# Patient Record
Sex: Female | Born: 1955 | Race: Black or African American | Hispanic: No | Smoking: Never smoker
Health system: Southern US, Community
[De-identification: ages and names within clinical notes are randomized; demographics above are authoritative.]

## PROBLEM LIST (undated history)

## (undated) ENCOUNTER — Ambulatory Visit

## (undated) ENCOUNTER — Telehealth

## (undated) ENCOUNTER — Encounter: Attending: Nephrology | Primary: Nephrology

## (undated) ENCOUNTER — Encounter

## (undated) ENCOUNTER — Ambulatory Visit: Payer: MEDICARE | Attending: Nephrology | Primary: Nephrology

## (undated) ENCOUNTER — Encounter: Attending: Physician Assistant | Primary: Physician Assistant

## (undated) ENCOUNTER — Encounter: Attending: Surgery | Primary: Surgery

## (undated) ENCOUNTER — Ambulatory Visit: Payer: MEDICARE

## (undated) ENCOUNTER — Telehealth
Attending: Student in an Organized Health Care Education/Training Program | Primary: Student in an Organized Health Care Education/Training Program

## (undated) ENCOUNTER — Telehealth: Attending: Certified Registered" | Primary: Certified Registered"

## (undated) ENCOUNTER — Encounter: Attending: Certified Registered" | Primary: Certified Registered"

## (undated) ENCOUNTER — Encounter
Attending: Pharmacist Clinician (PhC)/ Clinical Pharmacy Specialist | Primary: Pharmacist Clinician (PhC)/ Clinical Pharmacy Specialist

## (undated) ENCOUNTER — Encounter
Attending: Student in an Organized Health Care Education/Training Program | Primary: Student in an Organized Health Care Education/Training Program

## (undated) ENCOUNTER — Ambulatory Visit: Payer: Medicare (Managed Care) | Attending: Nephrology | Primary: Nephrology

## (undated) DIAGNOSIS — T82898A Other specified complication of vascular prosthetic devices, implants and grafts, initial encounter: Secondary | ICD-10-CM

## (undated) DIAGNOSIS — Z853 Personal history of malignant neoplasm of breast: Secondary | ICD-10-CM

## (undated) DIAGNOSIS — N649 Disorder of breast, unspecified: Secondary | ICD-10-CM

## (undated) DIAGNOSIS — N042 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: Secondary | ICD-10-CM

## (undated) DIAGNOSIS — K7290 Hepatic failure, unspecified without coma: Secondary | ICD-10-CM

## (undated) DIAGNOSIS — M5136 Other intervertebral disc degeneration, lumbar region: Secondary | ICD-10-CM

## (undated) DIAGNOSIS — M75102 Unspecified rotator cuff tear or rupture of left shoulder, not specified as traumatic: Secondary | ICD-10-CM

## (undated) DIAGNOSIS — K7460 Unspecified cirrhosis of liver: Secondary | ICD-10-CM

## (undated) DIAGNOSIS — I10 Essential (primary) hypertension: Secondary | ICD-10-CM

## (undated) DIAGNOSIS — C801 Malignant (primary) neoplasm, unspecified: Secondary | ICD-10-CM

## (undated) DIAGNOSIS — M549 Dorsalgia, unspecified: Secondary | ICD-10-CM

## (undated) DIAGNOSIS — R600 Localized edema: Secondary | ICD-10-CM

## (undated) DIAGNOSIS — F1010 Alcohol abuse, uncomplicated: Secondary | ICD-10-CM

## (undated) DIAGNOSIS — M51369 Other intervertebral disc degeneration, lumbar region without mention of lumbar back pain or lower extremity pain: Secondary | ICD-10-CM

## (undated) DIAGNOSIS — Z973 Presence of spectacles and contact lenses: Secondary | ICD-10-CM

## (undated) DIAGNOSIS — J189 Pneumonia, unspecified organism: Secondary | ICD-10-CM

## (undated) DIAGNOSIS — G8929 Other chronic pain: Secondary | ICD-10-CM

## (undated) DIAGNOSIS — K5790 Diverticulosis of intestine, part unspecified, without perforation or abscess without bleeding: Secondary | ICD-10-CM

## (undated) DIAGNOSIS — G4730 Sleep apnea, unspecified: Secondary | ICD-10-CM

## (undated) DIAGNOSIS — R9389 Abnormal findings on diagnostic imaging of other specified body structures: Principal | ICD-10-CM

## (undated) DIAGNOSIS — I671 Cerebral aneurysm, nonruptured: Secondary | ICD-10-CM

## (undated) DIAGNOSIS — N183 Chronic kidney disease, stage 3 (moderate): Secondary | ICD-10-CM

## (undated) DIAGNOSIS — R160 Hepatomegaly, not elsewhere classified: Secondary | ICD-10-CM

## (undated) DIAGNOSIS — N052 Unspecified nephritic syndrome with diffuse membranous glomerulonephritis: Secondary | ICD-10-CM

## (undated) HISTORY — DX: Sleep apnea, unspecified: G47.30

## (undated) HISTORY — PX: CATARACT EXTRACTION: SUR2

## (undated) HISTORY — PX: KNEE ARTHROSCOPY: SHX127

## (undated) HISTORY — PX: BRAIN SURGERY: SHX531

## (undated) HISTORY — PX: ROTATOR CUFF REPAIR: SHX139

## (undated) HISTORY — PX: KIDNEY SURGERY: SHX687

## (undated) HISTORY — DX: Abnormal findings on diagnostic imaging of other specified body structures: R93.89

## (undated) HISTORY — DX: Disorder of breast, unspecified: N64.9

## (undated) HISTORY — PX: NEPHRECTOMY TRANSPLANTED ORGAN: SUR880

## (undated) HISTORY — DX: Personal history of malignant neoplasm of breast: Z85.3

## (undated) HISTORY — PX: TOTAL KNEE ARTHROPLASTY: SHX125

## (undated) HISTORY — PX: BREAST SURGERY: SHX581

## (undated) HISTORY — PX: LIVER TRANSPLANT: SHX410

## (undated) HISTORY — DX: Unspecified cirrhosis of liver: K74.60

## (undated) HISTORY — PX: EYE SURGERY: SHX253

## (undated) HISTORY — PX: CHOLECYSTECTOMY: SHX55

---
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
## 1996-10-30 DIAGNOSIS — I671 Cerebral aneurysm, nonruptured: Secondary | ICD-10-CM

## 1996-10-30 HISTORY — DX: Cerebral aneurysm, nonruptured: I67.1

## 1998-01-28 ENCOUNTER — Encounter: Admission: RE | Admit: 1998-01-28 | Discharge: 1998-04-28 | Payer: Self-pay | Admitting: *Deleted

## 2000-10-30 DIAGNOSIS — C801 Malignant (primary) neoplasm, unspecified: Secondary | ICD-10-CM

## 2000-10-30 HISTORY — PX: BREAST LUMPECTOMY: SHX2

## 2000-10-30 HISTORY — DX: Malignant (primary) neoplasm, unspecified: C80.1

## 2001-06-18 ENCOUNTER — Encounter: Admission: RE | Admit: 2001-06-18 | Discharge: 2001-08-22 | Payer: Self-pay | Admitting: *Deleted

## 2001-12-25 ENCOUNTER — Encounter: Admission: RE | Admit: 2001-12-25 | Discharge: 2002-01-14 | Payer: Self-pay | Admitting: Orthopaedic Surgery

## 2004-06-01 ENCOUNTER — Inpatient Hospital Stay (HOSPITAL_COMMUNITY): Admission: EM | Admit: 2004-06-01 | Discharge: 2004-06-02 | Payer: Self-pay | Admitting: Internal Medicine

## 2005-01-02 ENCOUNTER — Ambulatory Visit: Payer: Self-pay | Admitting: Cardiology

## 2005-01-09 ENCOUNTER — Inpatient Hospital Stay (HOSPITAL_BASED_OUTPATIENT_CLINIC_OR_DEPARTMENT_OTHER): Admission: RE | Admit: 2005-01-09 | Discharge: 2005-01-09 | Payer: Self-pay | Admitting: Cardiology

## 2005-01-09 ENCOUNTER — Ambulatory Visit: Payer: Self-pay | Admitting: Cardiology

## 2005-01-26 ENCOUNTER — Ambulatory Visit: Payer: Self-pay | Admitting: Cardiology

## 2007-05-16 ENCOUNTER — Ambulatory Visit (HOSPITAL_COMMUNITY): Admission: RE | Admit: 2007-05-16 | Discharge: 2007-05-16 | Payer: Self-pay | Admitting: Neurosurgery

## 2008-09-17 ENCOUNTER — Ambulatory Visit (HOSPITAL_COMMUNITY): Admission: RE | Admit: 2008-09-17 | Discharge: 2008-09-17 | Payer: Self-pay | Admitting: Ophthalmology

## 2008-11-05 ENCOUNTER — Ambulatory Visit (HOSPITAL_COMMUNITY): Admission: RE | Admit: 2008-11-05 | Discharge: 2008-11-05 | Payer: Self-pay | Admitting: Ophthalmology

## 2008-12-03 ENCOUNTER — Ambulatory Visit (HOSPITAL_COMMUNITY): Admission: RE | Admit: 2008-12-03 | Discharge: 2008-12-04 | Payer: Self-pay | Admitting: Orthopedic Surgery

## 2008-12-09 ENCOUNTER — Encounter: Admission: RE | Admit: 2008-12-09 | Discharge: 2009-03-09 | Payer: Self-pay | Admitting: Orthopedic Surgery

## 2011-02-14 LAB — COMPREHENSIVE METABOLIC PANEL
ALT: 63 U/L — ABNORMAL HIGH (ref 0–35)
Albumin: 4.1 g/dL (ref 3.5–5.2)
CO2: 27 mEq/L (ref 19–32)
Calcium: 9.4 mg/dL (ref 8.4–10.5)
Chloride: 104 mEq/L (ref 96–112)
GFR calc Af Amer: >60 mL/min (ref >60)
GFR calc non Af Amer: >60 mL/min (ref >60)
Potassium: 3.5 mEq/L (ref 3.5–5.1)
Sodium: 140 mEq/L (ref 135–145)

## 2011-02-14 LAB — CBC
RBC: 4.78 MIL/uL (ref 3.87–5.11)
RDW: 14.3 % (ref 11.5–15.5)
WBC: 9 10*3/uL (ref 4.0–10.5)

## 2011-02-14 LAB — URINALYSIS, ROUTINE W REFLEX MICROSCOPIC
Protein, ur: NEGATIVE mg/dL
Urobilinogen, UA: 1 mg/dL (ref 0.0–1.0)
pH: 6.5 (ref 5.0–8.0)

## 2011-03-14 NOTE — Op Note (Signed)
NAMEJESMINE, DURRAH NO.:  [id number]   MEDICAL RECORD NO.:  [id number]          PATIENT TYPE:  OIB   LOCATION:  5041                         FACILITY:  [facility]   PHYSICIAN:  Metta Clines. Supple, M.D.  DATE OF BIRTH:  19-Jun-1956   DATE OF PROCEDURE:  12/03/2008  DATE OF DISCHARGE:                               OPERATIVE REPORT   PREOPERATIVE DIAGNOSES:  1. Chronic left shoulder impingement syndrome.  2. Left shoulder acromioclavicular joint arthrosis.   POSTOPERATIVE DIAGNOSES:  1. Chronic left shoulder impingement syndrome.  2. Left shoulder acromioclavicular joint arthrosis.  3. Partial articular rotator cuff tear.  4. Complex labral tear.   PROCEDURE:  1. Left shoulder examination under anesthesia.  2. Left shoulder diagnostic arthroscopy.  3. Debridement of labral tear.  4. Debridement of partial articular rotator cuff tear.  5. Arthroscopic subacromial depression and bursectomy.  6. Arthroscopic distal clavicle resection.   SURGEON:  Metta Clines. Supple, MD   ASSISTANT:  Jenetta Loges, PA-C   ANESTHESIA:  General endotracheal as well as preop interscalene blocks.   BLOOD LOSS:  Minimal.   DRAINS:  None.   HISTORY:  Ms. Coral is a 55 year old female who has had chronic left  shoulder pain with weakness and restrictions in mobility and symptoms  that have been refractory to attempts at conservative management.  Her  examination reveals painful arc of shoulder motion with positive  impingement sign and radiographs show evidence for [facility] joint arthrosis.  Due to ongoing pain and functional limitation, she is brought to the  operating room at this time for planned left shoulder arthroscopy as  described below.   Preoperatively counseled Ms. Christina on treatment options as well as  risks versus benefits thereof.  Possible surgical complications  including bleeding, infection, neurovascular injury, persistent pain,  loss of motion, anesthetic  complication, and possible need for  additional surgery are reviewed.  She understands and accepts and agrees  with our planned procedure.   PROCEDURE IN DETAIL:  After undergoing routine preop evaluation, the  patient received prophylactic antibiotics and an interscalene block was  established in the holding area by the Anesthesia Department.  Placed on  the operating table and underwent smooth induction of general  endotracheal anesthesia.  Turned to the right lateral decubitus position  on beanbag and appropriately padded and protected.  Left shoulder  examination under anesthesia revealed full passive motion.  There were  no obvious instability patterns.  The left arm suspended at 70 degrees  abduction with 10 pounds of traction.  Left shoulder region was  sterilely prepped and draped in standard fashion.  A posterior portal  was established in the glenohumeral joint and anterior portal was  established under direct visualization.  The glenohumeral articular  surfaces were in good condition.  There was extensive tearing of the  superior half of the labrum, and these areas were debrided with a  shaver.  The biceps tendon was stable with normal caliber and no  evidence for instability proximally and distally.  The rotator cuff  showed a partial articular tear involving the distal insertion of the  supraspinatus.  I would estimate that this constituted 30-40% thickness  of the tendon.  This area was debrided from the articular side to a rim  of healthy appearing tissue.  We did pass a tag suture of 0 PDS at this  point.  Remaining inspection of the glenohumeral joint showed no obvious  additional intra-articular pathology.  At this point, fluid and  instruments were then removed.  The arm was dropped down to 30 degrees  of abduction with the arthroscope introduced in the subacromial space.  Posterior portal and anterolateral portals were established in the  subacromial space.  Abundant  proliferative bursal tissue and multiple  adhesions were identified and these were then divided and excised with  combination the shaver and Arthrex wand.  The wand was then used to  remove the periosteum from the undersurface of the anterior half of the  acromion.  A subacromial depression was then performed with a bur  creating a type 1 morphology.  A portal was then established directly  anterior to the distal clavicle and the distal clavicle resection was  performed with a bur.  Care was taken to make sure that the entire  circumference of the distal clavicle could be visualized to ensure  adequate removal of bone.  Of note, the [facility] joint was markedly  degenerated.  We then completed the subacromial/subdeltoid bursectomy.  We carefully inspected rotator cuff from the bursal side and the region  of the tag suture was carefully probed and did not identify any  significant degeneration and certainly no full-thickness defects and the  probe could not be passed through the cuff.  There was noted to be some  increased vascularity and hemorrhage in the area surrounding the tag  suture but again the thickness of the remaining portion of tissues  suggested good healing potential.  The tag suture was then removed.  We  completed the subacromial/subdeltoid bursectomy.  Hemostasis was  obtained.  Fluid and instruments were removed.  The portals were closed  with Monocryl and Steri-Strips.  A bulky dry dressing taped to the left  foot.  Left arm was placed in sling immobilizer.  The patient was  supine, extubated, and taken to recovery room in stable condition.      Metta Clines. Supple, M.D.  Electronically Signed     KMS/MEDQ  D:  12/03/2008  T:  12/03/2008  Job:  [id number]

## 2011-03-17 NOTE — H&P (Signed)
NAME:  Savannah Jackson, Savannah Jackson NO.:  [id number]   MEDICAL RECORD NO.:  [id number]                   PATIENT TYPE:  INP   LOCATION:  6524                                 FACILITY:  [facility]   PHYSICIAN:  Deboraha Sprang, M.D.               DATE OF BIRTH:  October 24, 1956   DATE OF ADMISSION:  06/01/2004  DATE OF DISCHARGE:                                HISTORY & PHYSICAL   HISTORY OF PRESENT ILLNESS:  Savannah Jackson is referred from [facility] because of atypical chest pain and abnormal electrocardiogram.   Savannah Jackson is a 55 year old lady with cardiac risk factors notable for  hypertension, in the context of significant obesity, who presents with the  abrupt onset today of severe chest pain, noted to be epigastric and mid-  sternal, radiating around the left side to her back.  It is markedly  aggravated by movement and respirations, and in fact, if she lies perfectly  still, it is absent.  It is accompanied by shortness of breath.  She had  significant chest pain on exertion.  However, at baseline, accompanied only  by mild peripheral edema.   She was admitted to the hospital.  There her electrocardiogram has had some  changes which actually have improved as the day has gone one, perhaps  parallel in her improved blood pressure and potentially causative  associated.  She ruled out for myocardial infarction at least on her initial  enzymes.   PAST MEDICAL HISTORY:  Notable for a brain aneurysm for which she underwent  surgery in 1995.  She has had knee replacement surgery, and a couple of  other knee surgeries.  She has had a history of a cholecystectomy.  She also  has a history of breast cancer.  She is status post left lobectomy, followed  by chemotherapy and radiation therapy.   REVIEW OF SYSTEMS:  Notable for symptoms of sleep apnea and arthritis.  She  has some tingling in her right greater than left hand.   SOCIAL HISTORY:  She is married.  She  has one child and four grandchildren.  She does not use cigarettes or recreational drugs.  She drinks socially.   ALLERGIES:  She has no known allergies.   MEDICATIONS:  She does not take any medications on a regular basis.   PHYSICAL EXAMINATION:  VITAL SIGNS:  Her blood pressure is 120/72, pulses  69, respirations 14.  She is 180 pounds. She is 5 feet, 3 inches.  She is in  no acute distress.  GENERAL:  She is a middle-aged [place] American female.  HEENT:  Demonstrated no icterus or xanthomas.  NECK:  The neck veins were flat.  The carotids were brisk and full  bilaterally without bruits.  BACK:  Without kyphosis or scoliosis.  LUNGS:  Clear.  HEART:  Sounds were regular without murmurs or gallops.  ABDOMEN:  Soft with active bowel sounds  without midline pulsation or  hepatomegaly.  PULSES:  The femoral pulses were 2+.  Distal pulses were intact.  EXTREMITIES:  There was no clubbing, cyanosis or edema.  There as a scar  over her right knee.  NEUROLOGIC:  Exam was grossly normal.   STUDIES:  Electrocardiogram demonstrates sinus rhythm at 59 __________ 0.19/  0.09/ 0.43.  This was electrocardiogram obtained at 1848 hours.  There is  evidence of left ventricular hypertrophy and repolarization abnormalities.  As noted, the electrocardiographic abnormalities were significant worse upon  presentation at 9 o'clock this morning, with gradual improvement, but still  markedly abnormal with a strain pattern.   LABORATORY DATA:  Notable for a glucose of only 112.  Her enzymes were  normal as noted.  These were obtained at 15:10.  I presume that her first  set of enzymes at 9 o'clock were normal, and indeed, that turns out to be  the case.  Her hemogram was normal.   IMPRESSION:  1. Atypical chest pain evident only with movement and respiration.  2. Thromboembolic risk factors.     A. Recent car trip.     B. Remote breast cancer.     C. Relative inactivity.  3. Question metabolic  syndrome.     A. Obesity.     B. Hypertension.     C. Question HDL.  4. Probable sleep apnea.  5. Abnormal electrocardiogram with LVH with repolarization.  6. History of cerebral aneurysm, status post clipping.  7. Dyspnea on exertion.   PROGNOSIS:  Savannah Jackson has a variety of issues, the most important of  which tonight is what is the cause of her chest pain, and does she have  pulmonary embolism.   We will undertake a CT scan tonight as I am a little bit reluctant to use  anticoagulation with her cerebral aneurysm clipping history, although I have  spoken with neurosurgery, and they said that there should be no  contraindications.   We will also plan to measure an HDL to see if she fits criteria for the  metabolic syndrome.   Upon discharge, she will need evaluation for obstructive sleep apnea.  We  obtained an outpatient echocardiogram and a Cardiolite to look at function  and perfusion, and degree of hypertrophy.                                                Deboraha Sprang, M.D.    SCK/MEDQ  D:  06/01/2004  T:  06/02/2004  Job:  [id number]   cc:   [name] Bears  [place]  [place] [address]  Fax: [place]

## 2011-03-17 NOTE — Discharge Summary (Signed)
Savannah Jackson, Savannah Jackson NO.:  [id number]   MEDICAL RECORD NO.:  [id number]                   PATIENT TYPE:  INP   LOCATION:  [id number]                                 FACILITY:  [facility]   PHYSICIAN:  Deboraha Sprang, M.D.               DATE OF BIRTH:  02-26-1956   DATE OF ADMISSION:  06/01/2004  DATE OF DISCHARGE:  06/02/2004                           DISCHARGE SUMMARY - REFERRING   PROCEDURE:  Chest CT scan.   REASON FOR ADMISSION:  Please refer to dictated admission note.   LABORATORY DATA:  CPK 74/1.2, troponin I 0.02, sodium 132, potassium 3.3,  glucose 100, BUN 9, creatinine 1.0.  CBC normal. D-dimer 0.29.  Fasting  lipids pending. Elevated ALT 43 and AST 52, otherwise normal liver enzymes.   HOSPITAL COURSE:  Following transfer from [facility] where patient  presented with atypical chest pain exacerbated by movement and inspiration  in the context of negative serial cardiac markers, but abnormal  electrocardiograms, the patient was maintained on medication regimen  consisting of aspirin, beta blocker and Mavik.   Dr. Caryl Comes recommended further studies consisting of a CT scan of the chest.  This was negative for pulmonary embolism.  A D-dimer was also negative.  Followup with serial cardiac markers remained negative as well.   Mild hypokalemia was noted and repleted prior to discharge.   Further evaluation will proceed as recommended by Caryl Comes and consist of a 2-D  echocardiogram for exclusion of left ventricular hypertrophy, stress  Cardiolite, and arrangements for outpatient sleep study to assess for  possible sleep apnea.   Dr. Caryl Comes also wondered whether the patient has metabolic syndrome given her  hypertension and obesity.  A fasting lipid profile was pending.   The patient will also need followup of mildly elevated liver enzymes.   DISCHARGE MEDICATIONS:  1. Mavik 1 mg daily.  2. Lopressor 25 mg b.i.d.   INSTRUCTIONS:  1. Followup  with Gene Serpe, P.A.-C at [facility] in [place] on Friday     August 12 at 1 p.m.  2. Arrangements will be made for patient to have an outpatient 2-D     echocardiogram, stress Cardiolite, complete metabolic panel for followup     of hypokalemia and elevated liver enzymes.  3. The patient is instructed to arrange followup with Dr. Elita Boone in     approximately two weeks with whom she will establish as her primary care     physician.   DISCHARGE DIAGNOSES:  1. Atypical chest pain.  2. Hypertension.  3. Hypokalemia.  4. Mildly elevated liver enzymes.  5. Obesity.  6. Question of sleep apnea.      Gene Serpe, P.A. [facility]                      Deboraha Sprang, M.D.    GS/MEDQ  D:  06/02/2004  T:  06/02/2004  Job:  [id number]   cc:   [name] Bears  [address]  [place]  [place] [address]  Fax: [phone]

## 2011-03-17 NOTE — Discharge Summary (Signed)
NAMEMACII, ARD NO.:  [id number]   MEDICAL RECORD NO.:  [id number]                   PATIENT TYPE:  INP   LOCATION:  [id number]                                 FACILITY:  [facility]   PHYSICIAN:  Deboraha Sprang, M.D.               DATE OF BIRTH:  04-01-1956   DATE OF ADMISSION:  06/01/2004  DATE OF DISCHARGE:  06/02/2004                           DISCHARGE SUMMARY - REFERRING   ADDENDUM   Forward a copy of Job [id number] to [facility] in [place], [address] [place], [place], [place], [employer].      Gene Serpe, P.A. [facility]                      Deboraha Sprang, M.D.    GS/MEDQ  D:  06/02/2004  T:  06/02/2004  Job:  [id number]

## 2011-03-17 NOTE — Cardiovascular Report (Signed)
Savannah Jackson, CASTLES NO.:  [id number]   MEDICAL RECORD NO.:  [id number]          PATIENT TYPE:  OIB   LOCATION:  6501                         FACILITY:  [facility]   PHYSICIAN:  Ernestine Mcmurray, M.D. LHCDATE OF BIRTH:  [birth year]   DATE OF PROCEDURE:  01/09/2005  DATE OF DISCHARGE:  01/09/2005                              CARDIAC CATHETERIZATION   REFERRING PHYSICIAN:  Satira Sark, M.D.   OPERATOR:  Ernestine Mcmurray, M.D.   PROCEDURES PERFORMED:  1.  Left and right heart catheterization with selective coronary      angiography.  2.  Ventriculography.   DIAGNOSES:  1.  No evidence of significant flow-limiting epicardial coronary artery      disease.  2.  Mild pulmonary hypertension.  3.  Hypertensive heart disease.   INDICATIONS:  Patient is a 55 year old female with a history of progressive  dyspnea. The patient has been referred for a cardiac catheterization to rule  out underlying coronary artery disease and pulmonary hypertension.   DESCRIPTION OF PROCEDURE:  After informed consent was signed, the patient  was brought to the catheterization lab.  The right groin was sterilely  prepped and draped.  A 4 French arterial sheath was placed using modified  Seldinger technique.  A 7 French venous sheath was passed into place, using  the same technique.  Left heart catheterization was performed with a 4  [place] JL4 and JR4 catheters using manual injection of contrast.  Coronary  arteries were evaluated in multiple projections.  The right heart  catheterization was performed with a standard Swan-Ganz catheter.  Right-  sided hemodynamics were obtained as well as assessment of cardiac output  using thermodilution method.  At the termination of the procedure, all  catheter sheaths were removed.  The patient was brought back to the recovery  area.  No complications were encountered.   FINDINGS:  1.  HEMODYNAMICS:  Right atrial pressure 5 mmHg.  Right ventricular  pressure      35/4 mmHg.  Pulmonary artery pressure 75/20 mmHg.  Pulmonary capillary      wedge pressure 15 mmHg.  Left ventricular pressure 153/26 mmHg.  Aortic      pressure 156/96 mmHg.  Cardiac outflow 1.6 liters per minute.  Cardiac      index 2.95 liters per minute using thermodilution method.  2.  VENTRICULOGRAPHY:  Ejection fraction 60% with segmental wall motion      abnormalities.  No mitral regurgitation.  3.  SELECTIVE CORONARY ANGIOGRAPHY:  The coronary circulation was      codominant.   1.  Left main coronary artery was a large caliber vessel with no evidence of      filling disease.  2.  LAD had no focal stenosis.  The diagonal branches were also free of      focal stenotic lesions.  3.  The circumflex coronary artery demonstrated no evidence of focal or      diffuse stenosis.  4.  The right coronary artery was a large caliber vessel, codominant.  There      was no evidence of flow-limiting lesion  in the distribution of the right      coronary artery.   RECOMMENDATIONS:  The patient will need maximal treatment for hypertension.  She appears to have hypertensive heart disease.  Also, obstructive sleep  apnea should be ruled out.  Followup will be provided by Dr. Woody Seller.  There is  no evidence of significant flow-limiting epicardial coronary artery disease.  Nor is the patient's pulmonary hypertension an immediate cause for her  dyspnea.       GED/MEDQ  D:  05/04/2005  T:  05/04/2005  Job:  [id number]   cc:   Satira Sark, M.D. [facility]

## 2011-08-01 LAB — BASIC METABOLIC PANEL
CO2: 31
Calcium: 9.8
Chloride: 98
GFR calc non Af Amer: >60
Glucose, Bld: 112 — ABNORMAL HIGH

## 2011-08-01 LAB — HEMOGLOBIN AND HEMATOCRIT, BLOOD
HCT: 46.2 — ABNORMAL HIGH
Hemoglobin: 15.6 — ABNORMAL HIGH

## 2011-08-01 LAB — GLUCOSE, CAPILLARY: Glucose-Capillary: 125 — ABNORMAL HIGH

## 2011-08-04 LAB — BASIC METABOLIC PANEL
CO2: 30 mEq/L (ref 19–32)
Calcium: 9.2 mg/dL (ref 8.4–10.5)
Chloride: 99 mEq/L (ref 96–112)
GFR calc Af Amer: >60 mL/min (ref >60)
GFR calc non Af Amer: >60 mL/min (ref >60)
Sodium: 137 mEq/L (ref 135–145)

## 2012-04-02 ENCOUNTER — Encounter (HOSPITAL_COMMUNITY): Payer: Self-pay | Admitting: *Deleted

## 2012-04-02 ENCOUNTER — Emergency Department (HOSPITAL_COMMUNITY)
Admission: EM | Admit: 2012-04-02 | Discharge: 2012-04-02 | Disposition: A | Discharge status: Home / Self Care | Payer: Medicare PPO | Attending: Emergency Medicine | Admitting: Emergency Medicine

## 2012-04-02 ENCOUNTER — Emergency Department (HOSPITAL_COMMUNITY): Payer: Medicare PPO

## 2012-04-02 DIAGNOSIS — I10 Essential (primary) hypertension: Secondary | ICD-10-CM | POA: Insufficient documentation

## 2012-04-02 DIAGNOSIS — M545 Low back pain, unspecified: Secondary | ICD-10-CM

## 2012-04-02 DIAGNOSIS — R112 Nausea with vomiting, unspecified: Secondary | ICD-10-CM | POA: Insufficient documentation

## 2012-04-02 HISTORY — DX: Essential (primary) hypertension: I10

## 2012-04-02 HISTORY — DX: Unspecified rotator cuff tear or rupture of left shoulder, not specified as traumatic: M75.102

## 2012-04-02 HISTORY — DX: Other intervertebral disc degeneration, lumbar region without mention of lumbar back pain or lower extremity pain: M51.369

## 2012-04-02 HISTORY — DX: Dorsalgia, unspecified: M54.9

## 2012-04-02 HISTORY — DX: Cerebral aneurysm, nonruptured: I67.1

## 2012-04-02 HISTORY — DX: Other chronic pain: G89.29

## 2012-04-02 HISTORY — DX: Other intervertebral disc degeneration, lumbar region: M51.36

## 2012-04-02 HISTORY — DX: Malignant (primary) neoplasm, unspecified: C80.1

## 2012-04-02 MED ORDER — ONDANSETRON 8 MG PO TBDP
8.0000 mg | ORAL_TABLET | Freq: Once | ORAL | Status: AC
  Administered 2012-04-02: 8 mg via ORAL
  Filled 2012-04-02: qty 1

## 2012-04-02 MED ORDER — ONDANSETRON HCL 4 MG PO TABS: 4.0000 mg | ORAL_TABLET | Freq: Three times a day (TID) | ORAL | Status: AC | PRN

## 2012-04-02 MED ORDER — OXYCODONE-ACETAMINOPHEN 5-325 MG PO TABS: ORAL_TABLET | ORAL | Status: AC

## 2012-04-02 MED ORDER — DIPHENHYDRAMINE HCL 25 MG PO CAPS
50.0000 mg | ORAL_CAPSULE | Freq: Once | ORAL | Status: AC
  Administered 2012-04-02: 50 mg via ORAL
  Filled 2012-04-02: qty 2

## 2012-04-02 MED ORDER — METHOCARBAMOL 500 MG PO TABS: 1000.0000 mg | ORAL_TABLET | Freq: Four times a day (QID) | ORAL | Status: AC | PRN

## 2012-04-02 MED ORDER — DIAZEPAM 5 MG PO TABS
5.0000 mg | ORAL_TABLET | Freq: Once | ORAL | Status: AC
  Administered 2012-04-02: 5 mg via ORAL
  Filled 2012-04-02: qty 1

## 2012-04-02 MED ORDER — MORPHINE SULFATE 4 MG/ML IJ SOLN
4.0000 mg | Freq: Once | INTRAMUSCULAR | Status: AC
  Administered 2012-04-02: 4 mg via INTRAMUSCULAR
  Filled 2012-04-02 (×2): qty 1

## 2012-04-02 MED ORDER — OXYCODONE-ACETAMINOPHEN 5-325 MG PO TABS
2.0000 | ORAL_TABLET | Freq: Once | ORAL | Status: AC
  Administered 2012-04-02: 2 via ORAL
  Filled 2012-04-02: qty 2

## 2012-04-02 NOTE — ED Provider Notes (Signed)
History   This chart was scribed for Alfonzo Feller, DO by Kathreen Cornfield. The patient was seen in room APA18/APA18 and the patient's care was started at 11:00 AM     CSN: [id number]  Arrival date & time 04/02/12  1006   First MD Initiated Contact with Patient 04/02/12 1049      Chief Complaint  Patient presents with  . Back Pain    HPI  Savannah Jackson is a 56 y.o. female who presents to the Emergency Department complaining of gradual onset and persistence of constant acute flair of her chronic low back "pain" for the past several days.  Denies any change in her usual chronic pain pattern for the past several years.  States she has had several episodes of N/V that began after taking her pain medicine (naprosyn) "on an empty stomach."  Denies incont/retention of bowel or bladder, no saddle anesthesia, no focal motor weakness, no tingling/numbness in extremities, no fevers, no injury, no abd pain, no diarrhea, no dysuria/hematuria.   The symptoms have been associated with no other complaints. The patient has a significant history of similar symptoms previously.      PCP is Dr. Legrand Rams.    Past Medical History  Diagnosis Date  . Hypertension   . Brain aneurysm   . Cancer   . Rotator cuff syndrome of left shoulder   . DDD (degenerative disc disease), lumbar   . Chronic back pain     Past Surgical History  Procedure Date  . Cholecystectomy   . Brain surgery   . Breast surgery   . Total knee arthroplasty     right      History  Substance Use Topics  . Smoking status: Never Smoker   . Smokeless tobacco: Not on file  . Alcohol Use: Yes     occasioinally    Review of Systems ROS: Statement: All systems negative except as marked or noted in the HPI; Constitutional: Negative for fever and chills. ; ; Eyes: Negative for eye pain, redness and discharge. ; ; ENMT: Negative for ear pain, hoarseness, nasal congestion, sinus pressure and sore throat. ; ; Cardiovascular: Negative  for chest pain, palpitations, diaphoresis, dyspnea and peripheral edema. ; ; Respiratory: Negative for cough, wheezing and stridor. ; ; Gastrointestinal: +N/V. Negative for diarrhea, abdominal pain, blood in stool, hematemesis, jaundice and rectal bleeding. . ; ; Genitourinary: Negative for dysuria, flank pain and hematuria. ; ; Musculoskeletal: +LBP. Negative for neck pain. Negative for swelling and trauma.; ; Skin: Negative for pruritus, rash, abrasions, blisters, bruising and skin lesion.; ; Neuro: Negative for headache, lightheadedness and neck stiffness. Negative for weakness, altered level of consciousness , altered mental status, extremity weakness, paresthesias, involuntary movement, seizure and syncope.     Allergies  Review of patient's allergies indicates no known allergies.  Home Medications   Current Outpatient Rx  Name Route Sig Dispense Refill  . HYDROCHLOROTHIAZIDE 25 MG PO TABS Oral Take 25 mg by mouth 2 (two) times daily.    Marland Kitchen NAPROXEN SODIUM 220 MG PO TABS Oral Take 220 mg by mouth every 8 (eight) hours as needed. For pain    . POLYVINYL ALCOHOL 1.4 % OP SOLN Both Eyes Place 1 drop into both eyes as needed. For dry eyes    . METHOCARBAMOL 500 MG PO TABS Oral Take 2 tablets (1,000 mg total) by mouth 4 (four) times daily as needed (muscle spasm/pain). 25 tablet 0  . OXYCODONE-ACETAMINOPHEN 5-325 MG PO TABS  1 or 2 tabs PO q6h prn pain 20 tablet 0    BP 178/89  Pulse 63  Temp(Src) 98.2 F (36.8 C) (Oral)  Resp 18  Ht 5\' 3"  (1.6 m)  Wt 157 lb (71.215 kg)  BMI 27.81 kg/m2  SpO2 91%  Physical Exam 1105: Physical examination:  Nursing notes reviewed; Vital signs and O2 SAT reviewed;  Constitutional: Well developed, Well nourished, Well hydrated, In no acute distress; Head:  Normocephalic, atraumatic; Eyes: EOMI, PERRL, No scleral icterus; ENMT: Mouth and pharynx normal, Mucous membranes moist; Neck: Supple, Full range of motion, No lymphadenopathy; Cardiovascular: Regular  rate and rhythm, No murmur or gallop; Respiratory: Breath sounds clear & equal bilaterally, No rales, rhonchi, wheezes, speaking full sentences with ease. Normal respiratory effort/excursion; Chest: Nontender, Movement normal; Abdomen: Soft, Nontender, Nondistended, Normal bowel sounds; Genitourinary: No CVA tenderness;  Spine:  No midline CS, TS, LS tenderness.  +TTP right lower lumbar paraspinal muscles and SI joint areas which reproduces pt's pain.; Extremities: Pulses normal, No tenderness, No edema, No calf edema or asymmetry.; Neuro: AA&Ox3, Major CN grossly intact. Strength 5/5 equal bilat UE's and LE's, including great toe dorsiflexion.  DTR 2/4 equal bilat UE's and LE's.  No gross sensory deficits.  Neg straight leg raises bilat.; Skin: Color normal, Warm, Dry   ED Course  Procedures    MDM  MDM Reviewed: nursing note, vitals and previous chart Reviewed previous: CT scan Interpretation: x-ray   Dg Lumbar Spine Complete 04/02/2012  *RADIOLOGY REPORT*  Clinical Data: Low back pain radiating to right hip  LUMBAR SPINE - COMPLETE 4+ VIEW  Comparison: None Correlation:  Lumbar spine myelography 05/16/2007  Findings: Five non-rib bearing lumbar vertebrae. Question slightly decreased osseous mineralization. Mild facet degenerative changes lower lumbar spine. Straightening of lumbar lordosis. Vertebral body heights maintained. Minimal disc space narrowing and endplate spur formation L3-L4. No acute fracture, subluxation or bone destruction. SI joints symmetric. No spondylolysis identified.  IMPRESSION: Minimal degenerative changes lower lumbar spine. No acute osseous findings.  Original Report Authenticated By: Burnetta Sabin, M.D.       2:22 PM:  Pt feels "better now" and wants to go home.  Pt is now sitting up on the stretcher, taking PO without difficulty after initially vomiting pain meds and valium.  Pt has long hx of chronic LBP with EPIC chart review revealing that pt was seen by  Neurosurgeon Cabbell and had CT myelogram in 2008 for this same LBP.  Pt endorses this pain is per her usual today.  No concerning signs of cauda equina.  Dx testing d/w pt and family.  Questions answered.  Verb understanding, agreeable to d/c home with outpt f/u.       I personally performed the services described in this documentation, which was scribed in my presence. The recorded information has been reviewed and considered. [place], DO 04/05/12 1231

## 2012-04-02 NOTE — Discharge Instructions (Signed)
RESOURCE GUIDE  Chronic Pain Problems: Contact [facility]  [phone] Patients need to be referred by their primary care doctor.  Insufficient Money for Medicine: Contact United Way:  call "211" or [facility] [phone].  No Primary Care Doctor: - Call [facility]  [phone] - can help you locate a primary care doctor that  accepts your insurance, provides certain services, etc. - Physician Referral Service- 1-800-533-3463  Agencies that provide inexpensive medical care: - [facility]  [phone] - [facility]  [phone] - [facility]  [phone] - [facility]  [phone] - Planned Parenthood  [phone] - [facility]  [phone]  Medicaid-accepting [place] Providers: - [facility]- [address]  [phone], Mon-Fri 9am-7pm, Sat 9am-1pm - [facility]- [address], [address]  [phone] - [facility]- [address], [address]  [phone] - [facility]- [address] [place] [address]  [phone] - Veita Bland- [address], [phone]  Only accepts [place] Access Medicaid patients after they have their name  applied to their card  Self Pay (no insurance) in [place]: - Sickle Cell Patients: Dr Eric Dean, [facility]  [address], [phone] - [facility]- [address]  [phone]       -     [facility] [place]- [address]       -     [facility]- see information above (Speak to Pam H if you do not have insurance)       -  [facility]- [address], [phone]       -  [facility] [place]- [address],  [phone]       -  [facility]- [address] [place] [address], [phone]       -  Dr Osei-Bonsu-  [address], [place], [phone]       -  [facility]- [address], [phone]       -  [facility]- [address] [place] [address], [phone], also [address], [phone]       -    [facility]- [address], [phone], 1st & 3rd Saturday   every month, 10am-1pm  1) Find a Doctor and Pay Out of Pocket Although you won't have to find out who is covered by your insurance plan, it is a good idea to ask around and get recommendations. You will then need to call the office and see if the doctor you have chosen will accept you as a new patient and what types of options they offer for patients who are self-pay. Some doctors offer discounts or will set up payment plans for their patients who do not have insurance, but you will need to ask so you aren't surprised when you get to your appointment.  2) Contact Your Local Health Department Not all health departments have doctors that can see patients for sick visits, but many do, so it is worth a call to see if yours does. If you don't know where your local health department is, you can check in your phone book. The CDC also has a tool to help you locate your state's health department, and many state websites also have   listings of all of their local health departments.  3) Find a Walk-in Clinic If your illness is not likely to be very severe or complicated, you may want to try a walk in clinic. These are popping up all over the country in pharmacies, drugstores, and shopping centers. They're usually staffed by nurse practitioners or physician assistants that have been trained to treat common illnesses and complaints. They're usually fairly quick and inexpensive. However, if you have serious medical issues or chronic medical problems, these are probably not your best option  STD Testing - [place] [facility] [place], [facility] Clinic, [address], [place], phone [phone] or 1-877-539-9860.  Monday - Friday, call for an appointment. - [place]  [facility] [place], [facility]D Clinic, [address], [place], phone [phone] or 1-877-539-9860.  Monday - Friday, call for an appointment.  Abuse/Neglect: - [place] Child Abuse Hotline (336) 641-3795 - [place] Child Abuse Hotline 800-378-5315 (After Hours)  Emergency Shelter:  [facility] (336) 271-5985  Maternity Homes: - [facility] (336) 275-9566 - [facility] (704) 372-4663  MRSA Hotline #:   832-7006  [place] Resources  [facility] [place]  [employer] [place] [facility] [address]                 [address]         [address]  [place]                                               [place]                              [place] Phone:  [phone]                                  Phone:  [phone]                   Phone:  [phone]  [place] [facility], [phone] - [place] Services - [facility]- 1-888-581-9988       -     [facility] in [place], [address],                                  336-349-4454, Insurance  [place] Child Abuse Hotline (336) 342-1394 or (336) 342-3537 (After Hours)   Behavioral Health Services  Substance Abuse Resources: - [facility]  336-882-2125 - [facility] 336-784-9470 - [facility] 336-285-9073 - [facility] 336-845-3988 - Residential & Outpatient Substance Abuse Program  800-659-3381  Psychological Services: - [facility]  832-9600 - [facility]  [phone] - [place] [facility], [address], [place], ACCESS LINE: 1-800-853-5163 or 336-641-4981, Http://www.[facility].com/services/adult.htm  Dental Assistance  If unable to pay or uninsured, contact:  [facility] or [place] [facility] to become qualified for the adult dental  clinic.  Patients with Medicaid: [facility] [address], [phone], [phone]  If unable   to pay, or uninsured, contact [facility] 3161752786) or [facility] (979)042-0832 in [place], [place] in [facility]) to become qualified for the adult dental clinic  Other [place]- [place], [place], [place], [address], [place], [place], 2nd and 4th Thursday of the month at 6:30am.  10 clients each day by appointment, can sometimes see walk-in patients if someone does not show for an appointment. [facility]- [address] [place], [place], [address], [place], [place], [place], [address], [place] Department- [place] [phone]     Take the prescriptions as directed.  Apply moist heat or ice to the area(s) of discomfort, for 15 minutes at a time, several times per day for the next few days.  Do not fall asleep on a heating or ice pack.  Call your regular medical doctor and your Neurosurgeon today to schedule a follow up appointment this week.  Return to the Emergency Department immediately if worsening.

## 2012-04-02 NOTE — ED Notes (Signed)
Patient vomited while getting up to get in wheelchair. EDP made aware, prescription given for zofran.

## 2012-04-02 NOTE — ED Notes (Signed)
Patient c/o itching. Patient states "I think that morphine is causing it." EDP made aware, order given.

## 2012-04-02 NOTE — ED Notes (Signed)
Pt c/o lower back pain radiating down to her right hip since yesterday. Denies injury.

## 2012-04-02 NOTE — ED Notes (Signed)
Patient actively vomiting small amount of clear emesis. EDP made aware, verbal order given.

## 2012-05-08 ENCOUNTER — Other Ambulatory Visit (HOSPITAL_COMMUNITY): Payer: Self-pay | Admitting: Internal Medicine

## 2012-05-08 DIAGNOSIS — M549 Dorsalgia, unspecified: Secondary | ICD-10-CM

## 2012-05-13 ENCOUNTER — Ambulatory Visit (HOSPITAL_COMMUNITY): Payer: Medicare HMO

## 2012-07-15 ENCOUNTER — Emergency Department (HOSPITAL_COMMUNITY): Payer: Medicare HMO

## 2012-07-15 ENCOUNTER — Encounter (HOSPITAL_COMMUNITY): Payer: Self-pay

## 2012-07-15 ENCOUNTER — Inpatient Hospital Stay (HOSPITAL_COMMUNITY)
Admission: EM | Admit: 2012-07-15 | Discharge: 2012-07-18 | DRG: 433 | Disposition: A | Discharge status: Home / Self Care | Payer: Medicare HMO | Attending: Internal Medicine | Admitting: Internal Medicine

## 2012-07-15 DIAGNOSIS — F411 Generalized anxiety disorder: Secondary | ICD-10-CM | POA: Diagnosis present

## 2012-07-15 DIAGNOSIS — E876 Hypokalemia: Secondary | ICD-10-CM | POA: Diagnosis present

## 2012-07-15 DIAGNOSIS — K7030 Alcoholic cirrhosis of liver without ascites: Principal | ICD-10-CM | POA: Diagnosis present

## 2012-07-15 DIAGNOSIS — Z23 Encounter for immunization: Secondary | ICD-10-CM

## 2012-07-15 DIAGNOSIS — R112 Nausea with vomiting, unspecified: Secondary | ICD-10-CM

## 2012-07-15 DIAGNOSIS — K7010 Alcoholic hepatitis without ascites: Secondary | ICD-10-CM | POA: Diagnosis present

## 2012-07-15 DIAGNOSIS — F10939 Alcohol use, unspecified with withdrawal, unspecified: Secondary | ICD-10-CM

## 2012-07-15 DIAGNOSIS — F10239 Alcohol dependence with withdrawal, unspecified: Secondary | ICD-10-CM

## 2012-07-15 DIAGNOSIS — Z7982 Long term (current) use of aspirin: Secondary | ICD-10-CM

## 2012-07-15 DIAGNOSIS — M51379 Other intervertebral disc degeneration, lumbosacral region without mention of lumbar back pain or lower extremity pain: Secondary | ICD-10-CM | POA: Diagnosis present

## 2012-07-15 DIAGNOSIS — Z96659 Presence of unspecified artificial knee joint: Secondary | ICD-10-CM

## 2012-07-15 DIAGNOSIS — Z91040 Latex allergy status: Secondary | ICD-10-CM

## 2012-07-15 DIAGNOSIS — I10 Essential (primary) hypertension: Secondary | ICD-10-CM | POA: Diagnosis present

## 2012-07-15 DIAGNOSIS — F1020 Alcohol dependence, uncomplicated: Secondary | ICD-10-CM | POA: Diagnosis present

## 2012-07-15 DIAGNOSIS — M5137 Other intervertebral disc degeneration, lumbosacral region: Secondary | ICD-10-CM | POA: Diagnosis present

## 2012-07-15 LAB — CBC WITH DIFFERENTIAL/PLATELET
Eosinophils Relative: 0 % (ref 0–5)
HCT: 46.8 % — ABNORMAL HIGH (ref 36.0–46.0)
Lymphocytes Relative: 9 % — ABNORMAL LOW (ref 12–46)
Lymphs Abs: 0.6 10*3/uL — ABNORMAL LOW (ref 0.7–4.0)
MCH: 31.1 pg (ref 26.0–34.0)
MCV: 89 fL (ref 78.0–100.0)
Monocytes Relative: 7 % (ref 3–12)
Neutro Abs: 5.7 10*3/uL (ref 1.7–7.7)
Neutrophils Relative %: 82 % — ABNORMAL HIGH (ref 43–77)
Platelets: 106 10*3/uL — ABNORMAL LOW (ref 150–400)
Platelets: 102 10*3/uL — ABNORMAL LOW (ref 150–400)
RBC: 4.82 MIL/uL (ref 3.87–5.11)
RDW: 14.7 % (ref 11.5–15.5)
WBC: 7 10*3/uL (ref 4.0–10.5)
WBC: 6.6 10*3/uL (ref 4.0–10.5)

## 2012-07-15 LAB — BASIC METABOLIC PANEL
CO2: 24 mEq/L (ref 19–32)
Calcium: 9 mg/dL (ref 8.4–10.5)
Chloride: 99 mEq/L (ref 96–112)
Creatinine, Ser: 0.49 mg/dL — ABNORMAL LOW (ref 0.50–1.10)
GFR calc Af Amer: >90 mL/min (ref >90)
Glucose, Bld: 168 mg/dL — ABNORMAL HIGH (ref 70–99)

## 2012-07-15 LAB — URINALYSIS, ROUTINE W REFLEX MICROSCOPIC
Ketones, ur: NEGATIVE mg/dL
Leukocytes, UA: NEGATIVE
Nitrite: NEGATIVE
Protein, ur: 100 mg/dL — AB
Urobilinogen, UA: 2 mg/dL — ABNORMAL HIGH (ref 0.0–1.0)

## 2012-07-15 LAB — COMPREHENSIVE METABOLIC PANEL
AST: 118 U/L — ABNORMAL HIGH (ref 0–37)
Albumin: 3.1 g/dL — ABNORMAL LOW (ref 3.5–5.2)
Calcium: 8.9 mg/dL (ref 8.4–10.5)
Creatinine, Ser: 0.49 mg/dL — ABNORMAL LOW (ref 0.50–1.10)
GFR calc non Af Amer: >90 mL/min (ref >90)
Total Protein: 7.5 g/dL (ref 6.0–8.3)

## 2012-07-15 LAB — URINE MICROSCOPIC-ADD ON

## 2012-07-15 MED ORDER — HYDROMORPHONE HCL PF 1 MG/ML IJ SOLN
1.0000 mg | Freq: Once | INTRAMUSCULAR | Status: AC
  Administered 2012-07-15: 1 mg via INTRAVENOUS
  Filled 2012-07-15: qty 1

## 2012-07-15 MED ORDER — POTASSIUM CHLORIDE 10 MEQ/100ML IV SOLN
10.0000 meq | Freq: Once | INTRAVENOUS | Status: AC
  Administered 2012-07-15: 10 meq via INTRAVENOUS
  Filled 2012-07-15: qty 100

## 2012-07-15 MED ORDER — LORAZEPAM 1 MG PO TABS: 0.0000 mg | ORAL_TABLET | Freq: Two times a day (BID) | ORAL | Status: DC

## 2012-07-15 MED ORDER — METOPROLOL TARTRATE 50 MG PO TABS
50.0000 mg | ORAL_TABLET | Freq: Two times a day (BID) | ORAL | Status: DC
  Administered 2012-07-15 – 2012-07-18 (×6): 50 mg via ORAL
  Filled 2012-07-15 (×6): qty 1

## 2012-07-15 MED ORDER — LORAZEPAM 1 MG PO TABS
0.0000 mg | ORAL_TABLET | Freq: Four times a day (QID) | ORAL | Status: AC
  Administered 2012-07-15: 2 mg via ORAL
  Administered 2012-07-15 – 2012-07-17 (×6): 1 mg via ORAL
  Filled 2012-07-15 (×5): qty 1
  Filled 2012-07-15: qty 2
  Filled 2012-07-15: qty 1

## 2012-07-15 MED ORDER — HYDROMORPHONE HCL PF 1 MG/ML IJ SOLN
1.0000 mg | INTRAMUSCULAR | Status: AC | PRN
  Administered 2012-07-15: 1 mg via INTRAVENOUS
  Filled 2012-07-15: qty 1

## 2012-07-15 MED ORDER — LORAZEPAM 2 MG/ML IJ SOLN
1.0000 mg | Freq: Once | INTRAMUSCULAR | Status: AC
  Administered 2012-07-15: 1 mg via INTRAVENOUS
  Filled 2012-07-15: qty 1

## 2012-07-15 MED ORDER — ONDANSETRON HCL 4 MG/2ML IJ SOLN
4.0000 mg | Freq: Three times a day (TID) | INTRAMUSCULAR | Status: AC | PRN
  Administered 2012-07-15: 4 mg via INTRAVENOUS
  Filled 2012-07-15: qty 2

## 2012-07-15 MED ORDER — VITAMIN B-1 100 MG PO TABS
100.0000 mg | ORAL_TABLET | Freq: Every day | ORAL | Status: DC
  Administered 2012-07-16 – 2012-07-18 (×3): 100 mg via ORAL
  Filled 2012-07-15 (×3): qty 1

## 2012-07-15 MED ORDER — THIAMINE HCL 100 MG/ML IJ SOLN
100.0000 mg | Freq: Every day | INTRAMUSCULAR | Status: DC
  Administered 2012-07-15: 100 mg via INTRAVENOUS
  Filled 2012-07-15: qty 2

## 2012-07-15 MED ORDER — SODIUM CHLORIDE 0.9 % IV SOLN
INTRAVENOUS | Status: AC
  Administered 2012-07-15: 21:00:00 via INTRAVENOUS

## 2012-07-15 MED ORDER — ENOXAPARIN SODIUM 40 MG/0.4ML ~~LOC~~ SOLN
40.0000 mg | SUBCUTANEOUS | Status: DC
  Administered 2012-07-15 – 2012-07-17 (×3): 40 mg via SUBCUTANEOUS
  Filled 2012-07-15 (×3): qty 0.4

## 2012-07-15 MED ORDER — SODIUM CHLORIDE 0.9 % IV SOLN
Freq: Once | INTRAVENOUS | Status: AC
  Administered 2012-07-15: 12:00:00 via INTRAVENOUS

## 2012-07-15 MED ORDER — IOHEXOL 300 MG/ML  SOLN
100.0000 mL | Freq: Once | INTRAMUSCULAR | Status: AC | PRN
  Administered 2012-07-15: 100 mL via INTRAVENOUS

## 2012-07-15 MED ORDER — DEXTROSE-NACL 5-0.9 % IV SOLN
INTRAVENOUS | Status: DC
  Administered 2012-07-15: 23:00:00 via INTRAVENOUS

## 2012-07-15 MED ORDER — SODIUM CHLORIDE 0.9 % IV BOLUS (SEPSIS)
1000.0000 mL | Freq: Once | INTRAVENOUS | Status: AC
  Administered 2012-07-15: 1000 mL via INTRAVENOUS

## 2012-07-15 MED ORDER — ONDANSETRON HCL 4 MG/2ML IJ SOLN
4.0000 mg | Freq: Once | INTRAMUSCULAR | Status: AC
  Administered 2012-07-15: 4 mg via INTRAVENOUS
  Filled 2012-07-15: qty 2

## 2012-07-15 MED ORDER — FOLIC ACID 1 MG PO TABS
1.0000 mg | ORAL_TABLET | Freq: Every day | ORAL | Status: DC
  Administered 2012-07-15 – 2012-07-18 (×4): 1 mg via ORAL
  Filled 2012-07-15 (×4): qty 1

## 2012-07-15 MED ORDER — ADULT MULTIVITAMIN W/MINERALS CH
1.0000 | ORAL_TABLET | Freq: Every day | ORAL | Status: DC
  Administered 2012-07-15 – 2012-07-18 (×4): 1 via ORAL
  Filled 2012-07-15 (×4): qty 1

## 2012-07-15 MED ORDER — INFLUENZA VIRUS VACC SPLIT PF IM SUSP
0.5000 mL | INTRAMUSCULAR | Status: AC
  Administered 2012-07-16: 0.5 mL via INTRAMUSCULAR
  Filled 2012-07-15: qty 0.5

## 2012-07-15 MED ORDER — ONDANSETRON HCL 4 MG/2ML IJ SOLN: 4.0000 mg | Freq: Four times a day (QID) | INTRAMUSCULAR | Status: DC | PRN

## 2012-07-15 NOTE — ED Notes (Signed)
Pt arrived by ems from home, stated she quit drinking alcohol last Wednesday has been drinking everyday since last December, began vomiting this am, noted streaks of blood, after coughing. Denies any pain.  Has been having tremors for several days. Has taken no meds for tremors. Has not been to her pmd for any check-ups. Denies any fever w/ cough

## 2012-07-15 NOTE — ED Notes (Signed)
Pt returned from ct scan, sats 88-89% on room air, 02 applied at 2 l by nasal cannula, denies pain at this time.

## 2012-07-15 NOTE — ED Notes (Signed)
Pt resting in bed, normal rise and fall of chest noted. Pt voices no complaints.

## 2012-07-15 NOTE — ED Notes (Signed)
Dr.zackowski notified of pt's potassium level.  Nodded that he was aware.

## 2012-07-15 NOTE — ED Notes (Signed)
Pt arrived by ems from home, called out for vomiting blood, streaks noted in bag, pt has cough and vomits after coughing.  Also stopped drinking etoh Wednesday of last week.  Has been drinking daily since Christmas.

## 2012-07-15 NOTE — ED Notes (Signed)
Pt to ct scan.

## 2012-07-15 NOTE — ED Provider Notes (Signed)
History   This chart was scribed for Mervin Kung, MD by Hampton Abbot. This patient was seen in room APA01/APA01 and the patient's care was started at 12:33PM.   CSN: [id number]  Arrival date & time 07/15/12  1122   First MD Initiated Contact with Patient 07/15/12 1212      Chief Complaint  Patient presents with  . Emesis    (Consider location/radiation/quality/duration/timing/severity/associated sxs/prior treatment) The history is provided by the patient. No language interpreter was used.   Savannah Jackson is a 56 y.o. female with a recent h/o alcoholism who stopped drinking 2 days ago brought in by ambulance, who presents to the Emergency Department complaining of coughs causeing bloody emesis beginning today.  Pt has been drinking heavily for past 9 months.  Pt has h/o HTN, cancer, and chronic back pain.  Pt has no known allergies.  Pt denies tobacco use. Past Medical History  Diagnosis Date  . Hypertension   . Brain aneurysm   . Cancer   . Rotator cuff syndrome of left shoulder   . DDD (degenerative disc disease), lumbar   . Chronic back pain     Past Surgical History  Procedure Date  . Cholecystectomy   . Brain surgery   . Breast surgery   . Total knee arthroplasty     right    No family history on file.  History  Substance Use Topics  . Smoking status: Never Smoker   . Smokeless tobacco: Not on file  . Alcohol Use: No     stopped 07/10/12, was drinking daily     No OB history.   Review of Systems  Respiratory: Positive for chest tightness.   Cardiovascular: Negative for chest pain.  Gastrointestinal: Positive for nausea, vomiting, abdominal pain (Peri-umbilical) and diarrhea.  Musculoskeletal: Positive for myalgias.  All other systems reviewed and are negative.    Allergies  Latex  Home Medications   Current Outpatient Rx  Name Route Sig Dispense Refill  . ASPIRIN-CAFFEINE 845-65 MG PO PACK Oral Take 1 packet by mouth every 6 (six) hours as  needed. Pain    . METHOCARBAMOL 500 MG PO TABS Oral Take 500 mg by mouth daily as needed. Muscle Spasms    . METOPROLOL TARTRATE 50 MG PO TABS Oral Take 50 mg by mouth 2 (two) times daily.    Bethann Humble SULFATE 0.05-0.25 % OP SOLN Both Eyes Place 2 drops into both eyes 3 (three) times daily as needed. Dry Eyes      BP 160/92  Pulse 86  Temp 97.9 F (36.6 C) (Oral)  Resp 22  Ht 5\' 3"  (1.6 m)  Wt 170 lb (77.111 kg)  BMI 30.11 kg/m2  SpO2 98%  Physical Exam  Nursing note and vitals reviewed. Constitutional: She is oriented to person, place, and time. She appears well-developed and well-nourished.  HENT:  Head: Normocephalic and atraumatic.  Mouth/Throat: Oropharynx is clear and moist.  Eyes: Conjunctivae normal and EOM are normal.  Neck: Normal range of motion. Neck supple. No tracheal deviation present.  Cardiovascular: Normal rate, regular rhythm and normal heart sounds.   No murmur heard. Pulmonary/Chest: Effort normal and breath sounds normal. She has no wheezes.  Abdominal: Soft. There is tenderness (epigastric).       Slightly decreased bowel sounds  Musculoskeletal: Normal range of motion. She exhibits no edema.  Neurological: She is alert and oriented to person, place, and time. Coordination normal.  Skin: Skin is warm and dry.  Psychiatric:  She has a normal mood and affect.    ED Course  Procedures (including critical care time) DIAGNOSTIC STUDIES: Oxygen Saturation is 98% on room air, normal by my interpretation.    COORDINATION OF CARE: 12:42PM- Ordered IV fluids.   Labs Reviewed  CBC WITH DIFFERENTIAL - Abnormal; Notable for the following:    Hemoglobin 15.6 (*)     HCT 46.8 (*)     Platelets 106 (*)     Neutrophils Relative 82 (*)     Lymphocytes Relative 10 (*)     All other components within normal limits  BASIC METABOLIC PANEL - Abnormal; Notable for the following:    Potassium 2.8 (*)     Glucose, Bld 168 (*)     Creatinine, Ser 0.49 (*)      All other components within normal limits  ETHANOL   Dg Chest 2 View  07/15/2012  *RADIOLOGY REPORT*  Clinical Data: Vomiting, past history breast cancer  CHEST - 2 VIEW  Comparison: 11/30/2008  Findings: Upper-normal size of cardiac silhouette. Tortuous aorta. Pulmonary vascularity normal. Minimal right base atelectasis. Lungs otherwise clear. No pleural effusion or pneumothorax. Surgical clips right axilla.  IMPRESSION: Minimal right basilar atelectasis.   Original Report Authenticated By: Burnetta Sabin, M.D.    Results for orders placed during the hospital encounter of 07/15/12  CBC WITH DIFFERENTIAL      Component Value Range   WBC 7.0  4.0 - 10.5 K/uL   RBC 5.01  3.87 - 5.11 MIL/uL   Hemoglobin 15.6 (*) 12.0 - 15.0 g/dL   HCT 46.8 (*) 36.0 - 46.0 %   MCV 93.4  78.0 - 100.0 fL   MCH 31.1  26.0 - 34.0 pg   MCHC 33.3  30.0 - 36.0 g/dL   RDW 14.7  11.5 - 15.5 %   Platelets 106 (*) 150 - 400 K/uL   Neutrophils Relative 82 (*) 43 - 77 %   Neutro Abs 5.7  1.7 - 7.7 K/uL   Lymphocytes Relative 10 (*) 12 - 46 %   Lymphs Abs 0.7  0.7 - 4.0 K/uL   Monocytes Relative 7  3 - 12 %   Monocytes Absolute 0.5  0.1 - 1.0 K/uL   Eosinophils Relative 0  0 - 5 %   Eosinophils Absolute 0.0  0.0 - 0.7 K/uL   Basophils Relative 1  0 - 1 %   Basophils Absolute 0.1  0.0 - 0.1 K/uL  BASIC METABOLIC PANEL      Component Value Range   Sodium 140  135 - 145 mEq/L   Potassium 2.8 (*) 3.5 - 5.1 mEq/L   Chloride 99  96 - 112 mEq/L   CO2 24  19 - 32 mEq/L   Glucose, Bld 168 (*) 70 - 99 mg/dL   BUN 7  6 - 23 mg/dL   Creatinine, Ser 0.49 (*) 0.50 - 1.10 mg/dL   Calcium 9.0  8.4 - 10.5 mg/dL   GFR calc non Af Amer >90  >90 mL/min   GFR calc Af Amer >90  >90 mL/min  ETHANOL      Component Value Range   Alcohol, Ethyl (B) <11  0 - 11 mg/dL  COMPREHENSIVE METABOLIC PANEL      Component Value Range   Sodium 138  135 - 145 mEq/L   Potassium 3.0 (*) 3.5 - 5.1 mEq/L   Chloride 100  96 - 112 mEq/L   CO2  24  19 - 32 mEq/L  Glucose, Bld 123 (*) 70 - 99 mg/dL   BUN 7  6 - 23 mg/dL   Creatinine, Ser 0.49 (*) 0.50 - 1.10 mg/dL   Calcium 8.9  8.4 - 10.5 mg/dL   Total Protein 7.5  6.0 - 8.3 g/dL   Albumin 3.1 (*) 3.5 - 5.2 g/dL   AST 118 (*) 0 - 37 U/L   ALT 29  0 - 35 U/L   Alkaline Phosphatase 240 (*) 39 - 117 U/L   Total Bilirubin 3.3 (*) 0.3 - 1.2 mg/dL   GFR calc non Af Amer >90  >90 mL/min   GFR calc Af Amer >90  >90 mL/min  LIPASE, BLOOD      Component Value Range   Lipase 30  11 - 59 U/L  CBC WITH DIFFERENTIAL      Component Value Range   WBC 6.6  4.0 - 10.5 K/uL   RBC 4.82  3.87 - 5.11 MIL/uL   Hemoglobin 14.9  12.0 - 15.0 g/dL   HCT 42.9  36.0 - 46.0 %   MCV 89.0  78.0 - 100.0 fL   MCH 30.9  26.0 - 34.0 pg   MCHC 34.7  30.0 - 36.0 g/dL   RDW 14.4  11.5 - 15.5 %   Platelets 102 (*) 150 - 400 K/uL   Neutrophils Relative 80 (*) 43 - 77 %   Neutro Abs 5.3  1.7 - 7.7 K/uL   Lymphocytes Relative 9 (*) 12 - 46 %   Lymphs Abs 0.6 (*) 0.7 - 4.0 K/uL   Monocytes Relative 10  3 - 12 %   Monocytes Absolute 0.7  0.1 - 1.0 K/uL   Eosinophils Relative 0  0 - 5 %   Eosinophils Absolute 0.0  0.0 - 0.7 K/uL   Basophils Relative 1  0 - 1 %   Basophils Absolute 0.1  0.0 - 0.1 K/uL     Date: 07/15/2012  Rate: 81  Rhythm: normal sinus rhythm and premature ventricular contractions (PVC)  QRS Axis: normal  Intervals: QT prolonged  ST/T Wave abnormalities: nonspecific ST/T changes  Conduction Disutrbances:none  Narrative Interpretation:   Old EKG Reviewed: unchanged No significant change in EKG compared to the reversed 2010.   1. Nausea vomiting and diarrhea   2. Alcohol withdrawal       MDM  Patient here for a combined problem today part of his nausea vomiting and diarrhea and some gastritis patient had a heavy history of drinking alcohol stopped on Saturday and is having some mild withdrawal symptoms with tremors no tachycardia no hypertension no hallucinations. 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 Patient still with some vomiting and diarrhea here in the emergency part will require admission. Improve significantly with Ativan for alcohol withdrawal standpoint. Patient would benefit from continued IV fluid hydration also potassium was low at 3.2 runs of 10 mEq of potassium IV piggyback. That'll need to be followed followed up as an inpatient. Discussed with Dr. Legrand Rams he will admit temporary middle orders done.    I personally performed the services described in this documentation, which was scribed in my presence. The recorded information has been reviewed and considered.          Mervin Kung, MD 07/15/12 [phone]

## 2012-07-15 NOTE — ED Notes (Signed)
Pt reports doesn't think can tolerated both bottles of contrast.  Notified CT that pt drank 1 1/2 bottles and they said they could go ahead and scan her.

## 2012-07-15 NOTE — ED Notes (Signed)
Pt resting in bed, normal rise and fall of chest, stated meds have eased her pain, multiple family members at bedside.

## 2012-07-16 LAB — CBC
Hemoglobin: 13.9 g/dL (ref 12.0–15.0)
MCH: 30.9 pg (ref 26.0–34.0)
MCHC: 32.5 g/dL (ref 30.0–36.0)
RDW: 14.8 % (ref 11.5–15.5)

## 2012-07-16 LAB — HEPATIC FUNCTION PANEL
ALT: 25 U/L (ref 0–35)
Albumin: 2.8 g/dL — ABNORMAL LOW (ref 3.5–5.2)
Alkaline Phosphatase: 204 U/L — ABNORMAL HIGH (ref 39–117)
Indirect Bilirubin: 1.5 mg/dL — ABNORMAL HIGH (ref 0.3–0.9)
Total Protein: 6.8 g/dL (ref 6.0–8.3)

## 2012-07-16 LAB — BASIC METABOLIC PANEL
Calcium: 7.9 mg/dL — ABNORMAL LOW (ref 8.4–10.5)
GFR calc Af Amer: >90 mL/min (ref >90)
GFR calc non Af Amer: >90 mL/min (ref >90)
Glucose, Bld: 118 mg/dL — ABNORMAL HIGH (ref 70–99)
Sodium: 141 mEq/L (ref 135–145)

## 2012-07-16 MED ORDER — POTASSIUM CHLORIDE 10 MEQ/100ML IV SOLN
10.0000 meq | INTRAVENOUS | Status: AC
  Administered 2012-07-16 (×3): 10 meq via INTRAVENOUS
  Filled 2012-07-16: qty 100

## 2012-07-16 MED ORDER — POTASSIUM CHLORIDE 10 MEQ/100ML IV SOLN
INTRAVENOUS | Status: AC
  Administered 2012-07-16: 10 meq
  Filled 2012-07-16: qty 100

## 2012-07-16 MED ORDER — FUROSEMIDE 10 MG/ML IJ SOLN
20.0000 mg | Freq: Once | INTRAMUSCULAR | Status: AC
  Administered 2012-07-16: 20 mg via INTRAVENOUS
  Filled 2012-07-16: qty 2

## 2012-07-16 MED ORDER — BOOST / RESOURCE BREEZE PO LIQD
1.0000 | Freq: Two times a day (BID) | ORAL | Status: DC
  Administered 2012-07-16 – 2012-07-18 (×3): 1 via ORAL
  Filled 2012-07-16 (×6): qty 1

## 2012-07-16 MED ORDER — POTASSIUM CHLORIDE IN NACL 40-0.9 MEQ/L-% IV SOLN
INTRAVENOUS | Status: DC
  Administered 2012-07-16 – 2012-07-17 (×3): via INTRAVENOUS
  Filled 2012-07-16 (×5): qty 1000

## 2012-07-16 NOTE — Progress Notes (Signed)
UR Chart Review Completed  
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
## 2012-07-16 NOTE — Care Management Note (Signed)
    Page 1 of 1   07/18/2012     11:47:13 AM   CARE MANAGEMENT NOTE 07/18/2012  Patient:  SHARAE, Savannah Jackson   Account Number:  [id number]  Date Initiated:  07/16/2012  Documentation initiated by:  Theophilus Kinds  Subjective/Objective Assessment:   Pt admitted from home with etoh withdrawal. Pt lives with her husband who she has to take care of. Pt is independent with ADL's and will return home at discharge.     Action/Plan:   CSW spoke with pt regarding etoh use. Pt has no HH or CM needs.   Anticipated DC Date:  07/18/2012   Anticipated DC Plan:  HOME/SELF CARE  In-house referral  Clinical Social Worker      DC Planning Services  CM consult      Choice offered to / List presented to:             Status of service:  Completed, signed off Medicare Important Message given?  YES (If response is "NO", the following Medicare IM given date fields will be blank) Date Medicare IM given:  07/18/2012 Date Additional Medicare IM given:    Discharge Disposition:  HOME/SELF CARE  Per UR Regulation:    If discussed at Long Length of Stay Meetings, dates discussed:    Comments:  07/18/12 [place], RN BSN CM Pt discharged home today. No CM/HH needs noted.  07/16/12 [place], RN BSN CM

## 2012-07-16 NOTE — H&P (Signed)
Savannah Jackson MRN: [id number] DOB/AGE: [birth year] 56 y.o. Primary Care Physician:Brunetta Newingham, MD Admit date: 07/15/2012 Chief Complaint: Nausea and vomiting HPI:  This a 56 years old female patient came to ER with the above complaint. She admits that she has been drinking alcohol until last Saturday. She was stressed due to the health of her husband. Recently he was ambulated his leg. She was trying to take care of him. She started feeling abdominal pain, nausea and vomiting in the last 2-3 days. She has also back pain. No fever or chills. She feels anxious, nervous and shaky. No cough, chest pain, shortness of breath,  Dysuria, urgency or frequency of urination.  Past Medical History  Diagnosis Date  . Hypertension   . Brain aneurysm   . Cancer   . Rotator cuff syndrome of left shoulder   . DDD (degenerative disc disease), lumbar   . Chronic back pain    Past Surgical History  Procedure Date  . Cholecystectomy   . Brain surgery   . Breast surgery   . Total knee arthroplasty     right        History reviewed. No pertinent family history.  Social History:  reports that she has never smoked. She does not have any smokeless tobacco history on file. She reports that she does not drink alcohol or use illicit drugs.   Allergies:  Allergies  Allergen Reactions  . Latex Swelling    Discoloration of skin.     Medications Prior to Admission  Medication Sig Dispense Refill  . Aspirin-Caffeine (BC FAST PAIN RELIEF) 845-65 MG PACK Take 1 packet by mouth every 6 (six) hours as needed. Pain      . methocarbamol (ROBAXIN) 500 MG tablet Take 500 mg by mouth daily as needed. Muscle Spasms      . metoprolol (LOPRESSOR) 50 MG tablet Take 50 mg by mouth 2 (two) times daily.      Marland Kitchen tetrahydrozoline-zinc (VISINE-AC) 0.05-0.25 % ophthalmic solution Place 2 drops into both eyes 3 (three) times daily as needed. Dry Eyes           [id number] from the symptoms mentioned above,there are no  other symptoms referable to all systems reviewed.  Physical Exam: Blood pressure 180/94, pulse 75, temperature 98.5 F (36.9 C), temperature source Oral, resp. rate 18, height 5\' 3"  (1.6 m), weight 71 kg (156 lb 8.4 oz), SpO2 94.00%. General Condition- alert and awake, sick looking and anxious Respiratory- decreased air entry, rhonchi bilaterally CVS- S1 & S2 heard, regular Abdomen- soft and lax, RUQ tenderness, ?? Hepatomegaly EXT- no leg edema      Basename 07/16/12 0457 07/15/12 1251 07/15/12 1134  WBC 7.3 6.6 --  NEUTROABS -- 5.3 5.7  HGB 13.9 14.9 --  HCT 42.8 42.9 --  MCV 95.1 89.0 --  PLT 85* 102* --    Basename 07/16/12 0457 07/15/12 1251  NA 141 138  K 2.8* 3.0*  CL 103 100  CO2 30 24  GLUCOSE 118* 123*  BUN 5* 7  CREATININE 0.59 0.49*  CALCIUM 7.9* 8.9  MG -- --  lablast2(ast:2,ALT:2,alkphos:2,bilitot:2,prot:2,albumin:2)@    No results found for this or any previous visit (from the past 240 hour(s)).   Dg Chest 2 View  07/15/2012  *RADIOLOGY REPORT*  Clinical Data: Vomiting, past history breast cancer  CHEST - 2 VIEW  Comparison: 11/30/2008  Findings: Upper-normal size of cardiac silhouette. Tortuous aorta. Pulmonary vascularity normal. Minimal right base atelectasis. Lungs otherwise clear. No pleural effusion  or pneumothorax. Surgical clips right axilla.  IMPRESSION: Minimal right basilar atelectasis.   Original Report Authenticated By: Burnetta Sabin, M.D.    Ct Abdomen Pelvis W Contrast  07/15/2012  *RADIOLOGY REPORT*  Clinical Data: Abdominal pain.  Hematemesis.  Out colon views. Breast cancer.  CT ABDOMEN AND PELVIS WITH CONTRAST  Technique:  Multidetector CT imaging of the abdomen and pelvis was performed following the standard protocol during bolus administration of intravenous contrast.  Contrast: 125mL OMNIPAQUE IOHEXOL 300 MG/ML  SOLN  Comparison: None.  Findings: Hepatic cirrhosis is demonstrated with numerous diffuse less than 1 cm low attenuation  nodules throughout the hepatic parenchyma.  No dominant or hypovascular liver masses are identified.  No evidence of splenomegaly or ascites.  No evidence of portal vein thrombosis.  Prior cholecystectomy noted.  The pancreas, adrenal glands, and kidneys are normal in appearance. No evidence of hydronephrosis.  No abdominal lymphadenopathy identified.  Several small uterine fibroids are seen, largest measuring approximately 0.5 cm.  Adnexa are unremarkable.  No other soft tissue masses or lymphadenopathy identified.  Diverticulosis is again seen involving the colon diffusely, however there is no evidence of diverticulitis.  No other inflammatory process or abnormal fluid collections identified.  IMPRESSION:  1.  No acute findings. 2.  Diverticulosis.  No radiographic evidence of diverticulitis. 3.  Small uterine fibroids, largest measuring approximately 3.5 cm. 4.  Micronodular hepatic cirrhosis.  No definite liver mass identified.  No evidence of splenomegaly or ascites.   Original Report Authenticated By: Marlaine Hind, M.D.    Impression: 1. Abnormal LFT probably secondary to alcohol Cirrhosis 2. Hypokalemia- we will supplement K+ 3. Alcohol withdrawal syndrome 4. Anxiety disorder 5. H/O hypertension Active Problems:  * No active hospital problems. *      Plan: GI consult We will supplement KCL DT proro tocol We monitor BMP and LFT       Cezar Misiaszek Pager [phone]  07/16/2012, 7:44 AM

## 2012-07-16 NOTE — Progress Notes (Signed)
ANTICOAGULATION CONSULT NOTE - Initial Consult  Pharmacy Consult for Lovenox Indication: VTE prophylaxis  Allergies  Allergen Reactions  . Latex Swelling    Discoloration of skin.     Patient Measurements: Height: 5\' 3"  (160 cm) Weight: 156 lb 8.4 oz (71 kg) IBW/kg (Calculated) : 52.4   Vital Signs: Temp: 98.5 F (36.9 C) (09/17 0349) Temp src: Oral (09/17 0349) BP: 180/94 mmHg (09/17 0349) Pulse Rate: 75  (09/17 0349)  Labs:  Basename 07/16/12 0457 07/15/12 1251 07/15/12 1134  HGB 13.9 14.9 --  HCT 42.8 42.9 46.8*  PLT 85* 102* 106*  APTT -- -- --  LABPROT -- -- --  INR -- -- --  HEPARINUNFRC -- -- --  CREATININE 0.59 0.49* 0.49*  CKTOTAL -- -- --  CKMB -- -- --  TROPONINI -- -- --    Estimated Creatinine Clearance: 74.1 ml/min (by C-G formula based on Cr of 0.59).   Medical History: Past Medical History  Diagnosis Date  . Hypertension   . Brain aneurysm   . Cancer   . Rotator cuff syndrome of left shoulder   . DDD (degenerative disc disease), lumbar   . Chronic back pain     Medications:  Scheduled:    . sodium chloride   Intravenous Once  . sodium chloride   Intravenous STAT  . enoxaparin (LOVENOX) injection  40 mg Subcutaneous Q24H  . folic acid  1 mg Oral Daily  . HYDROmorphone  1 mg Intravenous Once  . influenza  inactive virus vaccine  0.5 mL Intramuscular Tomorrow-1000  . LORazepam  1 mg Intravenous Once  . LORazepam  0-4 mg Oral Q6H   Followed by  . LORazepam  0-4 mg Oral Q12H  . metoprolol  50 mg Oral BID  . multivitamin with minerals  1 tablet Oral Daily  . ondansetron  4 mg Intravenous Once  . potassium chloride  10 mEq Intravenous Once  . potassium chloride  10 mEq Intravenous Once  . sodium chloride  1,000 mL Intravenous Once  . thiamine  100 mg Oral Daily   Or  . thiamine  100 mg Intravenous Daily    Assessment: 57 yo F admitted with nausea, vomiting and alcochol withdrawal started on Lovenox for VTE px while hospitalized.    Renal function is normal.  Low baseline platelet count noted.   Goal of Therapy:  Monitor platelets by anticoagulation protocol: Yes   Plan:  Lovenox 40 mg sq daily CBC q 72hrs If Platelet count continues to drop may consider mechanical VTE px only.   Biagio Borg 07/16/2012,7:39 AM

## 2012-07-16 NOTE — Progress Notes (Signed)
INITIAL ADULT NUTRITION ASSESSMENT Date: 07/16/2012   Time: 1:33 PM Reason for Assessment: nutrition risk   ASSESSMENT: Female 56 y.o.  Dx: Nausea and vomiting  INTERVENTION: 1. Will order patient Resource Breeze BID, provides 500 kcal and 18 grams of protein.  2. RD to follow for nutrition plan of care.   Hx:  Past Medical History  Diagnosis Date  . Hypertension   . Brain aneurysm   . Cancer   . Rotator cuff syndrome of left shoulder   . DDD (degenerative disc disease), lumbar   . Chronic back pain     Related Meds:  Scheduled Meds:   . sodium chloride   Intravenous STAT  . enoxaparin (LOVENOX) injection  40 mg Subcutaneous Q24H  . folic acid  1 mg Oral Daily  . influenza  inactive virus vaccine  0.5 mL Intramuscular Tomorrow-1000  . LORazepam  0-4 mg Oral Q6H   Followed by  . LORazepam  0-4 mg Oral Q12H  . metoprolol  50 mg Oral BID  . multivitamin with minerals  1 tablet Oral Daily  . potassium chloride  10 mEq Intravenous Once  . potassium chloride  10 mEq Intravenous Once  . potassium chloride  10 mEq Intravenous Q1 Hr x 3  . potassium chloride      . thiamine  100 mg Oral Daily   Or  . thiamine  100 mg Intravenous Daily   Continuous Infusions:   . 0.9 % NaCl with KCl 40 mEq / L 75 mL/hr at 07/16/12 0900  . DISCONTD: dextrose 5 % and 0.9% NaCl 75 mL/hr at 07/15/12 2301   PRN Meds:.HYDROmorphone (DILAUDID) injection, iohexol, ondansetron (ZOFRAN) IV, DISCONTD: ondansetron (ZOFRAN) IV   Ht: 5\' 3"  (160 cm)  Wt: 156 lb 8.4 oz (71 kg)  Ideal Wt: 52.7 kg % Ideal Wt: 136% Wt Readings from Last 10 Encounters:  07/15/12 156 lb 8.4 oz (71 kg)  04/02/12 157 lb (71.215 kg)    Usual Wt: 170 lb per patient 9 months ago % Usual Wt: 91.7%  Body mass index is 27.73 kg/(m^2). (Overweight)  Food/Nutrition Related Hx: Patient reported her appetite has not been good. She is eating about 50% of her meals on clear liquid diet. She reported her appetite and intake  were poor PTA. Noted patient with abdominal pain and nausea. Patient agreed to try peach resource breeze nutrition supplement.   Labs:  CMP     Component Value Date/Time   NA 141 07/16/2012 0457   K 2.8* 07/16/2012 0457   CL 103 07/16/2012 0457   CO2 30 07/16/2012 0457   GLUCOSE 118* 07/16/2012 0457   BUN 5* 07/16/2012 0457   CREATININE 0.59 07/16/2012 0457   CALCIUM 7.9* 07/16/2012 0457   PROT 6.8 07/16/2012 0530   ALBUMIN 2.8* 07/16/2012 0530   AST 99* 07/16/2012 0530   ALT 25 07/16/2012 0530   ALKPHOS 204* 07/16/2012 0530   BILITOT 2.6* 07/16/2012 0530   GFRNONAA >90 07/16/2012 0457   GFRAA >90 07/16/2012 0457     Intake/Output Summary (Last 24 hours) at 07/16/12 1338 Last data filed at 07/16/12 0615  Gross per 24 hour  Intake 1109.28 ml  Output    150 ml  Net 959.28 ml   *Fluid retention noted  Diet Order: Clear Liquid  Supplements/Tube Feeding: none at this time   IVF:    0.9 % NaCl with KCl 40 mEq / L Last Rate: 75 mL/hr at 07/16/12 0900  DISCONTD: dextrose 5 %  and 0.9% NaCl Last Rate: 75 mL/hr at 07/15/12 2301    Estimated Nutritional Needs:   Kcal: [id number] Protein: 92-106 grams Fluid: fluid per MD request  NUTRITION DIAGNOSIS: -Inadequate oral intake (NI-2.1).  Status: Ongoing  RELATED TO: limited ability to eat and poor appetite  AS EVIDENCE BY: pt on clear liquid diet restriction with PO intake 50% at meals  MONITORING/EVALUATION(Goals): Diet advancements/ tolerance, weights, labs, PO intake 1. Diet advancements as medically able.  2. Positive tolerance of diet advancements. 3.PO intake > 50% at meals.   EDUCATION NEEDS: -No education needs identified at this time  INTERVENTION: 1. Will order patient Resource Breeze BID, provides 500 kcal and 18 grams of protein.  2. RD to follow for nutrition plan of care.   Dietitian [phone]  DOCUMENTATION CODES Per approved criteria  -Not Applicable    Loyce Dys [facility] 07/16/2012, 1:33 PM

## 2012-07-16 NOTE — Progress Notes (Signed)
Subjective: Complains of abdominal  And back pain. She is still nauseated.  Objective: Vital signs in last 24 hours: Temp:  [97.2 F (36.2 C)-98.5 F (36.9 C)] 98.5 F (36.9 C) (09/17 0349) Pulse Rate:  [75-101] 75  (09/17 0349) Resp:  [16-22] 18  (09/17 0349) BP: (149-185)/(83-96) 180/94 mmHg (09/17 0349) SpO2:  [94 %-99 %] 94 % (09/17 0349) Weight:  [71 kg (156 lb 8.4 oz)-77.111 kg (170 lb)] 71 kg (156 lb 8.4 oz) (09/16 1639) Weight change:  Last BM Date: 07/15/12  Intake/Output from previous day: 09/16 0701 - 09/17 0700 In: 1109.3 [I.V.:1109.3] Out: 150 [Urine:150]  PHYSICAL EXAM General appearance: alert and no distress Resp: rhonchi bibasilar Cardio: S1, S2 normal GI: abnormal findings:  hepatomegaly, liver edge palpable 4 cm below costal margin Extremities: extremities normal, atraumatic, no cyanosis or edema  Lab Results:    @labtest @ ABGS No results found for this basename: PHART,PCO2,PO2ART,TCO2,HCO3 in the last 72 hours CULTURES No results found for this or any previous visit (from the past 240 hour(s)). Studies/Results: Dg Chest 2 View  07/15/2012  *RADIOLOGY REPORT*  Clinical Data: Vomiting, past history breast cancer  CHEST - 2 VIEW  Comparison: 11/30/2008  Findings: Upper-normal size of cardiac silhouette. Tortuous aorta. Pulmonary vascularity normal. Minimal right base atelectasis. Lungs otherwise clear. No pleural effusion or pneumothorax. Surgical clips right axilla.  IMPRESSION: Minimal right basilar atelectasis.   Original Report Authenticated By: Burnetta Sabin, M.D.    Ct Abdomen Pelvis W Contrast  07/15/2012  *RADIOLOGY REPORT*  Clinical Data: Abdominal pain.  Hematemesis.  Out colon views. Breast cancer.  CT ABDOMEN AND PELVIS WITH CONTRAST  Technique:  Multidetector CT imaging of the abdomen and pelvis was performed following the standard protocol during bolus administration of intravenous contrast.  Contrast: 167mL OMNIPAQUE IOHEXOL 300 MG/ML  SOLN   Comparison: None.  Findings: Hepatic cirrhosis is demonstrated with numerous diffuse less than 1 cm low attenuation nodules throughout the hepatic parenchyma.  No dominant or hypovascular liver masses are identified.  No evidence of splenomegaly or ascites.  No evidence of portal vein thrombosis.  Prior cholecystectomy noted.  The pancreas, adrenal glands, and kidneys are normal in appearance. No evidence of hydronephrosis.  No abdominal lymphadenopathy identified.  Several small uterine fibroids are seen, largest measuring approximately 0.5 cm.  Adnexa are unremarkable.  No other soft tissue masses or lymphadenopathy identified.  Diverticulosis is again seen involving the colon diffusely, however there is no evidence of diverticulitis.  No other inflammatory process or abnormal fluid collections identified.  IMPRESSION:  1.  No acute findings. 2.  Diverticulosis.  No radiographic evidence of diverticulitis. 3.  Small uterine fibroids, largest measuring approximately 3.5 cm. 4.  Micronodular hepatic cirrhosis.  No definite liver mass identified.  No evidence of splenomegaly or ascites.   Original Report Authenticated By: Marlaine Hind, M.D.     Medications: I have reviewed the patient's current medications.  Assesment: 1.Abnormal LFT 2 . Liver cirrhosis  3. Hypokalemia- 4. Alcohol withdrawal syndrome  5.Anxiety disorder  6. H/O hypertension  Active Problems:  * No active hospital problems. *     Plan: Continue iv fluid Continue ativan according protocol Continue to replace K+ GI consult    LOS: 1 day   Finnlee Guarnieri 07/16/2012, 8:00 AM

## 2012-07-17 DIAGNOSIS — R740 Nonspecific elevation of levels of transaminase and lactic acid dehydrogenase [LDH]: Secondary | ICD-10-CM

## 2012-07-17 DIAGNOSIS — R7401 Elevation of levels of liver transaminase levels: Secondary | ICD-10-CM

## 2012-07-17 DIAGNOSIS — F1020 Alcohol dependence, uncomplicated: Secondary | ICD-10-CM

## 2012-07-17 LAB — HEPATIC FUNCTION PANEL
Alkaline Phosphatase: 198 U/L — ABNORMAL HIGH (ref 39–117)
Indirect Bilirubin: 1.6 mg/dL — ABNORMAL HIGH (ref 0.3–0.9)
Total Bilirubin: 3.1 mg/dL — ABNORMAL HIGH (ref 0.3–1.2)

## 2012-07-17 LAB — BASIC METABOLIC PANEL
CO2: 26 mEq/L (ref 19–32)
Calcium: 7.7 mg/dL — ABNORMAL LOW (ref 8.4–10.5)
Creatinine, Ser: 0.61 mg/dL (ref 0.50–1.10)
Glucose, Bld: 84 mg/dL (ref 70–99)

## 2012-07-17 LAB — PROTIME-INR: Prothrombin Time: 16.8 seconds — ABNORMAL HIGH (ref 11.6–15.2)

## 2012-07-17 MED ORDER — LISINOPRIL 10 MG PO TABS
20.0000 mg | ORAL_TABLET | Freq: Every day | ORAL | Status: DC
  Administered 2012-07-17 – 2012-07-18 (×2): 20 mg via ORAL
  Filled 2012-07-17 (×2): qty 2

## 2012-07-17 NOTE — Consult Note (Signed)
Reason for Consult:Elevated LFTs Referring Physician: [name] is an 56 y.o. female.  HPI: Admitted Monday thru the ED with c/o that she quit drinking ETOH Saturday. She tells me she had shakes and sweats.  She thought she was going into DTs.  She was having nausea and vomiting.  She also had lower abdominal pain. She also tells me she had diarrhea. She had three loose stools. She usually drinks about 2 large drinks of day of whiskey. She started drinking about 9 months ago after her husband became sick and his leg was amputated as a result of uncontrolled diabetes. She had been depressed. She had to do all ADLs for him.  Today she tells me she feels at ease knowing she made the first step to give up etoh.  She did drink socially with her girlfriends every other weekend. She would have one cocktail. Her appetite is better. There is no nausea or vomiting now. No diarrhea. She does not have any tattoos. No IV drug use. Husband did not do IV drugs or tattoos. No bright red rectal bleeding or melena.  Past Medical History  Diagnosis Date  . Hypertension   . Brain aneurysm   . Cancer   . Rotator cuff syndrome of left shoulder   . DDD (degenerative disc disease), lumbar   . Chronic back pain     Past Surgical History  Procedure Date  . Cholecystectomy   . Brain surgery   . Breast surgery   . Total knee arthroplasty     right  .med History reviewed. No pertinent family history.  Social History:  reports that she has never smoked. She does not have any smokeless tobacco history on file. She reports that she does not drink alcohol or use illicit drugs.  Allergies:  Allergies  Allergen Reactions  . Latex Swelling    Discoloration of skin.     Medications: I have reviewed the patient's current medications.  Results for orders placed during the hospital encounter of 07/15/12 (from the past 48 hour(s))  CBC WITH DIFFERENTIAL     Status: Abnormal   Collection Time   07/15/12 11:34 AM      Component Value Range Comment   WBC 7.0  4.0 - 10.5 K/uL    RBC 5.01  3.87 - 5.11 MIL/uL    Hemoglobin 15.6 (*) 12.0 - 15.0 g/dL    HCT 46.8 (*) 36.0 - 46.0 %    MCV 93.4  78.0 - 100.0 fL    MCH 31.1  26.0 - 34.0 pg    MCHC 33.3  30.0 - 36.0 g/dL    RDW 14.7  11.5 - 15.5 %    Platelets 106 (*) 150 - 400 K/uL    Neutrophils Relative 82 (*) 43 - 77 %    Neutro Abs 5.7  1.7 - 7.7 K/uL    Lymphocytes Relative 10 (*) 12 - 46 %    Lymphs Abs 0.7  0.7 - 4.0 K/uL    Monocytes Relative 7  3 - 12 %    Monocytes Absolute 0.5  0.1 - 1.0 K/uL    Eosinophils Relative 0  0 - 5 %    Eosinophils Absolute 0.0  0.0 - 0.7 K/uL    Basophils Relative 1  0 - 1 %    Basophils Absolute 0.1  0.0 - 0.1 K/uL   BASIC METABOLIC PANEL     Status: Abnormal   Collection Time   07/15/12 11:34 AM  Component Value Range Comment   Sodium 140  135 - 145 mEq/L    Potassium 2.8 (*) 3.5 - 5.1 mEq/L    Chloride 99  96 - 112 mEq/L    CO2 24  19 - 32 mEq/L    Glucose, Bld 168 (*) 70 - 99 mg/dL    BUN 7  6 - 23 mg/dL    Creatinine, Ser 0.49 (*) 0.50 - 1.10 mg/dL    Calcium 9.0  8.4 - 10.5 mg/dL    GFR calc non Af Amer >90  >90 mL/min    GFR calc Af Amer >90  >90 mL/min   ETHANOL     Status: Normal   Collection Time   07/15/12 11:34 AM      Component Value Range Comment   Alcohol, Ethyl (B) <11  0 - 11 mg/dL   COMPREHENSIVE METABOLIC PANEL     Status: Abnormal   Collection Time   07/15/12 12:51 PM      Component Value Range Comment   Sodium 138  135 - 145 mEq/L    Potassium 3.0 (*) 3.5 - 5.1 mEq/L    Chloride 100  96 - 112 mEq/L    CO2 24  19 - 32 mEq/L    Glucose, Bld 123 (*) 70 - 99 mg/dL    BUN 7  6 - 23 mg/dL    Creatinine, Ser 0.49 (*) 0.50 - 1.10 mg/dL    Calcium 8.9  8.4 - 10.5 mg/dL    Total Protein 7.5  6.0 - 8.3 g/dL    Albumin 3.1 (*) 3.5 - 5.2 g/dL    AST 118 (*) 0 - 37 U/L    ALT 29  0 - 35 U/L    Alkaline Phosphatase 240 (*) 39 - 117 U/L    Total Bilirubin 3.3 (*) 0.3  - 1.2 mg/dL    GFR calc non Af Amer >90  >90 mL/min    GFR calc Af Amer >90  >90 mL/min   LIPASE, BLOOD     Status: Normal   Collection Time   07/15/12 12:51 PM      Component Value Range Comment   Lipase 30  11 - 59 U/L   CBC WITH DIFFERENTIAL     Status: Abnormal   Collection Time   07/15/12 12:51 PM      Component Value Range Comment   WBC 6.6  4.0 - 10.5 K/uL    RBC 4.82  3.87 - 5.11 MIL/uL    Hemoglobin 14.9  12.0 - 15.0 g/dL    HCT 42.9  36.0 - 46.0 %    MCV 89.0  78.0 - 100.0 fL    MCH 30.9  26.0 - 34.0 pg    MCHC 34.7  30.0 - 36.0 g/dL    RDW 14.4  11.5 - 15.5 %    Platelets 102 (*) 150 - 400 K/uL    Neutrophils Relative 80 (*) 43 - 77 %    Neutro Abs 5.3  1.7 - 7.7 K/uL    Lymphocytes Relative 9 (*) 12 - 46 %    Lymphs Abs 0.6 (*) 0.7 - 4.0 K/uL    Monocytes Relative 10  3 - 12 %    Monocytes Absolute 0.7  0.1 - 1.0 K/uL    Eosinophils Relative 0  0 - 5 %    Eosinophils Absolute 0.0  0.0 - 0.7 K/uL    Basophils Relative 1  0 - 1 %  Basophils Absolute 0.1  0.0 - 0.1 K/uL   URINALYSIS, ROUTINE W REFLEX MICROSCOPIC     Status: Abnormal   Collection Time   07/15/12 11:21 PM      Component Value Range Comment   Color, Urine ORANGE (*) YELLOW BIOCHEMICALS MAY BE AFFECTED BY COLOR   APPearance CLEAR  CLEAR    Specific Gravity, Urine 1.010  1.005 - 1.030    pH 7.0  5.0 - 8.0    Glucose, UA NEGATIVE  NEGATIVE mg/dL    Hgb urine dipstick SMALL (*) NEGATIVE    Bilirubin Urine SMALL (*) NEGATIVE    Ketones, ur NEGATIVE  NEGATIVE mg/dL    Protein, ur 100 (*) NEGATIVE mg/dL    Urobilinogen, UA 2.0 (*) 0.0 - 1.0 mg/dL    Nitrite NEGATIVE  NEGATIVE    Leukocytes, UA NEGATIVE  NEGATIVE   URINE MICROSCOPIC-ADD ON     Status: Abnormal   Collection Time   07/15/12 11:21 PM      Component Value Range Comment   Squamous Epithelial / LPF MANY (*) RARE    WBC, UA 0-2  <3 WBC/hpf    RBC / HPF 0-2  <3 RBC/hpf    Bacteria, UA RARE  RARE    Casts HYALINE CASTS (*) NEGATIVE   CBC      Status: Abnormal   Collection Time   07/16/12  4:57 AM      Component Value Range Comment   WBC 7.3  4.0 - 10.5 K/uL    RBC 4.50  3.87 - 5.11 MIL/uL    Hemoglobin 13.9  12.0 - 15.0 g/dL    HCT 42.8  36.0 - 46.0 %    MCV 95.1  78.0 - 100.0 fL    MCH 30.9  26.0 - 34.0 pg    MCHC 32.5  30.0 - 36.0 g/dL    RDW 14.8  11.5 - 15.5 %    Platelets 85 (*) 150 - 400 K/uL   BASIC METABOLIC PANEL     Status: Abnormal   Collection Time   07/16/12  4:57 AM      Component Value Range Comment   Sodium 141  135 - 145 mEq/L    Potassium 2.8 (*) 3.5 - 5.1 mEq/L    Chloride 103  96 - 112 mEq/L    CO2 30  19 - 32 mEq/L    Glucose, Bld 118 (*) 70 - 99 mg/dL    BUN 5 (*) 6 - 23 mg/dL    Creatinine, Ser 0.59  0.50 - 1.10 mg/dL    Calcium 7.9 (*) 8.4 - 10.5 mg/dL    GFR calc non Af Amer >90  >90 mL/min    GFR calc Af Amer >90  >90 mL/min   HEPATIC FUNCTION PANEL     Status: Abnormal   Collection Time   07/16/12  5:30 AM      Component Value Range Comment   Total Protein 6.8  6.0 - 8.3 g/dL    Albumin 2.8 (*) 3.5 - 5.2 g/dL    AST 99 (*) 0 - 37 U/L    ALT 25  0 - 35 U/L    Alkaline Phosphatase 204 (*) 39 - 117 U/L    Total Bilirubin 2.6 (*) 0.3 - 1.2 mg/dL    Bilirubin, Direct 1.1 (*) 0.0 - 0.3 mg/dL    Indirect Bilirubin 1.5 (*) 0.3 - 0.9 mg/dL   BASIC METABOLIC PANEL     Status: Abnormal  Collection Time   07/17/12  5:28 AM      Component Value Range Comment   Sodium 138  135 - 145 mEq/L    Potassium 3.3 (*) 3.5 - 5.1 mEq/L    Chloride 102  96 - 112 mEq/L    CO2 26  19 - 32 mEq/L    Glucose, Bld 84  70 - 99 mg/dL    BUN 4 (*) 6 - 23 mg/dL    Creatinine, Ser 0.61  0.50 - 1.10 mg/dL    Calcium 7.7 (*) 8.4 - 10.5 mg/dL    GFR calc non Af Amer >90  >90 mL/min    GFR calc Af Amer >90  >90 mL/min   HEPATIC FUNCTION PANEL     Status: Abnormal   Collection Time   07/17/12  5:28 AM      Component Value Range Comment   Total Protein 6.3  6.0 - 8.3 g/dL    Albumin 2.5 (*) 3.5 - 5.2 g/dL    AST  117 (*) 0 - 37 U/L    ALT 28  0 - 35 U/L    Alkaline Phosphatase 198 (*) 39 - 117 U/L    Total Bilirubin 3.1 (*) 0.3 - 1.2 mg/dL    Bilirubin, Direct 1.5 (*) 0.0 - 0.3 mg/dL    Indirect Bilirubin 1.6 (*) 0.3 - 0.9 mg/dL     Dg Chest 2 View  07/15/2012  *RADIOLOGY REPORT*  Clinical Data: Vomiting, past history breast cancer  CHEST - 2 VIEW  Comparison: 11/30/2008  Findings: Upper-normal size of cardiac silhouette. Tortuous aorta. Pulmonary vascularity normal. Minimal right base atelectasis. Lungs otherwise clear. No pleural effusion or pneumothorax. Surgical clips right axilla.  IMPRESSION: Minimal right basilar atelectasis.   Original Report Authenticated By: Burnetta Sabin, M.D.    Ct Abdomen Pelvis W Contrast  07/15/2012  *RADIOLOGY REPORT*  Clinical Data: Abdominal pain.  Hematemesis.  Out colon views. Breast cancer.  CT ABDOMEN AND PELVIS WITH CONTRAST  Technique:  Multidetector CT imaging of the abdomen and pelvis was performed following the standard protocol during bolus administration of intravenous contrast.  Contrast: 142mL OMNIPAQUE IOHEXOL 300 MG/ML  SOLN  Comparison: None.  Findings: Hepatic cirrhosis is demonstrated with numerous diffuse less than 1 cm low attenuation nodules throughout the hepatic parenchyma.  No dominant or hypovascular liver masses are identified.  No evidence of splenomegaly or ascites.  No evidence of portal vein thrombosis.  Prior cholecystectomy noted.  The pancreas, adrenal glands, and kidneys are normal in appearance. No evidence of hydronephrosis.  No abdominal lymphadenopathy identified.  Several small uterine fibroids are seen, largest measuring approximately 0.5 cm.  Adnexa are unremarkable.  No other soft tissue masses or lymphadenopathy identified.  Diverticulosis is again seen involving the colon diffusely, however there is no evidence of diverticulitis.  No other inflammatory process or abnormal fluid collections identified.  IMPRESSION:  1.  No acute  findings. 2.  Diverticulosis.  No radiographic evidence of diverticulitis. 3.  Small uterine fibroids, largest measuring approximately 3.5 cm. 4.  Micronodular hepatic cirrhosis.  No definite liver mass identified.  No evidence of splenomegaly or ascites.   Original Report Authenticated By: Marlaine Hind, M.D.     ROS Blood pressure 151/84, pulse 75, temperature 98.6 F (37 C), temperature source Oral, resp. rate 18, height 5\' 3"  (1.6 m), weight 156 lb 8.4 oz (71 kg), SpO2 91.00%. Physical ExamAlert and oriented. Skin warm and dry. Oral mucosa is moist.   .  Sclera anicteric, conjunctivae is pink. Thyroid not enlarged. No cervical lymphadenopathy. Lungs clear. Heart regular rate and rhythm.  Abdomen is soft. Bowel sounds are positive. Liver  8-9 fingerbreadths below mid clavicular line.    No abdominal tenderness.  No edema to lower extremities.    Assessment/Plan:Elevateed liver enzymes probably from alcoholic hepatitis.  I discussed this case with Dr. Laural Golden. Will need Hepatitis Panel, PT/INR. Agree with supplemental potassium for her hypokalemia.   Jackson,Savannah W 07/17/2012, 8:33 AM     GI attending note. Patient interviewed and examined. Lab studies on abdominopelvic CT reviewed. She appears to have acute alcoholic hepatitis. Remains to be seen whether or not she has underlying cirrhosis. I'm not convinced that she has. Her withdrawal symptoms appears to have been well controlled with lorazepam. We have requested viral markers. Discriminant factor is 25.3. She therefore does not need prednisone. Will advance diet to 4 g sodium diet. Patient advised not to take Tylenol when she goes home. Condition reviewed with her daughter.

## 2012-07-17 NOTE — Clinical Social Work Note (Signed)
CSW met w patient to review alcohol abuse and caregiver stress.  Handouts given and discussed.  Patient endorsed symptoms of depression, including depressed mood, sleeplessness, tearfulness.  Patient will be given referrals for outpatient therapists in [facility].  Encouraged patient to work w her social support system of family and friends as she works to achieve abstinence and deal w depression.  Patient mentioned that husband is now in ICU at [facility] and is scheduled for additional amputation of leg to deal w infection.  Edwyna Shell, LCSW Clinical Social Worker [phone])

## 2012-07-17 NOTE — Progress Notes (Signed)
Subjective: Patient feels slightly better. She has nausea and abdominal pain, but no vomiting Objective: Vital signs in last 24 hours: Temp:  [98 F (36.7 C)-98.9 F (37.2 C)] 98.6 F (37 C) (09/18 [id number]) Pulse Rate:  [65-78] 75  (09/18 0633) Resp:  [17-18] 18  (09/18 0633) BP: (151-166)/(84-104) 151/84 mmHg (09/18 0633) SpO2:  [91 %-92 %] 91 % (09/18 [id number]) Weight change:  Last BM Date: 07/15/12  Intake/Output from previous day: 09/17 0701 - 09/18 0700 In: 1785 [P.O.:210; I.V.:1575] Out: 1250 [Urine:1250]  PHYSICAL EXAM General appearance: alert and no distress Resp: rhonchi bibasilar Cardio: S1, S2 normal GI: abnormal findings:  hepatomegaly, liver edge palpable 4 cm below costal margin Extremities: extremities normal, atraumatic, no cyanosis or edema  Lab Results:    @labtest @ ABGS No results found for this basename: PHART,PCO2,PO2ART,TCO2,HCO3 in the last 72 hours CULTURES No results found for this or any previous visit (from the past 240 hour(s)). Studies/Results: Dg Chest 2 View  07/15/2012  *RADIOLOGY REPORT*  Clinical Data: Vomiting, past history breast cancer  CHEST - 2 VIEW  Comparison: 11/30/2008  Findings: Upper-normal size of cardiac silhouette. Tortuous aorta. Pulmonary vascularity normal. Minimal right base atelectasis. Lungs otherwise clear. No pleural effusion or pneumothorax. Surgical clips right axilla.  IMPRESSION: Minimal right basilar atelectasis.   Original Report Authenticated By: Burnetta Sabin, M.D.    Ct Abdomen Pelvis W Contrast  07/15/2012  *RADIOLOGY REPORT*  Clinical Data: Abdominal pain.  Hematemesis.  Out colon views. Breast cancer.  CT ABDOMEN AND PELVIS WITH CONTRAST  Technique:  Multidetector CT imaging of the abdomen and pelvis was performed following the standard protocol during bolus administration of intravenous contrast.  Contrast: 159mL OMNIPAQUE IOHEXOL 300 MG/ML  SOLN  Comparison: None.  Findings: Hepatic cirrhosis is demonstrated with  numerous diffuse less than 1 cm low attenuation nodules throughout the hepatic parenchyma.  No dominant or hypovascular liver masses are identified.  No evidence of splenomegaly or ascites.  No evidence of portal vein thrombosis.  Prior cholecystectomy noted.  The pancreas, adrenal glands, and kidneys are normal in appearance. No evidence of hydronephrosis.  No abdominal lymphadenopathy identified.  Several small uterine fibroids are seen, largest measuring approximately 0.5 cm.  Adnexa are unremarkable.  No other soft tissue masses or lymphadenopathy identified.  Diverticulosis is again seen involving the colon diffusely, however there is no evidence of diverticulitis.  No other inflammatory process or abnormal fluid collections identified.  IMPRESSION:  1.  No acute findings. 2.  Diverticulosis.  No radiographic evidence of diverticulitis. 3.  Small uterine fibroids, largest measuring approximately 3.5 cm. 4.  Micronodular hepatic cirrhosis.  No definite liver mass identified.  No evidence of splenomegaly or ascites.   Original Report Authenticated By: Marlaine Hind, M.D.     Medications: I have reviewed the patient's current medications.  Assesment: 1.Abnormal LFT 2 . Liver cirrhosis  3. Hypokalemia- improving 4. Alcohol withdrawal syndrome  5.Anxiety disorder  6. H/O hypertension  Active Problems:  * No active hospital problems. *     Plan: Continue iv fluid Continue to replace K+ GI consult pending Clear liquid diet    LOS: 2 days   [name] 07/17/2012, 7:50 AM

## 2012-07-17 NOTE — Progress Notes (Signed)
Resident's B/P 166/104 at 2200. Paged Dr. Karie Kirks, on call for Dr. Legrand Rams. Gave new order for Furosemide 20 mg IV Once. Given, resident tolerated well. Will continue to monitor.

## 2012-07-18 LAB — BASIC METABOLIC PANEL
CO2: 23 mEq/L (ref 19–32)
Chloride: 107 mEq/L (ref 96–112)
GFR calc Af Amer: >90 mL/min (ref >90)
Potassium: 3.4 mEq/L — ABNORMAL LOW (ref 3.5–5.1)
Sodium: 139 mEq/L (ref 135–145)

## 2012-07-18 LAB — CBC
HCT: 44.6 % (ref 36.0–46.0)
Hemoglobin: 14.6 g/dL (ref 12.0–15.0)
MCV: 94.9 fL (ref 78.0–100.0)
RBC: 4.7 MIL/uL (ref 3.87–5.11)
RDW: 14.7 % (ref 11.5–15.5)
WBC: 7.8 10*3/uL (ref 4.0–10.5)

## 2012-07-18 LAB — HEPATIC FUNCTION PANEL
ALT: 28 U/L (ref 0–35)
Bilirubin, Direct: 1.4 mg/dL — ABNORMAL HIGH (ref 0.0–0.3)
Indirect Bilirubin: 1.4 mg/dL — ABNORMAL HIGH (ref 0.3–0.9)
Total Protein: 6.4 g/dL (ref 6.0–8.3)

## 2012-07-18 MED ORDER — LISINOPRIL 20 MG PO TABS: 20.0000 mg | ORAL_TABLET | Freq: Every day | ORAL | Status: DC

## 2012-07-18 MED ORDER — THIAMINE HCL 100 MG PO TABS: 100.0000 mg | ORAL_TABLET | Freq: Every day | ORAL | Status: DC

## 2012-07-18 MED ORDER — FOLIC ACID 1 MG PO TABS: 1.0000 mg | ORAL_TABLET | Freq: Every day | ORAL | Status: DC

## 2012-07-18 NOTE — Discharge Summary (Signed)
Physician Discharge Summary  Patient ID: Savannah Jackson MRN: [id number] DOB/AGE: 30-Nov-1955 56 y.o. Primary Care Physician:Tani Virgo, MD Admit date: 07/15/2012 Discharge date: 07/18/2012    Discharge Diagnoses:   1. Alcoholic hepatitis 2 . Liver cirrhosis  3. Hypokalemia- improving  4. Alcohol withdrawal syndrome  5.Anxiety disorder  6. H/O hypertension  Active Problems:  * No active hospital problems. *      Medication List     As of 07/18/2012  8:12 AM    TAKE these medications         BC FAST PAIN RELIEF 845-65 MG Pack   Generic drug: Aspirin-Caffeine   Take 1 packet by mouth every 6 (six) hours as needed. Pain      folic acid 1 MG tablet   Commonly known as: FOLVITE   Take 1 tablet (1 mg total) by mouth daily.      lisinopril 20 MG tablet   Commonly known as: PRINIVIL,ZESTRIL   Take 1 tablet (20 mg total) by mouth daily.      methocarbamol 500 MG tablet   Commonly known as: ROBAXIN   Take 500 mg by mouth daily as needed. Muscle Spasms      metoprolol 50 MG tablet   Commonly known as: LOPRESSOR   Take 50 mg by mouth 2 (two) times daily.      tetrahydrozoline-zinc 0.05-0.25 % ophthalmic solution   Commonly known as: VISINE-AC   Place 2 drops into both eyes 3 (three) times daily as needed. Dry Eyes      thiamine 100 MG tablet   Take 1 tablet (100 mg total) by mouth daily.        Discharged Condition:* stable   Consults: GI Significant Diagnostic Studies: Dg Chest 2 View  07/15/2012  *RADIOLOGY REPORT*  Clinical Data: Vomiting, past history breast cancer  CHEST - 2 VIEW  Comparison: 11/30/2008  Findings: Upper-normal size of cardiac silhouette. Tortuous aorta. Pulmonary vascularity normal. Minimal right base atelectasis. Lungs otherwise clear. No pleural effusion or pneumothorax. Surgical clips right axilla.  IMPRESSION: Minimal right basilar atelectasis.   Original Report Authenticated By: Burnetta Sabin, M.D.    Ct Abdomen Pelvis W  Contrast  07/15/2012  *RADIOLOGY REPORT*  Clinical Data: Abdominal pain.  Hematemesis.  Out colon views. Breast cancer.  CT ABDOMEN AND PELVIS WITH CONTRAST  Technique:  Multidetector CT imaging of the abdomen and pelvis was performed following the standard protocol during bolus administration of intravenous contrast.  Contrast: 116mL OMNIPAQUE IOHEXOL 300 MG/ML  SOLN  Comparison: None.  Findings: Hepatic cirrhosis is demonstrated with numerous diffuse less than 1 cm low attenuation nodules throughout the hepatic parenchyma.  No dominant or hypovascular liver masses are identified.  No evidence of splenomegaly or ascites.  No evidence of portal vein thrombosis.  Prior cholecystectomy noted.  The pancreas, adrenal glands, and kidneys are normal in appearance. No evidence of hydronephrosis.  No abdominal lymphadenopathy identified.  Several small uterine fibroids are seen, largest measuring approximately 0.5 cm.  Adnexa are unremarkable.  No other soft tissue masses or lymphadenopathy identified.  Diverticulosis is again seen involving the colon diffusely, however there is no evidence of diverticulitis.  No other inflammatory process or abnormal fluid collections identified.  IMPRESSION:  1.  No acute findings. 2.  Diverticulosis.  No radiographic evidence of diverticulitis. 3.  Small uterine fibroids, largest measuring approximately 3.5 cm. 4.  Micronodular hepatic cirrhosis.  No definite liver mass identified.  No evidence of splenomegaly or ascites.  Original Report Authenticated By: Marlaine Hind, M.D.     Lab Results: Basic Metabolic Panel:  Basename 07/18/12 0519 07/17/12 0528  NA 139 138  K 3.4* 3.3*  CL 107 102  CO2 23 26  GLUCOSE 87 84  BUN 4* 4*  CREATININE 0.56 0.61  CALCIUM 7.9* 7.7*  MG -- --  PHOS -- --   Liver Function Tests:  [facility] 07/18/12 0519 07/17/12 0528  AST 101* 117*  ALT 28 28  ALKPHOS 177* 198*  BILITOT 2.8* 3.1*  PROT 6.4 6.3  ALBUMIN 2.5* 2.5*      CBC:  Basename 07/18/12 0519 07/16/12 0457 07/15/12 1251 07/15/12 1134  WBC 7.8 7.3 -- --  NEUTROABS -- -- 5.3 5.7  HGB 14.6 13.9 -- --  HCT 44.6 42.8 -- --  MCV 94.9 95.1 -- --  PLT 81* 85* -- --    No results found for this or any previous visit (from the past 240 hour(s)).   Hospital Course:  This is a 56 years old female patient with with history of hypertension and alcohol abuse was admitted due to alcoholic hepatitis, alcohol withdraw symptom and hypokalemia. Patient was treated and her K+ was replaced . She was evaluated by gastroenterologist. She improved and discharge home in stable condition. Discharge Exam: Blood pressure 167/95, pulse 70, temperature 98.8 F (37.1 C), temperature source Oral, resp. rate 20, height 5\' 3"  (1.6 m), weight 71 kg (156 lb 8.4 oz), SpO2 93.00%. * Disposition:  Home       Follow-up Information    Follow up with [facility], [place]. In 1 week.   Contact information:   [place] [id number] [phone]          Signed: Amarylis Rovito   07/18/2012, 8:12 AM

## 2012-07-18 NOTE — Progress Notes (Signed)
Patient discharged home with family.  Follow up appointment in place.  Instructed on new medications - verbalizes understanding.  IV removed - WNL.  Patient stable for discharge

## 2012-07-18 NOTE — Progress Notes (Signed)
Patient ID: Savannah Jackson, female   DOB: 1956-03-24, 56 y.o.   MRN: [id number] She says she feels much better. She says she will drink etoh.  She needs an  OV in 4 weeks.

## 2012-07-18 NOTE — Clinical Social Work Note (Signed)
CSW met w patient at bedside and provided referral to OP txist, encouraged patient to seek support for caregiver stress and continued abstinence.    Edwyna Shell, LCSW Clinical Social Worker [phone])

## 2012-09-05 ENCOUNTER — Inpatient Hospital Stay (HOSPITAL_COMMUNITY)
Admission: EM | Admit: 2012-09-05 | Discharge: 2012-09-09 | DRG: 194 | Disposition: A | Discharge status: Home / Self Care | Payer: Medicare HMO | Attending: Internal Medicine | Admitting: Internal Medicine

## 2012-09-05 ENCOUNTER — Emergency Department (HOSPITAL_COMMUNITY): Payer: Medicare HMO

## 2012-09-05 ENCOUNTER — Encounter (HOSPITAL_COMMUNITY): Payer: Self-pay | Admitting: Emergency Medicine

## 2012-09-05 DIAGNOSIS — F430 Acute stress reaction: Secondary | ICD-10-CM | POA: Diagnosis present

## 2012-09-05 DIAGNOSIS — Z79899 Other long term (current) drug therapy: Secondary | ICD-10-CM

## 2012-09-05 DIAGNOSIS — Z853 Personal history of malignant neoplasm of breast: Secondary | ICD-10-CM

## 2012-09-05 DIAGNOSIS — Z9221 Personal history of antineoplastic chemotherapy: Secondary | ICD-10-CM

## 2012-09-05 DIAGNOSIS — K7030 Alcoholic cirrhosis of liver without ascites: Secondary | ICD-10-CM | POA: Diagnosis present

## 2012-09-05 DIAGNOSIS — I10 Essential (primary) hypertension: Secondary | ICD-10-CM | POA: Diagnosis present

## 2012-09-05 DIAGNOSIS — Z7982 Long term (current) use of aspirin: Secondary | ICD-10-CM

## 2012-09-05 DIAGNOSIS — J189 Pneumonia, unspecified organism: Principal | ICD-10-CM | POA: Diagnosis present

## 2012-09-05 DIAGNOSIS — R109 Unspecified abdominal pain: Secondary | ICD-10-CM

## 2012-09-05 DIAGNOSIS — Z9089 Acquired absence of other organs: Secondary | ICD-10-CM

## 2012-09-05 DIAGNOSIS — Z91040 Latex allergy status: Secondary | ICD-10-CM

## 2012-09-05 DIAGNOSIS — D259 Leiomyoma of uterus, unspecified: Secondary | ICD-10-CM | POA: Diagnosis present

## 2012-09-05 DIAGNOSIS — M51379 Other intervertebral disc degeneration, lumbosacral region without mention of lumbar back pain or lower extremity pain: Secondary | ICD-10-CM | POA: Diagnosis present

## 2012-09-05 DIAGNOSIS — C228 Malignant neoplasm of liver, primary, unspecified as to type: Secondary | ICD-10-CM | POA: Diagnosis present

## 2012-09-05 DIAGNOSIS — Z96659 Presence of unspecified artificial knee joint: Secondary | ICD-10-CM

## 2012-09-05 DIAGNOSIS — R160 Hepatomegaly, not elsewhere classified: Secondary | ICD-10-CM

## 2012-09-05 DIAGNOSIS — M5137 Other intervertebral disc degeneration, lumbosacral region: Secondary | ICD-10-CM | POA: Diagnosis present

## 2012-09-05 DIAGNOSIS — F1020 Alcohol dependence, uncomplicated: Secondary | ICD-10-CM | POA: Diagnosis present

## 2012-09-05 DIAGNOSIS — G8929 Other chronic pain: Secondary | ICD-10-CM | POA: Diagnosis present

## 2012-09-05 LAB — COMPREHENSIVE METABOLIC PANEL
Alkaline Phosphatase: 227 U/L — ABNORMAL HIGH (ref 39–117)
BUN: 5 mg/dL — ABNORMAL LOW (ref 6–23)
Creatinine, Ser: 0.55 mg/dL (ref 0.50–1.10)
GFR calc Af Amer: >90 mL/min (ref >90)
Glucose, Bld: 91 mg/dL (ref 70–99)
Potassium: 3.5 mEq/L (ref 3.5–5.1)
Total Bilirubin: 3.3 mg/dL — ABNORMAL HIGH (ref 0.3–1.2)
Total Protein: 7.5 g/dL (ref 6.0–8.3)

## 2012-09-05 LAB — LIPASE, BLOOD: Lipase: 25 U/L (ref 11–59)

## 2012-09-05 LAB — CBC WITH DIFFERENTIAL/PLATELET
Eosinophils Absolute: 0.1 10*3/uL (ref 0.0–0.7)
HCT: 47 % — ABNORMAL HIGH (ref 36.0–46.0)
Hemoglobin: 15.9 g/dL — ABNORMAL HIGH (ref 12.0–15.0)
Lymphs Abs: 1.1 10*3/uL (ref 0.7–4.0)
MCH: 31.4 pg (ref 26.0–34.0)
MCHC: 33.8 g/dL (ref 30.0–36.0)
MCV: 92.7 fL (ref 78.0–100.0)
Monocytes Absolute: 0.8 10*3/uL (ref 0.1–1.0)
Monocytes Relative: 13 % — ABNORMAL HIGH (ref 3–12)
Neutrophils Relative %: 66 % (ref 43–77)
RBC: 5.07 MIL/uL (ref 3.87–5.11)

## 2012-09-05 LAB — LACTIC ACID, PLASMA: Lactic Acid, Venous: 1.1 mmol/L (ref 0.5–2.2)

## 2012-09-05 LAB — URINALYSIS, ROUTINE W REFLEX MICROSCOPIC
Bilirubin Urine: NEGATIVE
Protein, ur: 30 mg/dL — AB
Urobilinogen, UA: 1 mg/dL (ref 0.0–1.0)

## 2012-09-05 MED ORDER — SODIUM CHLORIDE 0.9 % IV SOLN
INTRAVENOUS | Status: AC
  Administered 2012-09-05: 13:00:00 via INTRAVENOUS

## 2012-09-05 MED ORDER — ONDANSETRON HCL 4 MG/2ML IJ SOLN
4.0000 mg | Freq: Once | INTRAMUSCULAR | Status: AC
  Administered 2012-09-05: 4 mg via INTRAVENOUS
  Filled 2012-09-05: qty 2

## 2012-09-05 MED ORDER — SODIUM CHLORIDE 0.9 % IV BOLUS (SEPSIS)
1000.0000 mL | Freq: Once | INTRAVENOUS | Status: AC
  Administered 2012-09-05: 1000 mL via INTRAVENOUS

## 2012-09-05 MED ORDER — ALBUTEROL SULFATE (5 MG/ML) 0.5% IN NEBU: 2.5000 mg | INHALATION_SOLUTION | RESPIRATORY_TRACT | Status: AC | PRN

## 2012-09-05 MED ORDER — IOHEXOL 300 MG/ML  SOLN
100.0000 mL | Freq: Once | INTRAMUSCULAR | Status: AC | PRN
  Administered 2012-09-05: 100 mL via INTRAVENOUS

## 2012-09-05 MED ORDER — FOLIC ACID 1 MG PO TABS
1.0000 mg | ORAL_TABLET | Freq: Every day | ORAL | Status: DC
  Administered 2012-09-05 – 2012-09-09 (×5): 1 mg via ORAL
  Filled 2012-09-05 (×5): qty 1

## 2012-09-05 MED ORDER — DIPHENHYDRAMINE HCL 50 MG/ML IJ SOLN
25.0000 mg | Freq: Once | INTRAMUSCULAR | Status: AC
  Administered 2012-09-05: 25 mg via INTRAVENOUS
  Filled 2012-09-05: qty 1

## 2012-09-05 MED ORDER — HYDROMORPHONE HCL PF 1 MG/ML IJ SOLN
1.0000 mg | Freq: Once | INTRAMUSCULAR | Status: AC
  Administered 2012-09-05: 1 mg via INTRAVENOUS
  Filled 2012-09-05: qty 1

## 2012-09-05 MED ORDER — ONDANSETRON HCL 4 MG/2ML IJ SOLN: 4.0000 mg | Freq: Three times a day (TID) | INTRAMUSCULAR | Status: AC | PRN

## 2012-09-05 MED ORDER — DEXTROSE 5 % IV SOLN
500.0000 mg | INTRAVENOUS | Status: DC
  Administered 2012-09-05 – 2012-09-09 (×5): 500 mg via INTRAVENOUS
  Filled 2012-09-05 (×6): qty 500

## 2012-09-05 MED ORDER — LISINOPRIL 10 MG PO TABS
20.0000 mg | ORAL_TABLET | Freq: Every day | ORAL | Status: DC
  Administered 2012-09-05 – 2012-09-09 (×5): 20 mg via ORAL
  Filled 2012-09-05 (×5): qty 2

## 2012-09-05 MED ORDER — ENOXAPARIN SODIUM 40 MG/0.4ML ~~LOC~~ SOLN
40.0000 mg | SUBCUTANEOUS | Status: DC
  Administered 2012-09-05 – 2012-09-08 (×4): 40 mg via SUBCUTANEOUS
  Filled 2012-09-05 (×4): qty 0.4

## 2012-09-05 MED ORDER — DEXTROSE 5 % IV SOLN
1.0000 g | Freq: Once | INTRAVENOUS | Status: AC
  Administered 2012-09-05: 1 g via INTRAVENOUS
  Filled 2012-09-05: qty 10

## 2012-09-05 MED ORDER — METOPROLOL TARTRATE 50 MG PO TABS
50.0000 mg | ORAL_TABLET | Freq: Two times a day (BID) | ORAL | Status: DC
  Administered 2012-09-05 – 2012-09-09 (×9): 50 mg via ORAL
  Filled 2012-09-05 (×9): qty 1

## 2012-09-05 MED ORDER — VITAMIN B-1 100 MG PO TABS
100.0000 mg | ORAL_TABLET | Freq: Every day | ORAL | Status: DC
  Administered 2012-09-05 – 2012-09-09 (×5): 100 mg via ORAL
  Filled 2012-09-05 (×5): qty 1

## 2012-09-05 MED ORDER — DEXTROSE 5 % IV SOLN
1.0000 g | INTRAVENOUS | Status: DC
  Administered 2012-09-06 – 2012-09-09 (×4): 1 g via INTRAVENOUS
  Filled 2012-09-05 (×5): qty 10

## 2012-09-05 MED ORDER — DEXTROSE 5 % IV SOLN
500.0000 mg | Freq: Once | INTRAVENOUS | Status: DC
  Filled 2012-09-05: qty 500

## 2012-09-05 MED ORDER — HYDROMORPHONE HCL PF 1 MG/ML IJ SOLN: 1.0000 mg | INTRAMUSCULAR | Status: AC | PRN

## 2012-09-05 NOTE — Progress Notes (Signed)
ANTICOAGULATION CONSULT NOTE - Initial Consult  Pharmacy Consult for Lovenox Indication: VTE prophylaxis  Allergies  Allergen Reactions  . Latex Swelling    Discoloration of skin.     Patient Measurements: Height: 5\' 3"  (160 cm) IBW/kg (Calculated) : 52.4   Vital Signs: Temp: 98 F (36.7 C) (11/07 1254) Temp src: Oral (11/07 1254) BP: 159/96 mmHg (11/07 1254) Pulse Rate: 72  (11/07 1254)  Labs:  [facility] 09/05/12 0746  HGB 15.9*  HCT 47.0*  PLT 145*  APTT --  LABPROT --  INR --  HEPARINUNFRC --  CREATININE 0.55  CKTOTAL --  CKMB --  TROPONINI --    The CrCl is unknown because both a height and weight (above a minimum accepted value) are required for this calculation.   Medical History: Past Medical History  Diagnosis Date  . Hypertension   . Brain aneurysm   . Cancer   . Rotator cuff syndrome of left shoulder   . DDD (degenerative disc disease), lumbar   . Chronic back pain Diverticultis    Medications:  Scheduled:    . sodium chloride   Intravenous STAT  . azithromycin  500 mg Intravenous Q24H  . [COMPLETED] cefTRIAXone (ROCEPHIN)  IV  1 g Intravenous Once  . cefTRIAXone (ROCEPHIN)  IV  1 g Intravenous Q24H  . [COMPLETED] diphenhydrAMINE  25 mg Intravenous Once  . folic acid  1 mg Oral Daily  . [COMPLETED]  HYDROmorphone (DILAUDID) injection  1 mg Intravenous Once  . lisinopril  20 mg Oral Daily  . metoprolol  50 mg Oral BID  . [COMPLETED] ondansetron  4 mg Intravenous Once  . [COMPLETED] sodium chloride  1,000 mL Intravenous Once  . thiamine  100 mg Oral Daily  . [DISCONTINUED] azithromycin  500 mg Intravenous Once    Assessment: 56 yo F admitted with abdominal pain to start on Lovenox for VTE px while hospitalized.  Renal function at baseline.  She has hx liver cirrhosis with low baseline platelet counts.   Goal of Therapy:  Monitor platelets by anticoagulation protocol: Yes   Plan:  Lovenox 40mg  sq daily Monitor CBC & renal  function  Biagio Borg 09/05/2012,1:25 PM

## 2012-09-05 NOTE — ED Notes (Signed)
Pt presents with abdominal pain and n/v/d x1 day, notes the pain is in her lower right quadrant and is painful to palpation. No other complaints at this time.

## 2012-09-05 NOTE — ED Provider Notes (Signed)
History   This chart was scribed for Ezequiel Essex, MD by Marin Comment . The patient was seen in room APA05/APA05. Patient's care was started at 32.   CSN: [id number]  Arrival date & time 09/05/12  [id number]   First MD Initiated Contact with Patient 09/05/12 0710      Chief Complaint  Patient presents with  . Abdominal Pain  . Emesis    The history is provided by the patient. No language interpreter was used.   Savannah Jackson is a 56 y.o. female who presents to the Emergency Department complaining of lower abdominal pain that started yesterday. She reports associated subjective fever, nausea, vomiting, diarrhea, hematemesis. She states she noticed a clot of blood. She states she has had the abdominal pain previously with the last episode a month and a half ago. She reports a h/o diverticulitis and cholecystectomy. She also complains of SOB and a productive cough. She denies any chest pain, dysuria or hematuria. She reports her last drink of alcohol was 2 weeks ago.  Past Medical History  Diagnosis Date  . Hypertension   . Brain aneurysm   . Cancer   . Rotator cuff syndrome of left shoulder   . DDD (degenerative disc disease), lumbar   . Chronic back pain     Past Surgical History  Procedure Date  . Cholecystectomy   . Brain surgery   . Breast surgery   . Total knee arthroplasty     right    No family history on file.  History  Substance Use Topics  . Smoking status: Never Smoker   . Smokeless tobacco: Not on file  . Alcohol Use: No     Comment: stopped 07/10/12, was drinking daily     OB History    Grav Para Term Preterm Abortions TAB SAB Ect Mult Living                  Review of Systems  Constitutional: Positive for fever. Negative for chills.  Respiratory: Positive for cough and shortness of breath.   Gastrointestinal: Positive for nausea, vomiting, abdominal pain and diarrhea.  Neurological: Negative for weakness.  All other systems reviewed  and are negative.    Allergies  Latex  Home Medications   Current Outpatient Rx  Name  Route  Sig  Dispense  Refill  . ASPIRIN-CAFFEINE 845-65 MG PO PACK   Oral   Take 1 packet by mouth every 6 (six) hours as needed. Pain         . FOLIC ACID 1 MG PO TABS   Oral   Take 1 tablet (1 mg total) by mouth daily.   30 tablet   3   . LISINOPRIL 20 MG PO TABS   Oral   Take 1 tablet (20 mg total) by mouth daily.   30 tablet   3   . METHOCARBAMOL 500 MG PO TABS   Oral   Take 500 mg by mouth daily as needed. Muscle Spasms         . METOPROLOL TARTRATE 50 MG PO TABS   Oral   Take 50 mg by mouth 2 (two) times daily.         Bethann Humble SULFATE 0.05-0.25 % OP SOLN   Both Eyes   Place 2 drops into both eyes 3 (three) times daily as needed. Dry Eyes         . THIAMINE HCL 100 MG PO TABS   Oral   Take  1 tablet (100 mg total) by mouth daily.   30 tablet   3     BP 158/110  Pulse 83  Temp 98 F (36.7 C) (Oral)  Resp 22  Ht 5\' 3"  (1.6 m)  SpO2 96%  Physical Exam  Nursing note and vitals reviewed. Constitutional: She is oriented to person, place, and time. She appears well-developed and well-nourished. No distress.  HENT:  Head: Normocephalic and atraumatic.  Right Ear: External ear normal.  Left Ear: External ear normal.  Nose: Nose normal.  Mouth/Throat: Oropharynx is clear and moist. No oropharyngeal exudate.  Eyes: EOM are normal. Pupils are equal, round, and reactive to light.  Neck: Normal range of motion. Neck supple. No tracheal deviation present.  Cardiovascular: Normal rate, regular rhythm and normal heart sounds.   No murmur heard. Pulmonary/Chest: Effort normal. No respiratory distress. She has no wheezes. She has no rales.  Abdominal: Soft. Bowel sounds are normal. She exhibits no distension. There is hepatomegaly. There is tenderness. There is guarding. There is no CVA tenderness.       Epigastric and RLQ abdominal tenderness with  guarding. No CVA tenderness.   Musculoskeletal: Normal range of motion. She exhibits no edema.  Neurological: She is alert and oriented to person, place, and time.  Skin: Skin is warm and dry.  Psychiatric: She has a normal mood and affect. Her behavior is normal.    ED Course  Procedures (including critical care time)  DIAGNOSTIC STUDIES: Oxygen Saturation is 96% on room air, adequate by my interpretation.    COORDINATION OF CARE:  07:25-Discussed planned course of treatment with the patient including IV fluids, pain and nausea medication, who is agreeable at this time.   07:30-Medication Orders: Sodium chloride 0.9% bolus 1,000 mL-once; Ondansetron (Zofran) injection 4 mg-once; Hydromorphone (Dilaudid) injection 1 mg-once.   10:29-Recheck: Pt's liver feels enlarged. Daughter states Dr. Legrand Rams is aware of the enlarged liver.   Labs Reviewed  CBC WITH DIFFERENTIAL - Abnormal; Notable for the following:    Hemoglobin 15.9 (*)     HCT 47.0 (*)     Platelets 145 (*)     Monocytes Relative 13 (*)     Basophils Relative 2 (*)     All other components within normal limits  COMPREHENSIVE METABOLIC PANEL - Abnormal; Notable for the following:    BUN 5 (*)     Albumin 2.9 (*)     AST 88 (*)     Alkaline Phosphatase 227 (*)     Total Bilirubin 3.3 (*)     All other components within normal limits  URINALYSIS, ROUTINE W REFLEX MICROSCOPIC - Abnormal; Notable for the following:    Hgb urine dipstick TRACE (*)     Protein, ur 30 (*)     All other components within normal limits  LACTIC ACID, PLASMA  LIPASE, BLOOD  URINE MICROSCOPIC-ADD ON   Dg Chest 2 View  09/05/2012  *RADIOLOGY REPORT*  Clinical Data: Cough  CHEST - 2 VIEW  Comparison: 07/15/2012  Findings: Cardiomediastinal silhouette is stable. No pulmonary edema.  Mild left base retrocardiac atelectasis or infiltrate best seen on lateral view.  IMPRESSION: Left base retrocardiac mild atelectasis or infiltrate best is seen on  lateral view.  No pulmonary edema.   Original Report Authenticated By: Lahoma Crocker, M.D.    Ct Abdomen Pelvis W Contrast  09/05/2012  *RADIOLOGY REPORT*  Clinical Data: Abdominal pain, emesis.  CT ABDOMEN AND PELVIS WITH CONTRAST  Technique:  Multidetector CT  imaging of the abdomen and pelvis was performed following the standard protocol during bolus administration of intravenous contrast.  Contrast: 140mL OMNIPAQUE IOHEXOL 300 MG/ML  SOLN  Comparison: 07/15/2012  Findings: Cardiomegaly mild peripheral reticular opacities, in keeping with fibrosis.  Mild more confluent opacities within the left greater than right lung bases; atelectasis versus infiltrate. No pleural or pericardial effusion.  Hepatomegaly with nodular contour and heterogeneous attenuation, with numerous hypodense foci.  Unremarkable spleen, pancreas, adrenal glands.  Status post cholecystectomy.  No biliary ductal dilatation.  There are a couple hypodensities within the right kidney, too small further characterize.  Otherwise, symmetric renal enhancement.  No hydronephrosis or hydroureter.  No bowel obstruction.  Normal appendix.  Colonic diverticulosis. No overt CT evidence for diverticulitis.  No free intraperitoneal air or fluid.  No lymphadenopathy. Mild retro pancreatic fat stranding is similar to priors and may reflect sequelae of prior inflammation/pancreatitis.  Fibroid uterus.  No adnexal mass.  Multilevel degenerative changes of the imaged spine. No acute or aggressive appearing osseous lesion.  IMPRESSION: Mild left greater than right lung base opacities; atelectasis versus pneumonia.  Cirrhotic liver morphology.  Hepatocellular carcinoma cannot be excluded on this examination and liver MRI follow-up is recommended for surveillance imaging.  Colonic diverticulosis.  No CT evidence for diverticulitis.  Fibroid uterus.   Original Report Authenticated By: Carlos Levering, M.D.      No diagnosis found.    MDM  Abdominal pain,  nausea, vomiting diarrhea for the past one day. History of alcohol abuse but no drinking she lives by patient report. Also with shortness of breath and productive cough.  Large palpable, tender liver on exam. LFTs similar to previous. On ambulation, patient desaturated to 90% on room air became short of breath. She is given Rocephin and azithromycin for probable pneumonia. Patient informed to liver findings and she will need further testing. Admission discussed with Dr. Legrand Rams  I personally performed the services described in this documentation, which was scribed in my presence.  The recorded information has been reviewed and considered.       Ezequiel Essex, MD 09/05/12 1731

## 2012-09-06 MED ORDER — SODIUM CHLORIDE 0.9 % IJ SOLN
INTRAMUSCULAR | Status: AC
  Administered 2012-09-06: 10 mL
  Filled 2012-09-06: qty 3

## 2012-09-06 MED ORDER — ACETAMINOPHEN 325 MG PO TABS
650.0000 mg | ORAL_TABLET | Freq: Four times a day (QID) | ORAL | Status: DC | PRN
  Administered 2012-09-06: 650 mg via ORAL
  Filled 2012-09-06: qty 2

## 2012-09-06 MED ORDER — LOPERAMIDE HCL 2 MG PO CAPS
4.0000 mg | ORAL_CAPSULE | Freq: Three times a day (TID) | ORAL | Status: DC | PRN
  Administered 2012-09-06: 4 mg via ORAL
  Filled 2012-09-06: qty 2

## 2012-09-06 MED ORDER — SODIUM CHLORIDE 0.9 % IJ SOLN
INTRAMUSCULAR | Status: AC
  Administered 2012-09-06: 15:00:00
  Filled 2012-09-06: qty 3

## 2012-09-06 MED ORDER — HYDROCODONE-ACETAMINOPHEN 5-325 MG PO TABS
1.0000 | ORAL_TABLET | ORAL | Status: DC | PRN
  Administered 2012-09-06 – 2012-09-08 (×2): 1 via ORAL
  Filled 2012-09-06 (×3): qty 1

## 2012-09-06 MED ORDER — SODIUM CHLORIDE 0.9 % IJ SOLN
INTRAMUSCULAR | Status: AC
  Administered 2012-09-06: 16:00:00
  Filled 2012-09-06: qty 3

## 2012-09-06 NOTE — Progress Notes (Signed)
INITIAL ADULT NUTRITION ASSESSMENT Date: 09/06/2012   Time: 12:21 PM Reason for Assessment: Malnutrition Screen  ASSESSMENT: Female 56 y.o.  C/o abdominal pain, nausea and vomiting   Past Medical History  Diagnosis Date  . Hypertension   . Brain aneurysm   . Cancer   . Rotator cuff syndrome of left shoulder   . DDD (degenerative disc disease), lumbar   . Chronic back pain Diverticultis    Scheduled Meds:   . [COMPLETED] sodium chloride   Intravenous STAT  . azithromycin  500 mg Intravenous Q24H  . cefTRIAXone (ROCEPHIN)  IV  1 g Intravenous Q24H  . enoxaparin (LOVENOX) injection  40 mg Subcutaneous Q24H  . folic acid  1 mg Oral Daily  . lisinopril  20 mg Oral Daily  . metoprolol  50 mg Oral BID  . [COMPLETED] sodium chloride      . thiamine  100 mg Oral Daily  . [DISCONTINUED] azithromycin  500 mg Intravenous Once   Continuous Infusions:  PRN Meds:.[EXPIRED] albuterol, HYDROcodone-acetaminophen, [EXPIRED]  HYDROmorphone (DILAUDID) injection, loperamide, [EXPIRED] ondansetron (ZOFRAN) IV  Ht: 5\' 3"  (160 cm)  Wt:  156.8# (71kg)  Ideal Wt: 52.4 kg  % Ideal Wt: 136%  Usual Wt: Stable Wt Readings from Last 10 Encounters:  07/15/12 156 lb 8.4 oz (71 kg)  04/02/12 157 lb (71.215 kg)     There is no weight on file to calculate BMI.  Food/Nutrition Related Hx: Pt denies wt loss. Poor appetite currently po intake 25% and hx of alcohol abuse.    CMP     Component Value Date/Time   NA 138 09/05/2012 0746   K 3.5 09/05/2012 0746   CL 100 09/05/2012 0746   CO2 29 09/05/2012 0746   GLUCOSE 91 09/05/2012 0746   BUN 5* 09/05/2012 0746   CREATININE 0.55 09/05/2012 0746   CALCIUM 9.0 09/05/2012 0746   PROT 7.5 09/05/2012 0746   ALBUMIN 2.9* 09/05/2012 0746   AST 88* 09/05/2012 0746   ALT 22 09/05/2012 0746   ALKPHOS 227* 09/05/2012 0746   BILITOT 3.3* 09/05/2012 0746   GFRNONAA >90 09/05/2012 0746   GFRAA >90 09/05/2012 0746     Diet Order: General Regular  Supplements:  Folic Acid and Thiamine daily  IVF:    Estimated Nutritional Needs:   Kcal:1650-1900 kcal Protein:70-80 gr Fluid:81ml/kcal  NUTRITION DIAGNOSIS: -Inadequate oral intake (NI-2.1).  Status: Ongoing  RELATED TO: poor appetite  AS EVIDENCE BY: abdominal pain, nausea and vomiting  PTA and 25% meal intake  MONITORING/EVALUATION(Goals): Monitor meal and supplement intake labs and wt changes Goal: Pt to meet >/= 90% of their estimated nutrition needs; not* met  EDUCATION NEEDS: -No education needs identified at this time  INTERVENTION: Ensure Complete po BID, each supplement provides 350 kcal and 13 grams of protein.  Dietitian [phone]  DOCUMENTATION CODES Per approved criteria  -Not Applicable    Savannah Jackson 09/06/2012, 12:21 PM

## 2012-09-06 NOTE — Care Management Note (Unsigned)
    Page 1 of 1   09/09/2012     11:51:52 AM   CARE MANAGEMENT NOTE 09/09/2012  Patient:  Savannah Jackson, Savannah Jackson   Account Number:  [id number]  Date Initiated:  09/06/2012  Documentation initiated by:  Claretha Cooper  Subjective/Objective Assessment:   Pt admitted from home. Lives with her spouse. States she takes care of him but her daughter who is at the bedside takes care of her.     Action/Plan:   Plan to DC home. No HH needs anticipated or identified.   Anticipated DC Date:  09/08/2012   Anticipated DC Plan:  [place]  CM consult      Choice offered to / List presented to:             Status of service:  In process, will continue to follow Medicare Important Message given?  YES (If response is "NO", the following Medicare IM given date fields will be blank) Date Medicare IM given:  09/09/2012 Date Additional Medicare IM given:    Discharge Disposition:    Per UR Regulation:    If discussed at Long Length of Stay Meetings, dates discussed:    Comments:  09/06/12 Claretha Cooper RN [facility] CM

## 2012-09-06 NOTE — H&P (Signed)
Savannah Jackson MRN: [id number] DOB/AGE: December 08, 1955 56 y.o. Primary Care Physician:Macey Wurtz, MD Admit date: 09/05/2012 Chief Complaint:  Abdominal pain, nausea and vomiting HPI:  This is a 56 yrs old female patient with history of alcohol abuse and liver cirrhosis came to ER due to the above complaint. She was evaluated in Er and had CT of the abdomen which showed cirrhotic liver and opacity of the lung. Patient is started on Iv antibiotics and admitted.  Past Medical History  Diagnosis Date  . Hypertension   . Brain aneurysm   . Cancer   . Rotator cuff syndrome of left shoulder   . DDD (degenerative disc disease), lumbar   . Chronic back pain Diverticultis   Past Surgical History  Procedure Date  . Cholecystectomy   . Brain surgery   . Breast surgery   . Total knee arthroplasty     right        History reviewed. No pertinent family history.  Social History:  reports that she has never smoked. She does not have any smokeless tobacco history on file. She reports that she does not drink alcohol or use illicit drugs.   Allergies:  Allergies  Allergen Reactions  . Latex Swelling    Discoloration of skin.     Medications Prior to Admission  Medication Sig Dispense Refill  . Aspirin-Caffeine (BC FAST PAIN RELIEF) 845-65 MG PACK Take 1 packet by mouth every 6 (six) hours as needed. Pain      . folic acid (FOLVITE) 1 MG tablet Take 1 tablet (1 mg total) by mouth daily.  30 tablet  3  . lisinopril (PRINIVIL,ZESTRIL) 20 MG tablet Take 1 tablet (20 mg total) by mouth daily.  30 tablet  3  . methocarbamol (ROBAXIN) 500 MG tablet Take 500 mg by mouth daily as needed. Muscle Spasms      . metoprolol (LOPRESSOR) 50 MG tablet Take 50 mg by mouth 2 (two) times daily.      Marland Kitchen tetrahydrozoline-zinc (VISINE-AC) 0.05-0.25 % ophthalmic solution Place 2 drops into both eyes 3 (three) times daily as needed. Dry Eyes      . thiamine 100 MG tablet Take 1 tablet (100 mg total) by mouth  daily.  30 tablet  3       [id number] from the symptoms mentioned above,there are no other symptoms referable to all systems reviewed.  Physical Exam: Blood pressure 169/97, pulse 65, temperature 98 F (36.7 C), temperature source Oral, resp. rate 18, height 5\' 3"  (1.6 m), SpO2 91.00%. General Condition- Chronically sick looking HE ENT- neck supple, pupils equal and reactive Respiratory- bilateral rhonchi Decreased air entry CVS- S1 and S2 heard regular ABD- soft and lax, bowel sound +, large hepatomegaly EXT- no leg edema.     Basename 09/05/12 0746  WBC 6.1  NEUTROABS 4.0  HGB 15.9*  HCT 47.0*  MCV 92.7  PLT 145*    Basename 09/05/12 0746  NA 138  K 3.5  CL 100  CO2 29  GLUCOSE 91  BUN 5*  CREATININE 0.55  CALCIUM 9.0  MG --  lablast2(ast:2,ALT:2,alkphos:2,bilitot:2,prot:2,albumin:2)@    No results found for this or any previous visit (from the past 240 hour(s)).   Dg Chest 2 View  09/05/2012  *RADIOLOGY REPORT*  Clinical Data: Cough  CHEST - 2 VIEW  Comparison: 07/15/2012  Findings: Cardiomediastinal silhouette is stable. No pulmonary edema.  Mild left base retrocardiac atelectasis or infiltrate best seen on lateral view.  IMPRESSION: Left base retrocardiac mild atelectasis  or infiltrate best is seen on lateral view.  No pulmonary edema.   Original Report Authenticated By: Lahoma Crocker, M.D.    Ct Abdomen Pelvis W Contrast  09/05/2012  *RADIOLOGY REPORT*  Clinical Data: Abdominal pain, emesis.  CT ABDOMEN AND PELVIS WITH CONTRAST  Technique:  Multidetector CT imaging of the abdomen and pelvis was performed following the standard protocol during bolus administration of intravenous contrast.  Contrast: 149mL OMNIPAQUE IOHEXOL 300 MG/ML  SOLN  Comparison: 07/15/2012  Findings: Cardiomegaly mild peripheral reticular opacities, in keeping with fibrosis.  Mild more confluent opacities within the left greater than right lung bases; atelectasis versus infiltrate. No pleural  or pericardial effusion.  Hepatomegaly with nodular contour and heterogeneous attenuation, with numerous hypodense foci.  Unremarkable spleen, pancreas, adrenal glands.  Status post cholecystectomy.  No biliary ductal dilatation.  There are a couple hypodensities within the right kidney, too small further characterize.  Otherwise, symmetric renal enhancement.  No hydronephrosis or hydroureter.  No bowel obstruction.  Normal appendix.  Colonic diverticulosis. No overt CT evidence for diverticulitis.  No free intraperitoneal air or fluid.  No lymphadenopathy. Mild retro pancreatic fat stranding is similar to priors and may reflect sequelae of prior inflammation/pancreatitis.  Fibroid uterus.  No adnexal mass.  Multilevel degenerative changes of the imaged spine. No acute or aggressive appearing osseous lesion.  IMPRESSION: Mild left greater than right lung base opacities; atelectasis versus pneumonia.  Cirrhotic liver morphology.  Hepatocellular carcinoma cannot be excluded on this examination and liver MRI follow-up is recommended for surveillance imaging.  Colonic diverticulosis.  No CT evidence for diverticulitis.  Fibroid uterus.   Original Report Authenticated By: Carlos Levering, M.D.    Impression: 1.Pneumonia  2. Liver cirrhosis R/O hepatocellular carcinoma  4. H/O alcohol abuse  5. Hypertension  Active Problems:  * No active hospital problems. *      Plan:  continue IV antibiotics  -GI consult  -repeat Cbc,BMP, and LFT  -continue regular treatment   Sharronda Schweers Pager [phone] 09/06/2012, 7:43 AM

## 2012-09-06 NOTE — Progress Notes (Signed)
Subjective: Patient was admitted yesterday due to abdominal pain and vomiting. [name] has liver cirrhosis and also her Ct scan showed lung opacity which is suspicious of pneumonia. Patient was  started on antibiotics and admitted. GI consult requested  Objective: Vital signs in last 24 hours: Temp:  [97.4 F (36.3 C)-98 F (36.7 C)] 98 F (36.7 C) (11/08 0520) Pulse Rate:  [65-83] 65  (11/08 0520) Resp:  [18-20] 18  (11/08 0520) BP: (135-169)/(86-97) 169/97 mmHg (11/08 0520) SpO2:  [91 %-98 %] 91 % (11/08 0520) Weight change:  Last BM Date: 09/05/12  Intake/Output from previous day: 11/07 0701 - 11/08 0700 In: 896.9 [I.V.:896.9] Out: -   PHYSICAL EXAM General appearance: alert and no distress Resp: diminished breath sounds bibasilar Cardio: S1, S2 normal GI: RUQ tenderness, large hepatomegally Extremities: extremities normal, atraumatic, no cyanosis or edema  Lab Results:    @labtest @ [facility] No results found for this basename: PHART,PCO2,PO2ART,TCO2,HCO3 in the last 72 hours CULTURES No results found for this or any previous visit (from the past 240 hour(s)). Studies/Results: Dg Chest 2 View  09/05/2012  *RADIOLOGY REPORT*  Clinical Data: Cough  CHEST - 2 VIEW  Comparison: 07/15/2012  Findings: Cardiomediastinal silhouette is stable. No pulmonary edema.  Mild left base retrocardiac atelectasis or infiltrate best seen on lateral view.  IMPRESSION: Left base retrocardiac mild atelectasis or infiltrate best is seen on lateral view.  No pulmonary edema.   Original Report Authenticated By: Lahoma Crocker, M.D.    Ct Abdomen Pelvis W Contrast  09/05/2012  *RADIOLOGY REPORT*  Clinical Data: Abdominal pain, emesis.  CT ABDOMEN AND PELVIS WITH CONTRAST  Technique:  Multidetector CT imaging of the abdomen and pelvis was performed following the standard protocol during bolus administration of intravenous contrast.  Contrast: 145mL OMNIPAQUE IOHEXOL 300 MG/ML  SOLN  Comparison: 07/15/2012   Findings: Cardiomegaly mild peripheral reticular opacities, in keeping with fibrosis.  Mild more confluent opacities within the left greater than right lung bases; atelectasis versus infiltrate. No pleural or pericardial effusion.  Hepatomegaly with nodular contour and heterogeneous attenuation, with numerous hypodense foci.  Unremarkable spleen, pancreas, adrenal glands.  Status post cholecystectomy.  No biliary ductal dilatation.  There are a couple hypodensities within the right kidney, too small further characterize.  Otherwise, symmetric renal enhancement.  No hydronephrosis or hydroureter.  No bowel obstruction.  Normal appendix.  Colonic diverticulosis. No overt CT evidence for diverticulitis.  No free intraperitoneal air or fluid.  No lymphadenopathy. Mild retro pancreatic fat stranding is similar to priors and may reflect sequelae of prior inflammation/pancreatitis.  Fibroid uterus.  No adnexal mass.  Multilevel degenerative changes of the imaged spine. No acute or aggressive appearing osseous lesion.  IMPRESSION: Mild left greater than right lung base opacities; atelectasis versus pneumonia.  Cirrhotic liver morphology.  Hepatocellular carcinoma cannot be excluded on this examination and liver MRI follow-up is recommended for surveillance imaging.  Colonic diverticulosis.  No CT evidence for diverticulitis.  Fibroid uterus.   Original Report Authenticated By: Carlos Levering, M.D.     Medications: I have reviewed the patient's current medications.  Assesment: 1. Pneumonia  2. Liver cirrhosis R/O hepatocellular carcinoma 4. H/O alcohol abuse 5. Hypertension Active Problems:  * No active hospital problems. *     Plan: - continue IV antibiotics -GI consult -repeat Cbc,BMP, and LFT -continue regular treatment.    LOS: 1 day   Savannah Jackson 09/06/2012, 7:35 AM

## 2012-09-06 NOTE — Progress Notes (Signed)
UR Chart Review Completed  

## 2012-09-06 NOTE — Consult Note (Signed)
Reason for consultation; Abnormal appearing liver on CT in a patient with a history of excessive alcohol use until recently. History of present illness; Patient is 56 year old African female who was admitted to Dr. Legrand Rams service via emergency room yesterday where she presented with acute onset of nausea vomiting and nonbloody diarrhea. While in emergency room she had abdominopelvic CT which reveals hepatomegaly echogenic liver with multiple tiny defects. She was also felt to have pulmonary infiltrate. She was admitted and begun on IV antibiotics. This afternoon she feels better. She had no difficulty with regular breakfast and lunch. She has not experienced nausea abdominal pain or diarrhea today. Patient is well known to me from recent evaluation back in September when she was admitted with profound hypokalemia anxiety and alcohol withdrawal and elevated transaminases. She was felt to have alcoholic hepatitis. Viral markers for hep B. and C. were negative. Patient reported she started to drink couple of years ago when her husband had amputation to one of his lower extremities and has been very mean and verbally abusive. She seemed very determined not to go back to drinking when she was discharged but 7 weeks ago. She says she has not had any alcohol since then. She has been under a lot of stress on account of her husband's illness who is presently at [facility] and she is having trouble there at least 2-3 times a week. She states she has lost 12 pounds in the last 2 months. She denies melena or rectal bleeding. She also denies cough or shortness of breath. Current medications; Current Facility-Administered Medications  Medication Dose Route Frequency Provider Last Rate Last Dose  . [COMPLETED] 0.9 %  sodium chloride infusion   Intravenous STAT Ezequiel Essex, MD 75 mL/hr at 09/05/12 1316    . acetaminophen (TYLENOL) tablet 650 mg  650 mg Oral Q6H PRN Rosita Fire, MD      .  [EXPIRED] albuterol (PROVENTIL) (5 MG/ML) 0.5% nebulizer solution 2.5 mg  2.5 mg Nebulization Q4H PRN Ezequiel Essex, MD      . azithromycin (ZITHROMAX) 500 mg in dextrose 5 % 250 mL IVPB  500 mg Intravenous Q24H Rosita Fire, MD   500 mg at 09/06/12 1441  . cefTRIAXone (ROCEPHIN) 1 g in dextrose 5 % 50 mL IVPB  1 g Intravenous Q24H Rosita Fire, MD   1 g at 09/06/12 1158  . enoxaparin (LOVENOX) injection 40 mg  40 mg Subcutaneous Q24H Lavonia Drafts [place], PHARMD   40 mg at 09/06/12 1835  . folic acid (FOLVITE) tablet 1 mg  1 mg Oral Daily Rosita Fire, MD   1 mg at 09/06/12 0915  . HYDROcodone-acetaminophen (NORCO/VICODIN) 5-325 MG per tablet 1 tablet  1 tablet Oral Q4H PRN Lanette Hampshire, MD   1 tablet at 09/06/12 0142  . [EXPIRED] HYDROmorphone (DILAUDID) injection 1 mg  1 mg Intravenous Q4H PRN Ezequiel Essex, MD      . lisinopril (PRINIVIL,ZESTRIL) tablet 20 mg  20 mg Oral Daily Rosita Fire, MD   20 mg at 09/06/12 0915  . loperamide (IMODIUM) capsule 4 mg  4 mg Oral Q8H PRN Rosita Fire, MD   4 mg at 09/06/12 1117  . metoprolol (LOPRESSOR) tablet 50 mg  50 mg Oral BID Rosita Fire, MD   50 mg at 09/06/12 0914  . [EXPIRED] ondansetron (ZOFRAN) injection 4 mg  4 mg Intravenous Q8H PRN Ezequiel Essex, MD      . [COMPLETED] sodium chloride 0.9 % injection  10 mL at 09/06/12 1158  . [COMPLETED] sodium chloride 0.9 % injection           . [COMPLETED] sodium chloride 0.9 % injection           . thiamine (VITAMIN B-1) tablet 100 mg  100 mg Oral Daily Rosita Fire, MD   100 mg at 09/06/12 0914   Home medications; Folate acid 1 mg by mouth daily. Lisinopril 20 mg by mouth daily. BC fast pain relief every 6 when necessary. Metoprolol tartrate 50 mg by mouth twice a day. Thiamine 100 mg by mouth daily. Visine a.c. eyedrops 2 drops to each eye 3 times a day as needed. Past medical history; Hypertension. Stress disorder(husband is sick and " mean"). Screening colonoscopy 5  years ago by Dr. Anthony Sar and no polyps found. Uterine fibroids noted on CT of September 2013. Recent hospitalization for alcoholic hepatitis. Cirrhosis suspected based on imaging study. Brain surgery for ruptured intracranial aneurysm in 1995 at [facility] with full recovery. History of breast carcinoma in 2002; she underwent lumpectomy followed by chemoradiation and has remained in remission. Cholecystectomy in 1999. Chronic low back pain secondary to degenerative disc disease and arthritis. Right knee replacement in 2003. Left shoulder surgery for rotator cuff tear in 2005. Allergies; She is not allergic to any medications but she is allergic to latex. Family history; Noncontributory. Social history; She is married. She has one daughter. Her husband is not doing well. He is presently hospitalized at [facility]. Patient has never smoked cigarettes. She drank alcohol in sessile amount over the last 9 months but quit 7 weeks ago. She is now disabled. She worked at [employer] for 8 years and then at [employer] for  12 years. Objective; BP 149/86  Pulse 67  Temp 98.1 F (36.7 C) (Oral)  Resp 18  Ht 5\' 3"  (1.6 m)  SpO2 92% Patient is alert and appears to be in no acute distress. Conjunctiva is pink. Sclera is nonicteric. No neck masses or thyromegaly noted. Cardiac exam with regular rhythm normal S1 and S2. No murmur or gallop noted. Lungs are clear to auscultation. Abdomen is full. Bowel sounds are normal. On palpation soft abdomen with hepatomegaly. Liver is soft to firm not hard with smooth surface. Inferior margin 8 cm below RCM. Spleen is not palpable. No LE edema or clubbing noted. Lab data; Current and prior CT reviewed along with Dr. Lavonia Dana. Bilateral lung opacities either due to atelectasis or pneumonia. Hepatomegaly with echogenic liver and multiple small hypodense foci 2 mm each. Chronic diverticulosis. Uterine fibroids. No evidence of ascites. CBC from 09/05/2012. WBC 6.1, H&H  15.9 and 47.0 and platelet count 145K. Bilirubin 3.3. It was not fractionated. AP 227, AST 88, ALT 22 total protein 7.5 with albumin of 2.9 Serum calcium 9.0. Glucose 91 BUN 5 and creatinine 0.55 Assessment; Patient is 56 year old African female was admitted to this facility yesterday with nausea vomiting diarrhea and felt to have pneumonia. Acute GI symptoms have resolved and would appear to be due to self-limiting bouts of gastroenteritis and no further workup needed. Liver abnormality on CT most likely secondary to alcoholic hepatitis and fatty liver. Since these changes are somewhat atypical would be reasonable to consider [place] in differential diagnosis. Infection unlikely given her clinical course and the fact these changes are also seen on CT of 07/15/2012.  I believe alcoholic hepatitis is improving as evidenced by dropping AST and rice in serum albumin. Her bilirubin is higher than it was in September.  It has not been fractionated yet. 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 Will complete her workup by doing an MRI of liver with contrast and would also check AFP level. Recommendations; AFP and INR in a.m. MRI of liver with contrast. If patient is discharged over the weekend this study would be performed on an outpatient basis.

## 2012-09-07 LAB — CBC
Hemoglobin: 14.5 g/dL (ref 12.0–15.0)
MCV: 93.4 fL (ref 78.0–100.0)
Platelets: 109 10*3/uL — ABNORMAL LOW (ref 150–400)
RBC: 4.7 MIL/uL (ref 3.87–5.11)
WBC: 6.9 10*3/uL (ref 4.0–10.5)

## 2012-09-07 LAB — BASIC METABOLIC PANEL
CO2: 27 mEq/L (ref 19–32)
Chloride: 109 mEq/L (ref 96–112)
Glucose, Bld: 89 mg/dL (ref 70–99)
Sodium: 141 mEq/L (ref 135–145)

## 2012-09-07 LAB — HEPATIC FUNCTION PANEL
ALT: 23 U/L (ref 0–35)
Albumin: 2.6 g/dL — ABNORMAL LOW (ref 3.5–5.2)
Alkaline Phosphatase: 198 U/L — ABNORMAL HIGH (ref 39–117)
Total Bilirubin: 2.1 mg/dL — ABNORMAL HIGH (ref 0.3–1.2)
Total Protein: 6.6 g/dL (ref 6.0–8.3)

## 2012-09-07 NOTE — Progress Notes (Signed)
Subjective: Patient feels better. Her abdominal pain, nausea and vomiting is improving. No fever chills. Talked to Dr. Laural Golden who evaluated her and recommended MRI of the abdomen.  Objective: Vital signs in last 24 hours: Temp:  [97.4 F (36.3 C)-99.1 F (37.3 C)] 97.4 F (36.3 C) (11/09 0507) Pulse Rate:  [67-72] 72  (11/08 2148) Resp:  [18] 18  (11/09 0507) BP: (130-149)/(80-86) 144/86 mmHg (11/09 0507) SpO2:  [92 %-93 %] 92 % (11/09 0507) Weight:  [69.582 kg (153 lb 6.4 oz)] 69.582 kg (153 lb 6.4 oz) (11/09 0100) Weight change:  Last BM Date: 09/06/12  Intake/Output from previous day: 11/08 0701 - 11/09 0700 In: 1700 [P.O.:1140; I.V.:10; IV Piggyback:550] Out: -   PHYSICAL EXAM General appearance: alert and no distress Resp: diminished breath sounds bibasilar Cardio: S1, S2 normal GI: RUQ tenderness, large hepatomegally Extremities: extremities normal, atraumatic, no cyanosis or edema  Lab Results:    @labtest @ [facility] No results found for this basename: PHART,PCO2,PO2ART,TCO2,HCO3 in the last 72 hours CULTURES No results found for this or any previous visit (from the past 240 hour(s)). Studies/Results: Dg Chest 2 View  09/05/2012  *RADIOLOGY REPORT*  Clinical Data: Cough  CHEST - 2 VIEW  Comparison: 07/15/2012  Findings: Cardiomediastinal silhouette is stable. No pulmonary edema.  Mild left base retrocardiac atelectasis or infiltrate best seen on lateral view.  IMPRESSION: Left base retrocardiac mild atelectasis or infiltrate best is seen on lateral view.  No pulmonary edema.   Original Report Authenticated By: Lahoma Crocker, M.D.     Medications: I have reviewed the patient's current medications.  Assesment: 1. Pneumonia  2. Liver cirrhosis R/O hepatocellular carcinoma 4. H/O alcohol abuse 5. Hypertension Active Problems:  * No active hospital problems. *     Plan: - continue IV antibiotics -GI consult appreciated MRI of abdomen as recomended -repeat Cbc,BMP,  and LFT -continue regular treatment.    LOS: 2 days   Lachelle Rissler 09/07/2012, 9:55 AM

## 2012-09-07 NOTE — Progress Notes (Signed)
Cdiff stool sample collected and taken lab. Lab stated they could not test stool sample due to stool formed.

## 2012-09-08 MED ORDER — SODIUM CHLORIDE 0.9 % IJ SOLN
INTRAMUSCULAR | Status: AC
  Administered 2012-09-08: 10 mL
  Filled 2012-09-08: qty 3

## 2012-09-08 MED ORDER — CLONIDINE HCL 0.2 MG PO TABS
0.2000 mg | ORAL_TABLET | Freq: Four times a day (QID) | ORAL | Status: DC | PRN
  Administered 2012-09-09: 0.2 mg via ORAL
  Filled 2012-09-08: qty 1

## 2012-09-08 NOTE — Progress Notes (Signed)
Subjective: Patient had diarrhea yesterday. It is better today. No abdominal pain,nauseas or vomiting. No fever or chills.  Objective: Vital signs in last 24 hours: Temp:  [97.4 F (36.3 C)-99 F (37.2 C)] 99 F (37.2 C) (11/10 0526) Pulse Rate:  [65-114] 114  (11/10 0526) Resp:  [16-20] 20  (11/10 0526) BP: (160-169)/(90-97) 169/97 mmHg (11/10 0526) SpO2:  [90 %-94 %] 94 % (11/10 0526) Weight change:  Last BM Date: 09/07/12  Intake/Output from previous day: 11/09 0701 - 11/10 0700 In: 1620 [P.O.:1320; IV Piggyback:300] Out: -   PHYSICAL EXAM General appearance: alert and no distress Resp: diminished breath sounds bibasilar Cardio: S1, S2 normal GI: RUQ tenderness, large hepatomegally Extremities: extremities normal, atraumatic, no cyanosis or edema  Lab Results:    @labtest @ [facility] No results found for this basename: PHART,PCO2,PO2ART,TCO2,HCO3 in the last 72 hours CULTURES No results found for this or any previous visit (from the past 240 hour(s)). Studies/Results: No results found.  Medications: I have reviewed the patient's current medications.  Assesment: 1. Pneumonia  2. Liver cirrhosis R/O hepatocellular carcinoma 4. H/O alcohol abuse 5. Hypertension Active Problems:  * No active hospital problems. *     Plan: - continue IV antibiotics MRI of abdomen as recomended -repeat Cbc,BMP, and LFT -continue regular treatment.    LOS: 3 days   Savannah Jackson 09/08/2012, 9:03 AM

## 2012-09-09 ENCOUNTER — Ambulatory Visit (HOSPITAL_COMMUNITY): Payer: Medicare HMO

## 2012-09-09 MED ORDER — AMOXICILLIN-POT CLAVULANATE 500-125 MG PO TABS: 1.0000 | ORAL_TABLET | Freq: Three times a day (TID) | ORAL | Status: DC

## 2012-09-09 NOTE — Progress Notes (Signed)
Subjective: Patient feels better. She is taking iv antibiotics. She is scheduled for MRI of the liver today..   Objective: Vital signs in last 24 hours: Temp:  [97.7 F (36.5 C)-97.9 F (36.6 C)] 97.7 F (36.5 C) (11/11 0540) Pulse Rate:  [53-65] 53  (11/11 0540) Resp:  [20] 20  (11/11 0540) BP: (154-204)/(83-115) 154/83 mmHg (11/11 0540) SpO2:  [94 %-96 %] 96 % (11/10 2201) Weight change:  Last BM Date: 09/08/12  Intake/Output from previous day: 11/10 0701 - 11/11 0700 In: 1270 [P.O.:960; I.V.:10; IV Piggyback:300] Out: -   PHYSICAL EXAM General appearance: alert and no distress Resp: diminished breath sounds bibasilar Cardio: S1, S2 normal GI: RUQ tenderness, large hepatomegally Extremities: extremities normal, atraumatic, no cyanosis or edema  Lab Results:    @labtest @ [facility] No results found for this basename: PHART,PCO2,PO2ART,TCO2,HCO3 in the last 72 hours CULTURES No results found for this or any previous visit (from the past 240 hour(s)). Studies/Results: No results found.  Medications: I have reviewed the patient's current medications.  Assesment: 1. Pneumonia  2. Liver cirrhosis R/O hepatocellular carcinoma 4. H/O alcohol abuse 5. Hypertension Active Problems:  * No active hospital problems. *     Plan: - continue IV antibiotics MRI of the liver to day -continue regular treatment.    LOS: 4 days   Isiaih Hollenbach 09/09/2012, 8:01 AM

## 2012-09-09 NOTE — Discharge Summary (Signed)
Physician Discharge Summary  Patient ID: Savannah Jackson MRN: [id number] DOB/AGE: May 03, 1956 56 y.o. Primary Care Physician:Ezra Marquess, MD Admit date: 09/05/2012 Discharge date: 09/09/2012    Discharge Diagnoses:  Pneumonia  2. Liver cirrhosis R/O hepatocellular carcinoma  4. H/O alcohol abuse  5. Hypertension  Active Problems:  * No active hospital problems. *      Medication List     As of 09/09/2012  3:05 PM    TAKE these medications         amoxicillin-clavulanate 500-125 MG per tablet   Commonly known as: AUGMENTIN   Take 1 tablet (500 mg total) by mouth 3 (three) times daily.      BC FAST PAIN RELIEF 845-65 MG Pack   Generic drug: Aspirin-Caffeine   Take 1 packet by mouth every 6 (six) hours as needed. Pain      folic acid 1 MG tablet   Commonly known as: FOLVITE   Take 1 tablet (1 mg total) by mouth daily.      lisinopril 20 MG tablet   Commonly known as: PRINIVIL,ZESTRIL   Take 1 tablet (20 mg total) by mouth daily.      methocarbamol 500 MG tablet   Commonly known as: ROBAXIN   Take 500 mg by mouth daily as needed. Muscle Spasms      metoprolol 50 MG tablet   Commonly known as: LOPRESSOR   Take 50 mg by mouth 2 (two) times daily.      tetrahydrozoline-zinc 0.05-0.25 % ophthalmic solution   Commonly known as: VISINE-AC   Place 2 drops into both eyes 3 (three) times daily as needed. Dry Eyes      thiamine 100 MG tablet   Take 1 tablet (100 mg total) by mouth daily.        Discharged Condition:*stable   Consults:*GI Significant Diagnostic Studies: Dg Chest 2 View  09/05/2012  *RADIOLOGY REPORT*  Clinical Data: Cough  CHEST - 2 VIEW  Comparison: 07/15/2012  Findings: Cardiomediastinal silhouette is stable. No pulmonary edema.  Mild left base retrocardiac atelectasis or infiltrate best seen on lateral view.  IMPRESSION: Left base retrocardiac mild atelectasis or infiltrate best is seen on lateral view.  No pulmonary edema.   Original Report  Authenticated By: Lahoma Crocker, M.D.    Ct Abdomen Pelvis W Contrast  09/05/2012  *RADIOLOGY REPORT*  Clinical Data: Abdominal pain, emesis.  CT ABDOMEN AND PELVIS WITH CONTRAST  Technique:  Multidetector CT imaging of the abdomen and pelvis was performed following the standard protocol during bolus administration of intravenous contrast.  Contrast: 140mL OMNIPAQUE IOHEXOL 300 MG/ML  SOLN  Comparison: 07/15/2012  Findings: Cardiomegaly mild peripheral reticular opacities, in keeping with fibrosis.  Mild more confluent opacities within the left greater than right lung bases; atelectasis versus infiltrate. No pleural or pericardial effusion.  Hepatomegaly with nodular contour and heterogeneous attenuation, with numerous hypodense foci.  Unremarkable spleen, pancreas, adrenal glands.  Status post cholecystectomy.  No biliary ductal dilatation.  There are a couple hypodensities within the right kidney, too small further characterize.  Otherwise, symmetric renal enhancement.  No hydronephrosis or hydroureter.  No bowel obstruction.  Normal appendix.  Colonic diverticulosis. No overt CT evidence for diverticulitis.  No free intraperitoneal air or fluid.  No lymphadenopathy. Mild retro pancreatic fat stranding is similar to priors and may reflect sequelae of prior inflammation/pancreatitis.  Fibroid uterus.  No adnexal mass.  Multilevel degenerative changes of the imaged spine. No acute or aggressive appearing osseous lesion.  IMPRESSION: Mild  left greater than right lung base opacities; atelectasis versus pneumonia.  Cirrhotic liver morphology.  Hepatocellular carcinoma cannot be excluded on this examination and liver MRI follow-up is recommended for surveillance imaging.  Colonic diverticulosis.  No CT evidence for diverticulitis.  Fibroid uterus.   Original Report Authenticated By: Carlos Levering, M.D.     Lab Results: Basic Metabolic Panel:  Basename 09/07/12 0700  NA 141  K 3.9  CL 109  CO2 27  GLUCOSE 89   BUN 6  CREATININE 0.69  CALCIUM 8.4  MG --  PHOS --   Liver Function Tests:  Basename 09/07/12 0700  AST 84*  ALT 23  ALKPHOS 198*  BILITOT 2.1*  PROT 6.6  ALBUMIN 2.6*     CBC:  Basename 09/07/12 0700  WBC 6.9  NEUTROABS --  HGB 14.5  HCT 43.9  MCV 93.4  PLT 109*    No results found for this or any previous visit (from the past 240 hour(s)).   Hospital Course: * This is a 56 years old female patient with history of multiple medical illnesses including alcoholic liver diseases was admitted due to abdominal pain, nausea and vomiting. Ct scan of the abdomen showed liver cirrhosis with suspicion for hepatocellular carcinoma and opacity of the lung. She was admitted and was treated with IV antibiotics for pneumonia and  GI consult was done. MRI of the liver was recommended, however the test was cancelled because patient has clip on her brain. Overall she improved and discharged to be followed in out patient. Discharge Exam: Blood pressure 150/92, pulse 60, temperature 97.9 F (36.6 C), temperature source Oral, resp. rate 20, height 5\' 3"  (1.6 m), weight 69.582 kg (153 lb 6.4 oz), SpO2 95.00%. * Disposition: *home       Follow-up Information    Follow up with REHMAN,NAJEEB U, MD. In 2 weeks.   Contact information:   [address], [address] [place] [address] [phone]       Follow up with [facility], MD. In 3 weeks.   Contact information:   [place] [address] [phone]          Signed: Kamaria Lucia   09/09/2012, 3:05 PM

## 2012-09-09 NOTE — Progress Notes (Signed)
Discharge instructions given to pt. With teach back given to RN. Pt. Taken to car via W/C. 

## 2012-09-10 NOTE — Progress Notes (Signed)
UR chart review completed.  

## 2012-12-18 ENCOUNTER — Ambulatory Visit: Payer: Medicare HMO | Admitting: Gastroenterology

## 2013-02-17 ENCOUNTER — Encounter (HOSPITAL_COMMUNITY): Payer: Self-pay | Admitting: Radiology

## 2013-02-17 ENCOUNTER — Inpatient Hospital Stay (HOSPITAL_COMMUNITY)
Admission: EM | Admit: 2013-02-17 | Discharge: 2013-02-19 | DRG: 392 | Disposition: A | Discharge status: Home / Self Care | Payer: Medicare HMO | Attending: Internal Medicine | Admitting: Internal Medicine

## 2013-02-17 ENCOUNTER — Emergency Department (HOSPITAL_COMMUNITY): Payer: Medicare HMO

## 2013-02-17 DIAGNOSIS — K7030 Alcoholic cirrhosis of liver without ascites: Secondary | ICD-10-CM | POA: Diagnosis present

## 2013-02-17 DIAGNOSIS — I10 Essential (primary) hypertension: Secondary | ICD-10-CM | POA: Diagnosis present

## 2013-02-17 DIAGNOSIS — G8929 Other chronic pain: Secondary | ICD-10-CM | POA: Diagnosis present

## 2013-02-17 DIAGNOSIS — Z96659 Presence of unspecified artificial knee joint: Secondary | ICD-10-CM

## 2013-02-17 DIAGNOSIS — K5792 Diverticulitis of intestine, part unspecified, without perforation or abscess without bleeding: Secondary | ICD-10-CM

## 2013-02-17 DIAGNOSIS — F1020 Alcohol dependence, uncomplicated: Secondary | ICD-10-CM | POA: Diagnosis present

## 2013-02-17 DIAGNOSIS — R1032 Left lower quadrant pain: Secondary | ICD-10-CM | POA: Diagnosis present

## 2013-02-17 DIAGNOSIS — M5137 Other intervertebral disc degeneration, lumbosacral region: Secondary | ICD-10-CM | POA: Diagnosis present

## 2013-02-17 DIAGNOSIS — M549 Dorsalgia, unspecified: Secondary | ICD-10-CM | POA: Diagnosis present

## 2013-02-17 DIAGNOSIS — M51379 Other intervertebral disc degeneration, lumbosacral region without mention of lumbar back pain or lower extremity pain: Secondary | ICD-10-CM | POA: Diagnosis present

## 2013-02-17 DIAGNOSIS — Z79899 Other long term (current) drug therapy: Secondary | ICD-10-CM

## 2013-02-17 DIAGNOSIS — K5732 Diverticulitis of large intestine without perforation or abscess without bleeding: Principal | ICD-10-CM | POA: Diagnosis present

## 2013-02-17 LAB — URINALYSIS, ROUTINE W REFLEX MICROSCOPIC
Nitrite: NEGATIVE
Specific Gravity, Urine: 1.02 (ref 1.005–1.030)
Urobilinogen, UA: 1 mg/dL (ref 0.0–1.0)
pH: 6.5 (ref 5.0–8.0)

## 2013-02-17 LAB — COMPREHENSIVE METABOLIC PANEL
ALT: 22 U/L (ref 0–35)
AST: 77 U/L — ABNORMAL HIGH (ref 0–37)
Calcium: 8.3 mg/dL — ABNORMAL LOW (ref 8.4–10.5)
Creatinine, Ser: 0.5 mg/dL (ref 0.50–1.10)
GFR calc Af Amer: >90 mL/min (ref >90)
Sodium: 138 mEq/L (ref 135–145)
Total Protein: 6.5 g/dL (ref 6.0–8.3)

## 2013-02-17 LAB — URINE MICROSCOPIC-ADD ON

## 2013-02-17 LAB — CBC WITH DIFFERENTIAL/PLATELET
Basophils Absolute: 0.1 10*3/uL (ref 0.0–0.1)
Eosinophils Absolute: 0 10*3/uL (ref 0.0–0.7)
Eosinophils Relative: 1 % (ref 0–5)
HCT: 42.9 % (ref 36.0–46.0)
MCH: 31.1 pg (ref 26.0–34.0)
MCHC: 35.2 g/dL (ref 30.0–36.0)
MCV: 88.5 fL (ref 78.0–100.0)
Monocytes Absolute: 1 10*3/uL (ref 0.1–1.0)
Platelets: 107 10*3/uL — ABNORMAL LOW (ref 150–400)
RDW: 14.3 % (ref 11.5–15.5)

## 2013-02-17 LAB — LACTIC ACID, PLASMA: Lactic Acid, Venous: 1.5 mmol/L (ref 0.5–2.2)

## 2013-02-17 LAB — LIPASE, BLOOD: Lipase: 26 U/L (ref 11–59)

## 2013-02-17 MED ORDER — ONDANSETRON HCL 4 MG/2ML IJ SOLN: 4.0000 mg | Freq: Four times a day (QID) | INTRAMUSCULAR | Status: DC | PRN

## 2013-02-17 MED ORDER — LISINOPRIL 20 MG PO TABS
20.0000 mg | ORAL_TABLET | Freq: Every day | ORAL | Status: DC
  Administered 2013-02-17 – 2013-02-19 (×3): 20 mg via ORAL
  Filled 2013-02-17 (×3): qty 1

## 2013-02-17 MED ORDER — METRONIDAZOLE IN NACL 5-0.79 MG/ML-% IV SOLN
500.0000 mg | Freq: Once | INTRAVENOUS | Status: AC
  Administered 2013-02-17: 500 mg via INTRAVENOUS
  Filled 2013-02-17: qty 100

## 2013-02-17 MED ORDER — ONDANSETRON HCL 4 MG/2ML IJ SOLN
4.0000 mg | Freq: Once | INTRAMUSCULAR | Status: AC
  Administered 2013-02-17: 4 mg via INTRAVENOUS
  Filled 2013-02-17: qty 2

## 2013-02-17 MED ORDER — METOPROLOL TARTRATE 50 MG PO TABS
50.0000 mg | ORAL_TABLET | Freq: Two times a day (BID) | ORAL | Status: DC
  Administered 2013-02-17 – 2013-02-19 (×4): 50 mg via ORAL
  Filled 2013-02-17 (×5): qty 1

## 2013-02-17 MED ORDER — IOHEXOL 300 MG/ML  SOLN
100.0000 mL | Freq: Once | INTRAMUSCULAR | Status: AC | PRN
  Administered 2013-02-17: 100 mL via INTRAVENOUS

## 2013-02-17 MED ORDER — SODIUM CHLORIDE 0.9 % IV SOLN
INTRAVENOUS | Status: DC
  Administered 2013-02-17 (×2): 1000 mL via INTRAVENOUS

## 2013-02-17 MED ORDER — METRONIDAZOLE IN NACL 5-0.79 MG/ML-% IV SOLN
500.0000 mg | Freq: Three times a day (TID) | INTRAVENOUS | Status: DC
  Administered 2013-02-18 – 2013-02-19 (×4): 500 mg via INTRAVENOUS
  Filled 2013-02-17 (×7): qty 100

## 2013-02-17 MED ORDER — IOHEXOL 300 MG/ML  SOLN
50.0000 mL | Freq: Once | INTRAMUSCULAR | Status: AC | PRN
  Administered 2013-02-17: 50 mL via ORAL

## 2013-02-17 MED ORDER — ENOXAPARIN SODIUM 40 MG/0.4ML ~~LOC~~ SOLN
40.0000 mg | SUBCUTANEOUS | Status: DC
  Administered 2013-02-17: 40 mg via SUBCUTANEOUS
  Filled 2013-02-17 (×2): qty 0.4

## 2013-02-17 MED ORDER — HYDROMORPHONE HCL PF 1 MG/ML IJ SOLN
1.0000 mg | INTRAMUSCULAR | Status: DC | PRN
Start: 2013-02-17 — End: 2013-02-18

## 2013-02-17 MED ORDER — CIPROFLOXACIN IN D5W 400 MG/200ML IV SOLN
400.0000 mg | Freq: Two times a day (BID) | INTRAVENOUS | Status: DC
  Administered 2013-02-18 – 2013-02-19 (×3): 400 mg via INTRAVENOUS
  Filled 2013-02-17 (×4): qty 200

## 2013-02-17 MED ORDER — CIPROFLOXACIN IN D5W 400 MG/200ML IV SOLN
400.0000 mg | Freq: Once | INTRAVENOUS | Status: AC
  Administered 2013-02-17: 400 mg via INTRAVENOUS
  Filled 2013-02-17: qty 200

## 2013-02-17 MED ORDER — MORPHINE SULFATE 4 MG/ML IJ SOLN
4.0000 mg | Freq: Once | INTRAMUSCULAR | Status: AC
  Administered 2013-02-17: 4 mg via INTRAVENOUS
  Filled 2013-02-17: qty 1

## 2013-02-17 MED ORDER — ONDANSETRON HCL 4 MG PO TABS: 4.0000 mg | ORAL_TABLET | Freq: Four times a day (QID) | ORAL | Status: DC | PRN

## 2013-02-17 MED ORDER — SODIUM CHLORIDE 0.9 % IV SOLN
INTRAVENOUS | Status: AC
  Administered 2013-02-17: 21:00:00 via INTRAVENOUS

## 2013-02-17 NOTE — ED Notes (Signed)
Patient transported to CT 

## 2013-02-17 NOTE — H&P (Signed)
PCP:   Savannah Jackson,TESFAYE, MD   Chief Complaint:  abd pain  HPI: 57 yo female with h/o etoh cirrhosis, htn comes in with several days of lower abd pain with n/v.  Nonbloody, no diarrhea.  No fevers but having chills.  No cp or sob.  No le edema or swelling.  No etoh intake for about 2 months.  Ct shows acute diverticultiis.  Feels better with tx in ED already.  Review of Systems:  Positive and negative as per HPI otherwise all other systems are negative  Past Medical History: Past Medical History  Diagnosis Date  . Hypertension   . Brain aneurysm   . Cancer   . Rotator cuff syndrome of left shoulder   . DDD (degenerative disc disease), lumbar   . Chronic back pain Diverticultis   Past Surgical History  Procedure Laterality Date  . Cholecystectomy    . Brain surgery    . Breast surgery    . Total knee arthroplasty      right    Medications: Prior to Admission medications   Medication Sig Start Date End Date Taking? Authorizing Provider  lisinopril (PRINIVIL,ZESTRIL) 20 MG tablet Take 1 tablet (20 mg total) by mouth daily. 07/18/12  Yes Rosita Fire, MD  methocarbamol (ROBAXIN) 500 MG tablet Take 500 mg by mouth daily as needed. Muscle Spasms   Yes Historical Provider, MD  metoprolol (LOPRESSOR) 50 MG tablet Take 50 mg by mouth 2 (two) times daily.   Yes Historical Provider, MD  tetrahydrozoline-zinc (VISINE-AC) 0.05-0.25 % ophthalmic solution Place 2 drops into both eyes 3 (three) times daily as needed. Dry Eyes   Yes Historical Provider, MD  VITAMIN D, CHOLECALCIFEROL, PO Take 1 tablet by mouth daily.   Yes Historical Provider, MD    Allergies:   Allergies  Allergen Reactions  . Latex Swelling    Discoloration of skin.   . Vicodin (Hydrocodone-Acetaminophen) Itching    Social History:  reports that she has never smoked. She does not have any smokeless tobacco history on file. She reports that she does not drink alcohol or use illicit drugs.  Family  History: neg  Physical Exam: Filed Vitals:   02/17/13 1744 02/17/13 1749 02/17/13 1939 02/17/13 2000  BP: 161/95 161/95 163/102 155/95  Pulse: 73 74 81 74  Temp: 97.9 F (36.6 C)     TempSrc: Oral     Resp: 22     Height: 5\' 3"  (1.6 m)     Weight: 73.936 kg (163 lb)     SpO2: 97% 97% 93% 94%   General appearance: alert, cooperative and no distress Head: Normocephalic, without obvious abnormality, atraumatic Eyes: negative Nose: Nares normal. Septum midline. Mucosa normal. No drainage or sinus tenderness. Neck: no JVD and supple, symmetrical, trachea midline Lungs: clear to auscultation bilaterally Heart: regular rate and rhythm, S1, S2 normal, no murmur, click, rub or gallop Abdomen: soft, non-tender; bowel sounds normal; no masses,  no organomegaly Extremities: extremities normal, atraumatic, no cyanosis or edema Pulses: 2+ and symmetric Skin: Skin color, texture, turgor normal. No rashes or lesions Neurologic: Grossly normal    Labs on Admission:   Recent Labs  02/17/13 1756  NA 138  K 3.1*  CL 105  CO2 24  GLUCOSE 93  BUN 6  CREATININE 0.50  CALCIUM 8.3*    Recent Labs  02/17/13 1756  AST 77*  ALT 22  ALKPHOS 192*  BILITOT 1.7*  PROT 6.5  ALBUMIN 2.1*    Recent Labs  02/17/13 1756  LIPASE 26    Recent Labs  02/17/13 1756  WBC 6.2  NEUTROABS 3.7  HGB 15.1*  HCT 42.9  MCV 88.5  PLT 107*    Radiological Exams on Admission: Ct Abdomen Pelvis W Contrast  02/17/2013  *RADIOLOGY REPORT*  Clinical Data: Left lower quadrant abdominal pain and left flank pain.  Nausea.  Cholecystectomy.  CT ABDOMEN AND PELVIS WITH CONTRAST  Technique:  Multidetector CT imaging of the abdomen and pelvis was performed following the standard protocol during bolus administration of intravenous contrast.  Contrast: 151mL OMNIPAQUE IOHEXOL 300 MG/ML  SOLN  Comparison: 09/05/2012  Findings: Lung bases:  Clear lung bases.  Mild cardiomegaly.  Trace right pleural fluid.   Abdomen/pelvis:  Moderate cirrhosis, with innumerable tiny lesions within, likely a regenerative nodules.  Grossly similar.  No dominant liver mass.  Portal vein patent.  Normal spleen, stomach, pancreas. Cholecystectomy without biliary ductal dilatation.  Normal adrenal glands and kidneys. No retroperitoneal or retrocrural adenopathy.  Extensive colonic diverticulosis.  Edema surrounding the ascending colon on image 49/series 2. Normal terminal ileum and appendix. Normal caliber of small bowel loops.  Development of small volume abdominal ascites.  No pelvic adenopathy.  Normal urinary bladder.  Uterine fibroids which are grossly similar configuration. No adnexal mass or significant free fluid.  Bones/Musculoskeletal:  No acute osseous abnormality.  IMPRESSION:  1.  Edema surrounding the ascending colon.  Favor uncomplicated diverticulitis.  Infectious colitis could look similar. 2.  Moderate cirrhosis with development of small volume abdominal ascites. 3.  New trace right pleural effusion. 4.  Uterine fibroids.   Original Report Authenticated By: Abigail Miyamoto, M.D.     Assessment/Plan 57 yo female with acute diverticulitis uncomplicated  Principal Problem:   Diverticulitis of colon without hemorrhage Active Problems:   Hypertension   Cirrhosis, alcoholic   Abdominal pain, left lower quadrant  Place on iv cipro and flagyl.  Full diet.  Adv as tolerated.  Gentle ivf.  Iv pain meds and antiemetics prn.  abd exam is benign.  Cirrhosis currently compensated.  Admit to med bed.  Council Munguia A 02/17/2013, 9:21 PM

## 2013-02-17 NOTE — ED Notes (Signed)
Daughter Savannah Jackson [phone]

## 2013-02-17 NOTE — ED Provider Notes (Signed)
History     CSN: [id number]  Arrival date & time 02/17/13  1736   First MD Initiated Contact with Patient 02/17/13 1740      Chief Complaint  Patient presents with  . Abdominal Pain    (Consider location/radiation/quality/duration/timing/severity/associated sxs/prior treatment) HPI Comments: Patient comes to the ER for evaluation of abdominal pain. Symptoms began at around 7 AM this morning. She reports that she had an episode of nausea and vomiting at that time. She took a nausea pill and has not had any further vomiting. Patient reports that the pain is all over the abdomen, but is mostly in the left lower abdomen around into her back. She has not had any urinary symptoms. Patient says she was hot and cold earlier, but has not taken her temperature. Pain is moderate to severe. Nothing seems to make it better or worse.  Patient is a 57 y.o. female presenting with abdominal pain.  Abdominal Pain Associated symptoms: chills, nausea and vomiting     Past Medical History  Diagnosis Date  . Hypertension   . Brain aneurysm   . Cancer   . Rotator cuff syndrome of left shoulder   . DDD (degenerative disc disease), lumbar   . Chronic back pain Diverticultis    Past Surgical History  Procedure Laterality Date  . Cholecystectomy    . Brain surgery    . Breast surgery    . Total knee arthroplasty      right    No family history on file.  History  Substance Use Topics  . Smoking status: Never Smoker   . Smokeless tobacco: Not on file  . Alcohol Use: No     Comment: stopped 07/10/12, was drinking daily     OB History   Grav Para Term Preterm Abortions TAB SAB Ect Mult Living                  Review of Systems  Constitutional: Positive for chills.  Gastrointestinal: Positive for nausea, vomiting and abdominal pain.  All other systems reviewed and are negative.    Allergies  Latex and Vicodin  Home Medications   Current Outpatient Rx  Name  Route  Sig  Dispense   Refill  . amoxicillin-clavulanate (AUGMENTIN) 500-125 MG per tablet   Oral   Take 1 tablet (500 mg total) by mouth 3 (three) times daily.   15 tablet   0   . Aspirin-Caffeine (BC FAST PAIN RELIEF) 845-65 MG PACK   Oral   Take 1 packet by mouth every 6 (six) hours as needed. Pain         . folic acid (FOLVITE) 1 MG tablet   Oral   Take 1 tablet (1 mg total) by mouth daily.   30 tablet   3   . lisinopril (PRINIVIL,ZESTRIL) 20 MG tablet   Oral   Take 1 tablet (20 mg total) by mouth daily.   30 tablet   3   . methocarbamol (ROBAXIN) 500 MG tablet   Oral   Take 500 mg by mouth daily as needed. Muscle Spasms         . metoprolol (LOPRESSOR) 50 MG tablet   Oral   Take 50 mg by mouth 2 (two) times daily.         Marland Kitchen tetrahydrozoline-zinc (VISINE-AC) 0.05-0.25 % ophthalmic solution   Both Eyes   Place 2 drops into both eyes 3 (three) times daily as needed. Dry Eyes         .  thiamine 100 MG tablet   Oral   Take 1 tablet (100 mg total) by mouth daily.   30 tablet   3     SpO2 100%  Physical Exam  Constitutional: She is oriented to person, place, and time. She appears well-developed and well-nourished. No distress.  HENT:  Head: Normocephalic and atraumatic.  Right Ear: Hearing normal.  Nose: Nose normal.  Mouth/Throat: Oropharynx is clear and moist and mucous membranes are normal.  Eyes: Conjunctivae and EOM are normal. Pupils are equal, round, and reactive to light.  Neck: Normal range of motion. Neck supple.  Cardiovascular: Normal rate, regular rhythm, S1 normal and S2 normal.  Exam reveals no gallop and no friction rub.   No murmur heard. Pulmonary/Chest: Effort normal and breath sounds normal. No respiratory distress. She exhibits no tenderness.  Abdominal: Soft. Normal appearance and bowel sounds are normal. There is no hepatosplenomegaly. There is generalized tenderness. There is no rebound, no guarding, no tenderness at McBurney's point and negative  Murphy's sign. No hernia.  Musculoskeletal: Normal range of motion.  Neurological: She is alert and oriented to person, place, and time. She has normal strength. No cranial nerve deficit or sensory deficit. Coordination normal. GCS eye subscore is 4. GCS verbal subscore is 5. GCS motor subscore is 6.  Skin: Skin is warm, dry and intact. No rash noted. No cyanosis.  Psychiatric: She has a normal mood and affect. Her speech is normal and behavior is normal. Thought content normal.    ED Course  Procedures (including critical care time)  Labs Reviewed  CBC WITH DIFFERENTIAL - Abnormal; Notable for the following:    Hemoglobin 15.1 (*)    Platelets 107 (*)    Monocytes Relative 16 (*)    All other components within normal limits  COMPREHENSIVE METABOLIC PANEL - Abnormal; Notable for the following:    Potassium 3.1 (*)    Calcium 8.3 (*)    Albumin 2.1 (*)    AST 77 (*)    Alkaline Phosphatase 192 (*)    Total Bilirubin 1.7 (*)    All other components within normal limits  LIPASE, BLOOD  LACTIC ACID, PLASMA  URINALYSIS, ROUTINE W REFLEX MICROSCOPIC   Ct Abdomen Pelvis W Contrast  02/17/2013  *RADIOLOGY REPORT*  Clinical Data: Left lower quadrant abdominal pain and left flank pain.  Nausea.  Cholecystectomy.  CT ABDOMEN AND PELVIS WITH CONTRAST  Technique:  Multidetector CT imaging of the abdomen and pelvis was performed following the standard protocol during bolus administration of intravenous contrast.  Contrast: 162mL OMNIPAQUE IOHEXOL 300 MG/ML  SOLN  Comparison: 09/05/2012  Findings: Lung bases:  Clear lung bases.  Mild cardiomegaly.  Trace right pleural fluid.  Abdomen/pelvis:  Moderate cirrhosis, with innumerable tiny lesions within, likely a regenerative nodules.  Grossly similar.  No dominant liver mass.  Portal vein patent.  Normal spleen, stomach, pancreas. Cholecystectomy without biliary ductal dilatation.  Normal adrenal glands and kidneys. No retroperitoneal or retrocrural  adenopathy.  Extensive colonic diverticulosis.  Edema surrounding the ascending colon on image 49/series 2. Normal terminal ileum and appendix. Normal caliber of small bowel loops.  Development of small volume abdominal ascites.  No pelvic adenopathy.  Normal urinary bladder.  Uterine fibroids which are grossly similar configuration. No adnexal mass or significant free fluid.  Bones/Musculoskeletal:  No acute osseous abnormality.  IMPRESSION:  1.  Edema surrounding the ascending colon.  Favor uncomplicated diverticulitis.  Infectious colitis could look similar. 2.  Moderate cirrhosis with development  of small volume abdominal ascites. 3.  New trace right pleural effusion. 4.  Uterine fibroids.   Original Report Authenticated By: Abigail Miyamoto, M.D.      Diagnosis: Acute diverticulitis    MDM  Patient presents to ER with acute onset of abdominal pain this morning, worsening the day. She does report a previous history of diverticulitis. Pain was right-sided and left-sided. She has diffuse tenderness, but no signs of peritonitis. Workup does reveal right-sided diverticulitis. Because of the patient's comorbidities, I do not feel that she is a candidate for outpatient therapy of this uncomplicated diverticulitis. Patient initiated on Cipro and Flagyl, will be admitted.        Orpah Greek, MD 02/17/13 2030

## 2013-02-17 NOTE — Progress Notes (Signed)
ANTIBIOTIC CONSULT NOTE - INITIAL  Pharmacy Consult for Cipro Indication:  Uncomplicated Diverticulitis  Allergies  Allergen Reactions  . Latex Swelling    Discoloration of skin.   . Vicodin (Hydrocodone-Acetaminophen) Itching   Patient Measurements: Height: 5\' 3"  (160 cm) Weight: 163 lb (73.936 kg) IBW/kg (Calculated) : 52.4  Vital Signs: Temp: 97.9 F (36.6 C) (04/21 1744) Temp src: Oral (04/21 1744) BP: 155/95 mmHg (04/21 2000) Pulse Rate: 74 (04/21 2000)  Labs:  Recent Labs  02/17/13 1756  WBC 6.2  HGB 15.1*  PLT 107*  CREATININE 0.50   Estimated Creatinine Clearance: 74.7 ml/min (by C-G formula based on Cr of 0.5).  Microbiology: No results found for this or any previous visit (from the past 720 hour(s)).  Medical History: Past Medical History  Diagnosis Date  . Hypertension   . Brain aneurysm   . Cancer   . Rotator cuff syndrome of left shoulder   . DDD (degenerative disc disease), lumbar   . Chronic back pain Diverticultis   Medications:  Anti-infectives   Start     Dose/Rate Route Frequency Ordered Stop   02/17/13 2200  metroNIDAZOLE (FLAGYL) IVPB 500 mg     500 mg 100 mL/hr over 60 Minutes Intravenous Every 8 hours 02/17/13 2118     02/17/13 2015  ciprofloxacin (CIPRO) IVPB 400 mg     400 mg 200 mL/hr over 60 Minutes Intravenous  Once 02/17/13 2009 02/17/13 2123   02/17/13 2015  metroNIDAZOLE (FLAGYL) IVPB 500 mg     500 mg 100 mL/hr over 60 Minutes Intravenous  Once 02/17/13 2009       Assessment: This is a 57 yo female admitted with complaints of flank pain.  CT imaging done and patient with edema and what is thought to be uncomplicated diverticulitis.  She is being placed on IV Cipro and Metronidazole.  She received Cipro 400 mg IV around 8 PM.  Her creatinine is normal and she has an estimated clearance of 75 ml/min.  Goal of Therapy:  Therapeutic response to IV antibiotics  Plan:  1.  Cipro 400 mg IV every 12 hours. 2.  Monitor renal  function  3.  Consider changing to PO therapy in the next 24-48 hours.  Rober Minion, PharmD., MS Clinical Pharmacist Pager:  [phone] Thank you for allowing pharmacy to be part of this patients care team. 02/17/2013,9:43 PM

## 2013-02-17 NOTE — ED Notes (Signed)
Patient arrived via [facility] from home with N/V and abdominal pain. Abdominal pain is right upper that travels across to left and to back.

## 2013-02-18 ENCOUNTER — Encounter (HOSPITAL_COMMUNITY): Payer: Self-pay | Admitting: *Deleted

## 2013-02-18 LAB — COMPREHENSIVE METABOLIC PANEL
ALT: 19 U/L (ref 0–35)
Alkaline Phosphatase: 158 U/L — ABNORMAL HIGH (ref 39–117)
CO2: 26 mEq/L (ref 19–32)
Calcium: 7.7 mg/dL — ABNORMAL LOW (ref 8.4–10.5)
GFR calc Af Amer: >90 mL/min (ref >90)
GFR calc non Af Amer: >90 mL/min (ref >90)
Glucose, Bld: 87 mg/dL (ref 70–99)
Sodium: 142 mEq/L (ref 135–145)
Total Bilirubin: 1.8 mg/dL — ABNORMAL HIGH (ref 0.3–1.2)

## 2013-02-18 LAB — CBC
Hemoglobin: 13.9 g/dL (ref 12.0–15.0)
MCH: 31.9 pg (ref 26.0–34.0)
RBC: 4.36 MIL/uL (ref 3.87–5.11)

## 2013-02-18 MED ORDER — METHOCARBAMOL 500 MG PO TABS
500.0000 mg | ORAL_TABLET | Freq: Every day | ORAL | Status: DC | PRN
  Filled 2013-02-18: qty 1

## 2013-02-18 MED ORDER — POTASSIUM CHLORIDE CRYS ER 20 MEQ PO TBCR
40.0000 meq | EXTENDED_RELEASE_TABLET | Freq: Once | ORAL | Status: AC
  Administered 2013-02-18: 40 meq via ORAL
  Filled 2013-02-18: qty 2

## 2013-02-18 MED ORDER — NAPHAZOLINE HCL 0.1 % OP SOLN
1.0000 [drp] | Freq: Four times a day (QID) | OPHTHALMIC | Status: DC | PRN
  Filled 2013-02-18: qty 15

## 2013-02-18 MED ORDER — TRAMADOL HCL 50 MG PO TABS
50.0000 mg | ORAL_TABLET | Freq: Four times a day (QID) | ORAL | Status: DC | PRN
  Filled 2013-02-18: qty 1

## 2013-02-18 NOTE — Progress Notes (Signed)
Addendum  Patient seen and examined, chart and data base reviewed.  I agree with the above assessment and plan.  For full details please see Mrs. Imogene Burn PA note.  Diverticulitis, on clear liquids, Cipro and Flagyl.   Birdie Hopes, MD [facility] Pager: [phone] 02/18/2013, 1:00 PM

## 2013-02-18 NOTE — Progress Notes (Signed)
[facility] PROGRESS NOTE  Savannah Jackson [id number] DOB: 1956-10-27 DOA: 02/17/2013 PCP: Rosita Fire, MD  Assessment/Plan:  Diverticulitis - 3rd episode Still with abdominal pain that crosses her abdomen. Evidenced on CT Abd/Pelvis On Cipro and flagyl.   Tolerating clears. Previous colonoscopy done in [place].   Will need GI follow up on discharge.  Hypertension Moderate control  On metoprolol Will monitor.  Cirrhosis Stopped drinking ETOH 2 months ago. With coagulapathy and thrombocytopenia. LFTs elevated but stable compared to 2013.   DVT Prophylaxis: SCDs with coagulapathy and thrombocytopenia.  Code Status:  full Family Communication:  Disposition Plan: to home when able   Consultants:    Procedures:    Antibiotics:  cipro and flagyl  HPI/Subjective: Her pain is still present, but she's feeling better.  Normally her BMs are 3x a day and loose.  Objective: Filed Vitals:   02/17/13 1939 02/17/13 2000 02/17/13 2200 02/18/13 0507  BP: 163/102 155/95 169/99 152/77  Pulse: 81 74 80 73  Temp:   97.8 F (36.6 C) 98.3 F (36.8 C)  TempSrc:   Oral Oral  Resp:   20 22  Height:   5\' 3"  (1.6 m)   Weight:   76.7 kg (169 lb 1.5 oz)   SpO2: 93% 94% 94% 92%    Intake/Output Summary (Last 24 hours) at 02/18/13 1234 Last data filed at 02/18/13 0945  Gross per 24 hour  Intake 1178.75 ml  Output   1350 ml  Net -171.25 ml   Filed Weights   02/17/13 1744 02/17/13 2200  Weight: 73.936 kg (163 lb) 76.7 kg (169 lb 1.5 oz)    Exam:   General:  A&O, NAD, Lying comfortably in bed  Cardiovascular: RRR, no murmurs, rubs or gallops, no lower extremity edema  Respiratory: CTA, no wheeze, crackles, or rales.  No increased work of breathing.  Abdomen: Soft, mildly tender, non-distended, + bowel sounds, no masses  Musculoskeletal: Able to move all 4 extremities, 5/5 strength in each  Data Reviewed: Basic Metabolic Panel:  Recent Labs Lab  02/17/13 1756 02/18/13 0605  NA 138 142  K 3.1* 3.1*  CL 105 109  CO2 24 26  GLUCOSE 93 87  BUN 6 6  CREATININE 0.50 0.60  CALCIUM 8.3* 7.7*   Liver Function Tests:  Recent Labs Lab 02/17/13 1756 02/18/13 0605  AST 77* 74*  ALT 22 19  ALKPHOS 192* 158*  BILITOT 1.7* 1.8*  PROT 6.5 5.8*  ALBUMIN 2.1* 1.9*    Recent Labs Lab 02/17/13 1756  LIPASE 26   CBC:  Recent Labs Lab 02/17/13 1756 02/18/13 0605  WBC 6.2 6.3  NEUTROABS 3.7  --   HGB 15.1* 13.9  HCT 42.9 39.3  MCV 88.5 90.1  PLT 107* 103*     Studies: Ct Abdomen Pelvis W Contrast  02/17/2013  *RADIOLOGY REPORT*  Clinical Data: Left lower quadrant abdominal pain and left flank pain.  Nausea.  Cholecystectomy.  CT ABDOMEN AND PELVIS WITH CONTRAST  Technique:  Multidetector CT imaging of the abdomen and pelvis was performed following the standard protocol during bolus administration of intravenous contrast.  Contrast: 140mL OMNIPAQUE IOHEXOL 300 MG/ML  SOLN  Comparison: 09/05/2012  Findings: Lung bases:  Clear lung bases.  Mild cardiomegaly.  Trace right pleural fluid.  Abdomen/pelvis:  Moderate cirrhosis, with innumerable tiny lesions within, likely a regenerative nodules.  Grossly similar.  No dominant liver mass.  Portal vein patent.  Normal spleen, stomach, pancreas. Cholecystectomy without biliary ductal dilatation.  Normal  adrenal glands and kidneys. No retroperitoneal or retrocrural adenopathy.  Extensive colonic diverticulosis.  Edema surrounding the ascending colon on image 49/series 2. Normal terminal ileum and appendix. Normal caliber of small bowel loops.  Development of small volume abdominal ascites.  No pelvic adenopathy.  Normal urinary bladder.  Uterine fibroids which are grossly similar configuration. No adnexal mass or significant free fluid.  Bones/Musculoskeletal:  No acute osseous abnormality.  IMPRESSION:  1.  Edema surrounding the ascending colon.  Favor uncomplicated diverticulitis.  Infectious  colitis could look similar. 2.  Moderate cirrhosis with development of small volume abdominal ascites. 3.  New trace right pleural effusion. 4.  Uterine fibroids.   Original Report Authenticated By: Abigail Miyamoto, M.D.     Scheduled Meds: . ciprofloxacin  400 mg Intravenous Q12H  . enoxaparin (LOVENOX) injection  40 mg Subcutaneous Q24H  . lisinopril  20 mg Oral Daily  . metoprolol  50 mg Oral BID  . metronidazole  500 mg Intravenous Q8H  . potassium chloride  40 mEq Oral Once   Continuous Infusions:   Principal Problem:   Diverticulitis of colon without hemorrhage Active Problems:   Hypertension   Cirrhosis, alcoholic   Abdominal pain, left lower quadrant    Karen Kitchens  [facility] Pager [phone]. If 7PM-7AM, please contact night-coverage at [email or web address], password [facility] 02/18/2013, 12:34 PM  LOS: 1 day

## 2013-02-19 LAB — CBC
MCH: 31.4 pg (ref 26.0–34.0)
MCHC: 34.7 g/dL (ref 30.0–36.0)
Platelets: 95 10*3/uL — ABNORMAL LOW (ref 150–400)
RDW: 14.7 % (ref 11.5–15.5)

## 2013-02-19 LAB — BASIC METABOLIC PANEL
BUN: 5 mg/dL — ABNORMAL LOW (ref 6–23)
Calcium: 8 mg/dL — ABNORMAL LOW (ref 8.4–10.5)
Creatinine, Ser: 0.59 mg/dL (ref 0.50–1.10)
GFR calc non Af Amer: >90 mL/min (ref >90)
Glucose, Bld: 88 mg/dL (ref 70–99)
Sodium: 138 mEq/L (ref 135–145)

## 2013-02-19 MED ORDER — METRONIDAZOLE 500 MG PO TABS
500.0000 mg | ORAL_TABLET | Freq: Three times a day (TID) | ORAL | Status: DC
  Filled 2013-02-19 (×3): qty 1

## 2013-02-19 MED ORDER — CIPROFLOXACIN HCL 500 MG PO TABS: 500.0000 mg | ORAL_TABLET | Freq: Two times a day (BID) | ORAL | Status: DC

## 2013-02-19 MED ORDER — CIPROFLOXACIN HCL 500 MG PO TABS
500.0000 mg | ORAL_TABLET | Freq: Two times a day (BID) | ORAL | Status: DC
  Filled 2013-02-19 (×2): qty 1

## 2013-02-19 MED ORDER — METRONIDAZOLE 500 MG PO TABS: 500.0000 mg | ORAL_TABLET | Freq: Three times a day (TID) | ORAL | Status: DC

## 2013-02-19 NOTE — Progress Notes (Signed)
Utilization review completed.  

## 2013-02-19 NOTE — Care Management Note (Signed)
    Page 1 of 1   02/19/2013     3:27:28 PM   CARE MANAGEMENT NOTE 02/19/2013  Patient:  Savannah Jackson, Savannah Jackson   Account Number:  [id number]  Date Initiated:  02/19/2013  Documentation initiated by:  Tomi Bamberger  Subjective/Objective Assessment:   dx diverticulitis  admit lives with spouse, pta indep.     Action/Plan:   Anticipated DC Date:  02/19/2013   Anticipated DC Plan:  [place]  CM consult      Choice offered to / List presented to:             Status of service:  Completed, signed off Medicare Important Message given?   (If response is "NO", the following Medicare IM given date fields will be blank) Date Medicare IM given:   Date Additional Medicare IM given:    Discharge Disposition:  HOME/SELF CARE  Per UR Regulation:  Reviewed for med. necessity/level of care/duration of stay  If discussed at [place] of Stay Meetings, dates discussed:    Comments:  02/19/13 15:06 Tomi Bamberger RN, BSN [phone] patient lives with spouse, pta indep.  Patient has an appt with Deberah Castle NP on May 6, there was no appt availble for Dr. Ananias Pilgrim until june.  Called and left message on vm for patient date and time of appt.

## 2013-02-19 NOTE — Discharge Summary (Signed)
Physician Discharge Summary  Savannah Jackson [id number] DOB: August 29, 1956 DOA: 02/17/2013  PCP: Rosita Fire, MD  Admit date: 02/17/2013 Discharge date: 02/19/2013  Time spent: 45 minutes  Recommendations for Outpatient Follow-up:   1. See PCP in 1-2 weeks for hospital follow up after diverticulitis 2. See GI specialist in 3-6 weeks for possible colonoscopy (3rd episode of diverticulitis)    Discharge Diagnoses:  Principal Problem:   Diverticulitis of colon without hemorrhage Active Problems:   Hypertension   Cirrhosis, alcoholic   Abdominal pain, left lower quadrant   Discharge Condition: stable and able to tolerate solid diet  Diet recommendation: bland diet, comfort foods  Filed Weights   02/17/13 1744 02/17/13 2200  Weight: 73.936 kg (163 lb) 76.7 kg (169 lb 1.5 oz)    History of present illness:  Savannah Jackson is a 57 yo female with h/o alcoholism and cirrhosis and comes in with several days of lower abd pain and n/v. She denied blood in stools. No fevers but having chills. No etoh intake for about 2 months. Ct shows acute diverticulitis. Feels better with tx in ED already.   Hospital Course:   Diverticulitis - 3rd episode  Patient received Cipro and Flagyl. She received prescriptions for Cipro and Flagyl for total of 14 days of treatment She tolerated a regular diet, soft/solid upon discharge. Abdomen was soft and non tender on day of discharge. Will need GI follow up on discharge for colonoscopy-we have asked her to follow up with Dr Vernelle Emerald primary GI in the next 2-3 weeks  Hypertension  Moderate control on metoprolol  Have PCP monitor BP  Cirrhosis  Stopped drinking ETOH 2 months ago.  With coagulapathy and thrombocytopenia.  LFTs elevated but stable compared to 2013.   Procedures: CT abdomen Pelvis with contrast (02/17/13)  Consultations:  Pharmacology for Cipro  Discharge Exam: Filed Vitals:   02/18/13 1443 02/18/13 2128 02/19/13 0531  02/19/13 0947  BP: 151/99 165/96 147/81 154/90  Pulse: 58 62 67 65  Temp: 97.7 F (36.5 C) 99 F (37.2 C) 98.3 F (36.8 C)   TempSrc: Oral Oral Oral   Resp: 20 22 20    Height:      Weight:      SpO2: 98% 94% 96%     General: A&O, NAD, sitting up in bed Cardiovascular: RRR, no murmurs, rubs or gallops, no lower extremity edema  Respiratory: CTA, no wheeze, crackles, or rales. No increased work of breathing.  Abdomen: Soft, mildly tender, non-distended, + bowel sounds, no masses, palpable liver  Musculoskeletal: Able to move all 4 extremities, 5/5 strength in each  Discharge Instructions  Discharge Orders   Future Orders Complete By Expires     Diet general  As directed     Comments:      Bland diet    Increase activity slowly  As directed         Medication List    TAKE these medications       ciprofloxacin 500 MG tablet  Commonly known as:  CIPRO  Take 1 tablet (500 mg total) by mouth 2 (two) times daily.     lisinopril 20 MG tablet  Commonly known as:  PRINIVIL,ZESTRIL  Take 1 tablet (20 mg total) by mouth daily.     methocarbamol 500 MG tablet  Commonly known as:  ROBAXIN  Take 500 mg by mouth daily as needed. Muscle Spasms     metoprolol 50 MG tablet  Commonly known as:  LOPRESSOR  Take 50 mg  by mouth 2 (two) times daily.     metroNIDAZOLE 500 MG tablet  Commonly known as:  FLAGYL  Take 1 tablet (500 mg total) by mouth every 8 (eight) hours.     tetrahydrozoline-zinc 0.05-0.25 % ophthalmic solution  Commonly known as:  VISINE-AC  Place 2 drops into both eyes 3 (three) times daily as needed. Dry Eyes     VITAMIN D (CHOLECALCIFEROL) PO  Take 1 tablet by mouth daily.           Follow-up Information   Follow up with REHMAN,NAJEEB U, MD. Schedule an appointment as soon as possible for a visit in 3 weeks.   Contact information:   [place] [address] [phone]       Follow up with [facility], MD. Schedule an  appointment as soon as possible for a visit in 1 week.   Contact information:   [place] [address] [phone]        The results of significant diagnostics from this hospitalization (including imaging, microbiology, ancillary and laboratory) are listed below for reference.    Significant Diagnostic Studies: Ct Abdomen Pelvis W Contrast  02/17/2013  *RADIOLOGY REPORT*  Clinical Data: Left lower quadrant abdominal pain and left flank pain.  Nausea.  Cholecystectomy.  CT ABDOMEN AND PELVIS WITH CONTRAST  Technique:  Multidetector CT imaging of the abdomen and pelvis was performed following the standard protocol during bolus administration of intravenous contrast.  Contrast: 160mL OMNIPAQUE IOHEXOL 300 MG/ML  SOLN  Comparison: 09/05/2012  Findings: Lung bases:  Clear lung bases.  Mild cardiomegaly.  Trace right pleural fluid.  Abdomen/pelvis:  Moderate cirrhosis, with innumerable tiny lesions within, likely a regenerative nodules.  Grossly similar.  No dominant liver mass.  Portal vein patent.  Normal spleen, stomach, pancreas. Cholecystectomy without biliary ductal dilatation.  Normal adrenal glands and kidneys. No retroperitoneal or retrocrural adenopathy.  Extensive colonic diverticulosis.  Edema surrounding the ascending colon on image 49/series 2. Normal terminal ileum and appendix. Normal caliber of small bowel loops.  Development of small volume abdominal ascites.  No pelvic adenopathy.  Normal urinary bladder.  Uterine fibroids which are grossly similar configuration. No adnexal mass or significant free fluid.  Bones/Musculoskeletal:  No acute osseous abnormality.  IMPRESSION:  1.  Edema surrounding the ascending colon.  Favor uncomplicated diverticulitis.  Infectious colitis could look similar. 2.  Moderate cirrhosis with development of small volume abdominal ascites. 3.  New trace right pleural effusion. 4.  Uterine fibroids.   Original Report Authenticated By: Abigail Miyamoto, M.D.     Microbiology: No results found for this or any previous visit (from the past 240 hour(s)).   Labs: Basic Metabolic Panel:  Recent Labs Lab 02/17/13 1756 02/18/13 0605 02/19/13 0508  NA 138 142 138  K 3.1* 3.1* 3.5  CL 105 109 107  CO2 24 26 24   GLUCOSE 93 87 88  BUN 6 6 5*  CREATININE 0.50 0.60 0.59  CALCIUM 8.3* 7.7* 8.0*   Liver Function Tests:  Recent Labs Lab 02/17/13 1756 02/18/13 0605  AST 77* 74*  ALT 22 19  ALKPHOS 192* 158*  BILITOT 1.7* 1.8*  PROT 6.5 5.8*  ALBUMIN 2.1* 1.9*    Recent Labs Lab 02/17/13 1756  LIPASE 26   No results found for this basename: AMMONIA,  in the last 168 hours CBC:  Recent Labs Lab 02/17/13 1756 02/18/13 0605 02/19/13 0508  WBC 6.2 6.3 6.5  NEUTROABS 3.7  --   --  HGB 15.1* 13.9 14.3  HCT 42.9 39.3 41.2  MCV 88.5 90.1 90.5  PLT 107* 103* 95*      Signed: Orvan Seen, PA-S Karen Kitchens  [facility] 02/19/2013, 1:16 PM  Attending -Patient seen examined, agree with the above assessment and plan. Much better clinically this am, 1-2 loose stools, but abdomen is soft and non tender, she is tolerating a regular diet now. Also-she needs further workup and evaluation of underlying cirrhosis. I have asked to her to follow up with her primary GI -Dr Corbin Ade in 2-3 weeks time. She is stable for discharge  S Artemis Loyal

## 2013-02-19 NOTE — Progress Notes (Signed)
NURSING PROGRESS NOTE  Savannah Jackson [id number] Discharge Data: 02/19/2013 2:46 PM Attending Provider: Jonetta Osgood, MD [id number], MD     [name] to be D/C'd Home per MD order.  Discussed with the patient the After Visit Summary and all questions fully answered. All IV's discontinued with no bleeding noted. All belongings returned to patient for patient to take home.   Last Vital Signs:  Blood pressure 154/90, pulse 65, temperature 98.3 F (36.8 C), temperature source Oral, resp. rate 20, height 5\' 3"  (1.6 m), weight 76.7 kg (169 lb 1.5 oz), SpO2 96.00%.  Discharge Medication List   Medication List    TAKE these medications       ciprofloxacin 500 MG tablet  Commonly known as:  CIPRO  Take 1 tablet (500 mg total) by mouth 2 (two) times daily.     lisinopril 20 MG tablet  Commonly known as:  PRINIVIL,ZESTRIL  Take 1 tablet (20 mg total) by mouth daily.     methocarbamol 500 MG tablet  Commonly known as:  ROBAXIN  Take 500 mg by mouth daily as needed. Muscle Spasms     metoprolol 50 MG tablet  Commonly known as:  LOPRESSOR  Take 50 mg by mouth 2 (two) times daily.     metroNIDAZOLE 500 MG tablet  Commonly known as:  FLAGYL  Take 1 tablet (500 mg total) by mouth every 8 (eight) hours.     tetrahydrozoline-zinc 0.05-0.25 % ophthalmic solution  Commonly known as:  VISINE-AC  Place 2 drops into both eyes 3 (three) times daily as needed. Dry Eyes     VITAMIN D (CHOLECALCIFEROL) PO  Take 1 tablet by mouth daily.

## 2013-03-03 NOTE — Progress Notes (Signed)
Patient has OV w/ Terri 03/04/13, will sch'd TCS at that time

## 2013-03-04 ENCOUNTER — Telehealth (INDEPENDENT_AMBULATORY_CARE_PROVIDER_SITE_OTHER): Payer: Self-pay | Admitting: *Deleted

## 2013-03-04 ENCOUNTER — Ambulatory Visit (INDEPENDENT_AMBULATORY_CARE_PROVIDER_SITE_OTHER): Payer: Medicare HMO | Admitting: Internal Medicine

## 2013-03-04 ENCOUNTER — Other Ambulatory Visit (INDEPENDENT_AMBULATORY_CARE_PROVIDER_SITE_OTHER): Payer: Self-pay | Admitting: *Deleted

## 2013-03-04 ENCOUNTER — Encounter (INDEPENDENT_AMBULATORY_CARE_PROVIDER_SITE_OTHER): Payer: Self-pay | Admitting: Internal Medicine

## 2013-03-04 VITALS — BP 122/80 | HR 56 | Ht 63.0 in | Wt 165.7 lb

## 2013-03-04 DIAGNOSIS — K5732 Diverticulitis of large intestine without perforation or abscess without bleeding: Secondary | ICD-10-CM

## 2013-03-04 MED ORDER — PEG-KCL-NACL-NASULF-NA ASC-C 100 G PO SOLR: 1.0000 | Freq: Once | ORAL | Status: DC

## 2013-03-04 NOTE — Progress Notes (Signed)
Subjective:     Patient ID: Savannah Jackson, female   DOB: 1956/05/09, 57 y.o.   MRN: [id number]  HPI Referred to our office for diverticulitis. Recent admission to [facility] for uncomplicated diverticulitis. She was admitted x 3 days. This apparently is her 3rd episode of diverticulitis. She ordinally diagnosed with diverticulitis at [facility].   Received IV Cipro and Flagyl while in hospital. Presently taking Flagyl for her diverticulitis. Finished 11 day of po Cipro. There is no pain now. Appetite is good. No weight loss. No abdominal pain. Small amt of rectal bleeding. She has a BM daily. Her last colonoscopy at [facility] by Dr. Lindalou Hose about 10 yrs ago and was normal.      02/17/2013 CT abdomen/pelvis with CM; IMPRESSION:  1. Edema surrounding the ascending colon. Favor uncomplicated  diverticulitis. Infectious colitis could look similar.  2. Moderate cirrhosis with development of small volume abdominal  ascites.  3. New trace right pleural effusion.  4. Uterine fibroids.  CBC    Component Value Date/Time   WBC 6.5 02/19/2013 0508   RBC 4.55 02/19/2013 0508   HGB 14.3 02/19/2013 0508   HCT 41.2 02/19/2013 0508   PLT 95* 02/19/2013 0508   MCV 90.5 02/19/2013 0508   MCH 31.4 02/19/2013 0508   MCHC 34.7 02/19/2013 0508   RDW 14.7 02/19/2013 0508   LYMPHSABS 1.4 02/17/2013 1756   MONOABS 1.0 02/17/2013 1756   EOSABS 0.0 02/17/2013 1756   BASOSABS 0.1 02/17/2013 1756      Review of Systems see hpi has a current medication list which includes the following prescription(s): cyanocobalamin, lisinopril, metoprolol, metronidazole, tetrahydrozoline-zinc, and cholecalciferol. Current outpatient prescriptions:cyanocobalamin 500 MCG tablet, Take 500 mcg by mouth daily., Disp: , Rfl: ;  lisinopril (PRINIVIL,ZESTRIL) 20 MG tablet, Take 1 tablet (20 mg total) by mouth daily., Disp: 30 tablet, Rfl: 3;  metoprolol (LOPRESSOR) 50 MG tablet, Take 50 mg by mouth 2 (two) times daily., Disp: , Rfl: ;   metroNIDAZOLE (FLAGYL) 500 MG tablet, Take 1 tablet (500 mg total) by mouth every 8 (eight) hours., Disp: 33 tablet, Rfl: 0 tetrahydrozoline-zinc (VISINE-AC) 0.05-0.25 % ophthalmic solution, Place 2 drops into both eyes 3 (three) times daily as needed. Dry Eyes, Disp: , Rfl: ;  VITAMIN D, CHOLECALCIFEROL, PO, Take 1 tablet by mouth daily., Disp: , Rfl:  Past Medical History  Diagnosis Date  . Hypertension   . Brain aneurysm   . Cancer   . Rotator cuff syndrome of left shoulder   . DDD (degenerative disc disease), lumbar   . Chronic back pain Diverticultis  . Cirrhosis     alcoholic  . Sleep apnea    Past Surgical History  Procedure Laterality Date  . Cholecystectomy    . Brain surgery    . Breast surgery    . Total knee arthroplasty      right. 2002   Allergies  Allergen Reactions  . Latex Swelling    Discoloration of skin.   . Vicodin (Hydrocodone-Acetaminophen) Itching         Objective:   Physical Exam  Filed Vitals:   03/04/13 1110  BP: 122/80  Pulse: 56  Height: 5\' 3"  (1.6 m)  Weight: 165 lb 11.2 oz (75.161 kg)   Alert and oriented. Skin warm and dry. Oral mucosa is moist.   . Sclera anicteric, conjunctivae is pink. Thyroid not enlarged. No cervical lymphadenopathy. Lungs clear. Heart regular rate and rhythm.  Abdomen is soft. Bowel sounds are positive. Liver felt 3  fingerbreaths below sternum. No abdominal masses felt. No tenderness.  No edema to lower extremities.       Assessment:   diverticulitis resolved. No symptoms now.  Needs surveillance colonoscopy    Plan:     Colonoscopy with Dr. Laural Golden

## 2013-03-04 NOTE — Telephone Encounter (Signed)
Patient needs movi prep 

## 2013-03-04 NOTE — Patient Instructions (Addendum)
Colonoscopy with Dr. Rehman. The risks and benefits such as perforation, bleeding, and infection were reviewed with the patient and is agreeable. 

## 2013-03-31 ENCOUNTER — Encounter (HOSPITAL_COMMUNITY): Payer: Self-pay | Admitting: Pharmacy Technician

## 2013-04-10 ENCOUNTER — Encounter (HOSPITAL_COMMUNITY)
Admission: RE | Disposition: A | Discharge status: Home / Self Care | Payer: Self-pay | Source: Ambulatory Visit | Attending: Internal Medicine

## 2013-04-10 ENCOUNTER — Ambulatory Visit (HOSPITAL_COMMUNITY)
Admission: RE | Admit: 2013-04-10 | Discharge: 2013-04-10 | Disposition: A | Discharge status: Home / Self Care | Payer: Medicare HMO | Source: Ambulatory Visit | Attending: Internal Medicine | Admitting: Internal Medicine

## 2013-04-10 ENCOUNTER — Encounter (HOSPITAL_COMMUNITY): Payer: Self-pay | Admitting: *Deleted

## 2013-04-10 DIAGNOSIS — K5732 Diverticulitis of large intestine without perforation or abscess without bleeding: Secondary | ICD-10-CM | POA: Insufficient documentation

## 2013-04-10 DIAGNOSIS — K644 Residual hemorrhoidal skin tags: Secondary | ICD-10-CM | POA: Insufficient documentation

## 2013-04-10 DIAGNOSIS — K7460 Unspecified cirrhosis of liver: Secondary | ICD-10-CM | POA: Insufficient documentation

## 2013-04-10 DIAGNOSIS — D126 Benign neoplasm of colon, unspecified: Secondary | ICD-10-CM

## 2013-04-10 DIAGNOSIS — K5730 Diverticulosis of large intestine without perforation or abscess without bleeding: Secondary | ICD-10-CM

## 2013-04-10 DIAGNOSIS — Z8719 Personal history of other diseases of the digestive system: Secondary | ICD-10-CM

## 2013-04-10 DIAGNOSIS — I10 Essential (primary) hypertension: Secondary | ICD-10-CM | POA: Insufficient documentation

## 2013-04-10 HISTORY — PX: COLONOSCOPY: SHX5424

## 2013-04-10 LAB — HEPATIC FUNCTION PANEL
Alkaline Phosphatase: 148 U/L — ABNORMAL HIGH (ref 39–117)
Indirect Bilirubin: 0.8 mg/dL (ref 0.3–0.9)
Total Protein: 6 g/dL (ref 6.0–8.3)

## 2013-04-10 SURGERY — COLONOSCOPY
Anesthesia: Moderate Sedation

## 2013-04-10 MED ORDER — MIDAZOLAM HCL 5 MG/5ML IJ SOLN
INTRAMUSCULAR | Status: AC
  Filled 2013-04-10: qty 10

## 2013-04-10 MED ORDER — SODIUM CHLORIDE 0.9 % IV SOLN
INTRAVENOUS | Status: DC
  Administered 2013-04-10: 11:00:00 via INTRAVENOUS

## 2013-04-10 MED ORDER — MEPERIDINE HCL 50 MG/ML IJ SOLN
INTRAMUSCULAR | Status: AC
  Filled 2013-04-10: qty 1

## 2013-04-10 MED ORDER — STERILE WATER FOR IRRIGATION IR SOLN
Status: DC | PRN
  Administered 2013-04-10: 11:00:00

## 2013-04-10 MED ORDER — MEPERIDINE HCL 50 MG/ML IJ SOLN
INTRAMUSCULAR | Status: DC | PRN
  Administered 2013-04-10 (×2): 25 mg via INTRAVENOUS

## 2013-04-10 MED ORDER — MIDAZOLAM HCL 5 MG/5ML IJ SOLN
INTRAMUSCULAR | Status: DC | PRN
  Administered 2013-04-10: 1 mg via INTRAVENOUS
  Administered 2013-04-10: 2 mg via INTRAVENOUS
  Administered 2013-04-10: 1 mg via INTRAVENOUS
  Administered 2013-04-10: 2 mg via INTRAVENOUS

## 2013-04-10 NOTE — H&P (Signed)
Savannah Jackson is an 57 y.o. female.   Chief Complaint: Patient is here for colonoscopy. HPI: Patient is 57 year old African female who is here for diagnostic colonoscopy. She was admitted about 7 weeks ago for left-sided abdominal pain and CT suggesting diverticulitis. She responded to antibiotic therapy. She still having formed stools. She denies melena or rectal bleeding. She has history of alcoholic cirrhosis she hasn't had any alcohol in over 4 months. [name] was over 10 years ago . Family history is negative for CRC.  Past Medical History  Diagnosis Date  . Hypertension   . Brain aneurysm   . Cancer   . Rotator cuff syndrome of left shoulder   . DDD (degenerative disc disease), lumbar   . Chronic back pain Diverticultis  . Cirrhosis     alcoholic  . Sleep apnea     Past Surgical History  Procedure Laterality Date  . Cholecystectomy    . Brain surgery    . Breast surgery    . Total knee arthroplasty      right. 2002  . Cataract extraction Bilateral     [facility] 2 or 3 years ago    History reviewed. No pertinent family history. Social History:  reports that she has never smoked. She does not have any smokeless tobacco history on file. She reports that she does not drink alcohol or use illicit drugs.  Allergies:  Allergies  Allergen Reactions  . Latex Swelling    Discoloration of skin.   . Vicodin (Hydrocodone-Acetaminophen) Itching    Medications Prior to Admission  Medication Sig Dispense Refill  . cyanocobalamin 500 MCG tablet Take 500 mcg by mouth daily.      Marland Kitchen lisinopril (PRINIVIL,ZESTRIL) 20 MG tablet Take 1 tablet (20 mg total) by mouth daily.  30 tablet  3  . metoprolol (LOPRESSOR) 50 MG tablet Take 50 mg by mouth 2 (two) times daily.      Marland Kitchen VITAMIN D, CHOLECALCIFEROL, PO Take 1 tablet by mouth daily.        No results found for this or any previous visit (from the past 48 hour(s)). No results found.  ROS  Blood pressure 151/90,  pulse 55, temperature 97.8 F (36.6 C), temperature source Oral, resp. rate 20, height 5\' 3"  (1.6 m), weight 160 lb (72.576 kg), SpO2 96.00%. Physical Exam  Constitutional: She appears well-developed and well-nourished.  HENT:  Mouth/Throat: Oropharynx is clear and moist.  Eyes: Conjunctivae are normal. No scleral icterus.  Neck: No thyromegaly present.  Cardiovascular: Normal rate, regular rhythm and normal heart sounds.   No murmur heard. Respiratory: Effort normal and breath sounds normal.  GI: Soft.  Hepatomegaly. Liver is form. Spleen is nonpalpable. Rest of the abdomen is soft.  Musculoskeletal: She exhibits no edema.  Lymphadenopathy:    She has no cervical adenopathy.  Neurological: She is alert.  Skin: Skin is warm and dry.     Assessment/Plan History of diverticulitis. Diagnostic colonoscopy.  Nafeesah Lapaglia U 04/10/2013, 10:58 AM

## 2013-04-10 NOTE — Op Note (Signed)
COLONOSCOPY PROCEDURE REPORT  PATIENT:  Savannah Jackson  MR#:  [id number] Birthdate:  [birth year], 57 y.o., female Endoscopist:  Dr. Rogene Houston, MD Referred By:  Dr. Rosita Fire, MD Procedure Date: 04/10/2013  Procedure:   Colonoscopy  Indications:  Patient is 57 year old African female who was treated for diverticulitis about 7 weeks ago. She is fully recovered. She is here for diagnostic colonoscopy. [name] was over 10 years ago.  Informed Consent:  The procedure and risks were reviewed with the patient and informed consent was obtained.  Medications:  Demerol 50 mg IV Versed 6 mg IV  Description of procedure:  After a digital rectal exam was performed, that colonoscope was advanced from the anus through the rectum and colon to the area of the cecum, ileocecal valve and appendiceal orifice. The cecum was deeply intubated. These structures were well-seen and photographed for the record. From the level of the cecum and ileocecal valve, the scope was slowly and cautiously withdrawn. The mucosal surfaces were carefully surveyed utilizing scope tip to flexion to facilitate fold flattening as needed. The scope was pulled down into the rectum where a thorough exam including retroflexion was performed.  Findings:   Prep satisfactory. Scattered over tubular throughout the colon but most of these were located at sigmoid colon. 8-10 mm sessile polyp at cecum to the right of appendiceal orifice. Most of the polyp was ablated via cold biopsy and rest of the polyp was ablated with APC. Threel polyps cold snared and submitted together. To what it had ascending colon and the third one was at splenic flexure. Normal rectal mucosa. Small hemorrhoids below the dentate line.   Therapeutic/Diagnostic Maneuvers Performed:  See above.  Complications:  None  Cecal Withdrawal Time:  16 nutes  Impression:  Examination performed to cecum. Pancolonic diverticulosis. 8-10 mm  sessile polyp removed from cecum with combination of cold biopsy and APC. Threel polyps cold snared and submitted together(two at ascending colon and one at splenic flexure). Mauled external hemorrhoids.  Recommendations:  Standard instructions given. I will contact patient with biopsy results and further recommendations.  Savannah Jackson U  04/10/2013 11:56 AM  CC: Dr. Rosita Fire, MD & Dr. Rayne Du ref. provider found

## 2013-04-11 LAB — AFP TUMOR MARKER: AFP-Tumor Marker: 13.8 ng/mL — ABNORMAL HIGH (ref 0.0–8.0)

## 2013-04-14 ENCOUNTER — Telehealth (INDEPENDENT_AMBULATORY_CARE_PROVIDER_SITE_OTHER): Payer: Self-pay | Admitting: *Deleted

## 2013-04-14 ENCOUNTER — Encounter (HOSPITAL_COMMUNITY): Payer: Self-pay | Admitting: Internal Medicine

## 2013-04-14 DIAGNOSIS — K5732 Diverticulitis of large intestine without perforation or abscess without bleeding: Secondary | ICD-10-CM

## 2013-04-14 NOTE — Telephone Encounter (Signed)
Per Dr.Rehman the patient will need to have labs drawn in 2 months. 

## 2013-04-18 ENCOUNTER — Encounter (INDEPENDENT_AMBULATORY_CARE_PROVIDER_SITE_OTHER): Payer: Self-pay | Admitting: *Deleted

## 2013-05-29 ENCOUNTER — Encounter (INDEPENDENT_AMBULATORY_CARE_PROVIDER_SITE_OTHER): Payer: Self-pay | Admitting: *Deleted

## 2013-05-29 ENCOUNTER — Other Ambulatory Visit (INDEPENDENT_AMBULATORY_CARE_PROVIDER_SITE_OTHER): Payer: Self-pay | Admitting: *Deleted

## 2013-05-29 DIAGNOSIS — K5732 Diverticulitis of large intestine without perforation or abscess without bleeding: Secondary | ICD-10-CM

## 2013-09-02 ENCOUNTER — Other Ambulatory Visit (HOSPITAL_COMMUNITY): Payer: Self-pay | Admitting: Internal Medicine

## 2013-09-02 DIAGNOSIS — Z139 Encounter for screening, unspecified: Secondary | ICD-10-CM

## 2013-09-04 ENCOUNTER — Ambulatory Visit (HOSPITAL_COMMUNITY)
Admission: RE | Admit: 2013-09-04 | Discharge: 2013-09-04 | Disposition: A | Discharge status: Home / Self Care | Payer: Medicare HMO | Source: Ambulatory Visit | Attending: Internal Medicine | Admitting: Internal Medicine

## 2013-09-04 DIAGNOSIS — Z1231 Encounter for screening mammogram for malignant neoplasm of breast: Secondary | ICD-10-CM | POA: Insufficient documentation

## 2013-09-04 DIAGNOSIS — Z139 Encounter for screening, unspecified: Secondary | ICD-10-CM

## 2013-10-25 ENCOUNTER — Encounter (HOSPITAL_COMMUNITY): Payer: Self-pay | Admitting: Emergency Medicine

## 2013-10-25 ENCOUNTER — Emergency Department (HOSPITAL_COMMUNITY): Payer: Medicare HMO

## 2013-10-25 ENCOUNTER — Emergency Department (HOSPITAL_COMMUNITY)
Admission: EM | Admit: 2013-10-25 | Discharge: 2013-10-25 | Disposition: A | Discharge status: Home / Self Care | Payer: Medicare HMO | Attending: Emergency Medicine | Admitting: Emergency Medicine

## 2013-10-25 DIAGNOSIS — K5900 Constipation, unspecified: Secondary | ICD-10-CM | POA: Insufficient documentation

## 2013-10-25 DIAGNOSIS — R609 Edema, unspecified: Secondary | ICD-10-CM | POA: Insufficient documentation

## 2013-10-25 DIAGNOSIS — R601 Generalized edema: Secondary | ICD-10-CM

## 2013-10-25 DIAGNOSIS — K7460 Unspecified cirrhosis of liver: Secondary | ICD-10-CM | POA: Insufficient documentation

## 2013-10-25 DIAGNOSIS — R188 Other ascites: Secondary | ICD-10-CM

## 2013-10-25 DIAGNOSIS — Z9889 Other specified postprocedural states: Secondary | ICD-10-CM | POA: Insufficient documentation

## 2013-10-25 DIAGNOSIS — Z8739 Personal history of other diseases of the musculoskeletal system and connective tissue: Secondary | ICD-10-CM | POA: Insufficient documentation

## 2013-10-25 DIAGNOSIS — Z9089 Acquired absence of other organs: Secondary | ICD-10-CM | POA: Insufficient documentation

## 2013-10-25 DIAGNOSIS — Z859 Personal history of malignant neoplasm, unspecified: Secondary | ICD-10-CM | POA: Insufficient documentation

## 2013-10-25 DIAGNOSIS — I10 Essential (primary) hypertension: Secondary | ICD-10-CM | POA: Insufficient documentation

## 2013-10-25 DIAGNOSIS — Z91040 Latex allergy status: Secondary | ICD-10-CM | POA: Insufficient documentation

## 2013-10-25 DIAGNOSIS — Z8669 Personal history of other diseases of the nervous system and sense organs: Secondary | ICD-10-CM | POA: Insufficient documentation

## 2013-10-25 DIAGNOSIS — Z79899 Other long term (current) drug therapy: Secondary | ICD-10-CM | POA: Insufficient documentation

## 2013-10-25 LAB — LIPASE, BLOOD: Lipase: 32 U/L (ref 11–59)

## 2013-10-25 LAB — COMPREHENSIVE METABOLIC PANEL
ALT: 22 U/L (ref 0–35)
AST: 78 U/L — ABNORMAL HIGH (ref 0–37)
Albumin: 1.8 g/dL — ABNORMAL LOW (ref 3.5–5.2)
Alkaline Phosphatase: 255 U/L — ABNORMAL HIGH (ref 39–117)
BUN: 7 mg/dL (ref 6–23)
CO2: 23 mEq/L (ref 19–32)
Calcium: 8.1 mg/dL — ABNORMAL LOW (ref 8.4–10.5)
Chloride: 104 mEq/L (ref 96–112)
Creatinine, Ser: 0.53 mg/dL (ref 0.50–1.10)
GFR calc Af Amer: >90 mL/min (ref >90)
GFR calc non Af Amer: >90 mL/min (ref >90)
Glucose, Bld: 116 mg/dL — ABNORMAL HIGH (ref 70–99)
Potassium: 3.7 mEq/L (ref 3.5–5.1)
Sodium: 135 mEq/L (ref 135–145)
Total Bilirubin: 2.3 mg/dL — ABNORMAL HIGH (ref 0.3–1.2)
Total Protein: 6.6 g/dL (ref 6.0–8.3)

## 2013-10-25 LAB — CBC WITH DIFFERENTIAL/PLATELET
Basophils Relative: 3 % — ABNORMAL HIGH (ref 0–1)
Hemoglobin: 14.7 g/dL (ref 12.0–15.0)
MCHC: 34.7 g/dL (ref 30.0–36.0)
Monocytes Relative: 14 % — ABNORMAL HIGH (ref 3–12)
Neutro Abs: 4.5 10*3/uL (ref 1.7–7.7)
Neutrophils Relative %: 64 % (ref 43–77)
RBC: 4.59 MIL/uL (ref 3.87–5.11)

## 2013-10-25 LAB — URINALYSIS, ROUTINE W REFLEX MICROSCOPIC
Leukocytes, UA: NEGATIVE
Nitrite: NEGATIVE
Specific Gravity, Urine: >1.03 — ABNORMAL HIGH (ref 1.005–1.030)
Urobilinogen, UA: 1 mg/dL (ref 0.0–1.0)
pH: 6 (ref 5.0–8.0)

## 2013-10-25 LAB — URINE MICROSCOPIC-ADD ON

## 2013-10-25 MED ORDER — IOHEXOL 300 MG/ML  SOLN
50.0000 mL | Freq: Once | INTRAMUSCULAR | Status: AC | PRN
  Administered 2013-10-25: 50 mL via ORAL

## 2013-10-25 MED ORDER — IOHEXOL 300 MG/ML  SOLN
100.0000 mL | Freq: Once | INTRAMUSCULAR | Status: AC | PRN
  Administered 2013-10-25: 100 mL via INTRAVENOUS

## 2013-10-25 MED ORDER — FUROSEMIDE 10 MG/ML IJ SOLN
40.0000 mg | Freq: Once | INTRAMUSCULAR | Status: AC
  Administered 2013-10-25: 40 mg via INTRAVENOUS
  Filled 2013-10-25: qty 4

## 2013-10-25 MED ORDER — POTASSIUM CHLORIDE CRYS ER 20 MEQ PO TBCR
40.0000 meq | EXTENDED_RELEASE_TABLET | Freq: Once | ORAL | Status: AC
  Administered 2013-10-25: 40 meq via ORAL
  Filled 2013-10-25: qty 2

## 2013-10-25 MED ORDER — SODIUM CHLORIDE 0.9 % IJ SOLN
INTRAMUSCULAR | Status: AC
  Filled 2013-10-25: qty 250

## 2013-10-25 MED ORDER — FUROSEMIDE 20 MG PO TABS: 20.0000 mg | ORAL_TABLET | Freq: Two times a day (BID) | ORAL | Status: DC

## 2013-10-25 MED ORDER — POTASSIUM CHLORIDE ER 20 MEQ PO TBCR: 20.0000 meq | EXTENDED_RELEASE_TABLET | Freq: Two times a day (BID) | ORAL | Status: DC

## 2013-10-25 NOTE — ED Notes (Signed)
Pt c/o abd pain, nausea, abd swelling, and no bm x 1 week.    Bowel sounds present.    Pt says took laxative last Sunday and had small results.

## 2013-10-25 NOTE — ED Notes (Signed)
Pt has finished second cup of contrast, ct notified, pt comfortable at present, refuses any offer of nausea medication,

## 2013-10-25 NOTE — ED Notes (Signed)
Pt ambulatory to restroom, tolerated well,  

## 2013-10-25 NOTE — ED Notes (Signed)
Pt c/o generalized abd pain, abd distention, nausea,constipation that started a week ago, states that she has been having bowel movements but that they are small and yellow, does have to strain with the bowel movement, last bm was a few minutes ago,

## 2013-10-25 NOTE — ED Provider Notes (Signed)
CSN: [id number]     Arrival date & time 10/25/13  1131 History   This chart was scribed for Savannah Manifold, MD by Celesta Gentile, ED Scribe. The patient was seen in room APA19/APA19. Patient's care was started at 1:08 PM.    Chief Complaint  Patient presents with  . Abdominal Pain  . Constipation   The history is provided by the patient and the spouse. No language interpreter was used.   HPI Comments: Savannah Jackson is a 57 y.o. female with a h/o of diverticulitis and cirrhosis who presents to the Emergency Department complaining of worsening abdominal pain that began 7 days ago with associated abdominal distension, constipation and nausea.  Pt states she hasn't had a normal bowel movement since last week.  She took a laxative earlier this week which resulted in minimal yellow stool.  She states her belly isn't normally this swollen and she has had worsening distension over the past week.  Pt reports she is urinating infrequently and has also had some dark, reddish urine.  Pt also states she has had swelling in her legs and ankles for about a week and a half.  She denies vomiting or difficulty breathing.  Pt notes that she took some Imodium before her constipation began but her distension had already begun before that.  She is not on pain medication.  She denies changes in her medications.  She denies h/o abdominal surgery.  She denies h/o previous issues with fluid accumulation in her abdomen to her knowledge.    Past Medical History  Diagnosis Date  . Hypertension   . Brain aneurysm   . Cancer   . Rotator cuff syndrome of left shoulder   . DDD (degenerative disc disease), lumbar   . Chronic back pain Diverticultis  . Cirrhosis     alcoholic  . Sleep apnea    Past Surgical History  Procedure Laterality Date  . Cholecystectomy    . Brain surgery    . Breast surgery    . Total knee arthroplasty      right. 2002  . Cataract extraction Bilateral     [facility] 2 or 3 years ago  .  Colonoscopy N/A 04/10/2013    Procedure: COLONOSCOPY;  Surgeon: Rogene Houston, MD;  Location: [facility];  Service: Endoscopy;  Laterality: N/A;  1030-rescheduled to [place] notified pt   No family history on file. History  Substance Use Topics  . Smoking status: Never Smoker   . Smokeless tobacco: Not on file  . Alcohol Use: No     Comment: occ   OB History   Grav Para Term Preterm Abortions TAB SAB Ect Mult Living                 Review of Systems  Gastrointestinal: Positive for nausea, abdominal pain and constipation. Negative for vomiting and diarrhea.  All other systems reviewed and are negative.    Allergies  Latex and Vicodin  Home Medications   Current Outpatient Rx  Name  Route  Sig  Dispense  Refill  . cyanocobalamin 500 MCG tablet   Oral   Take 500 mcg by mouth daily.         Marland Kitchen lisinopril (PRINIVIL,ZESTRIL) 20 MG tablet   Oral   Take 1 tablet (20 mg total) by mouth daily.   30 tablet   3   . metoprolol (LOPRESSOR) 50 MG tablet   Oral   Take 50 mg by mouth 2 (two) times  daily.         Marland Kitchen VITAMIN D, CHOLECALCIFEROL, PO   Oral   Take 1 tablet by mouth daily.          BP 190/113  Pulse 60  Temp(Src) 97.8 F (36.6 C) (Oral)  Resp 16  Ht 5\' 3"  (1.6 m)  Wt 161 lb (73.029 kg)  BMI 28.53 kg/m2  SpO2 98% Physical Exam  Nursing note and vitals reviewed. Constitutional: She appears well-developed and well-nourished. No distress.  HENT:  Head: Normocephalic and atraumatic.  Eyes: Conjunctivae are normal. Right eye exhibits no discharge. Left eye exhibits no discharge.  Neck: Neck supple.  Cardiovascular: Normal rate, regular rhythm and normal heart sounds.  Exam reveals no gallop and no friction rub.   No murmur heard. Pulmonary/Chest: Effort normal and breath sounds normal. No respiratory distress.  Abdominal: Soft. She exhibits distension. There is tenderness.  Fluid wave  Mild suprapubic and LLQ tenderness  Neurological: She is alert.   Skin: Skin is warm and dry.  2+ pitting lower extremity edema, no calf tenderness   Psychiatric: She has a normal mood and affect. Her behavior is normal. Thought content normal.    ED Course  Procedures (including critical care time) DIAGNOSTIC STUDIES: Oxygen Saturation is 98% on RA, normal by my interpretation.    COORDINATION OF CARE: 2:24 PM-Patient informed of current plan of treatment and evaluation and agrees with plan.     Labs Review Labs Reviewed  CBC WITH DIFFERENTIAL - Abnormal; Notable for the following:    Monocytes Relative 14 (*)    Basophils Relative 3 (*)    Basophils Absolute 0.2 (*)    All other components within normal limits  COMPREHENSIVE METABOLIC PANEL - Abnormal; Notable for the following:    Glucose, Bld 116 (*)    Calcium 8.1 (*)    Albumin 1.8 (*)    AST 78 (*)    Alkaline Phosphatase 255 (*)    Total Bilirubin 2.3 (*)    All other components within normal limits  URINALYSIS, ROUTINE W REFLEX MICROSCOPIC - Abnormal; Notable for the following:    Color, Urine BROWN (*)    APPearance HAZY (*)    Specific Gravity, Urine >1.030 (*)    Hgb urine dipstick LARGE (*)    Bilirubin Urine LARGE (*)    Protein, ur >300 (*)    All other components within normal limits  URINE MICROSCOPIC-ADD ON - Abnormal; Notable for the following:    Squamous Epithelial / LPF FEW (*)    Bacteria, UA FEW (*)    Casts HYALINE CASTS (*)    All other components within normal limits  LIPASE, BLOOD   Imaging Review No results found.  Ct Abdomen Pelvis W Contrast  10/25/2013   CLINICAL DATA:  Abdominal pain  EXAM: CT ABDOMEN AND PELVIS WITH CONTRAST  TECHNIQUE: Multidetector CT imaging of the abdomen and pelvis was performed using the standard protocol following bolus administration of intravenous contrast.  CONTRAST:  59mL OMNIPAQUE IOHEXOL 300 MG/ML SOLN, 128mL OMNIPAQUE IOHEXOL 300 MG/ML SOLN  COMPARISON:  02/17/2013  FINDINGS: Tiny right pleural effusion and deep and  atelectasis.  Heterogeneous and cirrhotic liver.  Ascites is worse.  Pancreas, spleen, adrenal glands, and kidneys are within normal limits.  Postcholecystectomy.  Diffuse stranding of the subcutaneous and intra abdominal fat compatible with 3rd spacing.  Uterus remains heterogeneous likely due to fibroids. Bladder is unremarkable. Ovaries are within normal limits.  Mild wall thickening of the colon  at the hepatic flexure is nonspecific.  IMPRESSION: Cirrhotic liver.  Ascites is worse.  Anasarca is worse.  Liver remains heterogeneous.  Diffuse tumor is not excluded.  Mild wall thickening of the colon at the hepatic flexure is nonspecific. Inflammatory process is not excluded.  Tiny right pleural effusion.  Chronic changes.   Electronically Signed   By: Maryclare Bean M.D.   On: 10/25/2013 15:00   EKG Interpretation   None       MDM   1. Cirrhosis   2. Anasarca   3. Ascites    57 year old female with increasing abdominal distention and nausea. Examination and imaging consistent with cirrhosis and ascites. She has a past history significant for alcoholic cirrhosis. Her abdomen is soft and nontender. She has no respiratory complaints. Very low suspicion for spontaneous bacterial peritonitis. Diagnostic paracentesis likely of little utility at this time given her known past medical history. She does not have any significant respiratory complaints for me to attempt a therapeutic paracentesis. She has not been previously on diuretics. Will start these with potassium supplementation. Discussed with patient the importance of following up with her primary care provider or gastroenterology. Discussed that she may potentially need to be on these medications long-term, have the dose adjusted or require additional medications. Return precautions were discussed.  I personally preformed the services scribed in my presence. The recorded information has been reviewed is accurate. Savannah Manifold, MD.    Savannah Manifold,  MD 10/30/13 1430

## 2014-09-10 ENCOUNTER — Other Ambulatory Visit (HOSPITAL_COMMUNITY): Payer: Self-pay | Admitting: Internal Medicine

## 2014-09-10 DIAGNOSIS — Z1231 Encounter for screening mammogram for malignant neoplasm of breast: Secondary | ICD-10-CM

## 2014-09-21 ENCOUNTER — Ambulatory Visit (HOSPITAL_COMMUNITY): Payer: Medicare HMO

## 2014-09-30 ENCOUNTER — Ambulatory Visit (HOSPITAL_COMMUNITY)
Admission: RE | Admit: 2014-09-30 | Discharge: 2014-09-30 | Disposition: A | Discharge status: Home / Self Care | Payer: Medicare HMO | Source: Ambulatory Visit | Attending: Internal Medicine | Admitting: Internal Medicine

## 2014-09-30 DIAGNOSIS — Z1231 Encounter for screening mammogram for malignant neoplasm of breast: Secondary | ICD-10-CM | POA: Diagnosis present

## 2015-01-06 ENCOUNTER — Encounter (HOSPITAL_COMMUNITY): Payer: Self-pay

## 2015-01-06 ENCOUNTER — Emergency Department (HOSPITAL_COMMUNITY)
Admission: EM | Admit: 2015-01-06 | Discharge: 2015-01-06 | Disposition: A | Discharge status: Home / Self Care | Payer: Commercial Managed Care - HMO | Attending: Emergency Medicine | Admitting: Emergency Medicine

## 2015-01-06 ENCOUNTER — Emergency Department (HOSPITAL_COMMUNITY): Payer: Commercial Managed Care - HMO

## 2015-01-06 DIAGNOSIS — J209 Acute bronchitis, unspecified: Secondary | ICD-10-CM | POA: Insufficient documentation

## 2015-01-06 DIAGNOSIS — I10 Essential (primary) hypertension: Secondary | ICD-10-CM | POA: Diagnosis not present

## 2015-01-06 DIAGNOSIS — Z8739 Personal history of other diseases of the musculoskeletal system and connective tissue: Secondary | ICD-10-CM | POA: Insufficient documentation

## 2015-01-06 DIAGNOSIS — G8929 Other chronic pain: Secondary | ICD-10-CM | POA: Insufficient documentation

## 2015-01-06 DIAGNOSIS — B9689 Other specified bacterial agents as the cause of diseases classified elsewhere: Secondary | ICD-10-CM | POA: Diagnosis not present

## 2015-01-06 DIAGNOSIS — R509 Fever, unspecified: Secondary | ICD-10-CM | POA: Diagnosis not present

## 2015-01-06 DIAGNOSIS — Z859 Personal history of malignant neoplasm, unspecified: Secondary | ICD-10-CM | POA: Diagnosis not present

## 2015-01-06 DIAGNOSIS — N390 Urinary tract infection, site not specified: Secondary | ICD-10-CM | POA: Diagnosis not present

## 2015-01-06 DIAGNOSIS — Z91040 Latex allergy status: Secondary | ICD-10-CM | POA: Diagnosis not present

## 2015-01-06 DIAGNOSIS — Z8719 Personal history of other diseases of the digestive system: Secondary | ICD-10-CM | POA: Diagnosis not present

## 2015-01-06 DIAGNOSIS — J40 Bronchitis, not specified as acute or chronic: Secondary | ICD-10-CM | POA: Diagnosis not present

## 2015-01-06 DIAGNOSIS — Z79899 Other long term (current) drug therapy: Secondary | ICD-10-CM | POA: Diagnosis not present

## 2015-01-06 DIAGNOSIS — R05 Cough: Secondary | ICD-10-CM | POA: Diagnosis not present

## 2015-01-06 DIAGNOSIS — R1110 Vomiting, unspecified: Secondary | ICD-10-CM | POA: Diagnosis present

## 2015-01-06 LAB — URINALYSIS, ROUTINE W REFLEX MICROSCOPIC
Bilirubin Urine: NEGATIVE
GLUCOSE, UA: NEGATIVE mg/dL
KETONES UR: NEGATIVE mg/dL
Nitrite: POSITIVE — AB
Protein, ur: 100 mg/dL — AB
SPECIFIC GRAVITY, URINE: 1.02 (ref 1.005–1.030)
Urobilinogen, UA: 0.2 mg/dL (ref 0.0–1.0)
pH: 6 (ref 5.0–8.0)

## 2015-01-06 LAB — COMPREHENSIVE METABOLIC PANEL
ALBUMIN: 2 g/dL — AB (ref 3.5–5.2)
ALK PHOS: 182 U/L — AB (ref 39–117)
ALT: 28 U/L (ref 0–35)
ANION GAP: 6 (ref 5–15)
AST: 74 U/L — ABNORMAL HIGH (ref 0–37)
BUN: 28 mg/dL — ABNORMAL HIGH (ref 6–23)
CO2: 24 mmol/L (ref 19–32)
Calcium: 8.3 mg/dL — ABNORMAL LOW (ref 8.4–10.5)
Chloride: 107 mmol/L (ref 96–112)
Creatinine, Ser: 1.21 mg/dL — ABNORMAL HIGH (ref 0.50–1.10)
GFR calc non Af Amer: 48 mL/min — ABNORMAL LOW (ref >90)
GFR, EST AFRICAN AMERICAN: 56 mL/min — AB (ref >90)
GLUCOSE: 94 mg/dL (ref 70–99)
Potassium: 4.4 mmol/L (ref 3.5–5.1)
SODIUM: 137 mmol/L (ref 135–145)
Total Bilirubin: 1.6 mg/dL — ABNORMAL HIGH (ref 0.3–1.2)
Total Protein: 6.1 g/dL (ref 6.0–8.3)

## 2015-01-06 LAB — CBC WITH DIFFERENTIAL/PLATELET
Basophils Absolute: 0.2 10*3/uL — ABNORMAL HIGH (ref 0.0–0.1)
Basophils Relative: 3 % — ABNORMAL HIGH (ref 0–1)
Eosinophils Absolute: 0.3 10*3/uL (ref 0.0–0.7)
Eosinophils Relative: 5 % (ref 0–5)
HCT: 42.1 % (ref 36.0–46.0)
HEMOGLOBIN: 14.1 g/dL (ref 12.0–15.0)
LYMPHS ABS: 1.9 10*3/uL (ref 0.7–4.0)
Lymphocytes Relative: 30 % (ref 12–46)
MCH: 31 pg (ref 26.0–34.0)
MCHC: 33.5 g/dL (ref 30.0–36.0)
MCV: 92.5 fL (ref 78.0–100.0)
Monocytes Absolute: 1.1 10*3/uL — ABNORMAL HIGH (ref 0.1–1.0)
Monocytes Relative: 17 % — ABNORMAL HIGH (ref 3–12)
Neutro Abs: 2.8 10*3/uL (ref 1.7–7.7)
Neutrophils Relative %: 45 % (ref 43–77)
PLATELETS: 156 10*3/uL (ref 150–400)
RBC: 4.55 MIL/uL (ref 3.87–5.11)
RDW: 14.7 % (ref 11.5–15.5)
WBC: 6.3 10*3/uL (ref 4.0–10.5)

## 2015-01-06 LAB — URINE MICROSCOPIC-ADD ON

## 2015-01-06 LAB — LIPASE, BLOOD: LIPASE: 28 U/L (ref 11–59)

## 2015-01-06 MED ORDER — ONDANSETRON 4 MG PO TBDP: ORAL_TABLET | ORAL | Status: DC

## 2015-01-06 MED ORDER — SODIUM CHLORIDE 0.9 % IV BOLUS (SEPSIS)
1000.0000 mL | Freq: Once | INTRAVENOUS | Status: AC
  Administered 2015-01-06: 1000 mL via INTRAVENOUS

## 2015-01-06 MED ORDER — ONDANSETRON HCL 4 MG/2ML IJ SOLN
4.0000 mg | Freq: Once | INTRAMUSCULAR | Status: AC
  Administered 2015-01-06: 4 mg via INTRAVENOUS
  Filled 2015-01-06: qty 2

## 2015-01-06 MED ORDER — DEXTROSE 5 % IV SOLN
1.0000 g | Freq: Once | INTRAVENOUS | Status: AC
  Administered 2015-01-06: 1 g via INTRAVENOUS
  Filled 2015-01-06: qty 10

## 2015-01-06 MED ORDER — LEVOFLOXACIN 500 MG PO TABS: 500.0000 mg | ORAL_TABLET | Freq: Every day | ORAL | Status: DC

## 2015-01-06 NOTE — ED Notes (Signed)
Vomiting with diarrhea as well x1 week.

## 2015-01-06 NOTE — Discharge Instructions (Signed)
Drink plenty of fluids and follow up with dr. Legrand Rams next week

## 2015-01-06 NOTE — ED Provider Notes (Signed)
CSN: [id number]     Arrival date & time 01/06/15  1004 History   First MD Initiated Contact with Patient 01/06/15 1318     Chief Complaint  Patient presents with  . Emesis     (Consider location/radiation/quality/duration/timing/severity/associated sxs/prior Treatment) Patient is a 59 y.o. female presenting with vomiting. The history is provided by the patient (pt complains of cough and vomiting for a couple weeks).  Emesis Severity:  Mild Timing:  Intermittent Quality:  Undigested food Able to tolerate:  Liquids Progression:  Unchanged Chronicity:  New Recent urination:  Normal Associated symptoms: no abdominal pain, no diarrhea and no headaches     Past Medical History  Diagnosis Date  . Hypertension   . Brain aneurysm   . Cancer   . Rotator cuff syndrome of left shoulder   . DDD (degenerative disc disease), lumbar   . Chronic back pain Diverticultis  . Cirrhosis     alcoholic  . Sleep apnea    Past Surgical History  Procedure Laterality Date  . Cholecystectomy    . Brain surgery    . Breast surgery    . Total knee arthroplasty      right. 2002  . Cataract extraction Bilateral     [facility] 2 or 3 years ago  . Colonoscopy N/A 04/10/2013    Procedure: COLONOSCOPY;  Surgeon: Rogene Houston, MD;  Location: [facility];  Service: Endoscopy;  Laterality: N/A;  1030-rescheduled to [place] notified pt   No family history on file. History  Substance Use Topics  . Smoking status: Never Smoker   . Smokeless tobacco: Not on file  . Alcohol Use: No     Comment: occ   OB History    No data available     Review of Systems  Constitutional: Negative for appetite change and fatigue.  HENT: Negative for congestion, ear discharge and sinus pressure.   Eyes: Negative for discharge.  Respiratory: Positive for cough.   Cardiovascular: Negative for chest pain.  Gastrointestinal: Positive for vomiting. Negative for abdominal pain and diarrhea.  Genitourinary: Negative for  frequency and hematuria.  Musculoskeletal: Negative for back pain.  Skin: Negative for rash.  Neurological: Negative for seizures and headaches.  Psychiatric/Behavioral: Negative for hallucinations.      Allergies  Latex and Vicodin  Home Medications   Prior to Admission medications   Medication Sig Start Date End Date Taking? Authorizing Provider  diphenhydramine-acetaminophen (TYLENOL PM) 25-500 MG TABS Take 1 tablet by mouth at bedtime as needed.   Yes Historical Provider, MD  furosemide (LASIX) 20 MG tablet Take 1 tablet (20 mg total) by mouth 2 (two) times daily. 10/25/13  Yes [name], MD  guaiFENesin (MUCINEX) 600 MG 12 hr tablet Take 600 mg by mouth 2 (two) times daily.   Yes Historical Provider, MD  lisinopril (PRINIVIL,ZESTRIL) 20 MG tablet Take 1 tablet (20 mg total) by mouth daily. 07/18/12  Yes Rosita Fire, MD  metoprolol (LOPRESSOR) 50 MG tablet Take 50 mg by mouth 2 (two) times daily.   Yes Historical Provider, MD  potassium chloride 20 MEQ TBCR Take 20 mEq by mouth 2 (two) times daily. 10/25/13  Yes [name], MD  levofloxacin (LEVAQUIN) 500 MG tablet Take 1 tablet (500 mg total) by mouth daily. 01/06/15   Milton Ferguson, MD  ondansetron (ZOFRAN ODT) 4 MG disintegrating tablet 4mg  ODT q4 hours prn nausea/vomit 01/06/15   Milton Ferguson, MD   BP 106/70 mmHg  Pulse 50  Temp(Src) 97.7  F (36.5 C) (Oral)  Resp 17  SpO2 95% Physical Exam  Constitutional: She is oriented to person, place, and time. She appears well-developed.  HENT:  Head: Normocephalic.  Eyes: Conjunctivae and EOM are normal. No scleral icterus.  Neck: Neck supple. No thyromegaly present.  Cardiovascular: Normal rate and regular rhythm.  Exam reveals no gallop and no friction rub.   No murmur heard. Pulmonary/Chest: No stridor. She has no wheezes. She has no rales. She exhibits no tenderness.  Abdominal: She exhibits no distension. There is no tenderness. There is no rebound.  Musculoskeletal:  Normal range of motion. She exhibits no edema.  Lymphadenopathy:    She has no cervical adenopathy.  Neurological: She is oriented to person, place, and time. She exhibits normal muscle tone. Coordination normal.  Skin: No rash noted. No erythema.  Psychiatric: She has a normal mood and affect. Her behavior is normal.    ED Course  Procedures (including critical care time) Labs Review Labs Reviewed  CBC WITH DIFFERENTIAL/PLATELET - Abnormal; Notable for the following:    Monocytes Relative 17 (*)    Monocytes Absolute 1.1 (*)    Basophils Relative 3 (*)    Basophils Absolute 0.2 (*)    All other components within normal limits  COMPREHENSIVE METABOLIC PANEL - Abnormal; Notable for the following:    BUN 28 (*)    Creatinine, Ser 1.21 (*)    Calcium 8.3 (*)    Albumin 2.0 (*)    AST 74 (*)    Alkaline Phosphatase 182 (*)    Total Bilirubin 1.6 (*)    GFR calc non Af Amer 48 (*)    GFR calc Af Amer 56 (*)    All other components within normal limits  URINALYSIS, ROUTINE W REFLEX MICROSCOPIC - Abnormal; Notable for the following:    APPearance HAZY (*)    Hgb urine dipstick LARGE (*)    Protein, ur 100 (*)    Nitrite POSITIVE (*)    Leukocytes, UA MODERATE (*)    All other components within normal limits  URINE MICROSCOPIC-ADD ON - Abnormal; Notable for the following:    Squamous Epithelial / LPF MANY (*)    Bacteria, UA MANY (*)    All other components within normal limits  URINE CULTURE  LIPASE, BLOOD    Imaging Review Dg Chest 2 View  01/06/2015   CLINICAL DATA:  Cough, congestion and fever intermittently for 3 months.  EXAM: CHEST  2 VIEW  COMPARISON:  PA and lateral chest 09/05/2012.  FINDINGS: The patient has a small right pleural effusion and basilar atelectasis. No left pleural effusion is noted. The left lung is clear. Heart size is normal. Surgical clips right axilla are noted.  IMPRESSION: Small left pleural effusion with an associated compressive atelectasis.    Electronically Signed   By: Inge Rise M.D.   On: 01/06/2015 12:16     EKG Interpretation   Date/Time:  Wednesday January 06 2015 11:05:18 EST Ventricular Rate:  48 PR Interval:  165 QRS Duration: 105 QT Interval:  521 QTC Calculation: 465 R Axis:   35 Text Interpretation:  Sinus bradycardia LVH with secondary repolarization  abnormality Confirmed by Rumor Sun  MD, Jamarkus Lisbon [phone]) on 01/06/2015 1:46:08 PM      MDM   Final diagnoses:  UTI (lower urinary tract infection)  Bronchitis    Bronchitis, dehydration and uti,   rx with levaquin, zofran and follow up     Milton Ferguson, MD 01/06/15 [phone]

## 2015-01-09 LAB — URINE CULTURE

## 2015-01-11 ENCOUNTER — Telehealth: Payer: Self-pay | Admitting: *Deleted

## 2015-01-11 NOTE — ED Notes (Signed)
(+)  urine culture, treated with Levofloxacin, OK per pharm

## 2015-02-11 DIAGNOSIS — K769 Liver disease, unspecified: Secondary | ICD-10-CM | POA: Diagnosis not present

## 2015-02-11 DIAGNOSIS — F329 Major depressive disorder, single episode, unspecified: Secondary | ICD-10-CM | POA: Diagnosis not present

## 2015-02-11 DIAGNOSIS — F1010 Alcohol abuse, uncomplicated: Secondary | ICD-10-CM | POA: Diagnosis not present

## 2015-02-11 DIAGNOSIS — J40 Bronchitis, not specified as acute or chronic: Secondary | ICD-10-CM | POA: Diagnosis not present

## 2015-04-21 ENCOUNTER — Inpatient Hospital Stay (HOSPITAL_COMMUNITY): Payer: Commercial Managed Care - HMO

## 2015-04-21 ENCOUNTER — Emergency Department (HOSPITAL_COMMUNITY): Payer: Commercial Managed Care - HMO

## 2015-04-21 ENCOUNTER — Inpatient Hospital Stay (HOSPITAL_COMMUNITY)
Admission: EM | Admit: 2015-04-21 | Discharge: 2015-04-29 | DRG: 377 | Disposition: A | Discharge status: Home / Self Care | Payer: Commercial Managed Care - HMO | Attending: Internal Medicine | Admitting: Internal Medicine

## 2015-04-21 ENCOUNTER — Encounter (HOSPITAL_COMMUNITY): Payer: Self-pay | Admitting: Emergency Medicine

## 2015-04-21 DIAGNOSIS — K922 Gastrointestinal hemorrhage, unspecified: Secondary | ICD-10-CM | POA: Diagnosis present

## 2015-04-21 DIAGNOSIS — I8500 Esophageal varices without bleeding: Secondary | ICD-10-CM | POA: Diagnosis present

## 2015-04-21 DIAGNOSIS — K625 Hemorrhage of anus and rectum: Secondary | ICD-10-CM | POA: Diagnosis present

## 2015-04-21 DIAGNOSIS — R1032 Left lower quadrant pain: Secondary | ICD-10-CM | POA: Diagnosis present

## 2015-04-21 DIAGNOSIS — K920 Hematemesis: Secondary | ICD-10-CM | POA: Diagnosis not present

## 2015-04-21 DIAGNOSIS — I10 Essential (primary) hypertension: Secondary | ICD-10-CM | POA: Diagnosis present

## 2015-04-21 DIAGNOSIS — G9341 Metabolic encephalopathy: Secondary | ICD-10-CM | POA: Diagnosis not present

## 2015-04-21 DIAGNOSIS — K7290 Hepatic failure, unspecified without coma: Secondary | ICD-10-CM | POA: Diagnosis present

## 2015-04-21 DIAGNOSIS — J9690 Respiratory failure, unspecified, unspecified whether with hypoxia or hypercapnia: Secondary | ICD-10-CM | POA: Diagnosis not present

## 2015-04-21 DIAGNOSIS — K7030 Alcoholic cirrhosis of liver without ascites: Secondary | ICD-10-CM | POA: Diagnosis present

## 2015-04-21 DIAGNOSIS — E8809 Other disorders of plasma-protein metabolism, not elsewhere classified: Secondary | ICD-10-CM | POA: Diagnosis present

## 2015-04-21 DIAGNOSIS — M549 Dorsalgia, unspecified: Secondary | ICD-10-CM

## 2015-04-21 DIAGNOSIS — R05 Cough: Secondary | ICD-10-CM | POA: Diagnosis not present

## 2015-04-21 DIAGNOSIS — J9601 Acute respiratory failure with hypoxia: Secondary | ICD-10-CM | POA: Diagnosis present

## 2015-04-21 DIAGNOSIS — Z9049 Acquired absence of other specified parts of digestive tract: Secondary | ICD-10-CM | POA: Diagnosis present

## 2015-04-21 DIAGNOSIS — G971 Other reaction to spinal and lumbar puncture: Secondary | ICD-10-CM

## 2015-04-21 DIAGNOSIS — E8770 Fluid overload, unspecified: Secondary | ICD-10-CM | POA: Diagnosis present

## 2015-04-21 DIAGNOSIS — K7460 Unspecified cirrhosis of liver: Secondary | ICD-10-CM | POA: Diagnosis not present

## 2015-04-21 DIAGNOSIS — D259 Leiomyoma of uterus, unspecified: Secondary | ICD-10-CM | POA: Diagnosis not present

## 2015-04-21 DIAGNOSIS — G4730 Sleep apnea, unspecified: Secondary | ICD-10-CM | POA: Diagnosis present

## 2015-04-21 DIAGNOSIS — D649 Anemia, unspecified: Secondary | ICD-10-CM | POA: Diagnosis present

## 2015-04-21 DIAGNOSIS — R091 Pleurisy: Secondary | ICD-10-CM | POA: Diagnosis not present

## 2015-04-21 DIAGNOSIS — J90 Pleural effusion, not elsewhere classified: Secondary | ICD-10-CM | POA: Diagnosis not present

## 2015-04-21 DIAGNOSIS — E119 Type 2 diabetes mellitus without complications: Secondary | ICD-10-CM | POA: Diagnosis present

## 2015-04-21 DIAGNOSIS — Z9889 Other specified postprocedural states: Secondary | ICD-10-CM

## 2015-04-21 DIAGNOSIS — G8929 Other chronic pain: Secondary | ICD-10-CM | POA: Diagnosis present

## 2015-04-21 DIAGNOSIS — J9691 Respiratory failure, unspecified with hypoxia: Secondary | ICD-10-CM | POA: Diagnosis not present

## 2015-04-21 DIAGNOSIS — R945 Abnormal results of liver function studies: Secondary | ICD-10-CM | POA: Diagnosis not present

## 2015-04-21 DIAGNOSIS — K7011 Alcoholic hepatitis with ascites: Secondary | ICD-10-CM | POA: Diagnosis not present

## 2015-04-21 DIAGNOSIS — K7031 Alcoholic cirrhosis of liver with ascites: Secondary | ICD-10-CM | POA: Diagnosis not present

## 2015-04-21 DIAGNOSIS — E669 Obesity, unspecified: Secondary | ICD-10-CM | POA: Diagnosis present

## 2015-04-21 DIAGNOSIS — F1010 Alcohol abuse, uncomplicated: Secondary | ICD-10-CM | POA: Diagnosis present

## 2015-04-21 DIAGNOSIS — D6959 Other secondary thrombocytopenia: Secondary | ICD-10-CM | POA: Diagnosis present

## 2015-04-21 DIAGNOSIS — K2971 Gastritis, unspecified, with bleeding: Secondary | ICD-10-CM | POA: Diagnosis not present

## 2015-04-21 DIAGNOSIS — G9340 Encephalopathy, unspecified: Secondary | ICD-10-CM | POA: Diagnosis not present

## 2015-04-21 DIAGNOSIS — K3189 Other diseases of stomach and duodenum: Secondary | ICD-10-CM | POA: Diagnosis not present

## 2015-04-21 DIAGNOSIS — Z8719 Personal history of other diseases of the digestive system: Secondary | ICD-10-CM

## 2015-04-21 DIAGNOSIS — R188 Other ascites: Secondary | ICD-10-CM | POA: Diagnosis present

## 2015-04-21 DIAGNOSIS — R0600 Dyspnea, unspecified: Secondary | ICD-10-CM

## 2015-04-21 DIAGNOSIS — R600 Localized edema: Secondary | ICD-10-CM | POA: Diagnosis present

## 2015-04-21 DIAGNOSIS — J9811 Atelectasis: Secondary | ICD-10-CM | POA: Diagnosis not present

## 2015-04-21 DIAGNOSIS — F22 Delusional disorders: Secondary | ICD-10-CM

## 2015-04-21 HISTORY — DX: Hepatomegaly, not elsewhere classified: R16.0

## 2015-04-21 HISTORY — DX: Localized edema: R60.0

## 2015-04-21 HISTORY — DX: Diverticulosis of intestine, part unspecified, without perforation or abscess without bleeding: K57.90

## 2015-04-21 HISTORY — DX: Alcohol abuse, uncomplicated: F10.10

## 2015-04-21 LAB — COMPREHENSIVE METABOLIC PANEL
ALT: 24 U/L (ref 14–54)
AST: 79 U/L — ABNORMAL HIGH (ref 15–41)
Albumin: 1.9 g/dL — ABNORMAL LOW (ref 3.5–5.0)
Alkaline Phosphatase: 188 U/L — ABNORMAL HIGH (ref 38–126)
Anion gap: 10 (ref 5–15)
BUN: 37 mg/dL — ABNORMAL HIGH (ref 6–20)
CO2: 18 mmol/L — ABNORMAL LOW (ref 22–32)
Calcium: 7.4 mg/dL — ABNORMAL LOW (ref 8.9–10.3)
Chloride: 111 mmol/L (ref 101–111)
Creatinine, Ser: 1.03 mg/dL — ABNORMAL HIGH (ref 0.44–1.00)
GFR calc Af Amer: >60 mL/min (ref >60)
GFR calc non Af Amer: 58 mL/min — ABNORMAL LOW (ref >60)
Glucose, Bld: 94 mg/dL (ref 65–99)
Potassium: 4.4 mmol/L (ref 3.5–5.1)
Sodium: 139 mmol/L (ref 135–145)
Total Bilirubin: 1.6 mg/dL — ABNORMAL HIGH (ref 0.3–1.2)
Total Protein: 5.8 g/dL — ABNORMAL LOW (ref 6.5–8.1)

## 2015-04-21 LAB — CBC
HEMATOCRIT: 36.7 % (ref 36.0–46.0)
Hemoglobin: 12.1 g/dL (ref 12.0–15.0)
MCH: 29.5 pg (ref 26.0–34.0)
MCHC: 33 g/dL (ref 30.0–36.0)
MCV: 89.5 fL (ref 78.0–100.0)
Platelets: 147 10*3/uL — ABNORMAL LOW (ref 150–400)
RBC: 4.1 MIL/uL (ref 3.87–5.11)
RDW: 15.1 % (ref 11.5–15.5)
WBC: 7.5 10*3/uL (ref 4.0–10.5)

## 2015-04-21 LAB — TYPE AND SCREEN
ABO/RH(D): O POS
Antibody Screen: NEGATIVE

## 2015-04-21 LAB — CBC WITH DIFFERENTIAL/PLATELET
Basophils Absolute: 0.1 10*3/uL (ref 0.0–0.1)
Basophils Relative: 3 % — ABNORMAL HIGH (ref 0–1)
Eosinophils Absolute: 0.1 10*3/uL (ref 0.0–0.7)
Eosinophils Relative: 2 % (ref 0–5)
HCT: 35.4 % — ABNORMAL LOW (ref 36.0–46.0)
Hemoglobin: 11.9 g/dL — ABNORMAL LOW (ref 12.0–15.0)
Lymphocytes Relative: 23 % (ref 12–46)
Lymphs Abs: 1.3 10*3/uL (ref 0.7–4.0)
MCH: 29.5 pg (ref 26.0–34.0)
MCHC: 33.6 g/dL (ref 30.0–36.0)
MCV: 87.6 fL (ref 78.0–100.0)
Monocytes Absolute: 0.8 10*3/uL (ref 0.1–1.0)
Monocytes Relative: 14 % — ABNORMAL HIGH (ref 3–12)
Neutro Abs: 3.3 10*3/uL (ref 1.7–7.7)
Neutrophils Relative %: 58 % (ref 43–77)
Platelets: 124 10*3/uL — ABNORMAL LOW (ref 150–400)
RBC: 4.04 MIL/uL (ref 3.87–5.11)
RDW: 14.8 % (ref 11.5–15.5)
WBC: 5.7 10*3/uL (ref 4.0–10.5)

## 2015-04-21 LAB — LIPASE, BLOOD: Lipase: 21 U/L — ABNORMAL LOW (ref 22–51)

## 2015-04-21 LAB — PROTIME-INR
INR: 1.28 (ref 0.00–1.49)
Prothrombin Time: 16.2 seconds — ABNORMAL HIGH (ref 11.6–15.2)

## 2015-04-21 LAB — BODY FLUID CELL COUNT WITH DIFFERENTIAL
Eos, Fluid: 0 %
LYMPHS FL: 33 %
Monocyte-Macrophage-Serous Fluid: 56 % (ref 50–90)
Neutrophil Count, Fluid: 11 % (ref 0–25)
OTHER CELLS FL: 0 %
Total Nucleated Cell Count, Fluid: 145 cu mm (ref 0–1000)

## 2015-04-21 LAB — AMMONIA: AMMONIA: 57 umol/L — AB (ref 9–35)

## 2015-04-21 LAB — MRSA PCR SCREENING: MRSA BY PCR: NEGATIVE

## 2015-04-21 LAB — LACTATE DEHYDROGENASE, PLEURAL OR PERITONEAL FLUID: LD, Fluid: 53 U/L — ABNORMAL HIGH (ref 3–23)

## 2015-04-21 MED ORDER — PANTOPRAZOLE SODIUM 40 MG IV SOLR
80.0000 mg | Freq: Once | INTRAVENOUS | Status: AC
  Administered 2015-04-21: 80 mg via INTRAVENOUS
  Filled 2015-04-21: qty 80

## 2015-04-21 MED ORDER — SODIUM CHLORIDE 0.9 % IV SOLN
INTRAVENOUS | Status: DC
  Administered 2015-04-21: 10:00:00 via INTRAVENOUS

## 2015-04-21 MED ORDER — SODIUM CHLORIDE 0.9 % IJ SOLN: 3.0000 mL | INTRAMUSCULAR | Status: DC | PRN

## 2015-04-21 MED ORDER — IOHEXOL 300 MG/ML  SOLN
25.0000 mL | Freq: Once | INTRAMUSCULAR | Status: AC | PRN
  Administered 2015-04-21: 25 mL via ORAL

## 2015-04-21 MED ORDER — SODIUM CHLORIDE 0.9 % IJ SOLN
3.0000 mL | Freq: Two times a day (BID) | INTRAMUSCULAR | Status: DC
  Administered 2015-04-21 – 2015-04-28 (×12): 3 mL via INTRAVENOUS

## 2015-04-21 MED ORDER — IOHEXOL 300 MG/ML  SOLN
100.0000 mL | Freq: Once | INTRAMUSCULAR | Status: AC | PRN
  Administered 2015-04-21: 100 mL via INTRAVENOUS

## 2015-04-21 MED ORDER — LORAZEPAM 0.5 MG PO TABS
1.0000 mg | ORAL_TABLET | Freq: Four times a day (QID) | ORAL | Status: AC | PRN
  Administered 2015-04-24: 1 mg via ORAL
  Filled 2015-04-21: qty 2

## 2015-04-21 MED ORDER — PANTOPRAZOLE SODIUM 40 MG IV SOLR: 40.0000 mg | Freq: Once | INTRAVENOUS | Status: DC

## 2015-04-21 MED ORDER — FUROSEMIDE 10 MG/ML IJ SOLN: 40.0000 mg | Freq: Every day | INTRAMUSCULAR | Status: DC

## 2015-04-21 MED ORDER — METOPROLOL TARTRATE 1 MG/ML IV SOLN
5.0000 mg | Freq: Four times a day (QID) | INTRAVENOUS | Status: DC
  Administered 2015-04-21 – 2015-04-27 (×22): 5 mg via INTRAVENOUS
  Filled 2015-04-21 (×23): qty 5

## 2015-04-21 MED ORDER — PROMETHAZINE HCL 25 MG/ML IJ SOLN
12.5000 mg | Freq: Once | INTRAMUSCULAR | Status: AC
  Administered 2015-04-21: 12.5 mg via INTRAVENOUS
  Filled 2015-04-21: qty 1

## 2015-04-21 MED ORDER — ALBUMIN HUMAN 25 % IV SOLN
INTRAVENOUS | Status: AC
  Filled 2015-04-21: qty 50

## 2015-04-21 MED ORDER — ADULT MULTIVITAMIN W/MINERALS CH
1.0000 | ORAL_TABLET | Freq: Every day | ORAL | Status: DC
  Administered 2015-04-22 – 2015-04-29 (×8): 1 via ORAL
  Filled 2015-04-21 (×8): qty 1

## 2015-04-21 MED ORDER — LORAZEPAM 2 MG/ML IJ SOLN
0.0000 mg | Freq: Two times a day (BID) | INTRAMUSCULAR | Status: AC
  Administered 2015-04-23: 2 mg via INTRAVENOUS

## 2015-04-21 MED ORDER — LORAZEPAM 2 MG/ML IJ SOLN
1.0000 mg | Freq: Four times a day (QID) | INTRAMUSCULAR | Status: AC | PRN
  Administered 2015-04-22 – 2015-04-24 (×4): 1 mg via INTRAVENOUS
  Filled 2015-04-21 (×3): qty 1

## 2015-04-21 MED ORDER — TRAZODONE HCL 50 MG PO TABS
25.0000 mg | ORAL_TABLET | Freq: Every evening | ORAL | Status: DC | PRN
  Administered 2015-04-22 – 2015-04-26 (×2): 25 mg via ORAL
  Filled 2015-04-21 (×2): qty 1

## 2015-04-21 MED ORDER — LORAZEPAM 2 MG/ML IJ SOLN
0.0000 mg | Freq: Four times a day (QID) | INTRAMUSCULAR | Status: AC
  Administered 2015-04-22: 2 mg via INTRAVENOUS
  Administered 2015-04-22: 4 mg via INTRAVENOUS
  Administered 2015-04-23: 2 mg via INTRAVENOUS
  Administered 2015-04-23: 4 mg via INTRAVENOUS
  Filled 2015-04-21 (×3): qty 1
  Filled 2015-04-21 (×2): qty 2
  Filled 2015-04-21: qty 1

## 2015-04-21 MED ORDER — CIPROFLOXACIN IN D5W 400 MG/200ML IV SOLN
400.0000 mg | Freq: Two times a day (BID) | INTRAVENOUS | Status: DC
  Administered 2015-04-21 – 2015-04-26 (×10): 400 mg via INTRAVENOUS
  Filled 2015-04-21 (×10): qty 200

## 2015-04-21 MED ORDER — VITAMIN B-1 100 MG PO TABS
100.0000 mg | ORAL_TABLET | Freq: Every day | ORAL | Status: DC
  Administered 2015-04-22 – 2015-04-29 (×8): 100 mg via ORAL
  Filled 2015-04-21 (×8): qty 1

## 2015-04-21 MED ORDER — PANTOPRAZOLE SODIUM 40 MG IV SOLR
40.0000 mg | Freq: Two times a day (BID) | INTRAVENOUS | Status: DC
  Administered 2015-04-21 – 2015-04-26 (×10): 40 mg via INTRAVENOUS
  Filled 2015-04-21 (×10): qty 40

## 2015-04-21 MED ORDER — FUROSEMIDE 10 MG/ML IJ SOLN
40.0000 mg | INTRAMUSCULAR | Status: DC
  Administered 2015-04-21 – 2015-04-22 (×2): 40 mg via INTRAVENOUS
  Filled 2015-04-21 (×2): qty 4

## 2015-04-21 MED ORDER — CETYLPYRIDINIUM CHLORIDE 0.05 % MT LIQD
7.0000 mL | Freq: Two times a day (BID) | OROMUCOSAL | Status: DC
  Administered 2015-04-21 – 2015-04-29 (×15): 7 mL via OROMUCOSAL

## 2015-04-21 MED ORDER — ONDANSETRON HCL 4 MG/2ML IJ SOLN
4.0000 mg | Freq: Four times a day (QID) | INTRAMUSCULAR | Status: DC | PRN
  Administered 2015-04-23: 4 mg via INTRAVENOUS
  Filled 2015-04-21: qty 2

## 2015-04-21 MED ORDER — HYDROMORPHONE HCL 1 MG/ML IJ SOLN
1.0000 mg | Freq: Once | INTRAMUSCULAR | Status: AC
  Administered 2015-04-21: 1 mg via INTRAVENOUS
  Filled 2015-04-21: qty 1

## 2015-04-21 MED ORDER — MORPHINE SULFATE 2 MG/ML IJ SOLN
1.0000 mg | INTRAMUSCULAR | Status: DC | PRN
  Administered 2015-04-23: 1 mg via INTRAVENOUS
  Filled 2015-04-21: qty 1

## 2015-04-21 MED ORDER — ALBUMIN HUMAN 25 % IV SOLN
50.0000 g | INTRAVENOUS | Status: AC
Start: 2015-04-21 — End: 2015-04-23
  Administered 2015-04-21 – 2015-04-23 (×3): 50 g via INTRAVENOUS
  Filled 2015-04-21 (×3): qty 200

## 2015-04-21 MED ORDER — ONDANSETRON HCL 4 MG/2ML IJ SOLN
4.0000 mg | Freq: Once | INTRAMUSCULAR | Status: AC
  Administered 2015-04-21: 4 mg via INTRAVENOUS
  Filled 2015-04-21: qty 2

## 2015-04-21 MED ORDER — SODIUM CHLORIDE 0.9 % IV SOLN
250.0000 mL | INTRAVENOUS | Status: DC | PRN
  Administered 2015-04-21: 250 mL via INTRAVENOUS

## 2015-04-21 MED ORDER — ONDANSETRON HCL 4 MG PO TABS: 4.0000 mg | ORAL_TABLET | Freq: Four times a day (QID) | ORAL | Status: DC | PRN

## 2015-04-21 MED ORDER — THIAMINE HCL 100 MG/ML IJ SOLN: 100.0000 mg | Freq: Every day | INTRAMUSCULAR | Status: DC

## 2015-04-21 MED ORDER — FOLIC ACID 1 MG PO TABS
1.0000 mg | ORAL_TABLET | Freq: Every day | ORAL | Status: DC
  Administered 2015-04-22 – 2015-04-29 (×8): 1 mg via ORAL
  Filled 2015-04-21 (×8): qty 1

## 2015-04-21 MED ORDER — ALBUMIN HUMAN 25 % IV SOLN
INTRAVENOUS | Status: AC
  Filled 2015-04-21: qty 150

## 2015-04-21 NOTE — Consult Note (Addendum)
Referring Provider: No ref. provider found Primary Care Physician:  Rosita Fire, MD Primary Gastroenterologist:  Dr. Laural Golden  Reason for Consultation:    Hematemesis and rectal bleeding in a patient with history of alcoholic liver disease.  HPI:   Patient is 59 year old African-American female with history of alcoholic cirrhosis who was admitted earlier today with nausea vomiting coffee-ground emesis as well as rectal bleeding. Patient was noted to have large right pleural effusion as well as ascites. She underwent thoracenteses by Dr. Lavonia Dana with removal of 1.7 L of fluid. Patient has continued to drink aloe mainly in the form of be a on regular basis despite being aware of the diagnosis. Patient states she lost her mother 3 weeks ago and since then she's been drinking beer as well as vodka. In the last few weeks she has had 4-5 episodes of coffee-ground emesis. Last episode was yesterday. She also has noted abdominal distention cough and weakness. She has had poor appetite and insomnia. Yesterday she passed fresh blood and clots per rectum. She hasn't had any emesis today. She has been nauseated. She denies using OTC NSAIDs. She states she hasn't felt well for 4 years. She is not sure if she has lost any weight recently. She's been drinking pints of vodka every 5 days. She has not had any alcohol in 2 days. She underwent colonoscopy in June 2014 for heme-positive stools. She had 4 polyps removed and these are tubular adenomas. She also was noted to have small external hemorrhoids. She has never been screened for esophageal varices. 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 She's been on disability for 10 years. She lives at home with her husband was bilateral amputation. They have 1 grown up daughter.   Past Medical History  Diagnosis Date  . Hypertension   . Brain aneurysm   . Cancer   . Rotator cuff syndrome of left shoulder   . DDD (degenerative disc disease), lumbar   . Chronic  back pain Diverticultis  . Cirrhosis     alcoholic  . Sleep apnea   . ETOH abuse   . Bilateral leg edema   . Hepatomegaly     scope 2014  . Diverticulosis     scope 2014    Past Surgical History  Procedure Laterality Date  . Cholecystectomy    . Brain surgery    . Breast surgery    . Total knee arthroplasty      right. 2002  . Cataract extraction Bilateral     [facility] 2 or 3 years ago  . Colonoscopy N/A 04/10/2013    Procedure: COLONOSCOPY;  Surgeon: Rogene Houston, MD;  Location: [facility];  Service: Endoscopy;  Laterality: N/A;  1030-rescheduled to 1200 Ann notified pt    Prior to Admission medications   Medication Sig Start Date End Date Taking? Authorizing Provider  diphenhydramine-acetaminophen (TYLENOL PM) 25-500 MG TABS Take 1 tablet by mouth at bedtime as needed (headache).    Yes Historical Provider, MD  escitalopram (LEXAPRO) 10 MG tablet Take 1 tablet by mouth daily. 02/11/15  Yes Historical Provider, MD  furosemide (LASIX) 40 MG tablet Take 1 tablet by mouth daily. 03/31/15  Yes Historical Provider, MD  guaiFENesin (MUCINEX) 600 MG 12 hr tablet Take 600 mg by mouth 2 (two) times daily as needed for cough or to loosen phlegm.    Yes Historical Provider, MD  lisinopril (PRINIVIL,ZESTRIL) 20 MG tablet Take 1 tablet (20 mg total) by mouth daily. 07/18/12  Yes Rosita Fire, MD  metoprolol (LOPRESSOR) 50 MG tablet Take 50 mg by mouth 2 (two) times daily.   Yes Historical Provider, MD  potassium chloride 20 MEQ TBCR Take 20 mEq by mouth 2 (two) times daily. 10/25/13  Yes [name], MD  spironolactone (ALDACTONE) 25 MG tablet Take 1 tablet by mouth daily. 03/29/15  Yes Historical Provider, MD    Current Facility-Administered Medications  Medication Dose Route Frequency Provider Last Rate Last Dose  . 0.9 %  sodium chloride infusion  250 mL Intravenous PRN Radene Gunning, NP 10 mL/hr at 04/21/15 1700 250 mL at 04/21/15 1700  . albumin human 25 % solution 50 g  50 g  Intravenous Daily Rogene Houston, MD      . ciprofloxacin (CIPRO) IVPB 400 mg  400 mg Intravenous Q12H Rogene Houston, MD      . furosemide (LASIX) injection 40 mg  40 mg Intravenous Daily Rogene Houston, MD      . metoprolol (LOPRESSOR) injection 5 mg  5 mg Intravenous 4 times per day Radene Gunning, NP      . morphine 2 MG/ML injection 1 mg  1 mg Intravenous Q3H PRN Radene Gunning, NP      . ondansetron [facility]) tablet 4 mg  4 mg Oral Q6H PRN Radene Gunning, NP       Or  . ondansetron [facility]) injection 4 mg  4 mg Intravenous Q6H PRN Radene Gunning, NP      . pantoprazole (PROTONIX) injection 40 mg  40 mg Intravenous Q12H Lezlie Octave Black, NP      . sodium chloride 0.9 % injection 3 mL  3 mL Intravenous Q12H Lezlie Octave Black, NP      . sodium chloride 0.9 % injection 3 mL  3 mL Intravenous PRN Radene Gunning, NP      . traZODone (DESYREL) tablet 25 mg  25 mg Oral QHS PRN Radene Gunning, NP        Allergies as of 04/21/2015 - Review Complete 04/21/2015  Allergen Reaction Noted  . Latex Swelling 07/15/2012  . Vicodin [hydrocodone-acetaminophen] Itching 09/06/2012    History reviewed. No pertinent family history.  History   Social History  . Marital Status: Married    Spouse Name: N/A  . Number of Children: N/A  . Years of Education: N/A   Occupational History  . Not on file.   Social History Main Topics  . Smoking status: Never Smoker   . Smokeless tobacco: Not on file  . Alcohol Use: Yes  . Drug Use: No  . Sexual Activity: Yes    Birth Control/ Protection: None   Other Topics Concern  . Not on file   Social History Narrative    Review of Systems: See HPI, otherwise normal ROS  Physical Exam: Temp:  [97.7 F (36.5 C)-98.2 F (36.8 C)] 98.2 F (36.8 C) (06/22 1556) Pulse Rate:  [88-122] 95 (06/22 1705) Resp:  [11-24] 12 (06/22 1705) BP: (98-152)/(78-117) 127/86 mmHg (06/22 1700) SpO2:  [87 %-100 %] 97 % (06/22 1705) Weight:  [176 lb 9.4 oz (80.1 kg)-178 lb (80.74 kg)]  176 lb 9.4 oz (80.1 kg) (06/22 1556) Last BM Date: 04/21/15 Patient is alert and in no acute distress. Patient does not have asterixis. Conjunctiva was pink. Sclerae nonicteric. Oropharyngeal mucosa is normal. Dentition in satisfactory condition. No neck masses or thyromegaly noted. Cardiac exam with regular rhythm normal S1 and S2. No murmur or  gallop noted. Auscultation of lungs reveals absent breath sounds at right base. Rest sounds are vesicular elsewhere. Abdomen is full. Bowel sounds are normal. On palpation abdomen is not tense or tight. No tenderness noted. Shifting dialysis present. Rectal examination deferred. No peripheral edema or clubbing noted.  Lab Results:  Recent Labs  04/21/15 0959 04/21/15 1600  WBC 5.7 7.5  HGB 11.9* 12.1  HCT 35.4* 36.7  PLT 124* 147*   BMET  Recent Labs  04/21/15 0959  NA 139  K 4.4  CL 111  CO2 18*  GLUCOSE 94  BUN 37*  CREATININE 1.03*  CALCIUM 7.4*   LFT  Recent Labs  04/21/15 0959  PROT 5.8*  ALBUMIN 1.9*  AST 79*  ALT 24  ALKPHOS 188*  BILITOT 1.6*   PT/INR  Recent Labs  04/21/15 0959  LABPROT 16.2*  INR 1.28    Studies/Results: Dg Chest 1 View  04/21/2015   CLINICAL DATA:  Post RIGHT thoracentesis  EXAM: CHEST  1 VIEW  COMPARISON:  Portable exam 1716 hours compared to 1317 hours  FINDINGS: Marked decrease in RIGHT pleural effusion post thoracentesis.  No pneumothorax.  Mild residual RIGHT basilar atelectasis and pleural effusion.  Cardiac silhouette appears enlarged with slight vascular congestion.  No pulmonary infiltrates.  IMPRESSION: Marked decrease in RIGHT pleural effusion and basilar atelectasis post thoracentesis without pneumothorax.   Electronically Signed   By: Lavonia Dana M.D.   On: 04/21/2015 17:33   Dg Chest 1 View  04/21/2015   CLINICAL DATA:  Rectal bleeding  EXAM: CHEST  1 VIEW  COMPARISON:  01/06/2015  FINDINGS: Right pleural effusion increased. There is now shift of the mediastinum to the  left. Left lung is grossly clear. Heart is normal in size. No pneumothorax.  IMPRESSION: Increasing right pleural effusion resulting in mediastinal shift to the left.   Electronically Signed   By: Marybelle Killings M.D.   On: 04/21/2015 13:42   Ct Abdomen Pelvis W Contrast  04/21/2015   CLINICAL DATA:  Severe generalized abdominal pain. Hematemesis and rectal bleeding. nausea and vomiting. Cirrhosis.  EXAM: CT ABDOMEN AND PELVIS WITH CONTRAST  TECHNIQUE: Multidetector CT imaging of the abdomen and pelvis was performed using the standard protocol following bolus administration of intravenous contrast.  CONTRAST:  175mL OMNIPAQUE IOHEXOL 300 MG/ML SOLN, 35mL OMNIPAQUE IOHEXOL 300 MG/ML SOLN  COMPARISON:  10/25/2013  FINDINGS: Lower Chest: New moderate to large right pleural effusion and right lower lobe atelectasis demonstrated.  Hepatobiliary: Hepatic cirrhosis again demonstrated, however no liver masses are identified. Prior cholecystectomy noted. No evidence of biliary dilatation.  Pancreas: No mass, inflammatory changes, or other significant abnormality identified.  Spleen:  Within normal limits in size and appearance.  Adrenals:  No masses identified.  Kidneys/Urinary Tract:  No evidence of masses or hydronephrosis.  Stomach/Bowel/Peritoneum: Moderate ascites as well as diffuse body wall and mesenteric edema shows no significant interval change. Diffuse colonic diverticulosis is seen, without evidence of focal diverticulitis. There is mild diffuse small bowel and colonic wall thickening, likely due to hypoalbuminemia in the setting of cirrhosis. No evidence of dilated bowel loops or bowel obstruction. No focal inflammatory process or abscess identified.  Vascular/Lymphatic: No pathologically enlarged lymph nodes identified. No abdominal aortic aneurysm or other significant retroperitoneal abnormality demonstrated. Portal and splenic veins remain patent.  Reproductive: Multiple uterine fibroids again seen, largest  measuring approximately 4 cm. Adnexal regions are unremarkable.  Other:  None.  Musculoskeletal:  No suspicious bone lesions identified.  IMPRESSION:  Hepatic cirrhosis with moderate ascites and diffuse mesenteric and body wall edema, without significant change compared to prior exam. No evidence of hepatic neoplasm.  Mild diffuse small bowel and colonic wall thickening, likely due to hypoalbuminemia. Diverticulosis noted, without evidence of diverticulitis or other focal inflammatory process.  New moderate to large right pleural effusion and right lower lung atelectasis.  Uterine fibroids, largest measuring approximately 4 cm.   Electronically Signed   By: Earle Gell M.D.   On: 04/21/2015 13:36   US Thoracentesis Asp Pleural Space W/img Guide  04/21/2015   CLINICAL DATA:  RIGHT pleural effusion, ascites, cirrhosis  EXAM: ULTRASOUND GUIDED DIAGNOSTIC AND THERAPEUTIC THORACENTESIS  COMPARISON:  None  PROCEDURE: Procedure, benefits, and risks of procedure were discussed with patient.  Written informed consent for procedure was obtained.  Time out protocol followed.  Pleural effusion localized by ultrasound at the posterior RIGHT hemithorax.  Skin prepped and draped in usual sterile fashion.  Skin and soft tissues anesthetized with 10 mL of 1% lidocaine.  8 French thoracentesis catheter placed into the RIGHT pleural space.  1700 mL of clear light yellow fluid aspirated by syringe pump.  Procedure tolerated well by patient without immediate complication.  COMPLICATIONS: None  FINDINGS: A total of approximately 1700 mL of RIGHT pleural fluid was removed. A fluid sample of 180 mL was sent for laboratory analysis.  IMPRESSION: Successful ultrasound guided RIGHT thoracentesis yielding 1700 mL of pleural fluid.   Electronically Signed   By: Lavonia Dana M.D.   On: 04/21/2015 17:18    Assessment; Patient has multiple GI issues which are as follows. #1. Upper GI bleed. Upper GI bleed appears to be trivial. Differential  diagnosis includes alcoholic gastritis peptic ulcer disease or Mallory-Weiss tear. Her H&H is near normal. I doubt that via dealing with variceal bleed. Upper GI tract will be evaluated during this hospitalization. #2. Rectal bleeding. Rectal bleeding possibly secondary to hemorrhoids. Last colonoscopy was in June 2014 with removal of 4 tubular adenomas. Will consider colonoscopy at a later date. #3. Ascites. Ascites is secondary to alcoholic cirrhosis. She is afebrile. Therefore doubt that she has SBP. Fluid analysis on pleural fluid would be helpful. 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 #4. Transaminases are elevated with a pattern consistent with alcoholic hepatitis. Hepatic discriminant function is 20.9. Therefore there is no indication for steroid therapy.  Recommendations;  Cipro 400 mg IV every 12 hours. Albumin 50 g IV daily 3 days followed by furosemide 40 mg daily. Diagnostic esophagogastroduodenoscopy. Patient will be reevaluated in a.m. to determine if she needs abdominal paracentesis. Repeat lab in a.m.   LOS: 0 days   Takelia Urieta U  04/21/2015, 5:37 PM

## 2015-04-21 NOTE — Procedures (Signed)
PreOperative Dx: RIGHT pleural effusion Postoperative Dx: RIGHT pleural effusion Procedure:   US guided RIGHT thoracentesis Radiologist:  Thornton Papas Anesthesia:  10 ml of 1% lidocaine Specimen:  1700 ml of clear yellow colored fluid EBL:   < 1 ml Complications: None

## 2015-04-21 NOTE — H&P (Signed)
[facility] History and Physical  CELE MOTE UMV:[id number] DOB: [birth year] DOA: 04/21/2015  Referring physician:  PCP: Avon Gully, MD   Chief Complaint: BRBPR  HPI: Savannah Jackson is a 59 y.o. female past medical history that includes hypertension, alcohol-related cirrhosis, sleep apnea, diabetes, chronic back pain, diverticulosis percent emergency department with chief complaint bright red blood per rectum. Initial evaluation in the emergency department concerning for GI bleed and worsening liver disease as well as mild hypoxemia and new right pleural effusion. She reports bright red blood per rectum that started 1 day ago. Associated symptoms include worsening lower back pain, hematemesis worsening lower extremity edema and shortness of breath. She reports at least 10 episodes of bright red blood per rectum and noted some maroon clots. He denies fever chills cough chest pain. She does report frequent Advil use. She denies chest pain palpitation headache. She denies dysuria hematuria frequency or urgency. Workup in the emergency department includes complete blood count significant for hemoglobin of 11.9, platelets 147. X-ray with increasing right pleural effusion resulting in mediastinal shift to the left, CT abdomen pelvis reveals hepatic cirrhosis with moderate ascites, diverticulosis. In the emergency department she's hemodynamically stable but with mild hypoxemia that improves with oxygen    Review of Systems:  10 point review of systems complete and all systems are negative except as indicated in the history of present illness Past Medical History  Diagnosis Date  . Hypertension   . Brain aneurysm   . Cancer   . Rotator cuff syndrome of left shoulder   . DDD (degenerative disc disease), lumbar   . Chronic back pain Diverticultis  . Cirrhosis     alcoholic  . Sleep apnea   . ETOH abuse   . Bilateral leg edema   . Hepatomegaly     scope 2014  . Diverticulosis      scope 2014   Past Surgical History  Procedure Laterality Date  . Cholecystectomy    . Brain surgery    . Breast surgery    . Total knee arthroplasty      right. 2002  . Cataract extraction Bilateral     [facility] 2 or 3 years ago  . Colonoscopy N/A 04/10/2013    Procedure: COLONOSCOPY;  Surgeon: Malissa Hippo, MD;  Location: [facility];  Service: Endoscopy;  Laterality: N/A;  1030-rescheduled to 1200 Ann notified pt   Social History:  reports that she has never smoked. She does not have any smokeless tobacco history on file. She reports that she drinks alcohol. She reports that she does not use illicit drugs. She is disabled she lives at home with her husband who is disabled and he is primary caretaker Allergies  Allergen Reactions  . Latex Swelling    Discoloration of skin.   . Vicodin [Hydrocodone-Acetaminophen] Itching    History reviewed. No pertinent family history. mother deceased 3 weeks ago  Prior to Admission medications   Medication Sig Start Date End Date Taking? Authorizing Provider  diphenhydramine-acetaminophen (TYLENOL PM) 25-500 MG TABS Take 1 tablet by mouth at bedtime as needed (headache).    Yes Historical Provider, MD  escitalopram (LEXAPRO) 10 MG tablet Take 1 tablet by mouth daily. 02/11/15  Yes Historical Provider, MD  furosemide (LASIX) 40 MG tablet Take 1 tablet by mouth daily. 03/31/15  Yes Historical Provider, MD  guaiFENesin (MUCINEX) 600 MG 12 hr tablet Take 600 mg by mouth 2 (two) times daily as needed for cough or to loosen phlegm.  Yes Historical Provider, MD  lisinopril (PRINIVIL,ZESTRIL) 20 MG tablet Take 1 tablet (20 mg total) by mouth daily. 07/18/12  Yes Rosita Fire, MD  metoprolol (LOPRESSOR) 50 MG tablet Take 50 mg by mouth 2 (two) times daily.   Yes Historical Provider, MD  potassium chloride 20 MEQ TBCR Take 20 mEq by mouth 2 (two) times daily. 10/25/13  Yes [name], MD  spironolactone (ALDACTONE) 25 MG tablet Take 1 tablet by mouth  daily. 03/29/15  Yes Historical Provider, MD   Physical Exam: Filed Vitals:   04/21/15 1330 04/21/15 1427 04/21/15 1529 04/21/15 1556  BP: 121/94 134/86 130/78 142/104  Pulse: 101 102 88 95  Temp:    98.2 F (36.8 C)  TempSrc:    Oral  Resp:  18 20   Height:    '5\' 3"'$  (1.6 m)  Weight:    80.1 kg (176 lb 9.4 oz)  SpO2: 93% 95% 98% 97%    Wt Readings from Last 3 Encounters:  04/21/15 80.1 kg (176 lb 9.4 oz)  10/25/13 73.029 kg (161 lb)  04/10/13 72.576 kg (160 lb)    General:  Appears calm and lightly uncomfortable Eyes: PERRL, normal lids, irises & conjunctiva ENT: grossly normal hearing, lips & tongue, m his membranes of her mouth are pink slightly dry Neck: no LAD, masses or thyromegaly Cardiovascular: RRR, no m/r/g. 1+. Lower extremity edema  Respiratory: Mild increased work of breathing with conversation. Breath sounds clear. No crackles or wheezes Abdomen: Slightly distended and slightly tense positive bowel sounds positive fluid wave diffuse tenderness throughout Skin: no rash or induration seen on limited exam Musculoskeletal: grossly normal tone BUE/BLE Psychiatric: grossly normal mood and affect, speech fluent and appropriate Neurologic: grossly non-focal.          Labs on Admission:  Basic Metabolic Panel:  Recent Labs Lab 04/21/15 0959  NA 139  K 4.4  CL 111  CO2 18*  GLUCOSE 94  BUN 37*  CREATININE 1.03*  CALCIUM 7.4*   Liver Function Tests:  Recent Labs Lab 04/21/15 0959  AST 79*  ALT 24  ALKPHOS 188*  BILITOT 1.6*  PROT 5.8*  ALBUMIN 1.9*    Recent Labs Lab 04/21/15 0959  LIPASE 21*   No results for input(s): AMMONIA in the last 168 hours. CBC:  Recent Labs Lab 04/21/15 0959 04/21/15 1600  WBC 5.7 7.5  NEUTROABS 3.3  --   HGB 11.9* 12.1  HCT 35.4* 36.7  MCV 87.6 89.5  PLT 124* 147*   Cardiac Enzymes: No results for input(s): CKTOTAL, CKMB, CKMBINDEX, TROPONINI in the last 168 hours.  BNP (last 3 results) No results for  input(s): BNP in the last 8760 hours.  ProBNP (last 3 results) No results for input(s): PROBNP in the last 8760 hours.  CBG: No results for input(s): GLUCAP in the last 168 hours.  Radiological Exams on Admission: Dg Chest 1 View  04/21/2015   CLINICAL DATA:  Rectal bleeding  EXAM: CHEST  1 VIEW  COMPARISON:  01/06/2015  FINDINGS: Right pleural effusion increased. There is now shift of the mediastinum to the left. Left lung is grossly clear. Heart is normal in size. No pneumothorax.  IMPRESSION: Increasing right pleural effusion resulting in mediastinal shift to the left.   Electronically Signed   By: Marybelle Killings M.D.   On: 04/21/2015 13:42   Ct Abdomen Pelvis W Contrast  04/21/2015   CLINICAL DATA:  Severe generalized abdominal pain. Hematemesis and rectal bleeding. nausea and vomiting. Cirrhosis.  EXAM: CT ABDOMEN AND PELVIS WITH CONTRAST  TECHNIQUE: Multidetector CT imaging of the abdomen and pelvis was performed using the standard protocol following bolus administration of intravenous contrast.  CONTRAST:  136mL OMNIPAQUE IOHEXOL 300 MG/ML SOLN, 86mL OMNIPAQUE IOHEXOL 300 MG/ML SOLN  COMPARISON:  10/25/2013  FINDINGS: Lower Chest: New moderate to large right pleural effusion and right lower lobe atelectasis demonstrated.  Hepatobiliary: Hepatic cirrhosis again demonstrated, however no liver masses are identified. Prior cholecystectomy noted. No evidence of biliary dilatation.  Pancreas: No mass, inflammatory changes, or other significant abnormality identified.  Spleen:  Within normal limits in size and appearance.  Adrenals:  No masses identified.  Kidneys/Urinary Tract:  No evidence of masses or hydronephrosis.  Stomach/Bowel/Peritoneum: Moderate ascites as well as diffuse body wall and mesenteric edema shows no significant interval change. Diffuse colonic diverticulosis is seen, without evidence of focal diverticulitis. There is mild diffuse small bowel and colonic wall thickening, likely due to  hypoalbuminemia in the setting of cirrhosis. No evidence of dilated bowel loops or bowel obstruction. No focal inflammatory process or abscess identified.  Vascular/Lymphatic: No pathologically enlarged lymph nodes identified. No abdominal aortic aneurysm or other significant retroperitoneal abnormality demonstrated. Portal and splenic veins remain patent.  Reproductive: Multiple uterine fibroids again seen, largest measuring approximately 4 cm. Adnexal regions are unremarkable.  Other:  None.  Musculoskeletal:  No suspicious bone lesions identified.  IMPRESSION: Hepatic cirrhosis with moderate ascites and diffuse mesenteric and body wall edema, without significant change compared to prior exam. No evidence of hepatic neoplasm.  Mild diffuse small bowel and colonic wall thickening, likely due to hypoalbuminemia. Diverticulosis noted, without evidence of diverticulitis or other focal inflammatory process.  New moderate to large right pleural effusion and right lower lung atelectasis.  Uterine fibroids, largest measuring approximately 4 cm.   Electronically Signed   By: Earle Gell M.D.   On: 04/21/2015 13:36     Assessment/Plan Principal Problem:   BRBPR (bright red blood per rectum): In patient with history of EtOH cirrhosis in the setting of increased drinking over the last 3 weeks and frequent NSAID use. Chart review indicates current hemoglobin of 11 is down from 14.13 months ago. Continue Protonix will keep nothing by mouth have requested GI consult. Active Problems: Hematemesis. Concern for upper. We'll cycle CBCs monitor hemoglobin. No further episodes since admission. Diabetic as needed.  Pleural effusion, right: new per CT. Thoracentesis    Ascites: home meds include lasix, spironolactone. Holding these for now. Consider IV lasix  Hypoxemia: related to right pleural effusion. Have requested thoracentesis with labs. Continue oxygen support. Oxygen saturation level with supplement >90%  Anemia:  Likely multifactorial i.e. acute blood loss setting of chronic disease and EtOH abuse. Will get serial CBCs  Thrombocytopenia: related to liver disease ant ETOH most likely. No s/sx bleeding. monitor    Hypertension: Holding home medications which include lisinopril metoprolol Lasix and spironolactone. Will provide metoprolol IV with parameters    Cirrhosis, alcoholic: alk phos and ALT elevated. Total bili 1.6. She continues to drink. GI consult requested    Abdominal pain, left lower quadrant: related to above appears stable. Ct with diverticulosis. Hx same    Sleep apnea:CPAP    Chronic back pain: frequent advil use. Worsening last 2 days.    Lower extremity edema: related to above. Consider IV lasix.     Code Status: full DVT Prophylaxis: Family Communication: daughter at bedside Disposition Plan: home when ready  Time spent: 56 minutes  [facility] Pager [phone]

## 2015-04-21 NOTE — ED Notes (Addendum)
Pt states that she is having pain all over.  States especially in stomach and back.  Has been having blood in stool and has been vomiting blood since last night.  States is bright red in color.  Has increased etoh intake over past 3 weeks.

## 2015-04-21 NOTE — ED Notes (Signed)
Pt not able to void at this time.

## 2015-04-21 NOTE — Progress Notes (Signed)
DR Thornton Papas IN ROOM AND PREFORMED A THORACENTESIS. ON RT SIDE. REMOVED 1700CC CLEAR YELLOW FLUID.

## 2015-04-21 NOTE — ED Notes (Signed)
Having bloody stools, bright red with black stools, stools started yesterday. Vomiting up blood started 2 weeks ago, brightred.  Pt's Mother passed unexpectedly 3 weeks ago and pt has increased beer and volka intake.  C/o pain to lower back and lower stomach, rates pain 10/10 for both.  Has been drinking of last 4 to five years.

## 2015-04-21 NOTE — ED Provider Notes (Signed)
CSN: [id number]     Arrival date & time 04/21/15  0905 History  This chart was scribed for [name], MD by Rayna Sexton, ED scribe. This patient was seen in room APA09/[facility]09 and the patient's care was started at 9:27 AM.    Chief Complaint  Patient presents with  . Rectal Bleeding   The history is provided by the patient. No language interpreter was used.    HPI Comments: Savannah Jackson is a 59 y.o. female, with a history of HTN and cirrhosis, who presents to the Emergency Department complaining of worsening, moderate, generalized, pain to her abd and back with onset 1 day ago. Pt notes hematemesis and bloody stools as associated symptoms and further notes a large amount of blood during these occurrences. She notes chronic swelling in her legs that has worsened since the onset of symptoms and further notes having a prescription for her leg swelling which she hasn't taken for the past day. She notes chills but denies being around anyone recently who was ill and additionally notes urinating less frequently. She denies taking any blood thinning medications. She is unsure of any history of esophogeal varices and denies any history of rectal bleeding. She denies fevers or dizziness.   Past Medical History  Diagnosis Date  . Hypertension   . Brain aneurysm   . Cancer   . Rotator cuff syndrome of left shoulder   . DDD (degenerative disc disease), lumbar   . Chronic back pain Diverticultis  . Cirrhosis     alcoholic  . Sleep apnea    Past Surgical History  Procedure Laterality Date  . Cholecystectomy    . Brain surgery    . Breast surgery    . Total knee arthroplasty      right. 2002  . Cataract extraction Bilateral     [facility] 2 [facility] years ago  . Colonoscopy N/A 04/10/2013    Procedure: COLONOSCOPY;  Surgeon: Rogene Houston, MD;  Location: [facility];  Service: Endoscopy;  Laterality: N/A;  1030-rescheduled to 1200 Ann notified pt   History reviewed. No pertinent family  history. History  Substance Use Topics  . Smoking status: Never Smoker   . Smokeless tobacco: Not on file  . Alcohol Use: Yes   OB History    No data available     Review of Systems  Constitutional: Positive for chills. Negative for fever.  Cardiovascular: Positive for leg swelling.  Gastrointestinal: Positive for nausea, vomiting, abdominal pain, blood in stool and anal bleeding.  Genitourinary: Positive for decreased urine volume.  Musculoskeletal: Positive for myalgias and back pain.  Neurological: Negative for dizziness.  All other systems reviewed and are negative.     Allergies  Latex and Vicodin  Home Medications   Prior to Admission medications   Medication Sig Start Date End Date Taking? Authorizing Provider  diphenhydramine-acetaminophen (TYLENOL PM) 25-500 MG TABS Take 1 tablet by mouth at bedtime as needed.    Historical Provider, MD  furosemide (LASIX) 20 MG tablet Take 1 tablet (20 mg total) by mouth 2 (two) times daily. 10/25/13   [name], MD  guaiFENesin (MUCINEX) 600 MG 12 hr tablet Take 600 mg by mouth 2 (two) times daily.    Historical Provider, MD  levofloxacin (LEVAQUIN) 500 MG tablet Take 1 tablet (500 mg total) by mouth daily. 01/06/15   Milton Ferguson, MD  lisinopril (PRINIVIL,ZESTRIL) 20 MG tablet Take 1 tablet (20 mg total) by mouth daily. 07/18/12   Tesfaye  Legrand Rams, MD  metoprolol (LOPRESSOR) 50 MG tablet Take 50 mg by mouth 2 (two) times daily.    Historical Provider, MD  ondansetron (ZOFRAN ODT) 4 MG disintegrating tablet 4mg  ODT q4 hours prn nausea/vomit 01/06/15   Milton Ferguson, MD  potassium chloride 20 MEQ TBCR Take 20 mEq by mouth 2 (two) times daily. 10/25/13   [name], MD   BP 125/102 mmHg  Pulse 107  Temp(Src) 97.7 F (36.5 C) (Oral)  Resp 22  Ht 5\' 3"  (1.6 m)  Wt 178 lb (80.74 kg)  BMI 31.54 kg/m2  SpO2 100% Physical Exam  Constitutional: She is oriented to person, place, and time. She appears well-developed.  Appears  uncomfortable   HENT:  Head: Normocephalic and atraumatic.  Eyes: Conjunctivae and EOM are normal. Pupils are equal, round, and reactive to light.  Neck: Normal range of motion. Neck supple.  Cardiovascular: Normal rate and regular rhythm.   Pulmonary/Chest: Effort normal and breath sounds normal.  Abdominal: Soft. She exhibits distension. There is tenderness.  Soft; distended; mild diffuse tenderness; + fluid wave;   Musculoskeletal: She exhibits edema.  Symmetric lower extremity edema  Neurological: She is alert and oriented to person, place, and time.  Skin: Skin is warm and dry.  Psychiatric: She has a normal mood and affect. Her behavior is normal.  Nursing note and vitals reviewed.   ED Course  Procedures  DIAGNOSTIC STUDIES: Oxygen Saturation is 100% on RA, normal by my interpretation.    COORDINATION OF CARE: 9:32 AM Discussed treatment plan with pt at bedside and pt agreed to plan.  Labs Review Labs Reviewed  COMPREHENSIVE METABOLIC PANEL - Abnormal; Notable for the following:    CO2 18 (*)    BUN 37 (*)    Creatinine, Ser 1.03 (*)    Calcium 7.4 (*)    Total Protein 5.8 (*)    Albumin 1.9 (*)    AST 79 (*)    Alkaline Phosphatase 188 (*)    Total Bilirubin 1.6 (*)    GFR calc non Af Amer 58 (*)    All other components within normal limits  PROTIME-INR - Abnormal; Notable for the following:    Prothrombin Time 16.2 (*)    All other components within normal limits  CBC WITH DIFFERENTIAL/PLATELET - Abnormal; Notable for the following:    Hemoglobin 11.9 (*)    HCT 35.4 (*)    Platelets 124 (*)    Monocytes Relative 14 (*)    Basophils Relative 3 (*)    All other components within normal limits  LIPASE, BLOOD - Abnormal; Notable for the following:    Lipase 21 (*)    All other components within normal limits  URINALYSIS, ROUTINE W REFLEX MICROSCOPIC (NOT AT [facility])  TYPE AND SCREEN    Imaging Review Dg Chest 1 View  04/21/2015   CLINICAL DATA:  Rectal  bleeding  EXAM: CHEST  1 VIEW  COMPARISON:  01/06/2015  FINDINGS: Right pleural effusion increased. There is now shift of the mediastinum to the left. Left lung is grossly clear. Heart is normal in size. No pneumothorax.  IMPRESSION: Increasing right pleural effusion resulting in mediastinal shift to the left.   Electronically Signed   By: Marybelle Killings M.D.   On: 04/21/2015 13:42   Ct Abdomen Pelvis W Contrast  04/21/2015   CLINICAL DATA:  Severe generalized abdominal pain. Hematemesis and rectal bleeding. nausea and vomiting. Cirrhosis.  EXAM: CT ABDOMEN AND PELVIS WITH CONTRAST  TECHNIQUE: Multidetector  CT imaging of the abdomen and pelvis was performed using the standard protocol following bolus administration of intravenous contrast.  CONTRAST:  184mL OMNIPAQUE IOHEXOL 300 MG/ML SOLN, 32mL OMNIPAQUE IOHEXOL 300 MG/ML SOLN  COMPARISON:  10/25/2013  FINDINGS: Lower Chest: New moderate to large right pleural effusion and right lower lobe atelectasis demonstrated.  Hepatobiliary: Hepatic cirrhosis again demonstrated, however no liver masses are identified. Prior cholecystectomy noted. No evidence of biliary dilatation.  Pancreas: No mass, inflammatory changes, or other significant abnormality identified.  Spleen:  Within normal limits in size and appearance.  Adrenals:  No masses identified.  Kidneys/Urinary Tract:  No evidence of masses or hydronephrosis.  Stomach/Bowel/Peritoneum: Moderate ascites as well as diffuse body wall and mesenteric edema shows no significant interval change. Diffuse colonic diverticulosis is seen, without evidence of focal diverticulitis. There is mild diffuse small bowel and colonic wall thickening, likely due to hypoalbuminemia in the setting of cirrhosis. No evidence of dilated bowel loops or bowel obstruction. No focal inflammatory process or abscess identified.  Vascular/Lymphatic: No pathologically enlarged lymph nodes identified. No abdominal aortic aneurysm or other significant  retroperitoneal abnormality demonstrated. Portal and splenic veins remain patent.  Reproductive: Multiple uterine fibroids again seen, largest measuring approximately 4 cm. Adnexal regions are unremarkable.  Other:  None.  Musculoskeletal:  No suspicious bone lesions identified.  IMPRESSION: Hepatic cirrhosis with moderate ascites and diffuse mesenteric and body wall edema, without significant change compared to prior exam. No evidence of hepatic neoplasm.  Mild diffuse small bowel and colonic wall thickening, likely due to hypoalbuminemia. Diverticulosis noted, without evidence of diverticulitis or other focal inflammatory process.  New moderate to large right pleural effusion and right lower lung atelectasis.  Uterine fibroids, largest measuring approximately 4 cm.   Electronically Signed   By: Earle Gell M.D.   On: 04/21/2015 13:36     EKG Interpretation None      MDM   Final diagnoses:  Alcoholic cirrhosis of liver with ascites  Gastrointestinal hemorrhage, unspecified gastritis, unspecified gastrointestinal hemorrhage type  Pleural effusion    59 year old female with abdominal pain, nausea vomiting, rectal bleeding and hematemesis. Known cirrhotic. Dark red/maroon blood on exam. She does not appear to have a diagnosed history of esophageal varices. She does not think she's ever had upper endoscopy. She has vomited/spit up several times since being the emergency room and no obvious blood was noted with this. Chronic, mild thrombocytopenia. Mild elevation of prothrombin time.   Drop in hemoglobin from 14.1 three months ago to 11.9 today. CT with diffuse bowel wall thickening. Radiology questions secondary to hypoalbuminemia. With abdominal pain and nausea, vomiting the potentially be enteritis. Additionally, mild hypoxemia. Increased right pleural effusion on imaging. May need thoracentesis. No emergent  Indication as o2 sats are fine with supplemental oxygen via nasal cannula.  I personally  preformed the services scribed in my presence. The recorded information has been reviewed is accurate. [name], MD.    [name], MD 04/24/15 [phone]

## 2015-04-21 NOTE — ED Notes (Signed)
Abdominal distended, round, and slightly firm with hypoactive sounds.

## 2015-04-22 ENCOUNTER — Encounter (HOSPITAL_COMMUNITY): Payer: Self-pay | Admitting: *Deleted

## 2015-04-22 ENCOUNTER — Encounter (HOSPITAL_COMMUNITY)
Admission: EM | Disposition: A | Discharge status: Home / Self Care | Payer: Self-pay | Source: Home / Self Care | Attending: Internal Medicine

## 2015-04-22 DIAGNOSIS — K3189 Other diseases of stomach and duodenum: Secondary | ICD-10-CM

## 2015-04-22 DIAGNOSIS — I8500 Esophageal varices without bleeding: Secondary | ICD-10-CM

## 2015-04-22 HISTORY — PX: ESOPHAGOGASTRODUODENOSCOPY: SHX5428

## 2015-04-22 LAB — COMPREHENSIVE METABOLIC PANEL
ALT: 21 U/L (ref 14–54)
AST: 64 U/L — ABNORMAL HIGH (ref 15–41)
Albumin: 2.3 g/dL — ABNORMAL LOW (ref 3.5–5.0)
Alkaline Phosphatase: 148 U/L — ABNORMAL HIGH (ref 38–126)
Anion gap: 8 (ref 5–15)
BILIRUBIN TOTAL: 2.1 mg/dL — AB (ref 0.3–1.2)
BUN: 40 mg/dL — ABNORMAL HIGH (ref 6–20)
CO2: 20 mmol/L — AB (ref 22–32)
CREATININE: 1.41 mg/dL — AB (ref 0.44–1.00)
Calcium: 7.4 mg/dL — ABNORMAL LOW (ref 8.9–10.3)
Chloride: 114 mmol/L — ABNORMAL HIGH (ref 101–111)
GFR calc Af Amer: 46 mL/min — ABNORMAL LOW (ref >60)
GFR, EST NON AFRICAN AMERICAN: 40 mL/min — AB (ref >60)
Glucose, Bld: 94 mg/dL (ref 65–99)
Potassium: 5 mmol/L (ref 3.5–5.1)
Sodium: 142 mmol/L (ref 135–145)
Total Protein: 5.5 g/dL — ABNORMAL LOW (ref 6.5–8.1)

## 2015-04-22 LAB — CBC
HCT: 32.6 % — ABNORMAL LOW (ref 36.0–46.0)
Hemoglobin: 10.7 g/dL — ABNORMAL LOW (ref 12.0–15.0)
MCH: 29.5 pg (ref 26.0–34.0)
MCHC: 32.8 g/dL (ref 30.0–36.0)
MCV: 89.8 fL (ref 78.0–100.0)
PLATELETS: 95 10*3/uL — AB (ref 150–400)
RBC: 3.63 MIL/uL — ABNORMAL LOW (ref 3.87–5.11)
RDW: 15.1 % (ref 11.5–15.5)
WBC: 7.3 10*3/uL (ref 4.0–10.5)

## 2015-04-22 LAB — URINE MICROSCOPIC-ADD ON

## 2015-04-22 LAB — URINALYSIS, ROUTINE W REFLEX MICROSCOPIC
Bilirubin Urine: NEGATIVE
Glucose, UA: NEGATIVE mg/dL
Ketones, ur: NEGATIVE mg/dL
Leukocytes, UA: NEGATIVE
NITRITE: NEGATIVE
Protein, ur: 100 mg/dL — AB
SPECIFIC GRAVITY, URINE: 1.02 (ref 1.005–1.030)
UROBILINOGEN UA: 0.2 mg/dL (ref 0.0–1.0)
pH: 5.5 (ref 5.0–8.0)

## 2015-04-22 SURGERY — EGD (ESOPHAGOGASTRODUODENOSCOPY)
Anesthesia: Moderate Sedation

## 2015-04-22 MED ORDER — SODIUM CHLORIDE 0.9 % IV SOLN
INTRAVENOUS | Status: DC
  Administered 2015-04-22: 10:00:00 via INTRAVENOUS

## 2015-04-22 MED ORDER — MEPERIDINE HCL 50 MG/ML IJ SOLN
INTRAMUSCULAR | Status: AC
  Filled 2015-04-22: qty 1

## 2015-04-22 MED ORDER — MIDAZOLAM HCL 5 MG/5ML IJ SOLN
INTRAMUSCULAR | Status: DC | PRN
  Administered 2015-04-22: 1 mg via INTRAVENOUS
  Administered 2015-04-22 (×2): 2 mg via INTRAVENOUS

## 2015-04-22 MED ORDER — MEPERIDINE HCL 50 MG/ML IJ SOLN
INTRAMUSCULAR | Status: DC | PRN
  Administered 2015-04-22 (×2): 25 mg via INTRAVENOUS

## 2015-04-22 MED ORDER — ONDANSETRON HCL 4 MG/2ML IJ SOLN
INTRAMUSCULAR | Status: AC
  Filled 2015-04-22: qty 2

## 2015-04-22 MED ORDER — BUTAMBEN-TETRACAINE-BENZOCAINE 2-2-14 % EX AERO
INHALATION_SPRAY | CUTANEOUS | Status: DC | PRN
  Administered 2015-04-22: 2 via TOPICAL

## 2015-04-22 MED ORDER — MIDAZOLAM HCL 5 MG/5ML IJ SOLN
INTRAMUSCULAR | Status: AC
  Filled 2015-04-22: qty 10

## 2015-04-22 MED ORDER — STERILE WATER FOR IRRIGATION IR SOLN
Status: DC | PRN
  Administered 2015-04-22: 17:00:00

## 2015-04-22 MED ORDER — ONDANSETRON HCL 4 MG/2ML IJ SOLN
INTRAMUSCULAR | Status: DC | PRN
  Administered 2015-04-22: 4 mg via INTRAVENOUS

## 2015-04-22 NOTE — Progress Notes (Signed)
Subjective: Patient was admitted last night due to hematemesis and melena. Patient also had shortness of breath. She is a known case alcoholic liver cirrhosis with ascites. She had thoracentesis and scheduled for EGD.  Objective: Vital signs in last 24 hours: Temp:  [97.7 F (36.5 C)-98.2 F (36.8 C)] 98.1 F (36.7 C) (06/23 0400) Pulse Rate:  [66-122] 81 (06/23 0825) Resp:  [8-29] 15 (06/23 0825) BP: (98-153)/(78-117) 130/87 mmHg (06/23 0600) SpO2:  [87 %-100 %] 97 % (06/23 0825) Weight:  [78.9 kg (173 lb 15.1 oz)-80.74 kg (178 lb)] 78.9 kg (173 lb 15.1 oz) (06/23 0500) Weight change:  Last BM Date: 04/21/15  Intake/Output from previous day: 06/22 0701 - 06/23 0700 In: 660 [I.V.:60; IV Piggyback:600] Out: 225 [Urine:225]  PHYSICAL EXAM General appearance: alert and no distress Resp: diminished breath sounds bilaterally and rhonchi bilaterally Cardio: S1, S2 normal GI: distended, large ascites, bowel sound++ Extremities: extremities normal, atraumatic, no cyanosis or edema  Lab Results:  Results for orders placed or performed during the hospital encounter of 04/21/15 (from the past 48 hour(s))  Comprehensive metabolic panel     Status: Abnormal   Collection Time: 04/21/15  9:59 AM  Result Value Ref Range   Sodium 139 135 - 145 mmol/L   Potassium 4.4 3.5 - 5.1 mmol/L   Chloride 111 101 - 111 mmol/L   CO2 18 (L) 22 - 32 mmol/L   Glucose, Bld 94 65 - 99 mg/dL   BUN 37 (H) 6 - 20 mg/dL   Creatinine, Ser 1.03 (H) 0.44 - 1.00 mg/dL   Calcium 7.4 (L) 8.9 - 10.3 mg/dL   Total Protein 5.8 (L) 6.5 - 8.1 g/dL   Albumin 1.9 (L) 3.5 - 5.0 g/dL   AST 79 (H) 15 - 41 U/L   ALT 24 14 - 54 U/L   Alkaline Phosphatase 188 (H) 38 - 126 U/L   Total Bilirubin 1.6 (H) 0.3 - 1.2 mg/dL   GFR calc non Af Amer 58 (L) >60 mL/min   GFR calc Af Amer >60 >60 mL/min    Comment: (NOTE) The eGFR has been calculated using the CKD EPI equation. This calculation has not been validated in all clinical  situations. eGFR's persistently <60 mL/min signify possible Chronic Kidney Disease.    Anion gap 10 5 - 15  Protime-INR     Status: Abnormal   Collection Time: 04/21/15  9:59 AM  Result Value Ref Range   Prothrombin Time 16.2 (H) 11.6 - 15.2 seconds   INR 1.28 0.00 - 1.49  Type and screen     Status: None   Collection Time: 04/21/15  9:59 AM  Result Value Ref Range   ABO/RH(D) O POS    Antibody Screen NEG    Sample Expiration 04/24/2015   CBC with Differential     Status: Abnormal   Collection Time: 04/21/15  9:59 AM  Result Value Ref Range   WBC 5.7 4.0 - 10.5 K/uL   RBC 4.04 3.87 - 5.11 MIL/uL   Hemoglobin 11.9 (L) 12.0 - 15.0 g/dL   HCT 35.4 (L) 36.0 - 46.0 %   MCV 87.6 78.0 - 100.0 fL   MCH 29.5 26.0 - 34.0 pg   MCHC 33.6 30.0 - 36.0 g/dL   RDW 14.8 11.5 - 15.5 %   Platelets 124 (L) 150 - 400 K/uL   Neutrophils Relative % 58 43 - 77 %   Neutro Abs 3.3 1.7 - 7.7 K/uL   Lymphocytes Relative 23 12 -  46 %   Lymphs Abs 1.3 0.7 - 4.0 K/uL   Monocytes Relative 14 (H) 3 - 12 %   Monocytes Absolute 0.8 0.1 - 1.0 K/uL   Eosinophils Relative 2 0 - 5 %   Eosinophils Absolute 0.1 0.0 - 0.7 K/uL   Basophils Relative 3 (H) 0 - 1 %   Basophils Absolute 0.1 0.0 - 0.1 K/uL  Lipase, blood     Status: Abnormal   Collection Time: 04/21/15  9:59 AM  Result Value Ref Range   Lipase 21 (L) 22 - 51 U/L  CBC     Status: Abnormal   Collection Time: 04/21/15  4:00 PM  Result Value Ref Range   WBC 7.5 4.0 - 10.5 K/uL   RBC 4.10 3.87 - 5.11 MIL/uL   Hemoglobin 12.1 12.0 - 15.0 g/dL   HCT 36.7 36.0 - 46.0 %   MCV 89.5 78.0 - 100.0 fL   MCH 29.5 26.0 - 34.0 pg   MCHC 33.0 30.0 - 36.0 g/dL   RDW 15.1 11.5 - 15.5 %   Platelets 147 (L) 150 - 400 K/uL  MRSA PCR Screening     Status: None   Collection Time: 04/21/15  4:00 PM  Result Value Ref Range   MRSA by PCR NEGATIVE NEGATIVE    Comment:        The GeneXpert MRSA Assay (FDA approved for NASAL specimens only), is one component of  a comprehensive MRSA colonization surveillance program. It is not intended to diagnose MRSA infection nor to guide or monitor treatment for MRSA infections.   Ammonia     Status: Abnormal   Collection Time: 04/21/15  4:03 PM  Result Value Ref Range   Ammonia 57 (H) 9 - 35 umol/L  Lactate dehydrogenase, body fluid     Status: Abnormal   Collection Time: 04/21/15  4:53 PM  Result Value Ref Range   LD, Fluid 53 (H) 3 - 23 U/L    Comment: (NOTE) Results should be evaluated in conjunction with serum values    Fluid Type-FLDH Pleural R   Body fluid cell count with differential     Status: Abnormal   Collection Time: 04/21/15  4:53 PM  Result Value Ref Range   Fluid Type-FCT PLEURAL     Comment: CORRECTED ON 06/22 AT 1942: PREVIOUSLY REPORTED AS Pleural R   Color, Fluid YELLOW    Appearance, Fluid HAZY (A) CLEAR   WBC, Fluid 145 0 - 1000 cu mm   Neutrophil Count, Fluid 11 0 - 25 %   Lymphs, Fluid 33 %   Monocyte-Macrophage-Serous Fluid 56 50 - 90 %   Eos, Fluid 0 %   Other Cells, Fluid 0 %  Urinalysis, Routine w reflex microscopic (not at [facility])     Status: Abnormal   Collection Time: 04/22/15  3:05 AM  Result Value Ref Range   Color, Urine YELLOW YELLOW   APPearance HAZY (A) CLEAR   Specific Gravity, Urine 1.020 1.005 - 1.030   pH 5.5 5.0 - 8.0   Glucose, UA NEGATIVE NEGATIVE mg/dL   Hgb urine dipstick LARGE (A) NEGATIVE   Bilirubin Urine NEGATIVE NEGATIVE   Ketones, ur NEGATIVE NEGATIVE mg/dL   Protein, ur 100 (A) NEGATIVE mg/dL   Urobilinogen, UA 0.2 0.0 - 1.0 mg/dL   Nitrite NEGATIVE NEGATIVE   Leukocytes, UA NEGATIVE NEGATIVE  Urine microscopic-add on     Status: Abnormal   Collection Time: 04/22/15  3:05 AM  Result Value Ref Range  Squamous Epithelial / LPF MANY (A) RARE   WBC, UA 0-2 <3 WBC/hpf   RBC / HPF 7-10 <3 RBC/hpf   Bacteria, UA MANY (A) RARE   Casts HYALINE CASTS (A) NEGATIVE    Comment: GRANULAR CAST  Comprehensive metabolic panel     Status:  Abnormal   Collection Time: 04/22/15  4:09 AM  Result Value Ref Range   Sodium 142 135 - 145 mmol/L   Potassium 5.0 3.5 - 5.1 mmol/L   Chloride 114 (H) 101 - 111 mmol/L   CO2 20 (L) 22 - 32 mmol/L   Glucose, Bld 94 65 - 99 mg/dL   BUN 40 (H) 6 - 20 mg/dL   Creatinine, Ser 1.41 (H) 0.44 - 1.00 mg/dL   Calcium 7.4 (L) 8.9 - 10.3 mg/dL   Total Protein 5.5 (L) 6.5 - 8.1 g/dL   Albumin 2.3 (L) 3.5 - 5.0 g/dL   AST 64 (H) 15 - 41 U/L   ALT 21 14 - 54 U/L   Alkaline Phosphatase 148 (H) 38 - 126 U/L   Total Bilirubin 2.1 (H) 0.3 - 1.2 mg/dL   GFR calc non Af Amer 40 (L) >60 mL/min   GFR calc Af Amer 46 (L) >60 mL/min    Comment: (NOTE) The eGFR has been calculated using the CKD EPI equation. This calculation has not been validated in all clinical situations. eGFR's persistently <60 mL/min signify possible Chronic Kidney Disease.    Anion gap 8 5 - 15  CBC     Status: Abnormal   Collection Time: 04/22/15  4:09 AM  Result Value Ref Range   WBC 7.3 4.0 - 10.5 K/uL   RBC 3.63 (L) 3.87 - 5.11 MIL/uL   Hemoglobin 10.7 (L) 12.0 - 15.0 g/dL   HCT 32.6 (L) 36.0 - 46.0 %   MCV 89.8 78.0 - 100.0 fL   MCH 29.5 26.0 - 34.0 pg   MCHC 32.8 30.0 - 36.0 g/dL   RDW 15.1 11.5 - 15.5 %   Platelets 95 (L) 150 - 400 K/uL    Comment: SPECIMEN CHECKED FOR CLOTS RESULT REPEATED AND VERIFIED PLATELET COUNT CONFIRMED BY SMEAR     ABGS No results for input(s): PHART, PO2ART, TCO2, HCO3 in the last 72 hours.  Invalid input(s): PCO2 CULTURES Recent Results (from the past 240 hour(s))  MRSA PCR Screening     Status: None   Collection Time: 04/21/15  4:00 PM  Result Value Ref Range Status   MRSA by PCR NEGATIVE NEGATIVE Final    Comment:        The GeneXpert MRSA Assay (FDA approved for NASAL specimens only), is one component of a comprehensive MRSA colonization surveillance program. It is not intended to diagnose MRSA infection nor to guide or monitor treatment for MRSA infections.     Studies/Results: Dg Chest 1 View  04/21/2015   CLINICAL DATA:  Post RIGHT thoracentesis  EXAM: CHEST  1 VIEW  COMPARISON:  Portable exam 1716 hours compared to 1317 hours  FINDINGS: Marked decrease in RIGHT pleural effusion post thoracentesis.  No pneumothorax.  Mild residual RIGHT basilar atelectasis and pleural effusion.  Cardiac silhouette appears enlarged with slight vascular congestion.  No pulmonary infiltrates.  IMPRESSION: Marked decrease in RIGHT pleural effusion and basilar atelectasis post thoracentesis without pneumothorax.   Electronically Signed   By: Lavonia Dana M.D.   On: 04/21/2015 17:33   Dg Chest 1 View  04/21/2015   CLINICAL DATA:  Rectal bleeding  EXAM:  CHEST  1 VIEW  COMPARISON:  01/06/2015  FINDINGS: Right pleural effusion increased. There is now shift of the mediastinum to the left. Left lung is grossly clear. Heart is normal in size. No pneumothorax.  IMPRESSION: Increasing right pleural effusion resulting in mediastinal shift to the left.   Electronically Signed   By: Marybelle Killings M.D.   On: 04/21/2015 13:42   Ct Abdomen Pelvis W Contrast  04/21/2015   CLINICAL DATA:  Severe generalized abdominal pain. Hematemesis and rectal bleeding. nausea and vomiting. Cirrhosis.  EXAM: CT ABDOMEN AND PELVIS WITH CONTRAST  TECHNIQUE: Multidetector CT imaging of the abdomen and pelvis was performed using the standard protocol following bolus administration of intravenous contrast.  CONTRAST:  141mL OMNIPAQUE IOHEXOL 300 MG/ML SOLN, 66mL OMNIPAQUE IOHEXOL 300 MG/ML SOLN  COMPARISON:  10/25/2013  FINDINGS: Lower Chest: New moderate to large right pleural effusion and right lower lobe atelectasis demonstrated.  Hepatobiliary: Hepatic cirrhosis again demonstrated, however no liver masses are identified. Prior cholecystectomy noted. No evidence of biliary dilatation.  Pancreas: No mass, inflammatory changes, or other significant abnormality identified.  Spleen:  Within normal limits in size and  appearance.  Adrenals:  No masses identified.  Kidneys/Urinary Tract:  No evidence of masses or hydronephrosis.  Stomach/Bowel/Peritoneum: Moderate ascites as well as diffuse body wall and mesenteric edema shows no significant interval change. Diffuse colonic diverticulosis is seen, without evidence of focal diverticulitis. There is mild diffuse small bowel and colonic wall thickening, likely due to hypoalbuminemia in the setting of cirrhosis. No evidence of dilated bowel loops or bowel obstruction. No focal inflammatory process or abscess identified.  Vascular/Lymphatic: No pathologically enlarged lymph nodes identified. No abdominal aortic aneurysm or other significant retroperitoneal abnormality demonstrated. Portal and splenic veins remain patent.  Reproductive: Multiple uterine fibroids again seen, largest measuring approximately 4 cm. Adnexal regions are unremarkable.  Other:  None.  Musculoskeletal:  No suspicious bone lesions identified.  IMPRESSION: Hepatic cirrhosis with moderate ascites and diffuse mesenteric and body wall edema, without significant change compared to prior exam. No evidence of hepatic neoplasm.  Mild diffuse small bowel and colonic wall thickening, likely due to hypoalbuminemia. Diverticulosis noted, without evidence of diverticulitis or other focal inflammatory process.  New moderate to large right pleural effusion and right lower lung atelectasis.  Uterine fibroids, largest measuring approximately 4 cm.   Electronically Signed   By: Earle Gell M.D.   On: 04/21/2015 13:36   US Thoracentesis Asp Pleural Space W/img Guide  04/21/2015   CLINICAL DATA:  RIGHT pleural effusion, ascites, cirrhosis  EXAM: ULTRASOUND GUIDED DIAGNOSTIC AND THERAPEUTIC THORACENTESIS  COMPARISON:  None  PROCEDURE: Procedure, benefits, and risks of procedure were discussed with patient.  Written informed consent for procedure was obtained.  Time out protocol followed.  Pleural effusion localized by ultrasound  at the posterior RIGHT hemithorax.  Skin prepped and draped in usual sterile fashion.  Skin and soft tissues anesthetized with 10 mL of 1% lidocaine.  8 French thoracentesis catheter placed into the RIGHT pleural space.  1700 mL of clear light yellow fluid aspirated by syringe pump.  Procedure tolerated well by patient without immediate complication.  COMPLICATIONS: None  FINDINGS: A total of approximately 1700 mL of RIGHT pleural fluid was removed. A fluid sample of 180 mL was sent for laboratory analysis.  IMPRESSION: Successful ultrasound guided RIGHT thoracentesis yielding 1700 mL of pleural fluid.   Electronically Signed   By: Lavonia Dana M.D.   On: 04/21/2015 17:18  Medications: I have reviewed the patient's current medications.  Assesment:   Principal Problem:   GI bleed Active Problems:   Hypertension   Cirrhosis, alcoholic   Abdominal pain, left lower quadrant   BRBPR (bright red blood per rectum)   Sleep apnea   Lower extremity edema   ETOH abuse   Pleural effusion, right   Ascites   Anemia   Pleural effusion   Bleeding gastrointestinal    Plan:  Medications reviewed GI consult Watch for alcohol withdrawal syndrome Will monitor cbc/BMP    LOS: 1 day   Pearlie Nies 04/22/2015, 8:35 AM

## 2015-04-22 NOTE — Care Management Note (Signed)
Case Management Note  Patient Details  Name: Savannah Jackson MRN: [id number] Date of Birth: 1956-02-10   Expected Discharge Date:  04/24/15               Expected Discharge Plan:  Home/Self Care  In-House Referral:  Chaplain  Discharge planning Services  CM Consult  Post Acute Care Choice:  NA Choice offered to:  NA  DME Arranged:    DME Agency:     HH Arranged:    [place] Agency:     Status of Service:  Completed, signed off  Medicare Important Message Given:    Date Medicare IM Given:    Medicare IM give by:    Date Additional Medicare IM Given:    Additional Medicare Important Message give by:     If discussed at [place] of Stay Meetings, dates discussed:    Additional Comments: Pt is from home and is independent at baseline. Pt admitted for cardiac irregularities. Pt plans to return home at discharge with self care. No CM needs anticipated.  Sherald Barge, RN 04/22/2015, 12:58 PM

## 2015-04-22 NOTE — Clinical Social Work Note (Addendum)
CSW attempted to complete assessment following referral for substance use. Discussed with RN, pt having EGD. Will follow up in AM.  Benay Pike, [place]

## 2015-04-22 NOTE — Op Note (Signed)
EGD PROCEDURE REPORT  PATIENT:  Savannah Jackson  MR#:  [id number] Birthdate:  October 29, 1956, 59 y.o., female Endoscopist:  Dr. Rogene Houston, MD Referred By:  Dr. Murray Hodgkins, MD  Procedure Date: 04/22/2015  Procedure:   EGD  Indications:  Patient is 59 year old African female with history of alcoholic cirrhosis who presents with nausea vomiting coffee-ground emesis. She also said rectal bleeding which appears to be from a separate source.            Informed Consent:  The risks, benefits, alternatives & imponderables which include, but are not limited to, bleeding, infection, perforation, drug reaction and potential missed lesion have been reviewed.  The potential for biopsy, lesion removal, esophageal dilation, etc. have also been discussed.  Questions have been answered.  All parties agreeable.  Please see history & physical in medical record for more information.  Medications:  Demerol 50 mg IV Versed 5 mg IV Ondansetron 4 mg IV post procedure. Cetacaine spray topically for oropharyngeal anesthesia  Description of procedure:  The endoscope was introduced through the mouth and advanced to the second portion of the duodenum without difficulty or limitations. The mucosal surfaces were surveyed very carefully during advancement of the scope and upon withdrawal.  Findings:  Esophagus:  Mucosa of the proximal and middle segment was normal. Distally 3 columns of esophageal varices identified in these were no more than grade 1. GEJ:  37 cm Stomach:  Stomach was empty and distended very well with insufflation. Folds in the proximal stomach were normal. Examination mucosa at gastric body and antrum revealed mosaic pattern or snake skinning. No erosions or ulcers are other abnormalities noted. Angularis was unremarkable. Fundus and cardia were carefully examined and no varices identified. Duodenum:  Normal bulbar and post bulbar mucosa.  Therapeutic/Diagnostic Maneuvers Performed:   None  Complications:  None  Impression: Three columns of grade 1 esophageal varices. Portal gastropathy. No evidence of peptic ulcer disease.  Comment; She may have bled from Mallory-Weiss tear which has healed by now.   Recommendations:  Advance diet to heart healthy diet. CBC and comprehensive chemistry panel in a.m. Change pantoprazole to oral route in the next 24 hours.  REHMAN,NAJEEB U  04/22/2015  4:58 PM  CC: Dr. Rosita Fire, MD & Dr. Rayne Du ref. provider found

## 2015-04-22 NOTE — Progress Notes (Signed)
PT REMAINS VERY DROWSY, BUT HAD NO DIFFICULTY SWALLOWING HER FOOD THAT WAS FEED TO HER AT HER REQUEST.

## 2015-04-23 LAB — AMMONIA: Ammonia: 45 umol/L — ABNORMAL HIGH (ref 9–35)

## 2015-04-23 MED ORDER — DEXTROSE-NACL 5-0.45 % IV SOLN
INTRAVENOUS | Status: DC
  Administered 2015-04-23 – 2015-04-25 (×4): via INTRAVENOUS

## 2015-04-23 MED ORDER — LACTULOSE 10 GM/15ML PO SOLN
10.0000 g | Freq: Two times a day (BID) | ORAL | Status: DC
  Administered 2015-04-23 – 2015-04-29 (×13): 10 g via ORAL
  Filled 2015-04-23 (×13): qty 30

## 2015-04-23 NOTE — Progress Notes (Deleted)
Patient has saturated 2 sanitary pads since 0700. 2 moderate size blood clots noted in nuns cap when patient went to void. Will continue to monitor.

## 2015-04-23 NOTE — Progress Notes (Signed)
Subjective: Patient is more confused and disoriented. She is on DT protocol and received ativan. She had EGD yesterday which showed only grade I varices. No site of active bleeding. Objective: Vital signs in last 24 hours: Temp:  [97.3 F (36.3 C)-98.2 F (36.8 C)] 98.2 F (36.8 C) (06/24 0400) Pulse Rate:  [32-104] 64 (06/24 0400) Resp:  [15-31] 28 (06/24 0400) BP: (94-174)/(65-96) 143/81 mmHg (06/24 0400) SpO2:  [90 %-100 %] 96 % (06/24 0400) Weight:  [78.472 kg (173 lb)-78.8 kg (173 lb 11.6 oz)] 78.8 kg (173 lb 11.6 oz) (06/24 0500) Weight change: -2.268 kg (-5 lb) Last BM Date: 04/21/15  Intake/Output from previous day: 06/23 0701 - 06/24 0700 In: 1490 [P.O.:840; I.V.:250; IV Piggyback:400] Out: 700 [Urine:700]  PHYSICAL EXAM General appearance: alert and no distress Resp: diminished breath sounds bilaterally and rhonchi bilaterally Cardio: S1, S2 normal GI: distended, large ascites, bowel sound++ Extremities: extremities normal, atraumatic, no cyanosis or edema  Lab Results:  Results for orders placed or performed during the hospital encounter of 04/21/15 (from the past 48 hour(s))  Comprehensive metabolic panel     Status: Abnormal   Collection Time: 04/21/15  9:59 AM  Result Value Ref Range   Sodium 139 135 - 145 mmol/L   Potassium 4.4 3.5 - 5.1 mmol/L   Chloride 111 101 - 111 mmol/L   CO2 18 (L) 22 - 32 mmol/L   Glucose, Bld 94 65 - 99 mg/dL   BUN 37 (H) 6 - 20 mg/dL   Creatinine, Ser 1.03 (H) 0.44 - 1.00 mg/dL   Calcium 7.4 (L) 8.9 - 10.3 mg/dL   Total Protein 5.8 (L) 6.5 - 8.1 g/dL   Albumin 1.9 (L) 3.5 - 5.0 g/dL   AST 79 (H) 15 - 41 U/L   ALT 24 14 - 54 U/L   Alkaline Phosphatase 188 (H) 38 - 126 U/L   Total Bilirubin 1.6 (H) 0.3 - 1.2 mg/dL   GFR calc non Af Amer 58 (L) >60 mL/min   GFR calc Af Amer >60 >60 mL/min    Comment: (NOTE) The eGFR has been calculated using the CKD EPI equation. This calculation has not been validated in all clinical  situations. eGFR's persistently <60 mL/min signify possible Chronic Kidney Disease.    Anion gap 10 5 - 15  Protime-INR     Status: Abnormal   Collection Time: 04/21/15  9:59 AM  Result Value Ref Range   Prothrombin Time 16.2 (H) 11.6 - 15.2 seconds   INR 1.28 0.00 - 1.49  Type and screen     Status: None   Collection Time: 04/21/15  9:59 AM  Result Value Ref Range   ABO/RH(D) O POS    Antibody Screen NEG    Sample Expiration 04/24/2015   CBC with Differential     Status: Abnormal   Collection Time: 04/21/15  9:59 AM  Result Value Ref Range   WBC 5.7 4.0 - 10.5 K/uL   RBC 4.04 3.87 - 5.11 MIL/uL   Hemoglobin 11.9 (L) 12.0 - 15.0 g/dL   HCT 35.4 (L) 36.0 - 46.0 %   MCV 87.6 78.0 - 100.0 fL   MCH 29.5 26.0 - 34.0 pg   MCHC 33.6 30.0 - 36.0 g/dL   RDW 14.8 11.5 - 15.5 %   Platelets 124 (L) 150 - 400 K/uL   Neutrophils Relative % 58 43 - 77 %   Neutro Abs 3.3 1.7 - 7.7 K/uL   Lymphocytes Relative 23 12 -  46 %   Lymphs Abs 1.3 0.7 - 4.0 K/uL   Monocytes Relative 14 (H) 3 - 12 %   Monocytes Absolute 0.8 0.1 - 1.0 K/uL   Eosinophils Relative 2 0 - 5 %   Eosinophils Absolute 0.1 0.0 - 0.7 K/uL   Basophils Relative 3 (H) 0 - 1 %   Basophils Absolute 0.1 0.0 - 0.1 K/uL  Lipase, blood     Status: Abnormal   Collection Time: 04/21/15  9:59 AM  Result Value Ref Range   Lipase 21 (L) 22 - 51 U/L  CBC     Status: Abnormal   Collection Time: 04/21/15  4:00 PM  Result Value Ref Range   WBC 7.5 4.0 - 10.5 K/uL   RBC 4.10 3.87 - 5.11 MIL/uL   Hemoglobin 12.1 12.0 - 15.0 g/dL   HCT 36.7 36.0 - 46.0 %   MCV 89.5 78.0 - 100.0 fL   MCH 29.5 26.0 - 34.0 pg   MCHC 33.0 30.0 - 36.0 g/dL   RDW 15.1 11.5 - 15.5 %   Platelets 147 (L) 150 - 400 K/uL  MRSA PCR Screening     Status: None   Collection Time: 04/21/15  4:00 PM  Result Value Ref Range   MRSA by PCR NEGATIVE NEGATIVE    Comment:        The GeneXpert MRSA Assay (FDA approved for NASAL specimens only), is one component of  a comprehensive MRSA colonization surveillance program. It is not intended to diagnose MRSA infection nor to guide or monitor treatment for MRSA infections.   Ammonia     Status: Abnormal   Collection Time: 04/21/15  4:03 PM  Result Value Ref Range   Ammonia 57 (H) 9 - 35 umol/L  Lactate dehydrogenase, body fluid     Status: Abnormal   Collection Time: 04/21/15  4:53 PM  Result Value Ref Range   LD, Fluid 53 (H) 3 - 23 U/L    Comment: (NOTE) Results should be evaluated in conjunction with serum values    Fluid Type-FLDH Pleural R   Body fluid cell count with differential     Status: Abnormal   Collection Time: 04/21/15  4:53 PM  Result Value Ref Range   Fluid Type-FCT PLEURAL     Comment: CORRECTED ON 06/22 AT 1942: PREVIOUSLY REPORTED AS Pleural R   Color, Fluid YELLOW    Appearance, Fluid HAZY (A) CLEAR   WBC, Fluid 145 0 - 1000 cu mm   Neutrophil Count, Fluid 11 0 - 25 %   Lymphs, Fluid 33 %   Monocyte-Macrophage-Serous Fluid 56 50 - 90 %   Eos, Fluid 0 %   Other Cells, Fluid 0 %  Urinalysis, Routine w reflex microscopic (not at [facility])     Status: Abnormal   Collection Time: 04/22/15  3:05 AM  Result Value Ref Range   Color, Urine YELLOW YELLOW   APPearance HAZY (A) CLEAR   Specific Gravity, Urine 1.020 1.005 - 1.030   pH 5.5 5.0 - 8.0   Glucose, UA NEGATIVE NEGATIVE mg/dL   Hgb urine dipstick LARGE (A) NEGATIVE   Bilirubin Urine NEGATIVE NEGATIVE   Ketones, ur NEGATIVE NEGATIVE mg/dL   Protein, ur 100 (A) NEGATIVE mg/dL   Urobilinogen, UA 0.2 0.0 - 1.0 mg/dL   Nitrite NEGATIVE NEGATIVE   Leukocytes, UA NEGATIVE NEGATIVE  Urine microscopic-add on     Status: Abnormal   Collection Time: 04/22/15  3:05 AM  Result Value Ref Range  Squamous Epithelial / LPF MANY (A) RARE   WBC, UA 0-2 <3 WBC/hpf   RBC / HPF 7-10 <3 RBC/hpf   Bacteria, UA MANY (A) RARE   Casts HYALINE CASTS (A) NEGATIVE    Comment: GRANULAR CAST  Comprehensive metabolic panel     Status:  Abnormal   Collection Time: 04/22/15  4:09 AM  Result Value Ref Range   Sodium 142 135 - 145 mmol/L   Potassium 5.0 3.5 - 5.1 mmol/L   Chloride 114 (H) 101 - 111 mmol/L   CO2 20 (L) 22 - 32 mmol/L   Glucose, Bld 94 65 - 99 mg/dL   BUN 40 (H) 6 - 20 mg/dL   Creatinine, Ser 1.41 (H) 0.44 - 1.00 mg/dL   Calcium 7.4 (L) 8.9 - 10.3 mg/dL   Total Protein 5.5 (L) 6.5 - 8.1 g/dL   Albumin 2.3 (L) 3.5 - 5.0 g/dL   AST 64 (H) 15 - 41 U/L   ALT 21 14 - 54 U/L   Alkaline Phosphatase 148 (H) 38 - 126 U/L   Total Bilirubin 2.1 (H) 0.3 - 1.2 mg/dL   GFR calc non Af Amer 40 (L) >60 mL/min   GFR calc Af Amer 46 (L) >60 mL/min    Comment: (NOTE) The eGFR has been calculated using the CKD EPI equation. This calculation has not been validated in all clinical situations. eGFR's persistently <60 mL/min signify possible Chronic Kidney Disease.    Anion gap 8 5 - 15  CBC     Status: Abnormal   Collection Time: 04/22/15  4:09 AM  Result Value Ref Range   WBC 7.3 4.0 - 10.5 K/uL   RBC 3.63 (L) 3.87 - 5.11 MIL/uL   Hemoglobin 10.7 (L) 12.0 - 15.0 g/dL   HCT 32.6 (L) 36.0 - 46.0 %   MCV 89.8 78.0 - 100.0 fL   MCH 29.5 26.0 - 34.0 pg   MCHC 32.8 30.0 - 36.0 g/dL   RDW 15.1 11.5 - 15.5 %   Platelets 95 (L) 150 - 400 K/uL    Comment: SPECIMEN CHECKED FOR CLOTS RESULT REPEATED AND VERIFIED PLATELET COUNT CONFIRMED BY SMEAR     ABGS No results for input(s): PHART, PO2ART, TCO2, HCO3 in the last 72 hours.  Invalid input(s): PCO2 CULTURES Recent Results (from the past 240 hour(s))  MRSA PCR Screening     Status: None   Collection Time: 04/21/15  4:00 PM  Result Value Ref Range Status   MRSA by PCR NEGATIVE NEGATIVE Final    Comment:        The GeneXpert MRSA Assay (FDA approved for NASAL specimens only), is one component of a comprehensive MRSA colonization surveillance program. It is not intended to diagnose MRSA infection nor to guide or monitor treatment for MRSA infections.     Studies/Results: Dg Chest 1 View  04/21/2015   CLINICAL DATA:  Post RIGHT thoracentesis  EXAM: CHEST  1 VIEW  COMPARISON:  Portable exam 1716 hours compared to 1317 hours  FINDINGS: Marked decrease in RIGHT pleural effusion post thoracentesis.  No pneumothorax.  Mild residual RIGHT basilar atelectasis and pleural effusion.  Cardiac silhouette appears enlarged with slight vascular congestion.  No pulmonary infiltrates.  IMPRESSION: Marked decrease in RIGHT pleural effusion and basilar atelectasis post thoracentesis without pneumothorax.   Electronically Signed   By: Lavonia Dana M.D.   On: 04/21/2015 17:33   Dg Chest 1 View  04/21/2015   CLINICAL DATA:  Rectal bleeding  EXAM:  CHEST  1 VIEW  COMPARISON:  01/06/2015  FINDINGS: Right pleural effusion increased. There is now shift of the mediastinum to the left. Left lung is grossly clear. Heart is normal in size. No pneumothorax.  IMPRESSION: Increasing right pleural effusion resulting in mediastinal shift to the left.   Electronically Signed   By: Marybelle Killings M.D.   On: 04/21/2015 13:42   Ct Abdomen Pelvis W Contrast  04/21/2015   CLINICAL DATA:  Severe generalized abdominal pain. Hematemesis and rectal bleeding. nausea and vomiting. Cirrhosis.  EXAM: CT ABDOMEN AND PELVIS WITH CONTRAST  TECHNIQUE: Multidetector CT imaging of the abdomen and pelvis was performed using the standard protocol following bolus administration of intravenous contrast.  CONTRAST:  118mL OMNIPAQUE IOHEXOL 300 MG/ML SOLN, 37mL OMNIPAQUE IOHEXOL 300 MG/ML SOLN  COMPARISON:  10/25/2013  FINDINGS: Lower Chest: New moderate to large right pleural effusion and right lower lobe atelectasis demonstrated.  Hepatobiliary: Hepatic cirrhosis again demonstrated, however no liver masses are identified. Prior cholecystectomy noted. No evidence of biliary dilatation.  Pancreas: No mass, inflammatory changes, or other significant abnormality identified.  Spleen:  Within normal limits in size and  appearance.  Adrenals:  No masses identified.  Kidneys/Urinary Tract:  No evidence of masses or hydronephrosis.  Stomach/Bowel/Peritoneum: Moderate ascites as well as diffuse body wall and mesenteric edema shows no significant interval change. Diffuse colonic diverticulosis is seen, without evidence of focal diverticulitis. There is mild diffuse small bowel and colonic wall thickening, likely due to hypoalbuminemia in the setting of cirrhosis. No evidence of dilated bowel loops or bowel obstruction. No focal inflammatory process or abscess identified.  Vascular/Lymphatic: No pathologically enlarged lymph nodes identified. No abdominal aortic aneurysm or other significant retroperitoneal abnormality demonstrated. Portal and splenic veins remain patent.  Reproductive: Multiple uterine fibroids again seen, largest measuring approximately 4 cm. Adnexal regions are unremarkable.  Other:  None.  Musculoskeletal:  No suspicious bone lesions identified.  IMPRESSION: Hepatic cirrhosis with moderate ascites and diffuse mesenteric and body wall edema, without significant change compared to prior exam. No evidence of hepatic neoplasm.  Mild diffuse small bowel and colonic wall thickening, likely due to hypoalbuminemia. Diverticulosis noted, without evidence of diverticulitis or other focal inflammatory process.  New moderate to large right pleural effusion and right lower lung atelectasis.  Uterine fibroids, largest measuring approximately 4 cm.   Electronically Signed   By: Earle Gell M.D.   On: 04/21/2015 13:36   US Thoracentesis Asp Pleural Space W/img Guide  04/21/2015   CLINICAL DATA:  RIGHT pleural effusion, ascites, cirrhosis  EXAM: ULTRASOUND GUIDED DIAGNOSTIC AND THERAPEUTIC THORACENTESIS  COMPARISON:  None  PROCEDURE: Procedure, benefits, and risks of procedure were discussed with patient.  Written informed consent for procedure was obtained.  Time out protocol followed.  Pleural effusion localized by ultrasound  at the posterior RIGHT hemithorax.  Skin prepped and draped in usual sterile fashion.  Skin and soft tissues anesthetized with 10 mL of 1% lidocaine.  8 French thoracentesis catheter placed into the RIGHT pleural space.  1700 mL of clear light yellow fluid aspirated by syringe pump.  Procedure tolerated well by patient without immediate complication.  COMPLICATIONS: None  FINDINGS: A total of approximately 1700 mL of RIGHT pleural fluid was removed. A fluid sample of 180 mL was sent for laboratory analysis.  IMPRESSION: Successful ultrasound guided RIGHT thoracentesis yielding 1700 mL of pleural fluid.   Electronically Signed   By: Lavonia Dana M.D.   On: 04/21/2015 17:18  Medications: I have reviewed the patient's current medications.  Assesment:   Principal Problem:   GI bleed Active Problems:   Hypertension   Cirrhosis, alcoholic   Abdominal pain, left lower quadrant   BRBPR (bright red blood per rectum)   Sleep apnea   Lower extremity edema   ETOH abuse   Pleural effusion, right   Ascites   Anemia   Pleural effusion   Bleeding gastrointestinal metabolic encephalopathy, (alcohol VS hepatic)   Plan:  Medications reviewed GI consult appreciated Ammonia level Watch for alcohol withdrawal syndrome Will monitor cbc/BMP    LOS: 2 days   Savannah Jackson 04/23/2015, 8:08 AM

## 2015-04-23 NOTE — Clinical Social Work Note (Signed)
CSW attempted to assess patient, however patient could not be aroused.  CSW was informed that patient had been like this all morning.    Ihor Gully, [place]

## 2015-04-23 NOTE — Care Management Note (Signed)
Case Management Note  Patient Details  Name: Savannah Jackson MRN: [id number] Date of Birth: 12-Mar-1956  Expected Discharge Date:  04/24/15               Expected Discharge Plan:  Home/Self Care  In-House Referral:  Chaplain, Clinical Social Work  Discharge planning Services  CM Consult  Post Acute Care Choice:  NA Choice offered to:  NA  DME Arranged:    DME Agency:     HH Arranged:    [place] Agency:     Status of Service:  Completed, signed off  Medicare Important Message Given:  Yes Date Medicare IM Given:  04/23/15 Medicare IM give by:  Jolene Provost, RN, MSN, CM  Date Additional Medicare IM Given:    Additional Medicare Important Message give by:     If discussed at [place] of Stay Meetings, dates discussed:    Additional Comments: No CM needs if pt discharges home over weekend.  Sherald Barge, RN 04/23/2015, 11:44 AM

## 2015-04-23 NOTE — Progress Notes (Addendum)
  Subjective:  Patient is drowsy but able to answer questions. She denies abdominal pain. According to nursing staff she passed small clots today. No frank bleeding reported. She vomited earlier today and only took a few sips of apple juice which she is Down.   Objective: Blood pressure 141/89, pulse 67, temperature 97.7 F (36.5 C), temperature source Oral, resp. rate 21, height 5\' 3"  (1.6 m), weight 173 lb 11.6 oz (78.8 kg), SpO2 97 %. Patient is drowsy. Cardiac exam with regular rhythm normal S1 and S2. No murmur or gallop noted. Absent breath sounds at right lung base Abdomen is full but soft and nontender. Liver edge easily palpable. Starting. No LE edema or clubbing noted.  Labs/studies Results:   Recent Labs  04/21/15 0959 04/21/15 1600 04/22/15 0409  WBC 5.7 7.5 7.3  HGB 11.9* 12.1 10.7*  HCT 35.4* 36.7 32.6*  PLT 124* 147* 95*    BMET   Recent Labs  04/21/15 0959 04/22/15 0409  NA 139 142  K 4.4 5.0  CL 111 114*  CO2 18* 20*  GLUCOSE 94 94  BUN 37* 40*  CREATININE 1.03* 1.41*  CALCIUM 7.4* 7.4*    LFT   Recent Labs  04/21/15 0959 04/22/15 0409  PROT 5.8* 5.5*  ALBUMIN 1.9* 2.3*  AST 79* 64*  ALT 24 21  ALKPHOS 188* 148*  BILITOT 1.6* 2.1*    PT/INR   Recent Labs  04/21/15 0959  LABPROT 16.2*  INR 1.28     Assessment:  #1. Upper GI bleed inactive ostomy secondary to healed Mallory-Weiss tear. No evidence of melena. H&H not checked today. #2. Vomiting. Abdominal exam is benign. This symptom may be manifestation of alcohol withdrawal for which she is being treated.  Oral intake has been poor. #3. Rectal bleeding. She passed small clots but no frank bleeding reported the last 24 hours. Bleeding suspected to be hemorrhoidal. Last colonoscopy was in June 2014. #4. Ascites. Abdomen is not tense. Some of the fluid may be escaping into right pleura. #5. Decompensated liver disease with alcoholic hepatitis and hepatic encephalopathy. #6. Right  pleural effusion. Cell count not consistent with inflammatory process but cultures not done.   Recommendations;  Begin IV fluids with D5 half-normal saline at a rate of 75 mL per hour. Lactulose 10 g by mouth twice a day. Continue IV pantoprazole for now. Furosemide discontinued. CBC, Comprehensive chemistry panel and INR with a.m. Lab.

## 2015-04-24 DIAGNOSIS — K922 Gastrointestinal hemorrhage, unspecified: Secondary | ICD-10-CM

## 2015-04-24 LAB — CBC
HCT: 32.8 % — ABNORMAL LOW (ref 36.0–46.0)
Hemoglobin: 10.8 g/dL — ABNORMAL LOW (ref 12.0–15.0)
MCH: 29.4 pg (ref 26.0–34.0)
MCHC: 32.9 g/dL (ref 30.0–36.0)
MCV: 89.4 fL (ref 78.0–100.0)
Platelets: 72 10*3/uL — ABNORMAL LOW (ref 150–400)
RBC: 3.67 MIL/uL — ABNORMAL LOW (ref 3.87–5.11)
RDW: 15.1 % (ref 11.5–15.5)
WBC: 5.5 10*3/uL (ref 4.0–10.5)

## 2015-04-24 LAB — COMPREHENSIVE METABOLIC PANEL
ALT: 19 U/L (ref 14–54)
AST: 53 U/L — ABNORMAL HIGH (ref 15–41)
Albumin: 3.2 g/dL — ABNORMAL LOW (ref 3.5–5.0)
Alkaline Phosphatase: 135 U/L — ABNORMAL HIGH (ref 38–126)
Anion gap: 4 — ABNORMAL LOW (ref 5–15)
BUN: 29 mg/dL — ABNORMAL HIGH (ref 6–20)
CO2: 22 mmol/L (ref 22–32)
Calcium: 7.9 mg/dL — ABNORMAL LOW (ref 8.9–10.3)
Chloride: 117 mmol/L — ABNORMAL HIGH (ref 101–111)
Creatinine, Ser: 1.28 mg/dL — ABNORMAL HIGH (ref 0.44–1.00)
GFR calc Af Amer: 52 mL/min — ABNORMAL LOW (ref >60)
GFR calc non Af Amer: 45 mL/min — ABNORMAL LOW (ref >60)
Glucose, Bld: 111 mg/dL — ABNORMAL HIGH (ref 65–99)
Potassium: 3.8 mmol/L (ref 3.5–5.1)
Sodium: 143 mmol/L (ref 135–145)
Total Bilirubin: 2.1 mg/dL — ABNORMAL HIGH (ref 0.3–1.2)
Total Protein: 5.8 g/dL — ABNORMAL LOW (ref 6.5–8.1)

## 2015-04-24 LAB — PROTIME-INR
INR: 1.71 — AB (ref 0.00–1.49)
PROTHROMBIN TIME: 20.1 s — AB (ref 11.6–15.2)

## 2015-04-24 NOTE — Progress Notes (Addendum)
Remains in the ICU. Patient stable. Has been agitated; currently receiving Ativan via withdrawal protocol. Nursing staff reports just a small amount of old bloody residue in her last bowel movement. No overt bleeding observed over past 24 hours.  Hemoglobin stable at 10.8. Total bilirubin 2.1. BUN 29 creatinine 1.28.  Vital signs in last 24 hours: Temp:  [97.7 F (36.5 C)-98.5 F (36.9 C)] 98.2 F (36.8 C) (06/25 1121) Pulse Rate:  [63-78] 66 (06/25 1200) Resp:  [21-34] 22 (06/25 1200) BP: (131-173)/(73-111) 158/85 mmHg (06/25 1200) SpO2:  [93 %-100 %] 99 % (06/25 1200) Last BM Date: 04/24/15 General:  Currently somewhat obtunded. Confused. Does not answer questions. Abdomen:  Abdomen mildly distended. An soft Extremities:  Without clubbing or edema.    Intake/Output from previous day: 06/24 0701 - 06/25 0700 In: 2602.7 [P.O.:240; I.V.:1562.7; IV Piggyback:800] Out: 1400 [Urine:850; Stool:550] Intake/Output this shift:    Lab Results:  Recent Labs  04/21/15 1600 04/22/15 0409 04/24/15 0055  WBC 7.5 7.3 5.5  HGB 12.1 10.7* 10.8*  HCT 36.7 32.6* 32.8*  PLT 147* 95* 72*   BMET  Recent Labs  04/22/15 0409 04/24/15 0055  NA 142 143  K 5.0 3.8  CL 114* 117*  CO2 20* 22  GLUCOSE 94 111*  BUN 40* 29*  CREATININE 1.41* 1.28*  CALCIUM 7.4* 7.9*   LFT  Recent Labs  04/24/15 0055  PROT 5.8*  ALBUMIN 3.2*  AST 53*  ALT 19  ALKPHOS 135*  BILITOT 2.1*   PT/INR  Recent Labs  04/24/15 0055  LABPROT 20.1*  INR 1.71*   Hepatitis Panel No results for input(s): HEPBSAG, HCVAB, HEPAIGM, HEPBIGM in the last 72 hours. C-Diff No results for input(s): CDIFFTOX in the last 72 hours.  Studies/Results: No results found.  Assessment: Principal Problem:   GI bleed Active Problems:   Hypertension   Cirrhosis, alcoholic   Abdominal pain, left lower quadrant   BRBPR (bright red blood per rectum)   Sleep apnea   Lower extremity edema   ETOH abuse   Pleural  effusion, right   Ascites   Anemia   Pleural effusion   Bleeding gastrointestinal  LOS: 3 days   Impression:  59 year old lady with the alcohol-related cirrhosis, complicated by upper GI bleed secondary to Mallory-Weiss tear,  hepatic encephalopathy and alcohol withdrawal syndrome. Prerenal azotemia-improved with cessation of diuretic therapy and gentle IV fluid hydration.  Recommendations:  Continue lactulose, Cipro and PPI. Would consider switching metoprolol to a nonselective beta blocker at some point to minimize chances of a future variceal hemorrhage.  Check CBC and B-MET in am      Manus Rudd  04/24/2015, 12:52 PM

## 2015-04-24 NOTE — Progress Notes (Signed)
Subjective: Patient remained confused and disoriented. No nausea or vomiting. Objective: Vital signs in last 24 hours: Temp:  [97.7 F (36.5 C)-98.5 F (36.9 C)] 98.1 F (36.7 C) (06/25 0756) Pulse Rate:  [62-78] 73 (06/25 0636) Resp:  [21-34] 22 (06/25 0636) BP: (131-173)/(73-111) 157/85 mmHg (06/25 0636) SpO2:  [93 %-100 %] 99 % (06/25 0636) Weight change:  Last BM Date: 04/21/15  Intake/Output from previous day: 06/24 0701 - 06/25 0700 In: 2602.7 [P.O.:240; I.V.:1562.7; IV Piggyback:800] Out: 1400 [Urine:850; Stool:550]  PHYSICAL EXAM General appearance: alert and no distress Resp: diminished breath sounds bilaterally and rhonchi bilaterally Cardio: S1, S2 normal GI: distended, large ascites, bowel sound++ Extremities: extremities normal, atraumatic, no cyanosis or edema  Lab Results:  Results for orders placed or performed during the hospital encounter of 04/21/15 (from the past 48 hour(s))  Ammonia     Status: Abnormal   Collection Time: 04/23/15  8:43 AM  Result Value Ref Range   Ammonia 45 (H) 9 - 35 umol/L  CBC     Status: Abnormal   Collection Time: 04/24/15 12:55 AM  Result Value Ref Range   WBC 5.5 4.0 - 10.5 K/uL   RBC 3.67 (L) 3.87 - 5.11 MIL/uL   Hemoglobin 10.8 (L) 12.0 - 15.0 g/dL   HCT 33.4 (L) 91.2 - 51.8 %   MCV 89.4 78.0 - 100.0 fL   MCH 29.4 26.0 - 34.0 pg   MCHC 32.9 30.0 - 36.0 g/dL   RDW 90.2 09.2 - 13.4 %   Platelets 72 (L) 150 - 400 K/uL    Comment: SPECIMEN CHECKED FOR CLOTS  Comprehensive metabolic panel     Status: Abnormal   Collection Time: 04/24/15 12:55 AM  Result Value Ref Range   Sodium 143 135 - 145 mmol/L   Potassium 3.8 3.5 - 5.1 mmol/L    Comment: DELTA CHECK NOTED   Chloride 117 (H) 101 - 111 mmol/L   CO2 22 22 - 32 mmol/L   Glucose, Bld 111 (H) 65 - 99 mg/dL   BUN 29 (H) 6 - 20 mg/dL   Creatinine, Ser 8.75 (H) 0.44 - 1.00 mg/dL   Calcium 7.9 (L) 8.9 - 10.3 mg/dL   Total Protein 5.8 (L) 6.5 - 8.1 g/dL   Albumin 3.2  (L) 3.5 - 5.0 g/dL   AST 53 (H) 15 - 41 U/L   ALT 19 14 - 54 U/L   Alkaline Phosphatase 135 (H) 38 - 126 U/L   Total Bilirubin 2.1 (H) 0.3 - 1.2 mg/dL   GFR calc non Af Amer 45 (L) >60 mL/min   GFR calc Af Amer 52 (L) >60 mL/min    Comment: (NOTE) The eGFR has been calculated using the CKD EPI equation. This calculation has not been validated in all clinical situations. eGFR's persistently <60 mL/min signify possible Chronic Kidney Disease.    Anion gap 4 (L) 5 - 15  Protime-INR     Status: Abnormal   Collection Time: 04/24/15 12:55 AM  Result Value Ref Range   Prothrombin Time 20.1 (H) 11.6 - 15.2 seconds   INR 1.71 (H) 0.00 - 1.49    ABGS No results for input(s): PHART, PO2ART, TCO2, HCO3 in the last 72 hours.  Invalid input(s): PCO2 CULTURES Recent Results (from the past 240 hour(s))  MRSA PCR Screening     Status: None   Collection Time: 04/21/15  4:00 PM  Result Value Ref Range Status   MRSA by PCR NEGATIVE NEGATIVE Final  Comment:        The GeneXpert MRSA Assay (FDA approved for NASAL specimens only), is one component of a comprehensive MRSA colonization surveillance program. It is not intended to diagnose MRSA infection nor to guide or monitor treatment for MRSA infections.    Studies/Results: No results found.  Medications: I have reviewed the patient's current medications.  Assesment:   Principal Problem:   GI bleed Active Problems:   Hypertension   Cirrhosis, alcoholic   Abdominal pain, left lower quadrant   BRBPR (bright red blood per rectum)   Sleep apnea   Lower extremity edema   ETOH abuse   Pleural effusion, right   Ascites   Anemia   Pleural effusion   Bleeding gastrointestinal metabolic encephalopathy, (alcohol VS hepatic)   Plan:  Medications reviewed Continue current treatment Will monitor ammonia level/cbc/CMP Continue supportive care     LOS: 3 days   Arwen Haseley 04/24/2015, 8:53 AM

## 2015-04-25 DIAGNOSIS — R188 Other ascites: Secondary | ICD-10-CM

## 2015-04-25 MED ORDER — IPRATROPIUM-ALBUTEROL 0.5-2.5 (3) MG/3ML IN SOLN
3.0000 mL | RESPIRATORY_TRACT | Status: DC | PRN
  Administered 2015-04-25: 3 mL via RESPIRATORY_TRACT
  Filled 2015-04-25: qty 3

## 2015-04-25 MED ORDER — LORAZEPAM 2 MG/ML IJ SOLN
1.0000 mg | INTRAMUSCULAR | Status: DC | PRN
Start: 2015-04-25 — End: 2015-04-29
  Administered 2015-04-25 – 2015-04-27 (×2): 1 mg via INTRAVENOUS
  Filled 2015-04-25 (×2): qty 1

## 2015-04-25 NOTE — Progress Notes (Signed)
Pt much more alert and following commands. VSS. IV patent. Pt spouse notififed about pt transfer to dept. 300. Report given to L.Bullins, Therapist, sports. Pt transferred via wheelchair to room 338 accompanied by nursing staff.

## 2015-04-25 NOTE — Progress Notes (Signed)
Pt on CIWA protocol, but with no coinciding ativan. She has tremors and fullness in her head. Spoke with Dr.Fanta who ordered for pt to receive 1 mg of Ativan IV or PO Q3 PRN. Will continue to monitor

## 2015-04-25 NOTE — Progress Notes (Signed)
Pt coughing and has audible wheezing. Spoke with Dr.Fagan who gave a verbal order for albuterol treatments Q4 PRN.Will continue to monitor.

## 2015-04-25 NOTE — Progress Notes (Signed)
Subjective: Patient is more alert and awake today. She is less agitated. She is taking her oral feeding. Objective: Vital signs in last 24 hours: Temp:  [97.9 F (36.6 C)-98.4 F (36.9 C)] 97.9 F (36.6 C) (06/26 0800) Pulse Rate:  [62-80] 75 (06/26 0800) Resp:  [17-28] 27 (06/26 0800) BP: (111-162)/(63-116) 111/66 mmHg (06/26 0800) SpO2:  [97 %-100 %] 98 % (06/26 0800) Weight:  [80.9 kg (178 lb 5.6 oz)] 80.9 kg (178 lb 5.6 oz) (06/26 0500) Weight change:  Last BM Date: 04/25/15  Intake/Output from previous day: 06/25 0701 - 06/26 0700 In: 2373.8 [P.O.:210; I.V.:1763.8; IV Piggyback:400] Out: 475 [Urine:475]  PHYSICAL EXAM General appearance: alert and no distress Resp: diminished breath sounds bilaterally and rhonchi bilaterally Cardio: S1, S2 normal GI: distended, large ascites, bowel sound++ Extremities: extremities normal, atraumatic, no cyanosis or edema  Lab Results:  Results for orders placed or performed during the hospital encounter of 04/21/15 (from the past 48 hour(s))  CBC     Status: Abnormal   Collection Time: 04/24/15 12:55 AM  Result Value Ref Range   WBC 5.5 4.0 - 10.5 K/uL   RBC 3.67 (L) 3.87 - 5.11 MIL/uL   Hemoglobin 10.8 (L) 12.0 - 15.0 g/dL   HCT 32.8 (L) 36.0 - 46.0 %   MCV 89.4 78.0 - 100.0 fL   MCH 29.4 26.0 - 34.0 pg   MCHC 32.9 30.0 - 36.0 g/dL   RDW 15.1 11.5 - 15.5 %   Platelets 72 (L) 150 - 400 K/uL    Comment: SPECIMEN CHECKED FOR CLOTS  Comprehensive metabolic panel     Status: Abnormal   Collection Time: 04/24/15 12:55 AM  Result Value Ref Range   Sodium 143 135 - 145 mmol/L   Potassium 3.8 3.5 - 5.1 mmol/L    Comment: DELTA CHECK NOTED   Chloride 117 (H) 101 - 111 mmol/L   CO2 22 22 - 32 mmol/L   Glucose, Bld 111 (H) 65 - 99 mg/dL   BUN 29 (H) 6 - 20 mg/dL   Creatinine, Ser 1.28 (H) 0.44 - 1.00 mg/dL   Calcium 7.9 (L) 8.9 - 10.3 mg/dL   Total Protein 5.8 (L) 6.5 - 8.1 g/dL   Albumin 3.2 (L) 3.5 - 5.0 g/dL   AST 53 (H) 15 - 41  U/L   ALT 19 14 - 54 U/L   Alkaline Phosphatase 135 (H) 38 - 126 U/L   Total Bilirubin 2.1 (H) 0.3 - 1.2 mg/dL   GFR calc non Af Amer 45 (L) >60 mL/min   GFR calc Af Amer 52 (L) >60 mL/min    Comment: (NOTE) The eGFR has been calculated using the CKD EPI equation. This calculation has not been validated in all clinical situations. eGFR's persistently <60 mL/min signify possible Chronic Kidney Disease.    Anion gap 4 (L) 5 - 15  Protime-INR     Status: Abnormal   Collection Time: 04/24/15 12:55 AM  Result Value Ref Range   Prothrombin Time 20.1 (H) 11.6 - 15.2 seconds   INR 1.71 (H) 0.00 - 1.49    ABGS No results for input(s): PHART, PO2ART, TCO2, HCO3 in the last 72 hours.  Invalid input(s): PCO2 CULTURES Recent Results (from the past 240 hour(s))  MRSA PCR Screening     Status: None   Collection Time: 04/21/15  4:00 PM  Result Value Ref Range Status   MRSA by PCR NEGATIVE NEGATIVE Final    Comment:  The GeneXpert MRSA Assay (FDA approved for NASAL specimens only), is one component of a comprehensive MRSA colonization surveillance program. It is not intended to diagnose MRSA infection nor to guide or monitor treatment for MRSA infections.    Studies/Results: No results found.  Medications: I have reviewed the patient's current medications.  Assesment:   Principal Problem:   GI bleed Active Problems:   Hypertension   Cirrhosis, alcoholic   Abdominal pain, left lower quadrant   BRBPR (bright red blood per rectum)   Sleep apnea   Lower extremity edema   ETOH abuse   Pleural effusion, right   Ascites   Anemia   Pleural effusion   Bleeding gastrointestinal metabolic encephalopathy, (alcohol VS hepatic)   Plan:  Medications reviewed Continue current treatment Continue supportive care     LOS: 4 days   Talisha Erby 04/25/2015, 9:05 AM

## 2015-04-25 NOTE — Progress Notes (Signed)
Patient stable. Moved out of the ICU to room 338. Has no complaints.  No further bleeding reported. Labs not done today.    Vital signs in last 24 hours: Temp:  [97.9 F (36.6 C)-98.4 F (36.9 C)] 97.9 F (36.6 C) (06/26 0800) Pulse Rate:  [62-83] 83 (06/26 0900) Resp:  [17-29] 29 (06/26 0900) BP: (111-158)/(63-116) 130/84 mmHg (06/26 0900) SpO2:  [97 %-100 %] 98 % (06/26 0900) Weight:  [178 lb 5.6 oz (80.9 kg)] 178 lb 5.6 oz (80.9 kg) (06/26 0500) Last BM Date: 04/25/15 General:   More alert today. Appears in no acute distress. Abdomen:  Nondistended. Positive bowel sounds. Soft and nontender without appreciable mass or organomegaly Extremities:  Without clubbing or edema.    Intake/Output from previous day: 06/25 0701 - 06/26 0700 In: 2373.8 [P.O.:210; I.V.:1763.8; IV Piggyback:400] Out: 475 [Urine:475] Intake/Output this shift:    Lab Results:  Recent Labs  04/24/15 0055  WBC 5.5  HGB 10.8*  HCT 32.8*  PLT 72*   BMET  Recent Labs  04/24/15 0055  NA 143  K 3.8  CL 117*  CO2 22  GLUCOSE 111*  BUN 29*  CREATININE 1.28*  CALCIUM 7.9*   LFT  Recent Labs  04/24/15 0055  PROT 5.8*  ALBUMIN 3.2*  AST 53*  ALT 19  ALKPHOS 135*  BILITOT 2.1*   PT/INR  Recent Labs  04/24/15 0055  LABPROT 20.1*  INR 1.71*   Hepatitis Panel No results for input(s): HEPBSAG, HCVAB, HEPAIGM, HEPBIGM in the last 72 hours. C-Diff No results for input(s): CDIFFTOX in the last 72 hours.  Studies/Results: No results found.  Assessment: Principal Problem:   GI bleed Active Problems:   Hypertension   Cirrhosis, alcoholic   Abdominal pain, left lower quadrant   BRBPR (bright red blood per rectum)   Sleep apnea   Lower extremity edema   ETOH abuse   Pleural effusion, right   Ascites   Anemia   Pleural effusion   Bleeding gastrointestinal   Impression:  Upper GI bleed resolved. Hemoglobin remained stable. EtOH cirrhosis. Hepatic  encephalopathy-improved.  Recommendations:  Continue supportive measures as outlined yesterday. Recheck CBC and comprehensive metabolic profile in the morning.

## 2015-04-26 ENCOUNTER — Inpatient Hospital Stay (HOSPITAL_COMMUNITY): Payer: Commercial Managed Care - HMO

## 2015-04-26 ENCOUNTER — Encounter (HOSPITAL_COMMUNITY): Payer: Self-pay | Admitting: Internal Medicine

## 2015-04-26 LAB — COMPREHENSIVE METABOLIC PANEL
ALT: 19 U/L (ref 14–54)
AST: 45 U/L — AB (ref 15–41)
Albumin: 2 g/dL — ABNORMAL LOW (ref 3.5–5.0)
Alkaline Phosphatase: 129 U/L — ABNORMAL HIGH (ref 38–126)
Anion gap: 3 — ABNORMAL LOW (ref 5–15)
BUN: 28 mg/dL — AB (ref 6–20)
CALCIUM: 7.7 mg/dL — AB (ref 8.9–10.3)
CHLORIDE: 114 mmol/L — AB (ref 101–111)
CO2: 23 mmol/L (ref 22–32)
Creatinine, Ser: 1.54 mg/dL — ABNORMAL HIGH (ref 0.44–1.00)
GFR, EST AFRICAN AMERICAN: 42 mL/min — AB (ref >60)
GFR, EST NON AFRICAN AMERICAN: 36 mL/min — AB (ref >60)
Glucose, Bld: 111 mg/dL — ABNORMAL HIGH (ref 65–99)
Potassium: 3.5 mmol/L (ref 3.5–5.1)
Sodium: 140 mmol/L (ref 135–145)
Total Bilirubin: 1.7 mg/dL — ABNORMAL HIGH (ref 0.3–1.2)
Total Protein: 4.4 g/dL — ABNORMAL LOW (ref 6.5–8.1)

## 2015-04-26 LAB — CBC
HEMATOCRIT: 30.6 % — AB (ref 36.0–46.0)
HEMOGLOBIN: 10.3 g/dL — AB (ref 12.0–15.0)
MCH: 29.5 pg (ref 26.0–34.0)
MCHC: 33.7 g/dL (ref 30.0–36.0)
MCV: 87.7 fL (ref 78.0–100.0)
Platelets: 85 10*3/uL — ABNORMAL LOW (ref 150–400)
RBC: 3.49 MIL/uL — ABNORMAL LOW (ref 3.87–5.11)
RDW: 15.4 % (ref 11.5–15.5)
WBC: 7.8 10*3/uL (ref 4.0–10.5)

## 2015-04-26 LAB — BLOOD GAS, ARTERIAL
Acid-base deficit: 2.6 mmol/L — ABNORMAL HIGH (ref 0.0–2.0)
BICARBONATE: 20.4 meq/L (ref 20.0–24.0)
FIO2: 0.21 %
O2 Saturation: 92.3 %
TCO2: 18.4 mmol/L (ref 0–100)
pCO2 arterial: 31.1 mmHg — ABNORMAL LOW (ref 35.0–45.0)
pH, Arterial: 7.432 (ref 7.350–7.450)
pO2, Arterial: 62.1 mmHg — ABNORMAL LOW (ref 80.0–100.0)

## 2015-04-26 MED ORDER — PANTOPRAZOLE SODIUM 40 MG PO TBEC
40.0000 mg | DELAYED_RELEASE_TABLET | Freq: Every day | ORAL | Status: DC
  Administered 2015-04-27 – 2015-04-29 (×3): 40 mg via ORAL
  Filled 2015-04-26 (×3): qty 1

## 2015-04-26 MED ORDER — VITAMIN K1 10 MG/ML IJ SOLN
10.0000 mg | Freq: Once | INTRAMUSCULAR | Status: AC
  Administered 2015-04-26: 10 mg via SUBCUTANEOUS
  Filled 2015-04-26: qty 1

## 2015-04-26 NOTE — Progress Notes (Signed)
She has reaccumulated pleural fluid. I suspect this communicates with the ascites. She will have therapeutic thoracentesis to help symptoms but may also need paracentesis to keep this from returning

## 2015-04-26 NOTE — Progress Notes (Signed)
Subjective: Patient developed shortness of breath and wheezing during the night. She was started on albuterol nebulizer. Now she is on BIPAP. No fever or chills. Objective: Vital signs in last 24 hours: Temp:  [98.2 F (36.8 C)-98.5 F (36.9 C)] 98.2 F (36.8 C) (06/27 1610) Pulse Rate:  [77-91] 81 (06/27 0619) Resp:  [18-29] 20 (06/27 0619) BP: (112-130)/(71-84) 120/77 mmHg (06/27 0619) SpO2:  [93 %-98 %] 95 % (06/27 0619) Weight:  [81.33 kg (179 lb 4.8 oz)] 81.33 kg (179 lb 4.8 oz) (06/27 0619) Weight change: 0.43 kg (15.2 oz) Last BM Date: 04/25/15  Intake/Output from previous day: 06/26 0701 - 06/27 0700 In: 862.5 [I.V.:862.5] Out: -   PHYSICAL EXAM General appearance: alert and no distress Resp: diminished breath sounds bilaterally and rhonchi bilaterally Cardio: S1, S2 normal GI: distended, large ascites, bowel sound++ Extremities: extremities normal, atraumatic, no cyanosis or edema  Lab Results:  Results for orders placed or performed during the hospital encounter of 04/21/15 (from the past 48 hour(s))  CBC     Status: Abnormal   Collection Time: 04/26/15  6:42 AM  Result Value Ref Range   WBC 7.8 4.0 - 10.5 K/uL   RBC 3.49 (L) 3.87 - 5.11 MIL/uL   Hemoglobin 10.3 (L) 12.0 - 15.0 g/dL   HCT 30.6 (L) 36.0 - 46.0 %   MCV 87.7 78.0 - 100.0 fL   MCH 29.5 26.0 - 34.0 pg   MCHC 33.7 30.0 - 36.0 g/dL   RDW 15.4 11.5 - 15.5 %   Platelets 85 (L) 150 - 400 K/uL    Comment: SPECIMEN CHECKED FOR CLOTS PLATELET COUNT CONFIRMED BY SMEAR   Comprehensive metabolic panel     Status: Abnormal   Collection Time: 04/26/15  6:42 AM  Result Value Ref Range   Sodium 140 135 - 145 mmol/L   Potassium 3.5 3.5 - 5.1 mmol/L   Chloride 114 (H) 101 - 111 mmol/L   CO2 23 22 - 32 mmol/L   Glucose, Bld 111 (H) 65 - 99 mg/dL   BUN 28 (H) 6 - 20 mg/dL   Creatinine, Ser 1.54 (H) 0.44 - 1.00 mg/dL   Calcium 7.7 (L) 8.9 - 10.3 mg/dL   Total Protein 4.4 (L) 6.5 - 8.1 g/dL   Albumin 2.0 (L)  3.5 - 5.0 g/dL   AST 45 (H) 15 - 41 U/L   ALT 19 14 - 54 U/L   Alkaline Phosphatase 129 (H) 38 - 126 U/L   Total Bilirubin 1.7 (H) 0.3 - 1.2 mg/dL   GFR calc non Af Amer 36 (L) >60 mL/min   GFR calc Af Amer 42 (L) >60 mL/min    Comment: (NOTE) The eGFR has been calculated using the CKD EPI equation. This calculation has not been validated in all clinical situations. eGFR's persistently <60 mL/min signify possible Chronic Kidney Disease.    Anion gap 3 (L) 5 - 15    ABGS No results for input(s): PHART, PO2ART, TCO2, HCO3 in the last 72 hours.  Invalid input(s): PCO2 CULTURES Recent Results (from the past 240 hour(s))  MRSA PCR Screening     Status: None   Collection Time: 04/21/15  4:00 PM  Result Value Ref Range Status   MRSA by PCR NEGATIVE NEGATIVE Final    Comment:        The GeneXpert MRSA Assay (FDA approved for NASAL specimens only), is one component of a comprehensive MRSA colonization surveillance program. It is not intended to diagnose MRSA  infection nor to guide or monitor treatment for MRSA infections.    Studies/Results: No results found.  Medications: I have reviewed the patient's current medications.  Assesment:   Principal Problem:   GI bleed Active Problems:   Hypertension   Cirrhosis, alcoholic   Abdominal pain, left lower quadrant   BRBPR (bright red blood per rectum)   Sleep apnea   Lower extremity edema   ETOH abuse   Pleural effusion, right   Ascites   Anemia   Pleural effusion   Bleeding gastrointestinal metabolic encephalopathy, (alcohol VS hepatic) SOB and wheezing  Plan:  Medications reviewed Continue nebulizer Pulmonary consult Continue current treatment Continue supportive care     LOS: 5 days   Margrete Delude 04/26/2015, 8:04 AM

## 2015-04-26 NOTE — Procedures (Signed)
PreOperative Dx: Cirrhosis, ascites, RIGHT pleural effusion Postoperative Dx: Cirrhosis, ascites, RIGHT pleural effusion Procedure:   US guided RIGHT thoracentesis Radiologist:  Thornton Papas Anesthesia:  10 ml of 1% lidocaine Specimen:  1800 ml of amber colored RIGHT pleural fluid EBL:   < 1 ml Complications: None

## 2015-04-26 NOTE — Consult Note (Signed)
Consult requested by:Dr. Legrand Rams Consult requested for respiratory distress:  HPI: This is a 59 year old who came to the emergency department because of GI bleeding and exacerbation of chronic cirrhosis. She had a very large pleural effusion when she was seen and had 1700 mL removed about 5 days ago. She had improved and had been able to move from the stepdown unit to a regular room yesterday but developed increasing problems with shortness of breath last night. She is currently on BiPAP. She does have a history of sleep apnea. No known history of lung disease. Studies are pending.  Past Medical History  Diagnosis Date  . Hypertension   . Brain aneurysm   . Cancer   . Rotator cuff syndrome of left shoulder   . DDD (degenerative disc disease), lumbar   . Chronic back pain Diverticultis  . Cirrhosis     alcoholic  . Sleep apnea   . ETOH abuse   . Bilateral leg edema   . Hepatomegaly     scope 2014  . Diverticulosis     scope 2014     History reviewed. No pertinent family history.   History   Social History  . Marital Status: Married    Spouse Name: N/A  . Number of Children: N/A  . Years of Education: N/A   Social History Main Topics  . Smoking status: Never Smoker   . Smokeless tobacco: Not on file  . Alcohol Use: Yes  . Drug Use: No  . Sexual Activity: Yes    Birth Control/ Protection: None   Other Topics Concern  . None   Social History Narrative     ROS: She is short of breath and difficult to give any answers but she's not having chest pain hemoptysis sputum production    Objective: Vital signs in last 24 hours: Temp:  [98.2 F (36.8 C)-98.5 F (36.9 C)] 98.2 F (36.8 C) (06/27 0619) Pulse Rate:  [77-91] 81 (06/27 0619) Resp:  [18-29] 20 (06/27 0619) BP: (112-130)/(71-84) 120/77 mmHg (06/27 0619) SpO2:  [93 %-98 %] 95 % (06/27 0619) Weight:  [81.33 kg (179 lb 4.8 oz)] 81.33 kg (179 lb 4.8 oz) (06/27 0619) Weight change: 0.43 kg (15.2 oz) Last BM  Date: 04/25/15  Intake/Output from previous day: 06/26 0701 - 06/27 0700 In: 862.5 [I.V.:862.5] Out: -   PHYSICAL EXAM She is awake. She is somewhat anxious. Her pupils react. Her nose and throat are clear. Neck is supple without masses. Her chest shows somewhat diminished breath sounds bilaterally. Heart is regular her abdomen is soft I don't see a definite fluid wave but her abdomen is protuberant. She has trace to 1+ edema of the extremities. Central nervous system examination grossly intact  Lab Results: Basic Metabolic Panel:  Recent Labs  04/24/15 0055 04/26/15 0642  NA 143 140  K 3.8 3.5  CL 117* 114*  CO2 22 23  GLUCOSE 111* 111*  BUN 29* 28*  CREATININE 1.28* 1.54*  CALCIUM 7.9* 7.7*   Liver Function Tests:  Recent Labs  04/24/15 0055 04/26/15 0642  AST 53* 45*  ALT 19 19  ALKPHOS 135* 129*  BILITOT 2.1* 1.7*  PROT 5.8* 4.4*  ALBUMIN 3.2* 2.0*   No results for input(s): LIPASE, AMYLASE in the last 72 hours.  Recent Labs  04/23/15 0843  AMMONIA 45*   CBC:  Recent Labs  04/24/15 0055 04/26/15 0642  WBC 5.5 7.8  HGB 10.8* 10.3*  HCT 32.8* 30.6*  MCV 89.4 87.7  PLT 72* 85*   Cardiac Enzymes: No results for input(s): CKTOTAL, CKMB, CKMBINDEX, TROPONINI in the last 72 hours. BNP: No results for input(s): PROBNP in the last 72 hours. D-Dimer: No results for input(s): DDIMER in the last 72 hours. CBG: No results for input(s): GLUCAP in the last 72 hours. Hemoglobin A1C: No results for input(s): HGBA1C in the last 72 hours. Fasting Lipid Panel: No results for input(s): CHOL, HDL, LDLCALC, TRIG, CHOLHDL, LDLDIRECT in the last 72 hours. Thyroid Function Tests: No results for input(s): TSH, T4TOTAL, FREET4, T3FREE, THYROIDAB in the last 72 hours. Anemia Panel: No results for input(s): VITAMINB12, FOLATE, FERRITIN, TIBC, IRON, RETICCTPCT in the last 72 hours. Coagulation:  Recent Labs  04/24/15 0055  LABPROT 20.1*  INR 1.71*   Urine Drug  Screen: Drugs of Abuse  No results found for: LABOPIA, COCAINSCRNUR, LABBENZ, AMPHETMU, THCU, LABBARB  Alcohol Level: No results for input(s): ETH in the last 72 hours. Urinalysis: No results for input(s): COLORURINE, LABSPEC, PHURINE, GLUCOSEU, HGBUR, BILIRUBINUR, KETONESUR, PROTEINUR, UROBILINOGEN, NITRITE, LEUKOCYTESUR in the last 72 hours.  Invalid input(s): APPERANCEUR Misc. Labs:   ABGS: No results for input(s): PHART, PO2ART, TCO2, HCO3 in the last 72 hours.  Invalid input(s): PCO2   MICROBIOLOGY: Recent Results (from the past 240 hour(s))  MRSA PCR Screening     Status: None   Collection Time: 04/21/15  4:00 PM  Result Value Ref Range Status   MRSA by PCR NEGATIVE NEGATIVE Final    Comment:        The GeneXpert MRSA Assay (FDA approved for NASAL specimens only), is one component of a comprehensive MRSA colonization surveillance program. It is not intended to diagnose MRSA infection nor to guide or monitor treatment for MRSA infections.     Studies/Results: No results found.  Medications:  Prior to Admission:  Prescriptions prior to admission  Medication Sig Dispense Refill Last Dose  . diphenhydramine-acetaminophen (TYLENOL PM) 25-500 MG TABS Take 1 tablet by mouth at bedtime as needed (headache).    unknown  . escitalopram (LEXAPRO) 10 MG tablet Take 1 tablet by mouth daily.   04/20/2015 at Unknown time  . furosemide (LASIX) 40 MG tablet Take 1 tablet by mouth daily.   04/20/2015 at Unknown time  . guaiFENesin (MUCINEX) 600 MG 12 hr tablet Take 600 mg by mouth 2 (two) times daily as needed for cough or to loosen phlegm.    unknown  . lisinopril (PRINIVIL,ZESTRIL) 20 MG tablet Take 1 tablet (20 mg total) by mouth daily. 30 tablet 3 04/20/2015 at Unknown time  . metoprolol (LOPRESSOR) 50 MG tablet Take 50 mg by mouth 2 (two) times daily.   04/20/2015 at 1800  . potassium chloride 20 MEQ TBCR Take 20 mEq by mouth 2 (two) times daily. 15 tablet 0 04/20/2015 at  Unknown time  . spironolactone (ALDACTONE) 25 MG tablet Take 1 tablet by mouth daily.   04/20/2015 at Unknown time   Scheduled: . antiseptic oral rinse  7 mL Mouth Rinse BID  . ciprofloxacin  400 mg Intravenous Q12H  . folic acid  1 mg Oral Daily  . lactulose  10 g Oral BID  . metoprolol  5 mg Intravenous 4 times per day  . multivitamin with minerals  1 tablet Oral Daily  . pantoprazole (PROTONIX) IV  40 mg Intravenous Q12H  . sodium chloride  3 mL Intravenous Q12H  . thiamine  100 mg Oral Daily   Or  . thiamine  100 mg Intravenous Daily  Continuous: . dextrose 5 % and 0.45% NaCl 75 mL/hr at 04/25/15 1953   [id number] chloride, ipratropium-albuterol, LORazepam, morphine injection, ondansetron **OR** ondansetron (ZOFRAN) IV, sodium chloride, traZODone  Assesment: She was admitted with GI bleeding and is known to have alcoholic cirrhosis and had a right pleural effusion and ascites. Her bleeding has stopped. She has become more short of breath and I am concerned that she may have reaccumulated the pleural effusion causing her to have problems. She is known to have sleep apnea but no other lung disease has been diagnosed. She is currently having difficulty and is on C Pap. Principal Problem:   GI bleed Active Problems:   Hypertension   Cirrhosis, alcoholic   Abdominal pain, left lower quadrant   BRBPR (bright red blood per rectum)   Sleep apnea   Lower extremity edema   ETOH abuse   Pleural effusion, right   Ascites   Anemia   Pleural effusion   Bleeding gastrointestinal    Plan: She will have arterial blood gas. She will have chest x-ray. She may need thoracentesis again. If she doesn't improve she may need transfer back to stepdown.    LOS: 5 days   Seldon Barrell L 04/26/2015, 8:36 AM

## 2015-04-26 NOTE — Progress Notes (Signed)
   Subjective:  Patient has been complaining of shortness of breath. She is to have a repeat thoracenteses later today. He denies nausea vomiting or abdominal pain. She also denies melena or rectal bleeding.   Objective: Blood pressure 123/77, pulse 79, temperature 98.2 F (36.8 C), temperature source Oral, resp. rate 20, height 5\' 3"  (1.6 m), weight 179 lb 4.8 oz (81.33 kg), SpO2 92 %. Patient is alert and oriented to place and person but not time. She has asterixis. Abdomen is full but soft and nontender without masses. Liver edge is firm. 2+ pitting edema noted to both legs.  Labs/studies Results:   Recent Labs  04/24/15 0055 04/26/15 0642  WBC 5.5 7.8  HGB 10.8* 10.3*  HCT 32.8* 30.6*  PLT 72* 85*    BMET   Recent Labs  04/24/15 0055 04/26/15 0642  NA 143 140  K 3.8 3.5  CL 117* 114*  CO2 22 23  GLUCOSE 111* 111*  BUN 29* 28*  CREATININE 1.28* 1.54*  CALCIUM 7.9* 7.7*    LFT   Recent Labs  04/24/15 0055 04/26/15 0642  PROT 5.8* 4.4*  ALBUMIN 3.2* 2.0*  AST 53* 45*  ALT 19 19  ALKPHOS 135* 129*  BILITOT 2.1* 1.7*    PT/INR   Recent Labs  04/24/15 0055  LABPROT 20.1*  INR 1.71*     Assessment:  #1. Upper GI bleed inactive most likely secondary to Mallory-Weiss tear. #2. Rectal bleeding inactive most likely secondary to hemorrhoids #3. Alcoholic hepatitis. AST and bilirubin are coming down. Hepatitis discriminant function has increased to 38.9. It be recalculated in a.m. unless INR corrects with vitamin K will consider short-term prednisone therapy. #4. Alcoholic cirrhosis complicated by ascites, right pleural effusion and  hepatic encephalopathy. 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 Low-dose serum albumin secondary to acute and chronic liver disease. #5. Thrombocytopenia secondary to chronic liver disease. #6. Anemia is multifactorial. #7. Elevated serum creatinine. She appears to be  well-hydrated. Will monitor.   Recommendations:  Discontinue Cipro. Change pantoprazole to oral route to 40 mg by mouth every morning. Vitamin K 10 mg subcutaneous 1 today. Metabolic 7, INR serum ammonia and bilirubin in a.m.

## 2015-04-26 NOTE — Progress Notes (Signed)
Patient was out of room and her husband said she went for a test. Will try to visit again.

## 2015-04-26 NOTE — Progress Notes (Signed)
Thoracentesis complete no signs of distress. 1831ml amber colored pleural fluid removed.

## 2015-04-26 NOTE — Clinical Social Work Note (Signed)
Clinical Social Work Assessment  Patient Details  Name: Savannah Jackson MRN: [id number] Date of Birth: 1956/04/13  Date of referral:  04/26/15               Reason for consult:  Substance Use/ETOH Abuse                Permission sought to share information with:    Permission granted to share information::     Name::        Agency::     Relationship::     Contact Information:     Housing/Transportation Living arrangements for the past 2 months:  Single Family Home Source of Information:  Patient Patient Interpreter Needed:  None Criminal Activity/Legal Involvement Pertinent to Current Situation/Hospitalization:  No - Comment as needed Significant Relationships:  Adult Children, Spouse Lives with:  Spouse Do you feel safe going back to the place where you live?  Yes Need for family participation in patient care:  No (Coment)  Care giving concerns:  Pt is independent and plans to return home.    Social Worker assessment / plan:  CSW met with pt at bedside following referral for substance use. Pt alert and oriented and reports that she lives with her husband. She describes her best support as her husband and daughter. Pt's husband is a bilateral amputee and pt helps take care of him. They have a friend assisting him while pt is in hospital. Pt is on disability which she reports is due to multiple health issues. She shared that her mother died about 3 weeks ago and this has been very difficult for pt. They had a very close relationship and pt saw her mother daily. CSW provided support and discussed grief and loss support through Hospice.   Pt admits that she was trying to cope with the loss of her mother by drinking more. She said that it took away some of the pain, but made her sick. CSW asked pt several times about the amount she was drinking, but just said "it was a lot." She admits to drinking beer and vodka. Pt has history of ETOH abuse and went to [employer] several years ago.  She is not sure if this was really helpful for her. Pt denies having a problem with alcohol now and does not feel that she needs any treatment. She states, "I can stop on my own." Pt was tearful as she discussed that her greatest motivation to remain sober is her grandchildren. CSW discussed this further and provided outpatient substance abuse referrals in case needed in the future. SBIRT not completed as pt did not directly answer several questions. CSW will sign off, but can be reconsulted if needed.   Employment status:  Disabled (Comment on whether or not currently receiving Disability) Insurance information:  Managed Medicare PT Recommendations:  Not assessed at this time Information / Referral to community resources:  Outpatient Substance Abuse Treatment Options  Patient/Family's Response to care:  Pt reports she does not need any outpatient follow up for substance use.   Patient/Family's Understanding of and Emotional Response to Diagnosis, Current Treatment, and Prognosis:  Pt appears to have understanding of admission diagnosis and is aware that her substance use is likely contributing to her health problems. She became emotional as she shared that she has grandkids and wants to be a good example for them. Pt states, "I know I can do better."   Emotional Assessment Appearance:  Appears older than stated age Attitude/Demeanor/Rapport:  Grieving Affect (typically observed):  Tearful/Crying Orientation:  Oriented to Self, Oriented to Place, Oriented to  Time, Oriented to Situation Alcohol / Substance use:  Not Applicable Psych involvement (Current and /or in the community):  No (Comment)  Discharge Needs  Concerns to be addressed:  Grief and Loss Concerns, Substance Abuse Concerns Readmission within the last 30 days:  No Current discharge risk:  Substance Abuse Barriers to Discharge:  Continued Medical Work up   [employer], [employer], Bernie 04/26/2015, 1:47 PM [phone]

## 2015-04-27 ENCOUNTER — Inpatient Hospital Stay (HOSPITAL_COMMUNITY): Payer: Commercial Managed Care - HMO

## 2015-04-27 LAB — BASIC METABOLIC PANEL
ANION GAP: 6 (ref 5–15)
BUN: 29 mg/dL — ABNORMAL HIGH (ref 6–20)
CALCIUM: 7.8 mg/dL — AB (ref 8.9–10.3)
CO2: 20 mmol/L — ABNORMAL LOW (ref 22–32)
Chloride: 115 mmol/L — ABNORMAL HIGH (ref 101–111)
Creatinine, Ser: 1.47 mg/dL — ABNORMAL HIGH (ref 0.44–1.00)
GFR calc Af Amer: 44 mL/min — ABNORMAL LOW (ref >60)
GFR calc non Af Amer: 38 mL/min — ABNORMAL LOW (ref >60)
GLUCOSE: 96 mg/dL (ref 65–99)
POTASSIUM: 3.5 mmol/L (ref 3.5–5.1)
SODIUM: 141 mmol/L (ref 135–145)

## 2015-04-27 LAB — PROTIME-INR
INR: 1.61 — ABNORMAL HIGH (ref 0.00–1.49)
Prothrombin Time: 19.2 seconds — ABNORMAL HIGH (ref 11.6–15.2)

## 2015-04-27 LAB — AMMONIA: AMMONIA: 38 umol/L — AB (ref 9–35)

## 2015-04-27 LAB — BILIRUBIN, TOTAL: Total Bilirubin: 2.2 mg/dL — ABNORMAL HIGH (ref 0.3–1.2)

## 2015-04-27 MED ORDER — PREDNISONE 10 MG PO TABS
30.0000 mg | ORAL_TABLET | Freq: Every day | ORAL | Status: DC
  Administered 2015-04-28 – 2015-04-29 (×2): 30 mg via ORAL
  Filled 2015-04-27 (×6): qty 1

## 2015-04-27 MED ORDER — METOPROLOL TARTRATE 50 MG PO TABS
50.0000 mg | ORAL_TABLET | Freq: Two times a day (BID) | ORAL | Status: DC
  Administered 2015-04-27 – 2015-04-29 (×5): 50 mg via ORAL
  Filled 2015-04-27 (×5): qty 1

## 2015-04-27 MED ORDER — LISINOPRIL 10 MG PO TABS
20.0000 mg | ORAL_TABLET | Freq: Every day | ORAL | Status: DC
  Administered 2015-04-28 – 2015-04-29 (×2): 20 mg via ORAL
  Filled 2015-04-27 (×2): qty 2

## 2015-04-27 NOTE — Progress Notes (Addendum)
Subjective: I think she is overall about the same. She is more alert. She had thoracentesis yesterday  Objective: Vital signs in last 24 hours: Temp:  [98 F (36.7 C)-98.4 F (36.9 C)] 98.4 F (36.9 C) (06/28 8594) Pulse Rate:  [78-95] 78 (06/28 0608) Resp:  [18] 18 (06/28 0608) BP: (123-143)/(74-92) 124/81 mmHg (06/28 0608) SpO2:  [92 %-97 %] 95 % (06/28 0608) Weight:  [79.697 kg (175 lb 11.2 oz)] 79.697 kg (175 lb 11.2 oz) (06/28 3962) Weight change: -1.633 kg (-3 lb 9.6 oz) Last BM Date: 04/25/15  Intake/Output from previous day: 06/27 0701 - 06/28 0700 In: 874.8 [P.O.:120; I.V.:754.8] Out: -   PHYSICAL EXAM General appearance: alert and mild distress Resp: rhonchi bilaterally Cardio: regular rate and rhythm, S1, S2 normal, no murmur, click, rub or gallop GI: Protuberant Extremities: extremities normal, atraumatic, no cyanosis or edema  Lab Results:  Results for orders placed or performed during the hospital encounter of 04/21/15 (from the past 48 hour(s))  CBC     Status: Abnormal   Collection Time: 04/26/15  6:42 AM  Result Value Ref Range   WBC 7.8 4.0 - 10.5 K/uL   RBC 3.49 (L) 3.87 - 5.11 MIL/uL   Hemoglobin 10.3 (L) 12.0 - 15.0 g/dL   HCT 18.1 (L) 96.8 - 27.9 %   MCV 87.7 78.0 - 100.0 fL   MCH 29.5 26.0 - 34.0 pg   MCHC 33.7 30.0 - 36.0 g/dL   RDW 96.5 73.8 - 01.3 %   Platelets 85 (L) 150 - 400 K/uL    Comment: SPECIMEN CHECKED FOR CLOTS PLATELET COUNT CONFIRMED BY SMEAR   Comprehensive metabolic panel     Status: Abnormal   Collection Time: 04/26/15  6:42 AM  Result Value Ref Range   Sodium 140 135 - 145 mmol/L   Potassium 3.5 3.5 - 5.1 mmol/L   Chloride 114 (H) 101 - 111 mmol/L   CO2 23 22 - 32 mmol/L   Glucose, Bld 111 (H) 65 - 99 mg/dL   BUN 28 (H) 6 - 20 mg/dL   Creatinine, Ser 0.17 (H) 0.44 - 1.00 mg/dL   Calcium 7.7 (L) 8.9 - 10.3 mg/dL   Total Protein 4.4 (L) 6.5 - 8.1 g/dL   Albumin 2.0 (L) 3.5 - 5.0 g/dL   AST 45 (H) 15 - 41 U/L   ALT 19  14 - 54 U/L   Alkaline Phosphatase 129 (H) 38 - 126 U/L   Total Bilirubin 1.7 (H) 0.3 - 1.2 mg/dL   GFR calc non Af Amer 36 (L) >60 mL/min   GFR calc Af Amer 42 (L) >60 mL/min    Comment: (NOTE) The eGFR has been calculated using the CKD EPI equation. This calculation has not been validated in all clinical situations. eGFR's persistently <60 mL/min signify possible Chronic Kidney Disease.    Anion gap 3 (L) 5 - 15  Blood gas, arterial     Status: Abnormal   Collection Time: 04/26/15  9:39 AM  Result Value Ref Range   FIO2 0.21 %   pH, Arterial 7.432 7.350 - 7.450   pCO2 arterial 31.1 (L) 35.0 - 45.0 mmHg   pO2, Arterial 62.1 (L) 80.0 - 100.0 mmHg   Bicarbonate 20.4 20.0 - 24.0 mEq/L   TCO2 18.4 0 - 100 mmol/L   Acid-base deficit 2.6 (H) 0.0 - 2.0 mmol/L   O2 Saturation 92.3 %   Collection site RIGHT RADIAL    Drawn by COLLECTED BY RT  Sample type ARTERIAL    Allens test (pass/fail) PASS PASS  Protime-INR     Status: Abnormal   Collection Time: 04/27/15  6:34 AM  Result Value Ref Range   Prothrombin Time 19.2 (H) 11.6 - 15.2 seconds   INR 1.61 (H) 0.00 - 6.04  Basic metabolic panel     Status: Abnormal   Collection Time: 04/27/15  6:34 AM  Result Value Ref Range   Sodium 141 135 - 145 mmol/L   Potassium 3.5 3.5 - 5.1 mmol/L   Chloride 115 (H) 101 - 111 mmol/L   CO2 20 (L) 22 - 32 mmol/L   Glucose, Bld 96 65 - 99 mg/dL   BUN 29 (H) 6 - 20 mg/dL   Creatinine, Ser 1.47 (H) 0.44 - 1.00 mg/dL   Calcium 7.8 (L) 8.9 - 10.3 mg/dL   GFR calc non Af Amer 38 (L) >60 mL/min   GFR calc Af Amer 44 (L) >60 mL/min    Comment: (NOTE) The eGFR has been calculated using the CKD EPI equation. This calculation has not been validated in all clinical situations. eGFR's persistently <60 mL/min signify possible Chronic Kidney Disease.    Anion gap 6 5 - 15  Bilirubin, total     Status: Abnormal   Collection Time: 04/27/15  6:34 AM  Result Value Ref Range   Total Bilirubin 2.2 (H) 0.3 -  1.2 mg/dL  Ammonia     Status: Abnormal   Collection Time: 04/27/15  6:34 AM  Result Value Ref Range   Ammonia 38 (H) 9 - 35 umol/L    ABGS  Recent Labs  04/26/15 0939  PHART 7.432  PO2ART 62.1*  TCO2 18.4  HCO3 20.4   CULTURES Recent Results (from the past 240 hour(s))  MRSA PCR Screening     Status: None   Collection Time: 04/21/15  4:00 PM  Result Value Ref Range Status   MRSA by PCR NEGATIVE NEGATIVE Final    Comment:        The GeneXpert MRSA Assay (FDA approved for NASAL specimens only), is one component of a comprehensive MRSA colonization surveillance program. It is not intended to diagnose MRSA infection nor to guide or monitor treatment for MRSA infections.    Studies/Results: Dg Chest 1 View  04/26/2015   CLINICAL DATA:  Post thoracentesis, cirrhosis with ascites and recurrent RIGHT pleural effusion  EXAM: CHEST  1 VIEW  COMPARISON:  Earlier exam of 04/26/2015  FINDINGS: Marked decrease in RIGHT pleural effusion and basilar atelectasis post thoracentesis.  No pneumothorax.  Heart remains enlarged.  Lungs otherwise clear.  IMPRESSION: Significantly decreased RIGHT pleural effusion and basilar atelectasis post thoracentesis without evidence of pneumothorax.  Patient reports symptomatic improvement in her breathing following the procedure.   Electronically Signed   By: Lavonia Dana M.D.   On: 04/26/2015 15:08   Dg Chest Port 1 View  04/26/2015   CLINICAL DATA:  Respiratory failure. Cough, occasionally productive. Subsequent encounter.  EXAM: PORTABLE CHEST - 1 VIEW  COMPARISON:  04/21/2015 radiographs.  FINDINGS: 0918 hours. There has been significant interval reaccumulation of the right pleural effusion following right-sided thoracentesis. This effusion appears moderate in volume with associated right basilar pulmonary opacity. There is no mediastinal shift or pneumothorax. The left lung is clear. The visualized mediastinum appears unchanged. Surgical clips noted in  the right axilla.  IMPRESSION: Interval reaccumulation of right-sided pleural effusion with associated right basilar atelectasis.   Electronically Signed   By: Caryl Comes.D.  On: 04/26/2015 09:33   US Thoracentesis Asp Pleural Space W/img Guide  04/26/2015   CLINICAL DATA:  Recurrent RIGHT pleural effusion, cirrhosis, ascites  EXAM: ULTRASOUND GUIDED THERAPEUTIC THORACENTESIS  COMPARISON:  04/21/2015  PROCEDURE: Procedure, benefits, and risks of procedure were discussed with patient.  Written informed consent for procedure was obtained.  Time out protocol followed.  Pleural effusion localized by ultrasound at the posterior RIGHT hemithorax.  Skin prepped and draped in usual sterile fashion.  Skin and soft tissues anesthetized with 10 mL of 1% lidocaine.  8 French thoracentesis catheter placed into the RIGHT pleural space.  At the midpoint of the procedure, the 8 [place] catheter became nonfunctional after a bout of coughing and was replaced by a 5 [place] Yueh catheter.  1800 mL of amber color fluid aspirated by syringe pump.  Procedure tolerated well by patient without immediate complication.  COMPLICATIONS: None  FINDINGS: As above  IMPRESSION: Successful ultrasound guided RIGHT thoracentesis yielding 1800 mL of pleural fluid.   Electronically Signed   By: Lavonia Dana M.D.   On: 04/26/2015 15:12    Medications:  Prior to Admission:  Prescriptions prior to admission  Medication Sig Dispense Refill Last Dose  . diphenhydramine-acetaminophen (TYLENOL PM) 25-500 MG TABS Take 1 tablet by mouth at bedtime as needed (headache).    unknown  . escitalopram (LEXAPRO) 10 MG tablet Take 1 tablet by mouth daily.   04/20/2015 at Unknown time  . furosemide (LASIX) 40 MG tablet Take 1 tablet by mouth daily.   04/20/2015 at Unknown time  . guaiFENesin (MUCINEX) 600 MG 12 hr tablet Take 600 mg by mouth 2 (two) times daily as needed for cough or to loosen phlegm.    unknown  . lisinopril (PRINIVIL,ZESTRIL) 20 MG  tablet Take 1 tablet (20 mg total) by mouth daily. 30 tablet 3 04/20/2015 at Unknown time  . metoprolol (LOPRESSOR) 50 MG tablet Take 50 mg by mouth 2 (two) times daily.   04/20/2015 at 1800  . potassium chloride 20 MEQ TBCR Take 20 mEq by mouth 2 (two) times daily. 15 tablet 0 04/20/2015 at Unknown time  . spironolactone (ALDACTONE) 25 MG tablet Take 1 tablet by mouth daily.   04/20/2015 at Unknown time   Scheduled: . antiseptic oral rinse  7 mL Mouth Rinse BID  . folic acid  1 mg Oral Daily  . lactulose  10 g Oral BID  . metoprolol  5 mg Intravenous 4 times per day  . multivitamin with minerals  1 tablet Oral Daily  . pantoprazole  40 mg Oral Daily  . sodium chloride  3 mL Intravenous Q12H  . thiamine  100 mg Oral Daily   Or  . thiamine  100 mg Intravenous Daily   Continuous: . dextrose 5 % and 0.45% NaCl 10 mL/hr at 04/26/15 1556   NUU:VOZDGU chloride, ipratropium-albuterol, LORazepam, morphine injection, ondansetron **OR** ondansetron (ZOFRAN) IV, sodium chloride, traZODone  Assesment: She was admitted with GI bleeding. She has alcoholic cirrhosis with ascites. She had a right pleural effusion and had respiratory failure from that. She's better after removal of 1800 mL of fluid. Principal Problem:   GI bleed Active Problems:   Hypertension   Cirrhosis, alcoholic   Abdominal pain, left lower quadrant   BRBPR (bright red blood per rectum)   Sleep apnea   Lower extremity edema   ETOH abuse   Pleural effusion, right   Ascites   Anemia   Pleural effusion   Bleeding gastrointestinal  Plan: Continue treatment. She had reaccumulated pleural fluid. I suspect she has communication between her abdominal cavity in her chest. She may reaccumulate again. She may require paracentesis but will leave that decision to GI. Continue to watch her respiratory status carefully. She will have chest x-ray today and tomorrow    LOS: 6 days   Noris Kulinski L 04/27/2015, 8:26 AM

## 2015-04-27 NOTE — Care Management Note (Signed)
Case Management Note  Patient Details  Name: Savannah Jackson MRN: [id number] Date of Birth: 06/27/1956  Subjective/Objective:                    Action/Plan:   Expected Discharge Date:  04/24/15               Expected Discharge Plan:  Home/Self Care  In-House Referral:  Chaplain, Clinical Social Work  Discharge planning Services  CM Consult  Post Acute Care Choice:  NA Choice offered to:  NA  DME Arranged:    DME Agency:     HH Arranged:    [place] Agency:     Status of Service:  Completed, signed off  Medicare Important Message Given:  Yes Date Medicare IM Given:  04/23/15 Medicare IM give by:  Jolene Provost, RN, MSN, CM  Date Additional Medicare IM Given:    Additional Medicare Important Message give by:     If discussed at [place] of Stay Meetings, dates discussed:  04/27/15  Additional Comments:  Joylene Draft, RN 04/27/2015, 2:50 PM

## 2015-04-27 NOTE — Progress Notes (Signed)
  Subjective:  Ration feels much better. She states her appetite has improved. Her daughter states she ate are for lunch. She is having multiple bowel movements but she denies melena or rectal bleeding. She knows she is having loose stools because of lactulose.   Objective: Blood pressure 124/81, pulse 78, temperature 98.4 F (36.9 C), temperature source Oral, resp. rate 18, height 5\' 3"  (1.6 m), weight 175 lb 11.2 oz (79.697 kg), SpO2 95 %. Patient is alert and does not have asterixis. Breath sounds are diminished at right base. Abdomen is full but soft and nontender with palpable liver and epigastric region but no tenderness noted. Trace edema noted around ankles.  Labs/studies Results:   Recent Labs  04/26/15 0642  WBC 7.8  HGB 10.3*  HCT 30.6*  PLT 85*    BMET   Recent Labs  04/26/15 0642 04/27/15 0634  NA 140 141  K 3.5 3.5  CL 114* 115*  CO2 23 20*  GLUCOSE 111* 96  BUN 28* 29*  CREATININE 1.54* 1.47*  CALCIUM 7.7* 7.8*    LFT   Recent Labs  04/26/15 0642 04/27/15 0634  PROT 4.4*  --   ALBUMIN 2.0*  --   AST 45*  --   ALT 19  --   ALKPHOS 129*  --   BILITOT 1.7* 2.2*    PT/INR   Recent Labs  04/27/15 0634  LABPROT 19.2*  INR 1.61*     Assessment:  #1. UGI and LGI bleed active. Hemoglobin is below normal. Etiology of upper GI felt to be due to Mallory-Weiss tear and rectal bleeding as a bili secondary to hemorrhoids. #2. Alcoholic liver disease. She has both stigmata of alcoholic cirrhosis as well as alcoholic hepatitis. HDF is 35 and she would therefore benefit from few weeks. He with prednisone. #3. Recurrent right pleural effusion. She has undergone thoracenteses on 2 occasions. 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 As liver function improves and serum albumin comes up fluid should gradually resolve #4. Thrombocytopenia secondary to chronic liver disease and  splenomegaly.   Recommendations:  Prednisone 30 mg po qd for 1 week after which dose can be tapered 5 mg every week. Will repeat labs in 2 days.

## 2015-04-27 NOTE — Progress Notes (Signed)
Subjective: Patient is awake and trying to respond to verbal communications. She is on BIPAP. She is being evaluated by pulmonary. Objective: Vital signs in last 24 hours: Temp:  [98 F (36.7 C)-98.4 F (36.9 C)] 98.4 F (36.9 C) (06/28 5732) Pulse Rate:  [78-95] 78 (06/28 0608) Resp:  [18] 18 (06/28 0608) BP: (123-143)/(74-92) 124/81 mmHg (06/28 0608) SpO2:  [92 %-97 %] 95 % (06/28 0608) Weight:  [79.697 kg (175 lb 11.2 oz)] 79.697 kg (175 lb 11.2 oz) (06/28 2025) Weight change: -1.633 kg (-3 lb 9.6 oz) Last BM Date: 04/25/15  Intake/Output from previous day: 06/27 0701 - 06/28 0700 In: 874.8 [P.O.:120; I.V.:754.8] Out: -   PHYSICAL EXAM General appearance: alert and no distress Resp: diminished breath sounds bilaterally and rhonchi bilaterally Cardio: S1, S2 normal GI: distended, large ascites, bowel sound++ Extremities: extremities normal, atraumatic, no cyanosis or edema  Lab Results:  Results for orders placed or performed during the hospital encounter of 04/21/15 (from the past 48 hour(s))  CBC     Status: Abnormal   Collection Time: 04/26/15  6:42 AM  Result Value Ref Range   WBC 7.8 4.0 - 10.5 K/uL   RBC 3.49 (L) 3.87 - 5.11 MIL/uL   Hemoglobin 10.3 (L) 12.0 - 15.0 g/dL   HCT 30.6 (L) 36.0 - 46.0 %   MCV 87.7 78.0 - 100.0 fL   MCH 29.5 26.0 - 34.0 pg   MCHC 33.7 30.0 - 36.0 g/dL   RDW 15.4 11.5 - 15.5 %   Platelets 85 (L) 150 - 400 K/uL    Comment: SPECIMEN CHECKED FOR CLOTS PLATELET COUNT CONFIRMED BY SMEAR   Comprehensive metabolic panel     Status: Abnormal   Collection Time: 04/26/15  6:42 AM  Result Value Ref Range   Sodium 140 135 - 145 mmol/L   Potassium 3.5 3.5 - 5.1 mmol/L   Chloride 114 (H) 101 - 111 mmol/L   CO2 23 22 - 32 mmol/L   Glucose, Bld 111 (H) 65 - 99 mg/dL   BUN 28 (H) 6 - 20 mg/dL   Creatinine, Ser 1.54 (H) 0.44 - 1.00 mg/dL   Calcium 7.7 (L) 8.9 - 10.3 mg/dL   Total Protein 4.4 (L) 6.5 - 8.1 g/dL   Albumin 2.0 (L) 3.5 - 5.0 g/dL    AST 45 (H) 15 - 41 U/L   ALT 19 14 - 54 U/L   Alkaline Phosphatase 129 (H) 38 - 126 U/L   Total Bilirubin 1.7 (H) 0.3 - 1.2 mg/dL   GFR calc non Af Amer 36 (L) >60 mL/min   GFR calc Af Amer 42 (L) >60 mL/min    Comment: (NOTE) The eGFR has been calculated using the CKD EPI equation. This calculation has not been validated in all clinical situations. eGFR's persistently <60 mL/min signify possible Chronic Kidney Disease.    Anion gap 3 (L) 5 - 15  Blood gas, arterial     Status: Abnormal   Collection Time: 04/26/15  9:39 AM  Result Value Ref Range   FIO2 0.21 %   pH, Arterial 7.432 7.350 - 7.450   pCO2 arterial 31.1 (L) 35.0 - 45.0 mmHg   pO2, Arterial 62.1 (L) 80.0 - 100.0 mmHg   Bicarbonate 20.4 20.0 - 24.0 mEq/L   TCO2 18.4 0 - 100 mmol/L   Acid-base deficit 2.6 (H) 0.0 - 2.0 mmol/L   O2 Saturation 92.3 %   Collection site RIGHT RADIAL    Drawn by COLLECTED BY  RT    Sample type ARTERIAL    Allens test (pass/fail) PASS PASS  Protime-INR     Status: Abnormal   Collection Time: 04/27/15  6:34 AM  Result Value Ref Range   Prothrombin Time 19.2 (H) 11.6 - 15.2 seconds   INR 1.61 (H) 0.00 - 3.55  Basic metabolic panel     Status: Abnormal   Collection Time: 04/27/15  6:34 AM  Result Value Ref Range   Sodium 141 135 - 145 mmol/L   Potassium 3.5 3.5 - 5.1 mmol/L   Chloride 115 (H) 101 - 111 mmol/L   CO2 20 (L) 22 - 32 mmol/L   Glucose, Bld 96 65 - 99 mg/dL   BUN 29 (H) 6 - 20 mg/dL   Creatinine, Ser 1.47 (H) 0.44 - 1.00 mg/dL   Calcium 7.8 (L) 8.9 - 10.3 mg/dL   GFR calc non Af Amer 38 (L) >60 mL/min   GFR calc Af Amer 44 (L) >60 mL/min    Comment: (NOTE) The eGFR has been calculated using the CKD EPI equation. This calculation has not been validated in all clinical situations. eGFR's persistently <60 mL/min signify possible Chronic Kidney Disease.    Anion gap 6 5 - 15  Bilirubin, total     Status: Abnormal   Collection Time: 04/27/15  6:34 AM  Result Value Ref  Range   Total Bilirubin 2.2 (H) 0.3 - 1.2 mg/dL  Ammonia     Status: Abnormal   Collection Time: 04/27/15  6:34 AM  Result Value Ref Range   Ammonia 38 (H) 9 - 35 umol/L    ABGS  Recent Labs  04/26/15 0939  PHART 7.432  PO2ART 62.1*  TCO2 18.4  HCO3 20.4   CULTURES Recent Results (from the past 240 hour(s))  MRSA PCR Screening     Status: None   Collection Time: 04/21/15  4:00 PM  Result Value Ref Range Status   MRSA by PCR NEGATIVE NEGATIVE Final    Comment:        The GeneXpert MRSA Assay (FDA approved for NASAL specimens only), is one component of a comprehensive MRSA colonization surveillance program. It is not intended to diagnose MRSA infection nor to guide or monitor treatment for MRSA infections.    Studies/Results: Dg Chest 1 View  04/26/2015   CLINICAL DATA:  Post thoracentesis, cirrhosis with ascites and recurrent RIGHT pleural effusion  EXAM: CHEST  1 VIEW  COMPARISON:  Earlier exam of 04/26/2015  FINDINGS: Marked decrease in RIGHT pleural effusion and basilar atelectasis post thoracentesis.  No pneumothorax.  Heart remains enlarged.  Lungs otherwise clear.  IMPRESSION: Significantly decreased RIGHT pleural effusion and basilar atelectasis post thoracentesis without evidence of pneumothorax.  Patient reports symptomatic improvement in her breathing following the procedure.   Electronically Signed   By: Lavonia Dana M.D.   On: 04/26/2015 15:08   Dg Chest Port 1 View  04/26/2015   CLINICAL DATA:  Respiratory failure. Cough, occasionally productive. Subsequent encounter.  EXAM: PORTABLE CHEST - 1 VIEW  COMPARISON:  04/21/2015 radiographs.  FINDINGS: 0918 hours. There has been significant interval reaccumulation of the right pleural effusion following right-sided thoracentesis. This effusion appears moderate in volume with associated right basilar pulmonary opacity. There is no mediastinal shift or pneumothorax. The left lung is clear. The visualized mediastinum  appears unchanged. Surgical clips noted in the right axilla.  IMPRESSION: Interval reaccumulation of right-sided pleural effusion with associated right basilar atelectasis.   Electronically Signed   By:  Richardean Sale M.D.   On: 04/26/2015 09:33   US Thoracentesis Asp Pleural Space W/img Guide  04/26/2015   CLINICAL DATA:  Recurrent RIGHT pleural effusion, cirrhosis, ascites  EXAM: ULTRASOUND GUIDED THERAPEUTIC THORACENTESIS  COMPARISON:  04/21/2015  PROCEDURE: Procedure, benefits, and risks of procedure were discussed with patient.  Written informed consent for procedure was obtained.  Time out protocol followed.  Pleural effusion localized by ultrasound at the posterior RIGHT hemithorax.  Skin prepped and draped in usual sterile fashion.  Skin and soft tissues anesthetized with 10 mL of 1% lidocaine.  8 French thoracentesis catheter placed into the RIGHT pleural space.  At the midpoint of the procedure, the 8 [place] catheter became nonfunctional after a bout of coughing and was replaced by a 5 [place] Yueh catheter.  1800 mL of amber color fluid aspirated by syringe pump.  Procedure tolerated well by patient without immediate complication.  COMPLICATIONS: None  FINDINGS: As above  IMPRESSION: Successful ultrasound guided RIGHT thoracentesis yielding 1800 mL of pleural fluid.   Electronically Signed   By: Lavonia Dana M.D.   On: 04/26/2015 15:12    Medications: I have reviewed the patient's current medications.  Assesment:   Principal Problem:   GI bleed Active Problems:   Hypertension   Cirrhosis, alcoholic   Abdominal pain, left lower quadrant   BRBPR (bright red blood per rectum)   Sleep apnea   Lower extremity edema   ETOH abuse   Pleural effusion, right   Ascites   Anemia   Pleural effusion   Bleeding gastrointestinal metabolic encephalopathy, (alcohol VS hepatic) SOB and wheezing  Plan:  Medications reviewed Continue nebulizer Pulmonary consult appreciated As per pulmonary  recommendation.     LOS: 6 days   Savannah Jackson 04/27/2015, 8:11 AM

## 2015-04-28 ENCOUNTER — Inpatient Hospital Stay (HOSPITAL_COMMUNITY): Payer: Commercial Managed Care - HMO

## 2015-04-28 NOTE — Progress Notes (Signed)
Subjective: Patient is more alert and awake. Her breathing feels better. She had thoracentesis yesterday and about 1.8 liter fluid was removed. She had repeat chest x-ray this morning and result is pending.  Objective: Vital signs in last 24 hours: Temp:  [97.7 F (36.5 C)-98.9 F (37.2 C)] 97.7 F (36.5 C) (06/29 0533) Pulse Rate:  [64-92] 64 (06/29 0533) Resp:  [20] 20 (06/29 0533) BP: (133-149)/(71-99) 148/71 mmHg (06/29 0533) SpO2:  [92 %-97 %] 92 % (06/29 0533) Weight:  [80 kg (176 lb 5.9 oz)] 80 kg (176 lb 5.9 oz) (06/29 0533) Weight change: 0.303 kg (10.7 oz) Last BM Date: 04/26/15  Intake/Output from previous day:    PHYSICAL EXAM General appearance: alert and no distress Resp: diminished breath sounds bilaterally and rhonchi bilaterally Cardio: S1, S2 normal GI: distended, large ascites, bowel sound++ Extremities: extremities normal, atraumatic, no cyanosis or edema  Lab Results:  Results for orders placed or performed during the hospital encounter of 04/21/15 (from the past 48 hour(s))  Blood gas, arterial     Status: Abnormal   Collection Time: 04/26/15  9:39 AM  Result Value Ref Range   FIO2 0.21 %   pH, Arterial 7.432 7.350 - 7.450   pCO2 arterial 31.1 (L) 35.0 - 45.0 mmHg   pO2, Arterial 62.1 (L) 80.0 - 100.0 mmHg   Bicarbonate 20.4 20.0 - 24.0 mEq/L   TCO2 18.4 0 - 100 mmol/L   Acid-base deficit 2.6 (H) 0.0 - 2.0 mmol/L   O2 Saturation 92.3 %   Collection site RIGHT RADIAL    Drawn by COLLECTED BY RT    Sample type ARTERIAL    Allens test (pass/fail) PASS PASS  Protime-INR     Status: Abnormal   Collection Time: 04/27/15  6:34 AM  Result Value Ref Range   Prothrombin Time 19.2 (H) 11.6 - 15.2 seconds   INR 1.61 (H) 0.00 - 8.81  Basic metabolic panel     Status: Abnormal   Collection Time: 04/27/15  6:34 AM  Result Value Ref Range   Sodium 141 135 - 145 mmol/L   Potassium 3.5 3.5 - 5.1 mmol/L   Chloride 115 (H) 101 - 111 mmol/L   CO2 20 (L) 22 - 32  mmol/L   Glucose, Bld 96 65 - 99 mg/dL   BUN 29 (H) 6 - 20 mg/dL   Creatinine, Ser 1.47 (H) 0.44 - 1.00 mg/dL   Calcium 7.8 (L) 8.9 - 10.3 mg/dL   GFR calc non Af Amer 38 (L) >60 mL/min   GFR calc Af Amer 44 (L) >60 mL/min    Comment: (NOTE) The eGFR has been calculated using the CKD EPI equation. This calculation has not been validated in all clinical situations. eGFR's persistently <60 mL/min signify possible Chronic Kidney Disease.    Anion gap 6 5 - 15  Bilirubin, total     Status: Abnormal   Collection Time: 04/27/15  6:34 AM  Result Value Ref Range   Total Bilirubin 2.2 (H) 0.3 - 1.2 mg/dL  Ammonia     Status: Abnormal   Collection Time: 04/27/15  6:34 AM  Result Value Ref Range   Ammonia 38 (H) 9 - 35 umol/L    ABGS  Recent Labs  04/26/15 0939  PHART 7.432  PO2ART 62.1*  TCO2 18.4  HCO3 20.4   CULTURES Recent Results (from the past 240 hour(s))  MRSA PCR Screening     Status: None   Collection Time: 04/21/15  4:00 PM  Result Value Ref Range Status   MRSA by PCR NEGATIVE NEGATIVE Final    Comment:        The GeneXpert MRSA Assay (FDA approved for NASAL specimens only), is one component of a comprehensive MRSA colonization surveillance program. It is not intended to diagnose MRSA infection nor to guide or monitor treatment for MRSA infections.    Studies/Results: Dg Chest 1 View  04/26/2015   CLINICAL DATA:  Post thoracentesis, cirrhosis with ascites and recurrent RIGHT pleural effusion  EXAM: CHEST  1 VIEW  COMPARISON:  Earlier exam of 04/26/2015  FINDINGS: Marked decrease in RIGHT pleural effusion and basilar atelectasis post thoracentesis.  No pneumothorax.  Heart remains enlarged.  Lungs otherwise clear.  IMPRESSION: Significantly decreased RIGHT pleural effusion and basilar atelectasis post thoracentesis without evidence of pneumothorax.  Patient reports symptomatic improvement in her breathing following the procedure.   Electronically Signed   By: Ulyses Southward M.D.   On: 04/26/2015 15:08   Dg Chest Port 1 View  04/27/2015   CLINICAL DATA:  pleural effusion  EXAM: PORTABLE CHEST - 1 VIEW  COMPARISON:  the previous day's study  FINDINGS: Increase in moderately large right pleural effusion. Film is mismarked; correct laterality can be derived from comparison with previous studies. Low lung volumes. Increase in atelectasis/ consolidation at the right lung base. Surgical clips in the right axilla. Visualized skeletal structures are unremarkable.  IMPRESSION: 1. Increase in moderately large right pleural effusion.   Electronically Signed   By: Corlis Leak M.D.   On: 04/27/2015 09:10   Dg Chest Port 1 View  04/26/2015   CLINICAL DATA:  Respiratory failure. Cough, occasionally productive. Subsequent encounter.  EXAM: PORTABLE CHEST - 1 VIEW  COMPARISON:  04/21/2015 radiographs.  FINDINGS: 0918 hours. There has been significant interval reaccumulation of the right pleural effusion following right-sided thoracentesis. This effusion appears moderate in volume with associated right basilar pulmonary opacity. There is no mediastinal shift or pneumothorax. The left lung is clear. The visualized mediastinum appears unchanged. Surgical clips noted in the right axilla.  IMPRESSION: Interval reaccumulation of right-sided pleural effusion with associated right basilar atelectasis.   Electronically Signed   By: Carey Bullocks M.D.   On: 04/26/2015 09:33   US Thoracentesis Asp Pleural Space W/img Guide  04/26/2015   CLINICAL DATA:  Recurrent RIGHT pleural effusion, cirrhosis, ascites  EXAM: ULTRASOUND GUIDED THERAPEUTIC THORACENTESIS  COMPARISON:  04/21/2015  PROCEDURE: Procedure, benefits, and risks of procedure were discussed with patient.  Written informed consent for procedure was obtained.  Time out protocol followed.  Pleural effusion localized by ultrasound at the posterior RIGHT hemithorax.  Skin prepped and draped in usual sterile fashion.  Skin and soft tissues  anesthetized with 10 mL of 1% lidocaine.  8 French thoracentesis catheter placed into the RIGHT pleural space.  At the midpoint of the procedure, the 8 [place] catheter became nonfunctional after a bout of coughing and was replaced by a 5 [place] Yueh catheter.  1800 mL of amber color fluid aspirated by syringe pump.  Procedure tolerated well by patient without immediate complication.  COMPLICATIONS: None  FINDINGS: As above  IMPRESSION: Successful ultrasound guided RIGHT thoracentesis yielding 1800 mL of pleural fluid.   Electronically Signed   By: Ulyses Southward M.D.   On: 04/26/2015 15:12    Medications: I have reviewed the patient's current medications.  Assesment:   Principal Problem:   GI bleed Active Problems:   Hypertension   Cirrhosis, alcoholic  Abdominal pain, left lower quadrant   BRBPR (bright red blood per rectum)   Sleep apnea   Lower extremity edema   ETOH abuse   Pleural effusion, right   Ascites   Anemia   Pleural effusion   Bleeding gastrointestinal metabolic encephalopathy, (alcohol VS hepatic) SOB and wheezing Pleural Effusion  Plan:  Medications reviewed Continue nebulizer Will follow chest x-ray result As per pulmonary recommendation.     LOS: 7 days   Elizette Shek 04/28/2015, 7:31 AM

## 2015-04-28 NOTE — Progress Notes (Signed)
She seems to be doing better but she is reaccumulating her right pleural effusion. She seems to be tolerating it okay so far. I will reorder chest x-ray for in the morning. I think this is related to her decompensated cirrhosis ascites and probably a fistula. Since clinically she is doing okay and don't think she needs thoracentesis right now.

## 2015-04-28 NOTE — Progress Notes (Signed)
Patient stated she did not want to use CPAP machine tonight. Hospital unit still in room at this time.

## 2015-04-28 NOTE — Progress Notes (Signed)
  Subjective:  Patient has no complaints. She states she has good appetite. She has no difficulty ambulating in the room or going to the bathroom. She's had 3 bowel movements today. She denies melena or rectal bleeding. She denies shortness of breath or cough.   Objective: Blood pressure 117/71, pulse 56, temperature 98 F (36.7 C), temperature source Oral, resp. rate 20, height 5\' 3"  (1.6 m), weight 176 lb 5.9 oz (80 kg), SpO2 95 %. Patient is alert and does not have asterixis. Auscultation of lungs revealed diminished intensity of breath sounds at right base. Abdomen abdomen is full with easily palpable from liver and epigastric region without tenderness. No LE edema noted.  Labs/studies Results: No lab done today    Assessment:  #1. Fluid overload. No significant change in abdominal or lung exam since yesterday. #2. Hepatic encephalopathy. Clinically patient does not appear to be encephalopathic. #3. Alcoholic liver disease. Patient has alcoholic hepatitis on top of alcoholic cirrhosis. She was begun on prednisone yesterday since her HDF was 35. #4. GI bleed inactive.  Recommendations;  Will check CBC, metabolic 7 LFTs INR and serum ammonia in a.m.

## 2015-04-29 ENCOUNTER — Inpatient Hospital Stay (HOSPITAL_COMMUNITY): Payer: Commercial Managed Care - HMO

## 2015-04-29 LAB — HEPATIC FUNCTION PANEL
ALBUMIN: 2.2 g/dL — AB (ref 3.5–5.0)
ALT: 27 U/L (ref 14–54)
AST: 60 U/L — ABNORMAL HIGH (ref 15–41)
Alkaline Phosphatase: 152 U/L — ABNORMAL HIGH (ref 38–126)
BILIRUBIN INDIRECT: 1.1 mg/dL — AB (ref 0.3–0.9)
BILIRUBIN TOTAL: 1.6 mg/dL — AB (ref 0.3–1.2)
Bilirubin, Direct: 0.5 mg/dL (ref 0.1–0.5)
Total Protein: 5.1 g/dL — ABNORMAL LOW (ref 6.5–8.1)

## 2015-04-29 LAB — CBC
HCT: 32.6 % — ABNORMAL LOW (ref 36.0–46.0)
Hemoglobin: 10.8 g/dL — ABNORMAL LOW (ref 12.0–15.0)
MCH: 29.4 pg (ref 26.0–34.0)
MCHC: 33.1 g/dL (ref 30.0–36.0)
MCV: 88.8 fL (ref 78.0–100.0)
Platelets: 141 10*3/uL — ABNORMAL LOW (ref 150–400)
RBC: 3.67 MIL/uL — AB (ref 3.87–5.11)
RDW: 16.4 % — AB (ref 11.5–15.5)
WBC: 11.2 10*3/uL — ABNORMAL HIGH (ref 4.0–10.5)

## 2015-04-29 LAB — BASIC METABOLIC PANEL
Anion gap: 6 (ref 5–15)
BUN: 36 mg/dL — ABNORMAL HIGH (ref 6–20)
CO2: 21 mmol/L — AB (ref 22–32)
CREATININE: 1.39 mg/dL — AB (ref 0.44–1.00)
Calcium: 8 mg/dL — ABNORMAL LOW (ref 8.9–10.3)
Chloride: 114 mmol/L — ABNORMAL HIGH (ref 101–111)
GFR calc Af Amer: 47 mL/min — ABNORMAL LOW (ref >60)
GFR, EST NON AFRICAN AMERICAN: 41 mL/min — AB (ref >60)
Glucose, Bld: 94 mg/dL (ref 65–99)
POTASSIUM: 3.8 mmol/L (ref 3.5–5.1)
SODIUM: 141 mmol/L (ref 135–145)

## 2015-04-29 LAB — AMMONIA: Ammonia: 28 umol/L (ref 9–35)

## 2015-04-29 LAB — PROTIME-INR
INR: 1.49 (ref 0.00–1.49)
Prothrombin Time: 18.1 seconds — ABNORMAL HIGH (ref 11.6–15.2)

## 2015-04-29 MED ORDER — PANTOPRAZOLE SODIUM 40 MG PO TBEC: 40.0000 mg | DELAYED_RELEASE_TABLET | Freq: Every day | ORAL | Status: DC

## 2015-04-29 MED ORDER — ADULT MULTIVITAMIN W/MINERALS CH: 1.0000 | ORAL_TABLET | Freq: Every day | ORAL | Status: DC

## 2015-04-29 MED ORDER — FOLIC ACID 1 MG PO TABS: 1.0000 mg | ORAL_TABLET | Freq: Every day | ORAL | Status: DC

## 2015-04-29 MED ORDER — THIAMINE HCL 100 MG PO TABS: 100.0000 mg | ORAL_TABLET | Freq: Every day | ORAL | Status: DC

## 2015-04-29 MED ORDER — LACTULOSE 10 GM/15ML PO SOLN: 10.0000 g | Freq: Two times a day (BID) | ORAL | Status: DC

## 2015-04-29 MED ORDER — PREDNISONE 10 MG (21) PO TBPK: 10.0000 mg | ORAL_TABLET | Freq: Every day | ORAL | Status: DC

## 2015-04-29 NOTE — Progress Notes (Signed)
Pt's IV catheter removed and intact. Pt's IV site clean dry and intact. Discharge instructions and medications reviewed and discussed with patient. All questions answered and no further questions at this time. Pt in stable condition and in no acute distress at time of discharge. Pt escorted by nurse tech.

## 2015-04-29 NOTE — Care Management Note (Signed)
Case Management Note  Patient Details  Name: NAJAH MOALA MRN: [id number] Date of Birth: [birth year]  Subjective/Objective:                    Action/Plan:   Expected Discharge Date:  04/24/15               Expected Discharge Plan:  Home/Self Care  In-House Referral:  Chaplain, Clinical Social Work  Discharge planning Services  CM Consult  Post Acute Care Choice:  NA Choice offered to:  NA  DME Arranged:    DME Agency:     HH Arranged:    [place] Agency:     Status of Service:  Completed, signed off  Medicare Important Message Given:  Yes Date Medicare IM Given:  04/23/15 Medicare IM give by:  Jolene Provost, RN, MSN, CM  Date Additional Medicare IM Given:    Additional Medicare Important Message give by:     If discussed at [place] of Stay Meetings, dates discussed:    Additional Comments: Pt discharged home today. No CM needs noted. Pt does not qualify for home O2 at this time.  Christinia Gully [place], RN 04/29/2015, 8:59 AM

## 2015-04-29 NOTE — Care Management (Signed)
Important Message  Patient Details  Name: Savannah Jackson MRN: [id number] Date of Birth: [birth year]   Medicare Important Message Given:  Yes-third notification given    Joylene Draft, RN 04/29/2015, 8:59 AM

## 2015-04-29 NOTE — Discharge Summary (Signed)
Physician Discharge Summary  Patient ID: HADAS ECONOMOS MRN: [id number] DOB/AGE: 03-23-1956 59 y.o. Primary Care Physician:Flavius Repsher, MD Admit date: 04/21/2015 Discharge date: 04/29/2015    Discharge Diagnoses:   Principal Problem:   GI bleed Active Problems:   Hypertension   Cirrhosis, alcoholic   Abdominal pain, left lower quadrant   BRBPR (bright red blood per rectum)   Sleep apnea   Lower extremity edema   ETOH abuse   Pleural effusion, right   Ascites   Anemia   Pleural effusion   Bleeding gastrointestinal     Medication List    STOP taking these medications        diphenhydramine-acetaminophen 25-500 MG Tabs  Commonly known as:  TYLENOL PM     guaiFENesin 600 MG 12 hr tablet  Commonly known as:  MUCINEX      TAKE these medications        escitalopram 10 MG tablet  Commonly known as:  LEXAPRO  Take 1 tablet by mouth daily.     folic acid 1 MG tablet  Commonly known as:  FOLVITE  Take 1 tablet (1 mg total) by mouth daily.     furosemide 40 MG tablet  Commonly known as:  LASIX  Take 1 tablet by mouth daily.     lactulose 10 GM/15ML solution  Commonly known as:  CHRONULAC  Take 15 mLs (10 g total) by mouth 2 (two) times daily.     lisinopril 20 MG tablet  Commonly known as:  PRINIVIL,ZESTRIL  Take 1 tablet (20 mg total) by mouth daily.     metoprolol 50 MG tablet  Commonly known as:  LOPRESSOR  Take 50 mg by mouth 2 (two) times daily.     multivitamin with minerals Tabs tablet  Take 1 tablet by mouth daily.     pantoprazole 40 MG tablet  Commonly known as:  PROTONIX  Take 1 tablet (40 mg total) by mouth daily.     Potassium Chloride ER 20 MEQ Tbcr  Take 20 mEq by mouth 2 (two) times daily.     predniSONE 10 MG (21) Tbpk tablet  Commonly known as:  STERAPRED UNI-PAK 21 TAB  Take 1 tablet (10 mg total) by mouth daily. 30 mg po daily for 3 days, 20 mg po daily for 3 days, 10 mg po daily for 3 days then stop     spironolactone 25 MG  tablet  Commonly known as:  ALDACTONE  Take 1 tablet by mouth daily.     thiamine 100 MG tablet  Take 1 tablet (100 mg total) by mouth daily.        Discharged Condition:  improved    Consults: GI and pulmonary  Significant Diagnostic Studies: Dg Chest 1 View  04/26/2015   CLINICAL DATA:  Post thoracentesis, cirrhosis with ascites and recurrent RIGHT pleural effusion  EXAM: CHEST  1 VIEW  COMPARISON:  Earlier exam of 04/26/2015  FINDINGS: Marked decrease in RIGHT pleural effusion and basilar atelectasis post thoracentesis.  No pneumothorax.  Heart remains enlarged.  Lungs otherwise clear.  IMPRESSION: Significantly decreased RIGHT pleural effusion and basilar atelectasis post thoracentesis without evidence of pneumothorax.  Patient reports symptomatic improvement in her breathing following the procedure.   Electronically Signed   By: Lavonia Dana M.D.   On: 04/26/2015 15:08   Dg Chest 1 View  04/21/2015   CLINICAL DATA:  Post RIGHT thoracentesis  EXAM: CHEST  1 VIEW  COMPARISON:  Portable exam 1716 hours compared to  1317 hours  FINDINGS: Marked decrease in RIGHT pleural effusion post thoracentesis.  No pneumothorax.  Mild residual RIGHT basilar atelectasis and pleural effusion.  Cardiac silhouette appears enlarged with slight vascular congestion.  No pulmonary infiltrates.  IMPRESSION: Marked decrease in RIGHT pleural effusion and basilar atelectasis post thoracentesis without pneumothorax.   Electronically Signed   By: Lavonia Dana M.D.   On: 04/21/2015 17:33   Dg Chest 1 View  04/21/2015   CLINICAL DATA:  Rectal bleeding  EXAM: CHEST  1 VIEW  COMPARISON:  01/06/2015  FINDINGS: Right pleural effusion increased. There is now shift of the mediastinum to the left. Left lung is grossly clear. Heart is normal in size. No pneumothorax.  IMPRESSION: Increasing right pleural effusion resulting in mediastinal shift to the left.   Electronically Signed   By: Marybelle Killings M.D.   On: 04/21/2015 13:42    Ct Abdomen Pelvis W Contrast  04/21/2015   CLINICAL DATA:  Severe generalized abdominal pain. Hematemesis and rectal bleeding. nausea and vomiting. Cirrhosis.  EXAM: CT ABDOMEN AND PELVIS WITH CONTRAST  TECHNIQUE: Multidetector CT imaging of the abdomen and pelvis was performed using the standard protocol following bolus administration of intravenous contrast.  CONTRAST:  196mL OMNIPAQUE IOHEXOL 300 MG/ML SOLN, 43mL OMNIPAQUE IOHEXOL 300 MG/ML SOLN  COMPARISON:  10/25/2013  FINDINGS: Lower Chest: New moderate to large right pleural effusion and right lower lobe atelectasis demonstrated.  Hepatobiliary: Hepatic cirrhosis again demonstrated, however no liver masses are identified. Prior cholecystectomy noted. No evidence of biliary dilatation.  Pancreas: No mass, inflammatory changes, or other significant abnormality identified.  Spleen:  Within normal limits in size and appearance.  Adrenals:  No masses identified.  Kidneys/Urinary Tract:  No evidence of masses or hydronephrosis.  Stomach/Bowel/Peritoneum: Moderate ascites as well as diffuse body wall and mesenteric edema shows no significant interval change. Diffuse colonic diverticulosis is seen, without evidence of focal diverticulitis. There is mild diffuse small bowel and colonic wall thickening, likely due to hypoalbuminemia in the setting of cirrhosis. No evidence of dilated bowel loops or bowel obstruction. No focal inflammatory process or abscess identified.  Vascular/Lymphatic: No pathologically enlarged lymph nodes identified. No abdominal aortic aneurysm or other significant retroperitoneal abnormality demonstrated. Portal and splenic veins remain patent.  Reproductive: Multiple uterine fibroids again seen, largest measuring approximately 4 cm. Adnexal regions are unremarkable.  Other:  None.  Musculoskeletal:  No suspicious bone lesions identified.  IMPRESSION: Hepatic cirrhosis with moderate ascites and diffuse mesenteric and body wall edema,  without significant change compared to prior exam. No evidence of hepatic neoplasm.  Mild diffuse small bowel and colonic wall thickening, likely due to hypoalbuminemia. Diverticulosis noted, without evidence of diverticulitis or other focal inflammatory process.  New moderate to large right pleural effusion and right lower lung atelectasis.  Uterine fibroids, largest measuring approximately 4 cm.   Electronically Signed   By: Earle Gell M.D.   On: 04/21/2015 13:36   Dg Chest Port 1 View  04/29/2015   CLINICAL DATA:  Pleural effusion, followup  EXAM: PORTABLE CHEST - 1 VIEW  COMPARISON:  Portable chest x-ray of 04/28/2015  FINDINGS: There is slight worsening of opacity at the right lung base most consistent with increasing effusion and right basilar atelectasis. Pneumonia cannot be excluded. The left lung is clear. Mild cardiomegaly is stable.  IMPRESSION: Increasing opacity at the right lung base most consistent with increasing right pleural effusion and/or atelectasis. Cannot exclude pneumonia.   Electronically Signed   By: Eddie Dibbles  Alvester Chou M.D.   On: 04/29/2015 08:00   Dg Chest Port 1 View  04/28/2015   CLINICAL DATA:  Pleural effusion.  EXAM: PORTABLE CHEST - 1 VIEW  COMPARISON:  One-view chest 04/27/2015 and 04/26/2015.  FINDINGS: The right pleural effusion continues to accumulate with some progression. Right basilar airspace disease is associated. The heart size is normal. The left lung is clear. The visualized soft tissues and bony thorax are unremarkable.  IMPRESSION: Progressive accumulation of right pleural effusion and associated airspace disease.   Electronically Signed   By: San Morelle M.D.   On: 04/28/2015 07:55   Dg Chest Port 1 View  04/27/2015   CLINICAL DATA:  pleural effusion  EXAM: PORTABLE CHEST - 1 VIEW  COMPARISON:  the previous day's study  FINDINGS: Increase in moderately large right pleural effusion. Film is mismarked; correct laterality can be derived from comparison with  previous studies. Low lung volumes. Increase in atelectasis/ consolidation at the right lung base. Surgical clips in the right axilla. Visualized skeletal structures are unremarkable.  IMPRESSION: 1. Increase in moderately large right pleural effusion.   Electronically Signed   By: Lucrezia Europe M.D.   On: 04/27/2015 09:10   Dg Chest Port 1 View  04/26/2015   CLINICAL DATA:  Respiratory failure. Cough, occasionally productive. Subsequent encounter.  EXAM: PORTABLE CHEST - 1 VIEW  COMPARISON:  04/21/2015 radiographs.  FINDINGS: 0918 hours. There has been significant interval reaccumulation of the right pleural effusion following right-sided thoracentesis. This effusion appears moderate in volume with associated right basilar pulmonary opacity. There is no mediastinal shift or pneumothorax. The left lung is clear. The visualized mediastinum appears unchanged. Surgical clips noted in the right axilla.  IMPRESSION: Interval reaccumulation of right-sided pleural effusion with associated right basilar atelectasis.   Electronically Signed   By: Richardean Sale M.D.   On: 04/26/2015 09:33   US Thoracentesis Asp Pleural Space W/img Guide  04/26/2015   CLINICAL DATA:  Recurrent RIGHT pleural effusion, cirrhosis, ascites  EXAM: ULTRASOUND GUIDED THERAPEUTIC THORACENTESIS  COMPARISON:  04/21/2015  PROCEDURE: Procedure, benefits, and risks of procedure were discussed with patient.  Written informed consent for procedure was obtained.  Time out protocol followed.  Pleural effusion localized by ultrasound at the posterior RIGHT hemithorax.  Skin prepped and draped in usual sterile fashion.  Skin and soft tissues anesthetized with 10 mL of 1% lidocaine.  8 French thoracentesis catheter placed into the RIGHT pleural space.  At the midpoint of the procedure, the 8 [place] catheter became nonfunctional after a bout of coughing and was replaced by a 5 [place] Yueh catheter.  1800 mL of amber color fluid aspirated by syringe pump.   Procedure tolerated well by patient without immediate complication.  COMPLICATIONS: None  FINDINGS: As above  IMPRESSION: Successful ultrasound guided RIGHT thoracentesis yielding 1800 mL of pleural fluid.   Electronically Signed   By: Lavonia Dana M.D.   On: 04/26/2015 15:12   US Thoracentesis Asp Pleural Space W/img Guide  04/21/2015   CLINICAL DATA:  RIGHT pleural effusion, ascites, cirrhosis  EXAM: ULTRASOUND GUIDED DIAGNOSTIC AND THERAPEUTIC THORACENTESIS  COMPARISON:  None  PROCEDURE: Procedure, benefits, and risks of procedure were discussed with patient.  Written informed consent for procedure was obtained.  Time out protocol followed.  Pleural effusion localized by ultrasound at the posterior RIGHT hemithorax.  Skin prepped and draped in usual sterile fashion.  Skin and soft tissues anesthetized with 10 mL of 1% lidocaine.  8 French thoracentesis catheter  placed into the RIGHT pleural space.  1700 mL of clear light yellow fluid aspirated by syringe pump.  Procedure tolerated well by patient without immediate complication.  COMPLICATIONS: None  FINDINGS: A total of approximately 1700 mL of RIGHT pleural fluid was removed. A fluid sample of 180 mL was sent for laboratory analysis.  IMPRESSION: Successful ultrasound guided RIGHT thoracentesis yielding 1700 mL of pleural fluid.   Electronically Signed   By: Lavonia Dana M.D.   On: 04/21/2015 17:18    Lab Results: Basic Metabolic Panel:  Recent Labs  04/27/15 0634 04/29/15 0608  NA 141 141  K 3.5 3.8  CL 115* 114*  CO2 20* 21*  GLUCOSE 96 94  BUN 29* 36*  CREATININE 1.47* 1.39*  CALCIUM 7.8* 8.0*   Liver Function Tests:  Recent Labs  04/27/15 0634 04/29/15 0608  AST  --  60*  ALT  --  27  ALKPHOS  --  152*  BILITOT 2.2* 1.6*  PROT  --  5.1*  ALBUMIN  --  2.2*     CBC:  Recent Labs  04/29/15 0608  WBC 11.2*  HGB 10.8*  HCT 32.6*  MCV 88.8  PLT 141*    Recent Results (from the past 240 hour(s))  MRSA PCR Screening      Status: None   Collection Time: 04/21/15  4:00 PM  Result Value Ref Range Status   MRSA by PCR NEGATIVE NEGATIVE Final    Comment:        The GeneXpert MRSA Assay (FDA approved for NASAL specimens only), is one component of a comprehensive MRSA colonization surveillance program. It is not intended to diagnose MRSA infection nor to guide or monitor treatment for MRSA infections.      Hospital Course:   This is a 58 years old female with history of alcoholic liver cirrhosis was admitted due to GI bleed. She had also large ascites and change in mental status. Patient was seen by GI and endoscopy was done. Her GI bleeding stopped but patient developed shortness of breath due to pleural effusion. She was seen by pulmonary and thoracentesis was done. Patient over all improved and being discharged in stable condition.  Discharge Exam: Blood pressure 124/99, pulse 58, temperature 98.1 F (36.7 C), temperature source Oral, resp. rate 20, height 5\' 3"  (1.6 m), weight 78.427 kg (172 lb 14.4 oz), SpO2 96 %.   Disposition:  home      Signed: Yuki Brunsman   04/29/2015, 8:04 AM

## 2015-05-05 ENCOUNTER — Other Ambulatory Visit (INDEPENDENT_AMBULATORY_CARE_PROVIDER_SITE_OTHER): Payer: Self-pay | Admitting: Internal Medicine

## 2015-05-05 DIAGNOSIS — R188 Other ascites: Secondary | ICD-10-CM

## 2015-05-05 DIAGNOSIS — J90 Pleural effusion, not elsewhere classified: Secondary | ICD-10-CM

## 2015-05-07 ENCOUNTER — Ambulatory Visit (HOSPITAL_COMMUNITY): Payer: Commercial Managed Care - HMO

## 2015-05-07 ENCOUNTER — Ambulatory Visit (HOSPITAL_COMMUNITY)
Admission: RE | Admit: 2015-05-07 | Discharge: 2015-05-07 | Disposition: A | Discharge status: Home / Self Care | Payer: Commercial Managed Care - HMO | Source: Ambulatory Visit | Attending: Internal Medicine | Admitting: Internal Medicine

## 2015-05-07 ENCOUNTER — Encounter (HOSPITAL_COMMUNITY): Payer: Self-pay

## 2015-05-07 DIAGNOSIS — R0602 Shortness of breath: Secondary | ICD-10-CM | POA: Diagnosis not present

## 2015-05-07 DIAGNOSIS — J90 Pleural effusion, not elsewhere classified: Secondary | ICD-10-CM

## 2015-05-07 DIAGNOSIS — R188 Other ascites: Secondary | ICD-10-CM

## 2015-05-07 NOTE — Sedation Documentation (Addendum)
Approx 1900 clear pale yellow fluid removed from pleural space. Pt tolerated procedure well.

## 2015-05-07 NOTE — Discharge Instructions (Signed)
Thoracentesis  A thoracentesis procedure is done to remove fluid that has built up in the space between your chest wall and your lungs (pleural space). Although you always have a small amount of fluid in the pleural space, some medical conditions, such as heart failure, can create too much fluid. This extra fluid is removed using a needle inserted through the skin and tissue and into the pleural space.   A thoracentesis may be done to:  · Understand why there is extra fluid and create the treatment plan that is right for you.  · Help get rid of shortness of breath, discomfort, or pain caused by the extra fluid.  LET YOUR HEALTH CARE PROVIDER KNOW ABOUT:  · Any allergies you have.  · All medicines you are taking, including vitamins, herbs, eye drops, creams, and over-the-counter medicines. Be sure to mention any use of steroids, either by mouth or cream.  · Previous problems you or members of your family have had with the use of anesthetics.  · Possibility of pregnancy, if this applies.  · Any blood disorders you have, including a history of blood clots.  · Previous surgeries you have had.  · Medical conditions you have.  RISKS AND COMPLICATIONS  Generally, this is a safe procedure. However, problems can occur and include:   · Injury to the lung.  · Possible infection.  · Possible collapse of a lung.  BEFORE THE PROCEDURE  · Ask your health care provider if you need to arrive early before your procedure.  · Inform your health care provider if you have had a frequent cough. If you have had frequent coughing episodes, your health care provider may want you to take a cough suppressant.  · You may have a chest X-ray to determine the location and amount of fluid in the pleural space.  · Ask your health care provider about:  ¨ Changing or stopping your regular medicines. This is especially important if you are taking diabetes medicines or blood thinners.  ¨ Taking medicines such as aspirin and ibuprofen. These medicines  can thin your blood. Do not take these medicines before your procedure if your health care provider asks you not to.  PROCEDURE  · You may be asked to sit upright and lean slightly forward for the procedure.  · An area of your back will be cleaned and disinfected.  · A numbing medicine (local anesthetic) may then be injected into the skin and tissue.  · A needle will be inserted between your ribs and advanced into the pleural space. You may feel pressure or slight pain as the needle is positioned into the pleural space.  · Fluid will be removed from the pleural space through the needle. You may feel pressure as the fluid is removed.  · The needle will be withdrawn once the excess fluid has been removed. A sample of the fluid may be sent for examination.  · The puncture site may be covered with a bandage (dressing).  AFTER THE PROCEDURE  · A chest X-ray may be done.  · Your recovery will be assessed and monitored. If there are no problems, you should be able to go home shortly after the procedure.  · Ask your health care provider when your test results will be ready. Make sure to get your test results.  · Follow your health care provider's instructions on dressing changes and removal.  HOME CARE INSTRUCTIONS   · You may resume your normal diet and activities as   directed by your health care provider.  · Take medicines only as directed by your health care provider.  SEEK MEDICAL CARE IF:   · You have drainage, redness, swelling, or pain at your puncture site.  · You have a fever.  SEEK IMMEDIATE MEDICAL CARE IF:   You have shortness of breath or chest pain.   Document Released: 05/01/2005 Document Revised: 03/02/2014 Document Reviewed: 08/13/2008  [employer]® Patient Information ©2015 [employer]. This information is not intended to replace advice given to you by your health care provider. Make sure you discuss any questions you have with your health care provider.

## 2015-05-13 ENCOUNTER — Ambulatory Visit (INDEPENDENT_AMBULATORY_CARE_PROVIDER_SITE_OTHER): Payer: Medicare HMO | Admitting: Internal Medicine

## 2015-05-13 ENCOUNTER — Encounter (INDEPENDENT_AMBULATORY_CARE_PROVIDER_SITE_OTHER): Payer: Self-pay | Admitting: Internal Medicine

## 2015-05-13 ENCOUNTER — Encounter (INDEPENDENT_AMBULATORY_CARE_PROVIDER_SITE_OTHER): Payer: Self-pay | Admitting: *Deleted

## 2015-05-13 VITALS — BP 110/86 | HR 56 | Temp 98.3°F | Ht 63.0 in | Wt 173.6 lb

## 2015-05-13 DIAGNOSIS — K7290 Hepatic failure, unspecified without coma: Secondary | ICD-10-CM

## 2015-05-13 DIAGNOSIS — J948 Other specified pleural conditions: Secondary | ICD-10-CM | POA: Diagnosis not present

## 2015-05-13 DIAGNOSIS — K7030 Alcoholic cirrhosis of liver without ascites: Secondary | ICD-10-CM | POA: Diagnosis not present

## 2015-05-13 DIAGNOSIS — K7031 Alcoholic cirrhosis of liver with ascites: Secondary | ICD-10-CM

## 2015-05-13 DIAGNOSIS — D638 Anemia in other chronic diseases classified elsewhere: Secondary | ICD-10-CM | POA: Diagnosis not present

## 2015-05-13 DIAGNOSIS — J90 Pleural effusion, not elsewhere classified: Secondary | ICD-10-CM

## 2015-05-13 DIAGNOSIS — K7682 Hepatic encephalopathy: Secondary | ICD-10-CM

## 2015-05-13 MED ORDER — POTASSIUM CHLORIDE ER 20 MEQ PO TBCR: 20.0000 meq | EXTENDED_RELEASE_TABLET | Freq: Every day | ORAL | Status: DC

## 2015-05-13 MED ORDER — SPIRONOLACTONE 25 MG PO TABS: 50.0000 mg | ORAL_TABLET | Freq: Two times a day (BID) | ORAL | Status: DC

## 2015-05-13 NOTE — Patient Instructions (Signed)
Decrease prednisone to 10 mg by mouth every morning for 1 week and then 5 mg daily for 1 week and stopped. Limit by mouth fluid intake to 50 ounces per day. Thoracenteses to be scheduled. Physician will call with results of blood work when completed

## 2015-05-13 NOTE — Progress Notes (Signed)
Presenting complaint;  Follow-up for decompensated alcoholic liver disease.  Database;  Patient is 59 year old African-American female who is here for scheduled visit accompanied by her daughter [name]. She was discharged from the hospital 2 weeks ago. She was admitted with upper GI bleed anemia right pleural effusion ascites as well as alcoholic hepatitis and hepatic encephalopathy. Last week she had thoracentesis.  Subjective;  She says her appetite is improved. She denies nausea vomiting melena or rectal bleeding. She is having at least 3 bowel movements per day. Over the last 2-3 days she has noted difficulty breathing with minimal exertion. She also starts coughing when she lies flat. She denies fever chills or chest pain. He has not had any alcohol since she left the hospital. She also feels her abdomen is more distended. According to patient's daughter she has not been confused anymore.    Current Medications: Outpatient Encounter Prescriptions as of 05/13/2015  Medication Sig  . escitalopram (LEXAPRO) 10 MG tablet Take 1 tablet by mouth daily.  . folic acid (FOLVITE) 1 MG tablet Take 1 tablet (1 mg total) by mouth daily.  . furosemide (LASIX) 40 MG tablet Take 1 tablet by mouth daily.  Marland Kitchen lactulose (CHRONULAC) 10 GM/15ML solution Take 15 mLs (10 g total) by mouth 2 (two) times daily.  Marland Kitchen lisinopril (PRINIVIL,ZESTRIL) 20 MG tablet Take 1 tablet (20 mg total) by mouth daily.  . metoprolol (LOPRESSOR) 50 MG tablet Take 50 mg by mouth 2 (two) times daily.  . Multiple Vitamin (MULTIVITAMIN WITH MINERALS) TABS tablet Take 1 tablet by mouth daily.  . pantoprazole (PROTONIX) 40 MG tablet Take 1 tablet (40 mg total) by mouth daily.  . potassium chloride 20 MEQ TBCR Take 20 mEq by mouth 2 (two) times daily.  . predniSONE (STERAPRED UNI-PAK 21 TAB) 10 MG (21) TBPK tablet Take 1 tablet (10 mg total) by mouth daily. 30 mg po daily for 3 days, 20 mg po daily for 3 days, 10 mg po daily for 3 days  then stop  . spironolactone (ALDACTONE) 25 MG tablet Take 1 tablet by mouth daily.  Marland Kitchen thiamine 100 MG tablet Take 1 tablet (100 mg total) by mouth daily.   No facility-administered encounter medications on file as of 05/13/2015.     Objective: Blood pressure 110/86, pulse 56, temperature 98.3 F (36.8 C), height 5\' 3"  (1.6 m), weight 173 lb 9.6 oz (78.744 kg). Patient is alert and does not have asterixis. Conjunctiva is pink. Sclera is nonicteric Oropharyngeal mucosa is normal. No neck masses or thyromegaly noted. Cardiac exam with regular rhythm normal S1 and S2. No murmur or gallop noted. Breath sounds are absent at right base. Elsewhere breath sounds are vesicular. Abdomen is full but not tense or tender.  She has 1-2+ pitting edema involving both legs.   Assessment:  #1. 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 She will need to be tapped again. #2. Decompensated alcoholic cirrhosis. She is due for repeat blood work. #3. Anemia predominantly due to chronic disease.   Plan:  Patient will go to the lab for CBC, comprehensive chemistry panel and INR. Thoracentesis to be arranged. Decrease KCl to 20 mg by mouth daily. Increase spironolactone to 50 mg by mouth twice a day. Limit by mouth fluid intake to 50 ounces per day

## 2015-05-14 ENCOUNTER — Encounter (HOSPITAL_COMMUNITY): Payer: Self-pay

## 2015-05-14 ENCOUNTER — Ambulatory Visit (HOSPITAL_COMMUNITY)
Admission: RE | Admit: 2015-05-14 | Discharge: 2015-05-14 | Disposition: A | Discharge status: Home / Self Care | Payer: Commercial Managed Care - HMO | Source: Ambulatory Visit | Attending: Internal Medicine | Admitting: Internal Medicine

## 2015-05-14 DIAGNOSIS — Z9889 Other specified postprocedural states: Secondary | ICD-10-CM

## 2015-05-14 DIAGNOSIS — J90 Pleural effusion, not elsewhere classified: Secondary | ICD-10-CM | POA: Insufficient documentation

## 2015-05-14 LAB — CBC
HCT: 34.6 % — ABNORMAL LOW (ref 36.0–46.0)
Hemoglobin: 11.1 g/dL — ABNORMAL LOW (ref 12.0–15.0)
MCH: 28.4 pg (ref 26.0–34.0)
MCHC: 32.1 g/dL (ref 30.0–36.0)
MCV: 88.5 fL (ref 78.0–100.0)
MPV: 10.7 fL (ref 8.6–12.4)
PLATELETS: 236 10*3/uL (ref 150–400)
RBC: 3.91 MIL/uL (ref 3.87–5.11)
RDW: 15.9 % — ABNORMAL HIGH (ref 11.5–15.5)
WBC: 8.3 10*3/uL (ref 4.0–10.5)

## 2015-05-14 LAB — COMPREHENSIVE METABOLIC PANEL
ALK PHOS: 160 U/L — AB (ref 39–117)
ALT: 33 U/L (ref 0–35)
AST: 48 U/L — ABNORMAL HIGH (ref 0–37)
Albumin: 2 g/dL — ABNORMAL LOW (ref 3.5–5.2)
BUN: 22 mg/dL (ref 6–23)
CO2: 20 meq/L (ref 19–32)
Calcium: 8 mg/dL — ABNORMAL LOW (ref 8.4–10.5)
Chloride: 112 mEq/L (ref 96–112)
Creat: 0.9 mg/dL (ref 0.50–1.10)
Glucose, Bld: 93 mg/dL (ref 70–99)
Potassium: 5.3 mEq/L (ref 3.5–5.3)
Sodium: 142 mEq/L (ref 135–145)
TOTAL PROTEIN: 4.8 g/dL — AB (ref 6.0–8.3)
Total Bilirubin: 1.3 mg/dL — ABNORMAL HIGH (ref 0.2–1.2)

## 2015-05-14 LAB — PROTIME-INR
INR: 1.36 (ref <1.50)
Prothrombin Time: 16.8 seconds — ABNORMAL HIGH (ref 11.6–15.2)

## 2015-05-14 NOTE — Progress Notes (Signed)
Thoracentesis complete no signs of distress. 1800 ml yellow colored pleural fluid removed.

## 2015-05-14 NOTE — Procedures (Signed)
CLINICAL DATA: [Pleural effusion, right.]  EXAM: [US THORACENTESIS ASP PLEURAL SPACE W/IMG GUIDE]  TECHNIQUE: [Ultrasound was utilized to visualize the pleural effusion.]  COMPARISON: [05/07/2015]  PROCEDURE:  I discussed the risks (including hemorrhage, infection, post-expansion edema, and pneumothorax, among others), benefits, and alternatives to the procedure with the patient.  We specifically discussed the high technical likelihood of success of the procedure.  The patient understood and elected to undergo the procedure.      Standard time-out was employed.  A suitable location for right thoracentesis was decided using ultrasound and the skin marked.  Following sterile skin prep and local anesthetic administration consisting of 1% lidocaine, and after making a tiny incision at the appropriate right posterior thoracic intervertebral site, a 7 [place] Yueh catheter was advanced into the pleural space over a posterior rib without difficulty.  Straw-colored pleural fluid was returned.  The needle was withdrawn and the catheter advanced into the pleural space.  The catheter was connected to suction bottles and a total of 1850 cc of straw-colored fluid was removed.  The catheter was subsequently removed and the skin cleansed and bandaged.  The patient experienced mild transient coughing but no significant complication was observed.      A postprocedural chest radiograph was obtained, without significant pneumothorax.     IMPRESSION: [ Technically successful right-sided thoracentesis yielding 1850 cc of straw-colored fluid.  No pneumothorax or significant complicating feature observed.   ]

## 2015-05-17 DIAGNOSIS — K7031 Alcoholic cirrhosis of liver with ascites: Secondary | ICD-10-CM | POA: Diagnosis not present

## 2015-05-17 DIAGNOSIS — J90 Pleural effusion, not elsewhere classified: Secondary | ICD-10-CM | POA: Diagnosis not present

## 2015-05-17 DIAGNOSIS — R601 Generalized edema: Secondary | ICD-10-CM | POA: Diagnosis not present

## 2015-05-18 ENCOUNTER — Telehealth (INDEPENDENT_AMBULATORY_CARE_PROVIDER_SITE_OTHER): Payer: Self-pay | Admitting: *Deleted

## 2015-05-18 NOTE — Telephone Encounter (Signed)
LM stating she still has a lot of fluid and short of breath. Would like to know what to do? The return phone number is [phone].

## 2015-05-18 NOTE — Telephone Encounter (Signed)
Per Dr.Rehman if the patient is very short of breathe she should go to the Ed. If not, we will arrange an abdominal tap . Followed by 50 mg Albumin daily for 3 days , then Lasix 40 mg for 3 days. Patient was called and line was busy. We will try and call her in the morning.

## 2015-05-19 ENCOUNTER — Ambulatory Visit (HOSPITAL_COMMUNITY)
Admission: RE | Admit: 2015-05-19 | Discharge: 2015-05-19 | Disposition: A | Discharge status: Home / Self Care | Payer: Commercial Managed Care - HMO | Source: Ambulatory Visit | Attending: Internal Medicine | Admitting: Internal Medicine

## 2015-05-19 ENCOUNTER — Other Ambulatory Visit (INDEPENDENT_AMBULATORY_CARE_PROVIDER_SITE_OTHER): Payer: Self-pay | Admitting: Internal Medicine

## 2015-05-19 DIAGNOSIS — J90 Pleural effusion, not elsewhere classified: Secondary | ICD-10-CM | POA: Diagnosis not present

## 2015-05-19 DIAGNOSIS — Z9889 Other specified postprocedural states: Secondary | ICD-10-CM | POA: Insufficient documentation

## 2015-05-19 DIAGNOSIS — K7460 Unspecified cirrhosis of liver: Secondary | ICD-10-CM | POA: Insufficient documentation

## 2015-05-19 DIAGNOSIS — J948 Other specified pleural conditions: Secondary | ICD-10-CM | POA: Insufficient documentation

## 2015-05-19 MED ORDER — ALBUMIN HUMAN 25 % IV SOLN
INTRAVENOUS | Status: AC
  Administered 2015-05-19: 50 g via INTRAVENOUS
  Filled 2015-05-19: qty 200

## 2015-05-19 MED ORDER — ALBUMIN HUMAN 25 % IV SOLN
50.0000 g | Freq: Once | INTRAVENOUS | Status: AC
  Administered 2015-05-19: 50 g via INTRAVENOUS

## 2015-05-19 NOTE — Telephone Encounter (Signed)
Dr.Rehman spoke with the patient last evening. He told her that this would be arranged for this morning. Patient was advised that we would call her with the instructions as soon as it was arranged.

## 2015-05-19 NOTE — Progress Notes (Signed)
Thoracentesis complete no signs of distress. 1600 ml yellow colored pleural fluid removed.

## 2015-05-19 NOTE — Telephone Encounter (Signed)
Per Dr Laural Golden, patient needs thoracentesis, she will have today and she is aware

## 2015-05-20 ENCOUNTER — Encounter (HOSPITAL_COMMUNITY)
Admission: RE | Admit: 2015-05-20 | Discharge: 2015-05-20 | Disposition: A | Discharge status: Home / Self Care | Payer: Commercial Managed Care - HMO | Source: Ambulatory Visit | Attending: Internal Medicine | Admitting: Internal Medicine

## 2015-05-20 DIAGNOSIS — K7031 Alcoholic cirrhosis of liver with ascites: Secondary | ICD-10-CM | POA: Diagnosis not present

## 2015-05-20 MED ORDER — FUROSEMIDE 10 MG/ML IJ SOLN
40.0000 mg | Freq: Once | INTRAMUSCULAR | Status: AC
  Administered 2015-05-20: 40 mg via INTRAVENOUS

## 2015-05-20 MED ORDER — ALBUMIN HUMAN 25 % IV SOLN
50.0000 g | Freq: Once | INTRAVENOUS | Status: AC
  Administered 2015-05-20: 50 g via INTRAVENOUS
  Filled 2015-05-20: qty 200

## 2015-05-21 ENCOUNTER — Encounter (HOSPITAL_COMMUNITY)
Admission: RE | Admit: 2015-05-21 | Discharge: 2015-05-21 | Disposition: A | Discharge status: Home / Self Care | Payer: Commercial Managed Care - HMO | Source: Ambulatory Visit | Attending: Internal Medicine | Admitting: Internal Medicine

## 2015-05-21 ENCOUNTER — Telehealth (INDEPENDENT_AMBULATORY_CARE_PROVIDER_SITE_OTHER): Payer: Self-pay | Admitting: *Deleted

## 2015-05-21 DIAGNOSIS — K7031 Alcoholic cirrhosis of liver with ascites: Secondary | ICD-10-CM | POA: Diagnosis not present

## 2015-05-21 LAB — BASIC METABOLIC PANEL
ANION GAP: 5 (ref 5–15)
BUN: 30 mg/dL — AB (ref 6–20)
CALCIUM: 8.4 mg/dL — AB (ref 8.9–10.3)
CO2: 23 mmol/L (ref 22–32)
Chloride: 115 mmol/L — ABNORMAL HIGH (ref 101–111)
Creatinine, Ser: 1.16 mg/dL — ABNORMAL HIGH (ref 0.44–1.00)
GFR calc Af Amer: 59 mL/min — ABNORMAL LOW (ref >60)
GFR calc non Af Amer: 50 mL/min — ABNORMAL LOW (ref >60)
GLUCOSE: 123 mg/dL — AB (ref 65–99)
POTASSIUM: 4.1 mmol/L (ref 3.5–5.1)
Sodium: 143 mmol/L (ref 135–145)

## 2015-05-21 MED ORDER — ALBUMIN HUMAN 25 % IV SOLN
50.0000 g | Freq: Once | INTRAVENOUS | Status: AC
  Administered 2015-05-21: 12.5 g via INTRAVENOUS
  Filled 2015-05-21: qty 200

## 2015-05-21 MED ORDER — FUROSEMIDE 10 MG/ML IJ SOLN
40.0000 mg | Freq: Once | INTRAMUSCULAR | Status: AC
  Administered 2015-05-21: 40 mg via INTRAVENOUS
  Filled 2015-05-21: qty 4

## 2015-05-21 MED ORDER — SODIUM CHLORIDE 0.9 % IV SOLN
INTRAVENOUS | Status: DC
  Administered 2015-05-21: 200 mL via INTRAVENOUS

## 2015-05-21 NOTE — Telephone Encounter (Signed)
Per Dr.Rehman the patient will need to have labs drawn on 05/21/15.

## 2015-05-24 ENCOUNTER — Telehealth (INDEPENDENT_AMBULATORY_CARE_PROVIDER_SITE_OTHER): Payer: Self-pay | Admitting: *Deleted

## 2015-05-24 NOTE — Telephone Encounter (Signed)
Savannah Jackson said to have Dr. Laural Golden to call her. Asked if I could please tell him want the message was about. She is still short of breath and having trouble sleeping. Asked Meddie if she has spoke with Dr. Ileene Rubens and she said he told her to ask Dr. Laural Golden. The return phone number is [phone].

## 2015-05-25 NOTE — Telephone Encounter (Signed)
Dr.Rehman talked with patient on 05/24/15.

## 2015-06-15 ENCOUNTER — Encounter (INDEPENDENT_AMBULATORY_CARE_PROVIDER_SITE_OTHER): Payer: Self-pay | Admitting: Internal Medicine

## 2015-06-15 ENCOUNTER — Encounter (INDEPENDENT_AMBULATORY_CARE_PROVIDER_SITE_OTHER): Payer: Self-pay | Admitting: *Deleted

## 2015-06-15 ENCOUNTER — Other Ambulatory Visit (INDEPENDENT_AMBULATORY_CARE_PROVIDER_SITE_OTHER): Payer: Self-pay | Admitting: Internal Medicine

## 2015-06-15 ENCOUNTER — Ambulatory Visit (INDEPENDENT_AMBULATORY_CARE_PROVIDER_SITE_OTHER): Payer: Medicare HMO | Admitting: Internal Medicine

## 2015-06-15 VITALS — BP 100/70 | HR 64 | Temp 97.3°F | Resp 20 | Ht 63.0 in | Wt 157.8 lb

## 2015-06-15 DIAGNOSIS — J90 Pleural effusion, not elsewhere classified: Secondary | ICD-10-CM

## 2015-06-15 DIAGNOSIS — R1084 Generalized abdominal pain: Secondary | ICD-10-CM

## 2015-06-15 DIAGNOSIS — K7031 Alcoholic cirrhosis of liver with ascites: Secondary | ICD-10-CM

## 2015-06-15 DIAGNOSIS — J948 Other specified pleural conditions: Secondary | ICD-10-CM

## 2015-06-15 DIAGNOSIS — K7030 Alcoholic cirrhosis of liver without ascites: Secondary | ICD-10-CM | POA: Diagnosis not present

## 2015-06-15 MED ORDER — OXYCODONE HCL 5 MG PO TABS: 5.0000 mg | ORAL_TABLET | ORAL | Status: DC | PRN

## 2015-06-15 MED ORDER — OXYCODONE HCL 5 MG PO TABS: 5.0000 mg | ORAL_TABLET | Freq: Three times a day (TID) | ORAL | Status: DC | PRN

## 2015-06-15 NOTE — Patient Instructions (Signed)
Thoracentesis to be scheduled. Physician will call with results of blood tests.

## 2015-06-15 NOTE — Progress Notes (Signed)
Presenting complaint;  Shortness of breath and abdominal pain.  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 Patient has required at least 2 tabs since then. Last tablet was about 4 weeks ago. Patient now presents for follow-up visit accompanied by her daughter [name].  Subjective:  Patient does not feel well. She complains of shortness of breath and cough. She denies chest pain or fever. Cough occurs when she tries to lie flat. She has lost 16 pounds in the last 1 month. She has poor poor appetite nausea and today also complains of generalized abdominal pain which she's had for about 2 weeks. She states she had a tooth infection and was given anti-biotic as well as ibuprofen which she took for 1 week. However she did not experience vomiting hematemesis melena or rectal bleeding. She is having formed stools daily. Occasionally she has noted blood on the tissue. She denies vaginal bleeding or hematuria.   Current Medications: Outpatient Encounter Prescriptions as of 06/15/2015  Medication Sig  . folic acid (FOLVITE) 1 MG tablet Take 1 tablet (1 mg total) by mouth daily.  . furosemide (LASIX) 40 MG tablet Take 1 tablet by mouth daily.  Marland Kitchen lisinopril (PRINIVIL,ZESTRIL) 20 MG tablet Take 1 tablet (20 mg total) by mouth daily.  . metoprolol (LOPRESSOR) 50 MG tablet Take 50 mg by mouth 2 (two) times daily.  . pantoprazole (PROTONIX) 40 MG tablet Take 1 tablet (40 mg total) by mouth daily.  Marland Kitchen spironolactone (ALDACTONE) 25 MG tablet Take 2 tablets (50 mg total) by mouth 2 (two) times daily.  Marland Kitchen thiamine 100 MG tablet Take 1 tablet (100 mg total) by mouth daily.  Marland Kitchen lactulose (CHRONULAC) 10 GM/15ML solution Take 15 mLs (10 g  total) by mouth 2 (two) times daily. (Patient not taking: Reported on 06/15/2015)  . Potassium Chloride ER 20 MEQ TBCR Take 20 mEq by mouth daily. (Patient not taking: Reported on 06/15/2015)  . [DISCONTINUED] escitalopram (LEXAPRO) 10 MG tablet Take 1 tablet by mouth daily.  . [DISCONTINUED] Multiple Vitamin (MULTIVITAMIN WITH MINERALS) TABS tablet Take 1 tablet by mouth daily. (Patient not taking: Reported on 06/15/2015)  . [DISCONTINUED] predniSONE (STERAPRED UNI-PAK 21 TAB) 10 MG (21) TBPK tablet Take 1 tablet (10 mg total) by mouth daily. 30 mg po daily for 3 days, 20 mg po daily for 3 days, 10 mg po daily for 3 days then stop (Patient not taking: Reported on 06/15/2015)   No facility-administered encounter medications on file as of 06/15/2015.     Objective: Blood pressure 100/70, pulse 64, temperature 97.3 F (36.3 C), temperature source Oral, resp. rate 20, height 5\' 3"  (1.6 m), weight 157 lb 12.8 oz (71.578 kg). Patient is alert and does not have asterixis. She is unable to lie flat because she develops cough. Conjunctiva is pink. Sclera is nonicteric Oropharyngeal mucosa is normal. No neck masses or thyromegaly noted. Cardiac exam with regular rhythm normal S1 and S2. No murmur or gallop noted. Percussion note is dull were lower half of right chest. Breath sounds are normal elsewhere. Abdomen is full with normal bowel sounds. She has mild tenderness across upper have the abdomen. Liver spleen cannot be palpated. 1+ pitting edema involving both legs.  Labs/studies Results: Lab studies from 05/21/2015  Serum sodium 143,  potassium 4.1, chloride 1:15, CO2 23, BUN 30, creatinine 1.16 and glucose 123.    Assessment:  #1. 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 Fluid has reaccumulated and she needs another tap. Discussed with Dr. Luan Pulling if she would benefit from injection of second  Rosing agent into pleural cavity. He feels we should give another month or so. #2. Decompensated alcoholic cirrhosis. There has been slight improvement in hepatic function in the last 2 months as evidenced by improvement in INR but serum albumen remains low. #3. Generalized abdominal pain possibly related to recent use of ibuprofen for dental problems.   Plan:  Right sided ultrasound-guided thoracentesis for symptomatic relief. CBC, comprehensive chemistry panel serum amylase and lipase. Oxycodone 5 mg by mouth 3 times a day when necessary. Office visit in 4 weeks.

## 2015-06-15 NOTE — Progress Notes (Signed)
This encounter was created in error - please disregard.

## 2015-06-16 ENCOUNTER — Ambulatory Visit (HOSPITAL_COMMUNITY)
Admission: RE | Admit: 2015-06-16 | Discharge: 2015-06-16 | Disposition: A | Discharge status: Home / Self Care | Payer: Commercial Managed Care - HMO | Source: Ambulatory Visit | Attending: Internal Medicine | Admitting: Internal Medicine

## 2015-06-16 ENCOUNTER — Encounter (HOSPITAL_COMMUNITY): Payer: Self-pay

## 2015-06-16 DIAGNOSIS — J90 Pleural effusion, not elsewhere classified: Secondary | ICD-10-CM | POA: Diagnosis not present

## 2015-06-16 DIAGNOSIS — Z9889 Other specified postprocedural states: Secondary | ICD-10-CM

## 2015-06-16 LAB — LIPASE: LIPASE: 32 U/L (ref 7–60)

## 2015-06-16 LAB — COMPREHENSIVE METABOLIC PANEL
ALBUMIN: 2.1 g/dL — AB (ref 3.6–5.1)
ALK PHOS: 155 U/L — AB (ref 33–130)
ALT: 23 U/L (ref 6–29)
AST: 47 U/L — AB (ref 10–35)
BILIRUBIN TOTAL: 1.2 mg/dL (ref 0.2–1.2)
BUN: 42 mg/dL — ABNORMAL HIGH (ref 7–25)
CO2: 22 mmol/L (ref 20–31)
CREATININE: 2.1 mg/dL — AB (ref 0.50–1.05)
Calcium: 8.2 mg/dL — ABNORMAL LOW (ref 8.6–10.4)
Chloride: 109 mmol/L (ref 98–110)
Glucose, Bld: 92 mg/dL (ref 65–99)
Potassium: 5.1 mmol/L (ref 3.5–5.3)
SODIUM: 140 mmol/L (ref 135–146)
TOTAL PROTEIN: 5.6 g/dL — AB (ref 6.1–8.1)

## 2015-06-16 LAB — CBC
HEMATOCRIT: 32.7 % — AB (ref 36.0–46.0)
HEMOGLOBIN: 11 g/dL — AB (ref 12.0–15.0)
MCH: 27.4 pg (ref 26.0–34.0)
MCHC: 33.6 g/dL (ref 30.0–36.0)
MCV: 81.5 fL (ref 78.0–100.0)
MPV: 10.6 fL (ref 8.6–12.4)
Platelets: 260 10*3/uL (ref 150–400)
RBC: 4.01 MIL/uL (ref 3.87–5.11)
RDW: 15 % (ref 11.5–15.5)
WBC: 11.3 10*3/uL — AB (ref 4.0–10.5)

## 2015-06-16 LAB — AMYLASE: Amylase: 82 U/L (ref 0–105)

## 2015-06-16 NOTE — Progress Notes (Signed)
Thoracentesis complete no signs of distress. 1900 ml yellow colored pleural fluid removed.

## 2015-06-17 ENCOUNTER — Encounter (HOSPITAL_COMMUNITY)
Admission: RE | Admit: 2015-06-17 | Discharge: 2015-06-17 | Disposition: A | Discharge status: Home / Self Care | Payer: Commercial Managed Care - HMO | Source: Ambulatory Visit | Attending: Internal Medicine | Admitting: Internal Medicine

## 2015-06-17 ENCOUNTER — Encounter (INDEPENDENT_AMBULATORY_CARE_PROVIDER_SITE_OTHER): Payer: Self-pay | Admitting: *Deleted

## 2015-06-17 ENCOUNTER — Telehealth (INDEPENDENT_AMBULATORY_CARE_PROVIDER_SITE_OTHER): Payer: Self-pay | Admitting: *Deleted

## 2015-06-17 DIAGNOSIS — K7031 Alcoholic cirrhosis of liver with ascites: Secondary | ICD-10-CM | POA: Insufficient documentation

## 2015-06-17 DIAGNOSIS — J948 Other specified pleural conditions: Secondary | ICD-10-CM | POA: Insufficient documentation

## 2015-06-17 MED ORDER — ALBUMIN HUMAN 25 % IV SOLN
50.0000 g | Freq: Once | INTRAVENOUS | Status: AC
  Administered 2015-06-17: 50 g via INTRAVENOUS
  Filled 2015-06-17: qty 200

## 2015-06-17 NOTE — Telephone Encounter (Signed)
Per Dr.Rehman arrange Albumin 50 gram IV to be given today,06/17/15,06/18/15 and 06/21/15. Patient will need to have Met-7 in one week.

## 2015-06-18 ENCOUNTER — Encounter (HOSPITAL_COMMUNITY)
Admission: RE | Admit: 2015-06-18 | Discharge: 2015-06-18 | Disposition: A | Discharge status: Home / Self Care | Payer: Commercial Managed Care - HMO | Source: Ambulatory Visit | Attending: Internal Medicine | Admitting: Internal Medicine

## 2015-06-18 DIAGNOSIS — K7031 Alcoholic cirrhosis of liver with ascites: Secondary | ICD-10-CM | POA: Diagnosis not present

## 2015-06-18 DIAGNOSIS — J948 Other specified pleural conditions: Secondary | ICD-10-CM | POA: Diagnosis not present

## 2015-06-18 MED ORDER — ALBUMIN HUMAN 25 % IV SOLN
50.0000 g | Freq: Once | INTRAVENOUS | Status: AC
  Administered 2015-06-18: 50 g via INTRAVENOUS
  Filled 2015-06-18: qty 200

## 2015-06-21 ENCOUNTER — Encounter (HOSPITAL_COMMUNITY)
Admission: RE | Admit: 2015-06-21 | Discharge: 2015-06-21 | Disposition: A | Discharge status: Home / Self Care | Payer: Commercial Managed Care - HMO | Source: Ambulatory Visit | Attending: Internal Medicine | Admitting: Internal Medicine

## 2015-06-21 DIAGNOSIS — K7031 Alcoholic cirrhosis of liver with ascites: Secondary | ICD-10-CM | POA: Diagnosis not present

## 2015-06-21 DIAGNOSIS — J948 Other specified pleural conditions: Secondary | ICD-10-CM | POA: Diagnosis not present

## 2015-06-21 MED ORDER — ALBUMIN HUMAN 25 % IV SOLN
50.0000 g | Freq: Once | INTRAVENOUS | Status: AC
  Administered 2015-06-21: 50 g via INTRAVENOUS
  Filled 2015-06-21: qty 200

## 2015-06-24 DIAGNOSIS — K7030 Alcoholic cirrhosis of liver without ascites: Secondary | ICD-10-CM | POA: Diagnosis not present

## 2015-06-24 DIAGNOSIS — K7031 Alcoholic cirrhosis of liver with ascites: Secondary | ICD-10-CM | POA: Diagnosis not present

## 2015-06-24 LAB — BASIC METABOLIC PANEL
BUN: 29 mg/dL — AB (ref 7–25)
CHLORIDE: 109 mmol/L (ref 98–110)
CO2: 22 mmol/L (ref 20–31)
CREATININE: 1.31 mg/dL — AB (ref 0.50–1.05)
Calcium: 8.3 mg/dL — ABNORMAL LOW (ref 8.6–10.4)
Glucose, Bld: 84 mg/dL (ref 65–99)
Potassium: 4.9 mmol/L (ref 3.5–5.3)
Sodium: 141 mmol/L (ref 135–146)

## 2015-07-03 ENCOUNTER — Emergency Department (HOSPITAL_COMMUNITY): Payer: Commercial Managed Care - HMO

## 2015-07-03 ENCOUNTER — Inpatient Hospital Stay (HOSPITAL_COMMUNITY)
Admission: EM | Admit: 2015-07-03 | Discharge: 2015-07-18 | DRG: 981 | Disposition: A | Discharge status: Home / Self Care | Payer: Commercial Managed Care - HMO | Attending: Internal Medicine | Admitting: Internal Medicine

## 2015-07-03 ENCOUNTER — Encounter (HOSPITAL_COMMUNITY): Payer: Self-pay | Admitting: Emergency Medicine

## 2015-07-03 DIAGNOSIS — Z9841 Cataract extraction status, right eye: Secondary | ICD-10-CM

## 2015-07-03 DIAGNOSIS — Z96651 Presence of right artificial knee joint: Secondary | ICD-10-CM | POA: Diagnosis present

## 2015-07-03 DIAGNOSIS — Z91040 Latex allergy status: Secondary | ICD-10-CM

## 2015-07-03 DIAGNOSIS — K769 Liver disease, unspecified: Secondary | ICD-10-CM | POA: Diagnosis not present

## 2015-07-03 DIAGNOSIS — R791 Abnormal coagulation profile: Secondary | ICD-10-CM | POA: Insufficient documentation

## 2015-07-03 DIAGNOSIS — K7031 Alcoholic cirrhosis of liver with ascites: Secondary | ICD-10-CM | POA: Insufficient documentation

## 2015-07-03 DIAGNOSIS — R509 Fever, unspecified: Secondary | ICD-10-CM | POA: Diagnosis not present

## 2015-07-03 DIAGNOSIS — J90 Pleural effusion, not elsewhere classified: Secondary | ICD-10-CM | POA: Diagnosis present

## 2015-07-03 DIAGNOSIS — R634 Abnormal weight loss: Secondary | ICD-10-CM | POA: Diagnosis present

## 2015-07-03 DIAGNOSIS — N183 Chronic kidney disease, stage 3 (moderate): Secondary | ICD-10-CM | POA: Diagnosis not present

## 2015-07-03 DIAGNOSIS — J918 Pleural effusion in other conditions classified elsewhere: Secondary | ICD-10-CM | POA: Diagnosis present

## 2015-07-03 DIAGNOSIS — J80 Acute respiratory distress syndrome: Secondary | ICD-10-CM | POA: Diagnosis not present

## 2015-07-03 DIAGNOSIS — R609 Edema, unspecified: Secondary | ICD-10-CM | POA: Diagnosis not present

## 2015-07-03 DIAGNOSIS — K766 Portal hypertension: Secondary | ICD-10-CM | POA: Diagnosis present

## 2015-07-03 DIAGNOSIS — T502X5A Adverse effect of carbonic-anhydrase inhibitors, benzothiadiazides and other diuretics, initial encounter: Secondary | ICD-10-CM | POA: Diagnosis present

## 2015-07-03 DIAGNOSIS — E875 Hyperkalemia: Secondary | ICD-10-CM | POA: Diagnosis present

## 2015-07-03 DIAGNOSIS — I8500 Esophageal varices without bleeding: Secondary | ICD-10-CM | POA: Diagnosis present

## 2015-07-03 DIAGNOSIS — I9589 Other hypotension: Secondary | ICD-10-CM | POA: Diagnosis not present

## 2015-07-03 DIAGNOSIS — Z853 Personal history of malignant neoplasm of breast: Secondary | ICD-10-CM

## 2015-07-03 DIAGNOSIS — Z9842 Cataract extraction status, left eye: Secondary | ICD-10-CM

## 2015-07-03 DIAGNOSIS — R1084 Generalized abdominal pain: Secondary | ICD-10-CM

## 2015-07-03 DIAGNOSIS — N179 Acute kidney failure, unspecified: Secondary | ICD-10-CM | POA: Diagnosis present

## 2015-07-03 DIAGNOSIS — I129 Hypertensive chronic kidney disease with stage 1 through stage 4 chronic kidney disease, or unspecified chronic kidney disease: Secondary | ICD-10-CM | POA: Diagnosis present

## 2015-07-03 DIAGNOSIS — R0602 Shortness of breath: Secondary | ICD-10-CM | POA: Diagnosis present

## 2015-07-03 DIAGNOSIS — D689 Coagulation defect, unspecified: Secondary | ICD-10-CM | POA: Diagnosis present

## 2015-07-03 DIAGNOSIS — R05 Cough: Secondary | ICD-10-CM | POA: Diagnosis not present

## 2015-07-03 DIAGNOSIS — J9601 Acute respiratory failure with hypoxia: Secondary | ICD-10-CM | POA: Diagnosis not present

## 2015-07-03 DIAGNOSIS — R918 Other nonspecific abnormal finding of lung field: Secondary | ICD-10-CM | POA: Diagnosis not present

## 2015-07-03 DIAGNOSIS — R188 Other ascites: Secondary | ICD-10-CM

## 2015-07-03 DIAGNOSIS — R0600 Dyspnea, unspecified: Secondary | ICD-10-CM | POA: Diagnosis not present

## 2015-07-03 DIAGNOSIS — K7030 Alcoholic cirrhosis of liver without ascites: Secondary | ICD-10-CM | POA: Diagnosis not present

## 2015-07-03 DIAGNOSIS — M549 Dorsalgia, unspecified: Secondary | ICD-10-CM | POA: Diagnosis not present

## 2015-07-03 DIAGNOSIS — Z79899 Other long term (current) drug therapy: Secondary | ICD-10-CM

## 2015-07-03 DIAGNOSIS — Z885 Allergy status to narcotic agent status: Secondary | ICD-10-CM | POA: Diagnosis not present

## 2015-07-03 DIAGNOSIS — Z9889 Other specified postprocedural states: Secondary | ICD-10-CM

## 2015-07-03 DIAGNOSIS — R0603 Acute respiratory distress: Secondary | ICD-10-CM | POA: Diagnosis present

## 2015-07-03 DIAGNOSIS — Y909 Presence of alcohol in blood, level not specified: Secondary | ICD-10-CM | POA: Diagnosis not present

## 2015-07-03 DIAGNOSIS — R091 Pleurisy: Secondary | ICD-10-CM | POA: Diagnosis not present

## 2015-07-03 DIAGNOSIS — N184 Chronic kidney disease, stage 4 (severe): Secondary | ICD-10-CM | POA: Insufficient documentation

## 2015-07-03 DIAGNOSIS — J948 Other specified pleural conditions: Principal | ICD-10-CM | POA: Diagnosis present

## 2015-07-03 LAB — COMPREHENSIVE METABOLIC PANEL
ALBUMIN: 2.3 g/dL — AB (ref 3.5–5.0)
ALT: 24 U/L (ref 14–54)
AST: 65 U/L — AB (ref 15–41)
Alkaline Phosphatase: 165 U/L — ABNORMAL HIGH (ref 38–126)
Anion gap: 7 (ref 5–15)
BILIRUBIN TOTAL: 1.4 mg/dL — AB (ref 0.3–1.2)
BUN: 33 mg/dL — AB (ref 6–20)
CO2: 21 mmol/L — ABNORMAL LOW (ref 22–32)
Calcium: 7.8 mg/dL — ABNORMAL LOW (ref 8.9–10.3)
Chloride: 110 mmol/L (ref 101–111)
Creatinine, Ser: 1.44 mg/dL — ABNORMAL HIGH (ref 0.44–1.00)
GFR calc Af Amer: 45 mL/min — ABNORMAL LOW (ref >60)
GFR calc non Af Amer: 39 mL/min — ABNORMAL LOW (ref >60)
GLUCOSE: 104 mg/dL — AB (ref 65–99)
Potassium: 3.6 mmol/L (ref 3.5–5.1)
Sodium: 138 mmol/L (ref 135–145)
TOTAL PROTEIN: 5.7 g/dL — AB (ref 6.5–8.1)

## 2015-07-03 LAB — CBC WITH DIFFERENTIAL/PLATELET
BASOS ABS: 0.2 10*3/uL — AB (ref 0.0–0.1)
Basophils Relative: 2 % — ABNORMAL HIGH (ref 0–1)
Eosinophils Absolute: 0.3 10*3/uL (ref 0.0–0.7)
Eosinophils Relative: 3 % (ref 0–5)
HEMATOCRIT: 31.9 % — AB (ref 36.0–46.0)
HEMOGLOBIN: 10.8 g/dL — AB (ref 12.0–15.0)
LYMPHS ABS: 2.3 10*3/uL (ref 0.7–4.0)
LYMPHS PCT: 20 % (ref 12–46)
MCH: 27.2 pg (ref 26.0–34.0)
MCHC: 33.9 g/dL (ref 30.0–36.0)
MCV: 80.4 fL (ref 78.0–100.0)
MONOS PCT: 18 % — AB (ref 3–12)
Monocytes Absolute: 2 10*3/uL — ABNORMAL HIGH (ref 0.1–1.0)
NEUTROS PCT: 57 % (ref 43–77)
Neutro Abs: 6.5 10*3/uL (ref 1.7–7.7)
Platelets: 200 10*3/uL (ref 150–400)
RBC: 3.97 MIL/uL (ref 3.87–5.11)
RDW: 14.8 % (ref 11.5–15.5)
WBC: 11.3 10*3/uL — AB (ref 4.0–10.5)

## 2015-07-03 LAB — PROTIME-INR
INR: 1.59 — ABNORMAL HIGH (ref 0.00–1.49)
Prothrombin Time: 19 seconds — ABNORMAL HIGH (ref 11.6–15.2)

## 2015-07-03 LAB — BRAIN NATRIURETIC PEPTIDE: B Natriuretic Peptide: 241 pg/mL — ABNORMAL HIGH (ref 0.0–100.0)

## 2015-07-03 MED ORDER — ALBUTEROL SULFATE (2.5 MG/3ML) 0.083% IN NEBU
5.0000 mg | INHALATION_SOLUTION | Freq: Once | RESPIRATORY_TRACT | Status: AC
  Administered 2015-07-03: 5 mg via RESPIRATORY_TRACT
  Filled 2015-07-03: qty 6

## 2015-07-03 MED ORDER — ALUM & MAG HYDROXIDE-SIMETH 200-200-20 MG/5ML PO SUSP: 30.0000 mL | Freq: Four times a day (QID) | ORAL | Status: DC | PRN

## 2015-07-03 MED ORDER — OXYCODONE HCL 5 MG PO TABS
5.0000 mg | ORAL_TABLET | Freq: Three times a day (TID) | ORAL | Status: DC | PRN
  Administered 2015-07-06 – 2015-07-16 (×8): 5 mg via ORAL
  Filled 2015-07-03 (×8): qty 1

## 2015-07-03 MED ORDER — VITAMIN B-1 100 MG PO TABS
100.0000 mg | ORAL_TABLET | Freq: Every day | ORAL | Status: DC
Start: 2015-07-03 — End: 2015-07-18
  Administered 2015-07-03 – 2015-07-18 (×15): 100 mg via ORAL
  Filled 2015-07-03 (×15): qty 1

## 2015-07-03 MED ORDER — METOPROLOL TARTRATE 50 MG PO TABS
50.0000 mg | ORAL_TABLET | Freq: Two times a day (BID) | ORAL | Status: DC
  Administered 2015-07-03 – 2015-07-04 (×2): 50 mg via ORAL
  Filled 2015-07-03 (×2): qty 1

## 2015-07-03 MED ORDER — ZOLPIDEM TARTRATE 5 MG PO TABS
5.0000 mg | ORAL_TABLET | Freq: Once | ORAL | Status: AC
  Administered 2015-07-03: 5 mg via ORAL
  Filled 2015-07-03: qty 1

## 2015-07-03 MED ORDER — LISINOPRIL 20 MG PO TABS
20.0000 mg | ORAL_TABLET | Freq: Every day | ORAL | Status: DC
  Administered 2015-07-03 – 2015-07-04 (×2): 20 mg via ORAL
  Filled 2015-07-03 (×2): qty 1

## 2015-07-03 MED ORDER — PANTOPRAZOLE SODIUM 40 MG PO TBEC
40.0000 mg | DELAYED_RELEASE_TABLET | Freq: Every day | ORAL | Status: DC
  Administered 2015-07-03 – 2015-07-18 (×15): 40 mg via ORAL
  Filled 2015-07-03 (×15): qty 1

## 2015-07-03 MED ORDER — ACETAMINOPHEN 325 MG PO TABS
650.0000 mg | ORAL_TABLET | Freq: Four times a day (QID) | ORAL | Status: DC | PRN
  Administered 2015-07-04: 650 mg via ORAL
  Filled 2015-07-03: qty 2

## 2015-07-03 MED ORDER — ALBUTEROL SULFATE (2.5 MG/3ML) 0.083% IN NEBU
INHALATION_SOLUTION | RESPIRATORY_TRACT | Status: AC
  Filled 2015-07-03: qty 3

## 2015-07-03 MED ORDER — ACETAMINOPHEN 650 MG RE SUPP: 650.0000 mg | Freq: Four times a day (QID) | RECTAL | Status: DC | PRN

## 2015-07-03 MED ORDER — FOLIC ACID 1 MG PO TABS
1.0000 mg | ORAL_TABLET | Freq: Every day | ORAL | Status: DC
  Administered 2015-07-03 – 2015-07-18 (×15): 1 mg via ORAL
  Filled 2015-07-03 (×15): qty 1

## 2015-07-03 MED ORDER — ONDANSETRON HCL 4 MG PO TABS
4.0000 mg | ORAL_TABLET | Freq: Three times a day (TID) | ORAL | Status: DC | PRN
  Administered 2015-07-07 – 2015-07-15 (×4): 4 mg via ORAL
  Filled 2015-07-03 (×4): qty 1

## 2015-07-03 NOTE — Progress Notes (Signed)
Received report from Lana Fish, RN. Pt to transfer from [facility] to [facility].

## 2015-07-03 NOTE — H&P (Signed)
History and Physical  [name] [id number] DOB: Apr 14, 1956 DOA: 07/03/2015  Referring physician: Dr Alvino Chapel, ED physician PCP: Rosita Fire, MD   Chief Complaint: Shortness of breath  HPI: Savannah Jackson is a 59 y.o. female  With a history of alcoholic cirrhosis, hepatomegaly, diverticulosis, right pleural effusion status post for centesis every month 3 over the past 3 months. The patient presents today with increasing shortness of breath which began 2-3 days after her last thoracentesis. Her symptoms worsening over the past couple of weeks and are worse when ambulating and improved at rest. In the emergency department, nasal cannula was applied which provided oxygen saturation mid 90s, however this worsens when ambulating and her oxygen level drops to high 80s.   Review of Systems:   Pt complains of chronic nonproductive cough.  Pt denies any fevers, chills, nausea, vomiting, diarrhea, constipation, abdominal pain, shortness of breath, dyspnea on exertion, orthopnea, wheezing, palpitations, headache, vision changes, lightheadedness, dizziness, diarrhea, constipation, melena, rectal bleeding.  Review of systems are otherwise negative  Past Medical History  Diagnosis Date  . Hypertension   . Brain aneurysm   . Rotator cuff syndrome of left shoulder   . DDD (degenerative disc disease), lumbar   . Chronic back pain Diverticultis  . Cirrhosis     alcoholic  . Sleep apnea   . ETOH abuse   . Bilateral leg edema   . Hepatomegaly     scope 2014  . Diverticulosis     scope 2014  . Cancer     breast cancer   Past Surgical History  Procedure Laterality Date  . Cholecystectomy    . Brain surgery    . Breast surgery    . Total knee arthroplasty      right. 2002  . Cataract extraction Bilateral     [facility] 2 or 3 years ago  . Colonoscopy N/A 04/10/2013    Procedure: COLONOSCOPY;  Surgeon: Rogene Houston, MD;  [facility];  Service: Endoscopy;  Laterality:  N/A;  1030-rescheduled to 1200 Ann notified pt  . Esophagogastroduodenoscopy N/A 04/22/2015    Procedure: ESOPHAGOGASTRODUODENOSCOPY (EGD);  Surgeon: Rogene Houston, MD;  Location: [facility];  Service: Endoscopy;  Laterality: N/A;   Social History:  reports that she has never smoked. She has never used smokeless tobacco. She reports that she drinks alcohol. She reports that she does not use illicit drugs. she denies alcohol use since June 2016 Patient lives at home & is able to participate in activities of daily living  Allergies  Allergen Reactions  . Latex Swelling    Discoloration of skin.   . Vicodin [Hydrocodone-Acetaminophen] Itching   Family history of cirrhosis, brain aneurysm.   Prior to Admission medications   Medication Sig Start Date End Date Taking? Authorizing Provider  folic acid (FOLVITE) 1 MG tablet Take 1 tablet (1 mg total) by mouth daily. 04/29/15  Yes Rosita Fire, MD  lisinopril (PRINIVIL,ZESTRIL) 20 MG tablet Take 1 tablet (20 mg total) by mouth daily. 07/18/12  Yes Rosita Fire, MD  metoprolol (LOPRESSOR) 50 MG tablet Take 50 mg by mouth 2 (two) times daily.   Yes Historical Provider, MD  omeprazole (PRILOSEC) 40 MG capsule Take 40 mg by mouth daily.   Yes Historical Provider, MD  ondansetron (ZOFRAN) 4 MG tablet Take 4 mg by mouth every 8 (eight) hours as needed for nausea or vomiting.   Yes Historical Provider, MD  oxyCODONE (OXY IR/ROXICODONE) 5 MG immediate release tablet  Take 1 tablet (5 mg total) by mouth 3 (three) times daily as needed for severe pain. 06/15/15  Yes Rogene Houston, MD  pantoprazole (PROTONIX) 40 MG tablet Take 1 tablet (40 mg total) by mouth daily. 04/29/15  Yes Rosita Fire, MD  Potassium Chloride ER 20 MEQ TBCR Take 20 mEq by mouth daily. 05/13/15  Yes Rogene Houston, MD  thiamine 100 MG tablet Take 1 tablet (100 mg total) by mouth daily. 04/29/15  Yes Rosita Fire, MD  lactulose (CHRONULAC) 10 GM/15ML solution Take 15 mLs (10 g total)  by mouth 2 (two) times daily. Patient not taking: Reported on 07/03/2015 04/29/15   Rosita Fire, MD  spironolactone (ALDACTONE) 25 MG tablet Take 2 tablets (50 mg total) by mouth 2 (two) times daily. Patient not taking: Reported on 07/03/2015 05/13/15   Rogene Houston, MD    Physical Exam: BP 119/85 mmHg  Pulse 78  Temp(Src) 98.2 F (36.8 C) (Oral)  Resp 29  Ht 5\' 3"  (1.6 m)  Wt 71.215 kg (157 lb)  BMI 27.82 kg/m2  SpO2 98%  General: Older black female. Awake and alert and oriented x3. No acute cardiopulmonary distress.  Eyes: Pupils equal, round, reactive to light. Extraocular muscles are intact. Sclerae anicteric and noninjected.  ENT: Moist mucosal membranes. No mucosal lesions.   Neck: Neck supple without lymphadenopathy. No carotid bruits. No masses palpated.  Cardiovascular: Regular rate with normal S1-S2 sounds. No murmurs, rubs, gallops auscultated. No JVD.  Respiratory: Absent breath sounds from base until the upper lung fields on the right. Full lung sounds on the left. Lung sounds are clear  Abdomen: Soft, nontender, nondistended. Active bowel sounds. No masses or hepatosplenomegaly  Skin: Dry, warm to touch. 2+ dorsalis pedis and radial pulses. Musculoskeletal: No calf or leg pain. All major joints not erythematous nontender.  Psychiatric: Intact judgment and insight.  Neurologic: No focal neurological deficits. Cranial nerves II through XII are grossly intact.           Labs on Admission:  Basic Metabolic Panel:  Recent Labs Lab 07/03/15 1330  NA 138  K 3.6  CL 110  CO2 21*  GLUCOSE 104*  BUN 33*  CREATININE 1.44*  CALCIUM 7.8*   Liver Function Tests:  Recent Labs Lab 07/03/15 1330  AST 65*  ALT 24  ALKPHOS 165*  BILITOT 1.4*  PROT 5.7*  ALBUMIN 2.3*   No results for input(s): LIPASE, AMYLASE in the last 168 hours. No results for input(s): AMMONIA in the last 168 hours. CBC:  Recent Labs Lab 07/03/15 1330  WBC 11.3*  NEUTROABS 6.5  HGB  10.8*  HCT 31.9*  MCV 80.4  PLT 200   Cardiac Enzymes: No results for input(s): CKTOTAL, CKMB, CKMBINDEX, TROPONINI in the last 168 hours.  BNP (last 3 results)  Recent Labs  07/03/15 1330  BNP 241.0*    ProBNP (last 3 results) No results for input(s): PROBNP in the last 8760 hours.  CBG: No results for input(s): GLUCAP in the last 168 hours.  Radiological Exams on Admission: Dg Chest 2 View  07/03/2015   CLINICAL DATA:  Shortness of breath and cough for 1 week. Fever earlier in the week. Recent admission. Patient had fluid drawn from the right lung many times. Weakness.  EXAM: CHEST  2 VIEW  COMPARISON:  06/16/2015  FINDINGS: Moderate right-sided pleural effusion, significantly increased since prior study. Mediastinal shift towards the left. Heart size is probably normal. Minimal left basilar subsegmental atelectasis. Surgical clips  are present in the upper abdomen.  IMPRESSION: Reaccumulation of right pleural fluid.  Minimal left lower lobe atelectasis.   Electronically Signed   By: Nolon Nations M.D.   On: 07/03/2015 14:32    EKG: Independently reviewed. Sinus rhythm with ventricular rate of 80. LVH. Normal intervals. No acute ST changes.  Assessment/Plan Present on Admission:  . Pleural effusion associated with hepatic disorder . Acute respiratory distress  This patient was discussed with the ED physician, including pertinent vitals, physical exam findings, labs, and imaging.  We also discussed care given by the ED provider.  #1 acute respiratory distress  Admit to [facility]  As the patient is currently satting in the mid 90s on nasal cannula, I believe that she can go to [place] floor.  Continue nasal cannula #2 pleural effusion associated with hepatic disorder  I discussed this patient with Dr. Pascal Lux of interventional radiology who will consult on the patient for thoracentesis  Hold patient nothing by mouth after midnight except for meds  I do not  feel patient requires any labs done on the pleural fluid given that she has had studies done with prior thoracentesis which have been negative.  We'll check CBC in the morning  As this will be her for thoracentesis, a more permanent solution may be required, such as a TIPS. #3 hypertension  Continue anti-hypertensives  Hold parameters placed on beta blocker told if systolic blood pressure less than 90 #4 cirrhosis    DVT prophylaxis: SCDs as the patient will be going for procedure tomorrow  Consultants: Interventional radiology  Code Status: Full code  Family Communication: Family in the room   Disposition Plan: Admit to [place] at [place], [facility] Pager [phone]

## 2015-07-03 NOTE — ED Notes (Signed)
12 lead EKG given to Dr. Alvino Chapel

## 2015-07-03 NOTE — ED Provider Notes (Signed)
CSN: [id number]     Arrival date & time 07/03/15  1303 History   First MD Initiated Contact with Patient 07/03/15 1322     Chief Complaint  Patient presents with  . Shortness of Breath     (Consider location/radiation/quality/duration/timing/severity/associated sxs/prior Treatment) Patient is a 59 y.o. female presenting with shortness of breath. The history is provided by the patient.  Shortness of Breath Severity:  Moderate Associated symptoms: cough   Associated symptoms: no abdominal pain, no chest pain, no headaches, no rash and no vomiting    patient has had shortness breath over the last few days. Has a history of alcoholic cirrhosis. Has had multiple thoracentesis. Has been worse over last few days. States she cannot lay back more. Has had a cough with minimal production. No fevers. No swelling or legs. No real chest pain. States it feels like when she has had extra fluid in the past.  Past Medical History  Diagnosis Date  . Hypertension   . Brain aneurysm   . Rotator cuff syndrome of left shoulder   . DDD (degenerative disc disease), lumbar   . Chronic back pain Diverticultis  . Cirrhosis     alcoholic  . Sleep apnea   . ETOH abuse   . Bilateral leg edema   . Hepatomegaly     scope 2014  . Diverticulosis     scope 2014  . Cancer     breast cancer   Past Surgical History  Procedure Laterality Date  . Cholecystectomy    . Brain surgery    . Breast surgery    . Total knee arthroplasty      right. 2002  . Cataract extraction Bilateral     [facility] 2 [facility] years ago  . Colonoscopy N/A 04/10/2013    Procedure: COLONOSCOPY;  Surgeon: Rogene Houston, MD;  [facility];  Service: Endoscopy;  Laterality: N/A;  1030-rescheduled to 1200 Ann notified pt  . Esophagogastroduodenoscopy N/A 04/22/2015    Procedure: ESOPHAGOGASTRODUODENOSCOPY (EGD);  Surgeon: Rogene Houston, MD;  Location: [facility];  Service: Endoscopy;  Laterality: N/A;   History reviewed. No  pertinent family history. Social History  Substance Use Topics  . Smoking status: Never Smoker   . Smokeless tobacco: Never Used  . Alcohol Use: 0.0 oz/week    0 Standard drinks or equivalent per week     Comment: none since 03/2015   OB History    Gravida Para Term Preterm AB TAB SAB Ectopic Multiple Living            1     Review of Systems  Constitutional: Positive for appetite change and fatigue. Negative for activity change.  Eyes: Negative for pain.  Respiratory: Positive for cough and shortness of breath. Negative for chest tightness.   Cardiovascular: Positive for leg swelling. Negative for chest pain.  Gastrointestinal: Negative for nausea, vomiting, abdominal pain and diarrhea.  Genitourinary: Negative for flank pain.  Musculoskeletal: Negative for back pain and neck stiffness.  Skin: Negative for rash.  Neurological: Negative for weakness, numbness and headaches.  Psychiatric/Behavioral: Negative for behavioral problems.      Allergies  Latex and Vicodin  Home Medications   Prior to Admission medications   Medication Sig Start Date End Date Taking? Authorizing Provider  folic acid (FOLVITE) 1 MG tablet Take 1 tablet (1 mg total) by mouth daily. 04/29/15  Yes Rosita Fire, MD  lisinopril (PRINIVIL,ZESTRIL) 20 MG tablet Take 1 tablet (20 mg total)  by mouth daily. 07/18/12  Yes Rosita Fire, MD  metoprolol (LOPRESSOR) 50 MG tablet Take 50 mg by mouth 2 (two) times daily.   Yes Historical Provider, MD  omeprazole (PRILOSEC) 40 MG capsule Take 40 mg by mouth daily.   Yes Historical Provider, MD  ondansetron (ZOFRAN) 4 MG tablet Take 4 mg by mouth every 8 (eight) hours as needed for nausea or vomiting.   Yes Historical Provider, MD  oxyCODONE (OXY IR/ROXICODONE) 5 MG immediate release tablet Take 1 tablet (5 mg total) by mouth 3 (three) times daily as needed for severe pain. 06/15/15  Yes Rogene Houston, MD  pantoprazole (PROTONIX) 40 MG tablet Take 1 tablet (40 mg  total) by mouth daily. 04/29/15  Yes Rosita Fire, MD  Potassium Chloride ER 20 MEQ TBCR Take 20 mEq by mouth daily. 05/13/15  Yes Rogene Houston, MD  thiamine 100 MG tablet Take 1 tablet (100 mg total) by mouth daily. 04/29/15  Yes Rosita Fire, MD  lactulose (CHRONULAC) 10 GM/15ML solution Take 15 mLs (10 g total) by mouth 2 (two) times daily. Patient not taking: Reported on 07/03/2015 04/29/15   Rosita Fire, MD  spironolactone (ALDACTONE) 25 MG tablet Take 2 tablets (50 mg total) by mouth 2 (two) times daily. Patient not taking: Reported on 07/03/2015 05/13/15   Rogene Houston, MD   BP 119/85 mmHg  Pulse 78  Temp(Src) 98.2 F (36.8 C) (Oral)  Resp 29  Ht 5\' 3"  (1.6 m)  Wt 157 lb (71.215 kg)  BMI 27.82 kg/m2  SpO2 98% Physical Exam  Constitutional: She is oriented to person, place, and time. She appears well-developed and well-nourished.  HENT:  Head: Normocephalic and atraumatic.  Eyes: Pupils are equal, round, and reactive to light.  Neck: Normal range of motion.  Cardiovascular: Normal rate, regular rhythm and normal heart sounds.   No murmur heard. Pulmonary/Chest: No respiratory distress. She has no wheezes.  Mildly dyspneic. Somewhat decreased breath sounds on right.  Abdominal: Soft. Bowel sounds are normal. She exhibits distension. There is no tenderness. There is no rebound and no guarding.  Musculoskeletal: Normal range of motion. She exhibits edema.  Neurological: She is alert and oriented to person, place, and time. No cranial nerve deficit.  Skin: Skin is warm and dry.  Psychiatric: She has a normal mood and affect. Her speech is normal.  Nursing note and vitals reviewed.   ED Course  Procedures (including critical care time) Labs Review Labs Reviewed  CBC WITH DIFFERENTIAL/PLATELET - Abnormal; Notable for the following:    WBC 11.3 (*)    Hemoglobin 10.8 (*)    HCT 31.9 (*)    Monocytes Relative 18 (*)    Basophils Relative 2 (*)    Monocytes Absolute 2.0 (*)     Basophils Absolute 0.2 (*)    All other components within normal limits  COMPREHENSIVE METABOLIC PANEL - Abnormal; Notable for the following:    CO2 21 (*)    Glucose, Bld 104 (*)    BUN 33 (*)    Creatinine, Ser 1.44 (*)    Calcium 7.8 (*)    Total Protein 5.7 (*)    Albumin 2.3 (*)    AST 65 (*)    Alkaline Phosphatase 165 (*)    Total Bilirubin 1.4 (*)    GFR calc non Af Amer 39 (*)    GFR calc Af Amer 45 (*)    All other components within normal limits  BRAIN NATRIURETIC PEPTIDE -  Abnormal; Notable for the following:    B Natriuretic Peptide 241.0 (*)    All other components within normal limits  PROTIME-INR - Abnormal; Notable for the following:    Prothrombin Time 19.0 (*)    INR 1.59 (*)    All other components within normal limits  URINALYSIS, ROUTINE W REFLEX MICROSCOPIC (NOT AT [facility])    Imaging Review Dg Chest 2 View  07/03/2015   CLINICAL DATA:  Shortness of breath and cough for 1 week. Fever earlier in the week. Recent admission. Patient had fluid drawn from the right lung many times. Weakness.  EXAM: CHEST  2 VIEW  COMPARISON:  06/16/2015  FINDINGS: Moderate right-sided pleural effusion, significantly increased since prior study. Mediastinal shift towards the left. Heart size is probably normal. Minimal left basilar subsegmental atelectasis. Surgical clips are present in the upper abdomen.  IMPRESSION: Reaccumulation of right pleural fluid.  Minimal left lower lobe atelectasis.   Electronically Signed   By: Nolon Nations M.D.   On: 07/03/2015 14:32   I have personally reviewed and evaluated these images and lab results as part of my medical decision-making.   EKG Interpretation   Date/Time:  Saturday July 03 2015 13:36:18 EDT Ventricular Rate:  80 PR Interval:  146 QRS Duration: 105 QT Interval:  390 QTC Calculation: 450 R Axis:   17 Text Interpretation:  Sinus rhythm Probable LVH with secondary repol abnrm  Confirmed by Alvino Chapel  MD, Ovid Curd [phone])  on 07/03/2015 3:43:06 PM      MDM   Final diagnoses:  Pleural effusion associated with hepatic disorder  Alcoholic cirrhosis of liver with ascites    Patient with accumulation of pleural effusion. History of same but has required drainage. There is no one at [facility] to do a drainage this weekend since it is a holiday weekend she would not elaborate drain for 3 days so patient was transferred to [place], MD 07/03/15 [phone]

## 2015-07-03 NOTE — ED Notes (Signed)
PT c/o increased SOB on exertion with non-productive wet cough worsening x3 days.

## 2015-07-03 NOTE — Progress Notes (Signed)
Pt arrived to unit via stretcher. Pt was oriented to room and call bell stystem. Pt was also educated on how to call for assistance via the call bell as well as via the telephone. The armband was placed on pt. Pt in no apparent distress at this time. Will continue to monitor.

## 2015-07-04 ENCOUNTER — Inpatient Hospital Stay (HOSPITAL_COMMUNITY): Payer: Commercial Managed Care - HMO

## 2015-07-04 LAB — GRAM STAIN

## 2015-07-04 LAB — BASIC METABOLIC PANEL
Anion gap: 5 (ref 5–15)
BUN: 28 mg/dL — AB (ref 6–20)
CALCIUM: 8.3 mg/dL — AB (ref 8.9–10.3)
CO2: 22 mmol/L (ref 22–32)
CREATININE: 1.2 mg/dL — AB (ref 0.44–1.00)
Chloride: 114 mmol/L — ABNORMAL HIGH (ref 101–111)
GFR calc Af Amer: 56 mL/min — ABNORMAL LOW (ref >60)
GFR, EST NON AFRICAN AMERICAN: 48 mL/min — AB (ref >60)
GLUCOSE: 95 mg/dL (ref 65–99)
Potassium: 4.4 mmol/L (ref 3.5–5.1)
Sodium: 141 mmol/L (ref 135–145)

## 2015-07-04 LAB — CBC
HCT: 31 % — ABNORMAL LOW (ref 36.0–46.0)
HEMOGLOBIN: 9.8 g/dL — AB (ref 12.0–15.0)
MCH: 25.8 pg — AB (ref 26.0–34.0)
MCHC: 31.6 g/dL (ref 30.0–36.0)
MCV: 81.6 fL (ref 78.0–100.0)
PLATELETS: 178 10*3/uL (ref 150–400)
RBC: 3.8 MIL/uL — ABNORMAL LOW (ref 3.87–5.11)
RDW: 15.4 % (ref 11.5–15.5)
WBC: 10.7 10*3/uL — ABNORMAL HIGH (ref 4.0–10.5)

## 2015-07-04 LAB — BODY FLUID CELL COUNT WITH DIFFERENTIAL
Lymphs, Fluid: 10 %
MONOCYTE-MACROPHAGE-SEROUS FLUID: 19 % — AB (ref 50–90)
NEUTROPHIL FLUID: 71 % — AB (ref 0–25)
WBC FLUID: 342 uL (ref 0–1000)

## 2015-07-04 LAB — PROTEIN, BODY FLUID: Total protein, fluid: <3 g/dL

## 2015-07-04 LAB — LACTATE DEHYDROGENASE, PLEURAL OR PERITONEAL FLUID: LD FL: 30 U/L — AB (ref 3–23)

## 2015-07-04 MED ORDER — FUROSEMIDE 40 MG PO TABS
40.0000 mg | ORAL_TABLET | Freq: Once | ORAL | Status: AC
Start: 2015-07-04 — End: 2015-07-04
  Administered 2015-07-04: 40 mg via ORAL
  Filled 2015-07-04: qty 1

## 2015-07-04 MED ORDER — LIDOCAINE HCL (PF) 1 % IJ SOLN
INTRAMUSCULAR | Status: AC
  Filled 2015-07-04: qty 10

## 2015-07-04 MED ORDER — SPIRONOLACTONE 25 MG PO TABS
100.0000 mg | ORAL_TABLET | Freq: Every day | ORAL | Status: DC
  Administered 2015-07-04: 100 mg via ORAL
  Filled 2015-07-04 (×2): qty 4

## 2015-07-04 MED ORDER — METOPROLOL TARTRATE 12.5 MG HALF TABLET
12.5000 mg | ORAL_TABLET | Freq: Two times a day (BID) | ORAL | Status: DC
  Filled 2015-07-04 (×2): qty 1

## 2015-07-04 MED ORDER — BENZONATATE 100 MG PO CAPS
100.0000 mg | ORAL_CAPSULE | Freq: Three times a day (TID) | ORAL | Status: DC | PRN
  Administered 2015-07-06: 100 mg via ORAL
  Filled 2015-07-04: qty 1

## 2015-07-04 NOTE — Procedures (Signed)
US guided diagnostic/therapeutic right thoracentesis performed yielding 2.2 liters hazy, yellow fluid. A portion of the fluid was sent to the lab for preordered studies. F/u CXR pending. No immediate complications. Only the above amount of fluid was removed today secondary to increased pt coughing.

## 2015-07-04 NOTE — Progress Notes (Signed)
PROGRESS NOTE  Savannah Jackson [id number] DOB: 24-Nov-1955 DOA: 07/03/2015 PCP: Rosita Fire, MD  HPI/Recap of past 24 hours: sob  Assessment/Plan: Active Problems:   Pleural effusion associated with hepatic disorder   Acute respiratory distress  Recurrent hepatic hydrothorax: thoracentesis for symptom relieve, appreciate IR input. Lasix/spironolectone/oxygen supplement. GI consulted for further recommendation? Tips? Liver transplant?  Alcohol cirrhosis: with h/i gi bleed (unknown details) and history of possible hepatic encephalopathy, not on meds. Currently no confusion. Reported stop drinking 4 months.  H/o HTN: d/c lisinopril. Lower lopressor dose, give room to titrate diuretics.    Code Status: full  Family Communication: patient   Disposition Plan: remain in the hospital   Consultants:  GI  IR  Procedures:  US guided thoracentesis  Antibiotics:  none   Objective: BP 97/47 mmHg  Pulse 70  Temp(Src) 98.2 F (36.8 C) (Oral)  Resp 24  Ht 5\' 3"  (1.6 m)  Wt 157 lb (71.215 kg)  BMI 27.82 kg/m2  SpO2 98%  Intake/Output Summary (Last 24 hours) at 07/04/15 1407 Last data filed at 07/04/15 1219  Gross per 24 hour  Intake      0 ml  Output    200 ml  Net   -200 ml   Filed Weights   07/03/15 1313 07/03/15 1837  Weight: 157 lb (71.215 kg) 157 lb (71.215 kg)    Exam:   General:  Sob, tachypnea  Cardiovascular: RRR  Respiratory: diminished on the right  Abdomen: Soft/ND/NT, positive BS  Musculoskeletal: No Edema  Neuro: aaox3  Data Reviewed: Basic Metabolic Panel:  Recent Labs Lab 07/03/15 1330 07/04/15 0853  NA 138 141  K 3.6 4.4  CL 110 114*  CO2 21* 22  GLUCOSE 104* 95  BUN 33* 28*  CREATININE 1.44* 1.20*  CALCIUM 7.8* 8.3*   Liver Function Tests:  Recent Labs Lab 07/03/15 1330  AST 65*  ALT 24  ALKPHOS 165*  BILITOT 1.4*  PROT 5.7*  ALBUMIN 2.3*   No results for input(s): LIPASE, AMYLASE in the last 168  hours. No results for input(s): AMMONIA in the last 168 hours. CBC:  Recent Labs Lab 07/03/15 1330 07/04/15 0853  WBC 11.3* 10.7*  NEUTROABS 6.5  --   HGB 10.8* 9.8*  HCT 31.9* 31.0*  MCV 80.4 81.6  PLT 200 178   Cardiac Enzymes:   No results for input(s): CKTOTAL, CKMB, CKMBINDEX, TROPONINI in the last 168 hours. BNP (last 3 results)  Recent Labs  07/03/15 1330  BNP 241.0*    ProBNP (last 3 results) No results for input(s): PROBNP in the last 8760 hours.  CBG: No results for input(s): GLUCAP in the last 168 hours.  No results found for this or any previous visit (from the past 240 hour(s)).   Studies: Dg Chest 1 View  07/04/2015   CLINICAL DATA:  Status post right thoracentesis.  EXAM: CHEST  1 VIEW  COMPARISON:  07/03/2015  FINDINGS: There is a moderate right pleural effusion which has decreased in size compared with 07/03/2015. There is no right pneumothorax. There is no left pleural effusion. There is no left pneumothorax. There is no focal consolidation. The heart and mediastinal contours are stable.  The osseous structures are unremarkable.  IMPRESSION: Moderate right pleural effusion without a pneumothorax.   Electronically Signed   By: Kathreen Devoid   On: 07/04/2015 13:32   Dg Chest 2 View  07/03/2015   CLINICAL DATA:  Shortness of breath and cough for 1 week. Fever  earlier in the week. Recent admission. Patient had fluid drawn from the right lung many times. Weakness.  EXAM: CHEST  2 VIEW  COMPARISON:  06/16/2015  FINDINGS: Moderate right-sided pleural effusion, significantly increased since prior study. Mediastinal shift towards the left. Heart size is probably normal. Minimal left basilar subsegmental atelectasis. Surgical clips are present in the upper abdomen.  IMPRESSION: Reaccumulation of right pleural fluid.  Minimal left lower lobe atelectasis.   Electronically Signed   By: Nolon Nations M.D.   On: 07/03/2015 14:32   US Thoracentesis Asp Pleural Space W/img  Guide  07/04/2015   INDICATION: Cirrhosis, dyspnea, prior history of breast cancer, recurrent right pleural effusion/ probable right hepatic hydrothorax ; request is made for diagnostic and therapeutic right thoracentesis.  EXAM: ULTRASOUND GUIDED DIAGNOSTIC AND THERAPEUTIC RIGHT THORACENTESIS  COMPARISON:  Prior thoracentesis on 06/16/2015  MEDICATIONS: None  COMPLICATIONS: None immediate  TECHNIQUE: Informed written consent was obtained from the patient after a discussion of the risks, benefits and alternatives to treatment. A timeout was performed prior to the initiation of the procedure.  Initial ultrasound scanning demonstrates a large right pleural effusion. The lower chest was prepped and draped in the usual sterile fashion. 1% lidocaine was used for local anesthesia.  An ultrasound image was saved for documentation purposes. A 6 Fr Safe-T-Centesis catheter was introduced. The thoracentesis was performed. The catheter was removed and a dressing was applied. The patient tolerated the procedure well without immediate post procedural complication. The patient was escorted to have an upright chest radiograph.  FINDINGS: A total of approximately 2.2 liters of hazy, yellow fluid was removed. Requested samples were sent to the laboratory. Only the above amount of fluid was removed at this time secondary to increased patient coughing.  IMPRESSION: Successful ultrasound-guided diagnostic and therapeutic right sided thoracentesis yielding 2.2 liters of pleural fluid.  Read by: Rowe Robert, PA-C   Electronically Signed   By: Jacqulynn Cadet M.D.   On: 07/04/2015 13:05    Scheduled Meds: . folic acid  1 mg Oral Daily  . furosemide  40 mg Oral Once  . lidocaine (PF)      . metoprolol  12.5 mg Oral BID  . pantoprazole  40 mg Oral Daily  . spironolactone  100 mg Oral Daily  . thiamine  100 mg Oral Daily    Continuous Infusions:    Time spent: 68mins  Wafa Martes MD, PhD  [facility] Pager [phone].  If 7PM-7AM, please contact night-coverage at [email or web address], password [facility] 07/04/2015, 2:07 PM  LOS: 1 day

## 2015-07-05 ENCOUNTER — Encounter (HOSPITAL_COMMUNITY): Payer: Self-pay | Admitting: Physician Assistant

## 2015-07-05 DIAGNOSIS — K766 Portal hypertension: Secondary | ICD-10-CM

## 2015-07-05 DIAGNOSIS — K769 Liver disease, unspecified: Secondary | ICD-10-CM

## 2015-07-05 DIAGNOSIS — J948 Other specified pleural conditions: Principal | ICD-10-CM

## 2015-07-05 DIAGNOSIS — K7031 Alcoholic cirrhosis of liver with ascites: Secondary | ICD-10-CM

## 2015-07-05 DIAGNOSIS — I9589 Other hypotension: Secondary | ICD-10-CM

## 2015-07-05 LAB — COMPREHENSIVE METABOLIC PANEL
ALK PHOS: 135 U/L — AB (ref 38–126)
ALT: 20 U/L (ref 14–54)
AST: 53 U/L — AB (ref 15–41)
Albumin: 1.6 g/dL — ABNORMAL LOW (ref 3.5–5.0)
Anion gap: 7 (ref 5–15)
BUN: 32 mg/dL — AB (ref 6–20)
CALCIUM: 7.8 mg/dL — AB (ref 8.9–10.3)
CHLORIDE: 112 mmol/L — AB (ref 101–111)
CO2: 20 mmol/L — AB (ref 22–32)
CREATININE: 1.51 mg/dL — AB (ref 0.44–1.00)
GFR, EST AFRICAN AMERICAN: 43 mL/min — AB (ref >60)
GFR, EST NON AFRICAN AMERICAN: 37 mL/min — AB (ref >60)
Glucose, Bld: 79 mg/dL (ref 65–99)
Potassium: 4.1 mmol/L (ref 3.5–5.1)
Sodium: 139 mmol/L (ref 135–145)
Total Bilirubin: 1.1 mg/dL (ref 0.3–1.2)
Total Protein: 4.7 g/dL — ABNORMAL LOW (ref 6.5–8.1)

## 2015-07-05 LAB — CBC
HCT: 29.2 % — ABNORMAL LOW (ref 36.0–46.0)
HEMOGLOBIN: 9.6 g/dL — AB (ref 12.0–15.0)
MCH: 26.7 pg (ref 26.0–34.0)
MCHC: 32.9 g/dL (ref 30.0–36.0)
MCV: 81.1 fL (ref 78.0–100.0)
PLATELETS: 164 10*3/uL (ref 150–400)
RBC: 3.6 MIL/uL — AB (ref 3.87–5.11)
RDW: 15.3 % (ref 11.5–15.5)
WBC: 10.2 10*3/uL (ref 4.0–10.5)

## 2015-07-05 MED ORDER — FUROSEMIDE 40 MG PO TABS
40.0000 mg | ORAL_TABLET | Freq: Every day | ORAL | Status: DC
  Filled 2015-07-05: qty 1

## 2015-07-05 MED ORDER — FUROSEMIDE 20 MG PO TABS
20.0000 mg | ORAL_TABLET | Freq: Every day | ORAL | Status: DC
  Administered 2015-07-06: 20 mg via ORAL
  Filled 2015-07-05: qty 1

## 2015-07-05 MED ORDER — NADOLOL 40 MG PO TABS
40.0000 mg | ORAL_TABLET | Freq: Every day | ORAL | Status: DC
Start: 2015-07-05 — End: 2015-07-05
  Filled 2015-07-05: qty 1

## 2015-07-05 MED ORDER — SPIRONOLACTONE 25 MG PO TABS
50.0000 mg | ORAL_TABLET | Freq: Every day | ORAL | Status: DC
  Administered 2015-07-06: 50 mg via ORAL
  Filled 2015-07-05: qty 2

## 2015-07-05 MED ORDER — MIDODRINE HCL 5 MG PO TABS
2.5000 mg | ORAL_TABLET | Freq: Three times a day (TID) | ORAL | Status: DC
  Administered 2015-07-05 – 2015-07-07 (×5): 2.5 mg via ORAL
  Filled 2015-07-05 (×5): qty 1

## 2015-07-05 NOTE — Progress Notes (Signed)
PROGRESS NOTE  Savannah Jackson [id number] DOB: 06/01/1956 DOA: 07/03/2015 PCP: Rosita Fire, MD  HPI/Recap of past 24 hours:  Feeling better after thoracentesis done on 9/4, sitting up in chair on 3liter oxygen, no cough noted during encounter, patient denies ab pain, no fever, no n/v.  daughter and son in law in room  Assessment/Plan: Active Problems:   Pleural effusion associated with hepatic disorder   Acute respiratory distress  Recurrent hepatic hydrothorax: thoracentesis for symptom relieve, appreciate IR input. Lasix/spironolectone/oxygen supplement. GI consulted for further recommendation? Tips? Liver transplant? Appreciate GI input.  Alcohol cirrhosis:  with h/i gi bleed, per GI note, patient has portal hypertension and varices,  ? history of possible hepatic encephalopathy, not on meds. No prior history of SBP. Currently no confusion. Reported stop drinking 4 months.  H/o HTN:  Now hypotension, d/c lisinopril. D/c betablocker, lower diuretics dose to spironolactone 50mg /lasix 20mg . Start midodrine for bp support. Start nadolol if bp stable.    Code Status: full  Family Communication: patient and daughter  Disposition Plan: remain in the hospital   Consultants:  GI  IR  Procedures:  US guided thoracentesis  Antibiotics:  none   Objective: BP 83/51 mmHg  Pulse 66  Temp(Src) 97.9 F (36.6 C) (Oral)  Resp 16  Ht 5\' 3"  (1.6 m)  Wt 157 lb (71.215 kg)  BMI 27.82 kg/m2  SpO2 100%  Intake/Output Summary (Last 24 hours) at 07/05/15 1412 Last data filed at 07/05/15 1342  Gross per 24 hour  Intake    460 ml  Output    325 ml  Net    135 ml   Filed Weights   07/03/15 1313 07/03/15 1837  Weight: 157 lb (71.215 kg) 157 lb (71.215 kg)    Exam:   General:  NAD, sitting in chair  Cardiovascular: RRR  Respiratory: diminished on the right  Abdomen: Soft/ND/NT, positive BS  Musculoskeletal: No Edema  Neuro: aaox3  Data  Reviewed: Basic Metabolic Panel:  Recent Labs Lab 07/03/15 1330 07/04/15 0853 07/05/15 0510  NA 138 141 139  K 3.6 4.4 4.1  CL 110 114* 112*  CO2 21* 22 20*  GLUCOSE 104* 95 79  BUN 33* 28* 32*  CREATININE 1.44* 1.20* 1.51*  CALCIUM 7.8* 8.3* 7.8*   Liver Function Tests:  Recent Labs Lab 07/03/15 1330 07/05/15 0510  AST 65* 53*  ALT 24 20  ALKPHOS 165* 135*  BILITOT 1.4* 1.1  PROT 5.7* 4.7*  ALBUMIN 2.3* 1.6*   No results for input(s): LIPASE, AMYLASE in the last 168 hours. No results for input(s): AMMONIA in the last 168 hours. CBC:  Recent Labs Lab 07/03/15 1330 07/04/15 0853 07/05/15 0510  WBC 11.3* 10.7* 10.2  NEUTROABS 6.5  --   --   HGB 10.8* 9.8* 9.6*  HCT 31.9* 31.0* 29.2*  MCV 80.4 81.6 81.1  PLT 200 178 164   Cardiac Enzymes:   No results for input(s): CKTOTAL, CKMB, CKMBINDEX, TROPONINI in the last 168 hours. BNP (last 3 results)  Recent Labs  07/03/15 1330  BNP 241.0*    ProBNP (last 3 results) No results for input(s): PROBNP in the last 8760 hours.  CBG: No results for input(s): GLUCAP in the last 168 hours.  Recent Results (from the past 240 hour(s))  Gram stain     Status: None   Collection Time: 07/04/15 12:37 PM  Result Value Ref Range Status   Specimen Description FLUID RIGHT PLEURAL  Final   Special Requests NONE  Final   Gram Stain   Final    ABUNDANT WBC PRESENT,BOTH PMN AND MONONUCLEAR NO ORGANISMS SEEN GRAM STAIN REVIEWED-AGREE WITH RESULT M VESTAL    Report Status 07/04/2015 FINAL  Final     Studies: No results found.  Scheduled Meds: . folic acid  1 mg Oral Daily  . [START ON 07/06/2015] furosemide  20 mg Oral Daily  . midodrine  2.5 mg Oral TID WC  . pantoprazole  40 mg Oral Daily  . [START ON 07/06/2015] spironolactone  50 mg Oral Daily  . thiamine  100 mg Oral Daily    Continuous Infusions:    Time spent: 32mins  Kaylyne Axton MD, PhD  [facility] Pager [phone]. If 7PM-7AM, please contact  night-coverage at [email or web address], password [facility] 07/05/2015, 2:12 PM  LOS: 2 days

## 2015-07-05 NOTE — Consult Note (Signed)
[facility] Consult: 8:17 AM 07/05/2015  LOS: 2 days    Referring Provider: Dr Erlinda Hong  Primary Care Physician:  Rosita Fire, MD Primary Gastroenterologist:  Dr. Shelva Majestic   Reason for Consultation:  Assist with management of hepatic hydrothorax with right pleural effusions.   HPI: Savannah Jackson is a 59 y.o. female.  Pt with decompensated ETOH cirrhosis (hepatitis serologies negative in 06/2012) .Marland Kitchen  Moderate ascites per 04/21/15 CT.  Diverticulitis treated 01/2015.  S/p cholecystectomy.  Right breast cancer, s/p Lumpectomy.  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 DDD.  CKD stage 3.  No issues with hyponatremia or other electrolyte disturbances. AST> ALT though purportedly abstinent for perhaps 2.5 months.    04/22/15 EGD for UGI bleed (CG emesis): protal gastropathy, 3 columns grade 1 esophageal varices.  No ulcers.  Possibly bled from now healed MWT.  04/11/2015 Colonoscopy, diagnostic study to follow up hx diverticulitis to cecum.  Pancolonic diverticulosis. 8-10 mm sessile polyp removed from cecum.  3 other polyps removed (2 ascending, 1 splenic flexure).  Mild ext rrhoids.  All polyps were tubular adenomas, next colonoscopy 5 yrs. .  03/2013 Colonoscopy with removal of 4 tubular adenomas  Went to [facility] c/o progressive SOB and documented hypoxia.  She is not on home oxygen, and nasal cannula oxygen has been initiated during this admission.  Last thora was 8/17.  Now under consideration for TIPS to address the recurrent effusions so transferred to [facility]. S/p another 2.2 liter thora yesterday.  Home meds currently do not include any diuretics. Med list as of 06/15/15 office visit show patient taking 100 mg  Aldactone and 40 mg of Lasix daily. The patient seems to be a reliable historian and does not recall ever taking Aldactone but does recall the furosemide.  However she said she was told to stop this by Dr. Laural Golden may be 4 weeks ago. She was also advised to limit by mouth intake of fluids.  On daily Omeprozole.  Her beta blocker is metoprolol.   + 16 # weight loss in previous 8 weeks. + anorexia but no nausea or vomiting..  + DOE.  Quit ETOH ~ 02/2015 but had a brief relapse in 03/2015. Marland Kitchen  Tooth infection treated with Abx in early August. She did not take the suggested ibuprofen. She does take the oxycodone reliably at bedtime, otherwise she has difficulty sleeping because of back pain and just general pain.  Despite this she says she really doesn't get a very good night sleep.    Past Medical History  Diagnosis Date  . Hypertension   . Brain aneurysm   . Rotator cuff syndrome of left shoulder   . DDD (degenerative disc disease), lumbar   . Chronic back pain Diverticultis  . Cirrhosis     alcoholic  . Sleep apnea   . ETOH abuse   . Bilateral leg edema   . Hepatomegaly     scope 2014  . Diverticulosis     scope 2014  .  Cancer     breast cancer    Past Surgical History  Procedure Laterality Date  . Cholecystectomy    . Brain surgery    . Breast surgery    . Total knee arthroplasty      right. 2002  . Cataract extraction Bilateral     [facility] 2 or 3 years ago  . Colonoscopy N/A 04/10/2013    Procedure: COLONOSCOPY;  Surgeon: Rogene Houston, MD;  Location: [facility];  Service: Endoscopy;  Laterality: N/A;  1030-rescheduled to 1200 Ann notified pt  . Esophagogastroduodenoscopy N/A 04/22/2015    Procedure: ESOPHAGOGASTRODUODENOSCOPY (EGD);  Surgeon: Rogene Houston, MD;  Location: [facility];  Service: Endoscopy;  Laterality: N/A;    Prior to Admission medications   Medication Sig Start Date End Date Taking? Authorizing Provider  folic acid (FOLVITE) 1 MG tablet Take 1 tablet (1  mg total) by mouth daily. 04/29/15  Yes Rosita Fire, MD  lisinopril (PRINIVIL,ZESTRIL) 20 MG tablet Take 1 tablet (20 mg total) by mouth daily. 07/18/12  Yes Rosita Fire, MD  metoprolol (LOPRESSOR) 50 MG tablet Take 50 mg by mouth 2 (two) times daily.   Yes Historical Provider, MD  omeprazole (PRILOSEC) 40 MG capsule Take 40 mg by mouth daily.   Yes Historical Provider, MD  ondansetron (ZOFRAN) 4 MG tablet Take 4 mg by mouth every 8 (eight) hours as needed for nausea or vomiting.   Yes Historical Provider, MD  oxyCODONE (OXY IR/ROXICODONE) 5 MG immediate release tablet Take 1 tablet (5 mg total) by mouth 3 (three) times daily as needed for severe pain. 06/15/15  Yes Rogene Houston, MD  pantoprazole (PROTONIX) 40 MG tablet Take 1 tablet (40 mg total) by mouth daily. 04/29/15  Yes Rosita Fire, MD  Potassium Chloride ER 20 MEQ TBCR Take 20 mEq by mouth daily. 05/13/15  Yes Rogene Houston, MD  thiamine 100 MG tablet Take 1 tablet (100 mg total) by mouth daily. 04/29/15  Yes Rosita Fire, MD    Scheduled Meds: . folic acid  1 mg Oral Daily  . metoprolol  12.5 mg Oral BID  . pantoprazole  40 mg Oral Daily  . spironolactone  100 mg Oral Daily  . thiamine  100 mg Oral Daily   Infusions:   PRN Meds: alum & mag hydroxide-simeth, benzonatate, ondansetron, oxyCODONE   Allergies as of 07/03/2015 - Review Complete 07/03/2015  Allergen Reaction Noted  . Latex Swelling 07/15/2012  . Vicodin [hydrocodone-acetaminophen] Itching 09/06/2012    History reviewed. No pertinent family history.  Social History   Social History  . Marital Status: Married    Spouse Name: N/A  . Number of Children: N/A  . Years of Education: N/A   Occupational History  . Not on file.   Social History Main Topics  . Smoking status: Never Smoker   . Smokeless tobacco: Never Used  . Alcohol Use: 0.0 oz/week    0 Standard drinks or equivalent per week     Comment: none since 03/2015  . Drug Use: No  . Sexual  Activity: Yes    Birth Control/ Protection: None   Other Topics Concern  . Not on file   Social History Narrative    REVIEW OF SYSTEMS: Constitutional:  Generally weak because of her dyspnea.  Weight dropped from 173 # to 157# in the last 2 months.  ENT:  No nose bleeds although she is having some bleeding now associated with the nasal oxygen Pulm:  Nonproductive cough. DOE. CV:  No palpitations, No chest pain .  Previous lower extremity edema present. Months ago has resolved.  GU:  No hematuria, no frequency GI:  Per HPI.  No dysphagia. No recent bleeding per rectum.  Heme:  No issues with excessive bleeding or bruising.   Transfusions:  None.  Neuro:  No headaches, no peripheral tingling or numbness Derm:  No itching, no rash or sores.  Endocrine:  No sweats or chills.  No polyuria or dysuria Immunization:  Did not inquire.  Travel:  None beyond local counties in last few months.    PHYSICAL EXAM: Vital signs in last 24 hours: Filed Vitals:   07/05/15 0629  BP: 91/56  Pulse: 63  Temp: 98.6 F (37 C)  Resp: 16   Wt Readings from Last 3 Encounters:  07/03/15 157 lb (71.215 kg)  06/15/15 157 lb 12.8 oz (71.578 kg)  05/13/15 173 lb 9.6 oz (78.744 kg)    General: Pleasant, looks older than stated age. She is comfortable and alert.  Head:  No facial asymmetry or swelling.  Eyes:  No scleral icterus or conjunctival pallor. EOMI.  Ears:  Not HOH  Nose:  Slight bleeding from what looks like a slight erosion of the skin just exterior to the middle of the nose.  Mouth:  Dentition in fair repair. Moist, clear and pink oral mucosa.  Neck:  No JVD, no TMG, no masses.  Lungs:  Breath sounds diminished at the bases. No adventitious sounds. She has a dry cough which worsens with deep inspiration. Somewhat dyspneic with prolonged speaking Heart: RRR. No MRG. S1/S2 audible  Abdomen:  Soft, NT, ND, no HSM, no bruits, no masses, no hernias.   Rectal: Deferred   Musc/Skeltl: No joint  deformity, contractures or swelling.  Extremities:  No CCE.  Neurologic:  Oriented 3. No asterixis or tremor. Moves all 4 limbs, strength not tested but no gross deficits. Able to walk unassisted.  Skin:  No telangiectasia, sores or rashes.  Tattoos:  None seen.  Nodes:  No cervical or inguinal adenopathy.   Psych:  Calm, cooperative    Intake/Output from previous day: 09/04 0701 - 09/05 0700 In: 220 [P.O.:220] Out: 375 [Urine:375] Intake/Output this shift:    LAB RESULTS:  Recent Labs  07/03/15 1330 07/04/15 0853 07/05/15 0510  WBC 11.3* 10.7* 10.2  HGB 10.8* 9.8* 9.6*  HCT 31.9* 31.0* 29.2*  PLT 200 178 164   BMET Lab Results  Component Value Date   NA 139 07/05/2015   NA 141 07/04/2015   NA 138 07/03/2015   K 4.1 07/05/2015   K 4.4 07/04/2015   K 3.6 07/03/2015   CL 112* 07/05/2015   CL 114* 07/04/2015   CL 110 07/03/2015   CO2 20* 07/05/2015   CO2 22 07/04/2015   CO2 21* 07/03/2015   GLUCOSE 79 07/05/2015   GLUCOSE 95 07/04/2015   GLUCOSE 104* 07/03/2015   BUN 32* 07/05/2015   BUN 28* 07/04/2015   BUN 33* 07/03/2015   CREATININE 1.51* 07/05/2015   CREATININE 1.20* 07/04/2015   CREATININE 1.44* 07/03/2015   CALCIUM 7.8* 07/05/2015   CALCIUM 8.3* 07/04/2015   CALCIUM 7.8* 07/03/2015   LFT  Recent Labs  07/03/15 1330 07/05/15 0510  PROT 5.7* 4.7*  ALBUMIN 2.3* 1.6*  AST 65* 53*  ALT 24 20  ALKPHOS 165* 135*  BILITOT 1.4* 1.1   PT/INR Lab Results  Component Value Date   INR 1.59* 07/03/2015   INR  1.36 05/13/2015   INR 1.49 04/29/2015   Hepatitis Panel No results for input(s): HEPBSAG, HCVAB, HEPAIGM, HEPBIGM in the last 72 hours. C-Diff No components found for: CDIFF Lipase     Component Value Date/Time   LIPASE 32 06/15/2015 1504    Drugs of Abuse  No results found for: LABOPIA, COCAINSCRNUR, LABBENZ, AMPHETMU, THCU, LABBARB   RADIOLOGY STUDIES: Dg Chest 1 View  07/04/2015   CLINICAL DATA:  Status post right thoracentesis.   EXAM: CHEST  1 VIEW  COMPARISON:  07/03/2015  FINDINGS: There is a moderate right pleural effusion which has decreased in size compared with 07/03/2015. There is no right pneumothorax. There is no left pleural effusion. There is no left pneumothorax. There is no focal consolidation. The heart and mediastinal contours are stable.  The osseous structures are unremarkable.  IMPRESSION: Moderate right pleural effusion without a pneumothorax.   Electronically Signed   By: Kathreen Devoid   On: 07/04/2015 13:32   Dg Chest 2 View  07/03/2015   CLINICAL DATA:  Shortness of breath and cough for 1 week. Fever earlier in the week. Recent admission. Patient had fluid drawn from the right lung many times. Weakness.  EXAM: CHEST  2 VIEW  COMPARISON:  06/16/2015  FINDINGS: Moderate right-sided pleural effusion, significantly increased since prior study. Mediastinal shift towards the left. Heart size is probably normal. Minimal left basilar subsegmental atelectasis. Surgical clips are present in the upper abdomen.  IMPRESSION: Reaccumulation of right pleural fluid.  Minimal left lower lobe atelectasis.   Electronically Signed   By: Nolon Nations M.D.   On: 07/03/2015 14:32   US Thoracentesis Asp Pleural Space W/img Guide  07/04/2015   INDICATION: Cirrhosis, dyspnea, prior history of breast cancer, recurrent right pleural effusion/ probable right hepatic hydrothorax ; request is made for diagnostic and therapeutic right thoracentesis.  EXAM: ULTRASOUND GUIDED DIAGNOSTIC AND THERAPEUTIC RIGHT THORACENTESIS  COMPARISON:  Prior thoracentesis on 06/16/2015  MEDICATIONS: None  COMPLICATIONS: None immediate  TECHNIQUE: Informed written consent was obtained from the patient after a discussion of the risks, benefits and alternatives to treatment. A timeout was performed prior to the initiation of the procedure.  Initial ultrasound scanning demonstrates a large right pleural effusion. The lower chest was prepped and draped in the usual  sterile fashion. 1% lidocaine was used for local anesthesia.  An ultrasound image was saved for documentation purposes. A 6 Fr Safe-T-Centesis catheter was introduced. The thoracentesis was performed. The catheter was removed and a dressing was applied. The patient tolerated the procedure well without immediate post procedural complication. The patient was escorted to have an upright chest radiograph.  FINDINGS: A total of approximately 2.2 liters of hazy, yellow fluid was removed. Requested samples were sent to the laboratory. Only the above amount of fluid was removed at this time secondary to increased patient coughing.  IMPRESSION: Successful ultrasound-guided diagnostic and therapeutic right sided thoracentesis yielding 2.2 liters of pleural fluid.  Read by: Rowe Robert, PA-C   Electronically Signed   By: Jacqulynn Cadet M.D.   On: 07/04/2015 13:05    ENDOSCOPIC STUDIES: Per HPI  IMPRESSION:   *  Hepatic hydrothorax requiring repeated thoracenteses.  *  ETOH cirrhosis.  Complications include the hepatic hydrothorax, ascites (never required paracentesis), coagulopathy  *  Hx adenomatous colon polyps  *  CKD, labs stable  *  Hx breast cancer.  No malignant cells on pleural fluid 6/22.     PLAN:     *  Discussed with Dr. Hilarie Fredrickson.  We will restart Aldactone/furosemide. We will switch metoprolol to nadolol, equivalent dose would be 80 mg of nadolol daily. For now we will start with 40 mg as blood pressures are already soft in the 90s/ 50s.  *  Wonder if we should go ahead and obtain interventional radiology input in case she ends up needing to hips. May be kinder to wait until tomorrow when radiology is fully staffed.   Azucena Freed  07/05/2015, 8:17 AM Pager: [phone]

## 2015-07-06 ENCOUNTER — Inpatient Hospital Stay (HOSPITAL_COMMUNITY): Payer: Commercial Managed Care - HMO

## 2015-07-06 DIAGNOSIS — K7031 Alcoholic cirrhosis of liver with ascites: Secondary | ICD-10-CM | POA: Insufficient documentation

## 2015-07-06 DIAGNOSIS — R0600 Dyspnea, unspecified: Secondary | ICD-10-CM

## 2015-07-06 LAB — COMPREHENSIVE METABOLIC PANEL
ALT: 26 U/L (ref 14–54)
AST: 70 U/L — AB (ref 15–41)
Albumin: 1.8 g/dL — ABNORMAL LOW (ref 3.5–5.0)
Alkaline Phosphatase: 158 U/L — ABNORMAL HIGH (ref 38–126)
Anion gap: 9 (ref 5–15)
BILIRUBIN TOTAL: 1.2 mg/dL (ref 0.3–1.2)
BUN: 30 mg/dL — AB (ref 6–20)
CALCIUM: 8.1 mg/dL — AB (ref 8.9–10.3)
CO2: 19 mmol/L — ABNORMAL LOW (ref 22–32)
CREATININE: 1.49 mg/dL — AB (ref 0.44–1.00)
Chloride: 111 mmol/L (ref 101–111)
GFR calc Af Amer: 43 mL/min — ABNORMAL LOW (ref >60)
GFR, EST NON AFRICAN AMERICAN: 37 mL/min — AB (ref >60)
Glucose, Bld: 93 mg/dL (ref 65–99)
Potassium: 4.5 mmol/L (ref 3.5–5.1)
Sodium: 139 mmol/L (ref 135–145)
TOTAL PROTEIN: 5.4 g/dL — AB (ref 6.5–8.1)

## 2015-07-06 LAB — CBC
HEMATOCRIT: 33 % — AB (ref 36.0–46.0)
Hemoglobin: 11.3 g/dL — ABNORMAL LOW (ref 12.0–15.0)
MCH: 27.3 pg (ref 26.0–34.0)
MCHC: 34.2 g/dL (ref 30.0–36.0)
MCV: 79.7 fL (ref 78.0–100.0)
Platelets: 127 10*3/uL — ABNORMAL LOW (ref 150–400)
RBC: 4.14 MIL/uL (ref 3.87–5.11)
RDW: 15.3 % (ref 11.5–15.5)
WBC: 10.1 10*3/uL (ref 4.0–10.5)

## 2015-07-06 LAB — MAGNESIUM: MAGNESIUM: 1.9 mg/dL (ref 1.7–2.4)

## 2015-07-06 MED ORDER — ENOXAPARIN SODIUM 40 MG/0.4ML ~~LOC~~ SOLN
40.0000 mg | SUBCUTANEOUS | Status: DC
  Administered 2015-07-06 – 2015-07-14 (×9): 40 mg via SUBCUTANEOUS
  Filled 2015-07-06 (×9): qty 0.4

## 2015-07-06 MED ORDER — SPIRONOLACTONE 25 MG PO TABS
100.0000 mg | ORAL_TABLET | Freq: Every day | ORAL | Status: DC
  Administered 2015-07-07 – 2015-07-09 (×3): 100 mg via ORAL
  Filled 2015-07-06 (×3): qty 4

## 2015-07-06 MED ORDER — FUROSEMIDE 40 MG PO TABS
40.0000 mg | ORAL_TABLET | Freq: Every day | ORAL | Status: DC
  Administered 2015-07-07 – 2015-07-09 (×3): 40 mg via ORAL
  Filled 2015-07-06 (×3): qty 1

## 2015-07-06 NOTE — Care Management Note (Signed)
Case Management Note  Patient Details  Name: [name] MRN: [id number] Date of Birth: 07-15-1956  Subjective/Objective:  Patient lives with spouse, patient indep.  Patient conts to be on oxygen at 3 liters, will try to wean down.  Patient has insurance and pcp and transport at [facility].    NCM will cont to follow for dc needs.                   Action/Plan:   Expected Discharge Date:                  Expected Discharge Plan:  Home/Self Care  In-House Referral:     Discharge planning Services  CM Consult  Post Acute Care Choice:    Choice offered to:     DME Arranged:    DME Agency:     HH Arranged:    HH Agency:     Status of Service:  In process, will continue to follow  Medicare Important Message Given:  Yes-second notification given Date Medicare IM Given:    Medicare IM give by:    Date Additional Medicare IM Given:    Additional Medicare Important Message give by:     If discussed at [place] of Stay Meetings, dates discussed:    Additional Comments:  Zenon Mayo, RN 07/06/2015, 4:49 PM

## 2015-07-06 NOTE — Progress Notes (Signed)
      Progress Note   Subjective  Patient continues to feel about the same today. No acute changes. She reports breathing is stable. Afebrile. Eating okay.    Objective   Vital signs in last 24 hours: Temp:  [97.4 F (36.3 C)-98.2 F (36.8 C)] 97.4 F (36.3 C) (09/06 0553) Pulse Rate:  [66-81] 70 (09/06 0553) Resp:  [16-22] 22 (09/06 0553) BP: (82-107)/(48-69) 91/56 mmHg (09/06 0553) SpO2:  [98 %-100 %] 98 % (09/06 0553) Last BM Date: 07/05/15 General:    African American female in NAD Heart:  Regular rate and rhythm; no murmurs Lungs: Respirations  unlabored, decreased BS in R side at mid lung and inferior Abdomen:  Soft, nontender and nondistended. Positive fluid wave. Normal bowel sounds. Extremities:  Trace edema B LE. Neurologic:  Alert and oriented,  grossly normal neurologically. Negative asterixis Psych:  Cooperative. Normal mood and affect.  Intake/Output from previous day: 09/05 0701 - 09/06 0700 In: 360 [P.O.:360] Out: 300 [Urine:300] Intake/Output this shift: Total I/O In: 120 [P.O.:120] Out: -   Lab Results:  Recent Labs  07/04/15 0853 07/05/15 0510 07/06/15 0636  WBC 10.7* 10.2 10.1  HGB 9.8* 9.6* 11.3*  HCT 31.0* 29.2* 33.0*  PLT 178 164 127*   BMET  Recent Labs  07/04/15 0853 07/05/15 0510 07/06/15 0636  NA 141 139 139  K 4.4 4.1 4.5  CL 114* 112* 111  CO2 22 20* 19*  GLUCOSE 95 79 93  BUN 28* 32* 30*  CREATININE 1.20* 1.51* 1.49*  CALCIUM 8.3* 7.8* 8.1*   LFT  Recent Labs  07/06/15 0636  PROT 5.4*  ALBUMIN 1.8*  AST 70*  ALT 26  ALKPHOS 158*  BILITOT 1.2   PT/INR  Recent Labs  07/03/15 1330  LABPROT 19.0*  INR 1.59*         Assessment / Plan:   59 y/o female with alcohol cirrhosis complicated by ascites and hepatic hydrothorax, admitted for worsening dyspnea / hydrothorax. No evidence of infection from pleural fluid although awaiting culture results (pleural PMN count 242). Afebrile. No evidence of  encephalopathy. Patient with CKD and coagulopathy, MELD of 16 today. No prior evaluation for liver transplant.   Hepatic hydrothorax / Cirrhosis - recommend optimization of diuretics as her renal function allows and strict < 2gm sodium restricted diet. Recommend aldactone 100mg  / lasix 40mg  daily for now, and monitor renal function closely. Await echocardiogram results. Would reserve TIPS for hydrothorax refractory to diuretics or if she cannot tolerate diuretics. MELD is 16 and she is a transplant candidate with this issue, she should have a hepatology transplant evaluation as an outpatient. Continue nadolol as tolerated for esophageal varices. Await culture results from recent thoracentesis but if patient has fever or abdominal pain moving forward would have low threshold for paracentesis / repeat thoracentesis given PMN count.  Counseled patient on strict alcohol abstinence for this issue and for her candidacy long term for transplant, which she reports she has been doing for the past few months.   VTE prophylaxis - okay to give lovenox or heparin as you deem appropriate  Active Problems:   Pleural effusion associated with hepatic disorder   Acute respiratory distress    LOS: 3 days   Please call with questions / concerns, or changes in status.  Manus Gunning, MD  07/06/2015, 11:30 AM Pager [phone]

## 2015-07-06 NOTE — Care Management Important Message (Signed)
Important Message  Patient Details  Name: Savannah Jackson MRN: [id number] Date of Birth: [birth year]   Medicare Important Message Given:  Yes-second notification given    Nathen May 07/06/2015, 11:57 AMImportant Message  Patient Details  Name: Savannah Jackson MRN: [id number] Date of Birth: 06-08-1956   Medicare Important Message Given:  Yes-second notification given    Nathen May 07/06/2015, 11:57 AM

## 2015-07-06 NOTE — Progress Notes (Addendum)
PROGRESS NOTE  Savannah Jackson [id number] DOB: 11-23-1955 DOA: 07/03/2015 PCP: Rosita Fire, MD  HPI/Recap of past 24 hours:  Sitting on edge of the bed, reported dyspnea on exertion, feeling ok at rest on oxygen supplement, mild intermittent dry cough noticed during encounter. Denies ab pain, no fever.  no n/v. bp low normal, denies dizziness.   Assessment/Plan: Active Problems:   Pleural effusion associated with hepatic disorder   Acute respiratory distress  Recurrent hepatic hydrothorax:  thoracentesis for symptom relieve, appreciate IR input.  Lasix/spironolectone/oxygen supplement.  Echo grade 1 diastolic dysfunction, otherwise unremarkable. GI consulted for further recommendation? Pleurx? Tips? Liver transplant?  Alcohol cirrhosis/ascites:  with h/i gi bleed, per GI note, patient has portal hypertension and varices,  ? history of possible hepatic encephalopathy, not on meds. No prior history of SBP. Currently no confusion. Reported stop drinking 4 months.  H/o HTN:  Now hypotension,  d/c lisinopril. D/c betablocker,  lower diuretics dose to spironolactone 50mg /lasix 20mg .  Start midodrine for bp support. Start nadolol if bp stable.    Code Status: full  Family Communication: patient   Disposition Plan: remain in the hospital, awaiting for further GI recommendation, pleurx? Tips?   Consultants:  GI  IR  Procedures:  US guided thoracentesis  Antibiotics:  none   Objective: BP 91/56 mmHg  Pulse 70  Temp(Src) 97.4 F (36.3 C) (Oral)  Resp 22  Ht 5\' 3"  (1.6 m)  Wt 157 lb (71.215 kg)  BMI 27.82 kg/m2  SpO2 98%  Intake/Output Summary (Last 24 hours) at 07/06/15 0943 Last data filed at 07/06/15 0556  Gross per 24 hour  Intake    360 ml  Output    300 ml  Net     60 ml   Filed Weights   07/03/15 1313 07/03/15 1837  Weight: 157 lb (71.215 kg) 157 lb (71.215 kg)    Exam:   General:  NAD, sitting in chair  Cardiovascular:  RRR  Respiratory: diminished on the right  Abdomen: Soft/ND/NT, positive BS  Musculoskeletal: No Edema  Neuro: aaox3  Data Reviewed: Basic Metabolic Panel:  Recent Labs Lab 07/03/15 1330 07/04/15 0853 07/05/15 0510 07/06/15 0636  NA 138 141 139 139  K 3.6 4.4 4.1 4.5  CL 110 114* 112* 111  CO2 21* 22 20* 19*  GLUCOSE 104* 95 79 93  BUN 33* 28* 32* 30*  CREATININE 1.44* 1.20* 1.51* 1.49*  CALCIUM 7.8* 8.3* 7.8* 8.1*  MG  --   --   --  1.9   Liver Function Tests:  Recent Labs Lab 07/03/15 1330 07/05/15 0510 07/06/15 0636  AST 65* 53* 70*  ALT 24 20 26   ALKPHOS 165* 135* 158*  BILITOT 1.4* 1.1 1.2  PROT 5.7* 4.7* 5.4*  ALBUMIN 2.3* 1.6* 1.8*   No results for input(s): LIPASE, AMYLASE in the last 168 hours. No results for input(s): AMMONIA in the last 168 hours. CBC:  Recent Labs Lab 07/03/15 1330 07/04/15 0853 07/05/15 0510 07/06/15 0636  WBC 11.3* 10.7* 10.2 10.1  NEUTROABS 6.5  --   --   --   HGB 10.8* 9.8* 9.6* 11.3*  HCT 31.9* 31.0* 29.2* 33.0*  MCV 80.4 81.6 81.1 79.7  PLT 200 178 164 127*   Cardiac Enzymes:   No results for input(s): CKTOTAL, CKMB, CKMBINDEX, TROPONINI in the last 168 hours. BNP (last 3 results)  Recent Labs  07/03/15 1330  BNP 241.0*    ProBNP (last 3 results) No results for input(s): PROBNP  in the last 8760 hours.  CBG: No results for input(s): GLUCAP in the last 168 hours.  Recent Results (from the past 240 hour(s))  Culture, body fluid-bottle     Status: None (Preliminary result)   Collection Time: 07/04/15 12:37 PM  Result Value Ref Range Status   Specimen Description FLUID RIGHT PLEURAL  Final   Special Requests BOTTLES DRAWN AEROBIC AND ANAEROBIC 10CC  Final   Culture NO GROWTH 1 DAY  Final   Report Status PENDING  Incomplete  Gram stain     Status: None   Collection Time: 07/04/15 12:37 PM  Result Value Ref Range Status   Specimen Description FLUID RIGHT PLEURAL  Final   Special Requests NONE  Final    Gram Stain   Final    ABUNDANT WBC PRESENT,BOTH PMN AND MONONUCLEAR NO ORGANISMS SEEN GRAM STAIN REVIEWED-AGREE WITH RESULT M VESTAL    Report Status 07/04/2015 FINAL  Final     Studies: No results found.  Scheduled Meds: . folic acid  1 mg Oral Daily  . furosemide  20 mg Oral Daily  . midodrine  2.5 mg Oral TID WC  . pantoprazole  40 mg Oral Daily  . spironolactone  50 mg Oral Daily  . thiamine  100 mg Oral Daily    Continuous Infusions:    Time spent: 18mins  Gianella Chismar MD, PhD  [facility] Pager [phone]. If 7PM-7AM, please contact night-coverage at [email or web address], password [facility] 07/06/2015, 9:43 AM  LOS: 3 days

## 2015-07-06 NOTE — Progress Notes (Signed)
  Echocardiogram 2D Echocardiogram has been performed.  Savannah Jackson 07/06/2015, 12:39 PM

## 2015-07-07 ENCOUNTER — Inpatient Hospital Stay (HOSPITAL_COMMUNITY): Payer: Commercial Managed Care - HMO

## 2015-07-07 DIAGNOSIS — J90 Pleural effusion, not elsewhere classified: Secondary | ICD-10-CM | POA: Diagnosis present

## 2015-07-07 LAB — BASIC METABOLIC PANEL
ANION GAP: 7 (ref 5–15)
BUN: 33 mg/dL — ABNORMAL HIGH (ref 6–20)
CALCIUM: 8 mg/dL — AB (ref 8.9–10.3)
CO2: 21 mmol/L — AB (ref 22–32)
Chloride: 109 mmol/L (ref 101–111)
Creatinine, Ser: 1.48 mg/dL — ABNORMAL HIGH (ref 0.44–1.00)
GFR, EST AFRICAN AMERICAN: 44 mL/min — AB (ref >60)
GFR, EST NON AFRICAN AMERICAN: 38 mL/min — AB (ref >60)
Glucose, Bld: 93 mg/dL (ref 65–99)
POTASSIUM: 4.6 mmol/L (ref 3.5–5.1)
Sodium: 137 mmol/L (ref 135–145)

## 2015-07-07 LAB — PROTIME-INR
INR: 1.54 — ABNORMAL HIGH (ref 0.00–1.49)
PROTHROMBIN TIME: 18.5 s — AB (ref 11.6–15.2)

## 2015-07-07 LAB — PH, BODY FLUID: PH, BODY FLUID: 7.7

## 2015-07-07 LAB — PATHOLOGIST SMEAR REVIEW

## 2015-07-07 LAB — MAGNESIUM: Magnesium: 1.8 mg/dL (ref 1.7–2.4)

## 2015-07-07 NOTE — Progress Notes (Signed)
SATURATION QUALIFICATIONS: (This note is used to comply with regulatory documentation for home oxygen)  Patient Saturations on Room Air at Rest = 84%  Patient Saturations on Room Air while Ambulating = 82%  Patient Saturations on 3 Liters of oxygen while Ambulating = 92-93%  Please briefly explain why patient needs home oxygen: Pt. Unable to ambulate or stay adequately oxygenated w/o ambulating or sitting with O2-[place].

## 2015-07-07 NOTE — Progress Notes (Signed)
PROGRESS NOTE  Savannah Jackson DOB: [birth year] DOA: 07/03/2015 PCP: Rosita Fire, MD  HPI/Recap of past 24 hours:  reaccumulation of right sided pleural effusion on repeat chest x ray this am, mild intermittent dry cough noticed during encounter. Reported ambulated to the nursing station on oxygen supplement. Denies ab pain, no fever.  no n/v. bp low normal, denies dizziness.   Assessment/Plan: Active Problems:   Pleural effusion associated with hepatic disorder   Acute respiratory distress   Alcoholic cirrhosis of liver with ascites  Recurrent hepatic hydrothorax:  S/p recurrent thoracentesis q2wks since 03/2015, S/p thoracentesis in 9/4 again  , appreciate IR input. Culture no growth. Gram stain no organism seen. Lasix/spironolectone/oxygen supplement.   Echo grade 1 diastolic dysfunction, otherwise unremarkable. GI consulted for further recommendation? Pleurx? Tips? Liver transplant?  discussed with GI Dr. Havery Moros over the phone on 9/7, he is aware that patient has not been on nadolol due to borderline bp, he agree with midodrine if bp low. For now continue diuretic (aldecton 100mg , lasix 40mg ), monitor effect of diuretics, may need repeat thoracentesis in next 1-2 days , since repeat cxr showed reaccumulation of fluids.  Alcohol cirrhosis/ascites:  with h/i gi bleed, per GI note, patient has portal hypertension and varices,  ? history of possible hepatic encephalopathy, not on meds. No prior history of SBP. Currently no confusion. Reported stop drinking 4 months.  H/o HTN:  Now hypotension,  d/c lisinopril. D/c betablocker,  lower diuretics dose to spironolactone 50mg /lasix 20mg .  Was on midodrine on 9/5 and 9/6 and last dose of midodrine 9/7 am, midodrine was stopped, monitor bp , may need to restart for bp support. bp borderline, not able tolerate nadolol, has not been on nadolol this hospitalization.    Code Status: full, over all prognosis is  guarded.   Family Communication: patient   Disposition Plan: remain in the hospital, awaiting for further GI recommendation, pleurx? Tips?   Consultants:  GI  IR  Procedures:  US guided thoracentesis 9/4  Antibiotics:  none   Objective: BP 94/67 mmHg  Pulse 69  Temp(Src) 98.4 F (36.9 C) (Oral)  Resp 22  Ht 5\' 3"  (1.6 m)  Wt 157 lb (71.215 kg)  BMI 27.82 kg/m2  SpO2 99%  Intake/Output Summary (Last 24 hours) at 07/07/15 1310 Last data filed at 07/07/15 0952  Gross per 24 hour  Intake    120 ml  Output    400 ml  Net   -280 ml   Filed Weights   07/03/15 1313 07/03/15 1837  Weight: 157 lb (71.215 kg) 157 lb (71.215 kg)    Exam:   General:  NAD, sitting in chair  Cardiovascular: RRR  Respiratory: diminished on the right  Abdomen: Soft/ND/NT, positive BS  Musculoskeletal: No Edema  Neuro: aaox3  Data Reviewed: Basic Metabolic Panel:  Recent Labs Lab 07/03/15 1330 07/04/15 0853 07/05/15 0510 07/06/15 0636 07/07/15 0545  NA 138 141 139 139 137  K 3.6 4.4 4.1 4.5 4.6  CL 110 114* 112* 111 109  CO2 21* 22 20* 19* 21*  GLUCOSE 104* 95 79 93 93  BUN 33* 28* 32* 30* 33*  CREATININE 1.44* 1.20* 1.51* 1.49* 1.48*  CALCIUM 7.8* 8.3* 7.8* 8.1* 8.0*  MG  --   --   --  1.9 1.8   Liver Function Tests:  Recent Labs Lab 07/03/15 1330 07/05/15 0510 07/06/15 0636  AST 65* 53* 70*  ALT 24 20 26   ALKPHOS 165* 135* 158*  BILITOT 1.4* 1.1 1.2  PROT 5.7* 4.7* 5.4*  ALBUMIN 2.3* 1.6* 1.8*   No results for input(s): LIPASE, AMYLASE in the last 168 hours. No results for input(s): AMMONIA in the last 168 hours. CBC:  Recent Labs Lab 07/03/15 1330 07/04/15 0853 07/05/15 0510 07/06/15 0636  WBC 11.3* 10.7* 10.2 10.1  NEUTROABS 6.5  --   --   --   HGB 10.8* 9.8* 9.6* 11.3*  HCT 31.9* 31.0* 29.2* 33.0*  MCV 80.4 81.6 81.1 79.7  PLT 200 178 164 127*   Cardiac Enzymes:   No results for input(s): CKTOTAL, CKMB, CKMBINDEX, TROPONINI in the last  168 hours. BNP (last 3 results)  Recent Labs  07/03/15 1330  BNP 241.0*    ProBNP (last 3 results) No results for input(s): PROBNP in the last 8760 hours.  CBG: No results for input(s): GLUCAP in the last 168 hours.  Recent Results (from the past 240 hour(s))  Culture, body fluid-bottle     Status: None (Preliminary result)   Collection Time: 07/04/15 12:37 PM  Result Value Ref Range Status   Specimen Description FLUID RIGHT PLEURAL  Final   Special Requests BOTTLES DRAWN AEROBIC AND ANAEROBIC 10CC  Final   Culture NO GROWTH 2 DAYS  Final   Report Status PENDING  Incomplete  Gram stain     Status: None   Collection Time: 07/04/15 12:37 PM  Result Value Ref Range Status   Specimen Description FLUID RIGHT PLEURAL  Final   Special Requests NONE  Final   Gram Stain   Final    ABUNDANT WBC PRESENT,BOTH PMN AND MONONUCLEAR NO ORGANISMS SEEN GRAM STAIN REVIEWED-AGREE WITH RESULT M VESTAL    Report Status 07/04/2015 FINAL  Final     Studies: Dg Chest 2 View  07/07/2015   CLINICAL DATA:  History right-sided pleural effusion and recent thoracentesis with increasing shortness of Breath  EXAM: CHEST - 2 VIEW  COMPARISON:  07/04/2015  FINDINGS: There is been significant increase in the right-sided pleural effusion with total opacification of the right hemi thorax. The left lung remains clear. The cardiac shadow is stable. The bony structures are unremarkable.  IMPRESSION: Increase in right-sided pleural effusion with opacification of the right hemi thorax.  These results will be called to the ordering clinician or representative by the Radiologist Assistant, and communication documented in the PACS or zVision Dashboard.   Electronically Signed   By: Inez Catalina M.D.   On: 07/07/2015 12:50    Scheduled Meds: . enoxaparin (LOVENOX) injection  40 mg Subcutaneous Q24H  . folic acid  1 mg Oral Daily  . furosemide  40 mg Oral Daily  . pantoprazole  40 mg Oral Daily  . spironolactone  100  mg Oral Daily  . thiamine  100 mg Oral Daily    Continuous Infusions:    Time spent: 57mins  Jameson Tormey MD, PhD  [facility] Pager [phone]. If 7PM-7AM, please contact night-coverage at [email or web address], password [facility] 07/07/2015, 1:10 PM  LOS: 4 days

## 2015-07-07 NOTE — Consult Note (Signed)
Savannah Wilson, EdD 

## 2015-07-07 NOTE — Progress Notes (Signed)
Patient ID: Savannah Jackson, female   DOB: Oct 23, 1956, 59 y.o.   MRN: [id number]    Progress Note   Subjective  Sitting up in bed coughing, no increase in SOB-feels about the same. Trying to eat   Objective   Vital signs in last 24 hours: Temp:  [97.8 F (36.6 C)-98.7 F (37.1 C)] 98.4 F (36.9 C) (09/07 0518) Pulse Rate:  [67-91] 69 (09/07 0834) Resp:  [16-28] 22 (09/07 0518) BP: (94-111)/(55-67) 94/67 mmHg (09/07 0834) SpO2:  [99 %] 99 % (09/07 0518) Last BM Date: 07/06/15 General:  AA female in NAD Heart:  Regular rate and rhythm; no murmurs Lungs: Respirations even and unlabored, significant decrease in BS right lung Abdomen:  Soft, nontender and non tense ascites,. Normal bowel sounds. Extremities:  Without edema. Neurologic:  Alert and oriented,  grossly normal neurologically.no asterixis Psych:  Cooperative. Normal mood and affect.  Intake/Output from previous day: 09/06 0701 - 09/07 0700 In: 120 [P.O.:120] Out: 150 [Urine:150] Intake/Output this shift: Total I/O In: 120 [P.O.:120] Out: 250 [Urine:250]  Lab Results:  Recent Labs  07/05/15 0510 07/06/15 0636  WBC 10.2 10.1  HGB 9.6* 11.3*  HCT 29.2* 33.0*  PLT 164 127*   BMET  Recent Labs  07/05/15 0510 07/06/15 0636 07/07/15 0545  NA 139 139 137  K 4.1 4.5 4.6  CL 112* 111 109  CO2 20* 19* 21*  GLUCOSE 79 93 93  BUN 32* 30* 33*  CREATININE 1.51* 1.49* 1.48*  CALCIUM 7.8* 8.1* 8.0*   LFT  Recent Labs  07/06/15 0636  PROT 5.4*  ALBUMIN 1.8*  AST 70*  ALT 26  ALKPHOS 158*  BILITOT 1.2   PT/INR No results for input(s): LABPROT, INR in the last 72 hours.       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 MELD  16 . Not actively drinking  Current plan is to try to diurese to manage fluid, and if that fails the TIPS  Will ask IR to see to discuss TIPS but not schedule Check CXR today  Also she is  not being weighed and do not think I/Os accurate- will weigh daily and do strict I/Os  #2 CKD watch creat carefully #3 hx of varices- now on nadolol #4 pt on midodrine for BP support past couple days.. Would d/c so can monitor Bp with addition of nadolol  and diuretics   Active Problems:   Pleural effusion associated with hepatic disorder   Acute respiratory distress   Alcoholic cirrhosis of liver with ascites     LOS: 4 days   Amy Esterwood  07/07/2015, 10:05 AM   Attending Addendum: 59 y/o female with alcohol cirrhosis complicated by ascites and hepatic hydrothorax, admitted for worsening dyspnea / hydrothorax. No evidence of infection from pleural fluid although awaiting culture results (pleural PMN count 242). Afebrile. No evidence of encephalopathy. Patient with CKD and coagulopathy, MELD of 16. No prior evaluation for liver transplant.   No significant change in the past 24 hours, symptoms the same, on supplemental oxygen. The patient has not yet had regular dosing of diuretics. Recommend optimization of diuretics as her renal function allows, aldactone 100mg  / lasix 40mg  today, and titrate up as her renal function allows. Strict < 2gm sodium restricted diet should also help if she is compliant with it. Echocardiogram without significant abnormality. At this time recommend optimizing diuretics as she tolerates, however  TIPS would be considered for for hydrothorax refractory to diuretics or if she cannot tolerate diuretics. MELD is 16 and she is a transplant candidate with this issue, she should have a hepatology transplant evaluation as an outpatient. Continue nadolol as tolerated for esophageal varices. Await culture results from recent thoracentesis but if patient has fever or abdominal pain moving forward would have low threshold for paracentesis / repeat thoracentesis given PMN count. Counseled patient on strict alcohol abstinence for this issue and for her candidacy long term for  transplant, which she reports she has been doing for the past few months.    Please call with questions / concerns, or changes in status.  Harwood Cellar, MD  Pager [phone]

## 2015-07-08 DIAGNOSIS — J9601 Acute respiratory failure with hypoxia: Secondary | ICD-10-CM | POA: Insufficient documentation

## 2015-07-08 LAB — BASIC METABOLIC PANEL
ANION GAP: 6 (ref 5–15)
BUN: 36 mg/dL — ABNORMAL HIGH (ref 6–20)
CALCIUM: 8.2 mg/dL — AB (ref 8.9–10.3)
CO2: 23 mmol/L (ref 22–32)
Chloride: 108 mmol/L (ref 101–111)
Creatinine, Ser: 1.52 mg/dL — ABNORMAL HIGH (ref 0.44–1.00)
GFR, EST AFRICAN AMERICAN: 42 mL/min — AB (ref >60)
GFR, EST NON AFRICAN AMERICAN: 36 mL/min — AB (ref >60)
Glucose, Bld: 107 mg/dL — ABNORMAL HIGH (ref 65–99)
Potassium: 4.7 mmol/L (ref 3.5–5.1)
Sodium: 137 mmol/L (ref 135–145)

## 2015-07-08 LAB — COMPREHENSIVE METABOLIC PANEL
ALT: 23 U/L (ref 14–54)
AST: 67 U/L — AB (ref 15–41)
Albumin: 1.7 g/dL — ABNORMAL LOW (ref 3.5–5.0)
Alkaline Phosphatase: 144 U/L — ABNORMAL HIGH (ref 38–126)
Anion gap: 6 (ref 5–15)
BUN: 38 mg/dL — AB (ref 6–20)
CHLORIDE: 110 mmol/L (ref 101–111)
CO2: 21 mmol/L — ABNORMAL LOW (ref 22–32)
Calcium: 8.1 mg/dL — ABNORMAL LOW (ref 8.9–10.3)
Creatinine, Ser: 1.5 mg/dL — ABNORMAL HIGH (ref 0.44–1.00)
GFR calc Af Amer: 43 mL/min — ABNORMAL LOW (ref >60)
GFR, EST NON AFRICAN AMERICAN: 37 mL/min — AB (ref >60)
Glucose, Bld: 84 mg/dL (ref 65–99)
POTASSIUM: 5.5 mmol/L — AB (ref 3.5–5.1)
SODIUM: 137 mmol/L (ref 135–145)
Total Bilirubin: 1.5 mg/dL — ABNORMAL HIGH (ref 0.3–1.2)
Total Protein: 5.3 g/dL — ABNORMAL LOW (ref 6.5–8.1)

## 2015-07-08 LAB — MAGNESIUM: MAGNESIUM: 1.9 mg/dL (ref 1.7–2.4)

## 2015-07-08 LAB — TSH: TSH: 2.075 u[IU]/mL (ref 0.350–4.500)

## 2015-07-08 MED ORDER — NADOLOL 20 MG PO TABS: 20.0000 mg | ORAL_TABLET | Freq: Every day | ORAL | Status: DC

## 2015-07-08 MED ORDER — NADOLOL 20 MG PO TABS
10.0000 mg | ORAL_TABLET | Freq: Every day | ORAL | Status: DC
  Administered 2015-07-09 – 2015-07-18 (×9): 10 mg via ORAL
  Filled 2015-07-08 (×11): qty 1

## 2015-07-08 MED ORDER — FUROSEMIDE 40 MG PO TABS
40.0000 mg | ORAL_TABLET | Freq: Once | ORAL | Status: AC
  Administered 2015-07-08: 40 mg via ORAL
  Filled 2015-07-08: qty 1

## 2015-07-08 NOTE — Progress Notes (Signed)
Progress Note   Subjective  Patient reports no significant change in her breathing over night. She continues to have an oxygen requirement. No fevers or chest pain. She received first dose of lasix 40mg  and aldactone 100mg  yesterday, renal function pending.     Objective   Vital signs in last 24 hours: Temp:  [98.2 F (36.8 C)-98.7 F (37.1 C)] 98.7 F (37.1 C) (09/08 0648) Pulse Rate:  [70-86] 83 (09/08 0648) Resp:  [16-18] 16 (09/08 0648) BP: (99-108)/(57-70) 102/62 mmHg (09/08 0648) SpO2:  [99 %-100 %] 100 % (09/08 0648) Weight:  [152 lb 1.9 oz (69 kg)-153 lb 12.8 oz (69.763 kg)] 152 lb 1.9 oz (69 kg) (09/08 0300) Last BM Date: 07/06/15 General:    African american female in NAD Heart:  Regular rate and rhythm; 2/6 SEM Lungs: decreased BS on R side, clear on left Abdomen:  Soft, nontender and nondistended. Normal bowel sounds. Extremities:  Without edema. Neurologic:  Alert and oriented,  grossly normal neurologically. No asterixis. Psych:  Cooperative. Normal mood and affect.  Intake/Output from previous day: 09/07 0701 - 09/08 0700 In: 804 [P.O.:804] Out: 800 [Urine:800] Intake/Output this shift:    Lab Results:  Recent Labs  07/06/15 0636  WBC 10.1  HGB 11.3*  HCT 33.0*  PLT 127*   BMET  Recent Labs  07/06/15 0636 07/07/15 0545  NA 139 137  K 4.5 4.6  CL 111 109  CO2 19* 21*  GLUCOSE 93 93  BUN 30* 33*  CREATININE 1.49* 1.48*  CALCIUM 8.1* 8.0*   LFT  Recent Labs  07/06/15 0636  PROT 5.4*  ALBUMIN 1.8*  AST 70*  ALT 26  ALKPHOS 158*  BILITOT 1.2   PT/INR  Recent Labs  07/07/15 1406  LABPROT 18.5*  INR 1.54*    Studies/Results: Dg Chest 2 View  07/07/2015   CLINICAL DATA:  History right-sided pleural effusion and recent thoracentesis with increasing shortness of Breath  EXAM: CHEST - 2 VIEW  COMPARISON:  07/04/2015  FINDINGS: There is been significant increase in the right-sided pleural effusion with total opacification of  the right hemi thorax. The left lung remains clear. The cardiac shadow is stable. The bony structures are unremarkable.  IMPRESSION: Increase in right-sided pleural effusion with opacification of the right hemi thorax.  These results will be called to the ordering clinician or representative by the Radiologist Assistant, and communication documented in the PACS or zVision Dashboard.   Electronically Signed   By: Inez Catalina M.D.   On: 07/07/2015 12:50       Assessment / Plan:   59 y/o female with alcohol cirrhosis complicated by ascites and hepatic hydrothorax, admitted for worsening dyspnea / hydrothorax. No evidence of infection from pleural fluid although awaiting culture results (pleural PMN count 242). Afebrile. No evidence of encephalopathy. Patient with CKD and coagulopathy, MELD of 16. No prior evaluation for liver transplant.   No significant change since admission, symptoms the same, on supplemental oxygen. CXR yesterday shows re accumulation of effusion following her initial thoracentesis. The patient received her first dose of lasix 40mg  / aldactone 100mg  yesterday. Renal function for today is pending at this time.   Overall for hepatic hydrothorax recommend optimization of diuretics as her renal function allows. If renal function stable today would increase lasix to 40mg  BID and aldactone to 200mg . If renal function is worsening may need to back off diuretics and more strongly consider TIPS. Echocardiogram without significant abnormality. She is  scheduled to see IR today to discuss TIPS in case she needs it during this admission, however TIPS is typically reserved for those who do not respond to high dose diuretics or can't tolerate diuretics. Given she has not had a trial of higher dose diuretics, I would recommend this first. Hepatic hydrothorax tends to re-accumulate quickly following thoracentesis and would hope to manage this without repeated thoracentesis, although it depends on how  aggressive we can be with her diuretics and her oxygen requirement. I discussed these issues at length with her today. Otherwise continue strict < 2gm sodium restricted diet. MELD is 16 and she is a transplant candidate with this issue, she should have a hepatology transplant evaluation as an outpatient. Continue nadolol as tolerated for esophageal varices if blood pressure allows. If patient has fever or abdominal pain moving forward would have low threshold for paracentesis / repeat thoracentesis given PMN count.    Active Problems:   Pleural effusion associated with hepatic disorder   Acute respiratory distress   Alcoholic cirrhosis of liver with ascites   Recurrent right pleural effusion     LOS: 5 days   Savannah Jackson  07/08/2015, 8:43 AM   Please call with questions / concerns, or changes in status.   Cellar, MD  Pager [phone]

## 2015-07-08 NOTE — Progress Notes (Addendum)
PROGRESS NOTE  Savannah Jackson [id number] DOB: 06/03/1956 DOA: 07/03/2015 PCP: Rosita Fire, MD  HPI/Recap of past 24 hours:  Reported make more urine while on diuretics, mild intermittent dry cough noticed during encounter. Remain DOE.  Denies ab pain, no fever.  no n/v. bp low normal, denies dizziness.   Assessment/Plan: Active Problems:   Pleural effusion associated with hepatic disorder   Acute respiratory distress   Alcoholic cirrhosis of liver with ascites   Recurrent right pleural effusion  Recurrent hepatic hydrothorax:  S/p recurrent thoracentesis q2wks since 03/2015, S/p thoracentesis in 9/4 again  , appreciate IR input. Culture no growth. Gram stain no organism seen. Lasix/spironolectone/oxygen supplement.  Echo grade 1 diastolic dysfunction, otherwise unremarkable.   4:50pm Addendum: Dr. Havery Moros  called and recommended continue titrate diuretics, does not recommend repeat thoracentesis, will monitor effect of diuretics for a few days in the hospital, if patient does not tolerate then will proceed with TIPS. Dr. Havery Moros recommended additional dose of lasix 40mg  po this pm, ordered. Monitor bmp.  Appreciate IR and GI input.  Alcohol cirrhosis/ascites:  with h/i gi bleed, per GI note, patient has portal hypertension and varices,  ? history of possible hepatic encephalopathy, not on meds. No prior history of SBP. Currently no confusion. No fever, no abdominal pain. Reported stop drinking 4 months.  H/o HTN:  Now hypotension,  d/c home meds lisinopril and lopressor. Titrate diuretic spironolactone/ lasix as patient tolerate, monitor cr and lytes.  Due to low bp (80/40) midodrine on 9/5 and 9/6 and last dose of midodrine 9/7 am, midodrine was stopped, 9/8 bp now low normal ,  low dose nadolol 10mg  po qd to be started on 9/9, discussed with Dr. Havery Moros.  Hypoxia/crhonic DOE: due to recurrent refractory pleural effusion, difficult to manage, will expect  oxygen dependent until underline  Liver failure addressed.    Code Status: full, over all prognosis is guarded.   Family Communication: patient   Disposition Plan: remain in the hospital, awaiting for further GI/IR recommendation, pleurx? Tips?   Consultants:  GI  IR  Procedures:  US guided thoracentesis 9/4  Antibiotics:  none   Objective: BP 105/59 mmHg  Pulse 75  Temp(Src) 98.8 F (37.1 C) (Oral)  Resp 20  Ht 5\' 3"  (1.6 m)  Wt 152 lb 1.9 oz (69 kg)  BMI 26.95 kg/m2  SpO2 100%  Intake/Output Summary (Last 24 hours) at 07/08/15 1635 Last data filed at 07/08/15 1300  Gross per 24 hour  Intake    582 ml  Output    700 ml  Net   -118 ml   Filed Weights   07/03/15 1837 07/07/15 1717 07/08/15 0300  Weight: 157 lb (71.215 kg) 153 lb 12.8 oz (69.763 kg) 152 lb 1.9 oz (69 kg)    Exam:   General:  NAD, sitting in chair  Cardiovascular: RRR  Respiratory: diminished on the right  Abdomen: Soft/ND/NT, positive BS  Musculoskeletal: No Edema  Neuro: aaox3  Data Reviewed: Basic Metabolic Panel:  Recent Labs Lab 07/04/15 0853 07/05/15 0510 07/06/15 0636 07/07/15 0545 07/08/15 0811  NA 141 139 139 137 137  K 4.4 4.1 4.5 4.6 5.5*  CL 114* 112* 111 109 110  CO2 22 20* 19* 21* 21*  GLUCOSE 95 79 93 93 84  BUN 28* 32* 30* 33* 38*  CREATININE 1.20* 1.51* 1.49* 1.48* 1.50*  CALCIUM 8.3* 7.8* 8.1* 8.0* 8.1*  MG  --   --  1.9 1.8 1.9   Liver  Function Tests:  Recent Labs Lab 07/03/15 1330 07/05/15 0510 07/06/15 0636 07/08/15 0811  AST 65* 53* 70* 67*  ALT 24 20 26 23   ALKPHOS 165* 135* 158* 144*  BILITOT 1.4* 1.1 1.2 1.5*  PROT 5.7* 4.7* 5.4* 5.3*  ALBUMIN 2.3* 1.6* 1.8* 1.7*   No results for input(s): LIPASE, AMYLASE in the last 168 hours. No results for input(s): AMMONIA in the last 168 hours. CBC:  Recent Labs Lab 07/03/15 1330 07/04/15 0853 07/05/15 0510 07/06/15 0636  WBC 11.3* 10.7* 10.2 10.1  NEUTROABS 6.5  --   --   --   HGB  10.8* 9.8* 9.6* 11.3*  HCT 31.9* 31.0* 29.2* 33.0*  MCV 80.4 81.6 81.1 79.7  PLT 200 178 164 127*   Cardiac Enzymes:   No results for input(s): CKTOTAL, CKMB, CKMBINDEX, TROPONINI in the last 168 hours. BNP (last 3 results)  Recent Labs  07/03/15 1330  BNP 241.0*    ProBNP (last 3 results) No results for input(s): PROBNP in the last 8760 hours.  CBG: No results for input(s): GLUCAP in the last 168 hours.  Recent Results (from the past 240 hour(s))  Culture, body fluid-bottle     Status: None (Preliminary result)   Collection Time: 07/04/15 12:37 PM  Result Value Ref Range Status   Specimen Description FLUID RIGHT PLEURAL  Final   Special Requests BOTTLES DRAWN AEROBIC AND ANAEROBIC 10CC  Final   Culture NO GROWTH 4 DAYS  Final   Report Status PENDING  Incomplete  Gram stain     Status: None   Collection Time: 07/04/15 12:37 PM  Result Value Ref Range Status   Specimen Description FLUID RIGHT PLEURAL  Final   Special Requests NONE  Final   Gram Stain   Final    ABUNDANT WBC PRESENT,BOTH PMN AND MONONUCLEAR NO ORGANISMS SEEN GRAM STAIN REVIEWED-AGREE WITH RESULT M VESTAL    Report Status 07/04/2015 FINAL  Final     Studies: No results found.  Scheduled Meds: . enoxaparin (LOVENOX) injection  40 mg Subcutaneous Q24H  . folic acid  1 mg Oral Daily  . furosemide  40 mg Oral Daily  . [START ON 07/09/2015] nadolol  10 mg Oral Daily  . pantoprazole  40 mg Oral Daily  . spironolactone  100 mg Oral Daily  . thiamine  100 mg Oral Daily    Continuous Infusions:    Time spent: 2mins  Tecumseh Yeagley MD, PhD  [facility] Pager [phone]. If 7PM-7AM, please contact night-coverage at [email or web address], password [facility] 07/08/2015, 4:35 PM  LOS: 5 days

## 2015-07-08 NOTE — Consult Note (Signed)
Chief Complaint: Patient was seen in consultation today for possible TIPS Chief Complaint  Patient presents with  . Shortness of Breath   at the request of Dr Erlinda Hong  Referring Physician(s): Dr Florencia Reasons  History of Present Illness: Savannah Jackson is a 59 y.o. female   Pt with known ETOH cirrhosis and hepatic hydrothorax Has had recurrent Rt hydrothorax with known 6 thoracentesis over last 3 months Averaging approx 1.8 liters fluid collected each time Now admitted 07/03/15 with worsening dyspnea Ascites and reaccumulation of hydrothorax CXR 9/7 IMPRESSION: Increase in right-sided pleural effusion with opacification of the right hemi thorax. Pt is using supplemental oxygen (3L); but some better with Lasix treatment Still gets out of breath with exertion---walking to restroom Echo wnl MELD 16 Request has been made for evaluation for possible TIPS consideration  Past Medical History  Diagnosis Date  . Hypertension   . Brain aneurysm   . Rotator cuff syndrome of left shoulder   . DDD (degenerative disc disease), lumbar   . Chronic back pain Diverticultis  . Cirrhosis     alcoholic  . Sleep apnea   . ETOH abuse   . Bilateral leg edema   . Hepatomegaly     scope 2014  . Diverticulosis     scope 2014  . Cancer     breast cancer    Past Surgical History  Procedure Laterality Date  . Cholecystectomy    . Brain surgery    . Breast surgery    . Total knee arthroplasty      right. 2002  . Cataract extraction Bilateral     [facility] 2 or 3 years ago  . Colonoscopy N/A 04/10/2013    Procedure: COLONOSCOPY;  Surgeon: Rogene Houston, MD;  Location: [facility];  Service: Endoscopy;  Laterality: N/A;  1030-rescheduled to 1200 Ann notified pt  . Esophagogastroduodenoscopy N/A 04/22/2015    Procedure: ESOPHAGOGASTRODUODENOSCOPY (EGD);  Surgeon: Rogene Houston, MD;  Location: [facility];  Service: Endoscopy;  Laterality: N/A;    Allergies: Latex and  Vicodin  Medications: Prior to Admission medications   Medication Sig Start Date End Date Taking? Authorizing Provider  folic acid (FOLVITE) 1 MG tablet Take 1 tablet (1 mg total) by mouth daily. 04/29/15  Yes Rosita Fire, MD  lisinopril (PRINIVIL,ZESTRIL) 20 MG tablet Take 1 tablet (20 mg total) by mouth daily. 07/18/12  Yes Rosita Fire, MD  metoprolol (LOPRESSOR) 50 MG tablet Take 50 mg by mouth 2 (two) times daily.   Yes Historical Provider, MD  omeprazole (PRILOSEC) 40 MG capsule Take 40 mg by mouth daily.   Yes Historical Provider, MD  ondansetron (ZOFRAN) 4 MG tablet Take 4 mg by mouth every 8 (eight) hours as needed for nausea or vomiting.   Yes Historical Provider, MD  oxyCODONE (OXY IR/ROXICODONE) 5 MG immediate release tablet Take 1 tablet (5 mg total) by mouth 3 (three) times daily as needed for severe pain. 06/15/15  Yes Rogene Houston, MD  pantoprazole (PROTONIX) 40 MG tablet Take 1 tablet (40 mg total) by mouth daily. 04/29/15  Yes Rosita Fire, MD  Potassium Chloride ER 20 MEQ TBCR Take 20 mEq by mouth daily. 05/13/15  Yes Rogene Houston, MD  thiamine 100 MG tablet Take 1 tablet (100 mg total) by mouth daily. 04/29/15  Yes Rosita Fire, MD     History reviewed. No pertinent family history.  Social History   Social History  . Marital Status: Married  Spouse Name: N/A  . Number of Children: N/A  . Years of Education: N/A   Social History Main Topics  . Smoking status: Never Smoker   . Smokeless tobacco: Never Used  . Alcohol Use: 0.0 oz/week    0 Standard drinks or equivalent per week     Comment: none since 03/2015  . Drug Use: No  . Sexual Activity: Yes    Birth Control/ Protection: None   Other Topics Concern  . 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 However he is fairly capable of taking care of himself at home. This couple have an attentive daughter who keeps an eye on both  of them.    Review of Systems: A 12 point ROS discussed and pertinent positives are indicated in the HPI above.  All other systems are negative.  Review of Systems  Constitutional: Positive for activity change and fatigue. Negative for fever and diaphoresis.  Respiratory: Positive for cough and shortness of breath.   Cardiovascular: Negative for chest pain and leg swelling.  Gastrointestinal: Positive for abdominal pain and abdominal distention. Negative for nausea.  Genitourinary: Negative for difficulty urinating.  Neurological: Positive for weakness.  Psychiatric/Behavioral: Negative for behavioral problems and confusion.    Vital Signs: BP 105/59 mmHg  Pulse 75  Temp(Src) 98.8 F (37.1 C) (Oral)  Resp 20  Ht 5\' 3"  (1.6 m)  Wt 152 lb 1.9 oz (69 kg)  BMI 26.95 kg/m2  SpO2 100%  Physical Exam  Constitutional: She is oriented to person, place, and time.  Cardiovascular: Normal rate, regular rhythm and normal heart sounds.   No murmur heard. Pulmonary/Chest: Effort normal. She has wheezes.  Abdominal: Soft. Bowel sounds are normal. She exhibits distension. There is no tenderness.  Musculoskeletal: Normal range of motion.  Neurological: She is alert and oriented to person, place, and time.  Skin: Skin is warm and dry.  Psychiatric: She has a normal mood and affect. Her behavior is normal. Judgment and thought content normal.  Nursing note and vitals reviewed.   Mallampati Score:     Imaging: Dg Chest 1 View  07/04/2015   CLINICAL DATA:  Status post right thoracentesis.  EXAM: CHEST  1 VIEW  COMPARISON:  07/03/2015  FINDINGS: There is a moderate right pleural effusion which has decreased in size compared with 07/03/2015. There is no right pneumothorax. There is no left pleural effusion. There is no left pneumothorax. There is no focal consolidation. The heart and mediastinal contours are stable.  The osseous structures are unremarkable.  IMPRESSION: Moderate right pleural  effusion without a pneumothorax.   Electronically Signed   By: Kathreen Devoid   On: 07/04/2015 13:32   Dg Chest 1 View  06/16/2015   CLINICAL DATA:  Post thoracentesis  EXAM: CHEST  1 VIEW  COMPARISON:  05/19/2015  FINDINGS: Moderate right pleural effusion again noted with right lower lobe atelectasis. No pneumothorax following right thoracentesis. Left lung is clear. Heart is normal size. No acute bony abnormality.  IMPRESSION: Moderate right pleural effusion. No pneumothorax following thoracentesis.   Electronically Signed   By: Rolm Baptise M.D.   On: 06/16/2015 14:03   Dg Chest 2 View  07/07/2015   CLINICAL DATA:  History right-sided pleural effusion and recent thoracentesis with increasing shortness of Breath  EXAM: CHEST - 2 VIEW  COMPARISON:  07/04/2015  FINDINGS: There is been significant increase in the right-sided pleural  effusion with total opacification of the right hemi thorax. The left lung remains clear. The cardiac shadow is stable. The bony structures are unremarkable.  IMPRESSION: Increase in right-sided pleural effusion with opacification of the right hemi thorax.  These results will be called to the ordering clinician or representative by the Radiologist Assistant, and communication documented in the PACS or zVision Dashboard.   Electronically Signed   By: Inez Catalina M.D.   On: 07/07/2015 12:50   Dg Chest 2 View  07/03/2015   CLINICAL DATA:  Shortness of breath and cough for 1 week. Fever earlier in the week. Recent admission. Patient had fluid drawn from the right lung many times. Weakness.  EXAM: CHEST  2 VIEW  COMPARISON:  06/16/2015  FINDINGS: Moderate right-sided pleural effusion, significantly increased since prior study. Mediastinal shift towards the left. Heart size is probably normal. Minimal left basilar subsegmental atelectasis. Surgical clips are present in the upper abdomen.  IMPRESSION: Reaccumulation of right pleural fluid.  Minimal left lower lobe atelectasis.    Electronically Signed   By: Nolon Nations M.D.   On: 07/03/2015 14:32   US Thoracentesis Asp Pleural Space W/img Guide  07/04/2015   INDICATION: Cirrhosis, dyspnea, prior history of breast cancer, recurrent right pleural effusion/ probable right hepatic hydrothorax ; request is made for diagnostic and therapeutic right thoracentesis.  EXAM: ULTRASOUND GUIDED DIAGNOSTIC AND THERAPEUTIC RIGHT THORACENTESIS  COMPARISON:  Prior thoracentesis on 06/16/2015  MEDICATIONS: None  COMPLICATIONS: None immediate  TECHNIQUE: Informed written consent was obtained from the patient after a discussion of the risks, benefits and alternatives to treatment. A timeout was performed prior to the initiation of the procedure.  Initial ultrasound scanning demonstrates a large right pleural effusion. The lower chest was prepped and draped in the usual sterile fashion. 1% lidocaine was used for local anesthesia.  An ultrasound image was saved for documentation purposes. A 6 Fr Safe-T-Centesis catheter was introduced. The thoracentesis was performed. The catheter was removed and a dressing was applied. The patient tolerated the procedure well without immediate post procedural complication. The patient was escorted to have an upright chest radiograph.  FINDINGS: A total of approximately 2.2 liters of hazy, yellow fluid was removed. Requested samples were sent to the laboratory. Only the above amount of fluid was removed at this time secondary to increased patient coughing.  IMPRESSION: Successful ultrasound-guided diagnostic and therapeutic right sided thoracentesis yielding 2.2 liters of pleural fluid.  Read by: Rowe Robert, PA-C   Electronically Signed   By: Jacqulynn Cadet M.D.   On: 07/04/2015 13:05   US Thoracentesis Asp Pleural Space W/img Guide  06/16/2015   CLINICAL DATA:  Right pleural effusion  EXAM: ULTRASOUND GUIDED RIGHT THORACENTESIS  COMPARISON:  None.  PROCEDURE: An ultrasound guided thoracentesis was thoroughly  discussed with the patient and questions answered. The benefits, risks, alternatives and complications were also discussed. The patient understands and wishes to proceed with the procedure. Written consent was obtained.  Ultrasound was performed to localize and mark an adequate pocket of fluid in the right chest. The area was then prepped and draped in the normal sterile fashion. 1% Lidocaine was used for local anesthesia. Under ultrasound guidance a 19 gauge Yueh catheter was introduced. Thoracentesis was performed. The catheter was removed and a dressing applied.  COMPLICATIONS: None.  FINDINGS: A total of approximately 1.9 L of clear yellow fluid was removed. A fluid sample was notsent for laboratory analysis.  IMPRESSION: Successful ultrasound guided right thoracentesis yielding 1.9 L  of pleural fluid.   Electronically Signed   By: Rolm Baptise M.D.   On: 06/16/2015 14:39    Labs:  CBC:  Recent Labs  07/03/15 1330 07/04/15 0853 07/05/15 0510 07/06/15 0636  WBC 11.3* 10.7* 10.2 10.1  HGB 10.8* 9.8* 9.6* 11.3*  HCT 31.9* 31.0* 29.2* 33.0*  PLT 200 178 164 127*    COAGS:  Recent Labs  04/29/15 0608 05/13/15 1647 07/03/15 1330 07/07/15 1406  INR 1.49 1.36 1.59* 1.54*    BMP:  Recent Labs  07/05/15 0510 07/06/15 0636 07/07/15 0545 07/08/15 0811  NA 139 139 137 137  K 4.1 4.5 4.6 5.5*  CL 112* 111 109 110  CO2 20* 19* 21* 21*  GLUCOSE 79 93 93 84  BUN 32* 30* 33* 38*  CALCIUM 7.8* 8.1* 8.0* 8.1*  CREATININE 1.51* 1.49* 1.48* 1.50*  GFRNONAA 37* 37* 38* 37*  GFRAA 43* 43* 44* 43*    LIVER FUNCTION TESTS:  Recent Labs  07/03/15 1330 07/05/15 0510 07/06/15 0636 07/08/15 0811  BILITOT 1.4* 1.1 1.2 1.5*  AST 65* 53* 70* 67*  ALT 24 20 26 23   ALKPHOS 165* 135* 158* 144*  PROT 5.7* 4.7* 5.4* 5.3*  ALBUMIN 2.3* 1.6* 1.8* 1.7*    TUMOR MARKERS: No results for input(s): AFPTM, CEA, CA199, CHROMGRNA in the last 8760 hours.  Assessment and Plan:  Alcoholic  cirrhosis Recurrent Rt hydrothorax Ascites Dyspnea some better with Lasix Request made for consideration of TIPS MELD 16 Dr Laurence Ferrari aware of request He will see pt later today   Thank you for this interesting consult.  I greatly enjoyed meeting Savannah Jackson and look forward to participating in their care.  A copy of this report was sent to the requesting provider on this date.  Signed: Shateria Paternostro A 07/08/2015, 3:54 PM   I spent a total of 20 Minutes    in face to face in clinical consultation, greater than 50% of which was counseling/coordinating care for possible TIPS

## 2015-07-08 NOTE — Care Management Note (Signed)
Case Management Note  Patient Details  Name: Savannah Jackson MRN: [id number] Date of Birth: January 27, 1956  Subjective/Objective:     Patient sats are still decreased, will need home oxygen, patient chose [facility], referral made to [facility] with [facility].  MD notified for orders for home oxygen.                Action/Plan:   Expected Discharge Date:                  Expected Discharge Plan:  Home/Self Care  In-House Referral:     Discharge planning Services  CM Consult  Post Acute Care Choice:  Durable Medical Equipment Choice offered to:     DME Arranged:  Oxygen DME Agency:  [place]:    [place] Agency:     Status of Service:  In process, will continue to follow  Medicare Important Message Given:  Yes-second notification given Date Medicare IM Given:    Medicare IM give by:    Date Additional Medicare IM Given:    Additional Medicare Important Message give by:     If discussed at [place] of Stay Meetings, dates discussed:    Additional Comments:  Zenon Mayo, RN 07/08/2015, 3:50 PM

## 2015-07-09 DIAGNOSIS — R0602 Shortness of breath: Secondary | ICD-10-CM | POA: Diagnosis present

## 2015-07-09 DIAGNOSIS — R609 Edema, unspecified: Secondary | ICD-10-CM | POA: Diagnosis present

## 2015-07-09 LAB — COMPREHENSIVE METABOLIC PANEL
ALBUMIN: 1.7 g/dL — AB (ref 3.5–5.0)
ALK PHOS: 151 U/L — AB (ref 38–126)
ALT: 25 U/L (ref 14–54)
ANION GAP: 4 — AB (ref 5–15)
AST: 61 U/L — ABNORMAL HIGH (ref 15–41)
BILIRUBIN TOTAL: 1.4 mg/dL — AB (ref 0.3–1.2)
BUN: 33 mg/dL — AB (ref 6–20)
CALCIUM: 8 mg/dL — AB (ref 8.9–10.3)
CO2: 23 mmol/L (ref 22–32)
Chloride: 108 mmol/L (ref 101–111)
Creatinine, Ser: 1.39 mg/dL — ABNORMAL HIGH (ref 0.44–1.00)
GFR calc Af Amer: 47 mL/min — ABNORMAL LOW (ref >60)
GFR, EST NON AFRICAN AMERICAN: 41 mL/min — AB (ref >60)
GLUCOSE: 95 mg/dL (ref 65–99)
Potassium: 4.8 mmol/L (ref 3.5–5.1)
Sodium: 135 mmol/L (ref 135–145)
TOTAL PROTEIN: 5.3 g/dL — AB (ref 6.5–8.1)

## 2015-07-09 LAB — PROTIME-INR
INR: 1.65 — ABNORMAL HIGH (ref 0.00–1.49)
PROTHROMBIN TIME: 19.5 s — AB (ref 11.6–15.2)

## 2015-07-09 LAB — MAGNESIUM: MAGNESIUM: 1.8 mg/dL (ref 1.7–2.4)

## 2015-07-09 LAB — CULTURE, BODY FLUID W GRAM STAIN -BOTTLE: Culture: NO GROWTH

## 2015-07-09 LAB — CULTURE, BODY FLUID-BOTTLE

## 2015-07-09 MED ORDER — TRAMADOL HCL 50 MG PO TABS: 50.0000 mg | ORAL_TABLET | Freq: Once | ORAL | Status: DC

## 2015-07-09 MED ORDER — FUROSEMIDE 40 MG PO TABS
40.0000 mg | ORAL_TABLET | Freq: Two times a day (BID) | ORAL | Status: DC
  Administered 2015-07-09 – 2015-07-11 (×4): 40 mg via ORAL
  Filled 2015-07-09 (×4): qty 1

## 2015-07-09 MED ORDER — TRAMADOL HCL 50 MG PO TABS
50.0000 mg | ORAL_TABLET | Freq: Three times a day (TID) | ORAL | Status: DC | PRN
  Administered 2015-07-09 – 2015-07-17 (×6): 50 mg via ORAL
  Filled 2015-07-09 (×6): qty 1

## 2015-07-09 MED ORDER — SPIRONOLACTONE 25 MG PO TABS
100.0000 mg | ORAL_TABLET | Freq: Two times a day (BID) | ORAL | Status: DC
  Administered 2015-07-09 – 2015-07-11 (×4): 100 mg via ORAL
  Filled 2015-07-09 (×3): qty 4

## 2015-07-09 NOTE — Care Management Important Message (Signed)
Important Message  Patient Details  Name: AUDRIANNA WILMOTT MRN: [id number] Date of Birth: 25-Nov-1955   Medicare Important Message Given:  Yes-third notification given    Nathen May 07/09/2015, 12:03 [place] Message  Patient Details  Name: SHAYLENE WEEMS MRN: [id number] Date of Birth: 04/24/1956   Medicare Important Message Given:  Yes-third notification given    Nathen May 07/09/2015, 12:03 PM

## 2015-07-09 NOTE — Progress Notes (Signed)
Progress Note   Subjective  Patient reports she had a good response to lasix yesterday with increased urine output. She thinks her breathing may be slightly improved today. No chest pain. No events overnight. Afebrile.    Objective   Vital signs in last 24 hours: Temp:  [98.7 F (37.1 C)-98.8 F (37.1 C)] 98.8 F (37.1 C) (09/09 0525) Pulse Rate:  [75-90] 90 (09/09 0549) Resp:  [18-20] 18 (09/09 0525) BP: (104-159)/(58-146) 104/61 mmHg (09/09 0549) SpO2:  [98 %-100 %] 98 % (09/09 0525) Weight:  [151 lb 14.4 oz (68.9 kg)] 151 lb 14.4 oz (68.9 kg) (09/09 0525) Last BM Date: 07/08/15 General:    African american female in NAD Heart:  Regular rate and rhythm; no murmurs Lungs: decreased breath sounds throughtout on right, clear on left, no wheezing Abdomen:  Soft, nontender . Normal bowel sounds. Extremities:  trace edema. Neurologic:  Alert and oriented,  grossly normal neurologically. No asterixis Psych:  Cooperative. Normal mood and affect.  Intake/Output from previous day: 09/08 0701 - 09/09 0700 In: 360 [P.O.:360] Out: 850 [Urine:850] Intake/Output this shift:    Lab Results: No results for input(s): WBC, HGB, HCT, PLT in the last 72 hours. BMET  Recent Labs  07/08/15 0811 07/08/15 1806 07/09/15 0710  NA 137 137 135  K 5.5* 4.7 4.8  CL 110 108 108  CO2 21* 23 23  GLUCOSE 84 107* 95  BUN 38* 36* 33*  CREATININE 1.50* 1.52* 1.39*  CALCIUM 8.1* 8.2* 8.0*   LFT  Recent Labs  07/09/15 0710  PROT 5.3*  ALBUMIN 1.7*  AST 61*  ALT 25  ALKPHOS 151*  BILITOT 1.4*   PT/INR  Recent Labs  07/07/15 1406 07/09/15 0710  LABPROT 18.5* 19.5*  INR 1.54* 1.65*    Studies/Results: Dg Chest 2 View  07/07/2015   CLINICAL DATA:  History right-sided pleural effusion and recent thoracentesis with increasing shortness of Breath  EXAM: CHEST - 2 VIEW  COMPARISON:  07/04/2015  FINDINGS: There is been significant increase in the right-sided pleural effusion with  total opacification of the right hemi thorax. The left lung remains clear. The cardiac shadow is stable. The bony structures are unremarkable.  IMPRESSION: Increase in right-sided pleural effusion with opacification of the right hemi thorax.  These results will be called to the ordering clinician or representative by the Radiologist Assistant, and communication documented in the PACS or zVision Dashboard.   Electronically Signed   By: Inez Catalina M.D.   On: 07/07/2015 12:50       Assessment / Plan:   59 y/o female with alcohol cirrhosis complicated by ascites and hepatic hydrothorax, admitted for worsening dyspnea / hydrothorax. No evidence of infection from pleural fluid to date. Patient with CKD and coagulopathy, MELD of 16 on admission.  We have been monitoring renal function closely as we have been titrating up diuretics. Cr looks improved today.  Overall, first line therapy for hepatic hydrothorax recommend optimization of diuretics as her renal function allows. At this time would continue lasix 40mg  BID and increase aldactone to 200mg , would repeat BMP later today to ensure stable K. Echocardiogram without significant abnormality. Appreciate IR evaluation regarding TIPS. We will see how she does over the next few days with titration of diuretics, however if she does not respond to high dose diuretics or can't tolerate diuretics will consider TIPS next week. Hepatic hydrothorax tends to re-accumulate quickly following thoracentesis and would hope to manage this without  repeated thoracentesis, although it depends on how aggressive we can be with her diuretics and her oxygen requirement. Otherwise continue strict < 2gm sodium restricted diet. MELD is 16, she should have a hepatology transplant evaluation as an outpatient. Continue nadolol as tolerated for esophageal varices if blood pressure allows. If patient has fever or abdominal pain moving forward would have low threshold for paracentesis / repeat  thoracentesis given PMN count. Please call with questions / concerns.   Active Problems:   Pleural effusion associated with hepatic disorder   Acute respiratory distress   Alcoholic cirrhosis of liver with ascites   Recurrent right pleural effusion   Acute respiratory failure with hypoxia   LOS: 6 days   Renelda Loma Claude Waldman  07/09/2015, 8:17 AM  [place] Cellar, MD [facility] Pager [phone]

## 2015-07-09 NOTE — Progress Notes (Signed)
PROGRESS NOTE  Savannah Jackson [id number] DOB: November 24, 1955 DOA: 07/03/2015 PCP: Savannah Fire, MD  HPI/Recap of past 24 hours:  Reported make more urine while on diuretics, mild intermittent dry cough noticed during encounter. Remain DOE.  C/o headache. Denies ab pain, no fever.  no n/v. bp low normal, denies dizziness.   Assessment/Plan: Active Problems:   Pleural effusion associated with hepatic disorder   Acute respiratory distress   Alcoholic cirrhosis of liver with ascites   Recurrent right pleural effusion   Acute respiratory failure with hypoxia  Recurrent hepatic hydrothorax:  S/p recurrent thoracentesis q2wks since 03/2015, S/p thoracentesis in 9/4 again  , appreciate IR input. Culture no growth. Gram stain no organism seen. Lasix/spironolectone/oxygen supplement.  Echo grade 1 diastolic dysfunction, otherwise unremarkable.  GI does not recommend repeat thoracentesis, like to monitor effect of diuretics for a few days in the hospital, if patient does not tolerate then will proceed with TIPS.   Appreciate IR and GI input.  Alcohol cirrhosis/ascites:  with h/i gi bleed, per GI note, patient has portal hypertension and varices,  ? history of possible hepatic encephalopathy, not on meds. No prior history of SBP. Currently no confusion. No fever, no abdominal pain. Reported stop drinking 4 months.  H/o HTN:  Now hypotension,  d/c home meds lisinopril and lopressor. Titrate diuretic spironolactone/ lasix   Due to low bp (80/40) midodrine was started on 9/5, last dose of midodrine 9/7 am, midodrine was stopped, 9/8 bp now low normal   low dose nadolol 10mg  po qd started on 9/9, discussed with Dr. Havery Jackson.  Hypoxia/chronic DOE/chronic respiratory insufficiency due to recurrent pleural effusion: due to recurrent refractory pleural effusion, difficult to manage, will expect oxygen dependent until underline  Liver failure addressed. Patient will need home  oxygen.  Headache: likely from oxygen supplement, will add humidifier. Prn pain meds.  Lower extremity edema: likely from cirrhosis, but will check venous US to r/o DVT.   Code Status: full, over all prognosis is guarded.   Family Communication: patient   Disposition Plan: remain in the hospital, possible discharge early next week if no plan for TIPS, IR/GI following   Consultants:  GI  IR  Procedures:  US guided thoracentesis 9/4  Antibiotics:  none   Objective: BP 108/70 mmHg  Pulse 51  Temp(Src) 98.3 F (36.8 C) (Oral)  Resp 18  Ht 5\' 3"  (1.6 m)  Wt 151 lb 14.4 oz (68.9 kg)  BMI 26.91 kg/m2  SpO2 83%  Intake/Output Summary (Last 24 hours) at 07/09/15 1829 Last data filed at 07/09/15 1357  Gross per 24 hour  Intake    360 ml  Output   1200 ml  Net   -840 ml   Filed Weights   07/07/15 1717 07/08/15 0300 07/09/15 0525  Weight: 153 lb 12.8 oz (69.763 kg) 152 lb 1.9 oz (69 kg) 151 lb 14.4 oz (68.9 kg)    Exam:   General:  NAD, sitting in bed  Cardiovascular: RRR  Respiratory: diminished on the right  Abdomen: Soft/ND/NT, positive BS  Musculoskeletal: 1-2+ pitting Edema, right >left  Neuro: aaox3  Data Reviewed: Basic Metabolic Panel:  Recent Labs Lab 07/06/15 0636 07/07/15 0545 07/08/15 0811 07/08/15 1806 07/09/15 0710  NA 139 137 137 137 135  K 4.5 4.6 5.5* 4.7 4.8  CL 111 109 110 108 108  CO2 19* 21* 21* 23 23  GLUCOSE 93 93 84 107* 95  BUN 30* 33* 38* 36* 33*  CREATININE 1.49* 1.48*  1.50* 1.52* 1.39*  CALCIUM 8.1* 8.0* 8.1* 8.2* 8.0*  MG 1.9 1.8 1.9  --  1.8   Liver Function Tests:  Recent Labs Lab 07/03/15 1330 07/05/15 0510 07/06/15 0636 07/08/15 0811 07/09/15 0710  AST 65* 53* 70* 67* 61*  ALT 24 20 26 23 25   ALKPHOS 165* 135* 158* 144* 151*  BILITOT 1.4* 1.1 1.2 1.5* 1.4*  PROT 5.7* 4.7* 5.4* 5.3* 5.3*  ALBUMIN 2.3* 1.6* 1.8* 1.7* 1.7*   No results for input(s): LIPASE, AMYLASE in the last 168 hours. No  results for input(s): AMMONIA in the last 168 hours. CBC:  Recent Labs Lab 07/03/15 1330 07/04/15 0853 07/05/15 0510 07/06/15 0636  WBC 11.3* 10.7* 10.2 10.1  NEUTROABS 6.5  --   --   --   HGB 10.8* 9.8* 9.6* 11.3*  HCT 31.9* 31.0* 29.2* 33.0*  MCV 80.4 81.6 81.1 79.7  PLT 200 178 164 127*   Cardiac Enzymes:   No results for input(s): CKTOTAL, CKMB, CKMBINDEX, TROPONINI in the last 168 hours. BNP (last 3 results)  Recent Labs  07/03/15 1330  BNP 241.0*    ProBNP (last 3 results) No results for input(s): PROBNP in the last 8760 hours.  CBG: No results for input(s): GLUCAP in the last 168 hours.  Recent Results (from the past 240 hour(s))  Culture, body fluid-bottle     Status: None   Collection Time: 07/04/15 12:37 PM  Result Value Ref Range Status   Specimen Description FLUID RIGHT PLEURAL  Final   Special Requests BOTTLES DRAWN AEROBIC AND ANAEROBIC 10CC  Final   Culture NO GROWTH 5 DAYS  Final   Report Status 07/09/2015 FINAL  Final  Gram stain     Status: None   Collection Time: 07/04/15 12:37 PM  Result Value Ref Range Status   Specimen Description FLUID RIGHT PLEURAL  Final   Special Requests NONE  Final   Gram Stain   Final    ABUNDANT WBC PRESENT,BOTH PMN AND MONONUCLEAR NO ORGANISMS SEEN GRAM STAIN REVIEWED-AGREE WITH RESULT M VESTAL    Report Status 07/04/2015 FINAL  Final     Studies: No results found.  Scheduled Meds: . enoxaparin (LOVENOX) injection  40 mg Subcutaneous Q24H  . folic acid  1 mg Oral Daily  . furosemide  40 mg Oral BID  . nadolol  10 mg Oral Daily  . pantoprazole  40 mg Oral Daily  . spironolactone  100 mg Oral BID  . thiamine  100 mg Oral Daily    Continuous Infusions:    Time spent: 77mins  Savannah Cella MD, PhD  [facility] Pager [phone]. If 7PM-7AM, please contact night-coverage at [email or web address], password [email or web address] 07/09/2015, 6:29 PM  LOS: 6 days

## 2015-07-10 ENCOUNTER — Inpatient Hospital Stay (HOSPITAL_COMMUNITY): Payer: Commercial Managed Care - HMO

## 2015-07-10 DIAGNOSIS — R609 Edema, unspecified: Secondary | ICD-10-CM

## 2015-07-10 DIAGNOSIS — J80 Acute respiratory distress syndrome: Secondary | ICD-10-CM

## 2015-07-10 DIAGNOSIS — J9601 Acute respiratory failure with hypoxia: Secondary | ICD-10-CM

## 2015-07-10 LAB — BASIC METABOLIC PANEL
Anion gap: 5 (ref 5–15)
BUN: 32 mg/dL — ABNORMAL HIGH (ref 6–20)
CHLORIDE: 107 mmol/L (ref 101–111)
CO2: 25 mmol/L (ref 22–32)
CREATININE: 1.32 mg/dL — AB (ref 0.44–1.00)
Calcium: 8.3 mg/dL — ABNORMAL LOW (ref 8.9–10.3)
GFR calc non Af Amer: 43 mL/min — ABNORMAL LOW (ref >60)
GFR, EST AFRICAN AMERICAN: 50 mL/min — AB (ref >60)
GLUCOSE: 102 mg/dL — AB (ref 65–99)
Potassium: 5.3 mmol/L — ABNORMAL HIGH (ref 3.5–5.1)
Sodium: 137 mmol/L (ref 135–145)

## 2015-07-10 LAB — MAGNESIUM: Magnesium: 1.8 mg/dL (ref 1.7–2.4)

## 2015-07-10 NOTE — Progress Notes (Signed)
VASCULAR LAB PRELIMINARY  PRELIMINARY  PRELIMINARY  PRELIMINARY  Bilateral lower extremity venous duplex completed.    Preliminary report:  There is no DVT or SVT noted in the bilateral lower extremities.   Vaudine Dutan, RVT 07/10/2015, 11:26 AM

## 2015-07-10 NOTE — Progress Notes (Signed)
[facility]                                                                              Patient Demographics  Savannah Jackson, is a 59 y.o. female, DOB - September 25, 1956, UA:[id number]  Admit date - 07/03/2015   Admitting Physician Florencia Reasons, MD  Outpatient Primary MD for the patient is Rosita Fire, MD  LOS - 7   Chief Complaint  Patient presents with  . Shortness of Breath       Brief HPI   Per Dr. Nehemiah Settle on 9/3 Savannah Jackson is a 59 y.o. female with a history of alcoholic cirrhosis, hepatomegaly, diverticulosis, right pleural effusion status post for centesis every month 3 over the past 3 months. The patient presented with increasing shortness of breath which began 2-3 days after her last thoracentesis. Her symptoms worsened over the past few weeks before admission and worse when ambulating and improved at rest. In the emergency department, nasal cannula was applied which provided oxygen saturation mid 90s, however this worsens when ambulating and her oxygen level drops to high 80s. The patient was admitted for further workup.  Assessment & Plan    Principal Problem: Recurrent hepatic hydrothorax:  Patient has been having to weekly thoracentesis since June 2016. Interventional radiology was consulted, patient underwent thoracentesis on 9/4, cultures so far negative. Patient was placed on Lasix and spironolactone, O2 Echo grade 1 diastolic dysfunction, otherwise unremarkable. Gastroenterology was also consulted and did not recommend repeat thoracentesis, recommended to monitor with diuresis, if patient is not able to tolerate then proceed with  TIPS  Active problems Alcohol cirrhosis/ascites: With history of portal hypertension and varices, prior GI bleed - No prior history of SBP. Currently stable, no encephalopathy. - Reported stop drinking 4 months.  Hypotension with history of hypertension  Home medications including lisinopril and Lopressor were  discontinued  Continue spironolactone and Lasix.  Nadolol was started on 9/9 per GI recommendations as tolerated with BP.  Hypoxia/chronic DOE/chronic respiratory insufficiency due to recurrent pleural effusion: due to recurrent refractory pleural effusion - difficult to manage, will expect oxygen dependent until underline liver failure addressed. Patient will need home oxygen.  Lower extremity edema: likely from cirrhosis - Doppler ultrasound negative for DVT  Code status: Full  Family Communication: Discussed in detail with the patient, all imaging results, lab results explained to the patient    Disposition Plan: Not medically ready.  Time Spent in minutes  25 minutes  Procedures  thoracentesis  Consults   Dementia cardiology, gastroenterology  DVT Prophylaxis  Lovenox   Medications  Scheduled Meds: . enoxaparin (LOVENOX) injection  40 mg Subcutaneous Q24H  . folic acid  1 mg Oral Daily  . furosemide  40 mg Oral BID  . nadolol  10 mg Oral Daily  . pantoprazole  40 mg Oral Daily  . spironolactone  100 mg Oral BID  . thiamine  100 mg Oral Daily   Continuous Infusions:  PRN Meds:.alum & mag hydroxide-simeth, benzonatate, ondansetron, oxyCODONE, traMADol   Antibiotics   Anti-infectives    None        Subjective:   Savannah Jackson was  seen and examined today. Patient denies dizziness, chest pain, shortness of breath, abdominal pain, N/V/D/C, new weakness, numbess, tingling. No acute events overnight.    Objective:   Blood pressure 97/62, pulse 55, temperature 98.2 F (36.8 C), temperature source Oral, resp. rate 24, height 5\' 3"  (1.6 m), weight 65.545 kg (144 lb 8 oz), SpO2 100 %.  Wt Readings from Last 3 Encounters:  07/10/15 65.545 kg (144 lb 8 oz)  06/15/15 71.578 kg (157 lb 12.8 oz)  05/13/15 78.744 kg (173 lb 9.6 oz)     Intake/Output Summary (Last 24 hours) at 07/10/15 1203 Last data filed at 07/10/15 1030  Gross per 24 hour  Intake    582 ml    Output    800 ml  Net   -218 ml    Exam  General: Alert and oriented x 3, NAD  HEENT:  PERRLA, EOMI, Anicteric Sclera, mucous membranes moist.   Neck: Supple, no JVD, no masses  CVS: S1 S2 auscultated, no rubs, murmurs or gallops. Regular rate and rhythm.  Respiratory: Diminished on the right  Abdomen: Soft, nontender, nondistended, + bowel sounds  Ext: no cyanosis clubbing, 1-2+ edema  Neuro: AAOx3, Cr N's II- XII. Strength 5/5 upper and lower extremities bilaterally  Skin: No rashes  Psych: Normal affect and demeanor, alert and oriented x3    Data Review   Micro Results Recent Results (from the past 240 hour(s))  Culture, body fluid-bottle     Status: None   Collection Time: 07/04/15 12:37 PM  Result Value Ref Range Status   Specimen Description FLUID RIGHT PLEURAL  Final   Special Requests BOTTLES DRAWN AEROBIC AND ANAEROBIC 10CC  Final   Culture NO GROWTH 5 DAYS  Final   Report Status 07/09/2015 FINAL  Final  Gram stain     Status: None   Collection Time: 07/04/15 12:37 PM  Result Value Ref Range Status   Specimen Description FLUID RIGHT PLEURAL  Final   Special Requests NONE  Final   Gram Stain   Final    ABUNDANT WBC PRESENT,BOTH PMN AND MONONUCLEAR NO ORGANISMS SEEN GRAM STAIN REVIEWED-AGREE WITH RESULT M VESTAL    Report Status 07/04/2015 FINAL  Final    Radiology Reports Dg Chest 1 View  07/04/2015   CLINICAL DATA:  Status post right thoracentesis.  EXAM: CHEST  1 VIEW  COMPARISON:  07/03/2015  FINDINGS: There is a moderate right pleural effusion which has decreased in size compared with 07/03/2015. There is no right pneumothorax. There is no left pleural effusion. There is no left pneumothorax. There is no focal consolidation. The heart and mediastinal contours are stable.  The osseous structures are unremarkable.  IMPRESSION: Moderate right pleural effusion without a pneumothorax.   Electronically Signed   By: Kathreen Devoid   On: 07/04/2015 13:32   Dg  Chest 1 View  06/16/2015   CLINICAL DATA:  Post thoracentesis  EXAM: CHEST  1 VIEW  COMPARISON:  05/19/2015  FINDINGS: Moderate right pleural effusion again noted with right lower lobe atelectasis. No pneumothorax following right thoracentesis. Left lung is clear. Heart is normal size. No acute bony abnormality.  IMPRESSION: Moderate right pleural effusion. No pneumothorax following thoracentesis.   Electronically Signed   By: Rolm Baptise M.D.   On: 06/16/2015 14:03   Dg Chest 2 View  07/07/2015   CLINICAL DATA:  History right-sided pleural effusion and recent thoracentesis with increasing shortness of Breath  EXAM: CHEST - 2 VIEW  COMPARISON:  07/04/2015  FINDINGS: There is been significant increase in the right-sided pleural effusion with total opacification of the right hemi thorax. The left lung remains clear. The cardiac shadow is stable. The bony structures are unremarkable.  IMPRESSION: Increase in right-sided pleural effusion with opacification of the right hemi thorax.  These results will be called to the ordering clinician or representative by the Radiologist Assistant, and communication documented in the PACS or zVision Dashboard.   Electronically Signed   By: Inez Catalina M.D.   On: 07/07/2015 12:50   Dg Chest 2 View  07/03/2015   CLINICAL DATA:  Shortness of breath and cough for 1 week. Fever earlier in the week. Recent admission. Patient had fluid drawn from the right lung many times. Weakness.  EXAM: CHEST  2 VIEW  COMPARISON:  06/16/2015  FINDINGS: Moderate right-sided pleural effusion, significantly increased since prior study. Mediastinal shift towards the left. Heart size is probably normal. Minimal left basilar subsegmental atelectasis. Surgical clips are present in the upper abdomen.  IMPRESSION: Reaccumulation of right pleural fluid.  Minimal left lower lobe atelectasis.   Electronically Signed   By: Nolon Nations M.D.   On: 07/03/2015 14:32   US Thoracentesis Asp Pleural Space W/img  Guide  07/04/2015   INDICATION: Cirrhosis, dyspnea, prior history of breast cancer, recurrent right pleural effusion/ probable right hepatic hydrothorax ; request is made for diagnostic and therapeutic right thoracentesis.  EXAM: ULTRASOUND GUIDED DIAGNOSTIC AND THERAPEUTIC RIGHT THORACENTESIS  COMPARISON:  Prior thoracentesis on 06/16/2015  MEDICATIONS: None  COMPLICATIONS: None immediate  TECHNIQUE: Informed written consent was obtained from the patient after a discussion of the risks, benefits and alternatives to treatment. A timeout was performed prior to the initiation of the procedure.  Initial ultrasound scanning demonstrates a large right pleural effusion. The lower chest was prepped and draped in the usual sterile fashion. 1% lidocaine was used for local anesthesia.  An ultrasound image was saved for documentation purposes. A 6 Fr Safe-T-Centesis catheter was introduced. The thoracentesis was performed. The catheter was removed and a dressing was applied. The patient tolerated the procedure well without immediate post procedural complication. The patient was escorted to have an upright chest radiograph.  FINDINGS: A total of approximately 2.2 liters of hazy, yellow fluid was removed. Requested samples were sent to the laboratory. Only the above amount of fluid was removed at this time secondary to increased patient coughing.  IMPRESSION: Successful ultrasound-guided diagnostic and therapeutic right sided thoracentesis yielding 2.2 liters of pleural fluid.  Read by: Rowe Robert, PA-C   Electronically Signed   By: Jacqulynn Cadet M.D.   On: 07/04/2015 13:05   US Thoracentesis Asp Pleural Space W/img Guide  06/16/2015   CLINICAL DATA:  Right pleural effusion  EXAM: ULTRASOUND GUIDED RIGHT THORACENTESIS  COMPARISON:  None.  PROCEDURE: An ultrasound guided thoracentesis was thoroughly discussed with the patient and questions answered. The benefits, risks, alternatives and complications were also discussed.  The patient understands and wishes to proceed with the procedure. Written consent was obtained.  Ultrasound was performed to localize and mark an adequate pocket of fluid in the right chest. The area was then prepped and draped in the normal sterile fashion. 1% Lidocaine was used for local anesthesia. Under ultrasound guidance a 19 gauge Yueh catheter was introduced. Thoracentesis was performed. The catheter was removed and a dressing applied.  COMPLICATIONS: None.  FINDINGS: A total of approximately 1.9 L of clear yellow fluid was removed. A fluid sample was notsent for  laboratory analysis.  IMPRESSION: Successful ultrasound guided right thoracentesis yielding 1.9 L of pleural fluid.   Electronically Signed   By: Rolm Baptise M.D.   On: 06/16/2015 14:39    CBC  Recent Labs Lab 07/03/15 1330 07/04/15 0853 07/05/15 0510 07/06/15 0636  WBC 11.3* 10.7* 10.2 10.1  HGB 10.8* 9.8* 9.6* 11.3*  HCT 31.9* 31.0* 29.2* 33.0*  PLT 200 178 164 127*  MCV 80.4 81.6 81.1 79.7  MCH 27.2 25.8* 26.7 27.3  MCHC 33.9 31.6 32.9 34.2  RDW 14.8 15.4 15.3 15.3  LYMPHSABS 2.3  --   --   --   MONOABS 2.0*  --   --   --   EOSABS 0.3  --   --   --   BASOSABS 0.2*  --   --   --     Chemistries   Recent Labs Lab 07/03/15 1330  07/05/15 0510 07/06/15 0636 07/07/15 0545 07/08/15 0811 07/08/15 1806 07/09/15 0710 07/10/15 0441  NA 138  < > 139 139 137 137 137 135 137  K 3.6  < > 4.1 4.5 4.6 5.5* 4.7 4.8 5.3*  CL 110  < > 112* 111 109 110 108 108 107  CO2 21*  < > 20* 19* 21* 21* 23 23 25   GLUCOSE 104*  < > 79 93 93 84 107* 95 102*  BUN 33*  < > 32* 30* 33* 38* 36* 33* 32*  CREATININE 1.44*  < > 1.51* 1.49* 1.48* 1.50* 1.52* 1.39* 1.32*  CALCIUM 7.8*  < > 7.8* 8.1* 8.0* 8.1* 8.2* 8.0* 8.3*  MG  --   --   --  1.9 1.8 1.9  --  1.8 1.8  AST 65*  --  53* 70*  --  67*  --  61*  --   ALT 24  --  20 26  --  23  --  25  --   ALKPHOS 165*  --  135* 158*  --  144*  --  151*  --   BILITOT 1.4*  --  1.1 1.2  --   1.5*  --  1.4*  --   < > = values in this interval not displayed. ------------------------------------------------------------------------------------------------------------------ estimated creatinine clearance is 41.7 mL/min (by C-G formula based on Cr of 1.32). ------------------------------------------------------------------------------------------------------------------ No results for input(s): HGBA1C in the last 72 hours. ------------------------------------------------------------------------------------------------------------------ No results for input(s): CHOL, HDL, LDLCALC, TRIG, CHOLHDL, LDLDIRECT in the last 72 hours. ------------------------------------------------------------------------------------------------------------------  Recent Labs  07/08/15 0811  TSH 2.075   ------------------------------------------------------------------------------------------------------------------ No results for input(s): VITAMINB12, FOLATE, FERRITIN, TIBC, IRON, RETICCTPCT in the last 72 hours.  Coagulation profile  Recent Labs Lab 07/03/15 1330 07/07/15 1406 07/09/15 0710  INR 1.59* 1.54* 1.65*    No results for input(s): DDIMER in the last 72 hours.  Cardiac Enzymes No results for input(s): CKMB, TROPONINI, MYOGLOBIN in the last 168 hours.  Invalid input(s): CK ------------------------------------------------------------------------------------------------------------------ Invalid input(s): POCBNP  No results for input(s): GLUCAP in the last 72 hours.   Khiree Bukhari M.D. [facility] 07/10/2015, 12:03 PM  Pager: [phone] Between 7am to 7pm - call Pager - [phone]  After 7pm go to [email or web address] - password [email or web address]  Call night coverage person covering after 7pm

## 2015-07-11 MED ORDER — FUROSEMIDE 40 MG PO TABS
60.0000 mg | ORAL_TABLET | Freq: Two times a day (BID) | ORAL | Status: DC
  Administered 2015-07-11 – 2015-07-16 (×10): 60 mg via ORAL
  Filled 2015-07-11 (×20): qty 1

## 2015-07-11 MED ORDER — SPIRONOLACTONE 25 MG PO TABS
150.0000 mg | ORAL_TABLET | Freq: Two times a day (BID) | ORAL | Status: DC
  Administered 2015-07-11 – 2015-07-12 (×2): 150 mg via ORAL
  Filled 2015-07-11: qty 6

## 2015-07-11 NOTE — Progress Notes (Signed)
[facility] Progress Note    Since last GI note: She desats to low 80s with sitting up and talking for more than 5-10 seconds.    Objective: Vital signs in last 24 hours: Temp:  [97.9 F (36.6 C)-98.6 F (37 C)] 97.9 F (36.6 C) (09/11 0524) Pulse Rate:  [54-67] 67 (09/11 0524) Resp:  [18-20] 18 (09/11 0524) BP: (102-114)/(45-66) 102/45 mmHg (09/11 0524) SpO2:  [90 %-100 %] 96 % (09/11 0524) Weight:  [146 lb 2.6 oz (66.3 kg)] 146 lb 2.6 oz (66.3 kg) (09/11 0500) Last BM Date: 07/10/15 General: alert and oriented times 3 Heart: regular rate and rythm Abdomen: soft, non-tender, non-distended, normal bowel sounds I/os are not being reliably documented, however her weight is down 11 pounds since admit.   Recent Labs  07/08/15 1806 07/09/15 0710 07/10/15 0441  NA 137 135 137  K 4.7 4.8 5.3*  CL 108 108 107  CO2 23 23 25   GLUCOSE 107* 95 102*  BUN 36* 33* 32*  CREATININE 1.52* 1.39* 1.32*  CALCIUM 8.2* 8.0* 8.3*    Recent Labs  07/09/15 0710  PROT 5.3*  ALBUMIN 1.7*  AST 61*  ALT 25  ALKPHOS 151*  BILITOT 1.4*    Recent Labs  07/09/15 0710  INR 1.65*    Medications: Scheduled Meds: . enoxaparin (LOVENOX) injection  40 mg Subcutaneous Q24H  . folic acid  1 mg Oral Daily  . furosemide  40 mg Oral BID  . nadolol  10 mg Oral Daily  . pantoprazole  40 mg Oral Daily  . spironolactone  100 mg Oral BID  . thiamine  100 mg Oral Daily   Continuous Infusions:  PRN Meds:.alum & mag hydroxide-simeth, benzonatate, ondansetron, oxyCODONE, traMADol    Assessment/Plan: 59 y.o. female with hepatic hydrothorax  She's been on 200 aldactone, 80 lasix now for 2 -3 days. Weights are down but I/Os not documented very well.  She is still hypoxic with just sitting up and talking.  Will increase the diuretics a bit more today (300, 120) but if no significant improvement in next 1-2 days then I think we should proceed with TIPS.    Milus Banister, MD   07/11/2015, 12:59 PM [facility] Pager [phone]

## 2015-07-11 NOTE — Progress Notes (Signed)
[facility]                                                                              Patient Demographics  Savannah Jackson, is a 59 y.o. female, DOB - 07/10/1956, DI:[id number]  Admit date - 07/03/2015   Admitting Physician Florencia Reasons, MD  Outpatient Primary MD for the patient is Rosita Fire, MD  LOS - 8   Chief Complaint  Patient presents with  . Shortness of Breath       Brief HPI   Per Dr. Nehemiah Settle on 9/3 Savannah Jackson is a 59 y.o. female with a history of alcoholic cirrhosis, hepatomegaly, diverticulosis, right pleural effusion status post for centesis every month 3 over the past 3 months. The patient presented with increasing shortness of breath which began 2-3 days after her last thoracentesis. Her symptoms worsened over the past few weeks before admission and worse when ambulating and improved at rest. In the emergency department, nasal cannula was applied which provided oxygen saturation mid 90s, however this worsens when ambulating and her oxygen level drops to high 80s. The patient was admitted for further workup.  Assessment & Plan    Principal Problem: Recurrent hepatic hydrothorax:  - Patient has been having weekly thoracentesis since June 2016. Interventional radiology was consulted, patient underwent thoracentesis on 9/4, cultures so far negative. - Patient was placed on Lasix and spironolactone, O2 - Echo showed grade 1 diastolic dysfunction, otherwise unremarkable. - Gastroenterology was also consulted and did not recommend repeat thoracentesis, recommended to monitor with diuresis, if patient is not able to tolerate then proceed with  TIPS coming week  Active problems Alcohol cirrhosis/ascites: With history of portal hypertension and varices, prior GI bleed - No prior history of SBP. Currently stable, no encephalopathy. - Reported stop drinking 4 months.  Hypotension with history of hypertension -BP currently stable  Home medications  including lisinopril and Lopressor were discontinued  Continue spironolactone and Lasix.  Nadolol was started on 9/9 per GI recommendations as tolerated with BP.  Hypoxia/chronic DOE/chronic respiratory insufficiency due to recurrent pleural effusion: due to recurrent refractory pleural effusion - Patient being successfully weaned on O2, O2 sats 96% on 1 L  Lower extremity edema: likely from cirrhosis - Doppler ultrasound negative for DVT  Code status: Full  Family Communication: Discussed in detail with the patient, all imaging results, lab results explained to the patient    Disposition Plan: Not medically ready.  Time Spent in minutes  25 minutes  Procedures  thoracentesis  Consults   Dementia cardiology, gastroenterology  DVT Prophylaxis  Lovenox   Medications  Scheduled Meds: . enoxaparin (LOVENOX) injection  40 mg Subcutaneous Q24H  . folic acid  1 mg Oral Daily  . furosemide  40 mg Oral BID  . nadolol  10 mg Oral Daily  . pantoprazole  40 mg Oral Daily  . spironolactone  100 mg Oral BID  . thiamine  100 mg Oral Daily   Continuous Infusions:  PRN Meds:.alum & mag hydroxide-simeth, benzonatate, ondansetron, oxyCODONE, traMADol   Antibiotics   Anti-infectives    None        Subjective:  Savannah Jackson was seen and examined today.  denies any pain. No acute events overnight, afebrile. Patient denies dizziness, chest pain, shortness of breath, abdominal pain, N/V/D/C, new weakness, numbess, tingling.   Objective:   Blood pressure 102/45, pulse 67, temperature 97.9 F (36.6 C), temperature source Oral, resp. rate 18, height 5\' 3"  (1.6 m), weight 66.3 kg (146 lb 2.6 oz), SpO2 96 %.  Wt Readings from Last 3 Encounters:  07/11/15 66.3 kg (146 lb 2.6 oz)  06/15/15 71.578 kg (157 lb 12.8 oz)  05/13/15 78.744 kg (173 lb 9.6 oz)     Intake/Output Summary (Last 24 hours) at 07/11/15 1124 Last data filed at 07/11/15 [id number]  Gross per 24 hour  Intake    1179 ml  Output    650 ml  Net    529 ml    Exam  General: Alert and oriented x 3, NAD  HEENT:  PERRLA, EOMI, Anicteric Sclera  Neck: Supple, no JVD, no masses  CVS: S1 S2 clear, RRR  Respiratory: Diminished on the right  Abdomen: Soft, nontender, nondistended, + bowel sounds  Ext: no cyanosis clubbing, 1-2+ edema  Neuro: no new deficits   Skin: No rashes  Psych: Normal affect and demeanor, alert and oriented x3    Data Review   Micro Results Recent Results (from the past 240 hour(s))  Culture, body fluid-bottle     Status: None   Collection Time: 07/04/15 12:37 PM  Result Value Ref Range Status   Specimen Description FLUID RIGHT PLEURAL  Final   Special Requests BOTTLES DRAWN AEROBIC AND ANAEROBIC 10CC  Final   Culture NO GROWTH 5 DAYS  Final   Report Status 07/09/2015 FINAL  Final  Gram stain     Status: None   Collection Time: 07/04/15 12:37 PM  Result Value Ref Range Status   Specimen Description FLUID RIGHT PLEURAL  Final   Special Requests NONE  Final   Gram Stain   Final    ABUNDANT WBC PRESENT,BOTH PMN AND MONONUCLEAR NO ORGANISMS SEEN GRAM STAIN REVIEWED-AGREE WITH RESULT M VESTAL    Report Status 07/04/2015 FINAL  Final    Radiology Reports Dg Chest 1 View  07/04/2015   CLINICAL DATA:  Status post right thoracentesis.  EXAM: CHEST  1 VIEW  COMPARISON:  07/03/2015  FINDINGS: There is a moderate right pleural effusion which has decreased in size compared with 07/03/2015. There is no right pneumothorax. There is no left pleural effusion. There is no left pneumothorax. There is no focal consolidation. The heart and mediastinal contours are stable.  The osseous structures are unremarkable.  IMPRESSION: Moderate right pleural effusion without a pneumothorax.   Electronically Signed   By: Kathreen Devoid   On: 07/04/2015 13:32   Dg Chest 1 View  06/16/2015   CLINICAL DATA:  Post thoracentesis  EXAM: CHEST  1 VIEW  COMPARISON:  05/19/2015  FINDINGS: Moderate  right pleural effusion again noted with right lower lobe atelectasis. No pneumothorax following right thoracentesis. Left lung is clear. Heart is normal size. No acute bony abnormality.  IMPRESSION: Moderate right pleural effusion. No pneumothorax following thoracentesis.   Electronically Signed   By: Rolm Baptise M.D.   On: 06/16/2015 14:03   Dg Chest 2 View  07/07/2015   CLINICAL DATA:  History right-sided pleural effusion and recent thoracentesis with increasing shortness of Breath  EXAM: CHEST - 2 VIEW  COMPARISON:  07/04/2015  FINDINGS: There is been significant increase in the right-sided pleural effusion with  total opacification of the right hemi thorax. The left lung remains clear. The cardiac shadow is stable. The bony structures are unremarkable.  IMPRESSION: Increase in right-sided pleural effusion with opacification of the right hemi thorax.  These results will be called to the ordering clinician or representative by the Radiologist Assistant, and communication documented in the PACS or zVision Dashboard.   Electronically Signed   By: Inez Catalina M.D.   On: 07/07/2015 12:50   Dg Chest 2 View  07/03/2015   CLINICAL DATA:  Shortness of breath and cough for 1 week. Fever earlier in the week. Recent admission. Patient had fluid drawn from the right lung many times. Weakness.  EXAM: CHEST  2 VIEW  COMPARISON:  06/16/2015  FINDINGS: Moderate right-sided pleural effusion, significantly increased since prior study. Mediastinal shift towards the left. Heart size is probably normal. Minimal left basilar subsegmental atelectasis. Surgical clips are present in the upper abdomen.  IMPRESSION: Reaccumulation of right pleural fluid.  Minimal left lower lobe atelectasis.   Electronically Signed   By: Nolon Nations M.D.   On: 07/03/2015 14:32   US Thoracentesis Asp Pleural Space W/img Guide  07/04/2015   INDICATION: Cirrhosis, dyspnea, prior history of breast cancer, recurrent right pleural effusion/ probable  right hepatic hydrothorax ; request is made for diagnostic and therapeutic right thoracentesis.  EXAM: ULTRASOUND GUIDED DIAGNOSTIC AND THERAPEUTIC RIGHT THORACENTESIS  COMPARISON:  Prior thoracentesis on 06/16/2015  MEDICATIONS: None  COMPLICATIONS: None immediate  TECHNIQUE: Informed written consent was obtained from the patient after a discussion of the risks, benefits and alternatives to treatment. A timeout was performed prior to the initiation of the procedure.  Initial ultrasound scanning demonstrates a large right pleural effusion. The lower chest was prepped and draped in the usual sterile fashion. 1% lidocaine was used for local anesthesia.  An ultrasound image was saved for documentation purposes. A 6 Fr Safe-T-Centesis catheter was introduced. The thoracentesis was performed. The catheter was removed and a dressing was applied. The patient tolerated the procedure well without immediate post procedural complication. The patient was escorted to have an upright chest radiograph.  FINDINGS: A total of approximately 2.2 liters of hazy, yellow fluid was removed. Requested samples were sent to the laboratory. Only the above amount of fluid was removed at this time secondary to increased patient coughing.  IMPRESSION: Successful ultrasound-guided diagnostic and therapeutic right sided thoracentesis yielding 2.2 liters of pleural fluid.  Read by: Rowe Robert, PA-C   Electronically Signed   By: Jacqulynn Cadet M.D.   On: 07/04/2015 13:05   US Thoracentesis Asp Pleural Space W/img Guide  06/16/2015   CLINICAL DATA:  Right pleural effusion  EXAM: ULTRASOUND GUIDED RIGHT THORACENTESIS  COMPARISON:  None.  PROCEDURE: An ultrasound guided thoracentesis was thoroughly discussed with the patient and questions answered. The benefits, risks, alternatives and complications were also discussed. The patient understands and wishes to proceed with the procedure. Written consent was obtained.  Ultrasound was performed to  localize and mark an adequate pocket of fluid in the right chest. The area was then prepped and draped in the normal sterile fashion. 1% Lidocaine was used for local anesthesia. Under ultrasound guidance a 19 gauge Yueh catheter was introduced. Thoracentesis was performed. The catheter was removed and a dressing applied.  COMPLICATIONS: None.  FINDINGS: A total of approximately 1.9 L of clear yellow fluid was removed. A fluid sample was notsent for laboratory analysis.  IMPRESSION: Successful ultrasound guided right thoracentesis yielding 1.9 L of pleural  fluid.   Electronically Signed   By: Rolm Baptise M.D.   On: 06/16/2015 14:39    CBC  Recent Labs Lab 07/05/15 0510 07/06/15 0636  WBC 10.2 10.1  HGB 9.6* 11.3*  HCT 29.2* 33.0*  PLT 164 127*  MCV 81.1 79.7  MCH 26.7 27.3  MCHC 32.9 34.2  RDW 15.3 15.3    Chemistries   Recent Labs Lab 07/05/15 0510 07/06/15 0636 07/07/15 0545 07/08/15 0811 07/08/15 1806 07/09/15 0710 07/10/15 0441  NA 139 139 137 137 137 135 137  K 4.1 4.5 4.6 5.5* 4.7 4.8 5.3*  CL 112* 111 109 110 108 108 107  CO2 20* 19* 21* 21* 23 23 25   GLUCOSE 79 93 93 84 107* 95 102*  BUN 32* 30* 33* 38* 36* 33* 32*  CREATININE 1.51* 1.49* 1.48* 1.50* 1.52* 1.39* 1.32*  CALCIUM 7.8* 8.1* 8.0* 8.1* 8.2* 8.0* 8.3*  MG  --  1.9 1.8 1.9  --  1.8 1.8  AST 53* 70*  --  67*  --  61*  --   ALT 20 26  --  23  --  25  --   ALKPHOS 135* 158*  --  144*  --  151*  --   BILITOT 1.1 1.2  --  1.5*  --  1.4*  --    ------------------------------------------------------------------------------------------------------------------ estimated creatinine clearance is 42 mL/min (by C-G formula based on Cr of 1.32). ------------------------------------------------------------------------------------------------------------------ No results for input(s): HGBA1C in the last 72  hours. ------------------------------------------------------------------------------------------------------------------ No results for input(s): CHOL, HDL, LDLCALC, TRIG, CHOLHDL, LDLDIRECT in the last 72 hours. ------------------------------------------------------------------------------------------------------------------ No results for input(s): TSH, T4TOTAL, T3FREE, THYROIDAB in the last 72 hours.  Invalid input(s): FREET3 ------------------------------------------------------------------------------------------------------------------ No results for input(s): VITAMINB12, FOLATE, FERRITIN, TIBC, IRON, RETICCTPCT in the last 72 hours.  Coagulation profile  Recent Labs Lab 07/07/15 1406 07/09/15 0710  INR 1.54* 1.65*    No results for input(s): DDIMER in the last 72 hours.  Cardiac Enzymes No results for input(s): CKMB, TROPONINI, MYOGLOBIN in the last 168 hours.  Invalid input(s): CK ------------------------------------------------------------------------------------------------------------------ Invalid input(s): POCBNP  No results for input(s): GLUCAP in the last 72 hours.   Alba Kriesel M.D. [facility] 07/11/2015, 11:24 AM  Pager: [phone] Between 7am to 7pm - call Pager - [phone]  After 7pm go to [email or web address] - password [email or web address]  Call night coverage person covering after 7pm

## 2015-07-11 NOTE — Progress Notes (Addendum)
Attempted  to wean pt  off the oxygen. Pt was on 3l of [place], was  placed on 2 liter . Pt tolerated well. Currently, pt is on 1 liter of nasal cannula. O2 sats  has  been maintained at mid 90's.  Pt denies SOB. Will cont to monitor.

## 2015-07-12 DIAGNOSIS — J948 Other specified pleural conditions: Secondary | ICD-10-CM | POA: Diagnosis not present

## 2015-07-12 DIAGNOSIS — K769 Liver disease, unspecified: Secondary | ICD-10-CM | POA: Diagnosis not present

## 2015-07-12 DIAGNOSIS — K7031 Alcoholic cirrhosis of liver with ascites: Secondary | ICD-10-CM | POA: Diagnosis not present

## 2015-07-12 LAB — BASIC METABOLIC PANEL
ANION GAP: 6 (ref 5–15)
BUN: 40 mg/dL — ABNORMAL HIGH (ref 6–20)
CALCIUM: 8.3 mg/dL — AB (ref 8.9–10.3)
CHLORIDE: 103 mmol/L (ref 101–111)
CO2: 24 mmol/L (ref 22–32)
Creatinine, Ser: 1.6 mg/dL — ABNORMAL HIGH (ref 0.44–1.00)
GFR calc Af Amer: 40 mL/min — ABNORMAL LOW (ref >60)
GFR calc non Af Amer: 34 mL/min — ABNORMAL LOW (ref >60)
GLUCOSE: 86 mg/dL (ref 65–99)
Potassium: 5.5 mmol/L — ABNORMAL HIGH (ref 3.5–5.1)
Sodium: 133 mmol/L — ABNORMAL LOW (ref 135–145)

## 2015-07-12 MED ORDER — SPIRONOLACTONE 25 MG PO TABS
150.0000 mg | ORAL_TABLET | Freq: Every day | ORAL | Status: DC
  Administered 2015-07-13: 150 mg via ORAL
  Filled 2015-07-12: qty 6

## 2015-07-12 MED ORDER — PHYTONADIONE 5 MG PO TABS
10.0000 mg | ORAL_TABLET | Freq: Every day | ORAL | Status: AC
  Administered 2015-07-12 – 2015-07-14 (×3): 10 mg via ORAL
  Filled 2015-07-12 (×3): qty 2

## 2015-07-12 NOTE — Progress Notes (Signed)
[facility]                                                                              Patient Demographics  Savannah Jackson, is a 59 y.o. female, DOB - Sep 28, 1956, [id number]  Admit date - 07/03/2015   Admitting Physician Florencia Reasons, MD  Outpatient Primary MD for the patient is Rosita Fire, MD  LOS - 9   Chief Complaint  Patient presents with  . Shortness of Breath       Brief HPI   Per Dr. Nehemiah Settle on 9/3 Savannah Jackson is a 59 y.o. female with a history of alcoholic cirrhosis, hepatomegaly, diverticulosis, right pleural effusion status post for centesis every month 3 over the past 3 months. The patient presented with increasing shortness of breath which began 2-3 days after her last thoracentesis. Her symptoms worsened over the past few weeks before admission and worse when ambulating and improved at rest. In the emergency department, nasal cannula was applied which provided oxygen saturation mid 90s, however this worsens when ambulating and her oxygen level drops to high 80s. The patient was admitted for further workup.  Assessment & Plan    Principal Problem: Recurrent hepatic hydrothorax:  - Patient has been having weekly thoracentesis since June 2016. Interventional radiology was consulted, patient underwent thoracentesis on 9/4, cultures so far negative. - Patient was placed on Lasix and spironolactone, O2 - Echo showed grade 1 diastolic dysfunction, otherwise unremarkable. - Gastroenterology was also consulted and did not recommend repeat thoracentesis, recommended to monitor with diuresis - Per patient, dyspnea is improving, per GI, will still likely need a TIPS, IR to follow  Active problems Alcohol cirrhosis/ascites: With history of portal hypertension and varices, prior GI bleed - No prior history of SBP. Currently stable, no encephalopathy. - Reported stopped drinking 4 months ago.  Hyperkalemia - I have decreased spironolactone to  daily, holding tonight's dose  Hypotension with history of hypertension -BP currently stable  Home medications including lisinopril and Lopressor were discontinued  Continue spironolactone and Lasix. Decrease Aldactone. Nadolol was started on 9/9 per GI recommendations as tolerated with BP.  Hypoxia/chronic DOE/chronic respiratory insufficiency due to recurrent pleural effusion: due to recurrent refractory pleural effusion - Patient being successfully weaned on O2, O2 sats 96% on 1 L  Lower extremity edema: likely from cirrhosis - Doppler ultrasound negative for DVT  Code status: Full  Family Communication: Discussed in detail with the patient, all imaging results, lab results explained to the patient    Disposition Plan: Not medically ready.  Time Spent in minutes  25 minutes  Procedures  thoracentesis  Consults   Dementia cardiology, gastroenterology  DVT Prophylaxis  Lovenox   Medications  Scheduled Meds: . enoxaparin (LOVENOX) injection  40 mg Subcutaneous Q24H  . folic acid  1 mg Oral Daily  . furosemide  60 mg Oral BID  . nadolol  10 mg Oral Daily  . pantoprazole  40 mg Oral Daily  . phytonadione  10 mg Oral Daily  . [START ON 07/13/2015] spironolactone  150 mg Oral Daily  . thiamine  100 mg Oral Daily   Continuous Infusions:  PRN Meds:.alum & mag hydroxide-simeth, benzonatate, ondansetron, oxyCODONE, traMADol   Antibiotics   Anti-infectives    None        Subjective:   Savannah Jackson was seen and examined today.  Patient reports that shortness of breath is improving, O2 sats 93% on room air. Denies any pain. No acute events overnight. Afebrile. Patient denies dizziness, chest pain, shortness of breath, abdominal pain, N/V/D/C, new weakness, numbess, tingling.   Objective:   Blood pressure 110/62, pulse 59, temperature 97.8 F (36.6 C), temperature source Oral, resp. rate 16, height 5\' 3"  (1.6 m), weight 67.1 kg (147 lb 14.9 oz), SpO2 93 %.  Wt  Readings from Last 3 Encounters:  07/12/15 67.1 kg (147 lb 14.9 oz)  06/15/15 71.578 kg (157 lb 12.8 oz)  05/13/15 78.744 kg (173 lb 9.6 oz)     Intake/Output Summary (Last 24 hours) at 07/12/15 1315 Last data filed at 07/12/15 [id number]  Gross per 24 hour  Intake    440 ml  Output    950 ml  Net   -510 ml    Exam  General: Alert and oriented x 3, NAD  HEENT:  PERRLA, EOMI, Anicteric Sclera  Neck: Supple, no JVD,  CVS: S1 S2 clear, RRR  Respiratory: Diminished on the right  Abdomen: Soft, nontender, nondistended, + bowel sounds  Ext: no cyanosis clubbing, 1+ edema  Neuro: no new deficits   Skin: No rashes  Psych: Normal affect and demeanor, alert and oriented x3    Data Review   Micro Results Recent Results (from the past 240 hour(s))  Culture, body fluid-bottle     Status: None   Collection Time: 07/04/15 12:37 PM  Result Value Ref Range Status   Specimen Description FLUID RIGHT PLEURAL  Final   Special Requests BOTTLES DRAWN AEROBIC AND ANAEROBIC 10CC  Final   Culture NO GROWTH 5 DAYS  Final   Report Status 07/09/2015 FINAL  Final  Gram stain     Status: None   Collection Time: 07/04/15 12:37 PM  Result Value Ref Range Status   Specimen Description FLUID RIGHT PLEURAL  Final   Special Requests NONE  Final   Gram Stain   Final    ABUNDANT WBC PRESENT,BOTH PMN AND MONONUCLEAR NO ORGANISMS SEEN GRAM STAIN REVIEWED-AGREE WITH RESULT M VESTAL    Report Status 07/04/2015 FINAL  Final    Radiology Reports Dg Chest 1 View  07/04/2015   CLINICAL DATA:  Status post right thoracentesis.  EXAM: CHEST  1 VIEW  COMPARISON:  07/03/2015  FINDINGS: There is a moderate right pleural effusion which has decreased in size compared with 07/03/2015. There is no right pneumothorax. There is no left pleural effusion. There is no left pneumothorax. There is no focal consolidation. The heart and mediastinal contours are stable.  The osseous structures are unremarkable.  IMPRESSION:  Moderate right pleural effusion without a pneumothorax.   Electronically Signed   By: Kathreen Devoid   On: 07/04/2015 13:32   Dg Chest 1 View  06/16/2015   CLINICAL DATA:  Post thoracentesis  EXAM: CHEST  1 VIEW  COMPARISON:  05/19/2015  FINDINGS: Moderate right pleural effusion again noted with right lower lobe atelectasis. No pneumothorax following right thoracentesis. Left lung is clear. Heart is normal size. No acute bony abnormality.  IMPRESSION: Moderate right pleural effusion. No pneumothorax following thoracentesis.   Electronically Signed   By: Rolm Baptise M.D.   On: 06/16/2015 14:03   Dg Chest 2 View  07/07/2015   CLINICAL DATA:  History right-sided pleural effusion and recent thoracentesis with increasing shortness of Breath  EXAM: CHEST - 2 VIEW  COMPARISON:  07/04/2015  FINDINGS: There is been significant increase in the right-sided pleural effusion with total opacification of the right hemi thorax. The left lung remains clear. The cardiac shadow is stable. The bony structures are unremarkable.  IMPRESSION: Increase in right-sided pleural effusion with opacification of the right hemi thorax.  These results will be called to the ordering clinician or representative by the Radiologist Assistant, and communication documented in the PACS or zVision Dashboard.   Electronically Signed   By: Inez Catalina M.D.   On: 07/07/2015 12:50   Dg Chest 2 View  07/03/2015   CLINICAL DATA:  Shortness of breath and cough for 1 week. Fever earlier in the week. Recent admission. Patient had fluid drawn from the right lung many times. Weakness.  EXAM: CHEST  2 VIEW  COMPARISON:  06/16/2015  FINDINGS: Moderate right-sided pleural effusion, significantly increased since prior study. Mediastinal shift towards the left. Heart size is probably normal. Minimal left basilar subsegmental atelectasis. Surgical clips are present in the upper abdomen.  IMPRESSION: Reaccumulation of right pleural fluid.  Minimal left lower lobe  atelectasis.   Electronically Signed   By: Nolon Nations M.D.   On: 07/03/2015 14:32   US Thoracentesis Asp Pleural Space W/img Guide  07/04/2015   INDICATION: Cirrhosis, dyspnea, prior history of breast cancer, recurrent right pleural effusion/ probable right hepatic hydrothorax ; request is made for diagnostic and therapeutic right thoracentesis.  EXAM: ULTRASOUND GUIDED DIAGNOSTIC AND THERAPEUTIC RIGHT THORACENTESIS  COMPARISON:  Prior thoracentesis on 06/16/2015  MEDICATIONS: None  COMPLICATIONS: None immediate  TECHNIQUE: Informed written consent was obtained from the patient after a discussion of the risks, benefits and alternatives to treatment. A timeout was performed prior to the initiation of the procedure.  Initial ultrasound scanning demonstrates a large right pleural effusion. The lower chest was prepped and draped in the usual sterile fashion. 1% lidocaine was used for local anesthesia.  An ultrasound image was saved for documentation purposes. A 6 Fr Safe-T-Centesis catheter was introduced. The thoracentesis was performed. The catheter was removed and a dressing was applied. The patient tolerated the procedure well without immediate post procedural complication. The patient was escorted to have an upright chest radiograph.  FINDINGS: A total of approximately 2.2 liters of hazy, yellow fluid was removed. Requested samples were sent to the laboratory. Only the above amount of fluid was removed at this time secondary to increased patient coughing.  IMPRESSION: Successful ultrasound-guided diagnostic and therapeutic right sided thoracentesis yielding 2.2 liters of pleural fluid.  Read by: Rowe Robert, PA-C   Electronically Signed   By: Jacqulynn Cadet M.D.   On: 07/04/2015 13:05   US Thoracentesis Asp Pleural Space W/img Guide  06/16/2015   CLINICAL DATA:  Right pleural effusion  EXAM: ULTRASOUND GUIDED RIGHT THORACENTESIS  COMPARISON:  None.  PROCEDURE: An ultrasound guided thoracentesis was  thoroughly discussed with the patient and questions answered. The benefits, risks, alternatives and complications were also discussed. The patient understands and wishes to proceed with the procedure. Written consent was obtained.  Ultrasound was performed to localize and mark an adequate pocket of fluid in the right chest. The area was then prepped and draped in the normal sterile fashion. 1% Lidocaine was used for local anesthesia. Under ultrasound guidance a 19 gauge Yueh catheter was introduced. Thoracentesis was performed. The catheter was removed  and a dressing applied.  COMPLICATIONS: None.  FINDINGS: A total of approximately 1.9 L of clear yellow fluid was removed. A fluid sample was notsent for laboratory analysis.  IMPRESSION: Successful ultrasound guided right thoracentesis yielding 1.9 L of pleural fluid.   Electronically Signed   By: Rolm Baptise M.D.   On: 06/16/2015 14:39    CBC  Recent Labs Lab 07/06/15 0636  WBC 10.1  HGB 11.3*  HCT 33.0*  PLT 127*  MCV 79.7  MCH 27.3  MCHC 34.2  RDW 15.3    Chemistries   Recent Labs Lab 07/06/15 0636 07/07/15 0545 07/08/15 0811 07/08/15 1806 07/09/15 0710 07/10/15 0441 07/12/15 0521  NA 139 137 137 137 135 137 133*  K 4.5 4.6 5.5* 4.7 4.8 5.3* 5.5*  CL 111 109 110 108 108 107 103  CO2 19* 21* 21* 23 23 25 24   GLUCOSE 93 93 84 107* 95 102* 86  BUN 30* 33* 38* 36* 33* 32* 40*  CREATININE 1.49* 1.48* 1.50* 1.52* 1.39* 1.32* 1.60*  CALCIUM 8.1* 8.0* 8.1* 8.2* 8.0* 8.3* 8.3*  MG 1.9 1.8 1.9  --  1.8 1.8  --   AST 70*  --  67*  --  61*  --   --   ALT 26  --  23  --  25  --   --   ALKPHOS 158*  --  144*  --  151*  --   --   BILITOT 1.2  --  1.5*  --  1.4*  --   --    ------------------------------------------------------------------------------------------------------------------ estimated creatinine clearance is 34.8 mL/min (by C-G formula based on Cr of  1.6). ------------------------------------------------------------------------------------------------------------------ No results for input(s): HGBA1C in the last 72 hours. ------------------------------------------------------------------------------------------------------------------ No results for input(s): CHOL, HDL, LDLCALC, TRIG, CHOLHDL, LDLDIRECT in the last 72 hours. ------------------------------------------------------------------------------------------------------------------ No results for input(s): TSH, T4TOTAL, T3FREE, THYROIDAB in the last 72 hours.  Invalid input(s): FREET3 ------------------------------------------------------------------------------------------------------------------ No results for input(s): VITAMINB12, FOLATE, FERRITIN, TIBC, IRON, RETICCTPCT in the last 72 hours.  Coagulation profile  Recent Labs Lab 07/07/15 1406 07/09/15 0710  INR 1.54* 1.65*    No results for input(s): DDIMER in the last 72 hours.  Cardiac Enzymes No results for input(s): CKMB, TROPONINI, MYOGLOBIN in the last 168 hours.  Invalid input(s): CK ------------------------------------------------------------------------------------------------------------------ Invalid input(s): POCBNP  No results for input(s): GLUCAP in the last 72 hours.   Avanti Jetter M.D. [facility] 07/12/2015, 1:15 PM  Pager: [phone] Between 7am to 7pm - call Pager - [phone]  After 7pm go to [email or web address] - password [email or web address]  Call night coverage person covering after 7pm

## 2015-07-12 NOTE — Progress Notes (Signed)
Daily Rounding Note  07/12/2015, 9:32 AM  LOS: 9 days   SUBJECTIVE:       Oxygen sats of 93% while resting on room air this AM.  Still with DOE and sometimes at rest.  She down plays her dyspnea.   OBJECTIVE:         Vital signs in last 24 hours:    Temp:  [97.8 F (36.6 C)-98.6 F (37 C)] 97.8 F (36.6 C) (09/12 [id number]) Pulse Rate:  [58-65] 59 (09/12 0831) Resp:  [16-18] 16 (09/12 0614) BP: (110-121)/(54-72) 110/62 mmHg (09/12 0831) SpO2:  [93 %-98 %] 93 % (09/12 0614) Weight:  [147 lb 14.9 oz (67.1 kg)] 147 lb 14.9 oz (67.1 kg) (09/12 0614) Last BM Date: 07/11/15 (per pt) Filed Weights   07/10/15 0500 07/11/15 0500 07/12/15 0614  Weight: 144 lb 8 oz (65.545 kg) 146 lb 2.6 oz (66.3 kg) 147 lb 14.9 oz (67.1 kg)   General: pleasant, comfortable, not ill looking   Heart: RRR Chest: bs nearly absent throughout left Abdomen: soft, NT, ND.  Active BS  Extremities: minore pedal/ankle edema, not pitting Neuro/Psych:  Pleasant, no tremor or asterixis.  Calm.  Oriented x 3.    Intake/Output from previous day: 09/11 0701 - 09/12 0700 In: 560 [P.O.:560] Out: 950 [Urine:950]  Intake/Output this shift:    Lab Results: No results for input(s): WBC, HGB, HCT, PLT in the last 72 hours. BMET  Recent Labs  07/10/15 0441 07/12/15 0521  NA 137 133*  K 5.3* 5.5*  CL 107 103  CO2 25 24  GLUCOSE 102* 86  BUN 32* 40*  CREATININE 1.32* 1.60*  CALCIUM 8.3* 8.3*   LFT No results for input(s): PROT, ALBUMIN, AST, ALT, ALKPHOS, BILITOT, BILIDIR, IBILI in the last 72 hours. PT/INR No results for input(s): LABPROT, INR in the last 72 hours. Hepatitis Panel No results for input(s): HEPBSAG, HCVAB, HEPAIGM, HEPBIGM in the last 72 hours.  Studies/Results: No results found.   Scheduled Meds: . enoxaparin (LOVENOX) injection  40 mg Subcutaneous Q24H  . folic acid  1 mg Oral Daily  . furosemide  60 mg Oral BID  . nadolol  10  mg Oral Daily  . pantoprazole  40 mg Oral Daily  . spironolactone  150 mg Oral BID  . thiamine  100 mg Oral Daily   Continuous Infusions:  PRN Meds:.alum & mag hydroxide-simeth, benzonatate, ondansetron, oxyCODONE, traMADol   ASSESMENT:   *  Hepatic hydrothorax Lasix, aldactone reinitiated last week, doses progressively increased.  At 60/150 mg now. On Nadolol.  Last thoracentesis 2.2 liters: 9/4 Weight up 3# in last 2 days.  BUN/Creatinine increased in last 2 days Last CXR 9/7 with increasing right effusion.  IR consulted 9/8 regarding potential need for TIPS. They plan to see her today or tomorrow.  Clinically looks like she will need the TIPS  *  Renal insufficiency.  Likely effect of diuretics.   *  Hyperkalemia.    *  Coagulopathy. Slowly progressing. On Lovenox DVT prophylaxis.    PLAN   *  Added po vitamin K for 3 days.  *  Await IR opinion.   *  Hyperkalemia mgt per PMD.  May need to hold the pm aldactone.     Azucena Freed  07/12/2015, 9:32 AM Pager: [phone]  Attending Addendum: I have taken an interval history, reviewed the chart, and examined the patient. I agree with the Advanced Practitioner's note and  impression. Overall, not making much progress with hepatic hydrothorax despite increasing doses of diuretics over the past week, easily desaturates off oxygen. Mild increase in Cr and K today, will hold evening dose of aldactone. Will reassess renal function and status in the AM, but it appears diuretics are not improving her condition as we would have hoped. If she does not appear improved tomorrow we will have radiology reassess her for TIPS. I have discussed risks / benefits of TIPS with her previously, she is agreeable to proceed if she needs it. Will repeat CXR tomorrow. She agreed.   [place] Cellar, MD [facility] Pager [phone]

## 2015-07-13 LAB — BASIC METABOLIC PANEL
ANION GAP: 8 (ref 5–15)
BUN: 40 mg/dL — AB (ref 6–20)
CALCIUM: 8.6 mg/dL — AB (ref 8.9–10.3)
CO2: 24 mmol/L (ref 22–32)
Chloride: 105 mmol/L (ref 101–111)
Creatinine, Ser: 1.47 mg/dL — ABNORMAL HIGH (ref 0.44–1.00)
GFR calc Af Amer: 44 mL/min — ABNORMAL LOW (ref >60)
GFR, EST NON AFRICAN AMERICAN: 38 mL/min — AB (ref >60)
GLUCOSE: 83 mg/dL (ref 65–99)
Potassium: 5.2 mmol/L — ABNORMAL HIGH (ref 3.5–5.1)
SODIUM: 137 mmol/L (ref 135–145)

## 2015-07-13 MED ORDER — SPIRONOLACTONE 25 MG PO TABS
150.0000 mg | ORAL_TABLET | Freq: Two times a day (BID) | ORAL | Status: DC
  Administered 2015-07-13 – 2015-07-15 (×5): 150 mg via ORAL
  Filled 2015-07-13 (×5): qty 6

## 2015-07-13 NOTE — Progress Notes (Signed)
Daily Rounding Note  07/13/2015, 8:03 AM  LOS: 10 days   SUBJECTIVE:       Still SOB.  Appetite poor.  Daily BMs.  No nausea  OBJECTIVE:         Vital signs in last 24 hours:    Temp:  [97.8 F (36.6 C)-98.5 F (36.9 C)] 98.5 F (36.9 C) (09/13 0547) Pulse Rate:  [56-109] 60 (09/13 0547) Resp:  [18] 18 (09/13 0547) BP: (92-118)/(48-65) 99/60 mmHg (09/13 0547) SpO2:  [96 %-100 %] 96 % (09/13 0547) Weight:  [148 lb 13 oz (67.5 kg)] 148 lb 13 oz (67.5 kg) (09/12 2114) Last BM Date: 07/11/15 (per pt) Filed Weights   07/11/15 0500 07/12/15 0614 07/12/15 2114  Weight: 146 lb 2.6 oz (66.3 kg) 147 lb 14.9 oz (67.1 kg) 148 lb 13 oz (67.5 kg)   General: looks comfortable, slightly ill   Heart: RRR Chest: diminished globally on left lung Abdomen: soft, NT, ND  Extremities: No CCE. Neuro/Psych:  Comfortable, pleasant, oriented 3. Fully alert.  Intake/Output from previous day: 09/12 0701 - 09/13 0700 In: 240 [P.O.:240] Out: 1250 [Urine:1250]  Intake/Output this shift:    Lab Results: No results for input(s): WBC, HGB, HCT, PLT in the last 72 hours. BMET  Recent Labs  07/12/15 0521  NA 133*  K 5.5*  CL 103  CO2 24  GLUCOSE 86  BUN 40*  CREATININE 1.60*  CALCIUM 8.3*   LFT No results for input(s): PROT, ALBUMIN, AST, ALT, ALKPHOS, BILITOT, BILIDIR, IBILI in the last 72 hours. PT/INR No results for input(s): LABPROT, INR in the last 72 hours. Hepatitis Panel No results for input(s): HEPBSAG, HCVAB, HEPAIGM, HEPBIGM in the last 72 hours.  Studies/Results: No results found.  Scheduled Meds: . enoxaparin (LOVENOX) injection  40 mg Subcutaneous Q24H  . folic acid  1 mg Oral Daily  . furosemide  60 mg Oral BID  . nadolol  10 mg Oral Daily  . pantoprazole  40 mg Oral Daily  . phytonadione  10 mg Oral Daily  . spironolactone  150 mg Oral Daily  . thiamine  100 mg Oral Daily   Continuous Infusions:  PRN  Meds:.alum & mag hydroxide-simeth, benzonatate, ondansetron, oxyCODONE, traMADol Scheduled Meds: . enoxaparin (LOVENOX) injection  40 mg Subcutaneous Q24H  . folic acid  1 mg Oral Daily  . furosemide  60 mg Oral BID  . nadolol  10 mg Oral Daily  . pantoprazole  40 mg Oral Daily  . phytonadione  10 mg Oral Daily  . spironolactone  150 mg Oral Daily  . thiamine  100 mg Oral Daily   Continuous Infusions:  PRN Meds:.alum & mag hydroxide-simeth, benzonatate, ondansetron, oxyCODONE, traMADol   ASSESMENT:   * Hepatic hydrothorax Lasix, aldactone reinitiated last week, doses progressively increased. aldactone dose decreased yesterday due to hyperkalemia. On Nadolol. 2 gm NA diet.  Last thoracentesis 2.2 liters: 9/4 Weight up 3# in last 2 days.  BUN/Creatinine increased in last 2 days Last CXR 9/7 with increasing right effusion.  IR consulted 9/8 regarding potential need for TIPS. They plan to see her today or tomorrow.  Clinically looks like she will need the TIPS  * Renal insufficiency. Likely effect of diuretics.  Increasing BUN/CReat.   * Hyperkalemia.   * Coagulopathy. Slowly progressing. On Lovenox DVT prophylaxis.     PLAN   *  IR is working on scheduling TIPS later this week.  Have  not yet spoken with pt today.    *  Need to get BMET now. Phlebotomy now at bedside. Depending on labs, may need to further adjust diuretics.       Azucena Freed  07/13/2015, 8:03 AM Pager: [phone]   Attending Addendum: I have taken an interval history, reviewed the chart, and examined the patient. I agree with the Advanced Practitioner's note and impression. Over the past week we have escalated diuretics, now on lasix 40mg  TID and aldactone 300mg  daily. Last night's aldactone was held due to increase in Cr and mild K increase. Renal function improved today, will resume regular dosing. I am concerned over time she has not responded to higher dose diuretics and no significant change  in her oxygen requirement. I think she is a good candidate for TIPS at this time and IR will evaluate her for this. Continue diuretics in the interim and monitor renal function.   [place] Cellar, MD [facility] Pager [phone]

## 2015-07-13 NOTE — Progress Notes (Signed)
IR aware of possible TIPS procedure needed while still inpatient, will discuss available anesthesia scheduling and possible TIPS procedure later this week with IR scheduling.   Tsosie Billing PA-C Interventional Radiology  07/13/15  9:42 AM

## 2015-07-13 NOTE — Progress Notes (Signed)
[facility]                                                                              Patient Demographics  Savannah Jackson, is a 59 y.o. female, DOB - [birth year], UA:[id number]  Admit date - 07/03/2015   Admitting Physician Florencia Reasons, MD  Outpatient Primary MD for the patient is Rosita Fire, MD  LOS - 10   Chief Complaint  Patient presents with  . Shortness of Breath       Brief HPI   Per Dr. Nehemiah Settle on 9/3 AZZURE Jackson is a 59 y.o. female with a history of alcoholic cirrhosis, hepatomegaly, diverticulosis, right pleural effusion status post for centesis every month 3 over the past 3 months. The patient presented with increasing shortness of breath which began 2-3 days after her last thoracentesis. Her symptoms worsened over the past few weeks before admission and worse when ambulating and improved at rest. In the emergency department, nasal cannula was applied which provided oxygen saturation mid 90s, however this worsens when ambulating and her oxygen level drops to high 80s. The patient was admitted for further workup.  Assessment & Plan    Principal Problem: Recurrent hepatic hydrothorax:  - Patient has been having weekly thoracentesis since June 2016. Interventional radiology was consulted, patient underwent thoracentesis on 9/4, cultures so far negative. - Patient was placed on Lasix and spironolactone, O2 - Echo showed grade 1 diastolic dysfunction, otherwise unremarkable. - Gastroenterology was also consulted and did not recommend repeat thoracentesis, recommended to monitor with diuresis - per GI, will still likely need a TIPS. Discussed with IR PA, Koreen, will try to schedule TIPS as early as possible with OR scheduling  Active problems Alcohol cirrhosis/ascites: With history of portal hypertension and varices, prior GI bleed - No prior history of SBP. Currently stable, no encephalopathy. - Reported stopped drinking 4 months  ago.  Hyperkalemia Potassium trending down  Hypotension with history of hypertension -BP currently stable  Home medications including lisinopril and Lopressor were discontinued  Continue spironolactone and Lasix. Aldactone was decreased yesterday because of hyperkalemia. Gastroenterology increased Aldactone today due to improvement in potassium and the creatinine function. Nadolol was started on 9/9 per GI recommendations as tolerated with BP.  Hypoxia/chronic DOE/chronic respiratory insufficiency due to recurrent pleural effusion: due to recurrent refractory pleural effusion - Patient being successfully weaned on O2, O2 sats 96% on 1 L  Lower extremity edema: likely from cirrhosis - Doppler ultrasound negative for DVT  Code status: Full  Family Communication: Discussed in detail with the patient, all imaging results, lab results explained to the patient    Disposition Plan: Not medically ready. Awaiting TIPS.  Time Spent in minutes  25 minutes  Procedures  thoracentesis  Consults   Dementia cardiology, gastroenterology  DVT Prophylaxis  Lovenox   Medications  Scheduled Meds: . enoxaparin (LOVENOX) injection  40 mg Subcutaneous Q24H  . folic acid  1 mg Oral Daily  . furosemide  60 mg Oral BID  . nadolol  10 mg Oral Daily  . pantoprazole  40 mg Oral Daily  . phytonadione  10 mg Oral Daily  .  spironolactone  150 mg Oral BID  . thiamine  100 mg Oral Daily   Continuous Infusions:  PRN Meds:.alum & mag hydroxide-simeth, benzonatate, ondansetron, oxyCODONE, traMADol   Antibiotics   Anti-infectives    None        Subjective:   Solana Machamer was seen and examined today.  Denies any specific complaints. Still has significant peripheral edema. Patient denies dizziness, chest pain, abdominal pain, N/V/D/C, new weakness, numbess, tingling. Still somewhat short of breath  Objective:   Blood pressure 103/74, pulse 60, temperature 98.5 F (36.9 C), temperature  source Oral, resp. rate 18, height 5\' 3"  (1.6 m), weight 67.5 kg (148 lb 13 oz), SpO2 96 %.  Wt Readings from Last 3 Encounters:  07/12/15 67.5 kg (148 lb 13 oz)  06/15/15 71.578 kg (157 lb 12.8 oz)  05/13/15 78.744 kg (173 lb 9.6 oz)     Intake/Output Summary (Last 24 hours) at 07/13/15 1253 Last data filed at 07/13/15 1022  Gross per 24 hour  Intake    240 ml  Output   1601 ml  Net  -1361 ml    Exam  General: Alert and oriented x 3, NAD  HEENT:  PERRLA, EOMI  Neck: Supple  CVS: S1 S2 clear, RRR  Respiratory: Diminished on the right  Abdomen: Soft, nontender, nondistended, + bowel sounds  Ext: no cyanosis clubbing, 1-2+ edema  Neuro: no new deficits   Skin: No rashes  Psych: Normal affect and demeanor, alert and oriented x3    Data Review   Micro Results Recent Results (from the past 240 hour(s))  Culture, body fluid-bottle     Status: None   Collection Time: 07/04/15 12:37 PM  Result Value Ref Range Status   Specimen Description FLUID RIGHT PLEURAL  Final   Special Requests BOTTLES DRAWN AEROBIC AND ANAEROBIC 10CC  Final   Culture NO GROWTH 5 DAYS  Final   Report Status 07/09/2015 FINAL  Final  Gram stain     Status: None   Collection Time: 07/04/15 12:37 PM  Result Value Ref Range Status   Specimen Description FLUID RIGHT PLEURAL  Final   Special Requests NONE  Final   Gram Stain   Final    ABUNDANT WBC PRESENT,BOTH PMN AND MONONUCLEAR NO ORGANISMS SEEN GRAM STAIN REVIEWED-AGREE WITH RESULT M VESTAL    Report Status 07/04/2015 FINAL  Final    Radiology Reports Dg Chest 1 View  07/04/2015   CLINICAL DATA:  Status post right thoracentesis.  EXAM: CHEST  1 VIEW  COMPARISON:  07/03/2015  FINDINGS: There is a moderate right pleural effusion which has decreased in size compared with 07/03/2015. There is no right pneumothorax. There is no left pleural effusion. There is no left pneumothorax. There is no focal consolidation. The heart and mediastinal  contours are stable.  The osseous structures are unremarkable.  IMPRESSION: Moderate right pleural effusion without a pneumothorax.   Electronically Signed   By: Kathreen Devoid   On: 07/04/2015 13:32   Dg Chest 1 View  06/16/2015   CLINICAL DATA:  Post thoracentesis  EXAM: CHEST  1 VIEW  COMPARISON:  05/19/2015  FINDINGS: Moderate right pleural effusion again noted with right lower lobe atelectasis. No pneumothorax following right thoracentesis. Left lung is clear. Heart is normal size. No acute bony abnormality.  IMPRESSION: Moderate right pleural effusion. No pneumothorax following thoracentesis.   Electronically Signed   By: Rolm Baptise M.D.   On: 06/16/2015 14:03   Dg Chest 2 View  07/07/2015   CLINICAL DATA:  History right-sided pleural effusion and recent thoracentesis with increasing shortness of Breath  EXAM: CHEST - 2 VIEW  COMPARISON:  07/04/2015  FINDINGS: There is been significant increase in the right-sided pleural effusion with total opacification of the right hemi thorax. The left lung remains clear. The cardiac shadow is stable. The bony structures are unremarkable.  IMPRESSION: Increase in right-sided pleural effusion with opacification of the right hemi thorax.  These results will be called to the ordering clinician or representative by the Radiologist Assistant, and communication documented in the PACS or zVision Dashboard.   Electronically Signed   By: Inez Catalina M.D.   On: 07/07/2015 12:50   Dg Chest 2 View  07/03/2015   CLINICAL DATA:  Shortness of breath and cough for 1 week. Fever earlier in the week. Recent admission. Patient had fluid drawn from the right lung many times. Weakness.  EXAM: CHEST  2 VIEW  COMPARISON:  06/16/2015  FINDINGS: Moderate right-sided pleural effusion, significantly increased since prior study. Mediastinal shift towards the left. Heart size is probably normal. Minimal left basilar subsegmental atelectasis. Surgical clips are present in the upper abdomen.   IMPRESSION: Reaccumulation of right pleural fluid.  Minimal left lower lobe atelectasis.   Electronically Signed   By: Nolon Nations M.D.   On: 07/03/2015 14:32   US Thoracentesis Asp Pleural Space W/img Guide  07/04/2015   INDICATION: Cirrhosis, dyspnea, prior history of breast cancer, recurrent right pleural effusion/ probable right hepatic hydrothorax ; request is made for diagnostic and therapeutic right thoracentesis.  EXAM: ULTRASOUND GUIDED DIAGNOSTIC AND THERAPEUTIC RIGHT THORACENTESIS  COMPARISON:  Prior thoracentesis on 06/16/2015  MEDICATIONS: None  COMPLICATIONS: None immediate  TECHNIQUE: Informed written consent was obtained from the patient after a discussion of the risks, benefits and alternatives to treatment. A timeout was performed prior to the initiation of the procedure.  Initial ultrasound scanning demonstrates a large right pleural effusion. The lower chest was prepped and draped in the usual sterile fashion. 1% lidocaine was used for local anesthesia.  An ultrasound image was saved for documentation purposes. A 6 Fr Safe-T-Centesis catheter was introduced. The thoracentesis was performed. The catheter was removed and a dressing was applied. The patient tolerated the procedure well without immediate post procedural complication. The patient was escorted to have an upright chest radiograph.  FINDINGS: A total of approximately 2.2 liters of hazy, yellow fluid was removed. Requested samples were sent to the laboratory. Only the above amount of fluid was removed at this time secondary to increased patient coughing.  IMPRESSION: Successful ultrasound-guided diagnostic and therapeutic right sided thoracentesis yielding 2.2 liters of pleural fluid.  Read by: Rowe Robert, PA-C   Electronically Signed   By: Jacqulynn Cadet M.D.   On: 07/04/2015 13:05   US Thoracentesis Asp Pleural Space W/img Guide  06/16/2015   CLINICAL DATA:  Right pleural effusion  EXAM: ULTRASOUND GUIDED RIGHT  THORACENTESIS  COMPARISON:  None.  PROCEDURE: An ultrasound guided thoracentesis was thoroughly discussed with the patient and questions answered. The benefits, risks, alternatives and complications were also discussed. The patient understands and wishes to proceed with the procedure. Written consent was obtained.  Ultrasound was performed to localize and mark an adequate pocket of fluid in the right chest. The area was then prepped and draped in the normal sterile fashion. 1% Lidocaine was used for local anesthesia. Under ultrasound guidance a 19 gauge Yueh catheter was introduced. Thoracentesis was performed. The catheter was removed  and a dressing applied.  COMPLICATIONS: None.  FINDINGS: A total of approximately 1.9 L of clear yellow fluid was removed. A fluid sample was notsent for laboratory analysis.  IMPRESSION: Successful ultrasound guided right thoracentesis yielding 1.9 L of pleural fluid.   Electronically Signed   By: Rolm Baptise M.D.   On: 06/16/2015 14:39    CBC No results for input(s): WBC, HGB, HCT, PLT, MCV, MCH, MCHC, RDW, LYMPHSABS, MONOABS, EOSABS, BASOSABS, BANDABS in the last 168 hours.  Invalid input(s): NEUTRABS, BANDSABD  Chemistries   Recent Labs Lab 07/07/15 0545 07/08/15 0811 07/08/15 1806 07/09/15 0710 07/10/15 0441 07/12/15 0521 07/13/15 1005  NA 137 137 137 135 137 133* 137  K 4.6 5.5* 4.7 4.8 5.3* 5.5* 5.2*  CL 109 110 108 108 107 103 105  CO2 21* 21* 23 23 25 24 24   GLUCOSE 93 84 107* 95 102* 86 83  BUN 33* 38* 36* 33* 32* 40* 40*  CREATININE 1.48* 1.50* 1.52* 1.39* 1.32* 1.60* 1.47*  CALCIUM 8.0* 8.1* 8.2* 8.0* 8.3* 8.3* 8.6*  MG 1.8 1.9  --  1.8 1.8  --   --   AST  --  67*  --  61*  --   --   --   ALT  --  23  --  25  --   --   --   ALKPHOS  --  144*  --  151*  --   --   --   BILITOT  --  1.5*  --  1.4*  --   --   --    ------------------------------------------------------------------------------------------------------------------ estimated  creatinine clearance is 38 mL/min (by C-G formula based on Cr of 1.47). ------------------------------------------------------------------------------------------------------------------ No results for input(s): HGBA1C in the last 72 hours. ------------------------------------------------------------------------------------------------------------------ No results for input(s): CHOL, HDL, LDLCALC, TRIG, CHOLHDL, LDLDIRECT in the last 72 hours. ------------------------------------------------------------------------------------------------------------------ No results for input(s): TSH, T4TOTAL, T3FREE, THYROIDAB in the last 72 hours.  Invalid input(s): FREET3 ------------------------------------------------------------------------------------------------------------------ No results for input(s): VITAMINB12, FOLATE, FERRITIN, TIBC, IRON, RETICCTPCT in the last 72 hours.  Coagulation profile  Recent Labs Lab 07/07/15 1406 07/09/15 0710  INR 1.54* 1.65*    No results for input(s): DDIMER in the last 72 hours.  Cardiac Enzymes No results for input(s): CKMB, TROPONINI, MYOGLOBIN in the last 168 hours.  Invalid input(s): CK ------------------------------------------------------------------------------------------------------------------ Invalid input(s): POCBNP  No results for input(s): GLUCAP in the last 72 hours.   Neng Albee M.D. [facility] 07/13/2015, 12:53 PM  Pager: [phone] Between 7am to 7pm - call Pager - [phone]  After 7pm go to [email or web address] - password [email or web address]  Call night coverage person covering after 7pm

## 2015-07-14 LAB — COMPREHENSIVE METABOLIC PANEL
ALT: 22 U/L (ref 14–54)
ANION GAP: 7 (ref 5–15)
AST: 52 U/L — ABNORMAL HIGH (ref 15–41)
Albumin: 1.6 g/dL — ABNORMAL LOW (ref 3.5–5.0)
Alkaline Phosphatase: 138 U/L — ABNORMAL HIGH (ref 38–126)
BUN: 36 mg/dL — ABNORMAL HIGH (ref 6–20)
CHLORIDE: 105 mmol/L (ref 101–111)
CO2: 23 mmol/L (ref 22–32)
Calcium: 8.4 mg/dL — ABNORMAL LOW (ref 8.9–10.3)
Creatinine, Ser: 1.34 mg/dL — ABNORMAL HIGH (ref 0.44–1.00)
GFR, EST AFRICAN AMERICAN: 49 mL/min — AB (ref >60)
GFR, EST NON AFRICAN AMERICAN: 42 mL/min — AB (ref >60)
Glucose, Bld: 83 mg/dL (ref 65–99)
POTASSIUM: 5.1 mmol/L (ref 3.5–5.1)
SODIUM: 135 mmol/L (ref 135–145)
Total Bilirubin: 1 mg/dL (ref 0.3–1.2)
Total Protein: 5.1 g/dL — ABNORMAL LOW (ref 6.5–8.1)

## 2015-07-14 LAB — PROTIME-INR
INR: 1.45 (ref 0.00–1.49)
PROTHROMBIN TIME: 17.8 s — AB (ref 11.6–15.2)

## 2015-07-14 NOTE — Progress Notes (Signed)
[facility]                                                                              Patient Demographics  Savannah Jackson, is a 59 y.o. female, DOB - 1956-07-22, [id number]  Admit date - 07/03/2015   Admitting Physician Florencia Reasons, MD  Outpatient Primary MD for the patient is [facility], MD  LOS - 11   Chief Complaint  Patient presents with  . Shortness of Breath       Brief HPI   Per Dr. Nehemiah Settle on 9/3 Savannah Jackson is a 59 y.o. female with a history of alcoholic cirrhosis, hepatomegaly, diverticulosis, right pleural effusion status post for centesis every month 3 over the past 3 months. The patient presented with increasing shortness of breath which began 2-3 days after her last thoracentesis. Her symptoms worsened over the past few weeks before admission and worse when ambulating and improved at rest. In the emergency department, nasal cannula was applied which provided oxygen saturation mid 90s, however this worsens when ambulating and her oxygen level drops to high 80s. The patient was admitted for further workup.  Assessment & Plan    Principal Problem: Recurrent hepatic hydrothorax:  - Patient has been having weekly thoracentesis since June 2016. Interventional radiology was consulted, patient underwent thoracentesis on 9/4, cultures so far negative. - Patient was placed on Lasix and spironolactone, O2 - Echo showed grade 1 diastolic dysfunction, otherwise unremarkable. - Gastroenterology was also consulted and did not recommend repeat thoracentesis, recommended to monitor with diuresis - TIPS procedure scheduled for 9/16 at 11 AM  Active problems Alcohol cirrhosis/ascites: With history of portal hypertension and varices, prior GI bleed - No prior history of SBP. Currently stable, no encephalopathy. - Reported stopped drinking 4 months ago.  Hyperkalemia Potassium trending down  Hypotension with history of hypertension -BP currently  stable  Home medications including lisinopril and Lopressor were discontinued  Continue spironolactone and Lasix.  Nadolol was started on 9/9 per GI recommendations as tolerated with BP.  Hypoxia/chronic DOE/chronic respiratory insufficiency due to recurrent pleural effusion: due to recurrent refractory pleural effusion - Patient being successfully weaned on O2, O2 sats 96% on 1 L  Lower extremity edema: likely from cirrhosis - Doppler ultrasound negative for DVT  Code status: Full  Family Communication: Discussed in detail with the patient, all imaging results, lab results explained to the patient    Disposition Plan: Not medically ready. Awaiting TIPS.  Time Spent in minutes  25 minutes  Procedures  thoracentesis  Consults   Dementia cardiology, gastroenterology  DVT Prophylaxis  Lovenox   Medications  Scheduled Meds: . enoxaparin (LOVENOX) injection  40 mg Subcutaneous Q24H  . folic acid  1 mg Oral Daily  . furosemide  60 mg Oral BID  . nadolol  10 mg Oral Daily  . pantoprazole  40 mg Oral Daily  . spironolactone  150 mg Oral BID  . thiamine  100 mg Oral Daily   Continuous Infusions:  PRN Meds:.alum & mag hydroxide-simeth, benzonatate, ondansetron, oxyCODONE, traMADol   Antibiotics   Anti-infectives    None  Subjective:   Savannah Jackson was seen and examined today. Denies any complaints, awaiting TIPS procedure. Still somewhat short of breath.  Patient denies dizziness, chest pain, abdominal pain, N/V/D/C, new weakness, numbess, tingling.   Objective:   Blood pressure 93/57, pulse 70, temperature 98.4 F (36.9 C), temperature source Oral, resp. rate 18, height 5\' 3"  (1.6 m), weight 66.9 kg (147 lb 7.8 oz), SpO2 99 %.  Wt Readings from Last 3 Encounters:  07/14/15 66.9 kg (147 lb 7.8 oz)  06/15/15 71.578 kg (157 lb 12.8 oz)  05/13/15 78.744 kg (173 lb 9.6 oz)     Intake/Output Summary (Last 24 hours) at 07/14/15 1318 Last data filed at  07/14/15 0947  Gross per 24 hour  Intake    480 ml  Output    700 ml  Net   -220 ml    Exam  General: Alert and oriented x 3, NAD  HEENT:  PERRLA, EOMI  Neck: Supple  CVS: S1 S2 clear, regular rate and rhythm  Respiratory: Diminished on the right  Abdomen: Soft, nontender, nondistended, + bowel sounds  Ext: no cyanosis clubbing, 1-2+ edema  Neuro: no new deficits   Skin: No rashes  Psych: Normal affect and demeanor, alert and oriented x3    Data Review   Micro Results No results found for this or any previous visit (from the past 240 hour(s)).  Radiology Reports Dg Chest 1 View  07/04/2015   CLINICAL DATA:  Status post right thoracentesis.  EXAM: CHEST  1 VIEW  COMPARISON:  07/03/2015  FINDINGS: There is a moderate right pleural effusion which has decreased in size compared with 07/03/2015. There is no right pneumothorax. There is no left pleural effusion. There is no left pneumothorax. There is no focal consolidation. The heart and mediastinal contours are stable.  The osseous structures are unremarkable.  IMPRESSION: Moderate right pleural effusion without a pneumothorax.   Electronically Signed   By: Kathreen Devoid   On: 07/04/2015 13:32   Dg Chest 1 View  06/16/2015   CLINICAL DATA:  Post thoracentesis  EXAM: CHEST  1 VIEW  COMPARISON:  05/19/2015  FINDINGS: Moderate right pleural effusion again noted with right lower lobe atelectasis. No pneumothorax following right thoracentesis. Left lung is clear. Heart is normal size. No acute bony abnormality.  IMPRESSION: Moderate right pleural effusion. No pneumothorax following thoracentesis.   Electronically Signed   By: Rolm Baptise M.D.   On: 06/16/2015 14:03   Dg Chest 2 View  07/07/2015   CLINICAL DATA:  History right-sided pleural effusion and recent thoracentesis with increasing shortness of Breath  EXAM: CHEST - 2 VIEW  COMPARISON:  07/04/2015  FINDINGS: There is been significant increase in the right-sided pleural effusion  with total opacification of the right hemi thorax. The left lung remains clear. The cardiac shadow is stable. The bony structures are unremarkable.  IMPRESSION: Increase in right-sided pleural effusion with opacification of the right hemi thorax.  These results will be called to the ordering clinician or representative by the Radiologist Assistant, and communication documented in the PACS or zVision Dashboard.   Electronically Signed   By: Inez Catalina M.D.   On: 07/07/2015 12:50   Dg Chest 2 View  07/03/2015   CLINICAL DATA:  Shortness of breath and cough for 1 week. Fever earlier in the week. Recent admission. Patient had fluid drawn from the right lung many times. Weakness.  EXAM: CHEST  2 VIEW  COMPARISON:  06/16/2015  FINDINGS: Moderate  right-sided pleural effusion, significantly increased since prior study. Mediastinal shift towards the left. Heart size is probably normal. Minimal left basilar subsegmental atelectasis. Surgical clips are present in the upper abdomen.  IMPRESSION: Reaccumulation of right pleural fluid.  Minimal left lower lobe atelectasis.   Electronically Signed   By: Nolon Nations M.D.   On: 07/03/2015 14:32   US Thoracentesis Asp Pleural Space W/img Guide  07/04/2015   INDICATION: Cirrhosis, dyspnea, prior history of breast cancer, recurrent right pleural effusion/ probable right hepatic hydrothorax ; request is made for diagnostic and therapeutic right thoracentesis.  EXAM: ULTRASOUND GUIDED DIAGNOSTIC AND THERAPEUTIC RIGHT THORACENTESIS  COMPARISON:  Prior thoracentesis on 06/16/2015  MEDICATIONS: None  COMPLICATIONS: None immediate  TECHNIQUE: Informed written consent was obtained from the patient after a discussion of the risks, benefits and alternatives to treatment. A timeout was performed prior to the initiation of the procedure.  Initial ultrasound scanning demonstrates a large right pleural effusion. The lower chest was prepped and draped in the usual sterile fashion. 1%  lidocaine was used for local anesthesia.  An ultrasound image was saved for documentation purposes. A 6 Fr Safe-T-Centesis catheter was introduced. The thoracentesis was performed. The catheter was removed and a dressing was applied. The patient tolerated the procedure well without immediate post procedural complication. The patient was escorted to have an upright chest radiograph.  FINDINGS: A total of approximately 2.2 liters of hazy, yellow fluid was removed. Requested samples were sent to the laboratory. Only the above amount of fluid was removed at this time secondary to increased patient coughing.  IMPRESSION: Successful ultrasound-guided diagnostic and therapeutic right sided thoracentesis yielding 2.2 liters of pleural fluid.  Read by: Rowe Robert, PA-C   Electronically Signed   By: Jacqulynn Cadet M.D.   On: 07/04/2015 13:05   US Thoracentesis Asp Pleural Space W/img Guide  06/16/2015   CLINICAL DATA:  Right pleural effusion  EXAM: ULTRASOUND GUIDED RIGHT THORACENTESIS  COMPARISON:  None.  PROCEDURE: An ultrasound guided thoracentesis was thoroughly discussed with the patient and questions answered. The benefits, risks, alternatives and complications were also discussed. The patient understands and wishes to proceed with the procedure. Written consent was obtained.  Ultrasound was performed to localize and mark an adequate pocket of fluid in the right chest. The area was then prepped and draped in the normal sterile fashion. 1% Lidocaine was used for local anesthesia. Under ultrasound guidance a 19 gauge Yueh catheter was introduced. Thoracentesis was performed. The catheter was removed and a dressing applied.  COMPLICATIONS: None.  FINDINGS: A total of approximately 1.9 L of clear yellow fluid was removed. A fluid sample was notsent for laboratory analysis.  IMPRESSION: Successful ultrasound guided right thoracentesis yielding 1.9 L of pleural fluid.   Electronically Signed   By: Rolm Baptise M.D.    On: 06/16/2015 14:39    CBC No results for input(s): WBC, HGB, HCT, PLT, MCV, MCH, MCHC, RDW, LYMPHSABS, MONOABS, EOSABS, BASOSABS, BANDABS in the last 168 hours.  Invalid input(s): NEUTRABS, BANDSABD  Chemistries   Recent Labs Lab 07/08/15 0811  07/09/15 0710 07/10/15 0441 07/12/15 0521 07/13/15 1005 07/14/15 0545  NA 137  < > 135 137 133* 137 135  K 5.5*  < > 4.8 5.3* 5.5* 5.2* 5.1  CL 110  < > 108 107 103 105 105  CO2 21*  < > 23 25 24 24 23   GLUCOSE 84  < > 95 102* 86 83 83  BUN 38*  < >  33* 32* 40* 40* 36*  CREATININE 1.50*  < > 1.39* 1.32* 1.60* 1.47* 1.34*  CALCIUM 8.1*  < > 8.0* 8.3* 8.3* 8.6* 8.4*  MG 1.9  --  1.8 1.8  --   --   --   AST 67*  --  61*  --   --   --  52*  ALT 23  --  25  --   --   --  22  ALKPHOS 144*  --  151*  --   --   --  138*  BILITOT 1.5*  --  1.4*  --   --   --  1.0  < > = values in this interval not displayed. ------------------------------------------------------------------------------------------------------------------ estimated creatinine clearance is 41.5 mL/min (by C-G formula based on Cr of 1.34). ------------------------------------------------------------------------------------------------------------------ No results for input(s): HGBA1C in the last 72 hours. ------------------------------------------------------------------------------------------------------------------ No results for input(s): CHOL, HDL, LDLCALC, TRIG, CHOLHDL, LDLDIRECT in the last 72 hours. ------------------------------------------------------------------------------------------------------------------ No results for input(s): TSH, T4TOTAL, T3FREE, THYROIDAB in the last 72 hours.  Invalid input(s): FREET3 ------------------------------------------------------------------------------------------------------------------ No results for input(s): VITAMINB12, FOLATE, FERRITIN, TIBC, IRON, RETICCTPCT in the last 72 hours.  Coagulation profile  Recent Labs Lab  07/07/15 1406 07/09/15 0710 07/14/15 0545  INR 1.54* 1.65* 1.45    No results for input(s): DDIMER in the last 72 hours.  Cardiac Enzymes No results for input(s): CKMB, TROPONINI, MYOGLOBIN in the last 168 hours.  Invalid input(s): CK ------------------------------------------------------------------------------------------------------------------ Invalid input(s): POCBNP  No results for input(s): GLUCAP in the last 72 hours.   Josee Speece M.D. [facility] 07/14/2015, 1:18 PM  Pager: [phone] Between 7am to 7pm - call Pager - [phone]  After 7pm go to [email or web address] - password [email or web address]  Call night coverage person covering after 7pm

## 2015-07-14 NOTE — Care Management Important Message (Signed)
Important Message  Patient Details  Name: Savannah Jackson MRN: [id number] Date of Birth: Jun 01, 1956   Medicare Important Message Given:  Yes-fourth notification given    Nathen May 07/14/2015, 11:55 AM

## 2015-07-14 NOTE — Progress Notes (Signed)
Patient ID: Savannah Jackson, female   DOB: June 30, 1956, 59 y.o.   MRN: [id number]   TIPs procedure scheduled in IR 9/16 1100 am  With anesthesia  IR PA will see pt  Prior to procedure Will place orders for same  Will inform MD and RN  And pt

## 2015-07-14 NOTE — Progress Notes (Signed)
Daily Rounding Note  07/14/2015, 11:21 AM  LOS: 11 days   SUBJECTIVE:       Her troubles breathing are intermittent.  Appetite depressed. No pain anywhere.  OBJECTIVE:         Vital signs in last 24 hours:    Temp:  [97.8 F (36.6 C)-98.4 F (36.9 C)] 98.4 F (36.9 C) (09/14 0620) Pulse Rate:  [55-70] 70 (09/14 0620) Resp:  [18] 18 (09/14 0620) BP: (93-103)/(57-65) 93/57 mmHg (09/14 0620) SpO2:  [97 %-100 %] 99 % (09/14 0620) Weight:  [147 lb 7.8 oz (66.9 kg)] 147 lb 7.8 oz (66.9 kg) (09/14 0500) Last BM Date: 07/13/15 Filed Weights   07/12/15 0614 07/12/15 2114 07/14/15 0500  Weight: 147 lb 14.9 oz (67.1 kg) 148 lb 13 oz (67.5 kg) 147 lb 7.8 oz (66.9 kg)   General: Pleasant, somewhat frail but looks well.   Heart: RRR. Chest: Breath sounds on right are pretty much absent. Reduced breath sounds on the left. Voice is raspy, no dyspnea currently. Abdomen: Soft, not distended, nontender. Bowel sounds active.  Extremities: No CCE. Neuro/Psych:  Pleasant, little bit depressed, cooperative, calm.  Intake/Output from previous day: 09/13 0701 - 09/14 0700 In: 240 [P.O.:240] Out: 1351 [Urine:1350; Stool:1]  Intake/Output this shift: Total I/O In: 240 [P.O.:240] Out: -   Lab Results: No results for input(s): WBC, HGB, HCT, PLT in the last 72 hours. BMET  Recent Labs  07/12/15 0521 07/13/15 1005 07/14/15 0545  NA 133* 137 135  K 5.5* 5.2* 5.1  CL 103 105 105  CO2 24 24 23   GLUCOSE 86 83 83  BUN 40* 40* 36*  CREATININE 1.60* 1.47* 1.34*  CALCIUM 8.3* 8.6* 8.4*   LFT  Recent Labs  07/14/15 0545  PROT 5.1*  ALBUMIN 1.6*  AST 52*  ALT 22  ALKPHOS 138*  BILITOT 1.0   PT/INR  Recent Labs  07/14/15 0545  LABPROT 17.8*  INR 1.45   Hepatitis Panel No results for input(s): HEPBSAG, HCVAB, HEPAIGM, HEPBIGM in the last 72 hours.  Studies/Results: No results found.  ASSESMENT:   * Hepatic  hydrothorax Lasix, aldactone reinitiated last week, doses progressively increased. aldactone dose decreased yesterday due to hyperkalemia. On Nadolol. 2 gm NA diet.  Last thoracentesis 2.2 liters: 9/4 Last CXR 9/7 with increasing right effusion.  Overall her weight is down a total of 10 pounds in the last 11 days but is up 3 pounds from her low weight of 144 on 07/10/15 TIPS set for Friday 9/16  * Renal insufficiency. Likely effect of diuretics. BUN, creatinine improved today. Yesterday's diarrhetic doses were Aldactone 150 mg and furosemide 120 mg.  Today's doses will be Aldactone 300, furosemide 120.  * Hyperkalemia. Resolved.  * Coagulopathy. Progressed, however improved this morning after vitamin K for the past 2 days, today is day 3 of vitamin K.    PLAN   *  IR set TIPS up for 9/16 at 11 AM. *  Continue diuretics of Aldactone 300 mg daily and Lasix 120 mg daily.  Follow daily bmet.    Azucena Freed  07/14/2015, 11:21 AM Pager: [phone]  Attending Addendum: I have taken an interval history and examined the patient. I agree with the Advanced Practitioner's note and impression. Not much improvement on higher dose diuretics. Plans for TIPS on Friday. Continue diuretics in the interim and monitor renal function.   Milford Mill Cellar, MD [facility] Pager [phone]

## 2015-07-15 ENCOUNTER — Encounter (HOSPITAL_COMMUNITY): Payer: Self-pay | Admitting: Student

## 2015-07-15 LAB — BASIC METABOLIC PANEL
Anion gap: 6 (ref 5–15)
BUN: 29 mg/dL — ABNORMAL HIGH (ref 6–20)
CHLORIDE: 104 mmol/L (ref 101–111)
CO2: 26 mmol/L (ref 22–32)
CREATININE: 1.23 mg/dL — AB (ref 0.44–1.00)
Calcium: 8.5 mg/dL — ABNORMAL LOW (ref 8.9–10.3)
GFR, EST AFRICAN AMERICAN: 55 mL/min — AB (ref >60)
GFR, EST NON AFRICAN AMERICAN: 47 mL/min — AB (ref >60)
Glucose, Bld: 86 mg/dL (ref 65–99)
POTASSIUM: 4.7 mmol/L (ref 3.5–5.1)
SODIUM: 136 mmol/L (ref 135–145)

## 2015-07-15 MED ORDER — ENOXAPARIN SODIUM 40 MG/0.4ML ~~LOC~~ SOLN
40.0000 mg | SUBCUTANEOUS | Status: DC
  Administered 2015-07-17: 40 mg via SUBCUTANEOUS
  Filled 2015-07-15 (×3): qty 0.4

## 2015-07-15 NOTE — Progress Notes (Signed)
[facility]                                                                              Patient Demographics  Savannah Jackson, is a 59 y.o. female, DOB - [birth year], DI:[id number]  Admit date - 07/03/2015   Admitting Physician Florencia Reasons, MD  Outpatient Primary MD for the patient is [facility], MD  LOS - 12   Chief Complaint  Patient presents with  . Shortness of Breath       Brief HPI   Per Dr. Nehemiah Settle on 9/3 Savannah Jackson is a 59 y.o. female with a history of alcoholic cirrhosis, hepatomegaly, diverticulosis, right pleural effusion status post for centesis every month 3 over the past 3 months. The patient presented with increasing shortness of breath which began 2-3 days after her last thoracentesis. Her symptoms worsened over the past few weeks before admission and worse when ambulating and improved at rest. In the emergency department, nasal cannula was applied which provided oxygen saturation mid 90s, however this worsens when ambulating and her oxygen level drops to high 80s. The patient was admitted for further workup.  Assessment & Plan    Principal Problem: Recurrent hepatic hydrothorax:  - Patient has been having weekly thoracentesis since June 2016. Interventional radiology was consulted, patient underwent thoracentesis on 9/4, cultures so far negative. - Patient was placed on Lasix and spironolactone, O2 - Echo showed grade 1 diastolic dysfunction, otherwise unremarkable. - Gastroenterology was also consulted and did not recommend repeat thoracentesis, recommended to monitor with diuresis - TIPS procedure scheduled for 9/16 at 11 AM, NPO after mid night  Active problems Alcohol cirrhosis/ascites: With history of portal hypertension and varices, prior GI bleed - No prior history of SBP. Currently stable, no encephalopathy. - Reported stopped drinking 4 months ago.  Hyperkalemia -resolved   Hypotension with history of hypertension -BP  currently stable  Home medications including lisinopril and Lopressor were discontinued  Continue spironolactone and Lasix.  Nadolol was started on 9/9 per GI recommendations as tolerated with BP.  Hypoxia/chronic DOE/chronic respiratory insufficiency due to recurrent pleural effusion: due to recurrent refractory pleural effusion - Patient being successfully weaned   Lower extremity edema: likely from cirrhosis - Doppler ultrasound negative for DVT  Code status: Full  Family Communication: Discussed in detail with the patient, all imaging results, lab results explained to the patient    Disposition Plan: Not medically ready. Awaiting TIPS.  Time Spent in minutes  25 minutes  Procedures  thoracentesis  Consults   Dementia cardiology, gastroenterology  DVT Prophylaxis  Lovenox   Medications  Scheduled Meds: . [START ON 07/16/2015] enoxaparin (LOVENOX) injection  40 mg Subcutaneous Q24H  . folic acid  1 mg Oral Daily  . furosemide  60 mg Oral BID  . nadolol  10 mg Oral Daily  . pantoprazole  40 mg Oral Daily  . spironolactone  150 mg Oral BID  . thiamine  100 mg Oral Daily   Continuous Infusions:  PRN Meds:.alum & mag hydroxide-simeth, benzonatate, ondansetron, oxyCODONE, traMADol   Antibiotics   Anti-infectives    None  Subjective:   Savannah Jackson was seen and examined today. Denies any complaints, awaiting TIPS procedure tomorrow. Afebrile. Still somewhat short of breath.  Patient denies dizziness, chest pain, abdominal pain, N/V/D/C, new weakness, numbess, tingling.   Objective:   Blood pressure 111/74, pulse 50, temperature 98 F (36.7 C), temperature source Oral, resp. rate 18, height 5\' 3"  (1.6 m), weight 65.4 kg (144 lb 2.9 oz), SpO2 92 %.  Wt Readings from Last 3 Encounters:  07/15/15 65.4 kg (144 lb 2.9 oz)  06/15/15 71.578 kg (157 lb 12.8 oz)  05/13/15 78.744 kg (173 lb 9.6 oz)     Intake/Output Summary (Last 24 hours) at 07/15/15  1435 Last data filed at 07/15/15 0900  Gross per 24 hour  Intake    120 ml  Output   1150 ml  Net  -1030 ml    Exam  General: Alert and oriented x 3, NAD  HEENT:  PERRLA, EOMI  Neck: Supple  CVS: S1 S2 clear, RRR  Respiratory: Diminished on the right  Abdomen: Soft, nontender, nondistended, + bowel sounds  Ext: no cyanosis clubbing, 1-2+ edema  Neuro: no new deficits   Skin: No rashes  Psych: Alert and oriented    Data Review   Micro Results No results found for this or any previous visit (from the past 240 hour(s)).  Radiology Reports Dg Chest 1 View  07/04/2015   CLINICAL DATA:  Status post right thoracentesis.  EXAM: CHEST  1 VIEW  COMPARISON:  07/03/2015  FINDINGS: There is a moderate right pleural effusion which has decreased in size compared with 07/03/2015. There is no right pneumothorax. There is no left pleural effusion. There is no left pneumothorax. There is no focal consolidation. The heart and mediastinal contours are stable.  The osseous structures are unremarkable.  IMPRESSION: Moderate right pleural effusion without a pneumothorax.   Electronically Signed   By: Kathreen Devoid   On: 07/04/2015 13:32   Dg Chest 1 View  06/16/2015   CLINICAL DATA:  Post thoracentesis  EXAM: CHEST  1 VIEW  COMPARISON:  05/19/2015  FINDINGS: Moderate right pleural effusion again noted with right lower lobe atelectasis. No pneumothorax following right thoracentesis. Left lung is clear. Heart is normal size. No acute bony abnormality.  IMPRESSION: Moderate right pleural effusion. No pneumothorax following thoracentesis.   Electronically Signed   By: Rolm Baptise M.D.   On: 06/16/2015 14:03   Dg Chest 2 View  07/07/2015   CLINICAL DATA:  History right-sided pleural effusion and recent thoracentesis with increasing shortness of Breath  EXAM: CHEST - 2 VIEW  COMPARISON:  07/04/2015  FINDINGS: There is been significant increase in the right-sided pleural effusion with total opacification  of the right hemi thorax. The left lung remains clear. The cardiac shadow is stable. The bony structures are unremarkable.  IMPRESSION: Increase in right-sided pleural effusion with opacification of the right hemi thorax.  These results will be called to the ordering clinician or representative by the Radiologist Assistant, and communication documented in the PACS or zVision Dashboard.   Electronically Signed   By: Inez Catalina M.D.   On: 07/07/2015 12:50   Dg Chest 2 View  07/03/2015   CLINICAL DATA:  Shortness of breath and cough for 1 week. Fever earlier in the week. Recent admission. Patient had fluid drawn from the right lung many times. Weakness.  EXAM: CHEST  2 VIEW  COMPARISON:  06/16/2015  FINDINGS: Moderate right-sided pleural effusion, significantly increased since prior study.  Mediastinal shift towards the left. Heart size is probably normal. Minimal left basilar subsegmental atelectasis. Surgical clips are present in the upper abdomen.  IMPRESSION: Reaccumulation of right pleural fluid.  Minimal left lower lobe atelectasis.   Electronically Signed   By: Nolon Nations M.D.   On: 07/03/2015 14:32   US Thoracentesis Asp Pleural Space W/img Guide  07/04/2015   INDICATION: Cirrhosis, dyspnea, prior history of breast cancer, recurrent right pleural effusion/ probable right hepatic hydrothorax ; request is made for diagnostic and therapeutic right thoracentesis.  EXAM: ULTRASOUND GUIDED DIAGNOSTIC AND THERAPEUTIC RIGHT THORACENTESIS  COMPARISON:  Prior thoracentesis on 06/16/2015  MEDICATIONS: None  COMPLICATIONS: None immediate  TECHNIQUE: Informed written consent was obtained from the patient after a discussion of the risks, benefits and alternatives to treatment. A timeout was performed prior to the initiation of the procedure.  Initial ultrasound scanning demonstrates a large right pleural effusion. The lower chest was prepped and draped in the usual sterile fashion. 1% lidocaine was used for local  anesthesia.  An ultrasound image was saved for documentation purposes. A 6 Fr Safe-T-Centesis catheter was introduced. The thoracentesis was performed. The catheter was removed and a dressing was applied. The patient tolerated the procedure well without immediate post procedural complication. The patient was escorted to have an upright chest radiograph.  FINDINGS: A total of approximately 2.2 liters of hazy, yellow fluid was removed. Requested samples were sent to the laboratory. Only the above amount of fluid was removed at this time secondary to increased patient coughing.  IMPRESSION: Successful ultrasound-guided diagnostic and therapeutic right sided thoracentesis yielding 2.2 liters of pleural fluid.  Read by: Rowe Robert, PA-C   Electronically Signed   By: Jacqulynn Cadet M.D.   On: 07/04/2015 13:05   US Thoracentesis Asp Pleural Space W/img Guide  06/16/2015   CLINICAL DATA:  Right pleural effusion  EXAM: ULTRASOUND GUIDED RIGHT THORACENTESIS  COMPARISON:  None.  PROCEDURE: An ultrasound guided thoracentesis was thoroughly discussed with the patient and questions answered. The benefits, risks, alternatives and complications were also discussed. The patient understands and wishes to proceed with the procedure. Written consent was obtained.  Ultrasound was performed to localize and mark an adequate pocket of fluid in the right chest. The area was then prepped and draped in the normal sterile fashion. 1% Lidocaine was used for local anesthesia. Under ultrasound guidance a 19 gauge Yueh catheter was introduced. Thoracentesis was performed. The catheter was removed and a dressing applied.  COMPLICATIONS: None.  FINDINGS: A total of approximately 1.9 L of clear yellow fluid was removed. A fluid sample was notsent for laboratory analysis.  IMPRESSION: Successful ultrasound guided right thoracentesis yielding 1.9 L of pleural fluid.   Electronically Signed   By: Rolm Baptise M.D.   On: 06/16/2015 14:39     CBC No results for input(s): WBC, HGB, HCT, PLT, MCV, MCH, MCHC, RDW, LYMPHSABS, MONOABS, EOSABS, BASOSABS, BANDABS in the last 168 hours.  Invalid input(s): NEUTRABS, BANDSABD  Chemistries   Recent Labs Lab 07/09/15 0710 07/10/15 0441 07/12/15 0521 07/13/15 1005 07/14/15 0545 07/15/15 0510  NA 135 137 133* 137 135 136  K 4.8 5.3* 5.5* 5.2* 5.1 4.7  CL 108 107 103 105 105 104  CO2 23 25 24 24 23 26   GLUCOSE 95 102* 86 83 83 86  BUN 33* 32* 40* 40* 36* 29*  CREATININE 1.39* 1.32* 1.60* 1.47* 1.34* 1.23*  CALCIUM 8.0* 8.3* 8.3* 8.6* 8.4* 8.5*  MG 1.8 1.8  --   --   --   --  AST 61*  --   --   --  52*  --   ALT 25  --   --   --  22  --   ALKPHOS 151*  --   --   --  138*  --   BILITOT 1.4*  --   --   --  1.0  --    ------------------------------------------------------------------------------------------------------------------ estimated creatinine clearance is 44.8 mL/min (by C-G formula based on Cr of 1.23). ------------------------------------------------------------------------------------------------------------------ No results for input(s): HGBA1C in the last 72 hours. ------------------------------------------------------------------------------------------------------------------ No results for input(s): CHOL, HDL, LDLCALC, TRIG, CHOLHDL, LDLDIRECT in the last 72 hours. ------------------------------------------------------------------------------------------------------------------ No results for input(s): TSH, T4TOTAL, T3FREE, THYROIDAB in the last 72 hours.  Invalid input(s): FREET3 ------------------------------------------------------------------------------------------------------------------ No results for input(s): VITAMINB12, FOLATE, FERRITIN, TIBC, IRON, RETICCTPCT in the last 72 hours.  Coagulation profile  Recent Labs Lab 07/09/15 0710 07/14/15 0545  INR 1.65* 1.45    No results for input(s): DDIMER in the last 72 hours.  Cardiac  Enzymes No results for input(s): CKMB, TROPONINI, MYOGLOBIN in the last 168 hours.  Invalid input(s): CK ------------------------------------------------------------------------------------------------------------------ Invalid input(s): POCBNP  No results for input(s): GLUCAP in the last 72 hours.   RAI,RIPUDEEP M.D. [facility] 07/15/2015, 2:35 PM  Pager: [phone] Between 7am to 7pm - call Pager - [phone]  After 7pm go to [email or web address] - password [email or web address]  Call night coverage person covering after 7pm

## 2015-07-15 NOTE — Progress Notes (Signed)
History of Present Illness: Savannah Jackson is a 59 y.o. female IR has been following in preparation for TIPS procedure. Pt seen by Dr. Laurence Ferrari on 9/8 and had long discussion regarding procedure, including risks, complication, and post procedure expectations. She is now stable for procedure and we are scheduled for TIPS tomorrow at 1100.  Pt resting in bed, no c/o this am. Denies SOB or abd distention.  Past Medical History  Diagnosis Date  . Hypertension   . Brain aneurysm   . Rotator cuff syndrome of left shoulder   . DDD (degenerative disc disease), lumbar   . Chronic back pain Diverticultis  . Cirrhosis     alcoholic  . Sleep apnea   . ETOH abuse   . Bilateral leg edema   . Hepatomegaly     scope 2014  . Diverticulosis     scope 2014  . Cancer     breast cancer    Past Surgical History  Procedure Laterality Date  . Cholecystectomy    . Brain surgery    . Breast surgery    . Total knee arthroplasty      right. 2002  . Cataract extraction Bilateral     [facility] 2 or 3 years ago  . Colonoscopy N/A 04/10/2013    Procedure: COLONOSCOPY;  Surgeon: Rogene Houston, MD;  Location: [facility];  Service: Endoscopy;  Laterality: N/A;  1030-rescheduled to 1200 Ann notified pt  . Esophagogastroduodenoscopy N/A 04/22/2015    Procedure: ESOPHAGOGASTRODUODENOSCOPY (EGD);  Surgeon: Rogene Houston, MD;  Location: [facility];  Service: Endoscopy;  Laterality: N/A;    Allergies: Latex and Vicodin  Medications:  Current facility-administered medications:  .  alum & mag hydroxide-simeth (MAALOX/MYLANTA) 200-200-20 MG/5ML suspension 30 mL, 30 mL, Oral, Q6H PRN, Tanna Savoy Stinson, DO .  benzonatate (TESSALON) capsule 100 mg, 100 mg, Oral, TID PRN, Florencia Reasons, MD, 100 mg at 07/06/15 2314 .  enoxaparin (LOVENOX) injection 40 mg, 40 mg, Subcutaneous, Q24H, Charlynne Cousins Gribbin, PA-C, 40 mg at 07/14/15 1749 .  folic acid (FOLVITE) tablet 1 mg, 1 mg, Oral, Daily, Tanna Savoy Stinson, DO, 1  mg at 07/15/15 [id number] .  furosemide (LASIX) tablet 60 mg, 60 mg, Oral, BID, Milus Banister, MD, 60 mg at 07/15/15 0742 .  nadolol (CORGARD) tablet 10 mg, 10 mg, Oral, Daily, Florencia Reasons, MD, 10 mg at 07/14/15 1026 .  ondansetron [facility]) tablet 4 mg, 4 mg, Oral, Q8H PRN, Tanna Savoy Stinson, DO, 4 mg at 07/10/15 2146 .  oxyCODONE (Oxy IR/ROXICODONE) immediate release tablet 5 mg, 5 mg, Oral, TID PRN, Tanna Savoy Stinson, DO, 5 mg at 07/11/15 2045 .  pantoprazole (PROTONIX) EC tablet 40 mg, 40 mg, Oral, Daily, Tanna Savoy Stinson, DO, 40 mg at 07/15/15 0743 .  spironolactone (ALDACTONE) tablet 150 mg, 150 mg, Oral, BID, Vena Rua, PA-C, 150 mg at 07/14/15 1748 .  thiamine (VITAMIN B-1) tablet 100 mg, 100 mg, Oral, Daily, Tanna Savoy Stinson, DO, 100 mg at 07/15/15 [id number] .  traMADol (ULTRAM) tablet 50 mg, 50 mg, Oral, Q8H PRN, Dianne Dun, NP, 50 mg at 07/15/15 0415    History reviewed. No pertinent family history.  Social History   Social History  . Marital Status: Married    Spouse Name: N/A  . Number of Children: N/A  . Years of Education: N/A   Social History Main Topics  . Smoking status: Never Smoker   . Smokeless tobacco: Never  Used  . Alcohol Use: 0.0 oz/week    0 Standard drinks or equivalent per week     Comment: none since 03/2015  . Drug Use: No  . Sexual Activity: Yes    Birth Control/ Protection: None   Other Topics Concern  . 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 However he is fairly capable of taking care of himself at home. This couple have an attentive daughter who keeps an eye on both of them.     Review of Systems: A 12 point ROS discussed and pertinent positives are indicated in the HPI above.  All other systems are negative.  Review of Systems  Vital Signs: BP 113/66 mmHg  Pulse 69  Temp(Src) 97.7 F (36.5 C) (Oral)  Resp 30  Ht 5\' 3"  (1.6 m)  Wt 144 lb 2.9 oz (65.4  kg)  BMI 25.55 kg/m2  SpO2 95%  Physical Exam  Constitutional: She is oriented to person, place, and time. She appears well-developed and well-nourished. No distress.  HENT:  Head: Normocephalic.  Mouth/Throat: Oropharynx is clear and moist.  Neck: Normal range of motion. No JVD present. No tracheal deviation present.  Cardiovascular: Normal rate, regular rhythm and normal heart sounds.   Pulmonary/Chest: Effort normal. No respiratory distress.  Diminished right basilar BS  Abdominal: Soft. She exhibits distension. There is no tenderness.  Neurological: She is alert and oriented to person, place, and time.  Psychiatric: She has a normal mood and affect. Judgment normal.    Mallampati Score:  MD Evaluation Airway: WNL Heart: WNL Abdomen: WNL Chest/ Lungs: WNL ASA  Classification: 3 Mallampati/Airway Score: Two  Imaging: Dg Chest 1 View  07/04/2015   CLINICAL DATA:  Status post right thoracentesis.  EXAM: CHEST  1 VIEW  COMPARISON:  07/03/2015  FINDINGS: There is a moderate right pleural effusion which has decreased in size compared with 07/03/2015. There is no right pneumothorax. There is no left pleural effusion. There is no left pneumothorax. There is no focal consolidation. The heart and mediastinal contours are stable.  The osseous structures are unremarkable.  IMPRESSION: Moderate right pleural effusion without a pneumothorax.   Electronically Signed   By: Kathreen Devoid   On: 07/04/2015 13:32   Dg Chest 1 View  06/16/2015   CLINICAL DATA:  Post thoracentesis  EXAM: CHEST  1 VIEW  COMPARISON:  05/19/2015  FINDINGS: Moderate right pleural effusion again noted with right lower lobe atelectasis. No pneumothorax following right thoracentesis. Left lung is clear. Heart is normal size. No acute bony abnormality.  IMPRESSION: Moderate right pleural effusion. No pneumothorax following thoracentesis.   Electronically Signed   By: Rolm Baptise M.D.   On: 06/16/2015 14:03   Dg Chest 2  View  07/07/2015   CLINICAL DATA:  History right-sided pleural effusion and recent thoracentesis with increasing shortness of Breath  EXAM: CHEST - 2 VIEW  COMPARISON:  07/04/2015  FINDINGS: There is been significant increase in the right-sided pleural effusion with total opacification of the right hemi thorax. The left lung remains clear. The cardiac shadow is stable. The bony structures are unremarkable.  IMPRESSION: Increase in right-sided pleural effusion with opacification of the right hemi thorax.  These results will be called to the ordering clinician or representative by the Radiologist Assistant, and communication documented in the PACS or zVision Dashboard.   Electronically Signed   By: Linus Mako.D.  On: 07/07/2015 12:50   Dg Chest 2 View  07/03/2015   CLINICAL DATA:  Shortness of breath and cough for 1 week. Fever earlier in the week. Recent admission. Patient had fluid drawn from the right lung many times. Weakness.  EXAM: CHEST  2 VIEW  COMPARISON:  06/16/2015  FINDINGS: Moderate right-sided pleural effusion, significantly increased since prior study. Mediastinal shift towards the left. Heart size is probably normal. Minimal left basilar subsegmental atelectasis. Surgical clips are present in the upper abdomen.  IMPRESSION: Reaccumulation of right pleural fluid.  Minimal left lower lobe atelectasis.   Electronically Signed   By: Nolon Nations M.D.   On: 07/03/2015 14:32   US Thoracentesis Asp Pleural Space W/img Guide  07/04/2015   INDICATION: Cirrhosis, dyspnea, prior history of breast cancer, recurrent right pleural effusion/ probable right hepatic hydrothorax ; request is made for diagnostic and therapeutic right thoracentesis.  EXAM: ULTRASOUND GUIDED DIAGNOSTIC AND THERAPEUTIC RIGHT THORACENTESIS  COMPARISON:  Prior thoracentesis on 06/16/2015  MEDICATIONS: None  COMPLICATIONS: None immediate  TECHNIQUE: Informed written consent was obtained from the patient after a discussion of the  risks, benefits and alternatives to treatment. A timeout was performed prior to the initiation of the procedure.  Initial ultrasound scanning demonstrates a large right pleural effusion. The lower chest was prepped and draped in the usual sterile fashion. 1% lidocaine was used for local anesthesia.  An ultrasound image was saved for documentation purposes. A 6 Fr Safe-T-Centesis catheter was introduced. The thoracentesis was performed. The catheter was removed and a dressing was applied. The patient tolerated the procedure well without immediate post procedural complication. The patient was escorted to have an upright chest radiograph.  FINDINGS: A total of approximately 2.2 liters of hazy, yellow fluid was removed. Requested samples were sent to the laboratory. Only the above amount of fluid was removed at this time secondary to increased patient coughing.  IMPRESSION: Successful ultrasound-guided diagnostic and therapeutic right sided thoracentesis yielding 2.2 liters of pleural fluid.  Read by: Rowe Robert, PA-C   Electronically Signed   By: Jacqulynn Cadet M.D.   On: 07/04/2015 13:05   US Thoracentesis Asp Pleural Space W/img Guide  06/16/2015   CLINICAL DATA:  Right pleural effusion  EXAM: ULTRASOUND GUIDED RIGHT THORACENTESIS  COMPARISON:  None.  PROCEDURE: An ultrasound guided thoracentesis was thoroughly discussed with the patient and questions answered. The benefits, risks, alternatives and complications were also discussed. The patient understands and wishes to proceed with the procedure. Written consent was obtained.  Ultrasound was performed to localize and mark an adequate pocket of fluid in the right chest. The area was then prepped and draped in the normal sterile fashion. 1% Lidocaine was used for local anesthesia. Under ultrasound guidance a 19 gauge Yueh catheter was introduced. Thoracentesis was performed. The catheter was removed and a dressing applied.  COMPLICATIONS: None.  FINDINGS: A  total of approximately 1.9 L of clear yellow fluid was removed. A fluid sample was notsent for laboratory analysis.  IMPRESSION: Successful ultrasound guided right thoracentesis yielding 1.9 L of pleural fluid.   Electronically Signed   By: Rolm Baptise M.D.   On: 06/16/2015 14:39    Labs:  CBC:  Recent Labs  07/03/15 1330 07/04/15 0853 07/05/15 0510 07/06/15 0636  WBC 11.3* 10.7* 10.2 10.1  HGB 10.8* 9.8* 9.6* 11.3*  HCT 31.9* 31.0* 29.2* 33.0*  PLT 200 178 164 127*    COAGS:  Recent Labs  07/03/15 1330 07/07/15 1406 07/09/15 0710 07/14/15  0545  INR 1.59* 1.54* 1.65* 1.45    BMP:  Recent Labs  07/12/15 0521 07/13/15 1005 07/14/15 0545 07/15/15 0510  NA 133* 137 135 136  K 5.5* 5.2* 5.1 4.7  CL 103 105 105 104  CO2 24 24 23 26   GLUCOSE 86 83 83 86  BUN 40* 40* 36* 29*  CALCIUM 8.3* 8.6* 8.4* 8.5*  CREATININE 1.60* 1.47* 1.34* 1.23*  GFRNONAA 34* 38* 42* 47*  GFRAA 40* 44* 49* 55*    LIVER FUNCTION TESTS:  Recent Labs  07/06/15 0636 07/08/15 0811 07/09/15 0710 07/14/15 0545  BILITOT 1.2 1.5* 1.4* 1.0  AST 70* 67* 61* 52*  ALT 26 23 25 22   ALKPHOS 158* 144* 151* 138*  PROT 5.4* 5.3* 5.3* 5.1*  ALBUMIN 1.8* 1.7* 1.7* 1.6*    Assessment and Plan: Alcoholic cirrhosis with recurrent ascites and right hydrothorax. Has not had CXR since 9/7 Labs about as optimal as can be for procedure TIPS planned for tomorrow at 1100 with Dr. Laurence Ferrari Hold Lovenox tonight. NPO p MN Will check CXR and labs in am Procedure, risks, complications, and post operative care, expectations, follow up all discussed at length again. Consent signed in chart Pt says some family will be here with her tomorrow.  SignedAscencion Dike 07/15/2015, 9:56 AM   I spent a total of 20 minutes in face to face in clinical consultation, greater than 50% of which was counseling/coordinating care for TIPS procedure

## 2015-07-15 NOTE — Progress Notes (Signed)
Daily Rounding Note  07/15/2015, 11:53 AM  LOS: 12 days   SUBJECTIVE:       Oxygen sats to 95%. Repeat chest films ordered today. Intermittently short of breath. Appetite remains diminished.  OBJECTIVE:         Vital signs in last 24 hours:    Temp:  [97.7 F (36.5 C)-98.4 F (36.9 C)] 97.7 F (36.5 C) (09/15 0534) Pulse Rate:  [60-69] 69 (09/15 0534) Resp:  [16-48] 30 (09/14 2239) BP: (98-113)/(57-66) 113/66 mmHg (09/15 0534) SpO2:  [95 %-100 %] 95 % (09/15 0534) Weight:  [144 lb 2.9 oz (65.4 kg)-146 lb 2.6 oz (66.3 kg)] 144 lb 2.9 oz (65.4 kg) (09/15 0534) Last BM Date: 07/15/15 Filed Weights   07/14/15 0500 07/15/15 0416 07/15/15 0534  Weight: 147 lb 7.8 oz (66.9 kg) 146 lb 2.6 oz (66.3 kg) 144 lb 2.9 oz (65.4 kg)   General: Looks tired and somewhat chronically ill. Alert.   Heart: RRR. Chest: Absent breath sounds on the right. Slightly dyspneic with speech but not at rest. Abdomen: Soft, not tender, not distended. Bowel sounds active.  Extremities: No CCE. Neuro/Psych:  Calm, pleasant, cooperative. Oriented 3. No gross deficits.  Intake/Output from previous day: 09/14 0701 - 09/15 0700 In: 360 [P.O.:360] Out: 900 [Urine:900]  Intake/Output this shift: Total I/O In: 120 [P.O.:120] Out: 250 [Urine:250]  Lab Results: No results for input(s): WBC, HGB, HCT, PLT in the last 72 hours. BMET  Recent Labs  07/13/15 1005 07/14/15 0545 07/15/15 0510  NA 137 135 136  K 5.2* 5.1 4.7  CL 105 105 104  CO2 24 23 26   GLUCOSE 83 83 86  BUN 40* 36* 29*  CREATININE 1.47* 1.34* 1.23*  CALCIUM 8.6* 8.4* 8.5*   LFT  Recent Labs  07/14/15 0545  PROT 5.1*  ALBUMIN 1.6*  AST 52*  ALT 22  ALKPHOS 138*  BILITOT 1.0   PT/INR  Recent Labs  07/14/15 0545  LABPROT 17.8*  INR 1.45   Hepatitis Panel No results for input(s): HEPBSAG, HCVAB, HEPAIGM, HEPBIGM in the last 72 hours.  Studies/Results: No  results found.  Scheduled Meds: . [START ON 07/16/2015] enoxaparin (LOVENOX) injection  40 mg Subcutaneous Q24H  . folic acid  1 mg Oral Daily  . furosemide  60 mg Oral BID  . nadolol  10 mg Oral Daily  . pantoprazole  40 mg Oral Daily  . spironolactone  150 mg Oral BID  . thiamine  100 mg Oral Daily   Continuous Infusions:  PRN Meds:.alum & mag hydroxide-simeth, benzonatate, ondansetron, oxyCODONE, traMADol  ASSESMENT:     * Hepatic hydrothorax Lasix, aldactone reinitiated last week, doses progressively increased. aldactone dose decreased yesterday due to hyperkalemia. On Nadolol. 2 gm NA diet.  Last thoracentesis 2.2 liters: 9/4 Last CXR 9/7 with increasing right effusion.  Overall weight is down a total of 10 pounds in the last 11 days, down 3 # in last 3 daysTIPS set for Friday 9/16.  * Renal insufficiency. Likely effect of diuretics. BUN, creatinine improved today. On aldactone 300, lasix 120 daily.   * Coagulopathy. Improved after 3 days po Vitamin K.     PLAN   *  TIPS 11 AM tomorrow. After procedure the doses of her diuretics will likely be reduced.  *  At discharge she will need to follow-up with her primary GI physician who is Dr. Laural Golden in [place].     Savannah Jackson  07/15/2015, 11:53 AM Pager: [phone]  Attending Addendum: . I agree with the Advanced Practitioner's note and impression. Patient tolerating diuretics and renal function stable but not making much progress with the effusion and her oxygen requirement. Scheduled for TIPS tomorrow, will await response. Continue diuretics for now.   Dighton Cellar, MD [facility] Pager [phone]

## 2015-07-16 ENCOUNTER — Inpatient Hospital Stay (HOSPITAL_COMMUNITY): Payer: Commercial Managed Care - HMO

## 2015-07-16 ENCOUNTER — Inpatient Hospital Stay (HOSPITAL_COMMUNITY): Payer: Commercial Managed Care - HMO | Admitting: Anesthesiology

## 2015-07-16 ENCOUNTER — Encounter (HOSPITAL_COMMUNITY)
Admission: EM | Disposition: A | Discharge status: Home / Self Care | Payer: Self-pay | Source: Home / Self Care | Attending: Internal Medicine

## 2015-07-16 HISTORY — PX: RADIOLOGY WITH ANESTHESIA: SHX6223

## 2015-07-16 LAB — COMPREHENSIVE METABOLIC PANEL
ALBUMIN: 1.7 g/dL — AB (ref 3.5–5.0)
ALT: 24 U/L (ref 14–54)
ANION GAP: 6 (ref 5–15)
AST: 51 U/L — ABNORMAL HIGH (ref 15–41)
Alkaline Phosphatase: 137 U/L — ABNORMAL HIGH (ref 38–126)
BUN: 27 mg/dL — ABNORMAL HIGH (ref 6–20)
CHLORIDE: 107 mmol/L (ref 101–111)
CO2: 25 mmol/L (ref 22–32)
Calcium: 8.5 mg/dL — ABNORMAL LOW (ref 8.9–10.3)
Creatinine, Ser: 1.5 mg/dL — ABNORMAL HIGH (ref 0.44–1.00)
GFR calc non Af Amer: 37 mL/min — ABNORMAL LOW (ref >60)
GFR, EST AFRICAN AMERICAN: 43 mL/min — AB (ref >60)
Glucose, Bld: 87 mg/dL (ref 65–99)
Potassium: 4.8 mmol/L (ref 3.5–5.1)
SODIUM: 138 mmol/L (ref 135–145)
Total Bilirubin: 0.9 mg/dL (ref 0.3–1.2)
Total Protein: 5.7 g/dL — ABNORMAL LOW (ref 6.5–8.1)

## 2015-07-16 LAB — CBC
HEMATOCRIT: 32.7 % — AB (ref 36.0–46.0)
HEMOGLOBIN: 10.7 g/dL — AB (ref 12.0–15.0)
MCH: 26.2 pg (ref 26.0–34.0)
MCHC: 32.7 g/dL (ref 30.0–36.0)
MCV: 80 fL (ref 78.0–100.0)
PLATELETS: 251 10*3/uL (ref 150–400)
RBC: 4.09 MIL/uL (ref 3.87–5.11)
RDW: 15.1 % (ref 11.5–15.5)
WBC: 9 10*3/uL (ref 4.0–10.5)

## 2015-07-16 LAB — ABO/RH: ABO/RH(D): O POS

## 2015-07-16 LAB — PREPARE RBC (CROSSMATCH)

## 2015-07-16 SURGERY — RADIOLOGY WITH ANESTHESIA
Anesthesia: General

## 2015-07-16 MED ORDER — LACTATED RINGERS IV SOLN
INTRAVENOUS | Status: DC | PRN
  Administered 2015-07-16 (×2): via INTRAVENOUS

## 2015-07-16 MED ORDER — LACTATED RINGERS IV SOLN
INTRAVENOUS | Status: DC
  Administered 2015-07-16: 11:00:00 via INTRAVENOUS

## 2015-07-16 MED ORDER — MIDAZOLAM HCL 2 MG/2ML IJ SOLN
INTRAMUSCULAR | Status: AC
  Filled 2015-07-16: qty 4

## 2015-07-16 MED ORDER — ROCURONIUM BROMIDE 100 MG/10ML IV SOLN
INTRAVENOUS | Status: DC | PRN
  Administered 2015-07-16: 30 mg via INTRAVENOUS

## 2015-07-16 MED ORDER — ALBUMIN HUMAN 5 % IV SOLN
INTRAVENOUS | Status: DC | PRN
  Administered 2015-07-16: 17:00:00 via INTRAVENOUS

## 2015-07-16 MED ORDER — LACTULOSE 10 GM/15ML PO SOLN
20.0000 g | Freq: Three times a day (TID) | ORAL | Status: DC
  Administered 2015-07-16 – 2015-07-17 (×3): 20 g via ORAL
  Filled 2015-07-16 (×4): qty 30

## 2015-07-16 MED ORDER — NEOSTIGMINE METHYLSULFATE 10 MG/10ML IV SOLN
INTRAVENOUS | Status: DC | PRN
  Administered 2015-07-16: 5 mg via INTRAVENOUS

## 2015-07-16 MED ORDER — ONDANSETRON HCL 4 MG/2ML IJ SOLN
INTRAMUSCULAR | Status: DC | PRN
  Administered 2015-07-16: 4 mg via INTRAVENOUS

## 2015-07-16 MED ORDER — SODIUM CHLORIDE 0.9 % IV SOLN
Freq: Two times a day (BID) | INTRAVENOUS | Status: DC
  Administered 2015-07-17: 11:00:00 via INTRAVENOUS

## 2015-07-16 MED ORDER — FENTANYL CITRATE (PF) 100 MCG/2ML IJ SOLN
INTRAMUSCULAR | Status: DC | PRN
  Administered 2015-07-16 (×2): 50 ug via INTRAVENOUS

## 2015-07-16 MED ORDER — FENTANYL CITRATE (PF) 250 MCG/5ML IJ SOLN
INTRAMUSCULAR | Status: AC
  Filled 2015-07-16: qty 5

## 2015-07-16 MED ORDER — LACTATED RINGERS IV SOLN
INTRAVENOUS | Status: DC | PRN
Start: 2015-07-16 — End: 2015-07-16
  Administered 2015-07-16 (×2): via INTRAVENOUS

## 2015-07-16 MED ORDER — OXYCODONE HCL 5 MG PO TABS
ORAL_TABLET | ORAL | Status: AC
  Administered 2015-07-16: 5 mg via ORAL
  Filled 2015-07-16: qty 1

## 2015-07-16 MED ORDER — PROPOFOL 10 MG/ML IV BOLUS
INTRAVENOUS | Status: AC
  Filled 2015-07-16: qty 20

## 2015-07-16 MED ORDER — GLYCOPYRROLATE 0.2 MG/ML IJ SOLN
INTRAMUSCULAR | Status: DC | PRN
  Administered 2015-07-16: 0.6 mg via INTRAVENOUS

## 2015-07-16 MED ORDER — SODIUM CHLORIDE 0.9 % IV BOLUS (SEPSIS)
250.0000 mL | Freq: Once | INTRAVENOUS | Status: AC
  Administered 2015-07-16: 250 mL via INTRAVENOUS

## 2015-07-16 MED ORDER — FENTANYL CITRATE (PF) 100 MCG/2ML IJ SOLN
25.0000 ug | INTRAMUSCULAR | Status: DC | PRN
  Administered 2015-07-16 (×5): 25 ug via INTRAVENOUS

## 2015-07-16 MED ORDER — SODIUM CHLORIDE 0.9 % IV SOLN: 10.0000 mL/h | Freq: Once | INTRAVENOUS | Status: DC

## 2015-07-16 MED ORDER — IOHEXOL 300 MG/ML  SOLN
150.0000 mL | Freq: Once | INTRAMUSCULAR | Status: DC | PRN
  Administered 2015-07-16: 50 mL via INTRAVENOUS
  Filled 2015-07-16: qty 150

## 2015-07-16 MED ORDER — PROMETHAZINE HCL 25 MG/ML IJ SOLN
6.2500 mg | INTRAMUSCULAR | Status: DC | PRN
  Administered 2015-07-16: 6.25 mg via INTRAVENOUS

## 2015-07-16 MED ORDER — GELATIN ABSORBABLE 12-7 MM EX MISC
CUTANEOUS | Status: AC
Start: 2015-07-16 — End: 2015-07-17
  Filled 2015-07-16: qty 2

## 2015-07-16 MED ORDER — FENTANYL CITRATE (PF) 100 MCG/2ML IJ SOLN
INTRAMUSCULAR | Status: AC
  Administered 2015-07-16: 25 ug via INTRAVENOUS
  Filled 2015-07-16: qty 2

## 2015-07-16 MED ORDER — FUROSEMIDE 40 MG PO TABS
40.0000 mg | ORAL_TABLET | Freq: Two times a day (BID) | ORAL | Status: DC
  Administered 2015-07-16 – 2015-07-17 (×2): 40 mg via ORAL
  Filled 2015-07-16 (×2): qty 1

## 2015-07-16 MED ORDER — PROMETHAZINE HCL 25 MG/ML IJ SOLN
INTRAMUSCULAR | Status: AC
  Administered 2015-07-16: 6.25 mg via INTRAVENOUS
  Filled 2015-07-16: qty 1

## 2015-07-16 MED ORDER — PROPOFOL 10 MG/ML IV BOLUS
INTRAVENOUS | Status: DC | PRN
  Administered 2015-07-16: 80 mg via INTRAVENOUS

## 2015-07-16 MED ORDER — SPIRONOLACTONE 100 MG PO TABS
100.0000 mg | ORAL_TABLET | Freq: Two times a day (BID) | ORAL | Status: DC
  Administered 2015-07-16 – 2015-07-17 (×2): 100 mg via ORAL
  Filled 2015-07-16 (×2): qty 1
  Filled 2015-07-16: qty 4
  Filled 2015-07-16 (×3): qty 1

## 2015-07-16 NOTE — Transfer of Care (Signed)
Immediate Anesthesia Transfer of Care Note  Patient: Savannah Jackson  Procedure(s) Performed: Procedure(s): TIPS (N/A)  Patient Location: PACU  Anesthesia Type:General  Level of Consciousness: awake, alert , oriented and patient cooperative  Airway & Oxygen Therapy: Patient Spontanous Breathing and Patient connected to nasal cannula oxygen  Post-op Assessment: Report given to RN, Post -op Vital signs reviewed and stable and Patient moving all extremities  Post vital signs: Reviewed and stable  Last Vitals:  Filed Vitals:   07/16/15 0934  BP: 90/58  Pulse: 55  Temp:   Resp: 20    Complications: No apparent anesthesia complications

## 2015-07-16 NOTE — Anesthesia Procedure Notes (Signed)
Procedure Name: Intubation Date/Time: 07/16/2015 3:31 PM Performed by: Izora Gala Pre-anesthesia Checklist: Patient identified, Emergency Drugs available, Suction available and Patient being monitored Patient Re-evaluated:Patient Re-evaluated prior to inductionOxygen Delivery Method: Circle system utilized Preoxygenation: Pre-oxygenation with 100% oxygen Intubation Type: IV induction Ventilation: Mask ventilation without difficulty Laryngoscope Size: Miller and 3 Grade View: Grade I Tube type: Oral Tube size: 7.5 mm Number of attempts: 1 Airway Equipment and Method: Stylet Placement Confirmation: ETT inserted through vocal cords under direct vision,  positive ETCO2 and CO2 detector Secured at: 22 cm Tube secured with: Tape Dental Injury: Teeth and Oropharynx as per pre-operative assessment

## 2015-07-16 NOTE — Anesthesia Postprocedure Evaluation (Signed)
  Anesthesia Post-op Note  Patient: Savannah Jackson  Procedure(s) Performed: Procedure(s) (LRB): TIPS (N/A)  Patient Location: PACU  Anesthesia Type: General  Level of Consciousness: awake and alert   Airway and Oxygen Therapy: Patient Spontanous Breathing  Post-op Pain: mild  Post-op Assessment: Post-op Vital signs reviewed, Patient's Cardiovascular Status Stable, Respiratory Function Stable, Patent Airway and No signs of Nausea or vomiting  Last Vitals:  Filed Vitals:   07/16/15 1830  BP: 139/61  Pulse:   Temp:   Resp:     Post-op Vital Signs: stable   Complications: No apparent anesthesia complications

## 2015-07-16 NOTE — Anesthesia Preprocedure Evaluation (Addendum)
Anesthesia Evaluation  Patient identified by MRN, date of birth, ID band Patient awake    Reviewed: Allergy & Precautions, NPO status , Patient's Chart, lab work & pertinent test results  History of Anesthesia Complications Negative for: history of anesthetic complications  Airway Mallampati: II  TM Distance: >3 FB     Dental  (+) Teeth Intact   Pulmonary shortness of breath,  Pleural effusion requiring thorocentesis   breath sounds clear to auscultation       Cardiovascular hypertension, Pt. 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  Ascites  Peds  Hematology  (+) anemia ,   Anesthesia Other Findings   Reproductive/Obstetrics                            Anesthesia Physical Anesthesia Plan  ASA: IV  Anesthesia Plan: General   Post-op Pain Management:    Induction: Intravenous  Airway Management Planned: Oral ETT  Additional Equipment: Arterial line  Intra-op Plan:   Post-operative Plan: Possible Post-op intubation/ventilation  Informed Consent: I have reviewed the patients History and Physical, chart, labs and discussed the procedure including the risks, benefits and alternatives for the proposed anesthesia with the patient or authorized representative who has indicated his/her understanding and acceptance.   Dental advisory given  Plan Discussed with: CRNA, Anesthesiologist and Surgeon  Anesthesia Plan Comments:         Anesthesia Quick Evaluation

## 2015-07-16 NOTE — Procedures (Signed)
Interventional Radiology Procedure Note  Procedure: 1.) Right thoracentesis with aspiration of 2.5L pleural fluid 2.) Transhepatic portal venous access 3.) Creation of right hepatic vein to right portal vein TIPS  Complications: None  Estimated Blood Loss: 0  Recommendations: - Bedrest tonight - Clears tonight, regular diet in am - Begin lactulose tonight.  Titrate to 2-3 soft stools daily  Signed,  Criselda Peaches, MD

## 2015-07-16 NOTE — Progress Notes (Signed)
[facility]                                                                              Patient Demographics  Savannah Jackson, is a 59 y.o. female, DOB - 1956-08-11, [id number]  Admit date - 07/03/2015   Admitting Physician Florencia Reasons, MD  Outpatient Primary MD for the patient is [facility], MD  LOS - 13   Chief Complaint  Patient presents with  . Shortness of Breath       Brief HPI   Per Dr. Nehemiah Settle on 9/3 Savannah Jackson is a 59 y.o. female with a history of alcoholic cirrhosis, hepatomegaly, diverticulosis, right pleural effusion status post for centesis every month 3 over the past 3 months. The patient presented with increasing shortness of breath which began 2-3 days after her last thoracentesis. Her symptoms worsened over the past few weeks before admission and worse when ambulating and improved at rest. In the emergency department, nasal cannula was applied which provided oxygen saturation mid 90s, however this worsens when ambulating and her oxygen level drops to high 80s. The patient was admitted for further workup.  Assessment & Plan    Principal Problem: Recurrent hepatic hydrothorax:  - Patient has been having weekly thoracentesis since June 2016. Interventional radiology was consulted, patient underwent thoracentesis on 9/4, cultures so far negative. - Patient was placed on Lasix and spironolactone, O2 - Echo showed grade 1 diastolic dysfunction, otherwise unremarkable. - Gastroenterology was also consulted and did not recommend repeat thoracentesis, recommended to monitor with diuresis - TIPS procedure scheduled for today  Active problems Alcohol cirrhosis/ascites: With history of portal hypertension and varices, prior GI bleed - No prior history of SBP. Currently stable, no encephalopathy. - Reported stopped drinking 4 months ago.  Hyperkalemia -resolved   Acute kidney injury - Likely due to diuretics, will hold evening dose of  Lasix and Aldactone. Likely diuretics will need to be titrated down after the TIPS procedure, will await gastroenterology recommendations  Hypotension with history of hypertension -BP currently stable  Home medications including lisinopril and Lopressor were discontinued  Continue spironolactone and Lasix.  Nadolol was started on 9/9 per GI recommendations as tolerated with BP.  Hypoxia/chronic DOE/chronic respiratory insufficiency due to recurrent pleural effusion: due to recurrent refractory pleural effusion - Patient being successfully weaned sats 99% on 1 L  Lower extremity edema: likely from cirrhosis - Doppler ultrasound negative for DVT  Code status: Full  Family Communication: Discussed in detail with the patient, all imaging results, lab results explained to the patient    Disposition Plan: Not medically ready. Awaiting TIPS.  Time Spent in minutes  25 minutes  Procedures  thoracentesis  Consults   IR  gastroenterology  DVT Prophylaxis  Lovenox   Medications  Scheduled Meds: . [MAR Hold] sodium chloride   Intravenous Q12H  . sodium chloride  10 mL/hr Intravenous Once  . [MAR Hold] enoxaparin (LOVENOX) injection  40 mg Subcutaneous Q24H  . [MAR Hold] folic acid  1 mg Oral Daily  . [MAR Hold] furosemide  60 mg Oral BID  . [MAR Hold] nadolol  10 mg Oral Daily  . [  MAR Hold] pantoprazole  40 mg Oral Daily  . [MAR Hold] spironolactone  150 mg Oral BID  . [MAR Hold] thiamine  100 mg Oral Daily   Continuous Infusions: . lactated ringers 50 mL/hr at 07/16/15 1049   PRN Meds:.[MAR Hold] alum & mag hydroxide-simeth, [MAR Hold] benzonatate, [MAR Hold] ondansetron, [MAR Hold] oxyCODONE, [MAR Hold] traMADol   Antibiotics   Anti-infectives    None        Subjective:   Talona Thresher was seen and examined today. No complaints, sitting up in the chair, awaiting TIPS procedure today. No significant shortness of breath. Patient denies dizziness, chest pain,  abdominal pain, N/V/D/C, new weakness, numbess, tingling.   Objective:   Blood pressure 90/58, pulse 55, temperature 97.9 F (36.6 C), temperature source Oral, resp. rate 20, height 5\' 3"  (1.6 m), weight 65.5 kg (144 lb 6.4 oz), SpO2 99 %.  Wt Readings from Last 3 Encounters:  07/16/15 65.5 kg (144 lb 6.4 oz)  06/15/15 71.578 kg (157 lb 12.8 oz)  05/13/15 78.744 kg (173 lb 9.6 oz)     Intake/Output Summary (Last 24 hours) at 07/16/15 1136 Last data filed at 07/16/15 0914  Gross per 24 hour  Intake     30 ml  Output    900 ml  Net   -870 ml    Exam  General: Alert and oriented x 3, NAD  HEENT:  PERRLA, EOMI  Neck: Supple  CVS: S1 S2 clear, RRR  Respiratory: Diminished on the right  Abdomen: Soft, nontender, nondistended, + bowel sounds  Ext: no cyanosis clubbing, trace edema on the left,  Neuro: no new deficits   Skin: No rashes  Psych: Alert and oriented    Data Review   Micro Results No results found for this or any previous visit (from the past 240 hour(s)).  Radiology Reports Dg Chest 1 View  07/04/2015   CLINICAL DATA:  Status post right thoracentesis.  EXAM: CHEST  1 VIEW  COMPARISON:  07/03/2015  FINDINGS: There is a moderate right pleural effusion which has decreased in size compared with 07/03/2015. There is no right pneumothorax. There is no left pleural effusion. There is no left pneumothorax. There is no focal consolidation. The heart and mediastinal contours are stable.  The osseous structures are unremarkable.  IMPRESSION: Moderate right pleural effusion without a pneumothorax.   Electronically Signed   By: Kathreen Devoid   On: 07/04/2015 13:32   Dg Chest 1 View  06/16/2015   CLINICAL DATA:  Post thoracentesis  EXAM: CHEST  1 VIEW  COMPARISON:  05/19/2015  FINDINGS: Moderate right pleural effusion again noted with right lower lobe atelectasis. No pneumothorax following right thoracentesis. Left lung is clear. Heart is normal size. No acute bony  abnormality.  IMPRESSION: Moderate right pleural effusion. No pneumothorax following thoracentesis.   Electronically Signed   By: Rolm Baptise M.D.   On: 06/16/2015 14:03   Dg Chest 2 View  07/16/2015   CLINICAL DATA:  Chest soreness.  Follow-up pleural effusion.  EXAM: CHEST  2 VIEW  COMPARISON:  07/07/2015  FINDINGS: Complete opacification of the right hemithorax. Left lung is clear. Heart is normal size. No acute bony abnormality.  IMPRESSION: Stable complete opacification of the right hemithorax.   Electronically Signed   By: Rolm Baptise M.D.   On: 07/16/2015 08:06   Dg Chest 2 View  07/07/2015   CLINICAL DATA:  History right-sided pleural effusion and recent thoracentesis with increasing shortness of Breath  EXAM: CHEST - 2 VIEW  COMPARISON:  07/04/2015  FINDINGS: There is been significant increase in the right-sided pleural effusion with total opacification of the right hemi thorax. The left lung remains clear. The cardiac shadow is stable. The bony structures are unremarkable.  IMPRESSION: Increase in right-sided pleural effusion with opacification of the right hemi thorax.  These results will be called to the ordering clinician or representative by the Radiologist Assistant, and communication documented in the PACS or zVision Dashboard.   Electronically Signed   By: Inez Catalina M.D.   On: 07/07/2015 12:50   Dg Chest 2 View  07/03/2015   CLINICAL DATA:  Shortness of breath and cough for 1 week. Fever earlier in the week. Recent admission. Patient had fluid drawn from the right lung many times. Weakness.  EXAM: CHEST  2 VIEW  COMPARISON:  06/16/2015  FINDINGS: Moderate right-sided pleural effusion, significantly increased since prior study. Mediastinal shift towards the left. Heart size is probably normal. Minimal left basilar subsegmental atelectasis. Surgical clips are present in the upper abdomen.  IMPRESSION: Reaccumulation of right pleural fluid.  Minimal left lower lobe atelectasis.    Electronically Signed   By: Nolon Nations M.D.   On: 07/03/2015 14:32   US Thoracentesis Asp Pleural Space W/img Guide  07/04/2015   INDICATION: Cirrhosis, dyspnea, prior history of breast cancer, recurrent right pleural effusion/ probable right hepatic hydrothorax ; request is made for diagnostic and therapeutic right thoracentesis.  EXAM: ULTRASOUND GUIDED DIAGNOSTIC AND THERAPEUTIC RIGHT THORACENTESIS  COMPARISON:  Prior thoracentesis on 06/16/2015  MEDICATIONS: None  COMPLICATIONS: None immediate  TECHNIQUE: Informed written consent was obtained from the patient after a discussion of the risks, benefits and alternatives to treatment. A timeout was performed prior to the initiation of the procedure.  Initial ultrasound scanning demonstrates a large right pleural effusion. The lower chest was prepped and draped in the usual sterile fashion. 1% lidocaine was used for local anesthesia.  An ultrasound image was saved for documentation purposes. A 6 Fr Safe-T-Centesis catheter was introduced. The thoracentesis was performed. The catheter was removed and a dressing was applied. The patient tolerated the procedure well without immediate post procedural complication. The patient was escorted to have an upright chest radiograph.  FINDINGS: A total of approximately 2.2 liters of hazy, yellow fluid was removed. Requested samples were sent to the laboratory. Only the above amount of fluid was removed at this time secondary to increased patient coughing.  IMPRESSION: Successful ultrasound-guided diagnostic and therapeutic right sided thoracentesis yielding 2.2 liters of pleural fluid.  Read by: Rowe Robert, PA-C   Electronically Signed   By: Jacqulynn Cadet M.D.   On: 07/04/2015 13:05   US Thoracentesis Asp Pleural Space W/img Guide  06/16/2015   CLINICAL DATA:  Right pleural effusion  EXAM: ULTRASOUND GUIDED RIGHT THORACENTESIS  COMPARISON:  None.  PROCEDURE: An ultrasound guided thoracentesis was thoroughly  discussed with the patient and questions answered. The benefits, risks, alternatives and complications were also discussed. The patient understands and wishes to proceed with the procedure. Written consent was obtained.  Ultrasound was performed to localize and mark an adequate pocket of fluid in the right chest. The area was then prepped and draped in the normal sterile fashion. 1% Lidocaine was used for local anesthesia. Under ultrasound guidance a 19 gauge Yueh catheter was introduced. Thoracentesis was performed. The catheter was removed and a dressing applied.  COMPLICATIONS: None.  FINDINGS: A total of approximately 1.9 L of clear yellow fluid  was removed. A fluid sample was notsent for laboratory analysis.  IMPRESSION: Successful ultrasound guided right thoracentesis yielding 1.9 L of pleural fluid.   Electronically Signed   By: Rolm Baptise M.D.   On: 06/16/2015 14:39    CBC  Recent Labs Lab 07/16/15 0724  WBC 9.0  HGB 10.7*  HCT 32.7*  PLT 251  MCV 80.0  MCH 26.2  MCHC 32.7  RDW 15.1    Chemistries   Recent Labs Lab 07/10/15 0441 07/12/15 0521 07/13/15 1005 07/14/15 0545 07/15/15 0510 07/16/15 0724  NA 137 133* 137 135 136 138  K 5.3* 5.5* 5.2* 5.1 4.7 4.8  CL 107 103 105 105 104 107  CO2 25 24 24 23 26 25   GLUCOSE 102* 86 83 83 86 87  BUN 32* 40* 40* 36* 29* 27*  CREATININE 1.32* 1.60* 1.47* 1.34* 1.23* 1.50*  CALCIUM 8.3* 8.3* 8.6* 8.4* 8.5* 8.5*  MG 1.8  --   --   --   --   --   AST  --   --   --  52*  --  51*  ALT  --   --   --  22  --  24  ALKPHOS  --   --   --  138*  --  137*  BILITOT  --   --   --  1.0  --  0.9   ------------------------------------------------------------------------------------------------------------------ estimated creatinine clearance is 36.7 mL/min (by C-G formula based on Cr of 1.5). ------------------------------------------------------------------------------------------------------------------ No results for input(s): HGBA1C in  the last 72 hours. ------------------------------------------------------------------------------------------------------------------ No results for input(s): CHOL, HDL, LDLCALC, TRIG, CHOLHDL, LDLDIRECT in the last 72 hours. ------------------------------------------------------------------------------------------------------------------ No results for input(s): TSH, T4TOTAL, T3FREE, THYROIDAB in the last 72 hours.  Invalid input(s): FREET3 ------------------------------------------------------------------------------------------------------------------ No results for input(s): VITAMINB12, FOLATE, FERRITIN, TIBC, IRON, RETICCTPCT in the last 72 hours.  Coagulation profile  Recent Labs Lab 07/14/15 0545  INR 1.45    No results for input(s): DDIMER in the last 72 hours.  Cardiac Enzymes No results for input(s): CKMB, TROPONINI, MYOGLOBIN in the last 168 hours.  Invalid input(s): CK ------------------------------------------------------------------------------------------------------------------ Invalid input(s): POCBNP  No results for input(s): GLUCAP in the last 72 hours.   RAI,RIPUDEEP M.D. [facility] 07/16/2015, 11:36 AM  Pager: [phone] Between 7am to 7pm - call Pager - [phone]  After 7pm go to [email or web address] - password [email or web address]  Call night coverage person covering after 7pm

## 2015-07-17 ENCOUNTER — Inpatient Hospital Stay (HOSPITAL_COMMUNITY): Payer: Commercial Managed Care - HMO

## 2015-07-17 DIAGNOSIS — R188 Other ascites: Secondary | ICD-10-CM

## 2015-07-17 LAB — BASIC METABOLIC PANEL
ANION GAP: 8 (ref 5–15)
BUN: 28 mg/dL — ABNORMAL HIGH (ref 6–20)
CHLORIDE: 106 mmol/L (ref 101–111)
CO2: 23 mmol/L (ref 22–32)
Calcium: 8.3 mg/dL — ABNORMAL LOW (ref 8.9–10.3)
Creatinine, Ser: 1.57 mg/dL — ABNORMAL HIGH (ref 0.44–1.00)
GFR calc Af Amer: 41 mL/min — ABNORMAL LOW (ref >60)
GFR calc non Af Amer: 35 mL/min — ABNORMAL LOW (ref >60)
GLUCOSE: 99 mg/dL (ref 65–99)
POTASSIUM: 5.1 mmol/L (ref 3.5–5.1)
Sodium: 137 mmol/L (ref 135–145)

## 2015-07-17 LAB — CBC
HEMATOCRIT: 33.1 % — AB (ref 36.0–46.0)
HEMOGLOBIN: 10.6 g/dL — AB (ref 12.0–15.0)
MCH: 25.8 pg — AB (ref 26.0–34.0)
MCHC: 32 g/dL (ref 30.0–36.0)
MCV: 80.5 fL (ref 78.0–100.0)
Platelets: 171 10*3/uL (ref 150–400)
RBC: 4.11 MIL/uL (ref 3.87–5.11)
RDW: 15 % (ref 11.5–15.5)
WBC: 16.8 10*3/uL — ABNORMAL HIGH (ref 4.0–10.5)

## 2015-07-17 MED ORDER — FUROSEMIDE 40 MG PO TABS
40.0000 mg | ORAL_TABLET | Freq: Every day | ORAL | Status: DC
  Administered 2015-07-18: 40 mg via ORAL
  Filled 2015-07-17: qty 1

## 2015-07-17 MED ORDER — SPIRONOLACTONE 25 MG PO TABS
100.0000 mg | ORAL_TABLET | Freq: Every day | ORAL | Status: DC
  Administered 2015-07-18: 100 mg via ORAL
  Filled 2015-07-17: qty 4

## 2015-07-17 NOTE — Progress Notes (Signed)
[facility]                                                                              Patient Demographics  Savannah Jackson, is a 59 y.o. female, DOB - Oct 19, 1956, [id number]  Admit date - 07/03/2015   Admitting Physician Florencia Reasons, MD  Outpatient Primary MD for the patient is [facility], MD  LOS - 14   Chief Complaint  Patient presents with  . Shortness of Breath       Brief HPI   Per Dr. Nehemiah Settle on 9/3 Savannah Jackson is a 58 y.o. female with a history of alcoholic cirrhosis, hepatomegaly, diverticulosis, right pleural effusion status post for centesis every month 3 over the past 3 months. The patient presented with increasing shortness of breath which began 2-3 days after her last thoracentesis. Her symptoms worsened over the past few weeks before admission and worse when ambulating and improved at rest. In the emergency department, nasal cannula was applied which provided oxygen saturation mid 90s, however this worsens when ambulating and her oxygen level drops to high 80s. The patient was admitted for further workup.  Assessment & Plan    Principal Problem: Recurrent hepatic hydrothorax:  - Patient has been having weekly thoracentesis since June 2016. Interventional radiology was consulted, patient underwent thoracentesis on 9/4, cultures so far negative. - Patient was placed on Lasix and spironolactone, O2 - Echo showed grade 1 diastolic dysfunction, otherwise unremarkable. - Gastroenterology was also consulted and did not recommend repeat thoracentesis, recommended to monitor with diuresis - TIPS procedure done 9/16 - per IR, stable. Per GI recommendations, since Cr 1.65, I havedecreased lasix to 40mg  daily and aldactone to 100mg  daily. Continue lactulose for goal BM 2-3/day  Active problems Alcohol cirrhosis/ascites: With history of portal hypertension and varices, prior GI bleed - No prior history of SBP. Currently stable, no  encephalopathy. - Reported stopped drinking 4 months ago.  Hyperkalemia - Cr 5.1. I have decreased aldactone to 100mg  daily   Acute kidney injury - Likely due to diuretics,dec Lasix and Aldactone.   Hypotension with history of hypertension -BP currently stable  Home medications including lisinopril and Lopressor were discontinued  Continue spironolactone and Lasix.  Nadolol was started on 9/9 per GI recommendations as tolerated with BP.  Hypoxia/chronic DOE/chronic respiratory insufficiency due to recurrent pleural effusion: due to recurrent refractory pleural effusion - Patient being successfully weaned sats 99% on 1 L  Lower extremity edema: likely from cirrhosis - Doppler ultrasound negative for DVT  Code status: Full  Family Communication: Discussed in detail with the patient, all imaging results, lab results explained to the patient    Disposition Plan: likely DC in am per GI recommendations  Time Spent in minutes  25 minutes  Procedures  thoracentesis  Consults   IR  gastroenterology  DVT Prophylaxis  Lovenox   Medications  Scheduled Meds: . sodium chloride   Intravenous Q12H  . sodium chloride  10 mL/hr Intravenous Once  . enoxaparin (LOVENOX) injection  40 mg Subcutaneous Q24H  . folic acid  1 mg Oral Daily  . furosemide  40 mg Oral BID  .  lactulose  20 g Oral TID  . nadolol  10 mg Oral Daily  . pantoprazole  40 mg Oral Daily  . spironolactone  100 mg Oral BID  . thiamine  100 mg Oral Daily   Continuous Infusions: . lactated ringers 50 mL/hr at 07/16/15 1049   PRN Meds:.alum & mag hydroxide-simeth, benzonatate, iohexol, ondansetron, oxyCODONE, traMADol   Antibiotics   Anti-infectives    None        Subjective:   Savannah Jackson was seen and examined today. No complaints,SOB improving.   Afebrile. Patient denies dizziness, chest pain, abdominal pain, N/V/D/C, new weakness, numbess, tingling.   Objective:   Blood pressure 92/56, pulse 68,  temperature 97.8 F (36.6 C), temperature source Oral, resp. rate 16, height 5\' 3"  (1.6 m), weight 64.6 kg (142 lb 6.7 oz), SpO2 97 %.  Wt Readings from Last 3 Encounters:  07/17/15 64.6 kg (142 lb 6.7 oz)  06/15/15 71.578 kg (157 lb 12.8 oz)  05/13/15 78.744 kg (173 lb 9.6 oz)     Intake/Output Summary (Last 24 hours) at 07/17/15 1404 Last data filed at 07/17/15 1129  Gross per 24 hour  Intake   2620 ml  Output    750 ml  Net   1870 ml    Exam  General: Alert and oriented x 3, NAD  HEENT:  PERRLA, EOMI  Neck: Supple  CVS: S1 S2 clear, RRR  Respiratory: CTAB  Abdomen: Soft, NT, ND, NBS  Ext: no cyanosis clubbing, trace edema on the left,  Neuro: no new deficits   Skin: No rashes  Psych: Alert and oriented    Data Review   Micro Results No results found for this or any previous visit (from the past 240 hour(s)).  Radiology Reports Dg Chest 1 View  07/16/2015   CLINICAL DATA:  59 year old female with right-sided pleural effusion status post right thoracentesis.  EXAM: CHEST  1 VIEW  COMPARISON:  An earlier Radiograph dated 07/16/2015  FINDINGS: There has been interval decrease in the size of the right all pleural effusion. Aerated lung tissue is now seen in the right hemithorax. There is diffuse increased interstitial and airspace opacity throughout the right lung. The left lung is clear. Stable cardiac silhouette. No pneumothorax. There is degenerative changes of the spine and shoulder. No acute fracture.  IMPRESSION: Significant interval reduction in size of the right pleural effusion with aeration of the right lung.  Diffuse right lung interstitial and airspace opacity likely represents a combination of atelectasis/ edema. Pneumonia is not excluded. Follow-up recommended.  No pneumothorax.   Electronically Signed   By: Anner Crete M.D.   On: 07/16/2015 19:08   Dg Chest 1 View  07/04/2015   CLINICAL DATA:  Status post right thoracentesis.  EXAM: CHEST  1 VIEW   COMPARISON:  07/03/2015  FINDINGS: There is a moderate right pleural effusion which has decreased in size compared with 07/03/2015. There is no right pneumothorax. There is no left pleural effusion. There is no left pneumothorax. There is no focal consolidation. The heart and mediastinal contours are stable.  The osseous structures are unremarkable.  IMPRESSION: Moderate right pleural effusion without a pneumothorax.   Electronically Signed   By: Kathreen Devoid   On: 07/04/2015 13:32   Dg Chest 2 View  07/17/2015   CLINICAL DATA:  Right pleural effusion.  EXAM: CHEST  2 VIEW  COMPARISON:  07/16/2015  FINDINGS: The left lung is clear. There is a small right pleural effusion. There  is right lung volume loss most compatible with atelectasis. There is mild bilateral interstitial thickening, right greater than left. There is no pneumothorax. The heart and mediastinal contours are unremarkable.  There are surgical clips in the right axilla.  The osseous structures are unremarkable.  IMPRESSION: 1. Trace right pleural effusion. Right lung volume loss concerning for atelectasis. Mild bilateral interstitial prominence, right greater than left.   Electronically Signed   By: Kathreen Devoid   On: 07/17/2015 09:14   Dg Chest 2 View  07/16/2015   CLINICAL DATA:  Chest soreness.  Follow-up pleural effusion.  EXAM: CHEST  2 VIEW  COMPARISON:  07/07/2015  FINDINGS: Complete opacification of the right hemithorax. Left lung is clear. Heart is normal size. No acute bony abnormality.  IMPRESSION: Stable complete opacification of the right hemithorax.   Electronically Signed   By: Rolm Baptise M.D.   On: 07/16/2015 08:06   Dg Chest 2 View  07/07/2015   CLINICAL DATA:  History right-sided pleural effusion and recent thoracentesis with increasing shortness of Breath  EXAM: CHEST - 2 VIEW  COMPARISON:  07/04/2015  FINDINGS: There is been significant increase in the right-sided pleural effusion with total opacification of the right hemi  thorax. The left lung remains clear. The cardiac shadow is stable. The bony structures are unremarkable.  IMPRESSION: Increase in right-sided pleural effusion with opacification of the right hemi thorax.  These results will be called to the ordering clinician or representative by the Radiologist Assistant, and communication documented in the PACS or zVision Dashboard.   Electronically Signed   By: Inez Catalina M.D.   On: 07/07/2015 12:50   Dg Chest 2 View  07/03/2015   CLINICAL DATA:  Shortness of breath and cough for 1 week. Fever earlier in the week. Recent admission. Patient had fluid drawn from the right lung many times. Weakness.  EXAM: CHEST  2 VIEW  COMPARISON:  06/16/2015  FINDINGS: Moderate right-sided pleural effusion, significantly increased since prior study. Mediastinal shift towards the left. Heart size is probably normal. Minimal left basilar subsegmental atelectasis. Surgical clips are present in the upper abdomen.  IMPRESSION: Reaccumulation of right pleural fluid.  Minimal left lower lobe atelectasis.   Electronically Signed   By: Nolon Nations M.D.   On: 07/03/2015 14:32   Ir Tips  07/16/2015   CLINICAL DATA:  59 year old female with alcoholic cirrhosis and medically refractory hepatic hydrothorax.  EXAM: TRANSJUGULAR INTRAHEPATIC PORTOSYSTEMIC SHUNT; IR THORACENTESIS ASP PLEURAL SPACE W/IMG GUIDE  Date: 07/16/2015  PROCEDURE: 1. Ultrasound-guided right thoracentesis 2. Ultrasound-guided transhepatic portal venous access 3. Ultrasound-guided puncture right internal jugular vein 4. Catheterization of the right hepatic vein 5. Right hepatic venogram 6. Portal venogram 7. Successful transhepatic puncture of the right portal vein from a right hepatic venous approach 8. Additional portal venography and pressure measurements 9. TIPS creation 10. Post TIPS portal venography and pressure measurements Interventional Radiologist:  Criselda Peaches, MD  ANESTHESIA/SEDATION: General anesthesia  provided by the anesthesiology service.  MEDICATIONS: None additional  FLUOROSCOPY TIME:  12 minutes 36 seconds  673.3 mGy  CONTRAST:  10mL OMNIPAQUE IOHEXOL 300 MG/ML  SOLN  TECHNIQUE: Informed consent was obtained from the patient following explanation of the procedure, risks, benefits and alternatives. The patient understands, agrees and consents for the procedure. All questions were addressed. A time out was performed.  Maximal barrier sterile technique utilized including caps, mask, sterile gowns, sterile gloves, large sterile drape, hand hygiene, and chlorhexidine skin prep.  The right  chest and abdomen were interrogated with ultrasound. There is a very large right-sided pleural effusion with near total atelectasis of the right lung. Minimal perihepatic ascites is present. The volume of ascites is insignificant.  Under real-time sonographic guidance, a 5 French 10 cm Yueh centesis catheter was advanced into the right pleural space. Using Vacutainer bottles and standard technique, a total of 2.5 L of pleural fluid was removed during the course of the case. Intermittent imaging with fluoroscopy demonstrated successful re-expansion of the lung.  Attention was next turned to the liver. A segment of the anterior right portal vein was identified. Under real-time sonographic guidance, this vessel was punctured using a [place] needle. A wire was advanced into the main portal vein. The each she will needle was removed and the Accustick sheath advanced into the main portal vein. The portal venography was performed. Flow is hepatopetal. Conventional portal venous anatomy. No significant varices.  Attention was now turned to the right neck. The right neck was interrogated with ultrasound. The internal jugular vein is widely patent and compressible. An image was obtained and stored for The record. a small dermatotomy was made. Under real-time sonographic guidance, the internal jugular vein was punctured with a 21  gauge micropuncture needle. Using standard technique, the initial micro wire was exchanged for a 0.035 wire using a transitional 5 [place] micro sheath.  The skin tract was dilated to 10 [place] and a Cook 10 [place] TIPS sheath was advanced into the right heart. Coaxially, an angled catheter was advanced over a Bentson wire. This catheter was used to select the right hepatic vein. The catheter was advanced into the right hepatic vein and venography confirmed the right hepatic venous anatomy.  The Bentson wire was exchanged for a superstiff Amplatz wire. The coal up into needle was advanced over the Amplatz wire. Using biplane and a single puncture, the right main portal vein was successfully punctured a road runner hydrophilic wire was advanced into the main portal vein. The coal open to needle was removed. A pigtail catheter was advanced over the wire and positioned within the main portal vein. Main portal venography was again performed. Again, no evidence of significant varices and excellent hepatopetal flow.  Pressure measurements were next obtained. The pressure in the main portal vein is 24 mm of mercury. The pressure in the free right hepatic vein is 13 mm of mercury. This yields a portosystemic gradient of 11 mm Hg.  The Amplatz wire was advanced into the splenic vein. The pigtail catheter was removed. The transhepatic tract was pre dilated to 6 mm using a 6 x 4 mm must and balloon. The TIPS sheath was advanced over the [facility] balloon and into the portal vein. A 10 mm x 9 cm (7 mm covered, 2 cm uncovered) Viatorr stent graft was selected. This was advanced through the sheath. The uncovered portion of the stent was deployed in the main portal vein and pulled back to the main portal venous entry site. The remainder of the stent was un sheath and deployed. The stent was post dilated to 8 mm using an 8 x 4 Conquest balloon.  The pigtail catheter was reintroduced in the main portal vein over a wire. Portal  venography demonstrates a well-positioned TIPS. There is still some hepatopetal flow into the main right and left portal veins. Flow is brisk. Pressure measurements were again obtained. The main portal venous pressure is 20 mm of mercury. The suprahepatic IVC pressure is 18 mm of mercury.  The portosystemic gradient is 2 mm of mercury.  The pigtail catheter was removed over a wire. The TIPS sheath was removed and hemostasis attained by manual pressure.  The Yueh centesis catheter was removed from the right pleural space. A sterile bandage was applied. The portal venous access catheter was carefully brought back under fluoroscopy to the portal venous puncture site. As this catheter was removed from the liver, the transhepatic tract was embolized with a Gel-Foam slurry.  The patient tolerated the procedure well and was extubated in the procedure room.  COMPLICATIONS: None  IMPRESSION: 1. Successful creation of a right hepatic to right portal venous TIPS using a 10 mm x 9 cm Viatorr stent graft which was post dilated to 8 mm. The portosystemic gradient decreased from 11 mm Hg pre TIPS to 2 mm Hg post TIPS. 2. Ultrasound-guided right thoracentesis with removal of 2.5 L pleural fluid.  PLAN: 1. Chest x-ray in an to assess reaccumulation of right-sided pleural effusion. 2. Monitor for signs of hepatic encephalopathy. Will begin lactulose tonight.  Signed,  Criselda Peaches, MD  [facility]  [facility]   Electronically Signed   By: Jacqulynn Cadet M.D.   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  EXAM: TRANSJUGULAR INTRAHEPATIC PORTOSYSTEMIC SHUNT; IR THORACENTESIS ASP PLEURAL SPACE W/IMG GUIDE  Date: 07/16/2015  PROCEDURE: 1. Ultrasound-guided right thoracentesis 2. Ultrasound-guided transhepatic portal venous access 3.  Ultrasound-guided puncture right internal jugular vein 4. Catheterization of the right hepatic vein 5. Right hepatic venogram 6. Portal venogram 7. Successful transhepatic puncture of the right portal vein from a right hepatic venous approach 8. Additional portal venography and pressure measurements 9. TIPS creation 10. Post TIPS portal venography and pressure measurements Interventional Radiologist:  Criselda Peaches, MD  ANESTHESIA/SEDATION: General anesthesia provided by the anesthesiology service.  MEDICATIONS: None additional  FLUOROSCOPY TIME:  12 minutes 36 seconds  673.3 mGy  CONTRAST:  13mL OMNIPAQUE IOHEXOL 300 MG/ML  SOLN  TECHNIQUE: Informed consent was obtained from the patient following explanation of the procedure, risks, benefits and alternatives. The patient understands, agrees and consents for the procedure. All questions were addressed. A time out was performed.  Maximal barrier sterile technique utilized including caps, mask, sterile gowns, sterile gloves, large sterile drape, hand hygiene, and chlorhexidine skin prep.  The right chest and abdomen were interrogated with ultrasound. There is a very large right-sided pleural effusion with near total atelectasis of the right lung. Minimal perihepatic ascites is present. The volume of ascites is insignificant.  Under real-time sonographic guidance, a 5 French 10 cm Yueh centesis catheter was advanced into the right pleural space. Using Vacutainer bottles and standard technique, a total of 2.5 L of pleural fluid was removed during the course of the case. Intermittent imaging with fluoroscopy demonstrated successful re-expansion of the lung.  Attention was next turned to the liver. A segment of the anterior right portal vein was identified. Under real-time sonographic guidance, this vessel was punctured using a [place] needle. A wire was advanced into the main portal vein. The each she will needle was removed and the Accustick sheath advanced  into the main portal vein. The portal venography was performed. Flow is hepatopetal. Conventional portal venous anatomy. No significant varices.  Attention was now turned to the right neck. The right neck was interrogated with ultrasound. The internal  jugular vein is widely patent and compressible. An image was obtained and stored for The record. a small dermatotomy was made. Under real-time sonographic guidance, the internal jugular vein was punctured with a 21 gauge micropuncture needle. Using standard technique, the initial micro wire was exchanged for a 0.035 wire using a transitional 5 [place] micro sheath.  The skin tract was dilated to 10 [place] and a Cook 10 [place] TIPS sheath was advanced into the right heart. Coaxially, an angled catheter was advanced over a Bentson wire. This catheter was used to select the right hepatic vein. The catheter was advanced into the right hepatic vein and venography confirmed the right hepatic venous anatomy.  The Bentson wire was exchanged for a superstiff Amplatz wire. The coal up into needle was advanced over the Amplatz wire. Using biplane and a single puncture, the right main portal vein was successfully punctured a road runner hydrophilic wire was advanced into the main portal vein. The coal open to needle was removed. A pigtail catheter was advanced over the wire and positioned within the main portal vein. Main portal venography was again performed. Again, no evidence of significant varices and excellent hepatopetal flow.  Pressure measurements were next obtained. The pressure in the main portal vein is 24 mm of mercury. The pressure in the free right hepatic vein is 13 mm of mercury. This yields a portosystemic gradient of 11 mm Hg.  The Amplatz wire was advanced into the splenic vein. The pigtail catheter was removed. The transhepatic tract was pre dilated to 6 mm using a 6 x 4 mm must and balloon. The TIPS sheath was advanced over the [facility] balloon and into the  portal vein. A 10 mm x 9 cm (7 mm covered, 2 cm uncovered) Viatorr stent graft was selected. This was advanced through the sheath. The uncovered portion of the stent was deployed in the main portal vein and pulled back to the main portal venous entry site. The remainder of the stent was un sheath and deployed. The stent was post dilated to 8 mm using an 8 x 4 Conquest balloon.  The pigtail catheter was reintroduced in the main portal vein over a wire. Portal venography demonstrates a well-positioned TIPS. There is still some hepatopetal flow into the main right and left portal veins. Flow is brisk. Pressure measurements were again obtained. The main portal venous pressure is 20 mm of mercury. The suprahepatic IVC pressure is 18 mm of mercury. The portosystemic gradient is 2 mm of mercury.  The pigtail catheter was removed over a wire. The TIPS sheath was removed and hemostasis attained by manual pressure.  The Yueh centesis catheter was removed from the right pleural space. A sterile bandage was applied. The portal venous access catheter was carefully brought back under fluoroscopy to the portal venous puncture site. As this catheter was removed from the liver, the transhepatic tract was embolized with a Gel-Foam slurry.  The patient tolerated the procedure well and was extubated in the procedure room.  COMPLICATIONS: None  IMPRESSION: 1. Successful creation of a right hepatic to right portal venous TIPS using a 10 mm x 9 cm Viatorr stent graft which was post dilated to 8 mm. The portosystemic gradient decreased from 11 mm Hg pre TIPS to 2 mm Hg post TIPS. 2. Ultrasound-guided right thoracentesis with removal of 2.5 L pleural fluid.  PLAN: 1. Chest x-ray in an to assess reaccumulation of right-sided pleural effusion. 2. Monitor for signs of hepatic encephalopathy. Will begin lactulose  tonight.  Signed,  Criselda Peaches, MD  [facility]  [facility]   Electronically  Signed   By: Jacqulynn Cadet M.D.   On: 07/16/2015 18:33   US Thoracentesis Asp Pleural Space W/img Guide  07/04/2015   INDICATION: Cirrhosis, dyspnea, prior history of breast cancer, recurrent right pleural effusion/ probable right hepatic hydrothorax ; request is made for diagnostic and therapeutic right thoracentesis.  EXAM: ULTRASOUND GUIDED DIAGNOSTIC AND THERAPEUTIC RIGHT THORACENTESIS  COMPARISON:  Prior thoracentesis on 06/16/2015  MEDICATIONS: None  COMPLICATIONS: None immediate  TECHNIQUE: Informed written consent was obtained from the patient after a discussion of the risks, benefits and alternatives to treatment. A timeout was performed prior to the initiation of the procedure.  Initial ultrasound scanning demonstrates a large right pleural effusion. The lower chest was prepped and draped in the usual sterile fashion. 1% lidocaine was used for local anesthesia.  An ultrasound image was saved for documentation purposes. A 6 Fr Safe-T-Centesis catheter was introduced. The thoracentesis was performed. The catheter was removed and a dressing was applied. The patient tolerated the procedure well without immediate post procedural complication. The patient was escorted to have an upright chest radiograph.  FINDINGS: A total of approximately 2.2 liters of hazy, yellow fluid was removed. Requested samples were sent to the laboratory. Only the above amount of fluid was removed at this time secondary to increased patient coughing.  IMPRESSION: Successful ultrasound-guided diagnostic and therapeutic right sided thoracentesis yielding 2.2 liters of pleural fluid.  Read by: Rowe Robert, PA-C   Electronically Signed   By: Jacqulynn Cadet M.D.   On: 07/04/2015 13:05    CBC  Recent Labs Lab 07/16/15 0724 07/17/15 0940  WBC 9.0 16.8*  HGB 10.7* 10.6*  HCT 32.7* 33.1*  PLT 251 171  MCV 80.0 80.5  MCH 26.2 25.8*  MCHC 32.7 32.0  RDW 15.1 15.0    Chemistries   Recent Labs Lab 07/13/15 1005  07/14/15 0545 07/15/15 0510 07/16/15 0724 07/17/15 0940  NA 137 135 136 138 137  K 5.2* 5.1 4.7 4.8 5.1  CL 105 105 104 107 106  CO2 24 23 26 25 23   GLUCOSE 83 83 86 87 99  BUN 40* 36* 29* 27* 28*  CREATININE 1.47* 1.34* 1.23* 1.50* 1.57*  CALCIUM 8.6* 8.4* 8.5* 8.5* 8.3*  AST  --  52*  --  51*  --   ALT  --  22  --  24  --   ALKPHOS  --  138*  --  137*  --   BILITOT  --  1.0  --  0.9  --    ------------------------------------------------------------------------------------------------------------------ estimated creatinine clearance is 34.9 mL/min (by C-G formula based on Cr of 1.57). ------------------------------------------------------------------------------------------------------------------ No results for input(s): HGBA1C in the last 72 hours. ------------------------------------------------------------------------------------------------------------------ No results for input(s): CHOL, HDL, LDLCALC, TRIG, CHOLHDL, LDLDIRECT in the last 72 hours. ------------------------------------------------------------------------------------------------------------------ No results for input(s): TSH, T4TOTAL, T3FREE, THYROIDAB in the last 72 hours.  Invalid input(s): FREET3 ------------------------------------------------------------------------------------------------------------------ No results for input(s): VITAMINB12, FOLATE, FERRITIN, TIBC, IRON, RETICCTPCT in the last 72 hours.  Coagulation profile  Recent Labs Lab 07/14/15 0545  INR 1.45    No results for input(s): DDIMER in the last 72 hours.  Cardiac Enzymes No results for input(s): CKMB, TROPONINI, MYOGLOBIN in the last 168 hours.  Invalid input(s): CK ------------------------------------------------------------------------------------------------------------------ Invalid input(s): POCBNP  No results for input(s): GLUCAP in the last 72 hours.   RAI,RIPUDEEP M.D. [facility] 07/17/2015, 2:04  PM  Pager: [phone] Between 7am to 7pm - call Pager - [phone]  After 7pm go to [email or web address] - password [email or web address]  Call night coverage person covering after 7pm

## 2015-07-17 NOTE — Progress Notes (Signed)
Progress Note   Subjective  Patient underwent thoracentesis and TIPS yesterday per IR. She tolerated it well and feel swell today. Breathing much better. No abdominal pains.    Objective   Vital signs in last 24 hours: Temp:  [97.7 F (36.5 C)-98.5 F (36.9 C)] 98.5 F (36.9 C) (09/17 0538) Pulse Rate:  [55-84] 69 (09/17 0538) Resp:  [16-20] 19 (09/17 0538) BP: (81-151)/(46-69) 120/62 mmHg (09/17 0538) SpO2:  [93 %-99 %] 99 % (09/17 0538) Arterial Line BP: (107-133)/(43-64) 107/43 mmHg (09/16 1845) Weight:  [142 lb 6.7 oz (64.6 kg)] 142 lb 6.7 oz (64.6 kg) (09/17 0500) Last BM Date: 07/15/15 General:    AA female in NAD Heart:  Regular rate and rhythm; no murmurs Lungs: clear on left, right side much more clear compared to previous Abdomen:  Soft, mild distension, nontender. Normal bowel sounds. Extremities:  Without edema. Neurologic:  Alert and oriented,  grossly normal neurologically. No encephalopathy Psych:  Cooperative. Normal mood and affect.  Intake/Output from previous day: 09/16 0701 - 09/17 0700 In: 2530 [P.O.:30; I.V.:2000; IV Piggyback:500] Out: 200 [Urine:200] Intake/Output this shift: Total I/O In: -  Out: 300 [Urine:300]  Lab Results:  Recent Labs  07/16/15 0724  WBC 9.0  HGB 10.7*  HCT 32.7*  PLT 251   BMET  Recent Labs  07/15/15 0510 07/16/15 0724  NA 136 138  K 4.7 4.8  CL 104 107  CO2 26 25  GLUCOSE 86 87  BUN 29* 27*  CREATININE 1.23* 1.50*  CALCIUM 8.5* 8.5*   LFT  Recent Labs  07/16/15 0724  PROT 5.7*  ALBUMIN 1.7*  AST 51*  ALT 24  ALKPHOS 137*  BILITOT 0.9   PT/INR No results for input(s): LABPROT, INR in the last 72 hours.  Studies/Results: Dg Chest 1 View  07/16/2015   CLINICAL DATA:  59 year old female with right-sided pleural effusion status post right thoracentesis.  EXAM: CHEST  1 VIEW  COMPARISON:  An earlier Radiograph dated 07/16/2015  FINDINGS: There has been interval decrease in the size of the  right all pleural effusion. Aerated lung tissue is now seen in the right hemithorax. There is diffuse increased interstitial and airspace opacity throughout the right lung. The left lung is clear. Stable cardiac silhouette. No pneumothorax. There is degenerative changes of the spine and shoulder. No acute fracture.  IMPRESSION: Significant interval reduction in size of the right pleural effusion with aeration of the right lung.  Diffuse right lung interstitial and airspace opacity likely represents a combination of atelectasis/ edema. Pneumonia is not excluded. Follow-up recommended.  No pneumothorax.   Electronically Signed   By: Anner Crete M.D.   On: 07/16/2015 19:08   Dg Chest 2 View  07/16/2015   CLINICAL DATA:  Chest soreness.  Follow-up pleural effusion.  EXAM: CHEST  2 VIEW  COMPARISON:  07/07/2015  FINDINGS: Complete opacification of the right hemithorax. Left lung is clear. Heart is normal size. No acute bony abnormality.  IMPRESSION: Stable complete opacification of the right hemithorax.   Electronically Signed   By: Rolm Baptise M.D.   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  EXAM: TRANSJUGULAR INTRAHEPATIC PORTOSYSTEMIC SHUNT; IR THORACENTESIS ASP PLEURAL SPACE W/IMG GUIDE  Date: 07/16/2015  PROCEDURE: 1. Ultrasound-guided right thoracentesis 2. Ultrasound-guided transhepatic portal venous access 3. Ultrasound-guided puncture right internal jugular vein 4. Catheterization of the right  hepatic vein 5. Right hepatic venogram 6. Portal venogram 7. Successful transhepatic puncture of the right portal vein from a right hepatic venous approach 8. Additional portal venography and pressure measurements 9. TIPS creation 10. Post TIPS portal venography and pressure measurements Interventional Radiologist:  Criselda Peaches, MD  ANESTHESIA/SEDATION: General anesthesia provided by the  anesthesiology service.  MEDICATIONS: None additional  FLUOROSCOPY TIME:  12 minutes 36 seconds  673.3 mGy  CONTRAST:  69mL OMNIPAQUE IOHEXOL 300 MG/ML  SOLN  TECHNIQUE: Informed consent was obtained from the patient following explanation of the procedure, risks, benefits and alternatives. The patient understands, agrees and consents for the procedure. All questions were addressed. A time out was performed.  Maximal barrier sterile technique utilized including caps, mask, sterile gowns, sterile gloves, large sterile drape, hand hygiene, and chlorhexidine skin prep.  The right chest and abdomen were interrogated with ultrasound. There is a very large right-sided pleural effusion with near total atelectasis of the right lung. Minimal perihepatic ascites is present. The volume of ascites is insignificant.  Under real-time sonographic guidance, a 5 French 10 cm Yueh centesis catheter was advanced into the right pleural space. Using Vacutainer bottles and standard technique, a total of 2.5 L of pleural fluid was removed during the course of the case. Intermittent imaging with fluoroscopy demonstrated successful re-expansion of the lung.  Attention was next turned to the liver. A segment of the anterior right portal vein was identified. Under real-time sonographic guidance, this vessel was punctured using a Paris needle. A wire was advanced into the main portal vein. The each she will needle was removed and the Accustick sheath advanced into the main portal vein. The portal venography was performed. Flow is hepatopetal. Conventional portal venous anatomy. No significant varices.  Attention was now turned to the right neck. The right neck was interrogated with ultrasound. The internal jugular vein is widely patent and compressible. An image was obtained and stored for The record. a small dermatotomy was made. Under real-time sonographic guidance, the internal jugular vein was punctured with a 21 gauge  micropuncture needle. Using standard technique, the initial micro wire was exchanged for a 0.035 wire using a transitional 5 [place] micro sheath.  The skin tract was dilated to 10 [place] and a Cook 10 [place] TIPS sheath was advanced into the right heart. Coaxially, an angled catheter was advanced over a Bentson wire. This catheter was used to select the right hepatic vein. The catheter was advanced into the right hepatic vein and venography confirmed the right hepatic venous anatomy.  The Bentson wire was exchanged for a superstiff Amplatz wire. The coal up into needle was advanced over the Amplatz wire. Using biplane and a single puncture, the right main portal vein was successfully punctured a road runner hydrophilic wire was advanced into the main portal vein. The coal open to needle was removed. A pigtail catheter was advanced over the wire and positioned within the main portal vein. Main portal venography was again performed. Again, no evidence of significant varices and excellent hepatopetal flow.  Pressure measurements were next obtained. The pressure in the main portal vein is 24 mm of mercury. The pressure in the free right hepatic vein is 13 mm of mercury. This yields a portosystemic gradient of 11 mm Hg.  The Amplatz wire was advanced into the splenic vein. The pigtail catheter was removed. The transhepatic tract was pre dilated to 6 mm using a 6 x 4 mm must and balloon. The TIPS  sheath was advanced over the [place]ang balloon and into the portal vein. A 10 mm x 9 cm (7 mm covered, 2 cm uncovered) Viatorr stent graft was selected. This was advanced through the sheath. The uncovered portion of the stent was deployed in the main portal vein and pulled back to the main portal venous entry site. The remainder of the stent was un sheath and deployed. The stent was post dilated to 8 mm using an 8 x 4 Conquest balloon.  The pigtail catheter was reintroduced in the main portal vein over a wire. Portal venography  demonstrates a well-positioned TIPS. There is still some hepatopetal flow into the main right and left portal veins. Flow is brisk. Pressure measurements were again obtained. The main portal venous pressure is 20 mm of mercury. The suprahepatic IVC pressure is 18 mm of mercury. The portosystemic gradient is 2 mm of mercury.  The pigtail catheter was removed over a wire. The TIPS sheath was removed and hemostasis attained by manual pressure.  The Yueh centesis catheter was removed from the right pleural space. A sterile bandage was applied. The portal venous access catheter was carefully brought back under fluoroscopy to the portal venous puncture site. As this catheter was removed from the liver, the transhepatic tract was embolized with a Gel-Foam slurry.  The patient tolerated the procedure well and was extubated in the procedure room.  COMPLICATIONS: None  IMPRESSION: 1. Successful creation of a right hepatic to right portal venous TIPS using a 10 mm x 9 cm Viatorr stent graft which was post dilated to 8 mm. The portosystemic gradient decreased from 11 mm Hg pre TIPS to 2 mm Hg post TIPS. 2. Ultrasound-guided right thoracentesis with removal of 2.5 L pleural fluid.  PLAN: 1. Chest x-ray in an to assess reaccumulation of right-sided pleural effusion. 2. Monitor for signs of hepatic encephalopathy. Will begin lactulose tonight.  Signed,  Criselda Peaches, MD  [facility]  [facility]   Electronically Signed   By: Jacqulynn Cadet M.D.   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  EXAM: TRANSJUGULAR INTRAHEPATIC PORTOSYSTEMIC SHUNT; IR THORACENTESIS ASP PLEURAL SPACE W/IMG GUIDE  Date: 07/16/2015  PROCEDURE: 1. Ultrasound-guided right thoracentesis 2. Ultrasound-guided transhepatic portal venous access 3.  Ultrasound-guided puncture right internal jugular vein 4. Catheterization of the right hepatic vein 5. Right hepatic venogram 6. Portal venogram 7. Successful transhepatic puncture of the right portal vein from a right hepatic venous approach 8. Additional portal venography and pressure measurements 9. TIPS creation 10. Post TIPS portal venography and pressure measurements Interventional Radiologist:  Criselda Peaches, MD  ANESTHESIA/SEDATION: General anesthesia provided by the anesthesiology service.  MEDICATIONS: None additional  FLUOROSCOPY TIME:  12 minutes 36 seconds  673.3 mGy  CONTRAST:  28mL OMNIPAQUE IOHEXOL 300 MG/ML  SOLN  TECHNIQUE: Informed consent was obtained from the patient following explanation of the procedure, risks, benefits and alternatives. The patient understands, agrees and consents for the procedure. All questions were addressed. A time out was performed.  Maximal barrier sterile technique utilized including caps, mask, sterile gowns, sterile gloves, large sterile drape, hand hygiene, and chlorhexidine skin prep.  The right chest and abdomen were interrogated with ultrasound. There is a very large right-sided pleural effusion with near total atelectasis of the right lung. Minimal perihepatic ascites is present. The volume of ascites is  insignificant.  Under real-time sonographic guidance, a 5 French 10 cm Yueh centesis catheter was advanced into the right pleural space. Using Vacutainer bottles and standard technique, a total of 2.5 L of pleural fluid was removed during the course of the case. Intermittent imaging with fluoroscopy demonstrated successful re-expansion of the lung.  Attention was next turned to the liver. A segment of the anterior right portal vein was identified. Under real-time sonographic guidance, this vessel was punctured using a [place] needle. A wire was advanced into the main portal vein. The each she will needle was removed and the Accustick sheath advanced  into the main portal vein. The portal venography was performed. Flow is hepatopetal. Conventional portal venous anatomy. No significant varices.  Attention was now turned to the right neck. The right neck was interrogated with ultrasound. The internal jugular vein is widely patent and compressible. An image was obtained and stored for The record. a small dermatotomy was made. Under real-time sonographic guidance, the internal jugular vein was punctured with a 21 gauge micropuncture needle. Using standard technique, the initial micro wire was exchanged for a 0.035 wire using a transitional 5 [place] micro sheath.  The skin tract was dilated to 10 [place] and a Cook 10 [place] TIPS sheath was advanced into the right heart. Coaxially, an angled catheter was advanced over a Bentson wire. This catheter was used to select the right hepatic vein. The catheter was advanced into the right hepatic vein and venography confirmed the right hepatic venous anatomy.  The Bentson wire was exchanged for a superstiff Amplatz wire. The coal up into needle was advanced over the Amplatz wire. Using biplane and a single puncture, the right main portal vein was successfully punctured a road runner hydrophilic wire was advanced into the main portal vein. The coal open to needle was removed. A pigtail catheter was advanced over the wire and positioned within the main portal vein. Main portal venography was again performed. Again, no evidence of significant varices and excellent hepatopetal flow.  Pressure measurements were next obtained. The pressure in the main portal vein is 24 mm of mercury. The pressure in the free right hepatic vein is 13 mm of mercury. This yields a portosystemic gradient of 11 mm Hg.  The Amplatz wire was advanced into the splenic vein. The pigtail catheter was removed. The transhepatic tract was pre dilated to 6 mm using a 6 x 4 mm must and balloon. The TIPS sheath was advanced over the [facility] balloon and into the  portal vein. A 10 mm x 9 cm (7 mm covered, 2 cm uncovered) Viatorr stent graft was selected. This was advanced through the sheath. The uncovered portion of the stent was deployed in the main portal vein and pulled back to the main portal venous entry site. The remainder of the stent was un sheath and deployed. The stent was post dilated to 8 mm using an 8 x 4 Conquest balloon.  The pigtail catheter was reintroduced in the main portal vein over a wire. Portal venography demonstrates a well-positioned TIPS. There is still some hepatopetal flow into the main right and left portal veins. Flow is brisk. Pressure measurements were again obtained. The main portal venous pressure is 20 mm of mercury. The suprahepatic IVC pressure is 18 mm of mercury. The portosystemic gradient is 2 mm of mercury.  The pigtail catheter was removed over a wire. The TIPS sheath was removed and hemostasis attained by manual pressure.  The Yueh centesis catheter  was removed from the right pleural space. A sterile bandage was applied. The portal venous access catheter was carefully brought back under fluoroscopy to the portal venous puncture site. As this catheter was removed from the liver, the transhepatic tract was embolized with a Gel-Foam slurry.  The patient tolerated the procedure well and was extubated in the procedure room.  COMPLICATIONS: None  IMPRESSION: 1. Successful creation of a right hepatic to right portal venous TIPS using a 10 mm x 9 cm Viatorr stent graft which was post dilated to 8 mm. The portosystemic gradient decreased from 11 mm Hg pre TIPS to 2 mm Hg post TIPS. 2. Ultrasound-guided right thoracentesis with removal of 2.5 L pleural fluid.  PLAN: 1. Chest x-ray in an to assess reaccumulation of right-sided pleural effusion. 2. Monitor for signs of hepatic encephalopathy. Will begin lactulose tonight.  Signed,  Criselda Peaches, MD  [facility]  [facility]   Electronically  Signed   By: Jacqulynn Cadet M.D.   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 She has had a prolonged hospital stay for this issue as she did not respond to high dose diuretics over time. Now s/p thoracentesis with TIPS yesterday which she tolerated well. At this time would like to her to come off oxygen and see if she can ambulate without desaturations. If so and her effusion does not re accumulate, which it did quickly after prior thoracentesis, I think she would be stable for discharge tomorrow. Will await renal function today to ensure stable. We will need to decrease her diuretics compared to previous levels. Now on aldactone 200mg  and lasix 40mg  BID from 300mg  / 120mg  previously. Pending her renal function today, we may want to decrease lasix to 40mg  daily and aldactone to 100mg  daily. Otherwise agree with IR to treat empirically with lactulose BID to prevent encephalopathy and goal titrate to 3 BMs per day. She has been abstaining from alcohol and is motivated to continue to do so. She will need to follow up with hepatology once discharged as she is a transplant candidate and to be evaluated for this moving forward.    Please call with questions or regarding timing of discharge, will see how she does today. Thanks.  Voltaire Cellar, MD [facility] Pager [phone]  Principal Problem:   Pleural effusion associated with hepatic disorder Active Problems:   Acute respiratory distress   Alcoholic cirrhosis of liver with ascites   Recurrent right pleural effusion   Acute respiratory failure with hypoxia   Edema   SOB (shortness of breath)     LOS: 14 days   Savannah Jackson  07/17/2015, 9:00 AM

## 2015-07-17 NOTE — Progress Notes (Signed)
Referring Physician(s): Dr Havery Moros  Chief Complaint:  Alcoholic cirrhosis Recurrent hepatic hydrothorax (R)  Subjective:  TIPs procedure in IR with Dr Laurence Ferrari 07/16/15 Tolerated well  Procedure: 1.) Right thoracentesis with aspiration of 2.5L pleural fluid 2.) Transhepatic portal venous access 3.) Creation of right hepatic vein to right portal vein TIPS  Pt has no complaints Breathing easily; no pain Cognition wnl  Allergies: Latex and Vicodin  Medications: Prior to Admission medications   Medication Sig Start Date End Date Taking? Authorizing Provider  folic acid (FOLVITE) 1 MG tablet Take 1 tablet (1 mg total) by mouth daily. 04/29/15  Yes Rosita Fire, MD  lisinopril (PRINIVIL,ZESTRIL) 20 MG tablet Take 1 tablet (20 mg total) by mouth daily. 07/18/12  Yes Rosita Fire, MD  metoprolol (LOPRESSOR) 50 MG tablet Take 50 mg by mouth 2 (two) times daily.   Yes Historical Provider, MD  omeprazole (PRILOSEC) 40 MG capsule Take 40 mg by mouth daily.   Yes Historical Provider, MD  ondansetron (ZOFRAN) 4 MG tablet Take 4 mg by mouth every 8 (eight) hours as needed for nausea or vomiting.   Yes Historical Provider, MD  oxyCODONE (OXY IR/ROXICODONE) 5 MG immediate release tablet Take 1 tablet (5 mg total) by mouth 3 (three) times daily as needed for severe pain. 06/15/15  Yes Rogene Houston, MD  pantoprazole (PROTONIX) 40 MG tablet Take 1 tablet (40 mg total) by mouth daily. 04/29/15  Yes Rosita Fire, MD  Potassium Chloride ER 20 MEQ TBCR Take 20 mEq by mouth daily. 05/13/15  Yes Rogene Houston, MD  thiamine 100 MG tablet Take 1 tablet (100 mg total) by mouth daily. 04/29/15  Yes Rosita Fire, MD     Vital Signs: BP 120/62 mmHg  Pulse 69  Temp(Src) 98.5 F (36.9 C) (Oral)  Resp 19  Ht 5\' 3"  (1.6 m)  Wt 142 lb 6.7 oz (64.6 kg)  BMI 25.23 kg/m2  SpO2 99%  Physical Exam  Constitutional: She is oriented to person, place, and time.  Neck:  R IJ site NT No  bleeding No hematoma Clean and dry  Cardiovascular: Normal rate and regular rhythm.   Pulmonary/Chest: Effort normal and breath sounds normal.  Abdominal: Soft. Bowel sounds are normal. She exhibits no distension.  Site of procedure is NT no bleeding; no hematoma   Musculoskeletal: Normal range of motion.  Neurological: She is alert and oriented to person, place, and time.  Skin: Skin is warm and dry.  Psychiatric: She has a normal mood and affect. Her behavior is normal. Judgment and thought content normal.  Nursing note and vitals reviewed.   Imaging: Dg Chest 1 View  07/16/2015   CLINICAL DATA:  59 year old female with right-sided pleural effusion status post right thoracentesis.  EXAM: CHEST  1 VIEW  COMPARISON:  An earlier Radiograph dated 07/16/2015  FINDINGS: There has been interval decrease in the size of the right all pleural effusion. Aerated lung tissue is now seen in the right hemithorax. There is diffuse increased interstitial and airspace opacity throughout the right lung. The left lung is clear. Stable cardiac silhouette. No pneumothorax. There is degenerative changes of the spine and shoulder. No acute fracture.  IMPRESSION: Significant interval reduction in size of the right pleural effusion with aeration of the right lung.  Diffuse right lung interstitial and airspace opacity likely represents a combination of atelectasis/ edema. Pneumonia is not excluded. Follow-up recommended.  No pneumothorax.   Electronically Signed   By: Anner Crete  M.D.   On: 07/16/2015 19:08   Dg Chest 2 View  07/16/2015   CLINICAL DATA:  Chest soreness.  Follow-up pleural effusion.  EXAM: CHEST  2 VIEW  COMPARISON:  07/07/2015  FINDINGS: Complete opacification of the right hemithorax. Left lung is clear. Heart is normal size. No acute bony abnormality.  IMPRESSION: Stable complete opacification of the right hemithorax.   Electronically Signed   By: Rolm Baptise M.D.   On: 07/16/2015 08:06   Ir  Tips  07/16/2015   CLINICAL DATA:  59 year old female with alcoholic cirrhosis and medically refractory hepatic hydrothorax.  EXAM: TRANSJUGULAR INTRAHEPATIC PORTOSYSTEMIC SHUNT; IR THORACENTESIS ASP PLEURAL SPACE W/IMG GUIDE  Date: 07/16/2015  PROCEDURE: 1. Ultrasound-guided right thoracentesis 2. Ultrasound-guided transhepatic portal venous access 3. Ultrasound-guided puncture right internal jugular vein 4. Catheterization of the right hepatic vein 5. Right hepatic venogram 6. Portal venogram 7. Successful transhepatic puncture of the right portal vein from a right hepatic venous approach 8. Additional portal venography and pressure measurements 9. TIPS creation 10. Post TIPS portal venography and pressure measurements Interventional Radiologist:  Criselda Peaches, MD  ANESTHESIA/SEDATION: General anesthesia provided by the anesthesiology service.  MEDICATIONS: None additional  FLUOROSCOPY TIME:  12 minutes 36 seconds  673.3 mGy  CONTRAST:  30mL OMNIPAQUE IOHEXOL 300 MG/ML  SOLN  TECHNIQUE: Informed consent was obtained from the patient following explanation of the procedure, risks, benefits and alternatives. The patient understands, agrees and consents for the procedure. All questions were addressed. A time out was performed.  Maximal barrier sterile technique utilized including caps, mask, sterile gowns, sterile gloves, large sterile drape, hand hygiene, and chlorhexidine skin prep.  The right chest and abdomen were interrogated with ultrasound. There is a very large right-sided pleural effusion with near total atelectasis of the right lung. Minimal perihepatic ascites is present. The volume of ascites is insignificant.  Under real-time sonographic guidance, a 5 French 10 cm Yueh centesis catheter was advanced into the right pleural space. Using Vacutainer bottles and standard technique, a total of 2.5 L of pleural fluid was removed during the course of the case. Intermittent imaging with fluoroscopy  demonstrated successful re-expansion of the lung.  Attention was next turned to the liver. A segment of the anterior right portal vein was identified. Under real-time sonographic guidance, this vessel was punctured using a Corson needle. A wire was advanced into the main portal vein. The each she will needle was removed and the Accustick sheath advanced into the main portal vein. The portal venography was performed. Flow is hepatopetal. Conventional portal venous anatomy. No significant varices.  Attention was now turned to the right neck. The right neck was interrogated with ultrasound. The internal jugular vein is widely patent and compressible. An image was obtained and stored for The record. a small dermatotomy was made. Under real-time sonographic guidance, the internal jugular vein was punctured with a 21 gauge micropuncture needle. Using standard technique, the initial micro wire was exchanged for a 0.035 wire using a transitional 5 [place] micro sheath.  The skin tract was dilated to 10 [place] and a Cook 10 [place] TIPS sheath was advanced into the right heart. Coaxially, an angled catheter was advanced over a Bentson wire. This catheter was used to select the right hepatic vein. The catheter was advanced into the right hepatic vein and venography confirmed the right hepatic venous anatomy.  The Bentson wire was exchanged for a superstiff Amplatz wire. The coal up into needle was advanced over the  Amplatz wire. Using biplane and a single puncture, the right main portal vein was successfully punctured a road runner hydrophilic wire was advanced into the main portal vein. The coal open to needle was removed. A pigtail catheter was advanced over the wire and positioned within the main portal vein. Main portal venography was again performed. Again, no evidence of significant varices and excellent hepatopetal flow.  Pressure measurements were next obtained. The pressure in the main portal vein is 24 mm of  mercury. The pressure in the free right hepatic vein is 13 mm of mercury. This yields a portosystemic gradient of 11 mm Hg.  The Amplatz wire was advanced into the splenic vein. The pigtail catheter was removed. The transhepatic tract was pre dilated to 6 mm using a 6 x 4 mm must and balloon. The TIPS sheath was advanced over the [facility] balloon and into the portal vein. A 10 mm x 9 cm (7 mm covered, 2 cm uncovered) Viatorr stent graft was selected. This was advanced through the sheath. The uncovered portion of the stent was deployed in the main portal vein and pulled back to the main portal venous entry site. The remainder of the stent was un sheath and deployed. The stent was post dilated to 8 mm using an 8 x 4 Conquest balloon.  The pigtail catheter was reintroduced in the main portal vein over a wire. Portal venography demonstrates a well-positioned TIPS. There is still some hepatopetal flow into the main right and left portal veins. Flow is brisk. Pressure measurements were again obtained. The main portal venous pressure is 20 mm of mercury. The suprahepatic IVC pressure is 18 mm of mercury. The portosystemic gradient is 2 mm of mercury.  The pigtail catheter was removed over a wire. The TIPS sheath was removed and hemostasis attained by manual pressure.  The Yueh centesis catheter was removed from the right pleural space. A sterile bandage was applied. The portal venous access catheter was carefully brought back under fluoroscopy to the portal venous puncture site. As this catheter was removed from the liver, the transhepatic tract was embolized with a Gel-Foam slurry.  The patient tolerated the procedure well and was extubated in the procedure room.  COMPLICATIONS: None  IMPRESSION: 1. Successful creation of a right hepatic to right portal venous TIPS using a 10 mm x 9 cm Viatorr stent graft which was post dilated to 8 mm. The portosystemic gradient decreased from 11 mm Hg pre TIPS to 2 mm Hg post TIPS. 2.  Ultrasound-guided right thoracentesis with removal of 2.5 L pleural fluid.  PLAN: 1. Chest x-ray in an to assess reaccumulation of right-sided pleural effusion. 2. Monitor for signs of hepatic encephalopathy. Will begin lactulose tonight.  Signed,  Criselda Peaches, MD  [facility]  [facility]   Electronically Signed   By: Jacqulynn Cadet M.D.   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  EXAM: TRANSJUGULAR INTRAHEPATIC PORTOSYSTEMIC SHUNT; IR THORACENTESIS ASP PLEURAL SPACE W/IMG GUIDE  Date: 07/16/2015  PROCEDURE: 1. Ultrasound-guided right thoracentesis 2. Ultrasound-guided transhepatic portal venous access 3. Ultrasound-guided puncture right internal jugular vein 4. Catheterization of the right hepatic vein 5. Right hepatic venogram 6. Portal venogram 7. Successful transhepatic puncture of the right portal vein from a right hepatic venous approach 8. Additional portal venography and pressure measurements 9. TIPS creation 10. Post  TIPS portal venography and pressure measurements Interventional Radiologist:  Criselda Peaches, MD  ANESTHESIA/SEDATION: General anesthesia provided by the anesthesiology service.  MEDICATIONS: None additional  FLUOROSCOPY TIME:  12 minutes 36 seconds  673.3 mGy  CONTRAST:  53mL OMNIPAQUE IOHEXOL 300 MG/ML  SOLN  TECHNIQUE: Informed consent was obtained from the patient following explanation of the procedure, risks, benefits and alternatives. The patient understands, agrees and consents for the procedure. All questions were addressed. A time out was performed.  Maximal barrier sterile technique utilized including caps, mask, sterile gowns, sterile gloves, large sterile drape, hand hygiene, and chlorhexidine skin prep.  The right chest and abdomen were interrogated with ultrasound.  There is a very large right-sided pleural effusion with near total atelectasis of the right lung. Minimal perihepatic ascites is present. The volume of ascites is insignificant.  Under real-time sonographic guidance, a 5 French 10 cm Yueh centesis catheter was advanced into the right pleural space. Using Vacutainer bottles and standard technique, a total of 2.5 L of pleural fluid was removed during the course of the case. Intermittent imaging with fluoroscopy demonstrated successful re-expansion of the lung.  Attention was next turned to the liver. A segment of the anterior right portal vein was identified. Under real-time sonographic guidance, this vessel was punctured using a Bruning needle. A wire was advanced into the main portal vein. The each she will needle was removed and the Accustick sheath advanced into the main portal vein. The portal venography was performed. Flow is hepatopetal. Conventional portal venous anatomy. No significant varices.  Attention was now turned to the right neck. The right neck was interrogated with ultrasound. The internal jugular vein is widely patent and compressible. An image was obtained and stored for The record. a small dermatotomy was made. Under real-time sonographic guidance, the internal jugular vein was punctured with a 21 gauge micropuncture needle. Using standard technique, the initial micro wire was exchanged for a 0.035 wire using a transitional 5 [place] micro sheath.  The skin tract was dilated to 10 [place] and a Cook 10 [place] TIPS sheath was advanced into the right heart. Coaxially, an angled catheter was advanced over a Bentson wire. This catheter was used to select the right hepatic vein. The catheter was advanced into the right hepatic vein and venography confirmed the right hepatic venous anatomy.  The Bentson wire was exchanged for a superstiff Amplatz wire. The coal up into needle was advanced over the Amplatz wire. Using biplane and a single puncture,  the right main portal vein was successfully punctured a road runner hydrophilic wire was advanced into the main portal vein. The coal open to needle was removed. A pigtail catheter was advanced over the wire and positioned within the main portal vein. Main portal venography was again performed. Again, no evidence of significant varices and excellent hepatopetal flow.  Pressure measurements were next obtained. The pressure in the main portal vein is 24 mm of mercury. The pressure in the free right hepatic vein is 13 mm of mercury. This yields a portosystemic gradient of 11 mm Hg.  The Amplatz wire was advanced into the splenic vein. The pigtail catheter was removed. The transhepatic tract was pre dilated to 6 mm using a 6 x 4 mm must and balloon. The TIPS sheath was advanced over the [facility] balloon and into the portal vein. A 10 mm x 9 cm (7 mm covered, 2 cm uncovered) Viatorr stent graft was selected. This was advanced through the sheath. The  uncovered portion of the stent was deployed in the main portal vein and pulled back to the main portal venous entry site. The remainder of the stent was un sheath and deployed. The stent was post dilated to 8 mm using an 8 x 4 Conquest balloon.  The pigtail catheter was reintroduced in the main portal vein over a wire. Portal venography demonstrates a well-positioned TIPS. There is still some hepatopetal flow into the main right and left portal veins. Flow is brisk. Pressure measurements were again obtained. The main portal venous pressure is 20 mm of mercury. The suprahepatic IVC pressure is 18 mm of mercury. The portosystemic gradient is 2 mm of mercury.  The pigtail catheter was removed over a wire. The TIPS sheath was removed and hemostasis attained by manual pressure.  The Yueh centesis catheter was removed from the right pleural space. A sterile bandage was applied. The portal venous access catheter was carefully brought back under fluoroscopy to the portal venous  puncture site. As this catheter was removed from the liver, the transhepatic tract was embolized with a Gel-Foam slurry.  The patient tolerated the procedure well and was extubated in the procedure room.  COMPLICATIONS: None  IMPRESSION: 1. Successful creation of a right hepatic to right portal venous TIPS using a 10 mm x 9 cm Viatorr stent graft which was post dilated to 8 mm. The portosystemic gradient decreased from 11 mm Hg pre TIPS to 2 mm Hg post TIPS. 2. Ultrasound-guided right thoracentesis with removal of 2.5 L pleural fluid.  PLAN: 1. Chest x-ray in an to assess reaccumulation of right-sided pleural effusion. 2. Monitor for signs of hepatic encephalopathy. Will begin lactulose tonight.  Signed,  Criselda Peaches, MD  [facility]  [facility]iology   Electronically Signed   By: Jacqulynn Cadet M.D.   On: 07/16/2015 18:33    Labs:  CBC:  Recent Labs  07/04/15 0853 07/05/15 0510 07/06/15 0636 07/16/15 0724  WBC 10.7* 10.2 10.1 9.0  HGB 9.8* 9.6* 11.3* 10.7*  HCT 31.0* 29.2* 33.0* 32.7*  PLT 178 164 127* 251    COAGS:  Recent Labs  07/03/15 1330 07/07/15 1406 07/09/15 0710 07/14/15 0545  INR 1.59* 1.54* 1.65* 1.45    BMP:  Recent Labs  07/13/15 1005 07/14/15 0545 07/15/15 0510 07/16/15 0724  NA 137 135 136 138  K 5.2* 5.1 4.7 4.8  CL 105 105 104 107  CO2 24 23 26 25   GLUCOSE 83 83 86 87  BUN 40* 36* 29* 27*  CALCIUM 8.6* 8.4* 8.5* 8.5*  CREATININE 1.47* 1.34* 1.23* 1.50*  GFRNONAA 38* 42* 47* 37*  GFRAA 44* 49* 55* 43*    LIVER FUNCTION TESTS:  Recent Labs  07/08/15 0811 07/09/15 0710 07/14/15 0545 07/16/15 0724  BILITOT 1.5* 1.4* 1.0 0.9  AST 67* 61* 52* 51*  ALT 23 25 22 24   ALKPHOS 144* 151* 138* 137*  PROT 5.3* 5.3* 5.1* 5.7*  ALBUMIN 1.7* 1.7* 1.6* 1.7*    Assessment and Plan:  ETOH cirrhosis Rt hepatic hydrothorax TIPs in IR 9/16 Doing well No complaints May DC if ok with GI Will follow  up with Dr Laurence Ferrari in 2-4 weeks at [facility] for TIPs [place] and exam See follow up and dc orders Pt will hear from scheduler for time and date  Signed: TURPIN,PAMELA A 07/17/2015, 8:48 AM   I spent a total of 15 Minutes at the the patient's bedside AND on the patient's hospital floor  or unit, greater than 50% of which was counseling/coordinating care for TIPs procedure

## 2015-07-18 LAB — BASIC METABOLIC PANEL
Anion gap: 6 (ref 5–15)
BUN: 25 mg/dL — AB (ref 6–20)
CALCIUM: 8 mg/dL — AB (ref 8.9–10.3)
CHLORIDE: 109 mmol/L (ref 101–111)
CO2: 21 mmol/L — ABNORMAL LOW (ref 22–32)
CREATININE: 1.62 mg/dL — AB (ref 0.44–1.00)
GFR, EST AFRICAN AMERICAN: 39 mL/min — AB (ref >60)
GFR, EST NON AFRICAN AMERICAN: 34 mL/min — AB (ref >60)
Glucose, Bld: 90 mg/dL (ref 65–99)
Potassium: 4.4 mmol/L (ref 3.5–5.1)
SODIUM: 136 mmol/L (ref 135–145)

## 2015-07-18 LAB — CBC
HCT: 29 % — ABNORMAL LOW (ref 36.0–46.0)
Hemoglobin: 9.5 g/dL — ABNORMAL LOW (ref 12.0–15.0)
MCH: 26.2 pg (ref 26.0–34.0)
MCHC: 32.8 g/dL (ref 30.0–36.0)
MCV: 80.1 fL (ref 78.0–100.0)
PLATELETS: 158 10*3/uL (ref 150–400)
RBC: 3.62 MIL/uL — AB (ref 3.87–5.11)
RDW: 15 % (ref 11.5–15.5)
WBC: 13.5 10*3/uL — AB (ref 4.0–10.5)

## 2015-07-18 MED ORDER — LACTULOSE 10 GM/15ML PO SOLN
20.0000 g | Freq: Two times a day (BID) | ORAL | Status: DC
  Administered 2015-07-18: 20 g via ORAL

## 2015-07-18 MED ORDER — NADOLOL 20 MG PO TABS: 10.0000 mg | ORAL_TABLET | Freq: Every day | ORAL | Status: DC

## 2015-07-18 MED ORDER — SPIRONOLACTONE 100 MG PO TABS: 100.0000 mg | ORAL_TABLET | Freq: Every day | ORAL | Status: DC

## 2015-07-18 MED ORDER — TRAMADOL HCL 50 MG PO TABS: 50.0000 mg | ORAL_TABLET | Freq: Three times a day (TID) | ORAL | Status: DC | PRN

## 2015-07-18 MED ORDER — BENZONATATE 100 MG PO CAPS: 100.0000 mg | ORAL_CAPSULE | Freq: Three times a day (TID) | ORAL | Status: DC | PRN

## 2015-07-18 MED ORDER — LACTULOSE 10 GM/15ML PO SOLN: 20.0000 g | Freq: Two times a day (BID) | ORAL | Status: DC

## 2015-07-18 MED ORDER — FUROSEMIDE 40 MG PO TABS: 40.0000 mg | ORAL_TABLET | Freq: Every day | ORAL | Status: DC

## 2015-07-18 MED ORDER — LACTULOSE 10 GM/15ML PO SOLN
20.0000 g | Freq: Three times a day (TID) | ORAL | Status: DC
Start: 2015-07-18 — End: 2015-07-18

## 2015-07-18 NOTE — Progress Notes (Signed)
07/18/15 Ambulated patient in hallway 02 remain between 94-97 without 02, no need for home oxygen. Patient to be discharged home today, discharge instructions reviewed with patient and discussed her meds to be refilled.

## 2015-07-18 NOTE — Progress Notes (Signed)
Progress Note   Subjective  Patient feeling well. No oxygen requirement. Wants to go home. Breathing is much better   Objective   Vital signs in last 24 hours: Temp:  [97.8 F (36.6 C)-98.6 F (37 C)] 98.6 F (37 C) (09/18 0550) Pulse Rate:  [67-79] 67 (09/18 0550) Resp:  [16-18] 18 (09/18 0550) BP: (92-100)/(56-62) 100/62 mmHg (09/18 0550) SpO2:  [95 %-100 %] 100 % (09/18 0550) Weight:  [143 lb 5.8 oz (65.03 kg)] 143 lb 5.8 oz (65.03 kg) (09/18 0550) Last BM Date: 07/17/15 General:    AA female in NAD Heart:  Regular rate and rhythm; no murmurs Lungs: relatively CTA B, R side much improved Abdomen:  Soft, nontender . Normal bowel sounds. Extremities:  1(+) edema. Neurologic:  Alert and oriented,  grossly normal neurologically. Psych:  Cooperative. Normal mood and affect.  Intake/Output from previous day: 09/17 0701 - 09/18 0700 In: 340 [P.O.:340] Out: 1350 [Urine:1350] Intake/Output this shift:    Lab Results:  Recent Labs  07/16/15 0724 07/17/15 0940 07/18/15 0509  WBC 9.0 16.8* 13.5*  HGB 10.7* 10.6* 9.5*  HCT 32.7* 33.1* 29.0*  PLT 251 171 158   BMET  Recent Labs  07/16/15 0724 07/17/15 0940 07/18/15 0509  NA 138 137 136  K 4.8 5.1 4.4  CL 107 106 109  CO2 25 23 21*  GLUCOSE 87 99 90  BUN 27* 28* 25*  CREATININE 1.50* 1.57* 1.62*  CALCIUM 8.5* 8.3* 8.0*   LFT  Recent Labs  07/16/15 0724  PROT 5.7*  ALBUMIN 1.7*  AST 51*  ALT 24  ALKPHOS 137*  BILITOT 0.9   PT/INR No results for input(s): LABPROT, INR in the last 72 hours.  Studies/Results: Dg Chest 1 View  07/16/2015   CLINICAL DATA:  59 year old female with right-sided pleural effusion status post right thoracentesis.  EXAM: CHEST  1 VIEW  COMPARISON:  An earlier Radiograph dated 07/16/2015  FINDINGS: There has been interval decrease in the size of the right all pleural effusion. Aerated lung tissue is now seen in the right hemithorax. There is diffuse increased interstitial and  airspace opacity throughout the right lung. The left lung is clear. Stable cardiac silhouette. No pneumothorax. There is degenerative changes of the spine and shoulder. No acute fracture.  IMPRESSION: Significant interval reduction in size of the right pleural effusion with aeration of the right lung.  Diffuse right lung interstitial and airspace opacity likely represents a combination of atelectasis/ edema. Pneumonia is not excluded. Follow-up recommended.  No pneumothorax.   Electronically Signed   By: Anner Crete M.D.   On: 07/16/2015 19:08   Dg Chest 2 View  07/17/2015   CLINICAL DATA:  Right pleural effusion.  EXAM: CHEST  2 VIEW  COMPARISON:  07/16/2015  FINDINGS: The left lung is clear. There is a small right pleural effusion. There is right lung volume loss most compatible with atelectasis. There is mild bilateral interstitial thickening, right greater than left. There is no pneumothorax. The heart and mediastinal contours are unremarkable.  There are surgical clips in the right axilla.  The osseous structures are unremarkable.  IMPRESSION: 1. Trace right pleural effusion. Right lung volume loss concerning for atelectasis. Mild bilateral interstitial prominence, right greater than left.   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  EXAM: TRANSJUGULAR INTRAHEPATIC  PORTOSYSTEMIC SHUNT; IR THORACENTESIS ASP PLEURAL SPACE W/IMG GUIDE  Date: 07/16/2015  PROCEDURE: 1. Ultrasound-guided right thoracentesis 2. Ultrasound-guided transhepatic portal venous access 3. Ultrasound-guided puncture right internal jugular vein 4. Catheterization of the right hepatic vein 5. Right hepatic venogram 6. Portal venogram 7. Successful transhepatic puncture of the right portal vein from a right hepatic venous approach 8. Additional portal venography and pressure  measurements 9. TIPS creation 10. Post TIPS portal venography and pressure measurements Interventional Radiologist:  Criselda Peaches, MD  ANESTHESIA/SEDATION: General anesthesia provided by the anesthesiology service.  MEDICATIONS: None additional  FLUOROSCOPY TIME:  12 minutes 36 seconds  673.3 mGy  CONTRAST:  53mL OMNIPAQUE IOHEXOL 300 MG/ML  SOLN  TECHNIQUE: Informed consent was obtained from the patient following explanation of the procedure, risks, benefits and alternatives. The patient understands, agrees and consents for the procedure. All questions were addressed. A time out was performed.  Maximal barrier sterile technique utilized including caps, mask, sterile gowns, sterile gloves, large sterile drape, hand hygiene, and chlorhexidine skin prep.  The right chest and abdomen were interrogated with ultrasound. There is a very large right-sided pleural effusion with near total atelectasis of the right lung. Minimal perihepatic ascites is present. The volume of ascites is insignificant.  Under real-time sonographic guidance, a 5 French 10 cm Yueh centesis catheter was advanced into the right pleural space. Using Vacutainer bottles and standard technique, a total of 2.5 L of pleural fluid was removed during the course of the case. Intermittent imaging with fluoroscopy demonstrated successful re-expansion of the lung.  Attention was next turned to the liver. A segment of the anterior right portal vein was identified. Under real-time sonographic guidance, this vessel was punctured using a [place] needle. A wire was advanced into the main portal vein. The each she will needle was removed and the Accustick sheath advanced into the main portal vein. The portal venography was performed. Flow is hepatopetal. Conventional portal venous anatomy. No significant varices.  Attention was now turned to the right neck. The right neck was interrogated with ultrasound. The internal jugular vein is widely patent and  compressible. An image was obtained and stored for The record. a small dermatotomy was made. Under real-time sonographic guidance, the internal jugular vein was punctured with a 21 gauge micropuncture needle. Using standard technique, the initial micro wire was exchanged for a 0.035 wire using a transitional 5 [place] micro sheath.  The skin tract was dilated to 10 [place] and a Cook 10 [place] TIPS sheath was advanced into the right heart. Coaxially, an angled catheter was advanced over a Bentson wire. This catheter was used to select the right hepatic vein. The catheter was advanced into the right hepatic vein and venography confirmed the right hepatic venous anatomy.  The Bentson wire was exchanged for a superstiff Amplatz wire. The coal up into needle was advanced over the Amplatz wire. Using biplane and a single puncture, the right main portal vein was successfully punctured a road runner hydrophilic wire was advanced into the main portal vein. The coal open to needle was removed. A pigtail catheter was advanced over the wire and positioned within the main portal vein. Main portal venography was again performed. Again, no evidence of significant varices and excellent hepatopetal flow.  Pressure measurements were next obtained. The pressure in the main portal vein is 24 mm of mercury. The pressure in the free right hepatic vein is 13 mm of mercury. This yields a portosystemic gradient of 11 mm  Hg.  The Amplatz wire was advanced into the splenic vein. The pigtail catheter was removed. The transhepatic tract was pre dilated to 6 mm using a 6 x 4 mm must and balloon. The TIPS sheath was advanced over the [facility] balloon and into the portal vein. A 10 mm x 9 cm (7 mm covered, 2 cm uncovered) Viatorr stent graft was selected. This was advanced through the sheath. The uncovered portion of the stent was deployed in the main portal vein and pulled back to the main portal venous entry site. The remainder of the stent was un  sheath and deployed. The stent was post dilated to 8 mm using an 8 x 4 Conquest balloon.  The pigtail catheter was reintroduced in the main portal vein over a wire. Portal venography demonstrates a well-positioned TIPS. There is still some hepatopetal flow into the main right and left portal veins. Flow is brisk. Pressure measurements were again obtained. The main portal venous pressure is 20 mm of mercury. The suprahepatic IVC pressure is 18 mm of mercury. The portosystemic gradient is 2 mm of mercury.  The pigtail catheter was removed over a wire. The TIPS sheath was removed and hemostasis attained by manual pressure.  The Yueh centesis catheter was removed from the right pleural space. A sterile bandage was applied. The portal venous access catheter was carefully brought back under fluoroscopy to the portal venous puncture site. As this catheter was removed from the liver, the transhepatic tract was embolized with a Gel-Foam slurry.  The patient tolerated the procedure well and was extubated in the procedure room.  COMPLICATIONS: None  IMPRESSION: 1. Successful creation of a right hepatic to right portal venous TIPS using a 10 mm x 9 cm Viatorr stent graft which was post dilated to 8 mm. The portosystemic gradient decreased from 11 mm Hg pre TIPS to 2 mm Hg post TIPS. 2. Ultrasound-guided right thoracentesis with removal of 2.5 L pleural fluid.  PLAN: 1. Chest x-ray in an to assess reaccumulation of right-sided pleural effusion. 2. Monitor for signs of hepatic encephalopathy. Will begin lactulose tonight.  Signed,  Criselda Peaches, MD  [facility]  [facility]   Electronically Signed   By: Jacqulynn Cadet M.D.   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  EXAM: TRANSJUGULAR INTRAHEPATIC PORTOSYSTEMIC SHUNT; IR  THORACENTESIS ASP PLEURAL SPACE W/IMG GUIDE  Date: 07/16/2015  PROCEDURE: 1. Ultrasound-guided right thoracentesis 2. Ultrasound-guided transhepatic portal venous access 3. Ultrasound-guided puncture right internal jugular vein 4. Catheterization of the right hepatic vein 5. Right hepatic venogram 6. Portal venogram 7. Successful transhepatic puncture of the right portal vein from a right hepatic venous approach 8. Additional portal venography and pressure measurements 9. TIPS creation 10. Post TIPS portal venography and pressure measurements Interventional Radiologist:  Criselda Peaches, MD  ANESTHESIA/SEDATION: General anesthesia provided by the anesthesiology service.  MEDICATIONS: None additional  FLUOROSCOPY TIME:  12 minutes 36 seconds  673.3 mGy  CONTRAST:  75mL OMNIPAQUE IOHEXOL 300 MG/ML  SOLN  TECHNIQUE: Informed consent was obtained from the patient following explanation of the procedure, risks, benefits and alternatives. The patient understands, agrees and consents for the procedure. All questions were addressed. A time out was performed.  Maximal barrier sterile technique utilized including caps, mask, sterile gowns, sterile gloves, large sterile drape, hand hygiene, and chlorhexidine skin prep.  The right chest and abdomen were interrogated with ultrasound. There is a very large right-sided pleural effusion with near total atelectasis of the right lung. Minimal perihepatic ascites is present. The volume of ascites is insignificant.  Under real-time sonographic guidance, a 5 French 10 cm Yueh centesis catheter was advanced into the right pleural space. Using Vacutainer bottles and standard technique, a total of 2.5 L of pleural fluid was removed during the course of the case. Intermittent imaging with fluoroscopy demonstrated successful re-expansion of the lung.  Attention was next turned to the liver. A segment of the anterior right portal vein was identified. Under real-time sonographic guidance,  this vessel was punctured using a [place] needle. A wire was advanced into the main portal vein. The each she will needle was removed and the Accustick sheath advanced into the main portal vein. The portal venography was performed. Flow is hepatopetal. Conventional portal venous anatomy. No significant varices.  Attention was now turned to the right neck. The right neck was interrogated with ultrasound. The internal jugular vein is widely patent and compressible. An image was obtained and stored for The record. a small dermatotomy was made. Under real-time sonographic guidance, the internal jugular vein was punctured with a 21 gauge micropuncture needle. Using standard technique, the initial micro wire was exchanged for a 0.035 wire using a transitional 5 [place] micro sheath.  The skin tract was dilated to 10 [place] and a Cook 10 [place] TIPS sheath was advanced into the right heart. Coaxially, an angled catheter was advanced over a Bentson wire. This catheter was used to select the right hepatic vein. The catheter was advanced into the right hepatic vein and venography confirmed the right hepatic venous anatomy.  The Bentson wire was exchanged for a superstiff Amplatz wire. The coal up into needle was advanced over the Amplatz wire. Using biplane and a single puncture, the right main portal vein was successfully punctured a road runner hydrophilic wire was advanced into the main portal vein. The coal open to needle was removed. A pigtail catheter was advanced over the wire and positioned within the main portal vein. Main portal venography was again performed. Again, no evidence of significant varices and excellent hepatopetal flow.  Pressure measurements were next obtained. The pressure in the main portal vein is 24 mm of mercury. The pressure in the free right hepatic vein is 13 mm of mercury. This yields a portosystemic gradient of 11 mm Hg.  The Amplatz wire was advanced into the splenic vein. The pigtail  catheter was removed. The transhepatic tract was pre dilated to 6 mm using a 6 x 4 mm must and balloon. The TIPS sheath was advanced over the [facility] balloon and into the portal vein. A 10 mm x 9 cm (7 mm covered, 2 cm uncovered) Viatorr stent graft was selected. This was advanced through the sheath. The uncovered portion of the stent was deployed in the main portal vein and pulled back to the main portal venous entry site. The remainder of the stent was un sheath and deployed. The stent was post dilated to 8 mm using an 8 x 4 Conquest balloon.  The pigtail catheter was reintroduced in the main portal vein over a wire. Portal venography demonstrates a well-positioned TIPS. There is still some hepatopetal flow into the main right and left portal veins. Flow is brisk. Pressure measurements were again obtained. The main portal venous pressure is 20 mm of mercury. The suprahepatic IVC pressure is 18 mm  of mercury. The portosystemic gradient is 2 mm of mercury.  The pigtail catheter was removed over a wire. The TIPS sheath was removed and hemostasis attained by manual pressure.  The Yueh centesis catheter was removed from the right pleural space. A sterile bandage was applied. The portal venous access catheter was carefully brought back under fluoroscopy to the portal venous puncture site. As this catheter was removed from the liver, the transhepatic tract was embolized with a Gel-Foam slurry.  The patient tolerated the procedure well and was extubated in the procedure room.  COMPLICATIONS: None  IMPRESSION: 1. Successful creation of a right hepatic to right portal venous TIPS using a 10 mm x 9 cm Viatorr stent graft which was post dilated to 8 mm. The portosystemic gradient decreased from 11 mm Hg pre TIPS to 2 mm Hg post TIPS. 2. Ultrasound-guided right thoracentesis with removal of 2.5 L pleural fluid.  PLAN: 1. Chest x-ray in an to assess reaccumulation of right-sided pleural effusion. 2. Monitor for signs of hepatic  encephalopathy. Will begin lactulose tonight.  Signed,  Criselda Peaches, MD  [facility]  [facility]   Electronically Signed   By: Jacqulynn Cadet M.D.   On: 07/16/2015 18:33       Assessment / Plan:   74. 59 year old female with ETOH cirrhosis / hepatic hydrothorax. She is s/p thoracentesis with TIPS two days ago. Creatinine bumped on higher dose of diuretics. Dose reduced a couple of days ago but creatinine still bumped overnight.   Principal Problem:   Pleural effusion associated with hepatic disorder Active Problems:   Acute respiratory distress   Alcoholic cirrhosis of liver with ascites   Recurrent right pleural effusion   Acute respiratory failure with hypoxia   Edema   SOB (shortness of breath)     LOS: 15 days   Tye Savoy  07/18/2015, 11:22 AM   Attending Addendum: I have taken an interval history, reviewed the chart, and examined the patient. I agree with the Advanced Practitioner's note and impression. 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 She has had a prolonged hospital stay for this issue as she did not respond to high dose diuretics over time. Now s/p thoracentesis with TIPS yesterday which she tolerated well and doing great, going home.  Now on aldactone 200mg  and lasix 40mg  BID from 300mg  / 120mg  previously. Given her renal function today, we may want to decrease lasix to 40mg  daily and aldactone to 100mg  daily and have her repeat BMP in a few days to ensure stable. Otherwise agree with IR to treat empirically with lactulose BID to prevent encephalopathy and goal titrate to 3 BMs per day. She has been abstaining from alcohol and is motivated to continue to do so. She has follow up with her hepatologist in 10 days to reassessment as she is a transplant candidate and to be evaluated for this moving forward.    [place], MD [facility] Pager [phone]

## 2015-07-18 NOTE — Discharge Summary (Signed)
Physician Discharge Summary   Patient ID: Savannah Jackson MRN: [id number] DOB/AGE: 05/09/1956 59 y.o.  Admit date: 07/03/2015 Discharge date: 07/18/2015  Primary Care Physician:  Rosita Fire, MD  Discharge Diagnoses:    . Pleural effusion associated with hepatic disorder/recurrent hepatic hydrothorax  . Acute respiratory distress . alcohol cirrhosis/ascites  . hyperkalemia  . acute kidney injury   Hypoxia on chronic respiratory insufficiency due to pleural effusion improved  Consults:  Interventional radiology Gastroenterology, Dr Havery Moros   Recommendations for Outpatient Follow-up:  Per interventional radiology follow up with Dr Laurence Ferrari in 2-4 weeks at [place] [facility] for TIPs [place] and exam  Please follow BMET in one week  TESTS THAT NEED FOLLOW-UP BMET   DIET: heart healthy    Allergies:   Allergies  Allergen Reactions  . Latex Swelling    Discoloration of skin.   . Vicodin [Hydrocodone-Acetaminophen] Itching     Discharge Medications:   Medication List    STOP taking these medications        lisinopril 20 MG tablet  Commonly known as:  PRINIVIL,ZESTRIL     metoprolol 50 MG tablet  Commonly known as:  LOPRESSOR     omeprazole 40 MG capsule  Commonly known as:  PRILOSEC     Potassium Chloride ER 20 MEQ Tbcr      TAKE these medications        benzonatate 100 MG capsule  Commonly known as:  TESSALON  Take 1 capsule (100 mg total) by mouth 3 (three) times daily as needed for cough.     folic acid 1 MG tablet  Commonly known as:  FOLVITE  Take 1 tablet (1 mg total) by mouth daily.     furosemide 40 MG tablet  Commonly known as:  LASIX  Take 1 tablet (40 mg total) by mouth daily.     lactulose 10 GM/15ML solution  Commonly known as:  CHRONULAC  Take 30 mLs (20 g total) by mouth 2 (two) times daily. Titrate for 2-3 BM's day     nadolol 20 MG tablet  Commonly known as:  CORGARD  Take 0.5 tablets (10 mg total) by mouth daily.      ondansetron 4 MG tablet  Commonly known as:  ZOFRAN  Take 4 mg by mouth every 8 (eight) hours as needed for nausea or vomiting.     oxyCODONE 5 MG immediate release tablet  Commonly known as:  Oxy IR/ROXICODONE  Take 1 tablet (5 mg total) by mouth 3 (three) times daily as needed for severe pain.     pantoprazole 40 MG tablet  Commonly known as:  PROTONIX  Take 1 tablet (40 mg total) by mouth daily.     spironolactone 100 MG tablet  Commonly known as:  ALDACTONE  Take 1 tablet (100 mg total) by mouth daily.     thiamine 100 MG tablet  Take 1 tablet (100 mg total) by mouth daily.     traMADol 50 MG tablet  Commonly known as:  ULTRAM  Take 1 tablet (50 mg total) by mouth every 8 (eight) hours as needed for moderate pain.         Brief H and P: For complete details please refer to admission H and P, but in brief Savannah Jackson is a 59 y.o. female with a history of alcoholic cirrhosis, hepatomegaly, diverticulosis, right pleural effusion status post for centesis every month 3 over the past 3 months. The patient presented with increasing shortness of breath which began  2-3 days after her last thoracentesis. Her symptoms worsened over the past few weeks before admission and worse when ambulating and improved at rest. In the emergency department, nasal cannula was applied which provided oxygen saturation mid 90s, however this worsens when ambulating and her oxygen level drops to high 80s. The patient was admitted for further workup.  Hospital Course:  Recurrent hepatic hydrothorax:  - Patient has been having weekly thoracentesis since June 2016. Interventional radiology was consulted, patient underwent thoracentesis on 9/4, cultures so far negative.  Echo showed grade 1 diastolic dysfunction, otherwise unremarkable. - Gastroenterology was also consulted and did not recommend repeat thoracentesis, recommended to monitor with diuresis. Patient was placed on high-dose Lasix and  spironolactone, O2 supplementation. Patient had a prolonged hospital stay for recurrent pleural effusion, hypoxia, she did not respond to high-dose diuretics over time. Hence interventional radiology was consulted. Patient underwent TIPS procedure on 9/16. Post TIPS procedure, the diuretics were titrated down to currently lasix to 40mg  daily and aldactone to 100mg  daily. Per GI, Continue lactulose for goal BM 2-3/day to prevent encephalopathy. Patient has follow up with her hepatologist in 10 days to reassess for further evaluation as she is a transplant candidate.    Alcohol cirrhosis/ascites: With history of portal hypertension and varices, prior GI bleed - No prior history of SBP. Currently stable, no encephalopathy. - Reported stopped drinking 4 months ago.  Hyperkalemia Potassium has improved after decreasing Aldactone.  Acute kidney injury - Creatinine currently stable at 1.5-1.6. Please note Lasix and Aldactone has been drastically reduced to 40 mg daily and 100 mg daily respectively.  Hypotension with history of hypertension -BP currently stable  Home medications including lisinopril and Lopressor were discontinued  Continue spironolactone and Lasix.  Nadolol was started on 9/9 per GI recommendations as tolerated with BP.  Hypoxia/chronic DOE/chronic respiratory insufficiency due to recurrent pleural effusion: due to recurrent refractory pleural effusion - Patient being successfully weaned.   Lower extremity edema: likely from cirrhosis - Doppler ultrasound negative for DVT   Day of Discharge BP 100/62 mmHg  Pulse 67  Temp(Src) 98.6 F (37 C) (Oral)  Resp 18  Ht 5\' 3"  (1.6 m)  Wt 65.03 kg (143 lb 5.8 oz)  BMI 25.40 kg/m2  SpO2 100%  Physical Exam: General: Alert and awake oriented x3 not in any acute distress. HEENT: anicteric sclera, pupils reactive to light and accommodation CVS: S1-S2 clear no murmur rubs or gallops Chest: clear to auscultation bilaterally, no  wheezing rales or rhonchi Abdomen: soft nontender, nondistended, normal bowel sounds Extremities: no cyanosis, clubbing, trace edema noted bilaterally Neuro: Cranial nerves II-XII intact, no focal neurological deficits   The results of significant diagnostics from this hospitalization (including imaging, microbiology, ancillary and laboratory) are listed below for reference.    LAB RESULTS: Basic Metabolic Panel:  Recent Labs Lab 07/17/15 0940 07/18/15 0509  NA 137 136  K 5.1 4.4  CL 106 109  CO2 23 21*  GLUCOSE 99 90  BUN 28* 25*  CREATININE 1.57* 1.62*  CALCIUM 8.3* 8.0*   Liver Function Tests:  Recent Labs Lab 07/14/15 0545 07/16/15 0724  AST 52* 51*  ALT 22 24  ALKPHOS 138* 137*  BILITOT 1.0 0.9  PROT 5.1* 5.7*  ALBUMIN 1.6* 1.7*   No results for input(s): LIPASE, AMYLASE in the last 168 hours. No results for input(s): AMMONIA in the last 168 hours. CBC:  Recent Labs Lab 07/17/15 0940 07/18/15 0509  WBC 16.8* 13.5*  HGB 10.6* 9.5*  HCT 33.1* 29.0*  MCV 80.5 80.1  PLT 171 158   Cardiac Enzymes: No results for input(s): CKTOTAL, CKMB, CKMBINDEX, TROPONINI in the last 168 hours. BNP: Invalid input(s): POCBNP CBG: No results for input(s): GLUCAP in the last 168 hours.  Significant Diagnostic Studies:  Dg Chest 1 View  07/04/2015   CLINICAL DATA:  Status post right thoracentesis.  EXAM: CHEST  1 VIEW  COMPARISON:  07/03/2015  FINDINGS: There is a moderate right pleural effusion which has decreased in size compared with 07/03/2015. There is no right pneumothorax. There is no left pleural effusion. There is no left pneumothorax. There is no focal consolidation. The heart and mediastinal contours are stable.  The osseous structures are unremarkable.  IMPRESSION: Moderate right pleural effusion without a pneumothorax.   Electronically Signed   By: Kathreen Devoid   On: 07/04/2015 13:32   Dg Chest 2 View  07/03/2015   CLINICAL DATA:  Shortness of breath and cough  for 1 week. Fever earlier in the week. Recent admission. Patient had fluid drawn from the right lung many times. Weakness.  EXAM: CHEST  2 VIEW  COMPARISON:  06/16/2015  FINDINGS: Moderate right-sided pleural effusion, significantly increased since prior study. Mediastinal shift towards the left. Heart size is probably normal. Minimal left basilar subsegmental atelectasis. Surgical clips are present in the upper abdomen.  IMPRESSION: Reaccumulation of right pleural fluid.  Minimal left lower lobe atelectasis.   Electronically Signed   By: Nolon Nations M.D.   On: 07/03/2015 14:32   US Thoracentesis Asp Pleural Space W/img Guide  07/04/2015   INDICATION: Cirrhosis, dyspnea, prior history of breast cancer, recurrent right pleural effusion/ probable right hepatic hydrothorax ; request is made for diagnostic and therapeutic right thoracentesis.  EXAM: ULTRASOUND GUIDED DIAGNOSTIC AND THERAPEUTIC RIGHT THORACENTESIS  COMPARISON:  Prior thoracentesis on 06/16/2015  MEDICATIONS: None  COMPLICATIONS: None immediate  TECHNIQUE: Informed written consent was obtained from the patient after a discussion of the risks, benefits and alternatives to treatment. A timeout was performed prior to the initiation of the procedure.  Initial ultrasound scanning demonstrates a large right pleural effusion. The lower chest was prepped and draped in the usual sterile fashion. 1% lidocaine was used for local anesthesia.  An ultrasound image was saved for documentation purposes. A 6 Fr Safe-T-Centesis catheter was introduced. The thoracentesis was performed. The catheter was removed and a dressing was applied. The patient tolerated the procedure well without immediate post procedural complication. The patient was escorted to have an upright chest radiograph.  FINDINGS: A total of approximately 2.2 liters of hazy, yellow fluid was removed. Requested samples were sent to the laboratory. Only the above amount of fluid was removed at this time  secondary to increased patient coughing.  IMPRESSION: Successful ultrasound-guided diagnostic and therapeutic right sided thoracentesis yielding 2.2 liters of pleural fluid.  Read by: Rowe Robert, PA-C   Electronically Signed   By: Jacqulynn Cadet M.D.   On: 07/04/2015 13:05    2D ECHO: Study Conclusions  - Left ventricle: The cavity size was normal. Systolic function was vigorous. The estimated ejection fraction was in the range of 65% to 70%. Wall motion was normal; there were no regional wall motion abnormalities. Doppler parameters are consistent with abnormal left ventricular relaxation (grade 1 diastolic dysfunction). - Mitral valve: Severely calcified annulus. - Tricuspid valve: There was moderate regurgitation. - Pulmonary arteries: Systolic pressure was mildly increased. PA peak pressure: 31 mm Hg (S). - Pericardium, extracardiac: There was a  large right pleural effusion with large echogenic crescent shaped mass, mobile but adhered to posterior aspect of left atrium. This large pleural effusion inhibits right atrial free wall resulting in diastolic invagination. There is respiratory varaition of tricuspid Doppler inflow.  Disposition and Follow-up:     Discharge Instructions    Diet - low sodium heart healthy    Complete by:  As directed      For home use only DME oxygen    Complete by:  As directed   Mode or (Route):  Nasal cannula  Liters per Minute:  2  Frequency:  Continuous (stationary and portable oxygen unit needed)  Oxygen delivery system:  Gas     Increase activity slowly    Complete by:  As directed             DISPOSITION: home    DISCHARGE FOLLOW-UP Follow-up Information    Follow up with [facility].   Why:  oxygen   Contact information:   [address] [place] [address] [phone]       Follow up with REHMAN,NAJEEB U, MD. Schedule an appointment as soon as possible for a visit in 10  days.   Specialty:  Gastroenterology   Why:  will need to be seen in office, obtain labs BMET   Contact information:   [place], [address] [place] [address] [phone]       Follow up with [facility], HEATH, MD In 3 weeks.   Specialties:  Interventional Radiology, Radiology   Why:  follow up appt with Dr Laurence Ferrari 2-4 weeks at clinic; [phone]; pt will hear feom scheduler   Contact information:   [address] [place] [address] [phone]        Time spent on Discharge: 35 mins   Signed:   RAI,RIPUDEEP M.D. [facility] 07/18/2015, 12:58 PM Pager: [phone]

## 2015-07-18 NOTE — Care Management Note (Signed)
Case Management Note  Patient Details  Name: MELENE SCHRAUBEN MRN: [id number] Date of Birth: [birth year]  Subjective/Objective:                   Shortness of breath Action/Plan: Discharge planning  Expected Discharge Date:                  Expected Discharge Plan:  Home/Self Care  In-House Referral:     Discharge planning Services  CM Consult  Post Acute Care Choice:  NA Choice offered to:     DME Arranged:  N/A DME Agency:  NA  HH Arranged:    [place] Agency:     Status of Service:  Completed, signed off  Medicare Important Message Given:  Yes-fourth notification given Date Medicare IM Given:    Medicare IM give by:    Date Additional Medicare IM Given:    Additional Medicare Important Message give by:     If discussed at [place] of Stay Meetings, dates discussed:    Additional Comments: CM received consult to please arrange home 02 but results of RN's ambulatory saturation trial reveals pt 02 sats remain in the in the 90s and pt does not qualify for home 02.  CM texted MD to make aware.   No other CM needs were communicated. Dellie Catholic, RN 07/18/2015, 11:44 AM

## 2015-07-19 ENCOUNTER — Encounter (HOSPITAL_COMMUNITY): Payer: Self-pay | Admitting: Radiology

## 2015-07-19 ENCOUNTER — Other Ambulatory Visit (INDEPENDENT_AMBULATORY_CARE_PROVIDER_SITE_OTHER): Payer: Self-pay | Admitting: Internal Medicine

## 2015-07-19 DIAGNOSIS — J90 Pleural effusion, not elsewhere classified: Secondary | ICD-10-CM

## 2015-07-19 DIAGNOSIS — K769 Liver disease, unspecified: Principal | ICD-10-CM

## 2015-07-19 DIAGNOSIS — K7031 Alcoholic cirrhosis of liver with ascites: Secondary | ICD-10-CM

## 2015-07-19 DIAGNOSIS — J918 Pleural effusion in other conditions classified elsewhere: Secondary | ICD-10-CM

## 2015-07-19 LAB — TYPE AND SCREEN
ABO/RH(D): O POS
Antibody Screen: NEGATIVE
UNIT DIVISION: 0
Unit division: 0

## 2015-07-24 ENCOUNTER — Telehealth: Payer: Self-pay | Admitting: Interventional Radiology

## 2015-07-24 NOTE — Telephone Encounter (Signed)
Savannah Savannah Jackson is a 59 year old female who has recently had a TIPS completed 07/16/2015.    She paged the [facility] on-call 07/24/2015 to speak to the physician.   I called Savannah Jackson at 2pm with no answer, and again at 4pm and was able to speak to her.    She was complaining that she was having what she described as "confusion", specifically forgetfulness. She confirmed that she is taking lactulose.    I asked that she either come to the [facility] ED for evaluation, or that she follow up with her GI physician to possibly adjust her medication.  She understands our recommendation and will come in for evaluation.    If she needs to call us back she has our on-call number.   Signed,  Dulcy Fanny. Earleen Newport, DO

## 2015-07-26 ENCOUNTER — Ambulatory Visit (INDEPENDENT_AMBULATORY_CARE_PROVIDER_SITE_OTHER): Payer: Commercial Managed Care - HMO | Admitting: Internal Medicine

## 2015-08-03 ENCOUNTER — Ambulatory Visit
Admission: RE | Admit: 2015-08-03 | Discharge: 2015-08-03 | Disposition: A | Discharge status: Home / Self Care | Payer: Commercial Managed Care - HMO | Source: Ambulatory Visit | Attending: Internal Medicine | Admitting: Internal Medicine

## 2015-08-03 ENCOUNTER — Ambulatory Visit
Admission: RE | Admit: 2015-08-03 | Discharge: 2015-08-03 | Disposition: A | Discharge status: Home / Self Care | Payer: Commercial Managed Care - HMO | Source: Ambulatory Visit | Attending: Interventional Radiology | Admitting: Interventional Radiology

## 2015-08-03 ENCOUNTER — Ambulatory Visit
Admission: RE | Admit: 2015-08-03 | Discharge: 2015-08-03 | Disposition: A | Discharge status: Home / Self Care | Payer: Commercial Managed Care - HMO | Source: Ambulatory Visit | Attending: Radiology | Admitting: Radiology

## 2015-08-03 ENCOUNTER — Other Ambulatory Visit: Payer: Self-pay | Admitting: *Deleted

## 2015-08-03 ENCOUNTER — Other Ambulatory Visit (HOSPITAL_COMMUNITY): Payer: Self-pay | Admitting: Interventional Radiology

## 2015-08-03 DIAGNOSIS — K769 Liver disease, unspecified: Secondary | ICD-10-CM | POA: Diagnosis not present

## 2015-08-03 DIAGNOSIS — J918 Pleural effusion in other conditions classified elsewhere: Secondary | ICD-10-CM

## 2015-08-03 DIAGNOSIS — J948 Other specified pleural conditions: Secondary | ICD-10-CM | POA: Diagnosis not present

## 2015-08-03 DIAGNOSIS — K7031 Alcoholic cirrhosis of liver with ascites: Secondary | ICD-10-CM

## 2015-08-03 DIAGNOSIS — J90 Pleural effusion, not elsewhere classified: Secondary | ICD-10-CM

## 2015-08-03 LAB — COMPLETE METABOLIC PANEL WITHOUT GFR
ALT: 30 U/L — ABNORMAL HIGH (ref 6–29)
AST: 62 U/L — ABNORMAL HIGH (ref 10–35)
Albumin: 1.7 g/dL — ABNORMAL LOW (ref 3.6–5.1)
Alkaline Phosphatase: 177 U/L — ABNORMAL HIGH (ref 33–130)
BUN: 19 mg/dL (ref 7–25)
CO2: 22 mmol/L (ref 20–31)
Calcium: 8 mg/dL — ABNORMAL LOW (ref 8.6–10.4)
Chloride: 109 mmol/L (ref 98–110)
Creat: 1.05 mg/dL (ref 0.50–1.05)
GFR, Est African American: 67 mL/min
GFR, Est Non African American: 58 mL/min — ABNORMAL LOW
Glucose, Bld: 80 mg/dL (ref 65–99)
Potassium: 4.5 mmol/L (ref 3.5–5.3)
Sodium: 134 mmol/L — ABNORMAL LOW (ref 135–146)
Total Bilirubin: 1.7 mg/dL — ABNORMAL HIGH (ref 0.2–1.2)
Total Protein: 5 g/dL — ABNORMAL LOW (ref 6.1–8.1)

## 2015-08-03 LAB — PROTIME-INR
INR: 1.62 — ABNORMAL HIGH (ref <1.50)
Prothrombin Time: 19.4 seconds — ABNORMAL HIGH (ref 11.6–15.2)

## 2015-08-03 LAB — CBC
HCT: 30.5 % — ABNORMAL LOW (ref 36.0–46.0)
Hemoglobin: 10.1 g/dL — ABNORMAL LOW (ref 12.0–15.0)
MCH: 24.6 pg — AB (ref 26.0–34.0)
MCHC: 33.1 g/dL (ref 30.0–36.0)
MCV: 74.4 fL — AB (ref 78.0–100.0)
MPV: 9.6 fL (ref 8.6–12.4)
PLATELETS: 155 10*3/uL (ref 150–400)
RBC: 4.1 MIL/uL (ref 3.87–5.11)
RDW: 16.4 % — ABNORMAL HIGH (ref 11.5–15.5)
WBC: 6.4 10*3/uL (ref 4.0–10.5)

## 2015-08-03 NOTE — Progress Notes (Signed)
Chief Complaint: Patient was seen in consultation today for  Chief Complaint  Patient presents with  . Follow-up    follow up TIPS       Referring Physician(s): Armbruster  History of Present Illness: Savannah Jackson is a 59 y.o. female who underwent a TIPS procedure on 07/16/2015.  She is doing better and states her "stomach is staying flat now".    She denies any SOB and states she doesn't feel like her "lung is filling up anymore".  Her only complaint is some confusion. She states she went to the [facility] yesterday and "got completely turned around".  She states "I tried to pay them $400 and they told me that wasn't right".  She states "my head gets really messed up if my husband argues with me".  Her daughter is with her today and confirms her confusion and states "she didn't know how to use the remote control", but Ms Savannah Jackson stated "no, I just misplaced the remote control".  She confirms she is taking her lactulose.  She is having 2-3 BMs per day.   Past Medical History  Diagnosis Date  . Hypertension   . Brain aneurysm   . Rotator cuff syndrome of left shoulder   . DDD (degenerative disc disease), lumbar   . Chronic back pain Diverticultis  . Cirrhosis     alcoholic  . Sleep apnea   . ETOH abuse   . Bilateral leg edema   . Hepatomegaly     scope 2014  . Diverticulosis     scope 2014  . Cancer     breast cancer    Past Surgical History  Procedure Laterality Date  . Cholecystectomy    . Brain surgery    . Breast surgery    . Total knee arthroplasty      right. 2002  . Cataract extraction Bilateral     [facility] 2 or 3 years ago  . Colonoscopy N/A 04/10/2013    Procedure: COLONOSCOPY;  Surgeon: Rogene Houston, MD;  [facility];  Service: Endoscopy;  Laterality: N/A;  1030-rescheduled to 1200 Ann notified pt  . Esophagogastroduodenoscopy N/A 04/22/2015    Procedure: ESOPHAGOGASTRODUODENOSCOPY (EGD);  Surgeon: Rogene Houston, MD;   Location: [facility];  Service: Endoscopy;  Laterality: N/A;  . Radiology with anesthesia N/A 07/16/2015    Procedure: TIPS;  Surgeon: Medication Radiologist, MD;  Location: [place];  Service: Radiology;  Laterality: N/A;    Allergies: Latex and Vicodin  Medications: Prior to Admission medications   Medication Sig Start Date End Date Taking? Authorizing Provider  furosemide (LASIX) 40 MG tablet Take 1 tablet (40 mg total) by mouth daily. 07/18/15  Yes Ripudeep Krystal Eaton, MD  lactulose (CHRONULAC) 10 GM/15ML solution Take 30 mLs (20 g total) by mouth 2 (two) times daily. Titrate for 2-3 BM's day 07/18/15  Yes Ripudeep K Rai, MD  nadolol (CORGARD) 20 MG tablet Take 0.5 tablets (10 mg total) by mouth daily. 07/18/15  Yes Ripudeep Krystal Eaton, MD  pantoprazole (PROTONIX) 40 MG tablet Take 1 tablet (40 mg total) by mouth daily. 04/29/15  Yes Rosita Fire, MD  rifaximin (XIFAXAN) 550 MG TABS tablet Take 550 mg by mouth 2 (two) times daily.   Yes Jacqulynn Cadet, MD  spironolactone (ALDACTONE) 100 MG tablet Take 1 tablet (100 mg total) by mouth daily. 07/18/15  Yes Ripudeep Krystal Eaton, MD  thiamine 100 MG tablet Take 1 tablet (100 mg total) by mouth  daily. 04/29/15  Yes Rosita Fire, MD  traMADol (ULTRAM) 50 MG tablet Take 1 tablet (50 mg total) by mouth every 8 (eight) hours as needed for moderate pain. 07/18/15  Yes Ripudeep Krystal Eaton, MD  benzonatate (TESSALON) 100 MG capsule Take 1 capsule (100 mg total) by mouth 3 (three) times daily as needed for cough. Patient not taking: Reported on 08/03/2015 07/18/15   Ripudeep Krystal Eaton, MD  folic acid (FOLVITE) 1 MG tablet Take 1 tablet (1 mg total) by mouth daily. Patient not taking: Reported on 08/03/2015 04/29/15   Rosita Fire, MD  ondansetron (ZOFRAN) 4 MG tablet Take 4 mg by mouth every 8 (eight) hours as needed for nausea or vomiting.    Historical Provider, MD  oxyCODONE (OXY IR/ROXICODONE) 5 MG immediate release tablet Take 1 tablet (5 mg total) by mouth 3 (three) times  daily as needed for severe pain. Patient not taking: Reported on 08/03/2015 06/15/15   Rogene Houston, MD     No family history on file.  Social History   Social History  . Marital Status: Married    Spouse Name: N/A  . Number of Children: N/A  . Years of Education: N/A   Social History Main Topics  . Smoking status: Never Smoker   . Smokeless tobacco: Never Used  . Alcohol Use: 0.0 oz/week    0 Standard drinks or equivalent per week     Comment: none since 03/2015  . Drug Use: No  . Sexual Activity: Yes    Birth Control/ Protection: None   Other Topics Concern  . 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 However he is fairly capable of taking care of himself at home. This couple have an attentive daughter who keeps an eye on both of them.    Review of Systems  Constitutional: Negative for fever, chills, activity change, appetite change and fatigue.  HENT: Negative.   Respiratory: Negative for cough, chest tightness and shortness of breath.   Cardiovascular: Positive for leg swelling. Negative for chest pain.  Gastrointestinal: Negative for nausea, vomiting, abdominal pain and abdominal distention.  Genitourinary: Negative.   Musculoskeletal: Negative for myalgias, arthralgias and gait problem.  Skin: Negative.   Neurological: Negative for dizziness, speech difficulty, light-headedness and numbness.       +confusion  Psychiatric/Behavioral: Negative.     Vital Signs: BP 112/70 mmHg  Pulse 45  Temp(Src) 98.2 F (36.8 C) (Oral)  Resp 14  Ht 5\' 3"  (1.6 m)  Wt 146 lb 9.6 oz (66.497 kg)  BMI 25.98 kg/m2  Physical Exam  Constitutional: She is oriented to person, place, and time. She appears well-developed and well-nourished.  HENT:  Head: Normocephalic and atraumatic.  Eyes: EOM are normal.  Neck: Normal range of motion. Neck supple.  Pulmonary/Chest: Effort normal and breath  sounds normal.  Breath sounds mildly decreased on the right, clear/normal on the left  Abdominal: Soft. Bowel sounds are normal. She exhibits no distension. There is no tenderness.  Musculoskeletal: Normal range of motion.  Neurological: She is alert and oriented to person, place, and time.  She is currently not having any confusion during my exam today.  Skin: Skin is warm and dry.  Psychiatric: She has a normal mood and affect. Her behavior is normal. Judgment and thought content normal.  Vitals reviewed.    Imaging: Dg Chest 1 View  07/16/2015   CLINICAL  DATA:  59 year old female with right-sided pleural effusion status post right thoracentesis.  EXAM: CHEST  1 VIEW  COMPARISON:  An earlier Radiograph dated 07/16/2015  FINDINGS: There has been interval decrease in the size of the right all pleural effusion. Aerated lung tissue is now seen in the right hemithorax. There is diffuse increased interstitial and airspace opacity throughout the right lung. The left lung is clear. Stable cardiac silhouette. No pneumothorax. There is degenerative changes of the spine and shoulder. No acute fracture.  IMPRESSION: Significant interval reduction in size of the right pleural effusion with aeration of the right lung.  Diffuse right lung interstitial and airspace opacity likely represents a combination of atelectasis/ edema. Pneumonia is not excluded. Follow-up recommended.  No pneumothorax.   Electronically Signed   By: Anner Crete M.D.   On: 07/16/2015 19:08   Dg Chest 1 View  07/04/2015   CLINICAL DATA:  Status post right thoracentesis.  EXAM: CHEST  1 VIEW  COMPARISON:  07/03/2015  FINDINGS: There is a moderate right pleural effusion which has decreased in size compared with 07/03/2015. There is no right pneumothorax. There is no left pleural effusion. There is no left pneumothorax. There is no focal consolidation. The heart and mediastinal contours are stable.  The osseous structures are unremarkable.   IMPRESSION: Moderate right pleural effusion without a pneumothorax.   Electronically Signed   By: Kathreen Devoid   On: 07/04/2015 13:32   Dg Chest 2 View  08/03/2015   CLINICAL DATA:  History of pleural effusions, cirrhosis, hypertension, tap status post TIPS procedure on September 16th.  EXAM: CHEST  2 VIEW  COMPARISON:  PA and lateral chest x-ray of July 17, 2015  FINDINGS: There remain small bilateral pleural effusions greater on the right than on the left. These blunt the costophrenic angles. The lungs are clear otherwise. The heart and pulmonary vascularity are normal. The mediastinum is normal in width. The bony thorax is unremarkable.  IMPRESSION: Persistent small bilateral pleural effusions slightly larger on the right than on the left. There has been improvement since the previous study.   Electronically Signed   By: David  Martinique M.D.   On: 08/03/2015 10:30   Dg Chest 2 View  07/17/2015   CLINICAL DATA:  Right pleural effusion.  EXAM: CHEST  2 VIEW  COMPARISON:  07/16/2015  FINDINGS: The left lung is clear. There is a small right pleural effusion. There is right lung volume loss most compatible with atelectasis. There is mild bilateral interstitial thickening, right greater than left. There is no pneumothorax. The heart and mediastinal contours are unremarkable.  There are surgical clips in the right axilla.  The osseous structures are unremarkable.  IMPRESSION: 1. Trace right pleural effusion. Right lung volume loss concerning for atelectasis. Mild bilateral interstitial prominence, right greater than left.   Electronically Signed   By: Kathreen Devoid   On: 07/17/2015 09:14   Dg Chest 2 View  07/16/2015   CLINICAL DATA:  Chest soreness.  Follow-up pleural effusion.  EXAM: CHEST  2 VIEW  COMPARISON:  07/07/2015  FINDINGS: Complete opacification of the right hemithorax. Left lung is clear. Heart is normal size. No acute bony abnormality.  IMPRESSION: Stable complete opacification of the right  hemithorax.   Electronically Signed   By: Rolm Baptise M.D.   On: 07/16/2015 08:06   Dg Chest 2 View  07/07/2015   CLINICAL DATA:  History right-sided pleural effusion and recent thoracentesis with increasing shortness of Breath  EXAM: CHEST - 2 VIEW  COMPARISON:  07/04/2015  FINDINGS: There is been significant increase in the right-sided pleural effusion with total opacification of the right hemi thorax. The left lung remains clear. The cardiac shadow is stable. The bony structures are unremarkable.  IMPRESSION: Increase in right-sided pleural effusion with opacification of the right hemi thorax.  These results will be called to the ordering clinician or representative by the Radiologist Assistant, and communication documented in the PACS or zVision Dashboard.   Electronically Signed   By: Inez Catalina M.D.   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  EXAM: TRANSJUGULAR INTRAHEPATIC PORTOSYSTEMIC SHUNT; IR THORACENTESIS ASP PLEURAL SPACE W/IMG GUIDE  Date: 07/16/2015  PROCEDURE: 1. Ultrasound-guided right thoracentesis 2. Ultrasound-guided transhepatic portal venous access 3. Ultrasound-guided puncture right internal jugular vein 4. Catheterization of the right hepatic vein 5. Right hepatic venogram 6. Portal venogram 7. Successful transhepatic puncture of the right portal vein from a right hepatic venous approach 8. Additional portal venography and pressure measurements 9. TIPS creation 10. Post TIPS portal venography and pressure measurements Interventional Radiologist:  Criselda Peaches, MD  ANESTHESIA/SEDATION: General anesthesia provided by the anesthesiology service.  MEDICATIONS: None additional  FLUOROSCOPY TIME:  12 minutes 36 seconds  673.3 mGy  CONTRAST:  80mL OMNIPAQUE IOHEXOL 300 MG/ML  SOLN  TECHNIQUE: Informed consent was obtained from the patient following explanation of the procedure,  risks, benefits and alternatives. The patient understands, agrees and consents for the procedure. All questions were addressed. A time out was performed.  Maximal barrier sterile technique utilized including caps, mask, sterile gowns, sterile gloves, large sterile drape, hand hygiene, and chlorhexidine skin prep.  The right chest and abdomen were interrogated with ultrasound. There is a very large right-sided pleural effusion with near total atelectasis of the right lung. Minimal perihepatic ascites is present. The volume of ascites is insignificant.  Under real-time sonographic guidance, a 5 French 10 cm Yueh centesis catheter was advanced into the right pleural space. Using Vacutainer bottles and standard technique, a total of 2.5 L of pleural fluid was removed during the course of the case. Intermittent imaging with fluoroscopy demonstrated successful re-expansion of the lung.  Attention was next turned to the liver. A segment of the anterior right portal vein was identified. Under real-time sonographic guidance, this vessel was punctured using a [place] needle. A wire was advanced into the main portal vein. The each she will needle was removed and the Accustick sheath advanced into the main portal vein. The portal venography was performed. Flow is hepatopetal. Conventional portal venous anatomy. No significant varices.  Attention was now turned to the right neck. The right neck was interrogated with ultrasound. The internal jugular vein is widely patent and compressible. An image was obtained and stored for The record. a small dermatotomy was made. Under real-time sonographic guidance, the internal jugular vein was punctured with a 21 gauge micropuncture needle. Using standard technique, the initial micro wire was exchanged for a 0.035 wire using a transitional 5 [place] micro sheath.  The skin tract was dilated to 10 [place] and a Cook 10 [place] TIPS sheath was advanced into the right heart. Coaxially, an  angled catheter was advanced over a Bentson wire. This catheter was used to select the right hepatic vein. The catheter was advanced into the right hepatic vein and venography confirmed the right hepatic venous anatomy.  The Bentson wire was exchanged for a superstiff Amplatz wire. The coal up into needle was advanced over the Amplatz wire. Using biplane and a single puncture, the right main portal vein was successfully punctured a road runner hydrophilic wire was advanced into the main portal vein. The coal open to needle was removed. A pigtail catheter was advanced over the wire and positioned within the main portal vein. Main portal venography was again performed. Again, no evidence of significant varices and excellent hepatopetal flow.  Pressure measurements were next obtained. The pressure in the main portal vein is 24 mm of mercury. The pressure in the free right hepatic vein is 13 mm of mercury. This yields a portosystemic gradient of 11 mm Hg.  The Amplatz wire was advanced into the splenic vein. The pigtail catheter was removed. The transhepatic tract was pre dilated to 6 mm using a 6 x 4 mm must and balloon. The TIPS sheath was advanced over the [facility] balloon and into the portal vein. A 10 mm x 9 cm (7 mm covered, 2 cm uncovered) Viatorr stent graft was selected. This was advanced through the sheath. The uncovered portion of the stent was deployed in the main portal vein and pulled back to the main portal venous entry site. The remainder of the stent was un sheath and deployed. The stent was post dilated to 8 mm using an 8 x 4 Conquest balloon.  The pigtail catheter was reintroduced in the main portal vein over a wire. Portal venography demonstrates a well-positioned TIPS. There is still some hepatopetal flow into the main right and left portal veins. Flow is brisk. Pressure measurements were again obtained. The main portal venous pressure is 20 mm of mercury. The suprahepatic IVC pressure is 18 mm of  mercury. The portosystemic gradient is 2 mm of mercury.  The pigtail catheter was removed over a wire. The TIPS sheath was removed and hemostasis attained by manual pressure.  The Yueh centesis catheter was removed from the right pleural space. A sterile bandage was applied. The portal venous access catheter was carefully brought back under fluoroscopy to the portal venous puncture site. As this catheter was removed from the liver, the transhepatic tract was embolized with a Gel-Foam slurry.  The patient tolerated the procedure well and was extubated in the procedure room.  COMPLICATIONS: None  IMPRESSION: 1. Successful creation of a right hepatic to right portal venous TIPS using a 10 mm x 9 cm Viatorr stent graft which was post dilated to 8 mm. The portosystemic gradient decreased from 11 mm Hg pre TIPS to 2 mm Hg post TIPS. 2. Ultrasound-guided right thoracentesis with removal of 2.5 L pleural fluid.  PLAN: 1. Chest x-ray in an to assess reaccumulation of right-sided pleural effusion. 2. Monitor for signs of hepatic encephalopathy. Will begin lactulose tonight.  Signed,  Criselda Peaches, MD  [facility]  [facility]   Electronically Signed   By: Jacqulynn Cadet M.D.   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  EXAM: TRANSJUGULAR INTRAHEPATIC PORTOSYSTEMIC SHUNT; IR THORACENTESIS ASP PLEURAL SPACE W/IMG GUIDE  Date: 07/16/2015  PROCEDURE: 1. Ultrasound-guided right thoracentesis 2. Ultrasound-guided transhepatic portal venous access 3. Ultrasound-guided puncture right internal jugular vein 4. Catheterization of the right hepatic vein 5. Right hepatic venogram 6. Portal venogram 7. Successful transhepatic puncture of the right portal vein  from a right hepatic venous approach 8. Additional portal venography and  pressure measurements 9. TIPS creation 10. Post TIPS portal venography and pressure measurements Interventional Radiologist:  Criselda Peaches, MD  ANESTHESIA/SEDATION: General anesthesia provided by the anesthesiology service.  MEDICATIONS: None additional  FLUOROSCOPY TIME:  12 minutes 36 seconds  673.3 mGy  CONTRAST:  15mL OMNIPAQUE IOHEXOL 300 MG/ML  SOLN  TECHNIQUE: Informed consent was obtained from the patient following explanation of the procedure, risks, benefits and alternatives. The patient understands, agrees and consents for the procedure. All questions were addressed. A time out was performed.  Maximal barrier sterile technique utilized including caps, mask, sterile gowns, sterile gloves, large sterile drape, hand hygiene, and chlorhexidine skin prep.  The right chest and abdomen were interrogated with ultrasound. There is a very large right-sided pleural effusion with near total atelectasis of the right lung. Minimal perihepatic ascites is present. The volume of ascites is insignificant.  Under real-time sonographic guidance, a 5 French 10 cm Yueh centesis catheter was advanced into the right pleural space. Using Vacutainer bottles and standard technique, a total of 2.5 L of pleural fluid was removed during the course of the case. Intermittent imaging with fluoroscopy demonstrated successful re-expansion of the lung.  Attention was next turned to the liver. A segment of the anterior right portal vein was identified. Under real-time sonographic guidance, this vessel was punctured using a [place] needle. A wire was advanced into the main portal vein. The each she will needle was removed and the Accustick sheath advanced into the main portal vein. The portal venography was performed. Flow is hepatopetal. Conventional portal venous anatomy. No significant varices.  Attention was now turned to the right neck. The right neck was interrogated with ultrasound. The internal jugular vein is widely  patent and compressible. An image was obtained and stored for The record. a small dermatotomy was made. Under real-time sonographic guidance, the internal jugular vein was punctured with a 21 gauge micropuncture needle. Using standard technique, the initial micro wire was exchanged for a 0.035 wire using a transitional 5 [place] micro sheath.  The skin tract was dilated to 10 [place] and a Cook 10 [place] TIPS sheath was advanced into the right heart. Coaxially, an angled catheter was advanced over a Bentson wire. This catheter was used to select the right hepatic vein. The catheter was advanced into the right hepatic vein and venography confirmed the right hepatic venous anatomy.  The Bentson wire was exchanged for a superstiff Amplatz wire. The coal up into needle was advanced over the Amplatz wire. Using biplane and a single puncture, the right main portal vein was successfully punctured a road runner hydrophilic wire was advanced into the main portal vein. The coal open to needle was removed. A pigtail catheter was advanced over the wire and positioned within the main portal vein. Main portal venography was again performed. Again, no evidence of significant varices and excellent hepatopetal flow.  Pressure measurements were next obtained. The pressure in the main portal vein is 24 mm of mercury. The pressure in the free right hepatic vein is 13 mm of mercury. This yields a portosystemic gradient of 11 mm Hg.  The Amplatz wire was advanced into the splenic vein. The pigtail catheter was removed. The transhepatic tract was pre dilated to 6 mm using a 6 x 4 mm must and balloon. The TIPS sheath was advanced over the [facility] balloon and into the portal vein. A 10 mm x 9 cm (  7 mm covered, 2 cm uncovered) Viatorr stent graft was selected. This was advanced through the sheath. The uncovered portion of the stent was deployed in the main portal vein and pulled back to the main portal venous entry site. The remainder of the  stent was un sheath and deployed. The stent was post dilated to 8 mm using an 8 x 4 Conquest balloon.  The pigtail catheter was reintroduced in the main portal vein over a wire. Portal venography demonstrates a well-positioned TIPS. There is still some hepatopetal flow into the main right and left portal veins. Flow is brisk. Pressure measurements were again obtained. The main portal venous pressure is 20 mm of mercury. The suprahepatic IVC pressure is 18 mm of mercury. The portosystemic gradient is 2 mm of mercury.  The pigtail catheter was removed over a wire. The TIPS sheath was removed and hemostasis attained by manual pressure.  The Yueh centesis catheter was removed from the right pleural space. A sterile bandage was applied. The portal venous access catheter was carefully brought back under fluoroscopy to the portal venous puncture site. As this catheter was removed from the liver, the transhepatic tract was embolized with a Gel-Foam slurry.  The patient tolerated the procedure well and was extubated in the procedure room.  COMPLICATIONS: None  IMPRESSION: 1. Successful creation of a right hepatic to right portal venous TIPS using a 10 mm x 9 cm Viatorr stent graft which was post dilated to 8 mm. The portosystemic gradient decreased from 11 mm Hg pre TIPS to 2 mm Hg post TIPS. 2. Ultrasound-guided right thoracentesis with removal of 2.5 L pleural fluid.  PLAN: 1. Chest x-ray in an to assess reaccumulation of right-sided pleural effusion. 2. Monitor for signs of hepatic encephalopathy. Will begin lactulose tonight.  Signed,  Criselda Peaches, MD  [facility]  [facility]   Electronically Signed   By: Jacqulynn Cadet M.D.   On: 07/16/2015 18:33    Labs:  CBC:  Recent Labs  07/06/15 0636 07/16/15 0724 07/17/15 0940 07/18/15 0509  WBC 10.1 9.0 16.8* 13.5*  HGB 11.3* 10.7* 10.6* 9.5*  HCT 33.0* 32.7* 33.1* 29.0*  PLT 127* 251 171 158     COAGS:  Recent Labs  07/03/15 1330 07/07/15 1406 07/09/15 0710 07/14/15 0545  INR 1.59* 1.54* 1.65* 1.45    BMP:  Recent Labs  07/15/15 0510 07/16/15 0724 07/17/15 0940 07/18/15 0509  NA 136 138 137 136  K 4.7 4.8 5.1 4.4  CL 104 107 106 109  CO2 26 25 23  21*  GLUCOSE 86 87 99 90  BUN 29* 27* 28* 25*  CALCIUM 8.5* 8.5* 8.3* 8.0*  CREATININE 1.23* 1.50* 1.57* 1.62*  GFRNONAA 47* 37* 35* 34*  GFRAA 55* 43* 41* 39*    LIVER FUNCTION TESTS:  Recent Labs  07/08/15 0811 07/09/15 0710 07/14/15 0545 07/16/15 0724  BILITOT 1.5* 1.4* 1.0 0.9  AST 67* 61* 52* 51*  ALT 23 25 22 24   ALKPHOS 144* 151* 138* 137*  PROT 5.3* 5.3* 5.1* 5.7*  ALBUMIN 1.7* 1.7* 1.6* 1.7*    TUMOR MARKERS: No results for input(s): AFPTM, CEA, CA199, CHROMGRNA in the last 8760 hours.  Assessment:  S/P TIPS 07/16/2015 with some signs of hepatic encephalopathy  Continue Lactulose  Will start Xifaxan to see if this helps with her confusion  Will obtain a CXR today to evaluate for pleural effusion.  Return in 3 months for routine follow up.  Signed:  Jaymarie Yeakel S Lynell Kussman PA-C 08/03/2015, 1:03 PM   Please refer to Dr. Katrinka Blazing attestation of this note for management and plan.

## 2015-08-05 ENCOUNTER — Telehealth: Payer: Self-pay | Admitting: *Deleted

## 2015-08-05 ENCOUNTER — Telehealth: Payer: Self-pay | Admitting: Radiology

## 2015-08-05 NOTE — Telephone Encounter (Signed)
Left a message for patient to call back. 

## 2015-08-05 NOTE — Telephone Encounter (Signed)
Savannah Jackson, Deflorio - 08/03/15 >','<< Less Detail',event)" href="javascript:;">More Detail >>   Manus Gunning, MD   Sent: Thu August 05, 2015 7:57 AM    To: Hulan Saas, RN        Message         [facility],    Can you contact this patient and ensure she is following up with her primary GI? I think she follows with a hepatologist but want to ensure. If she doesn't follow with anyone she can see me in clinic but she needs to see a hepatologist for transplant evaluation. Thanks            ----- Message -----     From: Jacqulynn Cadet, MD     Sent: 08/03/2015  2:55 PM      To: Amy Genia Harold, PA-C, Henri Gales, Therapist, sports, *

## 2015-08-05 NOTE — Telephone Encounter (Signed)
[employer] approval received for Xifaxin.  Price w/ approval [id number] (down from $2400).  [name] (patient's sister) called and stated that although a discount is available that the patient is unable to afford $698/month for this medication.  Dr Laurence Ferrari notified.  Per his instruction:  Patient to increase lactulose by one additional dose/day.  Will contact drug company for info regarding any additional discounts that may be available to assist the patient with the cost.  [name] called with instructions and states that she understands.  Dayvian Blixt Riki Rusk, RN 08/05/2015 4:55 PM

## 2015-08-06 ENCOUNTER — Other Ambulatory Visit (INDEPENDENT_AMBULATORY_CARE_PROVIDER_SITE_OTHER): Payer: Self-pay | Admitting: *Deleted

## 2015-08-06 MED ORDER — LACTULOSE 10 GM/15ML PO SOLN
20.0000 g | Freq: Two times a day (BID) | ORAL | Status: DC
Start: 2015-08-06 — End: 2015-09-01

## 2015-08-06 NOTE — Telephone Encounter (Signed)
Thanks for checking. While I am happy to see her, given her condition I think she should see her hepatologist for this issue, given she is a transplant candidate. Thanks

## 2015-08-06 NOTE — Telephone Encounter (Signed)
Medication e-scribed.

## 2015-08-06 NOTE — Telephone Encounter (Signed)
Patient states she just had a follow up with the "surgeon" yesterday and she is going to follow up with Primary GI. She does have a hepatologist also.

## 2015-08-09 NOTE — Progress Notes (Signed)
I forgot..thought she was ours now... She see Dr Laural Golden  Who is in [place], [place]

## 2015-08-10 ENCOUNTER — Ambulatory Visit (INDEPENDENT_AMBULATORY_CARE_PROVIDER_SITE_OTHER): Payer: Commercial Managed Care - HMO | Admitting: Internal Medicine

## 2015-08-10 ENCOUNTER — Encounter (INDEPENDENT_AMBULATORY_CARE_PROVIDER_SITE_OTHER): Payer: Self-pay | Admitting: Internal Medicine

## 2015-08-10 VITALS — BP 86/50 | HR 60 | Temp 98.2°F | Ht 63.0 in | Wt 150.4 lb

## 2015-08-10 DIAGNOSIS — K7031 Alcoholic cirrhosis of liver with ascites: Secondary | ICD-10-CM | POA: Diagnosis not present

## 2015-08-10 DIAGNOSIS — R110 Nausea: Secondary | ICD-10-CM | POA: Diagnosis not present

## 2015-08-10 DIAGNOSIS — K7030 Alcoholic cirrhosis of liver without ascites: Secondary | ICD-10-CM | POA: Diagnosis not present

## 2015-08-10 LAB — HEPATIC FUNCTION PANEL
ALK PHOS: 183 U/L — AB (ref 33–130)
ALT: 28 U/L (ref 6–29)
AST: 64 U/L — ABNORMAL HIGH (ref 10–35)
Albumin: 1.5 g/dL — ABNORMAL LOW (ref 3.6–5.1)
BILIRUBIN DIRECT: 0.4 mg/dL — AB (ref <=0.2)
BILIRUBIN INDIRECT: 1 mg/dL (ref 0.2–1.2)
BILIRUBIN TOTAL: 1.4 mg/dL — AB (ref 0.2–1.2)
Total Protein: 4.5 g/dL — ABNORMAL LOW (ref 6.1–8.1)

## 2015-08-10 MED ORDER — ONDANSETRON HCL 4 MG PO TABS: 4.0000 mg | ORAL_TABLET | Freq: Three times a day (TID) | ORAL | Status: DC | PRN

## 2015-08-10 NOTE — Progress Notes (Signed)
Subjective:    Patient ID: Savannah Jackson, female    DOB: 07/29/1956, 59 y.o.   MRN: [id number]  HPI Here today for f/u. Recently admitted to [facility] with acute respiratory failure, pleural effusion associated with hepatic disorder/recurreent hepatic hydrothorax. Hx of alcoholic cirrhosis,ascites. She has undergone multiple thoracentesis and her last was 07/16/2015. She underwent a TIPs procedure 07/16/2015. She tells me she is breathing okay.  There is abdominal distention.  Appetite is good.  In June her weight was 157. Today her weight is 150.4.  She has small amt of edema to her lower extremities. She is not drinking etoh now.  No etoh since June.  No confusion in the past week She did have some vomiting yesterday and Sunday.  No vomiting of blood.  She says she feels 100% better. She is having 3-4 stools a day.          Tips procedure 07/16/2015  CBC    Component Value Date/Time   WBC 6.4 08/03/2015 1054   RBC 4.10 08/03/2015 1054   HGB 10.1* 08/03/2015 1054   HCT 30.5* 08/03/2015 1054   PLT 155 08/03/2015 1054   MCV 74.4* 08/03/2015 1054   MCH 24.6* 08/03/2015 1054   MCHC 33.1 08/03/2015 1054   RDW 16.4* 08/03/2015 1054   LYMPHSABS 2.3 07/03/2015 1330   MONOABS 2.0* 07/03/2015 1330   EOSABS 0.3 07/03/2015 1330   BASOSABS 0.2* 07/03/2015 1330    Hepatic Function Panel     Component Value Date/Time   PROT 5.0* 08/03/2015 1054   ALBUMIN 1.7* 08/03/2015 1054   AST 62* 08/03/2015 1054   ALT 30* 08/03/2015 1054   ALKPHOS 177* 08/03/2015 1054   BILITOT 1.7* 08/03/2015 1054   BILIDIR 0.5 04/29/2015 0608   IBILI 1.1* 04/29/2015 0608       Review of Systems      Past Medical History  Diagnosis Date  . Hypertension   . Brain aneurysm   . Rotator cuff syndrome of left shoulder   . DDD (degenerative disc disease), lumbar   . Chronic back pain Diverticultis  . Cirrhosis ([place])     alcoholic  . Sleep apnea   . ETOH abuse   . Bilateral leg edema   .  Hepatomegaly     scope 2014  . Diverticulosis     scope 2014  . Cancer [facility])     breast cancer    Past Surgical History  Procedure Laterality Date  . Cholecystectomy    . Brain surgery    . Breast surgery    . Total knee arthroplasty      right. 2002  . Cataract extraction Bilateral     [facility] 2 or 3 years ago  . Colonoscopy N/A 04/10/2013    Procedure: COLONOSCOPY;  Surgeon: Rogene Houston, MD;  [facility];  Service: Endoscopy;  Laterality: N/A;  1030-rescheduled to 1200 Ann notified pt  . Esophagogastroduodenoscopy N/A 04/22/2015    Procedure: ESOPHAGOGASTRODUODENOSCOPY (EGD);  Surgeon: Rogene Houston, MD;  [facility];  Service: Endoscopy;  Laterality: N/A;  . Radiology with anesthesia N/A 07/16/2015    Procedure: TIPS;  Surgeon: Medication Radiologist, MD;  Location: [place];  Service: Radiology;  Laterality: N/A;    Allergies  Allergen Reactions  . Latex Swelling    Discoloration of skin.   . Vicodin [Hydrocodone-Acetaminophen] Itching    Current Outpatient Prescriptions on File Prior to Visit  Medication Sig Dispense Refill  . benzonatate (TESSALON)  100 MG capsule Take 1 capsule (100 mg total) by mouth 3 (three) times daily as needed for cough. 20 capsule 0  . folic acid (FOLVITE) 1 MG tablet Take 1 tablet (1 mg total) by mouth daily. 30 tablet 3  . furosemide (LASIX) 40 MG tablet Take 1 tablet (40 mg total) by mouth daily. 30 tablet 3  . lactulose (CHRONULAC) 10 GM/15ML solution Take 30 mLs (20 g total) by mouth 2 (two) times daily. Titrate for 2-3 BM's day (Patient taking differently: Take 20 g by mouth 3 (three) times daily. Four times a day) 240 mL 1  . nadolol (CORGARD) 20 MG tablet Take 0.5 tablets (10 mg total) by mouth daily. 30 tablet 3  . pantoprazole (PROTONIX) 40 MG tablet Take 1 tablet (40 mg total) by mouth daily. 30 tablet 3  . spironolactone (ALDACTONE) 100 MG tablet Take 1 tablet (100 mg total) by mouth daily. 30 tablet 3  . thiamine  100 MG tablet Take 1 tablet (100 mg total) by mouth daily. 30 tablet 3  . traMADol (ULTRAM) 50 MG tablet Take 1 tablet (50 mg total) by mouth every 8 (eight) hours as needed for moderate pain. 30 tablet 0  . oxyCODONE (OXY IR/ROXICODONE) 5 MG immediate release tablet Take 1 tablet (5 mg total) by mouth 3 (three) times daily as needed for severe pain. (Patient not taking: Reported on 08/03/2015) 40 tablet 0  . rifaximin (XIFAXAN) 550 MG TABS tablet Take 550 mg by mouth 2 (two) times daily.     No current facility-administered medications on file prior to visit.    +    Objective:   Physical Exam  Blood pressure 86/50, pulse 60, temperature 98.2 F (36.8 C), height 5\' 3"  (1.6 m), weight 150 lb 6.4 oz (68.221 kg). Alert and oriented. Skin warm and dry. Oral mucosa is moist.   . Sclera anicteric, conjunctivae is pink. Thyroid not enlarged. No cervical lymphadenopathy. Lungs clear. Heart regular rate and rhythm.  Abdomen is soft. Bowel sounds are positive. No hepatomegaly. No abdominal masses felt. No tenderness.  2+ edema to lower extremities.            Assessment & Plan:  Alcoholic cirrhosis post Tips procedure. Am going to get a CBC, Hepatic function, and ammonia today. Decrease Metoprolol to once a day due to low blood pressure OV 2 mos.

## 2015-08-10 NOTE — Patient Instructions (Addendum)
OV in 2 months. Take Metoprolol once a day instead of 2 times.

## 2015-08-30 ENCOUNTER — Emergency Department (HOSPITAL_COMMUNITY): Payer: Commercial Managed Care - HMO

## 2015-08-30 ENCOUNTER — Inpatient Hospital Stay (HOSPITAL_COMMUNITY)
Admission: EM | Admit: 2015-08-30 | Discharge: 2015-09-01 | DRG: 443 | Disposition: A | Discharge status: Home / Self Care | Payer: Commercial Managed Care - HMO | Attending: Internal Medicine | Admitting: Internal Medicine

## 2015-08-30 ENCOUNTER — Encounter (HOSPITAL_COMMUNITY): Payer: Self-pay

## 2015-08-30 DIAGNOSIS — Z853 Personal history of malignant neoplasm of breast: Secondary | ICD-10-CM

## 2015-08-30 DIAGNOSIS — D6959 Other secondary thrombocytopenia: Secondary | ICD-10-CM | POA: Diagnosis present

## 2015-08-30 DIAGNOSIS — I10 Essential (primary) hypertension: Secondary | ICD-10-CM | POA: Diagnosis not present

## 2015-08-30 DIAGNOSIS — Z96651 Presence of right artificial knee joint: Secondary | ICD-10-CM | POA: Diagnosis not present

## 2015-08-30 DIAGNOSIS — R1084 Generalized abdominal pain: Secondary | ICD-10-CM

## 2015-08-30 DIAGNOSIS — R4182 Altered mental status, unspecified: Secondary | ICD-10-CM | POA: Diagnosis not present

## 2015-08-30 DIAGNOSIS — K7682 Hepatic encephalopathy: Secondary | ICD-10-CM

## 2015-08-30 DIAGNOSIS — F4489 Other dissociative and conversion disorders: Secondary | ICD-10-CM | POA: Diagnosis not present

## 2015-08-30 DIAGNOSIS — D649 Anemia, unspecified: Secondary | ICD-10-CM | POA: Diagnosis present

## 2015-08-30 DIAGNOSIS — R402411 Glasgow coma scale score 13-15, in the field [EMT or ambulance]: Secondary | ICD-10-CM | POA: Diagnosis not present

## 2015-08-30 DIAGNOSIS — I159 Secondary hypertension, unspecified: Secondary | ICD-10-CM

## 2015-08-30 DIAGNOSIS — K7290 Hepatic failure, unspecified without coma: Secondary | ICD-10-CM | POA: Diagnosis not present

## 2015-08-30 DIAGNOSIS — K7460 Unspecified cirrhosis of liver: Secondary | ICD-10-CM | POA: Diagnosis not present

## 2015-08-30 DIAGNOSIS — N183 Chronic kidney disease, stage 3 unspecified: Secondary | ICD-10-CM

## 2015-08-30 DIAGNOSIS — F0391 Unspecified dementia with behavioral disturbance: Secondary | ICD-10-CM | POA: Diagnosis not present

## 2015-08-30 DIAGNOSIS — G4733 Obstructive sleep apnea (adult) (pediatric): Secondary | ICD-10-CM | POA: Diagnosis present

## 2015-08-30 DIAGNOSIS — K7030 Alcoholic cirrhosis of liver without ascites: Secondary | ICD-10-CM | POA: Diagnosis present

## 2015-08-30 DIAGNOSIS — R531 Weakness: Secondary | ICD-10-CM | POA: Diagnosis not present

## 2015-08-30 LAB — CBC WITH DIFFERENTIAL/PLATELET
BASOS ABS: 0.2 10*3/uL — AB (ref 0.0–0.1)
Basophils Relative: 3 %
EOS ABS: 0.2 10*3/uL (ref 0.0–0.7)
EOS PCT: 3 %
HCT: 29.8 % — ABNORMAL LOW (ref 36.0–46.0)
HEMOGLOBIN: 9.8 g/dL — AB (ref 12.0–15.0)
LYMPHS PCT: 31 %
Lymphs Abs: 1.8 10*3/uL (ref 0.7–4.0)
MCH: 24.9 pg — ABNORMAL LOW (ref 26.0–34.0)
MCHC: 32.9 g/dL (ref 30.0–36.0)
MCV: 75.8 fL — ABNORMAL LOW (ref 78.0–100.0)
Monocytes Absolute: 0.9 10*3/uL (ref 0.1–1.0)
Monocytes Relative: 15 %
NEUTROS PCT: 48 %
Neutro Abs: 2.7 10*3/uL (ref 1.7–7.7)
PLATELETS: 139 10*3/uL — AB (ref 150–400)
RBC: 3.93 MIL/uL (ref 3.87–5.11)
RDW: 17.1 % — ABNORMAL HIGH (ref 11.5–15.5)
WBC: 5.6 10*3/uL (ref 4.0–10.5)

## 2015-08-30 LAB — COMPREHENSIVE METABOLIC PANEL
ALT: 34 U/L (ref 14–54)
ANION GAP: 4 — AB (ref 5–15)
AST: 83 U/L — ABNORMAL HIGH (ref 15–41)
Albumin: 1.6 g/dL — ABNORMAL LOW (ref 3.5–5.0)
Alkaline Phosphatase: 205 U/L — ABNORMAL HIGH (ref 38–126)
BUN: 22 mg/dL — AB (ref 6–20)
CHLORIDE: 116 mmol/L — AB (ref 101–111)
CO2: 18 mmol/L — ABNORMAL LOW (ref 22–32)
Calcium: 8.2 mg/dL — ABNORMAL LOW (ref 8.9–10.3)
Creatinine, Ser: 1.26 mg/dL — ABNORMAL HIGH (ref 0.44–1.00)
GFR, EST AFRICAN AMERICAN: 53 mL/min — AB (ref >60)
GFR, EST NON AFRICAN AMERICAN: 46 mL/min — AB (ref >60)
GLUCOSE: 99 mg/dL (ref 65–99)
POTASSIUM: 4.9 mmol/L (ref 3.5–5.1)
Sodium: 138 mmol/L (ref 135–145)
TOTAL PROTEIN: 5.4 g/dL — AB (ref 6.5–8.1)
Total Bilirubin: 1.9 mg/dL — ABNORMAL HIGH (ref 0.3–1.2)

## 2015-08-30 LAB — URINALYSIS, ROUTINE W REFLEX MICROSCOPIC
Bilirubin Urine: NEGATIVE
GLUCOSE, UA: NEGATIVE mg/dL
Ketones, ur: NEGATIVE mg/dL
Nitrite: NEGATIVE
PH: 6.5 (ref 5.0–8.0)
Protein, ur: 100 mg/dL — AB
SPECIFIC GRAVITY, URINE: 1.01 (ref 1.005–1.030)
Urobilinogen, UA: 0.2 mg/dL (ref 0.0–1.0)

## 2015-08-30 LAB — RAPID URINE DRUG SCREEN, HOSP PERFORMED
Amphetamines: NOT DETECTED
BENZODIAZEPINES: NOT DETECTED
Barbiturates: NOT DETECTED
COCAINE: NOT DETECTED
Opiates: NOT DETECTED
Tetrahydrocannabinol: NOT DETECTED

## 2015-08-30 LAB — I-STAT TROPONIN, ED: Troponin i, poc: 0.02 ng/mL (ref 0.00–0.08)

## 2015-08-30 LAB — URINE MICROSCOPIC-ADD ON

## 2015-08-30 LAB — ACETAMINOPHEN LEVEL

## 2015-08-30 LAB — I-STAT CG4 LACTIC ACID, ED: LACTIC ACID, VENOUS: 1.65 mmol/L (ref 0.5–2.0)

## 2015-08-30 LAB — SALICYLATE LEVEL

## 2015-08-30 LAB — ETHANOL

## 2015-08-30 LAB — LIPASE, BLOOD: Lipase: 74 U/L — ABNORMAL HIGH (ref 11–51)

## 2015-08-30 MED ORDER — RIFAXIMIN 550 MG PO TABS
550.0000 mg | ORAL_TABLET | Freq: Two times a day (BID) | ORAL | Status: DC
  Administered 2015-08-31 – 2015-09-01 (×4): 550 mg via ORAL
  Filled 2015-08-30 (×4): qty 1

## 2015-08-30 MED ORDER — LACTULOSE 10 GM/15ML PO SOLN
20.0000 g | Freq: Three times a day (TID) | ORAL | Status: DC
  Administered 2015-08-31 – 2015-09-01 (×5): 20 g via ORAL
  Filled 2015-08-30 (×5): qty 30

## 2015-08-30 MED ORDER — FOLIC ACID 1 MG PO TABS
1.0000 mg | ORAL_TABLET | Freq: Every day | ORAL | Status: DC
  Administered 2015-08-31 – 2015-09-01 (×2): 1 mg via ORAL
  Filled 2015-08-30 (×2): qty 1

## 2015-08-30 MED ORDER — ENSURE ENLIVE PO LIQD
237.0000 mL | Freq: Two times a day (BID) | ORAL | Status: DC
  Administered 2015-08-31 – 2015-09-01 (×3): 237 mL via ORAL

## 2015-08-30 MED ORDER — FUROSEMIDE 40 MG PO TABS
40.0000 mg | ORAL_TABLET | Freq: Every day | ORAL | Status: DC
  Administered 2015-08-31 – 2015-09-01 (×2): 40 mg via ORAL
  Filled 2015-08-30 (×2): qty 1

## 2015-08-30 MED ORDER — LORAZEPAM 2 MG/ML IJ SOLN
0.5000 mg | Freq: Once | INTRAMUSCULAR | Status: AC
  Administered 2015-08-30: 0.5 mg via INTRAVENOUS
  Filled 2015-08-30: qty 1

## 2015-08-30 MED ORDER — ONDANSETRON HCL 4 MG/2ML IJ SOLN: 4.0000 mg | Freq: Four times a day (QID) | INTRAMUSCULAR | Status: DC | PRN

## 2015-08-30 MED ORDER — SPIRONOLACTONE 100 MG PO TABS
100.0000 mg | ORAL_TABLET | Freq: Every day | ORAL | Status: DC
  Administered 2015-08-31 – 2015-09-01 (×2): 100 mg via ORAL
  Filled 2015-08-30 (×2): qty 1

## 2015-08-30 MED ORDER — PANTOPRAZOLE SODIUM 40 MG PO TBEC
40.0000 mg | DELAYED_RELEASE_TABLET | Freq: Every day | ORAL | Status: DC
  Administered 2015-08-31 – 2015-09-01 (×2): 40 mg via ORAL
  Filled 2015-08-30 (×2): qty 1

## 2015-08-30 MED ORDER — PANTOPRAZOLE SODIUM 40 MG PO TBEC: 40.0000 mg | DELAYED_RELEASE_TABLET | Freq: Every day | ORAL | Status: DC

## 2015-08-30 MED ORDER — BENZONATATE 100 MG PO CAPS: 100.0000 mg | ORAL_CAPSULE | Freq: Three times a day (TID) | ORAL | Status: DC | PRN

## 2015-08-30 MED ORDER — ONDANSETRON HCL 4 MG PO TABS: 4.0000 mg | ORAL_TABLET | Freq: Four times a day (QID) | ORAL | Status: DC | PRN

## 2015-08-30 MED ORDER — HALOPERIDOL LACTATE 5 MG/ML IJ SOLN: 5.0000 mg | Freq: Once | INTRAMUSCULAR | Status: DC

## 2015-08-30 MED ORDER — FUROSEMIDE 40 MG PO TABS: 40.0000 mg | ORAL_TABLET | Freq: Every day | ORAL | Status: DC

## 2015-08-30 MED ORDER — SODIUM CHLORIDE 0.9 % IV SOLN
INTRAVENOUS | Status: DC
  Administered 2015-08-31: 75 mL/h via INTRAVENOUS
  Administered 2015-08-31: 23:00:00 via INTRAVENOUS

## 2015-08-30 MED ORDER — HALOPERIDOL LACTATE 5 MG/ML IJ SOLN
2.0000 mg | Freq: Once | INTRAMUSCULAR | Status: AC
Start: 2015-08-30 — End: 2015-08-30
  Administered 2015-08-30: 2 mg via INTRAVENOUS
  Filled 2015-08-30: qty 1

## 2015-08-30 MED ORDER — ONDANSETRON HCL 4 MG PO TABS: 4.0000 mg | ORAL_TABLET | Freq: Three times a day (TID) | ORAL | Status: DC | PRN

## 2015-08-30 MED ORDER — TRAMADOL HCL 50 MG PO TABS
50.0000 mg | ORAL_TABLET | Freq: Three times a day (TID) | ORAL | Status: DC | PRN
  Administered 2015-09-01: 50 mg via ORAL
  Filled 2015-08-30: qty 1

## 2015-08-30 MED ORDER — METOPROLOL SUCCINATE ER 50 MG PO TB24
50.0000 mg | ORAL_TABLET | Freq: Two times a day (BID) | ORAL | Status: DC
  Administered 2015-08-31 – 2015-09-01 (×3): 50 mg via ORAL
  Filled 2015-08-30 (×3): qty 1

## 2015-08-30 MED ORDER — LACTULOSE 10 GM/15ML PO SOLN
30.0000 g | Freq: Once | ORAL | Status: DC
  Filled 2015-08-30: qty 60

## 2015-08-30 MED ORDER — VITAMIN B-1 100 MG PO TABS
100.0000 mg | ORAL_TABLET | Freq: Every day | ORAL | Status: DC
  Administered 2015-08-31 – 2015-09-01 (×2): 100 mg via ORAL
  Filled 2015-08-30 (×2): qty 1

## 2015-08-30 MED ORDER — NADOLOL 20 MG PO TABS
10.0000 mg | ORAL_TABLET | Freq: Every day | ORAL | Status: DC
  Administered 2015-08-31 – 2015-09-01 (×2): 10 mg via ORAL
  Filled 2015-08-30 (×5): qty 1

## 2015-08-30 NOTE — ED Notes (Signed)
Attempted to call report to floor- receiving nurse unavailable

## 2015-08-30 NOTE — ED Notes (Signed)
Pt here for evaluation of AMS.

## 2015-08-30 NOTE — Progress Notes (Signed)
GOLD HOOP EARRINGS REMOVED FROM PATIENT'S EARS FOR EXAM, EARRINGS PLACED IN PLASTIC BAGGY AND GIVEN TO TRANSPORTER TO GIVE TO DAUGHTER THAT IS IN THE ROOM WITH PATIENT.

## 2015-08-30 NOTE — ED Notes (Signed)
Pt absolutely refusing to take lactulose-- swallowed some H20 prior to attempts to administer lactulose without any problems

## 2015-08-30 NOTE — ED Provider Notes (Signed)
CSN: [id number]     Arrival date & time 08/30/15  1227 History   First MD Initiated Contact with Patient 08/30/15 1324     Chief Complaint  Patient presents with  . Altered Mental Status     (Consider location/radiation/quality/duration/timing/severity/associated sxs/prior Treatment) HPI Comments: 59 year old female with past medical history including cirrhosis, alcohol abuse, OSA, chronic back pain who presents with altered mental status. History limited due to the patient's altered mentation and obtained primarily from the patient's daughter. Daughter reports that the patient has been out of all of her medications for the past one week and has not been able to refill them due to cost. She noticed some mild confusion 2 days ago but today she has been very confused at home. Daughter called EMS because of her altered mentation. She is unaware of any recent fevers, vomiting, or other illness.  LEVEL 5 CAVEAT APPLIES 2/2 AMS  Patient is a 59 y.o. female presenting with altered mental status. The history is provided by a relative.  Altered Mental Status   Past Medical History  Diagnosis Date  . Hypertension   . Brain aneurysm   . Rotator cuff syndrome of left shoulder   . DDD (degenerative disc disease), lumbar   . Chronic back pain Diverticultis  . Cirrhosis ([place])     alcoholic  . Sleep apnea   . ETOH abuse   . Bilateral leg edema   . Hepatomegaly     scope 2014  . Diverticulosis     scope 2014  . Cancer [facility])     breast cancer   Past Surgical History  Procedure Laterality Date  . Cholecystectomy    . Brain surgery    . Breast surgery    . Total knee arthroplasty      right. 2002  . Cataract extraction Bilateral     [facility] 2 or 3 years ago  . Colonoscopy N/A 04/10/2013    Procedure: COLONOSCOPY;  Surgeon: Rogene Houston, MD;  [facility];  Service: Endoscopy;  Laterality: N/A;  1030-rescheduled to 1200 Ann notified pt  . Esophagogastroduodenoscopy N/A 04/22/2015     Procedure: ESOPHAGOGASTRODUODENOSCOPY (EGD);  Surgeon: Rogene Houston, MD;  [facility];  Service: Endoscopy;  Laterality: N/A;  . Radiology with anesthesia N/A 07/16/2015    Procedure: TIPS;  Surgeon: Medication Radiologist, MD;  Location: [place];  Service: Radiology;  Laterality: N/A;   No family history on file. Social History  Substance Use Topics  . Smoking status: Never Smoker   . Smokeless tobacco: Never Used  . Alcohol Use: 0.0 oz/week    0 Standard drinks or equivalent per week     Comment: none since 03/2015   OB History    Gravida Para Term Preterm AB TAB SAB Ectopic Multiple Living            1     Review of Systems  Unable to perform ROS: Mental status change      Allergies  Latex and Vicodin  Home Medications   Prior to Admission medications   Medication Sig Start Date End Date Taking? Authorizing Provider  benzonatate (TESSALON) 100 MG capsule Take 1 capsule (100 mg total) by mouth 3 (three) times daily as needed for cough. 07/18/15   Ripudeep Krystal Eaton, MD  folic acid (FOLVITE) 1 MG tablet Take 1 tablet (1 mg total) by mouth daily. 04/29/15   Rosita Fire, MD  furosemide (LASIX) 40 MG tablet Take 1 tablet (40  mg total) by mouth daily. 07/18/15   Ripudeep Krystal Eaton, MD  lactulose (CHRONULAC) 10 GM/15ML solution Take 30 mLs (20 g total) by mouth 2 (two) times daily. Titrate for 2-3 BM's day Patient taking differently: Take 20 g by mouth 3 (three) times daily. Four times a day 08/06/15   Rogene Houston, MD  metoprolol succinate (TOPROL-XL) 50 MG 24 hr tablet Take 50 mg by mouth 2 (two) times daily. Take with or immediately following a meal.    Historical Provider, MD  nadolol (CORGARD) 20 MG tablet Take 0.5 tablets (10 mg total) by mouth daily. 07/18/15   Ripudeep Krystal Eaton, MD  ondansetron (ZOFRAN) 4 MG tablet Take 1 tablet (4 mg total) by mouth every 8 (eight) hours as needed for nausea or vomiting. 08/10/15   Butch Penny, NP  oxyCODONE (OXY IR/ROXICODONE) 5 MG  immediate release tablet Take 1 tablet (5 mg total) by mouth 3 (three) times daily as needed for severe pain. Patient not taking: Reported on 08/03/2015 06/15/15   Rogene Houston, MD  pantoprazole (PROTONIX) 40 MG tablet Take 1 tablet (40 mg total) by mouth daily. 04/29/15   Rosita Fire, MD  rifaximin (XIFAXAN) 550 MG TABS tablet Take 550 mg by mouth 2 (two) times daily.    Jacqulynn Cadet, MD  spironolactone (ALDACTONE) 100 MG tablet Take 1 tablet (100 mg total) by mouth daily. 07/18/15   Ripudeep Krystal Eaton, MD  thiamine 100 MG tablet Take 1 tablet (100 mg total) by mouth daily. 04/29/15   Rosita Fire, MD  traMADol (ULTRAM) 50 MG tablet Take 1 tablet (50 mg total) by mouth every 8 (eight) hours as needed for moderate pain. 07/18/15   Ripudeep K Rai, MD   BP 135/83 mmHg  Pulse 72  Temp(Src) 98.4 F (36.9 C) (Oral)  Resp 18  SpO2 100% Physical Exam  Constitutional: She appears well-developed and well-nourished.  Agitated, trying to get out of bed  HENT:  Head: Normocephalic and atraumatic.  Moist mucous membranes  Eyes: Pupils are equal, round, and reactive to light.  Mild scleral icterus  Neck: Neck supple.  Cardiovascular: Normal rate, regular rhythm and normal heart sounds.   No murmur heard. Pulmonary/Chest: Effort normal and breath sounds normal.  Abdominal: Soft. Bowel sounds are normal. She exhibits no distension. There is no tenderness.  Musculoskeletal:  2+ pitting edema b/l LE  Neurological: She is alert.  Disoriented, keeps trying to get out of bed, able to ambulate w/ assistance; + asterixis  Skin: Skin is warm and dry. No rash noted.  Nursing note and vitals reviewed.   ED Course  .Critical Care Performed by: Sharlett Iles Authorized by: Sharlett Iles Total critical care time: 40 minutes Critical care was necessary to treat or prevent imminent or life-threatening deterioration of the following conditions: hepatic failure and CNS failure or  compromise. Critical care was time spent personally by me on the following activities: development of treatment plan with patient or surrogate, evaluation of patient's response to treatment, examination of patient, obtaining history from patient or surrogate, ordering and performing treatments and interventions, ordering and review of laboratory studies, ordering and review of radiographic studies, re-evaluation of patient's condition and review of old charts.   (including critical care time) Labs Review Labs Reviewed  COMPREHENSIVE METABOLIC PANEL - Abnormal; Notable for the following:    Chloride 116 (*)    CO2 18 (*)    BUN 22 (*)    Creatinine, Ser 1.26 (*)  Calcium 8.2 (*)    Total Protein 5.4 (*)    Albumin 1.6 (*)    AST 83 (*)    Alkaline Phosphatase 205 (*)    Total Bilirubin 1.9 (*)    GFR calc non Af Amer 46 (*)    GFR calc Af Amer 53 (*)    Anion gap 4 (*)    All other components within normal limits  LIPASE, BLOOD - Abnormal; Notable for the following:    Lipase 74 (*)    All other components within normal limits  CBC WITH DIFFERENTIAL/PLATELET - Abnormal; Notable for the following:    Hemoglobin 9.8 (*)    HCT 29.8 (*)    MCV 75.8 (*)    MCH 24.9 (*)    RDW 17.1 (*)    Platelets 139 (*)    Basophils Absolute 0.2 (*)    All other components within normal limits  ACETAMINOPHEN LEVEL - Abnormal; Notable for the following:    Acetaminophen (Tylenol), Serum <10 (*)    All other components within normal limits  URINALYSIS, ROUTINE W REFLEX MICROSCOPIC (NOT AT [facility]) - Abnormal; Notable for the following:    Hgb urine dipstick MODERATE (*)    Protein, ur 100 (*)    Leukocytes, UA SMALL (*)    All other components within normal limits  URINE MICROSCOPIC-ADD ON - Abnormal; Notable for the following:    Squamous Epithelial / LPF FEW (*)    Bacteria, UA MANY (*)    All other components within normal limits  ETHANOL  SALICYLATE LEVEL  URINE RAPID DRUG SCREEN, HOSP  PERFORMED  COMPREHENSIVE METABOLIC PANEL  CBC  PROTIME-INR  I-STAT CG4 LACTIC ACID, ED  I-STAT TROPOININ, ED    Imaging Review Ct Head Wo Contrast  08/30/2015  CLINICAL DATA:  Altered mental status. Reported history of a surgery for brain aneurysm. EXAM: CT HEAD WITHOUT CONTRAST TECHNIQUE: Contiguous axial images were obtained from the base of the skull through the vertex without intravenous contrast. COMPARISON:  None. FINDINGS: Postsurgical changes are noted from prior right frontal craniotomy. Aneurysm clips are noted in the anterior right basilar cistern just to the right of midline. No evidence of parenchymal hemorrhage or extra-axial fluid collection. No mass lesion, mass effect, or midline shift. No CT evidence of acute infarction. There is somewhat prominent symmetric periventricular white matter hypodensity. Cerebral volume is age appropriate. No ventriculomegaly. The visualized paranasal sinuses are essentially clear. The mastoid air cells are unopacified. No evidence of calvarial fracture. IMPRESSION: 1. No acute intracranial hemorrhage. 2. Nonspecific prominent periventricular white matter hypodensity, possibly due to chronic small vessel ischemia, although other etiologies such as demyelination should be considered as these changes are prominent for age. Correlate with brain MRI as clinically warranted. 3. Postsurgical changes from prior right frontal craniotomy with aneurysm clips in the anterior right basilar cistern. Electronically Signed   By: Ilona Sorrel M.D.   On: 08/30/2015 15:10   I have personally reviewed and evaluated these lab results as part of my medical decision-making.   EKG Interpretation   Date/Time:  Monday August 30 2015 13:50:32 EDT Ventricular Rate:  69 PR Interval:  169 QRS Duration: 99 QT Interval:  444 QTC Calculation: 476 R Axis:   27 Text Interpretation:  Sinus rhythm Abnormal R-wave progression, early  transition Probable LVH with secondary repol  abnrm Abnormal T, probable  ischemia, widespread T wave inversions in precordial leads more prominent  than previosu but similar to EKG on 09/08/08 Confirmed  by Hawken Bielby MD,  Apolonio Schneiders [phone]) on 08/30/2015 2:07:30 PM     Medications  lactulose (CHRONULAC) 10 GM/15ML solution 30 g (not administered)  LORazepam (ATIVAN) injection 0.5 mg (not administered)    MDM   Final diagnoses:  Altered mental status, unspecified altered mental status type  Alcoholic cirrhosis of liver without ascites ([place])    59yo F w/ cirrhosis who p/w AMS noticed by daughter today. She has not taken her medications including lactulose for 1 week. Patient agitated, alert but trying to get out of bed on exam. Had to make multiple attempts to redirect and keep patient calm. Asterixis noted. Patient afebrile with stable vital signs. No abdominal distention or tenderness noted. Obtained above labs as well as EKG and head CT.  EKG showed more prominent T-wave inversions compared to previous but otherwise no other changes. Labs notable for creatinine 1.26, CO2 18, AST 83, alkaline phosphatase 205, bilirubin 1.9, hemoglobin 9.8. These labs are only slightly worse than previous labs. Tylenol and salicylate negative. UA still pending. CT showed chronic small vessel ischemia versus demyelinating process. Given no clear time of onset and patient's likely hepatic encephalopathy, I have deferred MRI until patient is more stable. Ordered lactulose but patient refused to take it by mouth despite giving ativan and haldol.  Given the patient has not had lactulose in one week and has no external signs of trauma and no fever, I suspect hepatic encephalopathy as the cause of her altered mentation. I discussed presentation w/ hospitalist and patient admitted to general medicine for further care.  Sharlett Iles, MD 08/30/15 2130

## 2015-08-30 NOTE — H&P (Signed)
[facility] History and Physical  Savannah Jackson [id number] DOB: 07-24-1956 DOA: 08/30/2015  Referring physician: ER PCP: Rosita Fire, MD   Chief Complaint: Altered mental status  HPI: Savannah Jackson is a 59 y.o. female  This is a 59 year old lady who is known to have a history of alcohol cirrhosis of the liver, status post TIPS procedure in September of this year, now presents with mild confusion which started 2 days ago and much worsening confusion today. According to the daughter, who is at the bedside, she has not been able to afford her medications for the last week and has been without them. The patient herself has now received Ativan and haloperidol in the emergency room and is sleepy/drowsy and will not be able to respond to my questions. She is now being admitted for further management.   Review of Systems:  Unable to obtain secondary to the patient's altered mental status.  Past Medical History  Diagnosis Date  . Hypertension   . Brain aneurysm   . Rotator cuff syndrome of left shoulder   . DDD (degenerative disc disease), lumbar   . Chronic back pain Diverticultis  . Cirrhosis ([place])     alcoholic  . Sleep apnea   . ETOH abuse   . Bilateral leg edema   . Hepatomegaly     scope 2014  . Diverticulosis     scope 2014  . Cancer [facility])     breast cancer   Past Surgical History  Procedure Laterality Date  . Cholecystectomy    . Brain surgery    . Breast surgery    . Total knee arthroplasty      right. 2002  . Cataract extraction Bilateral     [facility] 2 or 3 years ago  . Colonoscopy N/A 04/10/2013    Procedure: COLONOSCOPY;  Surgeon: Rogene Houston, MD;  [facility];  Service: Endoscopy;  Laterality: N/A;  1030-rescheduled to 1200 Ann notified pt  . Esophagogastroduodenoscopy N/A 04/22/2015    Procedure: ESOPHAGOGASTRODUODENOSCOPY (EGD);  Surgeon: Rogene Houston, MD;  [facility];  Service: Endoscopy;  Laterality: N/A;  .  Radiology with anesthesia N/A 07/16/2015    Procedure: TIPS;  Surgeon: Medication Radiologist, MD;  Location: [place];  Service: Radiology;  Laterality: N/A;   Social History:  reports that she has never smoked. She has never used smokeless tobacco. She reports that she drinks alcohol. She reports that she does not use illicit drugs.  Allergies  Allergen Reactions  . Latex Swelling    Discoloration of skin.   . Vicodin [Hydrocodone-Acetaminophen] Itching     Family history: Unable to obtain secondary to the patient's altered mental status.   Prior to Admission medications   Medication Sig Start Date End Date Taking? Authorizing Provider  benzonatate (TESSALON) 100 MG capsule Take 1 capsule (100 mg total) by mouth 3 (three) times daily as needed for cough. 07/18/15  Yes Ripudeep Krystal Eaton, MD  folic acid (FOLVITE) 1 MG tablet Take 1 tablet (1 mg total) by mouth daily. 04/29/15  Yes Rosita Fire, MD  furosemide (LASIX) 40 MG tablet Take 1 tablet (40 mg total) by mouth daily. 07/18/15  Yes Ripudeep Krystal Eaton, MD  lactulose (CHRONULAC) 10 GM/15ML solution Take 30 mLs (20 g total) by mouth 2 (two) times daily. Titrate for 2-3 BM's day Patient taking differently: Take 20 g by mouth 3 (three) times daily. Four times a day 08/06/15  Yes Rogene Houston, MD  nadolol (  CORGARD) 20 MG tablet Take 0.5 tablets (10 mg total) by mouth daily. 07/18/15  Yes Ripudeep Krystal Eaton, MD  ondansetron (ZOFRAN) 4 MG tablet Take 1 tablet (4 mg total) by mouth every 8 (eight) hours as needed for nausea or vomiting. 08/10/15  Yes Butch Penny, NP  spironolactone (ALDACTONE) 100 MG tablet Take 1 tablet (100 mg total) by mouth daily. 07/18/15  Yes Ripudeep Krystal Eaton, MD  thiamine 100 MG tablet Take 1 tablet (100 mg total) by mouth daily. 04/29/15  Yes Rosita Fire, MD  traMADol (ULTRAM) 50 MG tablet Take 1 tablet (50 mg total) by mouth every 8 (eight) hours as needed for moderate pain. 07/18/15  Yes Ripudeep Krystal Eaton, MD  metoprolol succinate  (TOPROL-XL) 50 MG 24 hr tablet Take 50 mg by mouth 2 (two) times daily. Take with or immediately following a meal.    Historical Provider, MD  oxyCODONE (OXY IR/ROXICODONE) 5 MG immediate release tablet Take 1 tablet (5 mg total) by mouth 3 (three) times daily as needed for severe pain. Patient taking differently: Take 5 mg by mouth 3 (three) times daily as needed for moderate pain or severe pain.  06/15/15   Rogene Houston, MD  pantoprazole (PROTONIX) 40 MG tablet Take 1 tablet (40 mg total) by mouth daily. 04/29/15   Rosita Fire, MD  rifaximin (XIFAXAN) 550 MG TABS tablet Take 550 mg by mouth 2 (two) times daily.    Jacqulynn Cadet, MD   Physical Exam: Filed Vitals:   08/30/15 1230 08/30/15 1430 08/30/15 1511 08/30/15 1530  BP: 135/83 122/106 144/86 139/86  Pulse: 72 77    Temp: 98.4 F (36.9 C)     TempSrc: Oral     Resp: 18 14 21 15   SpO2: 100% 100%      Wt Readings from Last 3 Encounters:  08/10/15 68.221 kg (150 lb 6.4 oz)  08/03/15 66.497 kg (146 lb 9.6 oz)  07/18/15 65.03 kg (143 lb 5.8 oz)    General:  Appears drowsy, does respond to painful stimuli. Eyes: PERRL, normal lids, irises & conjunctiva ENT: grossly normal hearing, lips & tongue Neck: no LAD, masses or thyromegaly Cardiovascular: RRR, no m/r/g. No LE edema. Telemetry: SR, no arrhythmias  Respiratory: CTA bilaterally, no w/r/r. Normal respiratory effort. Abdomen: soft, ntnd Skin: no rash or induration seen on limited exam Musculoskeletal: grossly normal tone BUE/BLE Psychiatric: grossly normal mood and affect, speech fluent and appropriate Neurologic: grossly non-focal.          Labs on Admission:  Basic Metabolic Panel:  Recent Labs Lab 08/30/15 1344  NA 138  K 4.9  CL 116*  CO2 18*  GLUCOSE 99  BUN 22*  CREATININE 1.26*  CALCIUM 8.2*   Liver Function Tests:  Recent Labs Lab 08/30/15 1344  AST 83*  ALT 34  ALKPHOS 205*  BILITOT 1.9*  PROT 5.4*  ALBUMIN 1.6*    Recent Labs Lab  08/30/15 1344  LIPASE 74*   No results for input(s): AMMONIA in the last 168 hours. CBC:  Recent Labs Lab 08/30/15 1344  WBC 5.6  NEUTROABS 2.7  HGB 9.8*  HCT 29.8*  MCV 75.8*  PLT 139*   Cardiac Enzymes: No results for input(s): CKTOTAL, CKMB, CKMBINDEX, TROPONINI in the last 168 hours.  BNP (last 3 results)  Recent Labs  07/03/15 1330  BNP 241.0*    ProBNP (last 3 results) No results for input(s): PROBNP in the last 8760 hours.  CBG: No results for input(s):  GLUCAP in the last 168 hours.  Radiological Exams on Admission: Ct Head Wo Contrast  08/30/2015  CLINICAL DATA:  Altered mental status. Reported history of a surgery for brain aneurysm. EXAM: CT HEAD WITHOUT CONTRAST TECHNIQUE: Contiguous axial images were obtained from the base of the skull through the vertex without intravenous contrast. COMPARISON:  None. FINDINGS: Postsurgical changes are noted from prior right frontal craniotomy. Aneurysm clips are noted in the anterior right basilar cistern just to the right of midline. No evidence of parenchymal hemorrhage or extra-axial fluid collection. No mass lesion, mass effect, or midline shift. No CT evidence of acute infarction. There is somewhat prominent symmetric periventricular white matter hypodensity. Cerebral volume is age appropriate. No ventriculomegaly. The visualized paranasal sinuses are essentially clear. The mastoid air cells are unopacified. No evidence of calvarial fracture. IMPRESSION: 1. No acute intracranial hemorrhage. 2. Nonspecific prominent periventricular white matter hypodensity, possibly due to chronic small vessel ischemia, although other etiologies such as demyelination should be considered as these changes are prominent for age. Correlate with brain MRI as clinically warranted. 3. Postsurgical changes from prior right frontal craniotomy with aneurysm clips in the anterior right basilar cistern. Electronically Signed   By: Ilona Sorrel M.D.   On:  08/30/2015 15:10      Assessment/Plan   1. Altered mental status. This is likely due to hepatic encephalopathy. Currently she is more drowsy secondary to medication that has been given for her agitation, namely Ativan and haloperidol. 2. Hepatic encephalopathy. She will be treated with oral lactulose, when she is more awake. IV fluids. Gastroenterology consultation. Monitor liver function especially INR  She'll be admitted to the medical floor. Further recommendations will depend on patient's hospital progress.   Code Status: Full code.  DVT Prophylaxis: SCDs.  Family Communication: I discussed the plan with the patient's daughter at the bedside.  Disposition Plan: Home when medically stable.  Time spent: 60 minutes.  Doree Albee [facility] Pager [phone].

## 2015-08-31 ENCOUNTER — Encounter: Payer: Self-pay | Admitting: *Deleted

## 2015-08-31 DIAGNOSIS — R4182 Altered mental status, unspecified: Secondary | ICD-10-CM

## 2015-08-31 DIAGNOSIS — R1084 Generalized abdominal pain: Secondary | ICD-10-CM

## 2015-08-31 DIAGNOSIS — N183 Chronic kidney disease, stage 3 (moderate): Secondary | ICD-10-CM

## 2015-08-31 DIAGNOSIS — K7030 Alcoholic cirrhosis of liver without ascites: Secondary | ICD-10-CM

## 2015-08-31 LAB — COMPREHENSIVE METABOLIC PANEL
ALBUMIN: 1.3 g/dL — AB (ref 3.5–5.0)
ALK PHOS: 137 U/L — AB (ref 38–126)
ALT: 28 U/L (ref 14–54)
ANION GAP: 3 — AB (ref 5–15)
AST: 71 U/L — AB (ref 15–41)
BILIRUBIN TOTAL: 2.1 mg/dL — AB (ref 0.3–1.2)
BUN: 21 mg/dL — AB (ref 6–20)
CO2: 21 mmol/L — AB (ref 22–32)
Calcium: 8.2 mg/dL — ABNORMAL LOW (ref 8.9–10.3)
Chloride: 119 mmol/L — ABNORMAL HIGH (ref 101–111)
Creatinine, Ser: 1.16 mg/dL — ABNORMAL HIGH (ref 0.44–1.00)
GFR calc Af Amer: 59 mL/min — ABNORMAL LOW (ref >60)
GFR calc non Af Amer: 50 mL/min — ABNORMAL LOW (ref >60)
GLUCOSE: 83 mg/dL (ref 65–99)
POTASSIUM: 4.5 mmol/L (ref 3.5–5.1)
SODIUM: 143 mmol/L (ref 135–145)
Total Protein: 4.6 g/dL — ABNORMAL LOW (ref 6.5–8.1)

## 2015-08-31 LAB — CBC
HEMATOCRIT: 26.7 % — AB (ref 36.0–46.0)
HEMOGLOBIN: 8.8 g/dL — AB (ref 12.0–15.0)
MCH: 24.8 pg — AB (ref 26.0–34.0)
MCHC: 33 g/dL (ref 30.0–36.0)
MCV: 75.2 fL — ABNORMAL LOW (ref 78.0–100.0)
Platelets: 150 10*3/uL (ref 150–400)
RBC: 3.55 MIL/uL — ABNORMAL LOW (ref 3.87–5.11)
RDW: 17.1 % — AB (ref 11.5–15.5)
WBC: 5.6 10*3/uL (ref 4.0–10.5)

## 2015-08-31 LAB — PROTIME-INR
INR: 1.62 — ABNORMAL HIGH (ref 0.00–1.49)
Prothrombin Time: 19.3 seconds — ABNORMAL HIGH (ref 11.6–15.2)

## 2015-08-31 LAB — AMMONIA: Ammonia: 67 umol/L — ABNORMAL HIGH (ref 9–35)

## 2015-08-31 MED ORDER — ALBUMIN HUMAN 25 % IV SOLN
25.0000 g | Freq: Once | INTRAVENOUS | Status: AC
  Administered 2015-08-31: 25 g via INTRAVENOUS
  Filled 2015-08-31: qty 100

## 2015-08-31 NOTE — Care Management Note (Signed)
Case Management Note  Patient Details  Name: Savannah Jackson MRN: [id number] Date of Birth: 1955/12/17  Subjective/Objective:                  Pt admitted from home with hepatic encephalopathy. Pt lives with her husband and will return home at discharge. Pt is independent with ADl's.  Action/Plan: No CM needs noted. Cm did explain the importance of getting medication refilled and taking medications as prescribed. Pt and pts family verbalized understanding.  Expected Discharge Date:                  Expected Discharge Plan:  Home/Self Care  In-House Referral:  NA  Discharge planning Services  CM Consult  Post Acute Care Choice:  NA Choice offered to:  NA  DME Arranged:    DME Agency:     HH Arranged:    HH Agency:     Status of Service:  Completed, signed off  Medicare Important Message Given:    Date Medicare IM Given:    Medicare IM give by:    Date Additional Medicare IM Given:    Additional Medicare Important Message give by:     If discussed at [place] of Stay Meetings, dates discussed:    Additional Comments:  Joylene Draft, RN 08/31/2015, 12:32 PM

## 2015-08-31 NOTE — Patient Outreach (Signed)
[facility]) Care Management  08/31/2015  Savannah Jackson [birth year] [id number]   Referral from Burgess Amor, RN to assign Pharmacy and Community RN, assigned Deanne Coffer, PharmD and Jacqlyn Larsen, RN.  Thanks, Ronnell Freshwater. [place], [place] Assistant Phone: [phone] Fax: [phone]

## 2015-08-31 NOTE — Consult Note (Signed)
Referring Provider: Rosita Fire, MD  Primary Care Physician:  Rosita Fire, MD Primary Gastroenterologist:  Dr. Laural Golden  Reason for Consultation:    Hepatic encephalopathy.  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 She was discharged after 2 week hospitalization. She has history of hepatic encephalopathy diagnosed about 4 months ago. She was discharged on lactulose and Xifaxan. She could not afford Xifaxan because of high co-pay. She continued lactulose. She was seen in the office 3 weeks ago and was doing well. Patient ran out of her medications about a week ago and she was doing fine until yesterday morning when her daughter [name] found her sitting on a commode and confused. She was brought to emergency room where she was evaluated. Head CT was negative for acute abnormalities. She was felt to have hepatic encephalopathy and was hospitalized and begun on her medications. This afternoon she feels much better. She knows she is in the hospital and her daughter states she is able to recognize her. Patient denies melena or rectal bleeding abdominal pain nausea or vomiting. There is also no history of fever chills or productive cough. She has not had any alcohol in over 4 months.   Past Medical History  Diagnosis Date  . Hypertension   . Brain aneurysm   . Rotator cuff syndrome of left shoulder   . DDD (degenerative disc disease), lumbar   . Chronic back pain Diverticultis  . Cirrhosis ([place])     alcoholic  . Sleep apnea   . ETOH abuse   . Bilateral leg edema   . Hepatomegaly     scope 2014  . Diverticulosis     scope 2014  . Cancer [facility])     breast cancer    Past Surgical History  Procedure Laterality Date  . Cholecystectomy    . Brain surgery    . Breast surgery    . Total knee arthroplasty      right. 2002   . Cataract extraction Bilateral     [facility] 2 or 3 years ago  . Colonoscopy N/A 04/10/2013    Procedure: COLONOSCOPY;  Surgeon: Rogene Houston, MD;  [facility];  Service: Endoscopy;  Laterality: N/A;  1030-rescheduled to 1200 Ann notified pt  . Esophagogastroduodenoscopy N/A 04/22/2015    Procedure: ESOPHAGOGASTRODUODENOSCOPY (EGD);  Surgeon: Rogene Houston, MD;  [facility];  Service: Endoscopy;  Laterality: N/A;  . Radiology with anesthesia N/A 07/16/2015    Procedure: TIPS;  Surgeon: Medication Radiologist, MD;  Location: [place];  Service: Radiology;  Laterality: N/A;    Prior to Admission medications   Medication Sig Start Date End Date Taking? Authorizing Provider  benzonatate (TESSALON) 100 MG capsule Take 1 capsule (100 mg total) by mouth 3 (three) times daily as needed for cough. 07/18/15  Yes Ripudeep Krystal Eaton, MD  folic acid (FOLVITE) 1 MG tablet Take 1 tablet (1 mg total) by mouth daily. 04/29/15  Yes Rosita Fire, MD  furosemide (LASIX) 40 MG tablet Take 1 tablet (40 mg total) by mouth daily. 07/18/15  Yes Ripudeep Krystal Eaton, MD  lactulose (CHRONULAC) 10 GM/15ML solution Take 30 mLs (20 g total) by mouth 2 (two) times daily. Titrate for 2-3 BM's day Patient taking differently: Take 20 g by mouth 3 (three) times daily. Four times a day 08/06/15  Yes Rogene Houston, MD  nadolol (CORGARD)  20 MG tablet Take 0.5 tablets (10 mg total) by mouth daily. 07/18/15  Yes Ripudeep Krystal Eaton, MD  ondansetron (ZOFRAN) 4 MG tablet Take 1 tablet (4 mg total) by mouth every 8 (eight) hours as needed for nausea or vomiting. 08/10/15  Yes Butch Penny, NP  spironolactone (ALDACTONE) 100 MG tablet Take 1 tablet (100 mg total) by mouth daily. 07/18/15  Yes Ripudeep Krystal Eaton, MD  thiamine 100 MG tablet Take 1 tablet (100 mg total) by mouth daily. 04/29/15  Yes Rosita Fire, MD  traMADol (ULTRAM) 50 MG tablet Take 1 tablet (50 mg total) by mouth every 8 (eight) hours as needed for moderate pain. 07/18/15   Yes Ripudeep Krystal Eaton, MD  metoprolol succinate (TOPROL-XL) 50 MG 24 hr tablet Take 50 mg by mouth 2 (two) times daily. Take with or immediately following a meal.    Historical Provider, MD  oxyCODONE (OXY IR/ROXICODONE) 5 MG immediate release tablet Take 1 tablet (5 mg total) by mouth 3 (three) times daily as needed for severe pain. Patient taking differently: Take 5 mg by mouth 3 (three) times daily as needed for moderate pain or severe pain.  06/15/15   Rogene Houston, MD  pantoprazole (PROTONIX) 40 MG tablet Take 1 tablet (40 mg total) by mouth daily. 04/29/15   Rosita Fire, MD  rifaximin (XIFAXAN) 550 MG TABS tablet Take 550 mg by mouth 2 (two) times daily.    Jacqulynn Cadet, MD    Current Facility-Administered Medications  Medication Dose Route Frequency Provider Last Rate Last Dose  . 0.9 %  sodium chloride infusion   Intravenous Continuous Doree Albee, MD 75 mL/hr at 08/31/15 0702 75 mL/hr at 08/31/15 0702  . benzonatate (TESSALON) capsule 100 mg  100 mg Oral TID PRN Nimish Luther Parody, MD      . feeding supplement (ENSURE ENLIVE) (ENSURE ENLIVE) liquid 237 mL  237 mL Oral BID BM Rosita Fire, MD   237 mL at 08/31/15 1644  . folic acid (FOLVITE) tablet 1 mg  1 mg Oral Daily Nimish C Gosrani, MD   1 mg at 08/31/15 1054  . furosemide (LASIX) tablet 40 mg  40 mg Oral Daily Nimish C Anastasio Champion, MD   40 mg at 08/31/15 1054  . lactulose (CHRONULAC) 10 GM/15ML solution 20 g  20 g Oral TID Doree Albee, MD   20 g at 08/31/15 1644  . lactulose (CHRONULAC) 10 GM/15ML solution 30 g  30 g Oral Once Sharlett Iles, MD   30 g at 08/30/15 1403  . metoprolol succinate (TOPROL-XL) 24 hr tablet 50 mg  50 mg Oral BID Nimish C Anastasio Champion, MD   50 mg at 08/31/15 1054  . nadolol (CORGARD) tablet 10 mg  10 mg Oral Daily Nimish C Gosrani, MD   10 mg at 08/31/15 1230  . ondansetron (ZOFRAN) tablet 4 mg  4 mg Oral Q6H PRN Nimish Luther Parody, MD       Or  . ondansetron (ZOFRAN) injection 4 mg  4 mg  Intravenous Q6H PRN Nimish C Gosrani, MD      . ondansetron (ZOFRAN) tablet 4 mg  4 mg Oral Q8H PRN Nimish C Gosrani, MD      . pantoprazole (PROTONIX) EC tablet 40 mg  40 mg Oral Daily Nimish C Gosrani, MD   40 mg at 08/31/15 1054  . rifaximin (XIFAXAN) tablet 550 mg  550 mg Oral BID Doree Albee, MD   550 mg at 08/31/15  1054  . spironolactone (ALDACTONE) tablet 100 mg  100 mg Oral Daily Nimish C Anastasio Champion, MD   100 mg at 08/31/15 1054  . thiamine (VITAMIN B-1) tablet 100 mg  100 mg Oral Daily Nimish C Gosrani, MD   100 mg at 08/31/15 1054  . traMADol (ULTRAM) tablet 50 mg  50 mg Oral Q8H PRN Doree Albee, MD        Allergies as of 08/30/2015 - Review Complete 08/30/2015  Allergen Reaction Noted  . Latex Swelling 07/15/2012  . Vicodin [hydrocodone-acetaminophen] Itching 09/06/2012    No family history on file.  Social History   Social History  . Marital Status: Married    Spouse Name: N/A  . Number of Children: N/A  . Years of Education: N/A   Occupational History  . Not on file.   Social History Main Topics  . Smoking status: Never Smoker   . Smokeless tobacco: Never Used  . Alcohol Use: 0.0 oz/week    0 Standard drinks or equivalent per week     Comment: none since 03/2015  . Drug Use: No  . Sexual Activity: Yes    Birth Control/ Protection: None   Other Topics Concern  . 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 However he is fairly capable of taking care of himself at home. This couple have an attentive daughter who keeps an eye on both of them.    Review of Systems: See HPI, otherwise normal ROS  Physical Exam: Temp:  [97.5 F (36.4 C)-98.9 F (37.2 C)] 98.9 F (37.2 C) (11/01 1619) Pulse Rate:  [53-80] 61 (11/01 1619) Resp:  [14-20] 14 (11/01 1619) BP: (99-137)/(68-77) 125/73 mmHg (11/01 1619) SpO2:  [97 %-100 %] 97 % (11/01 1515) Weight:  [142 lb 13.7 oz (64.8  kg)] 142 lb 13.7 oz (64.8 kg) (10/31 1817)   Patient is alert and oriented 2. She has asterixis. Conjunctiva is pale. Sclerae nonicteric. Oropharyngeal mucosa is unremarkable. No neck masses or thyromegaly noted. Cardiac exam with regular rhythm normal S1 and S2. Faint systolic ejection murmur noted at LLSB. Lungs are clear to auscultation. Abdomen is symmetrical soft and nontender without masses or organomegaly. Trace edema noted around ankles.   Lab Results:  Recent Labs  08/30/15 1344 08/31/15 0631  WBC 5.6 5.6  HGB 9.8* 8.8*  HCT 29.8* 26.7*  PLT 139* 150   BMET  Recent Labs  08/30/15 1344 08/31/15 0631  NA 138 143  K 4.9 4.5  CL 116* 119*  CO2 18* 21*  GLUCOSE 99 83  BUN 22* 21*  CREATININE 1.26* 1.16*  CALCIUM 8.2* 8.2*   LFT  Recent Labs  08/31/15 0631  PROT 4.6*  ALBUMIN 1.3*  AST 71*  ALT 28  ALKPHOS 137*  BILITOT 2.1*   PT/INR  Recent Labs  08/31/15 0631  LABPROT 19.3*  INR 1.62*   Hepatitis Panel No results for input(s): HEPBSAG, HCVAB, HEPAIGM, HEPBIGM in the last 72 hours.  Studies/Results: Ct Head Wo Contrast  08/30/2015  CLINICAL DATA:  Altered mental status. Reported history of a surgery for brain aneurysm. EXAM: CT HEAD WITHOUT CONTRAST TECHNIQUE: Contiguous axial images were obtained from the base of the skull through the vertex without intravenous contrast. COMPARISON:  None. FINDINGS: Postsurgical changes are noted from prior right frontal craniotomy. Aneurysm clips are noted in the anterior right basilar cistern just to the right of midline.  No evidence of parenchymal hemorrhage or extra-axial fluid collection. No mass lesion, mass effect, or midline shift. No CT evidence of acute infarction. There is somewhat prominent symmetric periventricular white matter hypodensity. Cerebral volume is age appropriate. No ventriculomegaly. The visualized paranasal sinuses are essentially clear. The mastoid air cells are unopacified. No  evidence of calvarial fracture. IMPRESSION: 1. No acute intracranial hemorrhage. 2. Nonspecific prominent periventricular white matter hypodensity, possibly due to chronic small vessel ischemia, although other etiologies such as demyelination should be considered as these changes are prominent for age. Correlate with brain MRI as clinically warranted. 3. Postsurgical changes from prior right frontal craniotomy with aneurysm clips in the anterior right basilar cistern. Electronically Signed   By: Ilona Sorrel M.D.   On: 08/30/2015 15:10   Assessment:  #1. Hepatic encephalopathy. It appears hepatic encephalopathy was triggered by patient's inability to by medications. She has been off lactulose and other medications for 1 week. There is no evidence of infection GI bleed or other precipitating events. TIPS placement about 6 weeks ago may have worsened marginal hepatic function. #2. Decompensated alcoholic cirrhosis. Patient has abstained from drinking alcohol since hospitalization in June 2016. Doubt that hepatic function will improve any further. Serum albumin is critically low and she would have temporary benefit with IV albumin. MELD score is 16 . Will arrange for referral to transplant evaluation. #3. Anemia appears to be chronic. No evidence of overt GI bleed. She underwent EGD on 04/22/2015 revealing three columns of grade 1 esophageal varices and portal gastropathy. #4. Thrombocytopenia secondary to cirrhosis.   Recommendations:  Continue lactulose and Xifaxan at current dose. Will give 25 g of IV albumin today and tomorrow. Patient needs to start participating in rehabilitation program on an outpatient basis since she is interested in transplant evaluation.    Duilio Heritage U  08/31/2015, 5:09 PM

## 2015-08-31 NOTE — Progress Notes (Signed)
Subjective: She was admitted with altered mental status thought to be related to hepatic encephalopathy. GI consult has been requested. She did not have an ammonia level that I see so that has been ordered.  Objective: Vital signs in last 24 hours: Temp:  [97.5 F (36.4 C)-98.4 F (36.9 C)] 97.9 F (36.6 C) (11/01 8338) Pulse Rate:  [53-80] 80 (11/01 0619) Resp:  [14-21] 20 (11/01 0004) BP: (99-144)/(68-106) 137/77 mmHg (11/01 0619) SpO2:  [100 %] 100 % (11/01 0619) Weight:  [64.8 kg (142 lb 13.7 oz)] 64.8 kg (142 lb 13.7 oz) (10/31 1817) Weight change:     Intake/Output from previous day:    PHYSICAL EXAM General appearance: She is arousable and will answer with one word answers Resp: clear to auscultation bilaterally Cardio: regular rate and rhythm, S1, S2 normal, no murmur, click, rub or gallop GI: soft, non-tender; bowel sounds normal; no masses,  no organomegaly Extremities: extremities normal, atraumatic, no cyanosis or edema  Lab Results:  Results for orders placed or performed during the hospital encounter of 08/30/15 (from the past 48 hour(s))  Comprehensive metabolic panel     Status: Abnormal   Collection Time: 08/30/15  1:44 PM  Result Value Ref Range   Sodium 138 135 - 145 mmol/L   Potassium 4.9 3.5 - 5.1 mmol/L   Chloride 116 (H) 101 - 111 mmol/L   CO2 18 (L) 22 - 32 mmol/L   Glucose, Bld 99 65 - 99 mg/dL   BUN 22 (H) 6 - 20 mg/dL   Creatinine, Ser 1.26 (H) 0.44 - 1.00 mg/dL   Calcium 8.2 (L) 8.9 - 10.3 mg/dL   Total Protein 5.4 (L) 6.5 - 8.1 g/dL   Albumin 1.6 (L) 3.5 - 5.0 g/dL   AST 83 (H) 15 - 41 U/L   ALT 34 14 - 54 U/L   Alkaline Phosphatase 205 (H) 38 - 126 U/L   Total Bilirubin 1.9 (H) 0.3 - 1.2 mg/dL   GFR calc non Af Amer 46 (L) >60 mL/min   GFR calc Af Amer 53 (L) >60 mL/min    Comment: (NOTE) The eGFR has been calculated using the CKD EPI equation. This calculation has not been validated in all clinical situations. eGFR's persistently <60  mL/min signify possible Chronic Kidney Disease.    Anion gap 4 (L) 5 - 15  Ethanol     Status: None   Collection Time: 08/30/15  1:44 PM  Result Value Ref Range   Alcohol, Ethyl (B) <5 <5 mg/dL    Comment:        LOWEST DETECTABLE LIMIT FOR SERUM ALCOHOL IS 5 mg/dL FOR MEDICAL PURPOSES ONLY   Lipase, blood     Status: Abnormal   Collection Time: 08/30/15  1:44 PM  Result Value Ref Range   Lipase 74 (H) 11 - 51 U/L    Comment: Please note change in reference range.  CBC with Differential     Status: Abnormal   Collection Time: 08/30/15  1:44 PM  Result Value Ref Range   WBC 5.6 4.0 - 10.5 K/uL   RBC 3.93 3.87 - 5.11 MIL/uL   Hemoglobin 9.8 (L) 12.0 - 15.0 g/dL   HCT 29.8 (L) 36.0 - 46.0 %   MCV 75.8 (L) 78.0 - 100.0 fL   MCH 24.9 (L) 26.0 - 34.0 pg   MCHC 32.9 30.0 - 36.0 g/dL   RDW 17.1 (H) 11.5 - 15.5 %   Platelets 139 (L) 150 - 400 K/uL  Neutrophils Relative % 48 %   Neutro Abs 2.7 1.7 - 7.7 K/uL   Lymphocytes Relative 31 %   Lymphs Abs 1.8 0.7 - 4.0 K/uL   Monocytes Relative 15 %   Monocytes Absolute 0.9 0.1 - 1.0 K/uL   Eosinophils Relative 3 %   Eosinophils Absolute 0.2 0.0 - 0.7 K/uL   Basophils Relative 3 %   Basophils Absolute 0.2 (H) 0.0 - 0.1 K/uL  Salicylate level     Status: None   Collection Time: 08/30/15  1:44 PM  Result Value Ref Range   Salicylate Lvl <8.2 2.8 - 30.0 mg/dL  Acetaminophen level     Status: Abnormal   Collection Time: 08/30/15  1:44 PM  Result Value Ref Range   Acetaminophen (Tylenol), Serum <10 (L) 10 - 30 ug/mL    Comment:        THERAPEUTIC CONCENTRATIONS VARY SIGNIFICANTLY. A RANGE OF 10-30 ug/mL MAY BE AN EFFECTIVE CONCENTRATION FOR MANY PATIENTS. HOWEVER, SOME ARE BEST TREATED AT CONCENTRATIONS OUTSIDE THIS RANGE. ACETAMINOPHEN CONCENTRATIONS >150 ug/mL AT 4 HOURS AFTER INGESTION AND >50 ug/mL AT 12 HOURS AFTER INGESTION ARE OFTEN ASSOCIATED WITH TOXIC REACTIONS.   I-stat troponin, ED     Status: None   Collection  Time: 08/30/15  3:15 PM  Result Value Ref Range   Troponin i, poc 0.02 0.00 - 0.08 ng/mL   Comment 3            Comment: Due to the release kinetics of cTnI, a negative result within the first hours of the onset of symptoms does not rule out myocardial infarction with certainty. If myocardial infarction is still suspected, repeat the test at appropriate intervals.   I-Stat CG4 Lactic Acid, ED     Status: None   Collection Time: 08/30/15  3:18 PM  Result Value Ref Range   Lactic Acid, Venous 1.65 0.5 - 2.0 mmol/L  Urine rapid drug screen (hosp performed)     Status: None   Collection Time: 08/30/15  4:42 PM  Result Value Ref Range   Opiates NONE DETECTED NONE DETECTED   Cocaine NONE DETECTED NONE DETECTED   Benzodiazepines NONE DETECTED NONE DETECTED   Amphetamines NONE DETECTED NONE DETECTED   Tetrahydrocannabinol NONE DETECTED NONE DETECTED   Barbiturates NONE DETECTED NONE DETECTED    Comment:        DRUG SCREEN FOR MEDICAL PURPOSES ONLY.  IF CONFIRMATION IS NEEDED FOR ANY PURPOSE, NOTIFY LAB WITHIN 5 DAYS.        LOWEST DETECTABLE LIMITS FOR URINE DRUG SCREEN Drug Class       Cutoff (ng/mL) Amphetamine      1000 Barbiturate      200 Benzodiazepine   500 Tricyclics       370 Opiates          300 Cocaine          300 THC              50   Urinalysis, Routine w reflex microscopic     Status: Abnormal   Collection Time: 08/30/15  4:42 PM  Result Value Ref Range   Color, Urine YELLOW YELLOW   APPearance CLEAR CLEAR   Specific Gravity, Urine 1.010 1.005 - 1.030   pH 6.5 5.0 - 8.0   Glucose, UA NEGATIVE NEGATIVE mg/dL   Hgb urine dipstick MODERATE (A) NEGATIVE   Bilirubin Urine NEGATIVE NEGATIVE   Ketones, ur NEGATIVE NEGATIVE mg/dL   Protein, ur 100 (A) NEGATIVE mg/dL  Urobilinogen, UA 0.2 0.0 - 1.0 mg/dL   Nitrite NEGATIVE NEGATIVE   Leukocytes, UA SMALL (A) NEGATIVE  Urine microscopic-add on     Status: Abnormal   Collection Time: 08/30/15  4:42 PM  Result  Value Ref Range   Squamous Epithelial / LPF FEW (A) RARE   WBC, UA TOO NUMEROUS TO COUNT <3 WBC/hpf   RBC / HPF 3-6 <3 RBC/hpf   Bacteria, UA MANY (A) RARE  Comprehensive metabolic panel     Status: Abnormal   Collection Time: 08/31/15  6:31 AM  Result Value Ref Range   Sodium 143 135 - 145 mmol/L   Potassium 4.5 3.5 - 5.1 mmol/L   Chloride 119 (H) 101 - 111 mmol/L   CO2 21 (L) 22 - 32 mmol/L   Glucose, Bld 83 65 - 99 mg/dL   BUN 21 (H) 6 - 20 mg/dL   Creatinine, Ser 1.16 (H) 0.44 - 1.00 mg/dL   Calcium 8.2 (L) 8.9 - 10.3 mg/dL   Total Protein 4.6 (L) 6.5 - 8.1 g/dL   Albumin 1.3 (L) 3.5 - 5.0 g/dL   AST 71 (H) 15 - 41 U/L   ALT 28 14 - 54 U/L   Alkaline Phosphatase 137 (H) 38 - 126 U/L   Total Bilirubin 2.1 (H) 0.3 - 1.2 mg/dL   GFR calc non Af Amer 50 (L) >60 mL/min   GFR calc Af Amer 59 (L) >60 mL/min    Comment: (NOTE) The eGFR has been calculated using the CKD EPI equation. This calculation has not been validated in all clinical situations. eGFR's persistently <60 mL/min signify possible Chronic Kidney Disease.    Anion gap 3 (L) 5 - 15  CBC     Status: Abnormal   Collection Time: 08/31/15  6:31 AM  Result Value Ref Range   WBC 5.6 4.0 - 10.5 K/uL   RBC 3.55 (L) 3.87 - 5.11 MIL/uL   Hemoglobin 8.8 (L) 12.0 - 15.0 g/dL   HCT 26.7 (L) 36.0 - 46.0 %   MCV 75.2 (L) 78.0 - 100.0 fL   MCH 24.8 (L) 26.0 - 34.0 pg   MCHC 33.0 30.0 - 36.0 g/dL   RDW 17.1 (H) 11.5 - 15.5 %   Platelets 150 150 - 400 K/uL  Protime-INR     Status: Abnormal   Collection Time: 08/31/15  6:31 AM  Result Value Ref Range   Prothrombin Time 19.3 (H) 11.6 - 15.2 seconds   INR 1.62 (H) 0.00 - 1.49    ABGS No results for input(s): PHART, PO2ART, TCO2, HCO3 in the last 72 hours.  Invalid input(s): PCO2 CULTURES No results found for this or any previous visit (from the past 240 hour(s)). Studies/Results: Ct Head Wo Contrast  08/30/2015  CLINICAL DATA:  Altered mental status. Reported history  of a surgery for brain aneurysm. EXAM: CT HEAD WITHOUT CONTRAST TECHNIQUE: Contiguous axial images were obtained from the base of the skull through the vertex without intravenous contrast. COMPARISON:  None. FINDINGS: Postsurgical changes are noted from prior right frontal craniotomy. Aneurysm clips are noted in the anterior right basilar cistern just to the right of midline. No evidence of parenchymal hemorrhage or extra-axial fluid collection. No mass lesion, mass effect, or midline shift. No CT evidence of acute infarction. There is somewhat prominent symmetric periventricular white matter hypodensity. Cerebral volume is age appropriate. No ventriculomegaly. The visualized paranasal sinuses are essentially clear. The mastoid air cells are unopacified. No evidence of calvarial fracture. IMPRESSION: 1.  No acute intracranial hemorrhage. 2. Nonspecific prominent periventricular white matter hypodensity, possibly due to chronic small vessel ischemia, although other etiologies such as demyelination should be considered as these changes are prominent for age. Correlate with brain MRI as clinically warranted. 3. Postsurgical changes from prior right frontal craniotomy with aneurysm clips in the anterior right basilar cistern. Electronically Signed   By: Ilona Sorrel M.D.   On: 08/30/2015 15:10    Medications:  Prior to Admission:  Prescriptions prior to admission  Medication Sig Dispense Refill Last Dose  . benzonatate (TESSALON) 100 MG capsule Take 1 capsule (100 mg total) by mouth 3 (three) times daily as needed for cough. 20 capsule 0 unknown  . folic acid (FOLVITE) 1 MG tablet Take 1 tablet (1 mg total) by mouth daily. 30 tablet 3 unknown  . furosemide (LASIX) 40 MG tablet Take 1 tablet (40 mg total) by mouth daily. 30 tablet 3 unknown  . lactulose (CHRONULAC) 10 GM/15ML solution Take 30 mLs (20 g total) by mouth 2 (two) times daily. Titrate for 2-3 BM's day (Patient taking differently: Take 20 g by mouth 3  (three) times daily. Four times a day) 240 mL 1 unknown  . nadolol (CORGARD) 20 MG tablet Take 0.5 tablets (10 mg total) by mouth daily. 30 tablet 3 unknown  . ondansetron (ZOFRAN) 4 MG tablet Take 1 tablet (4 mg total) by mouth every 8 (eight) hours as needed for nausea or vomiting. 30 tablet 1 unknown  . spironolactone (ALDACTONE) 100 MG tablet Take 1 tablet (100 mg total) by mouth daily. 30 tablet 3 unknown  . thiamine 100 MG tablet Take 1 tablet (100 mg total) by mouth daily. 30 tablet 3 unknown  . traMADol (ULTRAM) 50 MG tablet Take 1 tablet (50 mg total) by mouth every 8 (eight) hours as needed for moderate pain. 30 tablet 0 unknown  . metoprolol succinate (TOPROL-XL) 50 MG 24 hr tablet Take 50 mg by mouth 2 (two) times daily. Take with or immediately following a meal.   Taking  . oxyCODONE (OXY IR/ROXICODONE) 5 MG immediate release tablet Take 1 tablet (5 mg total) by mouth 3 (three) times daily as needed for severe pain. (Patient taking differently: Take 5 mg by mouth 3 (three) times daily as needed for moderate pain or severe pain. ) 40 tablet 0 Not Taking  . pantoprazole (PROTONIX) 40 MG tablet Take 1 tablet (40 mg total) by mouth daily. 30 tablet 3 Taking  . rifaximin (XIFAXAN) 550 MG TABS tablet Take 550 mg by mouth 2 (two) times daily.   Not Taking   Scheduled: . feeding supplement (ENSURE ENLIVE)  237 mL Oral BID BM  . folic acid  1 mg Oral Daily  . furosemide  40 mg Oral Daily  . lactulose  20 g Oral TID  . lactulose  30 g Oral Once  . metoprolol succinate  50 mg Oral BID  . nadolol  10 mg Oral Daily  . pantoprazole  40 mg Oral Daily  . rifaximin  550 mg Oral BID  . spironolactone  100 mg Oral Daily  . thiamine  100 mg Oral Daily   Continuous: . sodium chloride 75 mL/hr (08/31/15 0702)   ZGY:[id number], ondansetron **OR** ondansetron (ZOFRAN) IV, ondansetron, traMADol  Assesment: She was admitted with hepatic encephalopathy. Ammonia level will be checked. At baseline she  has cirrhosis. Active Problems:   Cirrhosis, alcoholic ([place])   Altered mental status   Hepatic encephalopathy ([place])    Plan:  GI consultation. Continue with lactulose enemas.    LOS: 1 day   Masie Bermingham L 08/31/2015, 9:16 AM

## 2015-08-31 NOTE — Consult Note (Signed)
   [facility] CM Inpatient Consult   08/31/2015  Savannah Jackson [birth year] [id number]  Met with the patient at bedside to offer [place] [facility] services as benefit of [facility] [employer] insurance. Patient agreeable to services and will receive post hospital discharge call and will be evaluated for monthly home visits.  Consent obtained. Inpatient case manager made aware. Of note, [facility] Care Management services does not replace or interfere with any services that are arranged by inpatient case management or social work.  For additional questions or referrals please contact: Royetta Crochet. Laymond Purser, RN, BSN, [facility] Liaison [phone]

## 2015-09-01 DIAGNOSIS — N183 Chronic kidney disease, stage 3 (moderate): Secondary | ICD-10-CM

## 2015-09-01 DIAGNOSIS — R1084 Generalized abdominal pain: Secondary | ICD-10-CM

## 2015-09-01 LAB — AMMONIA: Ammonia: 66 umol/L — ABNORMAL HIGH (ref 9–35)

## 2015-09-01 MED ORDER — LACTULOSE 10 GM/15ML PO SOLN: 20.0000 g | Freq: Three times a day (TID) | ORAL | Status: DC

## 2015-09-01 MED ORDER — OXYCODONE HCL 5 MG PO TABS: 5.0000 mg | ORAL_TABLET | Freq: Three times a day (TID) | ORAL | Status: DC | PRN

## 2015-09-01 MED ORDER — ALBUMIN HUMAN 25 % IV SOLN
50.0000 g | Freq: Once | INTRAVENOUS | Status: AC
  Administered 2015-09-01: 50 g via INTRAVENOUS
  Filled 2015-09-01: qty 200

## 2015-09-01 NOTE — Care Management Note (Signed)
Case Management Note  Patient Details  Name: Savannah Jackson MRN: [id number] Date of Birth: [birth year]  Subjective/Objective:                    Action/Plan:   Expected Discharge Date:  09/02/15               Expected Discharge Plan:  Home/Self Care  In-House Referral:  NA  Discharge planning Services  CM Consult  Post Acute Care Choice:  NA Choice offered to:  NA  DME Arranged:    DME Agency:     HH Arranged:    [employer] Agency:     Status of Service:  Completed, signed off  Medicare Important Message Given:  N/A - LOS <3 / Initial given by admissions Date Medicare IM Given:    Medicare IM give by:    Date Additional Medicare IM Given:    Additional Medicare Important Message give by:     If discussed at [place] of Stay Meetings, dates discussed:    Additional Comments: Pt for discharge home today. No CM needs noted. Awaiting for GI office to call about possible samples of the xifaxan. If so pt to go by office to obtain samples. Pt and pts daughter aware of discharge instructions. Christinia Gully Fries, RN 09/01/2015, 12:41 PM

## 2015-09-01 NOTE — Progress Notes (Addendum)
Patient ID: Savannah Jackson, female   DOB: [birth year], 59 y.o.   MRN: [id number] States she feels much better. She knows   She is at [facility].She tells me she had not taken her medicine even as far back as her OV with me. She could not afford the Xifaxan. Patient is alert. She had she thinks about 10 BM during the night. She says she was up and down all night.  Filed Vitals:   09/01/15 0607  BP: 132/73  Pulse: 61  Temp: 97.5 F (36.4 C)  Resp: 16  I/O last 3 completed shifts: In: 1981.3 [P.O.:720; I.V.:1161.3; IV Piggyback:100] Out: 7 [Urine:3; Stool:4]    CMP Latest Ref Rng 08/31/2015 08/30/2015 08/10/2015  Glucose 65 - 99 mg/dL 83 99 -  BUN 6 - 20 mg/dL 21(H) 22(H) -  Creatinine 0.44 - 1.00 mg/dL 1.16(H) 1.26(H) -  Sodium 135 - 145 mmol/L 143 138 -  Potassium 3.5 - 5.1 mmol/L 4.5 4.9 -  Chloride 101 - 111 mmol/L 119(H) 116(H) -  CO2 22 - 32 mmol/L 21(L) 18(L) -  Calcium 8.9 - 10.3 mg/dL 8.2(L) 8.2(L) -  Total Protein 6.5 - 8.1 g/dL 4.6(L) 5.4(L) 4.5(L)  Total Bilirubin 0.3 - 1.2 mg/dL 2.1(H) 1.9(H) 1.4(H)  Alkaline Phos 38 - 126 U/L 137(H) 205(H) 183(H)  AST 15 - 41 U/L 71(H) 83(H) 64(H)  ALT 14 - 54 U/L 28 34 28   Ammonia 66 this am. Diagnosis: Hepatic encephalopathy: She feels better this morning.More alert today.  Will continue to monitor.   GI attending note; Patient is alert and oriented to place person and time today She does not have asterixis. As discussed with Dr. Luan Pulling who was covering for Dr. Legrand Rams patient can go home after she receives 50 g of albumin IV. Will try to obtain Xifaxan for patient fermenter fracture. Plan to see patient in the office within 2 weeks.

## 2015-09-01 NOTE — Progress Notes (Signed)
Subjective: She is much more alert. She says she feels better and wants to go home.  Objective: Vital signs in last 24 hours: Temp:  [97.5 F (36.4 C)-100.5 F (38.1 C)] 97.5 F (36.4 C) (11/02 0607) Pulse Rate:  [61-74] 61 (11/02 0607) Resp:  [14-16] 16 (11/02 0607) BP: (125-132)/(73-77) 132/73 mmHg (11/02 0607) SpO2:  [97 %-100 %] 100 % (11/02 0607) Weight change:     Intake/Output from previous day: 11/01 0701 - 11/02 0700 In: 1981.3 [P.O.:720; I.V.:1161.3; IV Piggyback:100] Out: 7 [Urine:3; Stool:4]  PHYSICAL EXAM General appearance: alert, cooperative and no distress Resp: clear to auscultation bilaterally Cardio: regular rate and rhythm, S1, S2 normal, no murmur, click, rub or gallop GI: soft, non-tender; bowel sounds normal; no masses,  no organomegaly Extremities: extremities normal, atraumatic, no cyanosis or edema  Lab Results:  Results for orders placed or performed during the hospital encounter of 08/30/15 (from the past 48 hour(s))  Comprehensive metabolic panel     Status: Abnormal   Collection Time: 08/30/15  1:44 PM  Result Value Ref Range   Sodium 138 135 - 145 mmol/L   Potassium 4.9 3.5 - 5.1 mmol/L   Chloride 116 (H) 101 - 111 mmol/L   CO2 18 (L) 22 - 32 mmol/L   Glucose, Bld 99 65 - 99 mg/dL   BUN 22 (H) 6 - 20 mg/dL   Creatinine, Ser 1.26 (H) 0.44 - 1.00 mg/dL   Calcium 8.2 (L) 8.9 - 10.3 mg/dL   Total Protein 5.4 (L) 6.5 - 8.1 g/dL   Albumin 1.6 (L) 3.5 - 5.0 g/dL   AST 83 (H) 15 - 41 U/L   ALT 34 14 - 54 U/L   Alkaline Phosphatase 205 (H) 38 - 126 U/L   Total Bilirubin 1.9 (H) 0.3 - 1.2 mg/dL   GFR calc non Af Amer 46 (L) >60 mL/min   GFR calc Af Amer 53 (L) >60 mL/min    Comment: (NOTE) The eGFR has been calculated using the CKD EPI equation. This calculation has not been validated in all clinical situations. eGFR's persistently <60 mL/min signify possible Chronic Kidney Disease.    Anion gap 4 (L) 5 - 15  Ethanol     Status: None   Collection Time: 08/30/15  1:44 PM  Result Value Ref Range   Alcohol, Ethyl (B) <5 <5 mg/dL    Comment:        LOWEST DETECTABLE LIMIT FOR SERUM ALCOHOL IS 5 mg/dL FOR MEDICAL PURPOSES ONLY   Lipase, blood     Status: Abnormal   Collection Time: 08/30/15  1:44 PM  Result Value Ref Range   Lipase 74 (H) 11 - 51 U/L    Comment: Please note change in reference range.  CBC with Differential     Status: Abnormal   Collection Time: 08/30/15  1:44 PM  Result Value Ref Range   WBC 5.6 4.0 - 10.5 K/uL   RBC 3.93 3.87 - 5.11 MIL/uL   Hemoglobin 9.8 (L) 12.0 - 15.0 g/dL   HCT 29.8 (L) 36.0 - 46.0 %   MCV 75.8 (L) 78.0 - 100.0 fL   MCH 24.9 (L) 26.0 - 34.0 pg   MCHC 32.9 30.0 - 36.0 g/dL   RDW 17.1 (H) 11.5 - 15.5 %   Platelets 139 (L) 150 - 400 K/uL   Neutrophils Relative % 48 %   Neutro Abs 2.7 1.7 - 7.7 K/uL   Lymphocytes Relative 31 %   Lymphs Abs 1.8 0.7 -  4.0 K/uL   Monocytes Relative 15 %   Monocytes Absolute 0.9 0.1 - 1.0 K/uL   Eosinophils Relative 3 %   Eosinophils Absolute 0.2 0.0 - 0.7 K/uL   Basophils Relative 3 %   Basophils Absolute 0.2 (H) 0.0 - 0.1 K/uL  Salicylate level     Status: None   Collection Time: 08/30/15  1:44 PM  Result Value Ref Range   Salicylate Lvl <4.0 2.8 - 30.0 mg/dL  Acetaminophen level     Status: Abnormal   Collection Time: 08/30/15  1:44 PM  Result Value Ref Range   Acetaminophen (Tylenol), Serum <10 (L) 10 - 30 ug/mL    Comment:        THERAPEUTIC CONCENTRATIONS VARY SIGNIFICANTLY. A RANGE OF 10-30 ug/mL MAY BE AN EFFECTIVE CONCENTRATION FOR MANY PATIENTS. HOWEVER, SOME ARE BEST TREATED AT CONCENTRATIONS OUTSIDE THIS RANGE. ACETAMINOPHEN CONCENTRATIONS >150 ug/mL AT 4 HOURS AFTER INGESTION AND >50 ug/mL AT 12 HOURS AFTER INGESTION ARE OFTEN ASSOCIATED WITH TOXIC REACTIONS.   I-stat troponin, ED     Status: None   Collection Time: 08/30/15  3:15 PM  Result Value Ref Range   Troponin i, poc 0.02 0.00 - 0.08 ng/mL   Comment 3             Comment: Due to the release kinetics of cTnI, a negative result within the first hours of the onset of symptoms does not rule out myocardial infarction with certainty. If myocardial infarction is still suspected, repeat the test at appropriate intervals.   I-Stat CG4 Lactic Acid, ED     Status: None   Collection Time: 08/30/15  3:18 PM  Result Value Ref Range   Lactic Acid, Venous 1.65 0.5 - 2.0 mmol/L  Urine rapid drug screen (hosp performed)     Status: None   Collection Time: 08/30/15  4:42 PM  Result Value Ref Range   Opiates NONE DETECTED NONE DETECTED   Cocaine NONE DETECTED NONE DETECTED   Benzodiazepines NONE DETECTED NONE DETECTED   Amphetamines NONE DETECTED NONE DETECTED   Tetrahydrocannabinol NONE DETECTED NONE DETECTED   Barbiturates NONE DETECTED NONE DETECTED    Comment:        DRUG SCREEN FOR MEDICAL PURPOSES ONLY.  IF CONFIRMATION IS NEEDED FOR ANY PURPOSE, NOTIFY LAB WITHIN 5 DAYS.        LOWEST DETECTABLE LIMITS FOR URINE DRUG SCREEN Drug Class       Cutoff (ng/mL) Amphetamine      1000 Barbiturate      200 Benzodiazepine   200 Tricyclics       300 Opiates          300 Cocaine          300 THC              50   Urinalysis, Routine w reflex microscopic     Status: Abnormal   Collection Time: 08/30/15  4:42 PM  Result Value Ref Range   Color, Urine YELLOW YELLOW   APPearance CLEAR CLEAR   Specific Gravity, Urine 1.010 1.005 - 1.030   pH 6.5 5.0 - 8.0   Glucose, UA NEGATIVE NEGATIVE mg/dL   Hgb urine dipstick MODERATE (A) NEGATIVE   Bilirubin Urine NEGATIVE NEGATIVE   Ketones, ur NEGATIVE NEGATIVE mg/dL   Protein, ur 219 (A) NEGATIVE mg/dL   Urobilinogen, UA 0.2 0.0 - 1.0 mg/dL   Nitrite NEGATIVE NEGATIVE   Leukocytes, UA SMALL (A) NEGATIVE  Urine microscopic-add on  Status: Abnormal   Collection Time: 08/30/15  4:42 PM  Result Value Ref Range   Squamous Epithelial / LPF FEW (A) RARE   WBC, UA TOO NUMEROUS TO COUNT <3 WBC/hpf   RBC /  HPF 3-6 <3 RBC/hpf   Bacteria, UA MANY (A) RARE  Comprehensive metabolic panel     Status: Abnormal   Collection Time: 08/31/15  6:31 AM  Result Value Ref Range   Sodium 143 135 - 145 mmol/L   Potassium 4.5 3.5 - 5.1 mmol/L   Chloride 119 (H) 101 - 111 mmol/L   CO2 21 (L) 22 - 32 mmol/L   Glucose, Bld 83 65 - 99 mg/dL   BUN 21 (H) 6 - 20 mg/dL   Creatinine, Ser 1.16 (H) 0.44 - 1.00 mg/dL   Calcium 8.2 (L) 8.9 - 10.3 mg/dL   Total Protein 4.6 (L) 6.5 - 8.1 g/dL   Albumin 1.3 (L) 3.5 - 5.0 g/dL   AST 71 (H) 15 - 41 U/L   ALT 28 14 - 54 U/L   Alkaline Phosphatase 137 (H) 38 - 126 U/L   Total Bilirubin 2.1 (H) 0.3 - 1.2 mg/dL   GFR calc non Af Amer 50 (L) >60 mL/min   GFR calc Af Amer 59 (L) >60 mL/min    Comment: (NOTE) The eGFR has been calculated using the CKD EPI equation. This calculation has not been validated in all clinical situations. eGFR's persistently <60 mL/min signify possible Chronic Kidney Disease.    Anion gap 3 (L) 5 - 15  CBC     Status: Abnormal   Collection Time: 08/31/15  6:31 AM  Result Value Ref Range   WBC 5.6 4.0 - 10.5 K/uL   RBC 3.55 (L) 3.87 - 5.11 MIL/uL   Hemoglobin 8.8 (L) 12.0 - 15.0 g/dL   HCT 26.7 (L) 36.0 - 46.0 %   MCV 75.2 (L) 78.0 - 100.0 fL   MCH 24.8 (L) 26.0 - 34.0 pg   MCHC 33.0 30.0 - 36.0 g/dL   RDW 17.1 (H) 11.5 - 15.5 %   Platelets 150 150 - 400 K/uL  Protime-INR     Status: Abnormal   Collection Time: 08/31/15  6:31 AM  Result Value Ref Range   Prothrombin Time 19.3 (H) 11.6 - 15.2 seconds   INR 1.62 (H) 0.00 - 1.49  Ammonia     Status: Abnormal   Collection Time: 08/31/15 10:06 AM  Result Value Ref Range   Ammonia 67 (H) 9 - 35 umol/L  Ammonia     Status: Abnormal   Collection Time: 09/01/15  6:35 AM  Result Value Ref Range   Ammonia 66 (H) 9 - 35 umol/L    ABGS No results for input(s): PHART, PO2ART, TCO2, HCO3 in the last 72 hours.  Invalid input(s): PCO2 CULTURES No results found for this or any previous visit  (from the past 240 hour(s)). Studies/Results: Ct Head Wo Contrast  08/30/2015  CLINICAL DATA:  Altered mental status. Reported history of a surgery for brain aneurysm. EXAM: CT HEAD WITHOUT CONTRAST TECHNIQUE: Contiguous axial images were obtained from the base of the skull through the vertex without intravenous contrast. COMPARISON:  None. FINDINGS: Postsurgical changes are noted from prior right frontal craniotomy. Aneurysm clips are noted in the anterior right basilar cistern just to the right of midline. No evidence of parenchymal hemorrhage or extra-axial fluid collection. No mass lesion, mass effect, or midline shift. No CT evidence of acute infarction. There is somewhat prominent  symmetric periventricular white matter hypodensity. Cerebral volume is age appropriate. No ventriculomegaly. The visualized paranasal sinuses are essentially clear. The mastoid air cells are unopacified. No evidence of calvarial fracture. IMPRESSION: 1. No acute intracranial hemorrhage. 2. Nonspecific prominent periventricular white matter hypodensity, possibly due to chronic small vessel ischemia, although other etiologies such as demyelination should be considered as these changes are prominent for age. Correlate with brain MRI as clinically warranted. 3. Postsurgical changes from prior right frontal craniotomy with aneurysm clips in the anterior right basilar cistern. Electronically Signed   By: Ilona Sorrel M.D.   On: 08/30/2015 15:10    Medications:  Prior to Admission:  Prescriptions prior to admission  Medication Sig Dispense Refill Last Dose  . benzonatate (TESSALON) 100 MG capsule Take 1 capsule (100 mg total) by mouth 3 (three) times daily as needed for cough. 20 capsule 0 unknown  . folic acid (FOLVITE) 1 MG tablet Take 1 tablet (1 mg total) by mouth daily. 30 tablet 3 unknown  . furosemide (LASIX) 40 MG tablet Take 1 tablet (40 mg total) by mouth daily. 30 tablet 3 unknown  . nadolol (CORGARD) 20 MG tablet  Take 0.5 tablets (10 mg total) by mouth daily. 30 tablet 3 unknown  . ondansetron (ZOFRAN) 4 MG tablet Take 1 tablet (4 mg total) by mouth every 8 (eight) hours as needed for nausea or vomiting. 30 tablet 1 unknown  . spironolactone (ALDACTONE) 100 MG tablet Take 1 tablet (100 mg total) by mouth daily. 30 tablet 3 unknown  . thiamine 100 MG tablet Take 1 tablet (100 mg total) by mouth daily. 30 tablet 3 unknown  . traMADol (ULTRAM) 50 MG tablet Take 1 tablet (50 mg total) by mouth every 8 (eight) hours as needed for moderate pain. 30 tablet 0 unknown  . [DISCONTINUED] lactulose (CHRONULAC) 10 GM/15ML solution Take 30 mLs (20 g total) by mouth 2 (two) times daily. Titrate for 2-3 BM's day (Patient taking differently: Take 20 g by mouth 3 (three) times daily. Four times a day) 240 mL 1 unknown  . metoprolol succinate (TOPROL-XL) 50 MG 24 hr tablet Take 50 mg by mouth 2 (two) times daily. Take with or immediately following a meal.   Taking  . pantoprazole (PROTONIX) 40 MG tablet Take 1 tablet (40 mg total) by mouth daily. 30 tablet 3 Taking  . rifaximin (XIFAXAN) 550 MG TABS tablet Take 550 mg by mouth 2 (two) times daily.   Not Taking  . [DISCONTINUED] oxyCODONE (OXY IR/ROXICODONE) 5 MG immediate release tablet Take 1 tablet (5 mg total) by mouth 3 (three) times daily as needed for severe pain. (Patient taking differently: Take 5 mg by mouth 3 (three) times daily as needed for moderate pain or severe pain. ) 40 tablet 0 Not Taking   Scheduled: . feeding supplement (ENSURE ENLIVE)  237 mL Oral BID BM  . folic acid  1 mg Oral Daily  . furosemide  40 mg Oral Daily  . lactulose  20 g Oral TID  . lactulose  30 g Oral Once  . metoprolol succinate  50 mg Oral BID  . nadolol  10 mg Oral Daily  . pantoprazole  40 mg Oral Daily  . rifaximin  550 mg Oral BID  . spironolactone  100 mg Oral Daily  . thiamine  100 mg Oral Daily   Continuous: . sodium chloride 75 mL/hr at 08/31/15 2231   PRF:[id number],  ondansetron **OR** ondansetron (ZOFRAN) IV, ondansetron, traMADol  Assesment: She was  admitted with metabolic encephalopathy from her cirrhosis. Although her ammonia level has not changed very much she looks much better. I discussed her situation with GI and they feel that she could have some albumin today and then be discharged as long as family will be available. I have some samples of medication that she can take until we can get her on one of the drug company programs Active Problems:   Cirrhosis, alcoholic ([place])   Altered mental status   Hepatic encephalopathy ([place])    Plan: 50 g of albumin today and then potential discharge    LOS: 2 days   Diamonte Stavely L 09/01/2015, 9:20 AM

## 2015-09-01 NOTE — Care Management Important Message (Signed)
Important Message  Patient Details  Name: Savannah Jackson MRN: [id number] Date of Birth: 06/20/1956   Medicare Important Message Given:  N/A - LOS <3 / Initial given by admissions    Joylene Draft, RN 09/01/2015, 12:39 PM

## 2015-09-01 NOTE — Progress Notes (Signed)
Pt's IV catheter removed and intact. Pt's IV site clean dry and intact. Discharge instructions and medications reviewed and discussed with patient's daughter. All questions were answered and no further questions at this time. Pt in stable condition and in no acute distress at time of discharge. Pt escorted by nurse tech.

## 2015-09-02 ENCOUNTER — Other Ambulatory Visit: Payer: Self-pay

## 2015-09-02 NOTE — Patient Outreach (Signed)
I called [name] to set up an appointment to discuss her medication needs.  I had to leave a HIPPA compliant message for her to call me back.  I will try again at a later date.   Savannah Jackson, PharmD, [place] [phone]

## 2015-09-03 ENCOUNTER — Other Ambulatory Visit (INDEPENDENT_AMBULATORY_CARE_PROVIDER_SITE_OTHER): Payer: Self-pay | Admitting: *Deleted

## 2015-09-03 ENCOUNTER — Telehealth (INDEPENDENT_AMBULATORY_CARE_PROVIDER_SITE_OTHER): Payer: Self-pay | Admitting: *Deleted

## 2015-09-03 ENCOUNTER — Other Ambulatory Visit: Payer: Self-pay

## 2015-09-03 ENCOUNTER — Other Ambulatory Visit: Payer: Self-pay | Admitting: *Deleted

## 2015-09-03 MED ORDER — LACTULOSE 10 GM/15ML PO SOLN: 20.0000 g | Freq: Three times a day (TID) | ORAL | Status: DC

## 2015-09-03 NOTE — Patient Outreach (Signed)
I called Savannah Jackson and had to leave a HIPPA compliant message.  I will follow up early next week if I do not hear from her today.  Deanne Coffer, PharmD, [place] [phone]

## 2015-09-03 NOTE — Patient Outreach (Signed)
09/03/15- Telephone call for transition of care week 1, spoke with patient, HIPAA verified, pt sleeping this morning and sounds fatigued, pt agreeable to home visit next week with RN CM, pt has no issues with transportation, pt says she will make hospital follow up appointment with Dr. Legrand Rams to be seen within 7 days, pt states she is able to do this without RN CM assistance.  Pt states her main problem is with medications, cannot afford xifaxan and did not receive samples, pt agrees for [facility] pharmacist to contact her (pharmacist did try to reach pt yesterday), Pt states she has all medications except pantoprazole and xifaxan, and no longer takes tramadol, pt confused about some of her medications stating at first she does not have lactulose, then stating she does have,  RN CM called [facility] in [place] and spoke with [place] who reports pt has not picked up xifaxan and will cost $700.00, pt has not filled furosemide since September (although pt states she has this and is taking), and lactulose was picked up by family member yesterday.  RN CM sent In [employer], email and left voicemail for [employer] requesting she call pt today for assistance with medications.  RN CM faxed transtion of care not to primary MD Dr. Legrand Rams.  Medications Reviewed Today    Reviewed by Kassie Mends, RN (Registered Nurse) on 09/03/15 at [phone]  Med List Status: <None>   Medication Order Taking? Sig Documenting Provider Last Dose Status Informant   benzonatate (TESSALON) 100 MG capsule [id number] Yes Take 1 capsule (100 mg total) by mouth 3 (three) times daily as needed for cough. Ripudeep Krystal Eaton, MD Taking Active Pharmacy Records   folic acid (FOLVITE) 1 MG tablet [id number] Yes Take 1 tablet (1 mg total) by mouth daily. Rosita Fire, MD Taking Active Pharmacy Records   furosemide (LASIX) 40 MG tablet [id number] Yes Take 1 tablet (40 mg total) by mouth daily. Ripudeep Krystal Eaton, MD Taking Active Pharmacy Records   lactulose [facility])  10 GM/15ML solution [id number] Yes Take 30 mLs (20 g total) by mouth 3 (three) times daily. Four times a day Sinda Du, MD Taking Active    nadolol (CORGARD) 20 MG tablet [id number] Yes Take 0.5 tablets (10 mg total) by mouth daily. Ripudeep Krystal Eaton, MD Taking Active Pharmacy Records   ondansetron [facility]) 4 MG tablet [id number] Yes Take 1 tablet (4 mg total) by mouth every 8 (eight) hours as needed for nausea or vomiting. Butch Penny, NP Taking Active Pharmacy Records   oxyCODONE (OXY IR/ROXICODONE) 5 MG immediate release tablet [id number] Yes Take 1 tablet (5 mg total) by mouth 3 (three) times daily as needed for moderate pain or severe pain. Sinda Du, MD Taking Active    pantoprazole (PROTONIX) 40 MG tablet [id number] No Take 1 tablet (40 mg total) by mouth daily.  Patient not taking:  Reported on 09/03/2015   Rosita Fire, MD Not Taking Active Pharmacy Records             Med Note Arvilla Market Aug 30, 2015  2:25 PM): Last filled on 06/09/15.    rifaximin (XIFAXAN) 550 MG TABS tablet [id number] No Take 550 mg by mouth 2 (two) times daily. Jacqulynn Cadet, MD Not Taking Active    spironolactone (ALDACTONE) 100 MG tablet [id number] Yes Take 1 tablet (100 mg total) by mouth daily. Ripudeep Krystal Eaton, MD Taking Active Pharmacy Records   thiamine 100 MG tablet [id number] Yes Take 1 tablet (  100 mg total) by mouth daily. Rosita Fire, MD Taking Active Pharmacy Records   traMADol (ULTRAM) 50 MG tablet [id number] No Take 1 tablet (50 mg total) by mouth every 8 (eight) hours as needed for moderate pain.  Patient not taking:  Reported on 09/03/2015   Ripudeep Krystal Eaton, MD Not Taking Active Pharmacy Records          [facility] CM Care Plan Problem One        Most Recent Value   Care Plan Problem One  knowledge deficit related to disease process Cirrhosis   Role Documenting the Problem One  Care Management Coordinator   Care Plan for Problem One  Active   [facility] Long Term Goal (31-90 days)  pt will  have no hospital readmissions within 90 days   [facility] Long Term Goal Start Date  09/03/15   Interventions for Problem One Long Term Goal  RN CM reviewed medications over the phone with patient, sent In Basket, Email and left voicemail for [facility] pharmacist to contact pt today, RN CM reviewed critical importance of taking lactulose and all medications as prescribed, pt verbalizes understanding.   [facility] CM Short Term Goal #1 (0-30 days)  pt will make appointment with primary care MD to be seen within 7 days post hospital follow up.   [facility] CM Short Term Goal #1 Start Date  09/03/15   Interventions for Short Term Goal #1  RN CM ask pt to make appointment with primary MD, pt states she will do this today with what suits her schedule as her daughter and husband transport her to appointments, pt states she can do this on her own.     PLAN See pt for initial home visit next week on 09/10/15, continue weekly transition of care. Collaborate with [facility] pharmacist  Jacqlyn Larsen [facility], [place] Coordinator [phone]

## 2015-09-03 NOTE — Telephone Encounter (Signed)
Patient called and was needing to have her Lactulose refilled. This has been taken care of.

## 2015-09-03 NOTE — Telephone Encounter (Signed)
Savannah Jackson will run out of the Lactulose today and is needing this refilled before we leave.

## 2015-09-03 NOTE — Telephone Encounter (Signed)
A refill has been sent to the patient's pharmacy. 

## 2015-09-05 NOTE — Discharge Summary (Signed)
Physician Discharge Summary  Patient ID: Savannah Jackson MRN: [id number] DOB/AGE: January 04, 1956 59 y.o. Primary Care Physician:FANTA,TESFAYE, MD Admit date: 08/30/2015 Discharge date: 09/05/2015    Discharge Diagnoses:   Active Problems:   Cirrhosis, alcoholic ([place])   Altered mental status   Hepatic encephalopathy ([place])     Medication List    STOP taking these medications        lactulose 10 GM/15ML solution  Commonly known as:  CHRONULAC     metoprolol succinate 50 MG 24 hr tablet  Commonly known as:  TOPROL-XL      TAKE these medications        benzonatate 100 MG capsule  Commonly known as:  TESSALON  Take 1 capsule (100 mg total) by mouth 3 (three) times daily as needed for cough.     folic acid 1 MG tablet  Commonly known as:  FOLVITE  Take 1 tablet (1 mg total) by mouth daily.     furosemide 40 MG tablet  Commonly known as:  LASIX  Take 1 tablet (40 mg total) by mouth daily.     nadolol 20 MG tablet  Commonly known as:  CORGARD  Take 0.5 tablets (10 mg total) by mouth daily.     ondansetron 4 MG tablet  Commonly known as:  ZOFRAN  Take 1 tablet (4 mg total) by mouth every 8 (eight) hours as needed for nausea or vomiting.     oxyCODONE 5 MG immediate release tablet  Commonly known as:  Oxy IR/ROXICODONE  Take 1 tablet (5 mg total) by mouth 3 (three) times daily as needed for moderate pain or severe pain.     pantoprazole 40 MG tablet  Commonly known as:  PROTONIX  Take 1 tablet (40 mg total) by mouth daily.     rifaximin 550 MG Tabs tablet  Commonly known as:  XIFAXAN  Take 550 mg by mouth 2 (two) times daily.     spironolactone 100 MG tablet  Commonly known as:  ALDACTONE  Take 1 tablet (100 mg total) by mouth daily.     thiamine 100 MG tablet  Take 1 tablet (100 mg total) by mouth daily.     traMADol 50 MG tablet  Commonly known as:  ULTRAM  Take 1 tablet (50 mg total) by mouth every 8 (eight) hours as needed for moderate pain.         Discharged Condition: Improved    Consults: Gastroenterology  Significant Diagnostic Studies: Ct Head Wo Contrast  08/30/2015  CLINICAL DATA:  Altered mental status. Reported history of a surgery for brain aneurysm. EXAM: CT HEAD WITHOUT CONTRAST TECHNIQUE: Contiguous axial images were obtained from the base of the skull through the vertex without intravenous contrast. COMPARISON:  None. FINDINGS: Postsurgical changes are noted from prior right frontal craniotomy. Aneurysm clips are noted in the anterior right basilar cistern just to the right of midline. No evidence of parenchymal hemorrhage or extra-axial fluid collection. No mass lesion, mass effect, or midline shift. No CT evidence of acute infarction. There is somewhat prominent symmetric periventricular white matter hypodensity. Cerebral volume is age appropriate. No ventriculomegaly. The visualized paranasal sinuses are essentially clear. The mastoid air cells are unopacified. No evidence of calvarial fracture. IMPRESSION: 1. No acute intracranial hemorrhage. 2. Nonspecific prominent periventricular white matter hypodensity, possibly due to chronic small vessel ischemia, although other etiologies such as demyelination should be considered as these changes are prominent for age. Correlate with brain MRI as clinically warranted. 3. Postsurgical  changes from prior right frontal craniotomy with aneurysm clips in the anterior right basilar cistern. Electronically Signed   By: Savannah Jackson M.D.   On: 08/30/2015 15:10    Lab Results: Basic Metabolic Panel: No results for input(s): NA, K, CL, CO2, GLUCOSE, BUN, CREATININE, CALCIUM, MG, PHOS in the last 72 hours. Liver Function Tests: No results for input(s): AST, ALT, ALKPHOS, BILITOT, PROT, ALBUMIN in the last 72 hours.   CBC: No results for input(s): WBC, NEUTROABS, HGB, HCT, MCV, PLT in the last 72 hours.  No results found for this or any previous visit (from the past 240 hour(s)).    Hospital Course: This is a 59 year old who came in with altered mental status. She is known to have cirrhosis and has not been able to afford some of her medication Xifaxan which may have precipitated the event. She was treated initially with lactulose enemas and then was lactulose by mouth. We attempted to get her help with her medications but have been unable to do so so far. She was markedly improved at the time of discharge.  Discharge Exam: Blood pressure 126/74, pulse 61, temperature 97.7 F (36.5 C), temperature source Oral, resp. rate 18, height 5\' 3"  (1.6 m), weight 64.8 kg (142 lb 13.7 oz), SpO2 97 %. She is awake and alert. She says she is back at her normal mental status.  Disposition: Home we will attempt to obtain her medication from the manufacturer.      Discharge Instructions    AMB Referral to [facility] Management    Complete by:  As directed   Please refer to [place] for Transition of care Please refer to [facility] Pharmacist Patient plan to discharge home [id number] Daughter  Savannah Jackson is good contact [phone]  Please call with any questions or concerns: Savannah Jackson. Savannah Purser, RN, BSN, CCM  [facility] [phone]  Reason for consult:  [facility] active  Expected date of contact:  1-3 days (reserved for hospital discharges)             Signed: Annastasia Jackson L   09/05/2015, 9:51 AM

## 2015-09-09 ENCOUNTER — Telehealth: Payer: Self-pay | Admitting: Radiology

## 2015-09-09 NOTE — Telephone Encounter (Signed)
Dr Laurence Ferrari submitted request to [facility]

## 2015-09-09 NOTE — Telephone Encounter (Signed)
Phone call to inform patient that Dr Laurence Ferrari submitted request for drug company to provide Xifaxan at no charged or reduced charge (request submitted on line approximately 2 weeks ago).  Dr Laurence Ferrari has not received a response.  Patient will check w/ her pharmacy to determine if response received at their location.  She states that she will call back with update.    Savannah Rance Riki Rusk, RN 09/09/2015 12:50 PM

## 2015-09-10 ENCOUNTER — Other Ambulatory Visit: Payer: Self-pay | Admitting: *Deleted

## 2015-09-10 ENCOUNTER — Encounter: Payer: Self-pay | Admitting: *Deleted

## 2015-09-10 NOTE — Patient Outreach (Signed)
[facility]) Care Management   09/10/2015  JACKQUELIN SHANOR 26-Jun-1956 [id number]  Savannah Jackson is an 59 y.o. female  Subjective: Initial home visit with pt, HIPAA verified, daughter and husband present,  Pt states several times during the visit that she needs to leave and get her hair done.  Pt reports she has all medications, her daughter assists her, husband is bil amputee and limited ability to assist, pt states " we have a handicap bathroom"    Objective:   Filed Vitals:   09/10/15 1618  Pulse: 46  Resp: 14  Height: 1.6 m (5\' 3" )  Weight: 147 lb (66.679 kg)  SpO2: 98%  unable to auscultate blood pressure ROS  Physical Exam  Constitutional: She is oriented to person, place, and time. She appears well-developed and well-nourished.  HENT:  Head: Normocephalic.  Neck: Normal range of motion. Neck supple.  Cardiovascular: Regular rhythm.   Heart rate 46-50  Respiratory: Effort normal.  Unable to assess breath sounds, pt had to leave home and visit interrupted.  GI: Soft. Bowel sounds are normal.  Musculoskeletal: Normal range of motion.  Unable to assess edema, pt had to leave home and visit interrupted.  Neurological: She is alert and oriented to person, place, and time.  Skin: Skin is warm and dry.  Psychiatric: She has a normal mood and affect. Her behavior is normal. Thought content normal.  Pt forgetful at times- per family    Current Medications:   Current Outpatient Prescriptions  Medication Sig Dispense Refill  . benzonatate (TESSALON) 100 MG capsule Take 1 capsule (100 mg total) by mouth 3 (three) times daily as needed for cough. 20 capsule 0  . folic acid (FOLVITE) 1 MG tablet Take 1 tablet (1 mg total) by mouth daily. 30 tablet 3  . furosemide (LASIX) 40 MG tablet Take 1 tablet (40 mg total) by mouth daily. 30 tablet 3  . lactulose (CHRONULAC) 10 GM/15ML solution Take 30 mLs (20 g total) by mouth 3 (three) times daily. Four times a day  240 mL 3  . nadolol (CORGARD) 20 MG tablet Take 0.5 tablets (10 mg total) by mouth daily. 30 tablet 3  . ondansetron (ZOFRAN) 4 MG tablet Take 1 tablet (4 mg total) by mouth every 8 (eight) hours as needed for nausea or vomiting. 30 tablet 1  . spironolactone (ALDACTONE) 100 MG tablet Take 1 tablet (100 mg total) by mouth daily. 30 tablet 3  . thiamine 100 MG tablet Take 1 tablet (100 mg total) by mouth daily. 30 tablet 3  . oxyCODONE (OXY IR/ROXICODONE) 5 MG immediate release tablet Take 1 tablet (5 mg total) by mouth 3 (three) times daily as needed for moderate pain or severe pain. (Patient not taking: Reported on 09/10/2015) 40 tablet 0  . pantoprazole (PROTONIX) 40 MG tablet Take 1 tablet (40 mg total) by mouth daily. (Patient not taking: Reported on 09/03/2015) 30 tablet 3  . rifaximin (XIFAXAN) 550 MG TABS tablet Take 550 mg by mouth 2 (two) times daily.    . traMADol (ULTRAM) 50 MG tablet Take 1 tablet (50 mg total) by mouth every 8 (eight) hours as needed for moderate pain. (Patient not taking: Reported on 09/03/2015) 30 tablet 0   No current facility-administered medications for this visit.    Functional Status:   In your present state of health, do you have any difficulty performing the following activities: 09/01/2015 09/01/2015  Hearing? - -  Vision? - -  Difficulty concentrating  or making decisions? - -  Walking or climbing stairs? - -  Dressing or bathing? - -  Doing errands, shopping? N N    Fall/Depression Screening:    PHQ 2/9 Scores 09/10/2015  PHQ - 2 Score 0   Fall Risk  09/10/2015  Falls in the past year? No    Assessment:  Pt is taking metoprolol although discharge summary states not to, see Care plan- RN CM called primary care Dr. Legrand Rams and reported bradycardia and that pt is taking tylenol almost daily, RN CM ask pt not to take tylenol and talk with her doctor at appointment on 09/14/15 about medications, take all bottles to appointment.  See care plan-sent In  Basket to [facility] for medication concerns.  RN CM reviewed disease process cirrhosis and importance of lactulose to keep ammonia levels in check, importance of not drinking any alcohol and healthy eating. See care plan- informed patient's daughter and husband, pt is not to take metoprolol.  Home visit interrupted towards the end of visit as pt stated she had to leave.  RN CM faxed initial home visit and barrier letter to primary MD Dr. Legrand Rams.  THN CM Care Plan Problem One        Most Recent Value   Care Plan Problem One  knowledge deficit related to disease process Cirrhosis   Role Documenting the Problem One  Care Management Coordinator   Care Plan for Problem One  Active   THN Long Term Goal (31-90 days)  pt will have no hospital readmissions within 90 days   THN Long Term Goal Start Date  09/03/15   Interventions for Problem One Long Term Goal  RN CM reviewed all medications with pt and daughter, pt is taking metoprolol although discharge summary says to discontinue, heart rate is 46-50, RN CM reported to Maudie Mercury at Dr. Josephine Cables office and MD instructs pt not to take metoprolol and bring all medications to her appointment next week, RN CM left message with pt daughter [name] to inform and called pt home, spoke with husband and he will let pt know.   THN CM Short Term Goal #1 (0-30 days)  pt will make appointment with primary care MD to be seen within 7 days post hospital follow up.   THN CM Short Term Goal #1 Start Date  09/10/15 [goal restarted]   Interventions for Short Term Goal #1  pt did make appointment for 09/14/15, will complete goal when pt sees MD as she has missed appointments per MD office.    THN CM Care Plan Problem Two        Most Recent Value   Care Plan Problem Two  Pt desires to have increased endurance "and do the things I used to do"   Role Documenting the Problem Two  Care Management Leal for Problem Two  Active   THN CM Short Term Goal #1  (0-30 days)  pt will verbalize increased endurance with activity tolerance within 30 days.   THN CM Short Term Goal #1 Start Date  09/17/15   Interventions for Short Term Goal #2   RN CM reviewed medications and discussed importance of taking as prescribed e.g. (metoprolol- heart rate is low, lactulose- pt ran out before last hospitalization),  ask daughter to have oversight and not let pt run out of any medications,  RN CM sent In Basket to [facility] and reported pt interested in [facility] mail order, still cannot  afford xifixan, has been taking tylenol almost daily and needs reinforcement with medications.  RN CM ask pt to alternate activity with rest, to get outside in sunshine daily if possible, keep all scheduled MD appointments.      Plan:  Continue weekly transition of care calls Follow up with home visit 10/08/15 Reinforce disease process cirrhosis Collaborate with [facility] pharmacist Follow up on primary care visit 09/14/15  Jacqlyn Larsen [facility], [place] Coordinator [phone]

## 2015-09-12 DIAGNOSIS — R402411 Glasgow coma scale score 13-15, in the field [EMT or ambulance]: Secondary | ICD-10-CM | POA: Diagnosis not present

## 2015-09-12 DIAGNOSIS — R5383 Other fatigue: Secondary | ICD-10-CM | POA: Diagnosis not present

## 2015-09-14 ENCOUNTER — Other Ambulatory Visit: Payer: Self-pay

## 2015-09-14 DIAGNOSIS — I10 Essential (primary) hypertension: Secondary | ICD-10-CM | POA: Diagnosis not present

## 2015-09-14 DIAGNOSIS — K5732 Diverticulitis of large intestine without perforation or abscess without bleeding: Secondary | ICD-10-CM | POA: Diagnosis not present

## 2015-09-14 DIAGNOSIS — D684 Acquired coagulation factor deficiency: Secondary | ICD-10-CM | POA: Diagnosis not present

## 2015-09-14 DIAGNOSIS — J90 Pleural effusion, not elsewhere classified: Secondary | ICD-10-CM | POA: Diagnosis not present

## 2015-09-14 DIAGNOSIS — K769 Liver disease, unspecified: Secondary | ICD-10-CM | POA: Diagnosis not present

## 2015-09-14 DIAGNOSIS — K7030 Alcoholic cirrhosis of liver without ascites: Secondary | ICD-10-CM | POA: Diagnosis not present

## 2015-09-14 DIAGNOSIS — R188 Other ascites: Secondary | ICD-10-CM | POA: Diagnosis not present

## 2015-09-14 DIAGNOSIS — F339 Major depressive disorder, recurrent, unspecified: Secondary | ICD-10-CM | POA: Diagnosis not present

## 2015-09-14 DIAGNOSIS — Z23 Encounter for immunization: Secondary | ICD-10-CM | POA: Diagnosis not present

## 2015-09-14 DIAGNOSIS — F1020 Alcohol dependence, uncomplicated: Secondary | ICD-10-CM | POA: Diagnosis not present

## 2015-09-14 DIAGNOSIS — R601 Generalized edema: Secondary | ICD-10-CM | POA: Diagnosis not present

## 2015-09-14 NOTE — Patient Outreach (Signed)
I called Savannah Jackson to discuss an application for Xifaxin that I will be sending her and other pharmacy related issues.  I had to leave a HIPPA compliant message for her to call me back. I will try reaching out to her again today or tomorrow.   Deanne Coffer, PharmD, [place] [phone]

## 2015-09-16 ENCOUNTER — Other Ambulatory Visit: Payer: Self-pay | Admitting: *Deleted

## 2015-09-16 ENCOUNTER — Telehealth (INDEPENDENT_AMBULATORY_CARE_PROVIDER_SITE_OTHER): Payer: Self-pay | Admitting: *Deleted

## 2015-09-16 NOTE — Patient Outreach (Signed)
09/16/15- Telephone call to patient for transition of care week 3, spoke with pt, HIPAA verified, pt report she saw Dr. Legrand Rams Monday 09/13/15 and still instructed not to take metoprolol.  Pt states she has all medications and taking as prescribed, is concerned that she only has one refill left on lactulose and that she only gets 2 day supply and will run completely out by this weekend. RN CM called [facility], spoke with Tiffany who verfies the above information is correct (refill/ dosage information). RN CM called Dr. Olevia Perches office, spoke with Tammy-nurse, reported pt only has one refill on lactulose and receives 2 days worth of medication at a time, Tammy states she will call in one month's supply with a 12 month refill. RN CM called pt back and notified her prescription will be ready for pickup today, pt verbalizes understanding and states she will call RN CM if any further issues.  THN CM Care Plan Problem One        Most Recent Value   Care Plan Problem One  knowledge deficit related to disease process Cirrhosis   Role Documenting the Problem One  Care Management Coordinator   Care Plan for Problem One  Active   THN Long Term Goal (31-90 days)  pt will have no hospital readmissions within 90 days   THN Long Term Goal Start Date  09/03/15   Interventions for Problem One Long Term Goal  RN CM reminded pt of importance of taking all medications as prescribed and picking up prescription for lactulose today.   THN CM Short Term Goal #1 (0-30 days)  pt will make appointment with primary care MD to be seen within 7 days post hospital follow up.   THN CM Short Term Goal #1 Start Date  09/10/15 [goal restarted]   [facility] CM Short Term Goal #1 Met Date  09/16/15 [pt saw MD 09/13/15]      PLAN Continue weekly transition of care calls.  Jacqlyn Larsen [facility], [place] Coordinator [phone]

## 2015-09-16 NOTE — Telephone Encounter (Signed)
Savannah Jackson contacted our office this morning. Patient has note been getting enough Lactulose to last for a month, just enough for about every 2 days. I called [facility] in [place] spoke with [place]. A verbal order was given for Lactulose 30 mLs four times daily - 1 month supply - 11 refills.

## 2015-09-16 NOTE — Telephone Encounter (Signed)
Delete.

## 2015-09-20 ENCOUNTER — Encounter (INDEPENDENT_AMBULATORY_CARE_PROVIDER_SITE_OTHER): Payer: Self-pay | Admitting: Internal Medicine

## 2015-09-20 ENCOUNTER — Ambulatory Visit (INDEPENDENT_AMBULATORY_CARE_PROVIDER_SITE_OTHER): Payer: Commercial Managed Care - HMO | Admitting: Internal Medicine

## 2015-09-20 VITALS — BP 98/70 | HR 60 | Temp 97.8°F | Resp 18 | Ht 63.0 in | Wt 145.3 lb

## 2015-09-20 DIAGNOSIS — M545 Low back pain: Secondary | ICD-10-CM | POA: Diagnosis not present

## 2015-09-20 DIAGNOSIS — K7290 Hepatic failure, unspecified without coma: Secondary | ICD-10-CM

## 2015-09-20 DIAGNOSIS — K7031 Alcoholic cirrhosis of liver with ascites: Secondary | ICD-10-CM

## 2015-09-20 DIAGNOSIS — K7682 Hepatic encephalopathy: Secondary | ICD-10-CM

## 2015-09-20 MED ORDER — FUROSEMIDE 40 MG PO TABS: 20.0000 mg | ORAL_TABLET | Freq: Every day | ORAL | Status: DC

## 2015-09-20 MED ORDER — OXYCODONE HCL 5 MG PO TABS: 5.0000 mg | ORAL_TABLET | Freq: Two times a day (BID) | ORAL | Status: DC | PRN

## 2015-09-20 NOTE — Progress Notes (Signed)
Presenting complaint;  Follow-up for decompensated alcoholic cirrhosis.  Subjective:  Patient is 59 year old African-American female who is alcoholic cirrhosis complicated by ascites and right pleural effusion requiring TIPS for control as well as hepatic encephalopathy. She was hospitalized 3 weeks ago with hepatic encephalopathy as she ran out of her medication and was not able to afford. She responded to therapy. She is back on lactulose but not taking Xifaxan. She has lost 5 pounds since her visit 6 weeks ago. She states she has lost 28 pounds in the last 2 years. However she feels quite well. She has not had any more episodes of confusion. She denies nausea vomiting abdominal pain melena or rectal bleeding. She is having 4-5 bowel movements per day. She complains of 5 pain in her tailbone. She states she fell last week when she was raking leaves. She also complains of knee pain. She has tried Tylenol and it doesn't work. She also has taken tramadol and it does not work. She says she needs some help because she has plans to cook large meal for her family members for Thanksgiving.    Current Medications: Outpatient Encounter Prescriptions as of 09/20/2015  Medication Sig  . folic acid (FOLVITE) 1 MG tablet Take 1 tablet (1 mg total) by mouth daily.  . furosemide (LASIX) 40 MG tablet Take 1 tablet (40 mg total) by mouth daily.  Marland Kitchen lactulose (CHRONULAC) 10 GM/15ML solution Take 30 mLs (20 g total) by mouth 3 (three) times daily. Four times a day  . nadolol (CORGARD) 20 MG tablet Take 0.5 tablets (10 mg total) by mouth daily.  . ondansetron (ZOFRAN) 4 MG tablet Take 1 tablet (4 mg total) by mouth every 8 (eight) hours as needed for nausea or vomiting.  Marland Kitchen oxyCODONE (OXY IR/ROXICODONE) 5 MG immediate release tablet Take 5 mg by mouth as needed.   Marland Kitchen spironolactone (ALDACTONE) 100 MG tablet Take 1 tablet (100 mg total) by mouth daily.  Marland Kitchen thiamine 100 MG tablet Take 1 tablet (100 mg total) by mouth  daily.  . rifaximin (XIFAXAN) 550 MG TABS tablet Take 550 mg by mouth 2 (two) times daily.  . [DISCONTINUED] benzonatate (TESSALON) 100 MG capsule Take 1 capsule (100 mg total) by mouth 3 (three) times daily as needed for cough. (Patient not taking: Reported on 09/20/2015)  . [DISCONTINUED] oxyCODONE (OXY IR/ROXICODONE) 5 MG immediate release tablet Take 1 tablet (5 mg total) by mouth 3 (three) times daily as needed for moderate pain or severe pain. (Patient not taking: Reported on 09/20/2015)  . [DISCONTINUED] pantoprazole (PROTONIX) 40 MG tablet Take 1 tablet (40 mg total) by mouth daily. (Patient not taking: Reported on 09/03/2015)  . [DISCONTINUED] traMADol (ULTRAM) 50 MG tablet Take 1 tablet (50 mg total) by mouth every 8 (eight) hours as needed for moderate pain. (Patient not taking: Reported on 09/03/2015)   No facility-administered encounter medications on file as of 09/20/2015.     Objective: Blood pressure 98/70, pulse 60, temperature 97.8 F (36.6 C), temperature source Oral, resp. rate 18, height 5\' 3"  (1.6 m), weight 145 lb 4.8 oz (65.908 kg). Patient is alert and does not have asterixis. Conjunctiva is pink. Sclera is nonicteric Oropharyngeal mucosa is normal. No neck masses or thyromegaly noted. Cardiac exam with regular rhythm normal S1 and S2. No murmur or gallop noted. Lungs are clear to auscultation. Abdomen is symmetrical. It is very soft on palpation. Spleen is not palpable. Liver edge is palpable midepigastric metformin nontender. No LE edema or clubbing  noted. Examination of lower back reveals no ecchymosis. Mild tenderness noted on percussion over sacrum.  Labs/studies Results: Serum ammonia was 66 on 09/01/2015   H&H was 8.8 and 26.7 on 08/31/2015.  Assessment:  #1. Hepatic encephalopathy. Clinically she does not appear to be encephalopathic. She is not taking Xifaxan because she cannot afford this medication. #2. Ascites. She has lost 5 pounds in the last 5  weeks or so. She does not have ascites on exam. #3. 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 #4. Alcoholic cirrhosis. She has not had any alcohol in over 6 months. She has declined rehabilitation. She has not made up her mind if she wants to be sent for transplant evaluation. #5. Anemia. Anemia felt to be due to chronic disease but MCV is decreasing. Will check iron studies with next blood draw. #6. Acute lower back or sacral pain due to recent fall.   Plan:  Oxycodone 1 tablet by mouth twice a day when necessary. Prescription given for 14 doses. Patient is fully aware that she could become confused and her family needs to know when she takes this medication. Patient advised not to take OTC NSAIDs. She will check with Dr. Josephine Cables office about getting hepatitis a and B vaccination. Decrease furosemide to 20 mg by mouth every morning. Will refer patient for transplant evaluation when she is ready. She will have CBC, serum iron TIBC, ferritin,comprehensive chemistry panel serum ammonia. Patient will call office if she has lost another 5 pounds. Have sent request to manufacture for Xifaxan for patient as she cannot afford this medication. Office visit in 2 months.

## 2015-09-20 NOTE — Patient Instructions (Signed)
Blood work to be done next week. Do not take more than two oxycodone's per day. Please check with Dr. Legrand Rams about getting hepatitis A and B vaccination.

## 2015-09-21 ENCOUNTER — Other Ambulatory Visit: Payer: Self-pay | Admitting: *Deleted

## 2015-09-21 ENCOUNTER — Other Ambulatory Visit: Payer: Self-pay

## 2015-09-21 ENCOUNTER — Telehealth (INDEPENDENT_AMBULATORY_CARE_PROVIDER_SITE_OTHER): Payer: Self-pay | Admitting: *Deleted

## 2015-09-21 DIAGNOSIS — K7031 Alcoholic cirrhosis of liver with ascites: Secondary | ICD-10-CM

## 2015-09-21 NOTE — Patient Outreach (Signed)
I called and spoke with Savannah Jackson in regards to making a home visit.  She stated she would be available tomorrow, November 23 at 9:30 am.  I will review her medications at that time.   Deanne Coffer, PharmD, [place] [phone]

## 2015-09-21 NOTE — Telephone Encounter (Signed)
Per Dr.Rehman the patient will need to have labs drawn. 

## 2015-09-21 NOTE — Patient Outreach (Signed)
09/21/15- Telephone call to patient for transition of care week 4, spoke with pt, HIPAA verified, pt states " I'm doing really well right now, I picked up lactulose from pharmacy and I have several bottles and plenty of refills"  Pt states she will be calling [facility] for refill on nadolol and states " it only costs $4.00 but I don't have any money, I might try to borrow it from somebody" , pt feels she needs assistance from [facility] pharmacist but not CSW at present,  No other issues reported today.  RN CM sent In Basket to [facility] with update and reporting pt still having ongoing issues with affording medication.  THN CM Care Plan Problem One        Most Recent Value   Care Plan Problem One  knowledge deficit related to disease process Cirrhosis   Role Documenting the Problem One  Care Management Coordinator   Care Plan for Problem One  Active   THN Long Term Goal (31-90 days)  pt will have no hospital readmissions within 90 days   THN Long Term Goal Start Date  09/03/15   Interventions for Problem One Long Term Goal  RN CM reinforced importance of taking all medications as prescribed, pt now has several bottles of lactulose, RN CM sent In Basket to [facility] with update reporting pt continues to have difficulty affording some of her medications.   THN CM Short Term Goal #1 Start Date  09/10/15 [goal restarted]   [facility] CM Short Term Goal #1 Met Date  09/16/15 [pt saw MD 09/13/15]      PLAN See pt for home visit 10/08/15 Assess medications Continue reinforcement/ teaching of disease process cirrhosis  Jacqlyn Larsen [facility], [place] Coordinator [phone]

## 2015-09-22 ENCOUNTER — Other Ambulatory Visit: Payer: Self-pay

## 2015-09-24 ENCOUNTER — Inpatient Hospital Stay (HOSPITAL_COMMUNITY)
Admission: EM | Admit: 2015-09-24 | Discharge: 2015-09-28 | DRG: 442 | Disposition: A | Discharge status: Home / Self Care | Payer: Commercial Managed Care - HMO | Attending: Internal Medicine | Admitting: Internal Medicine

## 2015-09-24 ENCOUNTER — Emergency Department (HOSPITAL_COMMUNITY): Payer: Commercial Managed Care - HMO

## 2015-09-24 ENCOUNTER — Encounter (HOSPITAL_COMMUNITY): Payer: Self-pay | Admitting: Emergency Medicine

## 2015-09-24 DIAGNOSIS — N179 Acute kidney failure, unspecified: Secondary | ICD-10-CM | POA: Diagnosis not present

## 2015-09-24 DIAGNOSIS — E860 Dehydration: Secondary | ICD-10-CM | POA: Diagnosis not present

## 2015-09-24 DIAGNOSIS — K7682 Hepatic encephalopathy: Secondary | ICD-10-CM

## 2015-09-24 DIAGNOSIS — R410 Disorientation, unspecified: Secondary | ICD-10-CM | POA: Diagnosis present

## 2015-09-24 DIAGNOSIS — R7989 Other specified abnormal findings of blood chemistry: Secondary | ICD-10-CM | POA: Diagnosis present

## 2015-09-24 DIAGNOSIS — K7290 Hepatic failure, unspecified without coma: Principal | ICD-10-CM

## 2015-09-24 DIAGNOSIS — N390 Urinary tract infection, site not specified: Secondary | ICD-10-CM

## 2015-09-24 DIAGNOSIS — K7030 Alcoholic cirrhosis of liver without ascites: Secondary | ICD-10-CM | POA: Diagnosis present

## 2015-09-24 DIAGNOSIS — R778 Other specified abnormalities of plasma proteins: Secondary | ICD-10-CM | POA: Diagnosis present

## 2015-09-24 DIAGNOSIS — F05 Delirium due to known physiological condition: Secondary | ICD-10-CM | POA: Diagnosis present

## 2015-09-24 DIAGNOSIS — F1010 Alcohol abuse, uncomplicated: Secondary | ICD-10-CM | POA: Diagnosis not present

## 2015-09-24 DIAGNOSIS — Z96651 Presence of right artificial knee joint: Secondary | ICD-10-CM | POA: Diagnosis not present

## 2015-09-24 DIAGNOSIS — Z853 Personal history of malignant neoplasm of breast: Secondary | ICD-10-CM

## 2015-09-24 DIAGNOSIS — Z23 Encounter for immunization: Secondary | ICD-10-CM | POA: Diagnosis not present

## 2015-09-24 DIAGNOSIS — R4182 Altered mental status, unspecified: Secondary | ICD-10-CM | POA: Diagnosis not present

## 2015-09-24 LAB — URINALYSIS, ROUTINE W REFLEX MICROSCOPIC
BILIRUBIN URINE: NEGATIVE
GLUCOSE, UA: NEGATIVE mg/dL
KETONES UR: NEGATIVE mg/dL
Nitrite: NEGATIVE
PROTEIN: NEGATIVE mg/dL
Specific Gravity, Urine: 1.01 (ref 1.005–1.030)
pH: 5.5 (ref 5.0–8.0)

## 2015-09-24 LAB — COMPREHENSIVE METABOLIC PANEL
ALK PHOS: 211 U/L — AB (ref 38–126)
ALT: 32 U/L (ref 14–54)
ANION GAP: 9 (ref 5–15)
AST: 78 U/L — ABNORMAL HIGH (ref 15–41)
Albumin: 1.9 g/dL — ABNORMAL LOW (ref 3.5–5.0)
BUN: 26 mg/dL — ABNORMAL HIGH (ref 6–20)
CALCIUM: 8.4 mg/dL — AB (ref 8.9–10.3)
CO2: 16 mmol/L — AB (ref 22–32)
Chloride: 114 mmol/L — ABNORMAL HIGH (ref 101–111)
Creatinine, Ser: 1.44 mg/dL — ABNORMAL HIGH (ref 0.44–1.00)
GFR, EST AFRICAN AMERICAN: 45 mL/min — AB (ref >60)
GFR, EST NON AFRICAN AMERICAN: 39 mL/min — AB (ref >60)
Glucose, Bld: 103 mg/dL — ABNORMAL HIGH (ref 65–99)
Potassium: 4.9 mmol/L (ref 3.5–5.1)
SODIUM: 139 mmol/L (ref 135–145)
Total Bilirubin: 1.8 mg/dL — ABNORMAL HIGH (ref 0.3–1.2)
Total Protein: 5.7 g/dL — ABNORMAL LOW (ref 6.5–8.1)

## 2015-09-24 LAB — BLOOD GAS, ARTERIAL
Bicarbonate: 18.5 mEq/L — ABNORMAL LOW (ref 20.0–24.0)
O2 SAT: 97.3 %
PCO2 ART: 26.4 mmHg — AB (ref 35.0–45.0)
PH ART: 7.399 (ref 7.350–7.450)
PO2 ART: 103 mmHg — AB (ref 80.0–100.0)
TCO2: 14.7 mmol/L (ref 0–100)

## 2015-09-24 LAB — MRSA PCR SCREENING: MRSA BY PCR: NEGATIVE

## 2015-09-24 LAB — GLUCOSE, CAPILLARY: GLUCOSE-CAPILLARY: 91 mg/dL (ref 65–99)

## 2015-09-24 LAB — LIPASE, BLOOD: Lipase: 113 U/L — ABNORMAL HIGH (ref 11–51)

## 2015-09-24 LAB — TROPONIN I
TROPONIN I: 0.11 ng/mL — AB (ref <0.031)
Troponin I: 0.11 ng/mL — ABNORMAL HIGH (ref <0.031)

## 2015-09-24 LAB — URINE MICROSCOPIC-ADD ON

## 2015-09-24 LAB — CBC WITH DIFFERENTIAL/PLATELET
HCT: 31.3 % — ABNORMAL LOW (ref 36.0–46.0)
Hemoglobin: 10.4 g/dL — ABNORMAL LOW (ref 12.0–15.0)
MCH: 26.3 pg (ref 26.0–34.0)
MCHC: 33.2 g/dL (ref 30.0–36.0)
MCV: 79 fL (ref 78.0–100.0)
PLATELETS: 166 10*3/uL (ref 150–400)
RBC: 3.96 MIL/uL (ref 3.87–5.11)
RDW: 20.4 % — ABNORMAL HIGH (ref 11.5–15.5)
WBC: 7 10*3/uL (ref 4.0–10.5)

## 2015-09-24 LAB — PROTIME-INR
INR: 1.33 (ref 0.00–1.49)
PROTHROMBIN TIME: 16.6 s — AB (ref 11.6–15.2)

## 2015-09-24 LAB — AMMONIA: AMMONIA: 159 umol/L — AB (ref 9–35)

## 2015-09-24 MED ORDER — LORAZEPAM 2 MG/ML IJ SOLN
1.0000 mg | Freq: Once | INTRAMUSCULAR | Status: AC
  Administered 2015-09-24: 1 mg via INTRAMUSCULAR

## 2015-09-24 MED ORDER — DEXTROSE 5 % IV SOLN
1.0000 g | INTRAVENOUS | Status: DC
  Administered 2015-09-25 – 2015-09-27 (×3): 1 g via INTRAVENOUS
  Filled 2015-09-24 (×4): qty 10

## 2015-09-24 MED ORDER — CETYLPYRIDINIUM CHLORIDE 0.05 % MT LIQD
7.0000 mL | Freq: Two times a day (BID) | OROMUCOSAL | Status: DC
  Administered 2015-09-24 – 2015-09-27 (×7): 7 mL via OROMUCOSAL

## 2015-09-24 MED ORDER — LACTULOSE ENEMA
300.0000 mL | Freq: Once | ORAL | Status: AC
  Administered 2015-09-24: 300 mL via RECTAL
  Filled 2015-09-24: qty 300

## 2015-09-24 MED ORDER — METOPROLOL TARTRATE 1 MG/ML IV SOLN
2.5000 mg | Freq: Four times a day (QID) | INTRAVENOUS | Status: DC
  Administered 2015-09-25 (×2): 2.5 mg via INTRAVENOUS
  Filled 2015-09-24 (×3): qty 5

## 2015-09-24 MED ORDER — FUROSEMIDE 10 MG/ML IJ SOLN
20.0000 mg | Freq: Every day | INTRAMUSCULAR | Status: DC
  Administered 2015-09-25 – 2015-09-26 (×2): 20 mg via INTRAVENOUS
  Filled 2015-09-24 (×2): qty 2

## 2015-09-24 MED ORDER — ZIPRASIDONE MESYLATE 20 MG IM SOLR
10.0000 mg | Freq: Once | INTRAMUSCULAR | Status: AC
  Administered 2015-09-24: 10 mg via INTRAMUSCULAR

## 2015-09-24 MED ORDER — ENOXAPARIN SODIUM 40 MG/0.4ML ~~LOC~~ SOLN
40.0000 mg | SUBCUTANEOUS | Status: DC
  Administered 2015-09-24 – 2015-09-27 (×4): 40 mg via SUBCUTANEOUS
  Filled 2015-09-24 (×4): qty 0.4

## 2015-09-24 MED ORDER — HALOPERIDOL LACTATE 5 MG/ML IJ SOLN
5.0000 mg | Freq: Four times a day (QID) | INTRAMUSCULAR | Status: DC | PRN
  Filled 2015-09-24: qty 1

## 2015-09-24 MED ORDER — LORAZEPAM 2 MG/ML IJ SOLN
INTRAMUSCULAR | Status: AC
  Filled 2015-09-24: qty 1

## 2015-09-24 MED ORDER — ZIPRASIDONE MESYLATE 20 MG IM SOLR
INTRAMUSCULAR | Status: AC
  Filled 2015-09-24: qty 20

## 2015-09-24 MED ORDER — PANTOPRAZOLE SODIUM 40 MG IV SOLR
40.0000 mg | INTRAVENOUS | Status: DC
  Administered 2015-09-24 – 2015-09-26 (×3): 40 mg via INTRAVENOUS
  Filled 2015-09-24 (×3): qty 40

## 2015-09-24 MED ORDER — ONDANSETRON HCL 4 MG PO TABS: 4.0000 mg | ORAL_TABLET | Freq: Three times a day (TID) | ORAL | Status: DC | PRN

## 2015-09-24 MED ORDER — SODIUM CHLORIDE 0.9 % IV SOLN: 250.0000 mL | INTRAVENOUS | Status: DC | PRN

## 2015-09-24 MED ORDER — PNEUMOCOCCAL VAC POLYVALENT 25 MCG/0.5ML IJ INJ
0.5000 mL | INJECTION | INTRAMUSCULAR | Status: AC
  Administered 2015-09-25: 0.5 mL via INTRAMUSCULAR
  Filled 2015-09-24: qty 0.5

## 2015-09-24 MED ORDER — ASPIRIN 300 MG RE SUPP
300.0000 mg | RECTAL | Status: AC
  Filled 2015-09-24: qty 1

## 2015-09-24 MED ORDER — ONDANSETRON HCL 4 MG/2ML IJ SOLN
4.0000 mg | Freq: Four times a day (QID) | INTRAMUSCULAR | Status: DC | PRN
  Administered 2015-09-27: 4 mg via INTRAVENOUS
  Filled 2015-09-24: qty 2

## 2015-09-24 MED ORDER — CEFTRIAXONE SODIUM 1 G IJ SOLR
1.0000 g | Freq: Once | INTRAMUSCULAR | Status: AC
  Administered 2015-09-24: 1 g via INTRAVENOUS
  Filled 2015-09-24: qty 10

## 2015-09-24 MED ORDER — INFLUENZA VAC SPLIT QUAD 0.5 ML IM SUSY
0.5000 mL | PREFILLED_SYRINGE | INTRAMUSCULAR | Status: AC
  Administered 2015-09-25: 0.5 mL via INTRAMUSCULAR
  Filled 2015-09-24: qty 0.5

## 2015-09-24 MED ORDER — SODIUM CHLORIDE 0.9 % IV BOLUS (SEPSIS)
1000.0000 mL | Freq: Once | INTRAVENOUS | Status: AC
  Administered 2015-09-24: 1000 mL via INTRAVENOUS

## 2015-09-24 MED ORDER — ASPIRIN 81 MG PO CHEW
324.0000 mg | CHEWABLE_TABLET | ORAL | Status: AC
  Administered 2015-09-24: 324 mg via ORAL
  Filled 2015-09-24: qty 4

## 2015-09-24 MED ORDER — LACTULOSE ENEMA
300.0000 mL | Freq: Four times a day (QID) | ORAL | Status: DC
  Administered 2015-09-25 (×2): 300 mL via RECTAL
  Filled 2015-09-24 (×11): qty 300

## 2015-09-24 MED ORDER — SODIUM CHLORIDE 0.9 % IV SOLN
INTRAVENOUS | Status: AC
  Administered 2015-09-24: 22:00:00 via INTRAVENOUS

## 2015-09-24 NOTE — ED Notes (Signed)
Dr. rancour at bedside.  

## 2015-09-24 NOTE — ED Notes (Signed)
Pt resting quietly at this time with eyes shut.  On monitor and continuous pulse ox.  sats 99-100%.

## 2015-09-24 NOTE — ED Notes (Signed)
Pt's daughter states that pt has been confused today.  States that she has been like this before with elevated ammonia levels.  Vomited her Lactulose this morning.

## 2015-09-24 NOTE — ED Notes (Signed)
In room to do ekg and pt started trying to get up out of bed. Called for assistance.  Pt unable to use bedpan or bsc.  Pt hollaring and up moving around in room.   Dr. Wyvonnia Dusky notified and orders received.

## 2015-09-24 NOTE — H&P (Signed)
History and Physical  Savannah Jackson [id number] DOB: Oct 22, 1956 DOA: 09/24/2015  Referring physician: Dr Wyvonnia Dusky, ED physician PCP: Rosita Fire, MD   Chief Complaint: Confusion, aggitation  HPI: Savannah Jackson is a 59 y.o. 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 Patient has had significantly less hepatic hydrothorax it had been doing fairly well. After the procedure, the patient had been on lactulose 4 times a day. The patient does not eat well due to the lactulose and her primary care physician decreased her lactulose to twice a day approximately one week ago due to weight loss secondary to the decreased appetite. The family notes that she has been acting fairly normal up until this morning. She is uncertain as whether the patient had her lactulose yesterday due to it being Thanksgiving. The family noted received a call from her neighbors saying that she was outside without close on acting very strange. Her family went to her house, put her in the car, brought her to the hospital for evaluation. Emergency room, the patient received Ativan 2 mg and Geodon for agitation and delirium. As result, the patient is very somnolent.   Review of Systems:  Unable to obtain secondary to patient's condition.  Past Medical History  Diagnosis Date  . Hypertension   . Brain aneurysm   . Rotator cuff syndrome of left shoulder   . DDD (degenerative disc disease), lumbar   . Chronic back pain Diverticultis  . Cirrhosis ([place])     alcoholic  . Sleep apnea   . ETOH abuse   . Bilateral leg edema   . Hepatomegaly     scope 2014  . Diverticulosis     scope 2014  . Cancer [facility])     breast cancer   Past Surgical History  Procedure Laterality Date  . Cholecystectomy    . Brain surgery    . Breast surgery    . Total  knee arthroplasty      right. 2002  . Cataract extraction Bilateral     [facility] 2 or 3 years ago  . Colonoscopy N/A 04/10/2013    Procedure: COLONOSCOPY;  Surgeon: Rogene Houston, MD;  [facility];  Service: Endoscopy;  Laterality: N/A;  1030-rescheduled to 1200 Ann notified pt  . Esophagogastroduodenoscopy N/A 04/22/2015    Procedure: ESOPHAGOGASTRODUODENOSCOPY (EGD);  Surgeon: Rogene Houston, MD;  [facility];  Service: Endoscopy;  Laterality: N/A;  . Radiology with anesthesia N/A 07/16/2015    Procedure: TIPS;  Surgeon: Medication Radiologist, MD;  Location: [facility];  Service: Radiology;  Laterality: N/A;   Social History:  reports that she has never smoked. She has never used smokeless tobacco. She reports that she does not drink alcohol or use illicit drugs. Patient lives at home & is able to participate in activities of daily living.  Allergies  Allergen Reactions  . Latex Swelling    Discoloration of skin.   . Vicodin [Hydrocodone-Acetaminophen] Itching    Family history of cirrhosis and brain aneurysm  Prior to Admission medications   Medication Sig Start Date End Date Taking? Authorizing Provider  folic acid (FOLVITE) 1 MG tablet Take 1 tablet (1 mg total) by mouth daily. 04/29/15  Yes Rosita Fire, MD  furosemide (LASIX) 40 MG tablet Take 20 mg by mouth daily.    Yes Historical Provider,  MD  lactulose (CHRONULAC) 10 GM/15ML solution Take 30 mLs (20 g total) by mouth 3 (three) times daily. Four times a day 09/03/15  Yes Rogene Houston, MD  nadolol (CORGARD) 20 MG tablet Take 0.5 tablets (10 mg total) by mouth daily. 07/18/15  Yes Ripudeep Krystal Eaton, MD  ondansetron (ZOFRAN) 4 MG tablet Take 1 tablet (4 mg total) by mouth every 8 (eight) hours as needed for nausea or vomiting. 08/10/15  Yes Butch Penny, NP  oxyCODONE (OXY IR/ROXICODONE) 5 MG immediate release tablet Take 1 tablet (5 mg total) by mouth 2 (two) times daily as needed. Patient taking differently:  Take 5 mg by mouth 2 (two) times daily as needed for moderate pain or severe pain.  09/20/15  Yes Rogene Houston, MD  spironolactone (ALDACTONE) 100 MG tablet Take 1 tablet (100 mg total) by mouth daily. 07/18/15  Yes Ripudeep Krystal Eaton, MD  thiamine 100 MG tablet Take 1 tablet (100 mg total) by mouth daily. 04/29/15  Yes Rosita Fire, MD  rifaximin (XIFAXAN) 550 MG TABS tablet Take 550 mg by mouth 2 (two) times daily.    Jacqulynn Cadet, MD    Physical Exam: BP 135/87 mmHg  Pulse 59  Resp 20  Ht 5\' 3"  (1.6 m)  Wt 66.679 kg (147 lb)  BMI 26.05 kg/m2  SpO2 100%  General: Middle-aged black female who appears older than stated age.  Patient somnolent. No acute cardiopulmonary distress.  Eyes: Pupils equal, round, reactive to light. Extraocular muscles are intact. Sclerae icteric and noninjected.  ENT:  Moist mucosal membranes. No mucosal lesions.   Neck: Neck supple without lymphadenopathy. No carotid bruits. No masses palpated.  Cardiovascular: Regular rate with normal S1-S2 sounds. No murmurs, rubs, gallops auscultated. No JVD.  Respiratory: Good respiratory effort with no wheezes, rales, rhonchi. Lungs clear to auscultation bilaterally.  Abdomen: Soft, nontender, nondistended. Active bowel sounds. No masses or hepatosplenomegaly  Skin: Dry, warm to touch. 2+ dorsalis pedis and radial pulses. Musculoskeletal: No calf or leg pain. All major joints not erythematous nontender.  Psychiatric: Unable to assess.  Neurologic: Unable to assess given the patient's hypersomnolence inability to follow commands due to sedated state. Per emergency doctor, the patient does have asterixis           Labs on Admission:  Basic Metabolic Panel:  Recent Labs Lab 09/24/15 1715  NA 139  K 4.9  CL 114*  CO2 16*  GLUCOSE 103*  BUN 26*  CREATININE 1.44*  CALCIUM 8.4*   Liver Function Tests:  Recent Labs Lab 09/24/15 1715  AST 78*  ALT 32  ALKPHOS 211*  BILITOT 1.8*  PROT 5.7*  ALBUMIN 1.9*     Recent Labs Lab 09/24/15 1715  LIPASE 113*    Recent Labs Lab 09/24/15 1715  AMMONIA 159*   CBC:  Recent Labs Lab 09/24/15 1610  WBC 7.0  HGB 10.4*  HCT 31.3*  MCV 79.0  PLT 166   Cardiac Enzymes:  Recent Labs Lab 09/24/15 1715  TROPONINI 0.11*    BNP (last 3 results)  Recent Labs  07/03/15 1330  BNP 241.0*    ProBNP (last 3 results) No results for input(s): PROBNP in the last 8760 hours.  CBG: No results for input(s): GLUCAP in the last 168 hours.  Radiological Exams on Admission: Ct Head Wo Contrast  09/24/2015  CLINICAL DATA:  Altered mental status today. EXAM: CT HEAD WITHOUT CONTRAST TECHNIQUE: Contiguous axial images were obtained from the base of the  skull through the vertex without intravenous contrast. COMPARISON:  Head CT 08/30/2015 FINDINGS: Motion throughout exam as multiple scan attempts. 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 Ventricles, cisterns and other CSF spaces are within normal. 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 There is no mass, mass effect, shift of midline structures or acute hemorrhage. No evidence of acute infarction. Remainder the exam is unchanged. IMPRESSION: No acute intracranial findings. 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 Electronically Signed   By: Marin Olp M.D.   On: 09/24/2015 18:03   Dg Chest Portable 1 View  09/24/2015  CLINICAL DATA:  Altered mental status today. EXAM: PORTABLE CHEST 1 VIEW COMPARISON:  08/03/2015 FINDINGS: Lungs are adequately inflated without consolidation. Apparent resolution of previously noted right pleural effusion. Mild  cardiomegaly. Remainder of the exam is unchanged. IMPRESSION: No active disease. Electronically Signed   By: Marin Olp M.D.   On: 09/24/2015 18:06    EKG: Independently reviewed. Sinus rhythm with normal intervals. No ST elevation or depression.  Assessment/Plan Present on Admission:  . Hepatic encephalopathy ([place]) . UTI (lower urinary tract infection) . Delirium due to another medical condition . Acute renal injury ([place]) . Elevated troponin  This patient was discussed with the ED physician, including pertinent vitals, physical exam findings, labs, and imaging.  We also discussed care given by the ED provider.  #1 acute delirium   Admit to stepdown  Secondary to hepatic encephalopathy  Haldol 5 mg IV every 6 hours when necessary for agitation  We'll attempt to redirect #2 hepatic encephalopathy  Lactulose enema 200 mg every 6 hours  Transition to by mouth when able to take  Check ammonia level in the morning  Check metabolic panel in the morning #3 UTI  Ceftriaxone 1 g every 24 hours  Urine culture sent #4 acute renal injury  IV fluids to 5 miles per hour  Recheck creatinine in the morning #5 elevated troponin  Likely secondary to above medical problems  Recheck troponin now and every 6 hours  DVT prophylaxis: Lovenox  Consultants: None  Code Status: Full code  Family Communication:  Daughter and son-in-law in the room  Disposition Plan: [place], [place] [facility] Pager [phone]

## 2015-09-24 NOTE — ED Provider Notes (Signed)
CSN: [id number]     Arrival date & time 09/24/15  1539 History   First MD Initiated Contact with Patient 09/24/15 1601     Chief Complaint  Patient presents with  . Altered Mental Status     (Consider location/radiation/quality/duration/timing/severity/associated sxs/prior Treatment) HPI Comments: Level V caveat for altered mental status. Patient is combative and agitated. Family at bedside reports she has been confused since this morning with nausea and vomiting. They think it is for elevated ammonia level. If she vomited up her medications today but otherwise been taking them. No reported fever. No falls or trauma. Similar presentation about one month ago due to hepatic encephalopathy. Patient also with history of brain aneurysm and cirrhosis and previous breast cancer.  The history is provided by the patient and a relative. The history is limited by the condition of the patient.    Past Medical History  Diagnosis Date  . Hypertension   . Brain aneurysm   . Rotator cuff syndrome of left shoulder   . DDD (degenerative disc disease), lumbar   . Chronic back pain Diverticultis  . Cirrhosis ([place])     alcoholic  . Sleep apnea   . ETOH abuse   . Bilateral leg edema   . Hepatomegaly     scope 2014  . Diverticulosis     scope 2014  . Cancer [facility])     breast cancer   Past Surgical History  Procedure Laterality Date  . Cholecystectomy    . Brain surgery    . Breast surgery    . Total knee arthroplasty      right. 2002  . Cataract extraction Bilateral     [facility] 2 or [facility] years ago  . Colonoscopy N/A 04/10/2013    Procedure: COLONOSCOPY;  Surgeon: Rogene Houston, MD;  [facility];  Service: Endoscopy;  Laterality: N/A;  1030-rescheduled to 1200 Ann notified pt  . Esophagogastroduodenoscopy N/A 04/22/2015    Procedure: ESOPHAGOGASTRODUODENOSCOPY (EGD);  Surgeon: Rogene Houston, MD;  [facility];  Service: Endoscopy;  Laterality: N/A;  . Radiology with  anesthesia N/A 07/16/2015    Procedure: TIPS;  Surgeon: Medication Radiologist, MD;  Location: [place];  Service: Radiology;  Laterality: N/A;   History reviewed. No pertinent family history. Social History  Substance Use Topics  . Smoking status: Never Smoker   . Smokeless tobacco: Never Used  . Alcohol Use: No     Comment: none since 03/2015   OB History    Gravida Para Term Preterm AB TAB SAB Ectopic Multiple Living            1     Review of Systems  Unable to perform ROS: Mental status change      Allergies  Latex and Vicodin  Home Medications   Prior to Admission medications   Medication Sig Start Date End Date Taking? Authorizing Provider  folic acid (FOLVITE) 1 MG tablet Take 1 tablet (1 mg total) by mouth daily. 04/29/15  Yes Rosita Fire, MD  furosemide (LASIX) 40 MG tablet Take 20 mg by mouth daily.    Yes Historical Provider, MD  lactulose (CHRONULAC) 10 GM/15ML solution Take 30 mLs (20 g total) by mouth 3 (three) times daily. Four times a day 09/03/15  Yes Rogene Houston, MD  nadolol (CORGARD) 20 MG tablet Take 0.5 tablets (10 mg total) by mouth daily. 07/18/15  Yes Ripudeep Krystal Eaton, MD  ondansetron (ZOFRAN) 4 MG tablet Take 1 tablet (4  mg total) by mouth every 8 (eight) hours as needed for nausea or vomiting. 08/10/15  Yes Butch Penny, NP  oxyCODONE (OXY IR/ROXICODONE) 5 MG immediate release tablet Take 1 tablet (5 mg total) by mouth 2 (two) times daily as needed. Patient taking differently: Take 5 mg by mouth 2 (two) times daily as needed for moderate pain or severe pain.  09/20/15  Yes Rogene Houston, MD  spironolactone (ALDACTONE) 100 MG tablet Take 1 tablet (100 mg total) by mouth daily. 07/18/15  Yes Ripudeep Krystal Eaton, MD  thiamine 100 MG tablet Take 1 tablet (100 mg total) by mouth daily. 04/29/15  Yes Rosita Fire, MD  rifaximin (XIFAXAN) 550 MG TABS tablet Take 550 mg by mouth 2 (two) times daily.    Jacqulynn Cadet, MD   BP 104/73 mmHg  Pulse 54  Resp 16   Ht 5\' 3"  (1.6 m)  Wt 144 lb 6.4 oz (65.5 kg)  BMI 25.59 kg/m2  SpO2 100% Physical Exam  Constitutional:  Combative, agitated, uncooperative, trying out of bed  HENT:  Head: Normocephalic.  Mouth/Throat: Oropharynx is clear and moist. No oropharyngeal exudate.  Eyes: Conjunctivae and EOM are normal. Pupils are equal, round, and reactive to light.  Neck: Normal range of motion. Neck supple.  Cardiovascular: Normal rate, regular rhythm and normal heart sounds.   No murmur heard. Pulmonary/Chest: Effort normal and breath sounds normal. No respiratory distress. She exhibits no tenderness.  Abdominal: Soft. There is no tenderness. There is no rebound and no guarding.  Musculoskeletal: Normal range of motion. She exhibits no edema or tenderness.  Neurological:  Not answering questions, screaming and belligerent. Moving all extremities equally. Positive asterixis    ED Course  Procedures (including critical care time) Labs Review Labs Reviewed  CBC WITH DIFFERENTIAL/PLATELET - Abnormal; Notable for the following:    Hemoglobin 10.4 (*)    HCT 31.3 (*)    RDW 20.4 (*)    All other components within normal limits  COMPREHENSIVE METABOLIC PANEL - Abnormal; Notable for the following:    Chloride 114 (*)    CO2 16 (*)    Glucose, Bld 103 (*)    BUN 26 (*)    Creatinine, Ser 1.44 (*)    Calcium 8.4 (*)    Total Protein 5.7 (*)    Albumin 1.9 (*)    AST 78 (*)    Alkaline Phosphatase 211 (*)    Total Bilirubin 1.8 (*)    GFR calc non Af Amer 39 (*)    GFR calc Af Amer 45 (*)    All other components within normal limits  PROTIME-INR - Abnormal; Notable for the following:    Prothrombin Time 16.6 (*)    All other components within normal limits  AMMONIA - Abnormal; Notable for the following:    Ammonia 159 (*)    All other components within normal limits  URINALYSIS, ROUTINE W REFLEX MICROSCOPIC (NOT AT [facility]) - Abnormal; Notable for the following:    APPearance HAZY (*)    Hgb  urine dipstick TRACE (*)    Leukocytes, UA MODERATE (*)    All other components within normal limits  LIPASE, BLOOD - Abnormal; Notable for the following:    Lipase 113 (*)    All other components within normal limits  BLOOD GAS, ARTERIAL - Abnormal; Notable for the following:    pCO2 arterial 26.4 (*)    pO2, Arterial 103 (*)    Bicarbonate 18.5 (*)  All other components within normal limits  TROPONIN I - Abnormal; Notable for the following:    Troponin I 0.11 (*)    All other components within normal limits  URINE MICROSCOPIC-ADD ON - Abnormal; Notable for the following:    Squamous Epithelial / LPF 6-30 (*)    Bacteria, UA MANY (*)    All other components within normal limits  TROPONIN I - Abnormal; Notable for the following:    Troponin I 0.11 (*)    All other components within normal limits  MRSA PCR SCREENING  URINE CULTURE  GLUCOSE, CAPILLARY  TROPONIN I  TROPONIN I  CBC  BASIC METABOLIC PANEL  AMMONIA    Imaging Review Ct Head Wo Contrast  09/24/2015  CLINICAL DATA:  Altered mental status today. EXAM: CT HEAD WITHOUT CONTRAST TECHNIQUE: Contiguous axial images were obtained from the base of the skull through the vertex without intravenous contrast. COMPARISON:  Head CT 08/30/2015 FINDINGS: Motion throughout exam as multiple scan attempts. 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 Ventricles, cisterns and other CSF spaces are within normal. 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 There is no mass, mass effect, shift of midline structures or acute hemorrhage. No evidence of acute infarction. Remainder the exam is unchanged. IMPRESSION: No acute intracranial findings. 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 Postsurgical  change compatible with previous aneurysm clipping over the right side of the circle of Willis. Electronically Signed   By: Marin Olp M.D.   On: 09/24/2015 18:03   Dg Chest Portable 1 View  09/24/2015  CLINICAL DATA:  Altered mental status today. EXAM: PORTABLE CHEST 1 VIEW COMPARISON:  08/03/2015 FINDINGS: Lungs are adequately inflated without consolidation. Apparent resolution of previously noted right pleural effusion. Mild cardiomegaly. Remainder of the exam is unchanged. IMPRESSION: No active disease. Electronically Signed   By: Marin Olp M.D.   On: 09/24/2015 18:06   I have personally reviewed and evaluated these images and lab results as part of my medical decision-making.   EKG Interpretation   Date/Time:  Friday September 24 2015 17:12:57 EST Ventricular Rate:  56 PR Interval:  150 QRS Duration: 98 QT Interval:  480 QTC Calculation: 463 R Axis:   35 Text Interpretation:  Sinus rhythm Repol abnrm, global ischemia, diffuse  leads diffuse T wave inversions No significant change was found Confirmed  by Wyvonnia Dusky  MD, Earnestine Tuohey [phone]) on 09/24/2015 5:23:52 PM      MDM   Final diagnoses:  Hepatic encephalopathy (HCC)  Urinary tract infection without hematuria, site unspecified   Altered mental status with high suspicion for hepatic encephalopathy. Patient sedated on arrival for safety of patient and staff.  EKG shows significant diffuse T-wave inversions are unchanged from previous. Patient is sedated with Ativan and Geodon for patient and staff safety. She is more calm at this time.  Workup is significant for urinary tract infection as well as elevated ammonia. She is not awake enough to take by mouth lactulose will be given enema. CT head is negative.  Mild troponin elevation likely due to ongoing medical problems. EKG abnormal but unchanged. No complaint of chest pain. Cr slightly worse, hydration.  Admission dw Dr. Nehemiah Settle.  CRITICAL CARE Performed by: Ezequiel Essex Total critical care time: 45 minutes Critical care time was exclusive of separately billable procedures and treating other patients. Critical care was  necessary to treat or prevent imminent or life-threatening deterioration. Critical care was time spent personally by me on the following activities: development of treatment plan with patient and/or surrogate as well as nursing, discussions with consultants, evaluation of patient's response to treatment, examination of patient, obtaining history from patient or surrogate, ordering and performing treatments and interventions, ordering and review of laboratory studies, ordering and review of radiographic studies, pulse oximetry and re-evaluation of patient's condition.     Ezequiel Essex, MD 09/25/15 0130

## 2015-09-24 NOTE — Progress Notes (Signed)
Flexi-seal inserted and immediately returned liquid brown stool. Insertion protocol followed.

## 2015-09-24 NOTE — Patient Outreach (Signed)
Subjective: Ms. Ottmar is a 59 year old female referred to pharmacy to assist with obtaining Xifaxan.  She has not had it for a few months.  She needs it to help prevent hepatic encephalopathy.  She had paperwork from her provider which she needed to complete.  I was able to help her complete the paperwork and get together the financial documents she needed to send in with the paperwork.  I discussed the option of [employer] to assist with cost.  She stated she did not have a credit card which is required to set up the account.  She stated she did have a debit card but she was not willing to set up the account today.  She said she would think about it and let me know when she was interested.    Objective: Outpatient Encounter Prescriptions as of 09/22/2015  Medication Sig Note  . folic acid (FOLVITE) 1 MG tablet Take 1 tablet (1 mg total) by mouth daily.   . furosemide (LASIX) 40 MG tablet Take 20 mg by mouth daily.    Marland Kitchen lactulose (CHRONULAC) 10 GM/15ML solution Take 30 mLs (20 g total) by mouth 3 (three) times daily. Four times a day   . nadolol (CORGARD) 20 MG tablet Take 0.5 tablets (10 mg total) by mouth daily.   . ondansetron (ZOFRAN) 4 MG tablet Take 1 tablet (4 mg total) by mouth every 8 (eight) hours as needed for nausea or vomiting.   Marland Kitchen spironolactone (ALDACTONE) 100 MG tablet Take 1 tablet (100 mg total) by mouth daily.   Marland Kitchen thiamine 100 MG tablet Take 1 tablet (100 mg total) by mouth daily.   Marland Kitchen oxyCODONE (OXY IR/ROXICODONE) 5 MG immediate release tablet Take 1 tablet (5 mg total) by mouth 2 (two) times daily as needed. (Patient not taking: Reported on 09/22/2015)   . rifaximin (XIFAXAN) 550 MG TABS tablet Take 550 mg by mouth 2 (two) times daily. 09/20/2015: We are working with the company for assistance.  . [DISCONTINUED] furosemide (LASIX) 40 MG tablet Take 0.5 tablets (20 mg total) by mouth daily. (Patient taking differently: Take 40 mg by mouth daily. )    No  facility-administered encounter medications on file as of 09/22/2015.    Assessment: 1.  Medication adherence with Xifaxan:  Ms. Vest had completed the paperwork for Xifaxan with a few mistakes.  I was able to assist with filling it out correctly.  2.  [employer]:  Ms. Simino is not ready to enroll into [facility] Mail Order.   Plan: 1.  Ms. Jaggard completed the paperwork for Xifaxan and had the financial forms to turn in with it.  She will bring the form and financial information to her provider to be mailed off next week.   2.  I will follow up with a phone call with Ms. Tallman in a month to see if she had heard anything from the company about the [place].   3.  I am available to assist with enrolling in [place] when she is ready.  4.  I will let Jacqlyn Larsen, RN know of the visit as well.    THN CM Care Plan Problem One        Most Recent Value   Care Plan Problem One  Medication adherence   Role Documenting the Problem One  Clinical Pharmacist   Care Plan for Problem One  Active   THN Long Term Goal (31-90 days)  Ms. Accardi will have  Xifaxin in the next 90 days evident by patient report   THN Long Term Goal Start Date  09/22/15   Interventions for Problem One Long Term Goal  I helped Ms. Pressley complete the paperwork needed to apply for the Xifaxin.  She will take it to her provider on Monday, September 27, 2015 when she gets her blood work done.    THN CM Short Term Goal #1 (0-30 days)  Ms. Fein will take her paperwork to her provider to apply for the Xifaxin in the next 30 days evident by patient report.    THN CM Short Term Goal #1 Start Date  09/21/15   Interventions for Short Term Goal #1  I helped Ms. Nienaber complete the paperwork for the Xifaxin.  She will take it to ther provider in the next 30 days.      Deanne Coffer, PharmD, [place] [phone]

## 2015-09-24 NOTE — ED Notes (Signed)
Pt up in the room.  Unable to keep pt in bed.  Pt hollaring.  Dr. Wyvonnia Dusky at bedside.  Orders received for geodon.

## 2015-09-24 NOTE — ED Notes (Signed)
Pt combative. Screaming and hitting at staff

## 2015-09-24 NOTE — ED Notes (Signed)
Enema performed w/o rectal tube. Most of the medication was not retained & just ran out.

## 2015-09-24 NOTE — ED Notes (Signed)
Family notified nurse that they were leaving and would be back soon.

## 2015-09-24 NOTE — Progress Notes (Signed)
Foley catheter inserted, 1 attempt hazy yellow urine returned 650 mL. Protocol for insertion followed.

## 2015-09-25 DIAGNOSIS — Z96651 Presence of right artificial knee joint: Secondary | ICD-10-CM | POA: Diagnosis not present

## 2015-09-25 DIAGNOSIS — N179 Acute kidney failure, unspecified: Secondary | ICD-10-CM | POA: Diagnosis not present

## 2015-09-25 DIAGNOSIS — F1010 Alcohol abuse, uncomplicated: Secondary | ICD-10-CM | POA: Diagnosis not present

## 2015-09-25 DIAGNOSIS — K7030 Alcoholic cirrhosis of liver without ascites: Secondary | ICD-10-CM | POA: Diagnosis not present

## 2015-09-25 DIAGNOSIS — F05 Delirium due to known physiological condition: Secondary | ICD-10-CM | POA: Diagnosis not present

## 2015-09-25 DIAGNOSIS — E860 Dehydration: Secondary | ICD-10-CM | POA: Diagnosis not present

## 2015-09-25 DIAGNOSIS — N390 Urinary tract infection, site not specified: Secondary | ICD-10-CM | POA: Diagnosis not present

## 2015-09-25 DIAGNOSIS — K7290 Hepatic failure, unspecified without coma: Secondary | ICD-10-CM | POA: Diagnosis not present

## 2015-09-25 DIAGNOSIS — Z853 Personal history of malignant neoplasm of breast: Secondary | ICD-10-CM | POA: Diagnosis not present

## 2015-09-25 LAB — BASIC METABOLIC PANEL
ANION GAP: 2 — AB (ref 5–15)
BUN: 23 mg/dL — ABNORMAL HIGH (ref 6–20)
CO2: 17 mmol/L — AB (ref 22–32)
Calcium: 7.9 mg/dL — ABNORMAL LOW (ref 8.9–10.3)
Chloride: 124 mmol/L — ABNORMAL HIGH (ref 101–111)
Creatinine, Ser: 1.09 mg/dL — ABNORMAL HIGH (ref 0.44–1.00)
GFR calc Af Amer: >60 mL/min (ref >60)
GFR calc non Af Amer: 54 mL/min — ABNORMAL LOW (ref >60)
GLUCOSE: 79 mg/dL (ref 65–99)
POTASSIUM: 5.1 mmol/L (ref 3.5–5.1)
Sodium: 143 mmol/L (ref 135–145)

## 2015-09-25 LAB — CBC
HEMATOCRIT: 28.5 % — AB (ref 36.0–46.0)
Hemoglobin: 9.3 g/dL — ABNORMAL LOW (ref 12.0–15.0)
MCH: 25.8 pg — ABNORMAL LOW (ref 26.0–34.0)
MCHC: 32.6 g/dL (ref 30.0–36.0)
MCV: 78.9 fL (ref 78.0–100.0)
Platelets: 151 10*3/uL (ref 150–400)
RBC: 3.61 MIL/uL — AB (ref 3.87–5.11)
RDW: 20.5 % — AB (ref 11.5–15.5)
WBC: 5.8 10*3/uL (ref 4.0–10.5)

## 2015-09-25 LAB — TROPONIN I
TROPONIN I: 0.09 ng/mL — AB (ref <0.031)
TROPONIN I: 0.11 ng/mL — AB (ref <0.031)

## 2015-09-25 LAB — AMMONIA: Ammonia: 48 umol/L — ABNORMAL HIGH (ref 9–35)

## 2015-09-25 MED ORDER — LACTULOSE 10 GM/15ML PO SOLN
30.0000 g | Freq: Four times a day (QID) | ORAL | Status: DC
  Administered 2015-09-25 – 2015-09-28 (×12): 30 g via ORAL
  Filled 2015-09-25 (×13): qty 60

## 2015-09-25 MED ORDER — ENSURE ENLIVE PO LIQD
237.0000 mL | Freq: Two times a day (BID) | ORAL | Status: DC
  Administered 2015-09-26 – 2015-09-28 (×5): 237 mL via ORAL

## 2015-09-25 NOTE — Progress Notes (Signed)
[facility] for Renal Adjustment of ABX if needed  Allergies  Allergen Reactions  . Latex Swelling    Discoloration of skin.   . Vicodin [Hydrocodone-Acetaminophen] Itching   Patient Measurements: Height: 5\' 3"  (160 cm) Weight: 140 lb 14 oz (63.9 kg) IBW/kg (Calculated) : 52.4  Vital Signs: Temp: 96.4 F (35.8 C) (11/26 0627) Temp Source: Axillary (11/26 0627) BP: 129/77 mmHg (11/26 0700) Pulse Rate: 51 (11/26 0700)  Labs:  Recent Labs  09/24/15 1610 09/24/15 1715 09/25/15 0420  WBC 7.0  --  5.8  HGB 10.4*  --  9.3*  PLT 166  --  151  CREATININE  --  1.44* 1.09*    No results for input(s): VANCOTROUGH, VANCOPEAK, VANCORANDOM, GENTTROUGH, GENTPEAK, GENTRANDOM, TOBRATROUGH, TOBRAPEAK, TOBRARND, AMIKACINPEAK, AMIKACINTROU, AMIKACIN in the last 72 hours.   Medical History: Past Medical History  Diagnosis Date  . Hypertension   . Brain aneurysm   . Rotator cuff syndrome of left shoulder   . DDD (degenerative disc disease), lumbar   . Chronic back pain Diverticultis  . 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 Sleep apnea   . ETOH abuse   . Bilateral leg edema   . Hepatomegaly     scope 2014  . Diverticulosis     scope 2014  . Cancer [facility])     breast cancer   Assessment: UTI  Ceftriaxone 1 g every 24 hours (no renal adjustment needed)  Urine culture sent Estimated Creatinine Clearance: 50 mL/min (by C-G formula based on Cr of 1.09).  Plan: Continue current Rx  Hart Robinsons A, [place] 09/25/2015,9:43 AM

## 2015-09-25 NOTE — Progress Notes (Signed)
Subjective: Patient was readmitted due to hepatic encephalopathy. Patient is a known case of liver cirrhosis with history recurrent hepatic encephalopathy. She was recently seen in the office due to recurrent diarrhea and weight. Her lactulose dose decreased was decreased but yesterday she developed confusion and agitation. Patient is awake this morning but still letargic. Objective: Vital signs in last 24 hours: Temp:  [96.4 F (35.8 C)-96.8 F (36 C)] 96.8 F (36 C) (11/26 0800) Pulse Rate:  [51-66] 51 (11/26 0700) Resp:  [11-20] 12 (11/26 0700) BP: (104-152)/(66-106) 129/77 mmHg (11/26 0700) SpO2:  [95 %-100 %] 100 % (11/26 0700) FiO2 (%):  [28 %] 28 % (11/26 0000) Weight:  [63.9 kg (140 lb 14 oz)-66.679 kg (147 lb)] 63.9 kg (140 lb 14 oz) (11/26 7322) Weight change:  Last BM Date: 09/25/15  Intake/Output from previous day: 11/25 0701 - 11/26 0700 In: 1188.8 [I.V.:1188.8] Out: 1500 [Urine:1500]  PHYSICAL EXAM General appearance: cachectic and slowed mentation Resp: diminished breath sounds bilaterally and rhonchi bilaterally Cardio: S1, S2 normal GI: soft and lax, bowel sound is postive Extremities: extremities normal, atraumatic, no cyanosis or edema  Lab Results:  Results for orders placed or performed during the hospital encounter of 09/24/15 (from the past 48 hour(s))  CBC with Differential/Platelet     Status: Abnormal   Collection Time: 09/24/15  4:10 PM  Result Value Ref Range   WBC 7.0 4.0 - 10.5 K/uL   RBC 3.96 3.87 - 5.11 MIL/uL   Hemoglobin 10.4 (L) 12.0 - 15.0 g/dL   HCT 31.3 (L) 36.0 - 46.0 %   MCV 79.0 78.0 - 100.0 fL   MCH 26.3 26.0 - 34.0 pg   MCHC 33.2 30.0 - 36.0 g/dL   RDW 20.4 (H) 11.5 - 15.5 %   Platelets 166 150 - 400 K/uL  Protime-INR     Status: Abnormal   Collection Time: 09/24/15  4:10 PM  Result Value Ref Range   Prothrombin Time 16.6 (H) 11.6 - 15.2 seconds   INR 1.33 0.00 - 1.49  Comprehensive metabolic panel     Status: Abnormal    Collection Time: 09/24/15  5:15 PM  Result Value Ref Range   Sodium 139 135 - 145 mmol/L   Potassium 4.9 3.5 - 5.1 mmol/L   Chloride 114 (H) 101 - 111 mmol/L   CO2 16 (L) 22 - 32 mmol/L   Glucose, Bld 103 (H) 65 - 99 mg/dL   BUN 26 (H) 6 - 20 mg/dL   Creatinine, Ser 1.44 (H) 0.44 - 1.00 mg/dL   Calcium 8.4 (L) 8.9 - 10.3 mg/dL   Total Protein 5.7 (L) 6.5 - 8.1 g/dL   Albumin 1.9 (L) 3.5 - 5.0 g/dL   AST 78 (H) 15 - 41 U/L   ALT 32 14 - 54 U/L   Alkaline Phosphatase 211 (H) 38 - 126 U/L   Total Bilirubin 1.8 (H) 0.3 - 1.2 mg/dL   GFR calc non Af Amer 39 (L) >60 mL/min   GFR calc Af Amer 45 (L) >60 mL/min    Comment: (NOTE) The eGFR has been calculated using the CKD EPI equation. This calculation has not been validated in all clinical situations. eGFR's persistently <60 mL/min signify possible Chronic Kidney Disease.    Anion gap 9 5 - 15  Ammonia     Status: Abnormal   Collection Time: 09/24/15  5:15 PM  Result Value Ref Range   Ammonia 159 (H) 9 - 35 umol/L  Lipase, blood  Status: Abnormal   Collection Time: 09/24/15  5:15 PM  Result Value Ref Range   Lipase 113 (H) 11 - 51 U/L  Troponin I     Status: Abnormal   Collection Time: 09/24/15  5:15 PM  Result Value Ref Range   Troponin I 0.11 (H) <0.031 ng/mL    Comment:        PERSISTENTLY INCREASED TROPONIN VALUES IN THE RANGE OF 0.04-0.49 ng/mL CAN BE SEEN IN:       -UNSTABLE ANGINA       -CONGESTIVE HEART FAILURE       -MYOCARDITIS       -CHEST TRAUMA       -ARRYHTHMIAS       -LATE PRESENTING MYOCARDIAL INFARCTION       -COPD   CLINICAL FOLLOW-UP RECOMMENDED.   Urinalysis, Routine w reflex microscopic (not at [facility])     Status: Abnormal   Collection Time: 09/24/15  5:20 PM  Result Value Ref Range   Color, Urine YELLOW YELLOW   APPearance HAZY (A) CLEAR   Specific Gravity, Urine 1.010 1.005 - 1.030   pH 5.5 5.0 - 8.0   Glucose, UA NEGATIVE NEGATIVE mg/dL   Hgb urine dipstick TRACE (A) NEGATIVE   Bilirubin  Urine NEGATIVE NEGATIVE   Ketones, ur NEGATIVE NEGATIVE mg/dL   Protein, ur NEGATIVE NEGATIVE mg/dL   Nitrite NEGATIVE NEGATIVE   Leukocytes, UA MODERATE (A) NEGATIVE  Urine microscopic-add on     Status: Abnormal   Collection Time: 09/24/15  5:20 PM  Result Value Ref Range   Squamous Epithelial / LPF 6-30 (A) NONE SEEN    Comment: Please note change in reference range.   WBC, UA TOO NUMEROUS TO COUNT 0 - 5 WBC/hpf    Comment: Please note change in reference range.   RBC / HPF 0-5 0 - 5 RBC/hpf    Comment: Please note change in reference range.   Bacteria, UA MANY (A) NONE SEEN    Comment: Please note change in reference range.  Blood gas, arterial ([facility] & [facility] ONLY)     Status: Abnormal   Collection Time: 09/24/15  5:30 PM  Result Value Ref Range   FIO2 ROOM AIR    pH, Arterial 7.399 7.350 - 7.450   pCO2 arterial 26.4 (L) 35.0 - 45.0 mmHg   pO2, Arterial 103 (H) 80.0 - 100.0 mmHg   Bicarbonate 18.5 (L) 20.0 - 24.0 mEq/L   TCO2 14.7 0 - 100 mmol/L   O2 Saturation 97.3 %   Collection site RIGHT RADIAL    Drawn by COLLECTED BY RT    Sample type ARTERIAL    Allens test (pass/fail) PASS PASS  Glucose, capillary     Status: None   Collection Time: 09/24/15  9:55 PM  Result Value Ref Range   Glucose-Capillary 91 65 - 99 mg/dL  Troponin I     Status: Abnormal   Collection Time: 09/24/15 10:24 PM  Result Value Ref Range   Troponin I 0.11 (H) <0.031 ng/mL    Comment:        PERSISTENTLY INCREASED TROPONIN VALUES IN THE RANGE OF 0.04-0.49 ng/mL CAN BE SEEN IN:       -UNSTABLE ANGINA       -CONGESTIVE HEART FAILURE       -MYOCARDITIS       -CHEST TRAUMA       -ARRYHTHMIAS       -LATE PRESENTING MYOCARDIAL INFARCTION       -COPD  CLINICAL FOLLOW-UP RECOMMENDED.   MRSA PCR Screening     Status: None   Collection Time: 09/24/15 10:30 PM  Result Value Ref Range   MRSA by PCR NEGATIVE NEGATIVE    Comment:        The GeneXpert MRSA Assay (FDA approved for NASAL specimens only),  is one component of a comprehensive MRSA colonization surveillance program. It is not intended to diagnose MRSA infection nor to guide or monitor treatment for MRSA infections.   Troponin I     Status: Abnormal   Collection Time: 09/25/15  4:20 AM  Result Value Ref Range   Troponin I 0.09 (H) <0.031 ng/mL    Comment:        PERSISTENTLY INCREASED TROPONIN VALUES IN THE RANGE OF 0.04-0.49 ng/mL CAN BE SEEN IN:       -UNSTABLE ANGINA       -CONGESTIVE HEART FAILURE       -MYOCARDITIS       -CHEST TRAUMA       -ARRYHTHMIAS       -LATE PRESENTING MYOCARDIAL INFARCTION       -COPD   CLINICAL FOLLOW-UP RECOMMENDED.   CBC     Status: Abnormal   Collection Time: 09/25/15  4:20 AM  Result Value Ref Range   WBC 5.8 4.0 - 10.5 K/uL   RBC 3.61 (L) 3.87 - 5.11 MIL/uL   Hemoglobin 9.3 (L) 12.0 - 15.0 g/dL   HCT 17.9 (L) 71.2 - 90.8 %   MCV 78.9 78.0 - 100.0 fL   MCH 25.8 (L) 26.0 - 34.0 pg   MCHC 32.6 30.0 - 36.0 g/dL   RDW 96.4 (H) 84.7 - 42.2 %   Platelets 151 150 - 400 K/uL  Basic metabolic panel     Status: Abnormal   Collection Time: 09/25/15  4:20 AM  Result Value Ref Range   Sodium 143 135 - 145 mmol/L   Potassium 5.1 3.5 - 5.1 mmol/L   Chloride 124 (H) 101 - 111 mmol/L   CO2 17 (L) 22 - 32 mmol/L   Glucose, Bld 79 65 - 99 mg/dL   BUN 23 (H) 6 - 20 mg/dL   Creatinine, Ser 9.46 (H) 0.44 - 1.00 mg/dL   Calcium 7.9 (L) 8.9 - 10.3 mg/dL   GFR calc non Af Amer 54 (L) >60 mL/min   GFR calc Af Amer >60 >60 mL/min    Comment: (NOTE) The eGFR has been calculated using the CKD EPI equation. This calculation has not been validated in all clinical situations. eGFR's persistently <60 mL/min signify possible Chronic Kidney Disease.    Anion gap 2 (L) 5 - 15  Ammonia     Status: Abnormal   Collection Time: 09/25/15  4:20 AM  Result Value Ref Range   Ammonia 48 (H) 9 - 35 umol/L  Troponin I     Status: Abnormal   Collection Time: 09/25/15  9:12 AM  Result Value Ref Range    Troponin I 0.11 (H) <0.031 ng/mL    Comment:        PERSISTENTLY INCREASED TROPONIN VALUES IN THE RANGE OF 0.04-0.49 ng/mL CAN BE SEEN IN:       -UNSTABLE ANGINA       -CONGESTIVE HEART FAILURE       -MYOCARDITIS       -CHEST TRAUMA       -ARRYHTHMIAS       -LATE PRESENTING MYOCARDIAL INFARCTION       -COPD  CLINICAL FOLLOW-UP RECOMMENDED.     ABGS  Recent Labs  09/24/15 1730  PHART 7.399  PO2ART 103*  TCO2 14.7  HCO3 18.5*   CULTURES Recent Results (from the past 240 hour(s))  MRSA PCR Screening     Status: None   Collection Time: 09/24/15 10:30 PM  Result Value Ref Range Status   MRSA by PCR NEGATIVE NEGATIVE Final    Comment:        The GeneXpert MRSA Assay (FDA approved for NASAL specimens only), is one component of a comprehensive MRSA colonization surveillance program. It is not intended to diagnose MRSA infection nor to guide or monitor treatment for MRSA infections.    Studies/Results: Ct Head Wo Contrast  09/24/2015  CLINICAL DATA:  Altered mental status today. EXAM: CT HEAD WITHOUT CONTRAST TECHNIQUE: Contiguous axial images were obtained from the base of the skull through the vertex without intravenous contrast. COMPARISON:  Head CT 08/30/2015 FINDINGS: Motion throughout exam as multiple scan attempts. 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 Ventricles, cisterns and other CSF spaces are within normal. 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 There is no mass, mass effect, shift of midline structures or acute hemorrhage. No evidence of acute infarction. Remainder the exam is unchanged. IMPRESSION: No acute intracranial findings. 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 Postsurgical change compatible with  previous aneurysm clipping over the right side of the circle of Willis. Electronically Signed   By: Marin Olp M.D.   On: 09/24/2015 18:03   Dg Chest Portable 1 View  09/24/2015  CLINICAL DATA:  Altered mental status today. EXAM: PORTABLE CHEST 1 VIEW COMPARISON:  08/03/2015 FINDINGS: Lungs are adequately inflated without consolidation. Apparent resolution of previously noted right pleural effusion. Mild cardiomegaly. Remainder of the exam is unchanged. IMPRESSION: No active disease. Electronically Signed   By: Marin Olp M.D.   On: 09/24/2015 18:06    Medications: I have reviewed the patient's current medications.  Assesment:   Active Problems:   Hepatic encephalopathy (H[facility])   UTI (lower urinary tract infection)   Delirium due to another medical condition   Acute renal injury ([place])   Elevated troponin    Plan:  Medications reviewed Will continue lactulose Continue iv antibiotics Will continue iv fluid Will monitor CBC and BMp    LOS: 1 day   Jameia Makris 09/25/2015, 12:41 PM

## 2015-09-25 NOTE — Progress Notes (Signed)
Patient more awake talking with visitor at bedside. Follows command.

## 2015-09-26 LAB — CBC
HCT: 29 % — ABNORMAL LOW (ref 36.0–46.0)
HEMOGLOBIN: 9.4 g/dL — AB (ref 12.0–15.0)
MCH: 26.1 pg (ref 26.0–34.0)
MCHC: 32.4 g/dL (ref 30.0–36.0)
MCV: 80.6 fL (ref 78.0–100.0)
Platelets: 157 10*3/uL (ref 150–400)
RBC: 3.6 MIL/uL — AB (ref 3.87–5.11)
RDW: 20.2 % — ABNORMAL HIGH (ref 11.5–15.5)
WBC: 6.5 10*3/uL (ref 4.0–10.5)

## 2015-09-26 LAB — BASIC METABOLIC PANEL
Anion gap: 3 — ABNORMAL LOW (ref 5–15)
BUN: 21 mg/dL — AB (ref 6–20)
CHLORIDE: 123 mmol/L — AB (ref 101–111)
CO2: 13 mmol/L — AB (ref 22–32)
Calcium: 7.5 mg/dL — ABNORMAL LOW (ref 8.9–10.3)
Creatinine, Ser: 0.98 mg/dL (ref 0.44–1.00)
GFR calc Af Amer: >60 mL/min (ref >60)
GFR calc non Af Amer: >60 mL/min (ref >60)
GLUCOSE: 112 mg/dL — AB (ref 65–99)
POTASSIUM: 4.1 mmol/L (ref 3.5–5.1)
Sodium: 139 mmol/L (ref 135–145)

## 2015-09-26 LAB — AMMONIA: Ammonia: 80 umol/L — ABNORMAL HIGH (ref 9–35)

## 2015-09-26 MED ORDER — FOLIC ACID 1 MG PO TABS
1.0000 mg | ORAL_TABLET | Freq: Every day | ORAL | Status: DC
  Administered 2015-09-26 – 2015-09-28 (×3): 1 mg via ORAL
  Filled 2015-09-26 (×3): qty 1

## 2015-09-26 MED ORDER — VITAMIN B-1 100 MG PO TABS
100.0000 mg | ORAL_TABLET | Freq: Every day | ORAL | Status: DC
  Administered 2015-09-26 – 2015-09-28 (×3): 100 mg via ORAL
  Filled 2015-09-26 (×3): qty 1

## 2015-09-26 MED ORDER — NADOLOL 20 MG PO TABS
20.0000 mg | ORAL_TABLET | Freq: Every day | ORAL | Status: DC
  Administered 2015-09-26 – 2015-09-28 (×3): 20 mg via ORAL
  Filled 2015-09-26 (×4): qty 1

## 2015-09-26 MED ORDER — FUROSEMIDE 20 MG PO TABS
20.0000 mg | ORAL_TABLET | Freq: Every day | ORAL | Status: DC
  Administered 2015-09-27 – 2015-09-28 (×2): 20 mg via ORAL
  Filled 2015-09-26 (×2): qty 1

## 2015-09-26 MED ORDER — SPIRONOLACTONE 25 MG PO TABS
100.0000 mg | ORAL_TABLET | Freq: Every day | ORAL | Status: DC
  Administered 2015-09-26 – 2015-09-28 (×3): 100 mg via ORAL
  Filled 2015-09-26: qty 4
  Filled 2015-09-26 (×2): qty 1

## 2015-09-26 MED ORDER — RIFAXIMIN 550 MG PO TABS
550.0000 mg | ORAL_TABLET | Freq: Two times a day (BID) | ORAL | Status: DC
  Administered 2015-09-26 – 2015-09-28 (×5): 550 mg via ORAL
  Filled 2015-09-26 (×5): qty 1

## 2015-09-26 NOTE — Progress Notes (Signed)
Subjective: Patient feels better. She is responding appropriately. She is tolerating soft diet. No new complaqint.. Objective: Vital signs in last 24 hours: Temp:  [96.9 F (36.1 C)-98.5 F (36.9 C)] 98.5 F (36.9 C) (11/27 0400) Pulse Rate:  [52-88] 61 (11/27 0700) Resp:  [14-21] 21 (11/27 0700) BP: (85-130)/(50-118) 115/87 mmHg (11/27 0700) SpO2:  [99 %-100 %] 99 % (11/27 0700) FiO2 (%):  [28 %] 28 % (11/26 2100) Weight:  [65.7 kg (144 lb 13.5 oz)] 65.7 kg (144 lb 13.5 oz) (11/27 0500) Weight change: -0.979 kg (-2 lb 2.5 oz) Last BM Date: 09/26/15  Intake/Output from previous day: 11/26 0701 - 11/27 0700 In: 1610 [P.O.:1020; I.V.:575] Out: 2625 [Urine:1025; Stool:1600]  PHYSICAL EXAM General appearance: cachectic and slowed mentation Resp: diminished breath sounds bilaterally and rhonchi bilaterally Cardio: S1, S2 normal GI: soft and lax, bowel sound is postive Extremities: extremities normal, atraumatic, no cyanosis or edema  Lab Results:  Results for orders placed or performed during the hospital encounter of 09/24/15 (from the past 48 hour(s))  CBC with Differential/Platelet     Status: Abnormal   Collection Time: 09/24/15  4:10 PM  Result Value Ref Range   WBC 7.0 4.0 - 10.5 K/uL   RBC 3.96 3.87 - 5.11 MIL/uL   Hemoglobin 10.4 (L) 12.0 - 15.0 g/dL   HCT 31.3 (L) 36.0 - 46.0 %   MCV 79.0 78.0 - 100.0 fL   MCH 26.3 26.0 - 34.0 pg   MCHC 33.2 30.0 - 36.0 g/dL   RDW 20.4 (H) 11.5 - 15.5 %   Platelets 166 150 - 400 K/uL  Protime-INR     Status: Abnormal   Collection Time: 09/24/15  4:10 PM  Result Value Ref Range   Prothrombin Time 16.6 (H) 11.6 - 15.2 seconds   INR 1.33 0.00 - 1.49  Comprehensive metabolic panel     Status: Abnormal   Collection Time: 09/24/15  5:15 PM  Result Value Ref Range   Sodium 139 135 - 145 mmol/L   Potassium 4.9 3.5 - 5.1 mmol/L   Chloride 114 (H) 101 - 111 mmol/L   CO2 16 (L) 22 - 32 mmol/L   Glucose, Bld 103 (H) 65 - 99 mg/dL    BUN 26 (H) 6 - 20 mg/dL   Creatinine, Ser 1.44 (H) 0.44 - 1.00 mg/dL   Calcium 8.4 (L) 8.9 - 10.3 mg/dL   Total Protein 5.7 (L) 6.5 - 8.1 g/dL   Albumin 1.9 (L) 3.5 - 5.0 g/dL   AST 78 (H) 15 - 41 U/L   ALT 32 14 - 54 U/L   Alkaline Phosphatase 211 (H) 38 - 126 U/L   Total Bilirubin 1.8 (H) 0.3 - 1.2 mg/dL   GFR calc non Af Amer 39 (L) >60 mL/min   GFR calc Af Amer 45 (L) >60 mL/min    Comment: (NOTE) The eGFR has been calculated using the CKD EPI equation. This calculation has not been validated in all clinical situations. eGFR's persistently <60 mL/min signify possible Chronic Kidney Disease.    Anion gap 9 5 - 15  Ammonia     Status: Abnormal   Collection Time: 09/24/15  5:15 PM  Result Value Ref Range   Ammonia 159 (H) 9 - 35 umol/L  Lipase, blood     Status: Abnormal   Collection Time: 09/24/15  5:15 PM  Result Value Ref Range   Lipase 113 (H) 11 - 51 U/L  Troponin I     Status:  Abnormal   Collection Time: 09/24/15  5:15 PM  Result Value Ref Range   Troponin I 0.11 (H) <0.031 ng/mL    Comment:        PERSISTENTLY INCREASED TROPONIN VALUES IN THE RANGE OF 0.04-0.49 ng/mL CAN BE SEEN IN:       -UNSTABLE ANGINA       -CONGESTIVE HEART FAILURE       -MYOCARDITIS       -CHEST TRAUMA       -ARRYHTHMIAS       -LATE PRESENTING MYOCARDIAL INFARCTION       -COPD   CLINICAL FOLLOW-UP RECOMMENDED.   Urinalysis, Routine w reflex microscopic (not at [facility])     Status: Abnormal   Collection Time: 09/24/15  5:20 PM  Result Value Ref Range   Color, Urine YELLOW YELLOW   APPearance HAZY (A) CLEAR   Specific Gravity, Urine 1.010 1.005 - 1.030   pH 5.5 5.0 - 8.0   Glucose, UA NEGATIVE NEGATIVE mg/dL   Hgb urine dipstick TRACE (A) NEGATIVE   Bilirubin Urine NEGATIVE NEGATIVE   Ketones, ur NEGATIVE NEGATIVE mg/dL   Protein, ur NEGATIVE NEGATIVE mg/dL   Nitrite NEGATIVE NEGATIVE   Leukocytes, UA MODERATE (A) NEGATIVE  Urine microscopic-add on     Status: Abnormal   Collection  Time: 09/24/15  5:20 PM  Result Value Ref Range   Squamous Epithelial / LPF 6-30 (A) NONE SEEN    Comment: Please note change in reference range.   WBC, UA TOO NUMEROUS TO COUNT 0 - 5 WBC/hpf    Comment: Please note change in reference range.   RBC / HPF 0-5 0 - 5 RBC/hpf    Comment: Please note change in reference range.   Bacteria, UA MANY (A) NONE SEEN    Comment: Please note change in reference range.  Blood gas, arterial ([facility] ONLY)     Status: Abnormal   Collection Time: 09/24/15  5:30 PM  Result Value Ref Range   FIO2 ROOM AIR    pH, Arterial 7.399 7.350 - 7.450   pCO2 arterial 26.4 (L) 35.0 - 45.0 mmHg   pO2, Arterial 103 (H) 80.0 - 100.0 mmHg   Bicarbonate 18.5 (L) 20.0 - 24.0 mEq/L   TCO2 14.7 0 - 100 mmol/L   O2 Saturation 97.3 %   Collection site RIGHT RADIAL    Drawn by COLLECTED BY RT    Sample type ARTERIAL    Allens test (pass/fail) PASS PASS  Glucose, capillary     Status: None   Collection Time: 09/24/15  9:55 PM  Result Value Ref Range   Glucose-Capillary 91 65 - 99 mg/dL  Troponin I     Status: Abnormal   Collection Time: 09/24/15 10:24 PM  Result Value Ref Range   Troponin I 0.11 (H) <0.031 ng/mL    Comment:        PERSISTENTLY INCREASED TROPONIN VALUES IN THE RANGE OF 0.04-0.49 ng/mL CAN BE SEEN IN:       -UNSTABLE ANGINA       -CONGESTIVE HEART FAILURE       -MYOCARDITIS       -CHEST TRAUMA       -ARRYHTHMIAS       -LATE PRESENTING MYOCARDIAL INFARCTION       -COPD   CLINICAL FOLLOW-UP RECOMMENDED.   MRSA PCR Screening     Status: None   Collection Time: 09/24/15 10:30 PM  Result Value Ref Range   MRSA by  PCR NEGATIVE NEGATIVE    Comment:        The GeneXpert MRSA Assay (FDA approved for NASAL specimens only), is one component of a comprehensive MRSA colonization surveillance program. It is not intended to diagnose MRSA infection nor to guide or monitor treatment for MRSA infections.   Troponin I     Status: Abnormal   Collection  Time: 09/25/15  4:20 AM  Result Value Ref Range   Troponin I 0.09 (H) <0.031 ng/mL    Comment:        PERSISTENTLY INCREASED TROPONIN VALUES IN THE RANGE OF 0.04-0.49 ng/mL CAN BE SEEN IN:       -UNSTABLE ANGINA       -CONGESTIVE HEART FAILURE       -MYOCARDITIS       -CHEST TRAUMA       -ARRYHTHMIAS       -LATE PRESENTING MYOCARDIAL INFARCTION       -COPD   CLINICAL FOLLOW-UP RECOMMENDED.   CBC     Status: Abnormal   Collection Time: 09/25/15  4:20 AM  Result Value Ref Range   WBC 5.8 4.0 - 10.5 K/uL   RBC 3.61 (L) 3.87 - 5.11 MIL/uL   Hemoglobin 9.3 (L) 12.0 - 15.0 g/dL   HCT 28.5 (L) 36.0 - 46.0 %   MCV 78.9 78.0 - 100.0 fL   MCH 25.8 (L) 26.0 - 34.0 pg   MCHC 32.6 30.0 - 36.0 g/dL   RDW 20.5 (H) 11.5 - 15.5 %   Platelets 151 150 - 400 K/uL  Basic metabolic panel     Status: Abnormal   Collection Time: 09/25/15  4:20 AM  Result Value Ref Range   Sodium 143 135 - 145 mmol/L   Potassium 5.1 3.5 - 5.1 mmol/L   Chloride 124 (H) 101 - 111 mmol/L   CO2 17 (L) 22 - 32 mmol/L   Glucose, Bld 79 65 - 99 mg/dL   BUN 23 (H) 6 - 20 mg/dL   Creatinine, Ser 1.09 (H) 0.44 - 1.00 mg/dL   Calcium 7.9 (L) 8.9 - 10.3 mg/dL   GFR calc non Af Amer 54 (L) >60 mL/min   GFR calc Af Amer >60 >60 mL/min    Comment: (NOTE) The eGFR has been calculated using the CKD EPI equation. This calculation has not been validated in all clinical situations. eGFR's persistently <60 mL/min signify possible Chronic Kidney Disease.    Anion gap 2 (L) 5 - 15  Ammonia     Status: Abnormal   Collection Time: 09/25/15  4:20 AM  Result Value Ref Range   Ammonia 48 (H) 9 - 35 umol/L  Troponin I     Status: Abnormal   Collection Time: 09/25/15  9:12 AM  Result Value Ref Range   Troponin I 0.11 (H) <0.031 ng/mL    Comment:        PERSISTENTLY INCREASED TROPONIN VALUES IN THE RANGE OF 0.04-0.49 ng/mL CAN BE SEEN IN:       -UNSTABLE ANGINA       -CONGESTIVE HEART FAILURE       -MYOCARDITIS       -CHEST  TRAUMA       -ARRYHTHMIAS       -LATE PRESENTING MYOCARDIAL INFARCTION       -COPD   CLINICAL FOLLOW-UP RECOMMENDED.   CBC     Status: Abnormal   Collection Time: 09/26/15  5:01 AM  Result Value Ref Range   WBC 6.5 4.0 -  10.5 K/uL   RBC 3.60 (L) 3.87 - 5.11 MIL/uL   Hemoglobin 9.4 (L) 12.0 - 15.0 g/dL   HCT 29.0 (L) 36.0 - 46.0 %   MCV 80.6 78.0 - 100.0 fL   MCH 26.1 26.0 - 34.0 pg   MCHC 32.4 30.0 - 36.0 g/dL   RDW 20.2 (H) 11.5 - 15.5 %   Platelets 157 150 - 400 K/uL  Basic metabolic panel     Status: Abnormal   Collection Time: 09/26/15  5:01 AM  Result Value Ref Range   Sodium 139 135 - 145 mmol/L   Potassium 4.1 3.5 - 5.1 mmol/L    Comment: DELTA CHECK NOTED   Chloride 123 (H) 101 - 111 mmol/L   CO2 13 (L) 22 - 32 mmol/L   Glucose, Bld 112 (H) 65 - 99 mg/dL   BUN 21 (H) 6 - 20 mg/dL   Creatinine, Ser 0.98 0.44 - 1.00 mg/dL   Calcium 7.5 (L) 8.9 - 10.3 mg/dL   GFR calc non Af Amer >60 >60 mL/min   GFR calc Af Amer >60 >60 mL/min    Comment: (NOTE) The eGFR has been calculated using the CKD EPI equation. This calculation has not been validated in all clinical situations. eGFR's persistently <60 mL/min signify possible Chronic Kidney Disease.    Anion gap 3 (L) 5 - 15  Ammonia     Status: Abnormal   Collection Time: 09/26/15  5:01 AM  Result Value Ref Range   Ammonia 80 (H) 9 - 35 umol/L    ABGS  Recent Labs  09/24/15 1730  PHART 7.399  PO2ART 103*  TCO2 14.7  HCO3 18.5*   CULTURES Recent Results (from the past 240 hour(s))  MRSA PCR Screening     Status: None   Collection Time: 09/24/15 10:30 PM  Result Value Ref Range Status   MRSA by PCR NEGATIVE NEGATIVE Final    Comment:        The GeneXpert MRSA Assay (FDA approved for NASAL specimens only), is one component of a comprehensive MRSA colonization surveillance program. It is not intended to diagnose MRSA infection nor to guide or monitor treatment for MRSA infections.     Studies/Results: Ct Head Wo Contrast  09/24/2015  CLINICAL DATA:  Altered mental status today. EXAM: CT HEAD WITHOUT CONTRAST TECHNIQUE: Contiguous axial images were obtained from the base of the skull through the vertex without intravenous contrast. COMPARISON:  Head CT 08/30/2015 FINDINGS: Motion throughout exam as multiple scan attempts. 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 Ventricles, cisterns and other CSF spaces are within normal. 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 There is no mass, mass effect, shift of midline structures or acute hemorrhage. No evidence of acute infarction. Remainder the exam is unchanged. IMPRESSION: No acute intracranial findings. 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 Electronically Signed   By: Marin Olp M.D.   On: 09/24/2015 18:03   Dg Chest Portable 1 View  09/24/2015  CLINICAL DATA:  Altered mental status today. EXAM: PORTABLE CHEST 1 VIEW COMPARISON:  08/03/2015 FINDINGS: Lungs are adequately inflated without consolidation. Apparent resolution of previously noted right pleural effusion. Mild cardiomegaly. Remainder of the exam is unchanged. IMPRESSION: No active disease. Electronically Signed   By: Quillian Quince  Derrel Nip M.D.   On: 09/24/2015 18:06    Medications: I have reviewed the patient's current medications.  Assesment:   Active Problems:   Hepatic encephalopathy (HCC)   UTI (lower urinary tract infection)   Delirium due to another medical condition   Acute renal injury ([place])   Elevated troponin    Plan:  Medications reviewed Will continue lactulose Continue iv antibiotics Will continue  home medications. Will monitor CB/BMP/ammonia level Will follow urine culture result    LOS: 2 days   Savannah Jackson 09/26/2015, 9:05 AM

## 2015-09-27 LAB — CBC
HEMATOCRIT: 27.8 % — AB (ref 36.0–46.0)
Hemoglobin: 9.5 g/dL — ABNORMAL LOW (ref 12.0–15.0)
MCH: 26.8 pg (ref 26.0–34.0)
MCHC: 34.2 g/dL (ref 30.0–36.0)
MCV: 78.3 fL (ref 78.0–100.0)
Platelets: 147 10*3/uL — ABNORMAL LOW (ref 150–400)
RBC: 3.55 MIL/uL — ABNORMAL LOW (ref 3.87–5.11)
RDW: 19.9 % — AB (ref 11.5–15.5)
WBC: 8.2 10*3/uL (ref 4.0–10.5)

## 2015-09-27 LAB — URINE CULTURE

## 2015-09-27 LAB — BASIC METABOLIC PANEL
Anion gap: 2 — ABNORMAL LOW (ref 5–15)
BUN: 18 mg/dL (ref 6–20)
CALCIUM: 7.4 mg/dL — AB (ref 8.9–10.3)
CO2: 18 mmol/L — AB (ref 22–32)
CREATININE: 0.87 mg/dL (ref 0.44–1.00)
Chloride: 117 mmol/L — ABNORMAL HIGH (ref 101–111)
GFR calc Af Amer: >60 mL/min (ref >60)
GFR calc non Af Amer: >60 mL/min (ref >60)
GLUCOSE: 110 mg/dL — AB (ref 65–99)
Potassium: 4.2 mmol/L (ref 3.5–5.1)
Sodium: 137 mmol/L (ref 135–145)

## 2015-09-27 LAB — AMMONIA: Ammonia: 71 umol/L — ABNORMAL HIGH (ref 9–35)

## 2015-09-27 MED ORDER — PANTOPRAZOLE SODIUM 40 MG PO TBEC
40.0000 mg | DELAYED_RELEASE_TABLET | Freq: Every day | ORAL | Status: DC
  Administered 2015-09-27 – 2015-09-28 (×2): 40 mg via ORAL
  Filled 2015-09-27 (×2): qty 1

## 2015-09-27 NOTE — Progress Notes (Signed)
Report called to M. Marcell Anger, Therapist, sports. Patient transferred to 314 in stable condition via wheelchair with NT.

## 2015-09-27 NOTE — Progress Notes (Signed)
Subjective: Patient is improving. Mentally more clear. She is tolerating her diet. Her ammonia level remained high.. Objective: Vital signs in last 24 hours: Temp:  [97 F (36.1 C)-98.5 F (36.9 C)] 97.5 F (36.4 C) (11/28 0400) Pulse Rate:  [51-120] 65 (11/28 0600) Resp:  [13-24] 13 (11/28 0600) BP: (94-137)/(71-122) 115/78 mmHg (11/28 0600) SpO2:  [92 %-100 %] 100 % (11/28 0600) FiO2 (%):  [28 %] 28 % (11/27 2315) Weight:  [67.1 kg (147 lb 14.9 oz)] 67.1 kg (147 lb 14.9 oz) (11/28 0500) Weight change: 1.4 kg (3 lb 1.4 oz) Last BM Date: 09/26/15  Intake/Output from previous day: 11/27 0701 - 11/28 0700 In: 1180 [P.O.:1180] Out: 1700 [Urine:1100; Stool:600]  PHYSICAL EXAM General appearance: cachectic and slowed mentation Resp: diminished breath sounds bilaterally and rhonchi bilaterally Cardio: S1, S2 normal GI: soft and lax, bowel sound is postive Extremities: extremities normal, atraumatic, no cyanosis or edema  Lab Results:  Results for orders placed or performed during the hospital encounter of 09/24/15 (from the past 48 hour(s))  Troponin I     Status: Abnormal   Collection Time: 09/25/15  9:12 AM  Result Value Ref Range   Troponin I 0.11 (H) <0.031 ng/mL    Comment:        PERSISTENTLY INCREASED TROPONIN VALUES IN THE RANGE OF 0.04-0.49 ng/mL CAN BE SEEN IN:       -UNSTABLE ANGINA       -CONGESTIVE HEART FAILURE       -MYOCARDITIS       -CHEST TRAUMA       -ARRYHTHMIAS       -LATE PRESENTING MYOCARDIAL INFARCTION       -COPD   CLINICAL FOLLOW-UP RECOMMENDED.   CBC     Status: Abnormal   Collection Time: 09/26/15  5:01 AM  Result Value Ref Range   WBC 6.5 4.0 - 10.5 K/uL   RBC 3.60 (L) 3.87 - 5.11 MIL/uL   Hemoglobin 9.4 (L) 12.0 - 15.0 g/dL   HCT 29.0 (L) 36.0 - 46.0 %   MCV 80.6 78.0 - 100.0 fL   MCH 26.1 26.0 - 34.0 pg   MCHC 32.4 30.0 - 36.0 g/dL   RDW 20.2 (H) 11.5 - 15.5 %   Platelets 157 150 - 400 K/uL  Basic metabolic panel     Status:  Abnormal   Collection Time: 09/26/15  5:01 AM  Result Value Ref Range   Sodium 139 135 - 145 mmol/L   Potassium 4.1 3.5 - 5.1 mmol/L    Comment: DELTA CHECK NOTED   Chloride 123 (H) 101 - 111 mmol/L   CO2 13 (L) 22 - 32 mmol/L   Glucose, Bld 112 (H) 65 - 99 mg/dL   BUN 21 (H) 6 - 20 mg/dL   Creatinine, Ser 0.98 0.44 - 1.00 mg/dL   Calcium 7.5 (L) 8.9 - 10.3 mg/dL   GFR calc non Af Amer >60 >60 mL/min   GFR calc Af Amer >60 >60 mL/min    Comment: (NOTE) The eGFR has been calculated using the CKD EPI equation. This calculation has not been validated in all clinical situations. eGFR's persistently <60 mL/min signify possible Chronic Kidney Disease.    Anion gap 3 (L) 5 - 15  Ammonia     Status: Abnormal   Collection Time: 09/26/15  5:01 AM  Result Value Ref Range   Ammonia 80 (H) 9 - 35 umol/L  CBC     Status: Abnormal   Collection Time:  09/27/15  1:54 AM  Result Value Ref Range   WBC 8.2 4.0 - 10.5 K/uL   RBC 3.55 (L) 3.87 - 5.11 MIL/uL   Hemoglobin 9.5 (L) 12.0 - 15.0 g/dL   HCT 27.8 (L) 36.0 - 46.0 %   MCV 78.3 78.0 - 100.0 fL   MCH 26.8 26.0 - 34.0 pg   MCHC 34.2 30.0 - 36.0 g/dL   RDW 19.9 (H) 11.5 - 15.5 %   Platelets 147 (L) 150 - 400 K/uL  Basic metabolic panel     Status: Abnormal   Collection Time: 09/27/15  1:54 AM  Result Value Ref Range   Sodium 137 135 - 145 mmol/L   Potassium 4.2 3.5 - 5.1 mmol/L   Chloride 117 (H) 101 - 111 mmol/L   CO2 18 (L) 22 - 32 mmol/L   Glucose, Bld 110 (H) 65 - 99 mg/dL   BUN 18 6 - 20 mg/dL   Creatinine, Ser 0.87 0.44 - 1.00 mg/dL   Calcium 7.4 (L) 8.9 - 10.3 mg/dL   GFR calc non Af Amer >60 >60 mL/min   GFR calc Af Amer >60 >60 mL/min    Comment: (NOTE) The eGFR has been calculated using the CKD EPI equation. This calculation has not been validated in all clinical situations. eGFR's persistently <60 mL/min signify possible Chronic Kidney Disease.    Anion gap 2 (L) 5 - 15  Ammonia     Status: Abnormal   Collection Time:  09/27/15  1:54 AM  Result Value Ref Range   Ammonia 71 (H) 9 - 35 umol/L    ABGS  Recent Labs  09/24/15 1730  PHART 7.399  PO2ART 103*  TCO2 14.7  HCO3 18.5*   CULTURES Recent Results (from the past 240 hour(s))  Urine culture     Status: None (Preliminary result)   Collection Time: 09/24/15  6:22 PM  Result Value Ref Range Status   Specimen Description URINE, CLEAN CATCH  Final   Special Requests NONE  Final   Culture   Final    TOO YOUNG TO READ Performed at [facility]    Report Status PENDING  Incomplete  MRSA PCR Screening     Status: None   Collection Time: 09/24/15 10:30 PM  Result Value Ref Range Status   MRSA by PCR NEGATIVE NEGATIVE Final    Comment:        The GeneXpert MRSA Assay (FDA approved for NASAL specimens only), is one component of a comprehensive MRSA colonization surveillance program. It is not intended to diagnose MRSA infection nor to guide or monitor treatment for MRSA infections.    Studies/Results: No results found.  Medications: I have reviewed the patient's current medications.  Assesment:   Active Problems:   Hepatic encephalopathy (HCC)   UTI (lower urinary tract infection)   Delirium due to another medical condition   Acute renal injury ([place])   Elevated troponin    Plan:  Medications reviewed Will continue lactulose Continue iv antibiotics Will continue home medications.  Will monitor CB/BMP/ammonia level     LOS: 3 days   Shian Goodnow 09/27/2015, 7:56 AM

## 2015-09-28 MED ORDER — CIPROFLOXACIN HCL 500 MG PO TABS: 500.0000 mg | ORAL_TABLET | Freq: Two times a day (BID) | ORAL | Status: DC

## 2015-09-28 MED ORDER — LACTULOSE 10 GM/15ML PO SOLN: 20.0000 g | Freq: Three times a day (TID) | ORAL | Status: DC

## 2015-09-28 NOTE — Discharge Summary (Signed)
Physician Discharge Summary  Patient ID: Savannah Jackson MRN: [id number] DOB/AGE: 1956-03-25 59 y.o. Primary Care Physician:Daryon Remmert, MD Admit date: 09/24/2015 Discharge date: 09/28/2015    Discharge Diagnoses:   Active Problems:   Hepatic encephalopathy (HCC)   UTI (lower urinary tract infection)   Delirium due to another medical condition   Acute renal injury ([place])   Elevated troponin     Medication List    STOP taking these medications        oxyCODONE 5 MG immediate release tablet  Commonly known as:  Oxy IR/ROXICODONE      TAKE these medications        ciprofloxacin 500 MG tablet  Commonly known as:  CIPRO  Take 1 tablet (500 mg total) by mouth 2 (two) times daily.     folic acid 1 MG tablet  Commonly known as:  FOLVITE  Take 1 tablet (1 mg total) by mouth daily.     furosemide 40 MG tablet  Commonly known as:  LASIX  Take 20 mg by mouth daily.     lactulose 10 GM/15ML solution  Commonly known as:  CHRONULAC  Take 30 mLs (20 g total) by mouth 3 (three) times daily. Four times a day     nadolol 20 MG tablet  Commonly known as:  CORGARD  Take 0.5 tablets (10 mg total) by mouth daily.     ondansetron 4 MG tablet  Commonly known as:  ZOFRAN  Take 1 tablet (4 mg total) by mouth every 8 (eight) hours as needed for nausea or vomiting.     rifaximin 550 MG Tabs tablet  Commonly known as:  XIFAXAN  Take 550 mg by mouth 2 (two) times daily.     spironolactone 100 MG tablet  Commonly known as:  ALDACTONE  Take 1 tablet (100 mg total) by mouth daily.     thiamine 100 MG tablet  Take 1 tablet (100 mg total) by mouth daily.        Discharged Condition: improved    Consults: none  Significant Diagnostic Studies: Ct Head Wo Contrast  09/24/2015  CLINICAL DATA:  Altered mental status today. EXAM: CT HEAD WITHOUT CONTRAST TECHNIQUE: Contiguous axial images were obtained from the base of the skull through the vertex without intravenous contrast.  COMPARISON:  Head CT 08/30/2015 FINDINGS: Motion throughout exam as multiple scan attempts. 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 Ventricles, cisterns and other CSF spaces are within normal. 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 There is no mass, mass effect, shift of midline structures or acute hemorrhage. No evidence of acute infarction. Remainder the exam is unchanged. IMPRESSION: No acute intracranial findings. 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 Electronically Signed   By: Marin Olp M.D.   On: 09/24/2015 18:03   Ct Head Wo Contrast  08/30/2015  CLINICAL DATA:  Altered mental status. Reported history of a surgery for brain aneurysm. EXAM: CT HEAD WITHOUT CONTRAST TECHNIQUE: Contiguous axial images were obtained from the base of the skull through the vertex without intravenous contrast. COMPARISON:  None. FINDINGS: Postsurgical changes are noted from prior right frontal craniotomy. Aneurysm clips are noted in the anterior right basilar cistern just to the right of midline. No evidence of parenchymal hemorrhage or extra-axial  fluid collection. No mass lesion, mass effect, or midline shift. No CT evidence of acute infarction. There is somewhat prominent symmetric periventricular white matter hypodensity. Cerebral volume is age appropriate. No ventriculomegaly. The visualized paranasal sinuses are essentially clear. The mastoid air cells are unopacified. No evidence of calvarial fracture. IMPRESSION: 1. No acute intracranial hemorrhage. 2. Nonspecific prominent periventricular white matter hypodensity, possibly due to chronic small vessel  ischemia, although other etiologies such as demyelination should be considered as these changes are prominent for age. Correlate with brain MRI as clinically warranted. 3. Postsurgical changes from prior right frontal craniotomy with aneurysm clips in the anterior right basilar cistern. Electronically Signed   By: Ilona Sorrel M.D.   On: 08/30/2015 15:10   Dg Chest Portable 1 View  09/24/2015  CLINICAL DATA:  Altered mental status today. EXAM: PORTABLE CHEST 1 VIEW COMPARISON:  08/03/2015 FINDINGS: Lungs are adequately inflated without consolidation. Apparent resolution of previously noted right pleural effusion. Mild cardiomegaly. Remainder of the exam is unchanged. IMPRESSION: No active disease. Electronically Signed   By: Marin Olp M.D.   On: 09/24/2015 18:06    Lab Results: Basic Metabolic Panel:  Recent Labs  09/26/15 0501 09/27/15 0154  NA 139 137  K 4.1 4.2  CL 123* 117*  CO2 13* 18*  GLUCOSE 112* 110*  BUN 21* 18  CREATININE 0.98 0.87  CALCIUM 7.5* 7.4*   Liver Function Tests: No results for input(s): AST, ALT, ALKPHOS, BILITOT, PROT, ALBUMIN in the last 72 hours.   CBC:  Recent Labs  09/26/15 0501 09/27/15 0154  WBC 6.5 8.2  HGB 9.4* 9.5*  HCT 29.0* 27.8*  MCV 80.6 78.3  PLT 157 147*    Recent Results (from the past 240 hour(s))  Urine culture     Status: None   Collection Time: 09/24/15  6:22 PM  Result Value Ref Range Status   Specimen Description URINE, CLEAN CATCH  Final   Special Requests NONE  Final   Culture   Final    MULTIPLE SPECIES PRESENT, SUGGEST RECOLLECTION Performed at [facility]    Report Status 09/27/2015 FINAL  Final  MRSA PCR Screening     Status: None   Collection Time: 09/24/15 10:30 PM  Result Value Ref Range Status   MRSA by PCR NEGATIVE NEGATIVE Final    Comment:        The GeneXpert MRSA Assay (FDA approved for NASAL specimens only), is one component of a comprehensive MRSA colonization surveillance program.  It is not intended to diagnose MRSA infection nor to guide or monitor treatment for MRSA infections.      Hospital Course:   This is a 60 years old female with history of multiple medical illnesses was admitted due to confusion and change in mental status as case hepatic encephalopathy. Patient was also found to have UTI and dehydration. She was treated with lactulose and IV antibiotics. Patient improved over the hospital stay. She is back to her regular health status. She will be discharged with oral antibiotics.  Discharge Exam: Blood pressure 92/62, pulse 62, temperature 97.6 F (36.4 C), temperature source Oral, resp. rate 20, height 5\' 3"  (1.6 m), weight 66.5 kg (146 lb 9.7 oz), SpO2 99 %.    Disposition:  home      Signed: Shakeeta Godette   09/28/2015, 8:19 AM

## 2015-09-28 NOTE — Care Management Important Message (Signed)
Important Message  Patient Details  Name: Savannah Jackson MRN: [id number] Date of Birth: 1956/04/28   Medicare Important Message Given:  Yes    Joylene Draft, RN 09/28/2015, 8:54 AM

## 2015-09-28 NOTE — Care Management Note (Signed)
Case Management Note  Patient Details  Name: Savannah Jackson MRN: [id number] Date of Birth: 11-29-1955  Subjective/Objective:                  Pt admitted from home with hepatic encephalopathy. Pt lives with her husband and will return home at discharge. Pt is independent with ADL's. Pt has a daughter who is very active in the care of the pt.  Action/Plan: Pt for discharge home today. No Cm needs noted.  Expected Discharge Date:                  Expected Discharge Plan:  Home/Self Care  In-House Referral:  NA  Discharge planning Services  CM Consult  Post Acute Care Choice:  NA Choice offered to:  NA  DME Arranged:    DME Agency:     HH Arranged:    HH Agency:     Status of Service:  Completed, signed off  Medicare Important Message Given:  Yes Date Medicare IM Given:    Medicare IM give by:    Date Additional Medicare IM Given:    Additional Medicare Important Message give by:     If discussed at [place] of Stay Meetings, dates discussed:    Additional Comments:  Joylene Draft, RN 09/28/2015, 8:56 AM

## 2015-09-28 NOTE — Progress Notes (Signed)
Discharged PT per MD order and protocol. Reviewed discharge teaching and handouts given. Prescriptions explained. Pt verbalized understanding and left with all belongings. VSS. IV catheter D/C.  Patient wheeled down by staff member. Oswald Hillock, RN

## 2015-09-29 ENCOUNTER — Encounter: Payer: Self-pay | Admitting: *Deleted

## 2015-09-29 ENCOUNTER — Other Ambulatory Visit: Payer: Self-pay | Admitting: *Deleted

## 2015-09-29 DIAGNOSIS — N390 Urinary tract infection, site not specified: Secondary | ICD-10-CM | POA: Diagnosis not present

## 2015-09-29 DIAGNOSIS — K7290 Hepatic failure, unspecified without coma: Secondary | ICD-10-CM | POA: Diagnosis not present

## 2015-09-29 NOTE — Patient Outreach (Signed)
09/29/15- Telephone call to patient for transition of care week 1, spoke with pt, HIPAA verified, pt states " I went into hospital because my dosage of lactulose was decreased at MD office because of all the diarrhea, now I'm on bigger dose so maybe this won't happen again"  Pt states she has several bowel movements per day, some formed and sometimes diarrhea.  Pt is scared her ammonia level will rise again.  RN CM offered to assist pt in making follow up appointment with primary MD within 7 days and pt refuses stating she is not going to see MD this soon.  Pt reports no new issues today, states she has all medications and taking as prescribed (except xifixan which pt cannot afford).  RN CM reviewed action plan and importance of calling MD early for change in health status, reviewed recognizing change in symptoms (confusion, etc with increasing ammonia levels). RN CM faxed letter to primary MD Dr. Legrand Rams reporting pt is in transition of care program.  [facility] CM Care Plan Problem One        Most Recent Value   Care Plan Problem One  knowledge deficit related to disease process Cirrhosis   Role Documenting the Problem One  Care Management Coordinator   Care Plan for Problem One  Active   [facility] Long Term Goal (31-90 days)  pt will have no hospital readmissions within 90 days   [facility] Long Term Goal Start Date  09/29/15 Barrie Folk restarted- pt hospitalized]   Interventions for Problem One Long Term Goal  RN CM reviewed medications with pt and importance of taking all medications as prescribed, emphasis placed on importance of lactulose.   [facility] CM Short Term Goal #1 Start Date  09/10/15 [goal restarted]   [facility] CM Short Term Goal #1 Met Date  09/16/15 [pt saw MD 09/13/15]     PLAN RN CM to see pt for home visit next week 10/08/15 Continue weekly transition of care.  Jacqlyn Larsen [facility], [place] Coordinator [phone]

## 2015-10-08 ENCOUNTER — Encounter: Payer: Self-pay | Admitting: *Deleted

## 2015-10-08 ENCOUNTER — Other Ambulatory Visit: Payer: Self-pay | Admitting: *Deleted

## 2015-10-08 NOTE — Patient Outreach (Signed)
[facility]) Care Management   10/08/2015  Savannah Jackson 1955/12/20 [id number]  Savannah Jackson is an 59 y.o. female  Subjective: Routine home visit with pt, HIPAA verified, husband present, pt states she has all medications and "just took my last nadolol today and it costs $28.00, I don't have money for that". Pt reports she has been taking lactulose as prescribed and has at least 6 bowel movements per day.  Objective:   Filed Vitals:   10/08/15 1406  BP: 104/58  Pulse: 67  Resp: 16  SpO2: 100%   ROS  Physical Exam  Constitutional: She is oriented to person, place, and time. She appears well-developed and well-nourished.  HENT:  Head: Normocephalic.  Eyes: Pupils are equal, round, and reactive to light.  Neck: Normal range of motion. Neck supple.  Cardiovascular: Normal rate and regular rhythm.   Respiratory: Effort normal and breath sounds normal.  GI: Soft. Bowel sounds are normal.  Musculoskeletal: Normal range of motion. She exhibits edema.  Slight bil ankle edema noted  Neurological: She is alert and oriented to person, place, and time.  Skin: Skin is warm and dry.  Psychiatric: She has a normal mood and affect. Her behavior is normal. Judgment and thought content normal.    Current Medications:   Current Outpatient Prescriptions  Medication Sig Dispense Refill  . folic acid (FOLVITE) 1 MG tablet Take 1 tablet (1 mg total) by mouth daily. 30 tablet 3  . furosemide (LASIX) 40 MG tablet Take 20 mg by mouth daily.     . hydrOXYzine (ATARAX/VISTARIL) 25 MG tablet Take 25 mg by mouth every 6 (six) hours as needed.    . lactulose (CHRONULAC) 10 GM/15ML solution Take 30 mLs (20 g total) by mouth 3 (three) times daily. Four times a day 240 mL 3  . ondansetron (ZOFRAN) 4 MG tablet Take 1 tablet (4 mg total) by mouth every 8 (eight) hours as needed for nausea or vomiting. 30 tablet 1  . spironolactone (ALDACTONE) 100 MG tablet Take 1 tablet (100 mg  total) by mouth daily. 30 tablet 3  . thiamine 100 MG tablet Take 1 tablet (100 mg total) by mouth daily. 30 tablet 3  . nadolol (CORGARD) 20 MG tablet Take 0.5 tablets (10 mg total) by mouth daily. (Patient not taking: Reported on 10/08/2015) 30 tablet 3  . rifaximin (XIFAXAN) 550 MG TABS tablet Take 550 mg by mouth 2 (two) times daily.     No current facility-administered medications for this visit.    Functional Status:   In your present state of health, do you have any difficulty performing the following activities: 09/26/2015 09/24/2015  Hearing? N N  Vision? N -  Difficulty concentrating or making decisions? - Y  Walking or climbing stairs? - Y  Dressing or bathing? N -  Doing errands, shopping? - N    Fall/Depression Screening:    PHQ 2/9 Scores 09/10/2015  PHQ - 2 Score 0    Assessment:  RN CM sent In Basket to [place] pharmacist reporting pt cannot afford nadolol and took her last pill today.  RN CM reviewed action plan with pt, husband and gave copy of and reviewed "know before you go" in choosing whether to go to primary MD, urgent care, or ED, gave [facility] calendar for 2017.  [facility] CM Care Plan Problem One        Most Recent Value   Care Plan Problem One  knowledge deficit related to  disease process Cirrhosis   Role Documenting the Problem One  Care Management Coordinator   Care Plan for Problem One  Active   [facility] Long Term Goal (31-90 days)  pt will have no hospital readmissions within 90 days   [facility] Long Term Goal Start Date  09/29/15 Barrie Folk restarted- pt hospitalized]   Interventions for Problem One Long Term Goal  RN CM reviewed and reinforced medications with pt and importance of taking all medications as prescribed, emphasis placed on importance of lactulose.   [facility] CM Short Term Goal #1 Start Date  09/10/15 [goal restarted]   [facility] CM Short Term Goal #1 Met Date  09/16/15 [pt saw MD 09/13/15]    [facility] CM Care Plan Problem Two        Most Recent Value   [facility] CM Short  Term Goal #1 (0-30 days)  pt will verbalize increased endurance with activity tolerance within 30 days.   [facility] CM Short Term Goal #1 Start Date  09/17/15   Interventions for Short Term Goal #2   RN CM reviewed importance of walking or doing some type of activity daily.      Plan: continue weekly transition of care calls See pt for home visit on 11/04/14   Jacqlyn Larsen [facility], [place] Coordinator [phone]

## 2015-10-14 ENCOUNTER — Ambulatory Visit: Payer: Commercial Managed Care - HMO | Admitting: *Deleted

## 2015-10-14 ENCOUNTER — Other Ambulatory Visit: Payer: Self-pay | Admitting: *Deleted

## 2015-10-14 NOTE — Patient Outreach (Signed)
10/14/15- Telephone call to patient for transition of care week 3, no answer to telephone and no option to leave voicemail.  Jacqlyn Larsen [facility], [place] Coordinator [phone]

## 2015-10-19 ENCOUNTER — Other Ambulatory Visit: Payer: Self-pay

## 2015-10-19 ENCOUNTER — Inpatient Hospital Stay (HOSPITAL_COMMUNITY)
Admission: EM | Admit: 2015-10-19 | Discharge: 2015-10-22 | DRG: 442 | Disposition: A | Discharge status: Home / Self Care | Payer: Commercial Managed Care - HMO | Attending: Internal Medicine | Admitting: Internal Medicine

## 2015-10-19 ENCOUNTER — Emergency Department (HOSPITAL_COMMUNITY): Payer: Commercial Managed Care - HMO

## 2015-10-19 ENCOUNTER — Encounter (HOSPITAL_COMMUNITY): Payer: Self-pay | Admitting: Emergency Medicine

## 2015-10-19 DIAGNOSIS — N179 Acute kidney failure, unspecified: Secondary | ICD-10-CM | POA: Diagnosis not present

## 2015-10-19 DIAGNOSIS — E872 Acidosis, unspecified: Secondary | ICD-10-CM | POA: Diagnosis present

## 2015-10-19 DIAGNOSIS — D72829 Elevated white blood cell count, unspecified: Secondary | ICD-10-CM | POA: Diagnosis present

## 2015-10-19 DIAGNOSIS — R748 Abnormal levels of other serum enzymes: Secondary | ICD-10-CM | POA: Diagnosis present

## 2015-10-19 DIAGNOSIS — R4182 Altered mental status, unspecified: Secondary | ICD-10-CM | POA: Diagnosis not present

## 2015-10-19 DIAGNOSIS — Z853 Personal history of malignant neoplasm of breast: Secondary | ICD-10-CM

## 2015-10-19 DIAGNOSIS — E860 Dehydration: Secondary | ICD-10-CM | POA: Diagnosis present

## 2015-10-19 DIAGNOSIS — K7290 Hepatic failure, unspecified without coma: Principal | ICD-10-CM | POA: Diagnosis present

## 2015-10-19 DIAGNOSIS — I10 Essential (primary) hypertension: Secondary | ICD-10-CM | POA: Diagnosis not present

## 2015-10-19 DIAGNOSIS — K7460 Unspecified cirrhosis of liver: Secondary | ICD-10-CM | POA: Diagnosis not present

## 2015-10-19 DIAGNOSIS — Z96651 Presence of right artificial knee joint: Secondary | ICD-10-CM | POA: Diagnosis not present

## 2015-10-19 DIAGNOSIS — K7031 Alcoholic cirrhosis of liver with ascites: Secondary | ICD-10-CM | POA: Diagnosis not present

## 2015-10-19 DIAGNOSIS — R778 Other specified abnormalities of plasma proteins: Secondary | ICD-10-CM | POA: Diagnosis present

## 2015-10-19 DIAGNOSIS — R7989 Other specified abnormal findings of blood chemistry: Secondary | ICD-10-CM | POA: Diagnosis not present

## 2015-10-19 DIAGNOSIS — G9340 Encephalopathy, unspecified: Secondary | ICD-10-CM | POA: Diagnosis not present

## 2015-10-19 DIAGNOSIS — K7682 Hepatic encephalopathy: Secondary | ICD-10-CM | POA: Diagnosis present

## 2015-10-19 DIAGNOSIS — F1021 Alcohol dependence, in remission: Secondary | ICD-10-CM | POA: Diagnosis not present

## 2015-10-19 LAB — COMPREHENSIVE METABOLIC PANEL
ALK PHOS: 146 U/L — AB (ref 38–126)
ALT: 24 U/L (ref 14–54)
AST: 54 U/L — AB (ref 15–41)
Albumin: 2 g/dL — ABNORMAL LOW (ref 3.5–5.0)
Anion gap: 7 (ref 5–15)
BILIRUBIN TOTAL: 1.6 mg/dL — AB (ref 0.3–1.2)
BUN: 34 mg/dL — AB (ref 6–20)
CHLORIDE: 117 mmol/L — AB (ref 101–111)
CO2: 10 mmol/L — ABNORMAL LOW (ref 22–32)
CREATININE: 2.08 mg/dL — AB (ref 0.44–1.00)
Calcium: 8 mg/dL — ABNORMAL LOW (ref 8.9–10.3)
GFR calc Af Amer: 29 mL/min — ABNORMAL LOW (ref >60)
GFR, EST NON AFRICAN AMERICAN: 25 mL/min — AB (ref >60)
Glucose, Bld: 102 mg/dL — ABNORMAL HIGH (ref 65–99)
Potassium: 4.5 mmol/L (ref 3.5–5.1)
Sodium: 134 mmol/L — ABNORMAL LOW (ref 135–145)
Total Protein: 5.9 g/dL — ABNORMAL LOW (ref 6.5–8.1)

## 2015-10-19 LAB — URINE MICROSCOPIC-ADD ON

## 2015-10-19 LAB — URINALYSIS, ROUTINE W REFLEX MICROSCOPIC
Bilirubin Urine: NEGATIVE
Glucose, UA: NEGATIVE mg/dL
Ketones, ur: NEGATIVE mg/dL
LEUKOCYTES UA: NEGATIVE
NITRITE: NEGATIVE
PH: 5.5 (ref 5.0–8.0)
SPECIFIC GRAVITY, URINE: 1.01 (ref 1.005–1.030)

## 2015-10-19 LAB — CBC WITH DIFFERENTIAL/PLATELET
BASOS ABS: 0.2 10*3/uL — AB (ref 0.0–0.1)
Basophils Relative: 1 %
Eosinophils Absolute: 0.5 10*3/uL (ref 0.0–0.7)
Eosinophils Relative: 3 %
HEMATOCRIT: 30 % — AB (ref 36.0–46.0)
HEMOGLOBIN: 10.1 g/dL — AB (ref 12.0–15.0)
LYMPHS PCT: 13 %
Lymphs Abs: 2.8 10*3/uL (ref 0.7–4.0)
MCH: 26.9 pg (ref 26.0–34.0)
MCHC: 33.7 g/dL (ref 30.0–36.0)
MCV: 80 fL (ref 78.0–100.0)
Monocytes Absolute: 1.8 10*3/uL — ABNORMAL HIGH (ref 0.1–1.0)
Monocytes Relative: 8 %
NEUTROS ABS: 16.4 10*3/uL — AB (ref 1.7–7.7)
Neutrophils Relative %: 75 %
Platelets: 175 10*3/uL (ref 150–400)
RBC: 3.75 MIL/uL — AB (ref 3.87–5.11)
RDW: 19.4 % — ABNORMAL HIGH (ref 11.5–15.5)
WBC: 21.7 10*3/uL — AB (ref 4.0–10.5)

## 2015-10-19 LAB — TROPONIN I
TROPONIN I: 0.14 ng/mL — AB (ref <0.031)
TROPONIN I: 0.12 ng/mL — AB (ref <0.031)

## 2015-10-19 LAB — AMMONIA: AMMONIA: 206 umol/L — AB (ref 9–35)

## 2015-10-19 MED ORDER — ENOXAPARIN SODIUM 30 MG/0.3ML ~~LOC~~ SOLN
30.0000 mg | SUBCUTANEOUS | Status: DC
  Administered 2015-10-19: 30 mg via SUBCUTANEOUS
  Filled 2015-10-19: qty 0.3

## 2015-10-19 MED ORDER — ACETAMINOPHEN 650 MG RE SUPP: 650.0000 mg | Freq: Four times a day (QID) | RECTAL | Status: DC | PRN

## 2015-10-19 MED ORDER — DEXTROSE 5 % IV SOLN
2.0000 g | INTRAVENOUS | Status: DC
  Administered 2015-10-20 – 2015-10-21 (×3): 2 g via INTRAVENOUS
  Filled 2015-10-19 (×6): qty 2

## 2015-10-19 MED ORDER — ONDANSETRON HCL 4 MG PO TABS: 4.0000 mg | ORAL_TABLET | Freq: Four times a day (QID) | ORAL | Status: DC | PRN

## 2015-10-19 MED ORDER — LACTULOSE ENEMA
300.0000 mL | Freq: Once | ORAL | Status: AC
  Administered 2015-10-19: 300 mL via RECTAL
  Filled 2015-10-19: qty 300

## 2015-10-19 MED ORDER — LACTULOSE ENEMA
300.0000 mL | Freq: Three times a day (TID) | ORAL | Status: DC
  Administered 2015-10-19 – 2015-10-20 (×3): 300 mL via RECTAL
  Filled 2015-10-19 (×8): qty 300

## 2015-10-19 MED ORDER — STERILE WATER FOR INJECTION IJ SOLN
INTRAMUSCULAR | Status: AC
  Administered 2015-10-19: 0.6 mL
  Filled 2015-10-19: qty 10

## 2015-10-19 MED ORDER — LORAZEPAM 2 MG/ML IJ SOLN
2.0000 mg | Freq: Once | INTRAMUSCULAR | Status: AC
  Administered 2015-10-19: 2 mg via INTRAMUSCULAR
  Filled 2015-10-19: qty 1

## 2015-10-19 MED ORDER — ZIPRASIDONE MESYLATE 20 MG IM SOLR
10.0000 mg | Freq: Once | INTRAMUSCULAR | Status: AC
  Administered 2015-10-19: 10 mg via INTRAMUSCULAR
  Filled 2015-10-19: qty 20

## 2015-10-19 MED ORDER — CETYLPYRIDINIUM CHLORIDE 0.05 % MT LIQD
7.0000 mL | Freq: Two times a day (BID) | OROMUCOSAL | Status: DC
  Administered 2015-10-19 – 2015-10-21 (×5): 7 mL via OROMUCOSAL

## 2015-10-19 MED ORDER — CEFTRIAXONE SODIUM 2 G IJ SOLR
INTRAMUSCULAR | Status: AC
  Filled 2015-10-19: qty 2

## 2015-10-19 MED ORDER — SODIUM CHLORIDE 0.9 % IV BOLUS (SEPSIS)
2000.0000 mL | Freq: Once | INTRAVENOUS | Status: AC
  Administered 2015-10-19: 2000 mL via INTRAVENOUS

## 2015-10-19 MED ORDER — SODIUM BICARBONATE 8.4 % IV SOLN
INTRAVENOUS | Status: AC
  Filled 2015-10-19: qty 150

## 2015-10-19 MED ORDER — RIFAXIMIN 550 MG PO TABS
550.0000 mg | ORAL_TABLET | Freq: Two times a day (BID) | ORAL | Status: DC
  Administered 2015-10-19 – 2015-10-21 (×5): 550 mg via ORAL
  Filled 2015-10-19 (×5): qty 1

## 2015-10-19 MED ORDER — SODIUM BICARBONATE 8.4 % IV SOLN
INTRAVENOUS | Status: DC
  Administered 2015-10-19 – 2015-10-21 (×2): via INTRAVENOUS
  Filled 2015-10-19 (×6): qty 1000

## 2015-10-19 MED ORDER — ONDANSETRON HCL 4 MG/2ML IJ SOLN: 4.0000 mg | Freq: Four times a day (QID) | INTRAMUSCULAR | Status: DC | PRN

## 2015-10-19 MED ORDER — ACETAMINOPHEN 325 MG PO TABS
650.0000 mg | ORAL_TABLET | Freq: Four times a day (QID) | ORAL | Status: DC | PRN
  Administered 2015-10-21: 650 mg via ORAL
  Filled 2015-10-19: qty 2

## 2015-10-19 NOTE — ED Notes (Signed)
This nurse attempted to look for IV on patient, patient hitting and swinging and yelling stop.

## 2015-10-19 NOTE — Patient Outreach (Signed)
I called Savannah Jackson and spoke with her husband who stated he just took her to the hospital.  He stated her kidneys were not acting right.  I asked if they had heard about the Xifaxin and he said that she was approved.  I told him I would reach out to them again after Savannah Jackson was discharged from the ED or the hospital.  He stated that would be fine.  I have notified Jacqlyn Larsen, RN, nurse care manager that she is in the ED.     Deanne Coffer, PharmD, [place] [phone]

## 2015-10-19 NOTE — ED Notes (Addendum)
Went in to assess patient. Pt oriented to self, time, and place at this time. Pt refusing assessment,ekg, or to get in the bed/put gown on. Pt repeadily states ," I am fine, don't touch me." Family at bedside, tearful. Family states, "yall are going to have to force care on her." Talked with family and reported that care can not be forced upon patients that are alert and oriented. Family states, "we should have taken her to [place]." Family aware that EDP will be in to assess patient soon. Lab at bedside obtaining blood work. nad noted.

## 2015-10-19 NOTE — ED Provider Notes (Signed)
CSN: [id number]     Arrival date & time 10/19/15  1311 History   First MD Initiated Contact with Patient 10/19/15 1520     Chief Complaint  Patient presents with  . Altered Mental Status  . Abdominal Pain     Level V caveat: Altered mental status  HPI Family reports increasing altered mental status over the past several days.  They report decreased intake.  Family reports this is how she acts when her ammonia levels rise.  She has a history of alcoholic cirrhosis.  She is usually compliant with her medications.  She was recently admitted several weeks ago for similar symptoms and at that time had a urinary tract infection.  She resolved with lactulose treatment.  Report no change in her bowel habits.  No reports of vomiting.  No reports of fever.  Patient is arousable to voice and is extremely irritable.  She'll not provide any reasonable history.   Past Medical History  Diagnosis Date  . Hypertension   . Brain aneurysm   . Rotator cuff syndrome of left shoulder   . DDD (degenerative disc disease), lumbar   . Chronic back pain Diverticultis  . Cirrhosis ([place])     alcoholic  . Sleep apnea   . ETOH abuse   . Bilateral leg edema   . Hepatomegaly     scope 2014  . Diverticulosis     scope 2014  . Cancer [facility])     breast cancer   Past Surgical History  Procedure Laterality Date  . Cholecystectomy    . Brain surgery    . Breast surgery    . Total knee arthroplasty      right. 2002  . Cataract extraction Bilateral     [facility] 2 or 3 years ago  . Colonoscopy N/A 04/10/2013    Procedure: COLONOSCOPY;  Surgeon: Rogene Houston, MD;  [facility];  Service: Endoscopy;  Laterality: N/A;  1030-rescheduled to 1200 Ann notified pt  . Esophagogastroduodenoscopy N/A 04/22/2015    Procedure: ESOPHAGOGASTRODUODENOSCOPY (EGD);  Surgeon: Rogene Houston, MD;  Location: [facility];  Service: Endoscopy;  Laterality: N/A;  . Radiology with anesthesia N/A 07/16/2015    Procedure: TIPS;   Surgeon: Medication Radiologist, MD;  Location: [place];  Service: Radiology;  Laterality: N/A;   History reviewed. No pertinent family history. Social History  Substance Use Topics  . Smoking status: Never Smoker   . Smokeless tobacco: Never Used  . Alcohol Use: No     Comment: none since 03/2015   OB History    Gravida Para Term Preterm AB TAB SAB Ectopic Multiple Living            1     Review of Systems  Unable to perform ROS: Mental status change      Allergies  Latex and Vicodin  Home Medications   Prior to Admission medications   Medication Sig Start Date End Date Taking? Authorizing Provider  folic acid (FOLVITE) 1 MG tablet Take 1 tablet (1 mg total) by mouth daily. 04/29/15   Rosita Fire, MD  furosemide (LASIX) 40 MG tablet Take 20 mg by mouth daily.     Historical Provider, MD  hydrOXYzine (ATARAX/VISTARIL) 25 MG tablet Take 25 mg by mouth every 6 (six) hours as needed.    Historical Provider, MD  lactulose (CHRONULAC) 10 GM/15ML solution Take 30 mLs (20 g total) by mouth 3 (three) times daily. Four times a day 09/28/15  Rosita Fire, MD  nadolol (CORGARD) 20 MG tablet Take 0.5 tablets (10 mg total) by mouth daily. Patient not taking: Reported on 10/08/2015 07/18/15   Ripudeep Krystal Eaton, MD  ondansetron (ZOFRAN) 4 MG tablet Take 1 tablet (4 mg total) by mouth every 8 (eight) hours as needed for nausea or vomiting. 08/10/15   Butch Penny, NP  rifaximin (XIFAXAN) 550 MG TABS tablet Take 550 mg by mouth 2 (two) times daily.    Jacqulynn Cadet, MD  spironolactone (ALDACTONE) 100 MG tablet Take 1 tablet (100 mg total) by mouth daily. 07/18/15   Ripudeep Krystal Eaton, MD  thiamine 100 MG tablet Take 1 tablet (100 mg total) by mouth daily. 04/29/15   Rosita Fire, MD   BP 125/64 mmHg  Pulse 75  Temp(Src) 97.5 F (36.4 C) (Oral)  Resp 14  Ht 5\' 3"  (1.6 m)  Wt 145 lb (65.772 kg)  BMI 25.69 kg/m2  SpO2 100% Physical Exam  Constitutional: She appears well-developed and  well-nourished. No distress.  HENT:  Head: Normocephalic and atraumatic.  Eyes: EOM are normal.  Neck: Normal range of motion.  Cardiovascular: Normal rate, regular rhythm and normal heart sounds.   Pulmonary/Chest: Effort normal and breath sounds normal.  Abdominal: Soft. She exhibits no distension. There is no tenderness.  Musculoskeletal: Normal range of motion.  Neurological:  (Eyes to voice and painful stimuli.  Does not cooperate with neurologic examination.  Appears to move all 4 extremities and conversation however  Skin: Skin is warm and dry.  Psychiatric: She has a normal mood and affect. Judgment normal.  Nursing note and vitals reviewed.   ED Course  Procedures (including critical care time) Labs Review Labs Reviewed  CBC WITH DIFFERENTIAL/PLATELET - Abnormal; Notable for the following:    WBC 21.7 (*)    RBC 3.75 (*)    Hemoglobin 10.1 (*)    HCT 30.0 (*)    RDW 19.4 (*)    Neutro Abs 16.4 (*)    Monocytes Absolute 1.8 (*)    Basophils Absolute 0.2 (*)    All other components within normal limits  COMPREHENSIVE METABOLIC PANEL - Abnormal; Notable for the following:    Sodium 134 (*)    Chloride 117 (*)    CO2 10 (*)    Glucose, Bld 102 (*)    BUN 34 (*)    Creatinine, Ser 2.08 (*)    Calcium 8.0 (*)    Total Protein 5.9 (*)    Albumin 2.0 (*)    AST 54 (*)    Alkaline Phosphatase 146 (*)    Total Bilirubin 1.6 (*)    GFR calc non Af Amer 25 (*)    GFR calc Af Amer 29 (*)    All other components within normal limits  AMMONIA - Abnormal; Notable for the following:    Ammonia 206 (*)    All other components within normal limits  URINALYSIS, ROUTINE W REFLEX MICROSCOPIC (NOT AT [facility]) - Abnormal; Notable for the following:    Hgb urine dipstick SMALL (*)    Protein, ur TRACE (*)    All other components within normal limits  TROPONIN I - Abnormal; Notable for the following:    Troponin I 0.14 (*)    All other components within normal limits  URINE  MICROSCOPIC-ADD ON - Abnormal; Notable for the following:    Squamous Epithelial / LPF 6-30 (*)    Bacteria, UA FEW (*)    Casts HYALINE CASTS (*)  All other components within normal limits  URINE CULTURE   BUN  Date Value Ref Range Status  10/19/2015 34* 6 - 20 mg/dL Final  09/27/2015 18 6 - 20 mg/dL Final  09/26/2015 21* 6 - 20 mg/dL Final  09/25/2015 23* 6 - 20 mg/dL Final   CREAT  Date Value Ref Range Status  08/03/2015 1.05 0.50 - 1.05 mg/dL Final  06/24/2015 1.31* 0.50 - 1.05 mg/dL Final  06/15/2015 2.10* 0.50 - 1.05 mg/dL Final  05/13/2015 0.90 0.50 - 1.10 mg/dL Final   CREATININE, SER  Date Value Ref Range Status  10/19/2015 2.08* 0.44 - 1.00 mg/dL Final  09/27/2015 0.87 0.44 - 1.00 mg/dL Final  09/26/2015 0.98 0.44 - 1.00 mg/dL Final  09/25/2015 1.09* 0.44 - 1.00 mg/dL Final       Imaging Review No results found. I have personally reviewed and evaluated these images and lab results as part of my medical decision-making.   EKG Interpretation   Date/Time:  Tuesday October 19 2015 16:06:52 EST Ventricular Rate:  72 PR Interval:  174 QRS Duration: 104 QT Interval:  443 QTC Calculation: 485 R Axis:   34 Text Interpretation:  Sinus rhythm Abnormal R-wave progression, early  transition Repol abnrm however, unchanged from prior ecg 09/24/15  Confirmed by Birt Reinoso  MD, Lennette Bihari ([id number]) on 10/19/2015 4:20:39 PM      MDM   Final diagnoses:  Hepatic encephalopathy ([place])  AKI (acute kidney injury) ([place])  Elevated troponin    Patient required intramuscular Geodon and Ativan in order to perform necessary procedures including IV start and administration of lactulose.  Patient with hepatic encephalopathy with ammonia level greater than 200.  Urinalysis shows no signs of infection.  Patient does have acute kidney injury as well.  She will begin IV hydration at this time.  EKG without changes from prior.  Mild elevation troponin likely supply demand as well as renal  failure.  This need to be serially checked while in the hospital.  My suspicion for acute coronary syndrome is low at this time.    Jola Schmidt, MD 10/19/15 [phone]

## 2015-10-19 NOTE — H&P (Signed)
[facility] History and Physical  Savannah Jackson [id number] DOB: [birth year] DOA: 10/19/2015  Referring physician: Dr. Venora Maples PCP: Savannah Fire, MD   Chief Complaint: Altered mental status  HPI: Savannah Jackson is a 59 y.o. female with a history of alcoholic cirrhosis status post TIPS procedure in 07/2015. According to her family she was doing fairly well up until yesterday when she complained of some abdominal pain and had "shakes". Today, she became increasingly confused and had agitated behavior. There is no reported fever. No nausea or vomiting. She has Loose stools related to her lactulose therapy. She is brought to the ER for evaluation where ammonia level was noted to be greater than 200. She is being admitted for further treatments of hepatic encephalopathy.   Review of Systems:  Pertinent positives as per history of present illness, otherwise negative  Past Medical History  Diagnosis Date  . Hypertension   . Brain aneurysm   . Rotator cuff syndrome of left shoulder   . DDD (degenerative disc disease), lumbar   . Chronic back pain Diverticultis  . Cirrhosis ([place])     alcoholic  . Sleep apnea   . ETOH abuse   . Bilateral leg edema   . Hepatomegaly     scope 2014  . Diverticulosis     scope 2014  . Cancer [facility])     breast cancer   Past Surgical History  Procedure Laterality Date  . Cholecystectomy    . Brain surgery    . Breast surgery    . Total knee arthroplasty      right. 2002  . Cataract extraction Bilateral     [facility] 2 or 3 years ago  . Colonoscopy N/A 04/10/2013    Procedure: COLONOSCOPY;  Surgeon: Rogene Houston, MD;  [facility];  Service: Endoscopy;  Laterality: N/A;  1030-rescheduled to 1200 Ann notified pt  . Esophagogastroduodenoscopy N/A 04/22/2015    Procedure: ESOPHAGOGASTRODUODENOSCOPY (EGD);  Surgeon: Rogene Houston, MD;  [facility];  Service: Endoscopy;  Laterality: N/A;  . Radiology with anesthesia N/A  07/16/2015    Procedure: TIPS;  Surgeon: Medication Radiologist, MD;  Location: [place];  Service: Radiology;  Laterality: N/A;   Social History:  reports that she has never smoked. She has never used smokeless tobacco. She reports that she does not drink alcohol or use illicit drugs.  Allergies  Allergen Reactions  . Latex Swelling    Discoloration of skin.   . Vicodin [Hydrocodone-Acetaminophen] Itching    Family History: mother died of complications of alcoholic cirrhosis  Prior to Admission medications   Medication Sig Start Date End Date Taking? Authorizing Provider  folic acid (FOLVITE) 1 MG tablet Take 1 tablet (1 mg total) by mouth daily. 04/29/15   Savannah Fire, MD  furosemide (LASIX) 40 MG tablet Take 20 mg by mouth daily.     Historical Provider, MD  hydrOXYzine (ATARAX/VISTARIL) 25 MG tablet Take 25 mg by mouth every 6 (six) hours as needed.    Historical Provider, MD  lactulose (CHRONULAC) 10 GM/15ML solution Take 30 mLs (20 g total) by mouth 3 (three) times daily. Four times a day 09/28/15   Savannah Fire, MD  nadolol (CORGARD) 20 MG tablet Take 0.5 tablets (10 mg total) by mouth daily. Patient not taking: Reported on 10/08/2015 07/18/15   Ripudeep Krystal Eaton, MD  ondansetron (ZOFRAN) 4 MG tablet Take 1 tablet (4 mg total) by mouth every 8 (eight) hours as needed for nausea or  vomiting. 08/10/15   Butch Penny, NP  rifaximin (XIFAXAN) 550 MG TABS tablet Take 550 mg by mouth 2 (two) times daily.    Jacqulynn Cadet, MD  spironolactone (ALDACTONE) 100 MG tablet Take 1 tablet (100 mg total) by mouth daily. 07/18/15   Ripudeep Krystal Eaton, MD  thiamine 100 MG tablet Take 1 tablet (100 mg total) by mouth daily. 04/29/15   Savannah Fire, MD   Physical Exam: Filed Vitals:   10/19/15 1700 10/19/15 1727 10/19/15 1730 10/19/15 1817  BP: 108/69  106/63 88/51  Pulse: 81  77 74  Temp:  97.1 F (36.2 C)    TempSrc:  Rectal    Resp: 19  19 18   Height:      Weight:      SpO2: 99%  100% 100%     Wt Readings from Last 3 Encounters:  10/19/15 65.772 kg (145 lb)  09/28/15 66.5 kg (146 lb 9.7 oz)  09/20/15 65.908 kg (145 lb 4.8 oz)    General:  Patient is laying in bed, sedated Eyes: PERRL, normal lids, irises & conjunctiva ENT: dry mucous membranes Neck: no LAD, masses or thyromegaly Cardiovascular: RRR, no m/r/g. trace LE edema. Telemetry: SR, no arrhythmias  Respiratory: CTA bilaterally, no w/r/r. Normal respiratory effort. Abdomen: soft, ntnd Skin: no rash or induration seen on limited exam Musculoskeletal: grossly normal tone BUE/BLE Psychiatric: unable to assess due to mental status Neurologic: moving limbs spontaneously          Labs on Admission:  Basic Metabolic Panel:  Recent Labs Lab 10/19/15 1442  NA 134*  K 4.5  CL 117*  CO2 10*  GLUCOSE 102*  BUN 34*  CREATININE 2.08*  CALCIUM 8.0*   Liver Function Tests:  Recent Labs Lab 10/19/15 1442  AST 54*  ALT 24  ALKPHOS 146*  BILITOT 1.6*  PROT 5.9*  ALBUMIN 2.0*   No results for input(s): LIPASE, AMYLASE in the last 168 hours.  Recent Labs Lab 10/19/15 1443  AMMONIA 206*   CBC:  Recent Labs Lab 10/19/15 1442  WBC 21.7*  NEUTROABS 16.4*  HGB 10.1*  HCT 30.0*  MCV 80.0  PLT 175   Cardiac Enzymes:  Recent Labs Lab 10/19/15 1442  TROPONINI 0.14*    BNP (last 3 results)  Recent Labs  07/03/15 1330  BNP 241.0*    ProBNP (last 3 results) No results for input(s): PROBNP in the last 8760 hours.  CBG: No results for input(s): GLUCAP in the last 168 hours.  Radiological Exams on Admission: Dg Chest Portable 1 View  10/19/2015  CLINICAL DATA:  Altered mental status, combative, abdominal pain, history breast cancer, hypertension, cirrhosis, ethanol abuse EXAM: PORTABLE CHEST 1 VIEW COMPARISON:  Portable exam 1827 hours compared to 09/24/2015 FINDINGS: Upper normal heart size. Mediastinal contours and pulmonary vascularity normal. Slight rotation to the RIGHT. Lungs clear.  No pleural effusion or pneumothorax. Bones demineralized. IMPRESSION: No acute abnormalities. Electronically Signed   By: Lavonia Dana M.D.   On: 10/19/2015 18:39    EKG: Independently reviewed. EKG does not show any acute changes  Assessment/Plan Active Problems:   Alcoholic cirrhosis of liver with ascites ([facility])   Hepatic encephalopathy ([facility])   Elevated troponin   Acute renal failure (ARF) ([facility])   Metabolic acidosis   1. Hepatic encephalopathy. At this time patient is very agitated and is refusing to take lactulose by mouth. She has been sedated by ER physician and we will attempt to give lactulose rectally via rectal  tube. Patient continues to be agitated, refuses further treatments, may need to consider elective intubation in order to receive appropriate therapy. Continue Xifaxan once she is able to take by mouth . 2. Acute renal failure. Related to dehydration. We'll start the patient on IV fluids. 3. Non-anion gap Metabolic acidosis, likely related to dehydration. We'll start the patient on bicarbonate infusion. 4. Elevated troponin. I suspect this is demand ischemia. We'll continue to cycle troponins. 5. Alcoholic cirrhosis of the liver status post TIPS procedure. 6. Leukocytosis. Likely reactive to dehydration and encephalopathy. Will send blood cultures. She did complain of abdominal pain. We'll start the patient empirically on Rocephin for now to treat any possible SBP.  7. Hypotension. Likely related to volume depletion. Will request central venous access in case vasopressors are needed. Continue with hydration for now. 8.     Code Status: full code DVT Prophylaxis: lovenox Family Communication: discussed with family at the bedside Disposition Plan: admit to ICU  Time spent: 74mins  Savannah Jackson [facility] Pager [phone]

## 2015-10-19 NOTE — ED Notes (Addendum)
Pt family was able to get patient into bed. Pt resting upon entering room and in a side lying position. Cardiac monitor,bp cuff, and pulse ox on pt at this time. nad noted. Pt still reporting, "I am fine." Pt awaiting EDP assessment.

## 2015-10-19 NOTE — ED Notes (Signed)
Here at [facility] 2 or 3 weeks ago for AMS.  Poor appetite.  C/o abdominal pain, rates pain 10/10.  Family says pt is confused.

## 2015-10-20 ENCOUNTER — Other Ambulatory Visit: Payer: Self-pay | Admitting: *Deleted

## 2015-10-20 ENCOUNTER — Ambulatory Visit: Payer: Self-pay | Admitting: *Deleted

## 2015-10-20 LAB — CBC
HCT: 25.7 % — ABNORMAL LOW (ref 36.0–46.0)
HEMOGLOBIN: 8.8 g/dL — AB (ref 12.0–15.0)
MCH: 27.2 pg (ref 26.0–34.0)
MCHC: 34.2 g/dL (ref 30.0–36.0)
MCV: 79.3 fL (ref 78.0–100.0)
PLATELETS: 161 10*3/uL (ref 150–400)
RBC: 3.24 MIL/uL — AB (ref 3.87–5.11)
RDW: 19.6 % — ABNORMAL HIGH (ref 11.5–15.5)
WBC: 12.6 10*3/uL — ABNORMAL HIGH (ref 4.0–10.5)

## 2015-10-20 LAB — COMPREHENSIVE METABOLIC PANEL
ALT: 18 U/L (ref 14–54)
AST: 39 U/L (ref 15–41)
Albumin: 1.6 g/dL — ABNORMAL LOW (ref 3.5–5.0)
Alkaline Phosphatase: 105 U/L (ref 38–126)
Anion gap: 6 (ref 5–15)
BUN: 33 mg/dL — ABNORMAL HIGH (ref 6–20)
CHLORIDE: 126 mmol/L — AB (ref 101–111)
CO2: 11 mmol/L — AB (ref 22–32)
CREATININE: 1.73 mg/dL — AB (ref 0.44–1.00)
Calcium: 8.1 mg/dL — ABNORMAL LOW (ref 8.9–10.3)
GFR calc non Af Amer: 31 mL/min — ABNORMAL LOW (ref >60)
GFR, EST AFRICAN AMERICAN: 36 mL/min — AB (ref >60)
Glucose, Bld: 113 mg/dL — ABNORMAL HIGH (ref 65–99)
Potassium: 4 mmol/L (ref 3.5–5.1)
SODIUM: 143 mmol/L (ref 135–145)
Total Bilirubin: 1 mg/dL (ref 0.3–1.2)
Total Protein: 4.5 g/dL — ABNORMAL LOW (ref 6.5–8.1)

## 2015-10-20 LAB — AMMONIA: Ammonia: 29 umol/L (ref 9–35)

## 2015-10-20 LAB — TROPONIN I
Troponin I: 0.11 ng/mL — ABNORMAL HIGH (ref <0.031)
Troponin I: 0.12 ng/mL — ABNORMAL HIGH (ref <0.031)

## 2015-10-20 MED ORDER — ENOXAPARIN SODIUM 40 MG/0.4ML ~~LOC~~ SOLN
40.0000 mg | SUBCUTANEOUS | Status: DC
  Administered 2015-10-20 – 2015-10-21 (×2): 40 mg via SUBCUTANEOUS
  Filled 2015-10-20 (×2): qty 0.4

## 2015-10-20 NOTE — Progress Notes (Signed)
Subjective: Patient was admitted due to change in mental status secondary hepatic encephalopathy. On admission her ammonia level was 206, however this morning is it is 29. Patient is more awake but still somewhat confused.  Objective: Vital signs in last 24 hours: Temp:  [97.1 F (36.2 C)-98.3 F (36.8 C)] 97.1 F (36.2 C) (12/21 0804) Pulse Rate:  [55-85] 55 (12/21 0500) Resp:  [14-21] 20 (12/21 0500) BP: (85-147)/(51-113) 95/64 mmHg (12/21 0500) SpO2:  [96 %-100 %] 100 % (12/21 0500) Weight:  [65.772 kg (145 lb)-66.1 kg (145 lb 11.6 oz)] 66.1 kg (145 lb 11.6 oz) (12/21 0500) Weight change:     Intake/Output from previous day: 12/20 0701 - 12/21 0700 In: -  Out: 800 [Urine:800]  PHYSICAL EXAM General appearance: no distress and slowed mentation Resp: clear to auscultation bilaterally Cardio: S1, S2 normal GI: soft, non-tender; bowel sounds normal; no masses,  no organomegaly Extremities: extremities normal, atraumatic, no cyanosis or edema  Lab Results:  Results for orders placed or performed during the hospital encounter of 10/19/15 (from the past 48 hour(s))  CBC with Differential     Status: Abnormal   Collection Time: 10/19/15  2:42 PM  Result Value Ref Range   WBC 21.7 (H) 4.0 - 10.5 K/uL   RBC 3.75 (L) 3.87 - 5.11 MIL/uL   Hemoglobin 10.1 (L) 12.0 - 15.0 g/dL   HCT 30.0 (L) 36.0 - 46.0 %   MCV 80.0 78.0 - 100.0 fL   MCH 26.9 26.0 - 34.0 pg   MCHC 33.7 30.0 - 36.0 g/dL   RDW 19.4 (H) 11.5 - 15.5 %   Platelets 175 150 - 400 K/uL   Neutrophils Relative % 75 %   Neutro Abs 16.4 (H) 1.7 - 7.7 K/uL   Lymphocytes Relative 13 %   Lymphs Abs 2.8 0.7 - 4.0 K/uL   Monocytes Relative 8 %   Monocytes Absolute 1.8 (H) 0.1 - 1.0 K/uL   Eosinophils Relative 3 %   Eosinophils Absolute 0.5 0.0 - 0.7 K/uL   Basophils Relative 1 %   Basophils Absolute 0.2 (H) 0.0 - 0.1 K/uL  Comprehensive metabolic panel     Status: Abnormal   Collection Time: 10/19/15  2:42 PM  Result  Value Ref Range   Sodium 134 (L) 135 - 145 mmol/L   Potassium 4.5 3.5 - 5.1 mmol/L   Chloride 117 (H) 101 - 111 mmol/L   CO2 10 (L) 22 - 32 mmol/L   Glucose, Bld 102 (H) 65 - 99 mg/dL   BUN 34 (H) 6 - 20 mg/dL   Creatinine, Ser 2.08 (H) 0.44 - 1.00 mg/dL   Calcium 8.0 (L) 8.9 - 10.3 mg/dL   Total Protein 5.9 (L) 6.5 - 8.1 g/dL   Albumin 2.0 (L) 3.5 - 5.0 g/dL   AST 54 (H) 15 - 41 U/L   ALT 24 14 - 54 U/L   Alkaline Phosphatase 146 (H) 38 - 126 U/L   Total Bilirubin 1.6 (H) 0.3 - 1.2 mg/dL   GFR calc non Af Amer 25 (L) >60 mL/min   GFR calc Af Amer 29 (L) >60 mL/min    Comment: (NOTE) The eGFR has been calculated using the CKD EPI equation. This calculation has not been validated in all clinical situations. eGFR's persistently <60 mL/min signify possible Chronic Kidney Disease.    Anion gap 7 5 - 15  Troponin I     Status: Abnormal   Collection Time: 10/19/15  2:42 PM  Result  Value Ref Range   Troponin I 0.14 (H) <0.031 ng/mL    Comment:        PERSISTENTLY INCREASED TROPONIN VALUES IN THE RANGE OF 0.04-0.49 ng/mL CAN BE SEEN IN:       -UNSTABLE ANGINA       -CONGESTIVE HEART FAILURE       -MYOCARDITIS       -CHEST TRAUMA       -ARRYHTHMIAS       -LATE PRESENTING MYOCARDIAL INFARCTION       -COPD   CLINICAL FOLLOW-UP RECOMMENDED.   Ammonia     Status: Abnormal   Collection Time: 10/19/15  2:43 PM  Result Value Ref Range   Ammonia 206 (H) 9 - 35 umol/L  Urinalysis, Routine w reflex microscopic (not at [facility])     Status: Abnormal   Collection Time: 10/19/15  4:56 PM  Result Value Ref Range   Color, Urine YELLOW YELLOW   APPearance CLEAR CLEAR   Specific Gravity, Urine 1.010 1.005 - 1.030   pH 5.5 5.0 - 8.0   Glucose, UA NEGATIVE NEGATIVE mg/dL   Hgb urine dipstick SMALL (A) NEGATIVE   Bilirubin Urine NEGATIVE NEGATIVE   Ketones, ur NEGATIVE NEGATIVE mg/dL   Protein, ur TRACE (A) NEGATIVE mg/dL   Nitrite NEGATIVE NEGATIVE   Leukocytes, UA NEGATIVE NEGATIVE  Urine  microscopic-add on     Status: Abnormal   Collection Time: 10/19/15  4:56 PM  Result Value Ref Range   Squamous Epithelial / LPF 6-30 (A) NONE SEEN   WBC, UA 0-5 0 - 5 WBC/hpf   RBC / HPF 0-5 0 - 5 RBC/hpf   Bacteria, UA FEW (A) NONE SEEN   Casts HYALINE CASTS (A) NEGATIVE  Troponin I     Status: Abnormal   Collection Time: 10/19/15  9:17 PM  Result Value Ref Range   Troponin I 0.12 (H) <0.031 ng/mL    Comment:        PERSISTENTLY INCREASED TROPONIN VALUES IN THE RANGE OF 0.04-0.49 ng/mL CAN BE SEEN IN:       -UNSTABLE ANGINA       -CONGESTIVE HEART FAILURE       -MYOCARDITIS       -CHEST TRAUMA       -ARRYHTHMIAS       -LATE PRESENTING MYOCARDIAL INFARCTION       -COPD   CLINICAL FOLLOW-UP RECOMMENDED.   Culture, blood (routine x 2)     Status: None (Preliminary result)   Collection Time: 10/19/15  9:17 PM  Result Value Ref Range   Specimen Description BLOOD RIGHT ARM    Special Requests      BOTTLES DRAWN AEROBIC AND ANAEROBIC AEB 8CC ANA [place]   Culture PENDING    Report Status PENDING   Culture, blood (routine x 2)     Status: None (Preliminary result)   Collection Time: 10/19/15  9:21 PM  Result Value Ref Range   Specimen Description BLOOD RIGHT HAND    Special Requests      BOTTLES DRAWN AEROBIC AND ANAEROBIC AEB 5CC ANA 4CC   Culture PENDING    Report Status PENDING   Comprehensive metabolic panel     Status: Abnormal   Collection Time: 10/20/15  2:15 AM  Result Value Ref Range   Sodium 143 135 - 145 mmol/L    Comment: DELTA CHECK NOTED   Potassium 4.0 3.5 - 5.1 mmol/L   Chloride 126 (H) 101 - 111 mmol/L  CO2 11 (L) 22 - 32 mmol/L   Glucose, Bld 113 (H) 65 - 99 mg/dL   BUN 33 (H) 6 - 20 mg/dL   Creatinine, Ser 1.73 (H) 0.44 - 1.00 mg/dL   Calcium 8.1 (L) 8.9 - 10.3 mg/dL   Total Protein 4.5 (L) 6.5 - 8.1 g/dL   Albumin 1.6 (L) 3.5 - 5.0 g/dL   AST 39 15 - 41 U/L   ALT 18 14 - 54 U/L   Alkaline Phosphatase 105 38 - 126 U/L   Total Bilirubin 1.0 0.3 - 1.2  mg/dL   GFR calc non Af Amer 31 (L) >60 mL/min   GFR calc Af Amer 36 (L) >60 mL/min    Comment: (NOTE) The eGFR has been calculated using the CKD EPI equation. This calculation has not been validated in all clinical situations. eGFR's persistently <60 mL/min signify possible Chronic Kidney Disease.    Anion gap 6 5 - 15  CBC     Status: Abnormal   Collection Time: 10/20/15  2:15 AM  Result Value Ref Range   WBC 12.6 (H) 4.0 - 10.5 K/uL   RBC 3.24 (L) 3.87 - 5.11 MIL/uL   Hemoglobin 8.8 (L) 12.0 - 15.0 g/dL   HCT 25.7 (L) 36.0 - 46.0 %   MCV 79.3 78.0 - 100.0 fL   MCH 27.2 26.0 - 34.0 pg   MCHC 34.2 30.0 - 36.0 g/dL   RDW 19.6 (H) 11.5 - 15.5 %   Platelets 161 150 - 400 K/uL  Ammonia     Status: None   Collection Time: 10/20/15  2:15 AM  Result Value Ref Range   Ammonia 29 9 - 35 umol/L  Troponin I     Status: Abnormal   Collection Time: 10/20/15  2:15 AM  Result Value Ref Range   Troponin I 0.11 (H) <0.031 ng/mL    Comment:        PERSISTENTLY INCREASED TROPONIN VALUES IN THE RANGE OF 0.04-0.49 ng/mL CAN BE SEEN IN:       -UNSTABLE ANGINA       -CONGESTIVE HEART FAILURE       -MYOCARDITIS       -CHEST TRAUMA       -ARRYHTHMIAS       -LATE PRESENTING MYOCARDIAL INFARCTION       -COPD   CLINICAL FOLLOW-UP RECOMMENDED.     ABGS No results for input(s): PHART, PO2ART, TCO2, HCO3 in the last 72 hours.  Invalid input(s): PCO2 CULTURES Recent Results (from the past 240 hour(s))  Culture, blood (routine x 2)     Status: None (Preliminary result)   Collection Time: 10/19/15  9:17 PM  Result Value Ref Range Status   Specimen Description BLOOD RIGHT ARM  Final   Special Requests   Final    BOTTLES DRAWN AEROBIC AND ANAEROBIC AEB 8CC ANA [employer]   Culture PENDING  Incomplete   Report Status PENDING  Incomplete  Culture, blood (routine x 2)     Status: None (Preliminary result)   Collection Time: 10/19/15  9:21 PM  Result Value Ref Range Status   Specimen Description  BLOOD RIGHT HAND  Final   Special Requests   Final    BOTTLES DRAWN AEROBIC AND ANAEROBIC AEB 5CC ANA 4CC   Culture PENDING  Incomplete   Report Status PENDING  Incomplete   Studies/Results: Dg Chest Portable 1 View  10/19/2015  CLINICAL DATA:  Altered mental status, combative, abdominal pain, history breast cancer, hypertension, cirrhosis,  ethanol abuse EXAM: PORTABLE CHEST 1 VIEW COMPARISON:  Portable exam 1324 hours compared to 09/24/2015 FINDINGS: Upper normal heart size. Mediastinal contours and pulmonary vascularity normal. Slight rotation to the RIGHT. Lungs clear. No pleural effusion or pneumothorax. Bones demineralized. IMPRESSION: No acute abnormalities. Electronically Signed   By: Lavonia Dana M.D.   On: 10/19/2015 18:39    Medications: I have reviewed the patient's current medications.  Assesment:   Active Problems:   Alcoholic cirrhosis of liver with ascites (HCC)   Hepatic encephalopathy (HCC)   Elevated troponin   Acute renal failure (ARF) (HCC)   Metabolic acidosis    Plan:  Medications reviewed Will continue lactulose Continue IV antibiotic for now GI consult Will start on clear liquid diet    LOS: 1 day   Tarin Navarez 10/20/2015, 8:24 AM

## 2015-10-20 NOTE — Progress Notes (Addendum)
Pt remains drowsy, but is more coherant than the beginning of this shift. flexiseal remains in place for incontinnence control and to administer lactulose enemas to lower ammonia level. daugther and son-in-law in to see pt. Pt has clear liquid diet is ordered,but pt is too drowsy to want any more than sips.

## 2015-10-20 NOTE — Patient Outreach (Signed)
10/20/15- Pt admitted 10/19/15 with elevated ammonia level, RN CM sent notification to Burgess Amor RN, [facility] hospital liason.  Jacqlyn Larsen [facility], [place] Coordinator [phone]

## 2015-10-21 LAB — BASIC METABOLIC PANEL
ANION GAP: 5 (ref 5–15)
BUN: 26 mg/dL — ABNORMAL HIGH (ref 6–20)
CO2: 23 mmol/L (ref 22–32)
Calcium: 7 mg/dL — ABNORMAL LOW (ref 8.9–10.3)
Chloride: 114 mmol/L — ABNORMAL HIGH (ref 101–111)
Creatinine, Ser: 1.32 mg/dL — ABNORMAL HIGH (ref 0.44–1.00)
GFR calc Af Amer: 50 mL/min — ABNORMAL LOW (ref >60)
GFR, EST NON AFRICAN AMERICAN: 43 mL/min — AB (ref >60)
GLUCOSE: 96 mg/dL (ref 65–99)
POTASSIUM: 3.1 mmol/L — AB (ref 3.5–5.1)
Sodium: 142 mmol/L (ref 135–145)

## 2015-10-21 LAB — CBC
HEMATOCRIT: 25.5 % — AB (ref 36.0–46.0)
HEMOGLOBIN: 9.1 g/dL — AB (ref 12.0–15.0)
MCH: 27.6 pg (ref 26.0–34.0)
MCHC: 35.7 g/dL (ref 30.0–36.0)
MCV: 77.3 fL — AB (ref 78.0–100.0)
PLATELETS: 177 10*3/uL (ref 150–400)
RBC: 3.3 MIL/uL — AB (ref 3.87–5.11)
RDW: 18.6 % — ABNORMAL HIGH (ref 11.5–15.5)
WBC: 8.6 10*3/uL (ref 4.0–10.5)

## 2015-10-21 LAB — TROPONIN I: Troponin I: 0.12 ng/mL — ABNORMAL HIGH (ref <0.031)

## 2015-10-21 MED ORDER — LACTULOSE 10 GM/15ML PO SOLN
20.0000 g | Freq: Three times a day (TID) | ORAL | Status: DC
  Administered 2015-10-21 (×3): 20 g via ORAL
  Filled 2015-10-21 (×3): qty 30

## 2015-10-21 MED ORDER — POTASSIUM CHLORIDE CRYS ER 20 MEQ PO TBCR
40.0000 meq | EXTENDED_RELEASE_TABLET | Freq: Three times a day (TID) | ORAL | Status: AC
Start: 2015-10-21 — End: 2015-10-21
  Administered 2015-10-21 (×3): 40 meq via ORAL
  Filled 2015-10-21 (×3): qty 2

## 2015-10-21 NOTE — Care Management Note (Signed)
Case Management Note  Patient Details  Name: Savannah [facility] MRN: [id number] Date of Birth: 06/04/1956  Subjective/Objective:                  Pt admitted for hepatic encephelopathy. Pt lives at home with her husband who is disabled and for whom she is the primary caregiver. Pt is ind with ADL's, no HH services or DME's prior to admission. Pt states she recently was approved for financial assistance for her xeralto. Pt plans to return home with self care at [place].   Action/Plan: No CM needs.   Expected Discharge Date:   10/23/2015               Expected Discharge Plan:  Home/Self Care  In-House Referral:  NA  Discharge planning Services  CM Consult  Post Acute Care Choice:  NA Choice offered to:  NA  DME Arranged:    DME Agency:     HH Arranged:    HH Agency:     Status of Service:  In process, will continue to follow  Medicare Important Message Given:    Date Medicare IM Given:    Medicare IM give by:    Date Additional Medicare IM Given:    Additional Medicare Important Message give by:     If discussed at [place] of Stay Meetings, dates discussed:    Additional Comments:  Sherald Barge, RN 10/21/2015, 2:45 PM

## 2015-10-21 NOTE — Progress Notes (Signed)
Subjective: Patient is more alert and awake and tolerating clear liquid diet.  Objective: Vital signs in last 24 hours: Temp:  [96.7 F (35.9 C)-98.3 F (36.8 C)] 97 F (36.1 C) (12/22 0813) Pulse Rate:  [62-91] 75 (12/22 0700) Resp:  [12-22] 18 (12/22 0700) BP: (83-180)/(52-158) 122/82 mmHg (12/22 0700) SpO2:  [96 %-100 %] 99 % (12/22 0700) Weight:  [66 kg (145 lb 8.1 oz)] 66 kg (145 lb 8.1 oz) (12/22 0500) Weight change: 0.228 kg (8.1 oz) Last BM Date: 10/20/15 (pt receiving lactulose enemas tid)  Intake/Output from previous day: 12/21 0701 - 12/22 0700 In: 3840 [P.O.:840; I.V.:3000] Out: 2350 [Urine:950; Stool:1400]  PHYSICAL EXAM General appearance: no distress and slowed mentation Resp: clear to auscultation bilaterally Cardio: S1, S2 normal GI: soft, non-tender; bowel sounds normal; no masses,  no organomegaly Extremities: extremities normal, atraumatic, no cyanosis or edema  Lab Results:  Results for orders placed or performed during the hospital encounter of 10/19/15 (from the past 48 hour(s))  CBC with Differential     Status: Abnormal   Collection Time: 10/19/15  2:42 PM  Result Value Ref Range   WBC 21.7 (H) 4.0 - 10.5 K/uL   RBC 3.75 (L) 3.87 - 5.11 MIL/uL   Hemoglobin 10.1 (L) 12.0 - 15.0 g/dL   HCT 30.0 (L) 36.0 - 46.0 %   MCV 80.0 78.0 - 100.0 fL   MCH 26.9 26.0 - 34.0 pg   MCHC 33.7 30.0 - 36.0 g/dL   RDW 19.4 (H) 11.5 - 15.5 %   Platelets 175 150 - 400 K/uL   Neutrophils Relative % 75 %   Neutro Abs 16.4 (H) 1.7 - 7.7 K/uL   Lymphocytes Relative 13 %   Lymphs Abs 2.8 0.7 - 4.0 K/uL   Monocytes Relative 8 %   Monocytes Absolute 1.8 (H) 0.1 - 1.0 K/uL   Eosinophils Relative 3 %   Eosinophils Absolute 0.5 0.0 - 0.7 K/uL   Basophils Relative 1 %   Basophils Absolute 0.2 (H) 0.0 - 0.1 K/uL  Comprehensive metabolic panel     Status: Abnormal   Collection Time: 10/19/15  2:42 PM  Result Value Ref Range   Sodium 134 (L) 135 - 145 mmol/L   Potassium  4.5 3.5 - 5.1 mmol/L   Chloride 117 (H) 101 - 111 mmol/L   CO2 10 (L) 22 - 32 mmol/L   Glucose, Bld 102 (H) 65 - 99 mg/dL   BUN 34 (H) 6 - 20 mg/dL   Creatinine, Ser 2.08 (H) 0.44 - 1.00 mg/dL   Calcium 8.0 (L) 8.9 - 10.3 mg/dL   Total Protein 5.9 (L) 6.5 - 8.1 g/dL   Albumin 2.0 (L) 3.5 - 5.0 g/dL   AST 54 (H) 15 - 41 U/L   ALT 24 14 - 54 U/L   Alkaline Phosphatase 146 (H) 38 - 126 U/L   Total Bilirubin 1.6 (H) 0.3 - 1.2 mg/dL   GFR calc non Af Amer 25 (L) >60 mL/min   GFR calc Af Amer 29 (L) >60 mL/min    Comment: (NOTE) The eGFR has been calculated using the CKD EPI equation. This calculation has not been validated in all clinical situations. eGFR's persistently <60 mL/min signify possible Chronic Kidney Disease.    Anion gap 7 5 - 15  Troponin I     Status: Abnormal   Collection Time: 10/19/15  2:42 PM  Result Value Ref Range   Troponin I 0.14 (H) <0.031 ng/mL  Comment:        PERSISTENTLY INCREASED TROPONIN VALUES IN THE RANGE OF 0.04-0.49 ng/mL CAN BE SEEN IN:       -UNSTABLE ANGINA       -CONGESTIVE HEART FAILURE       -MYOCARDITIS       -CHEST TRAUMA       -ARRYHTHMIAS       -LATE PRESENTING MYOCARDIAL INFARCTION       -COPD   CLINICAL FOLLOW-UP RECOMMENDED.   Ammonia     Status: Abnormal   Collection Time: 10/19/15  2:43 PM  Result Value Ref Range   Ammonia 206 (H) 9 - 35 umol/L  Urinalysis, Routine w reflex microscopic (not at [facility])     Status: Abnormal   Collection Time: 10/19/15  4:56 PM  Result Value Ref Range   Color, Urine YELLOW YELLOW   APPearance CLEAR CLEAR   Specific Gravity, Urine 1.010 1.005 - 1.030   pH 5.5 5.0 - 8.0   Glucose, UA NEGATIVE NEGATIVE mg/dL   Hgb urine dipstick SMALL (A) NEGATIVE   Bilirubin Urine NEGATIVE NEGATIVE   Ketones, ur NEGATIVE NEGATIVE mg/dL   Protein, ur TRACE (A) NEGATIVE mg/dL   Nitrite NEGATIVE NEGATIVE   Leukocytes, UA NEGATIVE NEGATIVE  Urine culture     Status: None (Preliminary result)   Collection  Time: 10/19/15  4:56 PM  Result Value Ref Range   Specimen Description URINE, CLEAN CATCH    Special Requests NONE    Culture      TOO YOUNG TO READ Performed at [facility]    Report Status PENDING   Urine microscopic-add on     Status: Abnormal   Collection Time: 10/19/15  4:56 PM  Result Value Ref Range   Squamous Epithelial / LPF 6-30 (A) NONE SEEN   WBC, UA 0-5 0 - 5 WBC/hpf   RBC / HPF 0-5 0 - 5 RBC/hpf   Bacteria, UA FEW (A) NONE SEEN   Casts HYALINE CASTS (A) NEGATIVE  Troponin I     Status: Abnormal   Collection Time: 10/19/15  9:17 PM  Result Value Ref Range   Troponin I 0.12 (H) <0.031 ng/mL    Comment:        PERSISTENTLY INCREASED TROPONIN VALUES IN THE RANGE OF 0.04-0.49 ng/mL CAN BE SEEN IN:       -UNSTABLE ANGINA       -CONGESTIVE HEART FAILURE       -MYOCARDITIS       -CHEST TRAUMA       -ARRYHTHMIAS       -LATE PRESENTING MYOCARDIAL INFARCTION       -COPD   CLINICAL FOLLOW-UP RECOMMENDED.   Culture, blood (routine x 2)     Status: None (Preliminary result)   Collection Time: 10/19/15  9:17 PM  Result Value Ref Range   Specimen Description BLOOD RIGHT ARM    Special Requests      BOTTLES DRAWN AEROBIC AND ANAEROBIC AEB 8CC ANA [place]   Culture NO GROWTH < 12 HOURS    Report Status PENDING   Culture, blood (routine x 2)     Status: None (Preliminary result)   Collection Time: 10/19/15  9:21 PM  Result Value Ref Range   Specimen Description BLOOD RIGHT HAND    Special Requests      BOTTLES DRAWN AEROBIC AND ANAEROBIC AEB 5CC ANA 4CC   Culture NO GROWTH < 12 HOURS    Report Status PENDING  Comprehensive metabolic panel     Status: Abnormal   Collection Time: 10/20/15  2:15 AM  Result Value Ref Range   Sodium 143 135 - 145 mmol/L    Comment: DELTA CHECK NOTED   Potassium 4.0 3.5 - 5.1 mmol/L   Chloride 126 (H) 101 - 111 mmol/L   CO2 11 (L) 22 - 32 mmol/L   Glucose, Bld 113 (H) 65 - 99 mg/dL   BUN 33 (H) 6 - 20 mg/dL   Creatinine, Ser 9.44  (H) 0.44 - 1.00 mg/dL   Calcium 8.1 (L) 8.9 - 10.3 mg/dL   Total Protein 4.5 (L) 6.5 - 8.1 g/dL   Albumin 1.6 (L) 3.5 - 5.0 g/dL   AST 39 15 - 41 U/L   ALT 18 14 - 54 U/L   Alkaline Phosphatase 105 38 - 126 U/L   Total Bilirubin 1.0 0.3 - 1.2 mg/dL   GFR calc non Af Amer 31 (L) >60 mL/min   GFR calc Af Amer 36 (L) >60 mL/min    Comment: (NOTE) The eGFR has been calculated using the CKD EPI equation. This calculation has not been validated in all clinical situations. eGFR's persistently <60 mL/min signify possible Chronic Kidney Disease.    Anion gap 6 5 - 15  CBC     Status: Abnormal   Collection Time: 10/20/15  2:15 AM  Result Value Ref Range   WBC 12.6 (H) 4.0 - 10.5 K/uL   RBC 3.24 (L) 3.87 - 5.11 MIL/uL   Hemoglobin 8.8 (L) 12.0 - 15.0 g/dL   HCT 73.9 (L) 58.4 - 41.7 %   MCV 79.3 78.0 - 100.0 fL   MCH 27.2 26.0 - 34.0 pg   MCHC 34.2 30.0 - 36.0 g/dL   RDW 12.7 (H) 87.1 - 83.6 %   Platelets 161 150 - 400 K/uL  Ammonia     Status: None   Collection Time: 10/20/15  2:15 AM  Result Value Ref Range   Ammonia 29 9 - 35 umol/L  Troponin I     Status: Abnormal   Collection Time: 10/20/15  2:15 AM  Result Value Ref Range   Troponin I 0.11 (H) <0.031 ng/mL    Comment:        PERSISTENTLY INCREASED TROPONIN VALUES IN THE RANGE OF 0.04-0.49 ng/mL CAN BE SEEN IN:       -UNSTABLE ANGINA       -CONGESTIVE HEART FAILURE       -MYOCARDITIS       -CHEST TRAUMA       -ARRYHTHMIAS       -LATE PRESENTING MYOCARDIAL INFARCTION       -COPD   CLINICAL FOLLOW-UP RECOMMENDED.   Troponin I     Status: Abnormal   Collection Time: 10/20/15  8:05 AM  Result Value Ref Range   Troponin I 0.12 (H) <0.031 ng/mL    Comment:        PERSISTENTLY INCREASED TROPONIN VALUES IN THE RANGE OF 0.04-0.49 ng/mL CAN BE SEEN IN:       -UNSTABLE ANGINA       -CONGESTIVE HEART FAILURE       -MYOCARDITIS       -CHEST TRAUMA       -ARRYHTHMIAS       -LATE PRESENTING MYOCARDIAL INFARCTION        -COPD   CLINICAL FOLLOW-UP RECOMMENDED.   CBC     Status: Abnormal   Collection Time: 10/21/15  5:11 AM  Result Value  Ref Range   WBC 8.6 4.0 - 10.5 K/uL   RBC 3.30 (L) 3.87 - 5.11 MIL/uL   Hemoglobin 9.1 (L) 12.0 - 15.0 g/dL   HCT 64.1 (L) 33.0 - 75.5 %   MCV 77.3 (L) 78.0 - 100.0 fL   MCH 27.6 26.0 - 34.0 pg   MCHC 35.7 30.0 - 36.0 g/dL   RDW 27.9 (H) 23.3 - 41.6 %   Platelets 177 150 - 400 K/uL  Basic metabolic panel     Status: Abnormal   Collection Time: 10/21/15  5:11 AM  Result Value Ref Range   Sodium 142 135 - 145 mmol/L   Potassium 3.1 (L) 3.5 - 5.1 mmol/L    Comment: DELTA CHECK NOTED   Chloride 114 (H) 101 - 111 mmol/L   CO2 23 22 - 32 mmol/L   Glucose, Bld 96 65 - 99 mg/dL   BUN 26 (H) 6 - 20 mg/dL   Creatinine, Ser 2.25 (H) 0.44 - 1.00 mg/dL   Calcium 7.0 (L) 8.9 - 10.3 mg/dL   GFR calc non Af Amer 43 (L) >60 mL/min   GFR calc Af Amer 50 (L) >60 mL/min    Comment: (NOTE) The eGFR has been calculated using the CKD EPI equation. This calculation has not been validated in all clinical situations. eGFR's persistently <60 mL/min signify possible Chronic Kidney Disease.    Anion gap 5 5 - 15  Troponin I     Status: Abnormal   Collection Time: 10/21/15  5:11 AM  Result Value Ref Range   Troponin I 0.12 (H) <0.031 ng/mL    Comment:        PERSISTENTLY INCREASED TROPONIN VALUES IN THE RANGE OF 0.04-0.49 ng/mL CAN BE SEEN IN:       -UNSTABLE ANGINA       -CONGESTIVE HEART FAILURE       -MYOCARDITIS       -CHEST TRAUMA       -ARRYHTHMIAS       -LATE PRESENTING MYOCARDIAL INFARCTION       -COPD   CLINICAL FOLLOW-UP RECOMMENDED.     ABGS No results for input(s): PHART, PO2ART, TCO2, HCO3 in the last 72 hours.  Invalid input(s): PCO2 CULTURES Recent Results (from the past 240 hour(s))  Urine culture     Status: None (Preliminary result)   Collection Time: 10/19/15  4:56 PM  Result Value Ref Range Status   Specimen Description URINE, CLEAN CATCH  Final    Special Requests NONE  Final   Culture   Final    TOO YOUNG TO READ Performed at [facility]    Report Status PENDING  Incomplete  Culture, blood (routine x 2)     Status: None (Preliminary result)   Collection Time: 10/19/15  9:17 PM  Result Value Ref Range Status   Specimen Description BLOOD RIGHT ARM  Final   Special Requests   Final    BOTTLES DRAWN AEROBIC AND ANAEROBIC AEB 8CC ANA 6CC   Culture NO GROWTH < 12 HOURS  Final   Report Status PENDING  Incomplete  Culture, blood (routine x 2)     Status: None (Preliminary result)   Collection Time: 10/19/15  9:21 PM  Result Value Ref Range Status   Specimen Description BLOOD RIGHT HAND  Final   Special Requests   Final    BOTTLES DRAWN AEROBIC AND ANAEROBIC AEB 5CC ANA 4CC   Culture NO GROWTH < 12 HOURS  Final   Report  Status PENDING  Incomplete   Studies/Results: Dg Chest Portable 1 View  10/19/2015  CLINICAL DATA:  Altered mental status, combative, abdominal pain, history breast cancer, hypertension, cirrhosis, ethanol abuse EXAM: PORTABLE CHEST 1 VIEW COMPARISON:  Portable exam 1827 hours compared to 09/24/2015 FINDINGS: Upper normal heart size. Mediastinal contours and pulmonary vascularity normal. Slight rotation to the RIGHT. Lungs clear. No pleural effusion or pneumothorax. Bones demineralized. IMPRESSION: No acute abnormalities. Electronically Signed   By: Lavonia Dana M.D.   On: 10/19/2015 18:39    Medications: I have reviewed the patient's current medications.  Assesment:   Active Problems:   Alcoholic cirrhosis of liver with ascites (HCC)   Hepatic encephalopathy (HCC)   Elevated troponin   Acute renal failure (ARF) (HCC)   Metabolic acidosis    Plan:  Medications reviewed Will change lactulose to po Continue IV antibiotic for now GI consult Advise diet a tlerated    LOS: 2 days   Tommi Crepeau 10/21/2015, 8:34 AM

## 2015-10-21 NOTE — Consult Note (Signed)
   [facility] Consult   10/21/2015  Savannah Jackson 27-Jul-1956 [id number]   Patient is currently active with [facility] for chronic disease management services.  Patient has been engaged by a Engineer, water. Pharmacy assistance for [facility].  Our community based plan of care has focused on disease management and community resource support.  Patient will receive a post discharge transition of care call and will be evaluated for monthly home visits for assessments and disease process education.  Made Inpatient Case Manager aware that [place] Management following. Of note, [facility] Care Management services does not replace or interfere with any services that are arranged by inpatient case management or social work.   For additional questions or referrals please contact:  [facility]. Savannah Purser, RN, BSN, [facility] Liaison [phone]

## 2015-10-22 LAB — BASIC METABOLIC PANEL
BUN: 27 mg/dL — ABNORMAL HIGH (ref 6–20)
CO2: 22 mmol/L (ref 22–32)
Calcium: 7.3 mg/dL — ABNORMAL LOW (ref 8.9–10.3)
Chloride: 115 mmol/L — ABNORMAL HIGH (ref 101–111)
Creatinine, Ser: 1.71 mg/dL — ABNORMAL HIGH (ref 0.44–1.00)
GFR calc non Af Amer: 31 mL/min — ABNORMAL LOW (ref >60)
GFR, EST AFRICAN AMERICAN: 36 mL/min — AB (ref >60)
GLUCOSE: 97 mg/dL (ref 65–99)
POTASSIUM: 4.6 mmol/L (ref 3.5–5.1)
Sodium: 139 mmol/L (ref 135–145)

## 2015-10-22 NOTE — Care Management Note (Signed)
Case Management Note  Patient Details  Name: Savannah Jackson MRN: [id number] Date of Birth: Apr 05, 1956   Expected Discharge Date:     10/22/2015             Expected Discharge Plan:  Home/Self Care  In-House Referral:  NA  Discharge planning Services  CM Consult  Post Acute Care Choice:  NA Choice offered to:  NA  DME Arranged:    DME Agency:     HH Arranged:    [place] Agency:     Status of Service:  Completed, signed off  Medicare Important Message Given:  Yes Date Medicare IM Given:    Medicare IM give by:    Date Additional Medicare IM Given:    Additional Medicare Important Message give by:     If discussed at [place] of Stay Meetings, dates discussed:    Additional Comments: Pt discharging home today with self care. No HH or DME needed.   Sherald Barge, RN 10/22/2015, 9:16 AM

## 2015-10-22 NOTE — Progress Notes (Signed)
Discharge instructions reviewed with patient. Verbalized understanding. Pt dc'd to home with family. Schonewitz, Eulis Canner 10/22/2015

## 2015-10-22 NOTE — Care Management Important Message (Signed)
Important Message  Patient Details  Name: Savannah Jackson MRN: [id number] Date of Birth: 01/28/1956   Medicare Important Message Given:  Yes    Sherald Barge, RN 10/22/2015, 9:15 AM

## 2015-10-24 DIAGNOSIS — K7460 Unspecified cirrhosis of liver: Secondary | ICD-10-CM | POA: Diagnosis not present

## 2015-10-24 DIAGNOSIS — F1021 Alcohol dependence, in remission: Secondary | ICD-10-CM | POA: Diagnosis not present

## 2015-10-24 DIAGNOSIS — G9340 Encephalopathy, unspecified: Secondary | ICD-10-CM | POA: Diagnosis not present

## 2015-10-24 LAB — CULTURE, BLOOD (ROUTINE X 2)
CULTURE: NO GROWTH
Culture: NO GROWTH

## 2015-10-26 ENCOUNTER — Other Ambulatory Visit: Payer: Self-pay | Admitting: *Deleted

## 2015-10-26 ENCOUNTER — Encounter: Payer: Self-pay | Admitting: *Deleted

## 2015-10-26 NOTE — Patient Outreach (Signed)
10/26/15-Pt discharged from hospital 10/22/15, telephone call to pt for transition of care week 1, spoke with pt, HIPAA verified, pt states she is planning on filling nadolol and will see her primary care MD within 2 weeks, pt did not seem interested in talking on the phone today.  RN CM faxed today's transition of care note to primary MD Dr. Legrand Rams, faxed letter also.  Outpatient Encounter Prescriptions as of 10/26/2015  Medication Sig  . folic acid (FOLVITE) 1 MG tablet Take 1 tablet (1 mg total) by mouth daily.  . furosemide (LASIX) 40 MG tablet Take 20 mg by mouth daily.   . hydrOXYzine (ATARAX/VISTARIL) 25 MG tablet Take 25 mg by mouth every 6 (six) hours as needed.  . lactulose (CHRONULAC) 10 GM/15ML solution Take 30 mLs (20 g total) by mouth 3 (three) times daily. Four times a day  . ondansetron (ZOFRAN) 4 MG tablet Take 1 tablet (4 mg total) by mouth every 8 (eight) hours as needed for nausea or vomiting.  . rifaximin (XIFAXAN) 550 MG TABS tablet Take 550 mg by mouth 2 (two) times daily.  Marland Kitchen spironolactone (ALDACTONE) 100 MG tablet Take 1 tablet (100 mg total) by mouth daily.  Marland Kitchen thiamine 100 MG tablet Take 1 tablet (100 mg total) by mouth daily.   No facility-administered encounter medications on file as of 10/26/2015.    [facility] CM Care Plan Problem One        Most Recent Value   Care Plan Problem One  knowledge deficit related to disease process Cirrhosis   Role Documenting the Problem One  Care Management Coordinator   Care Plan for Problem One  Active   [facility] Long Term Goal (31-90 days)  pt will have no hospital readmissions within 90 days   [facility] Long Term Goal Start Date  10/26/15 Barrie Folk restarted- pt hospitalized]   Interventions for Problem One Long Term Goal  RN CM reviewed medications with pt and importance of taking all medications as prescribed, emphasis placed on importance of lactulose.   [facility] CM Short Term Goal #1 Start Date  09/10/15 [goal restarted]   [facility] CM Short Term Goal #1  Met Date  09/16/15 [pt saw MD 09/13/15]    [facility] CM Care Plan Problem Two        Most Recent Value   [facility] CM Short Term Goal #1 Start Date  09/17/15      PLAN Follow up with home visit next week 11/04/14 Continue weekly transition of care outreach  Jacqlyn Larsen [facility], [place] Coordinator [phone]

## 2015-10-28 LAB — URINE CULTURE: Culture: >=100000

## 2015-11-02 ENCOUNTER — Other Ambulatory Visit: Payer: Self-pay

## 2015-11-02 NOTE — Patient Outreach (Signed)
I called Ms. Germany to follow up on her medications and to determine how she was doing.  Based on her medications list, it looks like her nadolol was stopped while she was in the hospital.  I had to leave a HIPPA compliant message for her to call me back.  If I do not hear back from her today, I will call her back at a later date.    Deanne Coffer, PharmD, [place] [phone]

## 2015-11-04 ENCOUNTER — Telehealth (INDEPENDENT_AMBULATORY_CARE_PROVIDER_SITE_OTHER): Payer: Self-pay | Admitting: Internal Medicine

## 2015-11-04 NOTE — Telephone Encounter (Signed)
Mr. Betro called saying the pt has bilat leg swelling again and her stomach is retaining fluid. She wants him to take her to the ER. He said "if it is what it was the last time, being a double amputee, I can't handle her." He'd like a phone call regarding this asap if possible. The pt is "scared to death" per her husband.  Savannah Jackson ph# [phone] Thank you.

## 2015-11-04 NOTE — Telephone Encounter (Signed)
Talked with the patient's husband. He states that her legs have been really swollen this week , and now her stomach. He says that she has that look about her she gets just before she gets confused. Patient is fearful of coming to the ED , feels that it may be her last time. He can call his daughter to bring her to the ED if needed.  Dr.Rehman has been contacted.

## 2015-11-04 NOTE — Telephone Encounter (Signed)
Talked with [place]. She states that her legs are real tight , shiny. Her stomach is beginning to get big.  She was advised that Dr.Rehman wanted her to increase her Lasix to 40 mg until she had OV next week with him. I ask that she call back with a progress report.

## 2015-11-05 ENCOUNTER — Ambulatory Visit: Payer: Self-pay | Admitting: *Deleted

## 2015-11-05 ENCOUNTER — Other Ambulatory Visit: Payer: Self-pay | Admitting: *Deleted

## 2015-11-05 DIAGNOSIS — I10 Essential (primary) hypertension: Secondary | ICD-10-CM | POA: Diagnosis not present

## 2015-11-05 DIAGNOSIS — F1020 Alcohol dependence, uncomplicated: Secondary | ICD-10-CM | POA: Diagnosis not present

## 2015-11-05 DIAGNOSIS — K769 Liver disease, unspecified: Secondary | ICD-10-CM | POA: Diagnosis not present

## 2015-11-05 DIAGNOSIS — K5732 Diverticulitis of large intestine without perforation or abscess without bleeding: Secondary | ICD-10-CM | POA: Diagnosis not present

## 2015-11-05 DIAGNOSIS — R188 Other ascites: Secondary | ICD-10-CM | POA: Diagnosis not present

## 2015-11-05 DIAGNOSIS — D684 Acquired coagulation factor deficiency: Secondary | ICD-10-CM | POA: Diagnosis not present

## 2015-11-05 DIAGNOSIS — J90 Pleural effusion, not elsewhere classified: Secondary | ICD-10-CM | POA: Diagnosis not present

## 2015-11-05 DIAGNOSIS — K7290 Hepatic failure, unspecified without coma: Secondary | ICD-10-CM | POA: Diagnosis not present

## 2015-11-05 DIAGNOSIS — K7030 Alcoholic cirrhosis of liver without ascites: Secondary | ICD-10-CM | POA: Diagnosis not present

## 2015-11-05 DIAGNOSIS — F339 Major depressive disorder, recurrent, unspecified: Secondary | ICD-10-CM | POA: Diagnosis not present

## 2015-11-05 DIAGNOSIS — R601 Generalized edema: Secondary | ICD-10-CM | POA: Diagnosis not present

## 2015-11-05 NOTE — Patient Outreach (Signed)
11/05/15-Telephone call to remind pt of scheduled home visit today/ transition of care week 2, spoke with pt, HIPAA verified, pt states " I just got back from my primary doctor and I'm tired, I don't see the point of you coming out when the doctor just saw me"  Pt refuses home visit today.  Pt agreeable to continue weekly transition of care calls and will decide at next week's telephone call if she wants another home visit, pt states she has all medications including lactulose and taking as prescribed. Pt states " no changes made at doctor visit today"  RN CM focused on action plan and importance of calling early for change in health status or symptoms,( contacing MD or MD on call, 24 hour nurse line, RN CM). RN CM reviewed Dr. Rehman's note in epic as pt had called MD yesterday 11/04/15 reporting swelling in lower extremities and abdomen and MD instructed pt to take 2 lasix daily until she sees him in the office next week on 11/09/15, pt states she is taking 2 lasix daily, states she now has xifaxin and taking as prescribed, pt denies any confusion, states " my swelling is about the same, hopefully the fluid pills will help"  Pt states primary MD did not make any changes, no new orders related to swelling.  RN CM sent In Basket to Dawn Pettus [facility] pharmacist reporting pt states she has xifaxin and other medications and taking as prescribed.   [facility] CM Care Plan Problem One        Most Recent Value   Care Plan Problem One  knowledge deficit related to disease process Cirrhosis   Role Documenting the Problem One  Care Management Coordinator   Care Plan for Problem One  Active   [facility] Long Term Goal (31-90 days)  pt will have no hospital readmissions within 90 days   [facility] Long Term Goal Start Date  10/26/15 [goal restarted- pt hospitalized]   Interventions for Problem One Long Term Goal  RN CM reviewed importance of calling early for change in health status, symptoms, instructed to take medications as prescribed with  emphasis on lactulose.   [facility] CM Short Term Goal #1 Start Date  09/10/15 [goal restarted]   [facility] CM Short Term Goal #1 Met Date  09/16/15 [pt saw MD 09/13/15]    [facility] CM Care Plan Problem Two        Most Recent Value   [facility] CM Short Term Goal #1 Start Date  09/17/15      PLAN Continue weekly transition of care calls Attempt to schedule home visit at next telephone outreach    RNC, BSN [facility] Community Care Coordinator [phone]   

## 2015-11-05 NOTE — Telephone Encounter (Signed)
OV sch'd for 11/09/15, Clarice will call patient

## 2015-11-05 NOTE — Telephone Encounter (Signed)
I left a message on the pt's voice mail. Thank you.

## 2015-11-07 ENCOUNTER — Telehealth: Payer: Self-pay | Admitting: Internal Medicine

## 2015-11-07 NOTE — Telephone Encounter (Signed)
Patient's daughter called me yesterday. Concerned patient had become confused and encephalopathic once again. No apparent bleeding or fever. Has been on a stable dose of Xifaxan and lactulose. Daughter states is possible she may have missed a couple doses of lactulose as the pharmacy was running low and  Lactulose may have been used sparingly at home recently. I recommended that the patient receive 30 mL of lactulose immediately and ensure that she gets back on her regular regimen. She reportedly has a follow-up appointment with Dr. Laural Golden on  January 11.  If mental status does not improve in the next 24 hours, she should be brought to the hospital.

## 2015-11-07 NOTE — Telephone Encounter (Signed)
Dr. Roseanne Kaufman note appreciated. Unfortunately TIPS has made problem worse.  Has OV next week.

## 2015-11-09 ENCOUNTER — Encounter (INDEPENDENT_AMBULATORY_CARE_PROVIDER_SITE_OTHER): Payer: Self-pay | Admitting: Internal Medicine

## 2015-11-09 ENCOUNTER — Telehealth (INDEPENDENT_AMBULATORY_CARE_PROVIDER_SITE_OTHER): Payer: Self-pay | Admitting: *Deleted

## 2015-11-09 ENCOUNTER — Encounter (HOSPITAL_COMMUNITY)
Admission: RE | Admit: 2015-11-09 | Discharge: 2015-11-09 | Disposition: A | Discharge status: Home / Self Care | Payer: Commercial Managed Care - HMO | Source: Ambulatory Visit | Attending: Internal Medicine | Admitting: Internal Medicine

## 2015-11-09 ENCOUNTER — Ambulatory Visit (INDEPENDENT_AMBULATORY_CARE_PROVIDER_SITE_OTHER): Payer: Commercial Managed Care - HMO | Admitting: Internal Medicine

## 2015-11-09 VITALS — BP 98/68 | HR 62 | Temp 97.8°F | Resp 18 | Ht 63.0 in | Wt 165.9 lb

## 2015-11-09 DIAGNOSIS — K7682 Hepatic encephalopathy: Secondary | ICD-10-CM

## 2015-11-09 DIAGNOSIS — K7290 Hepatic failure, unspecified without coma: Secondary | ICD-10-CM

## 2015-11-09 DIAGNOSIS — R7989 Other specified abnormal findings of blood chemistry: Secondary | ICD-10-CM

## 2015-11-09 DIAGNOSIS — E875 Hyperkalemia: Secondary | ICD-10-CM

## 2015-11-09 DIAGNOSIS — K7031 Alcoholic cirrhosis of liver with ascites: Secondary | ICD-10-CM

## 2015-11-09 LAB — PROTIME-INR
INR: 1.49 (ref 0.00–1.49)
PROTHROMBIN TIME: 18.1 s — AB (ref 11.6–15.2)

## 2015-11-09 LAB — BASIC METABOLIC PANEL
ANION GAP: 4 — AB (ref 5–15)
BUN: 28 mg/dL — ABNORMAL HIGH (ref 6–20)
CO2: 15 mmol/L — ABNORMAL LOW (ref 22–32)
Calcium: 8.1 mg/dL — ABNORMAL LOW (ref 8.9–10.3)
Chloride: 119 mmol/L — ABNORMAL HIGH (ref 101–111)
Creatinine, Ser: 1.82 mg/dL — ABNORMAL HIGH (ref 0.44–1.00)
GFR calc Af Amer: 34 mL/min — ABNORMAL LOW (ref >60)
GFR calc non Af Amer: 29 mL/min — ABNORMAL LOW (ref >60)
Glucose, Bld: 91 mg/dL (ref 65–99)
POTASSIUM: 6 mmol/L — AB (ref 3.5–5.1)
SODIUM: 138 mmol/L (ref 135–145)

## 2015-11-09 MED ORDER — ALBUMIN HUMAN 25 % IV SOLN
50.0000 g | INTRAVENOUS | Status: AC
  Administered 2015-11-09 (×4): 12.5 g via INTRAVENOUS
  Filled 2015-11-09: qty 200

## 2015-11-09 MED ORDER — SODIUM CHLORIDE 0.9 % IV SOLN
INTRAVENOUS | Status: DC
  Administered 2015-11-09: 200 mL via INTRAVENOUS

## 2015-11-09 NOTE — Progress Notes (Signed)
Presenting complaint;  Follow-up for decompensated liver disease. Patient confused over the weekend.  Subjective:  Patient is 60 year old African-American female who is here for scheduled visit. She was last seen on 09/20/2015. Since then she's been hospitalized for hepatic encephalopathy. Over the weekend she became confused. According to her daughter [name] she did not take her lactulose on schedule as she was afraid she may run out of prescription. She has not been confused since yesterday. She denies fever chills nausea vomiting melena or rectal bleeding. She is having 5-6 stools per day. She has very good appetite but has not been able to eat lately because of back tooth which is to be extracted in 2 days. She is drinking supplements. She has gained 20 pounds in the last 7 weeks. She has not experienced cough or shortness of breath since she had TIPS on 07/16/2015 for recurrent right pleural effusion. She states she is watching salt intake. She had blood work by Dr. Legrand Rams on 11/05/2015 and was called in prescription for Kayexalate which she never took. She does not take OTC NSAIDs. She has not had any alcohol since her hospitalization in June 2016.   Current Medications: Outpatient Encounter Prescriptions as of 11/09/2015  Medication Sig  . folic acid (FOLVITE) 1 MG tablet Take 1 tablet (1 mg total) by mouth daily.  . furosemide (LASIX) 40 MG tablet Take 40 mg by mouth daily.   . hydrOXYzine (ATARAX/VISTARIL) 25 MG tablet Take 25 mg by mouth every 6 (six) hours as needed.  . lactulose (CHRONULAC) 10 GM/15ML solution Take 30 mLs (20 g total) by mouth 3 (three) times daily. Four times a day (Patient taking differently: Take 20 g by mouth 4 (four) times daily. Four times a day)  . nadolol (CORGARD) 20 MG tablet Take 10 mg by mouth daily.   . ondansetron (ZOFRAN) 4 MG tablet Take 1 tablet (4 mg total) by mouth every 8 (eight) hours as needed for nausea or vomiting. (Patient taking differently:  Take 4 mg by mouth every 6 (six) hours as needed for nausea or vomiting. )  . rifaximin (XIFAXAN) 550 MG TABS tablet Take 550 mg by mouth 2 (two) times daily.  Marland Kitchen spironolactone (ALDACTONE) 100 MG tablet Take 1 tablet (100 mg total) by mouth daily.  Marland Kitchen thiamine 100 MG tablet Take 1 tablet (100 mg total) by mouth daily.   No facility-administered encounter medications on file as of 11/09/2015.     Objective: Blood pressure 98/68, pulse 62, temperature 97.8 F (36.6 C), temperature source Oral, resp. rate 18, height 5\' 3"  (1.6 m), weight 165 lb 14.4 oz (75.252 kg). Patient is alert and in no acute distress. Asterixis absent. Conjunctiva is pink. Sclera is nonicteric Oropharyngeal mucosa is normal. No neck masses or thyromegaly noted. Cardiac exam with regular rhythm normal S1 and S2. No murmur or gallop noted. Lungs are clear to auscultation. Abdomen is full but soft with mild tenderness at RLQ. Left lobe of the liver is easily palpable it's firm. 1-2+ pitting edema noted to both legs.  Labs/studies Results: Lab data from 11/05/2015(Dr. Fanta). Serum sodium 136, potassium 6.4, chloride 119, CO2 12, BUN 33, creatinine 2.31  Bilirubin 1.0, AP 155, AST 52, ALT 16, total protein 5.0 and albumin 1.8.    Assessment:  #1. Hepatic encephalopathy. Her condition has worsened with TIPS and noncompliance. #2. Decompensated alcoholic cirrhosis with ascites. Clinically she has minimal ascites. Lower extremity edema would not explain 20 pound weight gain. Therefore her weight gain appears  to be real. She has not had any alcohol in 6 months but she has not participated in rehabilitation program last year. #3. Azotemia. Azotemia appears to be prerenal. #4. Hyperkalemia. Patient apparently did not take Kayexalate as recommended by Dr. Legrand Rams.   Plan:  Discontinue furosemide, spironolactone and hydroxyzine. Albumin 50 g IV daily 3 doses starting today to be administered at day hospital. Patient will  have metabolic 7 and INR while at day hospital. Patient reminded that she needs to stay on low-salt diet. Referral to Dr. Monica Martinez at [facility] for transplant evaluation. Office visit in 2 weeks.

## 2015-11-09 NOTE — Patient Instructions (Signed)
Remember you on low-salt diet. Physician will call with results of blood tests when completed. Albumin IV 50 g daily for 3 days to be given at day Hospital starting today. Do not take furosemide and spironolactone until further notice.

## 2015-11-09 NOTE — Telephone Encounter (Signed)
Per Dr.Rehman the patient is to have lab work drawn , 11/11/15.

## 2015-11-10 ENCOUNTER — Encounter (HOSPITAL_COMMUNITY)
Admission: RE | Admit: 2015-11-10 | Discharge: 2015-11-10 | Disposition: A | Discharge status: Home / Self Care | Payer: Commercial Managed Care - HMO | Source: Ambulatory Visit | Attending: Internal Medicine | Admitting: Internal Medicine

## 2015-11-10 DIAGNOSIS — K7031 Alcoholic cirrhosis of liver with ascites: Secondary | ICD-10-CM | POA: Diagnosis not present

## 2015-11-10 DIAGNOSIS — K7290 Hepatic failure, unspecified without coma: Secondary | ICD-10-CM | POA: Diagnosis not present

## 2015-11-10 MED ORDER — ALBUMIN HUMAN 25 % IV SOLN
50.0000 g | INTRAVENOUS | Status: DC
  Administered 2015-11-10: 50 g via INTRAVENOUS
  Filled 2015-11-10: qty 200

## 2015-11-10 MED ORDER — SODIUM CHLORIDE 0.9 % IV SOLN
INTRAVENOUS | Status: DC
  Administered 2015-11-10: 250 mL via INTRAVENOUS

## 2015-11-11 ENCOUNTER — Encounter (HOSPITAL_COMMUNITY)
Admission: RE | Admit: 2015-11-11 | Discharge: 2015-11-11 | Disposition: A | Discharge status: Home / Self Care | Payer: Commercial Managed Care - HMO | Source: Ambulatory Visit | Attending: Internal Medicine | Admitting: Internal Medicine

## 2015-11-11 ENCOUNTER — Other Ambulatory Visit: Payer: Self-pay

## 2015-11-11 DIAGNOSIS — K7290 Hepatic failure, unspecified without coma: Secondary | ICD-10-CM | POA: Diagnosis not present

## 2015-11-11 DIAGNOSIS — K7031 Alcoholic cirrhosis of liver with ascites: Secondary | ICD-10-CM | POA: Diagnosis not present

## 2015-11-11 LAB — BASIC METABOLIC PANEL
Anion gap: 5 (ref 5–15)
BUN: 26 mg/dL — AB (ref 6–20)
CALCIUM: 8.3 mg/dL — AB (ref 8.9–10.3)
CO2: 14 mmol/L — ABNORMAL LOW (ref 22–32)
CREATININE: 1.67 mg/dL — AB (ref 0.44–1.00)
Chloride: 120 mmol/L — ABNORMAL HIGH (ref 101–111)
GFR calc Af Amer: 37 mL/min — ABNORMAL LOW (ref >60)
GFR, EST NON AFRICAN AMERICAN: 32 mL/min — AB (ref >60)
GLUCOSE: 89 mg/dL (ref 65–99)
POTASSIUM: 5.6 mmol/L — AB (ref 3.5–5.1)
Sodium: 139 mmol/L (ref 135–145)

## 2015-11-11 MED ORDER — ALBUMIN HUMAN 25 % IV SOLN
50.0000 g | Freq: Once | INTRAVENOUS | Status: AC
  Administered 2015-11-11: 50 g via INTRAVENOUS
  Filled 2015-11-11: qty 200

## 2015-11-11 MED ORDER — SODIUM CHLORIDE 0.9 % IV SOLN
INTRAVENOUS | Status: DC
  Administered 2015-11-11: 250 mL via INTRAVENOUS

## 2015-11-11 NOTE — Patient Outreach (Signed)
I called Mrs. Kuhnle to follow up on her medications.  I had to leave a HIPPA compliant message with her husband for her to call me back.  This is my second message.  I will try again if I do not hear back from her.   Deanne Coffer, PharmD, [place] [phone]

## 2015-11-11 NOTE — Progress Notes (Signed)
Results for SYRIA, KESTNER (MRN [id number]) as of 11/11/2015 13:12 Completed her 50 gm of Albumin for the 10th, 11th and 12th of January, 2017. Tolerated well.  Ref. Range 11/11/2015 12:15  Sodium Latest Ref Range: 135-145 mmol/L 139  Potassium Latest Ref Range: 3.5-5.1 mmol/L 5.6 (H)  Chloride Latest Ref Range: 101-111 mmol/L 120 (H)  CO2 Latest Ref Range: 22-32 mmol/L 14 (L)  BUN Latest Ref Range: 6-20 mg/dL 26 (H)  Creatinine Latest Ref Range: 0.44-1.00 mg/dL 1.67 (H)  Calcium Latest Ref Range: 8.9-10.3 mg/dL 8.3 (L)  EGFR (Non-African Amer.) Latest Ref Range: >60 mL/min 32 (L)  EGFR (African American) Latest Ref Range: >60 mL/min 37 (L)  Glucose Latest Ref Range: 65-99 mg/dL 89  Anion gap Latest Ref Range: 5-15  5

## 2015-11-12 ENCOUNTER — Other Ambulatory Visit: Payer: Self-pay | Admitting: *Deleted

## 2015-11-12 NOTE — Patient Outreach (Signed)
11/12/15- Telephone call to patient for transition of care week 3, spoke with pt , HIPAA verified, pt reports saw Dr. Laural Golden and furosemide discontinued and pt states " I received IV protein several treatments at hospital" pt states she is to see Dr. Laural Golden in 2 weeks, pt states she has no swelling in abdomen and "legs are going down, usually only swollen now when I'm up on them"  Weight 165 pounds at hospital.  Pt denies any confusion since last Friday. Pt reports she has all medications at present and says " I did have problem about a week ago cause [place] ran out of lactulose so I rationed my portions, but they finally got it in"  RN CM ask pt to notify RN CM if this issue arises again.  RN CM emphasized action plan.  RN CM ask to arrange home visit for next week and pt refuses citing she does not need a nurse in the home, pt is agreeable to continue with weekly transition of care calls.  [facility] CM Care Plan Problem One        Most Recent Value   Care Plan Problem One  knowledge deficit related to disease process Cirrhosis   Role Documenting the Problem One  Care Management Coordinator   Care Plan for Problem One  Active   [facility] Long Term Goal (31-90 days)  pt will have no hospital readmissions within 90 days   [facility] Long Term Goal Start Date  10/26/15 Barrie Folk restarted- pt hospitalized]   Interventions for Problem One Long Term Goal  RN CM reinforced importance of calling early for change in health status, symptoms, instructed to take medications as prescribed with emphasis on lactulose.   [facility] CM Short Term Goal #1 Start Date  -- [goal restarted]   [facility] CM Short Term Goal #1 Met Date  -- [pt saw MD 09/13/15]    [facility] CM Care Plan Problem Two        Most Recent Value   Care Plan Problem Two  Pt desires to have increased endurance "and do the things I used to do"   Role Documenting the Problem Two  Care Management [place] for Problem Two  Active   [facility] CM Short Term Goal #1 (0-30  days)  pt will verbalize increased endurance with activity tolerance within 30 days.   [facility] CM Short Term Goal #1 Start Date  11/12/15 [goal restarted]   Interventions for Short Term Goal #2   RN CM reminded pt of importance of walking or doing some type of activity daily.     PLAN Continue weekly transition of care calls.  Jacqlyn Larsen [facility], [place] Coordinator [phone]

## 2015-11-15 ENCOUNTER — Telehealth (INDEPENDENT_AMBULATORY_CARE_PROVIDER_SITE_OTHER): Payer: Self-pay | Admitting: *Deleted

## 2015-11-15 ENCOUNTER — Encounter (INDEPENDENT_AMBULATORY_CARE_PROVIDER_SITE_OTHER): Payer: Self-pay | Admitting: *Deleted

## 2015-11-15 ENCOUNTER — Telehealth (INDEPENDENT_AMBULATORY_CARE_PROVIDER_SITE_OTHER): Payer: Self-pay | Admitting: Internal Medicine

## 2015-11-15 DIAGNOSIS — K7031 Alcoholic cirrhosis of liver with ascites: Secondary | ICD-10-CM

## 2015-11-15 NOTE — Telephone Encounter (Signed)
Per Dr.Rehman the patient will need to have labs drawn prior to her OV 11/23/15.

## 2015-11-15 NOTE — Telephone Encounter (Signed)
Patient's daughter Savannah Jackson called to state her mother's LE edema increased. She was advised to give Savannah Jackson 40 mg of furosemide today. Also advised to keep by mouth fluid intake to 60 ounces per day. She will call office with progress report. She also needs to check her weight every 4 days if not daily.

## 2015-11-15 NOTE — Discharge Summary (Signed)
Physician Discharge Summary  Patient ID: Savannah Jackson MRN: [id number] DOB/AGE: 02/04/1956 64 y.o. Primary Care Physician:Guiseppe Flanagan, MD Admit date: 10/19/2015 Discharge date: 10/22/15   Discharge Diagnoses:   Active Problems:   Alcoholic cirrhosis of liver with ascites (HCC)   Hepatic encephalopathy (HCC)   Elevated troponin   Acute renal failure (ARF) (HCC)   Metabolic acidosis     Medication List    STOP taking these medications        furosemide 40 MG tablet  Commonly known as:  LASIX     hydrOXYzine 25 MG tablet  Commonly known as:  ATARAX/VISTARIL     spironolactone 100 MG tablet  Commonly known as:  ALDACTONE      TAKE these medications        folic acid 1 MG tablet  Commonly known as:  FOLVITE  Take 1 tablet (1 mg total) by mouth daily.     lactulose 10 GM/15ML solution  Commonly known as:  CHRONULAC  Take 30 mLs (20 g total) by mouth 3 (three) times daily. Four times a day     ondansetron 4 MG tablet  Commonly known as:  ZOFRAN  Take 1 tablet (4 mg total) by mouth every 8 (eight) hours as needed for nausea or vomiting.     rifaximin 550 MG Tabs tablet  Commonly known as:  XIFAXAN  Take 550 mg by mouth 2 (two) times daily.     thiamine 100 MG tablet  Take 1 tablet (100 mg total) by mouth daily.        Discharged Condition:  improved    Consults:  None  Significant Diagnostic Studies: Dg Chest Portable 1 View  10/19/2015  CLINICAL DATA:  Altered mental status, combative, abdominal pain, history breast cancer, hypertension, cirrhosis, ethanol abuse EXAM: PORTABLE CHEST 1 VIEW COMPARISON:  Portable exam 1827 hours compared to 09/24/2015 FINDINGS: Upper normal heart size. Mediastinal contours and pulmonary vascularity normal. Slight rotation to the RIGHT. Lungs clear. No pleural effusion or pneumothorax. Bones demineralized. IMPRESSION: No acute abnormalities. Electronically Signed   By: Lavonia Dana M.D.   On: 10/19/2015 18:39    Lab  Results: Basic Metabolic Panel: No results for input(s): NA, K, CL, CO2, GLUCOSE, BUN, CREATININE, CALCIUM, MG, PHOS in the last 72 hours. Liver Function Tests: No results for input(s): AST, ALT, ALKPHOS, BILITOT, PROT, ALBUMIN in the last 72 hours.   CBC: No results for input(s): WBC, NEUTROABS, HGB, HCT, MCV, PLT in the last 72 hours.  No results found for this or any previous visit (from the past 240 hour(s)).   Hospital Course:   This is a 60 years old female with history of advanced liver disease secondary of alcoholic cirrhosis was readmitted due to hepatic encephalopathy. She was in and out of hospital due to similar illnesses. He troponin was slightly elevated but no sign of cardiac problem. She was initially started on IV antibiotic for concern of SBP. Over the hospital stay patient improved and she is back to her baseline. She will be followed in out patient.  Discharge Exam: Blood pressure 126/86, pulse 75, temperature 97 F (36.1 C), temperature source Oral, resp. rate 18, height 5\' 3"  (1.6 m), weight 66.9 kg (147 lb 7.8 oz), SpO2 100 %.   Disposition:  Home        Follow-up Information    Follow up with [facility], MD In 2 weeks.   Specialty:  Internal Medicine   Contact information:   [address]  [place] [address] [phone]       Signed: Tamica Covell   11/15/2015, 7:59 AM

## 2015-11-16 ENCOUNTER — Other Ambulatory Visit (HOSPITAL_COMMUNITY): Payer: Self-pay | Admitting: Interventional Radiology

## 2015-11-16 ENCOUNTER — Telehealth (INDEPENDENT_AMBULATORY_CARE_PROVIDER_SITE_OTHER): Payer: Self-pay | Admitting: Internal Medicine

## 2015-11-16 ENCOUNTER — Encounter: Payer: Self-pay | Admitting: Radiology

## 2015-11-16 ENCOUNTER — Other Ambulatory Visit: Payer: Self-pay | Admitting: Radiology

## 2015-11-16 DIAGNOSIS — Z95828 Presence of other vascular implants and grafts: Secondary | ICD-10-CM

## 2015-11-16 DIAGNOSIS — J918 Pleural effusion in other conditions classified elsewhere: Secondary | ICD-10-CM

## 2015-11-16 DIAGNOSIS — J90 Pleural effusion, not elsewhere classified: Secondary | ICD-10-CM

## 2015-11-16 DIAGNOSIS — K769 Liver disease, unspecified: Principal | ICD-10-CM

## 2015-11-16 DIAGNOSIS — K7031 Alcoholic cirrhosis of liver with ascites: Secondary | ICD-10-CM

## 2015-11-16 NOTE — Telephone Encounter (Signed)
Noted this information.

## 2015-11-16 NOTE — Telephone Encounter (Signed)
Per Dr.Rehman patient states she is to take Lasix 40 mg , wait 30 minutes , then take a 5 mg Zaroxolyn. She is to take another dose. (the same) on Thursday.  A rx was called to [facility] for Zaroxolyn 5 mg Take 1 now ,another dose on Thursday. Then as directed. Patient was called and made aware, via a message was left on answering service.

## 2015-11-16 NOTE — Telephone Encounter (Signed)
Savannah Jackson called saying she still has swelling in her feet and legs and doesn't know what to do. She's taking the Lasix but it doesn't seem to be working. She'd like a phone call regarding this.  Pt's ph# [phone] Thank you.

## 2015-11-17 ENCOUNTER — Encounter (INDEPENDENT_AMBULATORY_CARE_PROVIDER_SITE_OTHER): Payer: Self-pay

## 2015-11-18 ENCOUNTER — Other Ambulatory Visit: Payer: Self-pay | Admitting: *Deleted

## 2015-11-18 NOTE — Patient Outreach (Signed)
11/18/15- Telephone call to patient for transition of care week 4, spoke with pt, HIPAA verified, pt states "swelling is better", pt reports she has lactulose, xifaxin and all medications, is keeping all appointments, no new problems or concerns reported,  RN CM ask pt once again to schedule home visit and pt declines, pt is agreeable to receive one more phone call next week to complete transition of care, then pt requests discharge.  [facility] CM Care Plan Problem One        Most Recent Value   Care Plan Problem One  knowledge deficit related to disease process Cirrhosis   Role Documenting the Problem One  Care Management Coordinator   Care Plan for Problem One  Active   [facility] Long Term Goal (31-90 days)  pt will have no hospital readmissions within 90 days   [facility]N Long Term Goal Start Date  10/26/15 Barrie Folk restarted- pt hospitalized]   Interventions for Problem One Long Term Goal  RN CM reinforced action plan and importance of taking all medications as prescribed, reviewed signs/ symptoms elevated ammonia levels, exacerbation of symptoms such as swelling, etc.   [facility] CM Short Term Goal #1 Start Date  -- [goal restarted]   [facility] CM Short Term Goal #1 Met Date  -- [pt saw MD 09/13/15]    [facility] CM Care Plan Problem Two        Most Recent Value   Care Plan Problem Two  Pt desires to have increased endurance "and do the things I used to do"   Role Documenting the Problem Two  Care Management [place] for Problem Two  Active   [facility] CM Short Term Goal #1 (0-30 days)  pt will verbalize increased endurance with activity tolerance within 30 days.   [facility] CM Short Term Goal #1 Start Date  11/12/15 [goal restarted]   Interventions for Short Term Goal #2   RN CM reviewed with pt of importance of walking or doing some type of activity daily.      Jacqlyn Larsen [facility], BSN [place] Coordinator [phone] '

## 2015-11-22 ENCOUNTER — Other Ambulatory Visit (HOSPITAL_COMMUNITY): Payer: Self-pay | Admitting: Internal Medicine

## 2015-11-22 DIAGNOSIS — N644 Mastodynia: Secondary | ICD-10-CM

## 2015-11-22 DIAGNOSIS — Z1231 Encounter for screening mammogram for malignant neoplasm of breast: Secondary | ICD-10-CM

## 2015-11-22 DIAGNOSIS — K7030 Alcoholic cirrhosis of liver without ascites: Secondary | ICD-10-CM | POA: Diagnosis not present

## 2015-11-22 DIAGNOSIS — K7031 Alcoholic cirrhosis of liver with ascites: Secondary | ICD-10-CM | POA: Diagnosis not present

## 2015-11-23 ENCOUNTER — Other Ambulatory Visit (INDEPENDENT_AMBULATORY_CARE_PROVIDER_SITE_OTHER): Payer: Self-pay | Admitting: Internal Medicine

## 2015-11-23 ENCOUNTER — Encounter (INDEPENDENT_AMBULATORY_CARE_PROVIDER_SITE_OTHER): Payer: Self-pay | Admitting: Internal Medicine

## 2015-11-23 ENCOUNTER — Ambulatory Visit (INDEPENDENT_AMBULATORY_CARE_PROVIDER_SITE_OTHER): Payer: Commercial Managed Care - HMO | Admitting: Internal Medicine

## 2015-11-23 VITALS — BP 100/82 | HR 60 | Temp 97.8°F | Resp 18 | Ht 63.0 in | Wt 174.9 lb

## 2015-11-23 DIAGNOSIS — R600 Localized edema: Secondary | ICD-10-CM

## 2015-11-23 DIAGNOSIS — K7031 Alcoholic cirrhosis of liver with ascites: Secondary | ICD-10-CM

## 2015-11-23 DIAGNOSIS — R188 Other ascites: Secondary | ICD-10-CM

## 2015-11-23 DIAGNOSIS — K7290 Hepatic failure, unspecified without coma: Secondary | ICD-10-CM

## 2015-11-23 DIAGNOSIS — K7682 Hepatic encephalopathy: Secondary | ICD-10-CM

## 2015-11-23 DIAGNOSIS — K7460 Unspecified cirrhosis of liver: Secondary | ICD-10-CM

## 2015-11-23 LAB — BASIC METABOLIC PANEL
BUN: 28 mg/dL — ABNORMAL HIGH (ref 7–25)
CALCIUM: 7.1 mg/dL — AB (ref 8.6–10.4)
CHLORIDE: 116 mmol/L — AB (ref 98–110)
CO2: 20 mmol/L (ref 20–31)
CREATININE: 1.53 mg/dL — AB (ref 0.50–0.99)
Glucose, Bld: 105 mg/dL — ABNORMAL HIGH (ref 65–99)
Potassium: 3.7 mmol/L (ref 3.5–5.3)
SODIUM: 140 mmol/L (ref 135–146)

## 2015-11-23 LAB — PROTIME-INR
INR: 1.4 (ref <1.50)
Prothrombin Time: 17.3 seconds — ABNORMAL HIGH (ref 11.6–15.2)

## 2015-11-23 MED ORDER — LACTULOSE 10 GM/15ML PO SOLN: 15.0000 g | Freq: Four times a day (QID) | ORAL | Status: DC

## 2015-11-23 NOTE — Progress Notes (Signed)
Presenting complaint;  Follow-up for chronic liver disease. Lower extremity edema.  Subjective:  Patient is here for scheduled visit accompanied by her husband. Her daughter [name] could not come today. She was last seen 2 weeks ago. She was treated for hyperkalemia with Kayexalate. Spironolactone was discontinued. Since her last visit she was also given albumin 50 g IV daily 3 doses. She was advised to go back on furosemide and also given 4 doses of metolazone. She continues to complain of worsening lower extremity edema. She has gained 9 pounds since her last visit 2 weeks ago. She states she is watching salt intake her husband states she had almost 3/4 of a pizza  last night. She has not had any more confusion. She denies shortness of breath. She feels abdomen is full. She is having 4-6 stools per day. She has difficulty controlling. She has not been confused since her last visit. She denies melena or rectal bleeding. She says she has a very good appetite. She had blood work yesterday as recommended.     Current Medications: Outpatient Encounter Prescriptions as of 11/23/2015  Medication Sig  . folic acid (FOLVITE) 1 MG tablet Take 1 tablet (1 mg total) by mouth daily.  . furosemide (LASIX) 40 MG tablet Take 40 mg by mouth daily.   Marland Kitchen lactulose (CHRONULAC) 10 GM/15ML solution Take 30 mLs (20 g total) by mouth 3 (three) times daily. Four times a day (Patient taking differently: Take 20 g by mouth 4 (four) times daily. Four times a day)  . metolazone (ZAROXOLYN) 5 MG tablet Take 5 mg by mouth daily.  . nadolol (CORGARD) 20 MG tablet Take 10 mg by mouth daily.   . rifaximin (XIFAXAN) 550 MG TABS tablet Take 550 mg by mouth 2 (two) times daily.  Marland Kitchen thiamine 100 MG tablet Take 1 tablet (100 mg total) by mouth daily.  . [DISCONTINUED] ondansetron (ZOFRAN) 4 MG tablet Take 1 tablet (4 mg total) by mouth every 8 (eight) hours as needed for nausea or vomiting. (Patient not taking: Reported on  11/23/2015)   No facility-administered encounter medications on file as of 11/23/2015.     Objective: Blood pressure 100/82, pulse 60, temperature 97.8 F (36.6 C), temperature source Oral, resp. rate 18, height 5\' 3"  (1.6 m), weight 174 lb 14.4 oz (79.334 kg). Patient is alert and in no acute distress. She does not have asterixis. Conjunctiva is pink. Sclera is nonicteric Oropharyngeal mucosa is normal. No neck masses or thyromegaly noted. Cardiac exam with regular rhythm normal S1 and S2. No murmur or gallop noted. Lungs are clear to auscultation. Abdomen is full but soft and nontender without organomegaly or masses. She has 3+ pitting edema to both legs and she also has mild pitting edema involving her thighs.  Labs/studies Results:   Recent Labs  11/22/15 1124  NA 140  K 3.7  CL 116*  CO2 20  GLUCOSE 105*  BUN 28*  CREATININE 1.53*  CALCIUM 7.1*     PT/INR   Recent Labs  11/22/15 1124  LABPROT 17.3*  INR 1.40     Assessment:  #1. Fluid overload primarily in the form of lower extremity edema. She has gained 30 pounds in the last 2 months and 9 pounds in the last 2 weeks. She has not responded to diuretic therapy. Unfortunately she is not compliant with salt restriction. Spironolactone is contraindicated because of hyperkalemia which has resolved. #2. Alcoholic cirrhosis. Hepatic function has deteriorated since she had TIPS laced over 4  months ago. She is scheduled to have an ultrasound by Dr. Laurence Ferrari in 4 weeks. TIPS was placed because of recurrent right-sided pleural effusion resulting in difficulty breathing and intractable cough. Pleural effusion has resolved since TIPS was placed #3. Hepatic encephalopathy made worse with TIPS. Clinically she does not appear to be encephalopathic today. She could drop lactulose dose by 25%. #4. Mildly elevated serum creatinine.   Plan:  Dietary consultation for patient education in effort to restrict sodium  intake. Increase furosemide to 80 mg by mouth every morning. Patient will stop by the office her weight check on 11/26/2015 at which time may consider metabolic 7. Decrease lactulose to 15 g by mouth 4 times a day. Office visit in 4 weeks.

## 2015-11-23 NOTE — Patient Instructions (Signed)
Take an extra dose of furosemide 40 mg this afternoon. Take 80 mg every morning starting tomorrow. Dietary consultation to be arranged. Stop by the office on 11/26/2015 for weight check.

## 2015-11-26 ENCOUNTER — Telehealth (INDEPENDENT_AMBULATORY_CARE_PROVIDER_SITE_OTHER): Payer: Self-pay | Admitting: *Deleted

## 2015-11-26 ENCOUNTER — Inpatient Hospital Stay (HOSPITAL_COMMUNITY)
Admission: EM | Admit: 2015-11-26 | Discharge: 2015-12-01 | DRG: 433 | Disposition: A | Discharge status: Home / Self Care | Payer: Commercial Managed Care - HMO | Attending: Internal Medicine | Admitting: Internal Medicine

## 2015-11-26 ENCOUNTER — Encounter (HOSPITAL_COMMUNITY): Payer: Self-pay

## 2015-11-26 ENCOUNTER — Ambulatory Visit: Payer: Self-pay | Admitting: *Deleted

## 2015-11-26 ENCOUNTER — Other Ambulatory Visit: Payer: Self-pay | Admitting: *Deleted

## 2015-11-26 ENCOUNTER — Emergency Department (HOSPITAL_COMMUNITY): Payer: Commercial Managed Care - HMO

## 2015-11-26 DIAGNOSIS — N179 Acute kidney failure, unspecified: Secondary | ICD-10-CM | POA: Diagnosis present

## 2015-11-26 DIAGNOSIS — D469 Myelodysplastic syndrome, unspecified: Secondary | ICD-10-CM | POA: Diagnosis not present

## 2015-11-26 DIAGNOSIS — R0602 Shortness of breath: Secondary | ICD-10-CM | POA: Diagnosis not present

## 2015-11-26 DIAGNOSIS — K7460 Unspecified cirrhosis of liver: Secondary | ICD-10-CM

## 2015-11-26 DIAGNOSIS — K7031 Alcoholic cirrhosis of liver with ascites: Secondary | ICD-10-CM

## 2015-11-26 DIAGNOSIS — N183 Chronic kidney disease, stage 3 (moderate): Secondary | ICD-10-CM | POA: Diagnosis present

## 2015-11-26 DIAGNOSIS — Z853 Personal history of malignant neoplasm of breast: Secondary | ICD-10-CM | POA: Diagnosis not present

## 2015-11-26 DIAGNOSIS — N184 Chronic kidney disease, stage 4 (severe): Secondary | ICD-10-CM | POA: Diagnosis present

## 2015-11-26 DIAGNOSIS — Z9111 Patient's noncompliance with dietary regimen: Secondary | ICD-10-CM

## 2015-11-26 DIAGNOSIS — K7290 Hepatic failure, unspecified without coma: Secondary | ICD-10-CM | POA: Diagnosis present

## 2015-11-26 DIAGNOSIS — R601 Generalized edema: Secondary | ICD-10-CM

## 2015-11-26 DIAGNOSIS — I10 Essential (primary) hypertension: Secondary | ICD-10-CM | POA: Diagnosis not present

## 2015-11-26 DIAGNOSIS — Z6831 Body mass index (BMI) 31.0-31.9, adult: Secondary | ICD-10-CM | POA: Diagnosis not present

## 2015-11-26 DIAGNOSIS — E876 Hypokalemia: Secondary | ICD-10-CM | POA: Diagnosis present

## 2015-11-26 DIAGNOSIS — F1010 Alcohol abuse, uncomplicated: Secondary | ICD-10-CM | POA: Diagnosis not present

## 2015-11-26 DIAGNOSIS — E8770 Fluid overload, unspecified: Secondary | ICD-10-CM | POA: Diagnosis present

## 2015-11-26 DIAGNOSIS — N178 Other acute kidney failure: Secondary | ICD-10-CM | POA: Diagnosis not present

## 2015-11-26 DIAGNOSIS — Z9049 Acquired absence of other specified parts of digestive tract: Secondary | ICD-10-CM

## 2015-11-26 DIAGNOSIS — K7030 Alcoholic cirrhosis of liver without ascites: Secondary | ICD-10-CM | POA: Diagnosis not present

## 2015-11-26 DIAGNOSIS — R609 Edema, unspecified: Secondary | ICD-10-CM | POA: Diagnosis present

## 2015-11-26 DIAGNOSIS — I129 Hypertensive chronic kidney disease with stage 1 through stage 4 chronic kidney disease, or unspecified chronic kidney disease: Secondary | ICD-10-CM | POA: Diagnosis present

## 2015-11-26 DIAGNOSIS — E669 Obesity, unspecified: Secondary | ICD-10-CM | POA: Diagnosis present

## 2015-11-26 DIAGNOSIS — R9431 Abnormal electrocardiogram [ECG] [EKG]: Secondary | ICD-10-CM | POA: Diagnosis not present

## 2015-11-26 DIAGNOSIS — G4730 Sleep apnea, unspecified: Secondary | ICD-10-CM | POA: Diagnosis present

## 2015-11-26 DIAGNOSIS — D638 Anemia in other chronic diseases classified elsewhere: Secondary | ICD-10-CM | POA: Diagnosis present

## 2015-11-26 DIAGNOSIS — R600 Localized edema: Secondary | ICD-10-CM | POA: Diagnosis present

## 2015-11-26 DIAGNOSIS — Z96651 Presence of right artificial knee joint: Secondary | ICD-10-CM | POA: Diagnosis present

## 2015-11-26 DIAGNOSIS — D649 Anemia, unspecified: Secondary | ICD-10-CM | POA: Diagnosis not present

## 2015-11-26 LAB — CBC WITH DIFFERENTIAL/PLATELET
BASOS PCT: 3 %
Basophils Absolute: 0.2 10*3/uL — ABNORMAL HIGH (ref 0.0–0.1)
EOS ABS: 0.5 10*3/uL (ref 0.0–0.7)
Eosinophils Relative: 8 %
HCT: 25.2 % — ABNORMAL LOW (ref 36.0–46.0)
HEMOGLOBIN: 8.6 g/dL — AB (ref 12.0–15.0)
LYMPHS ABS: 2.1 10*3/uL (ref 0.7–4.0)
Lymphocytes Relative: 32 %
MCH: 27.5 pg (ref 26.0–34.0)
MCHC: 34.1 g/dL (ref 30.0–36.0)
MCV: 80.5 fL (ref 78.0–100.0)
Monocytes Absolute: 1 10*3/uL (ref 0.1–1.0)
Monocytes Relative: 16 %
NEUTROS PCT: 41 %
Neutro Abs: 2.8 10*3/uL (ref 1.7–7.7)
Platelets: 162 10*3/uL (ref 150–400)
RBC: 3.13 MIL/uL — AB (ref 3.87–5.11)
RDW: 17.1 % — ABNORMAL HIGH (ref 11.5–15.5)
WBC: 6.7 10*3/uL (ref 4.0–10.5)

## 2015-11-26 LAB — COMPREHENSIVE METABOLIC PANEL
ALT: 18 U/L (ref 14–54)
AST: 54 U/L — AB (ref 15–41)
Albumin: 1.7 g/dL — ABNORMAL LOW (ref 3.5–5.0)
Alkaline Phosphatase: 175 U/L — ABNORMAL HIGH (ref 38–126)
Anion gap: 6 (ref 5–15)
BUN: 34 mg/dL — AB (ref 6–20)
CHLORIDE: 113 mmol/L — AB (ref 101–111)
CO2: 22 mmol/L (ref 22–32)
Calcium: 7.9 mg/dL — ABNORMAL LOW (ref 8.9–10.3)
Creatinine, Ser: 1.5 mg/dL — ABNORMAL HIGH (ref 0.44–1.00)
GFR calc Af Amer: 43 mL/min — ABNORMAL LOW (ref >60)
GFR, EST NON AFRICAN AMERICAN: 37 mL/min — AB (ref >60)
Glucose, Bld: 104 mg/dL — ABNORMAL HIGH (ref 65–99)
POTASSIUM: 3.8 mmol/L (ref 3.5–5.1)
SODIUM: 141 mmol/L (ref 135–145)
Total Bilirubin: 1.2 mg/dL (ref 0.3–1.2)
Total Protein: 4.3 g/dL — ABNORMAL LOW (ref 6.5–8.1)

## 2015-11-26 LAB — PROTIME-INR
INR: 1.45 (ref 0.00–1.49)
PROTHROMBIN TIME: 17.7 s — AB (ref 11.6–15.2)

## 2015-11-26 MED ORDER — ALBUMIN HUMAN 25 % IV SOLN
50.0000 g | Freq: Once | INTRAVENOUS | Status: DC
  Filled 2015-11-26: qty 200

## 2015-11-26 MED ORDER — FOLIC ACID 1 MG PO TABS
1.0000 mg | ORAL_TABLET | Freq: Every day | ORAL | Status: DC
  Administered 2015-11-27 – 2015-12-01 (×5): 1 mg via ORAL
  Filled 2015-11-26 (×5): qty 1

## 2015-11-26 MED ORDER — RIFAXIMIN 550 MG PO TABS
550.0000 mg | ORAL_TABLET | Freq: Two times a day (BID) | ORAL | Status: DC
  Administered 2015-11-26 – 2015-12-01 (×10): 550 mg via ORAL
  Filled 2015-11-26 (×10): qty 1

## 2015-11-26 MED ORDER — NADOLOL 20 MG PO TABS
10.0000 mg | ORAL_TABLET | Freq: Every day | ORAL | Status: DC
  Administered 2015-11-27 – 2015-12-01 (×5): 10 mg via ORAL
  Filled 2015-11-26 (×7): qty 1

## 2015-11-26 MED ORDER — ONDANSETRON HCL 4 MG PO TABS: 4.0000 mg | ORAL_TABLET | Freq: Four times a day (QID) | ORAL | Status: DC | PRN

## 2015-11-26 MED ORDER — ONDANSETRON HCL 4 MG/2ML IJ SOLN: 4.0000 mg | Freq: Four times a day (QID) | INTRAMUSCULAR | Status: DC | PRN

## 2015-11-26 MED ORDER — OXYCODONE HCL 5 MG PO TABS
5.0000 mg | ORAL_TABLET | ORAL | Status: DC | PRN
  Administered 2015-11-27 (×2): 5 mg via ORAL
  Filled 2015-11-26 (×2): qty 1

## 2015-11-26 MED ORDER — VITAMIN B-1 100 MG PO TABS
100.0000 mg | ORAL_TABLET | Freq: Every day | ORAL | Status: DC
  Administered 2015-11-27 – 2015-12-01 (×5): 100 mg via ORAL
  Filled 2015-11-26 (×4): qty 1

## 2015-11-26 MED ORDER — LACTULOSE 10 GM/15ML PO SOLN
20.0000 g | Freq: Three times a day (TID) | ORAL | Status: DC
  Administered 2015-11-26 – 2015-11-27 (×5): 20 g via ORAL
  Filled 2015-11-26 (×5): qty 30

## 2015-11-26 MED ORDER — ALBUMIN HUMAN 25 % IV SOLN
50.0000 g | Freq: Every day | INTRAVENOUS | Status: DC
  Administered 2015-11-27: 50 g via INTRAVENOUS
  Filled 2015-11-26 (×3): qty 200

## 2015-11-26 MED ORDER — FUROSEMIDE 10 MG/ML IJ SOLN
40.0000 mg | Freq: Two times a day (BID) | INTRAMUSCULAR | Status: DC
  Administered 2015-11-26 – 2015-11-27 (×3): 40 mg via INTRAVENOUS
  Filled 2015-11-26 (×3): qty 4

## 2015-11-26 MED ORDER — SODIUM CHLORIDE 0.9% FLUSH
3.0000 mL | INTRAVENOUS | Status: DC | PRN
  Administered 2015-11-28: 3 mL via INTRAVENOUS
  Filled 2015-11-26: qty 3

## 2015-11-26 MED ORDER — SODIUM CHLORIDE 0.9% FLUSH
3.0000 mL | Freq: Two times a day (BID) | INTRAVENOUS | Status: DC
  Administered 2015-11-26 – 2015-11-30 (×9): 3 mL via INTRAVENOUS

## 2015-11-26 MED ORDER — SODIUM CHLORIDE 0.9 % IV SOLN: 250.0000 mL | INTRAVENOUS | Status: DC | PRN

## 2015-11-26 MED ORDER — SENNOSIDES-DOCUSATE SODIUM 8.6-50 MG PO TABS: 1.0000 | ORAL_TABLET | Freq: Every evening | ORAL | Status: DC | PRN

## 2015-11-26 NOTE — Telephone Encounter (Signed)
Patient presented to the office today for a weight check. She weighed 179.5 lbs. At the time of her OV 11/23/15 , she weighed 174 lbs 14.4 oz.  Dr.Rehman made aware. He advised that the patient go to the ED , he will talk with the ED physician.  Patient was told , and she ask that her daughter , [name] be called. We have done that. Patient at ED.

## 2015-11-26 NOTE — Progress Notes (Signed)
GI attending note: Briefly patient is 60 year old American female who has alcoholic cirrhosis with following complications. #1. She developed recurrent right pleural effusion and eventually ended up with TIPS placement at [facility] in September 2016. After which she developed hepatic encephalopathy. #2. Hepatic encephalopathy. She has been hospitalized on at least 2 occasions. She has done well with lactulose and Xifaxan. #3. Anemia. Anemia appears to be due to chronic disease. She did undergo EGD back in June 23 when she presented with nausea vomiting and coffee-ground emesis and noted to have grade 1 esophageal varices and portal gastropathy. It was felt she may have bled from Mallory-Weiss tear which had healed by the time of the examination. She did colonoscopy in June 2014 revealing pancolonic diverticulosis she had 4 polyps removed and these are tubular adenomas. #4. Fluid overload. In the last 2 months she has gained over 30 pounds. She had ultrasound in October 2016 and was negative for ascites. On 08/31/2015 she weighed 143 pounds. She was seen in the office on 11/09/2015 when she weighed 166 pounds. Her BUN was 33 and creatinine 2.31 and serum potassium was 6.4. Furosemide and spironolactone were discontinued hypokalemia treated with Kayexalate. She was given albumin 50 g IV daily for 3 days. As renal function improve she was begun on furosemide did not respond. 3 days ago she weighed 175 pounds in the office. Furosemide dose was doubled and she was given a few doses of metolazone. Patient came to the office today and she had gained another 4 pounds. She was also complaining of shortness of breath and difficulty walking because of LE edema and stiffness. Therefore admission was advised and she was sent to emergency room.  Regarding her advanced alcoholic cirrhosis she has an appointment to see Dr. Hal Neer [facility] on 12/07/2015.  Objective: Weight in the office was 179.5 pounds She is alert  and does not have asterixis. Abdomen is full but not tense or tender. She has 3+ pitting edema involving both legs. She also has some edema involving thighs.  Recommendations:  By mouth fluid restriction to 1.5 L per day. Urinary sodium. 2 g sodium diet. Dietary consultation for patient education. Albumin 50 g IV daily/ Furosemide 40 mg IV every 12. Daily weights. Daily metabolic 7. Abdominal ultrasound with Doppler on 11/29/2015.

## 2015-11-26 NOTE — ED Provider Notes (Signed)
CSN: [id number]     Arrival date & time 11/26/15  [id number] History  By signing my name below, I, Hilda Lias, attest that this documentation has been prepared under the direction and in the presence of Davonna Belling, MD. Electronically Signed: Hilda Lias, ED Scribe. 11/26/2015. 9:53 AM.    Chief Complaint  Patient presents with  . Shortness of Breath  . Leg Swelling     Patient is a 60 y.o. female presenting with shortness of breath. The history is provided by the patient. No language interpreter was used.  Shortness of Breath Associated symptoms: no chest pain    HPI Comments: Savannah Jackson is a 60 y.o. female with PMHx of HTN, bilateral leg edema who presents to the Emergency Department complaining of constant, worsening bilateral leg swelling that has been present for one week. Her husband reports she has gained 15 lbs in the last 3 days from fluid accumulation. Pt also reports she has been fatigued recently. Pt saw PCP recently, and states her physician's plan was to reduce her lactulose dosage, and keep her fluid medication dosage the same. Pt was seen at her gastroenterologist Dr. Josephine Cables office three days ago for similar symptoms, and reports to ED today to be re-weighed due to complaints that she had gained a lot of weight over the past 3 days. Her husband denies confusion. Pt denies SOB or chest pain.    Past Medical History  Diagnosis Date  . Hypertension   . Brain aneurysm   . Rotator cuff syndrome of left shoulder   . DDD (degenerative disc disease), lumbar   . Chronic back pain Diverticultis  . Cirrhosis ([place])     alcoholic  . Sleep apnea   . ETOH abuse   . Bilateral leg edema   . Hepatomegaly     scope 2014  . Diverticulosis     scope 2014  . Cancer [facility])     breast cancer   Past Surgical History  Procedure Laterality Date  . Cholecystectomy    . Brain surgery    . Breast surgery    . Total knee arthroplasty      right. 2002  . Cataract extraction  Bilateral     [facility] 2 or 3 years ago  . Colonoscopy N/A 04/10/2013    Procedure: COLONOSCOPY;  Surgeon: Rogene Houston, MD;  [facility];  Service: Endoscopy;  Laterality: N/A;  1030-rescheduled to 1200 Ann notified pt  . Esophagogastroduodenoscopy N/A 04/22/2015    Procedure: ESOPHAGOGASTRODUODENOSCOPY (EGD);  Surgeon: Rogene Houston, MD;  [facility];  Service: Endoscopy;  Laterality: N/A;  . Radiology with anesthesia N/A 07/16/2015    Procedure: TIPS;  Surgeon: Medication Radiologist, MD;  Location: [place];  Service: Radiology;  Laterality: N/A;   No family history on file. Social History  Substance Use Topics  . Smoking status: Never Smoker   . Smokeless tobacco: Never Used  . Alcohol Use: No     Comment: none since 03/2015   OB History    Gravida Para Term Preterm AB TAB SAB Ectopic Multiple Living            1     Review of Systems  Respiratory: Negative for shortness of breath.   Cardiovascular: Positive for leg swelling. Negative for chest pain.  Psychiatric/Behavioral: Negative for behavioral problems and confusion.  All other systems reviewed and are negative.     Allergies  Latex; Vicodin; and Tylenol  Home Medications  Prior to Admission medications   Medication Sig Start Date End Date Taking? Authorizing Provider  folic acid (FOLVITE) 1 MG tablet Take 1 tablet (1 mg total) by mouth daily. 04/29/15  Yes Rosita Fire, MD  furosemide (LASIX) 40 MG tablet Take 80 mg by mouth daily. 11/17/15  Yes Historical Provider, MD  lactulose (CHRONULAC) 10 GM/15ML solution Take 22.5 mLs (15 g total) by mouth 4 (four) times daily. Four times a day 11/23/15  Yes Rogene Houston, MD  nadolol (CORGARD) 20 MG tablet Take 10 mg by mouth daily.  11/02/15  Yes Historical Provider, MD  rifaximin (XIFAXAN) 550 MG TABS tablet Take 550 mg by mouth 2 (two) times daily.   Yes Jacqulynn Cadet, MD  thiamine 100 MG tablet Take 1 tablet (100 mg total) by mouth daily. 04/29/15   Yes Tesfaye Fanta, MD   BP 154/85 mmHg  Pulse 56  Temp(Src) 97.5 F (36.4 C) (Oral)  Resp 20  Ht 5\' 3"  (1.6 m)  Wt 177 lb 9.6 oz (80.559 kg)  BMI 31.47 kg/m2  SpO2 100% Physical Exam  Constitutional: She appears well-developed and well-nourished. No distress.  Neck: Normal range of motion. Neck supple.  Cardiovascular: Normal rate and regular rhythm.   Pulmonary/Chest: Effort normal and breath sounds normal. No respiratory distress. She has no wheezes. She has no rales. She exhibits no tenderness.  Abdominal: Soft. She exhibits no distension. There is no tenderness.  Musculoskeletal: She exhibits edema.  Moderate to severe peripheral edema of both lower extremities  Skin: She is not diaphoretic.  Psychiatric: She has a normal mood and affect. Her behavior is normal.    ED Course  Procedures (including critical care time)  DIAGNOSTIC STUDIES: Oxygen Saturation is 98% on room air, normal by my interpretation.    COORDINATION OF CARE: 9:47 AM Discussed treatment plan with pt at bedside and pt agreed to plan.   Labs Review Labs Reviewed  COMPREHENSIVE METABOLIC PANEL - Abnormal; Notable for the following:    Chloride 113 (*)    Glucose, Bld 104 (*)    BUN 34 (*)    Creatinine, Ser 1.50 (*)    Calcium 7.9 (*)    Total Protein 4.3 (*)    Albumin 1.7 (*)    AST 54 (*)    Alkaline Phosphatase 175 (*)    GFR calc non Af Amer 37 (*)    GFR calc Af Amer 43 (*)    All other components within normal limits  PROTIME-INR - Abnormal; Notable for the following:    Prothrombin Time 17.7 (*)    All other components within normal limits  CBC WITH DIFFERENTIAL/PLATELET - Abnormal; Notable for the following:    RBC 3.13 (*)    Hemoglobin 8.6 (*)    HCT 25.2 (*)    RDW 17.1 (*)    Basophils Absolute 0.2 (*)    All other components within normal limits    Imaging Review Dg Chest 2 View  11/26/2015  CLINICAL DATA:  Both summed edema for 1 week.  Shortness of breath. EXAM: CHEST   2 VIEW COMPARISON:  10/19/2015 FINDINGS: There is no focal parenchymal opacity. There is a trace right pleural effusion. There is no pneumothorax. The heart and mediastinal contours are unremarkable. The osseous structures are unremarkable. IMPRESSION: Trace right pleural effusion. Electronically Signed   By: Kathreen Devoid   On: 11/26/2015 10:28   I have personally reviewed and evaluated these images and lab results as part of my  medical decision-making.   EKG Interpretation   Date/Time:  Friday November 26 2015 09:42:06 EST Ventricular Rate:  53 PR Interval:  201 QRS Duration: 101 QT Interval:  474 QTC Calculation: 445 R Axis:   29 Text Interpretation:  Sinus rhythm Abnormal T, consider ischemia, diffuse  leads Minimal ST elevation, anterior leads  rate decreased from previous  EKG. Confirmed by Alvino Chapel  MD, Ovid Curd [phone]) on 11/26/2015 10:03:29 AM      MDM   Final diagnoses:  Peripheral edema  Alcoholic cirrhosis of liver with ascites ([place])    Patient with shortness of breath and leg swelling. Sent in by gastroenterology for admission. Has put on around 30 pounds in the last 2 weeks. History of cirrhosis. Does not appear to have respiratory involvement. Admit to internal medicine. I personally performed the services described in this documentation, which was scribed in my presence. The recorded information has been reviewed and is accurate.       Davonna Belling, MD 11/26/15 1535

## 2015-11-26 NOTE — ED Notes (Signed)
Pt reports SOB and swelling in both legs from thighs down x 1 week.

## 2015-11-26 NOTE — H&P (Signed)
[facility]          History and Physical    PCP:   Rosita Fire, MD   EDP: Davonna Belling, M.D.  Chief Complaint:  Lower extremity edema  HPI: Patient is a 60 year old lady with history of alcoholic cirrhosis who was sent to the hospital today after being seen in the office today by Dr. Laural Golden for increasing edema and weight gain. She has a documented 30 pound weight gain in the past 2 months. She is very diet noncompliant. She denies shortness of breath. She is sent to the hospital for diuresis. She is concerned about the amount of stools she is having a day and wonders if she can cut down her lactulose dose. She states she has been compliant with increased dose of Lasix to 80 mg daily that was instituted on the 24th. We are asked to admit her for further evaluation and management.  Allergies:   Allergies  Allergen Reactions  . Latex Swelling    Discoloration of skin.   . Vicodin [Hydrocodone-Acetaminophen] Itching  . Tylenol [Acetaminophen] Palpitations    Says doctor told her not to take       Past Medical History  Diagnosis Date  . Hypertension   . Brain aneurysm   . Rotator cuff syndrome of left shoulder   . DDD (degenerative disc disease), lumbar   . Chronic back pain Diverticultis  . Cirrhosis ([place])     alcoholic  . Sleep apnea   . ETOH abuse   . Bilateral leg edema   . Hepatomegaly     scope 2014  . Diverticulosis     scope 2014  . Cancer [facility])     breast cancer    Past Surgical History  Procedure Laterality Date  . Cholecystectomy    . Brain surgery    . Breast surgery    . Total knee arthroplasty      right. 2002  . Cataract extraction Bilateral     [facility] 2 or 3 years ago  . Colonoscopy N/A 04/10/2013    Procedure: COLONOSCOPY;  Surgeon: Rogene Houston, MD;  [facility];  Service: Endoscopy;  Laterality: N/A;  1030-rescheduled to 1200 Ann notified pt  . Esophagogastroduodenoscopy N/A 04/22/2015    Procedure:  ESOPHAGOGASTRODUODENOSCOPY (EGD);  Surgeon: Rogene Houston, MD;  [facility];  Service: Endoscopy;  Laterality: N/A;  . Radiology with anesthesia N/A 07/16/2015    Procedure: TIPS;  Surgeon: Medication Radiologist, MD;  Location: [place];  Service: Radiology;  Laterality: N/A;    Prior to Admission medications   Medication Sig Start Date End Date Taking? Authorizing Provider  folic acid (FOLVITE) 1 MG tablet Take 1 tablet (1 mg total) by mouth daily. 04/29/15  Yes Rosita Fire, MD  furosemide (LASIX) 40 MG tablet Take 80 mg by mouth daily. 11/17/15  Yes Historical Provider, MD  lactulose (CHRONULAC) 10 GM/15ML solution Take 22.5 mLs (15 g total) by mouth 4 (four) times daily. Four times a day 11/23/15  Yes Rogene Houston, MD  nadolol (CORGARD) 20 MG tablet Take 10 mg by mouth daily.  11/02/15  Yes Historical Provider, MD  rifaximin (XIFAXAN) 550 MG TABS tablet Take 550 mg by mouth 2 (two) times daily.   Yes Jacqulynn Cadet, MD  thiamine 100 MG tablet Take 1 tablet (100 mg total) by mouth daily. 04/29/15  Yes Rosita Fire, MD  Social History:  reports that she has never smoked. She has never used smokeless tobacco. She reports that she does not drink alcohol or use illicit drugs.  Family history: No Diabetes or hypertension in family members  Review of Systems:  Constitutional: Denies fever, chills, diaphoresis, appetite change and fatigue.  HEENT: Denies photophobia, eye pain, redness, hearing loss, ear pain, congestion, sore throat, rhinorrhea, sneezing, mouth sores, trouble swallowing, neck pain, neck stiffness and tinnitus.   Respiratory: Denies SOB, DOE, cough, chest tightness,  and wheezing.   Cardiovascular: Denies chest pain, palpitations  Gastrointestinal: Denies nausea, vomiting, abdominal pain,  constipation, blood in stool and abdominal distention.  Genitourinary: Denies dysuria, urgency, frequency, hematuria, flank pain and difficulty urinating.  Endocrine: Denies:  hot or cold intolerance, sweats, changes in hair or nails, polyuria, polydipsia. Musculoskeletal: Denies myalgias, back pain, joint swelling, arthralgias and gait problem.  Skin: Denies pallor, rash and wound.  Neurological: Denies dizziness, seizures, syncope, weakness, light-headedness, numbness and headaches.  Hematological: Denies adenopathy. Easy bruising, personal or family bleeding history  Psychiatric/Behavioral: Denies suicidal ideation, mood changes, confusion, nervousness, sleep disturbance and agitation   Physical Exam: Blood pressure 154/85, pulse 56, temperature 97.5 F (36.4 C), temperature source Oral, resp. rate 20, height '5\' 3"'$  (1.6 m), weight 80.559 kg (177 lb 9.6 oz), SpO2 100 %.  general: Alert, awake, oriented 3 HEENT: Normocephalic, atraumatic, pupils equal round and reactive to light Neck: Supple, no JVD, no lymphadenopathy, no bruits, no goiter Cardiovascular: Regular rate and rhythm, no murmurs, rubs or gallops Lungs: Clear to auscultation bilaterally Abdomen: Obese, distended, positive bowel sounds Extremities: 3-4+ pitting edema up to upper thighs bilaterally Neurologic: Grossly intact and nonfocal  Labs on Admission:  Results for orders placed or performed during the hospital encounter of 11/26/15 (from the past 48 hour(s))  Comprehensive metabolic panel     Status: Abnormal   Collection Time: 11/26/15 10:16 AM  Result Value Ref Range   Sodium 141 135 - 145 mmol/L   Potassium 3.8 3.5 - 5.1 mmol/L   Chloride 113 (H) 101 - 111 mmol/L   CO2 22 22 - 32 mmol/L   Glucose, Bld 104 (H) 65 - 99 mg/dL   BUN 34 (H) 6 - 20 mg/dL   Creatinine, Ser 1.50 (H) 0.44 - 1.00 mg/dL   Calcium 7.9 (L) 8.9 - 10.3 mg/dL   Total Protein 4.3 (L) 6.5 - 8.1 g/dL   Albumin 1.7 (L) 3.5 - 5.0 g/dL   AST 54 (H) 15 - 41 U/L   ALT 18 14 - 54 U/L   Alkaline Phosphatase 175 (H) 38 - 126 U/L   Total Bilirubin 1.2 0.3 - 1.2 mg/dL   GFR calc non Af Amer 37 (L) >60 mL/min   GFR calc Af  Amer 43 (L) >60 mL/min    Comment: (NOTE) The eGFR has been calculated using the CKD EPI equation. This calculation has not been validated in all clinical situations. eGFR's persistently <60 mL/min signify possible Chronic Kidney Disease.    Anion gap 6 5 - 15  Protime-INR     Status: Abnormal   Collection Time: 11/26/15 10:16 AM  Result Value Ref Range   Prothrombin Time 17.7 (H) 11.6 - 15.2 seconds   INR 1.45 0.00 - 1.49  CBC with Differential     Status: Abnormal   Collection Time: 11/26/15 10:16 AM  Result Value Ref Range   WBC 6.7 4.0 - 10.5 K/uL   RBC 3.13 (L) 3.87 - 5.11 MIL/uL  Hemoglobin 8.6 (L) 12.0 - 15.0 g/dL   HCT 25.2 (L) 36.0 - 46.0 %   MCV 80.5 78.0 - 100.0 fL   MCH 27.5 26.0 - 34.0 pg   MCHC 34.1 30.0 - 36.0 g/dL   RDW 17.1 (H) 11.5 - 15.5 %   Platelets 162 150 - 400 K/uL   Neutrophils Relative % 41 %   Neutro Abs 2.8 1.7 - 7.7 K/uL   Lymphocytes Relative 32 %   Lymphs Abs 2.1 0.7 - 4.0 K/uL   Monocytes Relative 16 %   Monocytes Absolute 1.0 0.1 - 1.0 K/uL   Eosinophils Relative 8 %   Eosinophils Absolute 0.5 0.0 - 0.7 K/uL   Basophils Relative 3 %   Basophils Absolute 0.2 (H) 0.0 - 0.1 K/uL    Radiological Exams on Admission: Dg Chest 2 View  11/26/2015  CLINICAL DATA:  Both summed edema for 1 week.  Shortness of breath. EXAM: CHEST  2 VIEW COMPARISON:  10/19/2015 FINDINGS: There is no focal parenchymal opacity. There is a trace right pleural effusion. There is no pneumothorax. The heart and mediastinal contours are unremarkable. The osseous structures are unremarkable. IMPRESSION: Trace right pleural effusion. Electronically Signed   By: Kathreen Devoid   On: 11/26/2015 10:28    Assessment/Plan Principal Problem:   Anasarca Active Problems:   Hypertension   Cirrhosis, alcoholic (HCC)   ETOH abuse   Stage III chronic kidney disease   Peripheral edema    Alcoholic cirrhosis with anasarca -As per GI recommendations, will admit to the hospital and  place on 40 mg of IV Lasix twice daily with IV albumin daily with hopes to augment diuresis. -We'll continue rifaximin and lactulose to prevent hepatic encephalopathy which she has had in the past. -Is not on spironolactone due to prior history of hyperkalemia.   chronic kidney disease stage III -Creatinine is at baseline of between 1.4 and 1.5.  Alcohol abuse -Thiamine/folate.  DVT prophylaxis -SCDs  CODE STATUS -Full code   Time Spent on Admission:  85 minutes  HERNANDEZ ACOSTA,ESTELA [facility] Pager: [phone] 11/26/2015, 4:26 PM

## 2015-11-26 NOTE — Patient Outreach (Signed)
11/26/15- Telephone call to patient to close out transition of care, no answer to telephone, left voicemail requesting return phone call.  Jacqlyn Larsen [facility], [place] Coordinator [phone]

## 2015-11-26 NOTE — Plan of Care (Signed)
Problem: Food- and Nutrition-Related Knowledge Deficit (NB-1.1) Goal: Nutrition education Formal process to instruct or train a patient/client in a skill or to impart knowledge to help patients/clients voluntarily manage or modify food choices and eating behavior to maintain or improve health. Outcome: Completed/Met Date Met:  11/26/15 Nutrition Education Note  RD consulted for nutrition education regarding low sodium diet   RD provided "Low Sodium Nutrition Therapy" handout from the Academy [employer]. Reviewed patient's dietary recall. Pt reports:  Breakfast: Oatmeal or cold cereal. No bacon/sausage Lunch: Salad or skips Dinner: Chicken, pork chops, beef fried or baked. Gets her meats fresh Other: Eats out only 1x each month. Does admit to eating frozen pizzas. Gets canned beans. Does not eat canned soup. Eats a lot of cheese. She does have a small salt shaker on her table  Unsure if pt was being transparent with her diet. On one occasion after RD asked if she ate a certain food, she first asked "Is that food bad" and once RD responded "yes" she then said that she didn't eat that specific food.  She makes her own soup and biscuits.  The big problem foods she admitted to eating were the cheese and the frozen pizzas. Educated pt that frozen pizzas, and frozen meals in general, have some of the highest sodium content of all foods. Advised dramatically reducing intake/portion size. Also  reccommended cutting down on intake of cheese and it is also high in sodium  Pt states she reads labels and looks at the sodium content. RD advised her to also look at the portion size listed and went through some examples of how to multiply mg of sodium by the number of portions consumed. Gave pt goal of 2g/day  Encouraged fresh fruits and vegetables. Explained that she should rinse her canned vegetables off to reduce sodium. .   Expect Fair compliance. As stated above, I do not believe she was  being completely transparent and was telling me things she knew were good. That said, atleast she does know  many foods that are high/low in sodium; it is about motivating her to make the right choices.   Body mass index is 31.47 kg/(m^2). Pt meets criteria for Obese based on current BMI.     RD, LDN Nutrition Pager: [phone] 11/26/2015 5:26 PM        

## 2015-11-26 NOTE — Care Management Note (Signed)
Case Management Note  Patient Details  Name: Savannah Jackson MRN: [id number] Date of Birth: August 16, 1956  Subjective/Objective:             Spoke with patient who is alert and oriented from home with husband. Patient stated that she does well at home and that she ambulates without the assistance of a walker or cane. She stated that she drives herself. Patient further stated that she can afford her medications.   She is proactive in her self care and stated that she weighs herself daily.      Action/Plan: Home with self care.  May need RN visits if anything.   Expected Discharge Date:  11/30/15               Expected Discharge Plan:  Home/Self Care  In-House Referral:     Discharge planning Services  CM Consult  Post Acute Care Choice:    Choice offered to:     DME Arranged:    DME Agency:     HH Arranged:    HH Agency:     Status of Service:  Completed, signed off  Medicare Important Message Given:    Date Medicare IM Given:    Medicare IM give by:    Date Additional Medicare IM Given:    Additional Medicare Important Message give by:     If discussed at [place] of Stay Meetings, dates discussed:    Additional Comments:  Alvie Heidelberg, RN 11/26/2015, 4:09 PM

## 2015-11-27 DIAGNOSIS — K7030 Alcoholic cirrhosis of liver without ascites: Principal | ICD-10-CM

## 2015-11-27 DIAGNOSIS — R601 Generalized edema: Secondary | ICD-10-CM

## 2015-11-27 DIAGNOSIS — D638 Anemia in other chronic diseases classified elsewhere: Secondary | ICD-10-CM | POA: Insufficient documentation

## 2015-11-27 DIAGNOSIS — R609 Edema, unspecified: Secondary | ICD-10-CM

## 2015-11-27 LAB — CBC
HEMATOCRIT: 25.3 % — AB (ref 36.0–46.0)
Hemoglobin: 8.5 g/dL — ABNORMAL LOW (ref 12.0–15.0)
MCH: 27.2 pg (ref 26.0–34.0)
MCHC: 33.6 g/dL (ref 30.0–36.0)
MCV: 81.1 fL (ref 78.0–100.0)
PLATELETS: 154 10*3/uL (ref 150–400)
RBC: 3.12 MIL/uL — ABNORMAL LOW (ref 3.87–5.11)
RDW: 17.3 % — AB (ref 11.5–15.5)
WBC: 6.3 10*3/uL (ref 4.0–10.5)

## 2015-11-27 LAB — SODIUM, URINE, RANDOM: SODIUM UR: 31 mmol/L

## 2015-11-27 LAB — COMPREHENSIVE METABOLIC PANEL
ALBUMIN: 1.5 g/dL — AB (ref 3.5–5.0)
ALK PHOS: 153 U/L — AB (ref 38–126)
ALT: 17 U/L (ref 14–54)
AST: 48 U/L — AB (ref 15–41)
Anion gap: 6 (ref 5–15)
BILIRUBIN TOTAL: 1.1 mg/dL (ref 0.3–1.2)
BUN: 33 mg/dL — AB (ref 6–20)
CALCIUM: 7.6 mg/dL — AB (ref 8.9–10.3)
CO2: 21 mmol/L — ABNORMAL LOW (ref 22–32)
CREATININE: 1.59 mg/dL — AB (ref 0.44–1.00)
Chloride: 115 mmol/L — ABNORMAL HIGH (ref 101–111)
GFR calc Af Amer: 40 mL/min — ABNORMAL LOW (ref >60)
GFR, EST NON AFRICAN AMERICAN: 34 mL/min — AB (ref >60)
GLUCOSE: 94 mg/dL (ref 65–99)
Potassium: 3.2 mmol/L — ABNORMAL LOW (ref 3.5–5.1)
Sodium: 142 mmol/L (ref 135–145)
TOTAL PROTEIN: 3.9 g/dL — AB (ref 6.5–8.1)

## 2015-11-27 MED ORDER — SPIRONOLACTONE 25 MG PO TABS
25.0000 mg | ORAL_TABLET | Freq: Two times a day (BID) | ORAL | Status: DC
  Administered 2015-11-27: 25 mg via ORAL
  Filled 2015-11-27: qty 1

## 2015-11-27 NOTE — Progress Notes (Signed)
Subjective:  Patient complains of lower extremity edema. No other complaints.   Objective: Vital signs in last 24 hours: Temp:  [97.5 F (36.4 C)-98.3 F (36.8 C)] 98.3 F (36.8 C) (01/28 0533) Pulse Rate:  [52-63] 62 (01/28 0533) Resp:  [17-22] 20 (01/28 0533) BP: (118-154)/(65-91) 118/74 mmHg (01/28 0533) SpO2:  [98 %-100 %] 100 % (01/28 0533) Weight:  [176 lb 1.6 oz (79.878 kg)-187 lb (84.823 kg)] 176 lb 1.6 oz (79.878 kg) (01/28 0533) Last BM Date: 11/26/15 General:   Alert,  Well-developed, well-nourished, pleasant and cooperative in NAD Head:  Normocephalic and atraumatic. Eyes:  Sclera clear, no icterus.  Chest: CTA bilaterally without rales, rhonchi, crackles.    Heart:  Regular rate and rhythm; no murmurs, clicks, rubs,  or gallops. Abdomen:  Soft, nontender and nondistended. No masses, hepatosplenomegaly or hernias noted. Normal bowel sounds, without guarding, and without rebound.   Extremities:  Without clubbing, deformity. 2-3+ pitting edema to knees bilaterally. Neurologic:  Alert and  oriented x4;  grossly normal neurologically. No asterixis. Skin:  Intact without significant lesions or rashes. Psych:  Alert and cooperative. Normal mood and affect.  Intake/Output from previous day: 01/27 0701 - 01/28 0700 In: 240 [P.O.:240] Out: -  Intake/Output this shift:    Lab Results: CBC  Recent Labs  11/26/15 1016 11/27/15 0639  WBC 6.7 6.3  HGB 8.6* 8.5*  HCT 25.2* 25.3*  MCV 80.5 81.1  PLT 162 154   BMET  Recent Labs  11/26/15 1016 11/27/15 0639  NA 141 142  K 3.8 3.2*  CL 113* 115*  CO2 22 21*  GLUCOSE 104* 94  BUN 34* 33*  CREATININE 1.50* 1.59*  CALCIUM 7.9* 7.6*   LFTs  Recent Labs  11/26/15 1016 11/27/15 0639  BILITOT 1.2 1.1  ALKPHOS 175* 153*  AST 54* 48*  ALT 18 17  PROT 4.3* 3.9*  ALBUMIN 1.7* 1.5*   No results for input(s): LIPASE in the last 72 hours. PT/INR  Recent Labs  11/26/15 1016  LABPROT 17.7*  INR 1.45       Imaging Studies: Dg Chest 2 View  11/26/2015  CLINICAL DATA:  Both summed edema for 1 week.  Shortness of breath. EXAM: CHEST  2 VIEW COMPARISON:  10/19/2015 FINDINGS: There is no focal parenchymal opacity. There is a trace right pleural effusion. There is no pneumothorax. The heart and mediastinal contours are unremarkable. The osseous structures are unremarkable. IMPRESSION: Trace right pleural effusion. 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 Has had increasing difficulty with fluid overload over the past couple months, gaining 40 pounds. No evidence of ascites on ultrasound back in October. Diuretic regimens have been adjusted along the way, unfortunately she developed hyperkalemia and did not tolerate Aldactone. She did have outpatient IV albumin as well. Because of ongoing edema, complaints of shortness of breath and difficulty walking, admission was recommended.  Since admission her weight is down 10 pounds.  Chronic anemia. No signs of overt GI bleeding. EGD in June 2016 with grade 1 esophageal varices and portal gastropathy, questionable bleed from Mallory-Weiss tear which had healed at the time of examination. Colonoscopy June 2014 with pancolonic diverticulosis and for tubular adenomas removed.  Plan: 1. 2g sodium diet. Appreciate dietary consultation.  2. Continue albumin/lasix/aldactone. 3. Daily weights.  4. Daily met-7. 5. Fluid restriction to 1.5L per day. 6. F/u urinary sodium. 7. Abdominal ultrasound with doppler on 11/29/2015. 8. Will continue to follow with you.  Laureen Ochs. Bernarda Caffey [facility] [phone] 1/28/201711:37 AM     LOS: 1 day

## 2015-11-27 NOTE — Progress Notes (Signed)
Subjective: Patient is admitted due to anasarca due end stage liver cirrhosis. She was evaluated by Dr. Laural Golden who suggested admission for IV diuresis   Objective: Vital signs in last 24 hours: Temp:  [97.5 F (36.4 C)-98.3 F (36.8 C)] 98.3 F (36.8 C) (01/28 0533) Pulse Rate:  [52-63] 62 (01/28 0533) Resp:  [17-21] 20 (01/28 0533) BP: (118-154)/(65-91) 118/74 mmHg (01/28 0533) SpO2:  [98 %-100 %] 100 % (01/28 0533) Weight:  [79.878 kg (176 lb 1.6 oz)-80.559 kg (177 lb 9.6 oz)] 79.878 kg (176 lb 1.6 oz) (01/28 0533) Weight change:  Last BM Date: 11/26/15  Intake/Output from previous day: 01/27 0701 - 01/28 0700 In: 240 [P.O.:240] Out: -   PHYSICAL EXAM General appearance: alert and no distress Resp: diminished breath sounds bilaterally and rhonchi bilaterally Cardio: S1, S2 normal GI: abdomen is full, bowel sound is ++, no area of tenderness Extremities: anasarca  Lab Results:  Results for orders placed or performed during the hospital encounter of 11/26/15 (from the past 48 hour(s))  Comprehensive metabolic panel     Status: Abnormal   Collection Time: 11/26/15 10:16 AM  Result Value Ref Range   Sodium 141 135 - 145 mmol/L   Potassium 3.8 3.5 - 5.1 mmol/L   Chloride 113 (H) 101 - 111 mmol/L   CO2 22 22 - 32 mmol/L   Glucose, Bld 104 (H) 65 - 99 mg/dL   BUN 34 (H) 6 - 20 mg/dL   Creatinine, Ser 1.50 (H) 0.44 - 1.00 mg/dL   Calcium 7.9 (L) 8.9 - 10.3 mg/dL   Total Protein 4.3 (L) 6.5 - 8.1 g/dL   Albumin 1.7 (L) 3.5 - 5.0 g/dL   AST 54 (H) 15 - 41 U/L   ALT 18 14 - 54 U/L   Alkaline Phosphatase 175 (H) 38 - 126 U/L   Total Bilirubin 1.2 0.3 - 1.2 mg/dL   GFR calc non Af Amer 37 (L) >60 mL/min   GFR calc Af Amer 43 (L) >60 mL/min    Comment: (NOTE) The eGFR has been calculated using the CKD EPI equation. This calculation has not been validated in all clinical situations. eGFR's persistently <60 mL/min signify possible Chronic Kidney Disease.    Anion gap 6 5 -  15  Protime-INR     Status: Abnormal   Collection Time: 11/26/15 10:16 AM  Result Value Ref Range   Prothrombin Time 17.7 (H) 11.6 - 15.2 seconds   INR 1.45 0.00 - 1.49  CBC with Differential     Status: Abnormal   Collection Time: 11/26/15 10:16 AM  Result Value Ref Range   WBC 6.7 4.0 - 10.5 K/uL   RBC 3.13 (L) 3.87 - 5.11 MIL/uL   Hemoglobin 8.6 (L) 12.0 - 15.0 g/dL   HCT 25.2 (L) 36.0 - 46.0 %   MCV 80.5 78.0 - 100.0 fL   MCH 27.5 26.0 - 34.0 pg   MCHC 34.1 30.0 - 36.0 g/dL   RDW 17.1 (H) 11.5 - 15.5 %   Platelets 162 150 - 400 K/uL   Neutrophils Relative % 41 %   Neutro Abs 2.8 1.7 - 7.7 K/uL   Lymphocytes Relative 32 %   Lymphs Abs 2.1 0.7 - 4.0 K/uL   Monocytes Relative 16 %   Monocytes Absolute 1.0 0.1 - 1.0 K/uL   Eosinophils Relative 8 %   Eosinophils Absolute 0.5 0.0 - 0.7 K/uL   Basophils Relative 3 %   Basophils Absolute 0.2 (H) 0.0 - 0.1  K/uL  Comprehensive metabolic panel     Status: Abnormal   Collection Time: 11/27/15  6:39 AM  Result Value Ref Range   Sodium 142 135 - 145 mmol/L   Potassium 3.2 (L) 3.5 - 5.1 mmol/L   Chloride 115 (H) 101 - 111 mmol/L   CO2 21 (L) 22 - 32 mmol/L   Glucose, Bld 94 65 - 99 mg/dL   BUN 33 (H) 6 - 20 mg/dL   Creatinine, Ser 1.59 (H) 0.44 - 1.00 mg/dL   Calcium 7.6 (L) 8.9 - 10.3 mg/dL   Total Protein 3.9 (L) 6.5 - 8.1 g/dL   Albumin 1.5 (L) 3.5 - 5.0 g/dL   AST 48 (H) 15 - 41 U/L   ALT 17 14 - 54 U/L   Alkaline Phosphatase 153 (H) 38 - 126 U/L   Total Bilirubin 1.1 0.3 - 1.2 mg/dL   GFR calc non Af Amer 34 (L) >60 mL/min   GFR calc Af Amer 40 (L) >60 mL/min    Comment: (NOTE) The eGFR has been calculated using the CKD EPI equation. This calculation has not been validated in all clinical situations. eGFR's persistently <60 mL/min signify possible Chronic Kidney Disease.    Anion gap 6 5 - 15  CBC     Status: Abnormal   Collection Time: 11/27/15  6:39 AM  Result Value Ref Range   WBC 6.3 4.0 - 10.5 K/uL   RBC 3.12  (L) 3.87 - 5.11 MIL/uL   Hemoglobin 8.5 (L) 12.0 - 15.0 g/dL   HCT 25.3 (L) 36.0 - 46.0 %   MCV 81.1 78.0 - 100.0 fL   MCH 27.2 26.0 - 34.0 pg   MCHC 33.6 30.0 - 36.0 g/dL   RDW 17.3 (H) 11.5 - 15.5 %   Platelets 154 150 - 400 K/uL    ABGS No results for input(s): PHART, PO2ART, TCO2, HCO3 in the last 72 hours.  Invalid input(s): PCO2 CULTURES No results found for this or any previous visit (from the past 240 hour(s)). Studies/Results: Dg Chest 2 View  11/26/2015  CLINICAL DATA:  Both summed edema for 1 week.  Shortness of breath. EXAM: CHEST  2 VIEW COMPARISON:  10/19/2015 FINDINGS: There is no focal parenchymal opacity. There is a trace right pleural effusion. There is no pneumothorax. The heart and mediastinal contours are unremarkable. The osseous structures are unremarkable. IMPRESSION: Trace right pleural effusion. Electronically Signed   By: Kathreen Devoid   On: 11/26/2015 10:28    Medications: I have reviewed the patient's current medications.  Assesment:   Principal Problem:   Anasarca Active Problems:   Hypertension   Cirrhosis, alcoholic (HCC)   ETOH abuse   Stage III chronic kidney disease   Peripheral edema    Plan:  Medications reviewed Will continue IV lasix Will add aldactone 25 mg po BID Nephrology and GI consult    LOS: 1 day   Brielynn Sekula 11/27/2015, 10:26 AM

## 2015-11-28 DIAGNOSIS — K7030 Alcoholic cirrhosis of liver without ascites: Secondary | ICD-10-CM | POA: Insufficient documentation

## 2015-11-28 LAB — COMPREHENSIVE METABOLIC PANEL
ALBUMIN: 2 g/dL — AB (ref 3.5–5.0)
ALK PHOS: 118 U/L (ref 38–126)
ALT: 14 U/L (ref 14–54)
AST: 44 U/L — AB (ref 15–41)
Anion gap: 7 (ref 5–15)
BILIRUBIN TOTAL: 1.5 mg/dL — AB (ref 0.3–1.2)
BUN: 35 mg/dL — AB (ref 6–20)
CALCIUM: 7.9 mg/dL — AB (ref 8.9–10.3)
CO2: 24 mmol/L (ref 22–32)
CREATININE: 1.48 mg/dL — AB (ref 0.44–1.00)
Chloride: 111 mmol/L (ref 101–111)
GFR calc Af Amer: 43 mL/min — ABNORMAL LOW (ref >60)
GFR calc non Af Amer: 37 mL/min — ABNORMAL LOW (ref >60)
GLUCOSE: 92 mg/dL (ref 65–99)
Potassium: 3.2 mmol/L — ABNORMAL LOW (ref 3.5–5.1)
Sodium: 142 mmol/L (ref 135–145)
TOTAL PROTEIN: 4.2 g/dL — AB (ref 6.5–8.1)

## 2015-11-28 LAB — CREATININE, URINE, RANDOM: Creatinine, Urine: 129.48 mg/dL

## 2015-11-28 LAB — SODIUM, URINE, RANDOM: Sodium, Ur: 26 mmol/L

## 2015-11-28 MED ORDER — FUROSEMIDE 10 MG/ML IJ SOLN
80.0000 mg | Freq: Two times a day (BID) | INTRAMUSCULAR | Status: DC
Start: 2015-11-28 — End: 2015-11-29
  Administered 2015-11-28: 80 mg via INTRAVENOUS
  Filled 2015-11-28: qty 8

## 2015-11-28 MED ORDER — SPIRONOLACTONE 25 MG PO TABS
50.0000 mg | ORAL_TABLET | Freq: Every day | ORAL | Status: DC
  Administered 2015-11-28 – 2015-12-01 (×4): 50 mg via ORAL
  Filled 2015-11-28 (×4): qty 2

## 2015-11-28 MED ORDER — ALBUMIN HUMAN 25 % IV SOLN
25.0000 g | Freq: Two times a day (BID) | INTRAVENOUS | Status: DC
  Administered 2015-11-28 – 2015-11-30 (×6): 25 g via INTRAVENOUS
  Filled 2015-11-28 (×9): qty 100

## 2015-11-28 MED ORDER — DIPHENHYDRAMINE HCL 25 MG PO CAPS
25.0000 mg | ORAL_CAPSULE | Freq: Four times a day (QID) | ORAL | Status: DC | PRN
  Administered 2015-11-28: 25 mg via ORAL
  Filled 2015-11-28: qty 1

## 2015-11-28 MED ORDER — LACTULOSE 10 GM/15ML PO SOLN
10.0000 g | Freq: Three times a day (TID) | ORAL | Status: DC
  Administered 2015-11-28 – 2015-12-01 (×10): 10 g via ORAL
  Filled 2015-11-28 (×10): qty 30

## 2015-11-28 MED ORDER — POTASSIUM CHLORIDE CRYS ER 20 MEQ PO TBCR
40.0000 meq | EXTENDED_RELEASE_TABLET | Freq: Two times a day (BID) | ORAL | Status: DC
  Administered 2015-11-28 (×2): 40 meq via ORAL
  Filled 2015-11-28 (×2): qty 2

## 2015-11-28 NOTE — Progress Notes (Signed)
Subjective: Patient is resting. She is on combinations of diuretics but her anasarca is not significantly improved.  Her urine out is not pro[perly documented.   Objective: Vital signs in last 24 hours: Temp:  [97.5 F (36.4 C)-97.8 F (36.6 C)] 97.6 F (36.4 C) (01/29 0500) Pulse Rate:  [57-65] 59 (01/29 0500) Resp:  [18-20] 18 (01/29 0500) BP: (123-141)/(75-91) 123/75 mmHg (01/28 2200) SpO2:  [99 %-100 %] 99 % (01/29 0500) Weight:  [80.287 kg (177 lb)-80.5 kg (177 lb 7.5 oz)] 80.287 kg (177 lb) (01/29 0918) Weight change: -4.323 kg (-9 lb 8.5 oz) Last BM Date: 11/27/15  Intake/Output from previous day: 01/28 0701 - 01/29 0700 In: 920 [P.O.:720; IV Piggyback:200] Out: -   PHYSICAL EXAM General appearance: alert and no distress Resp: diminished breath sounds bilaterally and rhonchi bilaterally Cardio: S1, S2 normal GI: abdomen is full, bowel sound is ++, no area of tenderness Extremities: anasarca  Lab Results:  Results for orders placed or performed during the hospital encounter of 11/26/15 (from the past 48 hour(s))  Comprehensive metabolic panel     Status: Abnormal   Collection Time: 11/27/15  6:39 AM  Result Value Ref Range   Sodium 142 135 - 145 mmol/L   Potassium 3.2 (L) 3.5 - 5.1 mmol/L   Chloride 115 (H) 101 - 111 mmol/L   CO2 21 (L) 22 - 32 mmol/L   Glucose, Bld 94 65 - 99 mg/dL   BUN 33 (H) 6 - 20 mg/dL   Creatinine, Ser 1.59 (H) 0.44 - 1.00 mg/dL   Calcium 7.6 (L) 8.9 - 10.3 mg/dL   Total Protein 3.9 (L) 6.5 - 8.1 g/dL   Albumin 1.5 (L) 3.5 - 5.0 g/dL   AST 48 (H) 15 - 41 U/L   ALT 17 14 - 54 U/L   Alkaline Phosphatase 153 (H) 38 - 126 U/L   Total Bilirubin 1.1 0.3 - 1.2 mg/dL   GFR calc non Af Amer 34 (L) >60 mL/min   GFR calc Af Amer 40 (L) >60 mL/min    Comment: (NOTE) The eGFR has been calculated using the CKD EPI equation. This calculation has not been validated in all clinical situations. eGFR's persistently <60 mL/min signify possible Chronic  Kidney Disease.    Anion gap 6 5 - 15  CBC     Status: Abnormal   Collection Time: 11/27/15  6:39 AM  Result Value Ref Range   WBC 6.3 4.0 - 10.5 K/uL   RBC 3.12 (L) 3.87 - 5.11 MIL/uL   Hemoglobin 8.5 (L) 12.0 - 15.0 g/dL   HCT 25.3 (L) 36.0 - 46.0 %   MCV 81.1 78.0 - 100.0 fL   MCH 27.2 26.0 - 34.0 pg   MCHC 33.6 30.0 - 36.0 g/dL   RDW 17.3 (H) 11.5 - 15.5 %   Platelets 154 150 - 400 K/uL  Sodium, urine, random     Status: None   Collection Time: 11/27/15  2:35 PM  Result Value Ref Range   Sodium, Ur 31 mmol/L  Comprehensive metabolic panel     Status: Abnormal   Collection Time: 11/28/15  4:38 AM  Result Value Ref Range   Sodium 142 135 - 145 mmol/L   Potassium 3.2 (L) 3.5 - 5.1 mmol/L   Chloride 111 101 - 111 mmol/L   CO2 24 22 - 32 mmol/L   Glucose, Bld 92 65 - 99 mg/dL   BUN 35 (H) 6 - 20 mg/dL   Creatinine, Ser 1.48 (  H) 0.44 - 1.00 mg/dL   Calcium 7.9 (L) 8.9 - 10.3 mg/dL   Total Protein 4.2 (L) 6.5 - 8.1 g/dL   Albumin 2.0 (L) 3.5 - 5.0 g/dL   AST 44 (H) 15 - 41 U/L   ALT 14 14 - 54 U/L   Alkaline Phosphatase 118 38 - 126 U/L   Total Bilirubin 1.5 (H) 0.3 - 1.2 mg/dL   GFR calc non Af Amer 37 (L) >60 mL/min   GFR calc Af Amer 43 (L) >60 mL/min    Comment: (NOTE) The eGFR has been calculated using the CKD EPI equation. This calculation has not been validated in all clinical situations. eGFR's persistently <60 mL/min signify possible Chronic Kidney Disease.    Anion gap 7 5 - 15    ABGS No results for input(s): PHART, PO2ART, TCO2, HCO3 in the last 72 hours.  Invalid input(s): PCO2 CULTURES No results found for this or any previous visit (from the past 240 hour(s)). Studies/Results: No results found.  Medications: I have reviewed the patient's current medications.  Assesment:   Principal Problem:   Anasarca Active Problems:   Hypertension   Cirrhosis, alcoholic (HCC)   ETOH abuse   Stage III chronic kidney disease   Peripheral edema   Anemia  of chronic disease   Alcoholic cirrhosis of liver without ascites ([place])    Plan:  Medications reviewed Will continue IV lasix Nephrology and GI consult appreciated  Will monitor bmp    LOS: 2 days   Savannah Jackson 11/28/2015, 10:25 AM

## 2015-11-28 NOTE — Progress Notes (Signed)
Subjective:  Patient denies improvement in lower extremity edema. No other complaints except for appetite poor. No N/V. No abdominal pain. 5-6 stools in last 24 hours.   Objective: Vital signs in last 24 hours: Temp:  [97.5 F (36.4 C)-97.8 F (36.6 C)] 97.6 F (36.4 C) (01/29 0500) Pulse Rate:  [57-65] 59 (01/29 0500) Resp:  [18-20] 18 (01/29 0500) BP: (123-141)/(75-91) 123/75 mmHg (01/28 2200) SpO2:  [99 %-100 %] 99 % (01/29 0500) Weight:  [177 lb (80.287 kg)-177 lb 7.5 oz (80.5 kg)] 177 lb (80.287 kg) (01/29 0918) Last BM Date: 11/27/15 General:   Alert,  Well-developed, well-nourished, pleasant and cooperative in NAD Head:  Normocephalic and atraumatic. Eyes:  Sclera clear, no icterus.  Abdomen:  Soft, nontender and nondistended.   Normal bowel sounds, without guarding, and without rebound.   Extremities:  2-3+ pitting edema to knees bilaterally, 1+ to thighs/abd in dependent areas. Without clubbing, deformity. Neurologic:  Alert and  oriented x4;  grossly normal neurologically. Skin:  Intact without significant lesions or rashes. Psych:  Alert and cooperative. Normal mood and affect.  Intake/Output from previous day: 01/28 0701 - 01/29 0700 In: 920 [P.O.:720; IV Piggyback:200] Out: -  Intake/Output this shift:    Lab Results: CBC  Recent Labs  11/26/15 1016 11/27/15 0639  WBC 6.7 6.3  HGB 8.6* 8.5*  HCT 25.2* 25.3*  MCV 80.5 81.1  PLT 162 154   BMET  Recent Labs  11/26/15 1016 11/27/15 0639 11/28/15 0438  NA 141 142 142  K 3.8 3.2* 3.2*  CL 113* 115* 111  CO2 22 21* 24  GLUCOSE 104* 94 92  BUN 34* 33* 35*  CREATININE 1.50* 1.59* 1.48*  CALCIUM 7.9* 7.6* 7.9*   LFTs  Recent Labs  11/26/15 1016 11/27/15 0639 11/28/15 0438  BILITOT 1.2 1.1 1.5*  ALKPHOS 175* 153* 118  AST 54* 48* 44*  ALT 18 17 14   PROT 4.3* 3.9* 4.2*  ALBUMIN 1.7* 1.5* 2.0*   No results for input(s): LIPASE in the last 72 hours. PT/INR  Recent Labs  11/26/15 1016   LABPROT 17.7*  INR 1.45      Imaging Studies: Dg Chest 2 View  11/26/2015  CLINICAL DATA:  Both summed edema for 1 week.  Shortness of breath. EXAM: CHEST  2 VIEW COMPARISON:  10/19/2015 FINDINGS: There is no focal parenchymal opacity. There is a trace right pleural effusion. There is no pneumothorax. The heart and mediastinal contours are unremarkable. The osseous structures are unremarkable. IMPRESSION: Trace right pleural effusion. 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 Has had increasing difficulty with fluid overload over the past couple months, gaining 40 pounds. No evidence of ascites on ultrasound back in October. Diuretic regimens have been adjusted along the way, unfortunately she developed hyperkalemia and did not tolerate Aldactone. She did have outpatient IV albumin as well. Because of ongoing edema, complaints of shortness of breath and difficulty walking, admission was recommended.  Since admission her weight has been unchanged in two days, Initial weight likely incorrect at 187 because 3 hours later she was recorded at 177lb.  Chronic anemia. No signs of overt GI bleeding. EGD in June 2016 with grade 1 esophageal varices and portal gastropathy, questionable bleed from Mallory-Weiss tear which had healed at the time of  examination. Colonoscopy June 2014 with pancolonic diverticulosis and for tubular adenomas removed.  Plan: 1. Appreciate Dr. Rhona Leavens input. Discussed plan with him today. Albumin changed to 25g IV BID. Lasix and aldactone increased. Potassium ordered.  2. Will decrease lactulose dosing, titrate to 2-3 soft stools daily.  3. Strict I/Os. Continue daily weights, standing scales.  4. Consider abdominal ultrasound with doppler on 11/29/15 as per Dr.  Laural Golden.  Covering for Dr. Hildred Laser. Dr. Laural Golden to resume care on 11/29/15.  Laureen Ochs. Bernarda Caffey [facility] [phone] 1/29/20179:40 AM     LOS: 2 days

## 2015-11-28 NOTE — Consult Note (Signed)
Reason for Consult: Anasarca and acute kidney injury Referring Physician: Dr. Jens Som is an 60 y.o. female.  HPI: She is a patient with history of hypertension, alcoholic liver cirrhosis, sleep apnea presently came with complaints of increased leg swelling for the last 1-1/2 weeks. Patient to see that she has been taking Lasix but it doesn't seem to be working. The last couple of days she has gained about 5 pounds. Overall within the last year she has gained about 30 pounds. Patient denies any difficulty breathing, orthopnea or paroxysmal nocturnal dyspnea. Patient also denies any previous history of renal failure, kidney stone. Patient is being followed by GI.  Past Medical History  Diagnosis Date  . Hypertension   . Brain aneurysm   . Rotator cuff syndrome of left shoulder   . DDD (degenerative disc disease), lumbar   . Chronic back pain Diverticultis  . Cirrhosis ([place])     alcoholic  . Sleep apnea   . ETOH abuse   . Bilateral leg edema   . Hepatomegaly     scope 2014  . Diverticulosis     scope 2014  . Cancer [facility])     breast cancer    Past Surgical History  Procedure Laterality Date  . Cholecystectomy    . Brain surgery    . Breast surgery    . Total knee arthroplasty      right. 2002  . Cataract extraction Bilateral     [facility] 2 or 3 years ago  . Colonoscopy N/A 04/10/2013    Procedure: COLONOSCOPY;  Surgeon: Rogene Houston, MD;  Location: [facility];  Service: Endoscopy;  Laterality: N/A;  1030-rescheduled to 1200 Ann notified pt  . Esophagogastroduodenoscopy N/A 04/22/2015    Procedure: ESOPHAGOGASTRODUODENOSCOPY (EGD);  Surgeon: Rogene Houston, MD;  Location[facility];  Service: Endoscopy;  Laterality: N/A;  . Radiology with anesthesia N/A 07/16/2015    Procedure: TIPS;  Surgeon: Medication Radiologist, MD;  Location: [facility];  Service: Radiology;  Laterality: N/A;    No family history on file.  Social History:  reports that she has never  smoked. She has never used smokeless tobacco. She reports that she does not drink alcohol or use illicit drugs.  Allergies:  Allergies  Allergen Reactions  . Latex Swelling    Discoloration of skin.   . Vicodin [Hydrocodone-Acetaminophen] Itching  . Tylenol [Acetaminophen] Palpitations    Says doctor told her not to take     Medications: I have reviewed the patient's current medications.  Results for orders placed or performed during the hospital encounter of 11/26/15 (from the past 48 hour(s))  Comprehensive metabolic panel     Status: Abnormal   Collection Time: 11/26/15 10:16 AM  Result Value Ref Range   Sodium 141 135 - 145 mmol/L   Potassium 3.8 3.5 - 5.1 mmol/L   Chloride 113 (H) 101 - 111 mmol/L   CO2 22 22 - 32 mmol/L   Glucose, Bld 104 (H) 65 - 99 mg/dL   BUN 34 (H) 6 - 20 mg/dL   Creatinine, Ser 1.50 (H) 0.44 - 1.00 mg/dL   Calcium 7.9 (L) 8.9 - 10.3 mg/dL   Total Protein 4.3 (L) 6.5 - 8.1 g/dL   Albumin 1.7 (L) 3.5 - 5.0 g/dL   AST 54 (H) 15 - 41 U/L   ALT 18 14 - 54 U/L   Alkaline Phosphatase 175 (H) 38 - 126 U/L   Total Bilirubin 1.2 0.3 -  1.2 mg/dL   GFR calc non Af Amer 37 (L) >60 mL/min   GFR calc Af Amer 43 (L) >60 mL/min    Comment: (NOTE) The eGFR has been calculated using the CKD EPI equation. This calculation has not been validated in all clinical situations. eGFR's persistently <60 mL/min signify possible Chronic Kidney Disease.    Anion gap 6 5 - 15  Protime-INR     Status: Abnormal   Collection Time: 11/26/15 10:16 AM  Result Value Ref Range   Prothrombin Time 17.7 (H) 11.6 - 15.2 seconds   INR 1.45 0.00 - 1.49  CBC with Differential     Status: Abnormal   Collection Time: 11/26/15 10:16 AM  Result Value Ref Range   WBC 6.7 4.0 - 10.5 K/uL   RBC 3.13 (L) 3.87 - 5.11 MIL/uL   Hemoglobin 8.6 (L) 12.0 - 15.0 g/dL   HCT 25.2 (L) 36.0 - 46.0 %   MCV 80.5 78.0 - 100.0 fL   MCH 27.5 26.0 - 34.0 pg   MCHC 34.1 30.0 - 36.0 g/dL   RDW 17.1 (H)  11.5 - 15.5 %   Platelets 162 150 - 400 K/uL   Neutrophils Relative % 41 %   Neutro Abs 2.8 1.7 - 7.7 K/uL   Lymphocytes Relative 32 %   Lymphs Abs 2.1 0.7 - 4.0 K/uL   Monocytes Relative 16 %   Monocytes Absolute 1.0 0.1 - 1.0 K/uL   Eosinophils Relative 8 %   Eosinophils Absolute 0.5 0.0 - 0.7 K/uL   Basophils Relative 3 %   Basophils Absolute 0.2 (H) 0.0 - 0.1 K/uL  Comprehensive metabolic panel     Status: Abnormal   Collection Time: 11/27/15  6:39 AM  Result Value Ref Range   Sodium 142 135 - 145 mmol/L   Potassium 3.2 (L) 3.5 - 5.1 mmol/L   Chloride 115 (H) 101 - 111 mmol/L   CO2 21 (L) 22 - 32 mmol/L   Glucose, Bld 94 65 - 99 mg/dL   BUN 33 (H) 6 - 20 mg/dL   Creatinine, Ser 1.59 (H) 0.44 - 1.00 mg/dL   Calcium 7.6 (L) 8.9 - 10.3 mg/dL   Total Protein 3.9 (L) 6.5 - 8.1 g/dL   Albumin 1.5 (L) 3.5 - 5.0 g/dL   AST 48 (H) 15 - 41 U/L   ALT 17 14 - 54 U/L   Alkaline Phosphatase 153 (H) 38 - 126 U/L   Total Bilirubin 1.1 0.3 - 1.2 mg/dL   GFR calc non Af Amer 34 (L) >60 mL/min   GFR calc Af Amer 40 (L) >60 mL/min    Comment: (NOTE) The eGFR has been calculated using the CKD EPI equation. This calculation has not been validated in all clinical situations. eGFR's persistently <60 mL/min signify possible Chronic Kidney Disease.    Anion gap 6 5 - 15  CBC     Status: Abnormal   Collection Time: 11/27/15  6:39 AM  Result Value Ref Range   WBC 6.3 4.0 - 10.5 K/uL   RBC 3.12 (L) 3.87 - 5.11 MIL/uL   Hemoglobin 8.5 (L) 12.0 - 15.0 g/dL   HCT 25.3 (L) 36.0 - 46.0 %   MCV 81.1 78.0 - 100.0 fL   MCH 27.2 26.0 - 34.0 pg   MCHC 33.6 30.0 - 36.0 g/dL   RDW 17.3 (H) 11.5 - 15.5 %   Platelets 154 150 - 400 K/uL  Sodium, urine, random     Status: None  Collection Time: 11/27/15  2:35 PM  Result Value Ref Range   Sodium, Ur 31 mmol/L  Comprehensive metabolic panel     Status: Abnormal   Collection Time: 11/28/15  4:38 AM  Result Value Ref Range   Sodium 142 135 - 145 mmol/L    Potassium 3.2 (L) 3.5 - 5.1 mmol/L   Chloride 111 101 - 111 mmol/L   CO2 24 22 - 32 mmol/L   Glucose, Bld 92 65 - 99 mg/dL   BUN 35 (H) 6 - 20 mg/dL   Creatinine, Ser 1.48 (H) 0.44 - 1.00 mg/dL   Calcium 7.9 (L) 8.9 - 10.3 mg/dL   Total Protein 4.2 (L) 6.5 - 8.1 g/dL   Albumin 2.0 (L) 3.5 - 5.0 g/dL   AST 44 (H) 15 - 41 U/L   ALT 14 14 - 54 U/L   Alkaline Phosphatase 118 38 - 126 U/L   Total Bilirubin 1.5 (H) 0.3 - 1.2 mg/dL   GFR calc non Af Amer 37 (L) >60 mL/min   GFR calc Af Amer 43 (L) >60 mL/min    Comment: (NOTE) The eGFR has been calculated using the CKD EPI equation. This calculation has not been validated in all clinical situations. eGFR's persistently <60 mL/min signify possible Chronic Kidney Disease.    Anion gap 7 5 - 15    Dg Chest 2 View  11/26/2015  CLINICAL DATA:  Both summed edema for 1 week.  Shortness of breath. EXAM: CHEST  2 VIEW COMPARISON:  10/19/2015 FINDINGS: There is no focal parenchymal opacity. There is a trace right pleural effusion. There is no pneumothorax. The heart and mediastinal contours are unremarkable. The osseous structures are unremarkable. IMPRESSION: Trace right pleural effusion. Electronically Signed   By: Kathreen Devoid   On: 11/26/2015 10:28    Review of Systems  Constitutional: Negative for fever and chills.  Respiratory: Negative for cough and shortness of breath.   Cardiovascular: Positive for leg swelling. Negative for orthopnea and PND.  Gastrointestinal: Negative for nausea and vomiting.  Neurological: Positive for weakness.   Blood pressure 123/75, pulse 59, temperature 97.6 F (36.4 C), temperature source Oral, resp. rate 18, height '5\' 3"'$  (1.6 m), weight 177 lb 7.5 oz (80.5 kg), SpO2 99 %. Physical Exam  Constitutional: She is oriented to person, place, and time. No distress.  Eyes: No scleral icterus.  Neck: No JVD present.  Cardiovascular: Normal rate and regular rhythm.   Respiratory: She has no wheezes. She has no  rales.  GI: She exhibits no distension. There is no rebound.  Musculoskeletal: She exhibits edema.  Neurological: She is alert and oriented to person, place, and time.    Assessment/Plan: Problem #1 acute kidney injury: Possibly ATN/prerenal syndrome. Presently her renal function seems to be improving. Problem #2 anasarca: Patient with significant edema. Presently she is asymptomatic. Problem #3 liver cirrhosis Problem #4 hypokalemia Problem #5 anemia: Her hemoglobin is low. Problem #6 hypertension: Her blood pressure is reasonably controlled Problem #7 history of sleep apnea Plan: We'll check urine sodium, creatinine. 2] will change albumin to 25 mg IV twice a day for 2 days 3] will change Lasix to 80 mg IV twice a day 4] will start patient on Aldactone 50 mg once a day 5] will start patient on potassium 40 mEq by mouth twice a day 6] will check her basic metabolic panel in the morning.  Yahira Timberman S 11/28/2015, 8:49 AM

## 2015-11-29 ENCOUNTER — Other Ambulatory Visit: Payer: Self-pay

## 2015-11-29 ENCOUNTER — Inpatient Hospital Stay (HOSPITAL_COMMUNITY): Payer: Commercial Managed Care - HMO

## 2015-11-29 DIAGNOSIS — K7031 Alcoholic cirrhosis of liver with ascites: Secondary | ICD-10-CM

## 2015-11-29 DIAGNOSIS — K7460 Unspecified cirrhosis of liver: Secondary | ICD-10-CM

## 2015-11-29 DIAGNOSIS — R609 Edema, unspecified: Secondary | ICD-10-CM

## 2015-11-29 LAB — COMPREHENSIVE METABOLIC PANEL
ALBUMIN: 2.4 g/dL — AB (ref 3.5–5.0)
ALK PHOS: 147 U/L — AB (ref 38–126)
ALT: 13 U/L — ABNORMAL LOW (ref 14–54)
ANION GAP: 7 (ref 5–15)
AST: 43 U/L — ABNORMAL HIGH (ref 15–41)
BUN: 42 mg/dL — ABNORMAL HIGH (ref 6–20)
CALCIUM: 7.9 mg/dL — AB (ref 8.9–10.3)
CHLORIDE: 111 mmol/L (ref 101–111)
CO2: 23 mmol/L (ref 22–32)
Creatinine, Ser: 1.75 mg/dL — ABNORMAL HIGH (ref 0.44–1.00)
GFR calc Af Amer: 35 mL/min — ABNORMAL LOW (ref >60)
GFR calc non Af Amer: 30 mL/min — ABNORMAL LOW (ref >60)
GLUCOSE: 98 mg/dL (ref 65–99)
Potassium: 4.2 mmol/L (ref 3.5–5.1)
SODIUM: 141 mmol/L (ref 135–145)
Total Bilirubin: 1 mg/dL (ref 0.3–1.2)
Total Protein: 4.2 g/dL — ABNORMAL LOW (ref 6.5–8.1)

## 2015-11-29 LAB — HEMOGLOBIN AND HEMATOCRIT, BLOOD
HEMATOCRIT: 23.1 % — AB (ref 36.0–46.0)
HEMOGLOBIN: 7.6 g/dL — AB (ref 12.0–15.0)

## 2015-11-29 MED ORDER — TORSEMIDE 20 MG PO TABS
60.0000 mg | ORAL_TABLET | Freq: Every day | ORAL | Status: DC
  Administered 2015-11-29 – 2015-12-01 (×3): 60 mg via ORAL
  Filled 2015-11-29 (×3): qty 3

## 2015-11-29 NOTE — Progress Notes (Signed)
  Subjective:  Patient has no complaints. She denies shortness of breath or abdominal pain. She says lower extremity edema is not going away. She had 4 bowel movements yesterday but none today. She is worried that decrease lactulose dose may result in confusion.   Objective: Blood pressure 105/69, pulse 74, temperature 98.6 F (37 C), temperature source Oral, resp. rate 20, height 5\' 3"  (1.6 m), weight 178 lb 8 oz (80.967 kg), SpO2 99 %. Patient is alert and in no acute distress. Asterixis absent. She has left periorbital ecchymosis. Conjunctiva is pale. Sclerae nonicteric. No neck masses or thyromegaly noted. JVD does not appear to be elevated. Cardiac exam with regular rhythm normal S1 and S2. Grade 2/6 systolic ejection murmur noted at left sternal border. Lungs are clear to auscultation. Abdomen is full but soft and nontender with firm liver. She has 3+ pitting edema involving both legs. She also has some edema involving both eyes.  Urine output last 24 hours 775 mL. Random urinary sodium 26 mmol/L  Labs/studies Results:   Recent Labs  11/27/15 0639  WBC 6.3  HGB 8.5*  HCT 25.3*  PLT 154    BMET   Recent Labs  11/27/15 0639 11/28/15 0438 11/29/15 0646  NA 142 142 141  K 3.2* 3.2* 4.2  CL 115* 111 111  CO2 21* 24 23  GLUCOSE 94 92 98  BUN 33* 35* 42*  CREATININE 1.59* 1.48* 1.75*  CALCIUM 7.6* 7.9* 7.9*    LFT   Recent Labs  11/27/15 0639 11/28/15 0438 11/29/15 0646  PROT 3.9* 4.2* 4.2*  ALBUMIN 1.5* 2.0* 2.4*  AST 48* 44* 43*  ALT 17 14 13*  ALKPHOS 153* 118 147*  BILITOT 1.1 1.5* 1.0     Assessment:  #1. Fluid overload. She is not responding to therapy so far. Dr. Hinda Lenis has switch her to torsemide 60 mg by mouth daily. Most of her fluid overload appears to be in her lower extremities. Clinically she does not have ascites. She did have echocardiography in September last year revealing TR and mildly elevated right-sided pressures. It remains to  be seen if it is another reason for her fluid overload. Hypoalbuminemia is also contribution to fluid retention. Serum albumin is up since she has been receiving albumin transfusion. Patient is back on spironolactone. Will have to watch serum potassium closely as she became hyperkalemic on spironolactone. #2. Renal failure. Serum creatinine has increased in the last 24 hours. Suspect prerenal syndrome. #3. Anemia. Anemia appears to be due to chronic disease. No evidence of overt GI bleed. She had colonoscopy in June 2014 with removal of 4 polyps and these are tubular adenomas. She had EGD in June 2016 revealing portal gastropathy and 3 columns of grade 1 esophageal varices. Will check for iron B12 and folate deficiency. #4. Advanced alcoholic cirrhosis. Patient has an appointment in [facility] and 12/07/2015. We will recalculate meld score tomorrow. #5. Hepatic encephalopathy. Clinically she does not appear to be encephalopathic.  Recommendations:  Abdominal ultrasound with Doppler study to check for TIPS patency as well as review IVC portal vein and hepatic veins. Iron TIBC ferritin B12 and folate levels. Bilirubin and INR with metabolic 7 in a.m.

## 2015-11-29 NOTE — Progress Notes (Signed)
Subjective: Patient feels better. She on combinations of diuretics according t nephrology recommendation. Her anasarca has not improved much. Objective: Vital signs in last 24 hours: Temp:  [98.3 F (36.8 C)-98.6 F (37 C)] 98.6 F (37 C) (01/30 7654) Pulse Rate:  [62-114] 74 (01/30 0608) Resp:  [18-20] 20 (01/30 0608) BP: (105-128)/(67-69) 105/69 mmHg (01/30 0608) SpO2:  [100 %] 100 % (01/30 6503) Weight:  [80.287 kg (177 lb)-80.967 kg (178 lb 8 oz)] 80.967 kg (178 lb 8 oz) (01/30 5465) Weight change: -0.213 kg (-7.5 oz) Last BM Date: 11/28/15  Intake/Output from previous day: 01/29 0701 - 01/30 0700 In: 845 [P.O.:720; IV Piggyback:125] Out: 775 [Urine:775]  PHYSICAL EXAM General appearance: alert and no distress Resp: diminished breath sounds bilaterally and rhonchi bilaterally Cardio: S1, S2 normal GI: abdomen is full, bowel sound is ++, no area of tenderness Extremities: anasarca  Lab Results:  Results for orders placed or performed during the hospital encounter of 11/26/15 (from the past 48 hour(s))  Sodium, urine, random     Status: None   Collection Time: 11/27/15  2:35 PM  Result Value Ref Range   Sodium, Ur 31 mmol/L  Comprehensive metabolic panel     Status: Abnormal   Collection Time: 11/28/15  4:38 AM  Result Value Ref Range   Sodium 142 135 - 145 mmol/L   Potassium 3.2 (L) 3.5 - 5.1 mmol/L   Chloride 111 101 - 111 mmol/L   CO2 24 22 - 32 mmol/L   Glucose, Bld 92 65 - 99 mg/dL   BUN 35 (H) 6 - 20 mg/dL   Creatinine, Ser 1.48 (H) 0.44 - 1.00 mg/dL   Calcium 7.9 (L) 8.9 - 10.3 mg/dL   Total Protein 4.2 (L) 6.5 - 8.1 g/dL   Albumin 2.0 (L) 3.5 - 5.0 g/dL   AST 44 (H) 15 - 41 U/L   ALT 14 14 - 54 U/L   Alkaline Phosphatase 118 38 - 126 U/L   Total Bilirubin 1.5 (H) 0.3 - 1.2 mg/dL   GFR calc non Af Amer 37 (L) >60 mL/min   GFR calc Af Amer 43 (L) >60 mL/min    Comment: (NOTE) The eGFR has been calculated using the CKD EPI equation. This calculation has  not been validated in all clinical situations. eGFR's persistently <60 mL/min signify possible Chronic Kidney Disease.    Anion gap 7 5 - 15  Creatinine, urine, random     Status: None   Collection Time: 11/28/15  2:08 PM  Result Value Ref Range   Creatinine, Urine 129.48 mg/dL  Sodium, urine, random     Status: None   Collection Time: 11/28/15  2:08 PM  Result Value Ref Range   Sodium, Ur 26 mmol/L  Comprehensive metabolic panel     Status: Abnormal   Collection Time: 11/29/15  6:46 AM  Result Value Ref Range   Sodium 141 135 - 145 mmol/L   Potassium 4.2 3.5 - 5.1 mmol/L    Comment: DELTA CHECK NOTED   Chloride 111 101 - 111 mmol/L   CO2 23 22 - 32 mmol/L   Glucose, Bld 98 65 - 99 mg/dL   BUN 42 (H) 6 - 20 mg/dL   Creatinine, Ser 1.75 (H) 0.44 - 1.00 mg/dL   Calcium 7.9 (L) 8.9 - 10.3 mg/dL   Total Protein 4.2 (L) 6.5 - 8.1 g/dL   Albumin 2.4 (L) 3.5 - 5.0 g/dL   AST 43 (H) 15 - 41 U/L  ALT 13 (L) 14 - 54 U/L   Alkaline Phosphatase 147 (H) 38 - 126 U/L   Total Bilirubin 1.0 0.3 - 1.2 mg/dL   GFR calc non Af Amer 30 (L) >60 mL/min   GFR calc Af Amer 35 (L) >60 mL/min    Comment: (NOTE) The eGFR has been calculated using the CKD EPI equation. This calculation has not been validated in all clinical situations. eGFR's persistently <60 mL/min signify possible Chronic Kidney Disease.    Anion gap 7 5 - 15    ABGS No results for input(s): PHART, PO2ART, TCO2, HCO3 in the last 72 hours.  Invalid input(s): PCO2 CULTURES No results found for this or any previous visit (from the past 240 hour(s)). Studies/Results: No results found.  Medications: I have reviewed the patient's current medications.  Assesment:   Principal Problem:   Anasarca Active Problems:   Hypertension   Cirrhosis, alcoholic (HCC)   ETOH abuse   Stage III chronic kidney disease   Peripheral edema   Anemia of chronic disease   Alcoholic cirrhosis of liver without ascites ([place])    Plan:   Medications reviewed Continue combinations of diuretics  Will monitor CMP    LOS: 3 days   [place]attle Dalporto 11/29/2015, 8:15 AM

## 2015-11-29 NOTE — Patient Outreach (Signed)
I called Mrs. Gibbins to try to discuss her medications.  This was my third attempt.  Her husband answered and stated she was in the hospital.  I will alert Jacqlyn Larsen, RN, her nurse care manager.  I will send a letter to see if she is still interested in my help.  If I do not hear back in 10 days, I will close her to pharmacy.   Deanne Coffer, PharmD, [place] [phone]

## 2015-11-29 NOTE — Progress Notes (Signed)
Subjective: Patient states that she's feeling much better. She denies any difficulty breathing. Still with leg swelling. Her appetite is good and no nausea or vomiting.   Objective: Vital signs in last 24 hours: Temp:  [98.3 F (36.8 C)-98.6 F (37 C)] 98.6 F (37 C) (01/30 [id number]) Pulse Rate:  [62-114] 74 (01/30 0608) Resp:  [18-20] 20 (01/30 0608) BP: (105-128)/(67-69) 105/69 mmHg (01/30 0608) SpO2:  [100 %] 100 % (01/30 [id number]) Weight:  [177 lb (80.287 kg)-178 lb 8 oz (80.967 kg)] 178 lb 8 oz (80.967 kg) (01/30 [id number])  Intake/Output from previous day: 01/29 0701 - 01/30 0700 In: 845 [P.O.:720; IV Piggyback:125] Out: 775 [Urine:775] Intake/Output this shift:     Recent Labs  11/26/15 1016 11/27/15 0639  HGB 8.6* 8.5*    Recent Labs  11/26/15 1016 11/27/15 0639  WBC 6.7 6.3  RBC 3.13* 3.12*  HCT 25.2* 25.3*  PLT 162 154    Recent Labs  11/28/15 0438 11/29/15 0646  NA 142 141  K 3.2* 4.2  CL 111 111  CO2 24 23  BUN 35* 42*  CREATININE 1.48* 1.75*  GLUCOSE 92 98  CALCIUM 7.9* 7.9*    Recent Labs  11/26/15 1016  INR 1.45    Generally patient is alert and in no apparent distress Chest is clear to auscultation, no rales rhonchi or egophony Heart exam revealed regular rate and rhythm no murmur Extremities she has 3+ edema.  Assessment/Plan: Problem #1 renal failure: Acute. Presently ATN versus prerenal syndrome. Patient is non-oliguric. Her BUN and creatinine however has slightly increased. Problem #2 hypokalemia: Her potassium has corrected. Presently she is on potassium supplement. Problem #3 anasarca: Presently patient on Lasix, Aldactone and albumin. Patient is non-oliguric however there is no significant change in her cycle. Problem #4 history of liver cirrhosis Problem #5 hypertension her blood pressure is reasonably controlled. Problem #6 anemia: Her hemoglobin is low. Plan: 1] We'll DC Lasix and albumin 2] will continue his Aldactone 3] will  start on Demadex 60 mg by mouth once a day 4] patient advised to cut down her salt and fluid intake. 5] We'll check her basic metabolic panel in the morning.   Notnamed Scholz S 11/29/2015, 8:19 AM

## 2015-11-30 LAB — IRON AND TIBC
IRON: 62 ug/dL (ref 28–170)
Saturation Ratios: 59 % — ABNORMAL HIGH (ref 10.4–31.8)
TIBC: 105 ug/dL — ABNORMAL LOW (ref 250–450)
UIBC: 43 ug/dL

## 2015-11-30 LAB — FERRITIN: FERRITIN: 239 ng/mL (ref 11–307)

## 2015-11-30 LAB — COMPREHENSIVE METABOLIC PANEL
ALK PHOS: 136 U/L — AB (ref 38–126)
ALT: 12 U/L — AB (ref 14–54)
AST: 41 U/L (ref 15–41)
Albumin: 2.6 g/dL — ABNORMAL LOW (ref 3.5–5.0)
Anion gap: 9 (ref 5–15)
BUN: 42 mg/dL — AB (ref 6–20)
CALCIUM: 8.2 mg/dL — AB (ref 8.9–10.3)
CHLORIDE: 110 mmol/L (ref 101–111)
CO2: 24 mmol/L (ref 22–32)
CREATININE: 1.67 mg/dL — AB (ref 0.44–1.00)
GFR calc Af Amer: 37 mL/min — ABNORMAL LOW (ref >60)
GFR, EST NON AFRICAN AMERICAN: 32 mL/min — AB (ref >60)
Glucose, Bld: 93 mg/dL (ref 65–99)
Potassium: 4.2 mmol/L (ref 3.5–5.1)
Sodium: 143 mmol/L (ref 135–145)
Total Bilirubin: 1 mg/dL (ref 0.3–1.2)
Total Protein: 4.3 g/dL — ABNORMAL LOW (ref 6.5–8.1)

## 2015-11-30 LAB — PROTIME-INR
INR: 1.7 — ABNORMAL HIGH (ref 0.00–1.49)
PROTHROMBIN TIME: 20 s — AB (ref 11.6–15.2)

## 2015-11-30 LAB — FOLATE

## 2015-11-30 LAB — HEMOGLOBIN AND HEMATOCRIT, BLOOD
HCT: 21.9 % — ABNORMAL LOW (ref 36.0–46.0)
Hemoglobin: 7.4 g/dL — ABNORMAL LOW (ref 12.0–15.0)

## 2015-11-30 LAB — PREPARE RBC (CROSSMATCH)

## 2015-11-30 LAB — VITAMIN B12: VITAMIN B 12: 785 pg/mL (ref 180–914)

## 2015-11-30 MED ORDER — SODIUM CHLORIDE 0.9 % IV SOLN
Freq: Once | INTRAVENOUS | Status: AC
  Administered 2015-11-30: 15:00:00 via INTRAVENOUS

## 2015-11-30 NOTE — Consult Note (Signed)
   [facility] CM Inpatient Consult   11/30/2015  VAEDA MAYE [birth year] [id number]  Spoke with patient at bedside. Patient is currently active with [facility] for chronic disease management services, of which she would like to continue at this time with transition of care phone calls by our [facility] RN [facility].  Our community based plan of care focus is on disease management and community resource support.  Patient will receive a post discharge transition of care calls and will be evaluated for monthly home visits for assessments and disease process education.  Made Inpatient Case Manager aware that [place] [facility] following.  Of note, [facility] Care Management services does not replace or interfere with any services that are arranged by inpatient case management or social work.   For additional questions or referrals please contact:  Royetta Crochet. Laymond Purser, RN, BSN, [facility] Liaison [phone]

## 2015-11-30 NOTE — Progress Notes (Signed)
Subjective: Patient offers no complaints. She states that her leg swelling is improving. Presently she denies any difficulty in breathing.  Objective: Vital signs in last 24 hours: Temp:  [98.4 F (36.9 C)-98.8 F (37.1 C)] 98.8 F (37.1 C) (01/31 0604) Pulse Rate:  [57-78] 78 (01/31 0604) Resp:  [18] 18 (01/31 0604) BP: (109-142)/(57-76) 109/57 mmHg (01/31 0604) SpO2:  [99 %-100 %] 99 % (01/31 0604) Weight:  [176 lb 12.9 oz (80.2 kg)] 176 lb 12.9 oz (80.2 kg) (01/31 0500)  Intake/Output from previous day: 01/30 0701 - 01/31 0700 In: 680 [P.O.:480; IV Piggyback:200] Out: 1150 [Urine:1150] Intake/Output this shift:     Recent Labs  11/30/15 0500 11/30/15 0520  HGB 7.6* 7.4*    Recent Labs  11/30/15 0500 11/30/15 0520  HCT 23.1* 21.9*    Recent Labs  11/29/15 0646 11/30/15 0520  NA 141 143  K 4.2 4.2  CL 111 110  CO2 23 24  BUN 42* 42*  CREATININE 1.75* 1.67*  GLUCOSE 98 93  CALCIUM 7.9* 8.2*    Recent Labs  11/30/15 0520  INR 1.70*    Generally patient is alert and in no apparent distress Chest : She has bibasilar inspiratory crackles at the back. Heart exam revealed regular rate and rhythm no murmur Extremities she has 3+ edema.  Assessment/Plan: Problem #1 renal failure: Acute. Presently ATN versus prerenal syndrome. Patient is non-oliguric. Her renal function continued to improve. Problem #2 hypokalemia: Her potassium has corrected.  Problem #3 anasarca: Patient presently on Demadex and Aldactone. She is nonoliguric and  Pr her anasarca seems to be improving Problem #4 history of liver cirrhosis Problem #5 hypertension her blood pressure is reasonably controlled. Problem #6 anemia: Her hemoglobin is low.Her iron studies is pending Plan: 1] We'll continue with Aldactone and Demadex. 2] if patient is going to be discharged will follow her as an outpatient to 2 weeks.  3] We'll check her basic metabolic panel in the  morning.   Ashika Apuzzo S 11/30/2015, 8:05 AM

## 2015-11-30 NOTE — Progress Notes (Signed)
  Subjective:  Patient feels better. She states she has passed more urine in the last 24 hours then in the preceding days. She denies melena or rectal bleeding.   Objective: Blood pressure 109/57, pulse 78, temperature 98.8 F (37.1 C), temperature source Oral, resp. rate 18, height 5\' 3"  (1.6 m), weight 176 lb 12.9 oz (80.2 kg), SpO2 98 %. Patient is alert and in no acute distress. Asterixis absent. Left periorbital ecchymosis is less prominent today. Lower extremity edema is unchanged.  Urine output last 24 hours 1150 mL.   Labs/studies Results:   Recent Labs  11/30/15 0500 11/30/15 0520  HGB 7.6* 7.4*  HCT 23.1* 21.9*    BMET   Recent Labs  11/28/15 0438 11/29/15 0646 11/30/15 0520  NA 142 141 143  K 3.2* 4.2 4.2  CL 111 111 110  CO2 24 23 24   GLUCOSE 92 98 93  BUN 35* 42* 42*  CREATININE 1.48* 1.75* 1.67*  CALCIUM 7.9* 7.9* 8.2*    LFT   Recent Labs  11/28/15 0438 11/29/15 0646 11/30/15 0520  PROT 4.2* 4.2* 4.3*  ALBUMIN 2.0* 2.4* 2.6*  AST 44* 43* 41  ALT 14 13* 12*  ALKPHOS 118 147* 136*  BILITOT 1.5* 1.0 1.0    INR 1.70  B12 , iron level and iron studies pending.  Ultrasound negative for ascites small to moderate right pleural effusion. Cirrhotic liver. No liver masses. CBD 8.5 mm balloon without any stones. TIPS patent.   Assessment:  #1. Fluid overload. She appears to be responding to torsemide. #2. Renal failure. Serum creatinine is coming down. Urine output has improved in the last 24 hours. #3. Anemia. Hemoglobin is down to 7.4 g. No evidence of overt GI bleed. She had colonoscopy in June 2014 with removal of 4 polyps and these are tubular adenomas. She had EGD in June 2016 revealing portal gastropathy and 3 columns of grade 1 esophageal varices. Iron studies [id number] and folic levels are pending. We'll do Hemoccults. #4. Advanced alcoholic cirrhosis. Patient has an appointment in [facility] and 12/07/2015. MELD score is 17. #5.  Hepatic encephalopathy. Patient is on lactulose and Xifaxan and is doing well.  Recommendations:  Hemoccults x 3. Transfusion as discussed with Dr. Legrand Rams. AFP.

## 2015-11-30 NOTE — Progress Notes (Signed)
Subjective: Patient feels better. Patient diuresing is more.l Objective: Vital signs in last 24 hours: Temp:  [98.4 F (36.9 C)-98.8 F (37.1 C)] 98.8 F (37.1 C) (01/31 0604) Pulse Rate:  [57-78] 78 (01/31 0604) Resp:  [18] 18 (01/31 0604) BP: (109-142)/(57-76) 109/57 mmHg (01/31 0604) SpO2:  [99 %-100 %] 99 % (01/31 0604) Weight:  [80.2 kg (176 lb 12.9 oz)] 80.2 kg (176 lb 12.9 oz) (01/31 0500) Weight change: -0.087 kg (-3.1 oz) Last BM Date: 11/29/15  Intake/Output from previous day: 01/30 0701 - 01/31 0700 In: 680 [P.O.:480; IV Piggyback:200] Out: 1150 [Urine:1150]  PHYSICAL EXAM General appearance: alert and no distress Resp: diminished breath sounds bilaterally and rhonchi bilaterally Cardio: S1, S2 normal GI: abdomen is full, bowel sound is ++, no area of tenderness Extremities: anasarca  Lab Results:  Results for orders placed or performed during the hospital encounter of 11/26/15 (from the past 48 hour(s))  Creatinine, urine, random     Status: None   Collection Time: 11/28/15  2:08 PM  Result Value Ref Range   Creatinine, Urine 129.48 mg/dL  Sodium, urine, random     Status: None   Collection Time: 11/28/15  2:08 PM  Result Value Ref Range   Sodium, Ur 26 mmol/L  Comprehensive metabolic panel     Status: Abnormal   Collection Time: 11/29/15  6:46 AM  Result Value Ref Range   Sodium 141 135 - 145 mmol/L   Potassium 4.2 3.5 - 5.1 mmol/L    Comment: DELTA CHECK NOTED   Chloride 111 101 - 111 mmol/L   CO2 23 22 - 32 mmol/L   Glucose, Bld 98 65 - 99 mg/dL   BUN 42 (H) 6 - 20 mg/dL   Creatinine, Ser 1.75 (H) 0.44 - 1.00 mg/dL   Calcium 7.9 (L) 8.9 - 10.3 mg/dL   Total Protein 4.2 (L) 6.5 - 8.1 g/dL   Albumin 2.4 (L) 3.5 - 5.0 g/dL   AST 43 (H) 15 - 41 U/L   ALT 13 (L) 14 - 54 U/L   Alkaline Phosphatase 147 (H) 38 - 126 U/L   Total Bilirubin 1.0 0.3 - 1.2 mg/dL   GFR calc non Af Amer 30 (L) >60 mL/min   GFR calc Af Amer 35 (L) >60 mL/min    Comment:  (NOTE) The eGFR has been calculated using the CKD EPI equation. This calculation has not been validated in all clinical situations. eGFR's persistently <60 mL/min signify possible Chronic Kidney Disease.    Anion gap 7 5 - 15  Hemoglobin and hematocrit, blood     Status: Abnormal   Collection Time: 11/30/15  5:00 AM  Result Value Ref Range   Hemoglobin 7.6 (L) 12.0 - 15.0 g/dL   HCT 23.1 (L) 36.0 - 46.0 %  Hemoglobin and hematocrit, blood     Status: Abnormal   Collection Time: 11/30/15  5:20 AM  Result Value Ref Range   Hemoglobin 7.4 (L) 12.0 - 15.0 g/dL   HCT 21.9 (L) 36.0 - 46.0 %  Comprehensive metabolic panel     Status: Abnormal   Collection Time: 11/30/15  5:20 AM  Result Value Ref Range   Sodium 143 135 - 145 mmol/L   Potassium 4.2 3.5 - 5.1 mmol/L   Chloride 110 101 - 111 mmol/L   CO2 24 22 - 32 mmol/L   Glucose, Bld 93 65 - 99 mg/dL   BUN 42 (H) 6 - 20 mg/dL   Creatinine, Ser 1.67 (H) 0.44 -  1.00 mg/dL   Calcium 8.2 (L) 8.9 - 10.3 mg/dL   Total Protein 4.3 (L) 6.5 - 8.1 g/dL   Albumin 2.6 (L) 3.5 - 5.0 g/dL   AST 41 15 - 41 U/L   ALT 12 (L) 14 - 54 U/L   Alkaline Phosphatase 136 (H) 38 - 126 U/L   Total Bilirubin 1.0 0.3 - 1.2 mg/dL   GFR calc non Af Amer 32 (L) >60 mL/min   GFR calc Af Amer 37 (L) >60 mL/min    Comment: (NOTE) The eGFR has been calculated using the CKD EPI equation. This calculation has not been validated in all clinical situations. eGFR's persistently <60 mL/min signify possible Chronic Kidney Disease.    Anion gap 9 5 - 15  Protime-INR     Status: Abnormal   Collection Time: 11/30/15  5:20 AM  Result Value Ref Range   Prothrombin Time 20.0 (H) 11.6 - 15.2 seconds   INR 1.70 (H) 0.00 - 1.49    ABGS No results for input(s): PHART, PO2ART, TCO2, HCO3 in the last 72 hours.  Invalid input(s): PCO2 CULTURES No results found for this or any previous visit (from the past 240 hour(s)). Studies/Results: US Abdomen Complete  11/29/2015   CLINICAL DATA:  60 year old female with alcoholic cirrhosis and history of TIPS creation 6 months previously. EXAM: ABDOMEN ULTRASOUND COMPLETE; LIVER DUPLEX TIPS STUDY COMPARISON:  Prior duplex ultrasound 08/03/2015 FINDINGS: Gallbladder: Surgically absent Common bile duct: Diameter: 0.85 mm Liver: Diffusely heterogeneous hepatic parenchyma with coarsening of the echotexture and faint nodularity of the liver surface. No discrete mass or lesion. IVC: No abnormality visualized. Pancreas: Visualized portion unremarkable. Spleen: Size and appearance within normal limits. Right Kidney: Length: 11.9 cm. Echogenicity within normal limits. No mass or hydronephrosis visualized. Left Kidney: Length: 11.4 cm. Echogenicity within normal limits. No mass or hydronephrosis visualized. Abdominal aorta: No aneurysm visualized. Other findings: None. LIVER DUPLEX VASCULAR TIPS STUDY TIPS Shunt Velocities Proximal: 90 cm/sec Mid:  163 Distal:  71 cm/sec Portal Vein Velocities Main:  62 Cm/sec with normal hepatopetal directional flow. Right:  70 cm/sec with normal hepatopetal directional flow. Left:  42 cm/sec with normal hepatopetal directional flow. Hepatic Vein Velocities Right:  Not well seen Middle:  17 cm/sec Left:  30 cm/sec Hepatic Artery Velocity:  113 centimeters/second Splenic Vein Velocity:  24 centimeters/second Varices:  None visible Ascites: None Other findings: Small right pleural effusion. IMPRESSION: 1. Widely patent right hepatic to right portal venous TIPS without evidence of complication. 2. Portal venous flow maintains normal hepatopetal direction. 3. No evidence of ascites. 4. There is a small to moderate right pleural effusion. 5. Morphologically cirrhotic liver. 6. No discrete liver lesion or mass. 7. Surgical changes of prior cholecystectomy. 8. Borderline common bile duct dilatation at 8.5 mm may be secondary to prior cholecystectomy status. Signed, Criselda Peaches, MD [facility] Radiology Electronically Signed   By: Jacqulynn Cadet M.D.   On: 11/29/2015 16:36   [place] Art/ven Flow Abd Pelv Doppler Limited  11/29/2015  CLINICAL DATA:  60 year old female with alcoholic cirrhosis and history of TIPS creation 6 months previously. EXAM: ABDOMEN ULTRASOUND COMPLETE; LIVER DUPLEX TIPS STUDY COMPARISON:  Prior duplex ultrasound 08/03/2015 FINDINGS: Gallbladder: Surgically absent Common bile duct: Diameter: 0.85 mm Liver: Diffusely heterogeneous hepatic parenchyma with coarsening of the echotexture and faint nodularity of the liver surface. No discrete mass or lesion. IVC: No abnormality visualized. Pancreas: Visualized portion unremarkable. Spleen: Size and appearance within normal  limits. Right Kidney: Length: 11.9 cm. Echogenicity within normal limits. No mass or hydronephrosis visualized. Left Kidney: Length: 11.4 cm. Echogenicity within normal limits. No mass or hydronephrosis visualized. Abdominal aorta: No aneurysm visualized. Other findings: None. LIVER DUPLEX VASCULAR TIPS STUDY TIPS Shunt Velocities Proximal: 90 cm/sec Mid:  163 Distal:  71 cm/sec Portal Vein Velocities Main:  62 Cm/sec with normal hepatopetal directional flow. Right:  70 cm/sec with normal hepatopetal directional flow. Left:  42 cm/sec with normal hepatopetal directional flow. Hepatic Vein Velocities Right:  Not well seen Middle:  17 cm/sec Left:  30 cm/sec Hepatic Artery Velocity:  113 centimeters/second Splenic Vein Velocity:  24 centimeters/second Varices:  None visible Ascites: None Other findings: Small right pleural effusion. IMPRESSION: 1. Widely patent right hepatic to right portal venous TIPS without evidence of complication. 2. Portal venous flow maintains normal hepatopetal direction. 3. No evidence of ascites. 4. There is a small to moderate right pleural effusion. 5. Morphologically cirrhotic liver. 6. No discrete liver lesion or mass. 7. Surgical changes of prior  cholecystectomy. 8. Borderline common bile duct dilatation at 8.5 mm may be secondary to prior cholecystectomy status. Signed, Criselda Peaches, MD [facility] Radiology Electronically Signed   By: Jacqulynn Cadet M.D.   On: 11/29/2015 16:36    Medications: I have reviewed the patient's current medications.  Assesment:   Principal Problem:   Anasarca Active Problems:   Hypertension   Cirrhosis, alcoholic (HCC)   ETOH abuse   Stage III chronic kidney disease   Peripheral edema   Anemia of chronic disease   Alcoholic cirrhosis of liver 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) Anemia   Plan:  Medications reviewed Continue combinations of diuretics  Type cross match and transfuse 2 units of PRBC Will monitor CMP    LOS: 4 days   Levy Wellman 11/30/2015, 8:26 AM

## 2015-12-01 LAB — TYPE AND SCREEN
ABO/RH(D): O POS
ANTIBODY SCREEN: NEGATIVE
Unit division: 0
Unit division: 0

## 2015-12-01 LAB — BASIC METABOLIC PANEL
ANION GAP: 7 (ref 5–15)
BUN: 41 mg/dL — ABNORMAL HIGH (ref 6–20)
CALCIUM: 8 mg/dL — AB (ref 8.9–10.3)
CHLORIDE: 110 mmol/L (ref 101–111)
CO2: 25 mmol/L (ref 22–32)
Creatinine, Ser: 1.66 mg/dL — ABNORMAL HIGH (ref 0.44–1.00)
GFR calc non Af Amer: 33 mL/min — ABNORMAL LOW (ref >60)
GFR, EST AFRICAN AMERICAN: 38 mL/min — AB (ref >60)
Glucose, Bld: 91 mg/dL (ref 65–99)
Potassium: 4.4 mmol/L (ref 3.5–5.1)
Sodium: 142 mmol/L (ref 135–145)

## 2015-12-01 LAB — AFP TUMOR MARKER: AFP-Tumor Marker: 3 ng/mL (ref 0.0–8.3)

## 2015-12-01 LAB — OCCULT BLOOD X 1 CARD TO LAB, STOOL: FECAL OCCULT BLD: NEGATIVE

## 2015-12-01 LAB — HEMOGLOBIN AND HEMATOCRIT, BLOOD
HEMATOCRIT: 27 % — AB (ref 36.0–46.0)
Hemoglobin: 9.1 g/dL — ABNORMAL LOW (ref 12.0–15.0)

## 2015-12-01 MED ORDER — SPIRONOLACTONE 50 MG PO TABS: 50.0000 mg | ORAL_TABLET | Freq: Every day | ORAL | Status: DC

## 2015-12-01 MED ORDER — TORSEMIDE 20 MG PO TABS: 60.0000 mg | ORAL_TABLET | Freq: Every day | ORAL | Status: DC

## 2015-12-01 NOTE — Progress Notes (Signed)
Savannah Jackson  MRN: [id number]  DOB/AGE: Aug 04, 1956 60 y.o.  Primary Care Physician:FANTA,TESFAYE, MD  Admit date: 11/26/2015  Chief Complaint:  Chief Complaint  Patient presents with  . Shortness of Breath  . Leg Swelling    S-Pt presented on  11/26/2015 with  Chief Complaint  Patient presents with  . Shortness of Breath  . Leg Swelling  .    Pt today feels better  Meds . albumin human  25 g Intravenous BID  . folic acid  1 mg Oral Daily  . lactulose  10 g Oral TID  . nadolol  10 mg Oral Daily  . rifaximin  550 mg Oral BID  . sodium chloride flush  3 mL Intravenous Q12H  . spironolactone  50 mg Oral Daily  . thiamine  100 mg Oral Daily  . torsemide  60 mg Oral Daily       Physical Exam: Vital signs in last 24 hours: Temp:  [97.9 F (36.6 C)-98.6 F (37 C)] 98.6 F (37 C) (02/01 0446) Pulse Rate:  [62-74] 74 (02/01 0446) Resp:  [18-20] 18 (02/01 0446) BP: (103-133)/(55-94) 112/55 mmHg (02/01 0446) SpO2:  [96 %-100 %] 96 % (02/01 0809) Weight:  [177 lb 8 oz (80.513 kg)] 177 lb 8 oz (80.513 kg) (02/01 0446) Weight change: 11.1 oz (0.314 kg) Last BM Date: 11/29/15  Intake/Output from previous day: 01/31 0701 - 02/01 0700 In: 1153 [P.O.:480; I.V.:3; Blood:570; IV Piggyback:100] Out: 900 [Urine:900] Total I/O In: 240 [P.O.:240] Out: -    Physical Exam: General- pt is awake,alert, oriented to time place and person Resp- No acute REsp distress, CTA B/L NO Rhonchi CVS- S1S2 regular in rate and rhythm GIT- BS+, soft, NT, ND EXT-1+ LE Edema, NO Cyanosis   Lab Results: CBC  Recent Labs  11/30/15 0520 12/01/15 0644  HGB 7.4* 9.1*  HCT 21.9* 27.0*    BMET  Recent Labs  11/30/15 0520 12/01/15 0644  NA 143 142  K 4.2 4.4  CL 110 110  CO2 24 25  GLUCOSE 93 91  BUN 42* 41*  CREATININE 1.67* 1.66*  CALCIUM 8.2* 8.0*   Creat trend  2017 1.5=>1.75=>1.67 2016  1.4--2.0( Peak creat of 2.0 in August and December)    MICRO No results  found for this or any previous visit (from the past 240 hour(s)).    Lab Results  Component Value Date   CALCIUM 8.0* 12/01/2015        Impression: 1)Renal  AKI secondary to Prerenal/ATN                Creat at plateau                 AKI vs CKD               CKD stage 3.               CKD since 2016               CKD secondary to HTN /Multiple AKI                Progression of CKD marked with multiple AKI                2)HTN  Medication-  On Diuretics On Beta blockers    3)Anemia HGb low Anemia of chronic diseas received transfusion  4)GI- admitted with Cirrhosis   Primary team and GI following   5)CNS-admitted with hepatic encephalopathy  Primary  MD following  6)Electrolytes Normokalemic NOrmonatremic   7)Acid base Co2 at goal     Plan:  Will continue current care If pt dc/ed home, will see as outpt.     BHUTANI,MANPREET S 12/01/2015, 10:38 AM

## 2015-12-01 NOTE — Discharge Summary (Signed)
Physician Discharge Summary  Patient ID: Savannah Jackson MRN: [id number] DOB/AGE: 1956-07-09 60 y.o. Primary [employer], MD Admit date: 11/26/2015 Discharge date: 12/01/2015    Discharge Diagnoses:   Principal Problem:   Anasarca Active Problems:   Hypertension   Cirrhosis, alcoholic (HCC)   ETOH abuse   Stage III chronic kidney disease   Peripheral edema   Anemia of chronic disease   Alcoholic cirrhosis of liver 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) anemia Acute on chronic renal failure   Medication List    STOP taking these medications        furosemide 40 MG tablet  Commonly known as:  LASIX      TAKE these medications        folic acid 1 MG tablet  Commonly known as:  FOLVITE  Take 1 tablet (1 mg total) by mouth daily.     lactulose 10 GM/15ML solution  Commonly known as:  CHRONULAC  Take 22.5 mLs (15 g total) by mouth 4 (four) times daily. Four times a day     nadolol 20 MG tablet  Commonly known as:  CORGARD  Take 10 mg by mouth daily.     rifaximin 550 MG Tabs tablet  Commonly known as:  XIFAXAN  Take 550 mg by mouth 2 (two) times daily.     spironolactone 50 MG tablet  Commonly known as:  ALDACTONE  Take 1 tablet (50 mg total) by mouth daily.     thiamine 100 MG tablet  Take 1 tablet (100 mg total) by mouth daily.     torsemide 20 MG tablet  Commonly known as:  DEMADEX  Take 3 tablets (60 mg total) by mouth daily.        Discharged Condition:     Consults: GI/Nephrology  Significant Diagnostic Studies: Dg Chest 2 View  11/26/2015  CLINICAL DATA:  Both summed edema for 1 week.  Shortness of breath. EXAM: CHEST  2 VIEW COMPARISON:  10/19/2015 FINDINGS: There is no focal parenchymal opacity. There is a trace right pleural effusion. There is no pneumothorax. The heart and mediastinal contours are unremarkable. The osseous structures are unremarkable. IMPRESSION: Trace right pleural effusion. Electronically Signed   By: Kathreen Devoid   On:  11/26/2015 10:28   US Abdomen Complete  11/29/2015  CLINICAL DATA:  60 year old female with alcoholic cirrhosis and history of TIPS creation 6 months previously. EXAM: ABDOMEN ULTRASOUND COMPLETE; LIVER DUPLEX TIPS STUDY COMPARISON:  Prior duplex ultrasound 08/03/2015 FINDINGS: Gallbladder: Surgically absent Common bile duct: Diameter: 0.85 mm Liver: Diffusely heterogeneous hepatic parenchyma with coarsening of the echotexture and faint nodularity of the liver surface. No discrete mass or lesion. IVC: No abnormality visualized. Pancreas: Visualized portion unremarkable. Spleen: Size and appearance within normal limits. Right Kidney: Length: 11.9 cm. Echogenicity within normal limits. No mass or hydronephrosis visualized. Left Kidney: Length: 11.4 cm. Echogenicity within normal limits. No mass or hydronephrosis visualized. Abdominal aorta: No aneurysm visualized. Other findings: None. LIVER DUPLEX VASCULAR TIPS STUDY TIPS Shunt Velocities Proximal: 90 cm/sec Mid:  163 Distal:  71 cm/sec Portal Vein Velocities Main:  62 Cm/sec with normal hepatopetal directional flow. Right:  70 cm/sec with normal hepatopetal directional flow. Left:  42 cm/sec with normal hepatopetal directional flow. Hepatic Vein Velocities Right:  Not well seen Middle:  17 cm/sec Left:  30 cm/sec Hepatic Artery Velocity:  113 centimeters/second Splenic Vein Velocity:  24 centimeters/second Varices:  None visible Ascites: None Other findings: Small right pleural effusion. IMPRESSION: 1. Widely patent right  hepatic to right portal venous TIPS without evidence of complication. 2. Portal venous flow maintains normal hepatopetal direction. 3. No evidence of ascites. 4. There is a small to moderate right pleural effusion. 5. Morphologically cirrhotic liver. 6. No discrete liver lesion or mass. 7. Surgical changes of prior cholecystectomy. 8. Borderline common bile duct dilatation at 8.5 mm may be secondary to prior cholecystectomy status. Signed, Criselda Peaches, MD [facility] Radiology Electronically Signed   By: Jacqulynn Cadet M.D.   On: 11/29/2015 16:36   [place] Art/ven Flow Abd Pelv Doppler Limited  11/29/2015  CLINICAL DATA:  60 year old female with alcoholic cirrhosis and history of TIPS creation 6 months previously. EXAM: ABDOMEN ULTRASOUND COMPLETE; LIVER DUPLEX TIPS STUDY COMPARISON:  Prior duplex ultrasound 08/03/2015 FINDINGS: Gallbladder: Surgically absent Common bile duct: Diameter: 0.85 mm Liver: Diffusely heterogeneous hepatic parenchyma with coarsening of the echotexture and faint nodularity of the liver surface. No discrete mass or lesion. IVC: No abnormality visualized. Pancreas: Visualized portion unremarkable. Spleen: Size and appearance within normal limits. Right Kidney: Length: 11.9 cm. Echogenicity within normal limits. No mass or hydronephrosis visualized. Left Kidney: Length: 11.4 cm. Echogenicity within normal limits. No mass or hydronephrosis visualized. Abdominal aorta: No aneurysm visualized. Other findings: None. LIVER DUPLEX VASCULAR TIPS STUDY TIPS Shunt Velocities Proximal: 90 cm/sec Mid:  163 Distal:  71 cm/sec Portal Vein Velocities Main:  62 Cm/sec with normal hepatopetal directional flow. Right:  70 cm/sec with normal hepatopetal directional flow. Left:  42 cm/sec with normal hepatopetal directional flow. Hepatic Vein Velocities Right:  Not well seen Middle:  17 cm/sec Left:  30 cm/sec Hepatic Artery Velocity:  113 centimeters/second Splenic Vein Velocity:  24 centimeters/second Varices:  None visible Ascites: None Other findings: Small right pleural effusion. IMPRESSION: 1. Widely patent right hepatic to right portal venous TIPS without evidence of complication. 2. Portal venous flow maintains normal hepatopetal direction. 3. No evidence of ascites. 4. There is a small to moderate right pleural effusion. 5. Morphologically cirrhotic liver. 6. No discrete liver lesion or  mass. 7. Surgical changes of prior cholecystectomy. 8. Borderline common bile duct dilatation at 8.5 mm may be secondary to prior cholecystectomy status. Signed, Criselda Peaches, MD [facility] Radiology Electronically Signed   By: Jacqulynn Cadet M.D.   On: 11/29/2015 16:36    Lab Results: Basic Metabolic Panel:  Recent Labs  11/30/15 0520 12/01/15 0644  NA 143 142  K 4.2 4.4  CL 110 110  CO2 24 25  GLUCOSE 93 91  BUN 42* 41*  CREATININE 1.67* 1.66*  CALCIUM 8.2* 8.0*   Liver Function Tests:  Recent Labs  11/29/15 0646 11/30/15 0520  AST 43* 41  ALT 13* 12*  ALKPHOS 147* 136*  BILITOT 1.0 1.0  PROT 4.2* 4.3*  ALBUMIN 2.4* 2.6*     CBC:  Recent Labs  11/30/15 0520 12/01/15 0644  HGB 7.4* 9.1*  HCT 21.9* 27.0*    No results found for this or any previous visit (from the past 240 hour(s)).   Hospital Course:   This is a 60 years old female with history of multiple medical illnesses was admitted due to anasarca secondary to alcoholic liver cirrhosis. Patient was diuresed. She was also transfused two units of PRBC. Nephrology and GI assisted in her management. Patient improved and discharged to be followed in out patient.  Discharge Exam: Blood pressure 112/55, pulse 74, temperature 98.6 F (37 C), temperature source Oral,  resp. rate 18, height 5\' 3"  (1.6 m), weight 80.513 kg (177 lb 8 oz), SpO2 96 %.   Disposition:  home        Follow-up Information    Follow up with [facility], MD In 2 weeks.   Specialty:  Nephrology   Contact information:   [address] [address] [phone]       Signed: Anagabriela Jokerst   12/01/2015, 8:07 AM

## 2015-12-01 NOTE — Care Management Important Message (Signed)
Important Message  Patient Details  Name: Savannah Jackson MRN: [id number] Date of Birth: 01-14-1956   Medicare Important Message Given:  Yes    Alvie Heidelberg, RN 12/01/2015, 9:20 AM

## 2015-12-01 NOTE — Progress Notes (Signed)
Pt discharged home today per Dr. Legrand Rams.  IV site D/C'd and WDL.  Pt's VSS.  Pt provided with home medication list, discharge instructions and prescriptions.  Verbalized understanding.  Pt left floor via WC in stable condition accompanied by NT.

## 2015-12-01 NOTE — Progress Notes (Signed)
   Subjective:    Patient ID: Savannah Jackson, female    DOB: 05/24/1956, 60 y.o.   MRN: [id number]  Shortness of Breath  Sitting on bed. She continues to have edema to her lower extremities just above her knees. (3+). She denies any confusion. Ate all of dinner last night. She did have a BM last night and she says this was collected by the nurse. Blood pressure  112/55, pulse 74, temperature 98.6 F (37 C), temperature source Oral, resp. rate 18, height 5\' 3"  (1.6 m), weight 177 lb 8 oz (80.513 kg), SpO2 96 %. I/O last 3 completed shifts: In: [id number] [P.O.:480; I.V.:3; Blood:570; IV Piggyback:200] Out: 1450 [Urine:1450]    Hepatic Function Panel     Component Value Date/Time   PROT 4.3* 11/30/2015 0520   ALBUMIN 2.6* 11/30/2015 0520   AST 41 11/30/2015 0520   ALT 12* 11/30/2015 0520   ALKPHOS 136* 11/30/2015 0520   BILITOT 1.0 11/30/2015 0520   BILIDIR 0.4* 08/10/2015 1155   IBILI 1.0 08/10/2015 1155      #1. Fluid overload. Urine output 1450.    . Advanced alcoholic cirrhosis. Patient has an appointment in [facility] and 12/07/2015. MELD score is 17. Urine output has increased. #5. Hepatic encephalopathy. Patient is on lactulose and Xifaxan Anemia: She has received 2 units of PRBCs. One hemocult has been sent to lab.       Review of Systems  Respiratory: Positive for shortness of breath.        Objective:   Physical Exam        Assessment & Plan:

## 2015-12-03 ENCOUNTER — Other Ambulatory Visit: Payer: Self-pay | Admitting: *Deleted

## 2015-12-03 NOTE — Patient Outreach (Signed)
12/03/15- Telephone call to patient for transition of care week 1 (pt discharged from hospital on 12/01/15),  No answer to telephone, left voicemail requesting return phone call.  Jacqlyn Larsen [facility], Ward Coordinator [phone]

## 2015-12-06 ENCOUNTER — Encounter: Payer: Self-pay | Admitting: *Deleted

## 2015-12-06 ENCOUNTER — Other Ambulatory Visit: Payer: Self-pay | Admitting: *Deleted

## 2015-12-06 NOTE — Patient Outreach (Signed)
12/06/15- Telephone call to patient for transition of care week 1 (2nd attempt), spoke with pt, HIPAA verified, pt states she refused home health and is refusing home visit by [facility] RN CM, pt reports "not much swelling, no more than usual", denies confusion, pt to see MD at [place] tomorrow 12/07/15 for consultation regarding liver transplant. Pt agreeable to weekly transition of care calls.  RN CM faxed barrier letter and today's transition of care note to primary MD Dr. Fanta.  Medications Reviewed Today    Reviewed by  A , RN (Registered Nurse) on 12/06/15 at 1327  Med List Status: <None>   Medication Order Taking? Sig Documenting Provider Last Dose Status Informant   folic acid (FOLVITE) 1 MG tablet [id number] Yes Take 1 tablet (1 mg total) by mouth daily. Tesfaye Fanta, MD Taking Active Self   lactulose (CHRONULAC) 10 GM/15ML solution [id number] Yes Take 22.5 mLs (15 g total) by mouth 4 (four) times daily. Four times a day Najeeb U Rehman, MD Taking Active Self   nadolol (CORGARD) 20 MG tablet [id number] Yes Take 10 mg by mouth daily.  Historical Provider, MD Taking Active Self             Med Note (TODD, TAMMY M   Tue Nov 09, 2015  9:00 AM): Received from: External Pharmacy    rifaximin (XIFAXAN) 550 MG TABS tablet [id number] Yes Take 550 mg by mouth 2 (two) times daily. Heath McCullough, MD Taking Active Self             Med Note (HODGE, KIMILEE P   Wed Oct 20, 2015  9:40 AM):      spironolactone (ALDACTONE) 50 MG tablet [id number] Yes Take 1 tablet (50 mg total) by mouth daily. Tesfaye Fanta, MD Taking Active    thiamine 100 MG tablet [id number] Yes Take 1 tablet (100 mg total) by mouth daily. Tesfaye Fanta, MD Taking Active Self   torsemide (DEMADEX) 20 MG tablet [id number] Yes Take 3 tablets (60 mg total) by mouth daily. Tesfaye Fanta, MD Taking Active    Med List Note (Dana K Ashe, CPHT 10/19/15 2321): Medications have been  Verified via pharmacy records          [facility] CM Care Plan  Problem One        Most Recent Value   Care Plan Problem One  knowledge deficit related to disease process Cirrhosis   Role Documenting the Problem One  Care Management Coordinator   Care Plan for Problem One  Active   [facility] Long Term Goal (31-90 days)  pt will have no hospital readmissions within 90 days   [facility] Long Term Goal Start Date  12/06/15 [goal restarted]   Interventions for Problem One Long Term Goal  RN CM reviewed action plan and importance of taking all medications as prescribed, reviewed signs/ symptoms elevated ammonia levels, exacerbation of symptoms such as swelling, etc.   [facility] CM Short Term Goal #1 Start Date  -- [goal restarted]   [facility] CM Short Term Goal #1 Met Date  -- [pt saw MD 09/13/15]    [facility] CM Care Plan Problem Two        Most Recent Value   Care Plan Problem Two  Pt desires to have increased endurance "and do the things I used to do"   Role Documenting the Problem Two  Care Management Coordinator   Care Plan for Problem Two  Active   [facility] CM Short Term Goal #1 (0-30 days)    pt will verbalize increased endurance with activity tolerance within 30 days.   [facility] CM Short Term Goal #1 Start Date  12/06/15 [goal restarted]   Interventions for Short Term Goal #2   RN CM reiterated with pt of importance of walking or doing some type of activity daily.      PLAN Continue weekly transition of care calls.    RNC, BSN [facility] Community Care Coordinator [phone]   

## 2015-12-07 DIAGNOSIS — K7460 Unspecified cirrhosis of liver: Secondary | ICD-10-CM | POA: Diagnosis not present

## 2015-12-09 DIAGNOSIS — F1020 Alcohol dependence, uncomplicated: Secondary | ICD-10-CM | POA: Diagnosis not present

## 2015-12-13 ENCOUNTER — Other Ambulatory Visit: Payer: Self-pay | Admitting: *Deleted

## 2015-12-13 ENCOUNTER — Ambulatory Visit: Payer: Self-pay | Admitting: *Deleted

## 2015-12-13 NOTE — Patient Outreach (Addendum)
12/13/15- Telephone call to patient for transition of care week 2, no answer to telephone, left voicemail requesting return phone call.  Jacqlyn Larsen [facility], [place] Coordinator [phone]

## 2015-12-14 ENCOUNTER — Other Ambulatory Visit (HOSPITAL_COMMUNITY)
Admission: RE | Admit: 2015-12-14 | Discharge: 2015-12-14 | Disposition: A | Discharge status: Home / Self Care | Payer: Commercial Managed Care - HMO | Source: Ambulatory Visit | Attending: Interventional Radiology | Admitting: Interventional Radiology

## 2015-12-14 DIAGNOSIS — K7031 Alcoholic cirrhosis of liver with ascites: Secondary | ICD-10-CM | POA: Diagnosis not present

## 2015-12-14 DIAGNOSIS — I10 Essential (primary) hypertension: Secondary | ICD-10-CM | POA: Diagnosis not present

## 2015-12-14 DIAGNOSIS — Z95828 Presence of other vascular implants and grafts: Secondary | ICD-10-CM | POA: Diagnosis not present

## 2015-12-14 DIAGNOSIS — D684 Acquired coagulation factor deficiency: Secondary | ICD-10-CM | POA: Diagnosis not present

## 2015-12-14 DIAGNOSIS — R601 Generalized edema: Secondary | ICD-10-CM | POA: Diagnosis not present

## 2015-12-14 LAB — COMPREHENSIVE METABOLIC PANEL
ALT: 19 U/L (ref 14–54)
AST: 59 U/L — AB (ref 15–41)
Albumin: 2 g/dL — ABNORMAL LOW (ref 3.5–5.0)
Alkaline Phosphatase: 194 U/L — ABNORMAL HIGH (ref 38–126)
Anion gap: 6 (ref 5–15)
BUN: 38 mg/dL — ABNORMAL HIGH (ref 6–20)
CALCIUM: 8.3 mg/dL — AB (ref 8.9–10.3)
CHLORIDE: 109 mmol/L (ref 101–111)
CO2: 23 mmol/L (ref 22–32)
Creatinine, Ser: 1.39 mg/dL — ABNORMAL HIGH (ref 0.44–1.00)
GFR, EST AFRICAN AMERICAN: 47 mL/min — AB (ref >60)
GFR, EST NON AFRICAN AMERICAN: 40 mL/min — AB (ref >60)
Glucose, Bld: 97 mg/dL (ref 65–99)
POTASSIUM: 4.9 mmol/L (ref 3.5–5.1)
SODIUM: 138 mmol/L (ref 135–145)
TOTAL PROTEIN: 5 g/dL — AB (ref 6.5–8.1)
Total Bilirubin: 1.2 mg/dL (ref 0.3–1.2)

## 2015-12-14 LAB — PROTIME-INR
INR: 1.3 (ref 0.00–1.49)
PROTHROMBIN TIME: 16.3 s — AB (ref 11.6–15.2)

## 2015-12-14 LAB — CBC
HEMATOCRIT: 31.9 % — AB (ref 36.0–46.0)
HEMOGLOBIN: 10.7 g/dL — AB (ref 12.0–15.0)
MCH: 28.2 pg (ref 26.0–34.0)
MCHC: 33.5 g/dL (ref 30.0–36.0)
MCV: 84.2 fL (ref 78.0–100.0)
Platelets: 114 10*3/uL — ABNORMAL LOW (ref 150–400)
RBC: 3.79 MIL/uL — ABNORMAL LOW (ref 3.87–5.11)
RDW: 15.7 % — AB (ref 11.5–15.5)
WBC: 6.6 10*3/uL (ref 4.0–10.5)

## 2015-12-15 DIAGNOSIS — F1020 Alcohol dependence, uncomplicated: Secondary | ICD-10-CM | POA: Diagnosis not present

## 2015-12-17 ENCOUNTER — Other Ambulatory Visit: Payer: Self-pay | Admitting: *Deleted

## 2015-12-17 NOTE — Patient Outreach (Signed)
12/17/15- Telephone call received from patient stating she went to [facility] for consultation and "mostly we talked and they want me to get hepatitis B injections because if I got that my liver couldn't take it"  Pt states she will need alcohol screening and toxicology, MD request pt to have mental health counseling and she has already gone to [facility] for assessment and meeting.  Pt reports she did have "lots of labwork" done.  Pt reports [facility] in [place] can give the injection and they have the prescription but [employer] did not approve.  RN CM called [facility], spoke with Gabriel Cirri who reports they have the prescription for series of 3 injections and they are 58.17 each, she states part D will reimburse if MD sends form to [facility] stating medical necessity of injection, she states they have already faxed form to Dr. Legrand Rams and they will need the authorization.  RN CM called Dr. Josephine Cables office, spoke with Creta Levin and she states they have the form and MD is in the process of completing.  RN CM called patient back and informed.   Transition of care week 2- pt reports she has all medications and taking as prescribed, pt reports " I have no swellling, I"m doing really good with that"  Pt reports " I'm all on board with getting liver transplant, I"m gonna do everything I can"  [facility] CM Care Plan Problem One        Most Recent Value   Care Plan Problem One  knowledge deficit related to disease process Cirrhosis   Role Documenting the Problem One  Care Management Coordinator   Care Plan for Problem One  Active   [facility] Long Term Goal (31-90 days)  pt will have no hospital readmissions within 45 days   [facility] Long Term Goal Start Date  12/06/15 [goal restarted]   Interventions for Problem One Long Term Goal  RN CM reviewed action plan, symptom managment, calling MD early for change in health status such as confusion, swelling, RN CM ask pt to call if she has any issues with medications or not being able to obtain  medications., RN CM reviewed low salt diet adherence, reviewed foods high in sodium to limit/ avoid.   [facility] CM Short Term Goal #1 Start Date  -- [goal restarted]   [facility] CM Short Term Goal #1 Met Date  -- [pt saw MD 09/13/15]    [facility] CM Care Plan Problem Two        Most Recent Value   [facility] CM Short Term Goal #1 Start Date  -- [goal restarted]      Jacqlyn Larsen [facility], BSN [place] Coordinator [place] [facility], [place] Coordinator [phone]

## 2015-12-19 IMAGING — US US THORACENTESIS ASP PLEURAL SPACE W/IMG GUIDE
1 series · 4 of 4 positions shown · non-contrast
Comparison: Prior thoracentesis on 06/16/2015

MEDICATIONS:
None

COMPLICATIONS:
None immediate

INDICATION: Cirrhosis, dyspnea, prior history of breast cancer, recurrent right
pleural effusion/ probable right hepatic hydrothorax ; request is
made for diagnostic and therapeutic right thoracentesis.

EXAM:
ULTRASOUND GUIDED DIAGNOSTIC AND THERAPEUTIC RIGHT THORACENTESIS
TECHNIQUE: Informed written consent was obtained from the patient after a
discussion of the risks, benefits and alternatives to treatment. A
timeout was performed prior to the initiation of the procedure.

[Series 1: us thoracentesis asp pleural space w/img guide · 0.29mm/px · 4 of 4 slices shown]
[im 1/4]
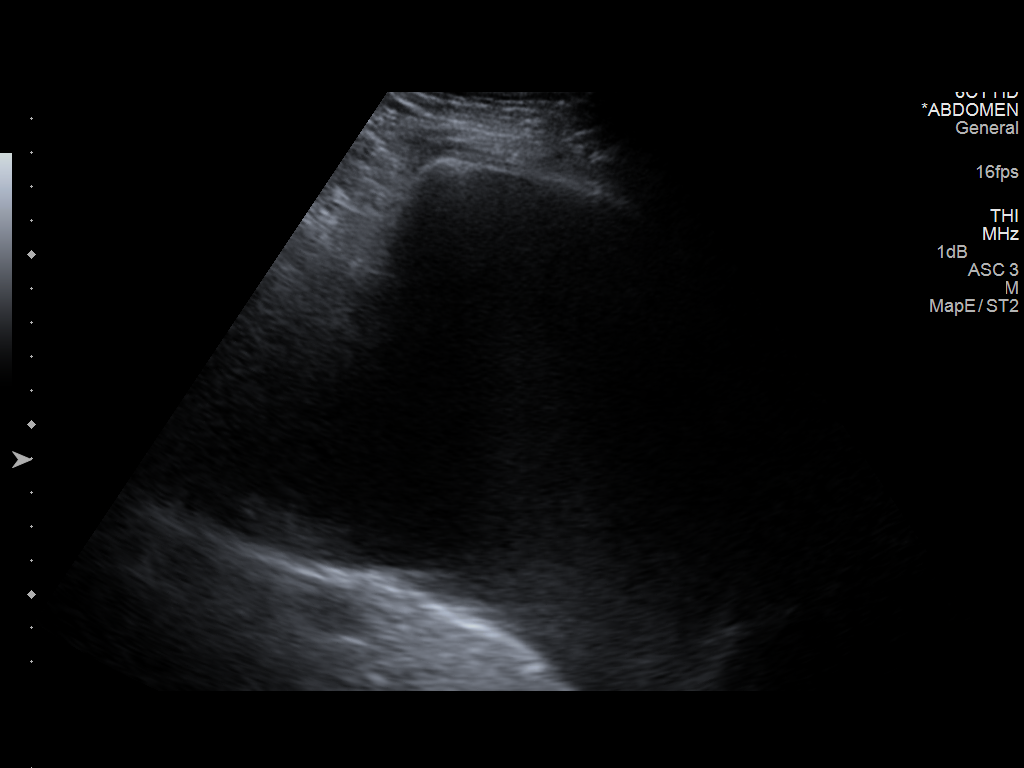
[im 2/4]
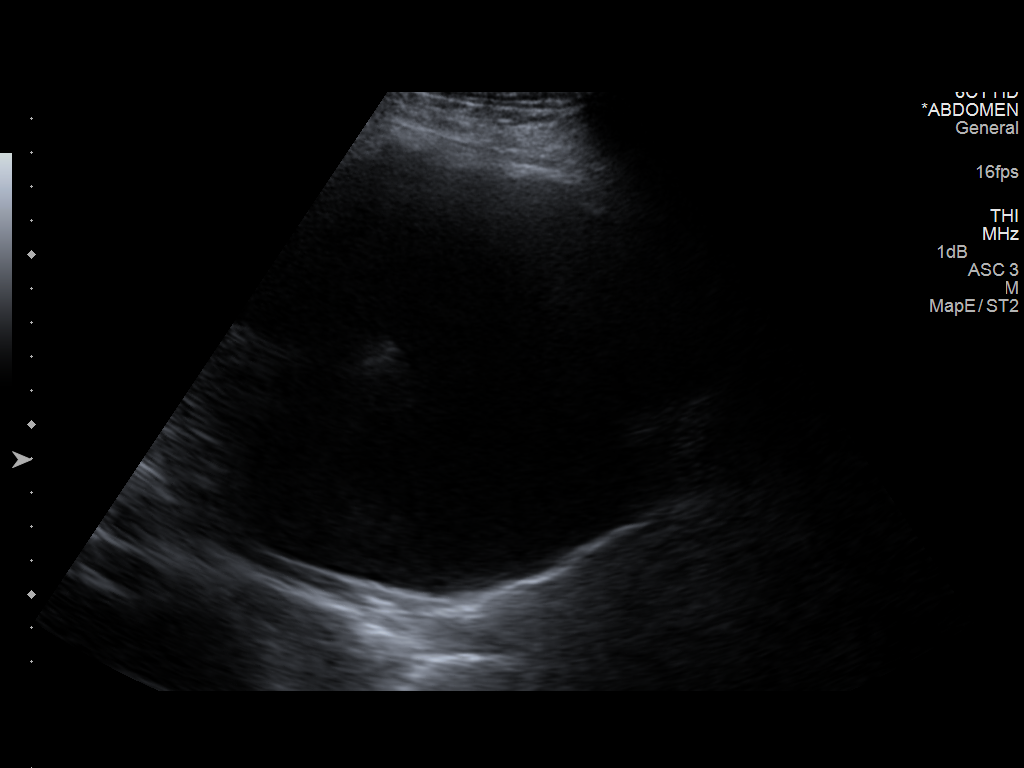
[im 3/4]
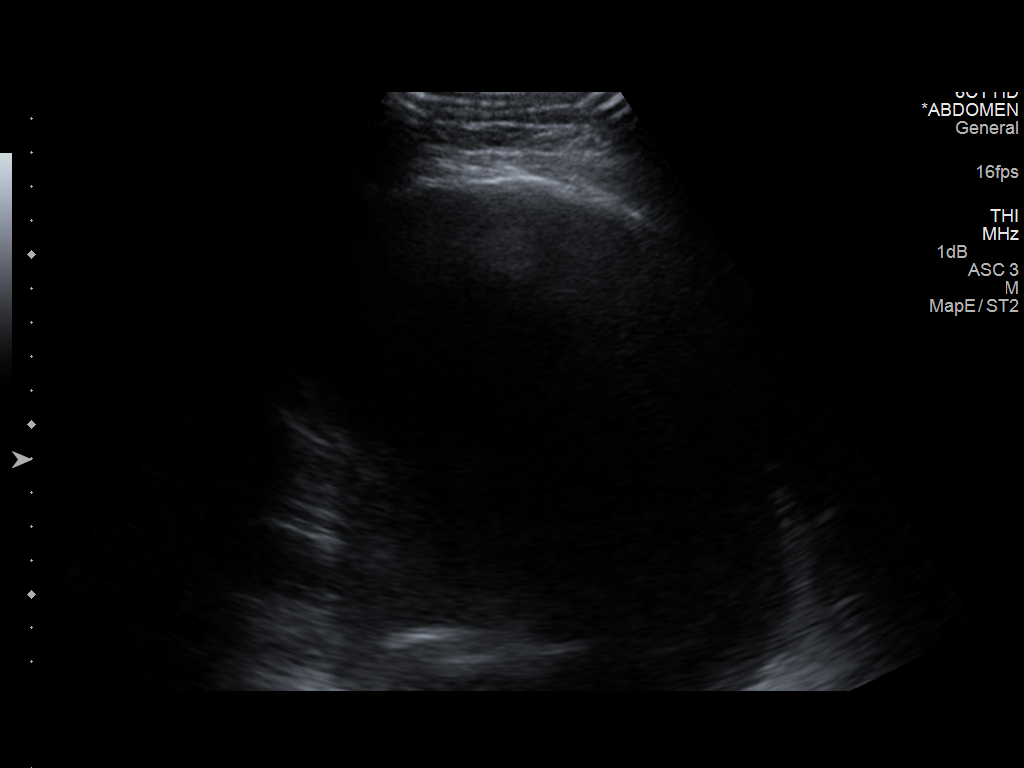
[im 4/4]
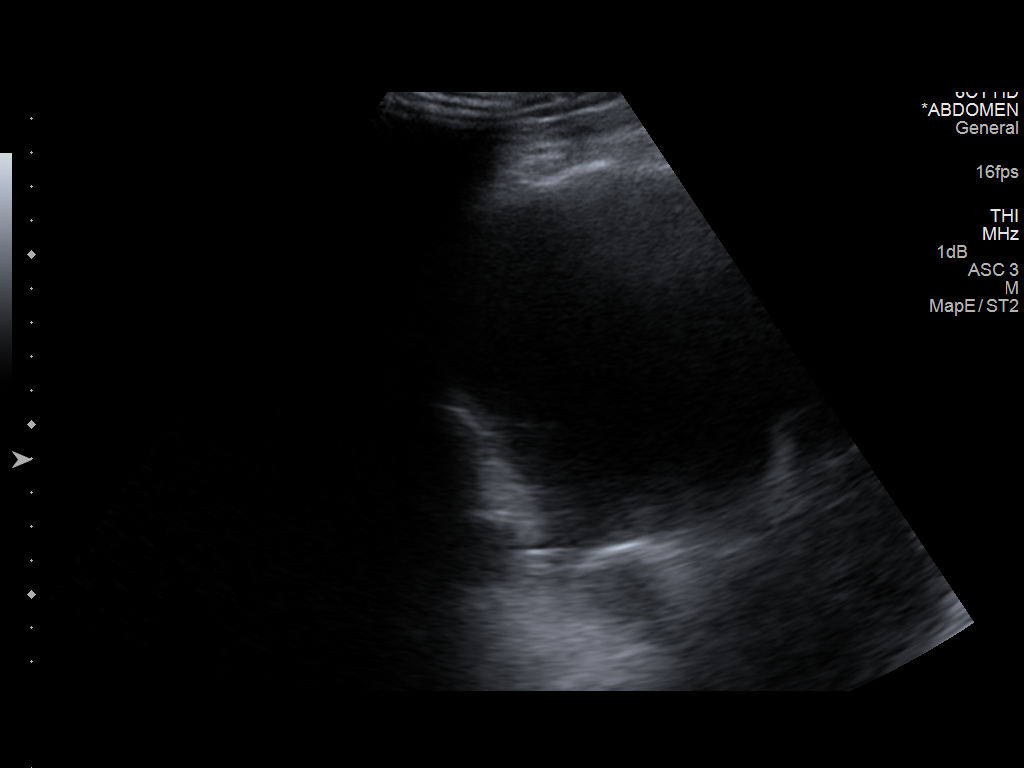

[4 of 4 positions shown; findings below may reference images not displayed]

Initial ultrasound scanning demonstrates a large right pleural
effusion. The lower chest was prepped and draped in the usual
sterile fashion. 1% lidocaine was used for local anesthesia.

An ultrasound image was saved for documentation purposes. A 6 Fr
Safe-T-Centesis catheter was introduced. The thoracentesis was
performed. The catheter was removed and a dressing was applied. The
patient tolerated the procedure well without immediate post
procedural complication. The patient was escorted to have an upright
chest radiograph.
FINDINGS: A total of approximately 2.2 liters of hazy, yellow fluid was
removed. Requested samples were sent to the laboratory. Only the
above amount of fluid was removed at this time secondary to
increased patient coughing.
IMPRESSION: Successful ultrasound-guided diagnostic and therapeutic right sided
thoracentesis yielding 2.2 liters of pleural fluid.

## 2015-12-20 ENCOUNTER — Ambulatory Visit: Payer: Self-pay | Admitting: *Deleted

## 2015-12-21 ENCOUNTER — Other Ambulatory Visit: Payer: Commercial Managed Care - HMO

## 2015-12-21 ENCOUNTER — Ambulatory Visit
Admission: RE | Admit: 2015-12-21 | Discharge: 2015-12-21 | Disposition: A | Discharge status: Home / Self Care | Payer: Commercial Managed Care - HMO | Source: Ambulatory Visit | Attending: Interventional Radiology | Admitting: Interventional Radiology

## 2015-12-21 DIAGNOSIS — I8500 Esophageal varices without bleeding: Secondary | ICD-10-CM | POA: Insufficient documentation

## 2015-12-21 DIAGNOSIS — J918 Pleural effusion in other conditions classified elsewhere: Secondary | ICD-10-CM

## 2015-12-21 DIAGNOSIS — J90 Pleural effusion, not elsewhere classified: Secondary | ICD-10-CM

## 2015-12-21 DIAGNOSIS — K7031 Alcoholic cirrhosis of liver with ascites: Secondary | ICD-10-CM | POA: Diagnosis not present

## 2015-12-21 DIAGNOSIS — K769 Liver disease, unspecified: Principal | ICD-10-CM

## 2015-12-21 NOTE — Progress Notes (Signed)
Chief Complaint: Patient was seen in consultation today for ascites and hepatic hydrothorax s/p TIPS at the request of [place]  Referring Physician(s): Caulder Wehner  History of Present Illness: Savannah Jackson is a 60 y.o. 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 Following failure of maximal medical therapy and frequent paracentesis and thoracentesis, she ultimately underwent an elective TIPS procedure on 07/16/2015.  She has done extremely well regarding her fluid buildup since the time of that procedure. She has required no additional paracentesis or thoracentesis. Her most recent chest x-ray from January 2017 demonstrates only a tiny residual right pleural effusion. This is asymptomatic. She denies shortness of breath, cough, or fatigue.  Her TIPS procedure was complicated by development of hepatic encephalopathy which has been well managed on both rifaximin and lactulose. She does complain that she is going to the bathroom 4 or 5 times daily secondary to the lactulose which interferes with her activities of daily living.  She is currently taking lactulose 4 times daily.  She had an episode of significant bilateral lower extremity swelling in the setting of renal failure this past January requiring hospital admission. Her renal failure has since resolved and she is doing well on torsemide. Her lower extremity edema has almost completely resolved. She has some persistent swelling at the ankles and the dorsum of the feet as well as some new hyperpigmentation of the skin.  She is being evaluated for liver transplant at [facility]. She has a follow-up appointment with nephrology on March 1.  Past Medical History  Diagnosis Date  . Hypertension   . Brain aneurysm   . Rotator cuff syndrome of left shoulder   . DDD (degenerative disc disease), lumbar   . Chronic back pain  Diverticultis  . Cirrhosis ([place])     alcoholic  . Sleep apnea   . ETOH abuse   . Bilateral leg edema   . Hepatomegaly     scope 2014  . Diverticulosis     scope 2014  . Cancer [facility])     breast cancer    Past Surgical History  Procedure Laterality Date  . Cholecystectomy    . Brain surgery    . Breast surgery    . Total knee arthroplasty      right. 2002  . Cataract extraction Bilateral     [facility] 2 or 3 years ago  . Colonoscopy N/A 04/10/2013    Procedure: COLONOSCOPY;  Surgeon: Rogene [place], MD;  [facility];  Service: Endoscopy;  Laterality: N/A;  1030-rescheduled to 1200 Ann notified pt  . Esophagogastroduodenoscopy N/A 04/22/2015    Procedure: ESOPHAGOGASTRODUODENOSCOPY (EGD);  Surgeon: Rogene [place], MD;  Location: [facility];  Service: Endoscopy;  Laterality: N/A;  . Radiology with anesthesia N/A 07/16/2015    Procedure: TIPS;  Surgeon: Medication Radiologist, MD;  Location: [place];  Service: Radiology;  Laterality: N/A;    Allergies: Latex; Tylenol; and Vicodin  Medications: Prior to Admission medications   Medication Sig Start Date End Date Taking? Authorizing Provider  folic acid (FOLVITE) 1 MG tablet Take 1 tablet (1 mg total) by mouth daily. 04/29/15   Rosita Fire, MD  lactulose (CHRONULAC) 10 GM/15ML solution Take 22.5 mLs (15 g total) by mouth 4 (four) times daily. Four times a day 11/23/15   Rogene [place], MD  nadolol (CORGARD) 20 MG tablet Take 10 mg by mouth daily.  11/02/15  Historical Provider, MD  rifaximin (XIFAXAN) 550 MG TABS tablet Take 550 mg by mouth 2 (two) times daily.    Jacqulynn Cadet, MD  spironolactone (ALDACTONE) 50 MG tablet Take 1 tablet (50 mg total) by mouth daily. 12/01/15   Rosita Fire, MD  thiamine 100 MG tablet Take 1 tablet (100 mg total) by mouth daily. 04/29/15   Rosita Fire, MD  torsemide (DEMADEX) 20 MG tablet Take 3 tablets (60 mg total) by mouth daily. 12/01/15   Rosita Fire, MD     No family history on  file.  Social History   Social History  . Marital Status: Married    Spouse Name: N/A  . Number of Children: N/A  . Years of Education: N/A   Social History Main Topics  . Smoking status: Never Smoker   . Smokeless tobacco: Never Used  . Alcohol Use: No     Comment: none since 03/2015  . Drug Use: No  . Sexual Activity: Yes    Birth Control/ Protection: None   Other Topics Concern  . 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 However he is fairly capable of taking care of himself at home. This couple have an attentive daughter who keeps an eye on both of them.    Review of Systems: A 12 point ROS discussed and pertinent positives are indicated in the HPI above.  All other systems are negative.  Review of Systems  Vital Signs: BP 109/69 mmHg  Pulse 75  Temp(Src) 97.9 F (36.6 C)  Resp 18  SpO2 100%  Physical Exam  Constitutional: She is oriented to person, place, and time. She appears well-developed and well-nourished. No distress.  Eyes: No scleral icterus.  Cardiovascular: Normal rate and regular rhythm.   Pulmonary/Chest: Effort normal.  Abdominal: Soft. She exhibits no distension. There is no tenderness.  Musculoskeletal: She exhibits edema.       Right ankle: She exhibits swelling.       Left ankle: She exhibits swelling.  Neurological: She is alert and oriented to person, place, and time.  No asterixis  Skin: Skin is warm and dry.  Psychiatric: She has a normal mood and affect. Her behavior is normal.  Nursing note and vitals reviewed.   Mallampati Score:     Imaging: Dg Chest 2 View  11/26/2015  CLINICAL DATA:  Both summed edema for 1 week.  Shortness of breath. EXAM: CHEST  2 VIEW COMPARISON:  10/19/2015 FINDINGS: There is no focal parenchymal opacity. There is a trace right pleural effusion. There is no pneumothorax. The heart and mediastinal contours are unremarkable.  The osseous structures are unremarkable. IMPRESSION: Trace right pleural effusion. Electronically Signed   By: Kathreen Devoid   On: 11/26/2015 10:28   US Abdomen Complete  11/29/2015  CLINICAL DATA:  60 year old female with alcoholic cirrhosis and history of TIPS creation 6 months previously. EXAM: ABDOMEN ULTRASOUND COMPLETE; LIVER DUPLEX TIPS STUDY COMPARISON:  Prior duplex ultrasound 08/03/2015 FINDINGS: Gallbladder: Surgically absent Common bile duct: Diameter: 0.85 mm Liver: Diffusely heterogeneous hepatic parenchyma with coarsening of the echotexture and faint nodularity of the liver surface. No discrete mass or lesion. IVC: No abnormality visualized. Pancreas: Visualized portion unremarkable. Spleen: Size and appearance within normal limits. Right Kidney: Length: 11.9 cm. Echogenicity within normal limits. No mass or hydronephrosis visualized. Left Kidney: Length: 11.4 cm. Echogenicity within normal limits. No mass or hydronephrosis visualized. Abdominal aorta: No  aneurysm visualized. Other findings: None. LIVER DUPLEX VASCULAR TIPS STUDY TIPS Shunt Velocities Proximal: 90 cm/sec Mid:  163 Distal:  71 cm/sec Portal Vein Velocities Main:  62 Cm/sec with normal hepatopetal directional flow. Right:  70 cm/sec with normal hepatopetal directional flow. Left:  42 cm/sec with normal hepatopetal directional flow. Hepatic Vein Velocities Right:  Not well seen Middle:  17 cm/sec Left:  30 cm/sec Hepatic Artery Velocity:  113 centimeters/second Splenic Vein Velocity:  24 centimeters/second Varices:  None visible Ascites: None Other findings: Small right pleural effusion. IMPRESSION: 1. Widely patent right hepatic to right portal venous TIPS without evidence of complication. 2. Portal venous flow maintains normal hepatopetal direction. 3. No evidence of ascites. 4. There is a small to moderate right pleural effusion. 5. Morphologically cirrhotic liver. 6. No discrete liver lesion or mass. 7. Surgical changes of prior  cholecystectomy. 8. Borderline common bile duct dilatation at 8.5 mm may be secondary to prior cholecystectomy status. Signed, Criselda Peaches, MD [facility] Electronically Signed   By: Jacqulynn Cadet M.D.   On: 11/29/2015 16:36   [place] Art/ven Flow Abd Pelv Doppler Limited  11/29/2015  CLINICAL DATA:  60 year old female with alcoholic cirrhosis and history of TIPS creation 6 months previously. EXAM: ABDOMEN ULTRASOUND COMPLETE; LIVER DUPLEX TIPS STUDY COMPARISON:  Prior duplex ultrasound 08/03/2015 FINDINGS: Gallbladder: Surgically absent Common bile duct: Diameter: 0.85 mm Liver: Diffusely heterogeneous hepatic parenchyma with coarsening of the echotexture and faint nodularity of the liver surface. No discrete mass or lesion. IVC: No abnormality visualized. Pancreas: Visualized portion unremarkable. Spleen: Size and appearance within normal limits. Right Kidney: Length: 11.9 cm. Echogenicity within normal limits. No mass or hydronephrosis visualized. Left Kidney: Length: 11.4 cm. Echogenicity within normal limits. No mass or hydronephrosis visualized. Abdominal aorta: No aneurysm visualized. Other findings: None. LIVER DUPLEX VASCULAR TIPS STUDY TIPS Shunt Velocities Proximal: 90 cm/sec Mid:  163 Distal:  71 cm/sec Portal Vein Velocities Main:  62 Cm/sec with normal hepatopetal directional flow. Right:  70 cm/sec with normal hepatopetal directional flow. Left:  42 cm/sec with normal hepatopetal directional flow. Hepatic Vein Velocities Right:  Not well seen Middle:  17 cm/sec Left:  30 cm/sec Hepatic Artery Velocity:  113 centimeters/second Splenic Vein Velocity:  24 centimeters/second Varices:  None visible Ascites: None Other findings: Small right pleural effusion. IMPRESSION: 1. Widely patent right hepatic to right portal venous TIPS without evidence of complication. 2. Portal venous flow maintains normal hepatopetal direction. 3. No evidence of  ascites. 4. There is a small to moderate right pleural effusion. 5. Morphologically cirrhotic liver. 6. No discrete liver lesion or mass. 7. Surgical changes of prior cholecystectomy. 8. Borderline common bile duct dilatation at 8.5 mm may be secondary to prior cholecystectomy status. Signed, Criselda Peaches, MD [facility] Electronically Signed   By: Jacqulynn Cadet M.D.   On: 11/29/2015 16:36    Labs:  CBC:  Recent Labs  10/21/15 0511 11/26/15 1016 11/27/15 0639 11/30/15 0500 11/30/15 0520 12/01/15 0644 12/14/15 1218  WBC 8.6 6.7 6.3  --   --   --  6.6  HGB 9.1* 8.6* 8.5* 7.6* 7.4* 9.1* 10.7*  HCT 25.5* 25.2* 25.3* 23.1* 21.9* 27.0* 31.9*  PLT 177 162 154  --   --   --  114*    COAGS:  Recent Labs  11/22/15 1124 11/26/15 1016 11/30/15 0520 12/14/15 1218  INR 1.40 1.45 1.70* 1.30    BMP:  Recent Labs  11/29/15 0646 11/30/15 0520 12/01/15 0644 12/14/15 1218  NA 141 143 142 138  K 4.2 4.2 4.4 4.9  CL 111 110 110 109  CO2 23 24 25 23   GLUCOSE 98 93 91 97  BUN 42* 42* 41* 38*  CALCIUM 7.9* 8.2* 8.0* 8.3*  CREATININE 1.75* 1.67* 1.66* 1.39*  GFRNONAA 30* 32* 33* 40*  GFRAA 35* 37* 38* 47*    LIVER FUNCTION TESTS:  Recent Labs  11/28/15 0438 11/29/15 0646 11/30/15 0520 12/14/15 1218  BILITOT 1.5* 1.0 1.0 1.2  AST 44* 43* 41 59*  ALT 14 13* 12* 19  ALKPHOS 118 147* 136* 194*  PROT 4.2* 4.2* 4.3* 5.0*  ALBUMIN 2.0* 2.4* 2.6* 2.0*    TUMOR MARKERS:  Recent Labs  11/30/15 0848  AFPTM 3.0    Assessment and Plan:  Doing well 6 months status post creation of a TIPS for medically refractory ascites and right hepatic hydrothorax. Her clinical outcome regarding fluid buildup has been exceptional. She has required no additional thoracentesis or paracentesis since of the creation of her TIPS.  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 She does complain of significant bowel movements on her current dose of lactulose. I advised her to decrease from 4 times daily to 3 times daily to carefully watch the number of bowel movements she has. I 1 her moving her bowels 3 times daily.  We also discussed the signs and symptoms of encephalopathy. If she notes any of these symptoms recurrence at 3 bowel movements daily she may need to re-increase her lactulose to 4 times.  Her screening abdominal ultrasound demonstrates no evidence of liver lesion to suggest hepatocellular cancer. Her TIPS appears widely patent.  1.) Decrease lactulose from 4 times a day to 3 times a day as discussed above. 2.) Continue rifaximin 3.) Continue torsemide and low-sodium diet 4.) I will have my staff order a screening right upper quadrant abdominal ultrasound (indication cirrhosis, evaluate for [place]) to be performed 3 months from now. This can be done as an outpatient at [facility]. 5.) Return to interventional radiology in 6 months with a repeat TIPS duplex ultrasound and liver labs.  Electronically Signed: Jacqulynn Cadet 12/21/2015, 12:14 PM   I spent a total of 15 Minutes in face to face in clinical consultation, greater than 50% of which was counseling/coordinating care for alcoholic cirrhosis complicated by encephalopathy, ascites and hepatic hydrothorax.

## 2015-12-22 ENCOUNTER — Other Ambulatory Visit: Payer: Self-pay

## 2015-12-22 ENCOUNTER — Other Ambulatory Visit: Payer: Self-pay | Admitting: *Deleted

## 2015-12-22 NOTE — Patient Outreach (Signed)
I did not hear back from Ms. Temkin so I am closing her case to pharmacy.  I am available if future pharmacy issues arise.    Deanne Coffer, PharmD, [place] [phone]

## 2015-12-22 NOTE — Patient Outreach (Signed)
12/22/15-Telephone call to patient for transition of care week 3, spoke with pt, HIPAA verified, pt states she received letter of approval from [employer] for hepatits B injections and intends to get these started.  Pt reports she has all medications on hand and taking as prescribed.  Pt states she saw Dr. McCollum in [place] yesterday "for stent in my liver"  Pt reports "swelling is under control"    RN CM emphasized low salt diet and action plan.  No new concerns reported today.  [facility] CM Care Plan Problem One        Most Recent Value   Care Plan Problem One  knowledge deficit related to disease process Cirrhosis   Role Documenting the Problem One  Care Management Coordinator   Care Plan for Problem One  Active   [facility] Long Term Goal (31-90 days)  pt will have no hospital readmissions within 45 days   [facility] Long Term Goal Start Date  12/06/15 [goal restarted]   Interventions for Problem One Long Term Goal  RN CM reviewed low sodium food choices, action plan for cirrhosis and importance of calling MD early for change in health status such as increased edema.   [facility] CM Short Term Goal #1 Start Date  -- [goal restarted]   [facility] CM Short Term Goal #1 Met Date  -- [pt saw MD 09/13/15]    [facility] CM Care Plan Problem Two        Most Recent Value   [facility] CM Short Term Goal #1 Start Date  -- [goal restarted]      PLAN Continue weekly transition of care calls     RNC, BSN [facility] Community Care Coordinator [phone]   

## 2015-12-28 ENCOUNTER — Ambulatory Visit (INDEPENDENT_AMBULATORY_CARE_PROVIDER_SITE_OTHER): Payer: Commercial Managed Care - HMO | Admitting: Internal Medicine

## 2015-12-28 ENCOUNTER — Encounter (INDEPENDENT_AMBULATORY_CARE_PROVIDER_SITE_OTHER): Payer: Self-pay | Admitting: Internal Medicine

## 2015-12-28 VITALS — BP 110/80 | HR 63 | Temp 98.3°F | Resp 18 | Ht 63.0 in | Wt 169.3 lb

## 2015-12-28 DIAGNOSIS — K7031 Alcoholic cirrhosis of liver with ascites: Secondary | ICD-10-CM | POA: Diagnosis not present

## 2015-12-28 DIAGNOSIS — K7682 Hepatic encephalopathy: Secondary | ICD-10-CM

## 2015-12-28 DIAGNOSIS — K7290 Hepatic failure, unspecified without coma: Secondary | ICD-10-CM | POA: Diagnosis not present

## 2015-12-28 DIAGNOSIS — R600 Localized edema: Secondary | ICD-10-CM

## 2015-12-28 NOTE — Progress Notes (Signed)
Presenting complaint;  Follow-up for chronic liver disease hepatic encephalopathy and LE edema.  Subjective:  Patient is 60 year old African-American female was advanced alcoholic cirrhosis and presents for scheduled visit. She was hospitalized from 11/26/2015 to 12/01/2015 for anasarca. She was given IV albumin and IV diuretics and then switch to torsemide. She has lost 10 pounds in the last 4 weeks. Lower extremity edema has decreased but not gone away completely. She was seen at [facility] on 12/08/2015. She was advised to get hepatitis B vaccination. She was also advised to proceed with rehabilitation therapy if she wanted to be considered for liver transplant. She is going to rehabilitation center. She tells me they are checking her alcohol and tox screen. She states it would cost her $58 per injection for hepatitis B vaccination. She states she does not have money to pay for this. Hepatitis B vaccination has been recommended in the past. She was sent to health department and was noted not able to get vaccination. She is watching salt intake. She has 4-5 bowel movements per day. She denies abdominal pain melena or rectal bleeding. She states she has very good appetite. She was seen by Dr. Laurence Ferrari regarding TIPS. Recent ultrasound confirmed TIPS patency. She has an appointment to see Dr. Hinda Lenis tomorrow    Current Medications: Outpatient Encounter Prescriptions as of 12/28/2015  Medication Sig  . folic acid (FOLVITE) 1 MG tablet Take 1 tablet (1 mg total) by mouth daily.  Marland Kitchen lactulose (CHRONULAC) 10 GM/15ML solution Take 22.5 mLs (15 g total) by mouth 4 (four) times daily. Four times a day  . nadolol (CORGARD) 20 MG tablet Take 10 mg by mouth daily.   . rifaximin (XIFAXAN) 550 MG TABS tablet Take 550 mg by mouth 2 (two) times daily.  Marland Kitchen spironolactone (ALDACTONE) 50 MG tablet Take 1 tablet (50 mg total) by mouth daily.  Marland Kitchen thiamine 100 MG tablet Take 1 tablet (100 mg total) by mouth  daily.  Marland Kitchen torsemide (DEMADEX) 20 MG tablet Take 3 tablets (60 mg total) by mouth daily.   No facility-administered encounter medications on file as of 12/28/2015.     Objective: Blood pressure 110/80, pulse 63, temperature 98.3 F (36.8 C), temperature source Oral, resp. rate 18, height 5\' 3"  (1.6 m), weight 169 lb 4.8 oz (76.794 kg). Patient is alert and in no acute distress. She does not have asterixis. Conjunctiva is pink. Sclera is nonicteric Oropharyngeal mucosa is normal. No neck masses or thyromegaly noted. Cardiac exam with regular rhythm normal S1 and S2. Faint systolic ejection murmur noted at left sternal border. Lungs are clear to auscultation. Abdomen is full but soft and nontender without organomegaly or masses. Shifting dullness is absent.  2+ pitting edema noted to both legs.  Labs/studies Results:  Lab data from 12/07/2015 reviewed([facility]). WBC 7.3, H&H 11.3 and 35.8 and platelet count 156K Serum sodium 140, potassium 3.7, chloride 104, CO2 28, BUN 33, creatinine 1.4 Ruben 1.8, AP 214, AST 64, ALT 34, total protein by 5.1 and albumin 2.3.  Lab data from 12/14/2015 WBC 6.6, H&H 10.7 and 31.9 and platelet count 114K BUN 38, creatinine 1.39 Bilirubin 1.2, AP 194, AST 59, ALT 19 and albumin 2.0.   Assessment:  #1. Decompensated alcoholic cirrhosis. She is up-to-date as to screen for [facility]. Hepatitis B vaccination was recommended 2 years ago but never materialized. #2. Hepatic encephalopathy. Clinically she appears to be doing well. #3. Lower extremity edema. She is responding to therapy. Renal function and she has  improved. #4. Anemia of chronic disease.   Plan:  Patient encouraged to get hepatitis B vaccination as an as she can. Patient advised to check weight every 4 days and call if she has skin more than 5 pounds. Office visit in 2 months.

## 2015-12-28 NOTE — Patient Instructions (Signed)
Please check weight every 4 days and call if you have gained more than 5 pounds.

## 2015-12-29 DIAGNOSIS — I10 Essential (primary) hypertension: Secondary | ICD-10-CM | POA: Diagnosis not present

## 2015-12-29 DIAGNOSIS — I509 Heart failure, unspecified: Secondary | ICD-10-CM | POA: Diagnosis not present

## 2015-12-29 DIAGNOSIS — N183 Chronic kidney disease, stage 3 (moderate): Secondary | ICD-10-CM | POA: Diagnosis not present

## 2015-12-30 ENCOUNTER — Other Ambulatory Visit: Payer: Self-pay | Admitting: *Deleted

## 2015-12-30 NOTE — Patient Outreach (Signed)
12/30/15- Telephone call to patient for transition of care week 4, no answer to telephone and no option to leave voicemail.  Pt did call back, HIPAA verified, Pt states she does plan to receive Hepatitis B injections and portion of the bill will be 68$. Pt reports she got good report from Dr. Bennye Alm, states " my swelling is still down, I'm doing good with that"  Pt reports she has all medications and taking as prescribed, has lactulose and verbalizes importance of not running out.  Pt verbalizes low sodium food choices and says " that's what I'm trying to do".  [facility] CM Care Plan Problem One        Most Recent Value   Care Plan Problem One  knowledge deficit related to disease process Cirrhosis   Role Documenting the Problem One  Care Management Coordinator   Care Plan for Problem One  Active   THN Long Term Goal (31-90 days)  pt will have no hospital readmissions within 45 days   THN Long Term Goal Start Date  12/06/15 [goal restarted]   Interventions for Problem One Long Term Goal  RN CM reinforced low sodium food choices, action plan for cirrhosis and importance of calling MD early for change in health status such as increased edema.   THN CM Short Term Goal #1 Start Date  -- [goal restarted]   THN CM Short Term Goal #1 Met Date  -- [pt saw MD 09/13/15]    THN CM Care Plan Problem Two        Most Recent Value   THN CM Short Term Goal #1 (0-30 days)  pt will verbalize increased endurance with activity tolerance within 30 days.   THN CM Short Term Goal #1 Start Date  12/06/15 [goal restarted]   Interventions for Short Term Goal #2   RN CM reviewed alternating activity with rest and trying to walk some each day, adding a few minutes each day.      PLAN Follow up with one more transition of care call (pt has refused home visit).  Jacqlyn Larsen [facility], [place] Coordinator [phone]

## 2016-01-05 ENCOUNTER — Encounter: Payer: Self-pay | Admitting: *Deleted

## 2016-01-05 ENCOUNTER — Other Ambulatory Visit: Payer: Self-pay | Admitting: *Deleted

## 2016-01-05 NOTE — Patient Outreach (Signed)
01/05/16- Telephone call to patient for transition of care, pt reports she has all medications and taking as prescribed, still has not started series of hepatitis B injections and reports [employer] did approve.  Pt to follow up in [place] in June regarding liver transplant, to see Dr. Rehman 02/29/16, pt reports she is taking "alcoholic classes at [facility]" as part of requirements for liver transplant.  Pt states " all my swelling is gone, I feel better".  Pt reports she still does not want a home visit and has no further goals she wants to work towards.  RN CM discharged pt today and faxed letter to primary MD Dr. Fanta informing of case closure as well as mailing letter to patient's home.  THN CM Care Plan Problem One        Most Recent Value   Care Plan Problem One  knowledge deficit related to disease process Cirrhosis   Role Documenting the Problem One  Care Management Coordinator   Care Plan for Problem One  Active   THN Long Term Goal (31-90 days)  pt will have no hospital readmissions within 45 days   THN Long Term Goal Start Date  12/06/15 [goal restarted]   THN Long Term Goal Met Date  01/05/16 [goal met]   Interventions for Problem One Long Term Goal  RN CM reviewed low sodium food choices, action plan for cirrhosis and importance of calling MD early for change in health status such as increased edema.   THN CM Short Term Goal #1 Start Date  -- [goal restarted]   THN CM Short Term Goal #1 Met Date  -- [pt saw MD 09/13/15]    THN CM Care Plan Problem Two        Most Recent Value   THN CM Short Term Goal #1 (0-30 days)  pt will verbalize increased endurance with activity tolerance within 30 days.   THN CM Short Term Goal #1 Start Date  12/06/15 [goal restarted]   THN CM Short Term Goal #1 Met Date   01/05/16   Interventions for Short Term Goal #2   RN CM reviewed importance of doing some type of activity daily such as walking.        RNC, BSN [facility]  Coordinator [phone]   

## 2016-01-10 ENCOUNTER — Other Ambulatory Visit (HOSPITAL_COMMUNITY): Payer: Self-pay | Admitting: Internal Medicine

## 2016-01-10 DIAGNOSIS — Z1231 Encounter for screening mammogram for malignant neoplasm of breast: Secondary | ICD-10-CM

## 2016-01-13 DIAGNOSIS — F1020 Alcohol dependence, uncomplicated: Secondary | ICD-10-CM | POA: Diagnosis not present

## 2016-01-17 ENCOUNTER — Ambulatory Visit (HOSPITAL_COMMUNITY)
Admission: RE | Admit: 2016-01-17 | Discharge: 2016-01-17 | Disposition: A | Discharge status: Home / Self Care | Payer: Commercial Managed Care - HMO | Source: Ambulatory Visit | Attending: Internal Medicine | Admitting: Internal Medicine

## 2016-01-17 DIAGNOSIS — R600 Localized edema: Secondary | ICD-10-CM | POA: Diagnosis not present

## 2016-01-17 DIAGNOSIS — R234 Changes in skin texture: Secondary | ICD-10-CM | POA: Diagnosis not present

## 2016-01-17 DIAGNOSIS — Z1231 Encounter for screening mammogram for malignant neoplasm of breast: Secondary | ICD-10-CM | POA: Diagnosis not present

## 2016-01-17 DIAGNOSIS — K7031 Alcoholic cirrhosis of liver with ascites: Secondary | ICD-10-CM | POA: Diagnosis not present

## 2016-02-02 ENCOUNTER — Telehealth (INDEPENDENT_AMBULATORY_CARE_PROVIDER_SITE_OTHER): Payer: Self-pay | Admitting: *Deleted

## 2016-02-02 NOTE — Telephone Encounter (Signed)
Patient called and left a message asking that I call her and that it was about her lactulose. I have attempted multiple times, it will ring one time then it disconnects.  I called her pharmacy, they say that she would need to have refills on her lactulose, as the patient is getting it filled weekly due to price.   We will continue to try and reach patient.

## 2016-02-13 ENCOUNTER — Telehealth: Payer: Self-pay | Admitting: Gastroenterology

## 2016-02-13 NOTE — Telephone Encounter (Signed)
PT'S DAUGHTER CALLED. HAVING SEVER LEG PAIN. REVIEWED LABS AND NOTES FROM 2017. RECOMMENDED 650 MG TYLENOL x1 AND IF PAIN CONTINUES, GO TO ED. DAUGHTER VOICED HER UNDERSTANDING.

## 2016-02-14 NOTE — Telephone Encounter (Signed)
noted 

## 2016-02-17 DIAGNOSIS — K7460 Unspecified cirrhosis of liver: Secondary | ICD-10-CM | POA: Diagnosis not present

## 2016-02-17 DIAGNOSIS — K7031 Alcoholic cirrhosis of liver with ascites: Secondary | ICD-10-CM | POA: Diagnosis not present

## 2016-02-17 DIAGNOSIS — F1020 Alcohol dependence, uncomplicated: Secondary | ICD-10-CM | POA: Diagnosis not present

## 2016-02-17 DIAGNOSIS — R69 Illness, unspecified: Secondary | ICD-10-CM | POA: Diagnosis not present

## 2016-02-24 DIAGNOSIS — F1020 Alcohol dependence, uncomplicated: Secondary | ICD-10-CM | POA: Diagnosis not present

## 2016-02-29 ENCOUNTER — Encounter (INDEPENDENT_AMBULATORY_CARE_PROVIDER_SITE_OTHER): Payer: Self-pay | Admitting: Internal Medicine

## 2016-02-29 ENCOUNTER — Ambulatory Visit (INDEPENDENT_AMBULATORY_CARE_PROVIDER_SITE_OTHER): Payer: Commercial Managed Care - HMO | Admitting: Internal Medicine

## 2016-02-29 VITALS — BP 118/80 | HR 68 | Temp 97.4°F | Resp 18 | Ht 63.0 in | Wt 170.5 lb

## 2016-02-29 DIAGNOSIS — K7290 Hepatic failure, unspecified without coma: Secondary | ICD-10-CM

## 2016-02-29 DIAGNOSIS — K7682 Hepatic encephalopathy: Secondary | ICD-10-CM

## 2016-02-29 DIAGNOSIS — K7031 Alcoholic cirrhosis of liver with ascites: Secondary | ICD-10-CM | POA: Diagnosis not present

## 2016-02-29 DIAGNOSIS — R252 Cramp and spasm: Secondary | ICD-10-CM

## 2016-02-29 MED ORDER — LACTULOSE 10 GM/15ML PO SOLN: 15.0000 g | Freq: Four times a day (QID) | ORAL | Status: DC

## 2016-02-29 NOTE — Patient Instructions (Signed)
Can try 1-2 teaspoonful of mustard every night. Tylenol  two 325 mg tablets mouth every night. Physician will call with results of blood tests when completed.

## 2016-02-29 NOTE — Progress Notes (Signed)
Presenting complaint;  Follow-up for chronic liver disease and lower extremity edema.  Subjective:  Patient is 60-year-old African-American female who has alcoholic cirrhosis complicated by hepatic encephalopathy and fluid overload in the form of lower extremity edema who is here for scheduled visit. She has been evaluated at [facility] for transplant evaluation. As recommended by Dr. Monica Martinez in [facility] she has been going to rehabilitation clinic and she is getting tox screen done per their protocol. She had blood work for 2 weeks ago while [facility]. It is reviewed under lab data. She hasn't had any more episodes of confusion. She is having 5 bowel movements per day. Most of her stools are loose. She denies melena or rectal bleeding and abdominal pain. She has excessive flatulence. She has good appetite. She feels lower extremity edema has not increased. She has an appointment at [facility] next month. She also has an appointment with Dr. Hinda Lenis within the next few weeks. Only complaint is one of left cramps which are worse at night. Patient states she remains busy. 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 She does not take OTC NSAIDs. She still has not received hepatitis B vaccination as recommended.   Current Medications: Outpatient Encounter Prescriptions as of 02/29/2016  Medication Sig  . acetaminophen (TYLENOL) 325 MG tablet Take 325 mg by mouth 2 (two) times daily as needed for moderate pain.  . folic acid (FOLVITE) 1 MG tablet Take 1 tablet (1 mg total) by mouth daily.  Marland Kitchen lactulose (CHRONULAC) 10 GM/15ML solution Take 22.5 mLs (15 g total) by mouth 4 (four) times daily. Four times a day  . nadolol (CORGARD) 20 MG tablet Take 10 mg by mouth daily.   . rifaximin (XIFAXAN) 550 MG TABS tablet Take 550 mg by mouth 2 (two) times daily.  Marland Kitchen spironolactone (ALDACTONE) 50 MG tablet Take 1 tablet (50 mg total) by mouth daily.  Marland Kitchen thiamine 100 MG tablet Take 1  tablet (100 mg total) by mouth daily.  Marland Kitchen torsemide (DEMADEX) 20 MG tablet Take 3 tablets (60 mg total) by mouth daily.   No facility-administered encounter medications on file as of 02/29/2016.     Objective: Blood pressure 118/80, pulse 68, temperature 97.4 F (36.3 C), temperature source Oral, resp. rate 18, height 5\' 3"  (1.6 m), weight 170 lb 8 oz (77.338 kg). Patient is alert and in no acute distress. She does not have asterixis. Conjunctiva is pink. Sclera is nonicteric Oropharyngeal mucosa is normal. No neck masses or thyromegaly noted. Cardiac exam with regular rhythm normal S1 and S2. No murmur or gallop noted. Lungs are clear to auscultation. Abdomen abdomen is full but soft with firm liver. Spleen not palpable. Shifting dullness absent.  1-2+ pitting edema noted involving both legs.  Labs/studies Results: Lab data from 02/17/2016 [facility]). Serum sodium 139, potassium 4.5, creatinine 1.59   blood test negative for ethanol and benzodiazepine.  Urine tox screen negative on 01/17/2016.  Assessment:  #1.Hepatic encephalopathy. Clinically she does not appear to be encephalopathic. She is having decide number of stools per day. Excessive flatulence is secondary to lactulose which she'll have to cope with. #2. 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 #3. Elevated serum creatinine. She is followed by Dr. Hinda Lenis. #4. Nocturnal leg cramps possibly secondary to diuretic therapy. Will do blood work to rule out hypokalemia and hypomagnesemia.   Plan:  New prescription given  for lactulose 22.5 mL 4 times a day for one month with 5 refills. Patient advised to take sit acetaminophen 650 mg by mouth daily at bedtime 2 treat/prevent leg cramps. She will try 1 to 2 teaspoonful of mustard daily to help alleviate leg cramps. Patient will go to the lab for metabolic 7  and serum magnesium.

## 2016-03-09 DIAGNOSIS — K7290 Hepatic failure, unspecified without coma: Secondary | ICD-10-CM | POA: Diagnosis not present

## 2016-03-09 DIAGNOSIS — R252 Cramp and spasm: Secondary | ICD-10-CM | POA: Diagnosis not present

## 2016-03-09 DIAGNOSIS — K7031 Alcoholic cirrhosis of liver with ascites: Secondary | ICD-10-CM | POA: Diagnosis not present

## 2016-03-09 DIAGNOSIS — F1020 Alcohol dependence, uncomplicated: Secondary | ICD-10-CM | POA: Diagnosis not present

## 2016-03-10 DIAGNOSIS — I10 Essential (primary) hypertension: Secondary | ICD-10-CM | POA: Diagnosis not present

## 2016-03-10 DIAGNOSIS — Z0100 Encounter for examination of eyes and vision without abnormal findings: Secondary | ICD-10-CM | POA: Diagnosis not present

## 2016-03-16 ENCOUNTER — Other Ambulatory Visit (HOSPITAL_COMMUNITY): Payer: Self-pay | Admitting: Interventional Radiology

## 2016-03-16 DIAGNOSIS — K7460 Unspecified cirrhosis of liver: Secondary | ICD-10-CM

## 2016-03-16 DIAGNOSIS — R188 Other ascites: Principal | ICD-10-CM

## 2016-03-23 DIAGNOSIS — F1020 Alcohol dependence, uncomplicated: Secondary | ICD-10-CM | POA: Diagnosis not present

## 2016-03-28 ENCOUNTER — Ambulatory Visit (HOSPITAL_COMMUNITY)
Admission: RE | Admit: 2016-03-28 | Discharge: 2016-03-28 | Disposition: A | Discharge status: Home / Self Care | Payer: Commercial Managed Care - HMO | Source: Ambulatory Visit | Attending: Interventional Radiology | Admitting: Interventional Radiology

## 2016-03-28 DIAGNOSIS — R188 Other ascites: Secondary | ICD-10-CM

## 2016-03-28 DIAGNOSIS — K7460 Unspecified cirrhosis of liver: Secondary | ICD-10-CM | POA: Diagnosis not present

## 2016-04-04 ENCOUNTER — Telehealth: Payer: Self-pay | Admitting: Radiology

## 2016-04-04 NOTE — Telephone Encounter (Signed)
Patient called with results of recent US reviewed by Dr. Laurence Ferrari.  [place] scan looks good.  Next [place] & follow up app't with Dr Laurence Ferrari to be scheduled for late August/early September 2017.   Zell Doucette Riki Rusk, RN 04/04/2016 9:55 AM

## 2016-04-06 DIAGNOSIS — F1020 Alcohol dependence, uncomplicated: Secondary | ICD-10-CM | POA: Diagnosis not present

## 2016-04-11 ENCOUNTER — Other Ambulatory Visit (INDEPENDENT_AMBULATORY_CARE_PROVIDER_SITE_OTHER): Payer: Self-pay | Admitting: *Deleted

## 2016-04-11 NOTE — Telephone Encounter (Signed)
Refer to telephone counter on this.

## 2016-04-12 ENCOUNTER — Telehealth (INDEPENDENT_AMBULATORY_CARE_PROVIDER_SITE_OTHER): Payer: Self-pay | Admitting: *Deleted

## 2016-04-12 NOTE — Telephone Encounter (Signed)
Patient called the office on 04/11/16. She said that her eight was 191 lbs and she was taking her fluid pills. At the time of her OC on May 2 , 2017 she weighed 170 lbs 8 oz.  Per Dr. Laural Golden the patient needs to start Zoraxyln 5 mg - take 1 by mouth every 4 th day as needed for fluid. # 8 no refills.. This was called to the [facility] in [place]. Dr.Rehman also advised that the patient , when she takes this medication to stay home.Patient was called and made aware of this.

## 2016-04-13 DIAGNOSIS — M545 Low back pain: Secondary | ICD-10-CM | POA: Diagnosis not present

## 2016-04-13 DIAGNOSIS — K7031 Alcoholic cirrhosis of liver with ascites: Secondary | ICD-10-CM | POA: Diagnosis not present

## 2016-04-18 ENCOUNTER — Emergency Department (HOSPITAL_COMMUNITY): Payer: Commercial Managed Care - HMO

## 2016-04-18 ENCOUNTER — Encounter (HOSPITAL_COMMUNITY): Payer: Self-pay | Admitting: Emergency Medicine

## 2016-04-18 ENCOUNTER — Inpatient Hospital Stay (HOSPITAL_COMMUNITY)
Admission: EM | Admit: 2016-04-18 | Discharge: 2016-04-25 | DRG: 433 | Disposition: A | Discharge status: Home / Self Care | Payer: Commercial Managed Care - HMO | Attending: Internal Medicine | Admitting: Internal Medicine

## 2016-04-18 ENCOUNTER — Telehealth (INDEPENDENT_AMBULATORY_CARE_PROVIDER_SITE_OTHER): Payer: Self-pay | Admitting: *Deleted

## 2016-04-18 DIAGNOSIS — N183 Chronic kidney disease, stage 3 (moderate): Secondary | ICD-10-CM | POA: Diagnosis not present

## 2016-04-18 DIAGNOSIS — K7290 Hepatic failure, unspecified without coma: Secondary | ICD-10-CM | POA: Diagnosis not present

## 2016-04-18 DIAGNOSIS — K7031 Alcoholic cirrhosis of liver with ascites: Secondary | ICD-10-CM | POA: Diagnosis not present

## 2016-04-18 DIAGNOSIS — Z96651 Presence of right artificial knee joint: Secondary | ICD-10-CM | POA: Diagnosis present

## 2016-04-18 DIAGNOSIS — F1020 Alcohol dependence, uncomplicated: Secondary | ICD-10-CM | POA: Diagnosis not present

## 2016-04-18 DIAGNOSIS — R05 Cough: Secondary | ICD-10-CM | POA: Insufficient documentation

## 2016-04-18 DIAGNOSIS — R601 Generalized edema: Secondary | ICD-10-CM

## 2016-04-18 DIAGNOSIS — N179 Acute kidney failure, unspecified: Secondary | ICD-10-CM | POA: Diagnosis present

## 2016-04-18 DIAGNOSIS — I129 Hypertensive chronic kidney disease with stage 1 through stage 4 chronic kidney disease, or unspecified chronic kidney disease: Secondary | ICD-10-CM | POA: Diagnosis present

## 2016-04-18 DIAGNOSIS — E876 Hypokalemia: Secondary | ICD-10-CM | POA: Diagnosis not present

## 2016-04-18 DIAGNOSIS — Z853 Personal history of malignant neoplasm of breast: Secondary | ICD-10-CM | POA: Diagnosis not present

## 2016-04-18 DIAGNOSIS — K5792 Diverticulitis of intestine, part unspecified, without perforation or abscess without bleeding: Secondary | ICD-10-CM | POA: Diagnosis not present

## 2016-04-18 DIAGNOSIS — Z79899 Other long term (current) drug therapy: Secondary | ICD-10-CM

## 2016-04-18 DIAGNOSIS — Z9842 Cataract extraction status, left eye: Secondary | ICD-10-CM | POA: Diagnosis not present

## 2016-04-18 DIAGNOSIS — D649 Anemia, unspecified: Secondary | ICD-10-CM | POA: Diagnosis not present

## 2016-04-18 DIAGNOSIS — G4730 Sleep apnea, unspecified: Secondary | ICD-10-CM | POA: Diagnosis not present

## 2016-04-18 DIAGNOSIS — R609 Edema, unspecified: Secondary | ICD-10-CM

## 2016-04-18 DIAGNOSIS — R600 Localized edema: Secondary | ICD-10-CM | POA: Diagnosis not present

## 2016-04-18 DIAGNOSIS — Z9841 Cataract extraction status, right eye: Secondary | ICD-10-CM

## 2016-04-18 DIAGNOSIS — K7030 Alcoholic cirrhosis of liver without ascites: Secondary | ICD-10-CM | POA: Diagnosis not present

## 2016-04-18 DIAGNOSIS — J811 Chronic pulmonary edema: Secondary | ICD-10-CM | POA: Diagnosis not present

## 2016-04-18 DIAGNOSIS — R188 Other ascites: Secondary | ICD-10-CM | POA: Diagnosis not present

## 2016-04-18 DIAGNOSIS — F1010 Alcohol abuse, uncomplicated: Secondary | ICD-10-CM | POA: Diagnosis not present

## 2016-04-18 DIAGNOSIS — N184 Chronic kidney disease, stage 4 (severe): Secondary | ICD-10-CM | POA: Diagnosis present

## 2016-04-18 DIAGNOSIS — R059 Cough, unspecified: Secondary | ICD-10-CM

## 2016-04-18 LAB — COMPREHENSIVE METABOLIC PANEL
ALBUMIN: 1.7 g/dL — AB (ref 3.5–5.0)
ALT: 21 U/L (ref 14–54)
AST: 66 U/L — AB (ref 15–41)
Alkaline Phosphatase: 158 U/L — ABNORMAL HIGH (ref 38–126)
Anion gap: 3 — ABNORMAL LOW (ref 5–15)
BUN: 43 mg/dL — AB (ref 6–20)
CHLORIDE: 112 mmol/L — AB (ref 101–111)
CO2: 23 mmol/L (ref 22–32)
CREATININE: 1.5 mg/dL — AB (ref 0.44–1.00)
Calcium: 7.8 mg/dL — ABNORMAL LOW (ref 8.9–10.3)
GFR calc Af Amer: 43 mL/min — ABNORMAL LOW (ref >60)
GFR calc non Af Amer: 37 mL/min — ABNORMAL LOW (ref >60)
GLUCOSE: 101 mg/dL — AB (ref 65–99)
POTASSIUM: 3.6 mmol/L (ref 3.5–5.1)
SODIUM: 138 mmol/L (ref 135–145)
Total Bilirubin: 2 mg/dL — ABNORMAL HIGH (ref 0.3–1.2)
Total Protein: 4.8 g/dL — ABNORMAL LOW (ref 6.5–8.1)

## 2016-04-18 LAB — CBC WITH DIFFERENTIAL/PLATELET
BASOS ABS: 0.2 10*3/uL — AB (ref 0.0–0.1)
BASOS PCT: 3 %
EOS PCT: 8 %
Eosinophils Absolute: 0.6 10*3/uL (ref 0.0–0.7)
HCT: 31.2 % — ABNORMAL LOW (ref 36.0–46.0)
Hemoglobin: 10.7 g/dL — ABNORMAL LOW (ref 12.0–15.0)
LYMPHS PCT: 27 %
Lymphs Abs: 1.9 10*3/uL (ref 0.7–4.0)
MCH: 28.6 pg (ref 26.0–34.0)
MCHC: 34.3 g/dL (ref 30.0–36.0)
MCV: 83.4 fL (ref 78.0–100.0)
MONO ABS: 0.9 10*3/uL (ref 0.1–1.0)
Monocytes Relative: 12 %
NEUTROS ABS: 3.5 10*3/uL (ref 1.7–7.7)
NEUTROS PCT: 50 %
PLATELETS: 129 10*3/uL — AB (ref 150–400)
RBC: 3.74 MIL/uL — AB (ref 3.87–5.11)
RDW: 14.9 % (ref 11.5–15.5)
WBC: 7 10*3/uL (ref 4.0–10.5)

## 2016-04-18 LAB — PROTIME-INR
INR: 1.38 (ref 0.00–1.49)
Prothrombin Time: 17 seconds — ABNORMAL HIGH (ref 11.6–15.2)

## 2016-04-18 LAB — BRAIN NATRIURETIC PEPTIDE: B NATRIURETIC PEPTIDE 5: 611 pg/mL — AB (ref 0.0–100.0)

## 2016-04-18 LAB — AMMONIA: AMMONIA: 38 umol/L — AB (ref 9–35)

## 2016-04-18 MED ORDER — FUROSEMIDE 10 MG/ML IJ SOLN
40.0000 mg | Freq: Once | INTRAMUSCULAR | Status: DC
  Filled 2016-04-18: qty 4

## 2016-04-18 MED ORDER — FUROSEMIDE 10 MG/ML IJ SOLN
40.0000 mg | Freq: Once | INTRAMUSCULAR | Status: AC
  Administered 2016-04-18: 40 mg via INTRAMUSCULAR

## 2016-04-18 NOTE — ED Notes (Signed)
PT c/o bilateral leg swelling, abdominal bloating and easily fatigue with weight gain of over a pound day this week with edema noted to bilateral lower legs.

## 2016-04-18 NOTE — ED Notes (Signed)
MD Rancour notified that pt wishes to stay.

## 2016-04-18 NOTE — ED Provider Notes (Signed)
CSN: [id number]     Arrival date & time 04/18/16  1824 History  By signing my name below, I, Nicole Kindred, attest that this documentation has been prepared under the direction and in the presence of Ezequiel Essex, MD.   Electronically Signed: Nicole Kindred, ED Scribe. 04/18/2016. 8:50 PM   Chief Complaint  Patient presents with  . Leg Swelling    The history is provided by the patient. No language interpreter was used.   HPI Comments: Savannah Jackson is a 60 y.o. female with PMHx of HTN, brain aneurysm, bilateral leg edema, and breast cancer who presents to the Emergency Department complaining of gradual onset, bilateral leg swelling, ongoing for one month. Pt reports associated abdominal distension, fatigue, and weight gain of about 25 pounds in the past two months. Pt also complains of back pain and abdominal pain. Pt called Dr. Laural Golden today and was told to come to the ED to be evaluated. No other associated symptoms noted. No worsening or alleviating factors noted. Pt denies emesis, nausea, diarrhea, constipation, decreased appetite, chest pain, shortness of breath, dysuria, hematuria, hx of DVT, hx of PE, or any other pertinent symptoms. Pt is prescribed fluid pills and has been compliant with her medication. Pt last had fluid drained about one year ago. Pt is not currently on blood thinners.  Past Medical History  Diagnosis Date  . Hypertension   . Brain aneurysm   . Rotator cuff syndrome of left shoulder   . DDD (degenerative disc disease), lumbar   . Chronic back pain Diverticultis  . Cirrhosis ([place])     alcoholic  . Sleep apnea   . ETOH abuse   . Bilateral leg edema   . Hepatomegaly     scope 2014  . Diverticulosis     scope 2014  . Cancer [facility])     breast cancer   Past Surgical History  Procedure Laterality Date  . Cholecystectomy    . Brain surgery    . Breast surgery    . Total knee arthroplasty      right. 2002  . Cataract extraction Bilateral     [facility]  2 or 3 years ago  . Colonoscopy N/A 04/10/2013    Procedure: COLONOSCOPY;  Surgeon: Rogene Houston, MD;  Location: [facility] ENDO SUITE;  Service: Endoscopy;  Laterality: N/A;  1030-rescheduled to 1200 Ann notified pt  . Esophagogastroduodenoscopy N/A 04/22/2015    Procedure: ESOPHAGOGASTRODUODENOSCOPY (EGD);  Surgeon: Rogene Houston, MD;  Location: [facility] ENDO SUITE;  Service: Endoscopy;  Laterality: N/A;  . Radiology with anesthesia N/A 07/16/2015    Procedure: TIPS;  Surgeon: Medication Radiologist, MD;  Location: [place];  Service: Radiology;  Laterality: N/A;   History reviewed. No pertinent family history. Social History  Substance Use Topics  . Smoking status: Never Smoker   . Smokeless tobacco: Never Used  . Alcohol Use: No     Comment: none since 03/2015   OB History    Gravida Para Term Preterm AB TAB SAB Ectopic Multiple Living            1     Review of Systems A complete 10 system review of systems was obtained and all systems are negative except as noted in the HPI and PMH.    Allergies  Latex; Tylenol; and Vicodin  Home Medications   Prior to Admission medications   Medication Sig Start Date End Date Taking? Authorizing Provider  acetaminophen (TYLENOL) 325 MG tablet Take 325 mg by  mouth 2 (two) times daily as needed for moderate pain.    Historical Provider, MD  folic acid (FOLVITE) 1 MG tablet Take 1 tablet (1 mg total) by mouth daily. 04/29/15   Rosita Fire, MD  lactulose (CHRONULAC) 10 GM/15ML solution Take 22.5 mLs (15 g total) by mouth 4 (four) times daily. Four times a day 02/29/16   Rogene Houston, MD  nadolol (CORGARD) 20 MG tablet Take 10 mg by mouth daily.  11/02/15   Historical Provider, MD  rifaximin (XIFAXAN) 550 MG TABS tablet Take 550 mg by mouth 2 (two) times daily.    Jacqulynn Cadet, MD  spironolactone (ALDACTONE) 50 MG tablet Take 1 tablet (50 mg total) by mouth daily. 12/01/15   Rosita Fire, MD  thiamine 100 MG tablet Take 1 tablet (100 mg total) by mouth  daily. 04/29/15   Rosita Fire, MD  torsemide (DEMADEX) 20 MG tablet Take 3 tablets (60 mg total) by mouth daily. 12/01/15   Rosita Fire, MD   BP 151/83 mmHg  Pulse 51  Temp(Src) 98 F (36.7 C) (Oral)  Resp 18  Ht 5\' 4"  (1.626 m)  Wt 194 lb (87.998 kg)  BMI 33.28 kg/m2  SpO2 100% Physical Exam  Constitutional: She is oriented to person, place, and time. She appears well-developed and well-nourished. No distress.  HENT:  Head: Normocephalic and atraumatic.  Mouth/Throat: Oropharynx is clear and moist. No oropharyngeal exudate.  Eyes: Conjunctivae and EOM are normal. Pupils are equal, round, and reactive to light.  Neck: Normal range of motion. Neck supple.  No meningismus.  Cardiovascular: Normal rate, regular rhythm, normal heart sounds and intact distal pulses.   No murmur heard. Pulmonary/Chest: Effort normal and breath sounds normal. No respiratory distress.  Abdominal: Soft. There is no tenderness. There is no rebound and no guarding.  No ascites seen on bedside ultrasounds. Abdomen soft, and non-tender.  Musculoskeletal: Normal range of motion. She exhibits no edema or tenderness.  3+ pitting edema to mid thighs bilaterally. Intact DP pulses.  Neurological: She is alert and oriented to person, place, and time. No cranial nerve deficit. She exhibits normal muscle tone. Coordination normal.  No ataxia on finger to nose bilaterally. No pronator drift. 5/5 strength throughout. CN 2-12 intact.Equal grip strength. Sensation intact.   Skin: Skin is warm.  Psychiatric: She has a normal mood and affect. Her behavior is normal.  Nursing note and vitals reviewed.   ED Course  Procedures (including critical care time) DIAGNOSTIC STUDIES: Oxygen Saturation is 100% on RA, normal by my interpretation.    COORDINATION OF CARE: 9:04 PM Discussed treatment plan which includes bedside US, CXR, BNP, CBC with differential, and CMP with pt at bedside and pt agreed to plan.  Labs Review Labs  Reviewed  BRAIN NATRIURETIC PEPTIDE - Abnormal; Notable for the following:    B Natriuretic Peptide 611.0 (*)    All other components within normal limits  CBC WITH DIFFERENTIAL/PLATELET - Abnormal; Notable for the following:    RBC 3.74 (*)    Hemoglobin 10.7 (*)    HCT 31.2 (*)    Platelets 129 (*)    Basophils Absolute 0.2 (*)    All other components within normal limits  COMPREHENSIVE METABOLIC PANEL - Abnormal; Notable for the following:    Chloride 112 (*)    Glucose, Bld 101 (*)    BUN 43 (*)    Creatinine, Ser 1.50 (*)    Calcium 7.8 (*)    Total Protein 4.8 (*)  Albumin 1.7 (*)    AST 66 (*)    Alkaline Phosphatase 158 (*)    Total Bilirubin 2.0 (*)    GFR calc non Af Amer 37 (*)    GFR calc Af Amer 43 (*)    Anion gap 3 (*)    All other components within normal limits  AMMONIA - Abnormal; Notable for the following:    Ammonia 38 (*)    All other components within normal limits  PROTIME-INR - Abnormal; Notable for the following:    Prothrombin Time 17.0 (*)    All other components within normal limits    Imaging Review Dg Chest 2 View  04/18/2016  CLINICAL DATA:  PT c/o bilateral leg swelling, abdominal bloating and easily fatigue with weight gain of over a pound day this week with edema noted to bilateral lower legs. EXAM: CHEST - 2 VIEW COMPARISON:  11/26/2015 FINDINGS: Mild interstitial edema or infiltrates. Heart size upper limits normal.  Tortuous thoracic aorta. Small bilateral pleural effusions. Visualized bones unremarkable.   TIPS stent noted. IMPRESSION: 1. New bilateral interstitial edema with pleural effusions and borderline cardiomegaly suggesting CHF. Electronically Signed   By: Lucrezia Europe M.D.   On: 04/18/2016 21:46   I have personally reviewed and evaluated these images and lab results as part of my medical decision-making.   EKG Interpretation None      MDM   Final diagnoses:  Anasarca  hx of cirrhosis s/p tips procedure sent by GI with  increased weight gain, leg edema, abdominal distension. Denies SOB or CP.  Chest x-ray shows interstitial edema. No hypoxia or tachypnea. No chest pain or shortness of breath.  Creatinine appears to be at baseline. BNP slightly elevated. IV Lasix given. Hemoglobin at baseline.  Plan admission for IV diuresis.  D/w Dr. Maudie Mercury.   EMERGENCY DEPARTMENT [place] ABD/AORTA EXAM Study: Limited Ultrasound of the Abdominal Aorta.  INDICATIONS:Abdominal pain Indication: Multiple views of the abdominal aorta are obtained from the diaphragmatic hiatus to the aortic bifurcation in transverse and sagittal planes with a multi- Frequency probe.  PERFORMED BY: Myself  IMAGES ARCHIVED?: Yes  FINDINGS: Free fluid absent  LIMITATIONS:  Abdominal pain  INTERPRETATION:  No ascites.  COMMENT:  No free fluid  Angiocath insertion Performed by: Ezequiel Essex  Consent: Verbal consent obtained. Risks and benefits: risks, benefits and alternatives were discussed Time out: Immediately prior to procedure a "time out" was called to verify the correct patient, procedure, equipment, support staff and site/side marked as required.  Preparation: Patient was prepped and draped in the usual sterile fashion.  Vein Location: L EJ  notUltrasound Guided  Gauge: 20  Normal blood return and flush without difficulty Patient tolerance: Patient tolerated the procedure well with no immediate complications.     Ezequiel Essex, MD 04/19/16 [phone]

## 2016-04-18 NOTE — Telephone Encounter (Signed)
Patient called and stated that the Zoraxlyn 5 mg had not worked. She has taken a total on 8 tablets and voided little. Reports that her abdomen is swollen , and painful. She can't lay down in bed for the last 2 nights. Sets up in chair holds a pillow to stomach to sleep. Lower extremities are swollen and shiny. She says that she is drinking water but not that much.  Dr.Rehman advised that the patient go to the Ed for a further evaluation. Patient states that she is going to se if she can find a way and come.  Note: patient weight was 191 lbs prior to taking Zoraxlyn. She report that her weight is 195 lbs ,now.

## 2016-04-18 NOTE — ED Notes (Signed)
Family states that pt has gained approx 5 pounds

## 2016-04-18 NOTE — ED Notes (Addendum)
Pt states she wishes to stay in the hospital at this time. Reports her husband did not want her to stay and is now gone.

## 2016-04-19 ENCOUNTER — Observation Stay (HOSPITAL_COMMUNITY): Payer: Commercial Managed Care - HMO

## 2016-04-19 ENCOUNTER — Telehealth: Payer: Self-pay | Admitting: Internal Medicine

## 2016-04-19 ENCOUNTER — Encounter (HOSPITAL_COMMUNITY): Payer: Self-pay | Admitting: Internal Medicine

## 2016-04-19 DIAGNOSIS — F1010 Alcohol abuse, uncomplicated: Secondary | ICD-10-CM | POA: Diagnosis present

## 2016-04-19 DIAGNOSIS — G4730 Sleep apnea, unspecified: Secondary | ICD-10-CM | POA: Diagnosis present

## 2016-04-19 DIAGNOSIS — N183 Chronic kidney disease, stage 3 (moderate): Secondary | ICD-10-CM | POA: Diagnosis present

## 2016-04-19 DIAGNOSIS — N179 Acute kidney failure, unspecified: Secondary | ICD-10-CM | POA: Diagnosis present

## 2016-04-19 DIAGNOSIS — D649 Anemia, unspecified: Secondary | ICD-10-CM | POA: Diagnosis present

## 2016-04-19 DIAGNOSIS — Z96651 Presence of right artificial knee joint: Secondary | ICD-10-CM | POA: Diagnosis present

## 2016-04-19 DIAGNOSIS — Z79899 Other long term (current) drug therapy: Secondary | ICD-10-CM | POA: Diagnosis not present

## 2016-04-19 DIAGNOSIS — R05 Cough: Secondary | ICD-10-CM | POA: Diagnosis not present

## 2016-04-19 DIAGNOSIS — Z9841 Cataract extraction status, right eye: Secondary | ICD-10-CM | POA: Diagnosis not present

## 2016-04-19 DIAGNOSIS — R600 Localized edema: Secondary | ICD-10-CM | POA: Diagnosis not present

## 2016-04-19 DIAGNOSIS — I129 Hypertensive chronic kidney disease with stage 1 through stage 4 chronic kidney disease, or unspecified chronic kidney disease: Secondary | ICD-10-CM | POA: Diagnosis present

## 2016-04-19 DIAGNOSIS — K7290 Hepatic failure, unspecified without coma: Secondary | ICD-10-CM | POA: Diagnosis present

## 2016-04-19 DIAGNOSIS — K5792 Diverticulitis of intestine, part unspecified, without perforation or abscess without bleeding: Secondary | ICD-10-CM | POA: Diagnosis present

## 2016-04-19 DIAGNOSIS — E876 Hypokalemia: Secondary | ICD-10-CM | POA: Diagnosis not present

## 2016-04-19 DIAGNOSIS — K7031 Alcoholic cirrhosis of liver with ascites: Secondary | ICD-10-CM | POA: Diagnosis present

## 2016-04-19 DIAGNOSIS — Z9842 Cataract extraction status, left eye: Secondary | ICD-10-CM | POA: Diagnosis not present

## 2016-04-19 DIAGNOSIS — Z853 Personal history of malignant neoplasm of breast: Secondary | ICD-10-CM | POA: Diagnosis not present

## 2016-04-19 DIAGNOSIS — R601 Generalized edema: Secondary | ICD-10-CM | POA: Diagnosis not present

## 2016-04-19 DIAGNOSIS — K7030 Alcoholic cirrhosis of liver without ascites: Secondary | ICD-10-CM | POA: Diagnosis not present

## 2016-04-19 DIAGNOSIS — R609 Edema, unspecified: Secondary | ICD-10-CM | POA: Diagnosis present

## 2016-04-19 DIAGNOSIS — R188 Other ascites: Secondary | ICD-10-CM | POA: Diagnosis not present

## 2016-04-19 LAB — COMPREHENSIVE METABOLIC PANEL
ALK PHOS: 146 U/L — AB (ref 38–126)
ALT: 19 U/L (ref 14–54)
AST: 63 U/L — AB (ref 15–41)
Albumin: 1.7 g/dL — ABNORMAL LOW (ref 3.5–5.0)
Anion gap: 6 (ref 5–15)
BILIRUBIN TOTAL: 1.9 mg/dL — AB (ref 0.3–1.2)
BUN: 43 mg/dL — AB (ref 6–20)
CALCIUM: 7.9 mg/dL — AB (ref 8.9–10.3)
CO2: 21 mmol/L — ABNORMAL LOW (ref 22–32)
CREATININE: 1.36 mg/dL — AB (ref 0.44–1.00)
Chloride: 112 mmol/L — ABNORMAL HIGH (ref 101–111)
GFR calc Af Amer: 48 mL/min — ABNORMAL LOW (ref >60)
GFR, EST NON AFRICAN AMERICAN: 41 mL/min — AB (ref >60)
Glucose, Bld: 101 mg/dL — ABNORMAL HIGH (ref 65–99)
Potassium: 3.2 mmol/L — ABNORMAL LOW (ref 3.5–5.1)
Sodium: 139 mmol/L (ref 135–145)
TOTAL PROTEIN: 4.7 g/dL — AB (ref 6.5–8.1)

## 2016-04-19 LAB — CBC
HCT: 33 % — ABNORMAL LOW (ref 36.0–46.0)
Hemoglobin: 11 g/dL — ABNORMAL LOW (ref 12.0–15.0)
MCH: 27.8 pg (ref 26.0–34.0)
MCHC: 33.3 g/dL (ref 30.0–36.0)
MCV: 83.5 fL (ref 78.0–100.0)
PLATELETS: 138 10*3/uL — AB (ref 150–400)
RBC: 3.95 MIL/uL (ref 3.87–5.11)
RDW: 15.1 % (ref 11.5–15.5)
WBC: 8.2 10*3/uL (ref 4.0–10.5)

## 2016-04-19 LAB — SODIUM, URINE, RANDOM: SODIUM UR: 82 mmol/L

## 2016-04-19 MED ORDER — OXYCODONE HCL 5 MG PO TABS
5.0000 mg | ORAL_TABLET | Freq: Four times a day (QID) | ORAL | Status: DC | PRN
  Administered 2016-04-19 – 2016-04-24 (×10): 5 mg via ORAL
  Filled 2016-04-19 (×11): qty 1

## 2016-04-19 MED ORDER — ALBUMIN HUMAN 25 % IV SOLN
50.0000 g | Freq: Every day | INTRAVENOUS | Status: AC
  Administered 2016-04-19 – 2016-04-21 (×3): 50 g via INTRAVENOUS
  Filled 2016-04-19 (×3): qty 200

## 2016-04-19 MED ORDER — POTASSIUM CHLORIDE CRYS ER 20 MEQ PO TBCR
40.0000 meq | EXTENDED_RELEASE_TABLET | Freq: Two times a day (BID) | ORAL | Status: DC
  Administered 2016-04-19 – 2016-04-21 (×5): 40 meq via ORAL
  Filled 2016-04-19 (×7): qty 2

## 2016-04-19 MED ORDER — LACTULOSE 10 GM/15ML PO SOLN: 10.0000 g | Freq: Three times a day (TID) | ORAL | Status: DC

## 2016-04-19 MED ORDER — FUROSEMIDE 10 MG/ML IJ SOLN
40.0000 mg | Freq: Every day | INTRAMUSCULAR | Status: DC
  Administered 2016-04-19: 40 mg via INTRAVENOUS
  Filled 2016-04-19 (×2): qty 4

## 2016-04-19 MED ORDER — NADOLOL 20 MG PO TABS
10.0000 mg | ORAL_TABLET | Freq: Every day | ORAL | Status: DC
  Administered 2016-04-19 – 2016-04-25 (×7): 10 mg via ORAL
  Filled 2016-04-19 (×9): qty 1

## 2016-04-19 MED ORDER — LACTULOSE 10 GM/15ML PO SOLN
10.0000 g | Freq: Three times a day (TID) | ORAL | Status: DC
  Administered 2016-04-20 – 2016-04-21 (×5): 10 g via ORAL
  Filled 2016-04-19 (×7): qty 30

## 2016-04-19 MED ORDER — RIFAXIMIN 550 MG PO TABS
550.0000 mg | ORAL_TABLET | Freq: Two times a day (BID) | ORAL | Status: DC
  Administered 2016-04-19 – 2016-04-25 (×13): 550 mg via ORAL
  Filled 2016-04-19 (×13): qty 1

## 2016-04-19 MED ORDER — VITAMIN B-1 100 MG PO TABS
100.0000 mg | ORAL_TABLET | Freq: Every day | ORAL | Status: DC
  Administered 2016-04-19 – 2016-04-25 (×7): 100 mg via ORAL
  Filled 2016-04-19 (×7): qty 1

## 2016-04-19 MED ORDER — SPIRONOLACTONE 25 MG PO TABS
50.0000 mg | ORAL_TABLET | Freq: Every day | ORAL | Status: DC
  Administered 2016-04-19 – 2016-04-21 (×3): 50 mg via ORAL
  Filled 2016-04-19 (×4): qty 2

## 2016-04-19 MED ORDER — SODIUM CHLORIDE 0.9% FLUSH
3.0000 mL | Freq: Two times a day (BID) | INTRAVENOUS | Status: DC
  Administered 2016-04-19 – 2016-04-24 (×12): 3 mL via INTRAVENOUS

## 2016-04-19 MED ORDER — FOLIC ACID 1 MG PO TABS
1.0000 mg | ORAL_TABLET | Freq: Every day | ORAL | Status: DC
  Administered 2016-04-19 – 2016-04-25 (×7): 1 mg via ORAL
  Filled 2016-04-19 (×6): qty 1

## 2016-04-19 MED ORDER — LACTULOSE 10 GM/15ML PO SOLN
15.0000 g | Freq: Four times a day (QID) | ORAL | Status: DC
  Administered 2016-04-19 (×3): 15 g via ORAL
  Filled 2016-04-19 (×4): qty 30

## 2016-04-19 MED ORDER — FUROSEMIDE 10 MG/ML IJ SOLN
40.0000 mg | Freq: Every day | INTRAMUSCULAR | Status: DC
  Administered 2016-04-19: 40 mg via INTRAVENOUS
  Filled 2016-04-19: qty 4

## 2016-04-19 NOTE — Consult Note (Signed)
Referring Provider: No ref. provider found Primary Care Physician:  Rosita Fire, MD Primary Gastroenterologist:  Dr. Laural Golden  Reason for Consultation:    Lower extremity edema in a patient with known alcoholic cirrhosis unresponsive to outpatient therapy.  HPI:   Patient is 60 year old African-American female who has decompensated alcoholic cirrhosis. He was last hospitalized for massive lower extremity edema in January this year. She has been maintained on diuretic therapy and was doing well when she was seen in the office about 6 weeks ago when she weighed 169 pounds. Patient called our office on 04/12/2016 stating that she had gained 22 pounds and fluid medication was not working. She was called in prescription for metolazone. Patient called yesterday stating that lower extremity edema had not decreased and she was also having difficulty breathing when she would lie flat. Patient was therefore advised to go to emergency room. She was evaluated and hospitalized and begun on IV furosemide. Admission she weighed 194 pounds. This morning she weighed 191 pounds and 14 ounces. She states she is on low-salt diet. She has been taking her medications as prescribed. She has not experienced nausea vomiting hematemesis melena or rectal bleeding. She states she is had at least 8 loose stools today. She complains of mild discomfort in mid and lower abdomen. She feels bloated. She says her appetite is good. She denies being confused. She is worried about her husband who has bilateral AKA and is wheelchair-bound. She states she has appointment with Dr. Monica Martinez next Monday.   Past Medical History  Diagnosis Date  . Hypertension   . Brain aneurysm   . Rotator cuff syndrome of left shoulder   . DDD (degenerative disc disease), lumbar   . Chronic back pain Diverticultis  . Cirrhosis ([place])     alcoholic  . Sleep apnea   . ETOH abuse   . Bilateral leg edema   . Hepatomegaly     scope 2014  .  Diverticulosis     scope 2014  . Cancer [facility])     breast cancer    Past Surgical History  Procedure Laterality Date  . Cholecystectomy    . Brain surgery      aneurysm at [facility]  . Breast surgery      cancer  . Total knee arthroplasty      right. 2002  . Cataract extraction Bilateral     [facility] 2 or 3 years ago  . Colonoscopy N/A 04/10/2013    Procedure: COLONOSCOPY;  Surgeon: Rogene Houston, MD;  [facility];  Service: Endoscopy;  Laterality: N/A;  1030-rescheduled to 1200 Ann notified pt  . Esophagogastroduodenoscopy N/A 04/22/2015    Procedure: ESOPHAGOGASTRODUODENOSCOPY (EGD);  Surgeon: Rogene Houston, MD;  [facility];  Service: Endoscopy;  Laterality: N/A;  . Radiology with anesthesia N/A 07/16/2015    Procedure: TIPS;  Surgeon: Medication Radiologist, MD;  Location: [place];  Service: Radiology;  Laterality: N/A;    Prior to Admission medications   Medication Sig Start Date End Date Taking? Authorizing Provider  acetaminophen (TYLENOL) 325 MG tablet Take 325 mg by mouth 2 (two) times daily as needed for moderate pain.   Yes Historical Provider, MD  folic acid (FOLVITE) 1 MG tablet Take 1 tablet (1 mg total) by mouth daily. 04/29/15  Yes Rosita Fire, MD  lactulose (CHRONULAC) 10 GM/15ML solution Take 22.5 mLs (15 g total) by mouth 4 (four) times daily. Four times a day 02/29/16  Yes Rogene Houston, MD  metolazone (ZAROXOLYN) 5 MG tablet Take 1 tablet by mouth once a week. Take every 4th day. 04/11/16  Yes Historical Provider, MD  nadolol (CORGARD) 20 MG tablet Take 10 mg by mouth daily.  11/02/15  Yes Historical Provider, MD  rifaximin (XIFAXAN) 550 MG TABS tablet Take 550 mg by mouth 2 (two) times daily.   Yes Jacqulynn Cadet, MD  spironolactone (ALDACTONE) 50 MG tablet Take 1 tablet (50 mg total) by mouth daily. 12/01/15  Yes Rosita Fire, MD  thiamine 100 MG tablet Take 1 tablet (100 mg total) by mouth daily. 04/29/15  Yes Rosita Fire, MD  torsemide  (DEMADEX) 20 MG tablet Take 3 tablets (60 mg total) by mouth daily. 12/01/15  Yes Rosita Fire, MD  traMADol (ULTRAM) 50 MG tablet Take 50 mg by mouth daily as needed for moderate pain.   Yes Historical Provider, MD    Current Facility-Administered Medications  Medication Dose Route Frequency Provider Last Rate Last Dose  . albumin human 25 % solution 50 g  50 g Intravenous Q1200 Rogene Houston, MD      . folic acid (FOLVITE) tablet 1 mg  1 mg Oral Daily Jani Gravel, MD   1 mg at 04/19/16 0926  . furosemide (LASIX) injection 40 mg  40 mg Intravenous Q1500 Rogene Houston, MD      . lactulose (CHRONULAC) 10 GM/15ML solution 15 g  15 g Oral QID Jani Gravel, MD   15 g at 04/19/16 0925  . nadolol (CORGARD) tablet 10 mg  10 mg Oral Daily Jani Gravel, MD   10 mg at 04/19/16 0926  . potassium chloride SA (K-DUR,KLOR-CON) CR tablet 40 mEq  40 mEq Oral BID Rosita Fire, MD   40 mEq at 04/19/16 0926  . rifaximin (XIFAXAN) tablet 550 mg  550 mg Oral BID Jani Gravel, MD   550 mg at 04/19/16 0927  . sodium chloride flush (NS) 0.9 % injection 3 mL  3 mL Intravenous Q12H Jani Gravel, MD   3 mL at 04/19/16 0925  . spironolactone (ALDACTONE) tablet 50 mg  50 mg Oral Daily Jani Gravel, MD   50 mg at 04/19/16 0926  . thiamine (VITAMIN B-1) tablet 100 mg  100 mg Oral Daily Jani Gravel, MD   100 mg at 04/19/16 [id number]    Allergies as of 04/18/2016 - Review Complete 04/18/2016  Allergen Reaction Noted  . Latex Swelling 07/15/2012  . Tylenol [acetaminophen] Palpitations 11/26/2015  . Vicodin [hydrocodone-acetaminophen] Itching 09/06/2012    Family History  Problem Relation Age of Onset  . Aneurysm Mother     Social History   Social History  . Marital Status: Married    Spouse Name: N/A  . Number of Children: N/A  . Years of Education: N/A   Occupational History  . Not on file.   Social History Main Topics  . Smoking status: Never Smoker   . Smokeless tobacco: Never Used  . Alcohol Use: No     Comment: none  since 03/2015  . Drug Use: No  . Sexual Activity: Yes    Birth Control/ Protection: None   Other Topics Concern  . 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 However he is fairly capable of taking care of himself at home. This couple have an attentive daughter who keeps an eye on both of them.    Review of Systems: See HPI, otherwise  normal ROS  Physical Exam: Temp:  [97.8 F (36.6 C)-98 F (36.7 C)] 97.8 F (36.6 C) (06/21 0625) Pulse Rate:  [51-62] 62 (06/21 0625) Resp:  [18-20] 18 (06/21 0625) BP: (106-151)/(61-83) 106/61 mmHg (06/21 0625) SpO2:  [99 %-100 %] 99 % (06/21 0625) Weight:  [191 lb 14.4 oz (87.045 kg)-194 lb (87.998 kg)] 191 lb 14.4 oz (87.045 kg) (06/21 0625) Last BM Date: 04/18/16 Patient is alert and does not have asterixis. Conjunctiva was pink. Sclera is nonicteric. Oropharyngeal mucosa is normal. She has dentures in place. No neck masses or thyromegaly noted. Cardiac exam with regular rhythm normal S1 and S2. No murmur or gallop noted. Breath sounds are diminished at right base. Abdomen is is full with edema to abdominal wall. Bowel sounds are normal. On palpation abdomen is soft with wake tenderness in mid abdomen and across lower abdomen without guarding or rebound. Left lobe of liver is palpable and is firm. Spleen is not palpable. She has 3+ pitting edema involving both legs. She has some pitting edema to both eyes.    Total I/O In: -  Out: 450 [Urine:450]  Lab Results:  Recent Labs  04/18/16 2113 04/19/16 0426  WBC 7.0 8.2  HGB 10.7* 11.0*  HCT 31.2* 33.0*  PLT 129* 138*   BMET  Recent Labs  04/18/16 2113 04/19/16 0426  NA 138 139  K 3.6 3.2*  CL 112* 112*  CO2 23 21*  GLUCOSE 101* 101*  BUN 43* 43*  CREATININE 1.50* 1.36*  CALCIUM 7.8* 7.9*   LFT  Recent Labs  04/19/16 0426  PROT 4.7*  ALBUMIN 1.7*  AST 63*  ALT 19  ALKPHOS 146*   BILITOT 1.9*   PT/INR  Recent Labs  04/18/16 2113  LABPROT 17.0*  INR 1.38    Studies/Results: Dg Chest 2 View  04/18/2016  CLINICAL DATA:  PT c/o bilateral leg swelling, abdominal bloating and easily fatigue with weight gain of over a pound day this week with edema noted to bilateral lower legs. EXAM: CHEST - 2 VIEW COMPARISON:  11/26/2015 FINDINGS: Mild interstitial edema or infiltrates. Heart size upper limits normal.  Tortuous thoracic aorta. Small bilateral pleural effusions. Visualized bones unremarkable.   TIPS stent noted. IMPRESSION: 1. New bilateral interstitial edema with pleural effusions and borderline cardiomegaly suggesting CHF. Electronically Signed   By: Lucrezia Europe M.D.   On: 04/18/2016 21:46   US Venous Img Lower Bilateral  04/19/2016  CLINICAL DATA:  Bilateral lower extremity edema for 2 weeks. EXAM: BILATERAL LOWER EXTREMITY VENOUS DOPPLER ULTRASOUND TECHNIQUE: Gray-scale sonography with graded compression, as well as color Doppler and duplex ultrasound were performed to evaluate the lower extremity deep venous systems from the level of the common femoral vein and including the common femoral, femoral, profunda femoral, popliteal and calf veins including the posterior tibial, peroneal and gastrocnemius veins when visible. The superficial great saphenous vein was also interrogated. Spectral Doppler was utilized to evaluate flow at rest and with distal augmentation maneuvers in the common femoral, femoral and popliteal veins. COMPARISON:  None. FINDINGS: RIGHT LOWER EXTREMITY Common Femoral Vein: No evidence of thrombus. Normal compressibility, respiratory phasicity and response to augmentation. Saphenofemoral Junction: No evidence of thrombus. Normal compressibility and flow on color Doppler imaging. Profunda Femoral Vein: No evidence of thrombus. Normal compressibility and flow on color Doppler imaging. Femoral Vein: No evidence of thrombus. Normal compressibility, respiratory  phasicity and response to augmentation. Popliteal Vein: No evidence of thrombus. Normal compressibility, respiratory phasicity and response to  augmentation. Calf Veins: No evidence of thrombus. Normal compressibility and flow on color Doppler imaging. Superficial Great Saphenous Vein: No evidence of thrombus. Normal compressibility and flow on color Doppler imaging. Venous Reflux:  None. Other Findings:  None. LEFT LOWER EXTREMITY Common Femoral Vein: No evidence of thrombus. Normal compressibility, respiratory phasicity and response to augmentation. Saphenofemoral Junction: No evidence of thrombus. Normal compressibility and flow on color Doppler imaging. Profunda Femoral Vein: No evidence of thrombus. Normal compressibility and flow on color Doppler imaging. Femoral Vein: No evidence of thrombus. Normal compressibility, respiratory phasicity and response to augmentation. Popliteal Vein: No evidence of thrombus. Normal compressibility, respiratory phasicity and response to augmentation. Calf Veins: No evidence of thrombus. Normal compressibility and flow on color Doppler imaging. Superficial Great Saphenous Vein: No evidence of thrombus. Normal compressibility and flow on color Doppler imaging. Venous Reflux:  None. Other Findings:  None. IMPRESSION: There is no evidence of deep venous thrombosis seen in either lower extremity. Electronically Signed   By: Marijo Conception, M.D.   On: 04/19/2016 10:26    Assessment;  Patient is 60 year old African-American female who has advanced alcoholic cirrhosis who is being evaluated at [facility] for liver transplant(actually has appointment to see Dr. Monica Martinez on 11/25/2015) presents with fluid overload primarily in the form of lower extremity edema. She has failed outpatient therapy and seemed to be responding to IV furosemide. Serum albumin is critically low and in this situation albumin infusion would help in order to improve oncotic pressure and hopefully will  third space fluid back into circulation. Patient's presentation is very curious as she does not have ascites or recurrence of significant right pleural effusion. Liver fluid retention appears to be in lower extremities and in the form of tissue edema since she had TIPS placed in September last year.  Weight lower abdominal pain most likely secondary to lactulose. She has had more than 8 bowel movements today. Therefore she will benefit from dose reduction.   Recommendations;  Albumin 50 g IV daily to be followed by furosemide 40 mg IV daily 3 doses. 4 g sodium diet. Random urinary sodium level. If urinary sodium is low May increase furosemide dose. Metabolic 7 in a.m. Decrease lactulose dose as she is having too many bowel movements.       Najeeb Rehman  04/19/2016, 12:29 PM

## 2016-04-19 NOTE — H&P (Signed)
[facility] H&P   Patient Demographics:    Savannah Jackson, is a 60 y.o. female  MRN: [id number]   DOB - Oct 14, 1956  Admit Date - 04/18/2016  Outpatient Primary MD for the patient is Rosita Fire, MD  Referring MD/NP/PA: Ezequiel Essex  Outpatient Specialists: Dr. Laural Golden  Patient coming from: home  Chief Complaint  Patient presents with  . Leg Swelling      HPI:    Savannah Jackson  is a 60 y.o. female, w cirrhosis,ascites,  ckd stage3, brain aneurysm (remote past), Diverticulitis, apparently going to evaluated for liver transplant by [facility], c/o worsening edema x1 month.  And today slight lower abdominal cramping, ? Diverticulitis.  Pt presented to ED for evaluation   In ED,  Pt noted to be anemic with hgb 10.7 and w renal insufficiency creatinine 1.5 Pt will be admitted for w/up of anemia.     Review of systems:    In addition to the HPI above,  No Fever-chills, No Headache, No changes with Vision or hearing, No problems swallowing food or Liquids, No Chest pain, Cough or Shortness of Breath,  No Nausea or Vommitting, Bowel movements are regular, No Blood in stool or Urine, No dysuria, No new skin rashes or bruises, No new joints pains-aches,  No new weakness, tingling, numbness in any extremity, No recent weight gain or loss, No polyuria, polydypsia or polyphagia, No significant Mental Stressors.  A full 10 point Review of Systems was done, except as stated above, all other Review of Systems were negative.   With Past History of the following :    Past Medical History  Diagnosis Date  . Hypertension   . Brain aneurysm   . Rotator cuff syndrome of left shoulder   . DDD (degenerative disc disease), lumbar   . Chronic back pain Diverticultis  . Cirrhosis ([place])     alcoholic  . Sleep apnea   . ETOH abuse   . Bilateral leg edema   . Hepatomegaly    scope 2014  . Diverticulosis     scope 2014  . Cancer [facility])     breast cancer      Past Surgical History  Procedure Laterality Date  . Cholecystectomy    . Brain surgery      [facility]ysm at [facility]  . Breast surgery      cancer  . Total knee arthroplasty      right. 2002  . Cataract extraction Bilateral     [facility] 2 or 3 years ago  . Colonoscopy N/A 04/10/2013    Procedure: COLONOSCOPY;  Surgeon: Rogene Houston, MD;  [facility];  Service: Endoscopy;  Laterality: N/A;  1030-rescheduled to 1200 Ann notified pt  . Esophagogastroduodenoscopy N/A 04/22/2015    Procedure: ESOPHAGOGASTRODUODENOSCOPY (EGD);  Surgeon: Rogene Houston, MD;  [facility];  Service: Endoscopy;  Laterality: N/A;  .  Radiology with anesthesia N/A 07/16/2015    Procedure: TIPS;  Surgeon: Medication Radiologist, MD;  Location: [place];  Service: Radiology;  Laterality: N/A;      Social History:     Social History  Substance Use Topics  . Smoking status: Never Smoker   . Smokeless tobacco: Never Used  . Alcohol Use: No     Comment: none since 03/2015     Lives - at home with husband  Mobility - ambulates by self     Family History :     Family History  Problem Relation Age of Onset  . Aneurysm Mother      Home Medications:   Prior to Admission medications   Medication Sig Start Date End Date Taking? Authorizing Provider  acetaminophen (TYLENOL) 325 MG tablet Take 325 mg by mouth 2 (two) times daily as needed for moderate pain.    Historical Provider, MD  folic acid (FOLVITE) 1 MG tablet Take 1 tablet (1 mg total) by mouth daily. 04/29/15   Rosita Fire, MD  lactulose (CHRONULAC) 10 GM/15ML solution Take 22.5 mLs (15 g total) by mouth 4 (four) times daily. Four times a day 02/29/16   Rogene Houston, MD  nadolol (CORGARD) 20 MG tablet Take 10 mg by mouth daily.  11/02/15   Historical Provider, MD  rifaximin (XIFAXAN) 550 MG TABS tablet Take 550 mg by mouth 2 (two) times daily.     Jacqulynn Cadet, MD  spironolactone (ALDACTONE) 50 MG tablet Take 1 tablet (50 mg total) by mouth daily. 12/01/15   Rosita Fire, MD  thiamine 100 MG tablet Take 1 tablet (100 mg total) by mouth daily. 04/29/15   Rosita Fire, MD  torsemide (DEMADEX) 20 MG tablet Take 3 tablets (60 mg total) by mouth daily. 12/01/15   Rosita Fire, MD     Allergies:     Allergies  Allergen Reactions  . Latex Swelling    Discoloration of skin.   . Tylenol [Acetaminophen] Palpitations    Says doctor told her not to take   . Vicodin [Hydrocodone-Acetaminophen] Itching     Physical Exam:   Vitals  Blood pressure 116/66, pulse 53, temperature 98 F (36.7 C), temperature source Oral, resp. rate 18, height 5\' 4"  (1.626 m), weight 87.998 kg (194 lb), SpO2 100 %.   1. General lying in bed in NAD,    2. Normal affect and insight, Not Suicidal or Homicidal, Awake Alert, Oriented X 3.  3. No F.N deficits, ALL C.Nerves Intact, Strength 5/5 all 4 extremities, Sensation intact all 4 extremities, Plantars down going.  4. Ears and Eyes appear Normal, Conjunctivae clear, PERRLA. Moist Oral Mucosa.  5. Supple Neck, No JVD, No cervical lymphadenopathy appriciated, No Carotid Bruits.  6. Symmetrical Chest wall movement, Good air movement bilaterally, CTAB.  7. RRR, No Gallops, Rubs. 1/6 sem apex, No Parasternal Heave.  8. Positive Bowel Sounds, Abdomen Soft, slightly distended,  No tenderness, No organomegaly appriciated,No rebound -guarding or rigidity.  9.  No Cyanosis, Normal Skin Turgor, No Skin Rash or Bruise.  10. Good muscle tone,  joints appear normal , no effusions, Normal ROM.  11. No Palpable Lymph Nodes in Neck or Axillae  No palmar erythema, no asterixis     Data Review:    CBC  Recent Labs Lab 04/18/16 2113  WBC 7.0  HGB 10.7*  HCT 31.2*  PLT 129*  MCV 83.4  MCH 28.6  MCHC 34.3  RDW 14.9  LYMPHSABS 1.9  MONOABS 0.9  EOSABS 0.6  BASOSABS 0.2*    ------------------------------------------------------------------------------------------------------------------  Chemistries   Recent Labs Lab 04/18/16 2113  NA 138  K 3.6  CL 112*  CO2 23  GLUCOSE 101*  BUN 43*  CREATININE 1.50*  CALCIUM 7.8*  AST 66*  ALT 21  ALKPHOS 158*  BILITOT 2.0*   ------------------------------------------------------------------------------------------------------------------ estimated creatinine clearance is 42.8 mL/min (by C-G formula based on Cr of 1.5). ------------------------------------------------------------------------------------------------------------------ No results for input(s): TSH, T4TOTAL, T3FREE, THYROIDAB in the last 72 hours.  Invalid input(s): FREET3  Coagulation profile  Recent Labs Lab 04/18/16 2113  INR 1.38   ------------------------------------------------------------------------------------------------------------------- No results for input(s): DDIMER in the last 72 hours. -------------------------------------------------------------------------------------------------------------------  Cardiac Enzymes No results for input(s): CKMB, TROPONINI, MYOGLOBIN in the last 168 hours.  Invalid input(s): CK ------------------------------------------------------------------------------------------------------------------    Component Value Date/Time   BNP 611.0* 04/18/2016 2113     ---------------------------------------------------------------------------------------------------------------  Urinalysis    Component Value Date/Time   COLORURINE YELLOW 10/19/2015 1656   APPEARANCEUR CLEAR 10/19/2015 1656   LABSPEC 1.010 10/19/2015 1656   PHURINE 5.5 10/19/2015 1656   GLUCOSEU NEGATIVE 10/19/2015 1656   HGBUR SMALL* 10/19/2015 1656   BILIRUBINUR NEGATIVE 10/19/2015 1656   KETONESUR NEGATIVE 10/19/2015 1656   PROTEINUR TRACE* 10/19/2015 1656   UROBILINOGEN 0.2 08/30/2015 1642   NITRITE NEGATIVE  10/19/2015 1656   LEUKOCYTESUR NEGATIVE 10/19/2015 1656    ----------------------------------------------------------------------------------------------------------------   Imaging Results:    Dg Chest 2 View  04/18/2016  CLINICAL DATA:  PT c/o bilateral leg swelling, abdominal bloating and easily fatigue with weight gain of over a pound day this week with edema noted to bilateral lower legs. EXAM: CHEST - 2 VIEW COMPARISON:  11/26/2015 FINDINGS: Mild interstitial edema or infiltrates. Heart size upper limits normal.  Tortuous thoracic aorta. Small bilateral pleural effusions. Visualized bones unremarkable.   TIPS stent noted. IMPRESSION: 1. New bilateral interstitial edema with pleural effusions and borderline cardiomegaly suggesting CHF. Electronically Signed   By: Lucrezia Europe M.D.   On: 04/18/2016 21:46       Assessment & Plan:    Active Problems:   Anemia   Stage III chronic kidney disease   Anasarca    1. Edema secondary to cirrhosis Lasix 40mg  iv qday Spironolactone 25mg  po qday  2.  Anemia Check cbc in am  3. Ckd stage3 Check cmp in am  4.  Cirrhosis Please f/u with Dr. Laural Golden and Monica Martinez. [facility])   DVT Prophylaxis SCDs   AM Labs Ordered, also please review Full Orders  Family Communication: Admission, patients condition and plan of care including tests being ordered have been discussed with the patient who indicate understanding and agree with the plan and Code Status.  Code Status FULL CODE  Likely DC to  home  Condition GUARDED    Consults called:    Admission status: observation   Time spent in minutes : telemetry   Jani Gravel M.D on 04/19/2016 at 12:31 AM  Between 7am to 7pm - Pager - [phone]. After 7pm go to [email or web address] - password [email or web address]  [facility] - Office  [phone]

## 2016-04-19 NOTE — Care Management Obs Status (Signed)
[facility] NOTIFICATION   Patient Details  Name: Savannah Jackson MRN: [id number] Date of Birth: [birth year]   Medicare Observation Status Notification Given:  Yes    Ieasha Boerema, Chauncey Reading, RN 04/19/2016, 3:14 PM

## 2016-04-19 NOTE — Progress Notes (Signed)
Subjective: This is a 60 years old female with history of multiple medical illnesses was admitted due to worsening anasarca and ascites. 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  Objective: Vital signs in last 24 hours: Temp:  [97.8 F (36.6 C)-98 F (36.7 C)] 97.8 F (36.6 C) (06/21 0625) Pulse Rate:  [51-62] 62 (06/21 0625) Resp:  [18-20] 18 (06/21 0625) BP: (106-151)/(61-83) 106/61 mmHg (06/21 0625) SpO2:  [99 %-100 %] 99 % (06/21 0625) Weight:  [87.045 kg (191 lb 14.4 oz)-87.998 kg (194 lb)] 87.045 kg (191 lb 14.4 oz) (06/21 6967) Weight change:  Last BM Date: 04/19/16  Intake/Output from previous day:    PHYSICAL EXAM General appearance: fatigued, no distress and slowed mentation Resp: diminished breath sounds bilaterally and rhonchi bilaterally Cardio: S1, S2 normal GI: moderately distended, ascites, bowel sound ++ Extremities: 4++++ leg edema  Lab Results:  Results for orders placed or performed during the hospital encounter of 04/18/16 (from the past 48 hour(s))  Brain natriuretic peptide     Status: Abnormal   Collection Time: 04/18/16  9:13 PM  Result Value Ref Range   B Natriuretic Peptide 611.0 (H) 0.0 - 100.0 pg/mL  CBC with Differential     Status: Abnormal   Collection Time: 04/18/16  9:13 PM  Result Value Ref Range   WBC 7.0 4.0 - 10.5 K/uL   RBC 3.74 (L) 3.87 - 5.11 MIL/uL   Hemoglobin 10.7 (L) 12.0 - 15.0 g/dL   HCT 31.2 (L) 36.0 - 46.0 %   MCV 83.4 78.0 - 100.0 fL   MCH 28.6 26.0 - 34.0 pg   MCHC 34.3 30.0 - 36.0 g/dL   RDW 14.9 11.5 - 15.5 %   Platelets 129 (L) 150 - 400 K/uL   Neutrophils Relative % 50 %   Neutro Abs 3.5 1.7 - 7.7 K/uL   Lymphocytes Relative 27 %   Lymphs Abs 1.9 0.7 - 4.0 K/uL   Monocytes Relative 12 %   Monocytes Absolute 0.9 0.1 - 1.0 K/uL   Eosinophils Relative 8 %   Eosinophils Absolute 0.6 0.0 - 0.7 K/uL   Basophils Relative 3 %   Basophils Absolute 0.2 (H) 0.0 - 0.1 K/uL  Comprehensive  metabolic panel     Status: Abnormal   Collection Time: 04/18/16  9:13 PM  Result Value Ref Range   Sodium 138 135 - 145 mmol/L   Potassium 3.6 3.5 - 5.1 mmol/L   Chloride 112 (H) 101 - 111 mmol/L   CO2 23 22 - 32 mmol/L   Glucose, Bld 101 (H) 65 - 99 mg/dL   BUN 43 (H) 6 - 20 mg/dL   Creatinine, Ser 1.50 (H) 0.44 - 1.00 mg/dL   Calcium 7.8 (L) 8.9 - 10.3 mg/dL   Total Protein 4.8 (L) 6.5 - 8.1 g/dL   Albumin 1.7 (L) 3.5 - 5.0 g/dL   AST 66 (H) 15 - 41 U/L   ALT 21 14 - 54 U/L   Alkaline Phosphatase 158 (H) 38 - 126 U/L   Total Bilirubin 2.0 (H) 0.3 - 1.2 mg/dL   GFR calc non Af Amer 37 (L) >60 mL/min   GFR calc Af Amer 43 (L) >60 mL/min    Comment: (NOTE) The eGFR has been calculated using the CKD EPI equation. This calculation has not been validated in all clinical situations. eGFR's persistently <60 mL/min signify possible Chronic Kidney Disease.    Anion gap 3 (L) 5 - 15  Ammonia  Status: Abnormal   Collection Time: 04/18/16  9:13 PM  Result Value Ref Range   Ammonia 38 (H) 9 - 35 umol/L  Protime-INR     Status: Abnormal   Collection Time: 04/18/16  9:13 PM  Result Value Ref Range   Prothrombin Time 17.0 (H) 11.6 - 15.2 seconds   INR 1.38 0.00 - 1.49  Comprehensive metabolic panel     Status: Abnormal   Collection Time: 04/19/16  4:26 AM  Result Value Ref Range   Sodium 139 135 - 145 mmol/L   Potassium 3.2 (L) 3.5 - 5.1 mmol/L   Chloride 112 (H) 101 - 111 mmol/L   CO2 21 (L) 22 - 32 mmol/L   Glucose, Bld 101 (H) 65 - 99 mg/dL   BUN 43 (H) 6 - 20 mg/dL   Creatinine, Ser 1.36 (H) 0.44 - 1.00 mg/dL   Calcium 7.9 (L) 8.9 - 10.3 mg/dL   Total Protein 4.7 (L) 6.5 - 8.1 g/dL   Albumin 1.7 (L) 3.5 - 5.0 g/dL   AST 63 (H) 15 - 41 U/L   ALT 19 14 - 54 U/L   Alkaline Phosphatase 146 (H) 38 - 126 U/L   Total Bilirubin 1.9 (H) 0.3 - 1.2 mg/dL   GFR calc non Af Amer 41 (L) >60 mL/min   GFR calc Af Amer 48 (L) >60 mL/min    Comment: (NOTE) The eGFR has been calculated  using the CKD EPI equation. This calculation has not been validated in all clinical situations. eGFR's persistently <60 mL/min signify possible Chronic Kidney Disease.    Anion gap 6 5 - 15  CBC     Status: Abnormal   Collection Time: 04/19/16  4:26 AM  Result Value Ref Range   WBC 8.2 4.0 - 10.5 K/uL   RBC 3.95 3.87 - 5.11 MIL/uL   Hemoglobin 11.0 (L) 12.0 - 15.0 g/dL   HCT 33.0 (L) 36.0 - 46.0 %   MCV 83.5 78.0 - 100.0 fL   MCH 27.8 26.0 - 34.0 pg   MCHC 33.3 30.0 - 36.0 g/dL   RDW 15.1 11.5 - 15.5 %   Platelets 138 (L) 150 - 400 K/uL    ABGS No results for input(s): PHART, PO2ART, TCO2, HCO3 in the last 72 hours.  Invalid input(s): PCO2 CULTURES No results found for this or any previous visit (from the past 240 hour(s)). Studies/Results: Dg Chest 2 View  04/18/2016  CLINICAL DATA:  PT c/o bilateral leg swelling, abdominal bloating and easily fatigue with weight gain of over a pound day this week with edema noted to bilateral lower legs. EXAM: CHEST - 2 VIEW COMPARISON:  11/26/2015 FINDINGS: Mild interstitial edema or infiltrates. Heart size upper limits normal.  Tortuous thoracic aorta. Small bilateral pleural effusions. Visualized bones unremarkable.   TIPS stent noted. IMPRESSION: 1. New bilateral interstitial edema with pleural effusions and borderline cardiomegaly suggesting CHF. Electronically Signed   By: Lucrezia Europe M.D.   On: 04/18/2016 21:46    Medications: I have reviewed the patient's current medications.  Assesment:   Active Problems:   Anemia   Stage III chronic kidney disease   Edema   Anasarca Alcoholic liver cirrhosis with ascites   Plan:  Medications reviewed Continue diuresis Will supplement GI consult       Savannah Jackson 04/19/2016, 8:13 AM

## 2016-04-19 NOTE — Telephone Encounter (Signed)
Patient hospitalized for management of lower extremity edema.

## 2016-04-19 NOTE — Telephone Encounter (Signed)
Patient's husband called me just after hours yesterday. Saw Dr. Laural Golden yesterday. Labs done at [facility] by his report. They reported Dr. Olevia Perches office called them with lab abnormalities for which they report they were told to come to the ER.  They stated they called the office back for clarification.  Got no answer. They called me for clarification. I attempted to sign into Epic outside the hospital the hospital to glean more information but was unable to do so because of Epic issues.. I called the patient back and told them, on face value, if they were told to come to the emergency department by Dr. Olevia Perches office they should follow-up those instructions.

## 2016-04-20 LAB — COMPREHENSIVE METABOLIC PANEL
ALT: 15 U/L (ref 14–54)
AST: 46 U/L — AB (ref 15–41)
Albumin: 2 g/dL — ABNORMAL LOW (ref 3.5–5.0)
Alkaline Phosphatase: 118 U/L (ref 38–126)
Anion gap: 4 — ABNORMAL LOW (ref 5–15)
BILIRUBIN TOTAL: 1.9 mg/dL — AB (ref 0.3–1.2)
BUN: 48 mg/dL — AB (ref 6–20)
CO2: 23 mmol/L (ref 22–32)
Calcium: 7.9 mg/dL — ABNORMAL LOW (ref 8.9–10.3)
Chloride: 113 mmol/L — ABNORMAL HIGH (ref 101–111)
Creatinine, Ser: 1.6 mg/dL — ABNORMAL HIGH (ref 0.44–1.00)
GFR, EST AFRICAN AMERICAN: 39 mL/min — AB (ref >60)
GFR, EST NON AFRICAN AMERICAN: 34 mL/min — AB (ref >60)
Glucose, Bld: 93 mg/dL (ref 65–99)
POTASSIUM: 4.1 mmol/L (ref 3.5–5.1)
Sodium: 140 mmol/L (ref 135–145)
TOTAL PROTEIN: 4.3 g/dL — AB (ref 6.5–8.1)

## 2016-04-20 MED ORDER — FUROSEMIDE 10 MG/ML IJ SOLN
40.0000 mg | Freq: Every day | INTRAMUSCULAR | Status: DC
  Administered 2016-04-20: 40 mg via INTRAVENOUS

## 2016-04-20 MED ORDER — METOLAZONE 5 MG PO TABS
5.0000 mg | ORAL_TABLET | Freq: Once | ORAL | Status: AC
  Administered 2016-04-20: 5 mg via ORAL
  Filled 2016-04-20: qty 1

## 2016-04-20 NOTE — Progress Notes (Signed)
  Subjective:  Patient states she is not passing a lot of urine. She denies abdominal pain today.   Objective: Blood pressure 112/59, pulse 75, temperature 98.4 F (36.9 C), temperature source Oral, resp. rate 20, height 5\' 4"  (1.626 m), weight 191 lb 6.4 oz (86.818 kg), SpO2 98 %. Patient is alert and in no acute distress. Asterixis absent. Conjunctiva is pink. Sclera is nonicteric Oropharyngeal mucosa is normal. No neck masses or thyromegaly noted. Cardiac exam with regular rhythm normal S1 and S2. Faint systolic ejection murmur noted at left sternal border. Lungs are clear to auscultation. Abdomen is full but soft and nontender. 2 to 3+ pitting edema involving both legs.  Labs/studies Results:   Recent Labs  04/18/16 2113 04/19/16 0426  WBC 7.0 8.2  HGB 10.7* 11.0*  HCT 31.2* 33.0*  PLT 129* 138*    BMET   Recent Labs  04/18/16 2113 04/19/16 0426 04/20/16 0516  NA 138 139 140  K 3.6 3.2* 4.1  CL 112* 112* 113*  CO2 23 21* 23  GLUCOSE 101* 101* 93  BUN 43* 43* 48*  CREATININE 1.50* 1.36* 1.60*  CALCIUM 7.8* 7.9* 7.9*    LFT   Recent Labs  04/18/16 2113 04/19/16 0426 04/20/16 0516  PROT 4.8* 4.7* 4.3*  ALBUMIN 1.7* 1.7* 2.0*  AST 66* 63* 46*  ALT 21 19 15   ALKPHOS 158* 146* 118  BILITOT 2.0* 1.9* 1.9*    PT/INR   Recent Labs  04/18/16 2113  LABPROT 17.0*  INR 1.38     Assessment:  #1. Fluid overload primarily in the form of lower extremity edema/tissue edema in patient with advanced alcoholic cirrhosis and severe hypoalbuminemia. She is receiving IV albumin followed by furosemide. without significant diuresis. She may need higher dose of furosemide. #2. Hepatic encephalopathy.   Recommendations:  Metolazone 5 mg by mouth 1 today. Repeat lab in a.m.

## 2016-04-20 NOTE — Progress Notes (Signed)
Subjective: Patient feels better. She is on Iv lasix and albumin. She is diuresing better. No new complaint.  Objective: Vital signs in last 24 hours: Temp:  [97.4 F (36.3 C)-98.4 F (36.9 C)] 98.4 F (36.9 C) (06/22 0444) Pulse Rate:  [50-75] 75 (06/22 0444) Resp:  [18-20] 20 (06/22 0444) BP: (112-143)/(59-76) 112/59 mmHg (06/22 0444) SpO2:  [98 %-100 %] 98 % (06/22 0444) Weight:  [86.818 kg (191 lb 6.4 oz)] 86.818 kg (191 lb 6.4 oz) (06/22 0444) Weight change: -1.179 kg (-2 lb 9.6 oz) Last BM Date: 04/18/16  Intake/Output from previous day: 06/21 0701 - 06/22 0700 In: 240 [P.O.:240] Out: 950 [Urine:950]  PHYSICAL EXAM General appearance: fatigued, no distress and slowed mentation Resp: diminished breath sounds bilaterally and rhonchi bilaterally Cardio: S1, S2 normal GI: moderately distended, ascites, bowel sound ++ Extremities: 4++++ leg edema  Lab Results:  Results for orders placed or performed during the hospital encounter of 04/18/16 (from the past 48 hour(s))  Brain natriuretic peptide     Status: Abnormal   Collection Time: 04/18/16  9:13 PM  Result Value Ref Range   B Natriuretic Peptide 611.0 (H) 0.0 - 100.0 pg/mL  CBC with Differential     Status: Abnormal   Collection Time: 04/18/16  9:13 PM  Result Value Ref Range   WBC 7.0 4.0 - 10.5 K/uL   RBC 3.74 (L) 3.87 - 5.11 MIL/uL   Hemoglobin 10.7 (L) 12.0 - 15.0 g/dL   HCT 31.2 (L) 36.0 - 46.0 %   MCV 83.4 78.0 - 100.0 fL   MCH 28.6 26.0 - 34.0 pg   MCHC 34.3 30.0 - 36.0 g/dL   RDW 14.9 11.5 - 15.5 %   Platelets 129 (L) 150 - 400 K/uL   Neutrophils Relative % 50 %   Neutro Abs 3.5 1.7 - 7.7 K/uL   Lymphocytes Relative 27 %   Lymphs Abs 1.9 0.7 - 4.0 K/uL   Monocytes Relative 12 %   Monocytes Absolute 0.9 0.1 - 1.0 K/uL   Eosinophils Relative 8 %   Eosinophils Absolute 0.6 0.0 - 0.7 K/uL   Basophils Relative 3 %   Basophils Absolute 0.2 (H) 0.0 - 0.1 K/uL  Comprehensive metabolic panel     Status:  Abnormal   Collection Time: 04/18/16  9:13 PM  Result Value Ref Range   Sodium 138 135 - 145 mmol/L   Potassium 3.6 3.5 - 5.1 mmol/L   Chloride 112 (H) 101 - 111 mmol/L   CO2 23 22 - 32 mmol/L   Glucose, Bld 101 (H) 65 - 99 mg/dL   BUN 43 (H) 6 - 20 mg/dL   Creatinine, Ser 1.50 (H) 0.44 - 1.00 mg/dL   Calcium 7.8 (L) 8.9 - 10.3 mg/dL   Total Protein 4.8 (L) 6.5 - 8.1 g/dL   Albumin 1.7 (L) 3.5 - 5.0 g/dL   AST 66 (H) 15 - 41 U/L   ALT 21 14 - 54 U/L   Alkaline Phosphatase 158 (H) 38 - 126 U/L   Total Bilirubin 2.0 (H) 0.3 - 1.2 mg/dL   GFR calc non Af Amer 37 (L) >60 mL/min   GFR calc Af Amer 43 (L) >60 mL/min    Comment: (NOTE) The eGFR has been calculated using the CKD EPI equation. This calculation has not been validated in all clinical situations. eGFR's persistently <60 mL/min signify possible Chronic Kidney Disease.    Anion gap 3 (L) 5 - 15  Ammonia  Status: Abnormal   Collection Time: 04/18/16  9:13 PM  Result Value Ref Range   Ammonia 38 (H) 9 - 35 umol/L  Protime-INR     Status: Abnormal   Collection Time: 04/18/16  9:13 PM  Result Value Ref Range   Prothrombin Time 17.0 (H) 11.6 - 15.2 seconds   INR 1.38 0.00 - 1.49  Comprehensive metabolic panel     Status: Abnormal   Collection Time: 04/19/16  4:26 AM  Result Value Ref Range   Sodium 139 135 - 145 mmol/L   Potassium 3.2 (L) 3.5 - 5.1 mmol/L   Chloride 112 (H) 101 - 111 mmol/L   CO2 21 (L) 22 - 32 mmol/L   Glucose, Bld 101 (H) 65 - 99 mg/dL   BUN 43 (H) 6 - 20 mg/dL   Creatinine, Ser 1.36 (H) 0.44 - 1.00 mg/dL   Calcium 7.9 (L) 8.9 - 10.3 mg/dL   Total Protein 4.7 (L) 6.5 - 8.1 g/dL   Albumin 1.7 (L) 3.5 - 5.0 g/dL   AST 63 (H) 15 - 41 U/L   ALT 19 14 - 54 U/L   Alkaline Phosphatase 146 (H) 38 - 126 U/L   Total Bilirubin 1.9 (H) 0.3 - 1.2 mg/dL   GFR calc non Af Amer 41 (L) >60 mL/min   GFR calc Af Amer 48 (L) >60 mL/min    Comment: (NOTE) The eGFR has been calculated using the CKD EPI  equation. This calculation has not been validated in all clinical situations. eGFR's persistently <60 mL/min signify possible Chronic Kidney Disease.    Anion gap 6 5 - 15  CBC     Status: Abnormal   Collection Time: 04/19/16  4:26 AM  Result Value Ref Range   WBC 8.2 4.0 - 10.5 K/uL   RBC 3.95 3.87 - 5.11 MIL/uL   Hemoglobin 11.0 (L) 12.0 - 15.0 g/dL   HCT 33.0 (L) 36.0 - 46.0 %   MCV 83.5 78.0 - 100.0 fL   MCH 27.8 26.0 - 34.0 pg   MCHC 33.3 30.0 - 36.0 g/dL   RDW 15.1 11.5 - 15.5 %   Platelets 138 (L) 150 - 400 K/uL  Sodium, urine, random     Status: None   Collection Time: 04/19/16  7:30 PM  Result Value Ref Range   Sodium, Ur 82 mmol/L  Comprehensive metabolic panel     Status: Abnormal   Collection Time: 04/20/16  5:16 AM  Result Value Ref Range   Sodium 140 135 - 145 mmol/L   Potassium 4.1 3.5 - 5.1 mmol/L    Comment: DELTA CHECK NOTED   Chloride 113 (H) 101 - 111 mmol/L   CO2 23 22 - 32 mmol/L   Glucose, Bld 93 65 - 99 mg/dL   BUN 48 (H) 6 - 20 mg/dL   Creatinine, Ser 1.60 (H) 0.44 - 1.00 mg/dL   Calcium 7.9 (L) 8.9 - 10.3 mg/dL   Total Protein 4.3 (L) 6.5 - 8.1 g/dL   Albumin 2.0 (L) 3.5 - 5.0 g/dL   AST 46 (H) 15 - 41 U/L   ALT 15 14 - 54 U/L   Alkaline Phosphatase 118 38 - 126 U/L   Total Bilirubin 1.9 (H) 0.3 - 1.2 mg/dL   GFR calc non Af Amer 34 (L) >60 mL/min   GFR calc Af Amer 39 (L) >60 mL/min    Comment: (NOTE) The eGFR has been calculated using the CKD EPI equation. This calculation has not been  validated in all clinical situations. eGFR's persistently <60 mL/min signify possible Chronic Kidney Disease.    Anion gap 4 (L) 5 - 15    ABGS No results for input(s): PHART, PO2ART, TCO2, HCO3 in the last 72 hours.  Invalid input(s): PCO2 CULTURES No results found for this or any previous visit (from the past 240 hour(s)). Studies/Results: Dg Chest 2 View  04/18/2016  CLINICAL DATA:  PT c/o bilateral leg swelling, abdominal bloating and easily  fatigue with weight gain of over a pound day this week with edema noted to bilateral lower legs. EXAM: CHEST - 2 VIEW COMPARISON:  11/26/2015 FINDINGS: Mild interstitial edema or infiltrates. Heart size upper limits normal.  Tortuous thoracic aorta. Small bilateral pleural effusions. Visualized bones unremarkable.   TIPS stent noted. IMPRESSION: 1. New bilateral interstitial edema with pleural effusions and borderline cardiomegaly suggesting CHF. Electronically Signed   By: Lucrezia Europe M.D.   On: 04/18/2016 21:46   US Venous Img Lower Bilateral  04/19/2016  CLINICAL DATA:  Bilateral lower extremity edema for 2 weeks. EXAM: BILATERAL LOWER EXTREMITY VENOUS DOPPLER ULTRASOUND TECHNIQUE: Gray-scale sonography with graded compression, as well as color Doppler and duplex ultrasound were performed to evaluate the lower extremity deep venous systems from the level of the common femoral vein and including the common femoral, femoral, profunda femoral, popliteal and calf veins including the posterior tibial, peroneal and gastrocnemius veins when visible. The superficial great saphenous vein was also interrogated. Spectral Doppler was utilized to evaluate flow at rest and with distal augmentation maneuvers in the common femoral, femoral and popliteal veins. COMPARISON:  None. FINDINGS: RIGHT LOWER EXTREMITY Common Femoral Vein: No evidence of thrombus. Normal compressibility, respiratory phasicity and response to augmentation. Saphenofemoral Junction: No evidence of thrombus. Normal compressibility and flow on color Doppler imaging. Profunda Femoral Vein: No evidence of thrombus. Normal compressibility and flow on color Doppler imaging. Femoral Vein: No evidence of thrombus. Normal compressibility, respiratory phasicity and response to augmentation. Popliteal Vein: No evidence of thrombus. Normal compressibility, respiratory phasicity and response to augmentation. Calf Veins: No evidence of thrombus. Normal compressibility  and flow on color Doppler imaging. Superficial Great Saphenous Vein: No evidence of thrombus. Normal compressibility and flow on color Doppler imaging. Venous Reflux:  None. Other Findings:  None. LEFT LOWER EXTREMITY Common Femoral Vein: No evidence of thrombus. Normal compressibility, respiratory phasicity and response to augmentation. Saphenofemoral Junction: No evidence of thrombus. Normal compressibility and flow on color Doppler imaging. Profunda Femoral Vein: No evidence of thrombus. Normal compressibility and flow on color Doppler imaging. Femoral Vein: No evidence of thrombus. Normal compressibility, respiratory phasicity and response to augmentation. Popliteal Vein: No evidence of thrombus. Normal compressibility, respiratory phasicity and response to augmentation. Calf Veins: No evidence of thrombus. Normal compressibility and flow on color Doppler imaging. Superficial Great Saphenous Vein: No evidence of thrombus. Normal compressibility and flow on color Doppler imaging. Venous Reflux:  None. Other Findings:  None. IMPRESSION: There is no evidence of deep venous thrombosis seen in either lower extremity. Electronically Signed   By: Marijo Conception, M.D.   On: 04/19/2016 10:26    Medications: I have reviewed the patient's current medications.  Assesment:   Active Problems:   Anemia   Stage III chronic kidney disease   Edema   Anasarca Alcoholic liver cirrhosis with ascites   Plan:  Medications reviewed Continue iv  Diuretic as recommended by GI Will monitor CMP GI consult appreciated     LOS: 1 day  Savannah Jackson 04/20/2016, 8:01 AM

## 2016-04-21 LAB — COMPREHENSIVE METABOLIC PANEL
ALBUMIN: 2.4 g/dL — AB (ref 3.5–5.0)
ALT: 14 U/L (ref 14–54)
AST: 44 U/L — AB (ref 15–41)
Alkaline Phosphatase: 109 U/L (ref 38–126)
Anion gap: 6 (ref 5–15)
BILIRUBIN TOTAL: 2.4 mg/dL — AB (ref 0.3–1.2)
BUN: 53 mg/dL — AB (ref 6–20)
CHLORIDE: 110 mmol/L (ref 101–111)
CO2: 22 mmol/L (ref 22–32)
CREATININE: 1.79 mg/dL — AB (ref 0.44–1.00)
Calcium: 7.9 mg/dL — ABNORMAL LOW (ref 8.9–10.3)
GFR calc Af Amer: 34 mL/min — ABNORMAL LOW (ref >60)
GFR, EST NON AFRICAN AMERICAN: 30 mL/min — AB (ref >60)
GLUCOSE: 81 mg/dL (ref 65–99)
POTASSIUM: 5.1 mmol/L (ref 3.5–5.1)
Sodium: 138 mmol/L (ref 135–145)
Total Protein: 4.4 g/dL — ABNORMAL LOW (ref 6.5–8.1)

## 2016-04-21 MED ORDER — ALBUMIN HUMAN 25 % IV SOLN
50.0000 g | Freq: Once | INTRAVENOUS | Status: AC
  Administered 2016-04-22: 50 g via INTRAVENOUS
  Filled 2016-04-21: qty 200

## 2016-04-21 MED ORDER — FUROSEMIDE 10 MG/ML IJ SOLN
60.0000 mg | Freq: Every day | INTRAMUSCULAR | Status: DC
  Administered 2016-04-21: 60 mg via INTRAVENOUS
  Filled 2016-04-21: qty 6

## 2016-04-21 MED ORDER — FUROSEMIDE 10 MG/ML IJ SOLN
60.0000 mg | Freq: Once | INTRAMUSCULAR | Status: AC
  Administered 2016-04-21: 60 mg via INTRAVENOUS
  Filled 2016-04-21: qty 6

## 2016-04-21 MED ORDER — LACTULOSE 10 GM/15ML PO SOLN
15.0000 g | Freq: Three times a day (TID) | ORAL | Status: DC
  Administered 2016-04-21 – 2016-04-25 (×11): 15 g via ORAL
  Filled 2016-04-21 (×11): qty 30

## 2016-04-21 MED ORDER — IPRATROPIUM-ALBUTEROL 0.5-2.5 (3) MG/3ML IN SOLN
3.0000 mL | RESPIRATORY_TRACT | Status: DC | PRN
Start: 2016-04-21 — End: 2016-04-25
  Administered 2016-04-21: 3 mL via RESPIRATORY_TRACT
  Filled 2016-04-21: qty 3

## 2016-04-21 MED ORDER — IPRATROPIUM-ALBUTEROL 0.5-2.5 (3) MG/3ML IN SOLN: 3.0000 mL | RESPIRATORY_TRACT | Status: DC

## 2016-04-21 MED ORDER — ONDANSETRON HCL 4 MG/2ML IJ SOLN
4.0000 mg | Freq: Four times a day (QID) | INTRAMUSCULAR | Status: DC | PRN
Start: 2016-04-21 — End: 2016-04-25
  Administered 2016-04-23: 4 mg via INTRAVENOUS
  Filled 2016-04-21: qty 2

## 2016-04-21 NOTE — Progress Notes (Signed)
  Subjective:  Patient states stool frequency has decreased and she needs to go back on her usual dose of lactulose. She denies abdominal pain. She says lower extremity edema is not going down. She is anxious to go home but knows she is not ready. She denies abdominal pain chest pain shortness of breath nausea or vomiting.  Objective: Blood pressure 152/89, pulse 60, temperature 98 F (36.7 C), temperature source Oral, resp. rate 18, height 5\' 4"  (1.626 m), weight 193 lb 8 oz (87.771 kg), SpO2 100 %. Patient is alert and in no acute distress. Asterixis absent. Lower extremity edema is about 3+.  Labs/studies Results:   Recent Labs  04/18/16 2113 04/19/16 0426  WBC 7.0 8.2  HGB 10.7* 11.0*  HCT 31.2* 33.0*  PLT 129* 138*    BMET   Recent Labs  04/19/16 0426 04/20/16 0516 04/21/16 0522  NA 139 140 138  K 3.2* 4.1 5.1  CL 112* 113* 110  CO2 21* 23 22  GLUCOSE 101* 93 81  BUN 43* 48* 53*  CREATININE 1.36* 1.60* 1.79*  CALCIUM 7.9* 7.9* 7.9*    LFT   Recent Labs  04/19/16 0426 04/20/16 0516 04/21/16 0522  PROT 4.7* 4.3* 4.4*  ALBUMIN 1.7* 2.0* 2.4*  AST 63* 46* 44*  ALT 19 15 14   ALKPHOS 146* 118 109  BILITOT 1.9* 1.9* 2.4*    PT/INR   Recent Labs  04/18/16 2113  LABPROT 17.0*  INR 1.38     Assessment:  #1. Fluid overload primarily in lower extremities. Patient so far not responding to diuretic therapy and IV albumin. Her fluid overload was primarily in the form of ascites and right pleural effusion until she had TIPS last year. Serum creatinine is creeping up will have to be monitored closely along with serum potassium while on IV diuretic therapy. #2. Decompensated alcoholic cirrhosis. She is being evaluated for liver transplant at [facility] and has an appointment to see Dr. Monica Martinez on 04/24/2016. #3. Hepatic encephalopathy. Stool frequency has decreased. Will become the dose back up to her baseline. #4. Chronic anemia. H&H is  stable..   Recommendations:  Restrict by mouth fluid to 1500 mL per 24 hours. Will give another 50 g of albumin IV tomorrow morning. If she does not respond to higher dose of furosemide you may consider furosemide infusion or nephrology consultation.  Dr. Oneida Alar will be seeing patient over the weekend.

## 2016-04-21 NOTE — Progress Notes (Signed)
Patient ID: Savannah Jackson, female   DOB: [birth year], 60 y.o.   MRN: [id number] Continues to have edema to lower extremities. Weight yesterday 191. Today her weight 103. Abdomen is soft. BS +. Alert oriented. Appetite comes and goes.  Last BM was last night.  CBC    Component Value Date/Time   WBC 8.2 04/19/2016 0426   RBC 3.95 04/19/2016 0426   HGB 11.0* 04/19/2016 0426   HCT 33.0* 04/19/2016 0426   PLT 138* 04/19/2016 0426   MCV 83.5 04/19/2016 0426   MCH 27.8 04/19/2016 0426   MCHC 33.3 04/19/2016 0426   RDW 15.1 04/19/2016 0426   LYMPHSABS 1.9 04/18/2016 2113   MONOABS 0.9 04/18/2016 2113   EOSABS 0.6 04/18/2016 2113   BASOSABS 0.2* 04/18/2016 2113    CMP Latest Ref Rng 04/21/2016 04/20/2016 04/19/2016  Glucose 65 - 99 mg/dL 81 93 101(H)  BUN 6 - 20 mg/dL 53(H) 48(H) 43(H)  Creatinine 0.44 - 1.00 mg/dL 1.79(H) 1.60(H) 1.36(H)  Sodium 135 - 145 mmol/L 138 140 139  Potassium 3.5 - 5.1 mmol/L 5.1 4.1 3.2(L)  Chloride 101 - 111 mmol/L 110 113(H) 112(H)  CO2 22 - 32 mmol/L 22 23 21(L)  Calcium 8.9 - 10.3 mg/dL 7.9(L) 7.9(L) 7.9(L)  Total Protein 6.5 - 8.1 g/dL 4.4(L) 4.3(L) 4.7(L)  Total Bilirubin 0.3 - 1.2 mg/dL 2.4(H) 1.9(H) 1.9(H)  Alkaline Phos 38 - 126 U/L 109 118 146(H)  AST 15 - 41 U/L 44(H) 46(H) 63(H)  ALT 14 - 54 U/L 14 15 19       I/O last 3 completed shifts: In: 51 [P.O.:680] Out: 1150 [Urine:1150] Total I/O In: 240 [P.O.:240] Out: -  Fluid Overload, hx of alcoholic cirrhosis.  Output 1150 yesterday.  Will discuss with Dr. Laural Golden.

## 2016-04-21 NOTE — Care Management Important Message (Signed)
Important Message  Patient Details  Name: AMEYAH PEYER MRN: [id number] Date of Birth: [birth year]   Medicare Important Message Given:  Yes    Shalea Tomczak, Chauncey Reading, RN 04/21/2016, 11:08 AM

## 2016-04-21 NOTE — Progress Notes (Signed)
Subjective: Patient is resting. Metolazone has ben added to her lasix. No significant change in urine diuesis. Her leg edema has persisted.  Objective: Vital signs in last 24 hours: Temp:  [97.5 F (36.4 C)-98.6 F (37 C)] 98.6 F (37 C) (06/23 0513) Pulse Rate:  [63-77] 77 (06/23 0513) Resp:  [18-20] 18 (06/23 0513) BP: (121-148)/(66-78) 121/66 mmHg (06/23 0513) SpO2:  [97 %-100 %] 97 % (06/23 0513) Weight:  [87.771 kg (193 lb 8 oz)] 87.771 kg (193 lb 8 oz) (06/23 0513) Weight change: 0.953 kg (2 lb 1.6 oz) Last BM Date: 04/20/16  Intake/Output from previous day: 06/22 0701 - 06/23 0700 In: 680 [P.O.:680] Out: 1150 [Urine:1150]  PHYSICAL EXAM General appearance: fatigued, no distress and slowed mentation Resp: diminished breath sounds bilaterally and rhonchi bilaterally Cardio: S1, S2 normal GI: moderately distended, ascites, bowel sound ++ Extremities: 4++++ leg edema  Lab Results:  Results for orders placed or performed during the hospital encounter of 04/18/16 (from the past 48 hour(s))  Sodium, urine, random     Status: None   Collection Time: 04/19/16  7:30 PM  Result Value Ref Range   Sodium, Ur 82 mmol/L  Comprehensive metabolic panel     Status: Abnormal   Collection Time: 04/20/16  5:16 AM  Result Value Ref Range   Sodium 140 135 - 145 mmol/L   Potassium 4.1 3.5 - 5.1 mmol/L    Comment: DELTA CHECK NOTED   Chloride 113 (H) 101 - 111 mmol/L   CO2 23 22 - 32 mmol/L   Glucose, Bld 93 65 - 99 mg/dL   BUN 48 (H) 6 - 20 mg/dL   Creatinine, Ser 1.60 (H) 0.44 - 1.00 mg/dL   Calcium 7.9 (L) 8.9 - 10.3 mg/dL   Total Protein 4.3 (L) 6.5 - 8.1 g/dL   Albumin 2.0 (L) 3.5 - 5.0 g/dL   AST 46 (H) 15 - 41 U/L   ALT 15 14 - 54 U/L   Alkaline Phosphatase 118 38 - 126 U/L   Total Bilirubin 1.9 (H) 0.3 - 1.2 mg/dL   GFR calc non Af Amer 34 (L) >60 mL/min   GFR calc Af Amer 39 (L) >60 mL/min    Comment: (NOTE) The eGFR has been calculated using the CKD EPI  equation. This calculation has not been validated in all clinical situations. eGFR's persistently <60 mL/min signify possible Chronic Kidney Disease.    Anion gap 4 (L) 5 - 15  Comprehensive metabolic panel     Status: Abnormal   Collection Time: 04/21/16  5:22 AM  Result Value Ref Range   Sodium 138 135 - 145 mmol/L   Potassium 5.1 3.5 - 5.1 mmol/L    Comment: DELTA CHECK NOTED   Chloride 110 101 - 111 mmol/L   CO2 22 22 - 32 mmol/L   Glucose, Bld 81 65 - 99 mg/dL   BUN 53 (H) 6 - 20 mg/dL   Creatinine, Ser 1.79 (H) 0.44 - 1.00 mg/dL   Calcium 7.9 (L) 8.9 - 10.3 mg/dL   Total Protein 4.4 (L) 6.5 - 8.1 g/dL   Albumin 2.4 (L) 3.5 - 5.0 g/dL   AST 44 (H) 15 - 41 U/L   ALT 14 14 - 54 U/L   Alkaline Phosphatase 109 38 - 126 U/L   Total Bilirubin 2.4 (H) 0.3 - 1.2 mg/dL   GFR calc non Af Amer 30 (L) >60 mL/min   GFR calc Af Amer 34 (L) >60 mL/min  Comment: (NOTE) The eGFR has been calculated using the CKD EPI equation. This calculation has not been validated in all clinical situations. eGFR's persistently <60 mL/min signify possible Chronic Kidney Disease.    Anion gap 6 5 - 15    ABGS No results for input(s): PHART, PO2ART, TCO2, HCO3 in the last 72 hours.  Invalid input(s): PCO2 CULTURES No results found for this or any previous visit (from the past 240 hour(s)). Studies/Results: US Venous Img Lower Bilateral  04/19/2016  CLINICAL DATA:  Bilateral lower extremity edema for 2 weeks. EXAM: BILATERAL LOWER EXTREMITY VENOUS DOPPLER ULTRASOUND TECHNIQUE: Gray-scale sonography with graded compression, as well as color Doppler and duplex ultrasound were performed to evaluate the lower extremity deep venous systems from the level of the common femoral vein and including the common femoral, femoral, profunda femoral, popliteal and calf veins including the posterior tibial, peroneal and gastrocnemius veins when visible. The superficial great saphenous vein was also interrogated.  Spectral Doppler was utilized to evaluate flow at rest and with distal augmentation maneuvers in the common femoral, femoral and popliteal veins. COMPARISON:  None. FINDINGS: RIGHT LOWER EXTREMITY Common Femoral Vein: No evidence of thrombus. Normal compressibility, respiratory phasicity and response to augmentation. Saphenofemoral Junction: No evidence of thrombus. Normal compressibility and flow on color Doppler imaging. Profunda Femoral Vein: No evidence of thrombus. Normal compressibility and flow on color Doppler imaging. Femoral Vein: No evidence of thrombus. Normal compressibility, respiratory phasicity and response to augmentation. Popliteal Vein: No evidence of thrombus. Normal compressibility, respiratory phasicity and response to augmentation. Calf Veins: No evidence of thrombus. Normal compressibility and flow on color Doppler imaging. Superficial Great Saphenous Vein: No evidence of thrombus. Normal compressibility and flow on color Doppler imaging. Venous Reflux:  None. Other Findings:  None. LEFT LOWER EXTREMITY Common Femoral Vein: No evidence of thrombus. Normal compressibility, respiratory phasicity and response to augmentation. Saphenofemoral Junction: No evidence of thrombus. Normal compressibility and flow on color Doppler imaging. Profunda Femoral Vein: No evidence of thrombus. Normal compressibility and flow on color Doppler imaging. Femoral Vein: No evidence of thrombus. Normal compressibility, respiratory phasicity and response to augmentation. Popliteal Vein: No evidence of thrombus. Normal compressibility, respiratory phasicity and response to augmentation. Calf Veins: No evidence of thrombus. Normal compressibility and flow on color Doppler imaging. Superficial Great Saphenous Vein: No evidence of thrombus. Normal compressibility and flow on color Doppler imaging. Venous Reflux:  None. Other Findings:  None. IMPRESSION: There is no evidence of deep venous thrombosis seen in either lower  extremity. Electronically Signed   By: Marijo Conception, M.D.   On: 04/19/2016 10:26    Medications: I have reviewed the patient's current medications.  Assesment:   Active Problems:   Anemia   Stage III chronic kidney disease   Edema   Anasarca Alcoholic liver cirrhosis with ascites   Plan:  Medications reviewed Continue combination diuretics Will increase the dose of lasix to 60 mg  Will monitor her renal function.     LOS: 2 days   Savannah Jackson 04/21/2016, 8:02 AM

## 2016-04-21 NOTE — Progress Notes (Signed)
On arrival to room. Patient found with expiratory wheezing and c/o sob. Vitals taken BP 152/80 hr 80 o2sat @ 99 % on RA. Dr Luan Pulling paged and new orders given

## 2016-04-21 NOTE — Care Management Note (Signed)
Case Management Note  Patient Details  Name: NAELLE KESLING MRN: [id number] Date of Birth: [birth year]   Expected Discharge Date:       04/21/2016           Expected Discharge Plan:  Home/Self Care  In-House Referral:  NA  Discharge planning Services  CM Consult  Post Acute Care Choice:  NA Choice offered to:  NA  DME Arranged:    DME Agency:     HH Arranged:    [place] Agency:     Status of Service:  Completed, signed off  If discussed at [employer] of Stay Meetings, dates discussed:    Additional Comments: No CM needs.   Sherida Dobkins, Chauncey Reading, RN 04/21/2016, 3:03 PM

## 2016-04-22 ENCOUNTER — Inpatient Hospital Stay (HOSPITAL_COMMUNITY): Payer: Commercial Managed Care - HMO

## 2016-04-22 DIAGNOSIS — R601 Generalized edema: Secondary | ICD-10-CM

## 2016-04-22 LAB — COMPREHENSIVE METABOLIC PANEL
ALT: 12 U/L — AB (ref 14–54)
AST: 43 U/L — AB (ref 15–41)
Albumin: 3.5 g/dL (ref 3.5–5.0)
Alkaline Phosphatase: 88 U/L (ref 38–126)
Anion gap: 5 (ref 5–15)
BUN: 52 mg/dL — ABNORMAL HIGH (ref 6–20)
CALCIUM: 8.6 mg/dL — AB (ref 8.9–10.3)
CO2: 22 mmol/L (ref 22–32)
CREATININE: 1.73 mg/dL — AB (ref 0.44–1.00)
Chloride: 110 mmol/L (ref 101–111)
GFR, EST AFRICAN AMERICAN: 36 mL/min — AB (ref >60)
GFR, EST NON AFRICAN AMERICAN: 31 mL/min — AB (ref >60)
Glucose, Bld: 98 mg/dL (ref 65–99)
Potassium: 5.5 mmol/L — ABNORMAL HIGH (ref 3.5–5.1)
Sodium: 137 mmol/L (ref 135–145)
Total Bilirubin: 4 mg/dL — ABNORMAL HIGH (ref 0.3–1.2)
Total Protein: 5.4 g/dL — ABNORMAL LOW (ref 6.5–8.1)

## 2016-04-22 MED ORDER — SPIRONOLACTONE 25 MG PO TABS
25.0000 mg | ORAL_TABLET | Freq: Every day | ORAL | Status: DC
  Administered 2016-04-24 – 2016-04-25 (×2): 25 mg via ORAL
  Filled 2016-04-22 (×2): qty 1

## 2016-04-22 MED ORDER — IPRATROPIUM-ALBUTEROL 0.5-2.5 (3) MG/3ML IN SOLN
3.0000 mL | Freq: Four times a day (QID) | RESPIRATORY_TRACT | Status: DC
  Administered 2016-04-22 – 2016-04-24 (×8): 3 mL via RESPIRATORY_TRACT
  Filled 2016-04-22 (×7): qty 3

## 2016-04-22 MED ORDER — FUROSEMIDE 10 MG/ML IJ SOLN: 80.0000 mg | Freq: Every day | INTRAMUSCULAR | Status: DC

## 2016-04-22 MED ORDER — SPIRONOLACTONE 25 MG PO TABS: 25.0000 mg | ORAL_TABLET | Freq: Every day | ORAL | Status: DC

## 2016-04-22 MED ORDER — LEVOFLOXACIN 250 MG PO TABS
250.0000 mg | ORAL_TABLET | Freq: Every day | ORAL | Status: DC
  Administered 2016-04-22 – 2016-04-23 (×2): 250 mg via ORAL
  Filled 2016-04-22 (×2): qty 1

## 2016-04-22 NOTE — Progress Notes (Signed)
Patient ID: Savannah Jackson, female   DOB: April 18, 1956, 59 y.o.   MRN: [id number]   Assessment/Plan: ADMITTED WITH FLUID OVERLOAD AND NOW WITH SOB AND INFILTRATES ON CXR IN SPITE OF AGGRESSIVE DIURESAND and acute renal failure. ELEVATED Cr most likely due to intravascular volume depletion. K now > 5.5 and pt Rx KCL BID/ALDACTONE/LASX. 50G ALBUMIN TODAY.  PLAN: 1. D/C KCL. HOLD LASIX AND ALDACTONE. REASSESS Cr, K, AND NEED FOR DIURESIS DAILY. 2. RESTRICT Na IN HER DIET.  3. LEVAQUIN 250 MG DAILY 4. NEBS ATC 5. CONTINUE TO MONITOR SYMPTOMS: VOMITING/BMs   Subjective: Since last evaluated the patient has developed new infiltrates on chest X-ray in spite of aggressive diuresis. Now SOB: MODERATE. DRY COUGH STARTED YESTERDAY. VOMITEDx1 YESTERDAY. BMs: 2 TODAY SO FAR ON LACTULOSE. FLUID IN LEGS ARE BETTER. THEY WERE SOFT AND NOW THEY ARE HARD. NO MORE BACK OR BELLY PAIN. SLIGHT HA THIS AM. ONLY GETTING OXYCODONE PRN FOR PAIN. FEELS FULL IN HER CHEST  AND UPPER ABDOMEN. PT DENIES FEVER, CHILLS, HEMATOCHEZIA, HEMATEMESIS, nausea, vomiting, melena, diarrhea, CHEST PAIN, CHANGE IN BOWEL IN HABITS, constipation, abdominal pain, problems swallowing, OR heartburn or indigestion.   Objective: Vital signs in last 24 hours: Filed Vitals:   04/21/16 2304 04/22/16 0521  BP: 152/78 149/76  Pulse: 79 82  Temp: 98.6 F (37 C) 99 F (37.2 C)  Resp: 20 20     General appearance: alert and mild distress, STRUGGLES TO COMPLETE FULL SENTENCES Resp: clear to auscultation bilaterally IN ANTERIOR LUNG Savannah Jackson DECREASED BS IN BASES BILATERALLY Cardio: regular rate and rhythm GI: soft, non-tender; bowel sounds normal; no masses,  no organomegaly Extremities: edema 3-4+ BIL LOWER EXTREMITIES  Lab Results:  Cr 1.73, T BILI 4.0 ALB 3.5 Hb 11   Studies/Results: Dg Chest Port 1 View  04/22/2016  CLINICAL DATA:  Cough EXAM: PORTABLE CHEST 1 VIEW COMPARISON:  04/18/2016 FINDINGS: Cardiomediastinal silhouette is  stable. There is worsening in aeration. Central mild vascular congestion. There is hazy airspace opacification in right upper lobe. Consolidation is noted in right lower lobe. Small right pleural effusion. Findings highly suspicious for asymmetric infiltrates or edema. IMPRESSION: There is worsening in aeration. Central mild vascular congestion. There is hazy airspace opacification in right upper lobe. Consolidation is noted in right lower lobe. Small right pleural effusion. Findings highly suspicious for asymmetric infiltrates or edema. Electronically Signed   By: Lahoma Crocker M.D.   On: 04/22/2016 11:39    Medications: I have reviewed the patient's current medications.   LOS: 5 days   Barney Drain 04/09/2014, 2:23 PM

## 2016-04-22 NOTE — Progress Notes (Signed)
Subjective: Patient is complaining of cough and congestion, She was given a dose of neb. No fever or chills. She is diuresing slightly better..  Objective: Vital signs in last 24 hours: Temp:  [97.7 F (36.5 C)-99 F (37.2 C)] 99 F (37.2 C) (06/24 0521) Pulse Rate:  [58-82] 82 (06/24 0521) Resp:  [16-20] 20 (06/24 0521) BP: (139-152)/(76-89) 149/76 mmHg (06/24 0521) SpO2:  [90 %-100 %] 90 % (06/24 0521) Weight:  [87.272 kg (192 lb 6.4 oz)] 87.272 kg (192 lb 6.4 oz) (06/24 0521) Weight change: -0.499 kg (-1 lb 1.6 oz) Last BM Date: 04/21/16  Intake/Output from previous day: 06/23 0701 - 06/24 0700 In: 1080 [P.O.:480; IV Piggyback:600] Out: 2590 [Urine:2590]  PHYSICAL EXAM General appearance: fatigued, no distress and slowed mentation Resp: diminished breath sounds bilaterally and rhonchi bilaterally Cardio: S1, S2 normal GI: moderately distended, ascites, bowel sound ++ Extremities: 4++++ leg edema  Lab Results:  Results for orders placed or performed during the hospital encounter of 04/18/16 (from the past 48 hour(s))  Comprehensive metabolic panel     Status: Abnormal   Collection Time: 04/21/16  5:22 AM  Result Value Ref Range   Sodium 138 135 - 145 mmol/L   Potassium 5.1 3.5 - 5.1 mmol/L    Comment: DELTA CHECK NOTED   Chloride 110 101 - 111 mmol/L   CO2 22 22 - 32 mmol/L   Glucose, Bld 81 65 - 99 mg/dL   BUN 53 (H) 6 - 20 mg/dL   Creatinine, Ser 1.79 (H) 0.44 - 1.00 mg/dL   Calcium 7.9 (L) 8.9 - 10.3 mg/dL   Total Protein 4.4 (L) 6.5 - 8.1 g/dL   Albumin 2.4 (L) 3.5 - 5.0 g/dL   AST 44 (H) 15 - 41 U/L   ALT 14 14 - 54 U/L   Alkaline Phosphatase 109 38 - 126 U/L   Total Bilirubin 2.4 (H) 0.3 - 1.2 mg/dL   GFR calc non Af Amer 30 (L) >60 mL/min   GFR calc Af Amer 34 (L) >60 mL/min    Comment: (NOTE) The eGFR has been calculated using the CKD EPI equation. This calculation has not been validated in all clinical situations. eGFR's persistently <60 mL/min  signify possible Chronic Kidney Disease.    Anion gap 6 5 - 15  Comprehensive metabolic panel     Status: Abnormal   Collection Time: 04/22/16  5:59 AM  Result Value Ref Range   Sodium 137 135 - 145 mmol/L   Potassium 5.5 (H) 3.5 - 5.1 mmol/L    Comment: NO VISIBLE HEMOLYSIS   Chloride 110 101 - 111 mmol/L   CO2 22 22 - 32 mmol/L   Glucose, Bld 98 65 - 99 mg/dL   BUN 52 (H) 6 - 20 mg/dL   Creatinine, Ser 1.73 (H) 0.44 - 1.00 mg/dL   Calcium 8.6 (L) 8.9 - 10.3 mg/dL   Total Protein 5.4 (L) 6.5 - 8.1 g/dL   Albumin 3.5 3.5 - 5.0 g/dL   AST 43 (H) 15 - 41 U/L   ALT 12 (L) 14 - 54 U/L   Alkaline Phosphatase 88 38 - 126 U/L   Total Bilirubin 4.0 (H) 0.3 - 1.2 mg/dL   GFR calc non Af Amer 31 (L) >60 mL/min   GFR calc Af Amer 36 (L) >60 mL/min    Comment: (NOTE) The eGFR has been calculated using the CKD EPI equation. This calculation has not been validated in all clinical situations. eGFR's persistently <60 mL/min  signify possible Chronic Kidney Disease.    Anion gap 5 5 - 15    ABGS No results for input(s): PHART, PO2ART, TCO2, HCO3 in the last 72 hours.  Invalid input(s): PCO2 CULTURES No results found for this or any previous visit (from the past 240 hour(s)). Studies/Results: No results found.  Medications: I have reviewed the patient's current medications.  Assesment:   Active Problems:   Anemia   Stage III chronic kidney disease   Edema   Anasarca Alcoholic liver cirrhosis with ascites   Plan:  Medications reviewed Continue combination diuretics Will increase the dose of lasix to 80 mg  Will do chest x-ray Will monitor her renal function.     LOS: 3 days   Ovie Cornelio 04/22/2016, 8:37 AM

## 2016-04-22 NOTE — Progress Notes (Signed)
0822 K+ 5.5 this AM lab. Dr.Hawkins (on call) notified and the following new orders given: 1.) Hold K+ 72mEq PO this AM 2.) Hold Aldactone 50mg  PO dose this AM 3.) BMET in the AM

## 2016-04-23 LAB — BASIC METABOLIC PANEL
Anion gap: 4 — ABNORMAL LOW (ref 5–15)
BUN: 53 mg/dL — AB (ref 6–20)
CO2: 22 mmol/L (ref 22–32)
Calcium: 8 mg/dL — ABNORMAL LOW (ref 8.9–10.3)
Chloride: 111 mmol/L (ref 101–111)
Creatinine, Ser: 1.96 mg/dL — ABNORMAL HIGH (ref 0.44–1.00)
GFR calc Af Amer: 31 mL/min — ABNORMAL LOW (ref >60)
GFR, EST NON AFRICAN AMERICAN: 27 mL/min — AB (ref >60)
GLUCOSE: 97 mg/dL (ref 65–99)
POTASSIUM: 5.1 mmol/L (ref 3.5–5.1)
Sodium: 137 mmol/L (ref 135–145)

## 2016-04-23 MED ORDER — LEVOFLOXACIN 500 MG PO TABS
500.0000 mg | ORAL_TABLET | ORAL | Status: DC
  Administered 2016-04-24 – 2016-04-25 (×2): 500 mg via ORAL
  Filled 2016-04-23 (×2): qty 1

## 2016-04-23 NOTE — Progress Notes (Signed)
Subjective: Patient is resting. Her cough and congestion is better today. Her lasix is on hold according to GI recommendation. No change in her renal function. Continue to have persistent leg edema. Chest x-ray showed congestive changes VS infiltrate. Patient is started on oral levaquin. Objective: Vital signs in last 24 hours: Temp:  [97.5 F (36.4 C)-98.7 F (37.1 C)] 97.5 F (36.4 C) (06/25 0553) Pulse Rate:  [71-74] 71 (06/25 0553) Resp:  [20] 20 (06/25 0553) BP: (129-153)/(74-83) 129/80 mmHg (06/25 0553) SpO2:  [90 %-99 %] 99 % (06/25 0553) Weight:  [86.682 kg (191 lb 1.6 oz)] 86.682 kg (191 lb 1.6 oz) (06/25 0553) Weight change: -0.59 kg (-1 lb 4.8 oz) Last BM Date: 04/22/16  Intake/Output from previous day: 06/24 0701 - 06/25 0700 In: 600 [P.O.:600] Out: 900 [Urine:900]  PHYSICAL EXAM General appearance: fatigued, no distress and slowed mentation Resp: diminished breath sounds bilaterally and rhonchi bilaterally Cardio: S1, S2 normal GI: moderately distended, ascites, bowel sound ++ Extremities: 4++++ leg edema  Lab Results:  Results for orders placed or performed during the hospital encounter of 04/18/16 (from the past 48 hour(s))  Comprehensive metabolic panel     Status: Abnormal   Collection Time: 04/22/16  5:59 AM  Result Value Ref Range   Sodium 137 135 - 145 mmol/L   Potassium 5.5 (H) 3.5 - 5.1 mmol/L    Comment: NO VISIBLE HEMOLYSIS   Chloride 110 101 - 111 mmol/L   CO2 22 22 - 32 mmol/L   Glucose, Bld 98 65 - 99 mg/dL   BUN 52 (H) 6 - 20 mg/dL   Creatinine, Ser 1.73 (H) 0.44 - 1.00 mg/dL   Calcium 8.6 (L) 8.9 - 10.3 mg/dL   Total Protein 5.4 (L) 6.5 - 8.1 g/dL   Albumin 3.5 3.5 - 5.0 g/dL   AST 43 (H) 15 - 41 U/L   ALT 12 (L) 14 - 54 U/L   Alkaline Phosphatase 88 38 - 126 U/L   Total Bilirubin 4.0 (H) 0.3 - 1.2 mg/dL   GFR calc non Af Amer 31 (L) >60 mL/min   GFR calc Af Amer 36 (L) >60 mL/min    Comment: (NOTE) The eGFR has been calculated using  the CKD EPI equation. This calculation has not been validated in all clinical situations. eGFR's persistently <60 mL/min signify possible Chronic Kidney Disease.    Anion gap 5 5 - 15  Basic metabolic panel     Status: Abnormal   Collection Time: 04/23/16  5:52 AM  Result Value Ref Range   Sodium 137 135 - 145 mmol/L   Potassium 5.1 3.5 - 5.1 mmol/L   Chloride 111 101 - 111 mmol/L   CO2 22 22 - 32 mmol/L   Glucose, Bld 97 65 - 99 mg/dL   BUN 53 (H) 6 - 20 mg/dL   Creatinine, Ser 1.96 (H) 0.44 - 1.00 mg/dL   Calcium 8.0 (L) 8.9 - 10.3 mg/dL   GFR calc non Af Amer 27 (L) >60 mL/min   GFR calc Af Amer 31 (L) >60 mL/min    Comment: (NOTE) The eGFR has been calculated using the CKD EPI equation. This calculation has not been validated in all clinical situations. eGFR's persistently <60 mL/min signify possible Chronic Kidney Disease.    Anion gap 4 (L) 5 - 15    ABGS No results for input(s): PHART, PO2ART, TCO2, HCO3 in the last 72 hours.  Invalid input(s): PCO2 CULTURES No results found for this or any  previous visit (from the past 240 hour(s)). Studies/Results: Dg Chest Port 1 View  04/22/2016  CLINICAL DATA:  Cough EXAM: PORTABLE CHEST 1 VIEW COMPARISON:  04/18/2016 FINDINGS: Cardiomediastinal silhouette is stable. There is worsening in aeration. Central mild vascular congestion. There is hazy airspace opacification in right upper lobe. Consolidation is noted in right lower lobe. Small right pleural effusion. Findings highly suspicious for asymmetric infiltrates or edema. IMPRESSION: There is worsening in aeration. Central mild vascular congestion. There is hazy airspace opacification in right upper lobe. Consolidation is noted in right lower lobe. Small right pleural effusion. Findings highly suspicious for asymmetric infiltrates or edema. Electronically Signed   By: Lahoma Crocker M.D.   On: 04/22/2016 11:39    Medications: I have reviewed the patient's current  medications.  Assesment:   Active Problems:   Anemia   Stage III chronic kidney disease   Edema   Anasarca Alcoholic liver cirrhosis with ascites   Plan:  Medications reviewed Continue neb Current treatment as per GI recommendation.    LOS: 4 days   Treyce Spillers 04/23/2016, 7:53 AM

## 2016-04-23 NOTE — Progress Notes (Signed)
Patient ID: Savannah Jackson, female   DOB: 03-15-1956, 60 y.o.   MRN: [id number]   Assessment/Plan: ADMITTED WITH ANASARCA. Cr TRENDING UP TODAY AT 1.96. K CORRECTED WITH HOLDING ALDACTONE AND D/C KCL PO. CLINICALLY IMPROVED BUT STILL REQUIRING OXYGEN.  PLAN: 1. DISCUSSED WITH PHARMACY. CHANGE LEVAQUIN TO 500 MG Q8 HRS. 2. D/C FLUID RESTRICTION. 3. CONTINUE Na RESTRICTION 4. CONTINUE XIFAXAN.    Subjective: Since I last evaluated the patient HER SOB IS IMPROVED. SHE OCCASIONALLY HAS NAUSEA. MEDS HELP. THINKS SWELLING IN LEGS IS BETTER.  Objective: Vital signs in last 24 hours: Filed Vitals:   04/22/16 2203 04/23/16 0553  BP: 145/83 129/80  Pulse: 74 71  Temp: 98.7 F (37.1 C) 97.5 F (36.4 C)  Resp: 20 20   General appearance: alert, cooperative and no distress, NASAL CANULA IN STATE Resp: clear to auscultation bilaterally Cardio: regular rate and rhythm GI: soft, non-tender; bowel sounds normal;  EXTREMITIES: 3-4+ EDEMA BIL LEs  Lab Results:  K 5.1 Cr 1.96   Studies/Results: No results found.  Medications: I have reviewed the patient's current medications.   LOS: 5 days   Barney Drain 04/09/2014, 2:23 PM

## 2016-04-24 DIAGNOSIS — K7030 Alcoholic cirrhosis of liver without ascites: Secondary | ICD-10-CM

## 2016-04-24 DIAGNOSIS — R05 Cough: Secondary | ICD-10-CM

## 2016-04-24 DIAGNOSIS — R609 Edema, unspecified: Secondary | ICD-10-CM

## 2016-04-24 DIAGNOSIS — R059 Cough, unspecified: Secondary | ICD-10-CM | POA: Insufficient documentation

## 2016-04-24 LAB — COMPREHENSIVE METABOLIC PANEL
ALBUMIN: 2.6 g/dL — AB (ref 3.5–5.0)
ALT: 13 U/L — ABNORMAL LOW (ref 14–54)
ANION GAP: 4 — AB (ref 5–15)
AST: 44 U/L — AB (ref 15–41)
Alkaline Phosphatase: 89 U/L (ref 38–126)
BILIRUBIN TOTAL: 3.4 mg/dL — AB (ref 0.3–1.2)
BUN: 54 mg/dL — AB (ref 6–20)
CHLORIDE: 106 mmol/L (ref 101–111)
CO2: 23 mmol/L (ref 22–32)
Calcium: 7.8 mg/dL — ABNORMAL LOW (ref 8.9–10.3)
Creatinine, Ser: 1.95 mg/dL — ABNORMAL HIGH (ref 0.44–1.00)
GFR calc Af Amer: 31 mL/min — ABNORMAL LOW (ref >60)
GFR calc non Af Amer: 27 mL/min — ABNORMAL LOW (ref >60)
GLUCOSE: 95 mg/dL (ref 65–99)
POTASSIUM: 4.9 mmol/L (ref 3.5–5.1)
Sodium: 133 mmol/L — ABNORMAL LOW (ref 135–145)
TOTAL PROTEIN: 4.7 g/dL — AB (ref 6.5–8.1)

## 2016-04-24 MED ORDER — IPRATROPIUM-ALBUTEROL 0.5-2.5 (3) MG/3ML IN SOLN
3.0000 mL | Freq: Three times a day (TID) | RESPIRATORY_TRACT | Status: DC
  Administered 2016-04-24 – 2016-04-25 (×4): 3 mL via RESPIRATORY_TRACT
  Filled 2016-04-24 (×5): qty 3

## 2016-04-24 NOTE — Progress Notes (Signed)
Subjective: States she states cough has resolved. Nausea much better. Denies abdominal pain. Tolerating diet well. Feels like the swelling in her legs is much improved. Is wanting to go home. She wishes she could make her appointment with transplant in [facility] today. No other upper or lower GI complaints this morning. She's asked me to call the transplant office to notify them she'll miss her appointment due to hospitalization.  Objective: Vital signs in last 24 hours: Temp:  [98.3 F (36.8 C)-98.8 F (37.1 C)] 98.5 F (36.9 C) (06/26 0551) Pulse Rate:  [63-96] 66 (06/26 0551) Resp:  [18-20] 20 (06/26 0551) BP: (122-146)/(68-77) 122/68 mmHg (06/26 0551) SpO2:  [90 %-99 %] 99 % (06/26 0801) Weight:  [194 lb 1.6 oz (88.043 kg)] 194 lb 1.6 oz (88.043 kg) (06/26 0551) Last BM Date: 04/22/16 General:   Alert and oriented, pleasant Head:  Normocephalic and atraumatic. Eyes:  No icterus, sclera clear. Conjuctiva pink.  Heart:  S1, S2 present, no murmurs noted.  Lungs: Rhonchi noted in bilateral bases, no other adventitious sounds.  Abdomen:  Bowel sounds present, soft, non-tender, non-distended. No rebound or guarding.  Msk:  Symmetrical without gross deformities. Pulses:  Normal bilateral DP pulses noted. Extremities:  Noted 2-3+ bilateral LE pitting edema. Neurologic:  Alert and  oriented x4;  grossly normal neurologically. Skin:  Warm and dry, intact without significant lesions.  Psych:  Alert and cooperative. Normal mood and affect.  Intake/Output from previous day: 06/25 0701 - 06/26 0700 In: 720 [P.O.:720] Out: 1000 [Urine:1000] Intake/Output this shift:    Lab Results: No results for input(s): WBC, HGB, HCT, PLT in the last 72 hours. BMET  Recent Labs  04/22/16 0559 04/23/16 0552  NA 137 137  K 5.5* 5.1  CL 110 111  CO2 22 22  GLUCOSE 98 97  BUN 52* 53*  CREATININE 1.73* 1.96*  CALCIUM 8.6* 8.0*   LFT  Recent Labs  04/22/16 0559  PROT 5.4*  ALBUMIN  3.5  AST 43*  ALT 12*  ALKPHOS 88  BILITOT 4.0*   PT/INR No results for input(s): LABPROT, INR in the last 72 hours. Hepatitis Panel No results for input(s): HEPBSAG, HCVAB, HEPAIGM, HEPBIGM in the last 72 hours.   Studies/Results: Dg Chest Port 1 View  04/22/2016  CLINICAL DATA:  Cough EXAM: PORTABLE CHEST 1 VIEW COMPARISON:  04/18/2016 FINDINGS: Cardiomediastinal silhouette is stable. There is worsening in aeration. Central mild vascular congestion. There is hazy airspace opacification in right upper lobe. Consolidation is noted in right lower lobe. Small right pleural effusion. Findings highly suspicious for asymmetric infiltrates or edema. IMPRESSION: There is worsening in aeration. Central mild vascular congestion. There is hazy airspace opacification in right upper lobe. Consolidation is noted in right lower lobe. Small right pleural effusion. Findings highly suspicious for asymmetric infiltrates or edema. Electronically Signed   By: Lahoma Crocker M.D.   On: 04/22/2016 11:39    Assessment: Patient is 60 year old African-American female who has advanced alcoholic cirrhosis who is being evaluated at [facility] for liver transplant(actually has appointment to see Dr. Monica Martinez on 11/25/2015) presents with fluid overload primarily in the form of lower extremity edema. She has failed outpatient therapy and seemed to be responding to IV furosemide. Serum albumin is critically low and in this situation albumin infusion would help in order to improve oncotic pressure and hopefully will third space fluid back into circulation. Patient's presentation is very curious as she does not have ascites or recurrence  of significant right pleural effusion. Liver fluid retention appears to be in lower extremities and in the form of tissue edema since she had TIPS placed in September last year.  She was admitted and started on low sodium diet, aggressive diuresis, and IV albumin to increased oncotic pressure  and reduce third-spacing. She developed hypokalemia and was placed on potassium supplementation. Over the weekend she developed shortness of breath and infiltrates, hyperkalemia, elevated Cr and KCl was d/c, lasix and aldactone held, and Levaquin started. Cr yesterday 1.96, Kcl corrected. Fluid restriction d/c, continued Xifaxan.  CMP is drawn this morning and currently in process. Today she feels her edema is improved. Coughing and nausea improved as well. Is anxious about missing her transplant appointment this morning, I have called and notified [facility] as she requested. Breathing better. Continued rhonchi bilateral bases, though sound minimal. Continued edema noted on exam. Labs pending.   ADDENDUM: CMP back and Cr remains elevated but stable. K+ remains normal. AST/ALT stable, bili improved at 3.4 (from 4.0 two days ago).   Plan: 1. CMP today to assess electrolytes and Cr (in process) 2. She is to call [facility] transplant services upon discharge to re-schedule her appointment. 3. Continued antibiotics 4. Continue to hold diuresis today for elevated Cr. 5. Continue supportive measures    Walden Field, AGNP-C Adult & Gerontological Nurse Practitioner [facility]    LOS: 5 days    04/24/2016, 8:41 AM

## 2016-04-24 NOTE — Progress Notes (Signed)
Subjective: Patient feels better. Her congestion and cough is better. Her leg edema hs persisted. No fever or chills.. Objective: Vital signs in last 24 hours: Temp:  [98.3 F (36.8 C)-98.8 F (37.1 C)] 98.5 F (36.9 C) (06/26 0551) Pulse Rate:  [63-96] 66 (06/26 0551) Resp:  [18-20] 20 (06/26 0551) BP: (122-146)/(68-77) 122/68 mmHg (06/26 0551) SpO2:  [89 %-98 %] 98 % (06/26 0551) Weight:  [88.043 kg (194 lb 1.6 oz)] 88.043 kg (194 lb 1.6 oz) (06/26 0551) Weight change: 1.361 kg (3 lb) Last BM Date: 04/22/16  Intake/Output from previous day: 06/25 0701 - 06/26 0700 In: 720 [P.O.:720] Out: 1000 [Urine:1000]  PHYSICAL EXAM General appearance: fatigued, no distress and slowed mentation Resp: diminished breath sounds bilaterally and rhonchi bilaterally Cardio: S1, S2 normal GI: moderately distended, ascites, bowel sound ++ Extremities: 4++++ leg edema  Lab Results:  Results for orders placed or performed during the hospital encounter of 04/18/16 (from the past 48 hour(s))  Basic metabolic panel     Status: Abnormal   Collection Time: 04/23/16  5:52 AM  Result Value Ref Range   Sodium 137 135 - 145 mmol/L   Potassium 5.1 3.5 - 5.1 mmol/L   Chloride 111 101 - 111 mmol/L   CO2 22 22 - 32 mmol/L   Glucose, Bld 97 65 - 99 mg/dL   BUN 53 (H) 6 - 20 mg/dL   Creatinine, Ser 1.96 (H) 0.44 - 1.00 mg/dL   Calcium 8.0 (L) 8.9 - 10.3 mg/dL   GFR calc non Af Amer 27 (L) >60 mL/min   GFR calc Af Amer 31 (L) >60 mL/min    Comment: (NOTE) The eGFR has been calculated using the CKD EPI equation. This calculation has not been validated in all clinical situations. eGFR's persistently <60 mL/min signify possible Chronic Kidney Disease.    Anion gap 4 (L) 5 - 15    ABGS No results for input(s): PHART, PO2ART, TCO2, HCO3 in the last 72 hours.  Invalid input(s): PCO2 CULTURES No results found for this or any previous visit (from the past 240 hour(s)). Studies/Results: Dg Chest Port 1  View  04/22/2016  CLINICAL DATA:  Cough EXAM: PORTABLE CHEST 1 VIEW COMPARISON:  04/18/2016 FINDINGS: Cardiomediastinal silhouette is stable. There is worsening in aeration. Central mild vascular congestion. There is hazy airspace opacification in right upper lobe. Consolidation is noted in right lower lobe. Small right pleural effusion. Findings highly suspicious for asymmetric infiltrates or edema. IMPRESSION: There is worsening in aeration. Central mild vascular congestion. There is hazy airspace opacification in right upper lobe. Consolidation is noted in right lower lobe. Small right pleural effusion. Findings highly suspicious for asymmetric infiltrates or edema. Electronically Signed   By: Lahoma Crocker M.D.   On: 04/22/2016 11:39    Medications: I have reviewed the patient's current medications.  Assesment:   Active Problems:   Anemia   Stage III chronic kidney disease   Edema   Anasarca Alcoholic liver cirrhosis with ascites   Plan:  Medications reviewed Continue neb Current treatment as per GI recommendation.    LOS: 5 days   Shakedra Beam 04/24/2016, 7:52 AM

## 2016-04-24 NOTE — Care Management Important Message (Signed)
Important Message  Patient Details  Name: Savannah Jackson MRN: [id number] Date of Birth: 11-29-1955   Medicare Important Message Given:  Yes    Sherald Barge, RN 04/24/2016, 9:16 AM

## 2016-04-24 NOTE — Progress Notes (Signed)
Pt's O2 sat 96% on O2 at 2 liters. O2 removed and pt ambulated down hallway to Nurses station and down back hallway. O2 sat dropped to 82% without O2 while walking. O2 placed at 2 liters with O2 sat returning to 96%.

## 2016-04-25 ENCOUNTER — Telehealth: Payer: Self-pay | Admitting: Gastroenterology

## 2016-04-25 DIAGNOSIS — K7031 Alcoholic cirrhosis of liver with ascites: Secondary | ICD-10-CM

## 2016-04-25 DIAGNOSIS — K7290 Hepatic failure, unspecified without coma: Secondary | ICD-10-CM

## 2016-04-25 DIAGNOSIS — K7682 Hepatic encephalopathy: Secondary | ICD-10-CM

## 2016-04-25 LAB — BASIC METABOLIC PANEL
ANION GAP: 3 — AB (ref 5–15)
BUN: 57 mg/dL — ABNORMAL HIGH (ref 6–20)
CALCIUM: 7.3 mg/dL — AB (ref 8.9–10.3)
CHLORIDE: 105 mmol/L (ref 101–111)
CO2: 23 mmol/L (ref 22–32)
Creatinine, Ser: 2.06 mg/dL — ABNORMAL HIGH (ref 0.44–1.00)
GFR calc non Af Amer: 25 mL/min — ABNORMAL LOW (ref >60)
GFR, EST AFRICAN AMERICAN: 29 mL/min — AB (ref >60)
GLUCOSE: 93 mg/dL (ref 65–99)
Potassium: 4.7 mmol/L (ref 3.5–5.1)
Sodium: 131 mmol/L — ABNORMAL LOW (ref 135–145)

## 2016-04-25 MED ORDER — LEVOFLOXACIN 500 MG PO TABS: 500.0000 mg | ORAL_TABLET | ORAL | Status: DC

## 2016-04-25 MED ORDER — SPIRONOLACTONE 25 MG PO TABS: 25.0000 mg | ORAL_TABLET | Freq: Every day | ORAL | Status: DC

## 2016-04-25 NOTE — Progress Notes (Signed)
REVIEWED-NO ADDITIONAL RECOMMENDATIONS.    Subjective: Denies abdominal pain, nausea, vomiting. States her swelling has improved. Denies any shortness of breath.   Objective: Vital signs in last 24 hours: Temp:  [97.8 F (36.6 C)-99.6 F (37.6 C)] 98.8 F (37.1 C) (06/27 0534) Pulse Rate:  [65-68] 68 (06/27 0534) Resp:  [20] 20 (06/27 0534) BP: (120-143)/(76-83) 120/83 mmHg (06/27 0534) SpO2:  [82 %-100 %] 100 % (06/27 0753) Weight:  [194 lb 7.5 oz (88.21 kg)] 194 lb 7.5 oz (88.21 kg) (06/27 0534) Last BM Date: 04/24/16 General:   Alert and oriented, pleasant Head:  Normocephalic and atraumatic. Eyes:  No icterus, sclera clear. Conjuctiva pink.  Abdomen:  Bowel sounds present, soft, non-tender, non-distended. Extremities:  2+ pitting edema bilateral lower extremities  Neurologic:  Alert and  oriented x4;  grossly normal neurologically. Psych:  Alert and cooperative. Normal mood and affect.  Intake/Output from previous day: 06/26 0701 - 06/27 0700 In: 480 [P.O.:480] Out: 1525 [Urine:1525] Intake/Output this shift:   BMET  Recent Labs  04/23/16 0552 04/24/16 0819 04/25/16 0459  NA 137 133* 131*  K 5.1 4.9 4.7  CL 111 106 105  CO2 22 23 23   GLUCOSE 97 95 93  BUN 53* 54* 57*  CREATININE 1.96* 1.95* 2.06*  CALCIUM 8.0* 7.8* 7.3*   LFT  Recent Labs  04/24/16 0819  PROT 4.7*  ALBUMIN 2.6*  AST 44*  ALT 13*  ALKPHOS 90  BILITOT 3.4*    Assessment/Plan: 60 year old African-American female with advanced alcoholic cirrhosis who is being evaluated at [facility] for liver transplant presenting with anasarca, failing outpatient therapy. Creatinine bumped up in setting of aggressive diuresis, and chest xray showed infiltrates despite aggressive diuresis with Levaquin started 6/24. She had a drop in O2 saturation yesterday with extended ambulation but quickly recovered with supplemental 2 liters O2 via nasal cannula. Clinically, she appears to look much improved from  admission and has been discharged home per attending physician. Discussed with RN, Audrea Muscat, who will have the nurse tech walk patient again today and determine need for home oxygen prn. She will need close outpatient follow-up with Dr. Laural Golden, which we will arrange in the next week. Due to bump in Cr this morning, recommend repeat CMP later this week to ensure this is remaining stable. Will arrange.  Orvil Feil, ANP-BC [facility]       LOS: 6 days    04/25/2016, 8:07 AM

## 2016-04-25 NOTE — Discharge Summary (Signed)
Physician Discharge Summary  Patient ID: Savannah Jackson MRN: [id number] DOB/AGE: Nov 16, 1955 60 y.o. Primary Care Physician:Naziyah Tieszen, MD Admit date: 04/18/2016 Discharge date: 04/25/2016    Discharge Diagnoses:   Active Problems:   Anemia   Stage III chronic kidney disease   Edema   Anasarca   Cough acute on chronic renal failure    Medication List    STOP taking these medications        metolazone 5 MG tablet  Commonly known as:  ZAROXOLYN     torsemide 20 MG tablet  Commonly known as:  DEMADEX     traMADol 50 MG tablet  Commonly known as:  ULTRAM      TAKE these medications        acetaminophen 325 MG tablet  Commonly known as:  TYLENOL  Take 325 mg by mouth 2 (two) times daily as needed for moderate pain.     folic acid 1 MG tablet  Commonly known as:  FOLVITE  Take 1 tablet (1 mg total) by mouth daily.     lactulose 10 GM/15ML solution  Commonly known as:  CHRONULAC  Take 22.5 mLs (15 g total) by mouth 4 (four) times daily. Four times a day     levofloxacin 500 MG tablet  Commonly known as:  LEVAQUIN  Take 1 tablet (500 mg total) by mouth every other day.     nadolol 20 MG tablet  Commonly known as:  CORGARD  Take 10 mg by mouth daily.     rifaximin 550 MG Tabs tablet  Commonly known as:  XIFAXAN  Take 550 mg by mouth 2 (two) times daily.     spironolactone 25 MG tablet  Commonly known as:  ALDACTONE  Take 1 tablet (25 mg total) by mouth daily.     thiamine 100 MG tablet  Take 1 tablet (100 mg total) by mouth daily.        Discharged Condition:  improved    Consults: GI  Significant Diagnostic Studies: Dg Chest 2 View  04/18/2016  CLINICAL DATA:  PT c/o bilateral leg swelling, abdominal bloating and easily fatigue with weight gain of over a pound day this week with edema noted to bilateral lower legs. EXAM: CHEST - 2 VIEW COMPARISON:  11/26/2015 FINDINGS: Mild interstitial edema or infiltrates. Heart size upper limits normal.   Tortuous thoracic aorta. Small bilateral pleural effusions. Visualized bones unremarkable.   TIPS stent noted. IMPRESSION: 1. New bilateral interstitial edema with pleural effusions and borderline cardiomegaly suggesting CHF. Electronically Signed   By: Lucrezia Europe M.D.   On: 04/18/2016 21:46   US Venous Img Lower Bilateral  04/19/2016  CLINICAL DATA:  Bilateral lower extremity edema for 2 weeks. EXAM: BILATERAL LOWER EXTREMITY VENOUS DOPPLER ULTRASOUND TECHNIQUE: Gray-scale sonography with graded compression, as well as color Doppler and duplex ultrasound were performed to evaluate the lower extremity deep venous systems from the level of the common femoral vein and including the common femoral, femoral, profunda femoral, popliteal and calf veins including the posterior tibial, peroneal and gastrocnemius veins when visible. The superficial great saphenous vein was also interrogated. Spectral Doppler was utilized to evaluate flow at rest and with distal augmentation maneuvers in the common femoral, femoral and popliteal veins. COMPARISON:  None. FINDINGS: RIGHT LOWER EXTREMITY Common Femoral Vein: No evidence of thrombus. Normal compressibility, respiratory phasicity and response to augmentation. Saphenofemoral Junction: No evidence of thrombus. Normal compressibility and flow on color Doppler imaging. Profunda Femoral Vein: No evidence of thrombus.  Normal compressibility and flow on color Doppler imaging. Femoral Vein: No evidence of thrombus. Normal compressibility, respiratory phasicity and response to augmentation. Popliteal Vein: No evidence of thrombus. Normal compressibility, respiratory phasicity and response to augmentation. Calf Veins: No evidence of thrombus. Normal compressibility and flow on color Doppler imaging. Superficial Great Saphenous Vein: No evidence of thrombus. Normal compressibility and flow on color Doppler imaging. Venous Reflux:  None. Other Findings:  None. LEFT LOWER EXTREMITY Common  Femoral Vein: No evidence of thrombus. Normal compressibility, respiratory phasicity and response to augmentation. Saphenofemoral Junction: No evidence of thrombus. Normal compressibility and flow on color Doppler imaging. Profunda Femoral Vein: No evidence of thrombus. Normal compressibility and flow on color Doppler imaging. Femoral Vein: No evidence of thrombus. Normal compressibility, respiratory phasicity and response to augmentation. Popliteal Vein: No evidence of thrombus. Normal compressibility, respiratory phasicity and response to augmentation. Calf Veins: No evidence of thrombus. Normal compressibility and flow on color Doppler imaging. Superficial Great Saphenous Vein: No evidence of thrombus. Normal compressibility and flow on color Doppler imaging. Venous Reflux:  None. Other Findings:  None. IMPRESSION: There is no evidence of deep venous thrombosis seen in either lower extremity. Electronically Signed   By: Marijo Conception, M.D.   On: 04/19/2016 10:26   Dg Chest Port 1 View  04/22/2016  CLINICAL DATA:  Cough EXAM: PORTABLE CHEST 1 VIEW COMPARISON:  04/18/2016 FINDINGS: Cardiomediastinal silhouette is stable. There is worsening in aeration. Central mild vascular congestion. There is hazy airspace opacification in right upper lobe. Consolidation is noted in right lower lobe. Small right pleural effusion. Findings highly suspicious for asymmetric infiltrates or edema. IMPRESSION: There is worsening in aeration. Central mild vascular congestion. There is hazy airspace opacification in right upper lobe. Consolidation is noted in right lower lobe. Small right pleural effusion. Findings highly suspicious for asymmetric infiltrates or edema. Electronically Signed   By: Lahoma Crocker M.D.   On: 04/22/2016 11:39   US Abdomen Limited Ruq  03/28/2016  CLINICAL DATA:  Follow-up cirrhosis ; history of tips in 2016 EXAM: US ABDOMEN LIMITED - RIGHT UPPER QUADRANT COMPARISON:  Abdominal ultrasound of November 29, 2015 FINDINGS: Gallbladder: The gallbladder is surgically absent. Common bile duct: Diameter: 7.3 mm.  No intraluminal stones are observed. Liver: The hepatic echotexture is coarse. The surface contour of the liver is irregular and nodular. There is no discrete hepatic mass. There is no intrahepatic ductal dilation. IMPRESSION: Stable cirrhotic changes within the liver. Normal appearance of the common bile duct. Electronically Signed   By: David  Martinique M.D.   On: 03/28/2016 10:31    Lab Results: Basic Metabolic Panel:  Recent Labs  04/24/16 0819 04/25/16 0459  NA 133* 131*  K 4.9 4.7  CL 106 105  CO2 23 23  GLUCOSE 95 93  BUN 54* 57*  CREATININE 1.95* 2.06*  CALCIUM 7.8* 7.3*   Liver Function Tests:  Recent Labs  04/24/16 0819  AST 44*  ALT 13*  ALKPHOS 89  BILITOT 3.4*  PROT 4.7*  ALBUMIN 2.6*     CBC: No results for input(s): WBC, NEUTROABS, HGB, HCT, MCV, PLT in the last 72 hours.  No results found for this or any previous visit (from the past 240 hour(s)).   Hospital Course:   This is a 60 years old female with history of multiple medical illnessess who admitted due to worsening lower extremity edema. Out patient diureses was attempted but patient condition got worse and she developed anasarca. Patient was  treated with iv diuretics She lost about 2 lbs but response to iv diuretics was no that great. She also cough and congestion while she in hospital requiring nebulizer treatment. Patient was started on oral Levaquin. Her respiratory symptoms resolved. Her peripheral edema is better. Most of her diuretics is worse due to worsening renal function is discharged in stable condition to be followed in out patient.   Discharge Exam: Blood pressure 120/83, pulse 68, temperature 98.8 F (37.1 C), temperature source Oral, resp. rate 20, height 5\' 4"  (1.626 m), weight 88.21 kg (194 lb 7.5 oz), SpO2 100 %.    Disposition:  home        Follow-up Information    Follow up  with [facility], [place] In 2 weeks.   Specialty:  Internal Medicine   Contact information:   [place] [address] [phone]       Follow up with Hildred Laser, MD In 2 weeks.   Specialty:  Gastroenterology   Contact information:   [place], [place] [address] [phone]       Signed: Halen Antenucci   04/25/2016, 8:13 AM

## 2016-04-25 NOTE — Progress Notes (Signed)
Patient ambulated in hallway without oxygen and sat remained at 90%.

## 2016-04-25 NOTE — Telephone Encounter (Signed)
Thank you Vicente Males.  We will take of this.  I will route labs to [facility] and an appt. To [facility].  Thank you for covering for [place] on this nice patient.

## 2016-04-25 NOTE — Telephone Encounter (Signed)
This nice lady is a patient of Dr. Olevia Perches, but we are covering for him this week. She will be discharged home today.   Please have a CPM rechecked Thursday or Friday at the latest. She will also need an appt with Dr. Brett Canales WEEK for close follow-up. Routing to Reba as well to ensure this can be done.

## 2016-04-25 NOTE — Telephone Encounter (Signed)
Noted  

## 2016-04-25 NOTE — Progress Notes (Signed)
Pt d/c home, taken down via wheelchair and left with husband. Discharge instructions and prescriptions given. All questions/concerns answered. Belongings w/ pt.

## 2016-04-26 ENCOUNTER — Telehealth (INDEPENDENT_AMBULATORY_CARE_PROVIDER_SITE_OTHER): Payer: Self-pay | Admitting: *Deleted

## 2016-04-26 DIAGNOSIS — K7290 Hepatic failure, unspecified without coma: Secondary | ICD-10-CM

## 2016-04-26 DIAGNOSIS — K7031 Alcoholic cirrhosis of liver with ascites: Secondary | ICD-10-CM

## 2016-04-26 DIAGNOSIS — K7682 Hepatic encephalopathy: Secondary | ICD-10-CM

## 2016-04-26 NOTE — Telephone Encounter (Signed)
Patient is to have this lab per Laban Emperor, this is post patient being in the hospital.

## 2016-04-26 NOTE — Telephone Encounter (Signed)
Lab ordered for this Thursday or Friday.

## 2016-04-26 NOTE — Telephone Encounter (Signed)
Reordered as it needed to be under [facility] for GI Diseases.

## 2016-04-26 NOTE — Addendum Note (Signed)
Addended by: Grayland Ormond on: 04/26/2016 10:09 AM   Modules accepted: Orders

## 2016-04-26 NOTE — Addendum Note (Signed)
Addended by: Grayland Ormond on: 04/26/2016 10:08 AM   Modules accepted: Orders

## 2016-04-27 LAB — COMPREHENSIVE METABOLIC PANEL
ALK PHOS: 138 U/L — AB (ref 33–130)
ALT: 18 U/L (ref 6–29)
AST: 72 U/L — AB (ref 10–35)
Albumin: 2 g/dL — ABNORMAL LOW (ref 3.6–5.1)
BILIRUBIN TOTAL: 1.4 mg/dL — AB (ref 0.2–1.2)
BUN: 49 mg/dL — AB (ref 7–25)
CO2: 22 mmol/L (ref 20–31)
Calcium: 7.6 mg/dL — ABNORMAL LOW (ref 8.6–10.4)
Chloride: 106 mmol/L (ref 98–110)
Creat: 1.81 mg/dL — ABNORMAL HIGH (ref 0.50–0.99)
Glucose, Bld: 87 mg/dL (ref 65–99)
POTASSIUM: 5 mmol/L (ref 3.5–5.3)
Sodium: 137 mmol/L (ref 135–146)
TOTAL PROTEIN: 4.2 g/dL — AB (ref 6.1–8.1)

## 2016-04-27 NOTE — Telephone Encounter (Signed)
Patient was given an appointment for 05/04/16 at 2:45pm on Terri's schedule, but noted for her to pull Dr. Laural Golden in as well.  I did call the patient and left her a message regarding her appointment day and time.

## 2016-05-04 ENCOUNTER — Encounter (INDEPENDENT_AMBULATORY_CARE_PROVIDER_SITE_OTHER): Payer: Self-pay | Admitting: Internal Medicine

## 2016-05-04 ENCOUNTER — Ambulatory Visit (INDEPENDENT_AMBULATORY_CARE_PROVIDER_SITE_OTHER): Payer: Commercial Managed Care - HMO | Admitting: Internal Medicine

## 2016-05-04 VITALS — BP 100/60 | HR 56 | Ht 63.0 in | Wt 186.7 lb

## 2016-05-04 DIAGNOSIS — K7031 Alcoholic cirrhosis of liver with ascites: Secondary | ICD-10-CM | POA: Diagnosis not present

## 2016-05-04 DIAGNOSIS — K7682 Hepatic encephalopathy: Secondary | ICD-10-CM

## 2016-05-04 DIAGNOSIS — K7290 Hepatic failure, unspecified without coma: Secondary | ICD-10-CM

## 2016-05-04 DIAGNOSIS — I10 Essential (primary) hypertension: Secondary | ICD-10-CM | POA: Diagnosis not present

## 2016-05-04 DIAGNOSIS — K7469 Other cirrhosis of liver: Secondary | ICD-10-CM

## 2016-05-04 DIAGNOSIS — F1021 Alcohol dependence, in remission: Secondary | ICD-10-CM | POA: Diagnosis not present

## 2016-05-04 DIAGNOSIS — D649 Anemia, unspecified: Secondary | ICD-10-CM | POA: Diagnosis not present

## 2016-05-04 NOTE — Progress Notes (Addendum)
 Subjective:    Patient ID: Savannah Jackson, female    DOB: 08/07/1956, 60 y.o.   MRN: [id number]  HPI  Here today for f/u. Hx of decompensated liver disease and Hepatic encephalopathy. Recently in hospital with fluid overload. She tells me she is doing pretty good. She has 3+ edema to both extremities below the knee. She denies any SOB. She is taking lactulose QID and the Xifaxan. She denies any periods of confusion.  She is eating 3 meals a day.  She is drinking about two eight ounce cups of fluid a day or ice. She does crunch on ice.  She feels pretty good. She denies any confusion.  She denies any joint pain. Hx of rt knee replacement. She does c/o some back pain. She denies chest pain.  She denies any abdominal pain. No melena or BRRB.  Her admission weight in June was 194. Today her weight is 186.7 Has appt with Dr. Hayashi May 22, 2016 for consideration of liver transplant.   Hepatic Function Latest Ref Rng 04/26/2016 04/24/2016 04/22/2016  Total Protein 6.1 - 8.1 g/dL 4.2(L) 4.7(L) 5.4(L)  Albumin 3.6 - 5.1 g/dL 2.0(L) 2.6(L) 3.5  AST 10 - 35 U/L 72(H) 44(H) 43(H)  ALT 6 - 29 U/L 18 13(L) 12(L)  Alk Phosphatase 33 - 130 U/L 138(H) 89 88  Total Bilirubin 0.2 - 1.2 mg/dL 1.4(H) 3.4(H) 4.0(H)   CBC    Component Value Date/Time   WBC 8.2 04/19/2016 0426   RBC 3.95 04/19/2016 0426   HGB 11.0* 04/19/2016 0426   HCT 33.0* 04/19/2016 0426   PLT 138* 04/19/2016 0426   MCV 83.5 04/19/2016 0426   MCH 27.8 04/19/2016 0426   MCHC 33.3 04/19/2016 0426   RDW 15.1 04/19/2016 0426   LYMPHSABS 1.9 04/18/2016 2113   MONOABS 0.9 04/18/2016 2113   EOSABS 0.6 04/18/2016 2113   BASOSABS 0.2* 04/18/2016 2113        Review of Systems Past Medical History  Diagnosis Date  . Hypertension   . Brain aneurysm   . Rotator cuff syndrome of left shoulder   . DDD (degenerative disc disease), lumbar   . Chronic back pain Diverticultis  . Cirrhosis (HCC)     alcoholic  . Sleep apnea   .  ETOH abuse   . Bilateral leg edema   . Hepatomegaly     scope 2014  . Diverticulosis     scope 2014  . Cancer (HCC)     breast cancer    Past Surgical History  Procedure Laterality Date  . Cholecystectomy    . Brain surgery      aneurysm at [facility]  . Breast surgery      cancer  . Total knee arthroplasty      right. 2002  . Cataract extraction Bilateral     [facility] 2 or 3 years ago  . Colonoscopy N/A 04/10/2013    Procedure: COLONOSCOPY;  Surgeon: Najeeb U Rehman, MD;  [facility];  Service: Endoscopy;  Laterality: N/A;  1030-rescheduled to 1200 Ann notified pt  . Esophagogastroduodenoscopy N/A 04/22/2015    Procedure: ESOPHAGOGASTRODUODENOSCOPY (EGD);  Surgeon: Najeeb U Rehman, MD;  Location: [facility];  Service: Endoscopy;  Laterality: N/A;  . Radiology with anesthesia N/A 07/16/2015    Procedure: TIPS;  Surgeon: Medication Radiologist, MD;  Location: [facility];  Service: Radiology;  Laterality: N/A;    Allergies  Allergen Reactions  . Latex Swelling    Discoloration of skin.   .   Tylenol [Acetaminophen] Palpitations    Says doctor told her not to take   . Vicodin [Hydrocodone-Acetaminophen] Itching    Current Outpatient Prescriptions on File Prior to Visit  Medication Sig Dispense Refill  . acetaminophen (TYLENOL) 325 MG tablet Take 325 mg by mouth 2 (two) times daily as needed for moderate pain.    . folic acid (FOLVITE) 1 MG tablet Take 1 tablet (1 mg total) by mouth daily. 30 tablet 3  . lactulose (CHRONULAC) 10 GM/15ML solution Take 22.5 mLs (15 g total) by mouth 4 (four) times daily. Four times a day 2700 mL 5  . levofloxacin (LEVAQUIN) 500 MG tablet Take 1 tablet (500 mg total) by mouth every other day. 10 tablet 0  . nadolol (CORGARD) 20 MG tablet Take 10 mg by mouth daily.     . rifaximin (XIFAXAN) 550 MG TABS tablet Take 550 mg by mouth 2 (two) times daily.    Marland Kitchen spironolactone (ALDACTONE) 25 MG tablet Take 1 tablet (25 mg total) by mouth daily. 30 tablet  3  . thiamine 100 MG tablet Take 1 tablet (100 mg total) by mouth daily. 30 tablet 3   No current facility-administered medications on file prior to visit.        Objective:   Physical ExamBlood pressure 100/60, pulse 56, height 5' 3" (1.6 m), weight 186 lb 11.2 oz (84.687 kg). Alert and oriented. Skin warm and dry. Oral mucosa is moist.   . Sclera anicteric, conjunctivae is pink. Thyroid not enlarged. No cervical lymphadenopathy. Lungs clear. Heart regular rate and rhythm.  Abdomen is soft. Bowel sounds are positive. No hepatomegaly. No abdominal masses felt. No tenderness.2-3 + edema to lower extremities (below the knee).  Ambulates without difficulty.        Assessment & Plan:  Decompensated liver disease. Fluid overload.  Weight is down. She has restricted her fluid intake.  Has appt with Dr. Monica Martinez in July for consideration of liver transplant. OV in 4 weeks.  Met 7 next week.

## 2016-05-04 NOTE — Patient Instructions (Signed)
Met 7 next week.  OV 4 weeks  

## 2016-05-05 ENCOUNTER — Telehealth (INDEPENDENT_AMBULATORY_CARE_PROVIDER_SITE_OTHER): Payer: Self-pay | Admitting: *Deleted

## 2016-05-05 DIAGNOSIS — K7469 Other cirrhosis of liver: Secondary | ICD-10-CM

## 2016-05-05 NOTE — Telephone Encounter (Signed)
.  Per Lelon Perla the patient will need to have B-Met the week of 05/08/16.

## 2016-05-10 DIAGNOSIS — F1020 Alcohol dependence, uncomplicated: Secondary | ICD-10-CM | POA: Diagnosis not present

## 2016-05-10 DIAGNOSIS — K7031 Alcoholic cirrhosis of liver with ascites: Secondary | ICD-10-CM | POA: Diagnosis not present

## 2016-05-10 DIAGNOSIS — K7290 Hepatic failure, unspecified without coma: Secondary | ICD-10-CM | POA: Diagnosis not present

## 2016-05-15 DIAGNOSIS — G8929 Other chronic pain: Secondary | ICD-10-CM | POA: Diagnosis not present

## 2016-05-15 DIAGNOSIS — K7031 Alcoholic cirrhosis of liver with ascites: Secondary | ICD-10-CM | POA: Diagnosis not present

## 2016-05-15 DIAGNOSIS — K838 Other specified diseases of biliary tract: Secondary | ICD-10-CM | POA: Diagnosis not present

## 2016-05-15 DIAGNOSIS — K5790 Diverticulosis of intestine, part unspecified, without perforation or abscess without bleeding: Secondary | ICD-10-CM | POA: Diagnosis not present

## 2016-05-15 DIAGNOSIS — J90 Pleural effusion, not elsewhere classified: Secondary | ICD-10-CM | POA: Diagnosis not present

## 2016-05-15 DIAGNOSIS — K766 Portal hypertension: Secondary | ICD-10-CM | POA: Diagnosis not present

## 2016-05-15 DIAGNOSIS — Z886 Allergy status to analgesic agent status: Secondary | ICD-10-CM | POA: Diagnosis not present

## 2016-05-15 DIAGNOSIS — K7040 Alcoholic hepatic failure without coma: Secondary | ICD-10-CM | POA: Diagnosis not present

## 2016-05-15 DIAGNOSIS — I8510 Secondary esophageal varices without bleeding: Secondary | ICD-10-CM | POA: Diagnosis not present

## 2016-05-16 ENCOUNTER — Encounter (INDEPENDENT_AMBULATORY_CARE_PROVIDER_SITE_OTHER): Payer: Self-pay

## 2016-05-18 DIAGNOSIS — F1020 Alcohol dependence, uncomplicated: Secondary | ICD-10-CM | POA: Diagnosis not present

## 2016-05-23 ENCOUNTER — Ambulatory Visit (INDEPENDENT_AMBULATORY_CARE_PROVIDER_SITE_OTHER): Payer: Commercial Managed Care - HMO | Admitting: Internal Medicine

## 2016-05-24 DIAGNOSIS — K7031 Alcoholic cirrhosis of liver with ascites: Secondary | ICD-10-CM | POA: Diagnosis not present

## 2016-05-24 DIAGNOSIS — K7290 Hepatic failure, unspecified without coma: Secondary | ICD-10-CM | POA: Diagnosis not present

## 2016-05-30 DIAGNOSIS — K7290 Hepatic failure, unspecified without coma: Secondary | ICD-10-CM | POA: Diagnosis not present

## 2016-05-31 ENCOUNTER — Other Ambulatory Visit (HOSPITAL_COMMUNITY): Payer: Self-pay | Admitting: Interventional Radiology

## 2016-05-31 ENCOUNTER — Other Ambulatory Visit: Payer: Self-pay | Admitting: Radiology

## 2016-05-31 DIAGNOSIS — Z95828 Presence of other vascular implants and grafts: Secondary | ICD-10-CM

## 2016-05-31 DIAGNOSIS — K769 Liver disease, unspecified: Principal | ICD-10-CM

## 2016-05-31 DIAGNOSIS — J90 Pleural effusion, not elsewhere classified: Secondary | ICD-10-CM

## 2016-05-31 DIAGNOSIS — K7031 Alcoholic cirrhosis of liver with ascites: Secondary | ICD-10-CM

## 2016-05-31 DIAGNOSIS — J918 Pleural effusion in other conditions classified elsewhere: Secondary | ICD-10-CM

## 2016-06-01 ENCOUNTER — Ambulatory Visit (INDEPENDENT_AMBULATORY_CARE_PROVIDER_SITE_OTHER): Payer: Commercial Managed Care - HMO | Admitting: Internal Medicine

## 2016-06-02 ENCOUNTER — Encounter: Payer: Self-pay | Admitting: Adult Health

## 2016-06-07 ENCOUNTER — Ambulatory Visit (INDEPENDENT_AMBULATORY_CARE_PROVIDER_SITE_OTHER): Payer: Commercial Managed Care - HMO | Admitting: Adult Health

## 2016-06-07 ENCOUNTER — Encounter: Payer: Self-pay | Admitting: Adult Health

## 2016-06-07 ENCOUNTER — Other Ambulatory Visit (HOSPITAL_COMMUNITY)
Admission: RE | Admit: 2016-06-07 | Discharge: 2016-06-07 | Disposition: A | Discharge status: Home / Self Care | Payer: Commercial Managed Care - HMO | Source: Ambulatory Visit | Attending: Adult Health | Admitting: Adult Health

## 2016-06-07 VITALS — BP 132/72 | HR 46 | Ht 63.0 in | Wt 200.5 lb

## 2016-06-07 DIAGNOSIS — Z1151 Encounter for screening for human papillomavirus (HPV): Secondary | ICD-10-CM | POA: Diagnosis not present

## 2016-06-07 DIAGNOSIS — Z853 Personal history of malignant neoplasm of breast: Secondary | ICD-10-CM | POA: Diagnosis not present

## 2016-06-07 DIAGNOSIS — N950 Postmenopausal bleeding: Secondary | ICD-10-CM | POA: Diagnosis not present

## 2016-06-07 DIAGNOSIS — Z1212 Encounter for screening for malignant neoplasm of rectum: Secondary | ICD-10-CM

## 2016-06-07 DIAGNOSIS — Z113 Encounter for screening for infections with a predominantly sexual mode of transmission: Secondary | ICD-10-CM | POA: Insufficient documentation

## 2016-06-07 DIAGNOSIS — Z124 Encounter for screening for malignant neoplasm of cervix: Secondary | ICD-10-CM

## 2016-06-07 DIAGNOSIS — Z139 Encounter for screening, unspecified: Secondary | ICD-10-CM

## 2016-06-07 DIAGNOSIS — Z01419 Encounter for gynecological examination (general) (routine) without abnormal findings: Secondary | ICD-10-CM | POA: Insufficient documentation

## 2016-06-07 HISTORY — DX: Personal history of malignant neoplasm of breast: Z85.3

## 2016-06-07 LAB — HEMOCCULT GUIAC POC 1CARD (OFFICE): FECAL OCCULT BLD: NEGATIVE

## 2016-06-07 NOTE — Progress Notes (Signed)
Subjective:     Patient ID: Savannah Jackson, female   DOB: 12-22-1955, 60 y.o.   MRN: [id number]  HPI Savannah Jackson is a 60 year old black female, married in for pap smear, she is a new pt. Her PCP is Dr Legrand Rams and she has had a physical with him. She had breast cancer in 2002 on the right.She is trying to get on liver transplant list,she has cirrhosis. She has had some vaginal spotting on and off, no period since 2002, she is not having sex, her husband is double amputee. She says she has frequent BMs on lactulose and peeing is fine.She has had right knee replaced and has swelling in both legs and they ache.She used to drink alcohol, but no more, she says she wishes she had stopped sooner.  Review of Systems + vaginal spotting, see HPI for more positives and negatives  Reviewed past medical,surgical, social and family history. Reviewed medications and allergies.     Objective:   Physical Exam BP 132/72 (BP Location: Left Arm, Patient Position: Sitting, Cuff Size: Normal)   Pulse (!) 46   Ht 5\' 3"  (1.6 m)   Wt 200 lb 8 oz (90.9 kg)   BMI 35.52 kg/m    Skin warm and dry.Pelvic: external genitalia is normal in appearance no lesions, vagina: pale with loss of color, moisture and rugae,urethra has no lesions or masses noted, cervix:stentotic os,friable with EC brush when pap with HPV and GC/CHL performed, uterus: normal size, shape and contour, non tender, no masses felt, adnexa: no masses or tenderness noted. Bladder is non tender and no masses felt. On rectal exam has good tone, no polyps or hemorrhoids felt, hemoccult was negative. Discussed that since she is having PMB, will get [place] to assess.She has  appts in [place] next week too.  Face time 20 minutes.  Assessment:     Pap smear PMB History of breast cancer     Plan:     Return in 1 week for gyn [place] Review handout on PMB  Pap in 3 years if normal today

## 2016-06-07 NOTE — Patient Instructions (Signed)
Postmenopausal Bleeding Postmenopausal bleeding is any bleeding a woman has after she has entered into menopause. Menopause is the end of a woman's fertile years. After menopause, a woman no longer ovulates or has menstrual periods.  Postmenopausal bleeding can be caused by various things. Any type of postmenopausal bleeding, even if it appears to be a typical menstrual period, is concerning. This should be evaluated by your health care provider. Any treatment will depend on the cause of the bleeding. HOME CARE INSTRUCTIONS Monitor your condition for any changes. The following actions may help to alleviate any discomfort you are experiencing:  Avoid the use of tampons and douches as directed by your health care provider.  Change your pads frequently.  Get regular pelvic exams and Pap tests.  Keep all follow-up appointments for diagnostic tests as directed by your health care provider. SEEK MEDICAL CARE IF:   Your bleeding lasts more than 1 week.  You have abdominal pain.  You have bleeding with sexual intercourse. SEEK IMMEDIATE MEDICAL CARE IF:   You have a fever, chills, headache, dizziness, muscle aches, and bleeding.  You have severe pain with bleeding.  You are passing blood clots.  You have bleeding and need more than 1 pad an hour.  You feel faint. MAKE SURE YOU:  Understand these instructions.  Will watch your condition.  Will get help right away if you are not doing well or get worse.   This information is not intended to replace advice given to you by your health care provider. Make sure you discuss any questions you have with your health care provider.   Document Released: 01/24/2006 Document Revised: 08/06/2013 Document Reviewed: 05/15/2013 Elsevier Interactive Patient Education [employer]. Return in 1 week for [place]

## 2016-06-08 LAB — CYTOLOGY - PAP

## 2016-06-13 ENCOUNTER — Ambulatory Visit (INDEPENDENT_AMBULATORY_CARE_PROVIDER_SITE_OTHER): Payer: Commercial Managed Care - HMO | Admitting: Internal Medicine

## 2016-06-13 DIAGNOSIS — Z01812 Encounter for preprocedural laboratory examination: Secondary | ICD-10-CM | POA: Diagnosis not present

## 2016-06-13 DIAGNOSIS — K7290 Hepatic failure, unspecified without coma: Secondary | ICD-10-CM | POA: Diagnosis not present

## 2016-06-13 DIAGNOSIS — R5383 Other fatigue: Secondary | ICD-10-CM | POA: Diagnosis not present

## 2016-06-13 DIAGNOSIS — K7030 Alcoholic cirrhosis of liver without ascites: Secondary | ICD-10-CM | POA: Diagnosis not present

## 2016-06-13 DIAGNOSIS — I10 Essential (primary) hypertension: Secondary | ICD-10-CM | POA: Diagnosis not present

## 2016-06-13 DIAGNOSIS — K5792 Diverticulitis of intestine, part unspecified, without perforation or abscess without bleeding: Secondary | ICD-10-CM | POA: Diagnosis not present

## 2016-06-13 DIAGNOSIS — R609 Edema, unspecified: Secondary | ICD-10-CM | POA: Diagnosis not present

## 2016-06-13 DIAGNOSIS — Z01818 Encounter for other preprocedural examination: Secondary | ICD-10-CM | POA: Diagnosis not present

## 2016-06-13 DIAGNOSIS — R188 Other ascites: Secondary | ICD-10-CM | POA: Diagnosis not present

## 2016-06-14 DIAGNOSIS — Z01818 Encounter for other preprocedural examination: Secondary | ICD-10-CM | POA: Diagnosis not present

## 2016-06-14 DIAGNOSIS — I8510 Secondary esophageal varices without bleeding: Secondary | ICD-10-CM | POA: Diagnosis not present

## 2016-06-14 DIAGNOSIS — Z6836 Body mass index (BMI) 36.0-36.9, adult: Secondary | ICD-10-CM | POA: Diagnosis not present

## 2016-06-14 DIAGNOSIS — K7031 Alcoholic cirrhosis of liver with ascites: Secondary | ICD-10-CM | POA: Diagnosis not present

## 2016-06-14 DIAGNOSIS — M85862 Other specified disorders of bone density and structure, left lower leg: Secondary | ICD-10-CM | POA: Diagnosis not present

## 2016-06-14 DIAGNOSIS — K7040 Alcoholic hepatic failure without coma: Secondary | ICD-10-CM | POA: Diagnosis not present

## 2016-06-14 DIAGNOSIS — Z886 Allergy status to analgesic agent status: Secondary | ICD-10-CM | POA: Diagnosis not present

## 2016-06-14 DIAGNOSIS — K7290 Hepatic failure, unspecified without coma: Secondary | ICD-10-CM | POA: Diagnosis not present

## 2016-06-14 DIAGNOSIS — Z885 Allergy status to narcotic agent status: Secondary | ICD-10-CM | POA: Diagnosis not present

## 2016-06-14 DIAGNOSIS — R600 Localized edema: Secondary | ICD-10-CM | POA: Diagnosis not present

## 2016-06-14 DIAGNOSIS — I10 Essential (primary) hypertension: Secondary | ICD-10-CM | POA: Diagnosis not present

## 2016-06-16 ENCOUNTER — Ambulatory Visit (INDEPENDENT_AMBULATORY_CARE_PROVIDER_SITE_OTHER): Payer: Commercial Managed Care - HMO

## 2016-06-16 DIAGNOSIS — N950 Postmenopausal bleeding: Secondary | ICD-10-CM

## 2016-06-16 DIAGNOSIS — D251 Intramural leiomyoma of uterus: Secondary | ICD-10-CM | POA: Diagnosis not present

## 2016-06-16 DIAGNOSIS — N854 Malposition of uterus: Secondary | ICD-10-CM | POA: Diagnosis not present

## 2016-06-16 NOTE — Progress Notes (Signed)
PELVIC US TA/TV: Heterogenous anteverted uterus with an anterior left intramural fibroid 3.9 x 3.5 x 3.8 cm,thickened endometrium w/a 4.3 mm calcification w/in the endometrium,EEC 22 mm,normal ov's bilat,limited view of left ovary,no free fluid,no pain during ultrasound

## 2016-06-19 ENCOUNTER — Encounter: Payer: Self-pay | Admitting: Adult Health

## 2016-06-19 ENCOUNTER — Telehealth: Payer: Self-pay | Admitting: Adult Health

## 2016-06-19 DIAGNOSIS — Z01818 Encounter for other preprocedural examination: Secondary | ICD-10-CM | POA: Diagnosis not present

## 2016-06-19 DIAGNOSIS — I517 Cardiomegaly: Secondary | ICD-10-CM | POA: Diagnosis not present

## 2016-06-19 DIAGNOSIS — K7469 Other cirrhosis of liver: Secondary | ICD-10-CM | POA: Diagnosis not present

## 2016-06-19 DIAGNOSIS — Z01810 Encounter for preprocedural cardiovascular examination: Secondary | ICD-10-CM | POA: Diagnosis not present

## 2016-06-19 DIAGNOSIS — Z79899 Other long term (current) drug therapy: Secondary | ICD-10-CM | POA: Diagnosis not present

## 2016-06-19 DIAGNOSIS — I519 Heart disease, unspecified: Secondary | ICD-10-CM | POA: Diagnosis not present

## 2016-06-19 DIAGNOSIS — K7290 Hepatic failure, unspecified without coma: Secondary | ICD-10-CM | POA: Diagnosis not present

## 2016-06-19 DIAGNOSIS — R9389 Abnormal findings on diagnostic imaging of other specified body structures: Secondary | ICD-10-CM

## 2016-06-19 HISTORY — DX: Abnormal findings on diagnostic imaging of other specified body structures: R93.89

## 2016-06-19 NOTE — Telephone Encounter (Signed)
Left message to call about [place], she needs endo biopsy due to thickened endometrium

## 2016-06-20 ENCOUNTER — Telehealth: Payer: Self-pay | Admitting: Adult Health

## 2016-06-20 DIAGNOSIS — I10 Essential (primary) hypertension: Secondary | ICD-10-CM | POA: Diagnosis not present

## 2016-06-20 DIAGNOSIS — K5792 Diverticulitis of intestine, part unspecified, without perforation or abscess without bleeding: Secondary | ICD-10-CM | POA: Diagnosis not present

## 2016-06-20 DIAGNOSIS — Z7682 Awaiting organ transplant status: Secondary | ICD-10-CM | POA: Diagnosis not present

## 2016-06-20 DIAGNOSIS — K7290 Hepatic failure, unspecified without coma: Secondary | ICD-10-CM | POA: Diagnosis not present

## 2016-06-20 DIAGNOSIS — K7460 Unspecified cirrhosis of liver: Secondary | ICD-10-CM | POA: Diagnosis not present

## 2016-06-20 DIAGNOSIS — Z01818 Encounter for other preprocedural examination: Secondary | ICD-10-CM | POA: Diagnosis not present

## 2016-06-20 DIAGNOSIS — K7030 Alcoholic cirrhosis of liver without ascites: Secondary | ICD-10-CM | POA: Diagnosis not present

## 2016-06-20 NOTE — Telephone Encounter (Signed)
Pt aware US showed thickened endometrium will get endometrial biopsy with Dr Elonda Husky

## 2016-06-21 ENCOUNTER — Telehealth (INDEPENDENT_AMBULATORY_CARE_PROVIDER_SITE_OTHER): Payer: Self-pay | Admitting: *Deleted

## 2016-06-21 ENCOUNTER — Other Ambulatory Visit (INDEPENDENT_AMBULATORY_CARE_PROVIDER_SITE_OTHER): Payer: Self-pay | Admitting: *Deleted

## 2016-06-21 ENCOUNTER — Encounter (INDEPENDENT_AMBULATORY_CARE_PROVIDER_SITE_OTHER): Payer: Self-pay | Admitting: Internal Medicine

## 2016-06-21 ENCOUNTER — Ambulatory Visit (INDEPENDENT_AMBULATORY_CARE_PROVIDER_SITE_OTHER): Payer: Commercial Managed Care - HMO | Admitting: Internal Medicine

## 2016-06-21 VITALS — BP 118/70 | HR 62 | Temp 97.9°F | Resp 18 | Ht 63.0 in | Wt 206.3 lb

## 2016-06-21 DIAGNOSIS — K7469 Other cirrhosis of liver: Secondary | ICD-10-CM

## 2016-06-21 LAB — COMPREHENSIVE METABOLIC PANEL
ALK PHOS: 154 U/L — AB (ref 33–130)
ALT: 13 U/L (ref 6–29)
AST: 54 U/L — AB (ref 10–35)
BILIRUBIN TOTAL: 1.2 mg/dL (ref 0.2–1.2)
BUN: 36 mg/dL — ABNORMAL HIGH (ref 7–25)
CALCIUM: 7.7 mg/dL — AB (ref 8.6–10.4)
CO2: 25 mmol/L (ref 20–31)
Chloride: 108 mmol/L (ref 98–110)
Creat: 1.53 mg/dL — ABNORMAL HIGH (ref 0.50–0.99)
GLUCOSE: 85 mg/dL (ref 65–99)
POTASSIUM: 4.9 mmol/L (ref 3.5–5.3)
Sodium: 139 mmol/L (ref 135–146)
TOTAL PROTEIN: 4 g/dL — AB (ref 6.1–8.1)

## 2016-06-21 LAB — AMYLASE: AMYLASE: 170 U/L — AB (ref 0–105)

## 2016-06-21 LAB — LIPASE: Lipase: 31 U/L (ref 7–60)

## 2016-06-21 NOTE — Patient Instructions (Addendum)
  OV in 6 weeks. 

## 2016-06-21 NOTE — Telephone Encounter (Signed)
Labs ordered per Terri.

## 2016-06-21 NOTE — Progress Notes (Signed)
Subjective:    Patient ID: Savannah Jackson, female    DOB: 07/22/1956, 60 y.o.   MRN: [id number]  HPI Here today for f/u. Hx of decompensated liver disease and Hepatic encephalopathy. Today weight 206. 7/6./2017 weight 186. She tells me she is doing good. She is eating 2 meals a day. She is drinking 2 eight oz of fluid a day. No confusion. No chest pain. No abdominal pain. She does have some SOB. She is not drinking.  Seen at [facility] May 22, 2016 for consideration of liver transplant.  She has some abdominal distention.  (2016 IR Thoracentesis.  She has a BM 3-4 times a day  06/14/2016 H and H 11.1 and 34.9 BUN 41, Creatinine 66. Amylase 199, Lipase 300.    CMP Latest Ref Rng & Units 04/26/2016 04/25/2016 04/24/2016  Glucose 65 - 99 mg/dL 87 93 95  BUN 7 - 25 mg/dL 49(H) 57(H) 54(H)  Creatinine 0.50 - 0.99 mg/dL 1.81(H) 2.06(H) 1.95(H)  Sodium 135 - 146 mmol/L 137 131(L) 133(L)  Potassium 3.5 - 5.3 mmol/L 5.0 4.7 4.9  Chloride 98 - 110 mmol/L 106 105 106  CO2 20 - 31 mmol/L 22 23 23   Calcium 8.6 - 10.4 mg/dL 7.6(L) 7.3(L) 7.8(L)  Total Protein 6.1 - 8.1 g/dL 4.2(L) - 4.7(L)  Total Bilirubin 0.2 - 1.2 mg/dL 1.4(H) - 3.4(H)  Alkaline Phos 33 - 130 U/L 138(H) - 89  AST 10 - 35 U/L 72(H) - 44(H)  ALT 6 - 29 U/L 18 - 13(L)       Review of Systems Past Medical History:  Diagnosis Date  . Bilateral leg edema   . Brain aneurysm   . Breast disorder    right breast cancer 2002  . Cancer [facility])    breast cancer  . Chronic back pain Diverticultis  . Cirrhosis ([place])    alcoholic  . DDD (degenerative disc disease), lumbar   . Diverticulosis    scope 2014  . ETOH abuse   . Hepatomegaly    scope 2014  . History of breast cancer 06/07/2016  . Hypertension   . Rotator cuff syndrome of left shoulder   . Sleep apnea   . Thickened endometrium 06/19/2016   Will get endo biopsy     Past Surgical History:  Procedure Laterality Date  . BRAIN SURGERY     aneurysm at [place]  . CATARACT EXTRACTION Bilateral    [facility] 2 or 3 years ago  . CHOLECYSTECTOMY    . COLONOSCOPY N/A 04/10/2013   Procedure: COLONOSCOPY;  Surgeon: Rogene Houston, MD;  [facility];  Service: Endoscopy;  Laterality: N/A;  1030-rescheduled to 1200 Ann notified pt  . ESOPHAGOGASTRODUODENOSCOPY N/A 04/22/2015   Procedure: ESOPHAGOGASTRODUODENOSCOPY (EGD);  Surgeon: Rogene Houston, MD;  [facility];  Service: Endoscopy;  Laterality: N/A;  . RADIOLOGY WITH ANESTHESIA N/A 07/16/2015   Procedure: TIPS;  Surgeon: Medication Radiologist, MD;  Location: [place];  Service: Radiology;  Laterality: N/A;  . TOTAL KNEE ARTHROPLASTY     right. 2002    Allergies  Allergen Reactions  . Latex Swelling    Discoloration of skin.   . Tylenol [Acetaminophen] Palpitations    Says doctor told her not to take   . Vicodin [Hydrocodone-Acetaminophen] Itching    Current Outpatient Prescriptions on File Prior to Visit  Medication Sig Dispense Refill  . acetaminophen (TYLENOL) 325 MG tablet Take 325  mg by mouth 2 (two) times daily as needed for moderate pain.    . folic acid (FOLVITE) 1 MG tablet Take 1 tablet (1 mg total) by mouth daily. 30 tablet 3  . lactulose (CHRONULAC) 10 GM/15ML solution Take 22.5 mLs (15 g total) by mouth 4 (four) times daily. Four times a day 2700 mL 5  . rifaximin (XIFAXAN) 550 MG TABS tablet Take 550 mg by mouth 2 (two) times daily.    Marland Kitchen spironolactone (ALDACTONE) 25 MG tablet Take 1 tablet (25 mg total) by mouth daily. 30 tablet 3  . thiamine 100 MG tablet Take 1 tablet (100 mg total) by mouth daily. 30 tablet 3  . torsemide (DEMADEX) 20 MG tablet Take 30 mg by mouth daily.      No current facility-administered medications on file prior to visit.        Objective:   Physical Exam Blood pressure 118/70, pulse 62, temperature 97.9 F (36.6 C), temperature source Oral, resp. rate 18, height 5\' 3"  (1.6 m), weight 206 lb 4.8 oz (93.6  kg). Alert and oriented. Skin warm and dry. Oral mucosa is moist.   . Sclera anicteric, conjunctivae is pink. Thyroid not enlarged. No cervical lymphadenopathy. Lungs clear. Heart regular rate and rhythm.  Abdomen is soft. Bowel sounds are positive. No hepatomegaly. No abdominal masses felt. No tenderness.  N3-4+ edema to lower extremities just above knee.         Assessment & Plan:  Decompensated Liver disease. Am going to get a CMET, ammonia, Lipase, Amylase, OV in 6 weeks. Will discuss with Dr. Laural Golden.   Presently being evaluated for possible liver transplant.

## 2016-06-22 LAB — AMMONIA: AMMONIA: 65 umol/L — AB (ref <=47)

## 2016-06-23 ENCOUNTER — Telehealth (INDEPENDENT_AMBULATORY_CARE_PROVIDER_SITE_OTHER): Payer: Self-pay | Admitting: *Deleted

## 2016-06-23 DIAGNOSIS — K7031 Alcoholic cirrhosis of liver with ascites: Secondary | ICD-10-CM

## 2016-06-23 DIAGNOSIS — K7290 Hepatic failure, unspecified without coma: Secondary | ICD-10-CM

## 2016-06-23 DIAGNOSIS — K7682 Hepatic encephalopathy: Secondary | ICD-10-CM

## 2016-06-23 NOTE — Telephone Encounter (Signed)
Patient called as she states Terri told her to call me for her results. After reviewing her labs and the patient sharing that she had a weight gain. Dr.Rehman was called.  Per Dr.Rehman the patient is to have Albumin 50 mg IV for three days starting 06/26/2016. She will have a B-Met on Monday. Patient is to take Torsemide 30 mg po twice a day starting today through [place] which time this will be re--addressed by Dr.Rehman.  An order has been sent to [facility] and a Rx for medication was left on the Pharmacist VM at [facility].  Patient is aware.Marland Kitchen

## 2016-06-26 ENCOUNTER — Encounter (HOSPITAL_COMMUNITY)
Admission: RE | Admit: 2016-06-26 | Discharge: 2016-06-26 | Disposition: A | Discharge status: Home / Self Care | Payer: Commercial Managed Care - HMO | Source: Ambulatory Visit | Attending: Internal Medicine | Admitting: Internal Medicine

## 2016-06-26 ENCOUNTER — Other Ambulatory Visit (INDEPENDENT_AMBULATORY_CARE_PROVIDER_SITE_OTHER): Payer: Self-pay | Admitting: *Deleted

## 2016-06-26 ENCOUNTER — Encounter (INDEPENDENT_AMBULATORY_CARE_PROVIDER_SITE_OTHER): Payer: Self-pay | Admitting: *Deleted

## 2016-06-26 DIAGNOSIS — K7031 Alcoholic cirrhosis of liver with ascites: Secondary | ICD-10-CM | POA: Insufficient documentation

## 2016-06-26 DIAGNOSIS — K7290 Hepatic failure, unspecified without coma: Secondary | ICD-10-CM | POA: Diagnosis not present

## 2016-06-26 DIAGNOSIS — K7682 Hepatic encephalopathy: Secondary | ICD-10-CM

## 2016-06-26 LAB — BASIC METABOLIC PANEL
Anion gap: 2 — ABNORMAL LOW (ref 5–15)
BUN: 32 mg/dL — ABNORMAL HIGH (ref 6–20)
CALCIUM: 7.4 mg/dL — AB (ref 8.9–10.3)
CO2: 22 mmol/L (ref 22–32)
CREATININE: 1.72 mg/dL — AB (ref 0.44–1.00)
Chloride: 111 mmol/L (ref 101–111)
GFR calc non Af Amer: 31 mL/min — ABNORMAL LOW (ref >60)
GFR, EST AFRICAN AMERICAN: 36 mL/min — AB (ref >60)
GLUCOSE: 93 mg/dL (ref 65–99)
Potassium: 4.4 mmol/L (ref 3.5–5.1)
Sodium: 135 mmol/L (ref 135–145)

## 2016-06-26 LAB — AMMONIA

## 2016-06-26 MED ORDER — SODIUM CHLORIDE 0.9 % IV SOLN
INTRAVENOUS | Status: DC
  Administered 2016-06-26: 250 mL via INTRAVENOUS

## 2016-06-26 MED ORDER — ALBUMIN HUMAN 25 % IV SOLN
50.0000 g | Freq: Once | INTRAVENOUS | Status: AC
  Administered 2016-06-26: 50 g via INTRAVENOUS
  Filled 2016-06-26: qty 50

## 2016-06-27 ENCOUNTER — Encounter (HOSPITAL_COMMUNITY)
Admission: RE | Admit: 2016-06-27 | Discharge: 2016-06-27 | Disposition: A | Discharge status: Home / Self Care | Payer: Commercial Managed Care - HMO | Source: Ambulatory Visit | Attending: Internal Medicine | Admitting: Internal Medicine

## 2016-06-27 DIAGNOSIS — K7031 Alcoholic cirrhosis of liver with ascites: Secondary | ICD-10-CM | POA: Diagnosis not present

## 2016-06-27 DIAGNOSIS — K7290 Hepatic failure, unspecified without coma: Secondary | ICD-10-CM | POA: Diagnosis not present

## 2016-06-27 MED ORDER — ALBUMIN HUMAN 25 % IV SOLN
50.0000 g | Freq: Once | INTRAVENOUS | Status: AC
  Administered 2016-06-27: 50 g via INTRAVENOUS
  Filled 2016-06-27: qty 200

## 2016-06-27 MED ORDER — SODIUM CHLORIDE 0.9 % IV SOLN
INTRAVENOUS | Status: DC
  Administered 2016-06-27: 250 mL via INTRAVENOUS

## 2016-06-27 NOTE — Progress Notes (Signed)
Results for AMBERLEY, HAMLER (MRN [id number]) as of 06/27/2016 11:27  Ref. Range 06/26/2016 13:00  Sodium Latest Ref Range: 135 - 145 mmol/L 135  Potassium Latest Ref Range: 3.5 - 5.1 mmol/L 4.4  Chloride Latest Ref Range: 101 - 111 mmol/L 111  CO2 Latest Ref Range: 22 - 32 mmol/L 22  BUN Latest Ref Range: 6 - 20 mg/dL 32 (H)  Creatinine Latest Ref Range: 0.44 - 1.00 mg/dL 1.72 (H)  Calcium Latest Ref Range: 8.9 - 10.3 mg/dL 7.4 (L)  EGFR (Non-African Amer.) Latest Ref Range: >60 mL/min 31 (L)  EGFR (African American) Latest Ref Range: >60 mL/min 36 (L)  Glucose Latest Ref Range: 65 - 99 mg/dL 93  Anion gap Latest Ref Range: 5 - 15  2 (L)

## 2016-06-28 ENCOUNTER — Encounter (HOSPITAL_COMMUNITY)
Admission: RE | Admit: 2016-06-28 | Discharge: 2016-06-28 | Disposition: A | Discharge status: Home / Self Care | Payer: Commercial Managed Care - HMO | Source: Ambulatory Visit | Attending: Internal Medicine | Admitting: Internal Medicine

## 2016-06-28 ENCOUNTER — Telehealth: Payer: Self-pay | Admitting: Adult Health

## 2016-06-28 DIAGNOSIS — K7290 Hepatic failure, unspecified without coma: Secondary | ICD-10-CM | POA: Diagnosis not present

## 2016-06-28 DIAGNOSIS — K7031 Alcoholic cirrhosis of liver with ascites: Secondary | ICD-10-CM | POA: Diagnosis not present

## 2016-06-28 MED ORDER — ALBUMIN HUMAN 25 % IV SOLN
12.5000 g | Freq: Once | INTRAVENOUS | Status: DC
  Filled 2016-06-28: qty 50

## 2016-06-28 MED ORDER — SODIUM CHLORIDE 0.9 % IV SOLN
INTRAVENOUS | Status: DC
  Administered 2016-06-28: 09:00:00 via INTRAVENOUS

## 2016-06-28 MED ORDER — FUROSEMIDE 10 MG/ML IJ SOLN
40.0000 mg | Freq: Once | INTRAMUSCULAR | Status: AC
  Administered 2016-06-28: 40 mg via INTRAVENOUS
  Filled 2016-06-28: qty 4

## 2016-06-28 MED ORDER — ALBUMIN HUMAN 25 % IV SOLN
50.0000 g | Freq: Once | INTRAVENOUS | Status: AC
  Administered 2016-06-28: 50 g via INTRAVENOUS
  Filled 2016-06-28: qty 200

## 2016-06-28 NOTE — Telephone Encounter (Signed)
Spoke with pt letting her know US showed thickened endometrium and that's the reason for the endo biopsy. Pt voiced understanding. [place]

## 2016-06-29 IMAGING — CT CT ABD-PELV W/ CM
2 of 4 series · 15 of 46 positions shown, 17 images · IV contrast (Omnipaque 300)
Comparison: 10/25/2013

CLINICAL DATA: Severe generalized abdominal pain. Hematemesis and
rectal bleeding. nausea and vomiting. Cirrhosis.

EXAM:
CT ABDOMEN AND PELVIS WITH CONTRAST
TECHNIQUE: Multidetector CT imaging of the abdomen and pelvis was performed
using the standard protocol following bolus administration of
intravenous contrast.
CONTRAST:  100mL OMNIPAQUE IOHEXOL 300 MG/ML SOLN, 25mL OMNIPAQUE
IOHEXOL 300 MG/ML SOLN

[Series 2: abd_pel_with 5.0 b40f · axial · 0.76mm/px · z∈[+373,+813]mm · 12 of 98 slices shown, 14 images]
[im 5/98  soft-tissue]
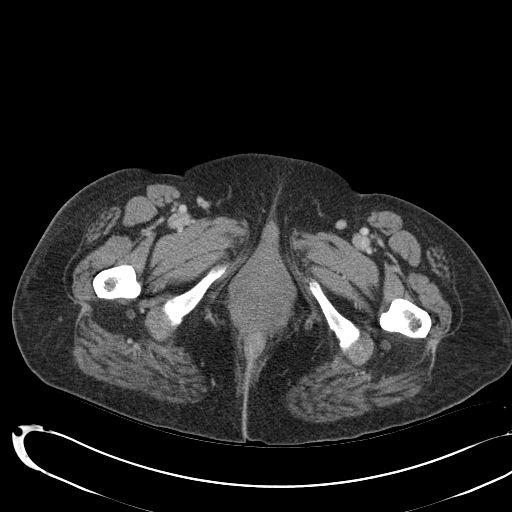
[im 5/98  bone]
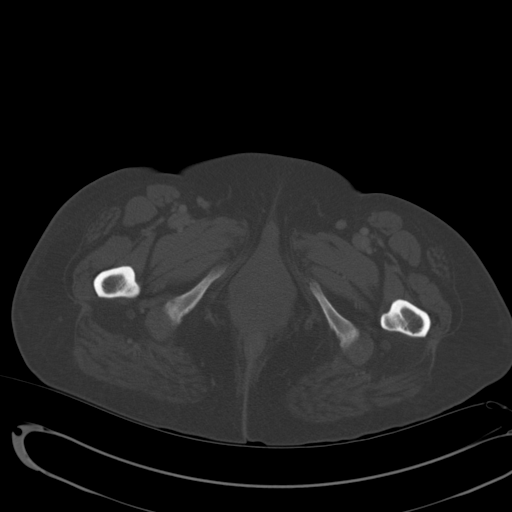
[im 14/98  soft-tissue]
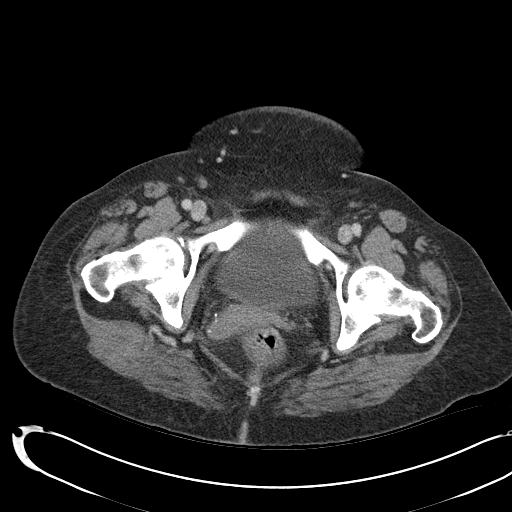
[im 24/98  soft-tissue]
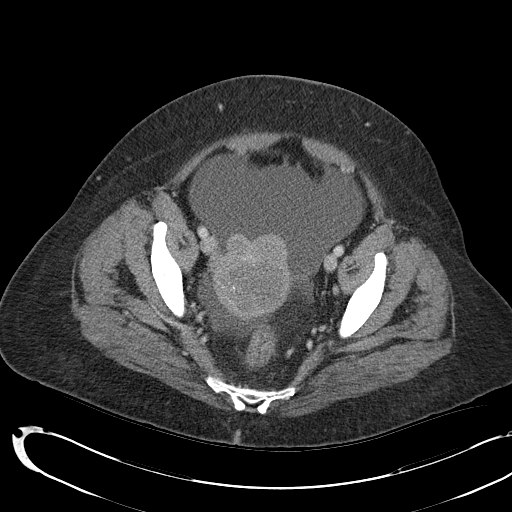
[im 28/98  soft-tissue]
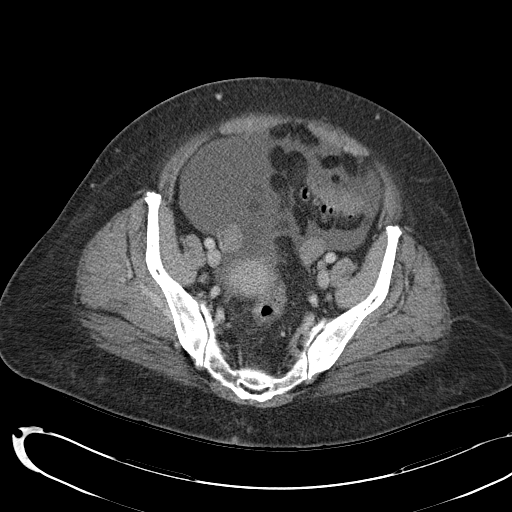
[im 37/98  soft-tissue]
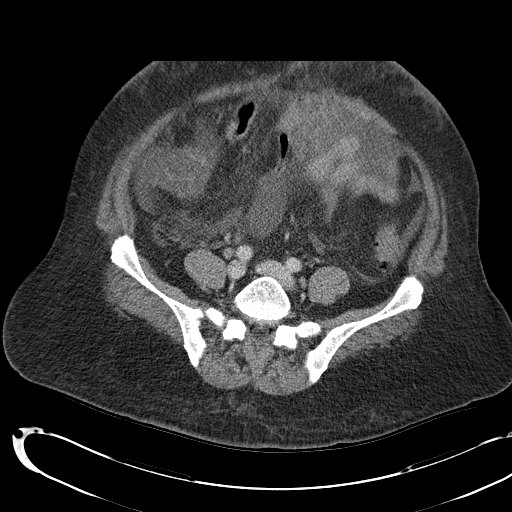
[im 47/98  soft-tissue]
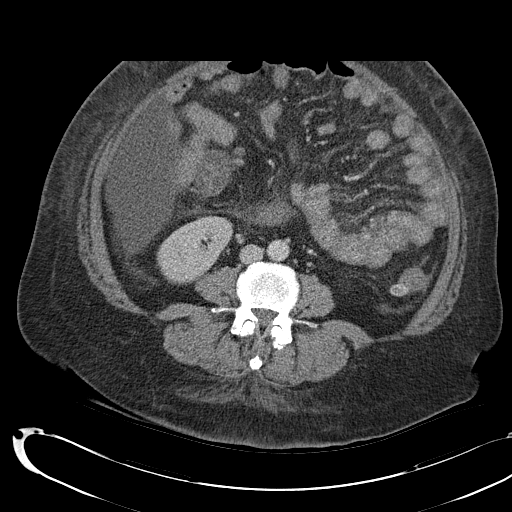
[im 51/98  soft-tissue]
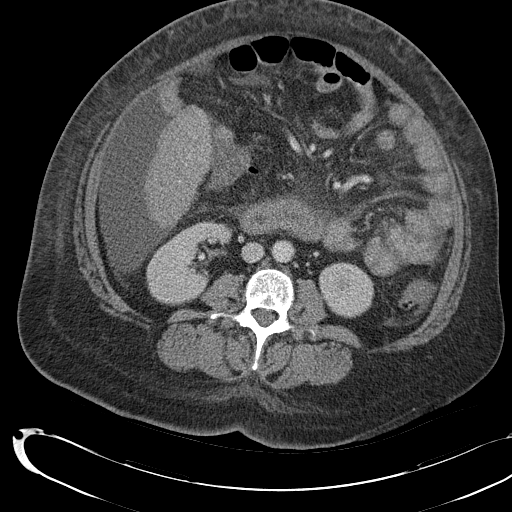
[im 61/98  soft-tissue]
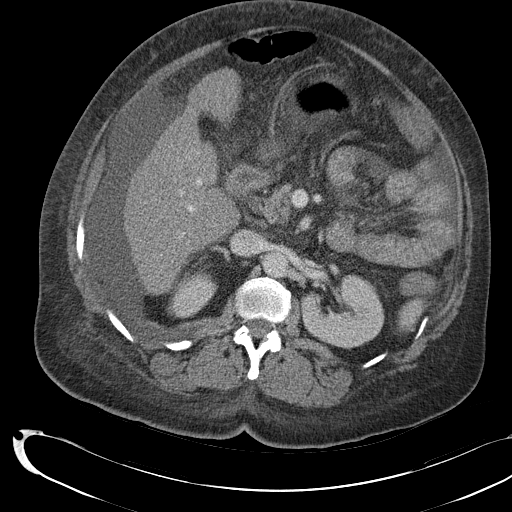
[im 70/98  soft-tissue]
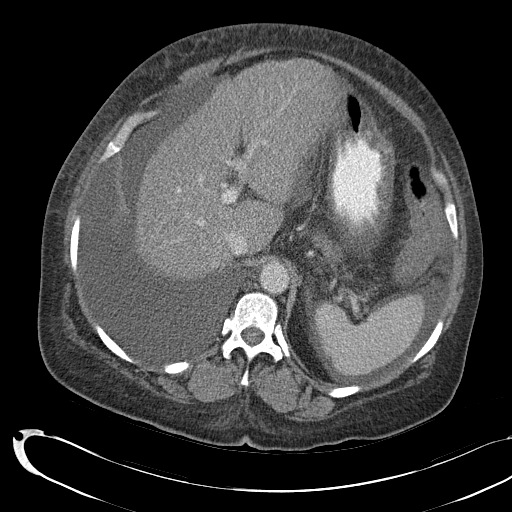
[im 70/98  bone]
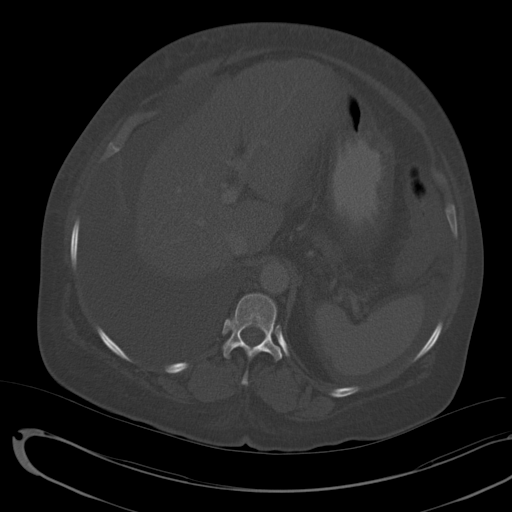
[im 74/98  soft-tissue]
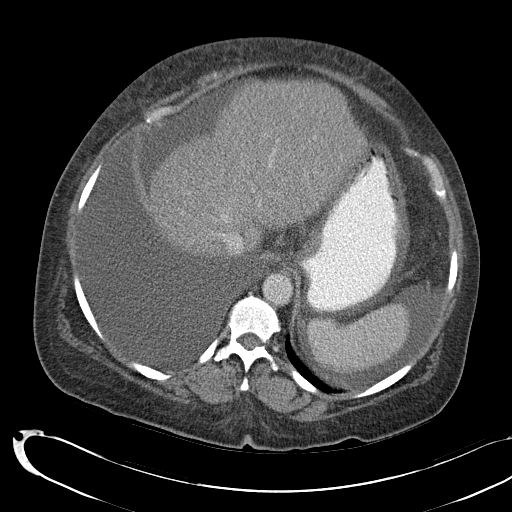
[im 84/98  soft-tissue]
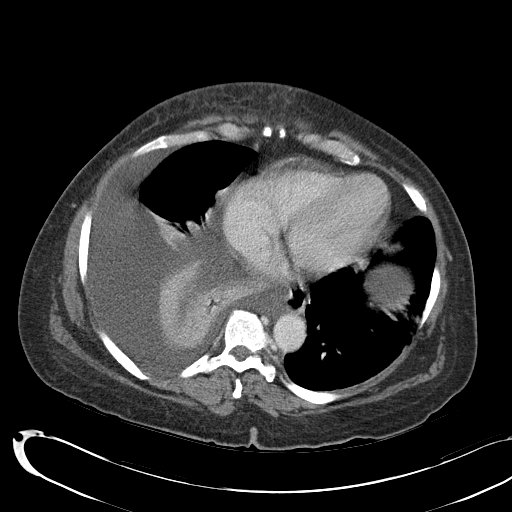
[im 93/98  soft-tissue]
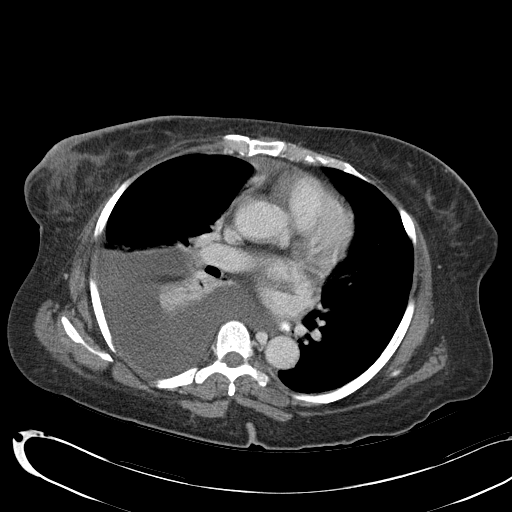

[Series 3: abd_pel_with 3.0 spo · coronal · 0.73mm/px · 3 of 103 slices shown]
[im 35/103  soft-tissue]
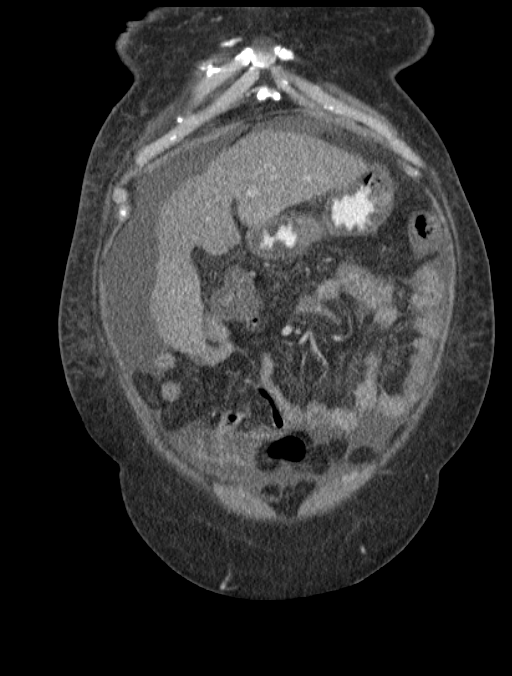
[im 46/103  soft-tissue]
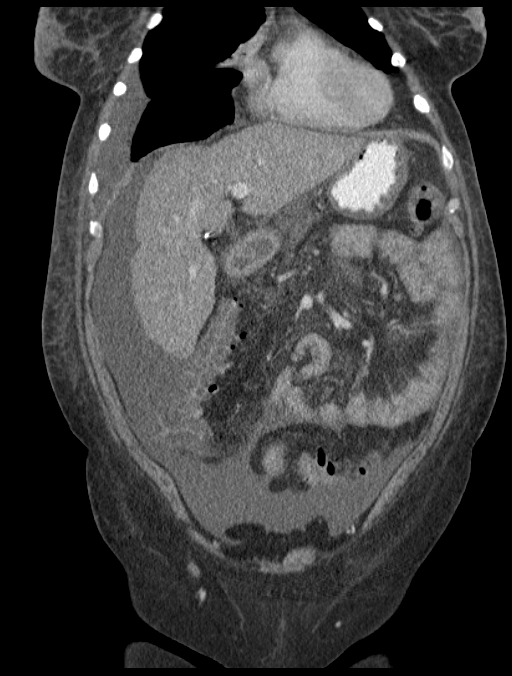
[im 57/103  soft-tissue]
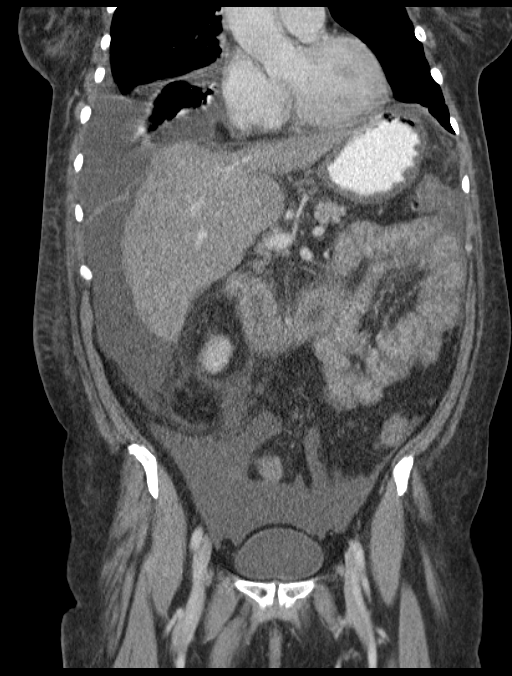

[15 of 46 positions shown; findings below may reference images not displayed]

FINDINGS: Lower Chest: New moderate to large right pleural effusion and right
lower lobe atelectasis demonstrated.

Hepatobiliary: Hepatic cirrhosis again demonstrated, however no
liver masses are identified. Prior cholecystectomy noted. No
evidence of biliary dilatation.

Pancreas: No mass, inflammatory changes, or other significant
abnormality identified.

Spleen:  Within normal limits in size and appearance.

Adrenals:  No masses identified.

Kidneys/Urinary Tract:  No evidence of masses or hydronephrosis.

Stomach/Bowel/Peritoneum: Moderate ascites as well as diffuse body
wall and mesenteric edema shows no significant interval change.
Diffuse colonic diverticulosis is seen, without evidence of focal
diverticulitis. There is mild diffuse small bowel and colonic wall
thickening, likely due to hypoalbuminemia in the setting of
cirrhosis. No evidence of dilated bowel loops or bowel obstruction.
No focal inflammatory process or abscess identified.

Vascular/Lymphatic: No pathologically enlarged lymph nodes
identified. No abdominal aortic aneurysm or other significant
retroperitoneal abnormality demonstrated. Portal and splenic veins
remain patent.

Reproductive: Multiple uterine fibroids again seen, largest
measuring approximately 4 cm. Adnexal regions are unremarkable.

Other:  None.

Musculoskeletal:  No suspicious bone lesions identified.
IMPRESSION: Hepatic cirrhosis with moderate ascites and diffuse mesenteric and
body wall edema, without significant change compared to prior exam.
No evidence of hepatic neoplasm.

Mild diffuse small bowel and colonic wall thickening, likely due to
hypoalbuminemia. Diverticulosis noted, without evidence of
diverticulitis or other focal inflammatory process.

New moderate to large right pleural effusion and right lower lung
atelectasis.

Uterine fibroids, largest measuring approximately 4 cm.

## 2016-07-04 ENCOUNTER — Ambulatory Visit
Admission: RE | Admit: 2016-07-04 | Discharge: 2016-07-04 | Disposition: A | Discharge status: Home / Self Care | Payer: Commercial Managed Care - HMO | Source: Ambulatory Visit | Attending: Interventional Radiology | Admitting: Interventional Radiology

## 2016-07-04 DIAGNOSIS — K7031 Alcoholic cirrhosis of liver with ascites: Secondary | ICD-10-CM

## 2016-07-04 DIAGNOSIS — Z95828 Presence of other vascular implants and grafts: Secondary | ICD-10-CM

## 2016-07-04 DIAGNOSIS — K769 Liver disease, unspecified: Principal | ICD-10-CM

## 2016-07-04 DIAGNOSIS — R188 Other ascites: Secondary | ICD-10-CM | POA: Diagnosis not present

## 2016-07-04 DIAGNOSIS — J918 Pleural effusion in other conditions classified elsewhere: Secondary | ICD-10-CM

## 2016-07-04 DIAGNOSIS — J90 Pleural effusion, not elsewhere classified: Secondary | ICD-10-CM

## 2016-07-04 DIAGNOSIS — J948 Other specified pleural conditions: Secondary | ICD-10-CM | POA: Diagnosis not present

## 2016-07-04 DIAGNOSIS — K7460 Unspecified cirrhosis of liver: Secondary | ICD-10-CM | POA: Diagnosis not present

## 2016-07-04 HISTORY — PX: IR GENERIC HISTORICAL: IMG1180011

## 2016-07-04 NOTE — Progress Notes (Signed)
Chief Complaint: Patient was seen in follow-up today for TIPS at the request of [facility]  Referring Physician(s): Kayzen Kendzierski  History of Present Illness: Savannah Jackson is a 60 y.o. 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 Following failure of maximal medical therapy and frequent paracentesis and thoracentesis, she ultimately underwent an elective TIPS procedure on 07/16/2015.  She has done extremely well regarding her fluid buildup since the time of that procedure. She has required no additional paracentesis or thoracentesis. She has a small residual right sided pleural effusion which is asymptomatic. She denies shortness of breath, cough, or fatigue.  Her TIPS procedure was complicated by development of hepatic encephalopathy which has been well managed on both rifaximin and lactulose. She does complain that she is going to the bathroom 4 or 5 times daily secondary to the lactulose which interferes with her activities of daily living.  She is currently taking lactulose 4 times daily.  She had another episode of significant bilateral lower extremity swelling in the setting of renal failure this past June requiring hospital admission.  Cardiac causes were ruled out. Her edema is thought secondary to acute on chronic renal failure. Her renal failure has since improved but she is no longer able to take diuretics and therefore her lower extremity edema persists.   She has been evaluated for liver transplant at [facility].   Past Medical History:  Diagnosis Date  . Bilateral leg edema   . Brain aneurysm   . Breast disorder    right breast cancer 2002  . Cancer [facility])    breast cancer  . Chronic back pain Diverticultis  . Cirrhosis ([place])    alcoholic  . DDD (degenerative disc disease), lumbar   . Diverticulosis    scope 2014  . ETOH abuse   . Hepatomegaly    scope  2014  . History of breast cancer 06/07/2016  . Hypertension   . Rotator cuff syndrome of left shoulder   . Sleep apnea   . Thickened endometrium 06/19/2016   Will get endo biopsy     Past Surgical History:  Procedure Laterality Date  . BRAIN SURGERY     aneurysm at [place]  . CATARACT EXTRACTION Bilateral    [facility] 2 or 3 years ago  . CHOLECYSTECTOMY    . COLONOSCOPY N/A 04/10/2013   Procedure: COLONOSCOPY;  Surgeon: Rogene Houston, MD;  [facility];  Service: Endoscopy;  Laterality: N/A;  1030-rescheduled to 1200 Ann notified pt  . ESOPHAGOGASTRODUODENOSCOPY N/A 04/22/2015   Procedure: ESOPHAGOGASTRODUODENOSCOPY (EGD);  Surgeon: Rogene Houston, MD;  [facility];  Service: Endoscopy;  Laterality: N/A;  . RADIOLOGY WITH ANESTHESIA N/A 07/16/2015   Procedure: TIPS;  Surgeon: Medication Radiologist, MD;  Location: [place];  Service: Radiology;  Laterality: N/A;  . TOTAL KNEE ARTHROPLASTY     right. 2002    Allergies: Latex; Tylenol [acetaminophen]; and Vicodin [hydrocodone-acetaminophen]  Medications: Prior to Admission medications   Medication Sig Start Date End Date Taking? Authorizing Provider  acetaminophen (TYLENOL) 325 MG tablet Take 325 mg by mouth 2 (two) times daily as needed for moderate pain.   Yes Historical Provider, MD  folic acid (FOLVITE) 1 MG tablet Take 1 tablet (1 mg total) by mouth daily. 04/29/15  Yes Rosita Fire, MD  lactulose (CHRONULAC) 10 GM/15ML solution Take 22.5 mLs (15 g total)  by mouth 4 (four) times daily. Four times a day 02/29/16  Yes Rogene Houston, MD  PRESCRIPTION MEDICATION Inject 20 mcg into the muscle. Ths was a second injection of the Hep B vaccines - Engerix - B   Yes Historical Provider, MD  propranolol (INDERAL) 10 MG tablet Take 10 mg by mouth 2 (two) times daily.   Yes Historical Provider, MD  rifaximin (XIFAXAN) 550 MG TABS tablet Take 550 mg by mouth 2 (two) times daily.   Yes Jacqulynn Cadet, MD  thiamine 100 MG tablet Take 1 tablet (100 mg total) by mouth daily. 04/29/15  Yes Rosita Fire, MD  torsemide (DEMADEX) 20 MG tablet Take 30 mg by mouth daily.    Yes Historical Provider, MD  spironolactone (ALDACTONE) 25 MG tablet Take 1 tablet (25 mg total) by mouth daily. 04/25/16   Rosita Fire, MD     Family History  Problem Relation Age of Onset  . Aneurysm Mother   . Other Daughter     knee replacement  . Hypertension Daughter     Social History   Social History  . Marital status: Married    Spouse name: N/A  . Number of children: N/A  . Years of education: N/A   Social History Main Topics  . Smoking status: Never Smoker  . Smokeless tobacco: Never Used  . Alcohol use No     Comment: none since 03/2015  . Drug use: No  . Sexual activity: Not Currently    Birth control/ protection: Post-menopausal   Other Topics Concern  . 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 However he is fairly capable of taking care of himself at home. This couple have an attentive daughter who keeps an eye on both of them.    Review of Systems: A 12 point ROS discussed and pertinent positives are indicated in the HPI above.  All other systems are negative.  Review of Systems  Vital Signs: BP (!) 173/88 (BP Location: Left Arm, Patient Position: Sitting, Cuff Size: Normal)   Pulse 96   Temp 98 F (36.7 C) (Oral)   Resp 15   Ht 5\' 3"  (1.6 m)   Wt 204 lb 9.6 oz (92.8 kg)   SpO2 100%   BMI 36.24 kg/m   Physical Exam  Constitutional: She is oriented to person, place, and time. She appears well-developed and well-nourished.  HENT:  Head: Normocephalic and atraumatic.  Eyes: No scleral icterus.  Cardiovascular: Normal rate.   Pulmonary/Chest: Effort normal.  Abdominal: Soft. She exhibits no distension. There is no tenderness.  Musculoskeletal:       Right knee: She exhibits  swelling.       Left knee: She exhibits swelling.       Right ankle: She exhibits swelling.       Left ankle: She exhibits swelling.  2+ edema from knees down.   Neurological: She is alert and oriented to person, place, and time.  Skin: Skin is warm and dry.  Psychiatric: She has a normal mood and affect. Her behavior is normal.  Nursing note and vitals reviewed.   Imaging: US Transvaginal Non-ob  Result Date: 06/26/2016 GYNECOLOGIC SONOGRAM KONDA MORITA is a 60 y.o. G1P1001  for a pelvic sonogram for postmenopausal bleeding. Uterus  11.9 x 5.7 x 5.1 cm,  Heterogenous anteverted uterus with an anterior left intramural fibroid 3.9 x 3.5 x 3.8 cm Endometrium          22 mm, symmetrical, thickened endometrium w/a 4.3 mm calcification w/in the endometrium Right ovary             2.3 x 1.9 x 1.5 cm, wnl Left ovary                2.2 x 1.9 x 1.1 cm, wnl limited view No free fluid,no pain during ultrasound Technician Comments: PELVIC US TA/TV: Heterogenous anteverted uterus with an anterior left intramural fibroid 3.9 x 3.5 x 3.8 cm,thickened endometrium w/a 4.3 mm calcification w/in the endometrium,EEC 22 mm,normal ov's bilat,limited view of left ovary,no free fluid,no pain during ultrasound [employer] 06/16/2016 12:58 PM Clinical Impression and recommendations: I have reviewed the sonogram results above. Combined with the patient's current clinical course, below are my impressions and any appropriate recommendations for management based on the sonographic findings: 1. Moderately enlarged uterus, 183 g estimated, with small clinically insignificant intramural fibroid 2. THICKENED endometrium 2.2 cm, distinctly abnormal with a small 4 mm calcification in the center of the endometrial tissue. Endometrial biopsy indicated 3. Normal right and left ovary FERGUSON,JOHN V   US Pelvis Complete  Result Date: 06/26/2016 GYNECOLOGIC SONOGRAM MAIARA GRANER is a 60 y.o. G1P1001  for a  pelvic sonogram for postmenopausal bleeding. Uterus                      11.9 x 5.7 x 5.1 cm,  Heterogenous anteverted uterus with an anterior left intramural fibroid 3.9 x 3.5 x 3.8 cm Endometrium          22 mm, symmetrical, thickened endometrium w/a 4.3 mm calcification w/in the endometrium Right ovary             2.3 x 1.9 x 1.5 cm, wnl Left ovary                2.2 x 1.9 x 1.1 cm, wnl limited view No free fluid,no pain during ultrasound Technician Comments: PELVIC US TA/TV: Heterogenous anteverted uterus with an anterior left intramural fibroid 3.9 x 3.5 x 3.8 cm,thickened endometrium w/a 4.3 mm calcification w/in the endometrium,EEC 22 mm,normal ov's bilat,limited view of left ovary,no free fluid,no pain during ultrasound [employer] 06/16/2016 12:58 PM Clinical Impression and recommendations: I have reviewed the sonogram results above. Combined with the patient's current clinical course, below are my impressions and any appropriate recommendations for management based on the sonographic findings: 1. Moderately enlarged uterus, 183 g estimated, with small clinically insignificant intramural fibroid 2. THICKENED endometrium 2.2 cm, distinctly abnormal with a small 4 mm calcification in the center of the endometrial tissue. Endometrial biopsy indicated 3. Normal right and left ovary FERGUSON,JOHN V   [place] Art/ven Flow Abd Pelv Doppler  Result Date: 07/04/2016 CLINICAL DATA:  60 year old female with a history of alcoholic cirrhosis complicated by recurrent large volume ascites and right-sided hepatic hydrothorax. Patient underwent placement of a TIPS on 07/16/2015. She presents today for 1 year follow-up evaluation. EXAM: DUPLEX ULTRASOUND OF LIVER AND TIPS SHUNT TECHNIQUE: Color and duplex Doppler ultrasound was performed to evaluate the hepatic in-flow and out-flow vessels. COMPARISON:  Most recent prior TIPS ultrasound 11/29/2015 FINDINGS: Portal Vein Velocities Main:  21 cm/sec Right:  69 cm/sec Left:  15  cm/sec TIPS Stent Velocities Proximal:  69 cm/sec MID:  105  cm/sec Distal:  65 cm/sec Hepatic Vein Velocities Right:  65 cm/sec MID:  19 cm/sec Left:  23 cm/sec Splenic Vein: 45 cm/sec Superior Mesenteric Vein: Not visualized Hepatic Artery: 113 centimeters/second Ascites: None visualized. Varices: No varices visualized. Other: Small right pleural effusion. Heterogeneous liver with increased echogenicity and coarsening in the hepatic parenchyma in a nodular contour consistent with underlying cirrhosis. IMPRESSION: 1. Widely patent right hepatic to right portal venous TIPS. 2. Small right pleural effusion. 3. No evidence of ascites. 4. Morphologic changes of cirrhosis. Signed, Criselda Peaches, MD [facility] Radiology Electronically Signed   By: Jacqulynn Cadet M.D.   On: 07/04/2016 10:35    Labs:  CBC:  Recent Labs  11/27/15 0639  12/01/15 0644 12/14/15 1218 04/18/16 2113 04/19/16 0426  WBC 6.3  --   --  6.6 7.0 8.2  HGB 8.5*  < > 9.1* 10.7* 10.7* 11.0*  HCT 25.3*  < > 27.0* 31.9* 31.2* 33.0*  PLT 154  --   --  114* 129* 138*  < > = values in this interval not displayed.  COAGS:  Recent Labs  11/26/15 1016 11/30/15 0520 12/14/15 1218 04/18/16 2113  INR 1.45 1.70* 1.30 1.38    BMP:  Recent Labs  04/23/16 0552 04/24/16 0819 04/25/16 0459 04/26/16 1138 06/21/16 1059 06/26/16 1300  NA 137 133* 131* 137 139 135  K 5.1 4.9 4.7 5.0 4.9 4.4  CL 111 106 105 106 108 111  CO2 22 23 23 22 25 22   GLUCOSE 97 95 93 87 85 93  BUN 53* 54* 57* 49* 36* 32*  CALCIUM 8.0* 7.8* 7.3* 7.6* 7.7* 7.4*  CREATININE 1.96* 1.95* 2.06* 1.81* 1.53* 1.72*  GFRNONAA 27* 27* 25*  --   --  31*  GFRAA 31* 31* 29*  --   --  36*    LIVER FUNCTION TESTS:  Recent Labs  04/22/16 0559 04/24/16 0819 04/26/16 1138 06/21/16 1059  BILITOT 4.0* 3.4* 1.4* 1.2  AST 43* 44* 72* 54*  ALT 12* 13* 18 13  ALKPHOS 88 89 138* 154*  PROT 5.4* 4.7* 4.2*  4.0*  ALBUMIN 3.5 2.6* 2.0* <1.5*    TUMOR MARKERS:  Recent Labs  11/30/15 0848  AFPTM 3.0    Assessment and Plan:  TIPS is widely patent and without evidence of complication. Hepatic hydrothorax and ascites remain resolved.  Hepatic encephalopathy currently well treated on lactulose and rifaximin.  Primary clinical issue is chronic renal insufficiency and bilateral lower extremity edema.  1.)  Issued a prescription for 15-20 mmHg thigh-high compression hose in an effort to combat lower extremity edema. 2.) Return to interventional radiology in 1 year with repeat TIPS Doppler ultrasound.    Electronically Signed: Jacqulynn Cadet 07/04/2016, 11:36 AM   I spent a total of  15 Minutes in faceto face in clinical consultation, greater than 50% of which was counseling/coordinating care for transjugular intrahepatic portosystemic shunt follow-up.

## 2016-07-06 ENCOUNTER — Other Ambulatory Visit: Payer: Self-pay | Admitting: Obstetrics & Gynecology

## 2016-07-06 ENCOUNTER — Encounter: Payer: Self-pay | Admitting: Obstetrics & Gynecology

## 2016-07-06 ENCOUNTER — Ambulatory Visit (INDEPENDENT_AMBULATORY_CARE_PROVIDER_SITE_OTHER): Payer: Commercial Managed Care - HMO | Admitting: Obstetrics & Gynecology

## 2016-07-06 VITALS — BP 120/80 | HR 78 | Ht 63.0 in | Wt 206.0 lb

## 2016-07-06 DIAGNOSIS — R938 Abnormal findings on diagnostic imaging of other specified body structures: Secondary | ICD-10-CM

## 2016-07-06 DIAGNOSIS — N840 Polyp of corpus uteri: Secondary | ICD-10-CM | POA: Diagnosis not present

## 2016-07-06 DIAGNOSIS — N950 Postmenopausal bleeding: Secondary | ICD-10-CM

## 2016-07-06 DIAGNOSIS — K7031 Alcoholic cirrhosis of liver with ascites: Secondary | ICD-10-CM | POA: Diagnosis not present

## 2016-07-06 DIAGNOSIS — K7290 Hepatic failure, unspecified without coma: Secondary | ICD-10-CM | POA: Diagnosis not present

## 2016-07-06 DIAGNOSIS — R9389 Abnormal findings on diagnostic imaging of other specified body structures: Secondary | ICD-10-CM

## 2016-07-06 LAB — COMPREHENSIVE METABOLIC PANEL
ALBUMIN: 1.6 g/dL — AB (ref 3.6–5.1)
ALK PHOS: 178 U/L — AB (ref 33–130)
ALT: 11 U/L (ref 6–29)
AST: 38 U/L — ABNORMAL HIGH (ref 10–35)
BUN: 31 mg/dL — AB (ref 7–25)
CO2: 23 mmol/L (ref 20–31)
CREATININE: 1.72 mg/dL — AB (ref 0.50–0.99)
Calcium: 7.4 mg/dL — ABNORMAL LOW (ref 8.6–10.4)
Chloride: 110 mmol/L (ref 98–110)
Glucose, Bld: 93 mg/dL (ref 65–99)
POTASSIUM: 4.7 mmol/L (ref 3.5–5.3)
Sodium: 140 mmol/L (ref 135–146)
TOTAL PROTEIN: 3.9 g/dL — AB (ref 6.1–8.1)
Total Bilirubin: 0.9 mg/dL (ref 0.2–1.2)

## 2016-07-06 NOTE — Progress Notes (Signed)
Endometrial Biopsy Procedure Note  Pre-operative Diagnosis: Post menopausal bleeding, thickened endometrium 22 mm  Post-operative Diagnosis: same  Indications: postmenopausal bleeding  Procedure Details   Urine pregnancy test was not done.  The risks (including infection, bleeding, pain, and uterine perforation) and benefits of the procedure were explained to the patient and Written informed consent was obtained.  Antibiotic prophylaxis against endocarditis was not indicated.   The patient was placed in the dorsal lithotomy position.  Bimanual exam showed the uterus to be in the neutral position.  A Graves' speculum inserted in the vagina, and the cervix prepped with povidone iodine.  Endocervical curettage with a Kevorkian curette was not performed.   A sharp tenaculum was applied to the anterior lip of the cervix for stabilization.  A sterile uterine sound was used to sound the uterus to a depth of 6cm.  A Mylex 59mm curette was used to sample the endometrium.  Sample was sent for pathologic examination.  Condition: Stable  Complications: None  Plan:  The patient was advised to call for any fever or for prolonged or severe pain or bleeding. She was advised to use OTC analgesics as needed for mild to moderate pain. She was advised to avoid vaginal intercourse for 48 hours or until the bleeding has completely stopped.  Attending Physician Documentation: I was present for or performed the following: endometrial biopsy

## 2016-07-06 NOTE — Addendum Note (Signed)
Addended by: Diona Fanti A on: 07/06/2016 12:43 PM   Modules accepted: Orders

## 2016-07-10 NOTE — Progress Notes (Signed)
TIPS is patent.

## 2016-07-13 ENCOUNTER — Telehealth: Payer: Self-pay | Admitting: Obstetrics & Gynecology

## 2016-07-13 NOTE — Telephone Encounter (Signed)
Pt requesting results of endometrial biopsy. Please advise.

## 2016-07-13 NOTE — Telephone Encounter (Signed)
She has benign polyps on biopsy If she continues to have bleeding we will need to do a hysteroscopy uterine curettage(D&C) in the OR to remove them

## 2016-07-14 NOTE — Telephone Encounter (Signed)
Pt informed benign polyps on biopsy, if bleeding persists will need to do Hysteroscopy uterine curettage in OR per Dr. Elonda Husky. Pt verbalized understanding.

## 2016-08-02 ENCOUNTER — Encounter (INDEPENDENT_AMBULATORY_CARE_PROVIDER_SITE_OTHER): Payer: Self-pay | Admitting: Internal Medicine

## 2016-08-02 ENCOUNTER — Ambulatory Visit (INDEPENDENT_AMBULATORY_CARE_PROVIDER_SITE_OTHER): Payer: Commercial Managed Care - HMO | Admitting: Internal Medicine

## 2016-08-02 VITALS — BP 108/68 | HR 66 | Temp 97.7°F | Resp 16 | Ht 63.0 in | Wt 209.6 lb

## 2016-08-02 DIAGNOSIS — Z23 Encounter for immunization: Secondary | ICD-10-CM | POA: Diagnosis not present

## 2016-08-02 DIAGNOSIS — F1021 Alcohol dependence, in remission: Secondary | ICD-10-CM | POA: Diagnosis not present

## 2016-08-02 DIAGNOSIS — K7469 Other cirrhosis of liver: Secondary | ICD-10-CM

## 2016-08-02 DIAGNOSIS — K7031 Alcoholic cirrhosis of liver with ascites: Secondary | ICD-10-CM | POA: Diagnosis not present

## 2016-08-02 DIAGNOSIS — I10 Essential (primary) hypertension: Secondary | ICD-10-CM | POA: Diagnosis not present

## 2016-08-02 DIAGNOSIS — N183 Chronic kidney disease, stage 3 (moderate): Secondary | ICD-10-CM | POA: Diagnosis not present

## 2016-08-02 LAB — COMPREHENSIVE METABOLIC PANEL
ALK PHOS: 172 U/L — AB (ref 33–130)
ALT: 12 U/L (ref 6–29)
AST: 46 U/L — ABNORMAL HIGH (ref 10–35)
BILIRUBIN TOTAL: 0.9 mg/dL (ref 0.2–1.2)
BUN: 33 mg/dL — AB (ref 7–25)
CHLORIDE: 114 mmol/L — AB (ref 98–110)
CO2: 24 mmol/L (ref 20–31)
CREATININE: 1.8 mg/dL — AB (ref 0.50–0.99)
Calcium: 7.7 mg/dL — ABNORMAL LOW (ref 8.6–10.4)
Glucose, Bld: 91 mg/dL (ref 65–99)
Potassium: 4.5 mmol/L (ref 3.5–5.3)
SODIUM: 141 mmol/L (ref 135–146)
TOTAL PROTEIN: 3.8 g/dL — AB (ref 6.1–8.1)

## 2016-08-02 LAB — CBC WITH DIFFERENTIAL/PLATELET
BASOS PCT: 3 %
Basophils Absolute: 198 cells/uL (ref 0–200)
EOS PCT: 14 %
Eosinophils Absolute: 924 cells/uL — ABNORMAL HIGH (ref 15–500)
HEMATOCRIT: 31.3 % — AB (ref 35.0–45.0)
Hemoglobin: 10.1 g/dL — ABNORMAL LOW (ref 11.7–15.5)
LYMPHS PCT: 35 %
Lymphs Abs: 2310 cells/uL (ref 850–3900)
MCH: 26.5 pg — ABNORMAL LOW (ref 27.0–33.0)
MCHC: 32.3 g/dL (ref 32.0–36.0)
MCV: 82.2 fL (ref 80.0–100.0)
MONOS PCT: 12 %
MPV: 10.1 fL (ref 7.5–12.5)
Monocytes Absolute: 792 cells/uL (ref 200–950)
NEUTROS PCT: 36 %
Neutro Abs: 2376 cells/uL (ref 1500–7800)
PLATELETS: 140 10*3/uL (ref 140–400)
RBC: 3.81 MIL/uL (ref 3.80–5.10)
RDW: 15.4 % — AB (ref 11.0–15.0)
WBC: 6.6 10*3/uL (ref 3.8–10.8)

## 2016-08-02 LAB — AMMONIA: AMMONIA: 80 umol/L — AB (ref <=47)

## 2016-08-02 NOTE — Progress Notes (Signed)
 Subjective:    Patient ID: Savannah Jackson, female    DOB: 05/12/1956, 59 y.o.   MRN: [id number]  HPI Here today for f/u of her decompensated liver disease and Hepatic encephalopathy.  Today her weight is 209.6. Weight in August was 206.4.  She tells me she is okay. She feels tired.  She is eating maybe one good meal in a day.  No periods of confusion.  She takes the Lactulose twice a day. She has a BM 4-5 times a day.  There is no abdominal pain.  She says she is SOB. She has appt 08/10/2016 at [place].  Has started the Hepatitis B vaccine x 2.  She is drinking about 16 oz a day (fluid) She received albumin 50gm IV x 3 days for albumin of 1.6.    07/04/2016 US art/Ven Flow Abd Pelv Doppler Limited   IMPRESSION: 1. Widely patent right hepatic to right portal venous TIPS. 2. Small right pleural effusion. 3. No evidence of ascites. 4. Morphologic changes of cirrhosis.  Hepatic Function Latest Ref Rng & Units 07/06/2016 06/21/2016 04/26/2016  Total Protein 6.1 - 8.1 g/dL 3.9(L) 4.0(L) 4.2(L)  Albumin 3.6 - 5.1 g/dL 1.6(L) <1.5(L) 2.0(L)  AST 10 - 35 U/L 38(H) 54(H) 72(H)  ALT 6 - 29 U/L 11 13 18  Alk Phosphatase 33 - 130 U/L 178(H) 154(H) 138(H)  Total Bilirubin 0.2 - 1.2 mg/dL 0.9 1.2 1.4(H)  Bilirubin, Direct <=0.2 mg/dL - - -   .06/22/2015 Ammonia 65.    CMP     Component Value Date/Time   NA 140 07/06/2016 1300   K 4.7 07/06/2016 1300   CL 110 07/06/2016 1300   CO2 23 07/06/2016 1300   GLUCOSE 93 07/06/2016 1300   BUN 31 (H) 07/06/2016 1300   CREATININE 1.72 (H) 07/06/2016 1300   CALCIUM 7.4 (L) 07/06/2016 1300   PROT 3.9 (L) 07/06/2016 1300   ALBUMIN 1.6 (L) 07/06/2016 1300   AST 38 (H) 07/06/2016 1300   ALT 11 07/06/2016 1300   ALKPHOS 178 (H) 07/06/2016 1300   BILITOT 0.9 07/06/2016 1300   GFRNONAA 31 (L) 06/26/2016 1300   GFRNONAA 58 (L) 08/03/2015 1054   GFRAA 36 (L) 06/26/2016 1300   GFRAA 67 08/03/2015 1054     Review of Systems Past Medical  History:  Diagnosis Date  . Bilateral leg edema   . Brain aneurysm   . Breast disorder    right breast cancer 2002  . Cancer ([facility])    breast cancer  . Chronic back pain Diverticultis  . Cirrhosis ([facility])    alcoholic  . DDD (degenerative disc disease), lumbar   . Diverticulosis    scope 2014  . ETOH abuse   . Hepatomegaly    scope 2014  . History of breast cancer 06/07/2016  . Hypertension   . Rotator cuff syndrome of left shoulder   . Sleep apnea   . Thickened endometrium 06/19/2016   Will get endo biopsy     Past Surgical History:  Procedure Laterality Date  . BRAIN SURGERY     aneurysm at [facility]  . BREAST SURGERY     cancer  . CATARACT EXTRACTION Bilateral    [facility] 2 or 3 years ago  . CHOLECYSTECTOMY    . COLONOSCOPY N/A 04/10/2013   Procedure: COLONOSCOPY;  Surgeon: Najeeb U Rehman, MD;  [facility];  Service: Endoscopy;  Laterality: N/A;  1030-rescheduled to 1200 Ann notified pt  . ESOPHAGOGASTRODUODENOSCOPY N/A 04/22/2015     Procedure: ESOPHAGOGASTRODUODENOSCOPY (EGD);  Surgeon: Najeeb U Rehman, MD;  [facility];  Service: Endoscopy;  Laterality: N/A;  . RADIOLOGY WITH ANESTHESIA N/A 07/16/2015   Procedure: TIPS;  Surgeon: Medication Radiologist, MD;  Location: [facility];  Service: Radiology;  Laterality: N/A;  . TOTAL KNEE ARTHROPLASTY     right. 2002    Allergies  Allergen Reactions  . Latex Swelling    Discoloration of skin.   . Tylenol [Acetaminophen] Palpitations    Says doctor told her not to take   . Vicodin [Hydrocodone-Acetaminophen] Itching    Current Outpatient Prescriptions on File Prior to Visit  Medication Sig Dispense Refill  . acetaminophen (TYLENOL) 325 MG tablet Take 325 mg by mouth 2 (two) times daily as needed for moderate pain.    . folic acid (FOLVITE) 1 MG tablet Take 1 tablet (1 mg total) by mouth daily. 30 tablet 3  . lactulose (CHRONULAC) 10 GM/15ML solution Take 22.5 mLs (15 g total) by mouth 4 (four) times daily.  Four times a day 2700 mL 5  . PRESCRIPTION MEDICATION Inject 20 mcg into the muscle. Ths was a second injection of the Hep B vaccines - Engerix - B    . propranolol (INDERAL) 10 MG tablet Take 10 mg by mouth 2 (two) times daily.    . rifaximin (XIFAXAN) 550 MG TABS tablet Take 550 mg by mouth 2 (two) times daily.    . spironolactone (ALDACTONE) 25 MG tablet Take 1 tablet (25 mg total) by mouth daily. 30 tablet 3  . thiamine 100 MG tablet Take 1 tablet (100 mg total) by mouth daily. 30 tablet 3  . torsemide (DEMADEX) 20 MG tablet Take 30 mg by mouth daily.      No current facility-administered medications on file prior to visit.        Objective:   Physical Exam Blood pressure 108/68, pulse 66, temperature 97.7 F (36.5 C), temperature source Oral, resp. rate 16, height 5' 3" (1.6 m), weight 209 lb 9.6 oz (95.1 kg). Alert and oriented. Skin warm and dry. Oral mucosa is moist.   . Sclera anicteric, conjunctivae is pink. Thyroid not enlarged. No cervical lymphadenopathy. Lungs clear. Heart regular rate and rhythm.  Abdomen is soft. Bowel sounds are positive. No hepatomegaly. No abdominal masses felt. No tenderness.  3+ edema to lower extremities.  1+ edema to thighs.         Assessment & Plan:  Decompensated alcoholic liver disease. Slight weight gain. No periods of confusion.  Will get a CBC, Ammonia, and CMET. OV in 8 weeks with Dr Rehman.  

## 2016-08-02 NOTE — Patient Instructions (Addendum)
OV in 8 weeks with Dr. Laural Golden.  Labs today

## 2016-08-03 ENCOUNTER — Encounter (HOSPITAL_COMMUNITY)
Admission: RE | Admit: 2016-08-03 | Discharge: 2016-08-03 | Disposition: A | Discharge status: Home / Self Care | Payer: Commercial Managed Care - HMO | Source: Ambulatory Visit | Attending: Internal Medicine | Admitting: Internal Medicine

## 2016-08-03 ENCOUNTER — Encounter (HOSPITAL_COMMUNITY): Payer: Self-pay

## 2016-08-03 DIAGNOSIS — K7031 Alcoholic cirrhosis of liver with ascites: Secondary | ICD-10-CM | POA: Insufficient documentation

## 2016-08-03 DIAGNOSIS — K7290 Hepatic failure, unspecified without coma: Secondary | ICD-10-CM

## 2016-08-03 MED ORDER — ALBUMIN HUMAN 25 % IV SOLN
50.0000 g | Freq: Once | INTRAVENOUS | Status: DC
  Filled 2016-08-03: qty 200

## 2016-08-03 MED ORDER — SODIUM CHLORIDE 0.9 % IV SOLN: Freq: Once | INTRAVENOUS | Status: DC

## 2016-08-04 ENCOUNTER — Encounter (HOSPITAL_COMMUNITY)
Admission: RE | Admit: 2016-08-04 | Discharge: 2016-08-04 | Disposition: A | Discharge status: Home / Self Care | Payer: Commercial Managed Care - HMO | Source: Ambulatory Visit | Attending: Internal Medicine | Admitting: Internal Medicine

## 2016-08-04 DIAGNOSIS — K7290 Hepatic failure, unspecified without coma: Secondary | ICD-10-CM | POA: Diagnosis not present

## 2016-08-04 DIAGNOSIS — K7031 Alcoholic cirrhosis of liver with ascites: Secondary | ICD-10-CM | POA: Diagnosis not present

## 2016-08-04 MED ORDER — ALBUMIN HUMAN 25 % IV SOLN
50.0000 g | Freq: Once | INTRAVENOUS | Status: AC
  Administered 2016-08-04: 50 g via INTRAVENOUS
  Filled 2016-08-04: qty 200

## 2016-08-04 MED ORDER — SODIUM CHLORIDE 0.9 % IV SOLN
Freq: Once | INTRAVENOUS | Status: AC
  Administered 2016-08-04: 250 mL via INTRAVENOUS

## 2016-08-05 ENCOUNTER — Encounter (HOSPITAL_COMMUNITY)
Admission: RE | Admit: 2016-08-05 | Discharge: 2016-08-05 | Disposition: A | Discharge status: Home / Self Care | Payer: Commercial Managed Care - HMO | Source: Ambulatory Visit | Attending: Internal Medicine | Admitting: Internal Medicine

## 2016-08-05 DIAGNOSIS — K7031 Alcoholic cirrhosis of liver with ascites: Secondary | ICD-10-CM | POA: Insufficient documentation

## 2016-08-05 DIAGNOSIS — K7290 Hepatic failure, unspecified without coma: Secondary | ICD-10-CM | POA: Insufficient documentation

## 2016-08-05 MED ORDER — ALBUMIN HUMAN 25 % IV SOLN
50.0000 g | Freq: Once | INTRAVENOUS | Status: AC
  Administered 2016-08-05: 50 g via INTRAVENOUS
  Filled 2016-08-05: qty 200

## 2016-08-05 NOTE — Progress Notes (Signed)
Patient arrived to floor at 0910.  VSS and IV patent.  50 gms IV albumin administered without difficulty.  IV flushed and DC'd - WNL.  VSS.  Patient assisted off unit in NAD via WC.

## 2016-08-07 ENCOUNTER — Encounter (INDEPENDENT_AMBULATORY_CARE_PROVIDER_SITE_OTHER): Payer: Self-pay | Admitting: Internal Medicine

## 2016-08-09 ENCOUNTER — Emergency Department (HOSPITAL_COMMUNITY)
Admission: EM | Admit: 2016-08-09 | Discharge: 2016-08-09 | Disposition: A | Discharge status: Home / Self Care | Payer: Commercial Managed Care - HMO | Attending: Emergency Medicine | Admitting: Emergency Medicine

## 2016-08-09 ENCOUNTER — Emergency Department (HOSPITAL_COMMUNITY): Payer: Commercial Managed Care - HMO

## 2016-08-09 ENCOUNTER — Telehealth (INDEPENDENT_AMBULATORY_CARE_PROVIDER_SITE_OTHER): Payer: Self-pay | Admitting: *Deleted

## 2016-08-09 ENCOUNTER — Encounter (HOSPITAL_COMMUNITY): Payer: Self-pay | Admitting: Emergency Medicine

## 2016-08-09 DIAGNOSIS — K7031 Alcoholic cirrhosis of liver with ascites: Secondary | ICD-10-CM

## 2016-08-09 DIAGNOSIS — Z79899 Other long term (current) drug therapy: Secondary | ICD-10-CM | POA: Insufficient documentation

## 2016-08-09 DIAGNOSIS — N183 Chronic kidney disease, stage 3 (moderate): Secondary | ICD-10-CM | POA: Insufficient documentation

## 2016-08-09 DIAGNOSIS — R0602 Shortness of breath: Secondary | ICD-10-CM | POA: Diagnosis not present

## 2016-08-09 DIAGNOSIS — I129 Hypertensive chronic kidney disease with stage 1 through stage 4 chronic kidney disease, or unspecified chronic kidney disease: Secondary | ICD-10-CM | POA: Diagnosis not present

## 2016-08-09 DIAGNOSIS — J90 Pleural effusion, not elsewhere classified: Secondary | ICD-10-CM

## 2016-08-09 DIAGNOSIS — R05 Cough: Secondary | ICD-10-CM | POA: Diagnosis not present

## 2016-08-09 LAB — COMPREHENSIVE METABOLIC PANEL
ALBUMIN: 1.8 g/dL — AB (ref 3.5–5.0)
ALT: 13 U/L — AB (ref 14–54)
AST: 36 U/L (ref 15–41)
Alkaline Phosphatase: 106 U/L (ref 38–126)
Anion gap: 5 (ref 5–15)
BUN: 32 mg/dL — AB (ref 6–20)
CHLORIDE: 111 mmol/L (ref 101–111)
CO2: 21 mmol/L — AB (ref 22–32)
CREATININE: 2.25 mg/dL — AB (ref 0.44–1.00)
Calcium: 7.6 mg/dL — ABNORMAL LOW (ref 8.9–10.3)
GFR calc Af Amer: 26 mL/min — ABNORMAL LOW (ref >60)
GFR, EST NON AFRICAN AMERICAN: 23 mL/min — AB (ref >60)
Glucose, Bld: 98 mg/dL (ref 65–99)
POTASSIUM: 3.7 mmol/L (ref 3.5–5.1)
SODIUM: 137 mmol/L (ref 135–145)
Total Bilirubin: 2.9 mg/dL — ABNORMAL HIGH (ref 0.3–1.2)
Total Protein: 4 g/dL — ABNORMAL LOW (ref 6.5–8.1)

## 2016-08-09 LAB — CBC WITH DIFFERENTIAL/PLATELET
BASOS ABS: 0.1 10*3/uL (ref 0.0–0.1)
Basophils Relative: 1 %
EOS PCT: 1 %
Eosinophils Absolute: 0.2 10*3/uL (ref 0.0–0.7)
HCT: 29.8 % — ABNORMAL LOW (ref 36.0–46.0)
Hemoglobin: 9.8 g/dL — ABNORMAL LOW (ref 12.0–15.0)
LYMPHS PCT: 13 %
Lymphs Abs: 1.7 10*3/uL (ref 0.7–4.0)
MCH: 27.1 pg (ref 26.0–34.0)
MCHC: 32.9 g/dL (ref 30.0–36.0)
MCV: 82.3 fL (ref 78.0–100.0)
Monocytes Absolute: 1.5 10*3/uL — ABNORMAL HIGH (ref 0.1–1.0)
Monocytes Relative: 11 %
Neutro Abs: 10.4 10*3/uL — ABNORMAL HIGH (ref 1.7–7.7)
Neutrophils Relative %: 74 %
PLATELETS: 129 10*3/uL — AB (ref 150–400)
RBC: 3.62 MIL/uL — AB (ref 3.87–5.11)
RDW: 15.8 % — AB (ref 11.5–15.5)
WBC: 13.9 10*3/uL — AB (ref 4.0–10.5)

## 2016-08-09 LAB — AMMONIA: Ammonia: 25 umol/L (ref 9–35)

## 2016-08-09 LAB — TROPONIN I: Troponin I: 0.08 ng/mL (ref <0.03)

## 2016-08-09 MED ORDER — IPRATROPIUM-ALBUTEROL 0.5-2.5 (3) MG/3ML IN SOLN
3.0000 mL | Freq: Once | RESPIRATORY_TRACT | Status: AC
  Administered 2016-08-09: 3 mL via RESPIRATORY_TRACT
  Filled 2016-08-09: qty 3

## 2016-08-09 NOTE — ED Notes (Signed)
Per MD, Pt will discharge with IV access due to difficulty obtaining IV access.

## 2016-08-09 NOTE — ED Notes (Signed)
Pt left ED via wheelchair with no signs of distress. Pt verbalized discharge instructions.

## 2016-08-09 NOTE — ED Provider Notes (Signed)
[place] DEPT Provider Note   CSN: [id number] Arrival date & time: 08/09/16  1304     History   Chief Complaint Chief Complaint  Patient presents with  . Shortness of Breath    HPI Savannah Jackson is a 60 y.o. female.  Patient with known cirrhosis presents with a chief complaint of dyspnea, but no chest pain or diaphoresis.. No fever, sweats, chills, productive sputum. Review systems positive for generalized edema. The patient has had minimally elevated troponins in the past. She has appointment tomorrow at [facility] for her liver disease. She was advised to come to the emergency department by her gastroenterologist.      Past Medical History:  Diagnosis Date  . Bilateral leg edema   . Brain aneurysm   . Breast disorder    right breast cancer 2002  . Cancer [facility])    breast cancer  . Chronic back pain Diverticultis  . Cirrhosis ([place])    alcoholic  . DDD (degenerative disc disease), lumbar   . Diverticulosis    scope 2014  . ETOH abuse   . Hepatomegaly    scope 2014  . History of breast cancer 06/07/2016  . Hypertension   . Rotator cuff syndrome of left shoulder   . Sleep apnea   . Thickened endometrium 06/19/2016   Will get endo biopsy     Patient Active Problem List   Diagnosis Date Noted  . Thickened endometrium 06/19/2016  . PMB (postmenopausal bleeding) 06/07/2016  . History of breast cancer 06/07/2016  . Cough   . Esophageal varices ([place]) 12/21/2015  . Alcoholic cirrhosis of liver without ascites ([place])   . Anemia of chronic disease   . Peripheral edema 11/26/2015  . Anasarca 11/26/2015  . Acute renal failure (ARF) ([place]) 10/19/2015  . Metabolic acidosis 27/78/2423  . UTI (lower urinary tract infection) 09/24/2015  . Delirium due to another medical condition 09/24/2015  . Acute renal injury ([place]) 09/24/2015  . Elevated troponin 09/24/2015  . Altered mental status 08/30/2015  . Hepatic encephalopathy ([place]) 08/30/2015  . Edema   . SOB  (shortness of breath)   . Acute respiratory failure with hypoxia ([place])   . Recurrent right pleural effusion   . Alcoholic cirrhosis of liver with ascites ([place])   . Pleural effusion associated with hepatic disorder 07/03/2015  . Stage III chronic kidney disease 07/03/2015  . Elevated INR 07/03/2015  . Acute respiratory distress 07/03/2015  . BRBPR (bright red blood per rectum) 04/21/2015  . Lower extremity edema 04/21/2015  . Pleural effusion, right 04/21/2015  . Ascites 04/21/2015  . GI bleed 04/21/2015  . Anemia 04/21/2015  . Pleural effusion 04/21/2015  . Sleep apnea   . Chronic back pain   . ETOH abuse   . Bilateral leg edema   . Bleeding gastrointestinal   . Cirrhosis, alcoholic ([place]) 53/61/4431  . Abdominal pain, left lower quadrant 02/17/2013  . Diverticulitis of colon without hemorrhage 02/17/2013  . Hypertension     Past Surgical History:  Procedure Laterality Date  . BRAIN SURGERY     aneurysm at [place]  . CATARACT EXTRACTION Bilateral    [facility] 2 or 3 years ago  . CHOLECYSTECTOMY    . COLONOSCOPY N/A 04/10/2013   Procedure: COLONOSCOPY;  Surgeon: Rogene Houston, MD;  Location: [facility];  Service: Endoscopy;  Laterality: N/A;  1030-rescheduled to 1200 Ann notified pt  . ESOPHAGOGASTRODUODENOSCOPY N/A 04/22/2015  Procedure: ESOPHAGOGASTRODUODENOSCOPY (EGD);  Surgeon: Rogene Houston, MD;  Location: [facility];  Service: Endoscopy;  Laterality: N/A;  . RADIOLOGY WITH ANESTHESIA N/A 07/16/2015   Procedure: TIPS;  Surgeon: Medication Radiologist, MD;  Location: [place];  Service: Radiology;  Laterality: N/A;  . TOTAL KNEE ARTHROPLASTY     right. 2002    OB History    Gravida Para Term Preterm AB Living   1 1 1     1    SAB TAB Ectopic Multiple Live Births           1       Home Medications    Prior to Admission medications   Medication Sig Start Date End Date Taking? Authorizing Provider  acetaminophen (TYLENOL) 325 MG  tablet Take 325 mg by mouth 2 (two) times daily as needed for moderate pain.   Yes Historical Provider, MD  folic acid (FOLVITE) 1 MG tablet Take 1 tablet (1 mg total) by mouth daily. 04/29/15  Yes Rosita Fire, MD  lactulose (CHRONULAC) 10 GM/15ML solution Take 22.5 mLs (15 g total) by mouth 4 (four) times daily. Four times a day 02/29/16  Yes Rogene Houston, MD  rifaximin (XIFAXAN) 550 MG TABS tablet Take 550 mg by mouth 2 (two) times daily.   Yes Jacqulynn Cadet, MD  spironolactone (ALDACTONE) 25 MG tablet Take 1 tablet (25 mg total) by mouth daily. 04/25/16  Yes Rosita Fire, MD  thiamine 100 MG tablet Take 1 tablet (100 mg total) by mouth daily. 04/29/15  Yes Rosita Fire, MD  torsemide (DEMADEX) 20 MG tablet Take 30 mg by mouth daily.    Yes Historical Provider, MD  PRESCRIPTION MEDICATION Inject 20 mcg into the muscle. Ths was a second injection of the Hep B vaccines - Engerix - B    Historical Provider, MD    Family History Family History  Problem Relation Age of Onset  . Aneurysm Mother   . Other Daughter     knee replacement  . Hypertension Daughter     Social History Social History  Substance Use Topics  . Smoking status: Never Smoker  . Smokeless tobacco: Never Used  . Alcohol use No     Comment: none since 03/2015     Allergies   Latex and Vicodin [hydrocodone-acetaminophen]   Review of Systems Review of Systems  All other systems reviewed and are negative.    Physical Exam Updated Vital Signs BP 149/63   Pulse 85   Temp 98.7 F (37.1 C) (Oral)   Resp 23   Ht 5\' 3"  (1.6 m)   Wt 216 lb 8 oz (98.2 kg)   SpO2 99%   BMI 38.35 kg/m   Physical Exam  Constitutional: She is oriented to person, place, and time.  Obese, no frank dyspnea.  HENT:  Head: Normocephalic and atraumatic.  Eyes: Conjunctivae are normal.  Neck: Neck supple.  Cardiovascular: Normal rate and regular rhythm.   Pulmonary/Chest: Effort normal and breath sounds normal.  Abdominal:  Soft. Bowel sounds are normal.  Minimal ascites  Musculoskeletal: Normal range of motion.  Neurological: She is alert and oriented to person, place, and time.  Skin:  3+ peripheral edema  Psychiatric: She has a normal mood and affect. Her behavior is normal.  Nursing note and vitals reviewed.    ED Treatments / Results  Labs (all labs ordered are listed, but only abnormal results are displayed) Labs Reviewed  CBC WITH DIFFERENTIAL/PLATELET - Abnormal; Notable for the following:  Result Value   WBC 13.9 (*)    RBC 3.62 (*)    Hemoglobin 9.8 (*)    HCT 29.8 (*)    RDW 15.8 (*)    Platelets 129 (*)    Neutro Abs 10.4 (*)    Monocytes Absolute 1.5 (*)    All other components within normal limits  COMPREHENSIVE METABOLIC PANEL  TROPONIN I  AMMONIA    EKG  EKG Interpretation  Date/Time:  Wednesday August 09 2016 13:29:16 EDT Ventricular Rate:  62 PR Interval:    QRS Duration: 89 QT Interval:  429 QTC Calculation: 436 R Axis:   17 Text Interpretation:  Sinus rhythm LVH with secondary repolarization abnormality Confirmed by Lacinda Axon  MD, Asaf Elmquist ([id number]) on 08/09/2016 2:31:28 PM       Radiology Dg Chest 2 View  Result Date: 08/09/2016 CLINICAL DATA:  Shortness of breath and cough for 2 weeks EXAM: CHEST  2 VIEW COMPARISON:  04/22/2016 FINDINGS: Cardiac shadow is within normal limits at the upper limits of normal. The lungs are well aerated bilaterally. A right-sided pleural effusion is noted increased from the prior exam. The patchy changes seen previously have resolved in the right lung however. There is likely some right basilar atelectasis present. No bony abnormality is seen. IMPRESSION: Right basilar changes consisting of an effusion. There is likely some atelectasis present as well. Electronically Signed   By: Inez Catalina M.D.   On: 08/09/2016 15:26    Procedures Procedures (including critical care time)  Medications Ordered in ED Medications    ipratropium-albuterol (DUONEB) 0.5-2.5 (3) MG/3ML nebulizer solution 3 mL (not administered)     Initial Impression / Assessment and Plan / ED Course  I have reviewed the triage vital signs and the nursing notes.  Pertinent labs & imaging results that were available during my care of the patient were reviewed by me and considered in my medical decision making (see chart for details).  Clinical Course    Patient has known severe liver disease. Abnormal tests reviewed including a small right pleural effusion, elevated creatinine at 2.25, elevated total bilirubin at 2.9, elevated troponin at 0.08.  Discussed troponin with cardiologist. Troponin has been minimally elevated several times in the past. She has an appointment tomorrow at [facility]. Both she and her husband are determined to make the appointment. Ammonia level pending. Disc c Dr Roderic Palau.  Probable discharge.  Probable discharge.  Final Clinical Impressions(s) / ED Diagnoses   Final diagnoses:  Alcoholic cirrhosis of liver with ascites ([place])  Pleural effusion, right    New Prescriptions New Prescriptions   No medications on file     Nat Christen, MD 08/09/16 1642

## 2016-08-09 NOTE — ED Notes (Signed)
Pt return from xray.

## 2016-08-09 NOTE — Telephone Encounter (Signed)
Patient called.  She states that on Saturday ,October 7,2017 after getting her 3 rd dose of albumin. She became short of breathe , and felt that she could not move. She says that her symptoms have worsened day by day. Patient has an appointment in [facility] tomorrow. Weight is 210 lbs per patient ,and she feels that the fluid needs to be drawn off or something.  Discussed this information with Lelon Perla , she advises that the patient should go to the ED for evaluation. Patient was called and advised . She is going to [facility]/ED.

## 2016-08-09 NOTE — ED Notes (Signed)
MD aware of elevated troponin of 0.08

## 2016-08-09 NOTE — ED Notes (Signed)
Pt reports is in the process of going through the process to get on a liver transplant list.  Today swelling is worse and sob is worse.  Reports has fluid under her skin on trunk and legs.

## 2016-08-09 NOTE — ED Triage Notes (Signed)
Pt reports shortness of breath with coughing x2 weeks the patient reports intermittent wheezing since last week. Pt husband reports pt has gained approximately 5lbs in the last few days. Moderate dyspnea and unable to say complete sentences.

## 2016-08-09 NOTE — ED Notes (Signed)
Pt leaving with Audrea Muscat from xray to get xray.

## 2016-08-09 NOTE — Discharge Instructions (Signed)
Follow-up with your liver doctor tomorrow as planned. We will give you a copy of your tests today to bring with you.

## 2016-08-09 NOTE — ED Notes (Signed)
CRITICAL VALUE ALERT  Critical value received:  Troponin - 0.08  Date of notification:  08/09/2016  Time of notification:  1623  Critical value read back: yes  Nurse who received alert:  LJS  MD notified (1st page):  Dr Lacinda Axon  Time of first page:  1624  MD notified (2nd page):  Time of second page:  Responding MD:   Dr Lacinda Axon  Time MD responded:  [phone]

## 2016-08-10 DIAGNOSIS — J9811 Atelectasis: Secondary | ICD-10-CM | POA: Diagnosis not present

## 2016-08-10 DIAGNOSIS — R188 Other ascites: Secondary | ICD-10-CM | POA: Diagnosis not present

## 2016-08-10 DIAGNOSIS — J90 Pleural effusion, not elsewhere classified: Secondary | ICD-10-CM | POA: Diagnosis not present

## 2016-08-10 DIAGNOSIS — R0609 Other forms of dyspnea: Secondary | ICD-10-CM | POA: Diagnosis not present

## 2016-08-10 DIAGNOSIS — K7031 Alcoholic cirrhosis of liver with ascites: Secondary | ICD-10-CM | POA: Diagnosis not present

## 2016-08-10 DIAGNOSIS — K766 Portal hypertension: Secondary | ICD-10-CM | POA: Diagnosis not present

## 2016-08-10 DIAGNOSIS — R609 Edema, unspecified: Secondary | ICD-10-CM | POA: Diagnosis not present

## 2016-08-10 DIAGNOSIS — I8500 Esophageal varices without bleeding: Secondary | ICD-10-CM | POA: Diagnosis not present

## 2016-08-10 DIAGNOSIS — K7290 Hepatic failure, unspecified without coma: Secondary | ICD-10-CM | POA: Diagnosis not present

## 2016-08-17 DIAGNOSIS — K7290 Hepatic failure, unspecified without coma: Secondary | ICD-10-CM | POA: Diagnosis not present

## 2016-08-24 DIAGNOSIS — K7290 Hepatic failure, unspecified without coma: Secondary | ICD-10-CM | POA: Diagnosis not present

## 2016-08-25 DIAGNOSIS — I129 Hypertensive chronic kidney disease with stage 1 through stage 4 chronic kidney disease, or unspecified chronic kidney disease: Secondary | ICD-10-CM | POA: Diagnosis not present

## 2016-08-25 DIAGNOSIS — Z79899 Other long term (current) drug therapy: Secondary | ICD-10-CM | POA: Diagnosis not present

## 2016-08-25 DIAGNOSIS — Z853 Personal history of malignant neoplasm of breast: Secondary | ICD-10-CM | POA: Diagnosis not present

## 2016-08-25 DIAGNOSIS — R601 Generalized edema: Secondary | ICD-10-CM | POA: Diagnosis not present

## 2016-08-25 DIAGNOSIS — N183 Chronic kidney disease, stage 3 (moderate): Secondary | ICD-10-CM | POA: Diagnosis not present

## 2016-08-25 DIAGNOSIS — K7031 Alcoholic cirrhosis of liver with ascites: Secondary | ICD-10-CM | POA: Diagnosis not present

## 2016-08-25 DIAGNOSIS — Z885 Allergy status to narcotic agent status: Secondary | ICD-10-CM | POA: Diagnosis not present

## 2016-08-25 DIAGNOSIS — Z91040 Latex allergy status: Secondary | ICD-10-CM | POA: Diagnosis not present

## 2016-08-25 DIAGNOSIS — J90 Pleural effusion, not elsewhere classified: Secondary | ICD-10-CM | POA: Diagnosis not present

## 2016-08-26 DIAGNOSIS — G4730 Sleep apnea, unspecified: Secondary | ICD-10-CM | POA: Diagnosis not present

## 2016-08-26 DIAGNOSIS — N183 Chronic kidney disease, stage 3 (moderate): Secondary | ICD-10-CM | POA: Diagnosis not present

## 2016-08-26 DIAGNOSIS — K7031 Alcoholic cirrhosis of liver with ascites: Secondary | ICD-10-CM | POA: Diagnosis not present

## 2016-08-26 DIAGNOSIS — N022 Recurrent and persistent hematuria with diffuse membranous glomerulonephritis: Secondary | ICD-10-CM | POA: Diagnosis not present

## 2016-08-26 DIAGNOSIS — N99841 Postprocedural hematoma of a genitourinary system organ or structure following other procedure: Secondary | ICD-10-CM | POA: Diagnosis not present

## 2016-08-26 DIAGNOSIS — N179 Acute kidney failure, unspecified: Secondary | ICD-10-CM | POA: Diagnosis not present

## 2016-08-26 DIAGNOSIS — T85858A Stenosis due to other internal prosthetic devices, implants and grafts, initial encounter: Secondary | ICD-10-CM | POA: Diagnosis not present

## 2016-08-26 DIAGNOSIS — J90 Pleural effusion, not elsewhere classified: Secondary | ICD-10-CM | POA: Diagnosis not present

## 2016-08-26 DIAGNOSIS — S301XXA Contusion of abdominal wall, initial encounter: Secondary | ICD-10-CM | POA: Diagnosis not present

## 2016-08-26 DIAGNOSIS — J948 Other specified pleural conditions: Secondary | ICD-10-CM | POA: Diagnosis not present

## 2016-08-30 DIAGNOSIS — N042 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, unspecified: Secondary | ICD-10-CM

## 2016-08-30 DIAGNOSIS — N022 Recurrent and persistent hematuria with diffuse membranous glomerulonephritis: Secondary | ICD-10-CM | POA: Insufficient documentation

## 2016-08-30 DIAGNOSIS — N1832 Chronic kidney disease, stage 3b: Secondary | ICD-10-CM | POA: Diagnosis present

## 2016-08-30 DIAGNOSIS — N183 Chronic kidney disease, stage 3 (moderate): Secondary | ICD-10-CM

## 2016-08-30 DIAGNOSIS — N052 Unspecified nephritic syndrome with diffuse membranous glomerulonephritis: Secondary | ICD-10-CM | POA: Diagnosis present

## 2016-08-30 HISTORY — DX: 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, unspecified: N04.20

## 2016-08-30 HISTORY — DX: 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: N04.2

## 2016-08-30 HISTORY — DX: Unspecified nephritic syndrome with diffuse membranous glomerulonephritis: N05.2

## 2016-08-30 HISTORY — DX: Chronic kidney disease, stage 3b: N18.32

## 2016-09-04 DIAGNOSIS — K7290 Hepatic failure, unspecified without coma: Secondary | ICD-10-CM | POA: Diagnosis not present

## 2016-09-11 IMAGING — CR DG CHEST 1V
1 series · 1 of 1 positions shown · non-contrast
Comparison: 07/03/2015

CLINICAL DATA: Status post right thoracentesis.

EXAM:
CHEST  1 VIEW

[chest pa]
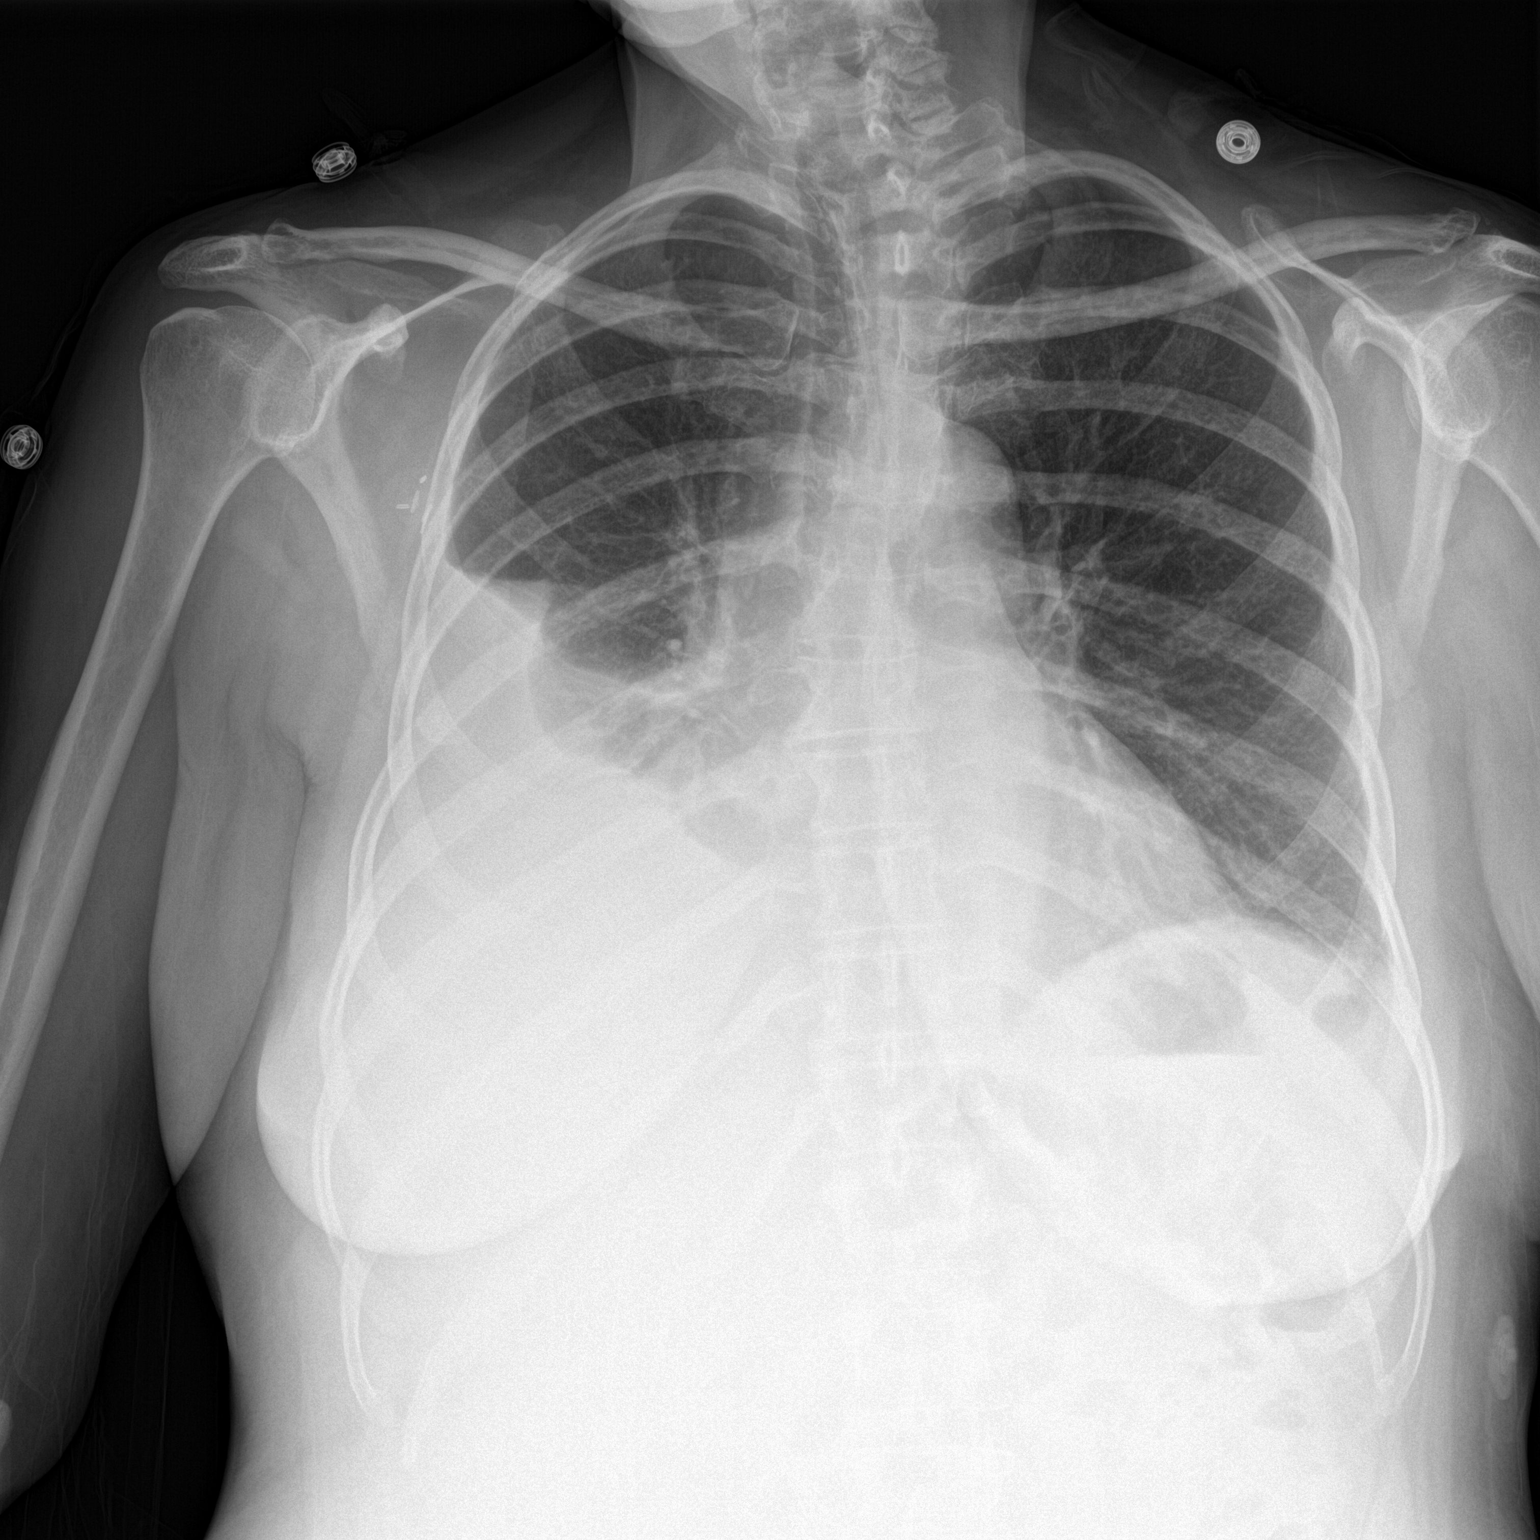

[1 of 1 positions shown; findings below may reference images not displayed]

FINDINGS: There is a moderate right pleural effusion which has decreased in
size compared with 07/03/2015. There is no right pneumothorax. There
is no left pleural effusion. There is no left pneumothorax. There is
no focal consolidation. The heart and mediastinal contours are
stable.

The osseous structures are unremarkable.
IMPRESSION: Moderate right pleural effusion without a pneumothorax.

## 2016-09-14 DIAGNOSIS — N022 Recurrent and persistent hematuria with diffuse membranous glomerulonephritis: Secondary | ICD-10-CM | POA: Diagnosis not present

## 2016-10-02 DIAGNOSIS — I8500 Esophageal varices without bleeding: Secondary | ICD-10-CM | POA: Diagnosis not present

## 2016-10-02 DIAGNOSIS — J90 Pleural effusion, not elsewhere classified: Secondary | ICD-10-CM | POA: Diagnosis not present

## 2016-10-02 DIAGNOSIS — K838 Other specified diseases of biliary tract: Secondary | ICD-10-CM | POA: Diagnosis not present

## 2016-10-02 DIAGNOSIS — R140 Abdominal distension (gaseous): Secondary | ICD-10-CM | POA: Diagnosis not present

## 2016-10-02 DIAGNOSIS — J9811 Atelectasis: Secondary | ICD-10-CM | POA: Diagnosis not present

## 2016-10-02 DIAGNOSIS — K7031 Alcoholic cirrhosis of liver with ascites: Secondary | ICD-10-CM | POA: Diagnosis not present

## 2016-10-02 DIAGNOSIS — K7290 Hepatic failure, unspecified without coma: Secondary | ICD-10-CM | POA: Diagnosis not present

## 2016-10-02 DIAGNOSIS — R05 Cough: Secondary | ICD-10-CM | POA: Diagnosis not present

## 2016-10-02 DIAGNOSIS — R6883 Chills (without fever): Secondary | ICD-10-CM | POA: Diagnosis not present

## 2016-10-03 ENCOUNTER — Ambulatory Visit (INDEPENDENT_AMBULATORY_CARE_PROVIDER_SITE_OTHER): Payer: Commercial Managed Care - HMO | Admitting: Internal Medicine

## 2016-10-09 ENCOUNTER — Encounter (INDEPENDENT_AMBULATORY_CARE_PROVIDER_SITE_OTHER): Payer: Self-pay

## 2016-10-09 ENCOUNTER — Encounter (INDEPENDENT_AMBULATORY_CARE_PROVIDER_SITE_OTHER): Payer: Self-pay | Admitting: Internal Medicine

## 2016-10-29 ENCOUNTER — Inpatient Hospital Stay (HOSPITAL_COMMUNITY)
Admission: EM | Admit: 2016-10-29 | Discharge: 2016-10-31 | DRG: 433 | Disposition: A | Discharge status: Other Acute Inpatient Hospital | Payer: Commercial Managed Care - HMO | Attending: Family Medicine | Admitting: Family Medicine

## 2016-10-29 ENCOUNTER — Encounter (HOSPITAL_COMMUNITY): Payer: Self-pay | Admitting: Emergency Medicine

## 2016-10-29 ENCOUNTER — Emergency Department (HOSPITAL_COMMUNITY): Payer: Commercial Managed Care - HMO

## 2016-10-29 DIAGNOSIS — K769 Liver disease, unspecified: Secondary | ICD-10-CM

## 2016-10-29 DIAGNOSIS — K7040 Alcoholic hepatic failure without coma: Secondary | ICD-10-CM | POA: Diagnosis not present

## 2016-10-29 DIAGNOSIS — D638 Anemia in other chronic diseases classified elsewhere: Secondary | ICD-10-CM | POA: Diagnosis not present

## 2016-10-29 DIAGNOSIS — N052 Unspecified nephritic syndrome with diffuse membranous glomerulonephritis: Secondary | ICD-10-CM | POA: Diagnosis present

## 2016-10-29 DIAGNOSIS — I8510 Secondary esophageal varices without bleeding: Secondary | ICD-10-CM | POA: Diagnosis present

## 2016-10-29 DIAGNOSIS — I8500 Esophageal varices without bleeding: Secondary | ICD-10-CM | POA: Diagnosis present

## 2016-10-29 DIAGNOSIS — Z853 Personal history of malignant neoplasm of breast: Secondary | ICD-10-CM

## 2016-10-29 DIAGNOSIS — R600 Localized edema: Secondary | ICD-10-CM | POA: Diagnosis present

## 2016-10-29 DIAGNOSIS — I130 Hypertensive heart and chronic kidney disease with heart failure and stage 1 through stage 4 chronic kidney disease, or unspecified chronic kidney disease: Secondary | ICD-10-CM | POA: Diagnosis present

## 2016-10-29 DIAGNOSIS — J9601 Acute respiratory failure with hypoxia: Secondary | ICD-10-CM | POA: Diagnosis not present

## 2016-10-29 DIAGNOSIS — E8770 Fluid overload, unspecified: Secondary | ICD-10-CM | POA: Diagnosis not present

## 2016-10-29 DIAGNOSIS — N184 Chronic kidney disease, stage 4 (severe): Secondary | ICD-10-CM | POA: Diagnosis present

## 2016-10-29 DIAGNOSIS — J9811 Atelectasis: Secondary | ICD-10-CM | POA: Diagnosis not present

## 2016-10-29 DIAGNOSIS — E669 Obesity, unspecified: Secondary | ICD-10-CM | POA: Diagnosis present

## 2016-10-29 DIAGNOSIS — Z8249 Family history of ischemic heart disease and other diseases of the circulatory system: Secondary | ICD-10-CM

## 2016-10-29 DIAGNOSIS — K7030 Alcoholic cirrhosis of liver without ascites: Secondary | ICD-10-CM | POA: Diagnosis present

## 2016-10-29 DIAGNOSIS — Z79899 Other long term (current) drug therapy: Secondary | ICD-10-CM | POA: Diagnosis not present

## 2016-10-29 DIAGNOSIS — I5032 Chronic diastolic (congestive) heart failure: Secondary | ICD-10-CM | POA: Diagnosis present

## 2016-10-29 DIAGNOSIS — N179 Acute kidney failure, unspecified: Secondary | ICD-10-CM | POA: Diagnosis present

## 2016-10-29 DIAGNOSIS — Z6838 Body mass index (BMI) 38.0-38.9, adult: Secondary | ICD-10-CM | POA: Diagnosis not present

## 2016-10-29 DIAGNOSIS — N1832 Chronic kidney disease, stage 3b: Secondary | ICD-10-CM | POA: Diagnosis present

## 2016-10-29 DIAGNOSIS — K7031 Alcoholic cirrhosis of liver with ascites: Secondary | ICD-10-CM | POA: Diagnosis present

## 2016-10-29 DIAGNOSIS — N186 End stage renal disease: Secondary | ICD-10-CM | POA: Diagnosis not present

## 2016-10-29 DIAGNOSIS — E8809 Other disorders of plasma-protein metabolism, not elsewhere classified: Secondary | ICD-10-CM | POA: Diagnosis not present

## 2016-10-29 DIAGNOSIS — R0600 Dyspnea, unspecified: Secondary | ICD-10-CM | POA: Diagnosis present

## 2016-10-29 DIAGNOSIS — R748 Abnormal levels of other serum enzymes: Secondary | ICD-10-CM | POA: Diagnosis not present

## 2016-10-29 DIAGNOSIS — N042 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: Secondary | ICD-10-CM | POA: Diagnosis present

## 2016-10-29 DIAGNOSIS — Z9889 Other specified postprocedural states: Secondary | ICD-10-CM

## 2016-10-29 DIAGNOSIS — Z96651 Presence of right artificial knee joint: Secondary | ICD-10-CM | POA: Diagnosis present

## 2016-10-29 DIAGNOSIS — D72829 Elevated white blood cell count, unspecified: Secondary | ICD-10-CM | POA: Diagnosis not present

## 2016-10-29 DIAGNOSIS — I132 Hypertensive heart and chronic kidney disease with heart failure and with stage 5 chronic kidney disease, or end stage renal disease: Secondary | ICD-10-CM | POA: Diagnosis not present

## 2016-10-29 DIAGNOSIS — J90 Pleural effusion, not elsewhere classified: Secondary | ICD-10-CM | POA: Diagnosis not present

## 2016-10-29 DIAGNOSIS — G4733 Obstructive sleep apnea (adult) (pediatric): Secondary | ICD-10-CM | POA: Diagnosis not present

## 2016-10-29 DIAGNOSIS — R05 Cough: Secondary | ICD-10-CM | POA: Diagnosis not present

## 2016-10-29 DIAGNOSIS — R601 Generalized edema: Secondary | ICD-10-CM | POA: Diagnosis not present

## 2016-10-29 DIAGNOSIS — N183 Chronic kidney disease, stage 3 (moderate): Secondary | ICD-10-CM

## 2016-10-29 DIAGNOSIS — D472 Monoclonal gammopathy: Secondary | ICD-10-CM | POA: Diagnosis present

## 2016-10-29 DIAGNOSIS — Z95828 Presence of other vascular implants and grafts: Secondary | ICD-10-CM

## 2016-10-29 DIAGNOSIS — R0602 Shortness of breath: Secondary | ICD-10-CM | POA: Diagnosis present

## 2016-10-29 DIAGNOSIS — J918 Pleural effusion in other conditions classified elsewhere: Secondary | ICD-10-CM | POA: Diagnosis present

## 2016-10-29 DIAGNOSIS — D631 Anemia in chronic kidney disease: Secondary | ICD-10-CM | POA: Diagnosis not present

## 2016-10-29 DIAGNOSIS — J948 Other specified pleural conditions: Secondary | ICD-10-CM | POA: Diagnosis not present

## 2016-10-29 DIAGNOSIS — E872 Acidosis: Secondary | ICD-10-CM | POA: Diagnosis present

## 2016-10-29 DIAGNOSIS — G92 Toxic encephalopathy: Secondary | ICD-10-CM | POA: Diagnosis not present

## 2016-10-29 DIAGNOSIS — J189 Pneumonia, unspecified organism: Secondary | ICD-10-CM | POA: Diagnosis not present

## 2016-10-29 DIAGNOSIS — N189 Chronic kidney disease, unspecified: Secondary | ICD-10-CM | POA: Diagnosis not present

## 2016-10-29 DIAGNOSIS — R0603 Acute respiratory distress: Secondary | ICD-10-CM | POA: Diagnosis present

## 2016-10-29 DIAGNOSIS — Z9689 Presence of other specified functional implants: Secondary | ICD-10-CM | POA: Diagnosis not present

## 2016-10-29 DIAGNOSIS — Z0389 Encounter for observation for other suspected diseases and conditions ruled out: Secondary | ICD-10-CM | POA: Diagnosis not present

## 2016-10-29 DIAGNOSIS — R918 Other nonspecific abnormal finding of lung field: Secondary | ICD-10-CM | POA: Diagnosis not present

## 2016-10-29 HISTORY — DX: 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: N04.2

## 2016-10-29 HISTORY — DX: Unspecified nephritic syndrome with diffuse membranous glomerulonephritis: N05.2

## 2016-10-29 HISTORY — DX: Chronic kidney disease, stage 3 (moderate): N18.3

## 2016-10-29 LAB — COMPREHENSIVE METABOLIC PANEL
ALT: 19 U/L (ref 14–54)
ANION GAP: 6 (ref 5–15)
AST: 57 U/L — ABNORMAL HIGH (ref 15–41)
Albumin: 1.2 g/dL — ABNORMAL LOW (ref 3.5–5.0)
Alkaline Phosphatase: 157 U/L — ABNORMAL HIGH (ref 38–126)
BUN: 45 mg/dL — ABNORMAL HIGH (ref 6–20)
CALCIUM: 7.8 mg/dL — AB (ref 8.9–10.3)
CHLORIDE: 113 mmol/L — AB (ref 101–111)
CO2: 19 mmol/L — AB (ref 22–32)
Creatinine, Ser: 2.28 mg/dL — ABNORMAL HIGH (ref 0.44–1.00)
GFR, EST AFRICAN AMERICAN: 25 mL/min — AB (ref >60)
GFR, EST NON AFRICAN AMERICAN: 22 mL/min — AB (ref >60)
Glucose, Bld: 97 mg/dL (ref 65–99)
Potassium: 3.9 mmol/L (ref 3.5–5.1)
SODIUM: 138 mmol/L (ref 135–145)
Total Bilirubin: 1.2 mg/dL (ref 0.3–1.2)
Total Protein: 4.7 g/dL — ABNORMAL LOW (ref 6.5–8.1)

## 2016-10-29 LAB — CBC WITH DIFFERENTIAL/PLATELET
HCT: 36.5 % (ref 36.0–46.0)
Hemoglobin: 12 g/dL (ref 12.0–15.0)
MCH: 27.5 pg (ref 26.0–34.0)
MCHC: 32.9 g/dL (ref 30.0–36.0)
MCV: 83.5 fL (ref 78.0–100.0)
PLATELETS: 175 10*3/uL (ref 150–400)
RBC: 4.37 MIL/uL (ref 3.87–5.11)
RDW: 16.6 % — ABNORMAL HIGH (ref 11.5–15.5)
WBC: 18.8 10*3/uL — ABNORMAL HIGH (ref 4.0–10.5)

## 2016-10-29 MED ORDER — RIFAXIMIN 550 MG PO TABS
550.0000 mg | ORAL_TABLET | Freq: Two times a day (BID) | ORAL | Status: DC
  Administered 2016-10-29 – 2016-10-31 (×4): 550 mg via ORAL
  Filled 2016-10-29 (×4): qty 1

## 2016-10-29 MED ORDER — SPIRONOLACTONE 25 MG PO TABS: 25.0000 mg | ORAL_TABLET | Freq: Every day | ORAL | Status: DC

## 2016-10-29 MED ORDER — IPRATROPIUM-ALBUTEROL 0.5-2.5 (3) MG/3ML IN SOLN
3.0000 mL | Freq: Once | RESPIRATORY_TRACT | Status: AC
  Administered 2016-10-29: 3 mL via RESPIRATORY_TRACT
  Filled 2016-10-29: qty 3

## 2016-10-29 MED ORDER — ALBUTEROL SULFATE (2.5 MG/3ML) 0.083% IN NEBU
2.5000 mg | INHALATION_SOLUTION | Freq: Once | RESPIRATORY_TRACT | Status: AC
  Administered 2016-10-29: 2.5 mg via RESPIRATORY_TRACT
  Filled 2016-10-29: qty 3

## 2016-10-29 MED ORDER — ACETAMINOPHEN 325 MG PO TABS
650.0000 mg | ORAL_TABLET | Freq: Four times a day (QID) | ORAL | Status: DC | PRN
  Administered 2016-10-29 – 2016-10-31 (×5): 650 mg via ORAL
  Filled 2016-10-29 (×5): qty 2

## 2016-10-29 MED ORDER — ONDANSETRON HCL 4 MG PO TABS: 4.0000 mg | ORAL_TABLET | Freq: Four times a day (QID) | ORAL | Status: DC | PRN

## 2016-10-29 MED ORDER — TORSEMIDE 20 MG PO TABS: 30.0000 mg | ORAL_TABLET | Freq: Every day | ORAL | Status: DC

## 2016-10-29 MED ORDER — ACETAMINOPHEN 650 MG RE SUPP: 650.0000 mg | Freq: Four times a day (QID) | RECTAL | Status: DC | PRN

## 2016-10-29 MED ORDER — IPRATROPIUM-ALBUTEROL 0.5-2.5 (3) MG/3ML IN SOLN
3.0000 mL | Freq: Four times a day (QID) | RESPIRATORY_TRACT | Status: DC
  Filled 2016-10-29: qty 3

## 2016-10-29 MED ORDER — IPRATROPIUM-ALBUTEROL 0.5-2.5 (3) MG/3ML IN SOLN: 3.0000 mL | Freq: Four times a day (QID) | RESPIRATORY_TRACT | Status: DC | PRN

## 2016-10-29 MED ORDER — ONDANSETRON HCL 4 MG/2ML IJ SOLN
4.0000 mg | Freq: Once | INTRAMUSCULAR | Status: AC
  Administered 2016-10-29: 4 mg via INTRAVENOUS
  Filled 2016-10-29: qty 2

## 2016-10-29 MED ORDER — ALBUMIN HUMAN 25 % IV SOLN
12.5000 g | Freq: Once | INTRAVENOUS | Status: AC
  Administered 2016-10-29: 12.5 g via INTRAVENOUS
  Filled 2016-10-29: qty 50

## 2016-10-29 MED ORDER — VITAMIN B-1 100 MG PO TABS
100.0000 mg | ORAL_TABLET | Freq: Every day | ORAL | Status: DC
  Administered 2016-10-29 – 2016-10-31 (×3): 100 mg via ORAL
  Filled 2016-10-29 (×3): qty 1

## 2016-10-29 MED ORDER — ALBUTEROL SULFATE (2.5 MG/3ML) 0.083% IN NEBU: 2.5000 mg | INHALATION_SOLUTION | RESPIRATORY_TRACT | Status: DC | PRN

## 2016-10-29 MED ORDER — MORPHINE SULFATE (PF) 2 MG/ML IV SOLN: 2.0000 mg | INTRAVENOUS | Status: DC | PRN

## 2016-10-29 MED ORDER — FUROSEMIDE 10 MG/ML IJ SOLN
40.0000 mg | Freq: Four times a day (QID) | INTRAMUSCULAR | Status: DC
  Administered 2016-10-29 – 2016-10-30 (×2): 40 mg via INTRAVENOUS
  Filled 2016-10-29 (×2): qty 4

## 2016-10-29 MED ORDER — ONDANSETRON HCL 4 MG/2ML IJ SOLN: 4.0000 mg | Freq: Four times a day (QID) | INTRAMUSCULAR | Status: DC | PRN

## 2016-10-29 MED ORDER — LACTULOSE 10 GM/15ML PO SOLN
15.0000 g | Freq: Four times a day (QID) | ORAL | Status: DC
  Administered 2016-10-29 – 2016-10-31 (×7): 15 g via ORAL
  Filled 2016-10-29 (×6): qty 30

## 2016-10-29 MED ORDER — FOLIC ACID 1 MG PO TABS
1.0000 mg | ORAL_TABLET | Freq: Every day | ORAL | Status: DC
  Administered 2016-10-30 – 2016-10-31 (×2): 1 mg via ORAL
  Filled 2016-10-29 (×2): qty 1

## 2016-10-29 NOTE — ED Notes (Signed)
Respiratory paged

## 2016-10-29 NOTE — ED Triage Notes (Signed)
Pt reports sob and cp in center of chest radiating to right arm/back x1 month.  Pain and sob has gotten worse, causing pt to come in today.  Pt also has non-productive cough.

## 2016-10-29 NOTE — ED Notes (Signed)
Phlebotomist came out at this time stating that pt's spouse was getting upset and wanted the doctor to sign a paper stating nothing would happen to pt. MD Zammit notified and in to talk with pt and family.

## 2016-10-29 NOTE — ED Notes (Signed)
While pt was talking on phone O2 sat began to drop to 88%, pt placed on 2L [place]. O2 sat now at 96%.

## 2016-10-29 NOTE — H&P (Signed)
History and Physical    Savannah Jackson BWG:[id number] DOB: 1955-12-17 DOA: 10/29/2016  PCP: Rosita Fire, MD Consultants:  Park Liter; [facility] Transplant Hepatologist - Dorthula Perfect - Nephrology at [facility]; Laurence Ferrari at [facility] (for TIPS, 07/16/15) Patient coming from: home - lives with husband; NOK: husband, [phone]  Chief Complaint: SOB  HPI: Savannah Jackson is a 60 y.o. female with medical history significant of decompensated liver disease and hepatic encephalopathy with concurrent renal failure (see below).  Patient has been SOB with chest pain radiating around shoulder blade.  +cough.  Chest pain started yesterday but cough has been going on for more than 2 months.  Has been to [facility] and had fluid drawn off and was better for a little while, admitted on 10/27 (see below).  SOB for a month but getting worse over the last week.  +chills/sweats but no known fevers.  Non-productive cough.  +nauseated.  Chronic LE edema, >1 year, no real change.  She was admitted to [facility] from 10/27-11/2/17 for a similar issue.  She has a h/o alcoholic cirrhosis and is being evaluated for liver transplantation, but her MELD score is low enough that this issue is not being actively pursued at this time.  She has a h/o TIPS performed at [facility] in 2016 (h/o grade 1 EV, HE, hepatic hydrothorax, and ascites) as well as CKD, HTN, breast cancer (2000) and a brain aneurysm (1997).  During the hospitalization, she was treated for volume overload resulting from worsening renal function as well as discontinuation of diuretics prior to admission as well as a large R-sided pleural effusion from hepatic hydrothorax and compressive atelectasis.  She had 1.4L of fluid removed with a thoracentesis on 10/28.  She was also found to have grade 3 diastolic dysfunction on Echo.  Her MELD score was 16-18 and TIPS venography on 10/31 showed several areas of narrowing present which were successfully treated with balloon angiography.  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  She followed up with [facility] nephrology on 11/16 and there was a plan for initiation of Rituximab therapy, but plan for repeat UPCR prior to initiation of therapy and for f/u visit with both nephrology and the renal transplant team.  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  While she does have an appointment scheduled with nephrology in mid-January, the patient and her husband are not aware of these additional appointments having been scheduled.  Of note, she did not tolerate prednisone and so has not been on that therapy.  Her most recent visit at [facility] was with Dr. Monica Martinez on 12/4.  A letter from Dr. Monica Martinez to Drs. 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    ED Course: Per Dr. Roderic Palau: Patient with cirrhosis and large pleural effusion on the right. She will be admitted to medicine at [facility]. She will need to have the effusion tapped  Review of Systems: As per HPI; otherwise 10 point review of systems reviewed and negative.   Ambulatory Status:  Ambulates without assistance  Past Medical History:  Diagnosis Date  . Bilateral leg edema   . Brain aneurysm   . Breast disorder    right breast cancer 2002  . Cancer [facility])    breast cancer  . Chronic back pain Diverticultis  . Cirrhosis ([place])    alcoholic  . DDD (degenerative disc disease), lumbar   . Diverticulosis    scope 2014  . ETOH abuse   . Hepatomegaly    scope 2014  .  History of breast cancer 06/07/2016  . Hypertension   . Rotator cuff syndrome of left shoulder   . Sleep apnea   . Thickened endometrium 06/19/2016   Will get endo biopsy     Past Surgical History:  Procedure Laterality Date  . BRAIN SURGERY     aneurysm at [place]  . CATARACT EXTRACTION Bilateral    [facility] 2 or 3 years ago  . CHOLECYSTECTOMY    . COLONOSCOPY N/A 04/10/2013   Procedure: COLONOSCOPY;  Surgeon: Rogene Houston, MD;  [facility];  Service: Endoscopy;  Laterality: N/A;  1030-rescheduled to 1200 Ann notified pt  . ESOPHAGOGASTRODUODENOSCOPY N/A 04/22/2015   Procedure: ESOPHAGOGASTRODUODENOSCOPY (EGD);  Surgeon: Rogene Houston, MD;  Location: [facility];  Service: Endoscopy;  Laterality: N/A;  . RADIOLOGY WITH ANESTHESIA N/A 07/16/2015   Procedure: TIPS;  Surgeon: Medication Radiologist, MD;  Location: [place];  Service: Radiology;  Laterality: N/A;  . TOTAL KNEE ARTHROPLASTY     right. 2002    Social History   Social History  . Marital status: Married    Spouse name: N/A  . Number of children: N/A  . Years of education: N/A   Occupational History  . disabled    Social History Main Topics  . Smoking status: Never Smoker  . Smokeless tobacco: Never Used  . Alcohol use No     Comment: none since 03/2015  . Drug use: No  . Sexual activity: Not Currently    Birth control/ protection: Post-menopausal   Other Topics Concern  . 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 However he is fairly capable of taking care of himself at home. This couple have an attentive daughter who keeps an eye on both of them.    Allergies  Allergen Reactions  . Latex Swelling    Discoloration of skin.   . Vicodin [Hydrocodone-Acetaminophen] Itching    Family History  Problem Relation Age of Onset  . Aneurysm Mother   . Other Daughter     knee  replacement  . Hypertension Daughter     Prior to Admission medications   Medication Sig Start Date End Date Taking? Authorizing Provider  acetaminophen (TYLENOL) 325 MG tablet Take 325 mg by mouth 2 (two) times daily as needed for moderate pain.   Yes Historical Provider, MD  folic acid (FOLVITE) 1 MG tablet Take 1 tablet (1 mg total) by mouth daily. 04/29/15  Yes Rosita Fire, MD  lactulose (CHRONULAC) 10 GM/15ML solution Take 22.5 mLs (15 g total) by mouth 4 (four) times daily. Four times a day 02/29/16  Yes Rogene Houston, MD  rifaximin (XIFAXAN) 550 MG TABS tablet Take 550 mg by mouth 2 (two) times daily.   Yes Jacqulynn Cadet, MD  spironolactone (ALDACTONE) 25 MG tablet Take 1 tablet (25 mg total) by mouth daily. 04/25/16  Yes Rosita Fire, MD  thiamine 100 MG tablet Take 1 tablet (100 mg total) by mouth daily. 04/29/15  Yes Rosita Fire, MD  torsemide (DEMADEX) 20 MG tablet Take 30 mg by mouth daily.    Yes Historical Provider, MD    Physical Exam: Vitals:   10/29/16 1830 10/29/16 1848 10/29/16 1900 10/29/16 1930  BP: 125/80  126/86 115/64  Pulse: 64  62 (!) 59  Resp: (!) 29  (!) 30 18  Temp:      TempSrc:      SpO2: 100% 99% 99% 100%  Weight:      Height:         General: Chronically ill-appearing, appears very fatigued Eyes:  PERRL, EOMI, normal lids, iris ENT:  grossly normal hearing, lips & tongue, mmm Neck:  no LAD, masses or thyromegaly Cardiovascular:  RRR, no m/r/g. Marked bilateral pitting LE edema, 4+ and extending up to the thighs.  Respiratory:  CTA on the left, diminished breath sounds diffusely on the right, no w/r/r. Increased respiratory effort, tachypnea noted. Abdomen:  soft, ?fluid wave (vs. Abdominal obesity), diffusely tender, NABS Skin:  no rash or induration seen on limited exam Musculoskeletal:  grossly normal tone BUE/BLE, good ROM, no bony abnormality Psychiatric:  Blunted affect, speech fluent and appropriate, AOx3 Neurologic:  CN 2-12 grossly  intact, moves all extremities in coordinated fashion, sensation intact  Labs on Admission: I have personally reviewed following labs and imaging studies  CBC:  Recent Labs Lab 10/29/16 1217  WBC 18.8*  HGB 12.0  HCT 36.5  MCV 83.5  PLT 063   Basic Metabolic Panel:  Recent Labs Lab 10/29/16 1413  NA 138  K 3.9  CL 113*  CO2 19*  GLUCOSE 97  BUN 45*  CREATININE 2.28*  CALCIUM 7.8*   GFR: Estimated Creatinine Clearance: 28.8 mL/min (by C-G formula based on SCr of 2.28 mg/dL (H)). Liver Function Tests:  Recent Labs Lab 10/29/16 1413  AST 57*  ALT 19  ALKPHOS 157*  BILITOT 1.2  PROT 4.7*  ALBUMIN 1.2*   No results for input(s): LIPASE, AMYLASE in the last 168 hours. No results for input(s): AMMONIA in the last 168 hours. Coagulation Profile: No results for input(s): INR, PROTIME in the last 168 hours. Cardiac Enzymes: No results for input(s): CKTOTAL, CKMB, CKMBINDEX, TROPONINI in the last 168 hours. BNP (last 3 results) No results for input(s): PROBNP in the last 8760 hours. HbA1C: No results for input(s): HGBA1C in the last 72 hours. CBG: No results for input(s): GLUCAP in the last 168 hours. Lipid Profile: No results for input(s): CHOL, HDL, LDLCALC, TRIG, CHOLHDL, LDLDIRECT in the last 72 hours. Thyroid Function Tests: No results for input(s): TSH, T4TOTAL, FREET4, T3FREE, THYROIDAB in the last 72 hours. Anemia Panel: No results for input(s): VITAMINB12, FOLATE, FERRITIN, TIBC, IRON, RETICCTPCT in the last 72 hours. Urine analysis:    Component Value Date/Time   COLORURINE YELLOW 10/19/2015 1656   APPEARANCEUR CLEAR 10/19/2015 1656   LABSPEC 1.010 10/19/2015 1656   PHURINE 5.5 10/19/2015 1656   GLUCOSEU NEGATIVE 10/19/2015 1656   HGBUR SMALL (A) 10/19/2015 1656   BILIRUBINUR NEGATIVE 10/19/2015 1656   KETONESUR NEGATIVE 10/19/2015 1656   PROTEINUR TRACE (A) 10/19/2015 1656   UROBILINOGEN 0.2 08/30/2015 1642   NITRITE NEGATIVE 10/19/2015 1656    LEUKOCYTESUR NEGATIVE 10/19/2015 1656    Creatinine Clearance: Estimated Creatinine Clearance: 28.8 mL/min (by C-G formula based on SCr of 2.28 mg/dL (H)).  Sepsis Labs: '@LABRCNTIP'$ (procalcitonin:4,lacticidven:4) )No results found for this or any  previous visit (from the past 240 hour(s)).   Radiological Exams on Admission: Dg Chest 2 View  Result Date: 10/29/2016 CLINICAL DATA:  Shortness of breath and cough. History of cirrhosis and status post TIPS in September, 2016 to treat recurrent right hepatic hydrothorax. EXAM: CHEST  2 VIEW COMPARISON:  08/09/2016 FINDINGS: The heart size and mediastinal contours are within normal limits. Large right pleural effusion present. There is compressive atelectasis of the right lower lung. No edema or left pleural fluid. No airspace consolidation or pneumothorax. The visualized skeletal structures are unremarkable. IMPRESSION: Large right pleural effusion. Given prior clinical history, this likely represents recurrent hepatic hydrothorax. Electronically Signed   By: Aletta Edouard M.D.   On: 10/29/2016 13:21    EKG: Independently reviewed.  NSR with rate 67; Accelerated Junctional rhythm, nonspecific ST changes not markedly changed from prior EKG   Assessment/Plan Principal Problem:   Acute respiratory failure with hypoxia (HCC) Active Problems:   Cirrhosis, alcoholic (HCC)   Bilateral leg edema   Pleural effusion associated with hepatic disorder   Chronic kidney disease (CKD), stage IV (severe) (HCC)   Alcoholic cirrhosis of liver with ascites (HCC)   Anasarca   Hypoalbuminemia   Leukocytosis   Diastolic dysfunction with chronic heart failure (HCC)   Acute respiratory failure resulting from probable large recurrent hepatic hydrothorax -Patient will require admission to [facility] due to limited resources available at [facility] on a holiday weekend -With symptomatic SOB with this large effusion that is very likely a repeat hepatic hydrothorax, it  will require IR consultation tomorrow and likely large-volume (1L+) thoracentesis -If the patient has respiratory decompensation overnight, will need PCCM assistance and/or urgent IR consultation She has received a Duoneb with some improvement - although there really isn't a significant physiologic reason why this should help; will continue Duonebs/Albuterol prn -She does have leukocytosis and so the fluid does need to be sent for C&S in addition to other pertinent studies, but she does not have fever or other infectious signs/symptoms - she describes progressive symptoms for the last few months without an obvious acute change  Cirrhosis, alcoholic -Previous MELD score was roughly 15. -No INR done (yet) today, but with today's labs other than INR her current MELD score is up to 19, MELD-Na is 20, with a mortality risk of 6% -Will check INR -Her Alk Phos is elevated today to 157, which appears to be new -Hold torsemide (IV diuresis - see below) and continue spironolactone -If patient has not received 3rd Hep B vaccine, it is due and should be given. -Dr. Laural Golden raised the question of whether the TIPS is clotted off and leading to the recurrence of the pleural effusion; will defer to IR tomorrow about this issue, as well -Dr. Laural Golden is aware of and in agreement with the plan to transfer the patient to [facility] and is available as needed for questions -It is not clear on exam if she has marked ascites or not; this may need further evaluation although at this time it does not appear to be her most serious consideration -SBP is always a consideration but there is not a particularly high level of concern for this at this time due to her lack of general abdominal symptoms or fever  CKD Stage IV -Patient has not seen nephrology in [place] - her care has been in [facility] for this issue, to date -Creatinine 2.28 (2.25 08/09/16) -Will need nephrology consultation tomorrow  -Will order urine protein:creatinine  ratio in anticipation of  this consult -By review of her records, her CKD appears to have progressed to Stage IV but does appear to be stable at this time -She is markedly volume overloaded (see below) and will be diuresed but may need a Lasix drip if bolus Lasix is insufficient -She will need to return to [facility] for ongoing discussion about transplantation, as appropriate  Anasarca/Hypoalbuminemia -Due to her combined hepatic and renal dysfunction in conjunction with likely poor nutrition, she is markedly hypoalbuminemic -Will give albumin as well as Lasix q6h to begin to diurese her -If this is ineffective, she is likely to need a Lasix drip as she does appear to be markedly volume overloaded at this time  Grade 3 Diastolic Dysfunction -Diuresis with Lasix and ongoing use of Spironolactone -Her kidneys are likely to not tolerate addition of ACE/ARB -She is not taking a beta blocker and may benefit from this addition in the future (from a varices standpoint, as well)   DVT prophylaxis: SCDs for now in anticipation of thoracentesis tomorrow Code Status:  Full - confirmed with patient/family Family Communication: Husband present throughout evaluation  Disposition Plan:  Home once clinically improved Consults called: IR; needs nephrology tomorrow  Admission status: Admit - It is my clinical opinion that admission to INPATIENT is reasonable and necessary because this patient will require at least 2 midnights in the hospital to treat this condition based on the medical complexity of the problems presented.  Given the aforementioned information, the predictability of an adverse outcome is felt to be significant.    Karmen Bongo MD [facility]  If 7PM-7AM, please contact night-coverage [email or web address] Password [email or web address]  10/29/2016, 8:03 PM

## 2016-10-29 NOTE — ED Notes (Addendum)
Per phlebotomist unable to get blood work from L side. Pt reports she had lymph nodes removed 17 years ago from R side. Per MD Zammit, obtain lab sample only, no IV from R side at this time. Phlebotomist notified.

## 2016-10-29 NOTE — ED Notes (Signed)
MD Lorin Mercy notified pt is wheezing and has increased coughing, orders to be placed.

## 2016-10-29 NOTE — ED Provider Notes (Signed)
[facility] DEPT Provider Note   CSN: [id number] Arrival date & time: 10/29/16  1111  By signing my name below, I, [name], attest that this documentation has been prepared under the direction and in the presence of Milton Ferguson, MD. Electronically Signed: [name], Education administrator. 10/29/16. 12:16 PM.  History   Chief Complaint Chief Complaint  Patient presents with  . Shortness of Breath  . Chest Pain    The history is provided by the patient. No language interpreter was used.  Shortness of Breath  This is a recurrent problem. The average episode lasts 1 month. The problem occurs continuously.The current episode started more than 1 week ago. The problem has not changed since onset.Associated symptoms include cough, wheezing, chest pain and leg swelling.     HPI Comments: Savannah Jackson is a 60 y.o. female who presents to the Emergency Department complaining of 1 month of SOB and intermittent CP that worsened today. She also reports an associated, persistent cough. She states she was seen in [facility] when she had "fluid pulled off". Per nursing note, she denies any missed doses of her diuretic medications.   PCP Rehman  Past Medical History:  Diagnosis Date  . Bilateral leg edema   . Brain aneurysm   . Breast disorder    right breast cancer 2002  . Cancer [facility])    breast cancer  . Chronic back pain Diverticultis  . Cirrhosis ([place])    alcoholic  . DDD (degenerative disc disease), lumbar   . Diverticulosis    scope 2014  . ETOH abuse   . Hepatomegaly    scope 2014  . History of breast cancer 06/07/2016  . Hypertension   . Rotator cuff syndrome of left shoulder   . Sleep apnea   . Thickened endometrium 06/19/2016   Will get endo biopsy     Patient Active Problem List   Diagnosis Date Noted  . Thickened endometrium 06/19/2016  . PMB (postmenopausal bleeding) 06/07/2016  . History of breast cancer 06/07/2016  . Cough   . Esophageal varices ([place]) 12/21/2015  .  Alcoholic cirrhosis of liver without ascites ([place])   . Anemia of chronic disease   . Peripheral edema 11/26/2015  . Anasarca 11/26/2015  . Acute renal failure (ARF) ([place]) 10/19/2015  . Metabolic acidosis 09/38/1829  . UTI (lower urinary tract infection) 09/24/2015  . Delirium due to another medical condition 09/24/2015  . Acute renal injury ([place]) 09/24/2015  . Elevated troponin 09/24/2015  . Altered mental status 08/30/2015  . Hepatic encephalopathy ([place]) 08/30/2015  . Edema   . SOB (shortness of breath)   . Acute respiratory failure with hypoxia ([place])   . Recurrent right pleural effusion   . Alcoholic cirrhosis of liver with ascites ([place])   . Pleural effusion associated with hepatic disorder 07/03/2015  . Stage III chronic kidney disease 07/03/2015  . Elevated INR 07/03/2015  . Acute respiratory distress 07/03/2015  . BRBPR (bright red blood per rectum) 04/21/2015  . Lower extremity edema 04/21/2015  . Pleural effusion, right 04/21/2015  . Ascites 04/21/2015  . GI bleed 04/21/2015  . Anemia 04/21/2015  . Pleural effusion 04/21/2015  . Sleep apnea   . Chronic back pain   . ETOH abuse   . Bilateral leg edema   . Bleeding gastrointestinal   . Cirrhosis, alcoholic ([place]) 93/71/6967  . Abdominal pain, left lower quadrant 02/17/2013  . Diverticulitis of colon without hemorrhage 02/17/2013  . Hypertension     Past Surgical  History:  Procedure Laterality Date  . BRAIN SURGERY     aneurysm at [place]  . CATARACT EXTRACTION Bilateral    [facility] 2 or 3 years ago  . CHOLECYSTECTOMY    . COLONOSCOPY N/A 04/10/2013   Procedure: COLONOSCOPY;  Surgeon: Rogene Houston, MD;  [facility];  Service: Endoscopy;  Laterality: N/A;  1030-rescheduled to 1200 Ann notified pt  . ESOPHAGOGASTRODUODENOSCOPY N/A 04/22/2015   Procedure: ESOPHAGOGASTRODUODENOSCOPY (EGD);  Surgeon: Rogene Houston, MD;  [facility];  Service: Endoscopy;  Laterality:  N/A;  . RADIOLOGY WITH ANESTHESIA N/A 07/16/2015   Procedure: TIPS;  Surgeon: Medication Radiologist, MD;  Location: [place];  Service: Radiology;  Laterality: N/A;  . TOTAL KNEE ARTHROPLASTY     right. 2002    OB History    Gravida Para Term Preterm AB Living   1 1 1     1    SAB TAB Ectopic Multiple Live Births           1       Home Medications    Prior to Admission medications   Medication Sig Start Date End Date Taking? Authorizing Provider  acetaminophen (TYLENOL) 325 MG tablet Take 325 mg by mouth 2 (two) times daily as needed for moderate pain.    Historical Provider, MD  folic acid (FOLVITE) 1 MG tablet Take 1 tablet (1 mg total) by mouth daily. 04/29/15   Rosita Fire, MD  lactulose (CHRONULAC) 10 GM/15ML solution Take 22.5 mLs (15 g total) by mouth 4 (four) times daily. Four times a day 02/29/16   Rogene Houston, MD  PRESCRIPTION MEDICATION Inject 20 mcg into the muscle. Ths was a second injection of the Hep B vaccines - Engerix - B    Historical Provider, MD  rifaximin (XIFAXAN) 550 MG TABS tablet Take 550 mg by mouth 2 (two) times daily.    Jacqulynn Cadet, MD  spironolactone (ALDACTONE) 25 MG tablet Take 1 tablet (25 mg total) by mouth daily. 04/25/16   Rosita Fire, MD  thiamine 100 MG tablet Take 1 tablet (100 mg total) by mouth daily. 04/29/15   Rosita Fire, MD  torsemide (DEMADEX) 20 MG tablet Take 30 mg by mouth daily.     Historical Provider, MD    Family History Family History  Problem Relation Age of Onset  . Aneurysm Mother   . Other Daughter     knee replacement  . Hypertension Daughter     Social History Social History  Substance Use Topics  . Smoking status: Never Smoker  . Smokeless tobacco: Never Used  . Alcohol use No     Comment: none since 03/2015     Allergies   Latex and Vicodin [hydrocodone-acetaminophen]   Review of Systems Review of Systems  Respiratory: Positive for cough, shortness of breath and wheezing.   Cardiovascular:  Positive for chest pain and leg swelling.  All other systems reviewed and are negative.    Physical Exam Updated Vital Signs BP 147/77 (BP Location: Left Arm)   Pulse 66   Temp 97.7 F (36.5 C) (Oral)   Resp 16   Ht 5\' 3"  (1.6 m)   Wt 214 lb (97.1 kg)   SpO2 97%   BMI 37.91 kg/m   Physical Exam  Constitutional: She is oriented to person, place, and time. She appears well-developed.  HENT:  Head: Normocephalic.  Eyes: Conjunctivae and EOM are normal. No scleral  icterus.  Neck: Neck supple. No thyromegaly present.  Cardiovascular: Normal rate and regular rhythm.  Exam reveals no gallop and no friction rub.   No murmur heard. Pulmonary/Chest: No stridor. She has wheezes (moderate bilaterally). She has no rales. She exhibits no tenderness.  Abdominal: She exhibits no distension. There is no tenderness. There is no rebound.  Musculoskeletal: Normal range of motion. She exhibits edema (3+ to bilateral ankles).  Lymphadenopathy:    She has no cervical adenopathy.  Neurological: She is oriented to person, place, and time. She exhibits normal muscle tone. Coordination normal.  Skin: No rash noted. No erythema.  Psychiatric: She has a normal mood and affect. Her behavior is normal.  Nursing note and vitals reviewed.    ED Treatments / Results  DIAGNOSTIC STUDIES: Oxygen Saturation is 97% on RA, normal by my interpretation.    COORDINATION OF CARE: 12:17 PM Discussed treatment plan with pt at bedside and pt agreed to plan.   Labs (all labs ordered are listed, but only abnormal results are displayed) Labs Reviewed  BASIC METABOLIC PANEL  CBC  TROPONIN I    EKG  EKG Interpretation  Date/Time:  Sunday October 29 2016 11:13:00 EST Ventricular Rate:  67 PR Interval:    QRS Duration: 98 QT Interval:  394 QTC Calculation: 416 R Axis:     Text Interpretation:  Accelerated Junctional rhythm Cannot rule out Anterior infarct , age undetermined T wave abnormality, consider  lateral ischemia Abnormal ECG No significant change was found Confirmed by CAMPOS  MD, KEVIN ([id number]) on 10/29/2016 11:32:49 AM       Radiology No results found.  Procedures Procedures (including critical care time)  Medications Ordered in ED Medications - No data to display   Initial Impression / Assessment and Plan / ED Course  I have reviewed the triage vital signs and the nursing notes.  Pertinent labs & imaging results that were available during my care of the patient were reviewed by me and considered in my medical decision making (see chart for details).  Clinical Course     Patient with cirrhosis and large pleural effusion on the right. She will be admitted to medicine at [facility]. She will need to have the effusion tapped  Final Clinical Impressions(s) / ED Diagnoses   Final diagnoses:  None    New Prescriptions New Prescriptions   No medications on file   The chart was scribed for me under my direct supervision.  I personally performed the history, physical, and medical decision making and all procedures in the evaluation of this patient.Milton Ferguson, MD 10/29/16 [phone]

## 2016-10-29 NOTE — ED Notes (Signed)
Pt reports she has been SOB and having intermittent CP over 1 month, increasing in severity today. Abd is taught, and 3+ pitting edema to BLE, pt denies missing a dose of her fluid pill. Audible wheezing heard, orthopnea, tachypnea, decreased appetite due to fullness. Reports he has had to get "Fluid pulled off" in the past. MD [facility] notified.

## 2016-10-30 ENCOUNTER — Inpatient Hospital Stay (HOSPITAL_COMMUNITY): Payer: Commercial Managed Care - HMO

## 2016-10-30 ENCOUNTER — Encounter (HOSPITAL_COMMUNITY): Payer: Self-pay | Admitting: Nephrology

## 2016-10-30 DIAGNOSIS — K7031 Alcoholic cirrhosis of liver with ascites: Secondary | ICD-10-CM

## 2016-10-30 DIAGNOSIS — I8510 Secondary esophageal varices without bleeding: Secondary | ICD-10-CM

## 2016-10-30 DIAGNOSIS — R601 Generalized edema: Secondary | ICD-10-CM

## 2016-10-30 DIAGNOSIS — E8809 Other disorders of plasma-protein metabolism, not elsewhere classified: Secondary | ICD-10-CM

## 2016-10-30 DIAGNOSIS — N042 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: Secondary | ICD-10-CM

## 2016-10-30 DIAGNOSIS — N179 Acute kidney failure, unspecified: Secondary | ICD-10-CM

## 2016-10-30 DIAGNOSIS — D72829 Elevated white blood cell count, unspecified: Secondary | ICD-10-CM

## 2016-10-30 DIAGNOSIS — Z9889 Other specified postprocedural states: Secondary | ICD-10-CM

## 2016-10-30 DIAGNOSIS — K769 Liver disease, unspecified: Secondary | ICD-10-CM

## 2016-10-30 DIAGNOSIS — K7290 Hepatic failure, unspecified without coma: Secondary | ICD-10-CM

## 2016-10-30 DIAGNOSIS — J918 Pleural effusion in other conditions classified elsewhere: Secondary | ICD-10-CM

## 2016-10-30 DIAGNOSIS — N184 Chronic kidney disease, stage 4 (severe): Secondary | ICD-10-CM

## 2016-10-30 HISTORY — PX: LIVER TRANSPLANT: SHX410

## 2016-10-30 HISTORY — PX: KIDNEY TRANSPLANT: SHX239

## 2016-10-30 LAB — PROTIME-INR
INR: 1.51
PROTHROMBIN TIME: 18.4 s — AB (ref 11.4–15.2)

## 2016-10-30 LAB — COMPREHENSIVE METABOLIC PANEL
ALT: 16 U/L (ref 14–54)
ANION GAP: 9 (ref 5–15)
AST: 52 U/L — ABNORMAL HIGH (ref 15–41)
Albumin: 1.1 g/dL — ABNORMAL LOW (ref 3.5–5.0)
Alkaline Phosphatase: 129 U/L — ABNORMAL HIGH (ref 38–126)
BUN: 43 mg/dL — AB (ref 6–20)
CO2: 16 mmol/L — ABNORMAL LOW (ref 22–32)
Calcium: 7.7 mg/dL — ABNORMAL LOW (ref 8.9–10.3)
Chloride: 114 mmol/L — ABNORMAL HIGH (ref 101–111)
Creatinine, Ser: 2.46 mg/dL — ABNORMAL HIGH (ref 0.44–1.00)
GFR calc Af Amer: 23 mL/min — ABNORMAL LOW (ref >60)
GFR, EST NON AFRICAN AMERICAN: 20 mL/min — AB (ref >60)
Glucose, Bld: 84 mg/dL (ref 65–99)
POTASSIUM: 4.2 mmol/L (ref 3.5–5.1)
Sodium: 139 mmol/L (ref 135–145)
TOTAL PROTEIN: 3.9 g/dL — AB (ref 6.5–8.1)
Total Bilirubin: 1.1 mg/dL (ref 0.3–1.2)

## 2016-10-30 LAB — CBC
HCT: 29.6 % — ABNORMAL LOW (ref 36.0–46.0)
Hemoglobin: 10 g/dL — ABNORMAL LOW (ref 12.0–15.0)
MCH: 26.5 pg (ref 26.0–34.0)
MCHC: 33.8 g/dL (ref 30.0–36.0)
MCV: 78.3 fL (ref 78.0–100.0)
Platelets: 209 10*3/uL (ref 150–400)
RBC: 3.78 MIL/uL — ABNORMAL LOW (ref 3.87–5.11)
RDW: 16.3 % — AB (ref 11.5–15.5)
WBC: 18.3 10*3/uL — ABNORMAL HIGH (ref 4.0–10.5)

## 2016-10-30 LAB — GRAM STAIN

## 2016-10-30 LAB — BODY FLUID CELL COUNT WITH DIFFERENTIAL
EOS FL: 0 %
LYMPHS FL: 2 %
Monocyte-Macrophage-Serous Fluid: 8 % — ABNORMAL LOW (ref 50–90)
NEUTROPHIL FLUID: 90 % — AB (ref 0–25)
Total Nucleated Cell Count, Fluid: 3700 cu mm — ABNORMAL HIGH (ref 0–1000)

## 2016-10-30 LAB — PROCALCITONIN: Procalcitonin: 4.08 ng/mL

## 2016-10-30 MED ORDER — SPIRONOLACTONE 25 MG PO TABS: 25.0000 mg | ORAL_TABLET | Freq: Every day | ORAL | Status: DC

## 2016-10-30 MED ORDER — FUROSEMIDE 10 MG/ML IJ SOLN: 40.0000 mg | Freq: Four times a day (QID) | INTRAMUSCULAR | Status: DC

## 2016-10-30 MED ORDER — FUROSEMIDE 10 MG/ML IJ SOLN
40.0000 mg | Freq: Four times a day (QID) | INTRAMUSCULAR | Status: DC
Start: 2016-10-30 — End: 2016-10-31
  Administered 2016-10-30 – 2016-10-31 (×4): 40 mg via INTRAVENOUS
  Filled 2016-10-30 (×4): qty 4

## 2016-10-30 MED ORDER — IPRATROPIUM-ALBUTEROL 0.5-2.5 (3) MG/3ML IN SOLN
3.0000 mL | Freq: Four times a day (QID) | RESPIRATORY_TRACT | Status: DC
  Administered 2016-10-30: 3 mL via RESPIRATORY_TRACT
  Filled 2016-10-30: qty 3

## 2016-10-30 MED ORDER — LIDOCAINE HCL (PF) 1 % IJ SOLN
INTRAMUSCULAR | Status: AC
  Filled 2016-10-30: qty 10

## 2016-10-30 MED ORDER — IPRATROPIUM-ALBUTEROL 0.5-2.5 (3) MG/3ML IN SOLN: 3.0000 mL | Freq: Four times a day (QID) | RESPIRATORY_TRACT | Status: DC | PRN

## 2016-10-30 MED ORDER — TORSEMIDE 20 MG PO TABS: 30.0000 mg | ORAL_TABLET | Freq: Every day | ORAL | Status: DC

## 2016-10-30 NOTE — Consult Note (Signed)
Referring Provider: [facility]  Primary Care Physician:  Rosita Fire, MD Primary Gastroenterologist:   Hildred Laser,  MD  Reason for Consultation: cirrhosis / pleural effusion  HPI: Savannah Jackson is a 61 y.o. female with ETOH cirrhosis, followed at [facility].  Her transplant evaluation is Last seen at there 10/02/16 at which MELD was only 15. 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    Patient recently diagnosed with membranous glomerulopathy, followed by Renal at [facility]. Didn't tolerate Prednisone, being considered for Rituximab.   Patient has had progressive SOB over last few weeks. She reports cough, abdominal distention as well. She has been compliant with low salt diet and diuretics. She reports nausea without vomiting. No abdominal pain. Bowels moving on lactulose. No urinary symptoms.    Past Medical History:  Diagnosis Date  . Bilateral leg edema   . Brain aneurysm   . Breast disorder    right breast cancer 2002  . Cancer [facility])    breast cancer  . Chronic back pain Diverticultis  . Cirrhosis ([place])    alcoholic  . DDD (degenerative disc disease), lumbar   . Diverticulosis    scope 2014  . ETOH abuse   . Hepatomegaly    scope 2014  . History of breast cancer 06/07/2016  . Hypertension   . Rotator cuff syndrome of left shoulder   . Sleep apnea   . Thickened endometrium 06/19/2016   Will get endo biopsy     Past Surgical History:  Procedure Laterality Date  . BRAIN SURGERY     aneurysm at [place]  . CATARACT EXTRACTION Bilateral    [facility] 2 or 3 years ago  . CHOLECYSTECTOMY    . COLONOSCOPY N/A 04/10/2013   Procedure: COLONOSCOPY;  Surgeon: Rogene Houston, MD;  [facility];  Service: Endoscopy;  Laterality: N/A;  1030-rescheduled to 1200 Ann notified pt  . ESOPHAGOGASTRODUODENOSCOPY N/A 04/22/2015   Procedure:  ESOPHAGOGASTRODUODENOSCOPY (EGD);  Surgeon: Rogene Houston, MD;  [facility];  Service: Endoscopy;  Laterality: N/A;  . RADIOLOGY WITH ANESTHESIA N/A 07/16/2015   Procedure: TIPS;  Surgeon: Medication Radiologist, MD;  Location: [place];  Service: Radiology;  Laterality: N/A;  . TOTAL KNEE ARTHROPLASTY     right. 2002    Prior to Admission medications   Medication Sig Start Date End Date Taking? Authorizing Provider  acetaminophen (TYLENOL) 325 MG tablet Take 325 mg by mouth 2 (two) times daily as needed for moderate pain.   Yes Historical Provider, MD  folic acid (FOLVITE) 1 MG tablet Take 1 tablet (1 mg total) by mouth daily. 04/29/15  Yes Rosita Fire, MD  lactulose (CHRONULAC) 10 GM/15ML solution Take 22.5 mLs (15 g total) by mouth 4 (four) times daily. Four times a day 02/29/16  Yes Rogene Houston, MD  rifaximin (XIFAXAN) 550 MG TABS tablet Take 550 mg by mouth 2 (two) times daily.   Yes Jacqulynn Cadet, MD  spironolactone (ALDACTONE) 25 MG tablet Take 1 tablet (25 mg total) by mouth daily. 04/25/16  Yes Rosita Fire, MD  thiamine 100 MG tablet Take 1 tablet (100 mg total) by mouth daily. 04/29/15  Yes Rosita Fire, MD  torsemide (DEMADEX) 20 MG tablet Take 30 mg by mouth daily.    Yes Historical Provider, MD    Current Facility-Administered Medications  Medication Dose Route Frequency  Provider Last Rate Last Dose  . acetaminophen (TYLENOL) tablet 650 mg  650 mg Oral Q6H PRN Karmen Bongo, MD   650 mg at 10/30/16 1032   Or  . acetaminophen (TYLENOL) suppository 650 mg  650 mg Rectal Q6H PRN Karmen Bongo, MD      . albuterol (PROVENTIL) (2.5 MG/3ML) 0.083% nebulizer solution 2.5 mg  2.5 mg Nebulization Q2H PRN Karmen Bongo, MD      . folic acid (FOLVITE) tablet 1 mg  1 mg Oral Daily Karmen Bongo, MD   1 mg at 10/30/16 0829  . [START ON 10/31/2016] furosemide (LASIX) injection 40 mg  40 mg Intravenous Q6H Anand D Hongalgi, MD      . ipratropium-albuterol (DUONEB) 0.5-2.5  (3) MG/3ML nebulizer solution 3 mL  3 mL Nebulization Q6H PRN Brand Males, MD      . lactulose (CHRONULAC) 10 GM/15ML solution 15 g  15 g Oral QID Karmen Bongo, MD   15 g at 10/30/16 0830  . morphine 2 MG/ML injection 2 mg  2 mg Intravenous Q2H PRN Karmen Bongo, MD      . ondansetron [facility]) tablet 4 mg  4 mg Oral Q6H PRN Karmen Bongo, MD       Or  . ondansetron [facility]) injection 4 mg  4 mg Intravenous Q6H PRN Karmen Bongo, MD      . rifaximin Doreene Nest) tablet 550 mg  550 mg Oral BID Karmen Bongo, MD   550 mg at 10/30/16 0829  . [START ON 10/31/2016] spironolactone (ALDACTONE) tablet 25 mg  25 mg Oral Daily Modena Jansky, MD      . thiamine (VITAMIN B-1) tablet 100 mg  100 mg Oral Daily Karmen Bongo, MD   100 mg at 10/30/16 0829  . [START ON 10/31/2016] torsemide (DEMADEX) tablet 30 mg  30 mg Oral Daily Modena Jansky, MD        Allergies as of 10/29/2016 - Review Complete 10/29/2016  Allergen Reaction Noted  . Latex Swelling 07/15/2012  . Vicodin [hydrocodone-acetaminophen] Itching 09/06/2012    Family History  Problem Relation Age of Onset  . Aneurysm Mother   . Other Daughter     knee replacement  . Hypertension Daughter     Social History   Social History  . Marital status: Married    Spouse name: N/A  . Number of children: N/A  . Years of education: N/A   Occupational History  . disabled    Social History Main Topics  . Smoking status: Never Smoker  . Smokeless tobacco: Never Used  . Alcohol use No     Comment: none since 03/2015  . Drug use: No  . Sexual activity: Not Currently    Birth control/ protection: Post-menopausal   Other Topics Concern  . 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 However he is fairly capable of taking care of himself at home. This couple have an attentive daughter who keeps an eye on both of them.    Review of Systems: All  systems reviewed and negative except where noted in HPI.  Physical Exam: Vital signs in last 24 hours: Temp:  [97.4 F (36.3 C)-97.9 F (36.6 C)] 97.9 F (36.6 C) (01/01 0549) Pulse Rate:  [58-74] 69 (01/01 0549) Resp:  [16-30] 18 (01/01 0549) BP: (107-154)/(47-90) 112/47 (01/01 0549) SpO2:  [91 %-100 %] 100 % (01/01 0549) Weight:  [214 lb (  97.1 kg)] 214 lb (97.1 kg) (12/31 1115) Last BM Date: 10/30/16 General:   Alert,  Obese black female with significantly labored breathing. She is in process of being transported for thoracentesis.  Head:  Normocephalic and atraumatic. Eyes:  Sclera clear, no icterus.   Conjunctiva pink. Ears:  Normal auditory acuity. Nose:  No deformity, discharge,  or lesions. Mouth:  No deformity or lesions.   Neck:  Supple; no masses Lungs:  Diffuse wheezing. Decreased breath sounds in RLL Cardiac:  Regular rate and rhythm; no murmurs. Anasarca Abdomen:  Soft,mildly distended, nontender, BS active. Abdominal wall edema Rectal:  Deferred  Msk:  Symmetrical without gross deformities. . Pulses:  Normal pulses noted. Extremities:  BLE pitting edema. Neurologic:  Alert and  oriented x4;  grossly normal neurologically. Skin:  Intact without significant lesions or rashes.. Psych:  Alert and cooperative. Normal mood and affect.  Intake/Output from previous day: 12/31 0701 - 01/01 0700 In: 75 [P.O.:75] Out: -  Intake/Output this shift: No intake/output data recorded.  Lab Results:  Recent Labs  10/29/16 1217 10/30/16 0447  WBC 18.8* 18.3*  HGB 12.0 10.0*  HCT 36.5 29.6*  PLT 175 209   BMET  Recent Labs  10/29/16 1413 10/30/16 0447  NA 138 139  K 3.9 4.2  CL 113* 114*  CO2 19* 16*  GLUCOSE 97 84  BUN 45* 43*  CREATININE 2.28* 2.46*  CALCIUM 7.8* 7.7*   LFT  Recent Labs  10/30/16 0447  PROT 3.9*  ALBUMIN 1.1*  AST 52*  ALT 16  ALKPHOS 129*  BILITOT 1.1   PT/INR  Recent Labs  10/30/16 0447  LABPROT 18.4*  INR 1.51     Studies/Results: Dg Chest 2 View  Result Date: 10/29/2016 CLINICAL DATA:  Shortness of breath and cough. History of cirrhosis and status post TIPS in September, 2016 to treat recurrent right hepatic hydrothorax. EXAM: CHEST  2 VIEW COMPARISON:  08/09/2016 FINDINGS: The heart size and mediastinal contours are within normal limits. Large right pleural effusion present. There is compressive atelectasis of the right lower lung. No edema or left pleural fluid. No airspace consolidation or pneumothorax. The visualized skeletal structures are unremarkable. IMPRESSION: Large right pleural effusion. Given prior clinical history, this likely represents recurrent hepatic hydrothorax. Electronically Signed   By: Aletta Edouard M.D.   On: 10/29/2016 13:21    IMPRESSION / PLAN:   59. 61 yo female with ETOH cirrhosis complicated by hepatic encephalopathy, non-bleeding esophageal varices, ascites / hydrothorax s/p TIPS Sept 2106 with subsequent balloon angioplasty of shunt after developing hydrothorax..  She is followed by [facility] and at time of last not 10/02/16 evaluation was still on hold due to inability to obtain cross sectional imaging, renal dysfunction and psychosocial concerns. MELD 20 -INR 1.51, LFTs at baseline. Not encephalopathic at present. Continue Xifaxan and lactulose. Sodium normal at 139.  Unfortunately she does have progressive renal failure  2. Respiratory distress . 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  -Headed down now for therapeutic thoracentesis / ultrasound with doppler to assess TIPS patency.  -on 02 per [place] -further recommendation to follow after TIPS evaluated  3. Volume overload / anasarca in setting of cirrhosis /,severe hypoalbuminemia, diastolic heart failure, progressive renal failure. Suspect she has some ascites. No abdominal pain. Can get u/s of abdomen later, current focus is on breathing / hydrothorax.  -Nephrology is evaluating. As for now,  she is being diuresed. -monitor I+O  4. AKI superimposed on CKD. Recently diagnosed  with membranous glomerulopathy,Cr  1.8 early October --> 2.25 mid October ---> 2.46 today.    5. Leukocytosis, WBC 18.   5. Severe hypoalbuminemia, albumin 1.1.  Covering for Dr. Collene Mares and Dr. Benson Norway today.    Tye Savoy  10/30/2016, 11:07 AM  Pager number [phone]

## 2016-10-30 NOTE — Procedures (Addendum)
Ultrasound-guided diagnostic and therapeutic right thoracentesis performed yielding 2.2 liters of hazy, light yellow  fluid. No immediate complications. Follow-up chest x-ray pending. A portion of the fluid was submitted to the lab for preordered studies. Due to persistent pt coughing only the above amount of fluid was removed at this time. Follow-up TIPS ultrasound has been ordered to assess patency.

## 2016-10-30 NOTE — Consult Note (Signed)
Reason for Consult:AKI/CKD Referring Physician: Algis Liming, MD  Savannah Jackson is an 61 y.o. female.  HPI: Savannah Jackson has a very complicated past medical history most notable for alcoholic cirrhosis (last drink over 1 year ago and followed at [facility] Transplant hepatology), h/o breast cancer, HTN, OSA, and CKD which has been progressive and is followed by [facility] Nephrology.  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  She was supposed to start rituximab infusions, however this has not yet been arranged.  She presented to an outside hospital with worsening SOB, chest pain radiating to right shoulder and cough.  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  She also had a TIPS on 08/29/16 and has MGUS (IgG lambda) but no evidence of renal involvement on biopsy in November.  We were asked to help evaluate her AKI/CKD.  The trend in Scr is seen below.  Her Scr was 1.69 on 10/02/16 at [facility] and appears to have been ranging from 1.7-2.3 over the last 2 months.   Trend in Creatinine:  Creatinine, Ser  Date/Time Value Ref Range Status  10/30/2016 04:47 AM 2.46 (H) 0.44 - 1.00 mg/dL Final  10/29/2016 02:13 PM 2.28 (H) 0.44 - 1.00 mg/dL Final  08/09/2016 03:01 PM 2.25 (H) 0.44 - 1.00 mg/dL Final  06/26/2016 01:00 PM 1.72 (H) 0.44 - 1.00 mg/dL Final  04/25/2016 04:59 AM 2.06 (H) 0.44 - 1.00 mg/dL Final  04/24/2016 08:19 AM 1.95 (H) 0.44 - 1.00 mg/dL Final  04/23/2016 05:52 AM 1.96 (H) 0.44 - 1.00 mg/dL Final  04/22/2016 05:59 AM 1.73 (H) 0.44 - 1.00 mg/dL Final  04/21/2016 05:22 AM 1.79 (H) 0.44 - 1.00 mg/dL Final  04/20/2016 05:16 AM 1.60 (H) 0.44 - 1.00 mg/dL Final  04/19/2016 04:26 AM 1.36 (H) 0.44 - 1.00 mg/dL Final  04/18/2016 09:13 PM 1.50 (H) 0.44 - 1.00 mg/dL Final  12/14/2015 12:18 PM 1.39 (H) 0.44 - 1.00 mg/dL Final   12/01/2015 06:44 AM 1.66 (H) 0.44 - 1.00 mg/dL Final  11/30/2015 05:20 AM 1.67 (H) 0.44 - 1.00 mg/dL Final  11/29/2015 06:46 AM 1.75 (H) 0.44 - 1.00 mg/dL Final  11/28/2015 04:38 AM 1.48 (H) 0.44 - 1.00 mg/dL Final  11/27/2015 06:39 AM 1.59 (H) 0.44 - 1.00 mg/dL Final  11/26/2015 10:16 AM 1.50 (H) 0.44 - 1.00 mg/dL Final  11/11/2015 12:15 PM 1.67 (H) 0.44 - 1.00 mg/dL Final  11/09/2015 10:00 AM 1.82 (H) 0.44 - 1.00 mg/dL Final  10/22/2015 05:09 AM 1.71 (H) 0.44 - 1.00 mg/dL Final  10/21/2015 05:11 AM 1.32 (H) 0.44 - 1.00 mg/dL Final  10/20/2015 02:15 AM 1.73 (H) 0.44 - 1.00 mg/dL Final  10/19/2015 02:42 PM 2.08 (H) 0.44 - 1.00 mg/dL Final  09/27/2015 01:54 AM 0.87 0.44 - 1.00 mg/dL Final  09/26/2015 05:01 AM 0.98 0.44 - 1.00 mg/dL Final  09/25/2015 04:20 AM 1.09 (H) 0.44 - 1.00 mg/dL Final  09/24/2015 05:15 PM 1.44 (H) 0.44 - 1.00 mg/dL Final  08/31/2015 06:31 AM 1.16 (H) 0.44 - 1.00 mg/dL Final  08/30/2015 01:44 PM 1.26 (H) 0.44 - 1.00 mg/dL Final  07/18/2015 05:09 AM 1.62 (H) 0.44 - 1.00 mg/dL Final  07/17/2015 09:40 AM 1.57 (H) 0.44 - 1.00 mg/dL Final  07/16/2015 07:24 AM 1.50 (H) 0.44 - 1.00 mg/dL Final  07/15/2015 05:10 AM 1.23 (H) 0.44 - 1.00 mg/dL Final  07/14/2015 05:45  AM 1.34 (H) 0.44 - 1.00 mg/dL Final  07/13/2015 10:05 AM 1.47 (H) 0.44 - 1.00 mg/dL Final  07/12/2015 05:21 AM 1.60 (H) 0.44 - 1.00 mg/dL Final  07/10/2015 04:41 AM 1.32 (H) 0.44 - 1.00 mg/dL Final  07/09/2015 07:10 AM 1.39 (H) 0.44 - 1.00 mg/dL Final  07/08/2015 06:06 PM 1.52 (H) 0.44 - 1.00 mg/dL Final  07/08/2015 08:11 AM 1.50 (H) 0.44 - 1.00 mg/dL Final  07/07/2015 05:45 AM 1.48 (H) 0.44 - 1.00 mg/dL Final  07/06/2015 06:36 AM 1.49 (H) 0.44 - 1.00 mg/dL Final  07/05/2015 05:10 AM 1.51 (H) 0.44 - 1.00 mg/dL Final  07/04/2015 08:53 AM 1.20 (H) 0.44 - 1.00 mg/dL Final  07/03/2015 01:30 PM 1.44 (H) 0.44 - 1.00 mg/dL Final  05/21/2015 12:30 PM 1.16 (H) 0.44 - 1.00 mg/dL Final  04/29/2015 06:08 AM 1.39 (H)  0.44 - 1.00 mg/dL Final  04/27/2015 06:34 AM 1.47 (H) 0.44 - 1.00 mg/dL Final  04/26/2015 06:42 AM 1.54 (H) 0.44 - 1.00 mg/dL Final  04/24/2015 12:55 AM 1.28 (H) 0.44 - 1.00 mg/dL Final    PMH:   Past Medical History:  Diagnosis Date  . Bilateral leg edema   . Brain aneurysm   . Breast disorder    right breast cancer 2002  . Cancer [facility])    breast cancer  . Chronic back pain Diverticultis  . Chronic kidney disease (CKD) stage G3b/A3, moderately decreased glomerular filtration rate (GFR) between 30-44 mL/min/1.73 square meter and albuminuria creatinine ratio greater than 300 mg/g 08/30/2016  . Cirrhosis ([place])    alcoholic  . DDD (degenerative disc disease), lumbar   . Diverticulosis    scope 2014  . ETOH abuse   . Hepatomegaly    scope 2014  . History of breast cancer 06/07/2016  . Hypertension   . 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 Rotator cuff syndrome of left shoulder   . Sleep apnea   . Thickened endometrium 06/19/2016   Will get endo biopsy     PSH:   Past Surgical History:  Procedure Laterality Date  . BRAIN SURGERY     aneurysm at [place]  . CATARACT EXTRACTION Bilateral    [facility] 2 or 3 years ago  . CHOLECYSTECTOMY    . COLONOSCOPY N/A 04/10/2013   Procedure: COLONOSCOPY;  Surgeon: Rogene Houston, MD;  [facility];  Service: Endoscopy;  Laterality: N/A;  1030-rescheduled to 1200 Ann notified pt  . ESOPHAGOGASTRODUODENOSCOPY N/A 04/22/2015   Procedure: ESOPHAGOGASTRODUODENOSCOPY (EGD);  Surgeon: Rogene Houston, MD;  [facility];  Service: Endoscopy;  Laterality: N/A;  . RADIOLOGY WITH ANESTHESIA N/A 07/16/2015   Procedure: TIPS;  Surgeon: Medication Radiologist, MD;  Location: [place];  Service: Radiology;  Laterality: N/A;  . TOTAL KNEE ARTHROPLASTY     right. 2002    Allergies:  Allergies  Allergen Reactions  . Latex  Swelling    Discoloration of skin.   . Vicodin [Hydrocodone-Acetaminophen] Itching    Medications:   Prior to Admission medications   Medication Sig Start Date End Date Taking? Authorizing Provider  acetaminophen (TYLENOL) 325 MG tablet Take 325 mg by mouth 2 (two) times daily as needed for moderate pain.   Yes Historical Provider, MD  folic acid (FOLVITE) 1 MG tablet Take 1 tablet (1 mg total) by mouth daily. 04/29/15  Yes Rosita Fire, MD  lactulose (CHRONULAC) 10 GM/15ML solution  Take 22.5 mLs (15 g total) by mouth 4 (four) times daily. Four times a day 02/29/16  Yes Rogene Houston, MD  rifaximin (XIFAXAN) 550 MG TABS tablet Take 550 mg by mouth 2 (two) times daily.   Yes Jacqulynn Cadet, MD  spironolactone (ALDACTONE) 25 MG tablet Take 1 tablet (25 mg total) by mouth daily. 04/25/16  Yes Rosita Fire, MD  thiamine 100 MG tablet Take 1 tablet (100 mg total) by mouth daily. 04/29/15  Yes Rosita Fire, MD  torsemide (DEMADEX) 20 MG tablet Take 30 mg by mouth daily.    Yes Historical Provider, MD    Inpatient medications: . folic acid  1 mg Oral Daily  . [START ON 10/31/2016] furosemide  40 mg Intravenous Q6H  . lactulose  15 g Oral QID  . rifaximin  550 mg Oral BID  . [START ON 10/31/2016] spironolactone  25 mg Oral Daily  . thiamine  100 mg Oral Daily  . [START ON 10/31/2016] torsemide  30 mg Oral Daily    Discontinued Meds:   Medications Discontinued During This Encounter  Medication Reason  . PRESCRIPTION MEDICATION Completed Course  . ipratropium-albuterol (DUONEB) 0.5-2.5 (3) MG/3ML nebulizer solution 3 mL   . spironolactone (ALDACTONE) tablet 25 mg   . torsemide (DEMADEX) tablet 30 mg   . furosemide (LASIX) injection 40 mg     Social History:  reports that she has never smoked. She has never used smokeless tobacco. She reports that she does not drink alcohol or use drugs.  Family History:   Family History  Problem Relation Age of Onset  . Aneurysm Mother   . Other Daughter      knee replacement  . Hypertension Daughter     Pertinent items are noted in HPI. Weight change:   Intake/Output Summary (Last 24 hours) at 10/30/16 1111 Last data filed at 10/29/16 1800  Gross per 24 hour  Intake               75 ml  Output                0 ml  Net               75 ml   BP (!) 112/47 (BP Location: Left Arm)   Pulse 69   Temp 97.9 F (36.6 C) (Oral)   Resp 18   Ht 5\' 3"  (1.6 m)   Wt 97.1 kg (214 lb)   SpO2 100%   BMI 37.91 kg/m  Vitals:   10/29/16 2209 10/29/16 2232 10/30/16 0103 10/30/16 0549  BP: 114/75 125/68 107/60 (!) 112/47  Pulse: 63 62 66 69  Resp: 17 17 17 18   Temp: 97.8 F (36.6 C)  97.6 F (36.4 C) 97.9 F (36.6 C)  TempSrc: Oral  Oral Oral  SpO2: 100% 97% 97% 100%  Weight:      Height:         General appearance: alert, fatigued and moderately obese Head: Normocephalic, without obvious abnormality, atraumatic Resp: decreased BS throughout right lung field and at left base Cardio: regular rate and rhythm and no rub GI: obese, +BS, soft, +anasarca Extremities: edema 3+ with anasarca  Labs: Basic Metabolic Panel:  Recent Labs Lab 10/29/16 1413 10/30/16 0447  NA 138 139  K 3.9 4.2  CL 113* 114*  CO2 19* 16*  GLUCOSE 97 84  BUN 45* 43*  CREATININE 2.28* 2.46*  ALBUMIN 1.2* 1.1*  CALCIUM 7.8* 7.7*   Liver Function Tests:  Recent Labs Lab 10/29/16 1413 10/30/16 0447  AST 57* 52*  ALT 19 16  ALKPHOS 157* 129*  BILITOT 1.2 1.1  PROT 4.7* 3.9*  ALBUMIN 1.2* 1.1*   No results for input(s): LIPASE, AMYLASE in the last 168 hours. No results for input(s): AMMONIA in the last 168 hours. CBC:  Recent Labs Lab 10/29/16 1217 10/30/16 0447  WBC 18.8* 18.3*  HGB 12.0 10.0*  HCT 36.5 29.6*  MCV 83.5 78.3  PLT 175 209   PT/INR: @LABRCNTIP (inr:5) Cardiac Enzymes: )No results for input(s): CKTOTAL, CKMB, CKMBINDEX, TROPONINI in the last 168 hours. CBG: No results for input(s): GLUCAP in the last 168 hours.  Iron  Studies: No results for input(s): IRON, TIBC, TRANSFERRIN, FERRITIN in the last 168 hours.  Xrays/Other Studies: Dg Chest 2 View  Result Date: 10/29/2016 CLINICAL DATA:  Shortness of breath and cough. History of cirrhosis and status post TIPS in September, 2016 to treat recurrent right hepatic hydrothorax. EXAM: CHEST  2 VIEW COMPARISON:  08/09/2016 FINDINGS: The heart size and mediastinal contours are within normal limits. Large right pleural effusion present. There is compressive atelectasis of the right lower lung. No edema or left pleural fluid. No airspace consolidation or pneumothorax. The visualized skeletal structures are unremarkable. IMPRESSION: Large right pleural effusion. Given prior clinical history, this likely represents recurrent hepatic hydrothorax. Electronically Signed   By: Aletta Edouard M.D.   On: 10/29/2016 13:21     Assessment/Plan: 1.  AKI/CKD- in setting of nephrotic syndrome and cirrhosis with underlying membranous GN and diuresis.  Agree with holding aldactone but would restart torsemide due to massive edema and volume overload. 2. Membranous Nephropathy seen on biopsy at [facility] in November 2017 with plans for rituximab, however given her current condition and leukocytosis would hold off on any immunosuppressive therapy at this time. 3. Cirrhosis with anasarca- further complicated by diastolic chf and nephrotic syndrome from membranous GN.  Continue with diuresis, xifaxan and lactulose 4. Recurrent right pleural effusion- agree with IR consult for thoracentesis.  Also need to evaluate patency of TIPS 5. Disposition- unclear why patient was transferred here as she has her care directed at [facility].  Given the complexity of her medical history and the need to start immunosuppressive agents, would recommend transferring her to [facility] so she can have her primary hepatologist and nephrologist direct her care. 6. Anemia of chronic disease- need to follow H/H and make sure she does not  have an active GI bleed (hgb has dropped from 12->10) 7. Leukocytosis- need to r/o infection.     Broadus John A Johnda Billiot 10/30/2016, 11:11 AM

## 2016-10-30 NOTE — Progress Notes (Addendum)
PROGRESS NOTE  Savannah Jackson  HRC:[id number] DOB: [birth year]  DOA: 10/29/2016 PCP: Rosita Fire, MD  Consultants:  Park Liter; [facility] Transplant Hepatologist - Dorthula Perfect - Nephrology at [facility]; Laurence Ferrari at [facility] (for TIPS, 07/16/15)  Brief Narrative:  61 year old female patient with PMH of alcoholic cirrhosis, is on liver transplant list (transplant evaluation phase due to relatively low MELD score) and is followed by [facility] transplant hepatology, TIPS in 2016 and required balloon angioplasty of an occluded TIPS after developing a hepatic hydrothorax, progressive CKD followed by [facility] Nephrology, recently diagnosed with membranous glomerulopathy-did not tolerate prednisone and being considered for rituximab, breast cancer, HTN, OSA, initially presented to [facility] with >1 month history of dyspnea, nonproductive cough and chest pain which worsened over the last week. Admitted to [facility] 10/27-11/2 for similar issue. She has had prior therapeutic right thoracentesis. She was transferred to [facility] for therapeutic right thoracentesis by IR. IR, nephrology and GI consulted.   Assessment & Plan:   Principal Problem:   Acute respiratory failure with hypoxia (HCC) Active Problems:   Cirrhosis, alcoholic (HCC)   Bilateral leg edema   Pleural effusion associated with hepatic disorder   Chronic kidney disease (CKD), stage IV (severe) (HCC)   Alcoholic cirrhosis of liver with ascites (HCC)   Anasarca   Hypoalbuminemia   Leukocytosis   Diastolic dysfunction with chronic heart failure (HCC)   Membranous glomerulonephritis   Chronic kidney disease (CKD) stage G3b/A3, moderately decreased glomerular filtration rate (GFR) between 30-44 mL/min/1.73 square meter and albuminuria creatinine ratio greater than 300 mg/g   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 Recurrent right pleural effusion/hepatic hydrothorax/acute respiratory failure: This is in the context of generalized  volume overload from cirrhosis and nephrotic syndrome. Status post 2.2 L of therapeutic and diagnostic thoracentesis by IR. Follow outstanding results. Diuretics. GI/IR evaluating patency of TIPS. 2. Anasarca: Secondary to cirrhosis, nephrotic syndrome from membranous glomerulonephritis and diastolic CHF. As discussed with nephrology, continue IV Lasix but hold Aldactone and Demadex due to concern for worsening of renal functions. 3. Acute on chronic kidney disease stage IV, secondary to membranous GN/non-anion gap metabolic acidosis: Nephrology consultation appreciated. Serum creatinine ranging between 1.7-2.3 over the last 2 months. Biopsy confirmed at [facility] in November 2017. Apparently did not tolerate prednisone. Plans are for rituximab but given her current condition and leukocytosis, holding off on any immunosuppressants at this time. Diuretic management as above. Follow BMP in a.m. 4. Alcoholic cirrhosis complicated by hepatic encephalopathy, nonbleeding esophageal varices, ascites, hepatic hydrothorax - recurrent s/p TIPS: [place] [facility] consultation appreciated. She is followed by [facility] transplant team and as of 10/02/16, evaluation was still on hold due to inability to obtain cross-sectional imaging, renal dysfunction and psychosocial concerns. Clinically not encephalopathic. Continue Xifaxan and lactulose. 5. Anemia of chronic disease: Follow CBCs and transfuse if hemoglobin <7 g per DL. 6. Leukocytosis: No symptoms suggestive of infection. No cough, abdominal pain or fevers. However monitor closely and consider antibiotics if any decline. Check and trend pro-calcitonin. 7. Essential hypertension: Controlled. 8. Severe hypoalbuminemia: Secondary to cirrhosis and nephrotic syndrome.   DVT prophylaxis: SCD's Code Status: Full Family Communication: None at bedside Disposition Plan: DC home when medically stable.   Consultants:   Interventional radiology  Nephrology: Discussed with MD >monitor  closely and if she does not improve in the next 24-48 hours or declines, may need to consider transferring her to [facility].  GI ([place] from 10/31/16)  Procedures:   Diagnostic  and therapeutic ultrasound-guided right thoracentesis by IR on 1/1 yielding 2.2 L of fluid.  Antimicrobials:   None    Subjective: Seen this morning prior to thoracentesis. Stated that she felt slightly better. Dyspnea was slightly improved. Denied cough, chest pain, fever, chills, nausea, vomiting. Has 4-5 loose stools from lactulose. Chronic abdominal distention without acute worsening and no abdominal pain. States that she used to weigh 168 pounds a year ago and currently is 214 pounds.  Objective:  Vitals:   10/30/16 1212 10/30/16 1225 10/30/16 1230 10/30/16 1521  BP: 111/69 100/63 (!) 105/56 100/61  Pulse:    70  Resp:    17  Temp:    97.6 F (36.4 C)  TempSrc:    Oral  SpO2:    100%  Weight:      Height:        Intake/Output Summary (Last 24 hours) at 10/30/16 1656 Last data filed at 10/29/16 1800  Gross per 24 hour  Intake               75 ml  Output                0 ml  Net               75 ml   Filed Weights   10/29/16 1115  Weight: 97.1 kg (214 lb)    Examination:  General exam: Pleasant middle-aged female lying propped up in bed with mild intermittent dyspnea this morning. Respiratory system: Decreased breath sounds in the right lung fields. Clear to auscultation left lung fields. Intermittent mild dyspnea on exertion. Cardiovascular system: S1 & S2 heard, RRR. No JVD, murmurs, rubs, gallops or clicks. 3+ pedal edema. Gastrointestinal system: Abdomen is moderately distended but not tense, soft and nontender. No organomegaly or masses felt. Normal bowel sounds heard. Ascites + + Central nervous system: Alert and oriented. No focal neurological deficits. Extremities: Symmetric 5 x 5 power. Skin: No rashes, lesions or ulcers Psychiatry: Judgement and insight appear  normal. Mood & affect appropriate.     Data Reviewed: I have personally reviewed following labs and imaging studies  CBC:  Recent Labs Lab 10/29/16 1217 10/30/16 0447  WBC 18.8* 18.3*  HGB 12.0 10.0*  HCT 36.5 29.6*  MCV 83.5 78.3  PLT 175 267   Basic Metabolic Panel:  Recent Labs Lab 10/29/16 1413 10/30/16 0447  NA 138 139  K 3.9 4.2  CL 113* 114*  CO2 19* 16*  GLUCOSE 97 84  BUN 45* 43*  CREATININE 2.28* 2.46*  CALCIUM 7.8* 7.7*   GFR: Estimated Creatinine Clearance: 26.7 mL/min (by C-G formula based on SCr of 2.46 mg/dL (H)). Liver Function Tests:  Recent Labs Lab 10/29/16 1413 10/30/16 0447  AST 57* 52*  ALT 19 16  ALKPHOS 157* 129*  BILITOT 1.2 1.1  PROT 4.7* 3.9*  ALBUMIN 1.2* 1.1*   No results for input(s): LIPASE, AMYLASE in the last 168 hours. No results for input(s): AMMONIA in the last 168 hours. Coagulation Profile:  Recent Labs Lab 10/30/16 0447  INR 1.51   Cardiac Enzymes: No results for input(s): CKTOTAL, CKMB, CKMBINDEX, TROPONINI in the last 168 hours. BNP (last 3 results) No results for input(s): PROBNP in the last 8760 hours. HbA1C: No results for input(s): HGBA1C in the last 72 hours. CBG: No results for input(s): GLUCAP in the last 168 hours. Lipid Profile: No results for input(s): CHOL, HDL, LDLCALC, TRIG, CHOLHDL, LDLDIRECT in the last 72 hours. Thyroid  Function Tests: No results for input(s): TSH, T4TOTAL, FREET4, T3FREE, THYROIDAB in the last 72 hours. Anemia Panel: No results for input(s): VITAMINB12, FOLATE, FERRITIN, TIBC, IRON, RETICCTPCT in the last 72 hours.  Sepsis Labs: No results for input(s): PROCALCITON, LATICACIDVEN in the last 168 hours.  Recent Results (from the past 240 hour(s))  Gram stain     Status: None   Collection Time: 10/30/16 12:19 PM  Result Value Ref Range Status   Specimen Description FLUID PERITONEAL RIGHT  Final   Special Requests NONE  Final   Gram Stain   Final    FEW WBC  PRESENT,BOTH PMN AND MONONUCLEAR NO ORGANISMS SEEN    Report Status 10/30/2016 FINAL  Final         Radiology Studies: Dg Chest 2 View  Result Date: 10/29/2016 CLINICAL DATA:  Shortness of breath and cough. History of cirrhosis and status post TIPS in September, 2016 to treat recurrent right hepatic hydrothorax. EXAM: CHEST  2 VIEW COMPARISON:  08/09/2016 FINDINGS: The heart size and mediastinal contours are within normal limits. Large right pleural effusion present. There is compressive atelectasis of the right lower lung. No edema or left pleural fluid. No airspace consolidation or pneumothorax. The visualized skeletal structures are unremarkable. IMPRESSION: Large right pleural effusion. Given prior clinical history, this likely represents recurrent hepatic hydrothorax. Electronically Signed   By: Aletta Edouard M.D.   On: 10/29/2016 13:21   Dg Chest 1 View  Result Date: 10/30/2016 CLINICAL DATA:  Patient status post right-sided thoracentesis. Chest pain and productive cough. EXAM: CHEST 1 VIEW COMPARISON:  Chest radiograph 10/29/2016. FINDINGS: Monitoring leads overlie the patient. Stable enlarged cardiac and mediastinal contours. Significant interval decrease in size of now small right pleural effusion. Heterogeneous opacities within the right mid and lower lung. No definite pneumothorax. IMPRESSION: Interval decrease in size of now small right pleural effusion status post thoracentesis. Heterogeneous opacities within the right mid and lower lung may represent atelectasis, reexpansion edema and/or infection. Electronically Signed   By: Lovey Newcomer M.D.   On: 10/30/2016 12:53   US Thoracentesis Asp Pleural Space W/img Guide  Result Date: 10/30/2016 INDICATION: Patient with prior history of TIPS 2016, dyspnea, recurrent right hepatic hydrothorax; request made for diagnostic and therapeutic right thoracentesis. EXAM: ULTRASOUND GUIDED DIAGNOSTIC AND THERAPEUTIC RIGHT THORACENTESIS MEDICATIONS:  None. COMPLICATIONS: None immediate. PROCEDURE: An ultrasound guided thoracentesis was thoroughly discussed with the patient and questions answered. The benefits, risks, alternatives and complications were also discussed. The patient understands and wishes to proceed with the procedure. Written consent was obtained. Ultrasound was performed to localize and mark an adequate pocket of fluid in the right chest. The area was then prepped and draped in the normal sterile fashion. 1% Lidocaine was used for local anesthesia. Under ultrasound guidance a Safe-T-Centesis catheter was introduced. Thoracentesis was performed. The catheter was removed and a dressing applied. FINDINGS: A total of approximately 2.2 liters of hazy, light yellow fluid was removed. Samples were sent to the laboratory as requested by the clinical team. Due to persistent patient coughing only the above amount of fluid was removed at this time. IMPRESSION: Successful ultrasound guided diagnostic and therapeutic right thoracentesis yielding 2.2 liters of pleural fluid. Patient has also been scheduled for follow-up TIPS ultrasound to assess patency. Read by: Rowe Robert, PA-C Electronically Signed   By: Sandi Mariscal M.D.   On: 10/30/2016 12:47        Scheduled Meds: . folic acid  1 mg Oral Daily  . [  START ON 10/31/2016] furosemide  40 mg Intravenous Q6H  . lactulose  15 g Oral QID  . lidocaine (PF)      . rifaximin  550 mg Oral BID  . [START ON 10/31/2016] spironolactone  25 mg Oral Daily  . thiamine  100 mg Oral Daily  . [START ON 10/31/2016] torsemide  30 mg Oral Daily   Continuous Infusions:   LOS: 1 day    HONGALGI,ANAND, MD [facility] Pager [phone]  If 7PM-7AM, please contact night-coverage [email or web address] Password [email or web address] 10/30/2016, 4:56 PM

## 2016-10-30 NOTE — Progress Notes (Signed)
Patient arrived on the unit via bed to room 34.  Patient is alert and oriented x4.  Skin assessment performed with Ivana.  No skin issues.  IV to the Left hand. Saline locked. Educated the patient on how to use the remote control, phone, and how to contact the nurse/staff. Vitals stable.  Lowered the bed and placed call light within reach .  Will continue to monitor

## 2016-10-31 ENCOUNTER — Inpatient Hospital Stay (HOSPITAL_COMMUNITY): Payer: Commercial Managed Care - HMO

## 2016-10-31 DIAGNOSIS — R1031 Right lower quadrant pain: Secondary | ICD-10-CM | POA: Diagnosis not present

## 2016-10-31 DIAGNOSIS — I517 Cardiomegaly: Secondary | ICD-10-CM | POA: Diagnosis not present

## 2016-10-31 DIAGNOSIS — J948 Other specified pleural conditions: Secondary | ICD-10-CM | POA: Diagnosis not present

## 2016-10-31 DIAGNOSIS — Z4682 Encounter for fitting and adjustment of non-vascular catheter: Secondary | ICD-10-CM | POA: Diagnosis not present

## 2016-10-31 DIAGNOSIS — Z992 Dependence on renal dialysis: Secondary | ICD-10-CM | POA: Diagnosis not present

## 2016-10-31 DIAGNOSIS — R0602 Shortness of breath: Secondary | ICD-10-CM | POA: Diagnosis not present

## 2016-10-31 DIAGNOSIS — R918 Other nonspecific abnormal finding of lung field: Secondary | ICD-10-CM | POA: Diagnosis not present

## 2016-10-31 DIAGNOSIS — J9601 Acute respiratory failure with hypoxia: Secondary | ICD-10-CM

## 2016-10-31 DIAGNOSIS — I130 Hypertensive heart and chronic kidney disease with heart failure and stage 1 through stage 4 chronic kidney disease, or unspecified chronic kidney disease: Secondary | ICD-10-CM | POA: Diagnosis not present

## 2016-10-31 DIAGNOSIS — D62 Acute posthemorrhagic anemia: Secondary | ICD-10-CM | POA: Diagnosis not present

## 2016-10-31 DIAGNOSIS — N189 Chronic kidney disease, unspecified: Secondary | ICD-10-CM | POA: Diagnosis not present

## 2016-10-31 DIAGNOSIS — I120 Hypertensive chronic kidney disease with stage 5 chronic kidney disease or end stage renal disease: Secondary | ICD-10-CM | POA: Diagnosis not present

## 2016-10-31 DIAGNOSIS — K922 Gastrointestinal hemorrhage, unspecified: Secondary | ICD-10-CM | POA: Diagnosis not present

## 2016-10-31 DIAGNOSIS — K7689 Other specified diseases of liver: Secondary | ICD-10-CM | POA: Diagnosis not present

## 2016-10-31 DIAGNOSIS — N184 Chronic kidney disease, stage 4 (severe): Secondary | ICD-10-CM | POA: Diagnosis not present

## 2016-10-31 DIAGNOSIS — K7290 Hepatic failure, unspecified without coma: Secondary | ICD-10-CM | POA: Diagnosis not present

## 2016-10-31 DIAGNOSIS — I872 Venous insufficiency (chronic) (peripheral): Secondary | ICD-10-CM | POA: Diagnosis not present

## 2016-10-31 DIAGNOSIS — K7040 Alcoholic hepatic failure without coma: Secondary | ICD-10-CM | POA: Diagnosis not present

## 2016-10-31 DIAGNOSIS — J90 Pleural effusion, not elsewhere classified: Secondary | ICD-10-CM | POA: Diagnosis not present

## 2016-10-31 DIAGNOSIS — K7041 Alcoholic hepatic failure with coma: Secondary | ICD-10-CM | POA: Diagnosis not present

## 2016-10-31 DIAGNOSIS — N179 Acute kidney failure, unspecified: Secondary | ICD-10-CM | POA: Diagnosis not present

## 2016-10-31 DIAGNOSIS — I129 Hypertensive chronic kidney disease with stage 1 through stage 4 chronic kidney disease, or unspecified chronic kidney disease: Secondary | ICD-10-CM | POA: Diagnosis not present

## 2016-10-31 DIAGNOSIS — G92 Toxic encephalopathy: Secondary | ICD-10-CM | POA: Diagnosis not present

## 2016-10-31 DIAGNOSIS — I4589 Other specified conduction disorders: Secondary | ICD-10-CM | POA: Diagnosis not present

## 2016-10-31 DIAGNOSIS — R9431 Abnormal electrocardiogram [ECG] [EKG]: Secondary | ICD-10-CM | POA: Diagnosis not present

## 2016-10-31 DIAGNOSIS — R188 Other ascites: Secondary | ICD-10-CM | POA: Diagnosis not present

## 2016-10-31 DIAGNOSIS — R578 Other shock: Secondary | ICD-10-CM | POA: Diagnosis not present

## 2016-10-31 DIAGNOSIS — N186 End stage renal disease: Secondary | ICD-10-CM | POA: Diagnosis not present

## 2016-10-31 DIAGNOSIS — M7981 Nontraumatic hematoma of soft tissue: Secondary | ICD-10-CM | POA: Diagnosis not present

## 2016-10-31 DIAGNOSIS — J811 Chronic pulmonary edema: Secondary | ICD-10-CM | POA: Diagnosis not present

## 2016-10-31 DIAGNOSIS — I361 Nonrheumatic tricuspid (valve) insufficiency: Secondary | ICD-10-CM | POA: Diagnosis not present

## 2016-10-31 DIAGNOSIS — R0902 Hypoxemia: Secondary | ICD-10-CM | POA: Diagnosis not present

## 2016-10-31 DIAGNOSIS — J810 Acute pulmonary edema: Secondary | ICD-10-CM | POA: Diagnosis not present

## 2016-10-31 DIAGNOSIS — I313 Pericardial effusion (noninflammatory): Secondary | ICD-10-CM | POA: Diagnosis not present

## 2016-10-31 DIAGNOSIS — K5730 Diverticulosis of large intestine without perforation or abscess without bleeding: Secondary | ICD-10-CM | POA: Diagnosis not present

## 2016-10-31 DIAGNOSIS — K7031 Alcoholic cirrhosis of liver with ascites: Secondary | ICD-10-CM | POA: Diagnosis not present

## 2016-10-31 DIAGNOSIS — I509 Heart failure, unspecified: Secondary | ICD-10-CM | POA: Diagnosis not present

## 2016-10-31 DIAGNOSIS — J9691 Respiratory failure, unspecified with hypoxia: Secondary | ICD-10-CM | POA: Diagnosis not present

## 2016-10-31 DIAGNOSIS — K7460 Unspecified cirrhosis of liver: Secondary | ICD-10-CM | POA: Diagnosis not present

## 2016-10-31 DIAGNOSIS — R000 Tachycardia, unspecified: Secondary | ICD-10-CM | POA: Diagnosis not present

## 2016-10-31 DIAGNOSIS — J189 Pneumonia, unspecified organism: Secondary | ICD-10-CM | POA: Diagnosis not present

## 2016-10-31 DIAGNOSIS — D649 Anemia, unspecified: Secondary | ICD-10-CM | POA: Diagnosis not present

## 2016-10-31 DIAGNOSIS — R4182 Altered mental status, unspecified: Secondary | ICD-10-CM | POA: Diagnosis not present

## 2016-10-31 DIAGNOSIS — I5032 Chronic diastolic (congestive) heart failure: Secondary | ICD-10-CM | POA: Diagnosis not present

## 2016-10-31 DIAGNOSIS — I132 Hypertensive heart and chronic kidney disease with heart failure and with stage 5 chronic kidney disease, or end stage renal disease: Secondary | ICD-10-CM | POA: Diagnosis not present

## 2016-10-31 DIAGNOSIS — J918 Pleural effusion in other conditions classified elsewhere: Secondary | ICD-10-CM | POA: Diagnosis not present

## 2016-10-31 DIAGNOSIS — E8779 Other fluid overload: Secondary | ICD-10-CM | POA: Diagnosis not present

## 2016-10-31 DIAGNOSIS — K7030 Alcoholic cirrhosis of liver without ascites: Secondary | ICD-10-CM | POA: Diagnosis not present

## 2016-10-31 DIAGNOSIS — N183 Chronic kidney disease, stage 3 (moderate): Secondary | ICD-10-CM | POA: Diagnosis not present

## 2016-10-31 DIAGNOSIS — Z4901 Encounter for fitting and adjustment of extracorporeal dialysis catheter: Secondary | ICD-10-CM | POA: Diagnosis not present

## 2016-10-31 DIAGNOSIS — E8770 Fluid overload, unspecified: Secondary | ICD-10-CM | POA: Diagnosis not present

## 2016-10-31 DIAGNOSIS — I519 Heart disease, unspecified: Secondary | ICD-10-CM | POA: Diagnosis not present

## 2016-10-31 DIAGNOSIS — N022 Recurrent and persistent hematuria with diffuse membranous glomerulonephritis: Secondary | ICD-10-CM | POA: Diagnosis not present

## 2016-10-31 DIAGNOSIS — I959 Hypotension, unspecified: Secondary | ICD-10-CM | POA: Diagnosis not present

## 2016-10-31 DIAGNOSIS — G9341 Metabolic encephalopathy: Secondary | ICD-10-CM | POA: Diagnosis not present

## 2016-10-31 DIAGNOSIS — J9811 Atelectasis: Secondary | ICD-10-CM | POA: Diagnosis not present

## 2016-10-31 DIAGNOSIS — E872 Acidosis: Secondary | ICD-10-CM | POA: Diagnosis not present

## 2016-10-31 LAB — CBC
HEMATOCRIT: 27.6 % — AB (ref 36.0–46.0)
HEMOGLOBIN: 9.3 g/dL — AB (ref 12.0–15.0)
MCH: 26.7 pg (ref 26.0–34.0)
MCHC: 33.7 g/dL (ref 30.0–36.0)
MCV: 79.3 fL (ref 78.0–100.0)
Platelets: 222 10*3/uL (ref 150–400)
RBC: 3.48 MIL/uL — ABNORMAL LOW (ref 3.87–5.11)
RDW: 17 % — ABNORMAL HIGH (ref 11.5–15.5)
WBC: 15.8 10*3/uL — ABNORMAL HIGH (ref 4.0–10.5)

## 2016-10-31 LAB — COMPREHENSIVE METABOLIC PANEL
ALK PHOS: 144 U/L — AB (ref 38–126)
ALT: 16 U/L (ref 14–54)
AST: 56 U/L — AB (ref 15–41)
Albumin: <1 g/dL — ABNORMAL LOW (ref 3.5–5.0)
Anion gap: 6 (ref 5–15)
BILIRUBIN TOTAL: 0.8 mg/dL (ref 0.3–1.2)
BUN: 50 mg/dL — AB (ref 6–20)
CALCIUM: 7.4 mg/dL — AB (ref 8.9–10.3)
CHLORIDE: 117 mmol/L — AB (ref 101–111)
CO2: 16 mmol/L — ABNORMAL LOW (ref 22–32)
CREATININE: 2.32 mg/dL — AB (ref 0.44–1.00)
GFR, EST AFRICAN AMERICAN: 25 mL/min — AB (ref >60)
GFR, EST NON AFRICAN AMERICAN: 22 mL/min — AB (ref >60)
Glucose, Bld: 93 mg/dL (ref 65–99)
Potassium: 4.3 mmol/L (ref 3.5–5.1)
Sodium: 139 mmol/L (ref 135–145)
Total Protein: 3.8 g/dL — ABNORMAL LOW (ref 6.5–8.1)

## 2016-10-31 LAB — PATHOLOGIST SMEAR REVIEW

## 2016-10-31 MED ORDER — FUROSEMIDE 10 MG/ML IJ SOLN: 40.0000 mg | Freq: Four times a day (QID) | INTRAMUSCULAR | 0 refills | Status: DC

## 2016-10-31 MED ORDER — FUROSEMIDE 10 MG/ML IJ SOLN
80.0000 mg | Freq: Four times a day (QID) | INTRAMUSCULAR | Status: DC
  Administered 2016-10-31: 80 mg via INTRAVENOUS
  Filled 2016-10-31: qty 8

## 2016-10-31 NOTE — Progress Notes (Signed)
Patient has been accepted by Dr. Ina Homes at [facility]. A bed is currently not available, awaiting a call from [facility] with a bed. I did get her imaging copied onto a disc and it is in her chart.

## 2016-10-31 NOTE — Discharge Summary (Addendum)
Physician Discharge Summary  Savannah Jackson OBS:[id number] DOB: 1956/03/27 DOA: 10/29/2016  PCP: Rosita Fire, MD  Admit date: 10/29/2016 Discharge date: 10/31/2016  Admitted From: Home Disposition:  [facility] for tertiary care  Recommendations for Outpatient Follow-up:  1. Follow up at [facility]-accepted under care Dr. Tempie Hoist for further management of ESLD  2. D/c on lasix iv 40 q6-torsemide/aldactone held so far this admit 2/2 to a[facility]  3. Follow thoracentesis culture performed 10/30/16-was not on IV abx this admit 4. 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 Admitted to [facility] 10/27-11/2 for similar issue. She has had prior therapeutic right thoracentesis. She was transferred to [facility] for therapeutic right thoracentesis by IR. IR, nephrology and GI consulted.  Discharge Diagnoses:  Principal Problem:   Acute respiratory failure with hypoxia (HCC) Active Problems:   Cirrhosis, alcoholic (HCC)   Bilateral leg edema   Pleural effusion associated with hepatic disorder   Chronic kidney disease (CKD), stage IV (severe) (HCC)   Alcoholic cirrhosis of liver with ascites (HCC)   Anasarca   Hypoalbuminemia  Leukocytosis   Diastolic dysfunction with chronic heart failure (HCC)   Membranous glomerulonephritis   Chronic kidney disease (CKD) stage G3b/A3, moderately decreased glomerular filtration rate (GFR) between 30-44 mL/min/1.73 square meter and albuminuria creatinine ratio greater than 300 mg/g   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 Recurrent right pleural effusion/hepatic hydrothorax/acute respiratory failure: This is in the context of generalized volume overload from cirrhosis and nephrotic syndrome. Status post 2.2 L of therapeutic and diagnostic thoracentesis by IR. Follow outstanding results. Diuretics. GI/IR evaluated patency of TIPS--this can be followed at [facility] as an OP and formal report is pending 2. Anasarca: Secondary to cirrhosis, nephrotic syndrome from membranous glomerulonephritis and diastolic CHF. As discussed with nephrology, continue IV Lasix but hold Aldactone and Demadex due to concern for worsening of renal functions. 3. Acute on chronic kidney disease stage IV, secondary to membranous GN/non-anion gap metabolic acidosis: Nephrology consultation appreciated. Serum creatinine ranging between 1.7-2.3 over the last 2 months. Biopsy confirmed at [facility] in November 2017. Apparently did not tolerate prednisone. Plans are for rituximab but given her current condition and leukocytosis, holding off on any immunosuppressants at this time. Diuretic management as above.  4. Alcoholic cirrhosis complicated by hepatic encephalopathy, nonbleeding esophageal varices, ascites, hepatic hydrothorax - recurrent s/p TIPS: [place] [facility] consultation appreciated. She is followed by [facility] transplant team and as of 10/02/16, evaluation was still on hold due to inability to obtain cross-sectional imaging, renal dysfunction and psychosocial concerns. Clinically not encephalopathic. Continue  Xifaxan and lactulose. 5. Anemia of chronic disease: Follow CBCs and transfuse if hemoglobin <7 g per DL. 6. Leukocytosis: No symptoms suggestive of infection. No cough, abdominal pain or  fevers. However monitor closely and consider antibiotics if any decline.  pro-calcitonin elevated but afebrile-low suspicion once again for overt infection 7. Essential hypertension: Controlled. 8. Severe hypoalbuminemia: Secondary to cirrhosis and nephrotic syndrome   Discharge Instructions   Allergies as of 10/31/2016      Reactions   Latex Swelling   Discoloration of skin.    Vicodin [hydrocodone-acetaminophen] Itching      Medication List    STOP taking these medications   spironolactone 25 MG tablet Commonly known as:  ALDACTONE   torsemide 20 MG tablet Commonly known as:  DEMADEX     TAKE these medications   acetaminophen 325 MG tablet Commonly known as:  TYLENOL Take 325 mg by mouth 2 (two) times daily as needed for moderate pain.   folic acid 1 MG tablet Commonly known as:  FOLVITE Take 1 tablet (1 mg total) by mouth daily.   furosemide 10 MG/ML injection Commonly known as:  LASIX Inject 4 mLs (40 mg total) into the vein every 6 (six) hours.   lactulose 10 GM/15ML solution Commonly known as:  CHRONULAC Take 22.5 mLs (15 g total) by mouth 4 (four) times daily. Four times a day   rifaximin 550 MG Tabs tablet Commonly known as:  XIFAXAN Take 550 mg by mouth 2 (two) times daily.   thiamine 100 MG tablet Take 1 tablet (100 mg total) by mouth daily.       Allergies  Allergen Reactions  . Latex Swelling    Discoloration of skin.   . Vicodin [Hydrocodone-Acetaminophen] Itching    Consultations:  Nephrology  GI   Procedures/Studies: Dg Chest 2 View  Result Date: 10/29/2016 CLINICAL DATA:  Shortness of breath and cough. History of cirrhosis and status post TIPS in September, 2016 to treat recurrent right hepatic hydrothorax. EXAM: CHEST  2 VIEW COMPARISON:   08/09/2016 FINDINGS: The heart size and mediastinal contours are within normal limits. Large right pleural effusion present. There is compressive atelectasis of the right lower lung. No edema or left pleural fluid. No airspace consolidation or pneumothorax. The visualized skeletal structures are unremarkable. IMPRESSION: Large right pleural effusion. Given prior clinical history, this likely represents recurrent hepatic hydrothorax. Electronically Signed   By: Aletta Edouard M.D.   On: 10/29/2016 13:21   Dg Chest 1 View  Result Date: 10/30/2016 CLINICAL DATA:  Patient status post right-sided thoracentesis. Chest pain and productive cough. EXAM: CHEST 1 VIEW COMPARISON:  Chest radiograph 10/29/2016. FINDINGS: Monitoring leads overlie the patient. Stable enlarged cardiac and mediastinal contours. Significant interval decrease in size of now small right pleural effusion. Heterogeneous opacities within the right mid and lower lung. No definite pneumothorax. IMPRESSION: Interval decrease in size of now small right pleural effusion status post thoracentesis. Heterogeneous opacities within the right mid and lower lung may represent atelectasis, reexpansion edema and/or infection. Electronically Signed   By: Lovey Newcomer M.D.   On: 10/30/2016 12:53   US Thoracentesis Asp Pleural Space W/img Guide  Result Date: 10/30/2016 INDICATION: Patient with prior history of TIPS 2016, dyspnea, recurrent right hepatic hydrothorax; request made for diagnostic and therapeutic right thoracentesis. EXAM: ULTRASOUND GUIDED DIAGNOSTIC AND THERAPEUTIC RIGHT THORACENTESIS MEDICATIONS: None. COMPLICATIONS: None immediate. PROCEDURE: An ultrasound guided thoracentesis was thoroughly discussed with the patient and questions answered. The benefits, risks, alternatives and complications were also discussed. The patient understands and wishes to proceed with the procedure. Written consent was obtained. Ultrasound was performed to localize and  mark an adequate pocket of fluid in the  right chest. The area was then prepped and draped in the normal sterile fashion. 1% Lidocaine was used for local anesthesia. Under ultrasound guidance a Safe-T-Centesis catheter was introduced. Thoracentesis was performed. The catheter was removed and a dressing applied. FINDINGS: A total of approximately 2.2 liters of hazy, light yellow fluid was removed. Samples were sent to the laboratory as requested by the clinical team. Due to persistent patient coughing only the above amount of fluid was removed at this time. IMPRESSION: Successful ultrasound guided diagnostic and therapeutic right thoracentesis yielding 2.2 liters of pleural fluid. Patient has also been scheduled for follow-up TIPS ultrasound to assess patency. Read by: Rowe Robert, PA-C Electronically Signed   By: Sandi Mariscal M.D.   On: 10/30/2016 12:47    (Echo, Carotid, EGD, Colonoscopy, ERCP)    Subjective:   Discharge Exam: Vitals:   10/30/16 1521 10/31/16 0528  BP: 100/61 101/61  Pulse: 70 70  Resp: 17 18  Temp: 97.6 F (36.4 C) 98 F (36.7 C)   Vitals:   10/30/16 1521 10/30/16 2001 10/31/16 0525 10/31/16 0528  BP: 100/61   101/61  Pulse: 70   70  Resp: 17   18  Temp: 97.6 F (36.4 C)   98 F (36.7 C)  TempSrc: Oral   Oral  SpO2: 100% 97%  100%  Weight:   100.9 kg (222 lb 8 oz)   Height:        General: Pt is alert, awake, not in acute distress Cardiovascular: RRR, S1/S2 +, no rubs, no gallops Respiratory: CTA bilaterally, no wheezing, no rhonchi Abdominal: Soft, NT, ND, bowel sounds + Extremities: no edema, no cyanosis    The results of significant diagnostics from this hospitalization (including imaging, microbiology, ancillary and laboratory) are listed below for reference.     Microbiology: Recent Results (from the past 240 hour(s))  Gram stain     Status: None   Collection Time: 10/30/16 12:19 PM  Result Value Ref Range Status   Specimen Description FLUID  PERITONEAL RIGHT  Final   Special Requests NONE  Final   Gram Stain   Final    FEW WBC PRESENT,BOTH PMN AND MONONUCLEAR NO ORGANISMS SEEN    Report Status 10/30/2016 FINAL  Final     Labs: BNP (last 3 results)  Recent Labs  04/18/16 2113  BNP 024.0*   Basic Metabolic Panel:  Recent Labs Lab 10/29/16 1413 10/30/16 0447  NA 138 139  K 3.9 4.2  CL 113* 114*  CO2 19* 16*  GLUCOSE 97 84  BUN 45* 43*  CREATININE 2.28* 2.46*  CALCIUM 7.8* 7.7*   Liver Function Tests:  Recent Labs Lab 10/29/16 1413 10/30/16 0447  AST 57* 52*  ALT 19 16  ALKPHOS 157* 129*  BILITOT 1.2 1.1  PROT 4.7* 3.9*  ALBUMIN 1.2* 1.1*   No results for input(s): LIPASE, AMYLASE in the last 168 hours. No results for input(s): AMMONIA in the last 168 hours. CBC:  Recent Labs Lab 10/29/16 1217 10/30/16 0447  WBC 18.8* 18.3*  HGB 12.0 10.0*  HCT 36.5 29.6*  MCV 83.5 78.3  PLT 175 209   Cardiac Enzymes: No results for input(s): CKTOTAL, CKMB, CKMBINDEX, TROPONINI in the last 168 hours. BNP: Invalid input(s): POCBNP CBG: No results for input(s): GLUCAP in the last 168 hours. D-Dimer No results for input(s): DDIMER in the last 72 hours. Hgb A1c No results for input(s): HGBA1C in the last 72 hours. Lipid Profile No results for input(s): CHOL, HDL,  LDLCALC, TRIG, CHOLHDL, LDLDIRECT in the last 72 hours. Thyroid function studies No results for input(s): TSH, T4TOTAL, T3FREE, THYROIDAB in the last 72 hours.  Invalid input(s): FREET3 Anemia work up No results for input(s): VITAMINB12, FOLATE, FERRITIN, TIBC, IRON, RETICCTPCT in the last 72 hours. Urinalysis    Component Value Date/Time   COLORURINE YELLOW 10/19/2015 1656   APPEARANCEUR CLEAR 10/19/2015 1656   LABSPEC 1.010 10/19/2015 1656   PHURINE 5.5 10/19/2015 1656   GLUCOSEU NEGATIVE 10/19/2015 1656   HGBUR SMALL (A) 10/19/2015 1656   BILIRUBINUR NEGATIVE 10/19/2015 1656   KETONESUR NEGATIVE 10/19/2015 1656   PROTEINUR TRACE  (A) 10/19/2015 1656   UROBILINOGEN 0.2 08/30/2015 1642   NITRITE NEGATIVE 10/19/2015 1656   LEUKOCYTESUR NEGATIVE 10/19/2015 1656   Sepsis Labs Invalid input(s): PROCALCITONIN,  WBC,  LACTICIDVEN Microbiology Recent Results (from the past 240 hour(s))  Gram stain     Status: None   Collection Time: 10/30/16 12:19 PM  Result Value Ref Range Status   Specimen Description FLUID PERITONEAL RIGHT  Final   Special Requests NONE  Final   Gram Stain   Final    FEW WBC PRESENT,BOTH PMN AND MONONUCLEAR NO ORGANISMS SEEN    Report Status 10/30/2016 FINAL  Final     Time coordinating discharge: Over 30 minutes  SIGNED:   Nita Sells, MD  [facility] 10/31/2016, 10:44 AM Pager   If 7PM-7AM, please contact night-coverage [email or web address] Password [email or web address]

## 2016-10-31 NOTE — Care Management Note (Signed)
Case Management Note  Patient Details  Name: Savannah Jackson MRN: [id number] Date of Birth: 1955-12-12  Subjective/Objective:                    Action/Plan:  Acute transfer, facilitated by MD and nursing staff.   Expected Discharge Date:                  Expected Discharge Plan:  Acute to Acute Transfer  In-House Referral:     Discharge planning Services  CM Consult  Post Acute Care Choice:    Choice offered to:     DME Arranged:    DME Agency:     HH Arranged:    HH Agency:     Status of Service:  Completed, signed off  If discussed at [employer] of Stay Meetings, dates discussed:    Additional Comments:  Carles Collet, RN 10/31/2016, 10:55 AM

## 2016-10-31 NOTE — Progress Notes (Signed)
Pt refused foley catheter to be placed. She said she is urinating.

## 2016-10-31 NOTE — Progress Notes (Signed)
Report called to Temo, Rn at [facility]. Pt has bed available now. She is going to the [facility] room 3307. [facility] was called earlier and will arrive around 7:30 to transport her. All records have been printed for [facility] to take with them.

## 2016-10-31 NOTE — Progress Notes (Signed)
Patient ID: Savannah Jackson, female   DOB: [birth year], 61 y.o.   MRN: [id number] S:No new complaints O:BP 101/61 (BP Location: Left Arm)   Pulse 70   Temp 98 F (36.7 C) (Oral)   Resp 18   Ht 5\' 3"  (1.6 m)   Wt 100.9 kg (222 lb 8 oz)   SpO2 100%   BMI 39.41 kg/m   Intake/Output Summary (Last 24 hours) at 10/31/16 1253 Last data filed at 10/31/16 1101  Gross per 24 hour  Intake                0 ml  Output              275 ml  Net             -275 ml   Intake/Output: No intake/output data recorded.  Intake/Output this shift:  Total I/O In: -  Out: 275 [Urine:275] Weight change: 3.856 kg (8 lb 8 oz) Gen: AAF in NAD CVS:no rub Resp:scattered expiratory wheezes, decreased BS at bases R>L GNO:IBBCW, +BS Ext:+anasarca   Recent Labs Lab 10/29/16 1413 10/30/16 0447  NA 138 139  K 3.9 4.2  CL 113* 114*  CO2 19* 16*  GLUCOSE 97 84  BUN 45* 43*  CREATININE 2.28* 2.46*  ALBUMIN 1.2* 1.1*  CALCIUM 7.8* 7.7*  AST 57* 52*  ALT 19 16   Liver Function Tests:  Recent Labs Lab 10/29/16 1413 10/30/16 0447  AST 57* 52*  ALT 19 16  ALKPHOS 157* 129*  BILITOT 1.2 1.1  PROT 4.7* 3.9*  ALBUMIN 1.2* 1.1*   No results for input(s): LIPASE, AMYLASE in the last 168 hours. No results for input(s): AMMONIA in the last 168 hours. CBC:  Recent Labs Lab 10/29/16 1217 10/30/16 0447 10/31/16 1138  WBC 18.8* 18.3* 15.8*  HGB 12.0 10.0* 9.3*  HCT 36.5 29.6* 27.6*  MCV 83.5 78.3 79.3  PLT 175 209 222   Cardiac Enzymes: No results for input(s): CKTOTAL, CKMB, CKMBINDEX, TROPONINI in the last 168 hours. CBG: No results for input(s): GLUCAP in the last 168 hours.  Iron Studies: No results for input(s): IRON, TIBC, TRANSFERRIN, FERRITIN in the last 72 hours. Studies/Results: Dg Chest 2 View  Result Date: 10/29/2016 CLINICAL DATA:  Shortness of breath and cough. History of cirrhosis and status post TIPS in September, 2016 to treat recurrent right hepatic hydrothorax. EXAM:  CHEST  2 VIEW COMPARISON:  08/09/2016 FINDINGS: The heart size and mediastinal contours are within normal limits. Large right pleural effusion present. There is compressive atelectasis of the right lower lung. No edema or left pleural fluid. No airspace consolidation or pneumothorax. The visualized skeletal structures are unremarkable. IMPRESSION: Large right pleural effusion. Given prior clinical history, this likely represents recurrent hepatic hydrothorax. Electronically Signed   By: Aletta Edouard M.D.   On: 10/29/2016 13:21   [place] Art/ven Flow Abd Pelv Doppler  Result Date: 10/31/2016 CLINICAL DATA:  61 year old female status post TIPS. TIPS performed 07/16/2015 with 10 mm x 9 cm Viatorr stent graft, with post dilation to 8 mm. EXAM: DUPLEX ULTRASOUND OF LIVER TECHNIQUE: Color and duplex Doppler ultrasound was performed to evaluate the hepatic in-flow and out-flow vessels. COMPARISON:  10/30/2016, 07/04/2016 FINDINGS: Portal Vein Velocities Main:  42.7 cm/sec Left:  25.6 cm/sec Velocity within patent stent: Proximal:  183.1 centimeter/second Mid:  182.3 centimeter/second Distal:  124.6 centimeter/second Hepatic Vein Velocities Right:  86.2 cm/sec Middle:  30.4 cm/sec Left:  20.7 cm/sec Hepatic Artery Velocity:  102 cm/sec Splenic Vein Velocity:   cm/sec Varices: Absent Ascites: Abscess Right-sided pleural effusion IMPRESSION: Patent TIPS shunt with measured velocities increased from the comparison study. Right-sided pleural effusion. Signed, Dulcy Fanny. Earleen Newport, DO [facility] Radiology Electronically Signed   By: Corrie Mckusick D.O.   On: 10/31/2016 11:37   Dg Chest 1 View  Result Date: 10/30/2016 CLINICAL DATA:  Patient status post right-sided thoracentesis. Chest pain and productive cough. EXAM: CHEST 1 VIEW COMPARISON:  Chest radiograph 10/29/2016. FINDINGS: Monitoring leads overlie the patient. Stable enlarged cardiac and mediastinal contours. Significant  interval decrease in size of now small right pleural effusion. Heterogeneous opacities within the right mid and lower lung. No definite pneumothorax. IMPRESSION: Interval decrease in size of now small right pleural effusion status post thoracentesis. Heterogeneous opacities within the right mid and lower lung may represent atelectasis, reexpansion edema and/or infection. Electronically Signed   By: Lovey Newcomer M.D.   On: 10/30/2016 12:53   US Thoracentesis Asp Pleural Space W/img Guide  Result Date: 10/30/2016 INDICATION: Patient with prior history of TIPS 2016, dyspnea, recurrent right hepatic hydrothorax; request made for diagnostic and therapeutic right thoracentesis. EXAM: ULTRASOUND GUIDED DIAGNOSTIC AND THERAPEUTIC RIGHT THORACENTESIS MEDICATIONS: None. COMPLICATIONS: None immediate. PROCEDURE: An ultrasound guided thoracentesis was thoroughly discussed with the patient and questions answered. The benefits, risks, alternatives and complications were also discussed. The patient understands and wishes to proceed with the procedure. Written consent was obtained. Ultrasound was performed to localize and mark an adequate pocket of fluid in the right chest. The area was then prepped and draped in the normal sterile fashion. 1% Lidocaine was used for local anesthesia. Under ultrasound guidance a Safe-T-Centesis catheter was introduced. Thoracentesis was performed. The catheter was removed and a dressing applied. FINDINGS: A total of approximately 2.2 liters of hazy, light yellow fluid was removed. Samples were sent to the laboratory as requested by the clinical team. Due to persistent patient coughing only the above amount of fluid was removed at this time. IMPRESSION: Successful ultrasound guided diagnostic and therapeutic right thoracentesis yielding 2.2 liters of pleural fluid. Patient has also been scheduled for follow-up TIPS ultrasound to assess patency. Read by: Rowe Robert, PA-C Electronically Signed    By: Sandi Mariscal M.D.   On: 94/17/4081 44:81   . folic acid  1 mg Oral Daily  . furosemide  40 mg Intravenous Q6H  . lactulose  15 g Oral QID  . rifaximin  550 mg Oral BID  . thiamine  100 mg Oral Daily    BMET    Component Value Date/Time   NA 139 10/30/2016 0447   K 4.2 10/30/2016 0447   CL 114 (H) 10/30/2016 0447   CO2 16 (L) 10/30/2016 0447   GLUCOSE 84 10/30/2016 0447   BUN 43 (H) 10/30/2016 0447   CREATININE 2.46 (H) 10/30/2016 0447   CREATININE 1.80 (H) 08/02/2016 1105   CALCIUM 7.7 (L) 10/30/2016 0447   GFRNONAA 20 (L) 10/30/2016 0447   GFRNONAA 58 (L) 08/03/2015 1054   GFRAA 23 (L) 10/30/2016 0447   GFRAA 67 08/03/2015 1054   CBC    Component Value Date/Time   WBC 15.8 (H) 10/31/2016 1138   RBC 3.48 (L) 10/31/2016 1138   HGB 9.3 (L) 10/31/2016 1138   HCT 27.6 (L) 10/31/2016 1138   PLT 222 10/31/2016 1138   MCV 79.3 10/31/2016 1138   MCH 26.7 10/31/2016 1138   MCHC 33.7 10/31/2016 1138   RDW 17.0 (H)  10/31/2016 1138   LYMPHSABS 1.7 08/09/2016 1501   MONOABS 1.5 (H) 08/09/2016 1501   EOSABS 0.2 08/09/2016 1501   BASOSABS 0.1 08/09/2016 1501     Assessment/Plan: 1.  AKI/CKD- in setting of nephrotic syndrome and cirrhosis with underlying membranous GN and diuresis.  Agree with holding aldactone but would increase IV furosemide due to massive edema/anasarca. 2. Membranous Nephropathy seen on biopsy at [facility] in November 2017 with plans for rituximab, however given her current condition and leukocytosis would hold off on any immunosuppressive therapy at this time. 3. Cirrhosis with anasarca- further complicated by diastolic chf and nephrotic syndrome from membranous GN.   1. Increase lasix dose to 80mg  IV q 6 and follow 2. Continue with xifaxan and lactulose 4. Recurrent right pleural effusion-  1. S/p IR consult for thoracentesis which yielded 2.2 liters of hazy light yellow fluid 10/30/16 2. US revealed patency of TIPS shunt 5. Disposition- agree with transfer to  [facility] where all of her care has been provided.   6. Anemia of chronic disease- need to follow H/H and make sure she does not have an active GI bleed (hgb has dropped from 12->10) 7. Leukocytosis- need to r/o infection.   8. Disposition- for transfer to [facility] for hepatology and nephrology eval and possible initiation of rituximab  Donetta Potts, MD [facility] [phone]

## 2016-11-02 ENCOUNTER — Encounter: Payer: Self-pay | Admitting: Interventional Radiology

## 2016-11-04 LAB — CULTURE, BODY FLUID W GRAM STAIN -BOTTLE: Culture: NO GROWTH

## 2016-11-04 LAB — CULTURE, BODY FLUID-BOTTLE

## 2016-11-07 ENCOUNTER — Ambulatory Visit (INDEPENDENT_AMBULATORY_CARE_PROVIDER_SITE_OTHER): Payer: Commercial Managed Care - HMO | Admitting: Internal Medicine

## 2016-11-22 DIAGNOSIS — S301XXA Contusion of abdominal wall, initial encounter: Secondary | ICD-10-CM | POA: Insufficient documentation

## 2016-11-27 DIAGNOSIS — M1991 Primary osteoarthritis, unspecified site: Secondary | ICD-10-CM | POA: Diagnosis not present

## 2016-11-27 DIAGNOSIS — D638 Anemia in other chronic diseases classified elsewhere: Secondary | ICD-10-CM | POA: Diagnosis not present

## 2016-11-27 DIAGNOSIS — N186 End stage renal disease: Secondary | ICD-10-CM | POA: Diagnosis not present

## 2016-11-27 DIAGNOSIS — I5030 Unspecified diastolic (congestive) heart failure: Secondary | ICD-10-CM | POA: Diagnosis not present

## 2016-11-27 DIAGNOSIS — K767 Hepatorenal syndrome: Secondary | ICD-10-CM | POA: Diagnosis not present

## 2016-11-27 DIAGNOSIS — K766 Portal hypertension: Secondary | ICD-10-CM | POA: Diagnosis not present

## 2016-11-27 DIAGNOSIS — F1011 Alcohol abuse, in remission: Secondary | ICD-10-CM | POA: Diagnosis not present

## 2016-11-27 DIAGNOSIS — I132 Hypertensive heart and chronic kidney disease with heart failure and with stage 5 chronic kidney disease, or end stage renal disease: Secondary | ICD-10-CM | POA: Diagnosis not present

## 2016-11-27 DIAGNOSIS — K7030 Alcoholic cirrhosis of liver without ascites: Secondary | ICD-10-CM | POA: Diagnosis not present

## 2016-11-28 DIAGNOSIS — Z992 Dependence on renal dialysis: Secondary | ICD-10-CM | POA: Diagnosis not present

## 2016-11-28 DIAGNOSIS — Z1159 Encounter for screening for other viral diseases: Secondary | ICD-10-CM | POA: Diagnosis not present

## 2016-11-28 DIAGNOSIS — N186 End stage renal disease: Secondary | ICD-10-CM | POA: Diagnosis not present

## 2016-11-29 DIAGNOSIS — I10 Essential (primary) hypertension: Secondary | ICD-10-CM | POA: Diagnosis not present

## 2016-11-29 DIAGNOSIS — F1011 Alcohol abuse, in remission: Secondary | ICD-10-CM | POA: Diagnosis not present

## 2016-11-29 DIAGNOSIS — N186 End stage renal disease: Secondary | ICD-10-CM | POA: Diagnosis not present

## 2016-11-29 DIAGNOSIS — M1991 Primary osteoarthritis, unspecified site: Secondary | ICD-10-CM | POA: Diagnosis not present

## 2016-11-29 DIAGNOSIS — I132 Hypertensive heart and chronic kidney disease with heart failure and with stage 5 chronic kidney disease, or end stage renal disease: Secondary | ICD-10-CM | POA: Diagnosis not present

## 2016-11-29 DIAGNOSIS — D638 Anemia in other chronic diseases classified elsewhere: Secondary | ICD-10-CM | POA: Diagnosis not present

## 2016-11-29 DIAGNOSIS — K767 Hepatorenal syndrome: Secondary | ICD-10-CM | POA: Diagnosis not present

## 2016-11-29 DIAGNOSIS — K766 Portal hypertension: Secondary | ICD-10-CM | POA: Diagnosis not present

## 2016-11-29 DIAGNOSIS — I5030 Unspecified diastolic (congestive) heart failure: Secondary | ICD-10-CM | POA: Diagnosis not present

## 2016-11-29 DIAGNOSIS — K7030 Alcoholic cirrhosis of liver without ascites: Secondary | ICD-10-CM | POA: Diagnosis not present

## 2016-11-29 DIAGNOSIS — K7290 Hepatic failure, unspecified without coma: Secondary | ICD-10-CM | POA: Diagnosis not present

## 2016-11-29 DIAGNOSIS — R601 Generalized edema: Secondary | ICD-10-CM | POA: Diagnosis not present

## 2016-11-30 DIAGNOSIS — Z992 Dependence on renal dialysis: Secondary | ICD-10-CM | POA: Diagnosis not present

## 2016-11-30 DIAGNOSIS — N186 End stage renal disease: Secondary | ICD-10-CM | POA: Diagnosis not present

## 2016-11-30 DIAGNOSIS — Z23 Encounter for immunization: Secondary | ICD-10-CM | POA: Diagnosis not present

## 2016-12-01 DIAGNOSIS — K766 Portal hypertension: Secondary | ICD-10-CM | POA: Diagnosis not present

## 2016-12-01 DIAGNOSIS — F1011 Alcohol abuse, in remission: Secondary | ICD-10-CM | POA: Diagnosis not present

## 2016-12-01 DIAGNOSIS — K767 Hepatorenal syndrome: Secondary | ICD-10-CM | POA: Diagnosis not present

## 2016-12-01 DIAGNOSIS — M1991 Primary osteoarthritis, unspecified site: Secondary | ICD-10-CM | POA: Diagnosis not present

## 2016-12-01 DIAGNOSIS — D638 Anemia in other chronic diseases classified elsewhere: Secondary | ICD-10-CM | POA: Diagnosis not present

## 2016-12-01 DIAGNOSIS — K7030 Alcoholic cirrhosis of liver without ascites: Secondary | ICD-10-CM | POA: Diagnosis not present

## 2016-12-01 DIAGNOSIS — I5030 Unspecified diastolic (congestive) heart failure: Secondary | ICD-10-CM | POA: Diagnosis not present

## 2016-12-01 DIAGNOSIS — I132 Hypertensive heart and chronic kidney disease with heart failure and with stage 5 chronic kidney disease, or end stage renal disease: Secondary | ICD-10-CM | POA: Diagnosis not present

## 2016-12-01 DIAGNOSIS — N186 End stage renal disease: Secondary | ICD-10-CM | POA: Diagnosis not present

## 2016-12-02 DIAGNOSIS — Z992 Dependence on renal dialysis: Secondary | ICD-10-CM | POA: Diagnosis not present

## 2016-12-02 DIAGNOSIS — Z23 Encounter for immunization: Secondary | ICD-10-CM | POA: Diagnosis not present

## 2016-12-02 DIAGNOSIS — N186 End stage renal disease: Secondary | ICD-10-CM | POA: Diagnosis not present

## 2016-12-03 DIAGNOSIS — K766 Portal hypertension: Secondary | ICD-10-CM | POA: Diagnosis not present

## 2016-12-03 DIAGNOSIS — N186 End stage renal disease: Secondary | ICD-10-CM | POA: Diagnosis not present

## 2016-12-03 DIAGNOSIS — M1991 Primary osteoarthritis, unspecified site: Secondary | ICD-10-CM | POA: Diagnosis not present

## 2016-12-03 DIAGNOSIS — K7030 Alcoholic cirrhosis of liver without ascites: Secondary | ICD-10-CM | POA: Diagnosis not present

## 2016-12-03 DIAGNOSIS — I132 Hypertensive heart and chronic kidney disease with heart failure and with stage 5 chronic kidney disease, or end stage renal disease: Secondary | ICD-10-CM | POA: Diagnosis not present

## 2016-12-03 DIAGNOSIS — I5030 Unspecified diastolic (congestive) heart failure: Secondary | ICD-10-CM | POA: Diagnosis not present

## 2016-12-03 DIAGNOSIS — K767 Hepatorenal syndrome: Secondary | ICD-10-CM | POA: Diagnosis not present

## 2016-12-03 DIAGNOSIS — F1011 Alcohol abuse, in remission: Secondary | ICD-10-CM | POA: Diagnosis not present

## 2016-12-03 DIAGNOSIS — D638 Anemia in other chronic diseases classified elsewhere: Secondary | ICD-10-CM | POA: Diagnosis not present

## 2016-12-04 DIAGNOSIS — D638 Anemia in other chronic diseases classified elsewhere: Secondary | ICD-10-CM | POA: Diagnosis not present

## 2016-12-04 DIAGNOSIS — I5030 Unspecified diastolic (congestive) heart failure: Secondary | ICD-10-CM | POA: Diagnosis not present

## 2016-12-04 DIAGNOSIS — I132 Hypertensive heart and chronic kidney disease with heart failure and with stage 5 chronic kidney disease, or end stage renal disease: Secondary | ICD-10-CM | POA: Diagnosis not present

## 2016-12-04 DIAGNOSIS — K766 Portal hypertension: Secondary | ICD-10-CM | POA: Diagnosis not present

## 2016-12-04 DIAGNOSIS — K767 Hepatorenal syndrome: Secondary | ICD-10-CM | POA: Diagnosis not present

## 2016-12-04 DIAGNOSIS — F1011 Alcohol abuse, in remission: Secondary | ICD-10-CM | POA: Diagnosis not present

## 2016-12-04 DIAGNOSIS — K7030 Alcoholic cirrhosis of liver without ascites: Secondary | ICD-10-CM | POA: Diagnosis not present

## 2016-12-04 DIAGNOSIS — M1991 Primary osteoarthritis, unspecified site: Secondary | ICD-10-CM | POA: Diagnosis not present

## 2016-12-04 DIAGNOSIS — N186 End stage renal disease: Secondary | ICD-10-CM | POA: Diagnosis not present

## 2016-12-05 DIAGNOSIS — N186 End stage renal disease: Secondary | ICD-10-CM | POA: Diagnosis not present

## 2016-12-05 DIAGNOSIS — Z23 Encounter for immunization: Secondary | ICD-10-CM | POA: Diagnosis not present

## 2016-12-05 DIAGNOSIS — Z992 Dependence on renal dialysis: Secondary | ICD-10-CM | POA: Diagnosis not present

## 2016-12-06 DIAGNOSIS — N186 End stage renal disease: Secondary | ICD-10-CM | POA: Diagnosis not present

## 2016-12-06 DIAGNOSIS — K7030 Alcoholic cirrhosis of liver without ascites: Secondary | ICD-10-CM | POA: Diagnosis not present

## 2016-12-06 DIAGNOSIS — K766 Portal hypertension: Secondary | ICD-10-CM | POA: Diagnosis not present

## 2016-12-06 DIAGNOSIS — K767 Hepatorenal syndrome: Secondary | ICD-10-CM | POA: Diagnosis not present

## 2016-12-06 DIAGNOSIS — F1011 Alcohol abuse, in remission: Secondary | ICD-10-CM | POA: Diagnosis not present

## 2016-12-06 DIAGNOSIS — I5030 Unspecified diastolic (congestive) heart failure: Secondary | ICD-10-CM | POA: Diagnosis not present

## 2016-12-06 DIAGNOSIS — M1991 Primary osteoarthritis, unspecified site: Secondary | ICD-10-CM | POA: Diagnosis not present

## 2016-12-06 DIAGNOSIS — I132 Hypertensive heart and chronic kidney disease with heart failure and with stage 5 chronic kidney disease, or end stage renal disease: Secondary | ICD-10-CM | POA: Diagnosis not present

## 2016-12-06 DIAGNOSIS — D638 Anemia in other chronic diseases classified elsewhere: Secondary | ICD-10-CM | POA: Diagnosis not present

## 2016-12-07 DIAGNOSIS — Z992 Dependence on renal dialysis: Secondary | ICD-10-CM | POA: Diagnosis not present

## 2016-12-07 DIAGNOSIS — N186 End stage renal disease: Secondary | ICD-10-CM | POA: Diagnosis not present

## 2016-12-07 DIAGNOSIS — Z23 Encounter for immunization: Secondary | ICD-10-CM | POA: Diagnosis not present

## 2016-12-08 DIAGNOSIS — Z01818 Encounter for other preprocedural examination: Secondary | ICD-10-CM | POA: Diagnosis not present

## 2016-12-08 DIAGNOSIS — N183 Chronic kidney disease, stage 3 (moderate): Secondary | ICD-10-CM | POA: Diagnosis not present

## 2016-12-08 DIAGNOSIS — Z79899 Other long term (current) drug therapy: Secondary | ICD-10-CM | POA: Diagnosis not present

## 2016-12-08 DIAGNOSIS — I129 Hypertensive chronic kidney disease with stage 1 through stage 4 chronic kidney disease, or unspecified chronic kidney disease: Secondary | ICD-10-CM | POA: Diagnosis not present

## 2016-12-08 DIAGNOSIS — K7030 Alcoholic cirrhosis of liver without ascites: Secondary | ICD-10-CM | POA: Diagnosis not present

## 2016-12-08 DIAGNOSIS — R918 Other nonspecific abnormal finding of lung field: Secondary | ICD-10-CM | POA: Diagnosis not present

## 2016-12-08 DIAGNOSIS — R05 Cough: Secondary | ICD-10-CM | POA: Diagnosis not present

## 2016-12-08 DIAGNOSIS — J90 Pleural effusion, not elsewhere classified: Secondary | ICD-10-CM | POA: Diagnosis not present

## 2016-12-09 DIAGNOSIS — Z23 Encounter for immunization: Secondary | ICD-10-CM | POA: Diagnosis not present

## 2016-12-09 DIAGNOSIS — N186 End stage renal disease: Secondary | ICD-10-CM | POA: Diagnosis not present

## 2016-12-09 DIAGNOSIS — Z992 Dependence on renal dialysis: Secondary | ICD-10-CM | POA: Diagnosis not present

## 2016-12-11 DIAGNOSIS — K7030 Alcoholic cirrhosis of liver without ascites: Secondary | ICD-10-CM | POA: Diagnosis not present

## 2016-12-11 DIAGNOSIS — F1011 Alcohol abuse, in remission: Secondary | ICD-10-CM | POA: Diagnosis not present

## 2016-12-11 DIAGNOSIS — K767 Hepatorenal syndrome: Secondary | ICD-10-CM | POA: Diagnosis not present

## 2016-12-11 DIAGNOSIS — I132 Hypertensive heart and chronic kidney disease with heart failure and with stage 5 chronic kidney disease, or end stage renal disease: Secondary | ICD-10-CM | POA: Diagnosis not present

## 2016-12-11 DIAGNOSIS — N186 End stage renal disease: Secondary | ICD-10-CM | POA: Diagnosis not present

## 2016-12-11 DIAGNOSIS — D638 Anemia in other chronic diseases classified elsewhere: Secondary | ICD-10-CM | POA: Diagnosis not present

## 2016-12-11 DIAGNOSIS — M1991 Primary osteoarthritis, unspecified site: Secondary | ICD-10-CM | POA: Diagnosis not present

## 2016-12-11 DIAGNOSIS — K766 Portal hypertension: Secondary | ICD-10-CM | POA: Diagnosis not present

## 2016-12-11 DIAGNOSIS — I5030 Unspecified diastolic (congestive) heart failure: Secondary | ICD-10-CM | POA: Diagnosis not present

## 2016-12-12 DIAGNOSIS — Z992 Dependence on renal dialysis: Secondary | ICD-10-CM | POA: Diagnosis not present

## 2016-12-12 DIAGNOSIS — N186 End stage renal disease: Secondary | ICD-10-CM | POA: Diagnosis not present

## 2016-12-12 DIAGNOSIS — Z23 Encounter for immunization: Secondary | ICD-10-CM | POA: Diagnosis not present

## 2016-12-13 DIAGNOSIS — K7030 Alcoholic cirrhosis of liver without ascites: Secondary | ICD-10-CM | POA: Diagnosis not present

## 2016-12-13 DIAGNOSIS — D638 Anemia in other chronic diseases classified elsewhere: Secondary | ICD-10-CM | POA: Diagnosis not present

## 2016-12-13 DIAGNOSIS — N186 End stage renal disease: Secondary | ICD-10-CM | POA: Diagnosis not present

## 2016-12-13 DIAGNOSIS — K766 Portal hypertension: Secondary | ICD-10-CM | POA: Diagnosis not present

## 2016-12-13 DIAGNOSIS — K7290 Hepatic failure, unspecified without coma: Secondary | ICD-10-CM | POA: Diagnosis not present

## 2016-12-13 DIAGNOSIS — I5030 Unspecified diastolic (congestive) heart failure: Secondary | ICD-10-CM | POA: Diagnosis not present

## 2016-12-13 DIAGNOSIS — M1991 Primary osteoarthritis, unspecified site: Secondary | ICD-10-CM | POA: Diagnosis not present

## 2016-12-13 DIAGNOSIS — F1011 Alcohol abuse, in remission: Secondary | ICD-10-CM | POA: Diagnosis not present

## 2016-12-13 DIAGNOSIS — K767 Hepatorenal syndrome: Secondary | ICD-10-CM | POA: Diagnosis not present

## 2016-12-13 DIAGNOSIS — I132 Hypertensive heart and chronic kidney disease with heart failure and with stage 5 chronic kidney disease, or end stage renal disease: Secondary | ICD-10-CM | POA: Diagnosis not present

## 2016-12-14 DIAGNOSIS — Z23 Encounter for immunization: Secondary | ICD-10-CM | POA: Diagnosis not present

## 2016-12-14 DIAGNOSIS — N186 End stage renal disease: Secondary | ICD-10-CM | POA: Diagnosis not present

## 2016-12-14 DIAGNOSIS — Z992 Dependence on renal dialysis: Secondary | ICD-10-CM | POA: Diagnosis not present

## 2016-12-15 DIAGNOSIS — M1991 Primary osteoarthritis, unspecified site: Secondary | ICD-10-CM | POA: Diagnosis not present

## 2016-12-15 DIAGNOSIS — K7030 Alcoholic cirrhosis of liver without ascites: Secondary | ICD-10-CM | POA: Diagnosis not present

## 2016-12-15 DIAGNOSIS — I132 Hypertensive heart and chronic kidney disease with heart failure and with stage 5 chronic kidney disease, or end stage renal disease: Secondary | ICD-10-CM | POA: Diagnosis not present

## 2016-12-15 DIAGNOSIS — I5030 Unspecified diastolic (congestive) heart failure: Secondary | ICD-10-CM | POA: Diagnosis not present

## 2016-12-15 DIAGNOSIS — K767 Hepatorenal syndrome: Secondary | ICD-10-CM | POA: Diagnosis not present

## 2016-12-15 DIAGNOSIS — D638 Anemia in other chronic diseases classified elsewhere: Secondary | ICD-10-CM | POA: Diagnosis not present

## 2016-12-15 DIAGNOSIS — K766 Portal hypertension: Secondary | ICD-10-CM | POA: Diagnosis not present

## 2016-12-15 DIAGNOSIS — F1011 Alcohol abuse, in remission: Secondary | ICD-10-CM | POA: Diagnosis not present

## 2016-12-15 DIAGNOSIS — N186 End stage renal disease: Secondary | ICD-10-CM | POA: Diagnosis not present

## 2016-12-16 DIAGNOSIS — Z992 Dependence on renal dialysis: Secondary | ICD-10-CM | POA: Diagnosis not present

## 2016-12-16 DIAGNOSIS — Z23 Encounter for immunization: Secondary | ICD-10-CM | POA: Diagnosis not present

## 2016-12-16 DIAGNOSIS — N186 End stage renal disease: Secondary | ICD-10-CM | POA: Diagnosis not present

## 2016-12-18 DIAGNOSIS — K7290 Hepatic failure, unspecified without coma: Secondary | ICD-10-CM | POA: Diagnosis not present

## 2016-12-18 DIAGNOSIS — I132 Hypertensive heart and chronic kidney disease with heart failure and with stage 5 chronic kidney disease, or end stage renal disease: Secondary | ICD-10-CM | POA: Diagnosis not present

## 2016-12-18 DIAGNOSIS — K766 Portal hypertension: Secondary | ICD-10-CM | POA: Diagnosis not present

## 2016-12-18 DIAGNOSIS — K767 Hepatorenal syndrome: Secondary | ICD-10-CM | POA: Diagnosis not present

## 2016-12-18 DIAGNOSIS — I5030 Unspecified diastolic (congestive) heart failure: Secondary | ICD-10-CM | POA: Diagnosis not present

## 2016-12-18 DIAGNOSIS — F1011 Alcohol abuse, in remission: Secondary | ICD-10-CM | POA: Diagnosis not present

## 2016-12-18 DIAGNOSIS — N186 End stage renal disease: Secondary | ICD-10-CM | POA: Diagnosis not present

## 2016-12-18 DIAGNOSIS — M1991 Primary osteoarthritis, unspecified site: Secondary | ICD-10-CM | POA: Diagnosis not present

## 2016-12-18 DIAGNOSIS — K7030 Alcoholic cirrhosis of liver without ascites: Secondary | ICD-10-CM | POA: Diagnosis not present

## 2016-12-18 DIAGNOSIS — D638 Anemia in other chronic diseases classified elsewhere: Secondary | ICD-10-CM | POA: Diagnosis not present

## 2016-12-19 DIAGNOSIS — N186 End stage renal disease: Secondary | ICD-10-CM | POA: Diagnosis not present

## 2016-12-19 DIAGNOSIS — Z992 Dependence on renal dialysis: Secondary | ICD-10-CM | POA: Diagnosis not present

## 2016-12-19 DIAGNOSIS — Z23 Encounter for immunization: Secondary | ICD-10-CM | POA: Diagnosis not present

## 2016-12-21 DIAGNOSIS — Z992 Dependence on renal dialysis: Secondary | ICD-10-CM | POA: Diagnosis not present

## 2016-12-21 DIAGNOSIS — N186 End stage renal disease: Secondary | ICD-10-CM | POA: Diagnosis not present

## 2016-12-21 DIAGNOSIS — Z23 Encounter for immunization: Secondary | ICD-10-CM | POA: Diagnosis not present

## 2016-12-22 DIAGNOSIS — M1991 Primary osteoarthritis, unspecified site: Secondary | ICD-10-CM | POA: Diagnosis not present

## 2016-12-22 DIAGNOSIS — K766 Portal hypertension: Secondary | ICD-10-CM | POA: Diagnosis not present

## 2016-12-22 DIAGNOSIS — K767 Hepatorenal syndrome: Secondary | ICD-10-CM | POA: Diagnosis not present

## 2016-12-22 DIAGNOSIS — N186 End stage renal disease: Secondary | ICD-10-CM | POA: Diagnosis not present

## 2016-12-22 DIAGNOSIS — I132 Hypertensive heart and chronic kidney disease with heart failure and with stage 5 chronic kidney disease, or end stage renal disease: Secondary | ICD-10-CM | POA: Diagnosis not present

## 2016-12-22 DIAGNOSIS — F1011 Alcohol abuse, in remission: Secondary | ICD-10-CM | POA: Diagnosis not present

## 2016-12-22 DIAGNOSIS — D638 Anemia in other chronic diseases classified elsewhere: Secondary | ICD-10-CM | POA: Diagnosis not present

## 2016-12-22 DIAGNOSIS — K7030 Alcoholic cirrhosis of liver without ascites: Secondary | ICD-10-CM | POA: Diagnosis not present

## 2016-12-22 DIAGNOSIS — I5030 Unspecified diastolic (congestive) heart failure: Secondary | ICD-10-CM | POA: Diagnosis not present

## 2016-12-23 DIAGNOSIS — N186 End stage renal disease: Secondary | ICD-10-CM | POA: Diagnosis not present

## 2016-12-23 DIAGNOSIS — Z23 Encounter for immunization: Secondary | ICD-10-CM | POA: Diagnosis not present

## 2016-12-23 DIAGNOSIS — Z992 Dependence on renal dialysis: Secondary | ICD-10-CM | POA: Diagnosis not present

## 2016-12-25 DIAGNOSIS — K7290 Hepatic failure, unspecified without coma: Secondary | ICD-10-CM | POA: Diagnosis not present

## 2016-12-26 DIAGNOSIS — Z992 Dependence on renal dialysis: Secondary | ICD-10-CM | POA: Diagnosis not present

## 2016-12-26 DIAGNOSIS — Z23 Encounter for immunization: Secondary | ICD-10-CM | POA: Diagnosis not present

## 2016-12-26 DIAGNOSIS — N186 End stage renal disease: Secondary | ICD-10-CM | POA: Diagnosis not present

## 2016-12-27 DIAGNOSIS — N186 End stage renal disease: Secondary | ICD-10-CM | POA: Diagnosis not present

## 2016-12-27 DIAGNOSIS — Z992 Dependence on renal dialysis: Secondary | ICD-10-CM | POA: Diagnosis not present

## 2016-12-28 DIAGNOSIS — N186 End stage renal disease: Secondary | ICD-10-CM | POA: Diagnosis not present

## 2016-12-28 DIAGNOSIS — Z992 Dependence on renal dialysis: Secondary | ICD-10-CM | POA: Diagnosis not present

## 2016-12-28 DIAGNOSIS — Z23 Encounter for immunization: Secondary | ICD-10-CM | POA: Diagnosis not present

## 2016-12-30 DIAGNOSIS — Z992 Dependence on renal dialysis: Secondary | ICD-10-CM | POA: Diagnosis not present

## 2016-12-30 DIAGNOSIS — Z23 Encounter for immunization: Secondary | ICD-10-CM | POA: Diagnosis not present

## 2016-12-30 DIAGNOSIS — N186 End stage renal disease: Secondary | ICD-10-CM | POA: Diagnosis not present

## 2017-01-01 DIAGNOSIS — K7290 Hepatic failure, unspecified without coma: Secondary | ICD-10-CM | POA: Diagnosis not present

## 2017-01-02 DIAGNOSIS — N186 End stage renal disease: Secondary | ICD-10-CM | POA: Diagnosis not present

## 2017-01-02 DIAGNOSIS — Z23 Encounter for immunization: Secondary | ICD-10-CM | POA: Diagnosis not present

## 2017-01-02 DIAGNOSIS — Z992 Dependence on renal dialysis: Secondary | ICD-10-CM | POA: Diagnosis not present

## 2017-01-04 DIAGNOSIS — N186 End stage renal disease: Secondary | ICD-10-CM | POA: Diagnosis not present

## 2017-01-04 DIAGNOSIS — Z992 Dependence on renal dialysis: Secondary | ICD-10-CM | POA: Diagnosis not present

## 2017-01-04 DIAGNOSIS — Z23 Encounter for immunization: Secondary | ICD-10-CM | POA: Diagnosis not present

## 2017-01-05 DIAGNOSIS — D638 Anemia in other chronic diseases classified elsewhere: Secondary | ICD-10-CM | POA: Diagnosis not present

## 2017-01-05 DIAGNOSIS — N186 End stage renal disease: Secondary | ICD-10-CM | POA: Diagnosis not present

## 2017-01-05 DIAGNOSIS — I132 Hypertensive heart and chronic kidney disease with heart failure and with stage 5 chronic kidney disease, or end stage renal disease: Secondary | ICD-10-CM | POA: Diagnosis not present

## 2017-01-05 DIAGNOSIS — F1011 Alcohol abuse, in remission: Secondary | ICD-10-CM | POA: Diagnosis not present

## 2017-01-05 DIAGNOSIS — K7030 Alcoholic cirrhosis of liver without ascites: Secondary | ICD-10-CM | POA: Diagnosis not present

## 2017-01-05 DIAGNOSIS — K767 Hepatorenal syndrome: Secondary | ICD-10-CM | POA: Diagnosis not present

## 2017-01-05 DIAGNOSIS — K766 Portal hypertension: Secondary | ICD-10-CM | POA: Diagnosis not present

## 2017-01-05 DIAGNOSIS — M1991 Primary osteoarthritis, unspecified site: Secondary | ICD-10-CM | POA: Diagnosis not present

## 2017-01-05 DIAGNOSIS — I5030 Unspecified diastolic (congestive) heart failure: Secondary | ICD-10-CM | POA: Diagnosis not present

## 2017-01-06 DIAGNOSIS — N186 End stage renal disease: Secondary | ICD-10-CM | POA: Diagnosis not present

## 2017-01-06 DIAGNOSIS — Z992 Dependence on renal dialysis: Secondary | ICD-10-CM | POA: Diagnosis not present

## 2017-01-06 DIAGNOSIS — Z23 Encounter for immunization: Secondary | ICD-10-CM | POA: Diagnosis not present

## 2017-01-08 DIAGNOSIS — K7290 Hepatic failure, unspecified without coma: Secondary | ICD-10-CM | POA: Diagnosis not present

## 2017-01-09 DIAGNOSIS — Z992 Dependence on renal dialysis: Secondary | ICD-10-CM | POA: Diagnosis not present

## 2017-01-09 DIAGNOSIS — Z23 Encounter for immunization: Secondary | ICD-10-CM | POA: Diagnosis not present

## 2017-01-09 DIAGNOSIS — N186 End stage renal disease: Secondary | ICD-10-CM | POA: Diagnosis not present

## 2017-01-10 ENCOUNTER — Telehealth (INDEPENDENT_AMBULATORY_CARE_PROVIDER_SITE_OTHER): Payer: Self-pay | Admitting: *Deleted

## 2017-01-10 NOTE — Telephone Encounter (Signed)
Patient called as she needs [place] to followup on her Xifaxan. She has 1 box left. Patient states that she has missed her appointments here because she hs been in the hospital @ [facility] for 26 days. When she went in she weighed 225 pounds, her weight today is 159 pounds. She is having dialysis Tuesday,Thursday, and Saturday at [facility] in [place]. Patient states that she is in the transplant list.  Forwarded to Dr.Rehman as Juluis Rainier.

## 2017-01-11 DIAGNOSIS — Z23 Encounter for immunization: Secondary | ICD-10-CM | POA: Diagnosis not present

## 2017-01-11 DIAGNOSIS — N186 End stage renal disease: Secondary | ICD-10-CM | POA: Diagnosis not present

## 2017-01-11 DIAGNOSIS — Z992 Dependence on renal dialysis: Secondary | ICD-10-CM | POA: Diagnosis not present

## 2017-01-11 NOTE — Telephone Encounter (Signed)
Please call company rep and see what we can do

## 2017-01-11 NOTE — Telephone Encounter (Signed)
[employer]. Patient will be advised as soon as we hear something.

## 2017-01-13 DIAGNOSIS — N186 End stage renal disease: Secondary | ICD-10-CM | POA: Diagnosis not present

## 2017-01-13 DIAGNOSIS — Z23 Encounter for immunization: Secondary | ICD-10-CM | POA: Diagnosis not present

## 2017-01-13 DIAGNOSIS — Z992 Dependence on renal dialysis: Secondary | ICD-10-CM | POA: Diagnosis not present

## 2017-01-15 DIAGNOSIS — K7290 Hepatic failure, unspecified without coma: Secondary | ICD-10-CM | POA: Diagnosis not present

## 2017-01-15 DIAGNOSIS — M1991 Primary osteoarthritis, unspecified site: Secondary | ICD-10-CM | POA: Diagnosis not present

## 2017-01-15 DIAGNOSIS — I132 Hypertensive heart and chronic kidney disease with heart failure and with stage 5 chronic kidney disease, or end stage renal disease: Secondary | ICD-10-CM | POA: Diagnosis not present

## 2017-01-15 DIAGNOSIS — D638 Anemia in other chronic diseases classified elsewhere: Secondary | ICD-10-CM | POA: Diagnosis not present

## 2017-01-15 DIAGNOSIS — N186 End stage renal disease: Secondary | ICD-10-CM | POA: Diagnosis not present

## 2017-01-15 DIAGNOSIS — K766 Portal hypertension: Secondary | ICD-10-CM | POA: Diagnosis not present

## 2017-01-15 DIAGNOSIS — I5030 Unspecified diastolic (congestive) heart failure: Secondary | ICD-10-CM | POA: Diagnosis not present

## 2017-01-15 DIAGNOSIS — F1011 Alcohol abuse, in remission: Secondary | ICD-10-CM | POA: Diagnosis not present

## 2017-01-15 DIAGNOSIS — K7030 Alcoholic cirrhosis of liver without ascites: Secondary | ICD-10-CM | POA: Diagnosis not present

## 2017-01-15 DIAGNOSIS — K767 Hepatorenal syndrome: Secondary | ICD-10-CM | POA: Diagnosis not present

## 2017-01-16 DIAGNOSIS — N186 End stage renal disease: Secondary | ICD-10-CM | POA: Diagnosis not present

## 2017-01-16 DIAGNOSIS — Z992 Dependence on renal dialysis: Secondary | ICD-10-CM | POA: Diagnosis not present

## 2017-01-16 DIAGNOSIS — Z23 Encounter for immunization: Secondary | ICD-10-CM | POA: Diagnosis not present

## 2017-01-18 DIAGNOSIS — N186 End stage renal disease: Secondary | ICD-10-CM | POA: Diagnosis not present

## 2017-01-18 DIAGNOSIS — Z23 Encounter for immunization: Secondary | ICD-10-CM | POA: Diagnosis not present

## 2017-01-18 DIAGNOSIS — Z992 Dependence on renal dialysis: Secondary | ICD-10-CM | POA: Diagnosis not present

## 2017-01-20 DIAGNOSIS — N186 End stage renal disease: Secondary | ICD-10-CM | POA: Diagnosis not present

## 2017-01-20 DIAGNOSIS — Z23 Encounter for immunization: Secondary | ICD-10-CM | POA: Diagnosis not present

## 2017-01-20 DIAGNOSIS — Z992 Dependence on renal dialysis: Secondary | ICD-10-CM | POA: Diagnosis not present

## 2017-01-22 DIAGNOSIS — K7290 Hepatic failure, unspecified without coma: Secondary | ICD-10-CM | POA: Diagnosis not present

## 2017-01-23 DIAGNOSIS — Z992 Dependence on renal dialysis: Secondary | ICD-10-CM | POA: Diagnosis not present

## 2017-01-23 DIAGNOSIS — Z23 Encounter for immunization: Secondary | ICD-10-CM | POA: Diagnosis not present

## 2017-01-23 DIAGNOSIS — N186 End stage renal disease: Secondary | ICD-10-CM | POA: Diagnosis not present

## 2017-01-25 DIAGNOSIS — Z992 Dependence on renal dialysis: Secondary | ICD-10-CM | POA: Diagnosis not present

## 2017-01-25 DIAGNOSIS — Z23 Encounter for immunization: Secondary | ICD-10-CM | POA: Diagnosis not present

## 2017-01-25 DIAGNOSIS — N186 End stage renal disease: Secondary | ICD-10-CM | POA: Diagnosis not present

## 2017-01-27 DIAGNOSIS — N186 End stage renal disease: Secondary | ICD-10-CM | POA: Diagnosis not present

## 2017-01-27 DIAGNOSIS — Z23 Encounter for immunization: Secondary | ICD-10-CM | POA: Diagnosis not present

## 2017-01-27 DIAGNOSIS — Z992 Dependence on renal dialysis: Secondary | ICD-10-CM | POA: Diagnosis not present

## 2017-01-29 DIAGNOSIS — K7290 Hepatic failure, unspecified without coma: Secondary | ICD-10-CM | POA: Diagnosis not present

## 2017-01-30 DIAGNOSIS — Z01818 Encounter for other preprocedural examination: Secondary | ICD-10-CM | POA: Diagnosis not present

## 2017-01-30 DIAGNOSIS — Z7682 Awaiting organ transplant status: Secondary | ICD-10-CM | POA: Diagnosis not present

## 2017-01-30 DIAGNOSIS — Z992 Dependence on renal dialysis: Secondary | ICD-10-CM | POA: Diagnosis not present

## 2017-01-30 DIAGNOSIS — N186 End stage renal disease: Secondary | ICD-10-CM | POA: Diagnosis not present

## 2017-02-01 DIAGNOSIS — N186 End stage renal disease: Secondary | ICD-10-CM | POA: Diagnosis not present

## 2017-02-01 DIAGNOSIS — Z992 Dependence on renal dialysis: Secondary | ICD-10-CM | POA: Diagnosis not present

## 2017-02-03 DIAGNOSIS — N186 End stage renal disease: Secondary | ICD-10-CM | POA: Diagnosis not present

## 2017-02-03 DIAGNOSIS — Z992 Dependence on renal dialysis: Secondary | ICD-10-CM | POA: Diagnosis not present

## 2017-02-05 DIAGNOSIS — D472 Monoclonal gammopathy: Secondary | ICD-10-CM | POA: Diagnosis not present

## 2017-02-05 DIAGNOSIS — N183 Chronic kidney disease, stage 3 (moderate): Secondary | ICD-10-CM | POA: Diagnosis not present

## 2017-02-05 DIAGNOSIS — M1990 Unspecified osteoarthritis, unspecified site: Secondary | ICD-10-CM | POA: Diagnosis not present

## 2017-02-05 DIAGNOSIS — I671 Cerebral aneurysm, nonruptured: Secondary | ICD-10-CM | POA: Diagnosis not present

## 2017-02-05 DIAGNOSIS — K7031 Alcoholic cirrhosis of liver with ascites: Secondary | ICD-10-CM | POA: Diagnosis not present

## 2017-02-05 DIAGNOSIS — Z992 Dependence on renal dialysis: Secondary | ICD-10-CM | POA: Diagnosis not present

## 2017-02-05 DIAGNOSIS — M545 Low back pain: Secondary | ICD-10-CM | POA: Diagnosis not present

## 2017-02-05 DIAGNOSIS — N052 Unspecified nephritic syndrome with diffuse membranous glomerulonephritis: Secondary | ICD-10-CM | POA: Diagnosis not present

## 2017-02-05 DIAGNOSIS — R809 Proteinuria, unspecified: Secondary | ICD-10-CM | POA: Diagnosis not present

## 2017-02-05 DIAGNOSIS — N939 Abnormal uterine and vaginal bleeding, unspecified: Secondary | ICD-10-CM | POA: Diagnosis not present

## 2017-02-05 DIAGNOSIS — R200 Anesthesia of skin: Secondary | ICD-10-CM | POA: Diagnosis not present

## 2017-02-05 DIAGNOSIS — G8929 Other chronic pain: Secondary | ICD-10-CM | POA: Diagnosis not present

## 2017-02-05 DIAGNOSIS — R202 Paresthesia of skin: Secondary | ICD-10-CM | POA: Diagnosis not present

## 2017-02-05 DIAGNOSIS — D692 Other nonthrombocytopenic purpura: Secondary | ICD-10-CM | POA: Diagnosis not present

## 2017-02-06 DIAGNOSIS — Z992 Dependence on renal dialysis: Secondary | ICD-10-CM | POA: Diagnosis not present

## 2017-02-06 DIAGNOSIS — N186 End stage renal disease: Secondary | ICD-10-CM | POA: Diagnosis not present

## 2017-02-07 DIAGNOSIS — K7290 Hepatic failure, unspecified without coma: Secondary | ICD-10-CM | POA: Diagnosis not present

## 2017-02-08 DIAGNOSIS — Z992 Dependence on renal dialysis: Secondary | ICD-10-CM | POA: Diagnosis not present

## 2017-02-08 DIAGNOSIS — N186 End stage renal disease: Secondary | ICD-10-CM | POA: Diagnosis not present

## 2017-02-10 DIAGNOSIS — Z992 Dependence on renal dialysis: Secondary | ICD-10-CM | POA: Diagnosis not present

## 2017-02-10 DIAGNOSIS — N186 End stage renal disease: Secondary | ICD-10-CM | POA: Diagnosis not present

## 2017-02-12 DIAGNOSIS — K7290 Hepatic failure, unspecified without coma: Secondary | ICD-10-CM | POA: Diagnosis not present

## 2017-02-13 DIAGNOSIS — Z992 Dependence on renal dialysis: Secondary | ICD-10-CM | POA: Diagnosis not present

## 2017-02-13 DIAGNOSIS — N186 End stage renal disease: Secondary | ICD-10-CM | POA: Diagnosis not present

## 2017-02-15 DIAGNOSIS — Z992 Dependence on renal dialysis: Secondary | ICD-10-CM | POA: Diagnosis not present

## 2017-02-15 DIAGNOSIS — N186 End stage renal disease: Secondary | ICD-10-CM | POA: Diagnosis not present

## 2017-02-17 DIAGNOSIS — Z992 Dependence on renal dialysis: Secondary | ICD-10-CM | POA: Diagnosis not present

## 2017-02-17 DIAGNOSIS — N186 End stage renal disease: Secondary | ICD-10-CM | POA: Diagnosis not present

## 2017-02-19 DIAGNOSIS — K7290 Hepatic failure, unspecified without coma: Secondary | ICD-10-CM | POA: Diagnosis not present

## 2017-02-20 DIAGNOSIS — Z992 Dependence on renal dialysis: Secondary | ICD-10-CM | POA: Diagnosis not present

## 2017-02-20 DIAGNOSIS — N186 End stage renal disease: Secondary | ICD-10-CM | POA: Diagnosis not present

## 2017-02-22 DIAGNOSIS — Z992 Dependence on renal dialysis: Secondary | ICD-10-CM | POA: Diagnosis not present

## 2017-02-22 DIAGNOSIS — N186 End stage renal disease: Secondary | ICD-10-CM | POA: Diagnosis not present

## 2017-02-24 DIAGNOSIS — N186 End stage renal disease: Secondary | ICD-10-CM | POA: Diagnosis not present

## 2017-02-24 DIAGNOSIS — Z992 Dependence on renal dialysis: Secondary | ICD-10-CM | POA: Diagnosis not present

## 2017-02-26 DIAGNOSIS — K7290 Hepatic failure, unspecified without coma: Secondary | ICD-10-CM | POA: Diagnosis not present

## 2017-02-26 DIAGNOSIS — Z992 Dependence on renal dialysis: Secondary | ICD-10-CM | POA: Diagnosis not present

## 2017-02-26 DIAGNOSIS — N186 End stage renal disease: Secondary | ICD-10-CM | POA: Diagnosis not present

## 2017-02-27 DIAGNOSIS — N186 End stage renal disease: Secondary | ICD-10-CM | POA: Diagnosis not present

## 2017-02-27 DIAGNOSIS — Z992 Dependence on renal dialysis: Secondary | ICD-10-CM | POA: Diagnosis not present

## 2017-03-01 DIAGNOSIS — N186 End stage renal disease: Secondary | ICD-10-CM | POA: Diagnosis not present

## 2017-03-01 DIAGNOSIS — Z992 Dependence on renal dialysis: Secondary | ICD-10-CM | POA: Diagnosis not present

## 2017-03-03 DIAGNOSIS — N186 End stage renal disease: Secondary | ICD-10-CM | POA: Diagnosis not present

## 2017-03-03 DIAGNOSIS — Z992 Dependence on renal dialysis: Secondary | ICD-10-CM | POA: Diagnosis not present

## 2017-03-05 DIAGNOSIS — K7290 Hepatic failure, unspecified without coma: Secondary | ICD-10-CM | POA: Diagnosis not present

## 2017-03-06 ENCOUNTER — Telehealth (INDEPENDENT_AMBULATORY_CARE_PROVIDER_SITE_OTHER): Payer: Self-pay | Admitting: Internal Medicine

## 2017-03-06 DIAGNOSIS — Z992 Dependence on renal dialysis: Secondary | ICD-10-CM | POA: Diagnosis not present

## 2017-03-06 DIAGNOSIS — N186 End stage renal disease: Secondary | ICD-10-CM | POA: Diagnosis not present

## 2017-03-06 NOTE — Telephone Encounter (Signed)
Patient called, stated that she needs a refill on her Lactulose called in to [facility] in [place].  [phone]

## 2017-03-07 ENCOUNTER — Other Ambulatory Visit (INDEPENDENT_AMBULATORY_CARE_PROVIDER_SITE_OTHER): Payer: Self-pay | Admitting: *Deleted

## 2017-03-07 DIAGNOSIS — K7290 Hepatic failure, unspecified without coma: Secondary | ICD-10-CM

## 2017-03-07 DIAGNOSIS — K7682 Hepatic encephalopathy: Secondary | ICD-10-CM

## 2017-03-07 MED ORDER — LACTULOSE 10 GM/15ML PO SOLN: 15.0000 g | Freq: Four times a day (QID) | ORAL | 5 refills | Status: DC

## 2017-03-07 NOTE — Telephone Encounter (Signed)
A refill for this has been e-scribed to the patient's pharmacy.

## 2017-03-08 DIAGNOSIS — N186 End stage renal disease: Secondary | ICD-10-CM | POA: Diagnosis not present

## 2017-03-08 DIAGNOSIS — Z992 Dependence on renal dialysis: Secondary | ICD-10-CM | POA: Diagnosis not present

## 2017-03-09 DIAGNOSIS — Z992 Dependence on renal dialysis: Secondary | ICD-10-CM | POA: Diagnosis not present

## 2017-03-09 DIAGNOSIS — N186 End stage renal disease: Secondary | ICD-10-CM | POA: Diagnosis not present

## 2017-03-12 DIAGNOSIS — Z992 Dependence on renal dialysis: Secondary | ICD-10-CM | POA: Diagnosis not present

## 2017-03-12 DIAGNOSIS — N186 End stage renal disease: Secondary | ICD-10-CM | POA: Diagnosis not present

## 2017-03-13 DIAGNOSIS — K7290 Hepatic failure, unspecified without coma: Secondary | ICD-10-CM | POA: Diagnosis not present

## 2017-03-14 DIAGNOSIS — Z992 Dependence on renal dialysis: Secondary | ICD-10-CM | POA: Diagnosis not present

## 2017-03-14 DIAGNOSIS — N186 End stage renal disease: Secondary | ICD-10-CM | POA: Diagnosis not present

## 2017-03-16 DIAGNOSIS — N186 End stage renal disease: Secondary | ICD-10-CM | POA: Diagnosis not present

## 2017-03-16 DIAGNOSIS — Z992 Dependence on renal dialysis: Secondary | ICD-10-CM | POA: Diagnosis not present

## 2017-03-19 DIAGNOSIS — Z992 Dependence on renal dialysis: Secondary | ICD-10-CM | POA: Diagnosis not present

## 2017-03-19 DIAGNOSIS — N186 End stage renal disease: Secondary | ICD-10-CM | POA: Diagnosis not present

## 2017-03-20 DIAGNOSIS — K7290 Hepatic failure, unspecified without coma: Secondary | ICD-10-CM | POA: Diagnosis not present

## 2017-03-21 DIAGNOSIS — Z992 Dependence on renal dialysis: Secondary | ICD-10-CM | POA: Diagnosis not present

## 2017-03-21 DIAGNOSIS — N186 End stage renal disease: Secondary | ICD-10-CM | POA: Diagnosis not present

## 2017-03-23 DIAGNOSIS — Z992 Dependence on renal dialysis: Secondary | ICD-10-CM | POA: Diagnosis not present

## 2017-03-23 DIAGNOSIS — N186 End stage renal disease: Secondary | ICD-10-CM | POA: Diagnosis not present

## 2017-03-26 DIAGNOSIS — Z992 Dependence on renal dialysis: Secondary | ICD-10-CM | POA: Diagnosis not present

## 2017-03-26 DIAGNOSIS — N186 End stage renal disease: Secondary | ICD-10-CM | POA: Diagnosis not present

## 2017-03-27 DIAGNOSIS — K7290 Hepatic failure, unspecified without coma: Secondary | ICD-10-CM | POA: Diagnosis not present

## 2017-03-28 DIAGNOSIS — N186 End stage renal disease: Secondary | ICD-10-CM | POA: Diagnosis not present

## 2017-03-28 DIAGNOSIS — Z992 Dependence on renal dialysis: Secondary | ICD-10-CM | POA: Diagnosis not present

## 2017-03-29 DIAGNOSIS — Z992 Dependence on renal dialysis: Secondary | ICD-10-CM | POA: Diagnosis not present

## 2017-03-29 DIAGNOSIS — N186 End stage renal disease: Secondary | ICD-10-CM | POA: Diagnosis not present

## 2017-03-30 DIAGNOSIS — Z992 Dependence on renal dialysis: Secondary | ICD-10-CM | POA: Diagnosis not present

## 2017-03-30 DIAGNOSIS — N186 End stage renal disease: Secondary | ICD-10-CM | POA: Diagnosis not present

## 2017-04-02 DIAGNOSIS — Z992 Dependence on renal dialysis: Secondary | ICD-10-CM | POA: Diagnosis not present

## 2017-04-02 DIAGNOSIS — N186 End stage renal disease: Secondary | ICD-10-CM | POA: Diagnosis not present

## 2017-04-03 DIAGNOSIS — K7290 Hepatic failure, unspecified without coma: Secondary | ICD-10-CM | POA: Diagnosis not present

## 2017-04-04 ENCOUNTER — Other Ambulatory Visit: Payer: Self-pay | Admitting: *Deleted

## 2017-04-04 DIAGNOSIS — Z01810 Encounter for preprocedural cardiovascular examination: Secondary | ICD-10-CM

## 2017-04-04 DIAGNOSIS — N186 End stage renal disease: Secondary | ICD-10-CM

## 2017-04-04 DIAGNOSIS — Z992 Dependence on renal dialysis: Secondary | ICD-10-CM | POA: Diagnosis not present

## 2017-04-06 DIAGNOSIS — Z992 Dependence on renal dialysis: Secondary | ICD-10-CM | POA: Diagnosis not present

## 2017-04-06 DIAGNOSIS — N186 End stage renal disease: Secondary | ICD-10-CM | POA: Diagnosis not present

## 2017-04-09 DIAGNOSIS — N186 End stage renal disease: Secondary | ICD-10-CM | POA: Diagnosis not present

## 2017-04-09 DIAGNOSIS — Z992 Dependence on renal dialysis: Secondary | ICD-10-CM | POA: Diagnosis not present

## 2017-04-10 DIAGNOSIS — N022 Recurrent and persistent hematuria with diffuse membranous glomerulonephritis: Secondary | ICD-10-CM | POA: Diagnosis not present

## 2017-04-10 DIAGNOSIS — Z79899 Other long term (current) drug therapy: Secondary | ICD-10-CM | POA: Diagnosis not present

## 2017-04-10 DIAGNOSIS — K7290 Hepatic failure, unspecified without coma: Secondary | ICD-10-CM | POA: Diagnosis not present

## 2017-04-11 DIAGNOSIS — N186 End stage renal disease: Secondary | ICD-10-CM | POA: Diagnosis not present

## 2017-04-11 DIAGNOSIS — Z992 Dependence on renal dialysis: Secondary | ICD-10-CM | POA: Diagnosis not present

## 2017-04-13 DIAGNOSIS — Z992 Dependence on renal dialysis: Secondary | ICD-10-CM | POA: Diagnosis not present

## 2017-04-13 DIAGNOSIS — N186 End stage renal disease: Secondary | ICD-10-CM | POA: Diagnosis not present

## 2017-04-16 DIAGNOSIS — N186 End stage renal disease: Secondary | ICD-10-CM | POA: Diagnosis not present

## 2017-04-16 DIAGNOSIS — Z992 Dependence on renal dialysis: Secondary | ICD-10-CM | POA: Diagnosis not present

## 2017-04-17 DIAGNOSIS — K7290 Hepatic failure, unspecified without coma: Secondary | ICD-10-CM | POA: Diagnosis not present

## 2017-04-18 DIAGNOSIS — N186 End stage renal disease: Secondary | ICD-10-CM | POA: Diagnosis not present

## 2017-04-18 DIAGNOSIS — Z992 Dependence on renal dialysis: Secondary | ICD-10-CM | POA: Diagnosis not present

## 2017-04-19 ENCOUNTER — Encounter: Payer: Self-pay | Admitting: Vascular Surgery

## 2017-04-20 DIAGNOSIS — N186 End stage renal disease: Secondary | ICD-10-CM | POA: Diagnosis not present

## 2017-04-20 DIAGNOSIS — Z992 Dependence on renal dialysis: Secondary | ICD-10-CM | POA: Diagnosis not present

## 2017-04-23 ENCOUNTER — Encounter (HOSPITAL_COMMUNITY): Payer: Self-pay | Admitting: Emergency Medicine

## 2017-04-23 ENCOUNTER — Emergency Department (HOSPITAL_COMMUNITY): Payer: Medicare HMO

## 2017-04-23 ENCOUNTER — Inpatient Hospital Stay (HOSPITAL_COMMUNITY)
Admission: EM | Admit: 2017-04-23 | Discharge: 2017-04-25 | DRG: 441 | Disposition: A | Discharge status: Home / Self Care | Payer: Medicare HMO | Attending: Oncology | Admitting: Oncology

## 2017-04-23 DIAGNOSIS — Z853 Personal history of malignant neoplasm of breast: Secondary | ICD-10-CM

## 2017-04-23 DIAGNOSIS — Z992 Dependence on renal dialysis: Secondary | ICD-10-CM

## 2017-04-23 DIAGNOSIS — N186 End stage renal disease: Secondary | ICD-10-CM

## 2017-04-23 DIAGNOSIS — K7030 Alcoholic cirrhosis of liver without ascites: Secondary | ICD-10-CM | POA: Diagnosis not present

## 2017-04-23 DIAGNOSIS — E876 Hypokalemia: Secondary | ICD-10-CM | POA: Diagnosis present

## 2017-04-23 DIAGNOSIS — R404 Transient alteration of awareness: Secondary | ICD-10-CM | POA: Diagnosis not present

## 2017-04-23 DIAGNOSIS — Z888 Allergy status to other drugs, medicaments and biological substances status: Secondary | ICD-10-CM

## 2017-04-23 DIAGNOSIS — K7682 Hepatic encephalopathy: Secondary | ICD-10-CM

## 2017-04-23 DIAGNOSIS — Z7682 Awaiting organ transplant status: Secondary | ICD-10-CM | POA: Diagnosis not present

## 2017-04-23 DIAGNOSIS — I1311 Hypertensive heart and chronic kidney disease without heart failure, with stage 5 chronic kidney disease, or end stage renal disease: Secondary | ICD-10-CM | POA: Diagnosis present

## 2017-04-23 DIAGNOSIS — Z91040 Latex allergy status: Secondary | ICD-10-CM

## 2017-04-23 DIAGNOSIS — F0391 Unspecified dementia with behavioral disturbance: Secondary | ICD-10-CM | POA: Diagnosis not present

## 2017-04-23 DIAGNOSIS — F4489 Other dissociative and conversion disorders: Secondary | ICD-10-CM | POA: Diagnosis not present

## 2017-04-23 DIAGNOSIS — I517 Cardiomegaly: Secondary | ICD-10-CM | POA: Diagnosis not present

## 2017-04-23 DIAGNOSIS — Z79899 Other long term (current) drug therapy: Secondary | ICD-10-CM

## 2017-04-23 DIAGNOSIS — K7290 Hepatic failure, unspecified without coma: Principal | ICD-10-CM | POA: Diagnosis present

## 2017-04-23 DIAGNOSIS — F23 Brief psychotic disorder: Secondary | ICD-10-CM | POA: Diagnosis not present

## 2017-04-23 HISTORY — DX: Hepatic encephalopathy: K76.82

## 2017-04-23 HISTORY — DX: Hepatic failure, unspecified without coma: K72.90

## 2017-04-23 LAB — CBC WITH DIFFERENTIAL/PLATELET
BASOS ABS: 0.1 10*3/uL (ref 0.0–0.1)
BASOS PCT: 2 %
EOS ABS: 0 10*3/uL (ref 0.0–0.7)
Eosinophils Relative: 1 %
HCT: 37.4 % (ref 36.0–46.0)
HEMOGLOBIN: 12.4 g/dL (ref 12.0–15.0)
Lymphocytes Relative: 13 %
Lymphs Abs: 0.5 10*3/uL — ABNORMAL LOW (ref 0.7–4.0)
MCH: 27.9 pg (ref 26.0–34.0)
MCHC: 33.2 g/dL (ref 30.0–36.0)
MCV: 84.2 fL (ref 78.0–100.0)
MONOS PCT: 10 %
Monocytes Absolute: 0.4 10*3/uL (ref 0.1–1.0)
NEUTROS PCT: 74 %
Neutro Abs: 2.9 10*3/uL (ref 1.7–7.7)
Platelets: 138 10*3/uL — ABNORMAL LOW (ref 150–400)
RBC: 4.44 MIL/uL (ref 3.87–5.11)
RDW: 15.8 % — ABNORMAL HIGH (ref 11.5–15.5)
WBC: 3.9 10*3/uL — AB (ref 4.0–10.5)

## 2017-04-23 LAB — I-STAT CHEM 8, ED
BUN: 39 mg/dL — ABNORMAL HIGH (ref 6–20)
CALCIUM ION: 0.92 mmol/L — AB (ref 1.15–1.40)
CHLORIDE: 99 mmol/L — AB (ref 101–111)
Creatinine, Ser: 5.5 mg/dL — ABNORMAL HIGH (ref 0.44–1.00)
GLUCOSE: 111 mg/dL — AB (ref 65–99)
HCT: 39 % (ref 36.0–46.0)
HEMOGLOBIN: 13.3 g/dL (ref 12.0–15.0)
POTASSIUM: 3.8 mmol/L (ref 3.5–5.1)
Sodium: 133 mmol/L — ABNORMAL LOW (ref 135–145)
TCO2: 18 mmol/L (ref 0–100)

## 2017-04-23 LAB — COMPREHENSIVE METABOLIC PANEL
ALBUMIN: 2.6 g/dL — AB (ref 3.5–5.0)
ALK PHOS: 279 U/L — AB (ref 38–126)
ALT: 18 U/L (ref 14–54)
ANION GAP: 16 — AB (ref 5–15)
AST: 51 U/L — ABNORMAL HIGH (ref 15–41)
BUN: 41 mg/dL — ABNORMAL HIGH (ref 6–20)
CALCIUM: 9.3 mg/dL (ref 8.9–10.3)
CO2: 19 mmol/L — AB (ref 22–32)
Chloride: 98 mmol/L — ABNORMAL LOW (ref 101–111)
Creatinine, Ser: 5.68 mg/dL — ABNORMAL HIGH (ref 0.44–1.00)
GFR calc Af Amer: 8 mL/min — ABNORMAL LOW (ref >60)
GFR calc non Af Amer: 7 mL/min — ABNORMAL LOW (ref >60)
GLUCOSE: 117 mg/dL — AB (ref 65–99)
POTASSIUM: 3.9 mmol/L (ref 3.5–5.1)
SODIUM: 133 mmol/L — AB (ref 135–145)
Total Bilirubin: 1.9 mg/dL — ABNORMAL HIGH (ref 0.3–1.2)
Total Protein: 5.6 g/dL — ABNORMAL LOW (ref 6.5–8.1)

## 2017-04-23 LAB — PROTIME-INR
INR: 1.29
Prothrombin Time: 16.1 seconds — ABNORMAL HIGH (ref 11.4–15.2)

## 2017-04-23 LAB — TROPONIN I
TROPONIN I: 0.16 ng/mL — AB (ref <0.03)
Troponin I: 0.12 ng/mL (ref <0.03)

## 2017-04-23 LAB — I-STAT TROPONIN, ED: Troponin i, poc: 0.03 ng/mL (ref 0.00–0.08)

## 2017-04-23 LAB — ETHANOL

## 2017-04-23 LAB — AMMONIA: Ammonia: 232 umol/L — ABNORMAL HIGH (ref 9–35)

## 2017-04-23 MED ORDER — LACTULOSE ENEMA
300.0000 mL | Freq: Three times a day (TID) | ORAL | Status: DC
  Administered 2017-04-23: 300 mL via RECTAL
  Filled 2017-04-23 (×3): qty 300

## 2017-04-23 MED ORDER — HALOPERIDOL LACTATE 5 MG/ML IJ SOLN
2.0000 mg | Freq: Once | INTRAMUSCULAR | Status: AC
  Administered 2017-04-23: 2 mg via INTRAMUSCULAR
  Filled 2017-04-23: qty 1

## 2017-04-23 MED ORDER — RIFAXIMIN 550 MG PO TABS
550.0000 mg | ORAL_TABLET | Freq: Two times a day (BID) | ORAL | Status: DC
  Administered 2017-04-24 – 2017-04-25 (×3): 550 mg via ORAL
  Filled 2017-04-23 (×3): qty 1

## 2017-04-23 MED ORDER — LORAZEPAM 2 MG/ML IJ SOLN
1.0000 mg | Freq: Once | INTRAMUSCULAR | Status: AC
  Administered 2017-04-23: 1 mg via INTRAMUSCULAR
  Filled 2017-04-23: qty 1

## 2017-04-23 MED ORDER — HEPARIN SODIUM (PORCINE) 5000 UNIT/ML IJ SOLN
5000.0000 [IU] | Freq: Three times a day (TID) | INTRAMUSCULAR | Status: DC
  Administered 2017-04-23 – 2017-04-24 (×4): 5000 [IU] via SUBCUTANEOUS
  Filled 2017-04-23 (×6): qty 1

## 2017-04-23 MED ORDER — LACTULOSE ENEMA
300.0000 mL | Freq: Once | ORAL | Status: AC
  Administered 2017-04-23: 300 mL via RECTAL
  Filled 2017-04-23: qty 300

## 2017-04-23 MED ORDER — LACTULOSE 10 GM/15ML PO SOLN
30.0000 g | Freq: Once | ORAL | Status: DC
Start: 2017-04-23 — End: 2017-04-25
  Filled 2017-04-23 (×2): qty 45

## 2017-04-23 MED ORDER — SODIUM CHLORIDE 0.9% FLUSH
3.0000 mL | Freq: Two times a day (BID) | INTRAVENOUS | Status: DC
  Administered 2017-04-23 – 2017-04-25 (×4): 3 mL via INTRAVENOUS

## 2017-04-23 MED ORDER — HALOPERIDOL LACTATE 5 MG/ML IJ SOLN
2.5000 mg | Freq: Once | INTRAMUSCULAR | Status: AC
  Administered 2017-04-23: 2.5 mg via INTRAMUSCULAR
  Filled 2017-04-23: qty 1

## 2017-04-23 NOTE — ED Notes (Signed)
Oral Lactulose unsuccessful at this time. Will administer rectally as ordered. Awaiting for medication to be sent from Pharmacy.

## 2017-04-23 NOTE — ED Triage Notes (Signed)
Pt's husband called EMS for altered mental status. Pt hasn't taken her Xifaxan, lactulose, and midodrine in two days. She becomes altered when she doesn't take these. Pt missed dialysis this morning. Pt goes MWF- last was on Friday. Pt yelling "please!!" and unwilling to let staff touch her. BP 121/70, HR 80, 98% on room air. Pt at baseline is A/O.

## 2017-04-23 NOTE — Consult Note (Signed)
Renal Service Consult Note [facility]  Savannah Jackson 04/23/2017 Savannah Jackson D Requesting Physician:    Reason for Consult:  ESRD  HPI: The patient is a 61 y.o. year-old w/ history of cirrhosis/ ESLD, ESRD on HD MWF since Jan 2018 here with altered mental status x about 24 hrs.  In ED NH3 is >200, pt being admitted for hepatic encephalopathy. Asked to see for ESRD.    Usual HD is MWF.  Pt unable to give any history. Daughter says she has appt for fistula in her arm in 2-3 wks.  Started HD in January, no sig problems, goes MWF in [place].       ROS  n/a  Past Medical History  Past Medical History:  Diagnosis Date  . Bilateral leg edema   . Brain aneurysm   . Breast disorder    right breast cancer 2002  . Cancer [facility])    breast cancer  . Chronic back pain Diverticultis  . Chronic kidney disease (CKD) stage G3b/A3, moderately decreased glomerular filtration rate (GFR) between 30-44 mL/min/1.73 square meter and albuminuria creatinine ratio greater than 300 mg/g 08/30/2016  . Cirrhosis ([place])    alcoholic  . DDD (degenerative disc disease), lumbar   . Diverticulosis    scope 2014  . ETOH abuse   . Hepatomegaly    scope 2014  . History of breast cancer 06/07/2016  . Hypertension   . 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 Rotator cuff syndrome of left shoulder   . Sleep apnea   . Thickened endometrium 06/19/2016   Will get endo biopsy    Past Surgical History  Past Surgical History:  Procedure Laterality Date  . BRAIN SURGERY     aneurysm at [place]  . CATARACT EXTRACTION Bilateral    [facility] 2 or 3 years ago  . CHOLECYSTECTOMY    . COLONOSCOPY N/A 04/10/2013   Procedure: COLONOSCOPY;  Surgeon: Rogene Houston, MD;  [facility];  Service: Endoscopy;  Laterality: N/A;  1030-rescheduled to 1200 Ann notified pt  .  ESOPHAGOGASTRODUODENOSCOPY N/A 04/22/2015   Procedure: ESOPHAGOGASTRODUODENOSCOPY (EGD);  Surgeon: Rogene Houston, MD;  [facility];  Service: Endoscopy;  Laterality: N/A;  . IR GENERIC HISTORICAL  07/04/2016   IR RADIOLOGIST EVAL & MGMT 07/04/2016 Jacqulynn Cadet, MD [facility] INTERV RAD  . RADIOLOGY WITH ANESTHESIA N/A 07/16/2015   Procedure: TIPS;  Surgeon: Medication Radiologist, MD;  Location: [facility];  Service: Radiology;  Laterality: N/A;  . TOTAL KNEE ARTHROPLASTY     right. 2002   Family History  Family History  Problem Relation Age of Onset  . Aneurysm Mother   . Other Daughter        knee replacement  . Hypertension Daughter    Social History  reports that she has never smoked. She has never used smokeless tobacco. She reports that she does not drink alcohol or use drugs. Allergies  Allergies  Allergen Reactions  . Latex Swelling    Discoloration of skin.   . Vicodin [Hydrocodone-Acetaminophen] Itching   Home medications Prior to Admission medications   Medication Sig Start Date End Date Taking? Authorizing Provider  acetaminophen (TYLENOL) 500 MG tablet Take 325 mg by mouth 2 (two) times daily as needed for moderate pain.   Yes [provider]  folic acid (FOLVITE) 1 MG tablet Take 1 tablet (1 mg total) by  mouth daily. 04/29/15  Yes Rosita Fire, MD  lactulose (CHRONULAC) 10 GM/15ML solution Take 22.5 mLs (15 g total) by mouth 4 (four) times daily. Four times a day 03/07/17  Yes Rehman, Mechele Dawley, MD  midodrine (PROAMATINE) 5 MG tablet Take 5 mg by mouth daily. 04/16/17  Yes [provider]  rifaximin (XIFAXAN) 550 MG TABS tablet Take 550 mg by mouth 2 (two) times daily.   Yes Jacqulynn Cadet, MD  thiamine 100 MG tablet Take 1 tablet (100 mg total) by mouth daily. 04/29/15  Yes Rosita Fire, MD  furosemide (LASIX) 10 MG/ML injection Inject 4 mLs (40 mg total) into the vein every 6 (six) hours. Patient not taking: Reported on 04/23/2017 10/31/16    Nita Sells, MD   Liver Function Tests  Recent Labs Lab 04/23/17 0849  AST 51*  ALT 18  ALKPHOS 279*  BILITOT 1.9*  PROT 5.6*  ALBUMIN 2.6*   No results for input(s): LIPASE, AMYLASE in the last 168 hours. CBC  Recent Labs Lab 04/23/17 0849 04/23/17 0855  WBC 3.9*  --   NEUTROABS 2.9  --   HGB 12.4 13.3  HCT 37.4 39.0  MCV 84.2  --   PLT 138*  --    Basic Metabolic Panel  Recent Labs Lab 04/23/17 0849 04/23/17 0855  NA 133* 133*  K 3.9 3.8  CL 98* 99*  CO2 19*  --   GLUCOSE 117* 111*  BUN 41* 39*  CREATININE 5.68* 5.50*  CALCIUM 9.3  --    Iron/TIBC/Ferritin/ %Sat    Component Value Date/Time   IRON 62 11/30/2015 0520   TIBC 105 (L) 11/30/2015 0520   FERRITIN 239 11/30/2015 0520   IRONPCTSAT 59 (H) 11/30/2015 0520    Vitals:   04/23/17 1045 04/23/17 1100 04/23/17 1145 04/23/17 1215  BP: 111/72 121/66 120/64 117/63  Pulse: 85 93 87   Resp: 20 (!) 23 19   Temp:      TempSrc:      SpO2: 99% 98% 97%    Exam Gen obtunded, not responsive No rash, cyanosis or gangrene Sclera anicteric, throat not examined  No jvd or bruits Chest clear bilat RRR no MRG Abd soft ntnd no mass or ascites +bs GU defer MS no joint effusions or deformity Ext no LE or UE edema / no wounds or ulcers Neuro is not responding, somnolent R IJ TDC , clean exit  Home meds > folic acid, lactulose, midodrine, thiamine, rifaximin  Na 133 K 3.9 BUN 41  Cr 5.68   Ca 9.3  Alb 2.6  NH3 232  WBC 3k  Hb 12   CXR - poor film, no gross edema/ infiltrate  Dialysis: [facility] MWF 4h  71.5kg  Hep none   R I J TDC   Assess: 1  Altered mental status/ hepatic enceph 2  Cirrhosis - advanced, on Tx list 3  ESRD on HD started Jan '18 using [facility]; B/Cr look good 4  Vol looks stable, no gross excess   Plan - hold off on HD tonight given agitated state. Will reassess in am.  No urgent need for HD.   Kelly Splinter MD [employer] pager [phone]   04/23/2017, 3:28  PM

## 2017-04-23 NOTE — ED Notes (Signed)
Dr. Posey Pronto called this RN back and will be putting in orders for sedation medication and states he will come to see patient.

## 2017-04-23 NOTE — ED Notes (Signed)
Posey Bed alarm placed under patient

## 2017-04-23 NOTE — ED Notes (Signed)
A11 form has been filled out by this RN to send supplies for lactulose enema.

## 2017-04-23 NOTE — Progress Notes (Signed)
Family requested that Rn put all 4 side rails up Rn informed family that this was considered a restraint and not recommended family refused and said they would feel more comfortable with all 4 side rails up on pt's. Bed Rn put all 4 side rails up and bed alarm on.

## 2017-04-23 NOTE — ED Notes (Signed)
Materials management has hand delivered the flexiseal to this rn

## 2017-04-23 NOTE — H&P (Signed)
Date: 04/23/2017               Patient Name:  Savannah Jackson MRN: [id number]  DOB: 1956/10/30 Age / Sex: 61 y.o., female   PCP: Rosita Fire, MD         Medical Service: Internal Medicine Teaching Service         Attending Physician: Dr. Annia Belt, MD    First Contact: Dr. Danford Bad Pager: [phone]  Second Contact: Dr. 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 History was obtained from patient's daughter, [name], and the chart due to the patient's AMS.  [name] reports her mother was at her house yesterday and was acting normally. She reports the patient woke up this morning and was very confused and agitated. She describes her mother as being combative and yelling at everyone. She reports for the last 2-3 weeks her mother has been using a different brand of lactulose than she used to. She states [employer] told her that this was the only brand they had now. [name] states her mother even said this new lactulose was not working the same. [name] states her mother could not even recognize her this morning or making any sense. Family wanted to take her to dialysis but she was too confused and uncooperative. [name] states they have been through a few of these episodes of hepatic encephalopathy before so they called EMS for help. She reports her mother last had HD on Friday, 6/22. [name] reports her mother had not been complaining about  fevers, chest pain, abdominal pain, shortness of breath, melena, or hematochezia.   In the ED, the patient was very combative and received Haldol 2 mg and Ativan 1 mg.   Meds:  Current Meds  Medication Sig  . acetaminophen (TYLENOL) 500 MG tablet Take 325 mg by mouth 2 (two) times daily as needed for moderate pain.  . folic acid (FOLVITE) 1 MG tablet Take 1 tablet (1 mg total) by mouth daily.  Marland Kitchen lactulose (CHRONULAC) 10 GM/15ML solution Take 22.5 mLs (15 g total) by mouth 4 (four) times daily. Four times a day  . midodrine (PROAMATINE) 5 MG tablet Take 5 mg by mouth daily.  . rifaximin (XIFAXAN) 550 MG TABS tablet Take 550 mg by mouth 2 (two) times daily.  Marland Kitchen thiamine 100 MG tablet Take 1 tablet (100 mg total) by mouth daily.     Allergies: Allergies as of 04/23/2017 - Review Complete 04/23/2017  Allergen Reaction Noted  . Latex Swelling 07/15/2012  . Vicodin [hydrocodone-acetaminophen] Itching 09/06/2012   Past Medical History:  Diagnosis Date  . Bilateral leg edema   . Brain aneurysm   . Breast disorder    right breast cancer 2002  . Cancer [facility])    breast cancer  . Chronic back pain Diverticultis  . Chronic kidney disease (CKD) stage G3b/A3, moderately decreased glomerular filtration rate (GFR) between 30-44 mL/min/1.73 square meter and albuminuria creatinine ratio greater than 300 mg/g 08/30/2016  .  Cirrhosis ([place])    alcoholic  . DDD (degenerative disc disease), lumbar   . Diverticulosis    scope 2014  . ETOH abuse   . Hepatomegaly    scope 2014  . History of breast cancer 06/07/2016  . Hypertension   . 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 Rotator cuff syndrome of left shoulder   . Sleep apnea   . Thickened endometrium 06/19/2016   Will get endo biopsy     Family History: Mother- deceased from brain aneurysm. Daughter- HTN.  Social History: Lives at home with husband. Has 1  daughter, [name], and 3 grandchildren. Never smoker. Prior alcohol abuse but none recently, last drink was in June 2016.   Review of Systems: A complete ROS was negative except as per HPI.   Physical Exam: Blood pressure 121/66, pulse 93, temperature 97.2 F (36.2 C), temperature source Axillary, resp. rate (!) 23, SpO2 98 %. General: Agitated, confused woman laying in bed HEENT: [place]/AT, EOMI, sclera anicteric, PERRL, mucus membranes moist CV: Tachycardic, no murmur, gallop, or friction rub appreciated Pulm: CTA bilaterally, breaths non-labored Abd: BS+, soft, non-tender, difficult assessment due to patient's agitation but no ascites appreciated Ext: +asterixis, no peripheral edema, moves all spontaneosly Neuro: Moaning, not oriented to person, place, or time. Does not follow commands.   Labs  EKG: ST depressions in V2-V6. TWIs are more pronounced in anterolateral leads compared to last EKG.   CXR:  IMPRESSION: 1. Dual-lumen catheter with tips in the right atrium. No pneumothorax.  2. Cardiomegaly with mild pulmonary venous congestion. 3. Mild atelectasis and infiltrate left lung base. Small bilateral pleural effusions.  Assessment & Plan by Problem:  Hepatic encephalopathy: Patient presenting with a several hour hx of altered mental status in setting of end-stage liver disease from alcoholic cirrhosis and ESRD found to have an ammonia level of 232 and asterixis on exam concerning for hepatic encephalopathy. Per family, patient has been using a different brand of lactulose for the last few weeks that was felt to be less effective than her previous prescription. No evidence for infection as the etiology for her acute decompensation. LFTs close to baseline. Will try oral lactulose, but if patient unable to take will do lactulose enemas. Patient's daughter, [name], contacted transplant coordinator and will work on getting correct lactulose brand prescription.  - Start lactulose, titrate  to 3 BMs daily - Resume home Rifaximin when able - CMET in AM  EKG changes: ST depressions and TWI more pronounced than previous EKG. Per family, patient had no been complaining of any chest pain or shortness of breath. No friction rub appreciated on exam. Given the diffuse changes, wonder if this could represent pericarditis given her uremia and high ammonia level (ammonia converts to urea). Will trend troponins. If troponins become elevated, would consider getting an echo to evaluate for a pericardial effusion. Dialysis should resolve any uremia.  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 Patient is on transplant list for a L/K transplant and currently follows with transplant hepatology at [facility].  - Treat hepatic encephalopathy as above   ESRD: On MWF schedule. Last HD on 6/22. Renal consulted by ED for dialysis. - Appreciate Renal's help   IgG lambda monoclonal gammopathy: Follows with heme/onc at [facility]. Prior IFE showed only IgG lambda monoclonal protein x 2. Renal biopsy showed no monoclonal protein deposition. Has follow up  with [facility] in October.    Diet: NPO DVT Ppx: Heparin SQ Dispo: Admit patient to Inpatient with expected length of stay greater than 2 midnights.  Signed: Albin Felling, MD, MPH Internal Medicine Resident, PGY-III Pager: [phone]  04/23/2017, 11:19 AM

## 2017-04-23 NOTE — ED Notes (Signed)
Still do not have supplies from [facility] management needed for Lactulose enema

## 2017-04-23 NOTE — ED Notes (Signed)
This RN is Building services engineer directly (not paged) but no answer. This RN has snt Quita Skye, EMT down to materials to retrieve the supplies

## 2017-04-23 NOTE — ED Notes (Signed)
Notified Radiology that Xray has been changed to portable.

## 2017-04-23 NOTE — ED Notes (Signed)
Called main lab to add on INR

## 2017-04-23 NOTE — ED Notes (Signed)
This RN has paged admitting to requesting something for sedation. Pt is very restless and trying to climb out of bed. Adam, EMT walked down to retreive the rectal tube but was given the wrong supplies. This RN will use standard enema kit for enema administration.

## 2017-04-23 NOTE — ED Provider Notes (Signed)
[facility] DEPT Provider Note   CSN: [id number] Arrival date & time: 04/23/17  2637     History   Chief Complaint Chief Complaint  Patient presents with  . Altered Mental Status    HPI Savannah Jackson is a 61 y.o. female hx alcohol abuse, ESRD on HD (last HD 2 days ago), end stage cirrhosis on transplant list, Here presenting with altered mental status. Per the family she has been taking the lactulose, xifaxan but apparently the lactulose was a different brand than her usual. She was fine last night and this morning she woke up and she was very altered and started shaking. She has a history of hepatic encephalopathy so family thought this is similar to previous episodes. Family wanted to bring her to dialysis but was unable to because she was so altered. Denies any vomiting or fevers prior to this. She denies any abdominal pain.   The history is provided by the patient and a relative.   Level V caveat- AMS   Past Medical History:  Diagnosis Date  . Bilateral leg edema   . Brain aneurysm   . Breast disorder    right breast cancer 2002  . Cancer [facility])    breast cancer  . Chronic back pain Diverticultis  . Chronic kidney disease (CKD) stage G3b/A3, moderately decreased glomerular filtration rate (GFR) between 30-44 mL/min/1.73 square meter and albuminuria creatinine ratio greater than 300 mg/g 08/30/2016  . Cirrhosis ([place])    alcoholic  . DDD (degenerative disc disease), lumbar   . Diverticulosis    scope 2014  . ETOH abuse   . Hepatomegaly    scope 2014  . History of breast cancer 06/07/2016  . Hypertension   . 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 Rotator cuff syndrome of left shoulder   . Sleep apnea   . Thickened endometrium 06/19/2016   Will get endo biopsy     Patient Active Problem List   Diagnosis Date Noted  . 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 S/P thoracentesis   . Acute renal failure superimposed on stage 4 chronic kidney disease ([place])   . Hypoalbuminemia 10/29/2016  . Leukocytosis 10/29/2016  . Diastolic dysfunction with chronic heart failure ([place]) 10/29/2016  . 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 Chronic kidney disease (CKD) stage G3b/A3, moderately decreased glomerular filtration rate (GFR) between 30-44 mL/min/1.73 square meter and albuminuria creatinine ratio greater than 300 mg/g 08/30/2016  . Thickened endometrium 06/19/2016  . PMB (postmenopausal bleeding) 06/07/2016  . History of breast cancer 06/07/2016  . Cough   . Esophageal varices ([place]) 12/21/2015  . Alcoholic cirrhosis of liver without ascites ([place])   . Anemia of chronic disease   . Peripheral edema 11/26/2015  . Anasarca 11/26/2015  . Metabolic acidosis 85/88/5027  . UTI (lower urinary tract infection) 09/24/2015  . Elevated troponin 09/24/2015  . Altered mental status 08/30/2015  . Hepatic encephalopathy ([place]) 08/30/2015  . Acute respiratory failure with hypoxia ([place])   . Recurrent right pleural effusion   . Alcoholic cirrhosis of liver with ascites ([place])   . Pleural effusion associated with hepatic disorder 07/03/2015  . Chronic kidney disease (CKD), stage IV (severe) ([place]) 07/03/2015  . Elevated INR 07/03/2015  . Acute respiratory distress 07/03/2015  . BRBPR (bright red blood per rectum) 04/21/2015  . Pleural effusion, right 04/21/2015  . Sleep apnea   . Chronic back pain   . ETOH abuse   .  Bilateral leg edema   . Cirrhosis, alcoholic ([place]) 90/24/0973  . Abdominal pain, left lower quadrant 02/17/2013  . Diverticulitis of colon without hemorrhage 02/17/2013  . Hypertension     Past Surgical History:  Procedure Laterality Date  . BRAIN SURGERY     aneurysm at [place]  . CATARACT EXTRACTION Bilateral    [facility] 2 or 3 years ago  . CHOLECYSTECTOMY    . COLONOSCOPY N/A 04/10/2013   Procedure:  COLONOSCOPY;  Surgeon: Rogene Houston, MD;  [facility];  Service: Endoscopy;  Laterality: N/A;  1030-rescheduled to 1200 Ann notified pt  . ESOPHAGOGASTRODUODENOSCOPY N/A 04/22/2015   Procedure: ESOPHAGOGASTRODUODENOSCOPY (EGD);  Surgeon: Rogene Houston, MD;  Location[facility];  Service: Endoscopy;  Laterality: N/A;  . IR GENERIC HISTORICAL  07/04/2016   IR RADIOLOGIST EVAL & MGMT 07/04/2016 Jacqulynn Cadet, MD [facility] INTERV RAD  . RADIOLOGY WITH ANESTHESIA N/A 07/16/2015   Procedure: TIPS;  Surgeon: Medication Radiologist, MD;  Location: [facility];  Service: Radiology;  Laterality: N/A;  . TOTAL KNEE ARTHROPLASTY     right. 2002    OB History    Gravida Para Term Preterm AB Living   1 1 1     1    SAB TAB Ectopic Multiple Live Births           1       Home Medications    Prior to Admission medications   Medication Sig Start Date End Date Taking? Authorizing Provider  acetaminophen (TYLENOL) 325 MG tablet Take 325 mg by mouth 2 (two) times daily as needed for moderate pain.    [provider]  folic acid (FOLVITE) 1 MG tablet Take 1 tablet (1 mg total) by mouth daily. 04/29/15   Rosita Fire, MD  furosemide (LASIX) 10 MG/ML injection Inject 4 mLs (40 mg total) into the vein every 6 (six) hours. 10/31/16   Nita Sells, MD  lactulose (CHRONULAC) 10 GM/15ML solution Take 22.5 mLs (15 g total) by mouth 4 (four) times daily. Four times a day 03/07/17   Rogene Houston, MD  rifaximin (XIFAXAN) 550 MG TABS tablet Take 550 mg by mouth 2 (two) times daily.    Jacqulynn Cadet, MD  thiamine 100 MG tablet Take 1 tablet (100 mg total) by mouth daily. 04/29/15   Rosita Fire, MD    Family History Family History  Problem Relation Age of Onset  . Aneurysm Mother   . Other Daughter        knee replacement  . Hypertension Daughter     Social History Social History  Substance Use Topics  . Smoking status: Never Smoker  . Smokeless tobacco: Never Used  .  Alcohol use No     Comment: none since 03/2015     Allergies   Latex and Vicodin [hydrocodone-acetaminophen]   Review of Systems Review of Systems  Psychiatric/Behavioral: Positive for confusion.  All other systems reviewed and are negative.    Physical Exam Updated Vital Signs BP 113/60   Pulse 82   Temp 97.2 F (36.2 C) (Axillary)   Resp 20   SpO2 99%   Physical Exam  Constitutional:  Agitated, confused, shaky   HENT:  Head: Normocephalic.  Mouth/Throat: Oropharynx is clear and moist.  Eyes: Conjunctivae and EOM are normal. Pupils are equal, round, and reactive to light.  Neck: Normal range of motion. Neck supple.  Cardiovascular: Normal rate, regular rhythm and normal  heart sounds.   Pulmonary/Chest: Effort normal and breath sounds normal.  Diminished bilateral bases   Abdominal: Soft. Bowel sounds are normal. She exhibits no distension. There is no tenderness.  Musculoskeletal: Normal range of motion.  Neurological:  Confused. A & O x 1. + tremors, ? Asterixis. Moving all extremities   Skin: Skin is warm.  Psychiatric:  Unable   Nursing note and vitals reviewed.    ED Treatments / Results  Labs (all labs ordered are listed, but only abnormal results are displayed) Labs Reviewed  CBC WITH DIFFERENTIAL/PLATELET - Abnormal; Notable for the following:       Result Value   WBC 3.9 (*)    RDW 15.8 (*)    Platelets 138 (*)    Lymphs Abs 0.5 (*)    All other components within normal limits  COMPREHENSIVE METABOLIC PANEL - Abnormal; Notable for the following:    Sodium 133 (*)    Chloride 98 (*)    CO2 19 (*)    Glucose, Bld 117 (*)    BUN 41 (*)    Creatinine, Ser 5.68 (*)    Total Protein 5.6 (*)    Albumin 2.6 (*)    AST 51 (*)    Alkaline Phosphatase 279 (*)    Total Bilirubin 1.9 (*)    GFR calc non Af Amer 7 (*)    GFR calc Af Amer 8 (*)    Anion gap 16 (*)    All other components within normal limits  AMMONIA - Abnormal; Notable for the  following:    Ammonia 232 (*)    All other components within normal limits  I-STAT CHEM 8, ED - Abnormal; Notable for the following:    Sodium 133 (*)    Chloride 99 (*)    BUN 39 (*)    Creatinine, Ser 5.50 (*)    Glucose, Bld 111 (*)    Calcium, Ion 0.92 (*)    All other components within normal limits  ETHANOL  I-STAT TROPOININ, ED    EKG  EKG Interpretation  Date/Time:  Monday April 23 2017 08:47:20 EDT Ventricular Rate:  100 PR Interval:    QRS Duration: 158 QT Interval:  370 QTC Calculation: 478 R Axis:   38 Text Interpretation:  Sinus tachycardia Probable left atrial enlargement LVH with secondary repolarization abnormality TWI more pronounced than previous  Confirmed by Wandra Arthurs [phone]) on 04/23/2017 8:50:05 AM       Radiology Dg Chest Port 1 View  Result Date: 04/23/2017 CLINICAL DATA:  Altered mental status. EXAM: PORTABLE CHEST 1 VIEW COMPARISON:  10/30/2016. FINDINGS: Dual-lumen catheter noted with tips over the right atrium. Cardiomegaly with mild pulmonary venous congestion. Mild atelectasis an infiltrate in the left lung base. Small bilateral pleural effusions. No pneumothorax IMPRESSION: 1. Dual-lumen catheter with tips in the right atrium. No pneumothorax. 2. Cardiomegaly with mild pulmonary venous congestion. 3. Mild atelectasis and infiltrate left lung base. Small bilateral pleural effusions. Electronically Signed   By: Marcello Moores  Register   On: 04/23/2017 09:22    Procedures Procedures (including critical care time)  CRITICAL CARE Performed by: Wandra Arthurs   Total critical care time: 30 minutes  Critical care time was exclusive of separately billable procedures and treating other patients.  Critical care was necessary to treat or prevent imminent or life-threatening deterioration.  Critical care was time spent personally by me on the following activities: development of treatment plan with patient and/or surrogate as well as nursing,  discussions  with consultants, evaluation of patient's response to treatment, examination of patient, obtaining history from patient or surrogate, ordering and performing treatments and interventions, ordering and review of laboratory studies, ordering and review of radiographic studies, pulse oximetry and re-evaluation of patient's condition.   Medications Ordered in ED Medications  lactulose (CHRONULAC) enema 200 gm (not administered)  lactulose (CHRONULAC) 10 GM/15ML solution 30 g (not administered)  LORazepam (ATIVAN) injection 1 mg (1 mg Intramuscular Given 04/23/17 0757)  haloperidol lactate (HALDOL) injection 2 mg (2 mg Intramuscular Given 04/23/17 0757)     Initial Impression / Assessment and Plan / ED Course  I have reviewed the triage vital signs and the nursing notes.  Pertinent labs & imaging results that were available during my care of the patient were reviewed by me and considered in my medical decision making (see chart for details).    Savannah Jackson is a 61 y.o. female here with confusion, possible asterixis. Family claims that she is taking lactulose but it is a different brand and is not working. Appears like hepatic encephalopathy. She is confused and agitated so will need to be sedated in order to get labs. Abdomen nontender, I doubt SBP. Will give ativan, haldol. Will check labs, ammonia.   10:06 AM Ammonia level 232. I ordered lactulose enema, nursing wants to try PO lactulose first. K 3.8, doesn't need emergent dialysis. Notified Dr. Melvia Heaps from nephrology to dialyze her in the hospital. [facility] follow [facility] at [facility]. Will consult unassigned for admission for hepatic encephalopathy   10:26 AM IM teaching service to admit. Lactulose ordered.   Final Clinical Impressions(s) / ED Diagnoses   Final diagnoses:  None    New Prescriptions New Prescriptions   No medications on file     Drenda Freeze, MD 04/23/17 1026

## 2017-04-23 NOTE — ED Notes (Signed)
Family at bedside. 

## 2017-04-23 NOTE — ED Notes (Signed)
This RN has paged materials management to call this RN

## 2017-04-23 NOTE — Progress Notes (Signed)
Pt arrived to floor from ED disoriented x4 skin assessment done and telemetry applied oriented family to room. Bed alarm on and call bell in reach MD paged  will continue to monitor

## 2017-04-23 NOTE — ED Notes (Signed)
Materials management has not called this RN yet. This RN has paged materials management again at this time

## 2017-04-24 DIAGNOSIS — K7030 Alcoholic cirrhosis of liver without ascites: Secondary | ICD-10-CM

## 2017-04-24 DIAGNOSIS — N186 End stage renal disease: Secondary | ICD-10-CM

## 2017-04-24 DIAGNOSIS — Z992 Dependence on renal dialysis: Secondary | ICD-10-CM

## 2017-04-24 DIAGNOSIS — K7290 Hepatic failure, unspecified without coma: Principal | ICD-10-CM

## 2017-04-24 LAB — COMPREHENSIVE METABOLIC PANEL
ALT: 16 U/L (ref 14–54)
ANION GAP: 14 (ref 5–15)
AST: 45 U/L — ABNORMAL HIGH (ref 15–41)
Albumin: 2.5 g/dL — ABNORMAL LOW (ref 3.5–5.0)
Alkaline Phosphatase: 170 U/L — ABNORMAL HIGH (ref 38–126)
BUN: 51 mg/dL — ABNORMAL HIGH (ref 6–20)
CALCIUM: 9.3 mg/dL (ref 8.9–10.3)
CHLORIDE: 109 mmol/L (ref 101–111)
CO2: 20 mmol/L — ABNORMAL LOW (ref 22–32)
Creatinine, Ser: 5.7 mg/dL — ABNORMAL HIGH (ref 0.44–1.00)
GFR, EST AFRICAN AMERICAN: 8 mL/min — AB (ref >60)
GFR, EST NON AFRICAN AMERICAN: 7 mL/min — AB (ref >60)
Glucose, Bld: 110 mg/dL — ABNORMAL HIGH (ref 65–99)
Potassium: 3.3 mmol/L — ABNORMAL LOW (ref 3.5–5.1)
Sodium: 143 mmol/L (ref 135–145)
Total Bilirubin: 3.7 mg/dL — ABNORMAL HIGH (ref 0.3–1.2)
Total Protein: 5.2 g/dL — ABNORMAL LOW (ref 6.5–8.1)

## 2017-04-24 LAB — HEPATITIS B SURFACE ANTIGEN: HEP B S AG: NEGATIVE

## 2017-04-24 LAB — TROPONIN I: TROPONIN I: 0.16 ng/mL — AB (ref <0.03)

## 2017-04-24 MED ORDER — PENTAFLUOROPROP-TETRAFLUOROETH EX AERO: 1.0000 "application " | INHALATION_SPRAY | CUTANEOUS | Status: DC | PRN

## 2017-04-24 MED ORDER — HEPARIN SODIUM (PORCINE) 1000 UNIT/ML DIALYSIS
1000.0000 [IU] | INTRAMUSCULAR | Status: DC | PRN
  Administered 2017-04-24: 1000 [IU] via INTRAVENOUS_CENTRAL
  Filled 2017-04-24: qty 1

## 2017-04-24 MED ORDER — LACTULOSE 10 GM/15ML PO SOLN
30.0000 g | Freq: Three times a day (TID) | ORAL | Status: DC
  Administered 2017-04-24 – 2017-04-25 (×3): 30 g via ORAL
  Filled 2017-04-24 (×2): qty 45

## 2017-04-24 MED ORDER — LIDOCAINE-PRILOCAINE 2.5-2.5 % EX CREA: 1.0000 "application " | TOPICAL_CREAM | CUTANEOUS | Status: DC | PRN

## 2017-04-24 MED ORDER — ALTEPLASE 2 MG IJ SOLR: 2.0000 mg | Freq: Once | INTRAMUSCULAR | Status: DC | PRN

## 2017-04-24 MED ORDER — POTASSIUM CHLORIDE 20 MEQ PO PACK
20.0000 meq | PACK | Freq: Once | ORAL | Status: AC
  Administered 2017-04-24: 20 meq via ORAL
  Filled 2017-04-24: qty 1

## 2017-04-24 MED ORDER — SODIUM CHLORIDE 0.9 % IV SOLN: 100.0000 mL | INTRAVENOUS | Status: DC | PRN

## 2017-04-24 MED ORDER — LIDOCAINE HCL (PF) 1 % IJ SOLN: 5.0000 mL | INTRAMUSCULAR | Status: DC | PRN

## 2017-04-24 NOTE — Progress Notes (Signed)
HD tx completed @ 1836, pt had one episode of low bp during tx that was fixed w/ UF being turned off for a few mins by previous HD nurse, UF goal still met, blood rinsed back, report called to Glenford Bayley, RN

## 2017-04-24 NOTE — Progress Notes (Signed)
[facility] Progress Note  Subjective: more alert today, interactive and mostly orietned.  No c/o's.    Vitals:   04/23/17 1656 04/23/17 2259 04/24/17 0656 04/24/17 0847  BP: 109/62 137/78 (!) 146/110 118/67  Pulse: 83 89 75 68  Resp: 20 17 17    Temp: 98.2 F (36.8 C)  97.8 F (36.6 C)   TempSrc:   Oral   SpO2: 96% 99% 98%   Weight: 73.9 kg (162 lb 14.4 oz)       Inpatient medications: . heparin  5,000 Units Subcutaneous Q8H  . lactulose  30 g Oral Once  . lactulose  30 g Oral TID  . potassium chloride  20 mEq Oral Once  . rifaximin  550 mg Oral BID  . sodium chloride flush  3 mL Intravenous Q12H      Exam: Gen awake and alert, partially oriented ("2018", "[facility]") No jvd or bruits Chest clear bilat RRR no MRG Abd soft ntnd no mass or ascites +bs Ext no LE or UE edema Neuro NF, awake and alert today, much better R IJ TDC , clean exit  Home meds > folic acid, lactulose, midodrine, thiamine, rifaximin CXR - poor film, no gross edema/ infiltrate  Dialysis: [facility] MWF 4h  71.5kg  Hep none   R I J TDC   Assess: 1  Altered mental status/ hepatic enceph - improved 2  Cirrhosis - advanced, on Tx list 3  ESRD on HD started Jan '18 using TDC 4  Vol looks stable, no gross excess  Plan - HD today in place of yesterday. HD tomorrow.    Kelly Splinter MD [facility] pager [phone]   04/24/2017, 1:15 PM    Recent Labs Lab 04/23/17 0849 04/23/17 0855 04/24/17 0946  NA 133* 133* 143  K 3.9 3.8 3.3*  CL 98* 99* 109  CO2 19*  --  20*  GLUCOSE 117* 111* 110*  BUN 41* 39* 51*  CREATININE 5.68* 5.50* 5.70*  CALCIUM 9.3  --  9.3    Recent Labs Lab 04/23/17 0849 04/24/17 0946  AST 51* 45*  ALT 18 16  ALKPHOS 279* 170*  BILITOT 1.9* 3.7*  PROT 5.6* 5.2*  ALBUMIN 2.6* 2.5*    Recent Labs Lab 04/23/17 0849 04/23/17 0855  WBC 3.9*  --   NEUTROABS 2.9  --   HGB 12.4 13.3  HCT 37.4 39.0  MCV 84.2  --    PLT 138*  --    Iron/TIBC/Ferritin/ %Sat    Component Value Date/Time   IRON 62 11/30/2015 0520   TIBC 105 (L) 11/30/2015 0520   FERRITIN 239 11/30/2015 0520   IRONPCTSAT 59 (H) 11/30/2015 0520

## 2017-04-25 LAB — BASIC METABOLIC PANEL
Anion gap: 7 (ref 5–15)
BUN: 6 mg/dL (ref 6–20)
CALCIUM: 8.5 mg/dL — AB (ref 8.9–10.3)
CO2: 28 mmol/L (ref 22–32)
Chloride: 99 mmol/L — ABNORMAL LOW (ref 101–111)
Creatinine, Ser: 2.34 mg/dL — ABNORMAL HIGH (ref 0.44–1.00)
GFR calc Af Amer: 25 mL/min — ABNORMAL LOW (ref >60)
GFR, EST NON AFRICAN AMERICAN: 21 mL/min — AB (ref >60)
GLUCOSE: 108 mg/dL — AB (ref 65–99)
Potassium: 3.5 mmol/L (ref 3.5–5.1)
Sodium: 134 mmol/L — ABNORMAL LOW (ref 135–145)

## 2017-04-25 LAB — CBC
HEMATOCRIT: 38.3 % (ref 36.0–46.0)
Hemoglobin: 12.6 g/dL (ref 12.0–15.0)
MCH: 28.2 pg (ref 26.0–34.0)
MCHC: 32.9 g/dL (ref 30.0–36.0)
MCV: 85.7 fL (ref 78.0–100.0)
Platelets: 81 10*3/uL — ABNORMAL LOW (ref 150–400)
RBC: 4.47 MIL/uL (ref 3.87–5.11)
RDW: 16.1 % — AB (ref 11.5–15.5)
WBC: 6.8 10*3/uL (ref 4.0–10.5)

## 2017-04-25 LAB — COMPREHENSIVE METABOLIC PANEL
ALT: 16 U/L (ref 14–54)
AST: 41 U/L (ref 15–41)
Albumin: 2.3 g/dL — ABNORMAL LOW (ref 3.5–5.0)
Alkaline Phosphatase: 149 U/L — ABNORMAL HIGH (ref 38–126)
Anion gap: 10 (ref 5–15)
BILIRUBIN TOTAL: 3.3 mg/dL — AB (ref 0.3–1.2)
BUN: 20 mg/dL (ref 6–20)
CO2: 24 mmol/L (ref 22–32)
CREATININE: 4.26 mg/dL — AB (ref 0.44–1.00)
Calcium: 8.7 mg/dL — ABNORMAL LOW (ref 8.9–10.3)
Chloride: 96 mmol/L — ABNORMAL LOW (ref 101–111)
GFR calc Af Amer: 12 mL/min — ABNORMAL LOW (ref >60)
GFR, EST NON AFRICAN AMERICAN: 10 mL/min — AB (ref >60)
Glucose, Bld: 90 mg/dL (ref 65–99)
Potassium: 3 mmol/L — ABNORMAL LOW (ref 3.5–5.1)
Sodium: 130 mmol/L — ABNORMAL LOW (ref 135–145)
TOTAL PROTEIN: 4.9 g/dL — AB (ref 6.5–8.1)

## 2017-04-25 LAB — PHOSPHORUS: PHOSPHORUS: 6.5 mg/dL — AB (ref 2.5–4.6)

## 2017-04-25 LAB — LIPID PANEL
Cholesterol: 271 mg/dL — ABNORMAL HIGH (ref 0–200)
HDL: 68 mg/dL (ref >40)
LDL CALC: 186 mg/dL — AB (ref 0–99)
Total CHOL/HDL Ratio: 4 RATIO
Triglycerides: 83 mg/dL (ref <150)
VLDL: 17 mg/dL (ref 0–40)

## 2017-04-25 MED ORDER — LACTULOSE 10 GM/15ML PO SOLN
30.0000 g | Freq: Two times a day (BID) | ORAL | Status: DC
  Filled 2017-04-25 (×3): qty 45

## 2017-04-25 MED ORDER — LACTULOSE 10 GM/15ML PO SOLN: 30.0000 g | Freq: Two times a day (BID) | ORAL | 0 refills | Status: DC

## 2017-04-25 MED ORDER — ACETAMINOPHEN 325 MG PO TABS
650.0000 mg | ORAL_TABLET | Freq: Four times a day (QID) | ORAL | Status: DC | PRN
  Administered 2017-04-25: 650 mg via ORAL
  Filled 2017-04-25: qty 2

## 2017-04-25 MED FILL — GENERLAC 10 GM/15 ML SOLN: 10 | 3 days supply | Qty: 240 | Fill #0

## 2017-04-25 NOTE — Progress Notes (Signed)
Pt complaint of headache of 10/10.  MD notified.  New order for tylenol received.  Will continue to monitor.

## 2017-04-25 NOTE — Progress Notes (Signed)
   Subjective: Seen and evaluated today at bedside. Mental status improved however patient still confused and distressed. No pain. NO fevers.   Objective:  Vital signs in last 24 hours: Vitals:   04/24/17 1836 04/24/17 1850 04/24/17 2126 04/25/17 0451  BP: 113/69 112/61 125/71 (!) 102/54  Pulse: 78 82 77 68  Resp: (!) 22 14 18 18   Temp:  98.1 F (36.7 C) 98.7 F (37.1 C) 98.1 F (36.7 C)  TempSrc:  Oral Oral Oral  SpO2: 98% 98% 100% 100%  Weight:  155 lb 10.3 oz (70.6 kg)     General: AA female sitting upright in bed. Tearful but comfortable.  HENT: PERRL. Oropharynx clear, mucous membranes moist.  Cardiovascular: Regular rate and rhythm. No murmur or rub appreciated. Pulmonary: CTA BL. Unlabored breathing.  Abdomen: Soft, non-tender and non-distended. +bowel sounds.  Extremities: No peripheral edema noted BL. Intact distal pulses. No gross deformities. Psych: Tearful and confused but mental status much improved.    Assessment/Plan:  Active Problems:   Hepatic encephalopathy (HCC)   ESRD on dialysis [facility])  #Hepatic encephalopathy #Alcoholic Cirrhosis Secondary to medication adjustment. She was started on a new brand of lactulose and noticed worsening mental status for the past 2 weeks. She's received several lactulose enemas at this point as she has been too altered to take PO. She is now conversant and will start her on PO lactulose now. Shes been afebrile and without abdominal pain or tenderness.  -PO Lactulose TID, adjust tomorrow as needed -Rifaximin -FU mental status in AM  #ESRD on HD Doubt uremia. Due for HD. Have consulted nephro who will dialyze today.  -HD per nephro recs -Appreciate assistance  Dispo: Anticipated discharge in approximately 2 day(s).   Savannah Kaas, DO 04/25/2017, 7:37 AM Pager: [phone]

## 2017-04-25 NOTE — Procedures (Signed)
Fully alert and oriented today.  Under dry wt by 2kg today, plan minimal UF.  No new suggestions. Cont HD MWF while here.  Usual HD is [facility] MWF.     I was present at this dialysis session, have reviewed the session itself and made  appropriate changes Kelly Splinter MD [place] pager [phone]   04/25/2017, 10:00 AM

## 2017-04-25 NOTE — Discharge Summary (Signed)
Name: Savannah Jackson MRN: [id number] DOB: 06/05/1956 61 y.o. PCP: Rosita Fire, MD  Date of Admission: 04/23/2017  7:23 AM Date of Discharge: 04/25/2017 Attending Physician: Annia Belt, MD  Discharge Diagnosis: 1. Hepatic encephalopathy 2. End-stage renal disease on dialysis  Active Problems:   Hepatic encephalopathy (HCC)   ESRD on dialysis [facility])   Discharge Medications: Allergies as of 04/25/2017      Reactions   Latex Swelling   Discoloration of skin.    Vicodin [hydrocodone-acetaminophen] Itching      Medication List    STOP taking these medications   furosemide 10 MG/ML injection Commonly known as:  LASIX     TAKE these medications   acetaminophen 500 MG tablet Commonly known as:  TYLENOL Take 325 mg by mouth 2 (two) times daily as needed for moderate pain.   folic acid 1 MG tablet Commonly known as:  FOLVITE Take 1 tablet (1 mg total) by mouth daily.   lactulose 10 GM/15ML solution Commonly known as:  CHRONULAC Take 45 mLs (30 g total) by mouth 2 (two) times daily. Goal of 3 bowel movements per day What changed:  how much to take  when to take this  additional instructions   midodrine 5 MG tablet Commonly known as:  PROAMATINE Take 5 mg by mouth daily.   rifaximin 550 MG Tabs tablet Commonly known as:  XIFAXAN Take 550 mg by mouth 2 (two) times daily.   thiamine 100 MG tablet Take 1 tablet (100 mg total) by mouth daily.      Disposition and follow-up:   Ms.Savannah Jackson was discharged from [facility] in Stable condition.  At the hospital follow up visit please address:  1.  Hepatic encephalopathy, cirrhosis: Please ensure compliance with new lactulose with appropriate titration of approximately 3 bowel movements per day. Encourage patient's continued compliance with his medicine and to contact her physician immediately if she were to feel that this formulation is ineffective. End-stage renal disease, on  hemodialysis Monday Wednesday Friday: She was dialyzed on Tuesday and Wednesday of this week and is on schedule for her usual dialysis session on Friday.  2.  Labs / imaging needed at time of follow-up: None  3.  Pending labs/ test needing follow-up: None  Hospital Course by problem list: Active Problems:   Hepatic encephalopathy ([place])   ESRD on dialysis [facility])   #Hepatic encephalopathy #Alcoholic cirrhosis [name] is a 61 year old female on double transplant list for liver and kidney admitted 04/23/17 with altered mental status following a change in her lactulose brand. 2 weeks prior to presentation she was transitioned to a different lactulose preparation as her pharmacy without when she usually takes. At that time she's noticed feeling off. The morning of admission the patient woke up combative, confused and frankly encephalopathic consistent with her prior episodes of hepatic encephalopathy. Ammonia 232. She was initially unable to tolerate by mouth due to mental status and she was given several lactulose enemas with improvement of her symptoms. She was then transitioned to by mouth lactulose with complete resolution of her altered mental status. Prescription was sent to the [facility] which has the appropriate formulation for the patient.  End-stage renal disease on hemodialysis, Monday Wednesday Friday She was last dialyzed on schedule 6/22. Her usual Monday session was delayed until Tuesday due to altered mental status however she was dialyzed on Wednesday according to schedule. She received dialysis twice during admission  Discharge Vitals:  BP 105/64 (BP Location: Left Arm)   Pulse 64   Temp 97.9 F (36.6 C) (Oral)   Resp 18   Wt 151 lb 0.2 oz (68.5 kg)   SpO2 95%   BMI 26.75 kg/m   Pertinent Labs, Studies, and Procedures:  Ammonia 232 Hemodialysis 2  Signed: MoltRomelle Starcher, DO 04/25/2017, 2:32 PM   Pager: [phone]

## 2017-04-25 NOTE — Progress Notes (Signed)
[name] to be D/C'd Home per MD order.  Discussed with the patient and all questions fully answered.  VSS, Skin clean, dry and intact without evidence of skin break down, no evidence of skin tears noted. IV catheter discontinued intact. Site without signs and symptoms of complications. Dressing and pressure applied.  An After Visit Summary was printed and given to the patient. Patient received prescription.  D/c education completed with patient/family including follow up instructions, medication list, d/c activities limitations if indicated, with other d/c instructions as indicated by MD - patient able to verbalize understanding, all questions fully answered.   Patient instructed to return to ED, call 911, or call MD for any changes in condition.   Patient escorted via [place], and D/C home via private auto.  Christoper Fabian Grant Henkes 04/25/2017 3:46 PM

## 2017-04-25 NOTE — Discharge Instructions (Signed)
Savannah Jackson, Savannah Jackson were admitted for Hepatic Encephalopathy. This is when the ammonia and other toxins build up in your body and cause confusion. This was most likely due to your recent medication change. I have re-prescribed your lactulose but sent it to the [facility]. I usually have great response with this formulation. If you happen to notice that you feel "strange" again like your lactulose isnt working, please take an extra dose of Lactulose per day. You should take as many doses of lactulose a day so that you have 3-4 bowel movements. Please continue taking your Rifaximin as well.   You received dialysis while you were hospitalized. You are set to resume dialysis as usually scheduled, starting Friday 6/29.   I'm sorry you are not happy with your primary care provider. Please contact the [facility] here in [facility] [phone]) if you would like to establish care with [place]. You may call at any time.     Hepatic Encephalopathy Hepatic encephalopathy is a loss of brain function from advanced liver disease. The effects of the condition depend on the type of liver damage and how severe it is. In some cases, hepatic encephalopathy can be reversed. What are the causes? The exact cause of hepatic encephalopathy is not known. What increases the risk? You have a higher risk of getting this condition if your liver is damaged. When the liver is damaged harmful substances called toxins can build up in the body. Certain toxins, such as ammonia, can harm your brain. Conditions that can cause liver damage include:  An infection.  Dehydration.  Intestinal bleeding.  Drinking too much alcohol.  Taking certain medicines, including tranquilizers, water pills (diuretics), antidepressants, or sleeping pills.  What are the signs or symptoms? Signs and symptoms may develop suddenly. Or, they may develop slowly and get worse gradually. Symptoms can range from mild  to severe. Mild Hepatic Encephalopathy  Mild confusion.  Personality and mood changes.  Anxiety and agitation.  Drowsiness.  Loss of mental abilities.  Musty or sweet-smelling breath. Worsening or Severe Hepatic Encephalopathy  Slowed movement.  Slurred speech.  Extreme personality changes.  Disorientation.  Abnormal shaking or flapping of the hands.  Coma. How is this diagnosed? To make a diagnosis, your health care provider will do a physical exam. To rule out other causes of your signs and symptoms, he or she may order tests. You may have:  Blood tests. These may be done to check your ammonia level, measure how long it takes your blood to clot, and check for infection.  Liver function tests. These may be done to check how well your liver is working.  MRI and CT scans. These may be done to check for a brain disorder.  Electroencephalogram (EEG). This may be done to measure the electrical activity in your brain.  How is this treated? The first step in treatment is identifying and treating possible triggers. The next step is involves taking medicine to lower the level of toxins in the body and to prevent ammonia from building up. You may need to take:  Antibiotics to reduce the ammonia-producing bacteria in your gut.  Lactulose to help flush ammonia from the gut.  Follow these instructions at home: Eating and drinking  Follow a low-protein diet that includes plenty of fruits, vegetables, and whole grains, as directed by your health care provider. Ammonia is produced when you digest high-protein foods.  Work with a Microbiologist or with your health care  provider to make sure you are getting the right balance of protein and minerals.  Drink enough fluids to keep your urine clear or pale yellow. Drinking plenty of water helps prevent constipation.  Do not drink alcohol or use illegal drugs. Medicines  Only take medicine as directed by your health care provider.  If  you were prescribed an antibiotic medicine, finish it all even if you start to feel better.  Do not start any new medicines, including over-the-counter medicines, without first checking with your health care provider. Contact a health care provider if:  You have new symptoms.  Your symptoms change.  Your symptoms get worse.  You have a fever.  You are constipated.  You have persistent nausea, vomiting, or diarrhea. Get help right away if:  You become very confused or drowsy.  You vomit blood or material that looks like coffee grounds.  Your stool is bloody or black or looks like tar. This information is not intended to replace advice given to you by your health care provider. Make sure you discuss any questions you have with your health care provider. Document Released: 12/26/2006 Document Revised: 03/23/2016 Document Reviewed: 06/03/2014 Elsevier Interactive Patient Education  2018 [employer].

## 2017-04-25 NOTE — Progress Notes (Signed)
Medicine attending:  I examined this patient today and I concur with the evaluation and management plan as recorded by resident physician Dr. Romelle Starcher Molt. She continues to improve.  Mental status has now normalized.  Exam otherwise stable.  Dialysis treatment in progress.  We will need to try to obtain the lactulose product that was most effective in controlling her encephalopathy.  The only thing that changed and likely led to her deterioration was the use of a generic product. Anticipate discharge later today if we can make sure she gets the right medication.

## 2017-04-25 NOTE — Progress Notes (Signed)
   Subjective: Seen and evaluated today at bedside in HD. Her mental status has cleared and she is fully oriented. No complaints. Notes change in mental status after starting new brand of lactulose about 2 weeks ago.   Objective:  Vital signs in last 24 hours: Vitals:   04/25/17 0900 04/25/17 0930 04/25/17 1015 04/25/17 1030  BP: 99/71 109/68 99/60 106/65  Pulse: 63 68 61 62  Resp: 18 17 18 18   Temp:      TempSrc:      SpO2:      Weight:       General: AA F resting comfortably in bed on HD. Pleasant and conversant. Oriented x5.  HENT: PERRL. Oropharynx clear, mucous membranes moist.  Cardiovascular: Regular rate and rhythm. No murmur or rub appreciated. Pulmonary: CTA BL. Unlabored breathing.  Abdomen: Soft, non-tender and non-distended. +bowel sounds.  Extremities: No peripheral edema noted BL LE.  Psych: Mood normal and affect congruent. Responds to questions appropriately.   Assessment/Plan:  Active Problems:   Hepatic encephalopathy (HCC)   ESRD on dialysis [facility])  #Hepatic encephalopathy #Alcoholic Cirrhosis Secondary to medication adjustment. Mental status completely resolved and she is fully oriented and responding to questions appropriately. We will need to get the patient on either her prior formulation of lactulose or increase her home dose of the new version. Shes been afebrile and without abdominal pain or tenderness. She feels ready for discharge -PO Lactulose BID -Rifaximin -DC home  #ESRD on HD HD today. CMET with sodium of 130, was 133 on admission, K of 3.0 today before dialysis. Hypo k likely from diarrhea from lactulose. Will repeat BMET after HD and FU.  -HD per nephro recs, appreciate assistance  Dispo: Anticipated discharge today or tomorrow.   Franchesca Veneziano, DO 04/25/2017, 11:04 AM Pager: [phone]

## 2017-04-27 DIAGNOSIS — Z992 Dependence on renal dialysis: Secondary | ICD-10-CM | POA: Diagnosis not present

## 2017-04-27 DIAGNOSIS — N186 End stage renal disease: Secondary | ICD-10-CM | POA: Diagnosis not present

## 2017-04-28 DIAGNOSIS — Z992 Dependence on renal dialysis: Secondary | ICD-10-CM | POA: Diagnosis not present

## 2017-04-28 DIAGNOSIS — N186 End stage renal disease: Secondary | ICD-10-CM | POA: Diagnosis not present

## 2017-04-30 ENCOUNTER — Ambulatory Visit
Admission: RE | Admit: 2017-04-30 | Discharge: 2017-04-30 | Disposition: A | Discharge status: Home / Self Care | Payer: MEDICARE | Attending: Nephrology | Admitting: Nephrology

## 2017-04-30 DIAGNOSIS — N186 End stage renal disease: Secondary | ICD-10-CM | POA: Diagnosis not present

## 2017-04-30 DIAGNOSIS — Z7682 Awaiting organ transplant status: Secondary | ICD-10-CM | POA: Diagnosis not present

## 2017-04-30 DIAGNOSIS — Z01818 Encounter for other preprocedural examination: Secondary | ICD-10-CM | POA: Diagnosis not present

## 2017-04-30 DIAGNOSIS — Z23 Encounter for immunization: Secondary | ICD-10-CM | POA: Diagnosis not present

## 2017-04-30 DIAGNOSIS — Z992 Dependence on renal dialysis: Secondary | ICD-10-CM | POA: Diagnosis not present

## 2017-05-01 DIAGNOSIS — K7290 Hepatic failure, unspecified without coma: Secondary | ICD-10-CM | POA: Diagnosis not present

## 2017-05-02 DIAGNOSIS — N186 End stage renal disease: Secondary | ICD-10-CM | POA: Diagnosis not present

## 2017-05-02 DIAGNOSIS — Z992 Dependence on renal dialysis: Secondary | ICD-10-CM | POA: Diagnosis not present

## 2017-05-02 DIAGNOSIS — Z23 Encounter for immunization: Secondary | ICD-10-CM | POA: Diagnosis not present

## 2017-05-03 ENCOUNTER — Ambulatory Visit (HOSPITAL_COMMUNITY)
Admission: RE | Admit: 2017-05-03 | Discharge: 2017-05-03 | Disposition: A | Discharge status: Home / Self Care | Payer: Medicare HMO | Source: Ambulatory Visit | Attending: Vascular Surgery | Admitting: Vascular Surgery

## 2017-05-03 ENCOUNTER — Ambulatory Visit (INDEPENDENT_AMBULATORY_CARE_PROVIDER_SITE_OTHER): Payer: Medicare HMO | Admitting: Vascular Surgery

## 2017-05-03 ENCOUNTER — Ambulatory Visit (INDEPENDENT_AMBULATORY_CARE_PROVIDER_SITE_OTHER)
Admission: RE | Admit: 2017-05-03 | Discharge: 2017-05-03 | Disposition: A | Discharge status: Home / Self Care | Payer: Medicare HMO | Source: Ambulatory Visit | Attending: Vascular Surgery | Admitting: Vascular Surgery

## 2017-05-03 ENCOUNTER — Encounter: Payer: Self-pay | Admitting: Vascular Surgery

## 2017-05-03 VITALS — BP 103/73 | HR 64 | Temp 97.4°F | Resp 16 | Ht 63.0 in | Wt 158.0 lb

## 2017-05-03 DIAGNOSIS — Z48812 Encounter for surgical aftercare following surgery on the circulatory system: Secondary | ICD-10-CM

## 2017-05-03 DIAGNOSIS — N186 End stage renal disease: Secondary | ICD-10-CM | POA: Diagnosis not present

## 2017-05-03 DIAGNOSIS — Z01810 Encounter for preprocedural cardiovascular examination: Secondary | ICD-10-CM

## 2017-05-03 DIAGNOSIS — Z992 Dependence on renal dialysis: Secondary | ICD-10-CM | POA: Diagnosis not present

## 2017-05-03 DIAGNOSIS — Z01818 Encounter for other preprocedural examination: Secondary | ICD-10-CM

## 2017-05-03 DIAGNOSIS — Z7682 Awaiting organ transplant status: Principal | ICD-10-CM

## 2017-05-03 NOTE — Progress Notes (Signed)
Referring Physician: Hinda Lenis  Patient name: Savannah Jackson MRN: [id number] DOB: 1956/09/28 Sex: female  REASON FOR CONSULT: hemodialysis access  HPI: [name] is a 61 y.o. female,  who recently started hemodialysis in January of this year. She currently dialyzes via a right-sided catheter. The cause of her renal failure is thought to be secondary to alcohol abuse. 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 She has also recently had a hospital admission for encephalopathy a few weeks ago. She has now improved. She currently dialyzes Monday Wednesday and Friday. She was started on midodrine recently because of hypotension. She states her blood pressure has improved into the 1 teens to 120s since then. She is right-handed. She did have some type of axillary node dissection for breast cancer on the right side in the past. However, the sounds of sentinel node biopsy rather than a full axillary dissection. She does not really have any chronic swelling in her right arm. Other medical problems include chronic lower extremity swelling and cirrhosis as mentioned above. This is currently stable.  Past Medical History:  Diagnosis Date  . Bilateral leg edema   . Brain aneurysm   . Breast disorder    right breast cancer 2002  . Cancer [facility])    breast cancer  . Chronic back pain Diverticultis  . Chronic kidney disease (CKD) stage G3b/A3, moderately decreased glomerular filtration rate (GFR) between 30-44 mL/min/1.73 square meter and albuminuria creatinine ratio greater than 300 mg/g 08/30/2016  . Cirrhosis ([place])    alcoholic  . DDD (degenerative disc disease), lumbar   . Diverticulosis    scope 2014  . ETOH abuse   . Hepatic encephalopathy ([place]) 04/23/2017  . Hepatomegaly    scope 2014  . History of breast cancer 06/07/2016  . Hypertension   . 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 Rotator cuff syndrome of left shoulder   . Sleep apnea   . Thickened endometrium 06/19/2016   Will get endo biopsy    Past Surgical History:  Procedure Laterality Date  . BRAIN SURGERY     aneurysm at [place]  . CATARACT EXTRACTION Bilateral    [facility] 2 or 3 years ago  . CHOLECYSTECTOMY    . COLONOSCOPY N/A 04/10/2013   Procedure: COLONOSCOPY;  Surgeon: Rogene Houston, MD;  [facility];  Service: Endoscopy;  Laterality: N/A;  1030-rescheduled to 1200 Ann notified pt  . ESOPHAGOGASTRODUODENOSCOPY N/A 04/22/2015   Procedure: ESOPHAGOGASTRODUODENOSCOPY (EGD);  Surgeon: Rogene Houston, MD;  [facility];  Service: Endoscopy;  Laterality: N/A;  . IR GENERIC HISTORICAL  07/04/2016   IR RADIOLOGIST EVAL & MGMT 07/04/2016 Jacqulynn Cadet, MD [facility] INTERV RAD  . RADIOLOGY WITH ANESTHESIA N/A 07/16/2015   Procedure: TIPS;  Surgeon: Medication Radiologist, MD;  Location: [place];  Service: Radiology;  Laterality: N/A;  . TOTAL KNEE ARTHROPLASTY     right. 2002    Family History  Problem Relation Age of Onset  . Aneurysm Mother   . Other Daughter        knee replacement  . Hypertension Daughter     SOCIAL HISTORY: Social History   Social History  . Marital status: Married    Spouse name: N/A  . Number of children: N/A  . Years of education: N/A   Occupational History  .  disabled    Social History Main Topics  . Smoking status: Never Smoker  . Smokeless tobacco: Never Used  . Alcohol use No     Comment: none since 03/2015  . Drug use: No  . Sexual activity: Not Currently    Birth control/ protection: Post-menopausal   Other Topics Concern  . 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 However he is fairly capable of taking care of himself at home. This couple have an attentive daughter who keeps an eye on both  of them.    Allergies  Allergen Reactions  . Latex Swelling    Discoloration of skin.   . Vicodin [Hydrocodone-Acetaminophen] Itching    Current Outpatient Prescriptions  Medication Sig Dispense Refill  . lactulose (CHRONULAC) 10 GM/15ML solution Take 45 mLs (30 g total) by mouth 2 (two) times daily. Goal of 3 bowel movements per day 240 mL 0  . midodrine (PROAMATINE) 5 MG tablet Take 5 mg by mouth daily.    . rifaximin (XIFAXAN) 550 MG TABS tablet Take 550 mg by mouth 2 (two) times daily.     No current facility-administered medications for this visit.     ROS:   General:  No weight loss, Fever, chills  HEENT: No recent headaches, no nasal bleeding, no visual changes, no sore throat  Neurologic: No dizziness, blackouts, seizures. No recent symptoms of stroke or mini- stroke. No recent episodes of slurred speech, or temporary blindness.  Cardiac: No recent episodes of chest pain/pressure, no shortness of breath at rest.  No shortness of breath with exertion.  Denies history of atrial fibrillation or irregular heartbeat  Vascular: No history of rest pain in feet.  No history of claudication.  No history of non-healing ulcer, No history of DVT   Pulmonary: No home oxygen, no productive cough, no hemoptysis,  No asthma or wheezing  Musculoskeletal:  [ ]  Arthritis, [ ]  Low back pain,  [ ]  Joint pain  Hematologic:No history of hypercoagulable state.  No history of easy bleeding.  No history of anemia  Gastrointestinal: No hematochezia or melena,  No gastroesophageal reflux, no trouble swallowing  Urinary: [X]  chronic Kidney disease, [X]  on HD - [X]  MWF or [ ]  TTHS, [ ]  Burning with urination, [ ]  Frequent urination, [ ]  Difficulty urinating;   Skin: No rashes  Psychological: No history of anxiety,  No history of depression   Physical Examination  Vitals:   05/03/17 1103  BP: 103/73  Pulse: 64  Resp: 16  Temp: (!) 97.4 F (36.3 C)  TempSrc: Oral  SpO2: 100%  Weight:  158 lb (71.7 kg)  Height: 5\' 3"  (1.6 m)    Body mass index is 27.99 kg/m.  General:  Alert and oriented, no acute distress HEENT: Normal Neck: No bruit or JVD Pulmonary: Clear to auscultation bilaterally Cardiac: Regular Rate and Rhythm without murmur Abdomen: Soft, non-tender Skin: No rash Extremity Pulses:  2+ radial, brachial pulses bilaterally Musculoskeletal: No deformity or edema  Neurologic: Upper and lower extremity motor 5/5 and symmetric  DATA:  Patient had a externally arterial duplex and vein mapping ultrasound today. The arterial diameter was 3 mm at the antecubital level bilaterally. Radial artery was just over 2 mm at the wrist bilaterally. 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 It is small on the left side less than 2 mm. Basilic vein was to a half to  3 mm in the upper arm bilaterally.  ASSESSMENT:  Patient needs long-term hemodialysis access. She does have a reasonable vein in the upper arm for consideration of basilic vein fistula on both sides. However the left vein is of better quality. 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 Although the vein is in the usable category it may be quite small and may not mature for an AV fistula. Had lengthy discussion with the patient her husband and her daughter regarding advantages and disadvantages of graft versus fistula and informed them that the fistula may not get to a size that we would find usable for access. In that case she may have to have a graft down the road. I believe it is reasonable to try a first stage basilic vein transposition fistula. If this does mature we would proceed with second stage. Otherwise we will place an AV graft.   PLAN:  First stage basilic fistula 15/83/0940. Risks benefits possible palpitations and procedure details including but not limited to bleeding infection  non-maturation of the fistula ischemic steal all were discussed patient and her family. She does have some underlying numbness and tingling in both hands and I discussed with her that this may get worse with a fistula creation. She understands and agrees to proceed.   Ruta Hinds, MD [facility] Office: [phone] Pager: [phone]

## 2017-05-04 DIAGNOSIS — N186 End stage renal disease: Secondary | ICD-10-CM | POA: Diagnosis not present

## 2017-05-04 DIAGNOSIS — Z23 Encounter for immunization: Secondary | ICD-10-CM | POA: Diagnosis not present

## 2017-05-04 DIAGNOSIS — Z992 Dependence on renal dialysis: Secondary | ICD-10-CM | POA: Diagnosis not present

## 2017-05-07 DIAGNOSIS — Z992 Dependence on renal dialysis: Secondary | ICD-10-CM | POA: Diagnosis not present

## 2017-05-07 DIAGNOSIS — Z23 Encounter for immunization: Secondary | ICD-10-CM | POA: Diagnosis not present

## 2017-05-07 DIAGNOSIS — N186 End stage renal disease: Secondary | ICD-10-CM | POA: Diagnosis not present

## 2017-05-08 DIAGNOSIS — K7290 Hepatic failure, unspecified without coma: Secondary | ICD-10-CM | POA: Diagnosis not present

## 2017-05-09 DIAGNOSIS — Z992 Dependence on renal dialysis: Secondary | ICD-10-CM | POA: Diagnosis not present

## 2017-05-09 DIAGNOSIS — N186 End stage renal disease: Secondary | ICD-10-CM | POA: Diagnosis not present

## 2017-05-09 DIAGNOSIS — Z23 Encounter for immunization: Secondary | ICD-10-CM | POA: Diagnosis not present

## 2017-05-11 DIAGNOSIS — Z992 Dependence on renal dialysis: Secondary | ICD-10-CM | POA: Diagnosis not present

## 2017-05-11 DIAGNOSIS — N186 End stage renal disease: Secondary | ICD-10-CM | POA: Diagnosis not present

## 2017-05-11 DIAGNOSIS — Z23 Encounter for immunization: Secondary | ICD-10-CM | POA: Diagnosis not present

## 2017-05-14 ENCOUNTER — Other Ambulatory Visit: Payer: Self-pay

## 2017-05-14 DIAGNOSIS — Z992 Dependence on renal dialysis: Secondary | ICD-10-CM | POA: Diagnosis not present

## 2017-05-14 DIAGNOSIS — Z23 Encounter for immunization: Secondary | ICD-10-CM | POA: Diagnosis not present

## 2017-05-14 DIAGNOSIS — N186 End stage renal disease: Secondary | ICD-10-CM | POA: Diagnosis not present

## 2017-05-15 DIAGNOSIS — H2513 Age-related nuclear cataract, bilateral: Secondary | ICD-10-CM | POA: Diagnosis not present

## 2017-05-15 DIAGNOSIS — K7290 Hepatic failure, unspecified without coma: Secondary | ICD-10-CM | POA: Diagnosis not present

## 2017-05-15 DIAGNOSIS — H40033 Anatomical narrow angle, bilateral: Secondary | ICD-10-CM | POA: Diagnosis not present

## 2017-05-16 DIAGNOSIS — Z992 Dependence on renal dialysis: Secondary | ICD-10-CM | POA: Diagnosis not present

## 2017-05-16 DIAGNOSIS — N186 End stage renal disease: Secondary | ICD-10-CM | POA: Diagnosis not present

## 2017-05-16 DIAGNOSIS — Z23 Encounter for immunization: Secondary | ICD-10-CM | POA: Diagnosis not present

## 2017-05-17 ENCOUNTER — Encounter (HOSPITAL_COMMUNITY): Payer: Self-pay | Admitting: *Deleted

## 2017-05-17 MED ORDER — HEPARIN SODIUM (PORCINE) 1000 UNIT/ML DIALYSIS: 1000.0000 [IU] | INTRAMUSCULAR | Status: DC | PRN

## 2017-05-17 MED ORDER — ALTEPLASE 2 MG IJ SOLR
2.0000 mg | Freq: Once | INTRAMUSCULAR | Status: DC | PRN
Start: 2017-05-17 — End: 2017-05-17

## 2017-05-17 MED ORDER — SODIUM CHLORIDE 0.9 % IV SOLN: 100.0000 mL | INTRAVENOUS | Status: DC | PRN

## 2017-05-17 MED ORDER — LIDOCAINE HCL (PF) 1 % IJ SOLN: 5.0000 mL | INTRAMUSCULAR | Status: DC | PRN

## 2017-05-17 MED ORDER — LIDOCAINE-PRILOCAINE 2.5-2.5 % EX CREA: 1.0000 "application " | TOPICAL_CREAM | CUTANEOUS | Status: DC | PRN

## 2017-05-17 MED ORDER — PENTAFLUOROPROP-TETRAFLUOROETH EX AERO: 1.0000 "application " | INHALATION_SPRAY | CUTANEOUS | Status: DC | PRN

## 2017-05-17 NOTE — Progress Notes (Signed)
Mrs Little has liver Failure and has been worked up for transplant at [facility].  I asked Willeen Cass, NP to review chart.

## 2017-05-18 DIAGNOSIS — Z992 Dependence on renal dialysis: Secondary | ICD-10-CM | POA: Diagnosis not present

## 2017-05-18 DIAGNOSIS — Z23 Encounter for immunization: Secondary | ICD-10-CM | POA: Diagnosis not present

## 2017-05-18 DIAGNOSIS — N186 End stage renal disease: Secondary | ICD-10-CM | POA: Diagnosis not present

## 2017-05-18 NOTE — Progress Notes (Signed)
Anesthesia Chart Review:  Pt is a same day work up.   Pt is a 61 year old female scheduled for L upper arm AV fistula creation on 05/22/2017 with Ruta Hinds, MD  - PCP is Rosita Fire, MD  PMH includes:  HTN, alcoholic cirrhosis, end-stage liver disease (evaluated at [facility] for liver and kidney transplant - see care everywhere), brain aneurysm (s/p clipping 1997), ESRD on hemodialysis, OSA, breast cancer.  Prior alcohol abuse (reportedly quit 03/2015). Never smoker. S/p TIPS 07/16/15  - Hospitalized 6/25-27/18 for hepatic encephalopathy.  - Hospitalized 10/29/16-10/31/16 for acute respiratory failure with hypoxia due to volume overload from cirrhosis and nephrotic syndrome (s/p thoracentesis of 2.2L), anasarca  Medications include: Lactulose, midodrine, rifaximin  Labs will be obtained DOS. I have ordered CMET and CBC due to liver disease.  - PT 13.3 on 05/15/17 (care everywhere)  1 view CXR 04/23/17:  1. Dual-lumen catheter with tips in the right atrium. No pneumothorax. 2. Cardiomegaly with mild pulmonary venous congestion.  3. Mild atelectasis and infiltrate left lung base. Small bilateral pleural effusions.  EKG 04/24/17: Sinus rhythm with occasional PVCs. LVH with repolarization abnormality. Prolonged QT (QTc 669). Done in the setting of hepatic encephalopathy.  Will recheck EKG DOS.   Echo 11/06/16 (care everywhere):  Normal left ventricular systolic function, ejection fraction 60 to 29%  Diastolic dysfunction - grade II (elevated filling pressures)  Dilated left atrium - mild  Dilated ascending aorta  Normal right ventricular systolic function  Tricuspid regurgitation - mild  Dobutamine stress echo 06/19/16 (care everywhere):  - Negative dobutamine stress echocardiogram - there is no electrocardiographic or echocardiographic evidence of inducible myocardial ischemia.  If labs and EKG acceptable DOS, I anticipate pt can proceed as scheduled.   Willeen Cass, FNP-BC [facility]/Anesthesiology Phone: [phone] 05/18/2017 12:07 PM

## 2017-05-21 DIAGNOSIS — Z992 Dependence on renal dialysis: Secondary | ICD-10-CM | POA: Diagnosis not present

## 2017-05-21 DIAGNOSIS — Z23 Encounter for immunization: Secondary | ICD-10-CM | POA: Diagnosis not present

## 2017-05-21 DIAGNOSIS — N186 End stage renal disease: Secondary | ICD-10-CM | POA: Diagnosis not present

## 2017-05-21 MED ORDER — DEXTROSE 5 % IV SOLN
1.5000 g | INTRAVENOUS | Status: AC
  Administered 2017-05-22: 1.5 g via INTRAVENOUS
  Filled 2017-05-21: qty 1.5

## 2017-05-22 ENCOUNTER — Ambulatory Visit (HOSPITAL_COMMUNITY): Payer: Medicare HMO | Admitting: Emergency Medicine

## 2017-05-22 ENCOUNTER — Encounter (HOSPITAL_COMMUNITY)
Admission: RE | Disposition: A | Discharge status: Home / Self Care | Payer: Self-pay | Source: Ambulatory Visit | Attending: Vascular Surgery

## 2017-05-22 ENCOUNTER — Ambulatory Visit (HOSPITAL_COMMUNITY)
Admission: RE | Admit: 2017-05-22 | Discharge: 2017-05-22 | Disposition: A | Discharge status: Home / Self Care | Payer: Medicare HMO | Source: Ambulatory Visit | Attending: Vascular Surgery | Admitting: Vascular Surgery

## 2017-05-22 ENCOUNTER — Encounter (HOSPITAL_COMMUNITY): Payer: Self-pay | Admitting: Urology

## 2017-05-22 DIAGNOSIS — Z885 Allergy status to narcotic agent status: Secondary | ICD-10-CM | POA: Insufficient documentation

## 2017-05-22 DIAGNOSIS — M5136 Other intervertebral disc degeneration, lumbar region: Secondary | ICD-10-CM | POA: Diagnosis not present

## 2017-05-22 DIAGNOSIS — K7460 Unspecified cirrhosis of liver: Secondary | ICD-10-CM | POA: Insufficient documentation

## 2017-05-22 DIAGNOSIS — N186 End stage renal disease: Secondary | ICD-10-CM | POA: Diagnosis not present

## 2017-05-22 DIAGNOSIS — N185 Chronic kidney disease, stage 5: Secondary | ICD-10-CM | POA: Diagnosis not present

## 2017-05-22 DIAGNOSIS — G4730 Sleep apnea, unspecified: Secondary | ICD-10-CM | POA: Diagnosis not present

## 2017-05-22 DIAGNOSIS — I120 Hypertensive chronic kidney disease with stage 5 chronic kidney disease or end stage renal disease: Secondary | ICD-10-CM | POA: Insufficient documentation

## 2017-05-22 DIAGNOSIS — Z853 Personal history of malignant neoplasm of breast: Secondary | ICD-10-CM | POA: Insufficient documentation

## 2017-05-22 DIAGNOSIS — J9601 Acute respiratory failure with hypoxia: Secondary | ICD-10-CM | POA: Diagnosis not present

## 2017-05-22 DIAGNOSIS — R1032 Left lower quadrant pain: Secondary | ICD-10-CM | POA: Diagnosis not present

## 2017-05-22 DIAGNOSIS — G8929 Other chronic pain: Secondary | ICD-10-CM | POA: Diagnosis not present

## 2017-05-22 DIAGNOSIS — Z992 Dependence on renal dialysis: Secondary | ICD-10-CM | POA: Diagnosis not present

## 2017-05-22 DIAGNOSIS — Z79899 Other long term (current) drug therapy: Secondary | ICD-10-CM | POA: Diagnosis not present

## 2017-05-22 HISTORY — DX: Pneumonia, unspecified organism: J18.9

## 2017-05-22 HISTORY — PX: AV FISTULA PLACEMENT: SHX1204

## 2017-05-22 LAB — CBC
HCT: 36.5 % (ref 36.0–46.0)
Hemoglobin: 11.8 g/dL — ABNORMAL LOW (ref 12.0–15.0)
MCH: 27.6 pg (ref 26.0–34.0)
MCHC: 32.3 g/dL (ref 30.0–36.0)
MCV: 85.3 fL (ref 78.0–100.0)
PLATELETS: 116 10*3/uL — AB (ref 150–400)
RBC: 4.28 MIL/uL (ref 3.87–5.11)
RDW: 15.8 % — ABNORMAL HIGH (ref 11.5–15.5)
WBC: 5 10*3/uL (ref 4.0–10.5)

## 2017-05-22 LAB — COMPREHENSIVE METABOLIC PANEL
ALBUMIN: 2.7 g/dL — AB (ref 3.5–5.0)
ALT: 18 U/L (ref 14–54)
AST: 47 U/L — AB (ref 15–41)
Alkaline Phosphatase: 257 U/L — ABNORMAL HIGH (ref 38–126)
Anion gap: 12 (ref 5–15)
BUN: 19 mg/dL (ref 6–20)
CHLORIDE: 98 mmol/L — AB (ref 101–111)
CO2: 23 mmol/L (ref 22–32)
Calcium: 9.1 mg/dL (ref 8.9–10.3)
Creatinine, Ser: 4.33 mg/dL — ABNORMAL HIGH (ref 0.44–1.00)
GFR calc Af Amer: 12 mL/min — ABNORMAL LOW (ref >60)
GFR calc non Af Amer: 10 mL/min — ABNORMAL LOW (ref >60)
GLUCOSE: 84 mg/dL (ref 65–99)
POTASSIUM: 3.1 mmol/L — AB (ref 3.5–5.1)
Sodium: 133 mmol/L — ABNORMAL LOW (ref 135–145)
Total Bilirubin: 2 mg/dL — ABNORMAL HIGH (ref 0.3–1.2)
Total Protein: 5.5 g/dL — ABNORMAL LOW (ref 6.5–8.1)

## 2017-05-22 SURGERY — ARTERIOVENOUS (AV) FISTULA CREATION
Anesthesia: Monitor Anesthesia Care | Site: Arm Upper | Laterality: Left

## 2017-05-22 MED ORDER — 0.9 % SODIUM CHLORIDE (POUR BTL) OPTIME
TOPICAL | Status: DC | PRN
  Administered 2017-05-22: 1000 mL

## 2017-05-22 MED ORDER — EPHEDRINE 5 MG/ML INJ
INTRAVENOUS | Status: AC
  Filled 2017-05-22: qty 10

## 2017-05-22 MED ORDER — ONDANSETRON HCL 4 MG/2ML IJ SOLN
INTRAMUSCULAR | Status: AC
  Filled 2017-05-22: qty 2

## 2017-05-22 MED ORDER — FENTANYL CITRATE (PF) 250 MCG/5ML IJ SOLN
INTRAMUSCULAR | Status: AC
  Filled 2017-05-22: qty 5

## 2017-05-22 MED ORDER — ONDANSETRON HCL 4 MG/2ML IJ SOLN
INTRAMUSCULAR | Status: DC | PRN
  Administered 2017-05-22: 4 mg via INTRAVENOUS

## 2017-05-22 MED ORDER — PROTAMINE SULFATE 10 MG/ML IV SOLN
INTRAVENOUS | Status: DC | PRN
  Administered 2017-05-22: 10 mg via INTRAVENOUS
  Administered 2017-05-22: 5 mg via INTRAVENOUS
  Administered 2017-05-22 (×2): 10 mg via INTRAVENOUS
  Administered 2017-05-22: 15 mg via INTRAVENOUS

## 2017-05-22 MED ORDER — HEPARIN SODIUM (PORCINE) 1000 UNIT/ML IJ SOLN
INTRAMUSCULAR | Status: AC
  Filled 2017-05-22: qty 1

## 2017-05-22 MED ORDER — DEXAMETHASONE SODIUM PHOSPHATE 10 MG/ML IJ SOLN
INTRAMUSCULAR | Status: AC
  Filled 2017-05-22: qty 1

## 2017-05-22 MED ORDER — DEXAMETHASONE SODIUM PHOSPHATE 10 MG/ML IJ SOLN
INTRAMUSCULAR | Status: DC | PRN
  Administered 2017-05-22: 4 mg via INTRAVENOUS

## 2017-05-22 MED ORDER — PROPOFOL 10 MG/ML IV BOLUS
INTRAVENOUS | Status: DC | PRN
  Administered 2017-05-22 (×5): 20 mg via INTRAVENOUS

## 2017-05-22 MED ORDER — PHENYLEPHRINE 40 MCG/ML (10ML) SYRINGE FOR IV PUSH (FOR BLOOD PRESSURE SUPPORT)
PREFILLED_SYRINGE | INTRAVENOUS | Status: AC
  Filled 2017-05-22: qty 10

## 2017-05-22 MED ORDER — SODIUM CHLORIDE 0.9 % IV SOLN
INTRAVENOUS | Status: DC
  Administered 2017-05-22: 07:00:00 via INTRAVENOUS

## 2017-05-22 MED ORDER — PROPOFOL 500 MG/50ML IV EMUL
INTRAVENOUS | Status: DC | PRN
  Administered 2017-05-22: 50 ug/kg/min via INTRAVENOUS

## 2017-05-22 MED ORDER — HEPARIN SODIUM (PORCINE) 1000 UNIT/ML IJ SOLN
INTRAMUSCULAR | Status: DC | PRN
  Administered 2017-05-22: 5000 [IU] via INTRAVENOUS

## 2017-05-22 MED ORDER — HEPARIN SODIUM (PORCINE) 5000 UNIT/ML IJ SOLN
INTRAMUSCULAR | Status: DC | PRN
  Administered 2017-05-22: 500 mL

## 2017-05-22 MED ORDER — MIDAZOLAM HCL 2 MG/2ML IJ SOLN
INTRAMUSCULAR | Status: AC
  Filled 2017-05-22: qty 2

## 2017-05-22 MED ORDER — MIDAZOLAM HCL 5 MG/5ML IJ SOLN
INTRAMUSCULAR | Status: DC | PRN
  Administered 2017-05-22: 1 mg via INTRAVENOUS

## 2017-05-22 MED ORDER — LIDOCAINE 2% (20 MG/ML) 5 ML SYRINGE
INTRAMUSCULAR | Status: DC | PRN
  Administered 2017-05-22: 40 mg via INTRAVENOUS

## 2017-05-22 MED ORDER — SODIUM CHLORIDE 0.9 % IV SOLN
INTRAVENOUS | Status: DC | PRN
  Administered 2017-05-22: 07:00:00 via INTRAVENOUS

## 2017-05-22 MED ORDER — CHLORHEXIDINE GLUCONATE CLOTH 2 % EX PADS: 6.0000 | MEDICATED_PAD | Freq: Once | CUTANEOUS | Status: DC

## 2017-05-22 MED ORDER — SUCCINYLCHOLINE CHLORIDE 200 MG/10ML IV SOSY
PREFILLED_SYRINGE | INTRAVENOUS | Status: AC
  Filled 2017-05-22: qty 10

## 2017-05-22 MED ORDER — PHENYLEPHRINE 40 MCG/ML (10ML) SYRINGE FOR IV PUSH (FOR BLOOD PRESSURE SUPPORT)
PREFILLED_SYRINGE | INTRAVENOUS | Status: DC | PRN
  Administered 2017-05-22 (×2): 80 ug via INTRAVENOUS

## 2017-05-22 MED ORDER — LIDOCAINE HCL (PF) 1 % IJ SOLN
INTRAMUSCULAR | Status: DC | PRN
  Administered 2017-05-22: 12 mL via SUBCUTANEOUS

## 2017-05-22 MED ORDER — OXYCODONE-ACETAMINOPHEN 5-325 MG PO TABS: 1.0000 | ORAL_TABLET | Freq: Four times a day (QID) | ORAL | 0 refills | Status: DC | PRN

## 2017-05-22 MED ORDER — PROTAMINE SULFATE 10 MG/ML IV SOLN
INTRAVENOUS | Status: AC
  Filled 2017-05-22: qty 5

## 2017-05-22 MED ORDER — FENTANYL CITRATE (PF) 100 MCG/2ML IJ SOLN
INTRAMUSCULAR | Status: DC | PRN
  Administered 2017-05-22: 50 ug via INTRAVENOUS
  Administered 2017-05-22 (×2): 25 ug via INTRAVENOUS
  Administered 2017-05-22 (×2): 50 ug via INTRAVENOUS

## 2017-05-22 MED ORDER — LIDOCAINE HCL (PF) 1 % IJ SOLN
INTRAMUSCULAR | Status: AC
Start: 2017-05-22 — End: 2017-05-22
  Filled 2017-05-22: qty 30

## 2017-05-22 MED ORDER — SODIUM CHLORIDE 0.9 % IV SOLN: INTRAVENOUS | Status: DC

## 2017-05-22 MED ORDER — LIDOCAINE 2% (20 MG/ML) 5 ML SYRINGE
INTRAMUSCULAR | Status: AC
  Filled 2017-05-22: qty 5

## 2017-05-22 SURGICAL SUPPLY — 30 items
ADH SKN CLS APL DERMABOND .7 (GAUZE/BANDAGES/DRESSINGS) ×1
ARMBAND PINK RESTRICT EXTREMIT (MISCELLANEOUS) ×3 IMPLANT
CANISTER SUCT 3000ML PPV (MISCELLANEOUS) ×2 IMPLANT
CANNULA VESSEL 3MM 2 BLNT TIP (CANNULA) ×3 IMPLANT
CLIP VESOCCLUDE MED 6/CT (CLIP) ×3 IMPLANT
CLIP VESOCCLUDE SM WIDE 6/CT (CLIP) ×3 IMPLANT
COVER PROBE W GEL 5X96 (DRAPES) ×1 IMPLANT
DECANTER SPIKE VIAL GLASS SM (MISCELLANEOUS) ×2 IMPLANT
DERMABOND ADVANCED (GAUZE/BANDAGES/DRESSINGS) ×1
DERMABOND ADVANCED .7 DNX12 (GAUZE/BANDAGES/DRESSINGS) ×1 IMPLANT
DRAIN PENROSE 1/2X12 LTX STRL (WOUND CARE) ×1 IMPLANT
DRAIN PENROSE 1/4X12 LTX STRL (WOUND CARE) ×2 IMPLANT
ELECT REM PT RETURN 9FT ADLT (ELECTROSURGICAL) ×2
ELECTRODE REM PT RTRN 9FT ADLT (ELECTROSURGICAL) ×1 IMPLANT
GLOVE BIO SURGEON STRL SZ7.5 (GLOVE) ×2 IMPLANT
GOWN STRL REUS W/ TWL LRG LVL3 (GOWN DISPOSABLE) ×3 IMPLANT
GOWN STRL REUS W/TWL LRG LVL3 (GOWN DISPOSABLE) ×8
KIT BASIN OR (CUSTOM PROCEDURE TRAY) ×2 IMPLANT
KIT ROOM TURNOVER OR (KITS) ×2 IMPLANT
LOOP VESSEL MINI RED (MISCELLANEOUS) IMPLANT
NS IRRIG 1000ML POUR BTL (IV SOLUTION) ×2 IMPLANT
PACK CV ACCESS (CUSTOM PROCEDURE TRAY) ×2 IMPLANT
PAD ARMBOARD 7.5X6 YLW CONV (MISCELLANEOUS) ×4 IMPLANT
SPONGE SURGIFOAM ABS GEL 100 (HEMOSTASIS) IMPLANT
SUT PROLENE 7 0 BV 1 (SUTURE) ×6 IMPLANT
SUT VIC AB 3-0 SH 27 (SUTURE) ×6
SUT VIC AB 3-0 SH 27X BRD (SUTURE) ×1 IMPLANT
SUT VICRYL 4-0 PS2 18IN ABS (SUTURE) ×4 IMPLANT
UNDERPAD 30X30 (UNDERPADS AND DIAPERS) ×2 IMPLANT
WATER STERILE IRR 1000ML POUR (IV SOLUTION) ×2 IMPLANT

## 2017-05-22 NOTE — Anesthesia Preprocedure Evaluation (Signed)
Anesthesia Evaluation  Patient identified by MRN, date of birth, ID band Patient awake    Reviewed: Allergy & Precautions, NPO status , Patient's Chart, lab work & pertinent test results  History of Anesthesia Complications (+) PSEUDOCHOLINESTERASE DEFICIENCY  Airway Mallampati: II  TM Distance: >3 FB     Dental   Pulmonary sleep apnea , pneumonia,    breath sounds clear to auscultation       Cardiovascular hypertension, + Peripheral Vascular Disease   Rhythm:Regular Rate:Normal     Neuro/Psych    GI/Hepatic negative GI ROS, Neg liver ROS,   Endo/Other    Renal/GU Renal disease     Musculoskeletal  (+) Arthritis ,   Abdominal   Peds  Hematology  (+) anemia ,   Anesthesia Other Findings   Reproductive/Obstetrics                             Anesthesia Physical Anesthesia Plan  ASA: III  Anesthesia Plan: MAC   Post-op Pain Management:    Induction: Intravenous  PONV Risk Score and Plan: 2 and Ondansetron, Dexamethasone and Propofol  Airway Management Planned: Simple Face Mask  Additional Equipment:   Intra-op Plan:   Post-operative Plan:   Informed Consent: I have reviewed the patients History and Physical, chart, labs and discussed the procedure including the risks, benefits and alternatives for the proposed anesthesia with the patient or authorized representative who has indicated his/her understanding and acceptance.   Dental advisory given  Plan Discussed with: CRNA and Anesthesiologist  Anesthesia Plan Comments:         Anesthesia Quick Evaluation

## 2017-05-22 NOTE — Transfer of Care (Signed)
Immediate Anesthesia Transfer of Care Note  Patient: Savannah Jackson  Procedure(s) Performed: Procedure(s): BRACHIAL ARTERY TO [place] (Left)  Patient Location: PACU  Anesthesia Type:MAC  Level of Consciousness: awake, alert , oriented and patient cooperative  Airway & Oxygen Therapy: Patient Spontanous Breathing and Patient connected to nasal cannula oxygen  Post-op Assessment: Report given to RN and Post -op Vital signs reviewed and stable  Post vital signs: Reviewed and stable  Last Vitals:  Vitals:   05/22/17 0639 05/22/17 1145  BP:  (!) (P) 96/58  Pulse: 60 (P) 68  Resp:    Temp:  (P) 36.5 C    Last Pain:  Vitals:   05/22/17 0622  TempSrc: Oral      Patients Stated Pain Goal: 4 (50/53/97 6734)  Complications: No apparent anesthesia complications

## 2017-05-22 NOTE — Anesthesia Postprocedure Evaluation (Signed)
Anesthesia Post Note  Patient: Savannah Jackson  Procedure(s) Performed: Procedure(s) (LRB): BRACHIAL ARTERY TO [name] (Left)     Patient location during evaluation: PACU Anesthesia Type: MAC Level of consciousness: awake Pain management: pain level controlled Vital Signs Assessment: post-procedure vital signs reviewed and stable Respiratory status: spontaneous breathing Cardiovascular status: stable Anesthetic complications: no    Last Vitals:  Vitals:   05/22/17 1220 05/22/17 1230  BP: 100/63 98/67  Pulse: 65 63  Resp: 13   Temp: 36.5 C     Last Pain:  Vitals:   05/22/17 1230  TempSrc:   PainSc: 0-No pain                 Janyah Singleterry

## 2017-05-22 NOTE — Progress Notes (Signed)
Report given to jada rn as caregiver 

## 2017-05-22 NOTE — Discharge Instructions (Signed)
[facility] of [facility]  Discharge Instructions  AV Fistula or Graft Surgery for Dialysis Access  Please refer to the following instructions for your post-procedure care. Your surgeon or physician assistant will discuss any changes with you.  Activity  You may drive the day following your surgery, if you are comfortable and no longer taking prescription pain medication. Resume full activity as the soreness in your incision resolves.  Bathing/Showering  You may shower after you go home. Keep your incision dry for 48 hours. Do not soak in a bathtub, hot tub, or swim until the incision heals completely. You may not shower if you have a hemodialysis catheter.  Incision Care  Clean your incision with mild soap and water after 48 hours. Pat the area dry with a clean towel. You do not need a bandage unless otherwise instructed. Do not apply any ointments or creams to your incision. You may have skin glue on your incision. Do not peel it off. It will come off on its own in about one week. Your arm may swell a bit after surgery. To reduce swelling use pillows to elevate your arm so it is above your heart. Your doctor will tell you if you need to lightly wrap your arm with an ACE bandage.  Diet  Resume your normal diet. There are not special food restrictions following this procedure. In order to heal from your surgery, it is CRITICAL to get adequate nutrition. Your body requires vitamins, minerals, and protein. Vegetables are the best source of vitamins and minerals. Vegetables also provide the perfect balance of protein. Processed food has little nutritional value, so try to avoid this.  Medications  Resume taking all of your medications. If your incision is causing pain, you may take over-the counter pain relievers such as acetaminophen (Tylenol). If you were prescribed a stronger pain medication, please be aware these medications can cause nausea and constipation. Prevent  nausea by taking the medication with a snack or meal. Avoid constipation by drinking plenty of fluids and eating foods with high amount of fiber, such as fruits, vegetables, and grains. Do not take Tylenol if you are taking prescription pain medications.     Follow up Your surgeon may want to see you in the office following your access surgery. If so, this will be arranged at the time of your surgery.  Please call us immediately for any of the following conditions:  Increased pain, redness, drainage (pus) from your incision site Fever of 101 degrees or higher Severe or worsening pain at your incision site Hand pain or numbness.  Reduce your risk of vascular disease:  Stop smoking. If you would like help, call [facility] at 1-800-QUIT-NOW 904 137 7824) or [place] at [place] your cholesterol Maintain a desired weight Control your diabetes Keep your blood pressure down  Dialysis  It will take several weeks to several months for your new dialysis access to be ready for use. Your surgeon will determine when it is OK to use it. Your nephrologist will continue to direct your dialysis. You can continue to use your Permcath until your new access is ready for use.   05/22/2017 Savannah Jackson [id number] December 28, 1955  Surgeon(s): Elam Dutch, MD  Procedure(s): BRACHIAL ARTERY TO [place]  FISTULA CREATION   May stick graft immediately   May stick graft on designated area only:    Do not stick graft for 12 weeks    If you have any questions, please call  the office at [phone].

## 2017-05-22 NOTE — H&P (View-Only) (Signed)
Referring Physician: Hinda Lenis  Patient name: Savannah Jackson MRN: [id number] DOB: Oct 25, 1956 Sex: female  REASON FOR CONSULT: hemodialysis access  HPI: Savannah Jackson is a 61 y.o. female,  who recently started hemodialysis in January of this year. She currently dialyzes via a right-sided catheter. The cause of her renal failure is thought to be secondary to alcohol abuse. 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 She has also recently had a hospital admission for encephalopathy a few weeks ago. She has now improved. She currently dialyzes Monday Wednesday and Friday. She was started on midodrine recently because of hypotension. She states her blood pressure has improved into the 1 teens to 120s since then. She is right-handed. She did have some type of axillary node dissection for breast cancer on the right side in the past. However, the sounds of sentinel node biopsy rather than a full axillary dissection. She does not really have any chronic swelling in her right arm. Other medical problems include chronic lower extremity swelling and cirrhosis as mentioned above. This is currently stable.  Past Medical History:  Diagnosis Date  . Bilateral leg edema   . Brain aneurysm   . Breast disorder    right breast cancer 2002  . Cancer [facility])    breast cancer  . Chronic back pain Diverticultis  . Chronic kidney disease (CKD) stage G3b/A3, moderately decreased glomerular filtration rate (GFR) between 30-44 mL/min/1.73 square meter and albuminuria creatinine ratio greater than 300 mg/g 08/30/2016  . Cirrhosis ([place])    alcoholic  . DDD (degenerative disc disease), lumbar   . Diverticulosis    scope 2014  . ETOH abuse   . Hepatic encephalopathy ([place]) 04/23/2017  . Hepatomegaly    scope 2014  . History of breast cancer 06/07/2016  . Hypertension   . 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 Rotator cuff syndrome of left shoulder   . Sleep apnea   . Thickened endometrium 06/19/2016   Will get endo biopsy    Past Surgical History:  Procedure Laterality Date  . BRAIN SURGERY     aneurysm at [place]  . CATARACT EXTRACTION Bilateral    [facility] 2 or 3 years ago  . CHOLECYSTECTOMY    . COLONOSCOPY N/A 04/10/2013   Procedure: COLONOSCOPY;  Surgeon: Rogene Houston, MD;  [facility];  Service: Endoscopy;  Laterality: N/A;  1030-rescheduled to 1200 Ann notified pt  . ESOPHAGOGASTRODUODENOSCOPY N/A 04/22/2015   Procedure: ESOPHAGOGASTRODUODENOSCOPY ([facility]);  Surgeon: Rogene Houston, MD;  [facility];  Service: Endoscopy;  Laterality: N/A;  . IR GENERIC HISTORICAL  07/04/2016   IR RADIOLOGIST EVAL & MGMT 07/04/2016 Jacqulynn Cadet, MD [facility] INTERV RAD  . RADIOLOGY WITH ANESTHESIA N/A 07/16/2015   Procedure: TIPS;  Surgeon: Medication Radiologist, MD;  Location: [place];  Service: Radiology;  Laterality: N/A;  . TOTAL KNEE ARTHROPLASTY     right. 2002    Family History  Problem Relation Age of Onset  . Aneurysm Mother   . Other Daughter        knee replacement  . Hypertension Daughter     SOCIAL HISTORY: Social History   Social History  . Marital status: Married    Spouse name: N/A  . Number of children: N/A  . Years of education: N/A   Occupational History  .  disabled    Social History Main Topics  . Smoking status: Never Smoker  . Smokeless tobacco: Never Used  . Alcohol use No     Comment: none since 03/2015  . Drug use: No  . Sexual activity: Not Currently    Birth control/ protection: Post-menopausal   Other Topics Concern  . 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 However he is fairly capable of taking care of himself at home. This couple have an attentive daughter who keeps an eye on both  of them.    Allergies  Allergen Reactions  . Latex Swelling    Discoloration of skin.   . Vicodin [Hydrocodone-Acetaminophen] Itching    Current Outpatient Prescriptions  Medication Sig Dispense Refill  . lactulose (CHRONULAC) 10 GM/15ML solution Take 45 mLs (30 g total) by mouth 2 (two) times daily. Goal of 3 bowel movements per day 240 mL 0  . midodrine (PROAMATINE) 5 MG tablet Take 5 mg by mouth daily.    . rifaximin (XIFAXAN) 550 MG TABS tablet Take 550 mg by mouth 2 (two) times daily.     No current facility-administered medications for this visit.     ROS:   General:  No weight loss, Fever, chills  HEENT: No recent headaches, no nasal bleeding, no visual changes, no sore throat  Neurologic: No dizziness, blackouts, seizures. No recent symptoms of stroke or mini- stroke. No recent episodes of slurred speech, or temporary blindness.  Cardiac: No recent episodes of chest pain/pressure, no shortness of breath at rest.  No shortness of breath with exertion.  Denies history of atrial fibrillation or irregular heartbeat  Vascular: No history of rest pain in feet.  No history of claudication.  No history of non-healing ulcer, No history of DVT   Pulmonary: No home oxygen, no productive cough, no hemoptysis,  No asthma or wheezing  Musculoskeletal:  [ ]  Arthritis, [ ]  Low back pain,  [ ]  Joint pain  Hematologic:No history of hypercoagulable state.  No history of easy bleeding.  No history of anemia  Gastrointestinal: No hematochezia or melena,  No gastroesophageal reflux, no trouble swallowing  Urinary: [X]  chronic Kidney disease, [X]  on HD - [X]  MWF or [ ]  TTHS, [ ]  Burning with urination, [ ]  Frequent urination, [ ]  Difficulty urinating;   Skin: No rashes  Psychological: No history of anxiety,  No history of depression   Physical Examination  Vitals:   05/03/17 1103  BP: 103/73  Pulse: 64  Resp: 16  Temp: (!) 97.4 F (36.3 C)  TempSrc: Oral  SpO2: 100%  Weight:  158 lb (71.7 kg)  Height: 5\' 3"  (1.6 m)    Body mass index is 27.99 kg/m.  General:  Alert and oriented, no acute distress HEENT: Normal Neck: No bruit or JVD Pulmonary: Clear to auscultation bilaterally Cardiac: Regular Rate and Rhythm without murmur Abdomen: Soft, non-tender Skin: No rash Extremity Pulses:  2+ radial, brachial pulses bilaterally Musculoskeletal: No deformity or edema  Neurologic: Upper and lower extremity motor 5/5 and symmetric  DATA:  Patient had a externally arterial duplex and vein mapping ultrasound today. The arterial diameter was 3 mm at the antecubital level bilaterally. Radial artery was just over 2 mm at the wrist bilaterally. 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 It is small on the left side less than 2 mm. Basilic vein was to a half to  3 mm in the upper arm bilaterally.  ASSESSMENT:  Patient needs long-term hemodialysis access. She does have a reasonable vein in the upper arm for consideration of basilic vein fistula on both sides. However the left vein is of better quality. 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 Although the vein is in the usable category it may be quite small and may not mature for an AV fistula. Had lengthy discussion with the patient her husband and her daughter regarding advantages and disadvantages of graft versus fistula and informed them that the fistula may not get to a size that we would find usable for access. In that case she may have to have a graft down the road. I believe it is reasonable to try a first stage basilic vein transposition fistula. If this does mature we would proceed with second stage. Otherwise we will place an AV graft.   PLAN:  First stage basilic fistula 97/41/6384. Risks benefits possible palpitations and procedure details including but not limited to bleeding infection  non-maturation of the fistula ischemic steal all were discussed patient and her family. She does have some underlying numbness and tingling in both hands and I discussed with her that this may get worse with a fistula creation. She understands and agrees to proceed.   Ruta Hinds, MD [facility] [place] Office: [phone] Pager: [phone]

## 2017-05-22 NOTE — Interval H&P Note (Signed)
History and Physical Interval Note:  05/22/2017 9:30 AM  Savannah Jackson  has presented today for surgery, with the diagnosis of End Stage Renal Disease  N18.6  The various methods of treatment have been discussed with the patient and family. After consideration of risks, benefits and other options for treatment, the patient has consented to  Procedure(s): ARTERIOVENOUS (AV) FISTULA CREATION (Left) as a surgical intervention .  The patient's history has been reviewed, patient examined, no change in status, stable for surgery.  I have reviewed the patient's chart and labs.  Questions were answered to the patient's satisfaction.     Ruta Hinds

## 2017-05-22 NOTE — Anesthesia Procedure Notes (Signed)
Procedure Name: MAC Date/Time: 05/22/2017 9:55 AM Performed by: Orlie Dakin Pre-anesthesia Checklist: Patient identified, Emergency Drugs available, Suction available, Patient being monitored and Timeout performed Patient Re-evaluated:Patient Re-evaluated prior to induction Oxygen Delivery Method: Nasal cannula

## 2017-05-23 ENCOUNTER — Telehealth: Payer: Self-pay | Admitting: Vascular Surgery

## 2017-05-23 ENCOUNTER — Encounter (HOSPITAL_COMMUNITY): Payer: Self-pay | Admitting: Vascular Surgery

## 2017-05-23 DIAGNOSIS — Z992 Dependence on renal dialysis: Secondary | ICD-10-CM | POA: Diagnosis not present

## 2017-05-23 DIAGNOSIS — Z23 Encounter for immunization: Secondary | ICD-10-CM | POA: Diagnosis not present

## 2017-05-23 DIAGNOSIS — N186 End stage renal disease: Secondary | ICD-10-CM | POA: Diagnosis not present

## 2017-05-23 NOTE — Telephone Encounter (Signed)
Sched appt 07/12/17; lab at 11:00 and MD at 11:30. Lm on hm#.

## 2017-05-23 NOTE — Telephone Encounter (Signed)
-----   Message from Mena Goes, RN sent at 05/22/2017 11:53 AM EDT ----- Regarding: 6 weeks with duplex   ----- Message ----- From: Alvia Grove, PA-C Sent: 05/22/2017  11:41 AM To: Vvs Charge Pool  S/p left 1st stage basilic vein transposition 05/22/17  F/u with Dr. Oneida Alar in 6 weeks with duplex  Thanks Maudie Mercury

## 2017-05-23 NOTE — Op Note (Signed)
Procedure: Left basilic vein transposition fistula first stage, Ultrasound guidance  Preoperative diagnosis: End-stage renal disease  Postoperative diagnosis: Same  Anesthesia: Gen.  Assistant: Silva Bandy, [facility]  Operative findings: 3 mm left basilic vein, 7.7-9 mm left brachial artery  Operative details: After obtaining informed consent, the patient was taken to the operating room. The patient was placed in supine position operating table. After adequate sedation, the patient's entire left upper extremity was prepped and draped in the usual sterile fashion. Next ultrasound was used to identify the left basilic vein. Local anesthesia was infiltrated over this.  A longitudinal incision was made over the ulnar aspect of the forearm in order to expose the basilic vein. The vein was of good quality approximately 3 mm in diameter.  Care was taken to try to not injure any sensory nerves and all the motor nerves were identified and protected. The vein was dissected free circumferentially and small side branches ligated and divided between silk ties or clips. Local anesthesia was then infiltrated at the antecubital crease.  An incision was made in this location in longitudinal fashion.  Next the brachial artery was exposed. The artery was approximately 2.5-3 mm in diameter with some spasm. This was dissected free circumferentially. Vessel loops were placed around it. There was some spasm within the artery after dissecting it free. Next the distal basilic vein was ligated with a 2 silk tie and the vein transected.  The patient was given 5000 units of intravenous heparin. The vein was tunneled subcutaneously over to the arterial incision.  Vessel loops were used to control the artery proximally and distally. A longitudinal arteriotomy was made with an 11 blade.  The vein was cut to length and sewn end of vein to side of artery using a running 7-0 Prolene suture. Just prior to completion of the anastomosis, it was  forebled backbled and thoroughly flushed. The anastomosis was secured and the Vesseloops were released.  There was a palpable thrill in the proximal aspect of the fistula. There was also good Doppler flow throughout the course of the fistula. At this point all subcutaneous tissues were reapproximated using running 3-0 Vicryl suture. All skin incisions were closed with running 4  0 Vicryl subcuticular stitch. Dermabond was applied to all incisions. The patient tolerated the procedure well and there were no complications. Instrument sponge needle counts were correct at the end of the case. The patient was taken to the recovery room in stable condition.  Ruta Hinds, MD [facility] [place] Office: [phone] Pager: [phone]

## 2017-05-24 ENCOUNTER — Other Ambulatory Visit: Payer: Self-pay | Admitting: *Deleted

## 2017-05-24 NOTE — Patient Outreach (Signed)
[employer] High Risk patient screening. Mrs. Mohiuddin did not answer my call today. I left a message and requested a return call.  Chart review: Pt has end-stage kidney disease and liver disease. She is on the transplant list and makes frequent visits to [facility] for her care. She goes to dialysis in [place] [facility] [place] 3 times a week (Mondays, Wednesdays and Fridays). She is receiving a chemo therapy treatment: Rituximab. [facility] community care manager and pharmacy have been previously engaged end of 2016, beginning of 2017.  I will call back tomorrow.  Eulah Pont. Myrtie Neither, MSN, [facility] Gerontological Nurse Practitioner [facility] Care Management [phone]

## 2017-05-25 ENCOUNTER — Encounter: Payer: Self-pay | Admitting: *Deleted

## 2017-05-25 ENCOUNTER — Other Ambulatory Visit: Payer: Self-pay | Admitting: *Deleted

## 2017-05-25 DIAGNOSIS — N186 End stage renal disease: Secondary | ICD-10-CM | POA: Diagnosis not present

## 2017-05-25 DIAGNOSIS — Z23 Encounter for immunization: Secondary | ICD-10-CM | POA: Diagnosis not present

## 2017-05-25 DIAGNOSIS — Z992 Dependence on renal dialysis: Secondary | ICD-10-CM | POA: Diagnosis not present

## 2017-05-25 NOTE — Patient Outreach (Signed)
[employer] High risk patient screen. Mrs. Messenger has ESRD and is on dialysis Mondays, Wednesdays and Fridays. She is also on the kidney and liver transplant list and makes frequent visits to [facility] for transplant preparation. She says she does not need a nurse as she sees nurses all the time but she does admit to financial challenges in paying for her Rifaximin 550 mg that she takes bid. She has received this with some pharmaceutical assistance but whoever was helping her with this has not been in touch and she has enough for 3 more days. She and her husband (who is a double amputee) struggle to have enough money to pay for gas to go to [facility].  I am sending her an introductory letter, she has had services from [facility] in the past. I am referring her for pharmacy and social work assistance.  Eulah Pont. Myrtie Neither, MSN, [facility] Gerontological Nurse Practitioner [facility] Care Management [phone]

## 2017-05-28 ENCOUNTER — Other Ambulatory Visit: Payer: Self-pay | Admitting: Licensed Clinical Social Worker

## 2017-05-28 DIAGNOSIS — Z992 Dependence on renal dialysis: Secondary | ICD-10-CM | POA: Diagnosis not present

## 2017-05-28 DIAGNOSIS — N186 End stage renal disease: Secondary | ICD-10-CM | POA: Diagnosis not present

## 2017-05-28 DIAGNOSIS — Z23 Encounter for immunization: Secondary | ICD-10-CM | POA: Diagnosis not present

## 2017-05-28 NOTE — Patient Outreach (Signed)
Assessment:  CSW received referral on 05/28/17 on Shanyn S. [employer]bro related to gas expense needs of Sruthi S. Shugart. CSW completed chart review for [employer] on 05/28/17. Client attends dialysis locally 3 days weekly as scheduled. Client also is on kidney transplant waiting list in [place], [place]. Client has difficulty with gas expenses for travel to and from her [place], [place] medical appointments.  CSW and client completed [facility] Financial Assessment Form for client on 05/28/17.  CSW obtained one [place] for $50.00 for client.  CSW took [facility] Giving Committee $ 50.00 Gift Card and delivered card to client at her home on 05/28/17.  CSW also spoke with client about two other social workers assigned to assist with her social work needs. CSW encouraged client to talk with dialysis social worker and seek assistance from dialysis social worker related to social work needs of client. CSW also encouraged client to talk with her social worker assigned at [place] for community assistance support. CSW encouraged client to be persistent in seeking help from these social workers and to be persistent in  making requests for help from her two other social workers.  Since CSW Theadore Nan delivered client the [place] on 05/28/17, CSW Theadore Nan has no other CSW needs to address through [facility] Psychiatric program for client at this time. Client will be talking to her two other social workers about ongoing social work needs of client.  CSW is closing referral to [place] on 05/28/17 since client received financial help with [place] Card assistance.     Norva Riffle.Nathalia Wismer MSW, LCSW Licensed Clinical Social Worker [facility] Care Management [phone]

## 2017-05-29 ENCOUNTER — Telehealth: Payer: Self-pay | Admitting: Pharmacist

## 2017-05-29 DIAGNOSIS — N186 End stage renal disease: Secondary | ICD-10-CM | POA: Diagnosis not present

## 2017-05-29 DIAGNOSIS — K7290 Hepatic failure, unspecified without coma: Secondary | ICD-10-CM | POA: Diagnosis not present

## 2017-05-29 DIAGNOSIS — Z992 Dependence on renal dialysis: Secondary | ICD-10-CM | POA: Diagnosis not present

## 2017-05-29 NOTE — Patient Outreach (Signed)
[place] [facility]) Care Management  05/29/2017  KAAVYA PUSKARICH 09/14/1956 [id number]   Patient was called regarding medication assistance with Xifaxan per referral from [place] Practitioner, Christin Bach.  HIPAA identifiers were obtained.  Patient shared she was having difficulty affording Xifaxan. She reported she had been getting it through the mail but now would need to use [facility] and was told it would be very expensive.  Review of the patient's insurance shows she is Dually Eligible with [employer] and [place] Medicaid.  In addition, review of the [place] revealed Doreene Nest is covered as a tier 5 medication with prior authorization.  [facility] in [place] was called to see if the prior authorization process had been started. The Pharmacist at [facility] said they did not have an active Xifaxan prescription on file for the patient.  The last Xifaxan prescription at their pharmacy was from 2016.  Patient confirmed she has not taken a prescription to [facility] recently. She said she was given samples by Dr. Shonna Chock and that his nurse Lynelle Smoke) was working on Photographer for her.   Dr. Gabriel Carina office was called to inquire. Their office closes at 4:30pm.  A message was left for Tammy.  Awaiting a call back.  Plan:  Call Dr. Gabriel Carina office within 1-2 business days.  Follow up with the patient.

## 2017-05-29 NOTE — Telephone Encounter (Signed)
-----   Message from Jiles Harold sent at 05/25/2017  3:18 PM EDT ----- Regarding: Referral: Order for Almetta Lovely  Referral from Deloria Lair, NP  "Please see below request"  Forde Radon ----- Message ----- From: Deloria Lair, NP Sent: 05/25/2017   3:15 PM To: Thn Cm Communication Orders Subject: Order for ARLIENE, ROSENOW                     Patient Name: Savannah Jackson, Savannah Jackson([id number]) Sex: Female DOB: 02-02-1956    PCP: FANTA, [place]: [facility]   Types of orders made on 05/25/2017: Nursing  Order Date:05/25/2017 Ordering User:SPINKS, [place] Encounter Provider:Spinks, Kayleen Memos, NP [[id number]] Authorizing Provider: Deloria Lair, NP [phone] Type of Supervision:No Supervision Department:[facility][[id number]]  Order Specific Information Order: Comm to Pharmacy [Custom: [id number]]  Order #: [id number] Qty: 1   Priority: Routine  Class: Clinic Performed   Comment:Pt needs pharmacy assistance for her Rifaximin, please, if possible.            See my note. Thank you! [place]     Priority: Routine  Class: Clinic Performed   Comment:Pt needs pharmacy assistance for her Rifaximin, please, if possible.            See my note. Thank you! Kayleen Memos

## 2017-05-30 ENCOUNTER — Other Ambulatory Visit: Payer: Self-pay | Admitting: Pharmacist

## 2017-05-30 ENCOUNTER — Other Ambulatory Visit (INDEPENDENT_AMBULATORY_CARE_PROVIDER_SITE_OTHER): Payer: Self-pay | Admitting: *Deleted

## 2017-05-30 DIAGNOSIS — N186 End stage renal disease: Secondary | ICD-10-CM | POA: Diagnosis not present

## 2017-05-30 DIAGNOSIS — Z992 Dependence on renal dialysis: Secondary | ICD-10-CM | POA: Diagnosis not present

## 2017-05-30 NOTE — Patient Outreach (Signed)
[facility]) Care Management  05/30/2017  Savannah Jackson 1956-06-21 [id number]   Called  Dr. Gabriel Carina office and left another message for  his nurse Savannah Jackson) as the patient said Savannah Jackson was working on Photographer for her.   A referral was sent to help patient with Xifaxan affordability.  Patient is dually eligible and it looks as though Savannah Jackson is covered on her formulary but requires a prior authorization.   Called patient to update her on the status.  No answer. HIPAA compliant message left on her voicemail.  Awaiting a call back.  Plan:  Call Dr. Gabriel Carina office within 1-2 business days.

## 2017-06-01 ENCOUNTER — Other Ambulatory Visit: Payer: Self-pay | Admitting: Pharmacist

## 2017-06-01 DIAGNOSIS — Z992 Dependence on renal dialysis: Secondary | ICD-10-CM | POA: Diagnosis not present

## 2017-06-01 DIAGNOSIS — N186 End stage renal disease: Secondary | ICD-10-CM | POA: Diagnosis not present

## 2017-06-01 NOTE — Patient Outreach (Addendum)
[place] [facility]) Care Management  06/01/2017  Savannah Jackson 1956-08-04 [id number]   Received a call from [place]  office in reference to Xifaxan medication assistance. According to Tammy, Dr. Robb Matar office sent the patient a blank application [employer]) for Xifaxan with the instructions for her to complete her portion, provide the necessary financial documentation, and mail the application back to their office. Tammy said the patient told her she sent the application back to [place]. Unfortunately, the application was missing the provider signature and several necessary portions.   The [employer] representative recommended a new application be submitted.  The patient was called to follow up with her. She did not answer the phone and the phone rang >30 times without a voicemail picking up.  As was researched earlier, the patient appears to have Medicaid and [employer]. As such, Xifaxan is a tier 5 medication and requires a prior authorization.  Patient's with Medicaid are traditionally not eligible for patient assistance programs.  Dr. Laural Golden office was called back to ask for a new prescription to be sent to the patient's pharmacy to begin the prior authorization process but their office closes at 11:30am on Fridays.  Another message was left for Tammy.  Plan:  Follow up with the patient and Tammy at Dr. Gabriel Carina office next week.

## 2017-06-04 ENCOUNTER — Ambulatory Visit
Admission: RE | Admit: 2017-06-04 | Discharge: 2017-06-04 | Payer: MEDICARE | Attending: Nephrology | Admitting: Nephrology

## 2017-06-04 ENCOUNTER — Other Ambulatory Visit (INDEPENDENT_AMBULATORY_CARE_PROVIDER_SITE_OTHER): Payer: Self-pay | Admitting: *Deleted

## 2017-06-04 ENCOUNTER — Other Ambulatory Visit: Payer: Self-pay | Admitting: Pharmacist

## 2017-06-04 DIAGNOSIS — N186 End stage renal disease: Secondary | ICD-10-CM | POA: Diagnosis not present

## 2017-06-04 DIAGNOSIS — Z992 Dependence on renal dialysis: Secondary | ICD-10-CM | POA: Diagnosis not present

## 2017-06-04 MED ORDER — RIFAXIMIN 550 MG PO TABS: 550.0000 mg | ORAL_TABLET | Freq: Two times a day (BID) | ORAL | 5 refills | Status: DC

## 2017-06-04 NOTE — Patient Outreach (Signed)
[facility]) Care Management  06/04/2017  AUNA MIKKELSEN 1956/06/23 [id number]   Called Dr. Ewell Poe office and spoke with [place]. Tammy was asked to send in a prescription on Dr. Ewell Poe behalf for Xifaxan for the patient. The message from Domingo Dimes was relayed to [facility].  It was also discussed that Xifaxan appears to be on the patient's Medicare Part D Formulary.  As such, Tammy said she would send a new prescription to the patient's pharmacy and begin the prior authorization process if necessary.  Patient was called to let her know the status of things. A message was left with her husband as he reported she was at dialysis.  Plan:  Follow up on the prior authorization in 2-3 business days.  Elayne Guerin, PharmD, [place] Clinical Pharmacist [phone]

## 2017-06-04 NOTE — Telephone Encounter (Signed)
We are trying to assist the patient with getting this prescription filled. We have sent a new rx into the patient's pharmacy.

## 2017-06-05 DIAGNOSIS — K7290 Hepatic failure, unspecified without coma: Secondary | ICD-10-CM | POA: Diagnosis not present

## 2017-06-06 DIAGNOSIS — N186 End stage renal disease: Secondary | ICD-10-CM | POA: Diagnosis not present

## 2017-06-06 DIAGNOSIS — Z992 Dependence on renal dialysis: Secondary | ICD-10-CM | POA: Diagnosis not present

## 2017-06-07 ENCOUNTER — Other Ambulatory Visit: Payer: Self-pay | Admitting: Pharmacist

## 2017-06-07 NOTE — Patient Outreach (Signed)
[facility]) Care Management  06/07/2017  Savannah Jackson May 26, 1956 [id number]   Called [facility] on the patient's behalf to check on the status of the Xifaxan prescription.  According to [facility], the prescription went through but had a copay of $548.    Called the patient. HIPAA identifiers were obtained.  It was explained to the patient that the copay was $548. A decision was made to call [employer] with the patient on conference for more information.  [employer] was called. The representative confirmed the patient does not have LIS, "Extra Help", or is qualified as Dually Eligible in their system.  The patient's medical record lists her as having [place] Medicaid but there is not a copy of a Medicaid card on file.  After preliminary financial assessment with the patient, it was determined she is over income for "Extra Help".  Patient partially completed an application with [employer] Patient Assistance Program earlier this year but a new application needs to be completed.  Dr. Ewell Poe office was called and a message was left for Tammy informing her about the applications and to request more samples for the patient.  [employer] Patient Assistance Program was called to recheck the eligibility requirements.   Plan:  A partially completed application will be faxed to [place] at Dr. Ewell Poe office  Patient understands she needs to coordinate with Tammy at Dr. Ewell Poe office to sign the application and provid copies of the Social Security Benefits letters for herself and her spouse. To be included with the application.  Follow up on status of application in 5-7 days.

## 2017-06-08 DIAGNOSIS — Z992 Dependence on renal dialysis: Secondary | ICD-10-CM | POA: Diagnosis not present

## 2017-06-08 DIAGNOSIS — N186 End stage renal disease: Secondary | ICD-10-CM | POA: Diagnosis not present

## 2017-06-08 DIAGNOSIS — Z01818 Encounter for other preprocedural examination: Secondary | ICD-10-CM

## 2017-06-08 DIAGNOSIS — Z7682 Awaiting organ transplant status: Principal | ICD-10-CM

## 2017-06-11 ENCOUNTER — Other Ambulatory Visit: Payer: Self-pay | Admitting: Pharmacist

## 2017-06-11 DIAGNOSIS — N186 End stage renal disease: Secondary | ICD-10-CM | POA: Diagnosis not present

## 2017-06-11 DIAGNOSIS — Z992 Dependence on renal dialysis: Secondary | ICD-10-CM | POA: Diagnosis not present

## 2017-06-11 NOTE — Patient Outreach (Addendum)
[place] [facility]) Care Management  06/11/2017  LATEISHA THURLOW 1956-05-17 [id number]   Patient was called regarding medication assistance with Xifaxan. Tammy from Dr. Ewell Poe office confirmed they received the application.  Patient was called today to remind her to go to Dr. Ewell Poe office to sign the paper work, provide financial documentation and pick up samples.  Unfortunately, the patient did not answer. HIPAA compliant message was left on her voicemail.  Plan:  Follow up in 3-5 business days.  Elayne Guerin, PharmD, BCACP [facility] Clinical Pharmacist [phone]  ADDENDUM:  Patient called me back but I was unable to answer the call. I called the patient back and left a message with her husband, (who is on her consent form).   Elayne Guerin, PharmD, [place] Clinical Pharmacist [phone]

## 2017-06-12 ENCOUNTER — Ambulatory Visit
Admission: RE | Admit: 2017-06-12 | Discharge: 2017-06-12 | Disposition: A | Discharge status: Home / Self Care | Payer: MEDICARE

## 2017-06-12 ENCOUNTER — Ambulatory Visit
Admission: RE | Admit: 2017-06-12 | Discharge: 2017-06-12 | Disposition: A | Discharge status: Home / Self Care | Payer: MEDICARE | Attending: Registered" | Admitting: Registered"

## 2017-06-12 DIAGNOSIS — K7460 Unspecified cirrhosis of liver: Secondary | ICD-10-CM | POA: Diagnosis not present

## 2017-06-12 DIAGNOSIS — M549 Dorsalgia, unspecified: Secondary | ICD-10-CM | POA: Diagnosis not present

## 2017-06-12 DIAGNOSIS — D638 Anemia in other chronic diseases classified elsewhere: Secondary | ICD-10-CM | POA: Diagnosis not present

## 2017-06-12 DIAGNOSIS — G4730 Sleep apnea, unspecified: Secondary | ICD-10-CM | POA: Diagnosis not present

## 2017-06-12 DIAGNOSIS — N186 End stage renal disease: Secondary | ICD-10-CM | POA: Diagnosis not present

## 2017-06-12 DIAGNOSIS — G8929 Other chronic pain: Secondary | ICD-10-CM | POA: Diagnosis not present

## 2017-06-12 DIAGNOSIS — K7030 Alcoholic cirrhosis of liver without ascites: Secondary | ICD-10-CM | POA: Diagnosis not present

## 2017-06-12 DIAGNOSIS — I8500 Esophageal varices without bleeding: Secondary | ICD-10-CM | POA: Diagnosis not present

## 2017-06-12 DIAGNOSIS — I129 Hypertensive chronic kidney disease with stage 1 through stage 4 chronic kidney disease, or unspecified chronic kidney disease: Secondary | ICD-10-CM | POA: Diagnosis not present

## 2017-06-12 DIAGNOSIS — K7290 Hepatic failure, unspecified without coma: Secondary | ICD-10-CM | POA: Diagnosis not present

## 2017-06-12 DIAGNOSIS — Z01810 Encounter for preprocedural cardiovascular examination: Secondary | ICD-10-CM | POA: Diagnosis not present

## 2017-06-12 DIAGNOSIS — Z7682 Awaiting organ transplant status: Secondary | ICD-10-CM

## 2017-06-12 DIAGNOSIS — Z01818 Encounter for other preprocedural examination: Secondary | ICD-10-CM

## 2017-06-13 DIAGNOSIS — N186 End stage renal disease: Secondary | ICD-10-CM | POA: Diagnosis not present

## 2017-06-13 DIAGNOSIS — Z992 Dependence on renal dialysis: Secondary | ICD-10-CM | POA: Diagnosis not present

## 2017-06-14 ENCOUNTER — Other Ambulatory Visit: Payer: Self-pay | Admitting: Pharmacist

## 2017-06-14 NOTE — Patient Outreach (Signed)
[place] [facility]) Care Management  06/14/2017  Savannah Jackson 04-08-1956 [id number]   Patient was called to follow up on medication assistance with Xifaxan.  On first call, patient did not answer. A HIPAA compliant message was left on her voicemail.  Patient called back and I was able to update her on the status of her Xifaxan application.    Tammy from Dr. Ewell Poe office called and reported she received the Xifaxan application I sent her, the financial information from the patient and signature from the patient.  Patient faxed the forms to [facility] Patient Assistance Program.  Plan: Follow up with [employer] and the patient in 7-10 business days.  Elayne Guerin, PharmD, [place] Clinical Pharmacist [phone]

## 2017-06-15 DIAGNOSIS — Z992 Dependence on renal dialysis: Secondary | ICD-10-CM | POA: Diagnosis not present

## 2017-06-15 DIAGNOSIS — N186 End stage renal disease: Secondary | ICD-10-CM | POA: Diagnosis not present

## 2017-06-18 DIAGNOSIS — N186 End stage renal disease: Secondary | ICD-10-CM | POA: Diagnosis not present

## 2017-06-18 DIAGNOSIS — Z992 Dependence on renal dialysis: Secondary | ICD-10-CM | POA: Diagnosis not present

## 2017-06-19 DIAGNOSIS — K7290 Hepatic failure, unspecified without coma: Secondary | ICD-10-CM | POA: Diagnosis not present

## 2017-06-20 DIAGNOSIS — Z992 Dependence on renal dialysis: Secondary | ICD-10-CM | POA: Diagnosis not present

## 2017-06-20 DIAGNOSIS — N186 End stage renal disease: Secondary | ICD-10-CM | POA: Diagnosis not present

## 2017-06-21 ENCOUNTER — Ambulatory Visit
Admission: RE | Admit: 2017-06-21 | Discharge: 2017-06-21 | Disposition: A | Discharge status: Home / Self Care | Payer: MEDICARE

## 2017-06-21 ENCOUNTER — Ambulatory Visit
Admission: RE | Admit: 2017-06-21 | Discharge: 2017-06-21 | Disposition: A | Discharge status: Home / Self Care | Payer: MEDICARE | Attending: Clinical | Admitting: Clinical

## 2017-06-21 ENCOUNTER — Ambulatory Visit
Admission: RE | Admit: 2017-06-21 | Discharge: 2017-06-21 | Disposition: A | Discharge status: Home / Self Care | Payer: MEDICARE | Attending: Student in an Organized Health Care Education/Training Program | Admitting: Student in an Organized Health Care Education/Training Program

## 2017-06-21 DIAGNOSIS — Z01818 Encounter for other preprocedural examination: Secondary | ICD-10-CM | POA: Diagnosis not present

## 2017-06-21 DIAGNOSIS — K7460 Unspecified cirrhosis of liver: Secondary | ICD-10-CM | POA: Diagnosis not present

## 2017-06-21 DIAGNOSIS — N179 Acute kidney failure, unspecified: Secondary | ICD-10-CM | POA: Diagnosis not present

## 2017-06-21 DIAGNOSIS — Z7189 Other specified counseling: Secondary | ICD-10-CM | POA: Diagnosis not present

## 2017-06-21 DIAGNOSIS — K7290 Hepatic failure, unspecified without coma: Secondary | ICD-10-CM | POA: Diagnosis not present

## 2017-06-21 DIAGNOSIS — Z992 Dependence on renal dialysis: Secondary | ICD-10-CM | POA: Diagnosis not present

## 2017-06-21 DIAGNOSIS — I8510 Secondary esophageal varices without bleeding: Secondary | ICD-10-CM | POA: Diagnosis not present

## 2017-06-21 DIAGNOSIS — K766 Portal hypertension: Secondary | ICD-10-CM | POA: Diagnosis not present

## 2017-06-21 DIAGNOSIS — K7031 Alcoholic cirrhosis of liver with ascites: Secondary | ICD-10-CM | POA: Diagnosis not present

## 2017-06-21 DIAGNOSIS — Z01810 Encounter for preprocedural cardiovascular examination: Secondary | ICD-10-CM | POA: Diagnosis not present

## 2017-06-21 DIAGNOSIS — K3189 Other diseases of stomach and duodenum: Secondary | ICD-10-CM | POA: Diagnosis not present

## 2017-06-21 DIAGNOSIS — Z7682 Awaiting organ transplant status: Secondary | ICD-10-CM | POA: Diagnosis not present

## 2017-06-21 DIAGNOSIS — N186 End stage renal disease: Secondary | ICD-10-CM

## 2017-06-21 DIAGNOSIS — F1011 Alcohol abuse, in remission: Principal | ICD-10-CM

## 2017-06-22 DIAGNOSIS — N186 End stage renal disease: Secondary | ICD-10-CM | POA: Diagnosis not present

## 2017-06-22 DIAGNOSIS — Z992 Dependence on renal dialysis: Secondary | ICD-10-CM | POA: Diagnosis not present

## 2017-06-25 DIAGNOSIS — N186 End stage renal disease: Secondary | ICD-10-CM | POA: Diagnosis not present

## 2017-06-25 DIAGNOSIS — Z992 Dependence on renal dialysis: Secondary | ICD-10-CM | POA: Diagnosis not present

## 2017-06-26 ENCOUNTER — Other Ambulatory Visit: Payer: Self-pay | Admitting: Pharmacist

## 2017-06-26 DIAGNOSIS — K7290 Hepatic failure, unspecified without coma: Secondary | ICD-10-CM | POA: Diagnosis not present

## 2017-06-26 NOTE — Patient Outreach (Addendum)
[place] [facility]) Care Management  06/26/2017  KIMIE PIDCOCK [birth year] [id number]   Called [employer] Patient Assistance Program to check on the status of the patient's application. The representative said the patient's application was denied because her income is close to being <330% of poverty which means she would qualify for the low income subsidy program. Preliminary financial assessment showed the patient was over income for the "Extra Help" program. The representative said the patient could be approved if she receives a denial letter from the Extra Help Program.  A message was left for Thomas Hoff, Dr. Ewell Poe nurse and the patient was called as well.  Patient did not answer HIPAA compliant message was left on her voicemail.    Plan: Call patient back in 3-5 business days.   Elayne Guerin, PharmD, [place] Clinical Pharmacist [phone]  ADDENDUM  Patient called back. HIPAA identifiers were obtained. The status of the patient assistance application was explained to the patient. Patient said she received a letter yesterday saying she is eligible for Medicaid.  [facility] was called. I spoke with the Pharmacist who was asked to run the prescription back through with today's date on the patient's insurance to see if the Medicaid portion had been updated. The pharmacist reported the prescription went through on [employer] with a $8.35 copay.  Relayed the message of the paid claim to the patient who said she would go and pick the prescription up today and to Tammy at Dr. Ewell Poe office.  Plan:  Close pharmacy case as patient's medications are now billable to [facility] with a copay the patient can afford.   Elayne Guerin, PharmD, [place] Clinical Pharmacist [phone]

## 2017-06-27 ENCOUNTER — Other Ambulatory Visit (HOSPITAL_COMMUNITY): Payer: Self-pay | Admitting: Radiology

## 2017-06-27 ENCOUNTER — Other Ambulatory Visit (HOSPITAL_COMMUNITY): Payer: Self-pay | Admitting: Interventional Radiology

## 2017-06-27 DIAGNOSIS — N186 End stage renal disease: Secondary | ICD-10-CM | POA: Diagnosis not present

## 2017-06-27 DIAGNOSIS — J918 Pleural effusion in other conditions classified elsewhere: Secondary | ICD-10-CM

## 2017-06-27 DIAGNOSIS — K769 Liver disease, unspecified: Principal | ICD-10-CM

## 2017-06-27 DIAGNOSIS — J90 Pleural effusion, not elsewhere classified: Secondary | ICD-10-CM

## 2017-06-27 DIAGNOSIS — K7031 Alcoholic cirrhosis of liver with ascites: Secondary | ICD-10-CM

## 2017-06-27 DIAGNOSIS — Z992 Dependence on renal dialysis: Secondary | ICD-10-CM | POA: Diagnosis not present

## 2017-06-28 DIAGNOSIS — Z992 Dependence on renal dialysis: Secondary | ICD-10-CM | POA: Diagnosis not present

## 2017-06-28 DIAGNOSIS — N186 End stage renal disease: Secondary | ICD-10-CM | POA: Diagnosis not present

## 2017-06-28 NOTE — Addendum Note (Signed)
Addended by: Lianne Cure A on: 06/28/2017 03:20 PM   Modules accepted: Orders

## 2017-06-29 DIAGNOSIS — Z992 Dependence on renal dialysis: Secondary | ICD-10-CM | POA: Diagnosis not present

## 2017-06-29 DIAGNOSIS — N186 End stage renal disease: Secondary | ICD-10-CM | POA: Diagnosis not present

## 2017-07-02 DIAGNOSIS — Z992 Dependence on renal dialysis: Secondary | ICD-10-CM | POA: Diagnosis not present

## 2017-07-02 DIAGNOSIS — N186 End stage renal disease: Secondary | ICD-10-CM | POA: Diagnosis not present

## 2017-07-03 ENCOUNTER — Ambulatory Visit: Payer: Self-pay | Admitting: Pharmacist

## 2017-07-03 DIAGNOSIS — K7290 Hepatic failure, unspecified without coma: Secondary | ICD-10-CM | POA: Diagnosis not present

## 2017-07-04 DIAGNOSIS — Z992 Dependence on renal dialysis: Secondary | ICD-10-CM | POA: Diagnosis not present

## 2017-07-04 DIAGNOSIS — N186 End stage renal disease: Secondary | ICD-10-CM | POA: Diagnosis not present

## 2017-07-06 DIAGNOSIS — N186 End stage renal disease: Secondary | ICD-10-CM | POA: Diagnosis not present

## 2017-07-06 DIAGNOSIS — Z992 Dependence on renal dialysis: Secondary | ICD-10-CM | POA: Diagnosis not present

## 2017-07-09 DIAGNOSIS — Z992 Dependence on renal dialysis: Secondary | ICD-10-CM | POA: Diagnosis not present

## 2017-07-09 DIAGNOSIS — N186 End stage renal disease: Secondary | ICD-10-CM | POA: Diagnosis not present

## 2017-07-10 DIAGNOSIS — K7290 Hepatic failure, unspecified without coma: Secondary | ICD-10-CM | POA: Diagnosis not present

## 2017-07-11 ENCOUNTER — Ambulatory Visit
Admission: RE | Admit: 2017-07-11 | Discharge: 2017-07-11 | Payer: Medicare (Managed Care) | Attending: Nephrology | Admitting: Nephrology

## 2017-07-11 DIAGNOSIS — Z992 Dependence on renal dialysis: Secondary | ICD-10-CM | POA: Diagnosis not present

## 2017-07-11 DIAGNOSIS — N186 End stage renal disease: Secondary | ICD-10-CM | POA: Diagnosis not present

## 2017-07-12 ENCOUNTER — Ambulatory Visit (INDEPENDENT_AMBULATORY_CARE_PROVIDER_SITE_OTHER): Payer: Self-pay | Admitting: Vascular Surgery

## 2017-07-12 ENCOUNTER — Encounter (HOSPITAL_COMMUNITY): Payer: Medicare HMO

## 2017-07-12 ENCOUNTER — Ambulatory Visit (HOSPITAL_COMMUNITY)
Admission: RE | Admit: 2017-07-12 | Discharge: 2017-07-12 | Disposition: A | Discharge status: Home / Self Care | Payer: Medicare HMO | Source: Ambulatory Visit | Attending: Vascular Surgery | Admitting: Vascular Surgery

## 2017-07-12 ENCOUNTER — Encounter: Payer: Medicare HMO | Admitting: Vascular Surgery

## 2017-07-12 ENCOUNTER — Encounter: Payer: Self-pay | Admitting: Vascular Surgery

## 2017-07-12 VITALS — BP 98/67 | HR 63 | Temp 97.0°F | Ht 62.0 in | Wt 161.0 lb

## 2017-07-12 DIAGNOSIS — Z48812 Encounter for surgical aftercare following surgery on the circulatory system: Secondary | ICD-10-CM | POA: Insufficient documentation

## 2017-07-12 DIAGNOSIS — Z992 Dependence on renal dialysis: Secondary | ICD-10-CM

## 2017-07-12 DIAGNOSIS — N186 End stage renal disease: Secondary | ICD-10-CM

## 2017-07-12 NOTE — Progress Notes (Signed)
Patient is a 61 year old female who returns for follow-up today. She had a first stage basilic vein fistula placed 05/22/2017. She is currently dialyzing via a right-sided catheter on Monday Wednesday Friday.  She does complain of some numbness and tingling occasionally in her left hand. She is able to move the hand around some and then her symptoms resolved. Although she states the symptoms are a little bit of a nuisance they are tolerable. She would like to get rid of her catheter. Her nephrologist is Dr. Hinda Lenis.  Review of systems: She denies shortness of breath. She denies chest pain.  Current Outpatient Prescriptions on File Prior to Visit  Medication Sig Dispense Refill  . acetaminophen (TYLENOL) 325 MG tablet Take 650 mg by mouth every 6 (six) hours as needed.    . GENERLAC 10 GM/15ML SOLN Take 45 mLs by mouth 2 (two) times daily.  0  . lactulose (CHRONULAC) 10 GM/15ML solution Take 45 mLs (30 g total) by mouth 2 (two) times daily. Goal of 3 bowel movements per day 240 mL 0  . midodrine (PROAMATINE) 5 MG tablet Take 5 mg by mouth 3 (three) times daily with meals. Mon/Wed/Fri (dialysis days)    . rifaximin (XIFAXAN) 550 MG TABS tablet Take 1 tablet (550 mg total) by mouth 2 (two) times daily. 42 tablet 5   No current facility-administered medications on file prior to visit.     Vitals:   07/12/17 0914  BP: 98/67  Pulse: 63  Temp: (!) 97 F (36.1 C)  SpO2: 100%  Weight: 161 lb (73 kg)  Height: 5\' 2"  (1.575 m)    Extremities: Left upper extremity no palpable radial or ulnar pulse palpable thrill in fistula audible bruit well-healed antecubital incision  Data: Patient had a duplex ultrasound of her AV fistula today. Fistula diameter is 6-7 mm.  Assessment: Maturing left basilic vein fistula needs second stage procedure. Patient has mild steal symptoms but thinks that these are tolerable.  Plan: We'll schedule for second stage procedure In the near future if patient decides to  proceed. She is going to discuss this further with her husband. She will call us soon.  Ruta Hinds, MD [facility] Office: [phone] Pager: [phone]

## 2017-07-13 DIAGNOSIS — Z992 Dependence on renal dialysis: Secondary | ICD-10-CM | POA: Diagnosis not present

## 2017-07-13 DIAGNOSIS — N186 End stage renal disease: Secondary | ICD-10-CM | POA: Diagnosis not present

## 2017-07-15 ENCOUNTER — Encounter (HOSPITAL_COMMUNITY): Payer: Self-pay | Admitting: *Deleted

## 2017-07-15 ENCOUNTER — Emergency Department (HOSPITAL_COMMUNITY): Payer: Medicare HMO

## 2017-07-15 ENCOUNTER — Inpatient Hospital Stay (HOSPITAL_COMMUNITY)
Admission: EM | Admit: 2017-07-15 | Discharge: 2017-07-18 | DRG: 432 | Disposition: A | Discharge status: Home / Self Care | Payer: Medicare HMO | Attending: Internal Medicine | Admitting: Internal Medicine

## 2017-07-15 DIAGNOSIS — D472 Monoclonal gammopathy: Secondary | ICD-10-CM | POA: Diagnosis present

## 2017-07-15 DIAGNOSIS — E876 Hypokalemia: Secondary | ICD-10-CM | POA: Diagnosis present

## 2017-07-15 DIAGNOSIS — E7220 Disorder of urea cycle metabolism, unspecified: Secondary | ICD-10-CM | POA: Diagnosis present

## 2017-07-15 DIAGNOSIS — K7040 Alcoholic hepatic failure without coma: Secondary | ICD-10-CM | POA: Diagnosis present

## 2017-07-15 DIAGNOSIS — Z9842 Cataract extraction status, left eye: Secondary | ICD-10-CM

## 2017-07-15 DIAGNOSIS — Z96651 Presence of right artificial knee joint: Secondary | ICD-10-CM | POA: Diagnosis present

## 2017-07-15 DIAGNOSIS — Z992 Dependence on renal dialysis: Secondary | ICD-10-CM

## 2017-07-15 DIAGNOSIS — K7031 Alcoholic cirrhosis of liver with ascites: Secondary | ICD-10-CM | POA: Diagnosis not present

## 2017-07-15 DIAGNOSIS — I6789 Other cerebrovascular disease: Secondary | ICD-10-CM | POA: Diagnosis not present

## 2017-07-15 DIAGNOSIS — I959 Hypotension, unspecified: Secondary | ICD-10-CM | POA: Diagnosis not present

## 2017-07-15 DIAGNOSIS — Z885 Allergy status to narcotic agent status: Secondary | ICD-10-CM

## 2017-07-15 DIAGNOSIS — N184 Chronic kidney disease, stage 4 (severe): Secondary | ICD-10-CM | POA: Diagnosis present

## 2017-07-15 DIAGNOSIS — I1311 Hypertensive heart and chronic kidney disease without heart failure, with stage 5 chronic kidney disease, or end stage renal disease: Secondary | ICD-10-CM | POA: Diagnosis present

## 2017-07-15 DIAGNOSIS — Z7682 Awaiting organ transplant status: Secondary | ICD-10-CM | POA: Diagnosis not present

## 2017-07-15 DIAGNOSIS — N186 End stage renal disease: Secondary | ICD-10-CM

## 2017-07-15 DIAGNOSIS — Z91040 Latex allergy status: Secondary | ICD-10-CM

## 2017-07-15 DIAGNOSIS — J9811 Atelectasis: Secondary | ICD-10-CM | POA: Diagnosis not present

## 2017-07-15 DIAGNOSIS — Z9221 Personal history of antineoplastic chemotherapy: Secondary | ICD-10-CM | POA: Diagnosis not present

## 2017-07-15 DIAGNOSIS — D649 Anemia, unspecified: Secondary | ICD-10-CM | POA: Diagnosis present

## 2017-07-15 DIAGNOSIS — Z9841 Cataract extraction status, right eye: Secondary | ICD-10-CM

## 2017-07-15 DIAGNOSIS — K7290 Hepatic failure, unspecified without coma: Secondary | ICD-10-CM

## 2017-07-15 DIAGNOSIS — K7030 Alcoholic cirrhosis of liver without ascites: Secondary | ICD-10-CM | POA: Diagnosis present

## 2017-07-15 DIAGNOSIS — D631 Anemia in chronic kidney disease: Secondary | ICD-10-CM | POA: Diagnosis not present

## 2017-07-15 DIAGNOSIS — K7291 Hepatic failure, unspecified with coma: Secondary | ICD-10-CM | POA: Diagnosis not present

## 2017-07-15 DIAGNOSIS — Z923 Personal history of irradiation: Secondary | ICD-10-CM

## 2017-07-15 DIAGNOSIS — K7682 Hepatic encephalopathy: Secondary | ICD-10-CM | POA: Diagnosis present

## 2017-07-15 DIAGNOSIS — Z853 Personal history of malignant neoplasm of breast: Secondary | ICD-10-CM

## 2017-07-15 DIAGNOSIS — Z79899 Other long term (current) drug therapy: Secondary | ICD-10-CM | POA: Diagnosis not present

## 2017-07-15 DIAGNOSIS — F1010 Alcohol abuse, uncomplicated: Secondary | ICD-10-CM | POA: Diagnosis present

## 2017-07-15 DIAGNOSIS — G9340 Encephalopathy, unspecified: Secondary | ICD-10-CM

## 2017-07-15 DIAGNOSIS — R4182 Altered mental status, unspecified: Secondary | ICD-10-CM | POA: Diagnosis not present

## 2017-07-15 DIAGNOSIS — F4489 Other dissociative and conversion disorders: Secondary | ICD-10-CM | POA: Diagnosis not present

## 2017-07-15 LAB — CBC WITH DIFFERENTIAL/PLATELET
Basophils Absolute: 0.1 10*3/uL (ref 0.0–0.1)
Basophils Relative: 2 %
EOS ABS: 0.1 10*3/uL (ref 0.0–0.7)
EOS PCT: 2 %
HCT: 35 % — ABNORMAL LOW (ref 36.0–46.0)
Hemoglobin: 11.9 g/dL — ABNORMAL LOW (ref 12.0–15.0)
LYMPHS ABS: 2.1 10*3/uL (ref 0.7–4.0)
LYMPHS PCT: 48 %
MCH: 28.2 pg (ref 26.0–34.0)
MCHC: 34 g/dL (ref 30.0–36.0)
MCV: 82.9 fL (ref 78.0–100.0)
Monocytes Absolute: 0.6 10*3/uL (ref 0.1–1.0)
Monocytes Relative: 15 %
Neutro Abs: 1.4 10*3/uL — ABNORMAL LOW (ref 1.7–7.7)
Neutrophils Relative %: 32 %
PLATELETS: 110 10*3/uL — AB (ref 150–400)
RBC: 4.22 MIL/uL (ref 3.87–5.11)
RDW: 14.8 % (ref 11.5–15.5)
WBC: 4.3 10*3/uL (ref 4.0–10.5)

## 2017-07-15 LAB — COMPREHENSIVE METABOLIC PANEL
ALT: 17 U/L (ref 14–54)
AST: 40 U/L (ref 15–41)
Albumin: 2.8 g/dL — ABNORMAL LOW (ref 3.5–5.0)
Alkaline Phosphatase: 221 U/L — ABNORMAL HIGH (ref 38–126)
Anion gap: 17 — ABNORMAL HIGH (ref 5–15)
BUN: 44 mg/dL — AB (ref 6–20)
CO2: 22 mmol/L (ref 22–32)
CREATININE: 6.93 mg/dL — AB (ref 0.44–1.00)
Calcium: 8.9 mg/dL (ref 8.9–10.3)
Chloride: 95 mmol/L — ABNORMAL LOW (ref 101–111)
GFR, EST AFRICAN AMERICAN: 7 mL/min — AB (ref >60)
GFR, EST NON AFRICAN AMERICAN: 6 mL/min — AB (ref >60)
Glucose, Bld: 103 mg/dL — ABNORMAL HIGH (ref 65–99)
POTASSIUM: 2.8 mmol/L — AB (ref 3.5–5.1)
Sodium: 134 mmol/L — ABNORMAL LOW (ref 135–145)
TOTAL PROTEIN: 5.7 g/dL — AB (ref 6.5–8.1)
Total Bilirubin: 1.9 mg/dL — ABNORMAL HIGH (ref 0.3–1.2)

## 2017-07-15 LAB — AMMONIA: Ammonia: 98 umol/L — ABNORMAL HIGH (ref 9–35)

## 2017-07-15 MED ORDER — STERILE WATER FOR INJECTION IJ SOLN
INTRAMUSCULAR | Status: AC
  Administered 2017-07-15: 1.2 mL via INTRAMUSCULAR
  Filled 2017-07-15: qty 10

## 2017-07-15 MED ORDER — ONDANSETRON HCL 4 MG/2ML IJ SOLN: 4.0000 mg | Freq: Four times a day (QID) | INTRAMUSCULAR | Status: DC | PRN

## 2017-07-15 MED ORDER — ZIPRASIDONE MESYLATE 20 MG IM SOLR
10.0000 mg | Freq: Once | INTRAMUSCULAR | Status: AC
  Administered 2017-07-15: 10 mg via INTRAMUSCULAR

## 2017-07-15 MED ORDER — ONDANSETRON HCL 4 MG PO TABS
4.0000 mg | ORAL_TABLET | Freq: Four times a day (QID) | ORAL | Status: DC | PRN
  Administered 2017-07-17: 4 mg via ORAL
  Filled 2017-07-15: qty 1

## 2017-07-15 MED ORDER — ZIPRASIDONE MESYLATE 20 MG IM SOLR
10.0000 mg | Freq: Once | INTRAMUSCULAR | Status: AC
  Administered 2017-07-15: 10 mg via INTRAMUSCULAR
  Filled 2017-07-15: qty 20

## 2017-07-15 MED ORDER — ZIPRASIDONE MESYLATE 20 MG IM SOLR
INTRAMUSCULAR | Status: AC
  Administered 2017-07-15: 10 mg via INTRAMUSCULAR
  Filled 2017-07-15: qty 20

## 2017-07-15 MED ORDER — ACETAMINOPHEN 650 MG RE SUPP: 650.0000 mg | Freq: Four times a day (QID) | RECTAL | Status: DC | PRN

## 2017-07-15 MED ORDER — STERILE WATER FOR INJECTION IJ SOLN
INTRAMUSCULAR | Status: AC
  Administered 2017-07-15: 1.2 mL
  Filled 2017-07-15: qty 10

## 2017-07-15 MED ORDER — STERILE WATER FOR INJECTION IJ SOLN
1.2000 mL | INTRAMUSCULAR | Status: AC
  Administered 2017-07-15: 1.2 mL via INTRAMUSCULAR

## 2017-07-15 MED ORDER — HEPARIN SODIUM (PORCINE) 5000 UNIT/ML IJ SOLN
5000.0000 [IU] | Freq: Three times a day (TID) | INTRAMUSCULAR | Status: DC
  Administered 2017-07-16 – 2017-07-17 (×5): 5000 [IU] via SUBCUTANEOUS
  Filled 2017-07-15 (×6): qty 1

## 2017-07-15 MED ORDER — SODIUM CHLORIDE 0.9% FLUSH
3.0000 mL | Freq: Two times a day (BID) | INTRAVENOUS | Status: DC
  Administered 2017-07-16 – 2017-07-18 (×5): 3 mL via INTRAVENOUS

## 2017-07-15 MED ORDER — LACTULOSE ENEMA
300.0000 mL | Freq: Once | ORAL | Status: AC
  Administered 2017-07-15: 300 mL via RECTAL
  Filled 2017-07-15: qty 300

## 2017-07-15 MED ORDER — ACETAMINOPHEN 325 MG PO TABS
650.0000 mg | ORAL_TABLET | Freq: Four times a day (QID) | ORAL | Status: DC | PRN
  Administered 2017-07-17: 650 mg via ORAL
  Filled 2017-07-15: qty 2

## 2017-07-15 MED ORDER — SODIUM CHLORIDE 0.9 % IV SOLN
INTRAVENOUS | Status: DC
  Administered 2017-07-16: 01:00:00 via INTRAVENOUS

## 2017-07-15 NOTE — ED Notes (Signed)
Pt continues to be agitated at times, , comfort measures provided

## 2017-07-15 NOTE — ED Notes (Signed)
Pt fighting and had to be physically restrained to start IV and draw blood. Verbal order per Dr Roderic Palau to hold off on enema at this time

## 2017-07-15 NOTE — ED Notes (Signed)
Made Dr. Roderic Palau aware that pt still combative, unable to start IV, draw blood, or do enema

## 2017-07-15 NOTE — ED Notes (Signed)
Contact information [place]  [phone] Larry Rueter [phone]

## 2017-07-15 NOTE — ED Triage Notes (Signed)
Pt brought in by [facility] for c/o altered loc; pt has a hx of high ammonia levels and refuses to take her medications; pt is only able to state her name and becomes combative if any staff speaks or tries to assess pt

## 2017-07-15 NOTE — ED Provider Notes (Signed)
[facility] DEPT Provider Note   CSN: [id number] Arrival date & time: 07/15/17  1926     History   Chief Complaint Chief Complaint  Patient presents with  . Altered Mental Status    HPI Savannah Jackson is a 61 y.o. female.  Patient has a history of liver failure and kidney failure. She supposedly has not been taking her lactulose and has been confused recently. Dr. Dayton Scrape her GI doctor was called by the family and told that she was not acting right. Dr. Oneida Alar called [place] and stated that her ammonia levels probably elevated this happened before   The history is provided by the EMS personnel. No language interpreter was used.  Altered Mental Status   This is a recurrent problem. The current episode started 12 to 24 hours ago. The problem has not changed since onset.Associated symptoms include confusion. Risk factors include the patient not taking medications correctly. Her past medical history does not include seizures.    Past Medical History:  Diagnosis Date  . Bilateral leg edema   . Brain aneurysm   . Breast disorder    right breast cancer 2002  . Cancer [facility])    breast cancer- lumpectromy, , chemotherapy, radiation  . Chronic back pain Diverticultis  . Chronic kidney disease (CKD) stage G3b/A3, moderately decreased glomerular filtration rate (GFR) between 30-44 mL/min/1.73 square meter and albuminuria creatinine ratio greater than 300 mg/g 08/30/2016  . Cirrhosis ([place])    alcoholic  . DDD (degenerative disc disease), lumbar   . Diverticulosis    scope 2014  . ETOH abuse   . Hepatic encephalopathy ([place]) 04/23/2017  . Hepatomegaly    scope 2014  . History of breast cancer 06/07/2016  . Hypertension   . 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 Pneumonia   . Rotator cuff syndrome of left shoulder   . Sleep apnea   . Thickened endometrium 06/19/2016   Will get endo biopsy      Patient Active Problem List   Diagnosis Date Noted  . ESRD on dialysis ([place])   . 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 S/P thoracentesis   . Acute renal failure superimposed on stage 4 chronic kidney disease ([place])   . Hypoalbuminemia 10/29/2016  . Leukocytosis 10/29/2016  . Diastolic dysfunction with chronic heart failure ([place]) 10/29/2016  . 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 Chronic kidney disease (CKD) stage G3b/A3, moderately decreased glomerular filtration rate (GFR) between 30-44 mL/min/1.73 square meter and albuminuria creatinine ratio greater than 300 mg/g 08/30/2016  . Thickened endometrium 06/19/2016  . PMB (postmenopausal bleeding) 06/07/2016  . History of breast cancer 06/07/2016  . Cough   . Esophageal varices ([place]) 12/21/2015  . Alcoholic cirrhosis of liver without ascites ([place])   . Anemia of chronic disease   . Peripheral edema 11/26/2015  . Anasarca 11/26/2015  . Metabolic acidosis 14/97/0263  . UTI (lower urinary tract infection) 09/24/2015  . Elevated troponin 09/24/2015  . Altered mental status 08/30/2015  . Hepatic encephalopathy ([place]) 08/30/2015  . Acute respiratory failure with hypoxia ([place])   . Recurrent right pleural effusion   . Alcoholic cirrhosis of liver with ascites ([place])   . Pleural effusion associated with hepatic disorder 07/03/2015  . Chronic kidney disease (CKD), stage IV (severe) ([place]) 07/03/2015  . Elevated INR 07/03/2015  . Acute respiratory distress 07/03/2015  . BRBPR (bright red blood per rectum) 04/21/2015  . Pleural effusion, right 04/21/2015  .  Sleep apnea   . Chronic back pain   . ETOH abuse   . Bilateral leg edema   . Cirrhosis, alcoholic ([place]) 95/06/3266  . Abdominal pain, left lower quadrant 02/17/2013  . Diverticulitis of colon without hemorrhage 02/17/2013  . Hypertension     Past Surgical History:  Procedure Laterality Date  . AV FISTULA PLACEMENT Left 05/22/2017    Procedure: BRACHIAL ARTERY TO BASCILIC VEIN  FISTULA CREATION;  Surgeon: Elam Dutch, MD;  Location: [facility];  Service: Vascular;  Laterality: Left;  . BRAIN SURGERY     aneurysm at [place] Right    cancer- lunmmnpectomy-   . CATARACT EXTRACTION Bilateral    [facility] 2 or 3 years ago  . CHOLECYSTECTOMY    . COLONOSCOPY N/A 04/10/2013   Procedure: COLONOSCOPY;  Surgeon: Rogene Houston, MD;  [facility];  Service: Endoscopy;  Laterality: N/A;  1030-rescheduled to 1200 Ann notified pt  . ESOPHAGOGASTRODUODENOSCOPY N/A 04/22/2015   Procedure: ESOPHAGOGASTRODUODENOSCOPY (EGD);  Surgeon: Rogene Houston, MD;  [facility];  Service: Endoscopy;  Laterality: N/A;  . IR GENERIC HISTORICAL  07/04/2016   IR RADIOLOGIST EVAL & MGMT 07/04/2016 Jacqulynn Cadet, MD [facility] INTERV RAD  . KNEE ARTHROSCOPY Left   . RADIOLOGY WITH ANESTHESIA N/A 07/16/2015   Procedure: TIPS;  Surgeon: Medication Radiologist, MD;  Location: [facility];  Service: Radiology;  Laterality: N/A;  . ROTATOR CUFF REPAIR Left   . TOTAL KNEE ARTHROPLASTY Right    right. 2002    OB History    Gravida Para Term Preterm AB Living   1 1 1     1    SAB TAB Ectopic Multiple Live Births           1       Home Medications    Prior to Admission medications   Medication Sig Start Date End Date Taking? Authorizing Provider  acetaminophen (TYLENOL) 325 MG tablet Take 650 mg by mouth every 6 (six) hours as needed.   Yes [provider]  lactulose (CHRONULAC) 10 GM/15ML solution Take 45 mLs (30 g total) by mouth 2 (two) times daily. Goal of 3 bowel movements per day 04/25/17  Yes Molt, Beth[place], DO  midodrine (PROAMATINE) 5 MG tablet Take 5 mg by mouth 3 (three) times daily with meals. Mon/Wed/Fri (dialysis days) 04/16/17  Yes [provider]  RENVELA 800 MG tablet  07/11/17  Yes [provider]  rifaximin (XIFAXAN) 550 MG TABS tablet Take 1 tablet (550 mg total) by mouth 2 (two) times  daily. 06/04/17  Yes Rehman, Mechele [facility], MD    Family History Family History  Problem Relation Age of Onset  . Aneurysm Mother   . Other Daughter        knee replacement  . Hypertension Daughter     Social History Social History  Substance Use Topics  . Smoking status: Never Smoker  . Smokeless tobacco: Never Used  . Alcohol use No     Comment: none since 03/2015     Allergies   Latex and Vicodin [hydrocodone-acetaminophen]   Review of Systems Review of Systems  Unable to perform ROS: Mental status change  Psychiatric/Behavioral: Positive for confusion.     Physical Exam Updated Vital Signs BP 124/66 (BP Location: Right Arm)   Pulse 82   Temp (!) 97.5 F (36.4 C) (Axillary)   Resp 18   Ht 5\' 2"  (1.575 m)   Wt 73 kg (161  lb)   SpO2 100%   BMI 29.45 kg/m   Physical Exam  Constitutional: She appears well-developed.  HENT:  Head: Normocephalic.  Eyes: Conjunctivae and EOM are normal. No scleral icterus.  Neck: Neck supple. No thyromegaly present.  Cardiovascular: Normal rate and regular rhythm.  Exam reveals no gallop and no friction rub.   No murmur heard. Pulmonary/Chest: No stridor. She has no wheezes. She has no rales. She exhibits no tenderness.  Abdominal: She exhibits no distension. There is no tenderness. There is no rebound.  Musculoskeletal: Normal range of motion. She exhibits no edema.  Lymphadenopathy:    She has no cervical adenopathy.  Neurological: She exhibits normal muscle tone. Coordination normal.  Patient lethargic and only oriented to person  Skin: No rash noted. No erythema.     ED Treatments / Results  Labs (all labs ordered are listed, but only abnormal results are displayed) Labs Reviewed  CBC WITH DIFFERENTIAL/PLATELET - Abnormal; Notable for the following:       Result Value   Hemoglobin 11.9 (*)    HCT 35.0 (*)    Platelets 110 (*)    Neutro Abs 1.4 (*)    All other components within normal limits  AMMONIA - Abnormal;  Notable for the following:    Ammonia 98 (*)    All other components within normal limits  COMPREHENSIVE METABOLIC PANEL - Abnormal; Notable for the following:    Sodium 134 (*)    Potassium 2.8 (*)    Chloride 95 (*)    Glucose, Bld 103 (*)    BUN 44 (*)    Creatinine, Ser 6.93 (*)    Total Protein 5.7 (*)    Albumin 2.8 (*)    Alkaline Phosphatase 221 (*)    Total Bilirubin 1.9 (*)    GFR calc non Af Amer 6 (*)    GFR calc Af Amer 7 (*)    Anion gap 17 (*)    All other components within normal limits    EKG  EKG Interpretation None       Radiology Dg Chest Portable 1 View  Result Date: 07/15/2017 CLINICAL DATA:  Altered level of consciousness, weakness EXAM: PORTABLE CHEST 1 VIEW COMPARISON:  04/23/2017 FINDINGS: Right-sided central venous catheter tip overlies the right atrium. Mild cardiomegaly with slight central congestion. Hazy atelectasis at the left base. No pneumothorax. Surgical clips right axilla. IMPRESSION: 1. Mild cardiomegaly with slight central vascular congestion 2. Hazy atelectasis at the left lung base. Electronically Signed   By: Donavan Foil M.D.   On: 07/15/2017 21:16    Procedures Procedures (including critical care time)  Medications Ordered in ED Medications  lactulose (CHRONULAC) enema 200 gm (not administered)  ziprasidone (GEODON) injection 10 mg (10 mg Intramuscular Given 07/15/17 2011)  sterile water (preservative free) injection (1.2 mLs  Given 07/15/17 2010)  ziprasidone (GEODON) injection 10 mg (10 mg Intramuscular Given 07/15/17 2110)  sterile water (preservative free) injection 1.2 mL (1.2 mLs Intramuscular Given 07/15/17 2110)     Initial Impression / Assessment and Plan / ED Course  I have reviewed the triage vital signs and the nursing notes.  Pertinent labs & imaging results that were available during my care of the patient were reviewed by me and considered in my medical decision making (see chart for details). CRITICAL  CARE Performed by: Donold Marotto L Total critical care time: 35 minutes Critical care time was exclusive of separately billable procedures and treating other patients. Critical care was necessary  to treat or prevent imminent or life-threatening deterioration. Critical care was time spent personally by me on the following activities: development of treatment plan with patient and/or surrogate as well as nursing, discussions with consultants, evaluation of patient's response to treatment, examination of patient, obtaining history from patient or surrogate, ordering and performing treatments and interventions, ordering and review of laboratory studies, ordering and review of radiographic studies, pulse oximetry and re-evaluation of patient's condition.     Patient with elevated ammonia level from her liver failure. She will be admitted to medicine with GI consult tomorrow and given rectal lactulose  Final Clinical Impressions(s) / ED Diagnoses   Final diagnoses:  None    New Prescriptions New Prescriptions   No medications on file     Milton Ferguson, MD 07/15/17 2247

## 2017-07-16 ENCOUNTER — Observation Stay (HOSPITAL_COMMUNITY): Payer: Medicare HMO

## 2017-07-16 DIAGNOSIS — R4182 Altered mental status, unspecified: Secondary | ICD-10-CM | POA: Diagnosis not present

## 2017-07-16 DIAGNOSIS — G9340 Encephalopathy, unspecified: Secondary | ICD-10-CM | POA: Diagnosis not present

## 2017-07-16 DIAGNOSIS — K7290 Hepatic failure, unspecified without coma: Secondary | ICD-10-CM

## 2017-07-16 DIAGNOSIS — K7031 Alcoholic cirrhosis of liver with ascites: Secondary | ICD-10-CM

## 2017-07-16 DIAGNOSIS — F1010 Alcohol abuse, uncomplicated: Secondary | ICD-10-CM

## 2017-07-16 LAB — RENAL FUNCTION PANEL
ALBUMIN: 2.6 g/dL — AB (ref 3.5–5.0)
ANION GAP: 16 — AB (ref 5–15)
BUN: 48 mg/dL — AB (ref 6–20)
CO2: 24 mmol/L (ref 22–32)
Calcium: 9.5 mg/dL (ref 8.9–10.3)
Chloride: 95 mmol/L — ABNORMAL LOW (ref 101–111)
Creatinine, Ser: 7.08 mg/dL — ABNORMAL HIGH (ref 0.44–1.00)
GFR, EST AFRICAN AMERICAN: 6 mL/min — AB (ref >60)
GFR, EST NON AFRICAN AMERICAN: 6 mL/min — AB (ref >60)
Glucose, Bld: 97 mg/dL (ref 65–99)
PHOSPHORUS: 9 mg/dL — AB (ref 2.5–4.6)
POTASSIUM: 4.2 mmol/L (ref 3.5–5.1)
Sodium: 135 mmol/L (ref 135–145)

## 2017-07-16 LAB — CBC
HCT: 36.1 % (ref 36.0–46.0)
HEMATOCRIT: 36.3 % (ref 36.0–46.0)
Hemoglobin: 11.9 g/dL — ABNORMAL LOW (ref 12.0–15.0)
Hemoglobin: 12 g/dL (ref 12.0–15.0)
MCH: 28.1 pg (ref 26.0–34.0)
MCH: 27.8 pg (ref 26.0–34.0)
MCHC: 33 g/dL (ref 30.0–36.0)
MCHC: 33.1 g/dL (ref 30.0–36.0)
MCV: 85.1 fL (ref 78.0–100.0)
MCV: 84.2 fL (ref 78.0–100.0)
PLATELETS: 117 10*3/uL — AB (ref 150–400)
Platelets: 119 10*3/uL — ABNORMAL LOW (ref 150–400)
RBC: 4.24 MIL/uL (ref 3.87–5.11)
RBC: 4.31 MIL/uL (ref 3.87–5.11)
RDW: 14.9 % (ref 11.5–15.5)
RDW: 14.8 % (ref 11.5–15.5)
WBC: 3.4 10*3/uL — ABNORMAL LOW (ref 4.0–10.5)
WBC: 3.6 10*3/uL — AB (ref 4.0–10.5)

## 2017-07-16 LAB — BASIC METABOLIC PANEL
Anion gap: 19 — ABNORMAL HIGH (ref 5–15)
BUN: 45 mg/dL — ABNORMAL HIGH (ref 6–20)
CHLORIDE: 97 mmol/L — AB (ref 101–111)
CO2: 19 mmol/L — AB (ref 22–32)
CREATININE: 6.61 mg/dL — AB (ref 0.44–1.00)
Calcium: 9.2 mg/dL (ref 8.9–10.3)
GFR calc non Af Amer: 6 mL/min — ABNORMAL LOW (ref >60)
GFR, EST AFRICAN AMERICAN: 7 mL/min — AB (ref >60)
GLUCOSE: 139 mg/dL — AB (ref 65–99)
Potassium: 3.5 mmol/L (ref 3.5–5.1)
Sodium: 135 mmol/L (ref 135–145)

## 2017-07-16 LAB — MRSA PCR SCREENING: MRSA by PCR: NEGATIVE

## 2017-07-16 LAB — PHOSPHORUS: Phosphorus: 7 mg/dL — ABNORMAL HIGH (ref 2.5–4.6)

## 2017-07-16 LAB — MAGNESIUM: Magnesium: 2.6 mg/dL — ABNORMAL HIGH (ref 1.7–2.4)

## 2017-07-16 LAB — APTT: APTT: 43 s — AB (ref 24–36)

## 2017-07-16 LAB — PROTIME-INR
INR: 1.39
Prothrombin Time: 16.9 seconds — ABNORMAL HIGH (ref 11.4–15.2)

## 2017-07-16 MED ORDER — LIDOCAINE HCL (PF) 1 % IJ SOLN: 5.0000 mL | INTRAMUSCULAR | Status: DC | PRN

## 2017-07-16 MED ORDER — LORAZEPAM 2 MG/ML IJ SOLN
2.0000 mg | Freq: Four times a day (QID) | INTRAMUSCULAR | Status: DC | PRN
  Administered 2017-07-16: 2 mg via INTRAVENOUS
  Filled 2017-07-16: qty 1

## 2017-07-16 MED ORDER — VITAMIN B-1 100 MG PO TABS
100.0000 mg | ORAL_TABLET | Freq: Every day | ORAL | Status: DC
  Administered 2017-07-18: 100 mg via ORAL
  Filled 2017-07-16 (×2): qty 1

## 2017-07-16 MED ORDER — HEPARIN SODIUM (PORCINE) 1000 UNIT/ML DIALYSIS
1000.0000 [IU] | INTRAMUSCULAR | Status: DC | PRN
  Filled 2017-07-16: qty 1

## 2017-07-16 MED ORDER — LIDOCAINE-PRILOCAINE 2.5-2.5 % EX CREA
1.0000 "application " | TOPICAL_CREAM | CUTANEOUS | Status: DC | PRN
  Filled 2017-07-16: qty 5

## 2017-07-16 MED ORDER — SODIUM CHLORIDE 0.9 % IV SOLN: 100.0000 mL | INTRAVENOUS | Status: DC | PRN

## 2017-07-16 MED ORDER — LORAZEPAM 2 MG/ML IJ SOLN
2.0000 mg | Freq: Once | INTRAMUSCULAR | Status: AC
  Administered 2017-07-16: 2 mg via INTRAVENOUS

## 2017-07-16 MED ORDER — ALTEPLASE 2 MG IJ SOLR
2.0000 mg | Freq: Once | INTRAMUSCULAR | Status: DC | PRN
  Filled 2017-07-16: qty 2

## 2017-07-16 MED ORDER — POTASSIUM CHLORIDE 10 MEQ/100ML IV SOLN
10.0000 meq | INTRAVENOUS | Status: AC
  Administered 2017-07-16 (×5): 10 meq via INTRAVENOUS
  Filled 2017-07-16 (×5): qty 100

## 2017-07-16 MED ORDER — LORAZEPAM 2 MG/ML IJ SOLN
INTRAMUSCULAR | Status: AC
  Filled 2017-07-16: qty 1

## 2017-07-16 MED ORDER — LACTULOSE ENEMA
300.0000 mL | Freq: Four times a day (QID) | ORAL | Status: DC
  Administered 2017-07-16 – 2017-07-17 (×6): 300 mL via RECTAL
  Filled 2017-07-16 (×10): qty 300

## 2017-07-16 MED ORDER — LORAZEPAM 1 MG PO TABS
1.0000 mg | ORAL_TABLET | Freq: Four times a day (QID) | ORAL | Status: DC | PRN
Start: 2017-07-16 — End: 2017-07-18

## 2017-07-16 MED ORDER — THIAMINE HCL 100 MG/ML IJ SOLN
100.0000 mg | Freq: Every day | INTRAMUSCULAR | Status: DC
  Administered 2017-07-16 – 2017-07-17 (×2): 100 mg via INTRAVENOUS
  Filled 2017-07-16 (×2): qty 2

## 2017-07-16 MED ORDER — PENTAFLUOROPROP-TETRAFLUOROETH EX AERO
1.0000 "application " | INHALATION_SPRAY | CUTANEOUS | Status: DC | PRN
  Filled 2017-07-16: qty 30

## 2017-07-16 MED ORDER — FOLIC ACID 1 MG PO TABS
1.0000 mg | ORAL_TABLET | Freq: Every day | ORAL | Status: DC
  Administered 2017-07-17 – 2017-07-18 (×2): 1 mg via ORAL
  Filled 2017-07-16 (×2): qty 1

## 2017-07-16 MED ORDER — ADULT MULTIVITAMIN W/MINERALS CH
1.0000 | ORAL_TABLET | Freq: Every day | ORAL | Status: DC
  Administered 2017-07-17 – 2017-07-18 (×2): 1 via ORAL
  Filled 2017-07-16: qty 1

## 2017-07-16 NOTE — H&P (Addendum)
[facility] History and Physical  TAURIEL SCRONCE HQI:[id number] DOB: 16-Feb-1956 DOA: 07/15/2017  Referring physician:  PCP: Rosita Fire, MD   Chief Complaint: "She wasn't acting right."  HPI: Savannah Jackson is a 61 y.o. female with past medical history significant for breast cancer, end-stage renal disease, alcohol abuse history, recurrent hepatic encephalopathy presents emergency room with confusion. Per the patient's daughter who gives most of the history. Patient has been having trouble since the formulation of her lactulose was changed. She was previously admitted after this. And over the last week or so has gradually become more confused. Family contacted the patient's gastroenterologist who advised EDP evaluation.  ED course: Emergency room patient's motor levels found be elevated. She was highly combative. Needed Geodon times to 10 mg to calm her. Hospitalist consulted for admission.   Review of Systems:  As per HPI otherwise 10 point review of systems negative.    Past Medical History:  Diagnosis Date  . Bilateral leg edema   . Brain aneurysm   . Breast disorder    right breast cancer 2002  . Cancer [facility])    breast cancer- lumpectromy, , chemotherapy, radiation  . Chronic back pain Diverticultis  . Chronic kidney disease (CKD) stage G3b/A3, moderately decreased glomerular filtration rate (GFR) between 30-44 mL/min/1.73 square meter and albuminuria creatinine ratio greater than 300 mg/g 08/30/2016  . 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 DDD (degenerative disc disease), lumbar   . Diverticulosis    scope 2014  . ETOH abuse   . Hepatic encephalopathy ([place]) 04/23/2017  . Hepatomegaly    scope 2014  . History of breast cancer 06/07/2016  . Hypertension   . 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 Pneumonia   . Rotator cuff syndrome of left shoulder   . Sleep apnea   .  Thickened endometrium 06/19/2016   Will get endo biopsy    Past Surgical History:  Procedure Laterality Date  . AV FISTULA PLACEMENT Left 05/22/2017   Procedure: BRACHIAL ARTERY TO BASCILIC VEIN  FISTULA CREATION;  Surgeon: Elam Dutch, MD;  Location: [place];  Service: Vascular;  Laterality: Left;  . BRAIN SURGERY     aneurysm at [place] Right    cancer- lunmmnpectomy-   . CATARACT EXTRACTION Bilateral    [facility] 2 or 3 years ago  . CHOLECYSTECTOMY    . COLONOSCOPY N/A 04/10/2013   Procedure: COLONOSCOPY;  Surgeon: Rogene Houston, MD;  [facility];  Service: Endoscopy;  Laterality: N/A;  1030-rescheduled to 1200 Ann notified pt  . ESOPHAGOGASTRODUODENOSCOPY N/A 04/22/2015   Procedure: ESOPHAGOGASTRODUODENOSCOPY (EGD);  Surgeon: Rogene Houston, MD;  [facility];  Service: Endoscopy;  Laterality: N/A;  . IR GENERIC HISTORICAL  07/04/2016   IR RADIOLOGIST EVAL & MGMT 07/04/2016 Jacqulynn Cadet, MD [facility] INTERV RAD  . KNEE ARTHROSCOPY Left   . RADIOLOGY WITH ANESTHESIA N/A 07/16/2015   Procedure: TIPS;  Surgeon: Medication Radiologist, MD;  Location: [place];  Service: Radiology;  Laterality: N/A;  . ROTATOR CUFF REPAIR Left   . TOTAL KNEE ARTHROPLASTY Right    right. 2002   Social History:  reports that she has never smoked. She has never used smokeless tobacco. She reports that she does not drink alcohol or use drugs.  Allergies  Allergen Reactions  . Latex Swelling    Discoloration of skin.   . Vicodin [Hydrocodone-Acetaminophen] Itching  Family History  Problem Relation Age of Onset  . Aneurysm Mother   . Other Daughter        knee replacement  . Hypertension Daughter      Prior to Admission medications   Medication Sig Start Date End Date Taking? Authorizing Provider  acetaminophen (TYLENOL) 325 MG tablet Take 650 mg by mouth every 6 (six) hours as needed.   Yes [provider]  lactulose (CHRONULAC) 10 GM/15ML solution  Take 45 mLs (30 g total) by mouth 2 (two) times daily. Goal of 3 bowel movements per day 04/25/17  Yes Molt, Bethany, DO  midodrine (PROAMATINE) 5 MG tablet Take 5 mg by mouth 3 (three) times daily with meals. Mon/Wed/Fri (dialysis days) 04/16/17  Yes [provider]  RENVELA 800 MG tablet  07/11/17  Yes [provider]  rifaximin (XIFAXAN) 550 MG TABS tablet Take 1 tablet (550 mg total) by mouth 2 (two) times daily. 06/04/17  Yes Rogene Houston, MD   Physical Exam: Vitals:   07/15/17 1927 07/15/17 1930 07/15/17 2222 07/15/17 2338  BP:  122/85 124/66 137/78  Pulse:  74 82 88  Resp:  16 18 20   Temp:  (!) 97.5 F (36.4 C)    TempSrc:  Axillary    SpO2:  100% 100% 100%  Weight: 73 kg (161 lb)     Height: 5\' 2"  (1.575 m)       Wt Readings from Last 3 Encounters:  07/15/17 73 kg (161 lb)  07/12/17 73 kg (161 lb)  05/22/17 71.7 kg (158 lb)    General:  Sedated, easily aroused but becomes combative Eyes:  PERRL, EOMI, normal lids, iris ENT:  grossly normal hearing, lips & tongue Neck:  no LAD, masses or thyromegaly Cardiovascular:  RRR, no m/r/g. No LE edema.  Respiratory:  CTA bilaterally, no w/r/r. Normal respiratory effort. Abdomen:  soft, ntnd Skin:  no rash or induration seen on limited exam Musculoskeletal:  grossly normal tone BUE/BLE Psychiatric:  grossly normal mood and affect, speech fluent and appropriate Neurologic:  Limited exam due to sedation, moves all extremities in coordinated fashion.          Labs on Admission:  Basic Metabolic Panel:  Recent Labs Lab 07/15/17 2205  NA 134*  K 2.8*  CL 95*  CO2 22  GLUCOSE 103*  BUN 44*  CREATININE 6.93*  CALCIUM 8.9   Liver Function Tests:  Recent Labs Lab 07/15/17 2205  AST 40  ALT 17  ALKPHOS 221*  BILITOT 1.9*  PROT 5.7*  ALBUMIN 2.8*   No results for input(s): LIPASE, AMYLASE in the last 168 hours.  Recent Labs Lab 07/15/17 2205  AMMONIA 98*   CBC:  Recent Labs Lab  07/15/17 2205  WBC 4.3  NEUTROABS 1.4*  HGB 11.9*  HCT 35.0*  MCV 82.9  PLT 110*   Cardiac Enzymes: No results for input(s): CKTOTAL, CKMB, CKMBINDEX, TROPONINI in the last 168 hours.  BNP (last 3 results) No results for input(s): BNP in the last 8760 hours.  ProBNP (last 3 results) No results for input(s): PROBNP in the last 8760 hours.   Serum creatinine: 6.93 mg/dL (H) 07/15/17 2205 Estimated creatinine clearance: 8 mL/min (A)  CBG: No results for input(s): GLUCAP in the last 168 hours.  Radiological Exams on Admission: Dg Chest Portable 1 View  Result Date: 07/15/2017 CLINICAL DATA:  Altered level of consciousness, weakness EXAM: PORTABLE CHEST 1 VIEW COMPARISON:  04/23/2017 FINDINGS: Right-sided central venous catheter tip overlies  the right atrium. Mild cardiomegaly with slight central congestion. Hazy atelectasis at the left base. No pneumothorax. Surgical clips right axilla. IMPRESSION: 1. Mild cardiomegaly with slight central vascular congestion 2. Hazy atelectasis at the left lung base. Electronically Signed   By: Donavan Foil M.D.   On: 07/15/2017 21:16    EKG: no new  Assessment/Plan Active Problems:   Hyperammonemia (HCC)   Eleavted Ammonia PR lactulose q6 Holding oral rifaximin   Acute encephalopathy Head CT Check UA CXR neg  ETOH Abuse Hx CIWA Protocol  ESRD Neprho consult placed, call in AM Midodrine held Renvela held  Low K IV K, replete and recheck Check Mg and Phos  Code Status: FULL  DVT Prophylaxis: heparin Family Communication: Casimiro Needle (dgrt) [phone] Disposition Plan: Pending Improvement  Status: sdu obs  Elwin Mocha, MD Family Medicine [facility] [email or web address] Password [email or web address]

## 2017-07-16 NOTE — Plan of Care (Signed)
Problem: Safety: Goal: Ability to remain free from injury will improve Outcome: Progressing Side rails up, bed in low position, call bell and needed personal items within reach. Bed alarm on

## 2017-07-16 NOTE — Consult Note (Signed)
Reason for Consult: End-stage renal disease Referring Physician: Dr.Tat  Savannah Jackson is an 61 y.o. female.  HPI: She is a patient with history of liver cirrhosis, breast CA, end-stage renal disease on maintenance hemodialysis presently came because of altered mental status. When she was evaluated she was found to have high-grade ammonia level and very combative. The patient presently somnolent and unable to get any additional information. Consult is called for end-stage renal disease.  Past Medical History:  Diagnosis Date  . Bilateral leg edema   . Brain aneurysm   . Breast disorder    right breast cancer 2002  . Cancer [facility])    breast cancer- lumpectromy, , chemotherapy, radiation  . Chronic back pain Diverticultis  . Chronic kidney disease (CKD) stage G3b/A3, moderately decreased glomerular filtration rate (GFR) between 30-44 mL/min/1.73 square meter and albuminuria creatinine ratio greater than 300 mg/g 08/30/2016  . 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 DDD (degenerative disc disease), lumbar   . Diverticulosis    scope 2014  . ETOH abuse   . Hepatic encephalopathy ([place]) 04/23/2017  . Hepatomegaly    scope 2014  . History of breast cancer 06/07/2016  . Hypertension   . 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 Pneumonia   . Rotator cuff syndrome of left shoulder   . Sleep apnea   . Thickened endometrium 06/19/2016   Will get endo biopsy     Past Surgical History:  Procedure Laterality Date  . AV FISTULA PLACEMENT Left 05/22/2017   Procedure: BRACHIAL ARTERY TO BASCILIC VEIN  FISTULA CREATION;  Surgeon: Elam Dutch, MD;  Location: [place];  Service: Vascular;  Laterality: Left;  . BRAIN SURGERY     aneurysm at [place] Right    cancer- lunmmnpectomy-   . CATARACT EXTRACTION Bilateral    [facility] 2 or 3 years ago  . CHOLECYSTECTOMY    . COLONOSCOPY N/A 04/10/2013   Procedure: COLONOSCOPY;  Surgeon: Rogene Houston, MD;  [facility];  Service: Endoscopy;  Laterality: N/A;  1030-rescheduled to 1200 Ann notified pt  . ESOPHAGOGASTRODUODENOSCOPY N/A 04/22/2015   Procedure: ESOPHAGOGASTRODUODENOSCOPY (EGD);  Surgeon: Rogene Houston, MD;  [facility];  Service: Endoscopy;  Laterality: N/A;  . IR GENERIC HISTORICAL  07/04/2016   IR RADIOLOGIST EVAL & MGMT 07/04/2016 Jacqulynn Cadet, MD [facility] INTERV RAD  . KNEE ARTHROSCOPY Left   . RADIOLOGY WITH ANESTHESIA N/A 07/16/2015   Procedure: TIPS;  Surgeon: Medication Radiologist, MD;  Location: [place];  Service: Radiology;  Laterality: N/A;  . ROTATOR CUFF REPAIR Left   . TOTAL KNEE ARTHROPLASTY Right    right. 2002    Family History  Problem Relation Age of Onset  . Aneurysm Mother   . Other Daughter        knee replacement  . Hypertension Daughter     Social History:  reports that she has never smoked. She has never used smokeless tobacco. She reports that she does not drink alcohol or use drugs.  Allergies:  Allergies  Allergen Reactions  . Latex Swelling    Discoloration of skin.   . Vicodin [Hydrocodone-Acetaminophen] Itching    Medications: I have reviewed the patient's current medications.  Results for orders placed or performed during the hospital encounter of 07/15/17 (from the past 48 hour(s))  CBC with Differential/Platelet     Status: Abnormal   Collection Time: 07/15/17  10:05 PM  Result Value Ref Range   WBC 4.3 4.0 - 10.5 K/uL   RBC 4.22 3.87 - 5.11 MIL/uL   Hemoglobin 11.9 (L) 12.0 - 15.0 g/dL   HCT 35.0 (L) 36.0 - 46.0 %   MCV 82.9 78.0 - 100.0 fL   MCH 28.2 26.0 - 34.0 pg   MCHC 34.0 30.0 - 36.0 g/dL   RDW 14.8 11.5 - 15.5 %   Platelets 110 (L) 150 - 400 K/uL    Comment: SPECIMEN CHECKED FOR CLOTS   Neutrophils Relative % 32 %   Neutro Abs 1.4 (L) 1.7 - 7.7 K/uL   Lymphocytes Relative 48 %   Lymphs Abs 2.1 0.7 - 4.0 K/uL   Monocytes Relative 15 %    Monocytes Absolute 0.6 0.1 - 1.0 K/uL   Eosinophils Relative 2 %   Eosinophils Absolute 0.1 0.0 - 0.7 K/uL   Basophils Relative 2 %   Basophils Absolute 0.1 0.0 - 0.1 K/uL  Ammonia     Status: Abnormal   Collection Time: 07/15/17 10:05 PM  Result Value Ref Range   Ammonia 98 (H) 9 - 35 umol/L  Comprehensive metabolic panel     Status: Abnormal   Collection Time: 07/15/17 10:05 PM  Result Value Ref Range   Sodium 134 (L) 135 - 145 mmol/L   Potassium 2.8 (L) 3.5 - 5.1 mmol/L   Chloride 95 (L) 101 - 111 mmol/L   CO2 22 22 - 32 mmol/L   Glucose, Bld 103 (H) 65 - 99 mg/dL   BUN 44 (H) 6 - 20 mg/dL   Creatinine, Ser 6.93 (H) 0.44 - 1.00 mg/dL   Calcium 8.9 8.9 - 10.3 mg/dL   Total Protein 5.7 (L) 6.5 - 8.1 g/dL   Albumin 2.8 (L) 3.5 - 5.0 g/dL   AST 40 15 - 41 U/L   ALT 17 14 - 54 U/L   Alkaline Phosphatase 221 (H) 38 - 126 U/L   Total Bilirubin 1.9 (H) 0.3 - 1.2 mg/dL   GFR calc non Af Amer 6 (L) >60 mL/min   GFR calc Af Amer 7 (L) >60 mL/min    Comment: (NOTE) The eGFR has been calculated using the CKD EPI equation. This calculation has not been validated in all clinical situations. eGFR's persistently <60 mL/min signify possible Chronic Kidney Disease.    Anion gap 17 (H) 5 - 15  Magnesium     Status: Abnormal   Collection Time: 07/15/17 10:05 PM  Result Value Ref Range   Magnesium 2.6 (H) 1.7 - 2.4 mg/dL  Phosphorus     Status: Abnormal   Collection Time: 07/15/17 10:05 PM  Result Value Ref Range   Phosphorus 7.0 (H) 2.5 - 4.6 mg/dL  Basic metabolic panel     Status: Abnormal   Collection Time: 07/16/17  5:45 AM  Result Value Ref Range   Sodium 135 135 - 145 mmol/L   Potassium 3.5 3.5 - 5.1 mmol/L    Comment: DELTA CHECK NOTED   Chloride 97 (L) 101 - 111 mmol/L   CO2 19 (L) 22 - 32 mmol/L   Glucose, Bld 139 (H) 65 - 99 mg/dL   BUN 45 (H) 6 - 20 mg/dL   Creatinine, Ser 6.61 (H) 0.44 - 1.00 mg/dL   Calcium 9.2 8.9 - 10.3 mg/dL   GFR calc non Af Amer 6 (L) >60 mL/min    GFR calc Af Amer 7 (L) >60 mL/min    Comment: (NOTE) The eGFR has  been calculated using the CKD EPI equation. This calculation has not been validated in all clinical situations. eGFR's persistently <60 mL/min signify possible Chronic Kidney Disease.    Anion gap 19 (H) 5 - 15  CBC     Status: Abnormal   Collection Time: 07/16/17  5:45 AM  Result Value Ref Range   WBC 3.4 (L) 4.0 - 10.5 K/uL   RBC 4.24 3.87 - 5.11 MIL/uL   Hemoglobin 11.9 (L) 12.0 - 15.0 g/dL   HCT 36.1 36.0 - 46.0 %   MCV 85.1 78.0 - 100.0 fL   MCH 28.1 26.0 - 34.0 pg   MCHC 33.0 30.0 - 36.0 g/dL   RDW 14.9 11.5 - 15.5 %   Platelets 117 (L) 150 - 400 K/uL    Comment: CONSISTENT WITH PREVIOUS RESULT    Ct Head Wo Contrast  Result Date: 07/16/2017 CLINICAL DATA:  Altered mental status EXAM: CT HEAD WITHOUT CONTRAST TECHNIQUE: Contiguous axial images were obtained from the base of the skull through the vertex without intravenous contrast. COMPARISON:  09/24/2015 FINDINGS: Brain: Study limited by patient motion. No gross territorial infarct, hemorrhage or intracranial mass is seen. Scattered hypodensities within the bilateral white matter consistent small vessel ischemic change. Stable ventricle size. Vascular: No hyperdense vessels. Aneurysm clipping artifact at the sella. Skull: No fracture. Status post right frontal orbital craniotomy changes. Mild soft tissue thickening over the craniotomy site. Sinuses/Orbits: No acute finding. Other: None IMPRESSION: 1. Motion degraded study 2. No definite CT evidence for acute intracranial abnormality. Small vessel ischemic changes of the white matter 3. Stable right frontal orbital craniotomy changes. Slight increased soft tissue thickening over the craniotomy site, correlate with physical exam. Electronically Signed   By: Donavan Foil M.D.   On: 07/16/2017 00:51   Dg Chest Portable 1 View  Result Date: 07/15/2017 CLINICAL DATA:  Altered level of consciousness, weakness EXAM:  PORTABLE CHEST 1 VIEW COMPARISON:  04/23/2017 FINDINGS: Right-sided central venous catheter tip overlies the right atrium. Mild cardiomegaly with slight central congestion. Hazy atelectasis at the left base. No pneumothorax. Surgical clips right axilla. IMPRESSION: 1. Mild cardiomegaly with slight central vascular congestion 2. Hazy atelectasis at the left lung base. Electronically Signed   By: Donavan Foil M.D.   On: 07/15/2017 21:16    Review of Systems  Unable to perform ROS: Mental status change   Blood pressure (!) 124/107, pulse 79, temperature (!) 97.5 F (36.4 C), temperature source Axillary, resp. rate (!) 24, height '5\' 6"'$  (1.676 m), weight 75.8 kg (167 lb 1.7 oz), SpO2 100 %. Physical Exam  Constitutional: No distress.  Eyes: No scleral icterus.  Neck: No JVD present.  Cardiovascular: Normal rate and regular rhythm.   Respiratory: No respiratory distress. She has no wheezes.  GI: She exhibits no distension. There is no tenderness.  Musculoskeletal: She exhibits no edema.  Neurological:  Patient very somnolent barely arousable     Assessment/Plan: 1] altered: status: Most likely from hepatic encephalopathy. Presently patient is very sleepy and difficult to wake her up. Patient also has received some sedative because of her combative nature when she came. 2] end-stage renal disease: She is status post hemodialysis on Saturday. She is due for dialysis tomorrow 3] hypokalemia: Patient has received potassium and her potassium has improved 4]history of hypotension: Mostly intradialytic. Patient has been on Lipitor during as an outpatient. 5]bone and and minral disorder: Her calcium is range but phosphorus is high. Most likely because of her diet and not  taking her binder. 6] anemia: Her hemoglobin is within TARGET goal. 7] history of breast CA Plan: Patient has this moment doesn't require dialysis will make arrangements for tomorrow. 2] once patient is able to eat we'll start her on  her binder. 3] we'll check renal panel and CBC in the morning.  Miche Loughridge S 07/16/2017, 7:36 AM

## 2017-07-16 NOTE — Progress Notes (Signed)
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 Apparently, the patient has had worsening confusion over the period for the past week. There was some concern whether the patient was taking her lactulose. In addition, there is also concern as the patient received a new brand of lactulose from her pharmacy. Her daughter states that when this happens, the patient becomes encephalopathic which occurred during an admission in June 2018. There's been no complaints of fever, chest pain, abdominal pain, shortness breath, medications, melena. Upon presentation, the patient was afebrile hemodynamic stable. Ammonia was noted to be 98. The patient was started on lactulose enemas.  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  - Treat hepatic encephalopathy as above   ESRD -Renal consulted by ED for dialysis -last HD 07/14/17  IgG lambda monoclonal gammopathy: Follows with heme/onc at [facility]. Prior IFE showed only IgG lambda monoclonal protein x 2. Renal biopsy showed no monoclonal protein deposition.    Disposition Plan:   Home when stable  Family Communication:   No Family at  bedside  Consultants:  renal  Code Status:  FULL  DVT Prophylaxis:  [place] Heparin    Procedures: As Listed in Progress Note Above  Antibiotics: None    Subjective: Patient is encephalopathic. Review of systems unobtainable. The patient moans and occasionally shouts incomprehensibly with tactile stimulation. No reports respiratory distress, vomiting, seizure activity.  Objective: Vitals:   07/16/17 0500 07/16/17 0600 07/16/17 0700 07/16/17 0800  BP: 115/77 (!) 124/107 114/76 110/74  Pulse: 87 79 71 69  Resp:      Temp:  (!) 97.5 F (36.4 C)  (!) 97.4 F (36.3 C)  TempSrc:  Axillary  Axillary  SpO2: 100% 100% 100% 100%  Weight:      Height:        Intake/Output Summary (Last 24 hours) at 07/16/17 0901 Last data filed at 07/16/17 0615  Gross per 24 hour  Intake           959.17 ml  Output              300 ml  Net           659.17 ml   Weight change:  Exam:   General:  Pt is Somnolent, but arouses with tactile stimuli  HEENT: No icterus, No thrush, No neck mass, [place]/AT  Cardiovascular: RRR, S1/S2, no rubs, no gallops  Respiratory: CTA bilaterally, no wheezing, no crackles, no rhonchi  Abdomen: Soft/+BS, non tender, non distended, no guarding  Extremities: No edema, No lymphangitis, No petechiae, No rashes, no synovitis   Data Reviewed: I have personally reviewed following labs and imaging studies Basic Metabolic Panel:  Recent Labs Lab 07/15/17 2205 07/16/17 0545  NA  134* 135  K 2.8* 3.5  CL 95* 97*  CO2 22 19*  GLUCOSE 103* 139*  BUN 44* 45*  CREATININE 6.93* 6.61*  CALCIUM 8.9 9.2  MG 2.6*  --   PHOS 7.0*  --    Liver Function Tests:  Recent Labs Lab 07/15/17 2205  AST 40  ALT 17  ALKPHOS 221*  BILITOT 1.9*  PROT 5.7*  ALBUMIN 2.8*   No results for input(s): LIPASE, AMYLASE in the last 168 hours.  Recent Labs Lab 07/15/17 2205  AMMONIA 98*   Coagulation Profile: No results for input(s): INR, PROTIME in the last 168  hours. CBC:  Recent Labs Lab 07/15/17 2205 07/16/17 0545  WBC 4.3 3.4*  NEUTROABS 1.4*  --   HGB 11.9* 11.9*  HCT 35.0* 36.1  MCV 82.9 85.1  PLT 110* 117*   Cardiac Enzymes: No results for input(s): CKTOTAL, CKMB, CKMBINDEX, TROPONINI in the last 168 hours. BNP: Invalid input(s): POCBNP CBG: No results for input(s): GLUCAP in the last 168 hours. HbA1C: No results for input(s): HGBA1C in the last 72 hours. Urine analysis:    Component Value Date/Time   COLORURINE YELLOW 10/19/2015 1656   APPEARANCEUR CLEAR 10/19/2015 1656   LABSPEC 1.010 10/19/2015 1656   PHURINE 5.5 10/19/2015 1656   GLUCOSEU NEGATIVE 10/19/2015 1656   HGBUR SMALL (A) 10/19/2015 1656   BILIRUBINUR NEGATIVE 10/19/2015 1656   KETONESUR NEGATIVE 10/19/2015 1656   PROTEINUR TRACE (A) 10/19/2015 1656   UROBILINOGEN 0.2 08/30/2015 1642   NITRITE NEGATIVE 10/19/2015 1656   LEUKOCYTESUR NEGATIVE 10/19/2015 1656   Sepsis Labs: @LABRCNTIP (procalcitonin:4,lacticidven:4) )No results found for this or any previous visit (from the past 240 hour(s)).   Scheduled Meds: . folic acid  1 mg Oral Daily  . heparin  5,000 Units Subcutaneous Q8H  . lactulose  300 mL Rectal Q6H  . multivitamin with minerals  1 tablet Oral Daily  . sodium chloride flush  3 mL Intravenous Q12H  . thiamine  100 mg Oral Daily   Or  . thiamine  100 mg Intravenous Daily   Continuous Infusions: . sodium chloride 50 mL/hr at 07/16/17 0104    Procedures/Studies: Ct Head Wo Contrast  Result Date: 07/16/2017 CLINICAL DATA:  Altered mental status EXAM: CT HEAD WITHOUT CONTRAST TECHNIQUE: Contiguous axial images were obtained from the base of the skull through the vertex without intravenous contrast. COMPARISON:  09/24/2015 FINDINGS: Brain: Study limited by patient motion. No gross territorial infarct, hemorrhage or intracranial mass is seen. Scattered hypodensities within the bilateral white matter consistent small vessel ischemic change.  Stable ventricle size. Vascular: No hyperdense vessels. Aneurysm clipping artifact at the sella. Skull: No fracture. Status post right frontal orbital craniotomy changes. Mild soft tissue thickening over the craniotomy site. Sinuses/Orbits: No acute finding. Other: None IMPRESSION: 1. Motion degraded study 2. No definite CT evidence for acute intracranial abnormality. Small vessel ischemic changes of the white matter 3. Stable right frontal orbital craniotomy changes. Slight increased soft tissue thickening over the craniotomy site, correlate with physical exam. Electronically Signed   By: Donavan Foil M.D.   On: 07/16/2017 00:51   Dg Chest Portable 1 View  Result Date: 07/15/2017 CLINICAL DATA:  Altered level of consciousness, weakness EXAM: PORTABLE CHEST 1 VIEW COMPARISON:  04/23/2017 FINDINGS: Right-sided central venous catheter tip overlies the right atrium. Mild cardiomegaly with slight central congestion. Hazy atelectasis at the left base. No pneumothorax. Surgical clips right axilla. IMPRESSION: 1. Mild cardiomegaly with slight central vascular congestion 2. Hazy  atelectasis at the left lung base. Electronically Signed   By: Donavan Foil M.D.   On: 07/15/2017 21:16    Catelin Manthe, DO  [facility] Pager [phone]  If 7PM-7AM, please contact night-coverage [email or web address] Password [email or web address] 07/16/2017, 9:01 AM   LOS: 0 days

## 2017-07-17 DIAGNOSIS — Z923 Personal history of irradiation: Secondary | ICD-10-CM | POA: Diagnosis not present

## 2017-07-17 DIAGNOSIS — E876 Hypokalemia: Secondary | ICD-10-CM | POA: Diagnosis present

## 2017-07-17 DIAGNOSIS — Z96651 Presence of right artificial knee joint: Secondary | ICD-10-CM | POA: Diagnosis present

## 2017-07-17 DIAGNOSIS — K7040 Alcoholic hepatic failure without coma: Secondary | ICD-10-CM | POA: Diagnosis present

## 2017-07-17 DIAGNOSIS — D649 Anemia, unspecified: Secondary | ICD-10-CM | POA: Diagnosis present

## 2017-07-17 DIAGNOSIS — Z885 Allergy status to narcotic agent status: Secondary | ICD-10-CM | POA: Diagnosis not present

## 2017-07-17 DIAGNOSIS — D472 Monoclonal gammopathy: Secondary | ICD-10-CM | POA: Diagnosis present

## 2017-07-17 DIAGNOSIS — Z9842 Cataract extraction status, left eye: Secondary | ICD-10-CM | POA: Diagnosis not present

## 2017-07-17 DIAGNOSIS — Z79899 Other long term (current) drug therapy: Secondary | ICD-10-CM | POA: Diagnosis not present

## 2017-07-17 DIAGNOSIS — Z7682 Awaiting organ transplant status: Secondary | ICD-10-CM | POA: Diagnosis not present

## 2017-07-17 DIAGNOSIS — K7031 Alcoholic cirrhosis of liver with ascites: Secondary | ICD-10-CM | POA: Diagnosis not present

## 2017-07-17 DIAGNOSIS — N184 Chronic kidney disease, stage 4 (severe): Secondary | ICD-10-CM | POA: Diagnosis not present

## 2017-07-17 DIAGNOSIS — Z9841 Cataract extraction status, right eye: Secondary | ICD-10-CM | POA: Diagnosis not present

## 2017-07-17 DIAGNOSIS — K7290 Hepatic failure, unspecified without coma: Secondary | ICD-10-CM | POA: Diagnosis present

## 2017-07-17 DIAGNOSIS — E7220 Disorder of urea cycle metabolism, unspecified: Secondary | ICD-10-CM | POA: Diagnosis not present

## 2017-07-17 DIAGNOSIS — G9340 Encephalopathy, unspecified: Secondary | ICD-10-CM | POA: Diagnosis not present

## 2017-07-17 DIAGNOSIS — Z91040 Latex allergy status: Secondary | ICD-10-CM | POA: Diagnosis not present

## 2017-07-17 DIAGNOSIS — K7030 Alcoholic cirrhosis of liver without ascites: Secondary | ICD-10-CM | POA: Diagnosis present

## 2017-07-17 DIAGNOSIS — Z853 Personal history of malignant neoplasm of breast: Secondary | ICD-10-CM | POA: Diagnosis not present

## 2017-07-17 DIAGNOSIS — N186 End stage renal disease: Secondary | ICD-10-CM | POA: Diagnosis present

## 2017-07-17 DIAGNOSIS — I959 Hypotension, unspecified: Secondary | ICD-10-CM | POA: Diagnosis not present

## 2017-07-17 DIAGNOSIS — Z992 Dependence on renal dialysis: Secondary | ICD-10-CM | POA: Diagnosis not present

## 2017-07-17 DIAGNOSIS — Z9221 Personal history of antineoplastic chemotherapy: Secondary | ICD-10-CM | POA: Diagnosis not present

## 2017-07-17 DIAGNOSIS — I1311 Hypertensive heart and chronic kidney disease without heart failure, with stage 5 chronic kidney disease, or end stage renal disease: Secondary | ICD-10-CM | POA: Diagnosis present

## 2017-07-17 DIAGNOSIS — F1010 Alcohol abuse, uncomplicated: Secondary | ICD-10-CM | POA: Diagnosis present

## 2017-07-17 LAB — RENAL FUNCTION PANEL
ALBUMIN: 2.4 g/dL — AB (ref 3.5–5.0)
ANION GAP: 14 (ref 5–15)
BUN: 63 mg/dL — ABNORMAL HIGH (ref 6–20)
CALCIUM: 8.3 mg/dL — AB (ref 8.9–10.3)
CO2: 20 mmol/L — ABNORMAL LOW (ref 22–32)
CREATININE: 7.44 mg/dL — AB (ref 0.44–1.00)
Chloride: 104 mmol/L (ref 101–111)
GFR calc non Af Amer: 5 mL/min — ABNORMAL LOW (ref >60)
GFR, EST AFRICAN AMERICAN: 6 mL/min — AB (ref >60)
Glucose, Bld: 73 mg/dL (ref 65–99)
PHOSPHORUS: 10.2 mg/dL — AB (ref 2.5–4.6)
Potassium: 3.5 mmol/L (ref 3.5–5.1)
SODIUM: 138 mmol/L (ref 135–145)

## 2017-07-17 LAB — CBC
HCT: 33.1 % — ABNORMAL LOW (ref 36.0–46.0)
HEMOGLOBIN: 11.3 g/dL — AB (ref 12.0–15.0)
MCH: 28.6 pg (ref 26.0–34.0)
MCHC: 34.1 g/dL (ref 30.0–36.0)
MCV: 83.8 fL (ref 78.0–100.0)
Platelets: 135 10*3/uL — ABNORMAL LOW (ref 150–400)
RBC: 3.95 MIL/uL (ref 3.87–5.11)
RDW: 14.8 % (ref 11.5–15.5)
WBC: 5.6 10*3/uL (ref 4.0–10.5)

## 2017-07-17 LAB — AMMONIA: Ammonia: 64 umol/L — ABNORMAL HIGH (ref 9–35)

## 2017-07-17 LAB — T4, FREE: Free T4: 0.76 ng/dL (ref 0.61–1.12)

## 2017-07-17 LAB — TSH: TSH: 2.572 u[IU]/mL (ref 0.350–4.500)

## 2017-07-17 LAB — VITAMIN B12: VITAMIN B 12: 1306 pg/mL — AB (ref 180–914)

## 2017-07-17 MED ORDER — ALBUMIN HUMAN 25 % IV SOLN
25.0000 g | Freq: Once | INTRAVENOUS | Status: DC
  Filled 2017-07-17: qty 100

## 2017-07-17 MED ORDER — LACTULOSE 10 GM/15ML PO SOLN
45.0000 g | Freq: Three times a day (TID) | ORAL | Status: DC
  Administered 2017-07-17 – 2017-07-18 (×3): 45 g via ORAL
  Filled 2017-07-17 (×3): qty 90

## 2017-07-17 MED ORDER — RIFAXIMIN 550 MG PO TABS
550.0000 mg | ORAL_TABLET | Freq: Two times a day (BID) | ORAL | Status: DC
  Administered 2017-07-17 – 2017-07-18 (×2): 550 mg via ORAL
  Filled 2017-07-17 (×2): qty 1

## 2017-07-17 NOTE — Consult Note (Signed)
Referring Provider: Orson Eva, MD Primary Care Physician:  Rosita Fire, MD Primary Gastroenterologist:  Dr. Laural Golden  Reason for Consultation:   Hepatic encephalopathy in a patient with cirrhosis.  HPI:   Patient is 61 year old African-American female who has history of alcoholic cirrhosis complicated by refractory right-sided pleural effusion requiring TIPS in September 2016 who subsequently developed hepatic encephalopathy requiring multiple admissions in the past. Along the way she developed refractory lower extremity edema without ascites or effusion. She has undergone evaluation at [facility] for liver transplant and now listed for liver and renal transplant. She developed end-stage renal disease about a year ago and has been on hemodialysis. She is admitted to teaching service at [facility] from 04/23/2017 through 04/25/2017 for hepatic encephalopathy. No etiology was discovered and was felt she had spontaneous HE secondary to underlying liver disease. She has not been seen in our office since October last year since she has been actively followed at [facility]. Patient was brought to emergency room on the evening of 07/15/2017 for mental status changes. She was combative. Serum ammonia was elevated at 98(normal up to 35). Unenhanced brain CT was negative for acute abnormalities. She was felt to have hepatic encephalopathy and admitted to ICU. Initially she was given lactulose enemas and now she is on by mouth lactulose. She missed hemodialysis yesterday and she is wishing dialysis now. Patient has no complaints. She states she got sick because she did not get correct lactulose dose. She denies nausea vomiting fever chills or cough. She also denies abdominal pain melena or rectal bleeding. She states she is still passing some urine. She did notice some burning today but not previously. She tells me that she got a call from [facility] recently. She was advised to come to the  facility for transplant but she got second called 5 minutes later and advised not to come. Apparently kidney was not a good match. She denies taking OTC medications.    Past Medical History:  Diagnosis Date      . Brain aneurysm   . Breast disorder    right breast cancer 2002  . Cancer [facility])    breast cancer- lumpectromy, , chemotherapy, radiation  . Chronic back pain Diverticultis  . Chronic kidney disease (CKD) stage G3b/A3, moderately decreased glomerular filtration rate (GFR) between 30-44 mL/min/1.73 square meter and albuminuria creatinine ratio greater than 300 mg/g 08/30/2016  . Cirrhosis ([place])    alcoholic  . DDD (degenerative disc disease), lumbar   . Diverticulosis    scope 2014  . ETOH abuse   . Hepatic encephalopathy ([place]) 04/23/2017  . Hepatomegaly    scope 2014  . History of breast cancer 06/07/2016  . Hypertension   . 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 Pneumonia   . Rotator cuff syndrome of left shoulder   . Sleep apnea   . Thickened endometrium 06/19/2016   Will get endo biopsy     Past Surgical History:  Procedure Laterality Date  . AV FISTULA PLACEMENT Left 05/22/2017   Procedure: BRACHIAL ARTERY TO BASCILIC VEIN  FISTULA CREATION;  Surgeon: Elam Dutch, MD;  Location: [place];  Service: Vascular;  Laterality: Left;  . BRAIN SURGERY     aneurysm at [place] Right    cancer- lunmmnpectomy-   . CATARACT EXTRACTION Bilateral    [facility] 2 or 3 years ago  . CHOLECYSTECTOMY    .  COLONOSCOPY N/A 04/10/2013   Procedure: COLONOSCOPY;  Surgeon: Rogene Houston, MD;  [facility];  Service: Endoscopy;  Laterality: N/A;  1030-rescheduled to 1200 Ann notified pt  . ESOPHAGOGASTRODUODENOSCOPY N/A 04/22/2015   Procedure: ESOPHAGOGASTRODUODENOSCOPY (EGD);  Surgeon: Rogene Houston, MD;  [facility];  Service: Endoscopy;  Laterality: N/A;  .  IR GENERIC HISTORICAL  07/04/2016   IR RADIOLOGIST EVAL & MGMT 07/04/2016 Jacqulynn Cadet, MD [facility] INTERV RAD  . KNEE ARTHROSCOPY Left   . RADIOLOGY WITH ANESTHESIA N/A 07/16/2015   Procedure: TIPS;  Surgeon: Medication Radiologist, MD;  Location: [place];  Service: Radiology;  Laterality: N/A;  . ROTATOR CUFF REPAIR Left   . TOTAL KNEE ARTHROPLASTY Right    right. 2002    Prior to Admission medications   Medication Sig Start Date End Date Taking? Authorizing Provider  acetaminophen (TYLENOL) 325 MG tablet Take 650 mg by mouth every 6 (six) hours as needed.   Yes [provider]  lactulose (CHRONULAC) 10 GM/15ML solution Take 45 mLs (30 g total) by mouth 2 (two) times daily. Goal of 3 bowel movements per day 04/25/17  Yes Molt, Bethany, DO  midodrine (PROAMATINE) 5 MG tablet Take 5 mg by mouth 3 (three) times daily with meals. Mon/Wed/Fri (dialysis days) 04/16/17  Yes [provider]  RENVELA 800 MG tablet 1,600 mg 3 (three) times daily with meals. And one tablet with snack 07/11/17  Yes [provider]  rifaximin (XIFAXAN) 550 MG TABS tablet Take 1 tablet (550 mg total) by mouth 2 (two) times daily. 06/04/17  Yes Zunaira Lamy, Mechele Dawley, MD    Current Facility-Administered Medications  Medication Dose Route Frequency Provider Last Rate Last Dose  . 0.9 %  sodium chloride infusion  100 mL Intravenous PRN Befekadu, Eli Phillips, MD      . 0.9 %  sodium chloride infusion  100 mL Intravenous PRN Fran Lowes, MD      . acetaminophen (TYLENOL) tablet 650 mg  650 mg Oral Q6H PRN Elwin Mocha, MD       Or  . acetaminophen (TYLENOL) suppository 650 mg  650 mg Rectal Q6H PRN Elwin Mocha, MD      . albumin human 25 % solution 25 g  25 g Intravenous Once Fran Lowes, MD      . alteplase (CATHFLO ACTIVASE) injection 2 mg  2 mg Intracatheter Once PRN Fran Lowes, MD      . folic acid (FOLVITE) tablet 1 mg  1 mg Oral Daily Elwin Mocha, MD   1 mg at 07/17/17  1005  . heparin injection 1,000 Units  1,000 Units Dialysis PRN Fran Lowes, MD      . heparin injection 5,000 Units  5,000 Units Subcutaneous Q8H Elwin Mocha, MD   5,000 Units at 07/17/17 [phone]  . lactulose (CHRONULAC) 10 GM/15ML solution 45 g  45 g Oral TID Tat, David, MD      . lidocaine (PF) (XYLOCAINE) 1 % injection 5 mL  5 mL Intradermal PRN Fran Lowes, MD      . lidocaine-prilocaine (EMLA) cream 1 application  1 application Topical PRN Fran Lowes, MD      . LORazepam (ATIVAN) tablet 1-2 mg  1-2 mg Oral Q6H PRN Elwin Mocha, MD       Or  . LORazepam (ATIVAN) injection 2 mg  2 mg Intravenous Q6H PRN Elwin Mocha, MD   2 mg at 07/16/17 6222  . multivitamin with  minerals tablet 1 tablet  1 tablet Oral Daily Elwin Mocha, MD   1 tablet at 07/17/17 1005  . ondansetron (ZOFRAN) tablet 4 mg  4 mg Oral Q6H PRN Elwin Mocha, MD   4 mg at 07/17/17 1008   Or  . ondansetron (ZOFRAN) injection 4 mg  4 mg Intravenous Q6H PRN Elwin Mocha, MD      . pentafluoroprop-tetrafluoroeth (GEBAUERS) aerosol 1 application  1 application Topical PRN Fran Lowes, MD      . rifaximin Doreene Nest) tablet 550 mg  550 mg Oral BID Tat, David, MD      . sodium chloride flush (NS) 0.9 % injection 3 mL  3 mL Intravenous Q12H Elwin Mocha, MD   3 mL at 07/17/17 1010  . thiamine (VITAMIN B-1) tablet 100 mg  100 mg Oral Daily Elwin Mocha, MD       Or  . thiamine (B-1) injection 100 mg  100 mg Intravenous Daily Elwin Mocha, MD   100 mg at 07/17/17 1005    Allergies as of 07/15/2017 - Review Complete 07/15/2017  Allergen Reaction Noted  . Latex Swelling 07/15/2012  . Vicodin [hydrocodone-acetaminophen] Itching 09/06/2012    Family History  Problem Relation Age of Onset  . Aneurysm Mother   . Other Daughter        knee replacement  . Hypertension Daughter     Social History   Social History  . Marital status: Married    Spouse name: N/A  . Number  of children: N/A  . Years of education: N/A   Occupational History  . disabled    Social History Main Topics  . Smoking status: Never Smoker  . Smokeless tobacco: Never Used  . Alcohol use No     Comment: none since 03/2015  . Drug use: No  . Sexual activity: Not Currently    Birth control/ protection: Post-menopausal   Other Topics Concern  . 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 However he is fairly capable of taking care of himself at home. This couple have an attentive daughter who keeps an eye on both of them.    Review of Systems: See HPI, otherwise normal ROS  Physical Exam: Temp:  [97.6 F (36.4 C)-98.6 F (37 C)] 98.5 F (36.9 C) (09/18 1818) Pulse Rate:  [59-90] 76 (09/18 1818) Resp:  [12-20] 16 (09/18 1818) BP: (90-117)/(47-90) 110/73 (09/18 1818) SpO2:  [96 %-100 %] 97 % (09/18 1440) Weight:  [161 lb 9.6 oz (73.3 kg)-167 lb 5.3 oz (75.9 kg)] 161 lb 9.6 oz (73.3 kg) (09/18 1818) Last BM Date:  (retention flexicel)  Patient is alert and she knows she is at [facility]. She has asterixis. Conjunctiva was pink. Sclerae nonicteric. Oropharyngeal mucosa is normal. No neck masses or thyromegaly noted. She has right subclavian catheter and is undergoing hemodialysis. Cardiac exam with regular rhythm normal S1 and S2. No murmur or gallop noted. Lungs are clear to auscultation. Abdomen is symmetrical. Bowel sounds are normal. On palpation is soft and nontender without organomegaly or masses. Liver edge is indistinct. She has trace edema around ankles.   Lab Results:  Recent Labs  07/16/17 0545 07/16/17 1106 07/17/17 0610  WBC 3.4* 3.6* 5.6  HGB 11.9* 12.0 11.3*  HCT 36.1 36.3 33.1*  PLT 117* 119* 135*   BMET  Recent Labs  07/16/17 0545 07/16/17  1106 07/17/17 0610  NA 135 135 138  K 3.5 4.2 3.5  CL 97* 95* 104  CO2 19* 24 20*  GLUCOSE 139* 97 73   BUN 45* 48* 63*  CREATININE 6.61* 7.08* 7.44*  CALCIUM 9.2 9.5 8.3*   LFT  Recent Labs  07/15/17 2205  07/17/17 0610  PROT 5.7*  --   --   ALBUMIN 2.8*  < > 2.4*  AST 40  --   --   ALT 17  --   --   ALKPHOS 221*  --   --   BILITOT 1.9*  --   --   < > = values in this interval not displayed. PT/INR  Recent Labs  07/16/17 1356  LABPROT 16.9*  INR 1.39   Hepatitis Panel No results for input(s): HEPBSAG, HCVAB, HEPAIGM, HEPBIGM in the last 72 hours.  Studies/Results: Ct Head Wo Contrast  Result Date: 07/16/2017 CLINICAL DATA:  Altered mental status EXAM: CT HEAD WITHOUT CONTRAST TECHNIQUE: Contiguous axial images were obtained from the base of the skull through the vertex without intravenous contrast. COMPARISON:  09/24/2015 FINDINGS: Brain: Study limited by patient motion. No gross territorial infarct, hemorrhage or intracranial mass is seen. Scattered hypodensities within the bilateral white matter consistent small vessel ischemic change. Stable ventricle size. Vascular: No hyperdense vessels. Aneurysm clipping artifact at the sella. Skull: No fracture. Status post right frontal orbital craniotomy changes. Mild soft tissue thickening over the craniotomy site. Sinuses/Orbits: No acute finding. Other: None IMPRESSION: 1. Motion degraded study 2. No definite CT evidence for acute intracranial abnormality. Small vessel ischemic changes of the white matter 3. Stable right frontal orbital craniotomy changes. Slight increased soft tissue thickening over the craniotomy site, correlate with physical exam. Electronically Signed   By: Donavan Foil M.D.   On: 07/16/2017 00:51   Dg Chest Portable 1 View  Result Date: 07/15/2017 CLINICAL DATA:  Altered level of consciousness, weakness EXAM: PORTABLE CHEST 1 VIEW COMPARISON:  04/23/2017 FINDINGS: Right-sided central venous catheter tip overlies the right atrium. Mild cardiomegaly with slight central congestion. Hazy atelectasis at the left base.  No pneumothorax. Surgical clips right axilla. IMPRESSION: 1. Mild cardiomegaly with slight central vascular congestion 2. Hazy atelectasis at the left lung base. Electronically Signed   By: Donavan Foil M.D.   On: 07/15/2017 21:16    Assessment;  Hepatic encephalopathy in a patient with known advanced alcoholic cirrhosis who is listed for liver and renal transplant since she also has end-stage renal disease. This episode appears to be spontaneous without obvious trigger. She certainly could have missed lactulose dose. She was briefly hospitalized at [facility] about 3 months ago. It appears she has significantly improved in the last 24 hours. She has no hepatic reserve whatsoever and I hope that she will get liver and renal transplant in near future. I understand patient's transplant coordinator at [facility] is severe of her hospitalization. I will contact them in a.m.  Patient has not had any alcohol in at least 3 years. Therefore she does not need lorazepam for CIWA protocol.   Recommendations;  Continue current therapy with lactulose and Xifaxan. Consider stopping lorazepam.    LOS: 0 days   Dayna Geurts  07/17/2017, 6:35 PM

## 2017-07-17 NOTE — Progress Notes (Addendum)
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 Apparently, the patient has had worsening confusion over the period for the past week. The patient endorses compliance with dialysis as well as her lactulose. She denies any new over-the-counter medications or supplements. She denies any recent illnesses, fevers, chills. She denies hematochezia or melena. The patient states that she has been having at least 4 bowel movements on a daily basis. In addition, there is also concern as the patient received a new brand of lactulose from her pharmacy. Her daughter states that when this happens, the patient becomes encephalopathic which occurred during an admission in June 2018. There's been no complaints of fever, chest pain, abdominal pain, shortness breath, medications, melena. Upon presentation, the patient was afebrile hemodynamic stable. Ammonia was noted to be 98. The patient was started on lactulose enemas with clinical improvement.  Assessment/Plan: Hepatic encephalopathy -Unclear precipitating factor--the patient endorses compliance with rifaximin and lactulose, and she has 4 BMs daily--no recent illness or OTC meds; no signs of recent infection, no abdominal pain to suggest portal v. Thrombus, nor GI bleed -?hypokalemia causing elevated ammonia -consult GI, Dr. Laural Golden -consult dietician -Ammonia 98>>>64 -LFTs at baseline -switch to po lactulose now that pt more alert -Resume rifaximin  -CT brain neg -am ammonia  ESLD due to  alcoholic cirrhosis -Patient is on transplant list for a L/K transplant and currently follows with transplant hepatology at [facility]  - Treat hepatic encephalopathy as above   ESRD -Renal consulted by ED for dialysis -last HD 07/13/17 prior to admission  IgG lambda monoclonal gammopathy: Follows with heme/onc at [facility]. Prior IFE showed only IgG lambda monoclonal protein x 2. Renal biopsy showed no monoclonal protein deposition.    Disposition Plan:   Home 06/1917 if stable Family Communication:   Daughter updated at bedside 07/17/17--Total time spent 35 minutes.  Greater than 50% spent face to face counseling and coordinating care.  Consultants:  renal, GI  Code Status:  FULL  DVT Prophylaxis:  [place] Heparin    Procedures: As Listed in Progress Note Above  Antibiotics: None         Subjective: Patient denies fevers, chills, headache, chest pain, dyspnea, nausea, vomiting, diarrhea, abdominal pain, dysuria, hematuria, hematochezia, and melena.   Objective: Vitals:   07/17/17 0600 07/17/17 0800 07/17/17 0900 07/17/17 1000  BP: (!) 105/52 105/64 (!) 105/59 106/64  Pulse: 61 (!) 59 66 71  Resp: 16 18 18 18   Temp:  97.6 F (36.4 C)    TempSrc:  Oral    SpO2: 97% 97% 96% 97%  Weight:      Height:        Intake/Output Summary (Last 24 hours) at 07/17/17 1036 Last data filed at 07/17/17 1000  Gross per 24 hour  Intake              800 ml  Output             1500 ml  Net             -700 ml  Weight change: 2.771 kg (6 lb 1.7 oz) Exam:   General:  Pt is alert, follows commands appropriately, not in acute distress  HEENT: No icterus, No thrush, No neck mass, [place]/AT  Cardiovascular: RRR, S1/S2, no rubs, no gallops  Respiratory: CTA bilaterally, no wheezing, no crackles, no rhonchi  Abdomen: Soft/+BS, non tender, non distended, no guarding  Extremities: No edema, No lymphangitis, No petechiae, No rashes, no synovitis   Data Reviewed: I have personally  reviewed following labs and imaging studies Basic Metabolic Panel:  Recent Labs Lab 07/15/17 2205 07/16/17 0545 07/16/17 1106 07/17/17 0610  NA 134* 135 135 138  K 2.8* 3.5 4.2 3.5  CL 95* 97* 95* 104  CO2 22 19* 24 20*  GLUCOSE 103* 139* 97 73  BUN 44* 45* 48* 63*  CREATININE 6.93* 6.61* 7.08* 7.44*  CALCIUM 8.9 9.2 9.5 8.3*  MG 2.6*  --   --   --   PHOS 7.0*  --  9.0* 10.2*   Liver Function Tests:  Recent Labs Lab 07/15/17 2205 07/16/17 1106 07/17/17 0610  AST 40  --   --   ALT 17  --   --   ALKPHOS 221*  --   --   BILITOT 1.9*  --   --   PROT 5.7*  --   --   ALBUMIN 2.8* 2.6* 2.4*   No results for input(s): LIPASE, AMYLASE in the last 168 hours.  Recent Labs Lab 07/15/17 2205 07/17/17 0610  AMMONIA 98* 64*   Coagulation Profile:  Recent Labs Lab 07/16/17 1356  INR 1.39   CBC:  Recent Labs Lab 07/15/17 2205 07/16/17 0545 07/16/17 1106 07/17/17 0610  WBC 4.3 3.4* 3.6* 5.6  NEUTROABS 1.4*  --   --   --   HGB 11.9* 11.9* 12.0 11.3*  HCT 35.0* 36.1 36.3 33.1*  MCV 82.9 85.1 84.2 83.8  PLT 110* 117* 119* 135*   Cardiac Enzymes: No results for input(s): CKTOTAL, CKMB, CKMBINDEX, TROPONINI in the last 168 hours. BNP: Invalid input(s): POCBNP CBG: No results for input(s): GLUCAP in the last 168 hours. HbA1C: No results for input(s): HGBA1C in the last 72 hours. Urine analysis:    Component Value Date/Time   COLORURINE YELLOW 10/19/2015 [place] 10/19/2015 1656   LABSPEC 1.010 10/19/2015 1656   PHURINE 5.5 10/19/2015 1656   GLUCOSEU NEGATIVE 10/19/2015 1656   HGBUR SMALL (A) 10/19/2015 1656   BILIRUBINUR NEGATIVE 10/19/2015 1656   KETONESUR NEGATIVE 10/19/2015 1656   PROTEINUR TRACE (A) 10/19/2015 1656   UROBILINOGEN 0.2 08/30/2015 1642   NITRITE NEGATIVE 10/19/2015 1656   LEUKOCYTESUR NEGATIVE 10/19/2015 1656   Sepsis Labs: @LABRCNTIP (procalcitonin:4,lacticidven:4) ) Recent Results (from the past 240 hour(s))  MRSA  PCR Screening     Status: None   Collection Time: 07/16/17 12:45 AM  Result Value Ref Range Status   MRSA by PCR NEGATIVE NEGATIVE Final    Comment:        The GeneXpert MRSA Assay (FDA approved for NASAL specimens only), is one component of a comprehensive MRSA colonization surveillance program. It is not intended to diagnose MRSA infection nor to guide or monitor treatment for MRSA infections.      Scheduled Meds: . folic acid  1 mg Oral Daily  . heparin  5,000 Units Subcutaneous Q8H  . lactulose  300 mL Rectal Q6H  . multivitamin with minerals  1 tablet Oral Daily  . sodium chloride flush  3 mL Intravenous Q12H  .  thiamine  100 mg Oral Daily   Or  . thiamine  100 mg Intravenous Daily   Continuous Infusions: . sodium chloride    . sodium chloride    . albumin human      Procedures/Studies: Ct Head Wo Contrast  Result Date: 07/16/2017 CLINICAL DATA:  Altered mental status EXAM: CT HEAD WITHOUT CONTRAST TECHNIQUE: Contiguous axial images were obtained from the base of the skull through the vertex without intravenous contrast. COMPARISON:  09/24/2015 FINDINGS: Brain: Study limited by patient motion. No gross territorial infarct, hemorrhage or intracranial mass is seen. Scattered hypodensities within the bilateral white matter consistent small vessel ischemic change. Stable ventricle size. Vascular: No hyperdense vessels. Aneurysm clipping artifact at the sella. Skull: No fracture. Status post right frontal orbital craniotomy changes. Mild soft tissue thickening over the craniotomy site. Sinuses/Orbits: No acute finding. Other: None IMPRESSION: 1. Motion degraded study 2. No definite CT evidence for acute intracranial abnormality. Small vessel ischemic changes of the white matter 3. Stable right frontal orbital craniotomy changes. Slight increased soft tissue thickening over the craniotomy site, correlate with physical exam. Electronically Signed   By: Donavan Foil M.D.   On:  07/16/2017 00:51   Dg Chest Portable 1 View  Result Date: 07/15/2017 CLINICAL DATA:  Altered level of consciousness, weakness EXAM: PORTABLE CHEST 1 VIEW COMPARISON:  04/23/2017 FINDINGS: Right-sided central venous catheter tip overlies the right atrium. Mild cardiomegaly with slight central congestion. Hazy atelectasis at the left base. No pneumothorax. Surgical clips right axilla. IMPRESSION: 1. Mild cardiomegaly with slight central vascular congestion 2. Hazy atelectasis at the left lung base. Electronically Signed   By: Donavan Foil M.D.   On: 07/15/2017 21:16    Otto Felkins, DO  [facility] Pager [phone]  If 7PM-7AM, please contact night-coverage [email or web address] Password [email or web address] 07/17/2017, 10:36 AM   LOS: 0 days

## 2017-07-17 NOTE — Procedures (Signed)
    HEMODIALYSIS TREATMENT NOTE:  3.5 hour heparin-free dialysis completed via right subclavian tunneled catheter. Exit site unremarkable.  Goal met: 2.5 liters removed without interruption in ultrafiltration.  All blood was returned.  Report given to Miguel Dibble, RN.   Rockwell Alexandria, RN, CDN

## 2017-07-17 NOTE — Progress Notes (Signed)
Pt refusing heparin shot. Pt states it hurts and she doesn't want it. RN educated pt that Heparin shot was to prevent her from getting a DVT/blood clot. Pt verbalized understanding and restated she didn't want her "shot". Pt alert and oriented and answering all questions without any problem. Will continue to monitor pt

## 2017-07-17 NOTE — Progress Notes (Signed)
Subjective: Interval History: has complaints of being sore. Patient otherwise feels better. She denies any difficulty breathing. And patient also doesn't remember what happened..  Objective: Vital signs in last 24 hours: Temp:  [97.4 F (36.3 C)-98.4 F (36.9 C)] 98.3 F (36.8 C) (09/18 0400) Pulse Rate:  [60-90] 61 (09/18 0600) Resp:  [12-20] 16 (09/18 0600) BP: (90-134)/(47-90) 105/52 (09/18 0600) SpO2:  [96 %-100 %] 97 % (09/18 0600) Weight:  [75.8 kg (167 lb 1.7 oz)] 75.8 kg (167 lb 1.7 oz) (09/18 0500) Weight change: 2.771 kg (6 lb 1.7 oz)  Intake/Output from previous day: 09/17 0701 - 09/18 0700 In: 1500  Out: 1500 [Stool:1500] Intake/Output this shift: No intake/output data recorded.  The patient is sleepy but arousable. She was asking me what happens. She doesn't remember when she came to the hospital. She complains of some soreness from lying in bed. Chest is clear to auscultation Heart exam revealed regular rate and rhythm no murmurs Extremities no edema  Lab Results:  Recent Labs  07/16/17 1106 07/17/17 0610  WBC 3.6* 5.6  HGB 12.0 11.3*  HCT 36.3 33.1*  PLT 119* 135*   BMET:  Recent Labs  07/16/17 1106 07/17/17 0610  NA 135 138  K 4.2 3.5  CL 95* 104  CO2 24 20*  GLUCOSE 97 73  BUN 48* 63*  CREATININE 7.08* 7.44*  CALCIUM 9.5 8.3*   No results for input(s): PTH in the last 72 hours. Iron Studies: No results for input(s): IRON, TIBC, TRANSFERRIN, FERRITIN in the last 72 hours.  Studies/Results: Ct Head Wo Contrast  Result Date: 07/16/2017 CLINICAL DATA:  Altered mental status EXAM: CT HEAD WITHOUT CONTRAST TECHNIQUE: Contiguous axial images were obtained from the base of the skull through the vertex without intravenous contrast. COMPARISON:  09/24/2015 FINDINGS: Brain: Study limited by patient motion. No gross territorial infarct, hemorrhage or intracranial mass is seen. Scattered hypodensities within the bilateral white matter consistent small  vessel ischemic change. Stable ventricle size. Vascular: No hyperdense vessels. Aneurysm clipping artifact at the sella. Skull: No fracture. Status post right frontal orbital craniotomy changes. Mild soft tissue thickening over the craniotomy site. Sinuses/Orbits: No acute finding. Other: None IMPRESSION: 1. Motion degraded study 2. No definite CT evidence for acute intracranial abnormality. Small vessel ischemic changes of the white matter 3. Stable right frontal orbital craniotomy changes. Slight increased soft tissue thickening over the craniotomy site, correlate with physical exam. Electronically Signed   By: Donavan Foil M.D.   On: 07/16/2017 00:51   Dg Chest Portable 1 View  Result Date: 07/15/2017 CLINICAL DATA:  Altered level of consciousness, weakness EXAM: PORTABLE CHEST 1 VIEW COMPARISON:  04/23/2017 FINDINGS: Right-sided central venous catheter tip overlies the right atrium. Mild cardiomegaly with slight central congestion. Hazy atelectasis at the left base. No pneumothorax. Surgical clips right axilla. IMPRESSION: 1. Mild cardiomegaly with slight central vascular congestion 2. Hazy atelectasis at the left lung base. Electronically Signed   By: Donavan Foil M.D.   On: 07/15/2017 21:16    I have reviewed the patient's current medications.  Assessment/Plan: 1) altered mental status: This secondary possibly from hepatic encephalopathy. Her ammonia level has come down. Patient is looking much better.  2] end-stage renal disease: Presently her potassium is normal. 3] anemia: Her hemoglobin is within or target goal 4] bone and mineral  disorder: Her calcium is in range but phosphorus is very high. At this moment patient is not taking any binder. 5] liver cirrhosis  6] fluid  management: No sign of fluid overload 7] hypotension: Her blood pressure is low normal Plan: 1]We'll make currently need for patient to get dialysis today 2]We'll use albumin 25 g IV during dialysis We'll check a renal  panel and CBC in the morning   LOS: 0 days   Savannah Jackson S 07/17/2017,7:50 AM

## 2017-07-18 DIAGNOSIS — Z992 Dependence on renal dialysis: Secondary | ICD-10-CM

## 2017-07-18 DIAGNOSIS — N184 Chronic kidney disease, stage 4 (severe): Secondary | ICD-10-CM

## 2017-07-18 DIAGNOSIS — N186 End stage renal disease: Secondary | ICD-10-CM

## 2017-07-18 DIAGNOSIS — G9340 Encephalopathy, unspecified: Secondary | ICD-10-CM

## 2017-07-18 DIAGNOSIS — E7220 Disorder of urea cycle metabolism, unspecified: Secondary | ICD-10-CM

## 2017-07-18 DIAGNOSIS — Z7682 Awaiting organ transplant status: Principal | ICD-10-CM

## 2017-07-18 DIAGNOSIS — Z01818 Encounter for other preprocedural examination: Secondary | ICD-10-CM

## 2017-07-18 LAB — URINALYSIS, ROUTINE W REFLEX MICROSCOPIC
Bilirubin Urine: NEGATIVE
Glucose, UA: NEGATIVE mg/dL
Ketones, ur: NEGATIVE mg/dL
Nitrite: NEGATIVE
Protein, ur: >=300 mg/dL — AB
SPECIFIC GRAVITY, URINE: 1.017 (ref 1.005–1.030)
pH: 5 (ref 5.0–8.0)

## 2017-07-18 LAB — RENAL FUNCTION PANEL
ALBUMIN: 2.5 g/dL — AB (ref 3.5–5.0)
ANION GAP: 11 (ref 5–15)
BUN: 33 mg/dL — ABNORMAL HIGH (ref 6–20)
CO2: 25 mmol/L (ref 22–32)
Calcium: 8.6 mg/dL — ABNORMAL LOW (ref 8.9–10.3)
Chloride: 101 mmol/L (ref 101–111)
Creatinine, Ser: 5.86 mg/dL — ABNORMAL HIGH (ref 0.44–1.00)
GFR, EST AFRICAN AMERICAN: 8 mL/min — AB (ref >60)
GFR, EST NON AFRICAN AMERICAN: 7 mL/min — AB (ref >60)
Glucose, Bld: 92 mg/dL (ref 65–99)
PHOSPHORUS: 6.4 mg/dL — AB (ref 2.5–4.6)
POTASSIUM: 3.3 mmol/L — AB (ref 3.5–5.1)
Sodium: 137 mmol/L (ref 135–145)

## 2017-07-18 LAB — CBC
HEMATOCRIT: 35.1 % — AB (ref 36.0–46.0)
HEMOGLOBIN: 11.7 g/dL — AB (ref 12.0–15.0)
MCH: 28.1 pg (ref 26.0–34.0)
MCHC: 33.3 g/dL (ref 30.0–36.0)
MCV: 84.4 fL (ref 78.0–100.0)
Platelets: 101 10*3/uL — ABNORMAL LOW (ref 150–400)
RBC: 4.16 MIL/uL (ref 3.87–5.11)
RDW: 15.1 % (ref 11.5–15.5)
WBC: 5.8 10*3/uL (ref 4.0–10.5)

## 2017-07-18 LAB — HEPATITIS B SURFACE ANTIGEN: HEP B S AG: NEGATIVE

## 2017-07-18 LAB — AMMONIA: AMMONIA: 45 umol/L — AB (ref 9–35)

## 2017-07-18 MED ORDER — THIAMINE HCL 100 MG PO TABS: 100.0000 mg | ORAL_TABLET | Freq: Every day | ORAL | 3 refills | Status: DC

## 2017-07-18 MED ORDER — ADULT MULTIVITAMIN W/MINERALS CH: 1.0000 | ORAL_TABLET | Freq: Every day | ORAL | Status: DC

## 2017-07-18 MED ORDER — LACTULOSE 10 GM/15ML PO SOLN: 30.0000 g | Freq: Three times a day (TID) | ORAL | 0 refills | Status: DC

## 2017-07-18 MED ORDER — FOLIC ACID 1 MG PO TABS: 1.0000 mg | ORAL_TABLET | Freq: Every day | ORAL | Status: DC

## 2017-07-18 NOTE — Progress Notes (Signed)
RN offered to take flexiseal out 3 different times while in room with pt this am and pt refused stating she would get it taken out if she is to go home today.  Pt continues to refuse Heparin shot. Re-educated on purpose.

## 2017-07-18 NOTE — Discharge Summary (Signed)
Physician Discharge Summary  Savannah Jackson ZHG:[id number] DOB: 1956/10/25 DOA: 07/15/2017  PCP: Rosita Fire, MD  Admit date: 07/15/2017 Discharge date: 07/18/2017  Time spent: 45 minutes  Recommendations for Outpatient Follow-up:  -Will be discharged home today. -Advised to follow up with her dialysis center as scheduled.   Discharge Diagnoses:  Principal Problem:   Hyperammonemia ([place]) Active Problems:   Cirrhosis, alcoholic ([facility])   ETOH abuse   Chronic kidney disease (CKD), stage IV (severe) ([place])   Hepatic encephalopathy ([place])   ESRD on dialysis [facility])   Acute encephalopathy   Discharge Condition: Stable and improved  Filed Weights   07/18/17 0500 07/18/17 1055 07/18/17 1405  Weight: 72.4 kg (159 lb 9.6 oz) 72.4 kg (159 lb 9.8 oz) 72.5 kg (159 lb 13.3 oz)    History of present illness:  As per Dr. Aggie Moats on 9/17: Savannah Jackson is a 61 y.o. female with past medical history significant for breast cancer, end-stage renal disease, alcohol abuse history, recurrent hepatic encephalopathy presents emergency room with confusion. Per the patient's daughter who gives most of the history. Patient has been having trouble since the formulation of her lactulose was changed. She was previously admitted after this. And over the last week or so has gradually become more confused. Family contacted the patient's gastroenterologist who advised EDP evaluation.  ED course: Emergency room patient's motor levels found be elevated. She was highly combative. Needed Geodon times to 10 mg to calm her. Hospitalist consulted for admission.   Hospital Course:   Hepatic encephalopathy -Completely resolved, mental status back to baseline. Lactulose has been increased from twice a day to 22 3 times a day, continue rifaximin.. Ammonia level is 45 on discharge.  End-stage liver disease due to alcoholic cirrhosis -Patient is currently on a liver transplant list and follows with transplant  hepatology at [facility].  End-stage renal disease -Has been seen by nephrology while in the hospital.  Procedures:   None   Consultations:   Nephrology  GI  Discharge Instructions  Discharge Instructions    Diet - low sodium heart healthy    Complete by:  As directed    Increase activity slowly    Complete by:  As directed      Allergies as of 07/18/2017      Reactions   Latex Swelling   Discoloration of skin.    Vicodin [hydrocodone-acetaminophen] Itching      Medication List    TAKE these medications   acetaminophen 325 MG tablet Commonly known as:  TYLENOL Take 650 mg by mouth every 6 (six) hours as needed.   folic acid 1 MG tablet Commonly known as:  FOLVITE Take 1 tablet (1 mg total) by mouth daily.   lactulose 10 GM/15ML solution Commonly known as:  CHRONULAC Take 45 mLs (30 g total) by mouth 3 (three) times daily. Goal of 3 bowel movements per day What changed:  when to take this   midodrine 5 MG tablet Commonly known as:  PROAMATINE Take 5 mg by mouth 3 (three) times daily with meals. Mon/Wed/Fri (dialysis days)   multivitamin with minerals Tabs tablet Take 1 tablet by mouth daily.   RENVELA 800 MG tablet Generic drug:  sevelamer carbonate 1,600 mg 3 (three) times daily with meals. And one tablet with snack   rifaximin 550 MG Tabs tablet Commonly known as:  XIFAXAN Take 1 tablet (550 mg total) by mouth 2 (two) times daily.   thiamine 100 MG tablet Take 1  tablet (100 mg total) by mouth daily.            Discharge Care Instructions        Start     Ordered   44/03/47 4259  folic acid (FOLVITE) 1 MG tablet  Daily     07/18/17 1422   07/19/17 0000  Multiple Vitamin (MULTIVITAMIN WITH MINERALS) TABS tablet  Daily     07/18/17 1422   07/19/17 0000  thiamine 100 MG tablet  Daily     07/18/17 1422   07/18/17 0000  lactulose (CHRONULAC) 10 GM/15ML solution  3 times daily     07/18/17 1422   07/18/17 0000  Increase activity slowly      07/18/17 1422   07/18/17 0000  Diet - low sodium heart healthy     07/18/17 1422     Allergies  Allergen Reactions  . Latex Swelling    Discoloration of skin.   . Vicodin [Hydrocodone-Acetaminophen] Itching   Follow-up Information    Rosita Fire, MD. Schedule an appointment as soon as possible for a visit in 2 week(s).   Specialty:  Internal Medicine Contact information: [address] [place] [address] [phone]            The results of significant diagnostics from this hospitalization (including imaging, microbiology, ancillary and laboratory) are listed below for reference.    Significant Diagnostic Studies: Ct Head Wo Contrast  Result Date: 07/16/2017 CLINICAL DATA:  Altered mental status EXAM: CT HEAD WITHOUT CONTRAST TECHNIQUE: Contiguous axial images were obtained from the base of the skull through the vertex without intravenous contrast. COMPARISON:  09/24/2015 FINDINGS: Brain: Study limited by patient motion. No gross territorial infarct, hemorrhage or intracranial mass is seen. Scattered hypodensities within the bilateral white matter consistent small vessel ischemic change. Stable ventricle size. Vascular: No hyperdense vessels. Aneurysm clipping artifact at the sella. Skull: No fracture. Status post right frontal orbital craniotomy changes. Mild soft tissue thickening over the craniotomy site. Sinuses/Orbits: No acute finding. Other: None IMPRESSION: 1. Motion degraded study 2. No definite CT evidence for acute intracranial abnormality. Small vessel ischemic changes of the white matter 3. Stable right frontal orbital craniotomy changes. Slight increased soft tissue thickening over the craniotomy site, correlate with physical exam. Electronically Signed   By: Donavan Foil M.D.   On: 07/16/2017 00:51   Dg Chest Portable 1 View  Result Date: 07/15/2017 CLINICAL DATA:  Altered level of consciousness, weakness EXAM: PORTABLE CHEST 1 VIEW COMPARISON:   04/23/2017 FINDINGS: Right-sided central venous catheter tip overlies the right atrium. Mild cardiomegaly with slight central congestion. Hazy atelectasis at the left base. No pneumothorax. Surgical clips right axilla. IMPRESSION: 1. Mild cardiomegaly with slight central vascular congestion 2. Hazy atelectasis at the left lung base. Electronically Signed   By: Donavan Foil M.D.   On: 07/15/2017 21:16    Microbiology: Recent Results (from the past 240 hour(s))  MRSA PCR Screening     Status: None   Collection Time: 07/16/17 12:45 AM  Result Value Ref Range Status   MRSA by PCR NEGATIVE NEGATIVE Final    Comment:        The GeneXpert MRSA Assay (FDA approved for NASAL specimens only), is one component of a comprehensive MRSA colonization surveillance program. It is not intended to diagnose MRSA infection nor to guide or monitor treatment for MRSA infections.      Labs: Basic Metabolic Panel:  Recent Labs Lab 07/15/17 2205 07/16/17 0545 07/16/17 1106 07/17/17  0610 07/18/17 0710  NA 134* 135 135 138 137  K 2.8* 3.5 4.2 3.5 3.3*  CL 95* 97* 95* 104 101  CO2 22 19* 24 20* 25  GLUCOSE 103* 139* 97 73 92  BUN 44* 45* 48* 63* 33*  CREATININE 6.93* 6.61* 7.08* 7.44* 5.86*  CALCIUM 8.9 9.2 9.5 8.3* 8.6*  MG 2.6*  --   --   --   --   PHOS 7.0*  --  9.0* 10.2* 6.4*   Liver Function Tests:  Recent Labs Lab 07/15/17 2205 07/16/17 1106 07/17/17 0610 07/18/17 0710  AST 40  --   --   --   ALT 17  --   --   --   ALKPHOS 221*  --   --   --   BILITOT 1.9*  --   --   --   PROT 5.7*  --   --   --   ALBUMIN 2.8* 2.6* 2.4* 2.5*   No results for input(s): LIPASE, AMYLASE in the last 168 hours.  Recent Labs Lab 07/15/17 2205 07/17/17 0610 07/18/17 0710  AMMONIA 98* 64* 45*   CBC:  Recent Labs Lab 07/15/17 2205 07/16/17 0545 07/16/17 1106 07/17/17 0610 07/18/17 0710  WBC 4.3 3.4* 3.6* 5.6 5.8  NEUTROABS 1.4*  --   --   --   --   HGB 11.9* 11.9* 12.0 11.3* 11.7*    HCT 35.0* 36.1 36.3 33.1* 35.1*  MCV 82.9 85.1 84.2 83.8 84.4  PLT 110* 117* 119* 135* 101*   Cardiac Enzymes: No results for input(s): CKTOTAL, CKMB, CKMBINDEX, TROPONINI in the last 168 hours. BNP: BNP (last 3 results) No results for input(s): BNP in the last 8760 hours.  ProBNP (last 3 results) No results for input(s): PROBNP in the last 8760 hours.  CBG: No results for input(s): GLUCAP in the last 168 hours.     SignedLelon Frohlich  [facility] Pager: [phone] 07/18/2017, 2:22 PM

## 2017-07-18 NOTE — Progress Notes (Signed)
Patient ID: Savannah Jackson, female   DOB: 1956/09/09, 61 y.o.   MRN: [id number] States she feels good this am. No complaints. Has passed a small amt of urine in foley. No swelling to legs or abdomen. No confusion. Ammonia 45 this Blood pressure 102/67, pulse 70, temperature 97.7 F (36.5 C), temperature source Oral, resp. rate 16, height 5\' 6"  (1.676 m), weight 159 lb 9.8 oz (72.4 kg), SpO2 97 %. Assessment/Plan. Hepatic encephalopathy. Ammonia down to 45. No confusion. Dr. Laural Golden plans to talk to [facility] today.

## 2017-07-18 NOTE — Plan of Care (Signed)
Problem: Pain Managment: Goal: General experience of comfort will improve Outcome: Progressing Pt c/o headache pain x1 and given Tylenol; resolved. Pt has no more c/o pain. Pt educated on pain mgmt and medications available. Pt verbalized understanding. Will continue to monitor pt

## 2017-07-18 NOTE — Progress Notes (Signed)
Savannah Jackson  MRN: [id number]  DOB/AGE: 62/03/1956 62 y.o.  Primary Care Physician:Fanta, Tesfaye, MD  Admit date: 07/15/2017  Chief Complaint:  Chief Complaint  Patient presents with  . Altered Mental Status    S-Pt presented on  07/15/2017 with  Chief Complaint  Patient presents with  . Altered Mental Status  .    Pt today feels better.      Pt and her daughter main concern is " Will I get my dialysis today?"  Meds . folic acid  1 mg Oral Daily  . heparin  5,000 Units Subcutaneous Q8H  . lactulose  45 g Oral TID  . multivitamin with minerals  1 tablet Oral Daily  . rifaximin  550 mg Oral BID  . sodium chloride flush  3 mL Intravenous Q12H  . thiamine  100 mg Oral Daily   Or  . thiamine  100 mg Intravenous Daily        Physical Exam: Vital signs in last 24 hours: Temp:  [97.8 F (36.6 C)-98.6 F (37 C)] 97.8 F (36.6 C) (09/19 5093) Pulse Rate:  [61-79] 71 (09/19 0635) Resp:  [16-19] 16 (09/19 0635) BP: (100-113)/(58-78) 104/63 (09/19 0635) SpO2:  [94 %-100 %] 98 % (09/19 0635) Weight:  [159 lb 9.6 oz (72.4 kg)-167 lb 5.3 oz (75.9 kg)] 159 lb 9.6 oz (72.4 kg) (09/19 0500) Weight change: 3.5 oz (0.1 kg) Last BM Date: 07/17/17  Intake/Output from previous day: 09/18 0701 - 09/19 0700 In: 370 [P.O.:370] Out: 3000 [Stool:500] No intake/output data recorded.   Physical Exam: General- pt is awake,alert, oriented to time place and person Resp- No acute REsp distress, CTA B/L NO Rhonchi CVS- S1S2 regular in rate and rhythm GIT- BS+, soft, NT, ND EXT- NO LE Edema, NO Cyanosis Access- PC in situ                Left AVF + Thrill not mature for use yet.  Lab Results: CBC  Recent Labs  07/17/17 0610 07/18/17 0710  WBC 5.6 5.8  HGB 11.3* 11.7*  HCT 33.1* 35.1*  PLT 135* 101*    BMET  Recent Labs  07/17/17 0610 07/18/17 0710  NA 138 137  K 3.5 3.3*  CL 104 101  CO2 20* 25  GLUCOSE 73 92  BUN 63* 33*  CREATININE 7.44* 5.86*  CALCIUM  8.3* 8.6*    MICRO Recent Results (from the past 240 hour(s))  MRSA PCR Screening     Status: None   Collection Time: 07/16/17 12:45 AM  Result Value Ref Range Status   MRSA by PCR NEGATIVE NEGATIVE Final    Comment:        The GeneXpert MRSA Assay (FDA approved for NASAL specimens only), is one component of a comprehensive MRSA colonization surveillance program. It is not intended to diagnose MRSA infection nor to guide or monitor treatment for MRSA infections.       Lab Results  Component Value Date   CALCIUM 8.6 (L) 07/18/2017   CAION 0.92 (L) 04/23/2017   PHOS 6.4 (H) 07/18/2017               Impression: 1)Renal  ESRD on HD                Pt is on Monday/Wednesday/Friday schedule                Pt was last dialyzed yesterday as pt missed Monday  Will dialyze today to help with scheduling as outpt in case pt is d/ced home   2)HTN BP stable  3)Anemia In ESRD the goal for hgb is 9--11 HGb is at goal  4)CKD Mineral-Bone Disorder Phosphorus not at goal.   But much better Calcium when corrected for low albumin is at goal.  5)Liver-admitted with hepatic encephalopathy PMD following  6)Electrolytes  Hypokalemic    Hd yesterday and on lactulose  NOrmonatremic   7)Acid base Co2 at goal     Plan:  Will use 4 k bath  Will dialyze today     BHUTANI,MANPREET S 07/18/2017, 9:09 AM

## 2017-07-18 NOTE — Progress Notes (Signed)
Patient is to be discharged home and in stable condition. IV removed, WNL. Patient given discharge instructions and verbalized understanding. All questions addressed and answered. Patient escorted out by staff via wheelchair.   Celestia Khat, RN

## 2017-07-18 NOTE — Procedures (Signed)
    HEMODIALYSIS TREATMENT NOTE:  3 hour heparin-free dialysis completed via right subclavian tunneled catheter. Exit site unremarkable. Goal NOT met: Unable to tolerate minimal UF.  NET UF 0cc.  All blood was returned.  Report given to Barbra Sarks, RN.  Rockwell Alexandria, RN, CDN

## 2017-07-20 DIAGNOSIS — N186 End stage renal disease: Secondary | ICD-10-CM | POA: Diagnosis not present

## 2017-07-20 DIAGNOSIS — Z992 Dependence on renal dialysis: Secondary | ICD-10-CM | POA: Diagnosis not present

## 2017-07-23 DIAGNOSIS — N186 End stage renal disease: Secondary | ICD-10-CM | POA: Diagnosis not present

## 2017-07-23 DIAGNOSIS — Z992 Dependence on renal dialysis: Secondary | ICD-10-CM | POA: Diagnosis not present

## 2017-07-24 ENCOUNTER — Ambulatory Visit
Admission: RE | Admit: 2017-07-24 | Discharge: 2017-07-24 | Disposition: A | Discharge status: Home / Self Care | Payer: Medicare HMO | Source: Ambulatory Visit | Attending: Interventional Radiology | Admitting: Interventional Radiology

## 2017-07-24 ENCOUNTER — Ambulatory Visit
Admission: RE | Admit: 2017-07-24 | Discharge: 2017-07-24 | Disposition: A | Discharge status: Home / Self Care | Payer: Medicare HMO | Source: Ambulatory Visit | Attending: Radiology | Admitting: Radiology

## 2017-07-24 ENCOUNTER — Other Ambulatory Visit (HOSPITAL_COMMUNITY): Payer: Self-pay | Admitting: Interventional Radiology

## 2017-07-24 DIAGNOSIS — K7031 Alcoholic cirrhosis of liver with ascites: Secondary | ICD-10-CM

## 2017-07-24 DIAGNOSIS — Z8719 Personal history of other diseases of the digestive system: Secondary | ICD-10-CM | POA: Diagnosis not present

## 2017-07-24 DIAGNOSIS — K769 Liver disease, unspecified: Secondary | ICD-10-CM | POA: Diagnosis not present

## 2017-07-24 DIAGNOSIS — J90 Pleural effusion, not elsewhere classified: Secondary | ICD-10-CM

## 2017-07-24 DIAGNOSIS — J918 Pleural effusion in other conditions classified elsewhere: Secondary | ICD-10-CM

## 2017-07-24 DIAGNOSIS — Z9889 Other specified postprocedural states: Secondary | ICD-10-CM | POA: Diagnosis not present

## 2017-07-24 DIAGNOSIS — K7290 Hepatic failure, unspecified without coma: Secondary | ICD-10-CM | POA: Diagnosis not present

## 2017-07-24 HISTORY — PX: IR RADIOLOGIST EVAL & MGMT: IMG5224

## 2017-07-24 NOTE — Progress Notes (Signed)
Chief Complaint: Patient was seen in follow-up today for post TIPS surveilance   History of Present Illness: Savannah Jackson is a 61 y.o. female with a history of alcoholic cirrhosis complicated by ascites and recurrent symptomatic hepatic hydrothorax. After undergoing numerous right-sided thoracenteses, she underwent creation of an elective right hepatic to right portal vein TIPS on 07/16/2015.  Since that time, she has done very well in terms of her hepatic hydrothorax. She has required only one additional thoracentesis in January 2018 in the setting of progressive chronic kidney disease. She is now on dialysis which she is tolerating well.  Her TIPS has been completed in the past by hepatic encephalopathy but this is currently managed with lactulose and rifaximin. She was hospitalized last week with encephalopathy when her pharmacy ran out of lactulose and she had difficulty filling her prescription. She is currently recovered and doing well without complaints today.  She denies confusion, balance problems, abdominal pain, shortness of breath, chest pain or other systemic symptoms at this time. She is currently on the transplant list for a combined liver and kidney transplant at [facility].  Past Medical History:  Diagnosis Date  . Bilateral leg edema   . Brain aneurysm   . Breast disorder    right breast cancer 2002  . Cancer [facility])    breast cancer- lumpectromy, , chemotherapy, radiation  . Chronic back pain Diverticultis  . Chronic kidney disease (CKD) stage G3b/A3, moderately decreased glomerular filtration rate (GFR) between 30-44 mL/min/1.73 square meter and albuminuria creatinine ratio greater than 300 mg/g 08/30/2016  . Cirrhosis ([place])    alcoholic  . DDD (degenerative disc disease), lumbar   . Diverticulosis    scope 2014  . ETOH abuse   . Hepatic encephalopathy ([place]) 04/23/2017  . Hepatomegaly    scope 2014  . History of breast cancer 06/07/2016  .  Hypertension   . 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 Pneumonia   . Rotator cuff syndrome of left shoulder   . Sleep apnea   . Thickened endometrium 06/19/2016   Will get endo biopsy     Past Surgical History:  Procedure Laterality Date  . AV FISTULA PLACEMENT Left 05/22/2017   Procedure: BRACHIAL ARTERY TO BASCILIC VEIN  FISTULA CREATION;  Surgeon: Elam Dutch, MD;  Location: [place];  Service: Vascular;  Laterality: Left;  . BRAIN SURGERY     aneurysm at [place] Right    cancer- lunmmnpectomy-   . CATARACT EXTRACTION Bilateral    [facility] 2 or 3 years ago  . CHOLECYSTECTOMY    . COLONOSCOPY N/A 04/10/2013   Procedure: COLONOSCOPY;  Surgeon: Rogene Houston, MD;  [facility];  Service: Endoscopy;  Laterality: N/A;  1030-rescheduled to 1200 Ann notified pt  . ESOPHAGOGASTRODUODENOSCOPY N/A 04/22/2015   Procedure: ESOPHAGOGASTRODUODENOSCOPY (EGD);  Surgeon: Rogene Houston, MD;  [facility];  Service: Endoscopy;  Laterality: N/A;  . IR GENERIC HISTORICAL  07/04/2016   IR RADIOLOGIST EVAL & MGMT 07/04/2016 Jacqulynn Cadet, MD [facility] INTERV RAD  . KNEE ARTHROSCOPY Left   . RADIOLOGY WITH ANESTHESIA N/A 07/16/2015   Procedure: TIPS;  Surgeon: Medication Radiologist, MD;  Location[place];  Service: Radiology;  Laterality: N/A;  . ROTATOR CUFF REPAIR Left   . TOTAL KNEE ARTHROPLASTY Right    right. 2002    Allergies: Latex and Vicodin [hydrocodone-acetaminophen]  Medications: Prior to  Admission medications   Medication Sig Start Date End Date Taking? Authorizing Provider  acetaminophen (TYLENOL) 325 MG tablet Take 650 mg by mouth every 6 (six) hours as needed.   Yes [provider]  folic acid (FOLVITE) 1 MG tablet Take 1 tablet (1 mg total) by mouth daily. 07/19/17  Yes Isaac Bliss, Rayford Halsted, MD  lactulose (CHRONULAC) 10 GM/15ML solution Take  45 mLs (30 g total) by mouth 3 (three) times daily. Goal of 3 bowel movements per day 07/18/17  Yes Isaac Bliss, Rayford Halsted, MD  midodrine (PROAMATINE) 5 MG tablet Take 5 mg by mouth 3 (three) times daily with meals. Mon/Wed/Fri (dialysis days) 04/16/17  Yes [provider]  Multiple Vitamin (MULTIVITAMIN WITH MINERALS) TABS tablet Take 1 tablet by mouth daily. 07/19/17  Yes Erline Hau, MD  RENVELA 800 MG tablet 1,600 mg 3 (three) times daily with meals. And one tablet with snack 07/11/17  Yes [provider]  rifaximin (XIFAXAN) 550 MG TABS tablet Take 1 tablet (550 mg total) by mouth 2 (two) times daily. 06/04/17  Yes Rehman, Mechele Dawley, MD  thiamine 100 MG tablet Take 1 tablet (100 mg total) by mouth daily. 07/19/17  Yes Erline Hau, MD     Family History  Problem Relation Age of Onset  . Aneurysm Mother   . Other Daughter        knee replacement  . Hypertension Daughter     Social History   Social History  . Marital status: Married    Spouse name: N/A  . Number of children: N/A  . Years of education: N/A   Occupational History  . disabled    Social History Main Topics  . Smoking status: Never Smoker  . Smokeless tobacco: Never Used  . Alcohol use No     Comment: none since 03/2015  . Drug use: No  . Sexual activity: Not Currently    Birth control/ protection: Post-menopausal   Other Topics Concern  . 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 However he is fairly capable of taking care of himself at home. This couple have an attentive daughter who keeps an eye on both of them.    Review of Systems: A 12 point ROS discussed and pertinent positives are indicated in the HPI above.  All other systems are negative.  Review of Systems  Vital Signs: BP 104/66 (BP Location: Right Arm, Cuff Size: Normal)   Pulse 61   Temp 97.9 F (36.6 C)    Resp 14   SpO2 100%   Physical Exam  Constitutional: She is oriented to person, place, and time. She appears well-developed and well-nourished. No distress.  HENT:  Head: Normocephalic and atraumatic.  Eyes: No scleral icterus.  Cardiovascular: Normal rate and regular rhythm.   Pulmonary/Chest: Effort normal.  Abdominal: Soft. She exhibits no distension. There is no tenderness.  Neurological: She is alert and oriented to person, place, and time.  Skin: Skin is warm and dry.  Psychiatric: She has a normal mood and affect. Her behavior is normal.  Nursing note and vitals reviewed.    Imaging: Ct Head Wo Contrast  Result Date: 07/16/2017 CLINICAL DATA:  Altered mental status EXAM: CT HEAD WITHOUT CONTRAST TECHNIQUE: Contiguous axial images were obtained from the base of the skull through the vertex without intravenous contrast. COMPARISON:  09/24/2015 FINDINGS: Brain: Study limited by  patient motion. No gross territorial infarct, hemorrhage or intracranial mass is seen. Scattered hypodensities within the bilateral white matter consistent small vessel ischemic change. Stable ventricle size. Vascular: No hyperdense vessels. Aneurysm clipping artifact at the sella. Skull: No fracture. Status post right frontal orbital craniotomy changes. Mild soft tissue thickening over the craniotomy site. Sinuses/Orbits: No acute finding. Other: None IMPRESSION: 1. Motion degraded study 2. No definite CT evidence for acute intracranial abnormality. Small vessel ischemic changes of the white matter 3. Stable right frontal orbital craniotomy changes. Slight increased soft tissue thickening over the craniotomy site, correlate with physical exam. Electronically Signed   By: Donavan Foil M.D.   On: 07/16/2017 00:51   Dg Chest Portable 1 View  Result Date: 07/15/2017 CLINICAL DATA:  Altered level of consciousness, weakness EXAM: PORTABLE CHEST 1 VIEW COMPARISON:  04/23/2017 FINDINGS: Right-sided central venous  catheter tip overlies the right atrium. Mild cardiomegaly with slight central congestion. Hazy atelectasis at the left base. No pneumothorax. Surgical clips right axilla. IMPRESSION: 1. Mild cardiomegaly with slight central vascular congestion 2. Hazy atelectasis at the left lung base. Electronically Signed   By: Donavan Foil M.D.   On: 07/15/2017 21:16    Labs:  CBC:  Recent Labs  07/16/17 0545 07/16/17 1106 07/17/17 0610 07/18/17 0710  WBC 3.4* 3.6* 5.6 5.8  HGB 11.9* 12.0 11.3* 11.7*  HCT 36.1 36.3 33.1* 35.1*  PLT 117* 119* 135* 101*    COAGS:  Recent Labs  10/30/16 0447 04/23/17 0849 07/16/17 1356  INR 1.51 1.29 1.39  APTT  --   --  43*    BMP:  Recent Labs  07/16/17 0545 07/16/17 1106 07/17/17 0610 07/18/17 0710  NA 135 135 138 137  K 3.5 4.2 3.5 3.3*  CL 97* 95* 104 101  CO2 19* 24 20* 25  GLUCOSE 139* 97 73 92  BUN 45* 48* 63* 33*  CALCIUM 9.2 9.5 8.3* 8.6*  CREATININE 6.61* 7.08* 7.44* 5.86*  GFRNONAA 6* 6* 5* 7*  GFRAA 7* 6* 6* 8*    LIVER FUNCTION TESTS:  Recent Labs  04/24/17 0946 04/25/17 0805 05/22/17 0639 07/15/17 2205 07/16/17 1106 07/17/17 0610 07/18/17 0710  BILITOT 3.7* 3.3* 2.0* 1.9*  --   --   --   AST 45* 41 47* 40  --   --   --   ALT 16 16 18 17   --   --   --   ALKPHOS 170* 149* 257* 221*  --   --   --   PROT 5.2* 4.9* 5.5* 5.7*  --   --   --   ALBUMIN 2.5* 2.3* 2.7* 2.8* 2.6* 2.4* 2.5*    TUMOR MARKERS: No results for input(s): AFPTM, CEA, CA199, CHROMGRNA in the last 8760 hours.  Assessment and Plan:  Doing very well 2 years status post TIPS for hepatic hydrothorax. Since her TIPS was placed, she has only required one thoracentesis in January 2018 in the setting of progressive chronic kidney disease. She is now on dialysis and has no evidence of pleural effusion. She is currently on the transplant list for a combined liver and kidney transplantation at [facility].  Overall, she is feeling well and pleased with  her TIPS.  1.) Continue lactulose and rifaximin 2.) Return clinic visit with TIPS Doppler ultrasound in one year.    Electronically Signed: Jacqulynn Cadet 07/24/2017, 10:12 AM   I spent a total of 15 Minutes in face to face in clinical consultation, greater than  50% of which was counseling/coordinating care for hepatic hydrothorax.

## 2017-07-25 DIAGNOSIS — Z992 Dependence on renal dialysis: Secondary | ICD-10-CM | POA: Diagnosis not present

## 2017-07-25 DIAGNOSIS — N186 End stage renal disease: Secondary | ICD-10-CM | POA: Diagnosis not present

## 2017-07-27 DIAGNOSIS — N186 End stage renal disease: Secondary | ICD-10-CM | POA: Diagnosis not present

## 2017-07-27 DIAGNOSIS — Z992 Dependence on renal dialysis: Secondary | ICD-10-CM | POA: Diagnosis not present

## 2017-07-29 DIAGNOSIS — N186 End stage renal disease: Secondary | ICD-10-CM | POA: Diagnosis not present

## 2017-07-29 DIAGNOSIS — Z992 Dependence on renal dialysis: Secondary | ICD-10-CM | POA: Diagnosis not present

## 2017-07-30 ENCOUNTER — Other Ambulatory Visit: Payer: Self-pay | Admitting: *Deleted

## 2017-07-30 DIAGNOSIS — N186 End stage renal disease: Secondary | ICD-10-CM | POA: Diagnosis not present

## 2017-07-30 DIAGNOSIS — Z992 Dependence on renal dialysis: Secondary | ICD-10-CM | POA: Diagnosis not present

## 2017-07-31 ENCOUNTER — Observation Stay
Admission: EM | Admit: 2017-07-31 | Discharge: 2017-07-31 | Disposition: A | Discharge status: Home / Self Care | Source: Assisted Living Facility

## 2017-07-31 DIAGNOSIS — K7290 Hepatic failure, unspecified without coma: Secondary | ICD-10-CM | POA: Diagnosis not present

## 2017-07-31 DIAGNOSIS — I120 Hypertensive chronic kidney disease with stage 5 chronic kidney disease or end stage renal disease: Secondary | ICD-10-CM | POA: Diagnosis not present

## 2017-07-31 DIAGNOSIS — K7031 Alcoholic cirrhosis of liver with ascites: Secondary | ICD-10-CM | POA: Diagnosis not present

## 2017-07-31 DIAGNOSIS — N186 End stage renal disease: Secondary | ICD-10-CM | POA: Diagnosis not present

## 2017-08-01 ENCOUNTER — Ambulatory Visit
Admission: RE | Admit: 2017-08-01 | Discharge: 2017-08-01 | Disposition: A | Discharge status: Home / Self Care | Payer: MEDICARE | Attending: Nephrology | Admitting: Nephrology

## 2017-08-01 DIAGNOSIS — N186 End stage renal disease: Secondary | ICD-10-CM | POA: Diagnosis not present

## 2017-08-01 DIAGNOSIS — Z992 Dependence on renal dialysis: Secondary | ICD-10-CM | POA: Diagnosis not present

## 2017-08-01 DIAGNOSIS — Z7682 Awaiting organ transplant status: Secondary | ICD-10-CM | POA: Diagnosis not present

## 2017-08-01 DIAGNOSIS — Z01818 Encounter for other preprocedural examination: Secondary | ICD-10-CM | POA: Diagnosis not present

## 2017-08-02 DIAGNOSIS — D684 Acquired coagulation factor deficiency: Secondary | ICD-10-CM | POA: Diagnosis not present

## 2017-08-02 DIAGNOSIS — Z23 Encounter for immunization: Secondary | ICD-10-CM | POA: Diagnosis not present

## 2017-08-02 DIAGNOSIS — I10 Essential (primary) hypertension: Secondary | ICD-10-CM | POA: Diagnosis not present

## 2017-08-02 DIAGNOSIS — N183 Chronic kidney disease, stage 3 (moderate): Secondary | ICD-10-CM | POA: Diagnosis not present

## 2017-08-02 DIAGNOSIS — K7031 Alcoholic cirrhosis of liver with ascites: Secondary | ICD-10-CM | POA: Diagnosis not present

## 2017-08-03 ENCOUNTER — Encounter (HOSPITAL_COMMUNITY): Payer: Self-pay | Admitting: *Deleted

## 2017-08-03 DIAGNOSIS — N186 End stage renal disease: Secondary | ICD-10-CM | POA: Diagnosis not present

## 2017-08-03 DIAGNOSIS — Z992 Dependence on renal dialysis: Secondary | ICD-10-CM | POA: Diagnosis not present

## 2017-08-03 NOTE — Progress Notes (Signed)
Pt denies SOB, chest pain, and being under the care of a cardiologist. Pt denies having a cardiac cath. Pt made aware to stop taking vitamins, fish oil and herbal medications. Do not take any NSAIDs ie: Ibuprofen, Advil, Naproxen (Aleve), Motrin, BC and Goody Powder. Pt verbalized understanding of all pre-op instructions.

## 2017-08-06 DIAGNOSIS — Z992 Dependence on renal dialysis: Secondary | ICD-10-CM | POA: Diagnosis not present

## 2017-08-06 DIAGNOSIS — N186 End stage renal disease: Secondary | ICD-10-CM | POA: Diagnosis not present

## 2017-08-07 ENCOUNTER — Ambulatory Visit (HOSPITAL_COMMUNITY): Payer: Medicare HMO | Admitting: Anesthesiology

## 2017-08-07 ENCOUNTER — Encounter (HOSPITAL_COMMUNITY)
Admission: RE | Disposition: A | Discharge status: Home / Self Care | Payer: Self-pay | Source: Ambulatory Visit | Attending: Vascular Surgery

## 2017-08-07 ENCOUNTER — Telehealth: Payer: Self-pay | Admitting: Vascular Surgery

## 2017-08-07 ENCOUNTER — Ambulatory Visit (HOSPITAL_COMMUNITY)
Admission: RE | Admit: 2017-08-07 | Discharge: 2017-08-07 | Disposition: A | Discharge status: Home / Self Care | Payer: Medicare HMO | Source: Ambulatory Visit | Attending: Vascular Surgery | Admitting: Vascular Surgery

## 2017-08-07 ENCOUNTER — Encounter (HOSPITAL_COMMUNITY): Payer: Self-pay | Admitting: Surgery

## 2017-08-07 DIAGNOSIS — I132 Hypertensive heart and chronic kidney disease with heart failure and with stage 5 chronic kidney disease, or end stage renal disease: Secondary | ICD-10-CM | POA: Diagnosis not present

## 2017-08-07 DIAGNOSIS — N186 End stage renal disease: Secondary | ICD-10-CM | POA: Insufficient documentation

## 2017-08-07 DIAGNOSIS — Z79899 Other long term (current) drug therapy: Secondary | ICD-10-CM | POA: Insufficient documentation

## 2017-08-07 DIAGNOSIS — I5032 Chronic diastolic (congestive) heart failure: Secondary | ICD-10-CM | POA: Diagnosis not present

## 2017-08-07 DIAGNOSIS — Z791 Long term (current) use of non-steroidal anti-inflammatories (NSAID): Secondary | ICD-10-CM | POA: Diagnosis not present

## 2017-08-07 DIAGNOSIS — N185 Chronic kidney disease, stage 5: Secondary | ICD-10-CM | POA: Diagnosis not present

## 2017-08-07 DIAGNOSIS — D631 Anemia in chronic kidney disease: Secondary | ICD-10-CM | POA: Diagnosis not present

## 2017-08-07 DIAGNOSIS — Z01818 Encounter for other preprocedural examination: Secondary | ICD-10-CM

## 2017-08-07 DIAGNOSIS — Z7682 Awaiting organ transplant status: Principal | ICD-10-CM

## 2017-08-07 HISTORY — PX: BASCILIC VEIN TRANSPOSITION: SHX5742

## 2017-08-07 LAB — POCT I-STAT 4, (NA,K, GLUC, HGB,HCT)
Glucose, Bld: 92 mg/dL (ref 65–99)
HCT: 31 % — ABNORMAL LOW (ref 36.0–46.0)
Hemoglobin: 10.5 g/dL — ABNORMAL LOW (ref 12.0–15.0)
POTASSIUM: 3.2 mmol/L — AB (ref 3.5–5.1)
SODIUM: 137 mmol/L (ref 135–145)

## 2017-08-07 SURGERY — TRANSPOSITION, VEIN, BASILIC
Anesthesia: General | Laterality: Left

## 2017-08-07 MED ORDER — LIDOCAINE HCL (CARDIAC) 20 MG/ML IV SOLN
INTRAVENOUS | Status: DC | PRN
  Administered 2017-08-07: 45 mg via INTRAVENOUS

## 2017-08-07 MED ORDER — NEOSTIGMINE METHYLSULFATE 5 MG/5ML IV SOSY
PREFILLED_SYRINGE | INTRAVENOUS | Status: AC
  Filled 2017-08-07: qty 5

## 2017-08-07 MED ORDER — GLYCOPYRROLATE 0.2 MG/ML IJ SOLN
INTRAMUSCULAR | Status: DC | PRN
  Administered 2017-08-07: .2 mg via INTRAVENOUS

## 2017-08-07 MED ORDER — DEXAMETHASONE SODIUM PHOSPHATE 10 MG/ML IJ SOLN
INTRAMUSCULAR | Status: DC | PRN
  Administered 2017-08-07: 8 mg via INTRAVENOUS

## 2017-08-07 MED ORDER — ONDANSETRON HCL 4 MG/2ML IJ SOLN
INTRAMUSCULAR | Status: DC | PRN
  Administered 2017-08-07: 4 mg via INTRAVENOUS

## 2017-08-07 MED ORDER — OXYCODONE HCL 5 MG PO TABS: 5.0000 mg | ORAL_TABLET | Freq: Four times a day (QID) | ORAL | 0 refills | Status: DC | PRN

## 2017-08-07 MED ORDER — FENTANYL CITRATE (PF) 100 MCG/2ML IJ SOLN
INTRAMUSCULAR | Status: DC | PRN
Start: 2017-08-07 — End: 2017-08-07
  Administered 2017-08-07 (×4): 50 ug via INTRAVENOUS

## 2017-08-07 MED ORDER — HEMOSTATIC AGENTS (NO CHARGE) OPTIME
TOPICAL | Status: DC | PRN
  Administered 2017-08-07: 1 via TOPICAL

## 2017-08-07 MED ORDER — SODIUM CHLORIDE 0.9 % IV SOLN
INTRAVENOUS | Status: DC | PRN
  Administered 2017-08-07: 08:00:00

## 2017-08-07 MED ORDER — ROCURONIUM BROMIDE 100 MG/10ML IV SOLN
INTRAVENOUS | Status: DC | PRN
  Administered 2017-08-07: 20 mg via INTRAVENOUS

## 2017-08-07 MED ORDER — ARTIFICIAL TEARS OPHTHALMIC OINT
TOPICAL_OINTMENT | OPHTHALMIC | Status: DC | PRN
  Administered 2017-08-07: 1 via OPHTHALMIC

## 2017-08-07 MED ORDER — DEXAMETHASONE SODIUM PHOSPHATE 10 MG/ML IJ SOLN
INTRAMUSCULAR | Status: AC
  Filled 2017-08-07: qty 1

## 2017-08-07 MED ORDER — NEOSTIGMINE METHYLSULFATE 10 MG/10ML IV SOLN
INTRAVENOUS | Status: DC | PRN
  Administered 2017-08-07: 2 mg via INTRAVENOUS

## 2017-08-07 MED ORDER — HEPARIN SODIUM (PORCINE) 1000 UNIT/ML IJ SOLN
INTRAMUSCULAR | Status: DC | PRN
  Administered 2017-08-07: 5000 [IU] via INTRAVENOUS

## 2017-08-07 MED ORDER — MIDAZOLAM HCL 2 MG/2ML IJ SOLN
INTRAMUSCULAR | Status: AC
  Filled 2017-08-07: qty 2

## 2017-08-07 MED ORDER — ONDANSETRON HCL 4 MG/2ML IJ SOLN
INTRAMUSCULAR | Status: AC
  Filled 2017-08-07: qty 2

## 2017-08-07 MED ORDER — PHENYLEPHRINE HCL 10 MG/ML IJ SOLN
INTRAVENOUS | Status: DC | PRN
  Administered 2017-08-07: 30 ug/min via INTRAVENOUS
  Administered 2017-08-07: 40 ug/min via INTRAVENOUS

## 2017-08-07 MED ORDER — MIDAZOLAM HCL 5 MG/5ML IJ SOLN
INTRAMUSCULAR | Status: DC | PRN
  Administered 2017-08-07 (×2): 1 mg via INTRAVENOUS

## 2017-08-07 MED ORDER — SODIUM CHLORIDE 0.9 % IV SOLN
INTRAVENOUS | Status: DC
  Administered 2017-08-07 (×2): via INTRAVENOUS

## 2017-08-07 MED ORDER — FENTANYL CITRATE (PF) 250 MCG/5ML IJ SOLN
INTRAMUSCULAR | Status: AC
  Filled 2017-08-07: qty 5

## 2017-08-07 MED ORDER — LIDOCAINE HCL 1 % IJ SOLN
INTRAMUSCULAR | Status: AC
  Filled 2017-08-07: qty 40

## 2017-08-07 MED ORDER — PROTAMINE SULFATE 10 MG/ML IV SOLN
INTRAVENOUS | Status: AC
  Filled 2017-08-07: qty 5

## 2017-08-07 MED ORDER — PROPOFOL 10 MG/ML IV BOLUS
INTRAVENOUS | Status: AC
Start: 2017-08-07 — End: ?
  Filled 2017-08-07: qty 20

## 2017-08-07 MED ORDER — DEXTROSE 5 % IV SOLN
1.5000 g | INTRAVENOUS | Status: AC
  Administered 2017-08-07: 1.5 g via INTRAVENOUS
  Filled 2017-08-07: qty 1.5

## 2017-08-07 MED ORDER — LIDOCAINE 2% (20 MG/ML) 5 ML SYRINGE
INTRAMUSCULAR | Status: AC
  Filled 2017-08-07: qty 10

## 2017-08-07 MED ORDER — PROPOFOL 10 MG/ML IV BOLUS
INTRAVENOUS | Status: DC | PRN
  Administered 2017-08-07: 100 mg via INTRAVENOUS
  Administered 2017-08-07: 25 mg via INTRAVENOUS
  Administered 2017-08-07: 75 mg via INTRAVENOUS

## 2017-08-07 MED ORDER — HEPARIN SODIUM (PORCINE) 1000 UNIT/ML IJ SOLN
INTRAMUSCULAR | Status: AC
  Filled 2017-08-07: qty 1

## 2017-08-07 MED ORDER — PROTAMINE SULFATE 10 MG/ML IV SOLN
INTRAVENOUS | Status: DC | PRN
  Administered 2017-08-07: 50 mg via INTRAVENOUS

## 2017-08-07 MED ORDER — 0.9 % SODIUM CHLORIDE (POUR BTL) OPTIME
TOPICAL | Status: DC | PRN
  Administered 2017-08-07: 1000 mL

## 2017-08-07 SURGICAL SUPPLY — 49 items
ADH SKN CLS APL DERMABOND .7 (GAUZE/BANDAGES/DRESSINGS) ×1
AGENT HMST SPONGE THK3/8 (HEMOSTASIS) ×1
ARMBAND PINK RESTRICT EXTREMIT (MISCELLANEOUS) ×2 IMPLANT
BANDAGE ELASTIC 4 VELCRO ST LF (GAUZE/BANDAGES/DRESSINGS) ×1 IMPLANT
BNDG GAUZE ELAST 4 BULKY (GAUZE/BANDAGES/DRESSINGS) ×1 IMPLANT
CANISTER SUCT 3000ML PPV (MISCELLANEOUS) ×2 IMPLANT
CANNULA VESSEL 3MM 2 BLNT TIP (CANNULA) ×2 IMPLANT
CLIP VESOCCLUDE MED 24/CT (CLIP) ×2 IMPLANT
CLIP VESOCCLUDE SM WIDE 24/CT (CLIP) ×2 IMPLANT
COVER PROBE W GEL 5X96 (DRAPES) IMPLANT
DECANTER SPIKE VIAL GLASS SM (MISCELLANEOUS) ×1 IMPLANT
DERMABOND ADVANCED (GAUZE/BANDAGES/DRESSINGS) ×1
DERMABOND ADVANCED .7 DNX12 (GAUZE/BANDAGES/DRESSINGS) ×1 IMPLANT
DRAIN PENROSE 1/4X12 LTX STRL (WOUND CARE) IMPLANT
ELECT REM PT RETURN 9FT ADLT (ELECTROSURGICAL) ×2
ELECTRODE REM PT RTRN 9FT ADLT (ELECTROSURGICAL) ×1 IMPLANT
GAUZE SPONGE 4X4 12PLY STRL (GAUZE/BANDAGES/DRESSINGS) ×1 IMPLANT
GAUZE SPONGE 4X4 16PLY XRAY LF (GAUZE/BANDAGES/DRESSINGS) ×1 IMPLANT
GLOVE BIO SURGEON STRL SZ7.5 (GLOVE) ×1 IMPLANT
GLOVE BIOGEL PI IND STRL 8.5 (GLOVE) IMPLANT
GLOVE BIOGEL PI INDICATOR 8.5 (GLOVE) ×1
GLOVE SURG SS PI 7.0 STRL IVOR (GLOVE) ×1 IMPLANT
GLOVE SURG SS PI 7.5 STRL IVOR (GLOVE) ×2 IMPLANT
GLOVE SURG SS PI 8.0 STRL IVOR (GLOVE) ×1 IMPLANT
GLOVE SURG SS PI 8.5 STRL IVOR (GLOVE)
GLOVE SURG SS PI 8.5 STRL STRW (GLOVE) IMPLANT
GOWN STRL REUS W/ TWL LRG LVL3 (GOWN DISPOSABLE) ×3 IMPLANT
GOWN STRL REUS W/ TWL XL LVL3 (GOWN DISPOSABLE) IMPLANT
GOWN STRL REUS W/TWL LRG LVL3 (GOWN DISPOSABLE) ×2
GOWN STRL REUS W/TWL XL LVL3 (GOWN DISPOSABLE) ×6
HEMOSTAT SPONGE AVITENE ULTRA (HEMOSTASIS) ×1 IMPLANT
KIT BASIN OR (CUSTOM PROCEDURE TRAY) ×2 IMPLANT
KIT ROOM TURNOVER OR (KITS) ×2 IMPLANT
LOOP VESSEL MINI RED (MISCELLANEOUS) IMPLANT
NS IRRIG 1000ML POUR BTL (IV SOLUTION) ×2 IMPLANT
PACK CV ACCESS (CUSTOM PROCEDURE TRAY) ×2 IMPLANT
PAD ARMBOARD 7.5X6 YLW CONV (MISCELLANEOUS) ×4 IMPLANT
SPONGE LAP 18X18 X RAY DECT (DISPOSABLE) ×1 IMPLANT
SUT PROLENE 7 0 BV 1 (SUTURE) ×3 IMPLANT
SUT SILK 2 0 SH (SUTURE) IMPLANT
SUT SILK 3 0 (SUTURE) ×2
SUT SILK 3-0 18XBRD TIE 12 (SUTURE) IMPLANT
SUT VIC AB 3-0 SH 27 (SUTURE) ×10
SUT VIC AB 3-0 SH 27X BRD (SUTURE) ×1 IMPLANT
SUT VIC AB 4-0 PS2 27 (SUTURE) ×4 IMPLANT
SUT VICRYL 4-0 PS2 18IN ABS (SUTURE) ×2 IMPLANT
TAPE UMBILICAL COTTON 1/8X30 (MISCELLANEOUS) ×1 IMPLANT
UNDERPAD 30X30 (UNDERPADS AND DIAPERS) ×2 IMPLANT
WATER STERILE IRR 1000ML POUR (IV SOLUTION) ×2 IMPLANT

## 2017-08-07 NOTE — Telephone Encounter (Signed)
Sched appt 09/27/17; lab at 11:00 and MD at 11:45. Lm on hm#.

## 2017-08-07 NOTE — Anesthesia Preprocedure Evaluation (Addendum)
Anesthesia Evaluation    Reviewed: Allergy & Precautions, Patient's Chart, lab work & pertinent test results  Airway Mallampati: II  TM Distance: >3 FB Neck ROM: Full    Dental  (+) Teeth Intact, Dental Advisory Given   Pulmonary sleep apnea , pneumonia,    breath sounds clear to auscultation       Cardiovascular hypertension, + Peripheral Vascular Disease  negative cardio ROS   Rhythm:Regular Rate:Normal     Neuro/Psych PSYCHIATRIC DISORDERS negative neurological ROS     GI/Hepatic negative GI ROS, Neg liver ROS,   Endo/Other  negative endocrine ROS  Renal/GU ESRFRenal disease     Musculoskeletal  (+) Arthritis ,   Abdominal   Peds  Hematology negative hematology ROS (+)   Anesthesia Other Findings Day of surgery medications reviewed with the patient.  Reproductive/Obstetrics                             Anesthesia Physical Anesthesia Plan  ASA: III  Anesthesia Plan: General   Post-op Pain Management:    Induction: Intravenous  PONV Risk Score and Plan: 4 or greater and Ondansetron, Dexamethasone, Midazolam and Treatment may vary due to age or medical condition  Airway Management Planned: Oral ETT  Additional Equipment:   Intra-op Plan:   Post-operative Plan: Extubation in OR  Informed Consent: I have reviewed the patients History and Physical, chart, labs and discussed the procedure including the risks, benefits and alternatives for the proposed anesthesia with the patient or authorized representative who has indicated his/her understanding and acceptance.   Dental advisory given  Plan Discussed with: CRNA  Anesthesia Plan Comments:        Anesthesia Quick Evaluation

## 2017-08-07 NOTE — Anesthesia Preprocedure Evaluation (Addendum)
Anesthesia Evaluation  Patient identified by MRN, date of birth, ID band Patient awake    Reviewed: Allergy & Precautions, NPO status , Patient's Chart, lab work & pertinent test results  History of Anesthesia Complications Negative for: history of anesthetic complications  Airway Mallampati: II  TM Distance: >3 FB Neck ROM: Full    Dental  (+) Teeth Intact   Pulmonary    Pulmonary exam normal        Cardiovascular Exercise Tolerance: Good hypertension, Pt. on medications  Rhythm:Regular Rate:Normal  07/2015 LVEF  65-70%   Neuro/Psych    GI/Hepatic (+) Cirrhosis       ,   Endo/Other    Renal/GU ESRF and DialysisRenal disease     Musculoskeletal   Abdominal (+)  Abdomen: soft.    Peds  Hematology   Anesthesia Other Findings   Reproductive/Obstetrics                            Anesthesia Physical Anesthesia Plan  ASA: III  Anesthesia Plan: General   Post-op Pain Management:    Induction: Intravenous  PONV Risk Score and Plan: 1 and Ondansetron and Dexamethasone  Airway Management Planned: Oral ETT  Additional Equipment:   Intra-op Plan:   Post-operative Plan: Extubation in OR  Informed Consent:   Dental advisory given  Plan Discussed with: CRNA, Anesthesiologist and Surgeon  Anesthesia Plan Comments:         Anesthesia Quick Evaluation

## 2017-08-07 NOTE — H&P (View-Only) (Signed)
Patient is a 61 year old female who returns for follow-up today. She had a first stage basilic vein fistula placed 05/22/2017. She is currently dialyzing via a right-sided catheter on Monday Wednesday Friday.  She does complain of some numbness and tingling occasionally in her left hand. She is able to move the hand around some and then her symptoms resolved. Although she states the symptoms are a little bit of a nuisance they are tolerable. She would like to get rid of her catheter. Her nephrologist is Dr. Hinda Lenis.  Review of systems: She denies shortness of breath. She denies chest pain.  Current Outpatient Prescriptions on File Prior to Visit  Medication Sig Dispense Refill  . acetaminophen (TYLENOL) 325 MG tablet Take 650 mg by mouth every 6 (six) hours as needed.    . GENERLAC 10 GM/15ML SOLN Take 45 mLs by mouth 2 (two) times daily.  0  . lactulose (CHRONULAC) 10 GM/15ML solution Take 45 mLs (30 g total) by mouth 2 (two) times daily. Goal of 3 bowel movements per day 240 mL 0  . midodrine (PROAMATINE) 5 MG tablet Take 5 mg by mouth 3 (three) times daily with meals. Mon/Wed/Fri (dialysis days)    . rifaximin (XIFAXAN) 550 MG TABS tablet Take 1 tablet (550 mg total) by mouth 2 (two) times daily. 42 tablet 5   No current facility-administered medications on file prior to visit.     Vitals:   07/12/17 0914  BP: 98/67  Pulse: 63  Temp: (!) 97 F (36.1 C)  SpO2: 100%  Weight: 161 lb (73 kg)  Height: 5\' 2"  (1.575 m)    Extremities: Left upper extremity no palpable radial or ulnar pulse palpable thrill in fistula audible bruit well-healed antecubital incision  Data: Patient had a duplex ultrasound of her AV fistula today. Fistula diameter is 6-7 mm.  Assessment: Maturing left basilic vein fistula needs second stage procedure. Patient has mild steal symptoms but thinks that these are tolerable.  Plan: We'll schedule for second stage procedure In the near future if patient decides to  proceed. She is going to discuss this further with her husband. She will call us soon.  Ruta Hinds, MD [facility] Office: [phone] Pager: [phone]

## 2017-08-07 NOTE — Transfer of Care (Signed)
Immediate Anesthesia Transfer of Care Note  Patient: KRISHNA HEUER  Procedure(s) Performed: BASILIC VEIN TRANSPOSITION SECOND STAGE (Left )  Patient Location: PACU  Anesthesia Type:General  Level of Consciousness: awake, sedated, drowsy and patient cooperative  Airway & Oxygen Therapy: Patient Spontanous Breathing and Patient connected to nasal cannula oxygen  Post-op Assessment: Report given to RN, Post -op Vital signs reviewed and stable and Patient moving all extremities  Post vital signs: Reviewed and stable  Last Vitals:  Vitals:   08/07/17 0600 08/07/17 0602  BP:  (!) 97/49  Pulse: 75   Resp: 16   Temp: 36.7 C   SpO2: 100%     Last Pain:  Vitals:   08/07/17 0644  TempSrc:   PainSc: 0-No pain      Patients Stated Pain Goal: 3 (84/83/50 7573)  Complications: No apparent anesthesia complications

## 2017-08-07 NOTE — Interval H&P Note (Signed)
History and Physical Interval Note:  08/07/2017 7:22 AM  Savannah Jackson  has presented today for surgery, with the diagnosis of End Stage Renal Disease N18.6  The various methods of treatment have been discussed with the patient and family. After consideration of risks, benefits and other options for treatment, the patient has consented to  Procedure(s): BASILIC VEIN TRANSPOSITION SECOND STAGE (Left) as a surgical intervention .  The patient's history has been reviewed, patient examined, no change in status, stable for surgery.  I have reviewed the patient's chart and labs.  Questions were answered to the patient's satisfaction.     Ruta Hinds

## 2017-08-07 NOTE — Op Note (Signed)
Procedure: 2nd stage basilic vein transposition fistula Preop: ESRD Postop: ESRD Anesthesia: General Asst: Linus Orn Findings 6  Mm fistula  Operative details: After obtaining informed consent, the patient was taken to the operating room. The patient was placed in supine position on the operating room table. After administration of general anesthesia, the patient's entire left upper extremity was prepped and draped in usual sterile fashion. A longitudinal incision was made in the left antecubital area. Incision was carried down through the subcutaneous tissues down to the level of the preexisting basilic to brachial artery fistula. The vein was approximately 6 mm in diameter. The fistula was dissected free circumferentially and side branches ligated and divided between silk ties.  Three additional longitudinal incisions were made to dissect out the vein to the level of the axilla.   After the fistula was completely mobilized it was marked for orientation.  A subcutaneous tunnel was created over the biceps muscle connecting the axillary incision to the antecubital incision.  The patient was given 5000 units of intravenous heparin.  After 2 minutes of circulation, the fistula was clamped proximally and distally with fine bulldog clamps.  The fistula was divided adjacent to the anastomosis.  A thickened valve was debrided away and the vein was then brought through the tunnel and the vein sewn end to end using a running 7 0 prolene suture.  At completion it was forebled backbled and thoroughly flushed.  The anastomosis secured and clamps released with a palpable thrill immediately. There was some skin edge oozing but the the remainder of the incisions were fairly hemostatic.  The subcutaneous tissues were closed with multiple laters of 3 0 vicryl suture.  The skin incisions were closed with 4-0 Vicryl subcuticular stitch. Dermabond was applied to all incisions followed by kerlix ace wrap.  The patient  tolerated the procedure well and there were no complications. Instrument sponge and needle counts were correct at the end of the case. The patient was taken to the recovery room in stable condition. The patient had a palpable radial pulse at the end of the case.  Ruta Hinds, MD

## 2017-08-07 NOTE — Anesthesia Postprocedure Evaluation (Signed)
Anesthesia Post Note  Patient: Savannah Jackson  Procedure(s) Performed: BASILIC VEIN TRANSPOSITION SECOND STAGE (Left )     Patient location during evaluation: PACU Anesthesia Type: General Level of consciousness: awake and alert, patient cooperative and oriented Pain management: pain level controlled Vital Signs Assessment: post-procedure vital signs reviewed and stable Respiratory status: spontaneous breathing, nonlabored ventilation, respiratory function stable and patient connected to nasal cannula oxygen Cardiovascular status: blood pressure returned to baseline and stable Postop Assessment: no apparent nausea or vomiting Anesthetic complications: no    Last Vitals:  Vitals:   08/07/17 1105 08/07/17 1113  BP:  110/70  Pulse: 71 72  Resp: 17   Temp:    SpO2: 94% 97%    Last Pain:  Vitals:   08/07/17 1113  TempSrc:   PainSc: 0-No pain                 Blane Worthington,E. Neema Barreira

## 2017-08-07 NOTE — Anesthesia Procedure Notes (Signed)
Procedure Name: Intubation Date/Time: 08/07/2017 7:45 AM Performed by: Izora Gala Pre-anesthesia Checklist: Patient identified, Emergency Drugs available, Suction available and Patient being monitored Patient Re-evaluated:Patient Re-evaluated prior to induction Oxygen Delivery Method: Circle system utilized Preoxygenation: Pre-oxygenation with 100% oxygen Induction Type: IV induction Ventilation: Mask ventilation without difficulty Laryngoscope Size: Miller and 3 Grade View: Grade I Tube type: Oral Tube size: 7.0 mm Number of attempts: 1 Airway Equipment and Method: Stylet Placement Confirmation: ETT inserted through vocal cords under direct vision,  positive ETCO2 and breath sounds checked- equal and bilateral

## 2017-08-07 NOTE — Telephone Encounter (Signed)
-----   Message from Mena Goes, RN sent at 08/07/2017 10:52 AM EDT ----- Regarding: 4-6 weeks w/ fistula duplex   ----- Message ----- From: Elam Dutch, MD Sent: 08/07/2017  10:07 AM To: Vvs Charge Pool  2nd stage basilic.  She needs follow up with me in 4-6 weeks with duplex of fistula  Ruta Hinds

## 2017-08-08 ENCOUNTER — Other Ambulatory Visit: Payer: Self-pay

## 2017-08-08 ENCOUNTER — Encounter (HOSPITAL_COMMUNITY): Payer: Self-pay | Admitting: Vascular Surgery

## 2017-08-08 DIAGNOSIS — Z48812 Encounter for surgical aftercare following surgery on the circulatory system: Secondary | ICD-10-CM

## 2017-08-08 DIAGNOSIS — Z992 Dependence on renal dialysis: Secondary | ICD-10-CM | POA: Diagnosis not present

## 2017-08-08 DIAGNOSIS — N186 End stage renal disease: Secondary | ICD-10-CM | POA: Diagnosis not present

## 2017-08-09 DIAGNOSIS — K7290 Hepatic failure, unspecified without coma: Secondary | ICD-10-CM | POA: Diagnosis not present

## 2017-08-10 DIAGNOSIS — Z992 Dependence on renal dialysis: Secondary | ICD-10-CM | POA: Diagnosis not present

## 2017-08-10 DIAGNOSIS — N186 End stage renal disease: Secondary | ICD-10-CM | POA: Diagnosis not present

## 2017-08-13 ENCOUNTER — Encounter: Payer: Self-pay | Admitting: Interventional Radiology

## 2017-08-13 DIAGNOSIS — N186 End stage renal disease: Secondary | ICD-10-CM | POA: Diagnosis not present

## 2017-08-13 DIAGNOSIS — Z992 Dependence on renal dialysis: Secondary | ICD-10-CM | POA: Diagnosis not present

## 2017-08-14 DIAGNOSIS — K7290 Hepatic failure, unspecified without coma: Secondary | ICD-10-CM | POA: Diagnosis not present

## 2017-08-15 DIAGNOSIS — Z992 Dependence on renal dialysis: Secondary | ICD-10-CM | POA: Diagnosis not present

## 2017-08-15 DIAGNOSIS — N186 End stage renal disease: Secondary | ICD-10-CM | POA: Diagnosis not present

## 2017-08-17 DIAGNOSIS — N186 End stage renal disease: Secondary | ICD-10-CM | POA: Diagnosis not present

## 2017-08-17 DIAGNOSIS — Z992 Dependence on renal dialysis: Secondary | ICD-10-CM | POA: Diagnosis not present

## 2017-08-20 DIAGNOSIS — Z992 Dependence on renal dialysis: Secondary | ICD-10-CM | POA: Diagnosis not present

## 2017-08-20 DIAGNOSIS — N186 End stage renal disease: Secondary | ICD-10-CM | POA: Diagnosis not present

## 2017-08-21 DIAGNOSIS — K7290 Hepatic failure, unspecified without coma: Secondary | ICD-10-CM | POA: Diagnosis not present

## 2017-08-22 DIAGNOSIS — Z992 Dependence on renal dialysis: Secondary | ICD-10-CM | POA: Diagnosis not present

## 2017-08-22 DIAGNOSIS — N186 End stage renal disease: Secondary | ICD-10-CM | POA: Diagnosis not present

## 2017-08-24 DIAGNOSIS — N186 End stage renal disease: Secondary | ICD-10-CM | POA: Diagnosis not present

## 2017-08-24 DIAGNOSIS — Z992 Dependence on renal dialysis: Secondary | ICD-10-CM | POA: Diagnosis not present

## 2017-08-27 DIAGNOSIS — Z992 Dependence on renal dialysis: Secondary | ICD-10-CM | POA: Diagnosis not present

## 2017-08-27 DIAGNOSIS — N186 End stage renal disease: Secondary | ICD-10-CM | POA: Diagnosis not present

## 2017-08-28 DIAGNOSIS — N186 End stage renal disease: Secondary | ICD-10-CM | POA: Diagnosis not present

## 2017-08-28 DIAGNOSIS — Z992 Dependence on renal dialysis: Secondary | ICD-10-CM | POA: Diagnosis not present

## 2017-08-28 DIAGNOSIS — K7290 Hepatic failure, unspecified without coma: Secondary | ICD-10-CM | POA: Diagnosis not present

## 2017-08-29 DIAGNOSIS — Z992 Dependence on renal dialysis: Secondary | ICD-10-CM | POA: Diagnosis not present

## 2017-08-29 DIAGNOSIS — N186 End stage renal disease: Secondary | ICD-10-CM | POA: Diagnosis not present

## 2017-08-31 DIAGNOSIS — N186 End stage renal disease: Secondary | ICD-10-CM | POA: Diagnosis not present

## 2017-08-31 DIAGNOSIS — Z992 Dependence on renal dialysis: Secondary | ICD-10-CM | POA: Diagnosis not present

## 2017-09-03 ENCOUNTER — Ambulatory Visit
Admission: RE | Admit: 2017-09-03 | Discharge: 2017-09-03 | Payer: MEDICARE | Attending: Nephrology | Admitting: Nephrology

## 2017-09-03 DIAGNOSIS — Z992 Dependence on renal dialysis: Secondary | ICD-10-CM | POA: Diagnosis not present

## 2017-09-03 DIAGNOSIS — N186 End stage renal disease: Secondary | ICD-10-CM | POA: Diagnosis not present

## 2017-09-04 DIAGNOSIS — K7290 Hepatic failure, unspecified without coma: Secondary | ICD-10-CM | POA: Diagnosis not present

## 2017-09-05 DIAGNOSIS — Z992 Dependence on renal dialysis: Secondary | ICD-10-CM | POA: Diagnosis not present

## 2017-09-05 DIAGNOSIS — N186 End stage renal disease: Secondary | ICD-10-CM | POA: Diagnosis not present

## 2017-09-07 DIAGNOSIS — Z992 Dependence on renal dialysis: Secondary | ICD-10-CM | POA: Diagnosis not present

## 2017-09-07 DIAGNOSIS — N186 End stage renal disease: Secondary | ICD-10-CM | POA: Diagnosis not present

## 2017-09-07 DIAGNOSIS — Z01818 Encounter for other preprocedural examination: Secondary | ICD-10-CM

## 2017-09-07 DIAGNOSIS — Z7682 Awaiting organ transplant status: Principal | ICD-10-CM

## 2017-09-10 DIAGNOSIS — Z992 Dependence on renal dialysis: Secondary | ICD-10-CM | POA: Diagnosis not present

## 2017-09-10 DIAGNOSIS — N186 End stage renal disease: Secondary | ICD-10-CM | POA: Diagnosis not present

## 2017-09-11 DIAGNOSIS — K7290 Hepatic failure, unspecified without coma: Secondary | ICD-10-CM | POA: Diagnosis not present

## 2017-09-12 ENCOUNTER — Inpatient Hospital Stay
Admission: EM | Admit: 2017-09-12 | Discharge: 2017-09-25 | Disposition: A | Discharge status: Home / Self Care | Source: Assisted Living Facility

## 2017-09-12 ENCOUNTER — Inpatient Hospital Stay
Admission: EM | Admit: 2017-09-12 | Discharge: 2017-09-25 | Disposition: A | Discharge status: Home / Self Care | Payer: MEDICARE | Source: Assisted Living Facility

## 2017-09-12 ENCOUNTER — Inpatient Hospital Stay
Admission: EM | Admit: 2017-09-12 | Discharge: 2017-09-25 | Disposition: A | Discharge status: Home / Self Care | Source: Assisted Living Facility | Attending: Surgery | Admitting: Surgery

## 2017-09-12 DIAGNOSIS — J948 Other specified pleural conditions: Secondary | ICD-10-CM | POA: Diagnosis not present

## 2017-09-12 DIAGNOSIS — F1021 Alcohol dependence, in remission: Secondary | ICD-10-CM | POA: Diagnosis not present

## 2017-09-12 DIAGNOSIS — Z01818 Encounter for other preprocedural examination: Secondary | ICD-10-CM | POA: Diagnosis not present

## 2017-09-12 DIAGNOSIS — Z992 Dependence on renal dialysis: Secondary | ICD-10-CM | POA: Diagnosis not present

## 2017-09-12 DIAGNOSIS — K228 Other specified diseases of esophagus: Secondary | ICD-10-CM | POA: Diagnosis not present

## 2017-09-12 DIAGNOSIS — J9811 Atelectasis: Secondary | ICD-10-CM | POA: Diagnosis not present

## 2017-09-12 DIAGNOSIS — Z9889 Other specified postprocedural states: Secondary | ICD-10-CM | POA: Diagnosis not present

## 2017-09-12 DIAGNOSIS — Z79899 Other long term (current) drug therapy: Secondary | ICD-10-CM | POA: Diagnosis not present

## 2017-09-12 DIAGNOSIS — J810 Acute pulmonary edema: Secondary | ICD-10-CM | POA: Diagnosis not present

## 2017-09-12 DIAGNOSIS — D689 Coagulation defect, unspecified: Secondary | ICD-10-CM | POA: Diagnosis not present

## 2017-09-12 DIAGNOSIS — J90 Pleural effusion, not elsewhere classified: Secondary | ICD-10-CM | POA: Diagnosis not present

## 2017-09-12 DIAGNOSIS — F4321 Adjustment disorder with depressed mood: Secondary | ICD-10-CM | POA: Diagnosis not present

## 2017-09-12 DIAGNOSIS — Z4682 Encounter for fitting and adjustment of non-vascular catheter: Secondary | ICD-10-CM | POA: Diagnosis not present

## 2017-09-12 DIAGNOSIS — Z01810 Encounter for preprocedural cardiovascular examination: Secondary | ICD-10-CM | POA: Diagnosis not present

## 2017-09-12 DIAGNOSIS — Z4822 Encounter for aftercare following kidney transplant: Secondary | ICD-10-CM | POA: Diagnosis not present

## 2017-09-12 DIAGNOSIS — N179 Acute kidney failure, unspecified: Secondary | ICD-10-CM | POA: Diagnosis not present

## 2017-09-12 DIAGNOSIS — R0989 Other specified symptoms and signs involving the circulatory and respiratory systems: Secondary | ICD-10-CM | POA: Diagnosis not present

## 2017-09-12 DIAGNOSIS — Z940 Kidney transplant status: Secondary | ICD-10-CM | POA: Diagnosis not present

## 2017-09-12 DIAGNOSIS — R188 Other ascites: Secondary | ICD-10-CM | POA: Diagnosis not present

## 2017-09-12 DIAGNOSIS — Z9911 Dependence on respirator [ventilator] status: Secondary | ICD-10-CM | POA: Diagnosis not present

## 2017-09-12 DIAGNOSIS — J9809 Other diseases of bronchus, not elsewhere classified: Secondary | ICD-10-CM | POA: Diagnosis not present

## 2017-09-12 DIAGNOSIS — I771 Stricture of artery: Secondary | ICD-10-CM | POA: Diagnosis not present

## 2017-09-12 DIAGNOSIS — R9431 Abnormal electrocardiogram [ECG] [EKG]: Secondary | ICD-10-CM | POA: Diagnosis not present

## 2017-09-12 DIAGNOSIS — R740 Nonspecific elevation of levels of transaminase and lactic acid dehydrogenase [LDH]: Secondary | ICD-10-CM | POA: Diagnosis not present

## 2017-09-12 DIAGNOSIS — N186 End stage renal disease: Secondary | ICD-10-CM | POA: Diagnosis not present

## 2017-09-12 DIAGNOSIS — G9349 Other encephalopathy: Secondary | ICD-10-CM | POA: Diagnosis not present

## 2017-09-12 DIAGNOSIS — J982 Interstitial emphysema: Secondary | ICD-10-CM | POA: Diagnosis not present

## 2017-09-12 DIAGNOSIS — Z944 Liver transplant status: Secondary | ICD-10-CM | POA: Diagnosis not present

## 2017-09-12 DIAGNOSIS — K7030 Alcoholic cirrhosis of liver without ascites: Secondary | ICD-10-CM | POA: Diagnosis not present

## 2017-09-12 DIAGNOSIS — K7031 Alcoholic cirrhosis of liver with ascites: Secondary | ICD-10-CM | POA: Diagnosis not present

## 2017-09-12 DIAGNOSIS — E872 Acidosis: Secondary | ICD-10-CM | POA: Diagnosis not present

## 2017-09-12 DIAGNOSIS — I8511 Secondary esophageal varices with bleeding: Secondary | ICD-10-CM | POA: Diagnosis not present

## 2017-09-12 DIAGNOSIS — Z452 Encounter for adjustment and management of vascular access device: Secondary | ICD-10-CM | POA: Diagnosis not present

## 2017-09-12 DIAGNOSIS — I959 Hypotension, unspecified: Secondary | ICD-10-CM | POA: Diagnosis not present

## 2017-09-12 DIAGNOSIS — R0689 Other abnormalities of breathing: Secondary | ICD-10-CM | POA: Diagnosis not present

## 2017-09-12 DIAGNOSIS — R638 Other symptoms and signs concerning food and fluid intake: Secondary | ICD-10-CM | POA: Diagnosis not present

## 2017-09-12 DIAGNOSIS — Z4823 Encounter for aftercare following liver transplant: Secondary | ICD-10-CM | POA: Diagnosis not present

## 2017-09-12 DIAGNOSIS — J95821 Acute postprocedural respiratory failure: Secondary | ICD-10-CM | POA: Diagnosis not present

## 2017-09-12 DIAGNOSIS — D62 Acute posthemorrhagic anemia: Secondary | ICD-10-CM | POA: Diagnosis not present

## 2017-09-12 DIAGNOSIS — R918 Other nonspecific abnormal finding of lung field: Secondary | ICD-10-CM | POA: Diagnosis not present

## 2017-09-12 DIAGNOSIS — Z5181 Encounter for therapeutic drug level monitoring: Secondary | ICD-10-CM | POA: Diagnosis not present

## 2017-09-12 DIAGNOSIS — I120 Hypertensive chronic kidney disease with stage 5 chronic kidney disease or end stage renal disease: Secondary | ICD-10-CM | POA: Diagnosis not present

## 2017-09-13 ENCOUNTER — Ambulatory Visit
Admission: RE | Admit: 2017-09-13 | Discharge: 2017-09-13 | Disposition: A | Discharge status: Home / Self Care | Admitting: Surgery

## 2017-09-13 DIAGNOSIS — K7031 Alcoholic cirrhosis of liver with ascites: Principal | ICD-10-CM

## 2017-09-13 DIAGNOSIS — Z940 Kidney transplant status: Principal | ICD-10-CM

## 2017-09-13 DIAGNOSIS — Z944 Liver transplant status: Secondary | ICD-10-CM

## 2017-09-14 ENCOUNTER — Other Ambulatory Visit: Payer: Self-pay | Admitting: *Deleted

## 2017-09-14 DIAGNOSIS — Z940 Kidney transplant status: Secondary | ICD-10-CM | POA: Insufficient documentation

## 2017-09-14 DIAGNOSIS — Z944 Liver transplant status: Secondary | ICD-10-CM | POA: Insufficient documentation

## 2017-09-14 DIAGNOSIS — K7031 Alcoholic cirrhosis of liver with ascites: Principal | ICD-10-CM

## 2017-09-14 NOTE — Patient Outreach (Signed)
[place] [facility]) Care Management  09/14/2017  Savannah Jackson 07-05-1956 [id number]  Referral via [employer]:  Per hx review-dx End stage renal disease, End stage liver disease; on transplant list & follows with transplant hepatology at [facility].  Call #1 to patient; left HIPPA compliant voice mail requested call back.  Plan: Will follow up.  Sherrin Daisy, RN BSN [place] Management Coordinator [facility] Care Management  [phone]

## 2017-09-15 DIAGNOSIS — K7031 Alcoholic cirrhosis of liver with ascites: Principal | ICD-10-CM

## 2017-09-17 DIAGNOSIS — K7031 Alcoholic cirrhosis of liver with ascites: Principal | ICD-10-CM

## 2017-09-17 MED ORDER — MYFORTIC 180 MG TABLET,DELAYED RELEASE
ORAL_TABLET | Freq: Two times a day (BID) | ORAL | 11 refills | 0.00000 days | Status: CP
Start: 2017-09-17 — End: 2017-09-25

## 2017-09-17 MED ORDER — VALGANCICLOVIR 450 MG TABLET
ORAL_TABLET | Freq: Every day | ORAL | 2 refills | 0.00000 days | Status: CP
Start: 2017-09-17 — End: 2017-11-05

## 2017-09-17 MED ORDER — PROGRAF 1 MG CAPSULE: 5 mg | capsule | Freq: Two times a day (BID) | 11 refills | 0 days | Status: AC

## 2017-09-17 MED ORDER — MYFORTIC 180 MG TABLET,DELAYED RELEASE: 540 mg | tablet | Freq: Two times a day (BID) | 11 refills | 0 days | Status: AC

## 2017-09-17 MED ORDER — PROGRAF 1 MG CAPSULE
ORAL_CAPSULE | Freq: Two times a day (BID) | ORAL | 11 refills | 0.00000 days | Status: CP
Start: 2017-09-17 — End: 2017-09-25

## 2017-09-17 MED ORDER — VALGANCICLOVIR 450 MG TABLET: 450 mg | tablet | 2 refills | 0 days

## 2017-09-18 ENCOUNTER — Encounter: Payer: Self-pay | Admitting: *Deleted

## 2017-09-18 NOTE — Unmapped (Signed)
Beatrice Lecher RN-BSN,  [facility] - Liver Transplant Coordinator  [facility]  [address], [place], [place] [address]  Phone: [phone] - Fax: [phone]  [email or web address]

## 2017-09-18 NOTE — Telephone Encounter (Signed)
This encounter was created in error - please disregard.

## 2017-09-19 ENCOUNTER — Other Ambulatory Visit: Payer: Self-pay | Admitting: *Deleted

## 2017-09-19 DIAGNOSIS — K7031 Alcoholic cirrhosis of liver with ascites: Principal | ICD-10-CM

## 2017-09-19 NOTE — Patient Outreach (Signed)
[place] [facility]) Care Management  09/19/2017  EVERETT RICCIARDELLI [birth year] [id number]  Referral via [place]:  Per hx review-dx End stage renal disease, End stage liver disease; on transplant list & follows with transplant hepatology at [facility].  Call #2-left message on voice mail requesting call back.  Plan: Will follow up.  Sherrin Daisy, RN BSN [place] Management Coordinator [facility] Care Management  [phone]

## 2017-09-21 DIAGNOSIS — K7031 Alcoholic cirrhosis of liver with ascites: Principal | ICD-10-CM

## 2017-09-24 DIAGNOSIS — K7031 Alcoholic cirrhosis of liver with ascites: Principal | ICD-10-CM

## 2017-09-24 MED ORDER — TACROLIMUS 1 MG CAPSULE: 5 mg | each | 11 refills | 0 days

## 2017-09-24 MED ORDER — TACROLIMUS 1 MG CAPSULE
ORAL_CAPSULE | Freq: Two times a day (BID) | ORAL | 11 refills | 0.00000 days | Status: CP
Start: 2017-09-24 — End: 2017-09-25

## 2017-09-24 MED ORDER — MYCOPHENOLATE SODIUM 180 MG TABLET,DELAYED RELEASE: 540 mg | tablet | Freq: Two times a day (BID) | 11 refills | 0 days | Status: AC

## 2017-09-24 MED ORDER — MYCOPHENOLATE SODIUM 180 MG TABLET,DELAYED RELEASE
Freq: Two times a day (BID) | ORAL | 11 refills | 0.00000 days | Status: CP
Start: 2017-09-24 — End: 2017-09-24

## 2017-09-25 ENCOUNTER — Ambulatory Visit: Payer: Self-pay | Admitting: *Deleted

## 2017-09-25 MED ORDER — ACETAMINOPHEN 325 MG TABLET
ORAL_TABLET | Freq: Four times a day (QID) | ORAL | 0 refills | 0.00000 days | Status: CP | PRN
Start: 2017-09-25 — End: 2017-09-25

## 2017-09-25 MED ORDER — ASPIRIN 81 MG TABLET,DELAYED RELEASE
ORAL_TABLET | Freq: Every day | ORAL | 0 refills | 0.00000 days | Status: CP
Start: 2017-09-25 — End: 2017-10-17

## 2017-09-25 MED ORDER — OXYCODONE 5 MG TABLET
ORAL_TABLET | ORAL | 0 refills | 0 days | Status: CP | PRN
Start: 2017-09-25 — End: 2017-10-02

## 2017-09-25 MED ORDER — PANTOPRAZOLE 40 MG TABLET,DELAYED RELEASE
ORAL_TABLET | Freq: Every day | ORAL | 0 refills | 0.00000 days | Status: CP
Start: 2017-09-25 — End: 2017-10-13

## 2017-09-25 MED ORDER — DOCUSATE SODIUM 100 MG CAPSULE: 100 mg | capsule | 0 refills | 0 days

## 2017-09-25 MED ORDER — PROGRAF 1 MG CAPSULE
ORAL_CAPSULE | 10 refills | 0 days
Start: 2017-09-25 — End: 2017-09-25

## 2017-09-25 MED ORDER — DOCUSATE SODIUM 100 MG CAPSULE
ORAL_CAPSULE | Freq: Two times a day (BID) | ORAL | 0 refills | 0.00000 days | Status: CP | PRN
Start: 2017-09-25 — End: 2017-10-17

## 2017-09-25 MED ORDER — ASPIRIN 81 MG TABLET,DELAYED RELEASE: each | 0 refills | 0 days

## 2017-09-25 MED ORDER — TACROLIMUS 1 MG CAPSULE
ORAL_CAPSULE | Freq: Two times a day (BID) | ORAL | 11 refills | 0.00000 days | Status: CP
Start: 2017-09-25 — End: 2017-10-01

## 2017-09-25 MED ORDER — ACETAMINOPHEN 325 MG TABLET: each | 0 refills | 0 days

## 2017-09-25 MED ORDER — POLYETHYLENE GLYCOL 3350 17 GRAM/DOSE ORAL POWDER: g | 0 refills | 0 days

## 2017-09-25 MED ORDER — SULFAMETHOXAZOLE 400 MG-TRIMETHOPRIM 80 MG TABLET: 1 | tablet | 5 refills | 0 days | Status: AC

## 2017-09-25 MED ORDER — MAGNESIUM OXIDE 400 MG (241.3 MG MAGNESIUM) TABLET
ORAL_TABLET | Freq: Two times a day (BID) | ORAL | 11 refills | 0.00000 days | Status: CP
Start: 2017-09-25 — End: 2017-10-17

## 2017-09-25 MED ORDER — PANTOPRAZOLE 40 MG TABLET,DELAYED RELEASE: 40 mg | tablet | Freq: Every day | 0 refills | 0 days | Status: AC

## 2017-09-25 MED ORDER — PREDNISONE 5 MG TABLET: 5 mg | tablet | 8 refills | 0 days

## 2017-09-25 MED ORDER — PREDNISONE 5 MG TABLET
ORAL_TABLET | Freq: Every day | ORAL | 8 refills | 0 days | Status: CP
Start: 2017-09-25 — End: 2017-09-25

## 2017-09-25 MED ORDER — POLYETHYLENE GLYCOL 3350 17 GRAM/DOSE ORAL POWDER
Freq: Every day | ORAL | 0 refills | 0.00000 days | Status: CP | PRN
Start: 2017-09-25 — End: 2017-09-25

## 2017-09-25 MED FILL — MAGNESIUM OXIDE/400MG/TABS: MAGNESIUM OXIDE/400MG/TABS | 60 days supply | Qty: 120 | Fill #0

## 2017-09-25 MED FILL — ACETAMINOPHEN 325MG GERI-CARE/325MG/TABS: ACETAMINOPHEN 325MG GERI-CARE/325MG/TABS | 13 days supply | Qty: 100 | Fill #0

## 2017-09-25 MED FILL — OXYCODONE HCL/5MG/TABS: OXYCODONE HCL/5MG/TABS | 5 days supply | Qty: 60 | Fill #0

## 2017-09-25 MED FILL — SULFAMETHOXAZOLE/TRIMETHO/400-80MG/TABS: SULFAMETHOXAZOLE/TRIMETHO/400-80MG/TABS | 28 days supply | Qty: 12 | Fill #0

## 2017-09-25 MED FILL — GLYCOLAX/3350 NF/POWD: GLYCOLAX/3350 NF/POWD | 15 days supply | Qty: 1 | Fill #0

## 2017-09-25 MED FILL — DOCUSATE/100MG/CAPS: DOCUSATE/100MG/CAPS | 30 days supply | Qty: 60 | Fill #0

## 2017-09-25 MED FILL — GNP ASPIRIN LOW DOSE/81MG EC/TBEC: GNP ASPIRIN LOW DOSE/81MG EC/TBEC | 30 days supply | Qty: 30 | Fill #0

## 2017-09-25 MED FILL — PANTOPRAZOLE/40MG/TBEC: PANTOPRAZOLE/40MG/TBEC | 30 days supply | Qty: 30 | Fill #0

## 2017-09-25 MED FILL — VALGANCICLOVIR/450MG/TABS: VALGANCICLOVIR/450MG/TABS | 30 days supply | Qty: 30 | Fill #0

## 2017-09-25 MED FILL — PROGRAF/1MG/CAP: PROGRAF/1MG/CAP | 30 days supply | Qty: 420 | Fill #0

## 2017-09-25 MED FILL — PREDNISONE/5MG/TAB: PREDNISONE/5MG/TAB | 30 days supply | Qty: 30 | Fill #0

## 2017-09-25 MED FILL — MYFORTIC/180MG/TAB: MYFORTIC/180MG/TAB | 30 days supply | Qty: 180 | Fill #0

## 2017-09-26 ENCOUNTER — Other Ambulatory Visit: Payer: Self-pay | Admitting: *Deleted

## 2017-09-26 ENCOUNTER — Encounter: Payer: Self-pay | Admitting: *Deleted

## 2017-09-26 DIAGNOSIS — N186 End stage renal disease: Secondary | ICD-10-CM | POA: Diagnosis not present

## 2017-09-26 DIAGNOSIS — N179 Acute kidney failure, unspecified: Secondary | ICD-10-CM | POA: Diagnosis not present

## 2017-09-26 DIAGNOSIS — N189 Chronic kidney disease, unspecified: Secondary | ICD-10-CM | POA: Diagnosis not present

## 2017-09-26 DIAGNOSIS — Z992 Dependence on renal dialysis: Secondary | ICD-10-CM | POA: Diagnosis not present

## 2017-09-26 DIAGNOSIS — Z940 Kidney transplant status: Secondary | ICD-10-CM | POA: Diagnosis not present

## 2017-09-26 DIAGNOSIS — Z944 Liver transplant status: Secondary | ICD-10-CM | POA: Diagnosis not present

## 2017-09-26 MED ORDER — SULFAMETHOXAZOLE 400 MG-TRIMETHOPRIM 80 MG TABLET
ORAL_TABLET | ORAL | 4 refills | 0.00000 days | Status: CP
Start: 2017-09-26 — End: 2017-09-26

## 2017-09-26 NOTE — Patient Outreach (Signed)
[place] [facility]) Care Management  09/26/2017  LYLIE BLACKLOCK [birth year] [id number]   Telephone call attempt x 3; left HIPPA compliant voice mail requesting return call.   Plan: Geophysicist/field seismologist. Will follow up in 10 business days.  Sherrin Daisy, RN BSN [place] Management Coordinator [facility] Care Management  [phone]

## 2017-09-27 ENCOUNTER — Encounter: Payer: Self-pay | Admitting: Vascular Surgery

## 2017-09-27 ENCOUNTER — Ambulatory Visit (HOSPITAL_COMMUNITY)
Admission: RE | Admit: 2017-09-27 | Discharge: 2017-09-27 | Disposition: A | Discharge status: Home / Self Care | Payer: Medicare HMO | Source: Ambulatory Visit | Attending: Vascular Surgery | Admitting: Vascular Surgery

## 2017-09-27 ENCOUNTER — Ambulatory Visit (INDEPENDENT_AMBULATORY_CARE_PROVIDER_SITE_OTHER): Payer: Self-pay | Admitting: Vascular Surgery

## 2017-09-27 VITALS — BP 100/69 | HR 98 | Temp 98.1°F | Resp 18 | Ht 66.0 in | Wt 167.1 lb

## 2017-09-27 DIAGNOSIS — R9439 Abnormal result of other cardiovascular function study: Secondary | ICD-10-CM | POA: Diagnosis not present

## 2017-09-27 DIAGNOSIS — Z48812 Encounter for surgical aftercare following surgery on the circulatory system: Secondary | ICD-10-CM | POA: Insufficient documentation

## 2017-09-27 DIAGNOSIS — Z992 Dependence on renal dialysis: Secondary | ICD-10-CM

## 2017-09-27 DIAGNOSIS — N186 End stage renal disease: Secondary | ICD-10-CM

## 2017-09-27 NOTE — Progress Notes (Signed)
Patient is a 61 year old female who returns for follow-up today after placement of a left basilic vein transposition fistula 05/22/2017 was her first stage for second stage was 08/17/2017. She currently is dialyzing via a catheter. She obtained a kidney and liver transplant last week. She states that currently the kidney is not functioning. She denies any numbness or tingling in her hand.  Physical exam:  Vitals:   09/27/17 1141  BP: 100/69  Pulse: 98  Resp: 18  Temp: 98.1 F (36.7 C)  TempSrc: Oral  SpO2: 98%  Weight: 167 lb 1.6 oz (75.8 kg)  Height: 5\' 6"  (1.676 m)    Extremities: Palpable thrill in fistula fistula is easily visible on the skin surface. The very data: Patient had a duplex ultrasound of her AV fistula today. This showed a possible narrowing in the mid upper arm. Fistula diameter was 8-10 mm. It was less than 6 mm from the skin throughout.  Assessment: Left arm AV fistula appears to be maturing on clinical exam. However, duplex ultrasound suggested possible narrowing near the distal portion of the fistula. Since the patient currently just received a kidney transplant and the kidney transplant has most likely ATN at this point. I believe the best option would be to wait for 4-6 weeks and then re-duplex her fistula. If the narrowing is still present we would consider a fistulogram and possible intervention at that point. Hopefully her kidney will have recovered enough at that point that we can give her contrast. If her kidney is still nonfunctioning that point we would have to consider other possible options.  Plan: The patient will follow-up in 6 weeks with repeat duplex ultrasound and consider fistulogram at that point if her fistula is not working well.  Ruta Hinds, MD [facility] Office: [phone] Pager: [phone]

## 2017-09-28 DIAGNOSIS — N186 End stage renal disease: Secondary | ICD-10-CM | POA: Diagnosis not present

## 2017-09-28 DIAGNOSIS — N179 Acute kidney failure, unspecified: Secondary | ICD-10-CM | POA: Diagnosis not present

## 2017-09-28 DIAGNOSIS — N189 Chronic kidney disease, unspecified: Secondary | ICD-10-CM | POA: Diagnosis not present

## 2017-09-28 DIAGNOSIS — Z944 Liver transplant status: Secondary | ICD-10-CM | POA: Diagnosis not present

## 2017-09-28 DIAGNOSIS — Z940 Kidney transplant status: Secondary | ICD-10-CM | POA: Diagnosis not present

## 2017-09-28 DIAGNOSIS — Z992 Dependence on renal dialysis: Secondary | ICD-10-CM | POA: Diagnosis not present

## 2017-10-01 DIAGNOSIS — N186 End stage renal disease: Secondary | ICD-10-CM | POA: Diagnosis not present

## 2017-10-01 DIAGNOSIS — Z944 Liver transplant status: Secondary | ICD-10-CM | POA: Diagnosis not present

## 2017-10-01 DIAGNOSIS — Z940 Kidney transplant status: Secondary | ICD-10-CM | POA: Diagnosis not present

## 2017-10-01 DIAGNOSIS — Z992 Dependence on renal dialysis: Secondary | ICD-10-CM | POA: Diagnosis not present

## 2017-10-01 LAB — TACROLIMUS BLOOD: Lab: 17.4

## 2017-10-01 MED ORDER — TACROLIMUS 1 MG CAPSULE
ORAL_CAPSULE | 11 refills | 0 days
Start: 2017-10-01 — End: 2017-10-01

## 2017-10-01 NOTE — Unmapped (Signed)
Spoke to [employer]  patient dialysis RN. Marland Kitchen Pt dry weight is 72 kg, her starting weight at dialysis today was 74.3 kg. I advised the RN, that for future treatment all her fluid should not be removed if she is not SOB, or has edema. 1.5L UF should be left for her transplant kidney to make urine.

## 2017-10-01 NOTE — Unmapped (Addendum)
Per Jennette Banker, PharmD's review of 11/30 tac level of 17.4 per RuthAnn patient to HOLD 12/3 PM and 12/4 AM dosing and then decrease prograf dosing to 5mg  BID beginning with PM dosing on 12/4.  Spoke with patient's daughter Helmut Muster to relay this order (she confirms labs were reflective of true trough levels) - she read back and verbalized understanding.  She confirms patient is aware she should have labs drawn tomorrow @ [facility] prior to provider appt.  Reminded her to bring orange card and pills with her to visit - she verbalized agreement.

## 2017-10-02 ENCOUNTER — Ambulatory Visit: Admission: RE | Admit: 2017-10-02 | Discharge: 2017-10-02 | Disposition: A | Discharge status: Home / Self Care

## 2017-10-02 ENCOUNTER — Ambulatory Visit
Admission: RE | Admit: 2017-10-02 | Discharge: 2017-10-02 | Disposition: A | Discharge status: Home / Self Care | Payer: MEDICARE

## 2017-10-02 DIAGNOSIS — Z940 Kidney transplant status: Secondary | ICD-10-CM | POA: Diagnosis not present

## 2017-10-02 DIAGNOSIS — Z992 Dependence on renal dialysis: Secondary | ICD-10-CM | POA: Diagnosis not present

## 2017-10-02 DIAGNOSIS — R350 Frequency of micturition: Secondary | ICD-10-CM | POA: Diagnosis not present

## 2017-10-02 DIAGNOSIS — D849 Immunodeficiency, unspecified: Secondary | ICD-10-CM | POA: Diagnosis not present

## 2017-10-02 DIAGNOSIS — Z944 Liver transplant status: Secondary | ICD-10-CM | POA: Diagnosis not present

## 2017-10-02 DIAGNOSIS — R630 Anorexia: Secondary | ICD-10-CM | POA: Diagnosis not present

## 2017-10-02 DIAGNOSIS — N186 End stage renal disease: Secondary | ICD-10-CM | POA: Diagnosis not present

## 2017-10-02 DIAGNOSIS — R197 Diarrhea, unspecified: Secondary | ICD-10-CM | POA: Diagnosis not present

## 2017-10-02 DIAGNOSIS — Z7952 Long term (current) use of systemic steroids: Secondary | ICD-10-CM | POA: Diagnosis not present

## 2017-10-02 DIAGNOSIS — D899 Disorder involving the immune mechanism, unspecified: Secondary | ICD-10-CM

## 2017-10-02 LAB — COMPREHENSIVE METABOLIC PANEL
A/G RATIO: 1.5 (ref 1.2–2.2)
ALBUMIN: 3.2 g/dL — ABNORMAL LOW (ref 3.6–4.8)
ALBUMIN: 3.2 g/dL — ABNORMAL LOW (ref 3.5–5.0)
ALKALINE PHOSPHATASE: 79 U/L (ref 38–126)
ALT (SGPT): 9 IU/L (ref 0–32)
ALT (SGPT): 24 U/L (ref 15–48)
AST (SGOT): 11 IU/L (ref 0–40)
AST (SGOT): 14 U/L (ref 14–38)
BILIRUBIN TOTAL: 0.3 mg/dL (ref 0.0–1.2)
BILIRUBIN TOTAL: 0.5 mg/dL (ref 0.0–1.2)
BLOOD UREA NITROGEN: 23 mg/dL (ref 8–27)
BLOOD UREA NITROGEN: 13 mg/dL (ref 7–21)
BUN / CREAT RATIO: 4 — ABNORMAL LOW (ref 12–28)
BUN / CREAT RATIO: 4
CALCIUM: 8.4 mg/dL — ABNORMAL LOW (ref 8.7–10.3)
CALCIUM: 8.5 mg/dL (ref 8.5–10.2)
CHLORIDE: 92 mmol/L — ABNORMAL LOW (ref 96–106)
CHLORIDE: 95 mmol/L — ABNORMAL LOW (ref 98–107)
CO2: 21 mmol/L (ref 20–29)
CO2: 32 mmol/L — ABNORMAL HIGH (ref 22.0–30.0)
CREATININE: 3.7 mg/dL — ABNORMAL HIGH (ref 0.60–1.00)
EGFR MDRD NON AF AMER: 12 mL/min/{1.73_m2} — ABNORMAL LOW (ref >=60)
GFR MDRD AF AMER: 8 mL/min/{1.73_m2} — ABNORMAL LOW
GFR MDRD NON AF AMER: 7 mL/min/{1.73_m2} — ABNORMAL LOW
GLOBULIN, TOTAL: 2.1 g/dL (ref 1.5–4.5)
GLUCOSE RANDOM: 91 mg/dL (ref 65–179)
GLUCOSE: 97 mg/dL (ref 65–99)
POTASSIUM: 4.5 mmol/L (ref 3.5–5.2)
POTASSIUM: 4.7 mmol/L (ref 3.5–5.0)
PROTEIN TOTAL: 5.5 g/dL — ABNORMAL LOW (ref 6.5–8.3)
SODIUM: 132 mmol/L — ABNORMAL LOW (ref 134–144)
SODIUM: 134 mmol/L — ABNORMAL LOW (ref 135–145)

## 2017-10-02 LAB — CBC W/ DIFFERENTIAL
BANDED NEUTROPHILS ABSOLUTE COUNT: 0.1 10*3/uL (ref 0.0–0.1)
BASOPHILS ABSOLUTE COUNT: 0.1 10*3/uL (ref 0.0–0.2)
BASOPHILS RELATIVE PERCENT: 2 %
EOSINOPHILS RELATIVE PERCENT: 7 %
HEMATOCRIT: 23.8 % — ABNORMAL LOW (ref 34.0–46.6)
HEMOGLOBIN: 8.2 g/dL — ABNORMAL LOW (ref 11.1–15.9)
IMMATURE GRANULOCYTES: 3 %
LYMPHOCYTES ABSOLUTE COUNT: 0.4 10*3/uL — ABNORMAL LOW (ref 0.7–3.1)
LYMPHOCYTES RELATIVE PERCENT: 9 %
MEAN CORPUSCULAR HEMOGLOBIN CONC: 34.5 g/dL (ref 31.5–35.7)
MEAN CORPUSCULAR HEMOGLOBIN: 30 pg (ref 26.6–33.0)
MEAN CORPUSCULAR VOLUME: 87 fL (ref 79–97)
MONOCYTES ABSOLUTE COUNT: 0.4 10*3/uL (ref 0.1–0.9)
MONOCYTES RELATIVE PERCENT: 10 %
NEUTROPHILS ABSOLUTE COUNT: 2.9 10*3/uL (ref 1.4–7.0)
NEUTROPHILS RELATIVE PERCENT: 69 %
PLATELET COUNT: 414 10*3/uL — ABNORMAL HIGH (ref 150–379)
RED BLOOD CELL COUNT: 2.73 x10E6/uL — CL (ref 3.77–5.28)
WHITE BLOOD CELL COUNT: 4.2 10*3/uL (ref 3.4–10.8)

## 2017-10-02 LAB — PHOSPHORUS: Phosphate:MCnc:Pt:Ser/Plas:Qn:: 3.6

## 2017-10-02 LAB — GAMMA GLUTAMYL TRANSFERASE
Gamma glutamyl transferase:CCnc:Pt:Ser/Plas:Qn:: 31
Lab: 30

## 2017-10-02 LAB — MAGNESIUM
Lab: 1.7
Magnesium:MCnc:Pt:Ser/Plas:Qn:: 1.7

## 2017-10-02 LAB — CBC W/ AUTO DIFF
BASOPHILS ABSOLUTE COUNT: 0.1 10*9/L (ref 0.0–0.1)
EOSINOPHILS ABSOLUTE COUNT: 0.2 10*9/L (ref 0.0–0.4)
HEMATOCRIT: 25 % — ABNORMAL LOW (ref 36.0–46.0)
HEMOGLOBIN: 7.9 g/dL — ABNORMAL LOW (ref 12.0–16.0)
LYMPHOCYTES ABSOLUTE COUNT: 0.3 10*9/L — ABNORMAL LOW (ref 1.5–5.0)
MEAN CORPUSCULAR HEMOGLOBIN CONC: 31.8 g/dL (ref 31.0–37.0)
MEAN CORPUSCULAR HEMOGLOBIN: 28.8 pg (ref 26.0–34.0)
MEAN CORPUSCULAR VOLUME: 90.7 fL (ref 80.0–100.0)
MEAN PLATELET VOLUME: 8.2 fL (ref 7.0–10.0)
MONOCYTES ABSOLUTE COUNT: 0.3 10*9/L (ref 0.2–0.8)
PLATELET COUNT: 437 10*9/L (ref 150–440)
RED BLOOD CELL COUNT: 2.75 10*12/L — ABNORMAL LOW (ref 4.00–5.20)
RED CELL DISTRIBUTION WIDTH: 17 % — ABNORMAL HIGH (ref 12.0–15.0)
WBC ADJUSTED: 3.8 10*9/L — ABNORMAL LOW (ref 4.5–11.0)

## 2017-10-02 LAB — MONOCYTES RELATIVE PERCENT: Lab: 10

## 2017-10-02 LAB — BLOOD UREA NITROGEN: Lab: 23

## 2017-10-02 LAB — TACROLIMUS BLOOD: Lab: 9.2

## 2017-10-02 LAB — ALT (SGPT): Alanine aminotransferase:CCnc:Pt:Ser/Plas:Qn:: 24

## 2017-10-02 LAB — CREATININE, URINE: Lab: 85.9

## 2017-10-02 LAB — BILIRUBIN DIRECT
Bilirubin.glucuronidated:MCnc:Pt:Ser/Plas:Qn:: 0.3
Lab: 0.15

## 2017-10-02 LAB — PROTEIN / CREATININE RATIO, URINE
PROTEIN URINE: 123 mg/dL
PROTEIN/CREAT RATIO, URINE: 1.432

## 2017-10-02 LAB — PATHOLOGIST SMEAR INTERPRETATION

## 2017-10-02 LAB — NEUTROPHIL LEFT SHIFT

## 2017-10-02 LAB — SMEAR REVIEW

## 2017-10-02 LAB — PHOSPHORUS, SERUM: Lab: 4.4

## 2017-10-02 MED ORDER — TACROLIMUS 1 MG CAPSULE
ORAL_CAPSULE | Freq: Two times a day (BID) | ORAL | 11 refills | 0 days
Start: 2017-10-02 — End: 2017-10-12

## 2017-10-02 NOTE — Unmapped (Signed)
Patient last filled at [facility] 09/25/17

## 2017-10-02 NOTE — Unmapped (Signed)
Restart taking your tacrolimus (Prograf) 5mg  tonight. Continue taking twice daily tomorrow morning.     Continue drinking fluids, up to 4 Transplant bottles per day ~2L.    Continue to measure your intake and urine output.     No dialysis tomorrow Wednesday 12/5.  No labs tomorrow Wednesday 12/5.    Repeat labs Thursday 12/6 in the AM. We will review those labs to see if you need dialysis Friday.    Keep the dressing on your left lower abdomen for 24 hours. If you have any acute abdominal pain in that area, swelling, or change to your urine output please call Scientist, physiological.

## 2017-10-02 NOTE — Unmapped (Signed)
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 She presents to clinic today for follow up.    Overall, she is doing functionally very well and healing nicely. In the last day or so she has seen increase in UOP from ~131mL/day to closer to 314mL/day. She has been dialyzing MWF without issue. Dr. Carlene Coria consulted today, will plan to hold Wednesday 12/4 dialysis and recheck labs 12/6. Will continue monitoring UOP, will work towards 2L water intake daily and reassess need for dialysis continuation/scheduling. Plan to decrease frequency of treatment.    She has been supratherapeutic with her most recent tac level from 11/30. She had her doses held 12/3 PM and 12/4 AM; will restart PM dosing today 5mg  BID. She does complain of persistent diarrhea ~2x/day since transplant. Once therapeutic on tac, if symptoms persist will consider reduction or retiming of Myfortic. For now, continue 540mg  myfortic BID and 5mg  prednisone.    JP with minimal output since discharge, ~25mL daily serosanguinous since discharged. Stripped today with no clot. JP removed and stitch placed without complication.     Follow Up Clinic: 2 weeks, 12/19. Plan for staple removal at that time.  Labs: 3x/week; 1-3 month increased risk donor testing     Subjective   Michelle Travis is a 61 y.o.  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 She did well postoperatively; however, her hospital course was complicated by pneumomediastinum and fluctuating creatinine.  The pneumomediastinum was addressed with Thoracic surgery who obtained a gastrograffin swallow study which demonstrated no leak, and the issue resolved by discharge. Her delay of kidney response was managed initially with CRRT and transitioned to HD, with last HD session the day prior to discharge. The patient was able to void spontaneously 11/21 after foley removal; however, she had persistently low UOP. She was discharged 09/25/17 (POD 12).    She presents to clinic today for initial outpatient follow up. Dr. Carlene Coria was consulted for scheduled nephrology and HD follow up.  Ms. Civil denies acute complaint today with regard to her surgical healing. She reports ~4ml daily of JP serosanguinous output and increasing UOP to ~337mL in 24 hours. She is drinking 3-4 transplant water bottles, presumed 16oz, daily. She reports she has loose-diarrhea like stools twice daily. No s/s of infection, no N/V/D/C, some decrease in appetite but drinking boost to supplement.  ??  Objective     Vitals:    10/02/17 0830   BP: 98/73   Pulse: 68   Temp: 36 ??C (96.8 ??F)   TempSrc: Tympanic   Weight: 73.7 kg (162 lb 8 oz)   Height: 160 cm (5' 2.99)      Body mass index is 28.79 kg/m??.     Physical Exam:    General Appearance:    No acute distress  Lungs:                 Clear to auscultation bilaterally  Heart:                            Regular rate and rhythm  Abdomen:                 Soft, round, mildly/appropriately tender along R lateral aspect of mercedes incision and to LLQ  near JP site. Non-distended. Mercedes and LLQ kidney transplant Incisions C/D/I, no drainage. JP drain intact with scant serosanguinous drainage in bulb.  Extremities:               Warm and well perfused, no edema    Data Review:  All lab results last 24 hours reviewed.    Imaging:   No images are attached to the encounter.    _________________________________________________        Ermalinda Barrios, DNP, APRN, FNP-C  [facility] for [facility]  [address]  [place], [place]  [address]

## 2017-10-02 NOTE — Unmapped (Signed)
Transplant Nephrology    I briefly saw patient in clinic today to make recommendations about need for dialysis. Patient has been doing well with increased UOP over the last couple of days (100 mL to 350 mL). Last received dialysis yesterday. Denies SOB/PND/orthopnea.    Creatinine 5.21 on Friday 11/30 prior to dialysis, was 6.16 on Monday 12/3 prior to dialysis. Creatinine today 3.7 following dialysis yesterday.    Physical exam    BP 98/73  - Pulse 68  - Temp 36 ??C (96.8 ??F) (Tympanic)  - Ht 160 cm (5' 2.99)  - Wt 73.7 kg (162 lb 8 oz)  - BMI 28.79 kg/m??   General: Patient is a pleasant female in no apparent distress.  Eyes: Sclera anicteric.  ENT: Oropharynx without lesions.   Neck: Supple without LAD/JVD/bruits.  Lungs: Clear to auscultation bilaterally, no wheezes/rales/rhonchi.  Cardiovascular: Regular rate and rhythm without murmurs, rubs or gallops.  Abdomen: Soft, notender/nondistended. Positive bowel sounds. No tenderness over the graft. Wound well approximated. Drains in place.  Extremities: Without edema, joints without evidence of synovitis.  Skin: Without rash.  Neurological: Grossly nonfocal.  Psychiatric: Mood and affect appropriate.    Lab Results   Component Value Date    WBC 3.8 (L) 10/02/2017    RBC 2.75 (L) 10/02/2017    HGB 7.9 (L) 10/02/2017    HCT 25.0 (L) 10/02/2017    MCV 90.7 10/02/2017    MCH 28.8 10/02/2017    MCHC 31.8 10/02/2017    RDW 17.0 (H) 10/02/2017    PLT 437 10/02/2017     Lab Results   Component Value Date    NA 134 (L) 10/02/2017    K 4.7 10/02/2017    CL 95 (L) 10/02/2017    CO2 32.0 (H) 10/02/2017    BUN 13 10/02/2017    CREATININE 3.70 (H) 10/02/2017    GLU 91 10/02/2017    CALCIUM 8.5 10/02/2017    ALBUMIN 3.2 (L) 10/02/2017    PHOS 3.6 10/02/2017     Lab Results   Component Value Date    ALKPHOS 79 10/02/2017    BILITOT 0.5 10/02/2017    BILIDIR 0.30 10/02/2017    PROT 5.5 (L) 10/02/2017    ALBUMIN 3.2 (L) 10/02/2017    ALT 24 10/02/2017    AST 14 10/02/2017       Assessment and Plan:    Hopefully kidney is recovering. Looks well on exam, no evidence of volume overload. Will hold dialysis tomorrow (Wednesday 12/5) and check labs locally on Thursday 12/6 to assess need for dialysis on Friday 12/7. Plan discussed with Ermalinda Barrios, FNP and communicated to patient's transplant coordinators.

## 2017-10-02 NOTE — Unmapped (Signed)
Patient seen with NP Daphine Deutscher.  Patient seen briefly by Nephrologist Dr. Carlene Coria.  Patient to HOLD Dialysis tomorrow and repeat labs on Thursday.  Once labs result on Friday decision will be made on if another round of dialysis is needed.  Left VM for [facility] Phone: [phone] making them aware and requesting call back to confirm they are aware.  Daughter agreed to also call to make them aware.      Education done for 20 minutes.  Provided patient and daughter education on how to record input and output.  Also provided a new hat and vital record log sheets.  Provided education about diarrhea being a side effect of myfortic and headaches and tremors being a side effect of tacrolimus.  Educated about need for high protein diet.  Educated change in taste and low appetite can occur after transplant and should improve over time.  Educated about [facility] being the pharmacy they should order medication form one week prior to running out of medications.      Next follow up appointment on 10/17/17.  See Providers note for further detail.

## 2017-10-02 NOTE — Addendum Note (Signed)
Addended by: Lianne Cure A on: 10/02/2017 12:19 PM   Modules accepted: Orders

## 2017-10-03 DIAGNOSIS — Z944 Liver transplant status: Secondary | ICD-10-CM | POA: Diagnosis not present

## 2017-10-03 DIAGNOSIS — Z4822 Encounter for aftercare following kidney transplant: Secondary | ICD-10-CM | POA: Diagnosis not present

## 2017-10-03 DIAGNOSIS — N189 Chronic kidney disease, unspecified: Secondary | ICD-10-CM | POA: Diagnosis not present

## 2017-10-03 DIAGNOSIS — Z9181 History of falling: Secondary | ICD-10-CM | POA: Diagnosis not present

## 2017-10-03 DIAGNOSIS — Z942 Lung transplant status: Secondary | ICD-10-CM | POA: Diagnosis not present

## 2017-10-03 DIAGNOSIS — Z7952 Long term (current) use of systemic steroids: Secondary | ICD-10-CM | POA: Diagnosis not present

## 2017-10-03 DIAGNOSIS — Z4823 Encounter for aftercare following liver transplant: Secondary | ICD-10-CM | POA: Diagnosis not present

## 2017-10-03 LAB — TACROLIMUS BLOOD: Lab: 13.6

## 2017-10-03 NOTE — Unmapped (Signed)
Spoke with patient daughter who reported just over night she has urinated 625 mL.  Let her know this is excellent news and hopefully after labs on Thursday we wont need dialysis but final decision will be made on Friday.

## 2017-10-04 DIAGNOSIS — Z940 Kidney transplant status: Secondary | ICD-10-CM | POA: Diagnosis not present

## 2017-10-04 DIAGNOSIS — Z944 Liver transplant status: Secondary | ICD-10-CM | POA: Diagnosis not present

## 2017-10-04 NOTE — Unmapped (Signed)
Pt daughter called to verify if her levels were high because she is having headaches. Advised her that the last level was 9.2 and she can take tylenol for the headache.

## 2017-10-04 NOTE — Unmapped (Signed)
Daughter called reporting   10/03/17 1175 mL out    2340 mL in   So far today  10/04/17 875 mL out   1200 mL in

## 2017-10-05 LAB — CBC W/ DIFFERENTIAL
BASOPHILS ABSOLUTE COUNT: 0.1 10*3/uL (ref 0.0–0.2)
BASOPHILS RELATIVE PERCENT: 3 %
EOSINOPHILS ABSOLUTE COUNT: 0.4 10*3/uL (ref 0.0–0.4)
EOSINOPHILS RELATIVE PERCENT: 11 %
HEMATOCRIT: 22.8 % — ABNORMAL LOW (ref 34.0–46.6)
HEMOGLOBIN: 7.3 g/dL — ABNORMAL LOW (ref 11.1–15.9)
MEAN CORPUSCULAR HEMOGLOBIN CONC: 32 g/dL (ref 31.5–35.7)
MEAN CORPUSCULAR HEMOGLOBIN: 28.5 pg (ref 26.6–33.0)
MEAN CORPUSCULAR VOLUME: 89 fL (ref 79–97)
MONOCYTES ABSOLUTE COUNT: 0.3 10*3/uL (ref 0.1–0.9)
MONOCYTES RELATIVE PERCENT: 7 %
NEUTROPHILS RELATIVE PERCENT: 68 %
PLATELET COUNT: 393 10*3/uL — ABNORMAL HIGH (ref 150–379)
RED BLOOD CELL COUNT: 2.56 x10E6/uL — CL (ref 3.77–5.28)
RED CELL DISTRIBUTION WIDTH: 16.5 % — ABNORMAL HIGH (ref 12.3–15.4)
WHITE BLOOD CELL COUNT: 3.7 10*3/uL (ref 3.4–10.8)

## 2017-10-05 LAB — COMPREHENSIVE METABOLIC PANEL
A/G RATIO: 1.8 (ref 1.2–2.2)
ALKALINE PHOSPHATASE: 84 IU/L (ref 39–117)
ALT (SGPT): 7 IU/L (ref 0–32)
AST (SGOT): 12 IU/L (ref 0–40)
BILIRUBIN TOTAL: 0.3 mg/dL (ref 0.0–1.2)
BLOOD UREA NITROGEN: 21 mg/dL (ref 8–27)
BUN / CREAT RATIO: 4 — ABNORMAL LOW (ref 12–28)
CALCIUM: 8.4 mg/dL — ABNORMAL LOW (ref 8.7–10.3)
CHLORIDE: 91 mmol/L — ABNORMAL LOW (ref 96–106)
CO2: 26 mmol/L (ref 20–29)
CREATININE: 5 mg/dL — ABNORMAL HIGH (ref 0.57–1.00)
GFR MDRD AF AMER: 10 mL/min/{1.73_m2} — ABNORMAL LOW
GFR MDRD NON AF AMER: 9 mL/min/{1.73_m2} — ABNORMAL LOW
GLOBULIN, TOTAL: 1.9 g/dL (ref 1.5–4.5)
GLUCOSE: 94 mg/dL (ref 65–99)
POTASSIUM: 4.7 mmol/L (ref 3.5–5.2)
SODIUM: 131 mmol/L — ABNORMAL LOW (ref 134–144)

## 2017-10-05 LAB — BILIRUBIN DIRECT: Lab: 0.14

## 2017-10-05 LAB — MAGNESIUM: Lab: 1.6

## 2017-10-05 LAB — GAMMA GLUTAMYL TRANSFERASE: Lab: 26

## 2017-10-05 LAB — PHOSPHORUS, SERUM: Lab: 3.9

## 2017-10-05 LAB — BUN / CREAT RATIO: Lab: 4 — ABNORMAL LOW

## 2017-10-05 LAB — WHITE BLOOD CELL COUNT: Lab: 3.7

## 2017-10-05 NOTE — Unmapped (Addendum)
10/03/17 Out 1175 mL; 10/04/17 In 1625 mL  out 2340 mL; 10/05/17 so far Today 775 mL out.    Per Dr. Carlene Coria reviewed labs and stated patient needs to continue to HOLD Dialysis and get labs on Monday.  Patient to keep dialysis chair for next Wednesday and on Tuesday we will decide if chair can be discontinued.  Spoke with daughter who confirmed about paging on-call over the weekend if swelling or shortness of breath occurs.  Know if shortness of breath occurs patient will likely need to come into the ER for urgent dialysis.  Also asked about inclement weather that is coming on Sunday and Monday.  Let them know I would like labs on Monday if not possible to get stat labs at local hospital preferably in the morning on Tuesday.  If roads are not drivable until the afternoon on Tuesday we will get labs then and skip the CSA level.  Made it clear to only drive if safe and that I will check in on Monday with them about lab plan.  Also let patient daughter know I am on-call this weekend and to page on-call coordinator for any concerns.      Made local dialysis aware of plan and they verbalized understanding.  Sent PA [place] and Research scientist (medical) note via [employer] to make other team members aware of plan.

## 2017-10-06 LAB — TACROLIMUS BLOOD: Lab: 7.5

## 2017-10-08 ENCOUNTER — Encounter (HOSPITAL_COMMUNITY)
Admission: RE | Admit: 2017-10-08 | Discharge: 2017-10-08 | Disposition: A | Discharge status: Home / Self Care | Payer: Medicare HMO | Source: Ambulatory Visit | Attending: Transplant Surgery | Admitting: Transplant Surgery

## 2017-10-08 DIAGNOSIS — Z79899 Other long term (current) drug therapy: Secondary | ICD-10-CM | POA: Diagnosis not present

## 2017-10-08 DIAGNOSIS — Z944 Liver transplant status: Secondary | ICD-10-CM | POA: Insufficient documentation

## 2017-10-08 DIAGNOSIS — K769 Liver disease, unspecified: Secondary | ICD-10-CM | POA: Insufficient documentation

## 2017-10-08 LAB — CBC WITH DIFFERENTIAL/PLATELET
Basophils Absolute: 0.1 10*3/uL (ref 0.0–0.1)
Basophils Relative: 4 %
EOS ABS: 0.5 10*3/uL (ref 0.0–0.7)
EOS PCT: 18 %
HCT: 24.4 % — ABNORMAL LOW (ref 36.0–46.0)
Hemoglobin: 7.7 g/dL — ABNORMAL LOW (ref 12.0–15.0)
LYMPHS ABS: 0.4 10*3/uL — AB (ref 0.7–4.0)
LYMPHS PCT: 13 %
MCH: 28.9 pg (ref 26.0–34.0)
MCHC: 31.6 g/dL (ref 30.0–36.0)
MCV: 91.7 fL (ref 78.0–100.0)
MONOS PCT: 12 %
Monocytes Absolute: 0.3 10*3/uL (ref 0.1–1.0)
Neutro Abs: 1.4 10*3/uL — ABNORMAL LOW (ref 1.7–7.7)
Neutrophils Relative %: 53 %
PLATELETS: 437 10*3/uL — AB (ref 150–400)
RBC: 2.66 MIL/uL — AB (ref 3.87–5.11)
RDW: 16 % — ABNORMAL HIGH (ref 11.5–15.5)
WBC: 2.7 10*3/uL — AB (ref 4.0–10.5)

## 2017-10-08 LAB — COMPREHENSIVE METABOLIC PANEL
ALK PHOS: 77 U/L (ref 38–126)
ALT: 8 U/L — AB (ref 14–54)
ANION GAP: 10 (ref 5–15)
AST: 14 U/L — ABNORMAL LOW (ref 15–41)
Albumin: 3.5 g/dL (ref 3.5–5.0)
BUN: 21 mg/dL — ABNORMAL HIGH (ref 6–20)
CALCIUM: 8.8 mg/dL — AB (ref 8.9–10.3)
CO2: 23 mmol/L (ref 22–32)
CREATININE: 2.9 mg/dL — AB (ref 0.44–1.00)
Chloride: 101 mmol/L (ref 101–111)
GFR, EST AFRICAN AMERICAN: 19 mL/min — AB (ref >60)
GFR, EST NON AFRICAN AMERICAN: 16 mL/min — AB (ref >60)
Glucose, Bld: 92 mg/dL (ref 65–99)
Potassium: 4.5 mmol/L (ref 3.5–5.1)
Sodium: 134 mmol/L — ABNORMAL LOW (ref 135–145)
Total Bilirubin: 0.5 mg/dL (ref 0.3–1.2)
Total Protein: 6.3 g/dL — ABNORMAL LOW (ref 6.5–8.1)

## 2017-10-08 LAB — MAGNESIUM: MAGNESIUM: 1.3 mg/dL — AB (ref 1.7–2.4)

## 2017-10-08 LAB — PHOSPHORUS: PHOSPHORUS: 3.2 mg/dL (ref 2.5–4.6)

## 2017-10-08 LAB — BILIRUBIN, DIRECT: BILIRUBIN DIRECT: 0.1 mg/dL (ref 0.1–0.5)

## 2017-10-08 MED ORDER — MYCOPHENOLATE SODIUM 180 MG TABLET,DELAYED RELEASE
ORAL_TABLET | Freq: Three times a day (TID) | ORAL | 3 refills | 0 days
Start: 2017-10-08 — End: 2017-10-11

## 2017-10-08 NOTE — Unmapped (Addendum)
Patient daughter reported local [facility] is closed and that they are going to the hospital for lab.  Sent standing lab order and confirmed with lab it was received.   Patient suffering from diarrhea last night and this am, stomach pain, and headache.  Per Dr. Rush Barer patient to change Myfortic dose 360 mg TID but no need for intervention with the tac level of 7.5 will continue to monitor.

## 2017-10-08 NOTE — Unmapped (Signed)
One time lab order entered for [facility], faxed and patient is aware

## 2017-10-09 LAB — BLOOD UREA NITROGEN: Lab: 21 — ABNORMAL HIGH

## 2017-10-09 LAB — COMPREHENSIVE METABOLIC PANEL
ALKALINE PHOSPHATASE: 77 U/L
ALT (SGPT): 8 U/L — ABNORMAL LOW
AST (SGOT): 14 U/L — ABNORMAL LOW
BLOOD UREA NITROGEN: 21 mg/dL — ABNORMAL HIGH
CALCIUM: 8.8 mg/dL — ABNORMAL LOW
CHLORIDE: 101 mmol/L
CO2: 23 mmol/L
EGFR MDRD AF AMER: 19 mL/min/{1.73_m2} — ABNORMAL LOW
EGFR MDRD NON AF AMER: 16 mL/min/{1.73_m2} — ABNORMAL LOW
GLUCOSE RANDOM: 92 mg/dL
POTASSIUM: 4.5 mmol/L
PROTEIN TOTAL: 6.3 g/dL — ABNORMAL LOW
SODIUM: 134 mmol/L — ABNORMAL LOW

## 2017-10-09 LAB — MAGNESIUM: Lab: 1.3 — ABNORMAL LOW

## 2017-10-09 LAB — GAMMA GLUTAMYL TRANSFERASE: Lab: 26

## 2017-10-09 LAB — BILIRUBIN DIRECT: Lab: 0.1

## 2017-10-09 LAB — ALBUMIN: Lab: 3.5

## 2017-10-09 LAB — PHOSPHORUS: Lab: 3.2

## 2017-10-09 LAB — GAMMA GT: GGT: 26 U/L (ref 7–50)

## 2017-10-09 NOTE — Unmapped (Signed)
Spoke with daughter who reported patient has had 2340 mL of water in  and 1375 out of urine on 10/08/17.  850 mL out today so far.  Paged Dr. Carlene Coria.  Stated patient no longer needs dialysis and we can give up her chair.  Made daughter aware and she agreed to get labs drawn on Wednesday and Friday.  Called local dialysis center and they also confirmed they would give up patients chair.

## 2017-10-10 ENCOUNTER — Encounter: Payer: Self-pay | Admitting: *Deleted

## 2017-10-10 ENCOUNTER — Other Ambulatory Visit: Payer: Self-pay | Admitting: *Deleted

## 2017-10-10 DIAGNOSIS — Z940 Kidney transplant status: Secondary | ICD-10-CM | POA: Diagnosis not present

## 2017-10-10 DIAGNOSIS — Z944 Liver transplant status: Secondary | ICD-10-CM | POA: Diagnosis not present

## 2017-10-10 NOTE — Patient Outreach (Signed)
[facility] [facility]) Care Management  10/10/2017  KANDISS IHRIG 22-Nov-1955 [id number]  Telephone call attempt x 4; unable to leave message on 4th attempt. No response received from outreach letter.   1:15PM received return call from patient from who gave HIPPA verification. Patient was advised of [employer] care management services. States she currently is receiving case management services through transplant Coordinator at [facility] in [place]. States she recently had liver/kidney transplant 09/13/17 and discharged to home 11/27.  States she is doing well. Voices she was released from dialysis this week. Voices that she has supportive family & daughter is her caregiver. States she has transportation to all MD appointments. Voices that she has had hospital follow up with transplant MD and is having follow up with primary care provider next week. Voices that she has all of her medications & daughter sets them up  & she takes them as prescribes consistently & understands importance of taking as prescribed.  States she has transplant coordinator's name & number & knows to call as needed. States she currently does need [facility] care management services at this time.   Plan: Close case/send MD closure letter. Send to care management assistant to close case.   Sherrin Daisy, RN BSN [place] Management Coordinator [facility] Care Management  [phone]

## 2017-10-11 LAB — MAGNESIUM: Lab: 1.3 — ABNORMAL LOW

## 2017-10-11 LAB — COMPREHENSIVE METABOLIC PANEL
A/G RATIO: 1.9 (ref 1.2–2.2)
ALBUMIN: 3.6 g/dL (ref 3.6–4.8)
ALKALINE PHOSPHATASE: 79 IU/L (ref 39–117)
ALT (SGPT): 7 IU/L (ref 0–32)
AST (SGOT): 11 IU/L (ref 0–40)
BILIRUBIN TOTAL: 0.2 mg/dL (ref 0.0–1.2)
BLOOD UREA NITROGEN: 17 mg/dL (ref 8–27)
CALCIUM: 8.9 mg/dL (ref 8.7–10.3)
CO2: 22 mmol/L (ref 20–29)
CREATININE: 2.13 mg/dL — ABNORMAL HIGH (ref 0.57–1.00)
GFR MDRD AF AMER: 28 mL/min/{1.73_m2} — ABNORMAL LOW
GFR MDRD NON AF AMER: 24 mL/min/{1.73_m2} — ABNORMAL LOW
GLOBULIN, TOTAL: 1.9 g/dL (ref 1.5–4.5)
GLUCOSE: 99 mg/dL (ref 65–99)
POTASSIUM: 5.3 mmol/L — ABNORMAL HIGH (ref 3.5–5.2)
SODIUM: 140 mmol/L (ref 134–144)
TOTAL PROTEIN: 5.5 g/dL — ABNORMAL LOW (ref 6.0–8.5)

## 2017-10-11 LAB — CBC W/ DIFFERENTIAL
BANDED NEUTROPHILS ABSOLUTE COUNT: 0 10*3/uL (ref 0.0–0.1)
BASOPHILS ABSOLUTE COUNT: 0.1 10*3/uL (ref 0.0–0.2)
BASOPHILS RELATIVE PERCENT: 5 %
EOSINOPHILS ABSOLUTE COUNT: 0.4 10*3/uL (ref 0.0–0.4)
EOSINOPHILS RELATIVE PERCENT: 17 %
HEMATOCRIT: 23.4 % — ABNORMAL LOW (ref 34.0–46.6)
HEMOGLOBIN: 7.4 g/dL — ABNORMAL LOW (ref 11.1–15.9)
LYMPHOCYTES ABSOLUTE COUNT: 0.4 10*3/uL — ABNORMAL LOW (ref 0.7–3.1)
LYMPHOCYTES RELATIVE PERCENT: 17 %
MEAN CORPUSCULAR HEMOGLOBIN: 28 pg (ref 26.6–33.0)
MEAN CORPUSCULAR VOLUME: 89 fL (ref 79–97)
MONOCYTES ABSOLUTE COUNT: 0.2 10*3/uL (ref 0.1–0.9)
MONOCYTES RELATIVE PERCENT: 10 %
NEUTROPHILS ABSOLUTE COUNT: 1.1 10*3/uL — ABNORMAL LOW (ref 1.4–7.0)
NEUTROPHILS RELATIVE PERCENT: 50 %
PLATELET COUNT: 495 10*3/uL — ABNORMAL HIGH (ref 150–379)
RED BLOOD CELL COUNT: 2.64 x10E6/uL — CL (ref 3.77–5.28)
RED CELL DISTRIBUTION WIDTH: 16.3 % — ABNORMAL HIGH (ref 12.3–15.4)
WHITE BLOOD CELL COUNT: 2.2 10*3/uL — CL (ref 3.4–10.8)

## 2017-10-11 LAB — BILIRUBIN TOTAL: Lab: 0.2

## 2017-10-11 LAB — LYMPHOCYTES ABSOLUTE COUNT: Lab: 0.4 — ABNORMAL LOW

## 2017-10-11 LAB — PHOSPHORUS, SERUM: Lab: 2.6

## 2017-10-11 LAB — BILIRUBIN DIRECT: Lab: 0.11

## 2017-10-11 LAB — GAMMA GLUTAMYL TRANSFERASE: Lab: 20

## 2017-10-11 LAB — TACROLIMUS LEVEL: Tacrolimus (FK506) - LabCorp: 6.3 ng/mL (ref 2.0–20.0)

## 2017-10-11 MED ORDER — MYCOPHENOLATE SODIUM 180 MG TABLET,DELAYED RELEASE
ORAL_TABLET | 3 refills | 0 days
Start: 2017-10-11 — End: 2017-10-17

## 2017-10-11 NOTE — Unmapped (Signed)
Called [facility] hospital lab at [phone] to request all lab results from 12/10. Lab tech stated they dont process tacrolimus its a send out but they will fax cbc w diff to [phone]. Called [facility] hospital service at [phone] to request tac from 12/10 to be faxed to [facility] liver transplant at [phone].

## 2017-10-11 NOTE — Unmapped (Signed)
Patient daughter reported patient's stomach hurts but no incision pain.  Right side is a little sore but improving.  Diarrhea occurred 3 times today so far with tremors.  Denies any vomiting, fevers, itching, or jaundice.  Patient appetite up little but not much and continues to loose weight.  Dr. Celine Mans stated to change patient Myfortic to 360 mg at 9 am , 180 mg at 1 pm, 180 mg at 5 pm, and 360 mg at 9 pm. Also stated appetite will improve and patient is still mobilizing her fluid.  Spoke with daughter and she verbalized understanding to medication dose change and low potassium diet education. Also agreed to try giving patient Protonix one hour prior to the rest of her medication.

## 2017-10-12 DIAGNOSIS — Z944 Liver transplant status: Secondary | ICD-10-CM | POA: Diagnosis not present

## 2017-10-12 DIAGNOSIS — Z940 Kidney transplant status: Secondary | ICD-10-CM | POA: Diagnosis not present

## 2017-10-12 LAB — CBC W/ DIFFERENTIAL
BASOPHILS ABSOLUTE COUNT: 0.1 10*9/L
EOSINOPHILS ABSOLUTE COUNT: 0.5 10*9/L
HEMATOCRIT: 24.4 % — ABNORMAL LOW
HEMOGLOBIN: 7.7 g/dL — ABNORMAL LOW
LYMPHOCYTES ABSOLUTE COUNT: 0.4 10*9/L — ABNORMAL LOW
MONOCYTES ABSOLUTE COUNT: 0.3 10*9/L
NEUTROPHILS ABSOLUTE COUNT: 1.4 10*9/L — ABNORMAL LOW
PLATELET COUNT: 437 10*9/L — ABNORMAL HIGH
WHITE BLOOD CELL COUNT: 2.7 10*9/L — ABNORMAL LOW

## 2017-10-12 LAB — TACROLIMUS BLOOD: Lab: 5.6

## 2017-10-12 LAB — PLATELET COUNT: Lab: 437 — ABNORMAL HIGH

## 2017-10-12 MED ORDER — TACROLIMUS 1 MG CAPSULE
ORAL_CAPSULE | Freq: Two times a day (BID) | ORAL | 3 refills | 0 days | Status: CP
Start: 2017-10-12 — End: 2017-10-24

## 2017-10-12 NOTE — Unmapped (Signed)
Per Dr. Matilde Haymaker patient to increase tacrolimus to 6 mg BID.  Patient and daughter verbalized understanding.

## 2017-10-13 LAB — CBC W/ DIFFERENTIAL
BANDED NEUTROPHILS ABSOLUTE COUNT: 0 10*3/uL (ref 0.0–0.1)
BASOPHILS ABSOLUTE COUNT: 0.1 10*3/uL (ref 0.0–0.2)
BASOPHILS RELATIVE PERCENT: 5 %
EOSINOPHILS ABSOLUTE COUNT: 0.4 10*3/uL (ref 0.0–0.4)
EOSINOPHILS RELATIVE PERCENT: 18 %
HEMATOCRIT: 25 % — ABNORMAL LOW (ref 34.0–46.6)
HEMOGLOBIN: 7.9 g/dL — ABNORMAL LOW (ref 11.1–15.9)
IMMATURE GRANULOCYTES: 1 %
LYMPHOCYTES RELATIVE PERCENT: 24 %
MEAN CORPUSCULAR HEMOGLOBIN CONC: 31.6 g/dL (ref 31.5–35.7)
MEAN CORPUSCULAR HEMOGLOBIN: 28.5 pg (ref 26.6–33.0)
MEAN CORPUSCULAR VOLUME: 90 fL (ref 79–97)
MONOCYTES ABSOLUTE COUNT: 0.3 10*3/uL (ref 0.1–0.9)
MONOCYTES RELATIVE PERCENT: 11 %
NEUTROPHILS RELATIVE PERCENT: 41 %
PLATELET COUNT: 499 10*3/uL — ABNORMAL HIGH (ref 150–379)
RED BLOOD CELL COUNT: 2.77 x10E6/uL — ABNORMAL LOW (ref 3.77–5.28)
RED CELL DISTRIBUTION WIDTH: 16.2 % — ABNORMAL HIGH (ref 12.3–15.4)
WHITE BLOOD CELL COUNT: 2.4 10*3/uL — CL (ref 3.4–10.8)

## 2017-10-13 LAB — COMPREHENSIVE METABOLIC PANEL
A/G RATIO: 2 (ref 1.2–2.2)
ALBUMIN: 3.9 g/dL (ref 3.6–4.8)
ALKALINE PHOSPHATASE: 79 IU/L (ref 39–117)
ALT (SGPT): 8 IU/L (ref 0–32)
AST (SGOT): 12 IU/L (ref 0–40)
BILIRUBIN TOTAL: 0.2 mg/dL (ref 0.0–1.2)
BLOOD UREA NITROGEN: 12 mg/dL (ref 8–27)
BUN / CREAT RATIO: 7 — ABNORMAL LOW (ref 12–28)
CALCIUM: 9.1 mg/dL (ref 8.7–10.3)
CHLORIDE: 108 mmol/L — ABNORMAL HIGH (ref 96–106)
CO2: 20 mmol/L (ref 20–29)
CREATININE: 1.74 mg/dL — ABNORMAL HIGH (ref 0.57–1.00)
GFR MDRD AF AMER: 36 mL/min/{1.73_m2} — ABNORMAL LOW
GLOBULIN, TOTAL: 2 g/dL (ref 1.5–4.5)
GLUCOSE: 93 mg/dL (ref 65–99)
POTASSIUM: 5.4 mmol/L — ABNORMAL HIGH (ref 3.5–5.2)
SODIUM: 141 mmol/L (ref 134–144)

## 2017-10-13 LAB — GAMMA GLUTAMYL TRANSFERASE: Lab: 19

## 2017-10-13 LAB — PHOSPHORUS, SERUM: Lab: 2.5

## 2017-10-13 LAB — MAGNESIUM: Lab: 1.2 — ABNORMAL LOW

## 2017-10-13 LAB — PHOSPHORUS: PHOSPHORUS, SERUM: 2.5 mg/dL (ref 2.5–4.5)

## 2017-10-13 LAB — ALBUMIN: Lab: 3.9

## 2017-10-13 LAB — BILIRUBIN DIRECT: Lab: 0.12

## 2017-10-13 LAB — WHITE BLOOD CELL COUNT: Lab: 2.4 — CL

## 2017-10-14 LAB — TACROLIMUS BLOOD: Lab: 8

## 2017-10-15 DIAGNOSIS — Z940 Kidney transplant status: Secondary | ICD-10-CM | POA: Diagnosis not present

## 2017-10-15 DIAGNOSIS — Z944 Liver transplant status: Secondary | ICD-10-CM | POA: Diagnosis not present

## 2017-10-15 NOTE — Unmapped (Signed)
Reviewed low magnesium, increased potassium, and high platelet count with PharmD Nedra Hai. Patient to start Asprin 81 mg daily and maintain low potassium diet.  Daughter verbalized understanding and agreed to instructions of coming to hospital clinic if neurology does not feel comfortable removing staples.

## 2017-10-16 DIAGNOSIS — F1021 Alcohol dependence, in remission: Secondary | ICD-10-CM | POA: Diagnosis not present

## 2017-10-16 DIAGNOSIS — I10 Essential (primary) hypertension: Secondary | ICD-10-CM | POA: Diagnosis not present

## 2017-10-16 DIAGNOSIS — Z940 Kidney transplant status: Secondary | ICD-10-CM | POA: Diagnosis not present

## 2017-10-16 DIAGNOSIS — Z944 Liver transplant status: Secondary | ICD-10-CM | POA: Diagnosis not present

## 2017-10-16 LAB — COMPREHENSIVE METABOLIC PANEL
A/G RATIO: 1.8 (ref 1.2–2.2)
ALBUMIN: 4.2 g/dL (ref 3.6–4.8)
ALKALINE PHOSPHATASE: 95 IU/L (ref 39–117)
ALT (SGPT): 8 IU/L (ref 0–32)
AST (SGOT): 14 IU/L (ref 0–40)
BILIRUBIN TOTAL: 0.3 mg/dL (ref 0.0–1.2)
BLOOD UREA NITROGEN: 9 mg/dL (ref 8–27)
BUN / CREAT RATIO: 7 — ABNORMAL LOW (ref 12–28)
CALCIUM: 9.2 mg/dL (ref 8.7–10.3)
CHLORIDE: 106 mmol/L (ref 96–106)
CO2: 21 mmol/L (ref 20–29)
CREATININE: 1.28 mg/dL — ABNORMAL HIGH (ref 0.57–1.00)
GFR MDRD AF AMER: 52 mL/min/{1.73_m2} — ABNORMAL LOW
GFR MDRD NON AF AMER: 45 mL/min/{1.73_m2} — ABNORMAL LOW
GLOBULIN, TOTAL: 2.3 g/dL (ref 1.5–4.5)
GLUCOSE: 97 mg/dL (ref 65–99)
SODIUM: 138 mmol/L (ref 134–144)
TOTAL PROTEIN: 6.5 g/dL (ref 6.0–8.5)

## 2017-10-16 LAB — GAMMA GT: GAMMA GLUTAMYL TRANSFERASE: 24 IU/L (ref 0–60)

## 2017-10-16 LAB — SODIUM: Lab: 138

## 2017-10-16 LAB — GAMMA GLUTAMYL TRANSFERASE: Lab: 24

## 2017-10-16 LAB — MAGNESIUM: Lab: 1.3 — ABNORMAL LOW

## 2017-10-16 LAB — BILIRUBIN DIRECT: Lab: 0.12

## 2017-10-16 LAB — PHOSPHORUS, SERUM: Lab: 2.6

## 2017-10-16 LAB — BILIRUBIN, DIRECT: BILIRUBIN DIRECT: 0.12 mg/dL (ref 0.00–0.40)

## 2017-10-16 MED ORDER — PANTOPRAZOLE 40 MG TABLET,DELAYED RELEASE: 40 mg | tablet | 99 refills | 0 days

## 2017-10-16 MED ORDER — PANTOPRAZOLE 40 MG TABLET,DELAYED RELEASE
ORAL_TABLET | ORAL | 99 refills | 0.00000 days | Status: CP
Start: 2017-10-16 — End: 2017-10-16

## 2017-10-16 NOTE — Unmapped (Signed)
Transplant Nephrology Clinic Visit    History of Present Illness  Michelle Travis is a 61 y.o. 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  She had delayed renal allograft function which required hemodialysis beginning on 09/14/17 and ending 09/24/2017.  Serum creatinine has improved to 1.2 mg/dL.  UP/C has improved from 39.2 (11/03/16) to 6.0 (09/24/2017) then 1.43 (10/02/2017). Native  kidney biopsy revealed PLA-2 positivity. She has not had a kidney transplant biopsy.     She presents today noting gradual improvement in strength. Edema has resolved. BP remains low though she has not had syncope. Her appetite is poor. She notes frequent diarrhea. She was on dialysis from January of 2018 until her transplant. She denies shortness of breath or chest pain.      Transplant History:  1. 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 Kidney KDPI 26%. CMV D+/R+; EBV D-/R+, hep C Ab +/NAT- (recipient hep C Ab negative)  4. Complicated by rectus sheath hematoma  5. Delayed graft function requiring hemodialysis sessions following transplant (stopped dialysis 11/26/018).  6. Immunosuppression was ??? induction followed by tacrolimus and myfortic maintenance therapy.   7. Baseline creatinine 1.2 mg/dL.    Past Medical History:  1. Alcoholic cirrhosis, S/P TIPS  2. Membranous nephropathy, PLA2R positive biopsy 08/30/2016, treated solumedrol initially with little improvement then with Rituximab (10/2016 and 03/2017).   3. S/P kidney and liver transplants as stated above  4. History of breast cancer, in remission for 17 years, s/p lumpectomy.  5. History of IGG monoclonal gammopathy, IgG lambda. Kidney biopsy 08/30/2016 without MGRS.  6. S/p cerebral aneurysm repair.  7. S/P total knee replacement  8. Essential HTN prior to ESLD  9. History of diverticulitis  10. Recurrent pleural effusion, history of pneumonia x 3  11. Osteopenia by DEXA 06/14/2016  12. Hypoattenuating thyroid nodule noted on chest CT 11/11/2016  13. Sleep apnea    Review of Systems    All other systems are reviewed and are negative.    Medications    Current Outpatient Prescriptions   Medication Sig Dispense Refill   ??? acetaminophen (TYLENOL) 325 MG tablet Take 1-2 tablets (325-650 mg total) by mouth every six (6) hours as needed for pain. 100 tablet 0   ??? aspirin (ECOTRIN) 81 MG tablet Take 1 tablet (81 mg total) by mouth daily. HOLD until directed to start by your coordinator. (Patient not taking: Reported on 10/02/2017) 30 tablet 0   ??? docusate sodium (COLACE) 100 MG capsule Take 1 capsule (100 mg total) by mouth two (2) times a day as needed for constipation. 60 capsule 0   ??? magnesium oxide (MAG-OX) 400 mg (241.3 mg magnesium) tablet Take 1 tablet (400 mg total) by mouth Two (2) times a day. HOLD until directed to start by your coordinator. (Patient not taking: Reported on 10/02/2017) 60 tablet 11   ??? mycophenolate (MYFORTIC) 180 MG EC tablet 360 mg at 9 am , 180 mg at 1 pm, 180 mg at 5 pm, and 360 mg at 9 pm 540 tablet 3   ??? pantoprazole (PROTONIX) 40 MG tablet TAKE 1 TABLET BY MOUTH ONCE DAILY 30 tablet PRN   ??? polyethylene glycol (MIRALAX) 17 gram/dose powder Take 17 g by mouth daily as needed. 289 g 0   ???  predniSONE (DELTASONE) 5 MG tablet Take 1 tablet (5 mg total) by mouth daily. 30 tablet 11   ??? sulfamethoxazole-trimethoprim (BACTRIM,SEPTRA) 400-80 mg per tablet Take 1 tablet (80 mg of trimethoprim total) by mouth 3 (three) times a week. 12 tablet 5   ??? tacrolimus (PROGRAF) 1 MG capsule Take 6 capsules (6 mg total) by mouth Two (2) times a day. 1080 capsule 3   ??? valGANciclovir (VALCYTE) 450 mg tablet Take 1 tablet (450 mg total) by mouth daily. 30 tablet 2     No current facility-administered medications for this visit.        Physical Exam    BP 112/70 (BP Site: R Arm, BP Position: Sitting, BP Cuff Size: Medium)  - Pulse 76  - Temp 37.3 ??C (99.1 ??F) (Temporal)  - Ht 160 cm (5' 2.99)  - Wt 67 kg (147 lb 12.8 oz)  - BMI 26.19 kg/m??   General: Patient is a pleasant female in no apparent distress.  Eyes: Sclera anicteric.  Neck: Supple without LAD/JVD/bruits.  Lungs: Clear to auscultation bilaterally, no wheezes/rales/rhonchi.  Cardiovascular: Regular rate and rhythm without murmurs, rubs or gallops.  Abdomen: Soft, notender/nondistended. Positive bowel sounds. No hepatosplenomegaly, masses or bruits appreciated. Tunneled catheter in place in right upper chest. Staples in place. Incision well healed.  Extremities: Without edema, joints without evidence of synovitis  Skin: Without rash  Neurological: Grossly nonfocal.  Psychiatric: Mood and affect appropriate.    Laboratory Results    Results for orders placed or performed in visit on 10/17/17   Comprehensive Metabolic Panel   Result Value Ref Range    Sodium 138 135 - 145 mmol/L    Potassium 5.4 (H) 3.5 - 5.0 mmol/L    Chloride 107 98 - 107 mmol/L    CO2 19.0 (L) 22.0 - 30.0 mmol/L    BUN 14 7 - 21 mg/dL    Creatinine 1.61 (H) 0.60 - 1.00 mg/dL    BUN/Creatinine Ratio 11     EGFR MDRD Non Af Amer 42 (L) >=60 mL/min/1.17m2    EGFR MDRD Af Amer 50 (L) >=60 mL/min/1.65m2    Anion Gap 12 9 - 15 mmol/L    Glucose 89 65 - 99 mg/dL    Calcium 9.2 8.5 - 09.6 mg/dL    Albumin 4.2 3.5 - 5.0 g/dL    Total Protein 6.2 (L) 6.5 - 8.3 g/dL    Total Bilirubin 0.5 0.0 - 1.2 mg/dL    AST 17 14 - 38 U/L    ALT 25 15 - 48 U/L    Alkaline Phosphatase 99 38 - 126 U/L   Bilirubin, Direct   Result Value Ref Range    Bilirubin, Direct 0.30 0.00 - 0.40 mg/dL   Phosphorus Level   Result Value Ref Range    Phosphorus 3.1 2.9 - 4.7 mg/dL   Magnesium Level   Result Value Ref Range    Magnesium 1.2 (L) 1.6 - 2.2 mg/dL   Gamma GT   Result Value Ref Range    GGT 32 11 - 48 U/L   CBC w/ Differential   Result Value Ref Range    WBC 2.1 (L) 4.5 - 11.0 10*9/L    RBC 3.26 (L) 4.00 - 5.20 10*12/L    HGB 8.7 (L) 12.0 - 16.0 g/dL    HCT 04.5 (L) 40.9 - 46.0 %    MCV 91.9 80.0 - 100.0 fL    MCH 26.7 26.0 - 34.0 pg    MCHC  29.1 (L) 31.0 - 37.0 g/dL    RDW 84.1 (H) 32.4 - 15.0 %    MPV 8.0 7.0 - 10.0 fL    Platelet 536 (H) 150 - 440 10*9/L    Variable HGB Concentration Slight (A) Not Present    Neutrophil Left Shift 2+ (A) Not Present    Absolute Neutrophils 1.0 (L) 2.0 - 7.5 10*9/L    Absolute Lymphocytes 0.5 (L) 1.5 - 5.0 10*9/L    Absolute Monocytes 0.3 0.2 - 0.8 10*9/L    Absolute Eosinophils 0.2 0.0 - 0.4 10*9/L    Absolute Basophils 0.1 0.0 - 0.1 10*9/L    Large Unstained Cells 4 0 - 4 %    Hypochromasia Marked (A) Not Present   Myeloma Workup Chemistries   Result Value Ref Range    Total Protein 6.5 6.5 - 8.3 g/dL    Total IgG 401 (L) 027-2,536 mg/dL    IgM <64 (L) 35 - 403 mg/dL    IgA 47.4 25.9 - 563.8 mg/dL       Assessment and Plan  Michelle Travis is a 61 y.o. female who is the recipient of combined liver and kidney transplants on 09/13/17. Active medical issues include:    1. Status post kidney transplant. Creatinine today is 1.3 mg/dL. UP/C is pending but has been improving with obvious importance given the primary diagnosis of PLA2R positive membranous nephropathy in native kidneys.  As best I can determine her last dose of Rituximab was 03/2017. Will plan removal of dialysis catheter and ureteral stent.     2. S/P Liver transplant with excellent liver function.     3. Immunosuppression. Patient will continue tacrolimus 6 mg BID, myfortic 360 mg BID, and prednisone 5 mg daily. If membranous recurs would consider Rituximab.    4. Infection prevention. She will continue Valcyte 450 mg daily for three months and Bactrim 400-80 mg three times per week.    5. History of IgG lambda monoclonal gammopathy.    6. Follow-up. Patient will return to clinic in 4 weeks. She will continue twice weekly labs.    Scribe's Attestation: Jackey Loge, MD obtained and performed the history, physical exam and medical decision making elements that were entered into the chart.  Signed by [place] Ormond Foster, Scribe, on October 17, 2017 11:40 AM.    ----------------------------------------------------------------------------------------------------------------------  October 17, 2017 6:42 PM. Documentation assistance provided by the Scribe. I was present during the time the encounter was recorded. The information recorded by the Scribe was done at my direction and has been reviewed and validated by me.  ----------------------------------------------------------------------------------------------------------------------

## 2017-10-16 NOTE — Unmapped (Signed)
Patient request for RX refill.

## 2017-10-16 NOTE — Unmapped (Signed)
Outpatient Adult Nutrition - Post Liver/Renal Transplant Follow-up     Referring MD or Clinic: Post Renal Transplant Clinic     Reason for Visit: Post Renal Transplant Evaluation Follow-up; transplant on 09/12/17    Changes from last RD visit on   Complains of stomach ache after eating; only able to take a few bites of food  Wt loss after transplant  Following food safety guidelines   Drinks Ensure at times     PMH:   Patient Active Problem List   Diagnosis   ??? Cirrhosis, alcoholic (CMS-HCC)   ??? Hypertension (RAF-HCC)   ??? Lower extremity edema   ??? Anasarca   ??? Anemia of chronic disease   ??? Chronic back pain   ??? History of breast cancer 2000 s/p lumpectomy   ??? Sleep apnea   ??? Membranous Nephropathy with segmental scarring   ??? Dialysis patient (CMS-HCC)   ??? Rectus sheath hematoma   ??? Liver replaced by transplant (CMS-HCC)   ??? Kidney transplanted       Anthropometrics:   October 17, 2017 Weight: 66.7 kg  Height: 160 cm  BMI: 26.05  IBW: *52.2 kg    Pt is overweight per BMI standards.      Previous Weights:    09/13/17 72.6 kg (160 lb 0.9 oz)   07/31/17 73.3 kg (161 lb 11.2 oz)   06/21/17 72.8 kg (160 lb 8 oz)   06/12/17 72.6 kg (160 lb)   04/10/17 72.4 kg (159 lb 9.8 oz)   02/05/17 73.3 kg (161 lb 9.6 oz)   02/05/17 73.3 kg (161 lb 9.6 oz)   12/08/16 78.8 kg (173 lb 11.2 oz)   11/25/16 84.9 kg (187 lb 2.7 oz)   10/02/16 98.4 kg (217 lb)       Weight Change: wt loss of 13 lbs during past 5 weeks    Nutrition-Focused Physical Findings:   -- Muscle mass: no loss noted  ----- Areas Assessed: temple  -- Fat mass: no loss noted  ----- Areas Assessed: orbital    Relevant Medications, Herbs, Supplements include: Reviewed all nutritionally relevant medications and supplements   Prograf (aware of grapefruit, pomegranate, star fruit and limit clementine x 1 daily)  Mycophenolate (aware of grapefruit, pomegranate, star fruit and limit clementine x 1 daily  Mg oxide, not taking  Prednisone  Bowel regimen    Relevant Labs: labs pending from today  10/15/17 K 5.4(H), Mg 1.3 (L) team aware and monitoring    Physical Activity: Not at this time    Dietary Restrictions: food safety guidelines, nuts and small seeds    Allergies:   Allergies   Allergen Reactions   ??? Latex Swelling and Rash     Discoloration of skin.   Discoloration of skin.    ??? Hydrocodone Itching   ??? Hydrocodone-Acetaminophen Itching        Usual Intake:   1st - egg white with bacon x 2; water or cranberry   Snack -nabs or   2nd - hot wing x 4   Snack - potato chips x 2  3rd - wings x 3   Snack - chocolate pudding   Beverages - water, cranberry juice    Other Usual Intake: jello, fried fish     Behavioral Risk Factors:  Emotional Eating??no  Meal Schedule?? No  Meal Skipping?? No  Grazing?? Yes  Night Eating?? No  Sleep?? 6-8 hours  Overeating?? No    Hunger and Satiety: pt endorses not so great  appetite    Nutrition History: denies c/s/n/v/d at this time; reports drinking at least 80 ounces    Social: Recently lost husband in October, 2018, lives alone, family nearby; daughter was present with pt during this assessment    Estimated Daily Nutritional Needs:   Estimated Energy Needs: [phone] kcal/day (current wt 25-30 kcal/kg)    Estimated Protein Needs: 80-100 g protein/day (current wt 1.2-1.5 g/kg post liver/renal transplant)    Estimated Fluid Needs: per MD    Nutrition Assessment: Pt likely not meeting estimated nutritional needs based on wt loss and dietary recall. Current diet has lots of sodium, fat and empty calories. Lacking is lean protein, low K F/V, whole grains, low fat dairy foods, and fiber. Pt having a hard time with eating since she expresses poor appetite. Pt may be depressed since she is grieving the loss of her husband of 28 years. Will need to continue to monitor his po intake and wts.     Benefiting from ONS to assist with calorie and protein needs. Agree with ONS, but also discussed other supplements such as smaller frequent meals.     Pt experienced 8.1 % involuntary wt loss during past 5 weeks d/t hospitalization, husband' s recent death, depression,and/or fluids. Will need to monitor wts. If wt do not stabilize or improve during next 2 weeks, consider appetite stimulant to assist with maintaining wt.     Pt assessed to have poor nutrition knowledge for post liver and renal transplant MNT.       Progress towards goals: in process    Nutrition Diagnosis:  Malnutrition Assessment using AND/ASPEN Clinical Characteristics:    Patient does not meet AND/ASPEN criteria for malnutrition at this time (10/17/17 1427)                      Food and nutrition related knowledge deficit as related to post liver/renal transplant MNT as evidenced by per pt report.     Education: post renal transplant MNT goals and expectations, exercise, hydration, review food safety guidelines and foods to eliminate from her current diet      Nutrition Goals:  Meet nutritional needs   Reduce long-term health risk  Maintain current wt  Start exercise regimen    Interventions:   1. Eat 3 meals daily with appropriate protein, as discussed with less K F/V and more Mg containing foods  2. Adequate hydration at least 80 ounces daily  3. Exercise when able  4. Continue following food safety guidelines      Expected Compliance is:  Comprehension of plan good  Readiness for change good  Ability to meet goals good    Materials Provided were:  List of recommendations    Handout explaining prescribed diet  Lower K F/V   Nutrition following kidney transplant  Ensure coupons   RD contact information  Follow-up: Next MD, or when consulted    Length of visit was:30 minutes    Greta Doom, MS, RDN, CSG, LDN  [phone] pager

## 2017-10-17 ENCOUNTER — Ambulatory Visit
Admission: RE | Admit: 2017-10-17 | Discharge: 2017-10-17 | Disposition: A | Discharge status: Home / Self Care | Attending: Pharmacist Clinician (PhC)/ Clinical Pharmacy Specialist | Admitting: Pharmacist Clinician (PhC)/ Clinical Pharmacy Specialist

## 2017-10-17 ENCOUNTER — Ambulatory Visit
Admission: RE | Admit: 2017-10-17 | Discharge: 2017-10-17 | Disposition: A | Discharge status: Home / Self Care | Attending: Registered" | Admitting: Registered"

## 2017-10-17 ENCOUNTER — Ambulatory Visit: Admission: RE | Admit: 2017-10-17 | Discharge: 2017-10-17 | Disposition: A | Discharge status: Home / Self Care

## 2017-10-17 ENCOUNTER — Ambulatory Visit
Admission: RE | Admit: 2017-10-17 | Discharge: 2017-10-17 | Disposition: A | Discharge status: Home / Self Care | Payer: MEDICARE

## 2017-10-17 DIAGNOSIS — Z79899 Other long term (current) drug therapy: Secondary | ICD-10-CM | POA: Diagnosis not present

## 2017-10-17 DIAGNOSIS — Z48298 Encounter for aftercare following other organ transplant: Secondary | ICD-10-CM | POA: Insufficient documentation

## 2017-10-17 DIAGNOSIS — N022 Recurrent and persistent hematuria with diffuse membranous glomerulonephritis: Secondary | ICD-10-CM | POA: Diagnosis not present

## 2017-10-17 DIAGNOSIS — Z114 Encounter for screening for human immunodeficiency virus [HIV]: Secondary | ICD-10-CM | POA: Diagnosis not present

## 2017-10-17 DIAGNOSIS — Z9189 Other specified personal risk factors, not elsewhere classified: Secondary | ICD-10-CM | POA: Diagnosis not present

## 2017-10-17 DIAGNOSIS — Z940 Kidney transplant status: Secondary | ICD-10-CM | POA: Diagnosis not present

## 2017-10-17 DIAGNOSIS — K769 Liver disease, unspecified: Secondary | ICD-10-CM | POA: Diagnosis not present

## 2017-10-17 DIAGNOSIS — I10 Essential (primary) hypertension: Secondary | ICD-10-CM | POA: Diagnosis not present

## 2017-10-17 DIAGNOSIS — Z9889 Other specified postprocedural states: Secondary | ICD-10-CM | POA: Diagnosis not present

## 2017-10-17 DIAGNOSIS — Z1159 Encounter for screening for other viral diseases: Secondary | ICD-10-CM | POA: Diagnosis not present

## 2017-10-17 DIAGNOSIS — D472 Monoclonal gammopathy: Secondary | ICD-10-CM | POA: Diagnosis not present

## 2017-10-17 DIAGNOSIS — Z944 Liver transplant status: Secondary | ICD-10-CM | POA: Diagnosis not present

## 2017-10-17 LAB — PHOSPHORUS: Phosphate:MCnc:Pt:Ser/Plas:Qn:: 3.1

## 2017-10-17 LAB — CBC W/ AUTO DIFF
BASOPHILS ABSOLUTE COUNT: 0.1 10*9/L (ref 0.0–0.1)
EOSINOPHILS ABSOLUTE COUNT: 0.2 10*9/L (ref 0.0–0.4)
HEMATOCRIT: 29.9 % — ABNORMAL LOW (ref 36.0–46.0)
HEMOGLOBIN: 8.7 g/dL — ABNORMAL LOW (ref 12.0–16.0)
LARGE UNSTAINED CELLS: 4 % (ref 0–4)
LYMPHOCYTES ABSOLUTE COUNT: 0.5 10*9/L — ABNORMAL LOW (ref 1.5–5.0)
MEAN CORPUSCULAR HEMOGLOBIN CONC: 29.1 g/dL — ABNORMAL LOW (ref 31.0–37.0)
MEAN CORPUSCULAR HEMOGLOBIN: 26.7 pg (ref 26.0–34.0)
MEAN PLATELET VOLUME: 8 fL (ref 7.0–10.0)
MONOCYTES ABSOLUTE COUNT: 0.3 10*9/L (ref 0.2–0.8)
NEUTROPHILS ABSOLUTE COUNT: 1 10*9/L — ABNORMAL LOW (ref 2.0–7.5)
PLATELET COUNT: 536 10*9/L — ABNORMAL HIGH (ref 150–440)
RED CELL DISTRIBUTION WIDTH: 16 % — ABNORMAL HIGH (ref 12.0–15.0)
WBC ADJUSTED: 2.1 10*9/L — ABNORMAL LOW (ref 4.5–11.0)

## 2017-10-17 LAB — URINALYSIS
BILIRUBIN UA: NEGATIVE
GLUCOSE UA: NEGATIVE
HYALINE CASTS: 8 /LPF — ABNORMAL HIGH (ref 0–1)
KETONES UA: NEGATIVE
NITRITE UA: NEGATIVE
PH UA: 5 (ref 5.0–9.0)
PROTEIN UA: 30 — AB
RBC UA: 21 /HPF — ABNORMAL HIGH (ref <4)
SPECIFIC GRAVITY UA: 1.014 (ref 1.003–1.030)
SQUAMOUS EPITHELIAL: 5 /HPF (ref 0–5)
UROBILINOGEN UA: 0.2
WBC UA: 84 /HPF — ABNORMAL HIGH (ref 0–5)

## 2017-10-17 LAB — MYELOMA SERUM CHEMISTRIES
GAMMAGLOBULIN; IGA: 71.9 mg/dL (ref 40.0–400.0)
GAMMAGLOBULIN; IGG: 493 mg/dL — ABNORMAL LOW (ref 600–1700)
GAMMAGLOBULIN; IGM: <25 mg/dL — ABNORMAL LOW (ref 35–290)

## 2017-10-17 LAB — COMPREHENSIVE METABOLIC PANEL
ALBUMIN: 4.2 g/dL (ref 3.5–5.0)
ALKALINE PHOSPHATASE: 99 U/L (ref 38–126)
ALT (SGPT): 25 U/L (ref 15–48)
ANION GAP: 12 mmol/L (ref 9–15)
AST (SGOT): 17 U/L (ref 14–38)
BILIRUBIN TOTAL: 0.5 mg/dL (ref 0.0–1.2)
BLOOD UREA NITROGEN: 14 mg/dL (ref 7–21)
BUN / CREAT RATIO: 11
CALCIUM: 9.2 mg/dL (ref 8.5–10.2)
CHLORIDE: 107 mmol/L (ref 98–107)
CO2: 19 mmol/L — ABNORMAL LOW (ref 22.0–30.0)
CREATININE: 1.3 mg/dL — ABNORMAL HIGH (ref 0.60–1.00)
EGFR MDRD AF AMER: 50 mL/min/{1.73_m2} — ABNORMAL LOW (ref >=60)
EGFR MDRD NON AF AMER: 42 mL/min/{1.73_m2} — ABNORMAL LOW (ref >=60)
GLUCOSE RANDOM: 89 mg/dL (ref 65–99)
POTASSIUM: 5.4 mmol/L — ABNORMAL HIGH (ref 3.5–5.0)
PROTEIN TOTAL: 6.2 g/dL — ABNORMAL LOW (ref 6.5–8.3)
SODIUM: 138 mmol/L (ref 135–145)

## 2017-10-17 LAB — LAMBDA FREE, SER: Lab: 1.46

## 2017-10-17 LAB — SMEAR REVIEW

## 2017-10-17 LAB — CBC W/ DIFFERENTIAL
BASOPHILS ABSOLUTE COUNT: 0.4 10*3/uL — ABNORMAL HIGH (ref 0.0–0.2)
BASOPHILS RELATIVE PERCENT: 17 %
EOSINOPHILS ABSOLUTE COUNT: 0.3 10*3/uL (ref 0.0–0.4)
EOSINOPHILS RELATIVE PERCENT: 15 %
HEMATOCRIT: 29.6 % — ABNORMAL LOW (ref 34.0–46.6)
LYMPHOCYTES ABSOLUTE COUNT: 0.4 10*3/uL — ABNORMAL LOW (ref 0.7–3.1)
LYMPHOCYTES RELATIVE PERCENT: 21 %
MEAN CORPUSCULAR HEMOGLOBIN CONC: 30.4 g/dL — ABNORMAL LOW (ref 31.5–35.7)
MEAN CORPUSCULAR HEMOGLOBIN: 27.9 pg (ref 26.6–33.0)
MEAN CORPUSCULAR VOLUME: 92 fL (ref 79–97)
MONOCYTES ABSOLUTE COUNT: 0.1 10*3/uL (ref 0.1–0.9)
MONOCYTES RELATIVE PERCENT: 4 %
NEUTROPHILS RELATIVE PERCENT: 41 %
PLATELET COUNT: 536 10*3/uL — ABNORMAL HIGH (ref 150–379)
RED BLOOD CELL COUNT: 3.23 x10E6/uL — ABNORMAL LOW (ref 3.77–5.28)
WHITE BLOOD CELL COUNT: 2.1 10*3/uL — CL (ref 3.4–10.8)

## 2017-10-17 LAB — PROTEIN / CREATININE RATIO, URINE
CREATININE, URINE: 163.6 mg/dL
PROTEIN URINE: 94.7 mg/dL

## 2017-10-17 LAB — MAGNESIUM: Magnesium:MCnc:Pt:Ser/Plas:Qn:: 1.2 — ABNORMAL LOW

## 2017-10-17 LAB — HEMOGLOBIN: Lab: 9 — ABNORMAL LOW

## 2017-10-17 LAB — IMMUNOGLOBULIN FREE LT CHAINS BLOOD
K/L FLC RATIO: 1.12 (ref 0.26–1.65)
LAMBDA FREE, SER: 1.46 mg/dL (ref 0.57–2.63)

## 2017-10-17 LAB — EGFR MDRD AF AMER
Glomerular filtration rate/1.73 sq M.predicted.black:ArVRat:Pt:Ser/Plas/Bld:Qn:Creatinine-based formula (MDRD): 50 — ABNORMAL LOW

## 2017-10-17 LAB — GAMMA GLUTAMYL TRANSFERASE: Gamma glutamyl transferase:CCnc:Pt:Ser/Plas:Qn:: 32

## 2017-10-17 LAB — BILIRUBIN DIRECT: Bilirubin.glucuronidated:MCnc:Pt:Ser/Plas:Qn:: 0.3

## 2017-10-17 LAB — TACROLIMUS BLOOD: Lab: 8.6

## 2017-10-17 LAB — EOSINOPHILS ABSOLUTE COUNT: Lab: 0.2

## 2017-10-17 LAB — TACROLIMUS, TROUGH: Lab: 4.7

## 2017-10-17 LAB — GAMMAGLOBULIN; IGG: IgG:MCnc:Pt:Ser/Plas:Qn:: 493 — ABNORMAL LOW

## 2017-10-17 LAB — METAMYLOCYTES-LABCORP: Lab: 2 — ABNORMAL HIGH

## 2017-10-17 LAB — PROTEIN URINE: Protein:MCnc:Pt:Urine:Qn:: 94.7

## 2017-10-17 LAB — BILIRUBIN UA: Lab: NEGATIVE

## 2017-10-17 MED ORDER — MYCOPHENOLATE SODIUM 180 MG TABLET,DELAYED RELEASE
ORAL_TABLET | Freq: Two times a day (BID) | ORAL | 3 refills | 0 days | Status: CP
Start: 2017-10-17 — End: 2017-11-08

## 2017-10-17 MED ORDER — ASPIRIN 81 MG TABLET,DELAYED RELEASE
ORAL_TABLET | Freq: Every day | ORAL | 0 refills | 0.00000 days | Status: CP
Start: 2017-10-17 — End: 2017-11-12

## 2017-10-17 MED ORDER — PANTOPRAZOLE 40 MG TABLET,DELAYED RELEASE
ORAL_TABLET | Freq: Every day | ORAL | PRN refills | 0 days | PRN
Start: 2017-10-17 — End: 2018-09-09

## 2017-10-17 NOTE — Unmapped (Signed)
Made daughter aware of improving creatinine and that increased risk donor testing will be done tomorrow.

## 2017-10-17 NOTE — Unmapped (Signed)
A urine specimen was collected at the visit.

## 2017-10-17 NOTE — Unmapped (Signed)
[facility] PHARMACY NOTE  10/17/2017   Michelle Travis  [id number]    Medication changes today:   1.  Increase valcyte to 450 mg daily  2.  Change protonix to prn   3.  Decrease myfortic to 360 mg bid    Education/Adherence tools provided today:  1.provided updated medication list  2. provided additional pill box education  3.  provided additional education on immunosuppression and transplant related medications including reviewing indications of medications, dosing and side effects  4.  discussed adherence reminder tools such as cell phone alarms    Follow up items:  1. goal of understanding indications and dosing of immunosuppression medications  2.  3.    Next visit with pharmacy in 1-2 weeks  ____________________________________________________________________    Michelle Travis is a 61 y.o. female s/p kidney transplant on 09/13/2017 (Liver), 09/13/2017 (Kidney) 2/2 ETOH and membranous nephropathy    Seen by pharmacy today for: medication management and pill box fill and adherence education; last seen by pharmacy first visit     CC:  Patient complaints of  diarrhea    Vitals:    10/17/17 1057   BP: 112/70   Pulse: 76   Temp: 37.3 ??C (99.1 ??F)       Allergies   Allergen Reactions   ??? Latex Swelling and Rash     Discoloration of skin.   Discoloration of skin.    ??? Hydrocodone Itching   ??? Hydrocodone-Acetaminophen Itching       All medications reviewed and updated.     Medication list includes revisions made during today???s encounter    Outpatient Encounter Prescriptions as of 10/17/2017   Medication Sig Dispense Refill   ??? acetaminophen (TYLENOL) 325 MG tablet Take 1-2 tablets (325-650 mg total) by mouth every six (6) hours as needed for pain. 100 tablet 0   ??? aspirin (ECOTRIN) 81 MG tablet Take 1 tablet (81 mg total) by mouth daily. 30 tablet 0   ??? mycophenolate (MYFORTIC) 180 MG EC tablet Take 2 tablets (360 mg total) by mouth Two (2) times a day. 540 tablet 3   ??? pantoprazole (PROTONIX) 40 MG tablet Take 1 tablet (40 mg total) by mouth daily as needed. 30 tablet PRN   ??? predniSONE (DELTASONE) 5 MG tablet Take 1 tablet (5 mg total) by mouth daily. 30 tablet 11   ??? sulfamethoxazole-trimethoprim (BACTRIM,SEPTRA) 400-80 mg per tablet Take 1 tablet (80 mg of trimethoprim total) by mouth 3 (three) times a week. 12 tablet 5   ??? tacrolimus (PROGRAF) 1 MG capsule Take 6 capsules (6 mg total) by mouth Two (2) times a day. 1080 capsule 3   ??? valGANciclovir (VALCYTE) 450 mg tablet Take 1 tablet (450 mg total) by mouth daily. 30 tablet 2   ??? [DISCONTINUED] aspirin (ECOTRIN) 81 MG tablet Take 1 tablet (81 mg total) by mouth daily. HOLD until directed to start by your coordinator. (Patient not taking: Reported on 10/02/2017) 30 tablet 0   ??? [DISCONTINUED] docusate sodium (COLACE) 100 MG capsule Take 1 capsule (100 mg total) by mouth two (2) times a day as needed for constipation. 60 capsule 0   ??? [DISCONTINUED] magnesium oxide (MAG-OX) 400 mg (241.3 mg magnesium) tablet Take 1 tablet (400 mg total) by mouth Two (2) times a day. HOLD until directed to start by your coordinator. (Patient not taking: Reported on 10/02/2017) 60 tablet 11   ??? [DISCONTINUED] mycophenolate (MYFORTIC) 180 MG EC tablet 360 mg at  9 am , 180 mg at 1 pm, 180 mg at 5 pm, and 360 mg at 9 pm 540 tablet 3   ??? [DISCONTINUED] pantoprazole (PROTONIX) 40 MG tablet TAKE 1 TABLET BY MOUTH ONCE DAILY 30 tablet PRN   ??? [DISCONTINUED] polyethylene glycol (MIRALAX) 17 gram/dose powder Take 17 g by mouth daily as needed. 289 g 0     No facility-administered encounter medications on file as of 10/17/2017.      Induction agent : basiliximab    CURRENT IMMUNOSUPPRESSION: tacrolimus 6 mg PO BID  prograf/cyclosporine goal: 8-10   myfortic 360 mg PO tid   prednisone 5 mg daily    Patient complains of diarrhea    IMMUNOSUPPRESSION DRUG LEVELS:  Lab Results   Component Value Date    TACROLIMUS 4.7 10/17/2017    TACROLIMUS 8.6 10/15/2017    TACROLIMUS 8.0 10/12/2017    TACROLIMUS 5.6 10/10/2017    TACROLIMUS 8.8 09/25/2017    TACROLIMUS 6.2 09/24/2017     No results found for: CYCLO  No results found for: EVEROLIMUS  No results found for: SIROLIMUS    Prograf level is accurate 12 hour trough    Graft function: stable  DSA:   Biopsies to date:    WBC/ANC:  wnl    Plan: decrease myfortic to 360 mg bid. Continue to monitor.    ID prophylaxis:   CMV Status: D+/ R+, moderate risk . CMV prophylaxis: valganciclovir 450 mg two days per week x 3 months per protocol.  Estimated Creatinine Clearance: 44.3 mL/min (A) (based on SCr of 1.21 mg/dL (H)).  No results found for: CMVCP  PCP Prophylaxis: bactrim SS 1 tab MWF x 6 months.  Thrush: completed in hospital  Patient is  tolerating infectious prophylaxis well    Plan: increase valcyte to 450 mg daily. Continue to monitor.    CV Prophylaxis: asa 81 mg   The 10-year ASCVD risk score Denman George DC Jr., et al., 2013) is: 7.3%  Statin therapy: Indicated; currently on none  Plan: consider starting statin at a later date. Continue to monitor     BP: Goal < 140/90. Clinic vitals reported above  Home BP ranges: 100-120s/70s  Current meds include: none  Plan: encouraged to increase fluid intake. Continue to monitor    Anemia:  H/H:   Lab Results   Component Value Date    HGB 8.7 (L) 10/17/2017     Lab Results   Component Value Date    HCT 29.9 (L) 10/17/2017     Iron panel:  Lab Results   Component Value Date    IRON 142 06/12/2017    TIBC 325.6 06/12/2017    FERRITIN 83.5 06/12/2017     Lab Results   Component Value Date    LABIRON 44 06/12/2017   Prior ESA use:      Plan: stable. Continue to monitor.     DM:   Lab Results   Component Value Date    A1C 4.4 (L) 09/12/2017   . Goal A1c < 7  History of Dm? No  Established with endocrinologist/PCP for BG managment? No  Plan:  none    Electrolytes: mag low  Meds currently on: none- diarrhea  Plan: monitor diarrhea, if improves, can start mag supplementation. Encouraged high mag foods to consume. Continue to monitor     GI/BM: pt reports diarrhea  Meds currently on: none  Plan: decreased myfortic. Continue to monitor    Pain: pt reports no pain  Meds  currently on: apap prn - not taking  Plan:   Continue to monitor    Bone health:   Vitamin D Level: none available. Goal > 30.   Last DEXA results:  none available  Current meds include:    Plan: Vitamin D level  needs to be drawn with next lab schedule. Continue to monitor.     Women's/Men's Health:  Michelle Travis is a 61 y.o. Female perimenopausal. Patient reports no men's/women's health issues  Plan: Continue to monitor    Adherence: Patient has average understanding of medications; was able to independently identify names/doses of immunosuppressants and OI meds.  Patient  does not fill their own pill box on a regular basis at home- daughter is primary for this  Patient brought medication card:yes  Pill box:did not bring  Plan:  provided moderate adherence counseling/intervention    Other:     Spent approximately 30 minutes on educating this patient and greater than 50% was spent in direct face to face counseling regarding post transplant medication education. Questions and concerns were address to patient's satisfaction.    Patient was reviewed with Dr. Margaretmary Bayley who was agreement with the stated plan:     During this visit, the following was completed:   BG log data assessment  BP log data assessment  Labs ordered and evaluated  complex treatment plan >1 DS   Patient education was completed for 11-24 minutes     All questions/concerns were addressed to the patient's satisfaction.  __________________________________________  Noah Charon, PHARMD, CPP  [facility] [employer]  PAGER [phone]

## 2017-10-17 NOTE — Unmapped (Addendum)
Eat 3 meal daily with protein, more magnesium foods, and less potassium fruits and vegetables   Drink at least 80 ounces of fluids daily  Exercise when you are able   Maintain your weight  Follow food safety guidelines

## 2017-10-18 LAB — MYELOMA WORKUP, SERUM
ALBUMIN (SPE): 3.9 g/dL (ref 3.5–5.0)
ALPHA-2 GLOBULIN: 0.9 g/dL (ref 0.5–1.1)
BETA-1 GLOBULIN: 0.4 g/dL (ref 0.3–0.6)
BETA-2 GLOBULIN: 0.4 g/dL (ref 0.2–0.6)

## 2017-10-18 LAB — HEPATITIS B SURFACE ANTIGEN
HEPATITIS B SURFACE ANTIGEN: NONREACTIVE
Hepatitis B virus surface Ag:PrThr:Pt:Ser:Ord:: NONREACTIVE

## 2017-10-18 LAB — HIV ANTIGEN/ANTIBODY COMBO: HIV 1+2 Ab+HIV1 p24 Ag:PrThr:Pt:Ser/Plas:Ord:IA: NONREACTIVE

## 2017-10-18 LAB — IMMUNOFIXATION ELECTROPHORESIS 1: Lab: 0

## 2017-10-18 MED FILL — SULFAMETHOXAZOLE/TRIMETHO/400-80MG/TABS: SULFAMETHOXAZOLE/TRIMETHO/400-80MG/TABS | 28 days supply | Qty: 12 | Fill #0

## 2017-10-18 MED FILL — PANTOPRAZOLE/40MG/TBEC: PANTOPRAZOLE/40MG/TBEC | 30 days supply | Qty: 30 | Fill #0

## 2017-10-18 MED FILL — PREDNISONE/5MG/TABS: PREDNISONE/5MG/TABS | 30 days supply | Qty: 30 | Fill #0

## 2017-10-18 NOTE — Unmapped (Signed)
[facility]   Patient Onboarding/Medication Counseling    Ms.Ticer is a 61 y.o. female with Liver transplant who I am counseling today on initiation of therapy.    Medication: Prograf, Myfortic  Others: Valcyte, pantoprazole, prednisone, bactrim    Verified patient's date of birth / HIPAA.      Education Provided: ?? patient's daughter declined. Has has extensive counseling with transplant team.    Dose/Administration discussed: Marland Kitchen Verified prograf is now 6 mg BID and Myfortic 180 mg is 2 tabs BID      Verified therapy is appropriate and should continue      Delivery Information    Verified delivery address in FSI and reviewed medication storage requirement.    Scheduled delivery date: Friday, Dec 21 for prednisone, protonix, and bactrim, and scheduled for Thurs, Dec 17 for Prograf, Myfortic, and Valcyte    Explained that we ship using UPS and this shipment will not require a signature.      Explained the services we provide at [facility] and that each month we would call to set up refills.  Stressed importance of returning phone calls so that we could ensure they receive their medications in time each month.  Informed patient that we should be setting up refills 7-10 days prior to when they will run out of medication.  Informed patient that welcome packet will be sent.      Patient verbalized understanding of the above information as well as how to contact the pharmacy at [phone] option 4 with any questions/concerns.        Patient Specific Needs      ? Patient has no physical or cognitive barriers.    ? Patient prefers to have medications discussed with  Family Member (daughter Elease Hashimoto)     ? Patient is able to read and understand education materials at a high school level or above.        Lanney Gins  [facility] Specialty Pharmacist

## 2017-10-19 DIAGNOSIS — Z944 Liver transplant status: Secondary | ICD-10-CM | POA: Diagnosis not present

## 2017-10-19 DIAGNOSIS — Z940 Kidney transplant status: Secondary | ICD-10-CM | POA: Diagnosis not present

## 2017-10-19 LAB — HEPATITIS C RNA, QUANTITATIVE, PCR

## 2017-10-19 LAB — HBV DNA COMMENT: Lab: 0

## 2017-10-19 LAB — HEPATITIS B DNA, ULTRAQUANTITATIVE, PCR

## 2017-10-19 LAB — HCV RNA: Hepatitis C virus RNA:PrThr:Pt:Ser/Plas:Ord:Probe.amp.tar: NOT DETECTED

## 2017-10-19 NOTE — Unmapped (Signed)
Confirmed with patient no missed doses and labs drawn today which are not back yet.  Per PharmD Nedra Hai will wait on repeat labs.

## 2017-10-19 NOTE — Unmapped (Signed)
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 She presents to clinic today for staple/suture removal from transplant incisions.     All staples and sutures removed from abdomen. Replaced with steri-strips to mercedes and kidney transplant incisions. Encouraged her to continue limiting her heavy lifting for a few more weeks and slowly increase amount lifting. Ok to drive if no longer taking pain medications - her daughter reports that she doesn't drive normally.    I spent ~20 minutes removing the numerous staples and sutures across her abdomen.     Follow Up Clinic: 2/18  Labs: 3x/week; 1-3 month increased risk donor testing     Subjective   Michelle Travis is a 61 y.o.  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 She did well postoperatively; however, her hospital course was complicated by pneumomediastinum and fluctuating creatinine.  The pneumomediastinum was addressed with Thoracic surgery who obtained a gastrograffin swallow study which demonstrated no leak, and the issue resolved by discharge. Her delay of kidney response was managed initially with CRRT and transitioned to HD, with last HD session the day prior to discharge. The patient was able to void spontaneously 11/21 after foley removal; however, she had persistently low UOP. She was discharged 09/25/17 (POD 12).    Michelle Travis denies acute complaint today with regard to her surgical healing. Denies any constitutional symptoms, no CP, SOB, N/V/D/C, good appetite and PO intake.  ??  Objective     Vitals:    10/17/17 1246   BP: 127/74   Pulse: 69   Temp: 36.8 ??C (98.2 ??F)   TempSrc: Tympanic   Weight: 66.7 kg (147 lb)   Height: 160 cm (5' 2.99)      Body mass index is 26.05 kg/m??.     Physical Exam:    General Appearance:    No acute distress  Lungs:                 Clear to auscultation bilaterally  Heart:                            Regular rate and rhythm  Abdomen:                 Soft, round,non-tender, Non-distended. Mercedes and LLQ kidney transplant Incisions C/D/I, no drainage, staples intact, 3 old JP drain sites well healed with sutures intact. - all staples and sutures removed    _________________________________________________        Ermalinda Barrios, DNP, APRN, FNP-C  [facility] for [facility]  [address]  [place], [place]  [address]

## 2017-10-19 NOTE — Unmapped (Signed)
I left a message for her to return my call to confirm she rec'vd my message

## 2017-10-19 NOTE — Unmapped (Signed)
Spoke with patient and r/s VIR to 1/3 per her request

## 2017-10-20 LAB — POTASSIUM: Lab: 5

## 2017-10-20 LAB — CBC W/ DIFFERENTIAL
BASOPHILS ABSOLUTE COUNT: 0.2 10*3/uL (ref 0.0–0.2)
BASOPHILS RELATIVE PERCENT: 9 %
EOSINOPHILS ABSOLUTE COUNT: 0.3 10*3/uL (ref 0.0–0.4)
EOSINOPHILS RELATIVE PERCENT: 15 %
HEMATOCRIT: 28.4 % — ABNORMAL LOW (ref 34.0–46.6)
HEMOGLOBIN: 8.9 g/dL — ABNORMAL LOW (ref 11.1–15.9)
LYMPHOCYTES ABSOLUTE COUNT: 0.5 10*3/uL — ABNORMAL LOW (ref 0.7–3.1)
LYMPHOCYTES RELATIVE PERCENT: 27 %
MEAN CORPUSCULAR HEMOGLOBIN CONC: 31.3 g/dL — ABNORMAL LOW (ref 31.5–35.7)
MEAN CORPUSCULAR HEMOGLOBIN: 27 pg (ref 26.6–33.0)
MEAN CORPUSCULAR VOLUME: 86 fL (ref 79–97)
MONOCYTES ABSOLUTE COUNT: 0.2 10*3/uL (ref 0.1–0.9)
MONOCYTES RELATIVE PERCENT: 12 %
NEUTROPHILS ABSOLUTE COUNT: 0.7 10*3/uL — ABNORMAL LOW (ref 1.4–7.0)
NEUTROPHILS RELATIVE PERCENT: 37 %
PLATELET COUNT: 524 10*3/uL — ABNORMAL HIGH (ref 150–379)
RED BLOOD CELL COUNT: 3.3 x10E6/uL — ABNORMAL LOW (ref 3.77–5.28)
RED CELL DISTRIBUTION WIDTH: 15.8 % — ABNORMAL HIGH (ref 12.3–15.4)

## 2017-10-20 LAB — COMPREHENSIVE METABOLIC PANEL
A/G RATIO: 1.9 (ref 1.2–2.2)
ALBUMIN: 4.1 g/dL (ref 3.6–4.8)
ALKALINE PHOSPHATASE: 109 IU/L (ref 39–117)
ALT (SGPT): 11 IU/L (ref 0–32)
AST (SGOT): 15 IU/L (ref 0–40)
BILIRUBIN TOTAL: 0.3 mg/dL (ref 0.0–1.2)
BLOOD UREA NITROGEN: 11 mg/dL (ref 8–27)
BUN / CREAT RATIO: 9 — ABNORMAL LOW (ref 12–28)
CALCIUM: 9 mg/dL (ref 8.7–10.3)
CO2: 18 mmol/L — ABNORMAL LOW (ref 20–29)
CREATININE: 1.21 mg/dL — ABNORMAL HIGH (ref 0.57–1.00)
GFR MDRD AF AMER: 55 mL/min/{1.73_m2} — ABNORMAL LOW
GFR MDRD NON AF AMER: 48 mL/min/{1.73_m2} — ABNORMAL LOW
GLOBULIN, TOTAL: 2.2 g/dL (ref 1.5–4.5)
GLUCOSE: 94 mg/dL (ref 65–99)
POTASSIUM: 5 mmol/L (ref 3.5–5.2)
TOTAL PROTEIN: 6.3 g/dL (ref 6.0–8.5)

## 2017-10-20 LAB — GAMMA GLUTAMYL TRANSFERASE: Lab: 30

## 2017-10-20 LAB — MAGNESIUM: Lab: 1.2 — ABNORMAL LOW

## 2017-10-20 LAB — BILIRUBIN DIRECT: Lab: 0.11

## 2017-10-20 LAB — PHOSPHORUS, SERUM: Lab: 2.4 — ABNORMAL LOW

## 2017-10-20 LAB — MEAN CORPUSCULAR VOLUME: Lab: 86

## 2017-10-21 LAB — TACROLIMUS BLOOD: Lab: 14.1

## 2017-10-22 DIAGNOSIS — Z940 Kidney transplant status: Secondary | ICD-10-CM | POA: Diagnosis not present

## 2017-10-22 DIAGNOSIS — Z944 Liver transplant status: Secondary | ICD-10-CM | POA: Diagnosis not present

## 2017-10-23 LAB — MAGNESIUM: Lab: 1.2 — ABNORMAL LOW

## 2017-10-23 LAB — CBC W/ DIFFERENTIAL
BASOPHILS ABSOLUTE COUNT: 0.2 10*3/uL (ref 0.0–0.2)
BASOPHILS RELATIVE PERCENT: 9 %
EOSINOPHILS ABSOLUTE COUNT: 0.4 10*3/uL (ref 0.0–0.4)
EOSINOPHILS RELATIVE PERCENT: 17 %
HEMOGLOBIN: 8.5 g/dL — ABNORMAL LOW (ref 11.1–15.9)
LYMPHOCYTES ABSOLUTE COUNT: 0.6 10*3/uL — ABNORMAL LOW (ref 0.7–3.1)
LYMPHOCYTES RELATIVE PERCENT: 25 %
MEAN CORPUSCULAR HEMOGLOBIN CONC: 30.8 g/dL — ABNORMAL LOW (ref 31.5–35.7)
MEAN CORPUSCULAR HEMOGLOBIN: 26.1 pg — ABNORMAL LOW (ref 26.6–33.0)
MEAN CORPUSCULAR VOLUME: 85 fL (ref 79–97)
MONOCYTES ABSOLUTE COUNT: 0.3 10*3/uL (ref 0.1–0.9)
MONOCYTES RELATIVE PERCENT: 11 %
NEUTROPHILS RELATIVE PERCENT: 36 %
PLATELET COUNT: 505 10*3/uL — ABNORMAL HIGH (ref 150–379)
RED BLOOD CELL COUNT: 3.26 x10E6/uL — ABNORMAL LOW (ref 3.77–5.28)
RED CELL DISTRIBUTION WIDTH: 15.7 % — ABNORMAL HIGH (ref 12.3–15.4)
WHITE BLOOD CELL COUNT: 2.4 10*3/uL — CL (ref 3.4–10.8)

## 2017-10-23 LAB — COMPREHENSIVE METABOLIC PANEL
A/G RATIO: 1.8 (ref 1.2–2.2)
ALBUMIN: 4.1 g/dL (ref 3.6–4.8)
ALKALINE PHOSPHATASE: 111 IU/L (ref 39–117)
ALT (SGPT): 15 IU/L (ref 0–32)
AST (SGOT): 15 IU/L (ref 0–40)
BILIRUBIN TOTAL: 0.2 mg/dL (ref 0.0–1.2)
BLOOD UREA NITROGEN: 11 mg/dL (ref 8–27)
BUN / CREAT RATIO: 8 — ABNORMAL LOW (ref 12–28)
CALCIUM: 8.7 mg/dL (ref 8.7–10.3)
CHLORIDE: 107 mmol/L — ABNORMAL HIGH (ref 96–106)
CO2: 18 mmol/L — ABNORMAL LOW (ref 20–29)
CREATININE: 1.44 mg/dL — ABNORMAL HIGH (ref 0.57–1.00)
GLUCOSE: 86 mg/dL (ref 65–99)
POTASSIUM: 5.1 mmol/L (ref 3.5–5.2)
SODIUM: 138 mmol/L (ref 134–144)
TOTAL PROTEIN: 6.4 g/dL (ref 6.0–8.5)

## 2017-10-23 LAB — MONOCYTES ABSOLUTE COUNT: Lab: 0.3

## 2017-10-23 LAB — BLASTS/BLAST LIKE CELLS: Lab: 2 — ABNORMAL HIGH

## 2017-10-23 LAB — BILIRUBIN TOTAL: Lab: 0.2

## 2017-10-23 LAB — BILIRUBIN DIRECT: Lab: 0.1

## 2017-10-23 LAB — BILIRUBIN, DIRECT: BILIRUBIN DIRECT: 0.1 mg/dL (ref 0.00–0.40)

## 2017-10-23 LAB — PHOSPHORUS, SERUM: Lab: 2.6

## 2017-10-23 LAB — GAMMA GLUTAMYL TRANSFERASE: Lab: 24

## 2017-10-24 DIAGNOSIS — Z944 Liver transplant status: Secondary | ICD-10-CM | POA: Diagnosis not present

## 2017-10-24 DIAGNOSIS — Z940 Kidney transplant status: Secondary | ICD-10-CM | POA: Diagnosis not present

## 2017-10-24 LAB — TACROLIMUS BLOOD: Lab: 12.4

## 2017-10-24 MED ORDER — TACROLIMUS 1 MG CAPSULE
ORAL_CAPSULE | 3 refills | 0 days | Status: CP
Start: 2017-10-24 — End: 2017-11-08

## 2017-10-24 NOTE — Unmapped (Signed)
Per Dr. Celine Mans patient to decrease Prograf to 6 mg in am and 5 mg in pm. Patient verbalized understanding.

## 2017-10-25 LAB — COMPREHENSIVE METABOLIC PANEL
ALBUMIN: 3.8 g/dL (ref 3.6–4.8)
ALKALINE PHOSPHATASE: 111 IU/L (ref 39–117)
ALT (SGPT): 12 IU/L (ref 0–32)
AST (SGOT): 11 IU/L (ref 0–40)
BILIRUBIN TOTAL: 0.3 mg/dL (ref 0.0–1.2)
BLOOD UREA NITROGEN: 14 mg/dL (ref 8–27)
BUN / CREAT RATIO: 12 (ref 12–28)
CALCIUM: 8.9 mg/dL (ref 8.7–10.3)
CHLORIDE: 108 mmol/L — ABNORMAL HIGH (ref 96–106)
CO2: 16 mmol/L — ABNORMAL LOW (ref 20–29)
CREATININE: 1.18 mg/dL — ABNORMAL HIGH (ref 0.57–1.00)
GFR MDRD AF AMER: 57 mL/min/{1.73_m2} — ABNORMAL LOW
GFR MDRD NON AF AMER: 50 mL/min/{1.73_m2} — ABNORMAL LOW
GLOBULIN, TOTAL: 2 g/dL (ref 1.5–4.5)
GLUCOSE: 87 mg/dL (ref 65–99)
SODIUM: 138 mmol/L (ref 134–144)

## 2017-10-25 LAB — CBC W/ DIFFERENTIAL
BANDED NEUTROPHILS ABSOLUTE COUNT: 0 10*3/uL (ref 0.0–0.1)
EOSINOPHILS ABSOLUTE COUNT: 0.3 10*3/uL (ref 0.0–0.4)
EOSINOPHILS RELATIVE PERCENT: 17 %
HEMATOCRIT: 27.1 % — ABNORMAL LOW (ref 34.0–46.6)
HEMOGLOBIN: 8.6 g/dL — ABNORMAL LOW (ref 11.1–15.9)
IMMATURE GRANULOCYTES: 0 %
LYMPHOCYTES ABSOLUTE COUNT: 0.5 10*3/uL — ABNORMAL LOW (ref 0.7–3.1)
LYMPHOCYTES RELATIVE PERCENT: 25 %
MEAN CORPUSCULAR HEMOGLOBIN CONC: 31.7 g/dL (ref 31.5–35.7)
MEAN CORPUSCULAR HEMOGLOBIN: 26.7 pg (ref 26.6–33.0)
MEAN CORPUSCULAR VOLUME: 84 fL (ref 79–97)
MONOCYTES ABSOLUTE COUNT: 0.3 10*3/uL (ref 0.1–0.9)
MONOCYTES RELATIVE PERCENT: 16 %
NEUTROPHILS ABSOLUTE COUNT: 0.8 10*3/uL — ABNORMAL LOW (ref 1.4–7.0)
NEUTROPHILS RELATIVE PERCENT: 37 %
PLATELET COUNT: 515 10*3/uL — ABNORMAL HIGH (ref 150–379)
RED CELL DISTRIBUTION WIDTH: 15.8 % — ABNORMAL HIGH (ref 12.3–15.4)
WHITE BLOOD CELL COUNT: 2.1 10*3/uL — CL (ref 3.4–10.8)

## 2017-10-25 LAB — GAMMA GLUTAMYL TRANSFERASE: Lab: 22

## 2017-10-25 LAB — MAGNESIUM: Lab: 1.2 — ABNORMAL LOW

## 2017-10-25 LAB — PHOSPHORUS, SERUM: Lab: 2.5

## 2017-10-25 LAB — MONOCYTES ABSOLUTE COUNT: Lab: 0.3

## 2017-10-25 LAB — BILIRUBIN DIRECT: Lab: 0.12

## 2017-10-25 LAB — PHOSPHORUS: PHOSPHORUS, SERUM: 2.5 mg/dL (ref 2.5–4.5)

## 2017-10-25 LAB — GFR MDRD AF AMER: Lab: 57 — ABNORMAL LOW

## 2017-10-25 NOTE — Unmapped (Signed)
Per NP Daphine Deutscher patient can now do weekly labs.  Daughter verbalized understanding.

## 2017-10-26 LAB — TACROLIMUS BLOOD: Lab: 15

## 2017-10-30 NOTE — Unmapped (Signed)
Called local [facility] pharmacy and confirmed they have mycophenolate in stock which patient is about to run out of.  Daughter confirmed she will pick it up today.

## 2017-10-31 DIAGNOSIS — Z944 Liver transplant status: Secondary | ICD-10-CM | POA: Diagnosis not present

## 2017-10-31 DIAGNOSIS — Z940 Kidney transplant status: Secondary | ICD-10-CM | POA: Diagnosis not present

## 2017-11-01 ENCOUNTER — Ambulatory Visit: Admit: 2017-11-01 | Discharge: 2017-11-01 | Payer: MEDICARE

## 2017-11-01 DIAGNOSIS — T198XXA Foreign body in other parts of genitourinary tract, initial encounter: Secondary | ICD-10-CM | POA: Diagnosis not present

## 2017-11-01 DIAGNOSIS — Z4901 Encounter for fitting and adjustment of extracorporeal dialysis catheter: Secondary | ICD-10-CM | POA: Diagnosis not present

## 2017-11-01 DIAGNOSIS — Z940 Kidney transplant status: Secondary | ICD-10-CM | POA: Diagnosis not present

## 2017-11-01 DIAGNOSIS — Z452 Encounter for adjustment and management of vascular access device: Secondary | ICD-10-CM | POA: Diagnosis not present

## 2017-11-01 LAB — COMPREHENSIVE METABOLIC PANEL
A/G RATIO: 1.9 (ref 1.2–2.2)
ALBUMIN: 4.2 g/dL (ref 3.6–4.8)
ALKALINE PHOSPHATASE: 121 IU/L — ABNORMAL HIGH (ref 39–117)
ALT (SGPT): 13 IU/L (ref 0–32)
BILIRUBIN TOTAL: 0.2 mg/dL (ref 0.0–1.2)
BLOOD UREA NITROGEN: 13 mg/dL (ref 8–27)
BUN / CREAT RATIO: 12 (ref 12–28)
CALCIUM: 8.9 mg/dL (ref 8.7–10.3)
CHLORIDE: 107 mmol/L — ABNORMAL HIGH (ref 96–106)
CO2: 17 mmol/L — ABNORMAL LOW (ref 20–29)
GFR MDRD AF AMER: 62 mL/min/{1.73_m2}
GFR MDRD NON AF AMER: 54 mL/min/{1.73_m2} — ABNORMAL LOW
GLOBULIN, TOTAL: 2.2 g/dL (ref 1.5–4.5)
GLUCOSE: 80 mg/dL (ref 65–99)
SODIUM: 141 mmol/L (ref 134–144)
TOTAL PROTEIN: 6.4 g/dL (ref 6.0–8.5)

## 2017-11-01 LAB — CBC W/ DIFFERENTIAL
BANDED NEUTROPHILS ABSOLUTE COUNT: 0.2 10*3/uL — ABNORMAL HIGH (ref 0.0–0.1)
BASOPHILS ABSOLUTE COUNT: 0.1 10*3/uL (ref 0.0–0.2)
BASOPHILS RELATIVE PERCENT: 4 %
EOSINOPHILS ABSOLUTE COUNT: 0.6 10*3/uL — ABNORMAL HIGH (ref 0.0–0.4)
HEMATOCRIT: 30.2 % — ABNORMAL LOW (ref 34.0–46.6)
HEMOGLOBIN: 9.3 g/dL — ABNORMAL LOW (ref 11.1–15.9)
IMMATURE GRANULOCYTES: 4 %
LYMPHOCYTES RELATIVE PERCENT: 24 %
MEAN CORPUSCULAR HEMOGLOBIN CONC: 30.8 g/dL — ABNORMAL LOW (ref 31.5–35.7)
MEAN CORPUSCULAR HEMOGLOBIN: 25.3 pg — ABNORMAL LOW (ref 26.6–33.0)
MEAN CORPUSCULAR VOLUME: 82 fL (ref 79–97)
MONOCYTES ABSOLUTE COUNT: 0.3 10*3/uL (ref 0.1–0.9)
MONOCYTES RELATIVE PERCENT: 8 %
NEUTROPHILS ABSOLUTE COUNT: 1.3 10*3/uL — ABNORMAL LOW (ref 1.4–7.0)
NEUTROPHILS RELATIVE PERCENT: 40 %
PLATELET COUNT: 528 10*3/uL — ABNORMAL HIGH (ref 150–379)
RED BLOOD CELL COUNT: 3.68 x10E6/uL — ABNORMAL LOW (ref 3.77–5.28)
RED CELL DISTRIBUTION WIDTH: 15.8 % — ABNORMAL HIGH (ref 12.3–15.4)
WHITE BLOOD CELL COUNT: 2.8 10*3/uL — ABNORMAL LOW (ref 3.4–10.8)

## 2017-11-01 LAB — PHOSPHORUS, SERUM: Lab: 2.4 — ABNORMAL LOW

## 2017-11-01 LAB — MAGNESIUM: Lab: 1.2 — ABNORMAL LOW

## 2017-11-01 LAB — GAMMA GLUTAMYL TRANSFERASE: Lab: 40

## 2017-11-01 LAB — POTASSIUM: Lab: 4.6

## 2017-11-01 LAB — GAMMA GT: GAMMA GLUTAMYL TRANSFERASE: 40 IU/L (ref 0–60)

## 2017-11-01 LAB — MONOCYTES RELATIVE PERCENT: Lab: 8

## 2017-11-01 LAB — BILIRUBIN DIRECT: Lab: 0.11

## 2017-11-01 NOTE — Unmapped (Signed)
Spoke with patient daughter about concern for bubbles in her urine yesterday but none today.  Per Kidney Coordinator we get a UPC when we see her so we can assess during her next clinical visit.  If there is frothy urine daughter educated to call us so we can do UPC Ratio sooner.  Asked how long I+O's have to be maintained.  Let her know I will ask her primary coordinator when she returns.

## 2017-11-01 NOTE — Unmapped (Addendum)
[facility]:  Taking Care of Yourself After Cystoscopy Procedures    *Drink plenty of water for a day or two following your procedure.  Try to have about 8 ounces (one cup) at a time, and do this 6 times or more per day.  (If you have fluid restrictions, please ask the nurse or doctor for advice).    *AVOID alcoholic, carbonated and caffeinated drinks for a day or two, as they may cause uncomfortable symptoms.    *For the first 8 hours after the procedure, your urine may be pink or red in color.  Small clots or a few drops of blood can be a normal side effect of the instruments.  Large amounts of bleeding or difficulty urinating are not normal.  Call your doctor if this happens.    *You may experience some mild discomfort of a burning sensation with urination after having this procedure.  If it does not improve, or if other symptoms appear (fever, chills, or difficulty emptying), call your doctor.    *You may return to normal daily activities such as work, school, driving, exercising and housework.    *If your doctor gave you a prescription, take it as ordered.    *If you need a return appointment, the secretary will make it for you when you check out. To contact the Urology Clinic during business hours, call [phone].    *[facility] Operator can be reached at [phone] if you need to get in contact with your doctor.  After the hours, the operator can page the doctor on call for urgent concerns:    Urology Patients should ask for the Urology resident “on call”.    You can get more immediate assistance at the Emergency Room or Urgent Care if necessary.

## 2017-11-01 NOTE — Unmapped (Signed)
Procedures  61yF s/p renal transplant.  Cr 1.2    Cystoscopy, ureteral stent removal  Timeout was performed immediately prior to the procedure.    The patient was prepped and draped in the usual sterile fashion.  Flexible cystoscopy was performed.  The indwelling transplant ureteral stent was visualized, grasped, and removed intact.  The patient tolerated the procedure well and was not given one dose of antibiotic prophylaxis afterwards.    Plan: return prn

## 2017-11-01 NOTE — Unmapped (Signed)
Michelle Travis [phone]

## 2017-11-01 NOTE — Unmapped (Signed)
[facility] - Operative Note     VIR Post-Procedure Note    Procedure Name: Tunneled line removal     Pre-Op Diagnosis: Renal failure s/p kidney transplant, dialysis catheter no longer needed     Post-Op Diagnosis: Same as pre-operative diagnosis    VIR Providers    Attending: Dr. Ammie Dalton  Resident/Fellow/APP: Evalina Field, PA-C    Description of procedure: Tunneled line removal with local anesthesia.     Estimated Blood Loss: approximately 1 mL  Complications: None    See detailed procedure note with images in PACS [facility]).    The patient tolerated the procedure well without incident or complication and left the room in stable condition.    Evalina Field  11/01/2017 11:14 AM

## 2017-11-02 LAB — TACROLIMUS BLOOD: Lab: 7.3

## 2017-11-05 ENCOUNTER — Telehealth: Payer: Self-pay | Admitting: *Deleted

## 2017-11-05 MED ORDER — VALGANCICLOVIR 450 MG TABLET
ORAL_TABLET | Freq: Every day | ORAL | 0 refills | 0.00000 days | Status: CP
Start: 2017-11-05 — End: 2017-12-17

## 2017-11-05 NOTE — Unmapped (Addendum)
Reviewed labs with NP Daphine Deutscher and will continue to monitor labs.  Reviewed U/A with RN Rodegast and no further intervention at this time. Confirmed with daughter that patient did not miss any doses of medication in last week and confirmedepeat labs this week on Wednesday will be done.

## 2017-11-05 NOTE — Unmapped (Signed)
Addended by: Mariea Stable A on: 11/05/2017 03:27 PM     Modules accepted: Orders

## 2017-11-05 NOTE — Telephone Encounter (Signed)
Received a call from Ms. Fehring that she is no longer on dialysis, kidney transplant is working and she does not need to come in for followup on 1-10 with Dr. Oneida Alar.

## 2017-11-06 NOTE — Unmapped (Signed)
[facility] Refill and Clinical Coordination Note  Medication(s): MYFORTIC  ALSO DOING clinical followup for valcyte and prograf even though not filling today in order to keep dates the same    Michelle Travis, DOB: [birth year]  Phone: [phone] (home) [phone] (work), Alternate phone contact: N/A  Shipping address: [address]  [place] [address]  Phone or address changes today?: No  All above HIPAA information verified.  Insurance changes? No    Completed refill and clinical call assessment today to schedule patient's medication shipment from the [facility] [phone]).      MEDICATION RECONCILIATION    Confirmed the medication and dosage are correct and have not changed: No, patient reports changes to the regimen as follows: prograf now 6am and 5pm, and myfortic is 2bid - will request new refills     Were there any changes to your medication(s) in the past month:  No, there are no changes reported at this time.    ADHERENCE    Is this medicine transplant or covered by Medicare Part B? No.    Myfortic 180 mg   Quantity filled last month: 180 (filled in Nov)   # of tablets left on hand: 40  Prograf 1 mg   Quantity filled last month: [facility] (90ds)   # of tablets left on hand: 80 days supply (not filling at this time)  valganciclovir 450mg   Last [facility] in Dec  Left: 28 (not filling at this time)      Did you miss any doses in the past 4 weeks? No missed doses reported.  Adherence counseling provided? Not needed     SIDE EFFECT MANAGEMENT    Are you tolerating your medication?:  Michelle Travis reports tolerating the medication.  Side effect management discussed: None  Had diarrhea and appetite suppression at first but now better    Therapy is appropriate and should be continued.    Evidence of clinical benefit: See Epic note from 10/17/17      FINANCIAL/SHIPPING    Delivery Scheduled: Yes, Expected medication delivery date: 11/13/17. However, Rx request for new prescription was sent to the provider as the patient reports a change in dose.   Additional medications refilled: sulfa-smz, pantoprazole, prednisone, aspirin (needs refill) - total 5rx    Michelle Travis did not have any additional questions at this time.    Delivery address validated in [facility]SI scheduling system: Yes, address listed above is correct.      We will follow up with patient monthly for standard refill processing and delivery.      Thank you,  [name]   [facility] Shared [facility] Specialty Pharmacist

## 2017-11-06 NOTE — Unmapped (Signed)
Patient request for RX refill.

## 2017-11-07 DIAGNOSIS — Z940 Kidney transplant status: Secondary | ICD-10-CM | POA: Diagnosis not present

## 2017-11-07 DIAGNOSIS — Z944 Liver transplant status: Secondary | ICD-10-CM | POA: Diagnosis not present

## 2017-11-08 ENCOUNTER — Ambulatory Visit: Payer: Medicare HMO | Admitting: Vascular Surgery

## 2017-11-08 ENCOUNTER — Encounter (HOSPITAL_COMMUNITY): Payer: Medicare HMO

## 2017-11-08 LAB — COMPREHENSIVE METABOLIC PANEL
A/G RATIO: 2 (ref 1.2–2.2)
ALBUMIN: 4.3 g/dL (ref 3.6–4.8)
ALKALINE PHOSPHATASE: 109 IU/L (ref 39–117)
ALT (SGPT): 9 IU/L (ref 0–32)
BUN / CREAT RATIO: 16 (ref 12–28)
CALCIUM: 9.2 mg/dL (ref 8.7–10.3)
CHLORIDE: 106 mmol/L (ref 96–106)
CO2: 22 mmol/L (ref 20–29)
CREATININE: 0.73 mg/dL (ref 0.57–1.00)
GFR MDRD AF AMER: 102 mL/min/{1.73_m2}
GFR MDRD NON AF AMER: 89 mL/min/{1.73_m2}
GLOBULIN, TOTAL: 2.1 g/dL (ref 1.5–4.5)
GLUCOSE: 97 mg/dL (ref 65–99)
POTASSIUM: 5.1 mmol/L (ref 3.5–5.2)
SODIUM: 143 mmol/L (ref 134–144)

## 2017-11-08 LAB — CBC W/ DIFFERENTIAL
BANDED NEUTROPHILS ABSOLUTE COUNT: 0 10*3/uL (ref 0.0–0.1)
BASOPHILS ABSOLUTE COUNT: 0.1 10*3/uL (ref 0.0–0.2)
BASOPHILS RELATIVE PERCENT: 5 %
EOSINOPHILS ABSOLUTE COUNT: 0.3 10*3/uL (ref 0.0–0.4)
EOSINOPHILS RELATIVE PERCENT: 11 %
HEMATOCRIT: 34 % (ref 34.0–46.6)
IMMATURE GRANULOCYTES: 1 %
LYMPHOCYTES RELATIVE PERCENT: 35 %
MEAN CORPUSCULAR HEMOGLOBIN CONC: 29.7 g/dL — ABNORMAL LOW (ref 31.5–35.7)
MEAN CORPUSCULAR HEMOGLOBIN: 25.2 pg — ABNORMAL LOW (ref 26.6–33.0)
MONOCYTES ABSOLUTE COUNT: 0.2 10*3/uL (ref 0.1–0.9)
MONOCYTES RELATIVE PERCENT: 9 %
NEUTROPHILS ABSOLUTE COUNT: 0.9 10*3/uL — ABNORMAL LOW (ref 1.4–7.0)
NEUTROPHILS RELATIVE PERCENT: 39 %
PLATELET COUNT: 546 10*3/uL — ABNORMAL HIGH (ref 150–379)
RED BLOOD CELL COUNT: 4.01 x10E6/uL (ref 3.77–5.28)
RED CELL DISTRIBUTION WIDTH: 15.5 % — ABNORMAL HIGH (ref 12.3–15.4)
WHITE BLOOD CELL COUNT: 2.3 10*3/uL — CL (ref 3.4–10.8)

## 2017-11-08 LAB — BILIRUBIN DIRECT: Lab: 0.08

## 2017-11-08 LAB — EOSINOPHILS RELATIVE PERCENT: Lab: 11

## 2017-11-08 LAB — GAMMA GLUTAMYL TRANSFERASE: Lab: 25

## 2017-11-08 LAB — MAGNESIUM: Lab: 1.5 — ABNORMAL LOW

## 2017-11-08 LAB — TOTAL PROTEIN: Lab: 6.4

## 2017-11-08 LAB — PHOSPHORUS, SERUM: Lab: 2 — ABNORMAL LOW

## 2017-11-08 MED ORDER — MYFORTIC 180 MG TABLET,DELAYED RELEASE: 360 mg | tablet | 14 refills | 0 days

## 2017-11-08 MED ORDER — TACROLIMUS 1 MG CAPSULE
ORAL | 3 refills | 0.00000 days | Status: CP
Start: 2017-11-08 — End: 2017-11-08

## 2017-11-08 MED ORDER — TACROLIMUS 1 MG CAPSULE: each | 3 refills | 0 days

## 2017-11-08 MED ORDER — MYFORTIC 180 MG TABLET,DELAYED RELEASE
ORAL_TABLET | ORAL | 13 refills | 0.00000 days
Start: 2017-11-08 — End: 2017-11-08

## 2017-11-08 MED ORDER — MYCOPHENOLATE SODIUM 180 MG TABLET,DELAYED RELEASE
ORAL_TABLET | Freq: Two times a day (BID) | ORAL | 3 refills | 0 days | Status: CP
Start: 2017-11-08 — End: 2017-11-22

## 2017-11-09 MED ORDER — TBO-FILGRASTIM 300 MCG/0.5 ML SUBCUTANEOUS SYRINGE
INJECTION | INTRAMUSCULAR | 0 refills | 0.00000 days | Status: CP
Start: 2017-11-09 — End: 2017-11-09

## 2017-11-09 MED ORDER — TBO-FILGRASTIM 300 MCG/0.5 ML SUBCUTANEOUS SYRINGE: Syringe | 0 refills | 0 days | Status: AC

## 2017-11-09 NOTE — Unmapped (Addendum)
Per Kidney Coordinator patient no longer needs to check I+O's but to check for frothy urine.  Daughter verbalized understanding.  Per NP Daphine Deutscher patient to get Neupogen 300 mcg shipped to her for injection due to low ANC.  Patient daughter voiced conern over the weather concern so for now we will work on trying to ship it.      [facility] [phone] but they stated they would not be able to do injection.      Left VM for PCP to see if they could teach patient how to administered Neupogen.

## 2017-11-11 LAB — TACROLIMUS BLOOD: Lab: 9.5

## 2017-11-11 LAB — TACROLIMUS LEVEL: TACROLIMUS BLOOD: 9.5 ng/mL (ref 2.0–20.0)

## 2017-11-11 MED FILL — PANTOPRAZOLE/40MG/TBEC: PANTOPRAZOLE/40MG/TBEC | 30 days supply | Qty: 30 | Fill #1

## 2017-11-11 MED FILL — MYFORTIC/180MG/TAB: MYFORTIC/180MG/TAB | 30 days supply | Qty: 120 | Fill #0

## 2017-11-11 MED FILL — SULFAMETHOXAZOLE/TRIMETHO/400-80MG/TABS: SULFAMETHOXAZOLE/TRIMETHO/400-80MG/TABS | 28 days supply | Qty: 12 | Fill #1

## 2017-11-11 MED FILL — PREDNISONE/5MG/TABS: PREDNISONE/5MG/TABS | 30 days supply | Qty: 30 | Fill #1

## 2017-11-12 MED ORDER — ASPIRIN 81 MG TABLET,DELAYED RELEASE
ORAL_TABLET | Freq: Every day | ORAL | 11 refills | 0 days | Status: CP
Start: 2017-11-12 — End: 2017-12-07

## 2017-11-12 MED FILL — ASPIRIN LOW DOSE/81MG EC/TBEC: ASPIRIN LOW DOSE/81MG EC/TBEC | 30 days supply | Qty: 30 | Fill #0

## 2017-11-12 NOTE — Unmapped (Signed)
Patient request for RX refill.

## 2017-11-12 NOTE — Unmapped (Signed)
Per PCP office called patient insurance nurse line [phone] and enquired if they are able to send a nurse out to teach patient how to administer the granix injections.  They are unable to.  Spoke with local home health agency and they stated to get the orders to go through patient needs to have the specifics faxed to them including plan of care, diagnosis, supporting documentation, and office contact information to Fax# [phone].  Sent fax to their office.

## 2017-11-12 NOTE — Unmapped (Signed)
Per test claim for Granix at the [facility], patient needs Medication Assistance Program for Prior Authorization.

## 2017-11-13 MED ORDER — EMPTY CONTAINER
2 refills | 0 days
Start: 2017-11-13 — End: 2018-11-13

## 2017-11-13 NOTE — Unmapped (Signed)
PA approved for Granix 300mg /0.75ml from 11/12/17 to 10/29/18 for $8.50

## 2017-11-13 NOTE — Unmapped (Signed)
[facility]   Patient Onboarding/Medication Counseling    Michelle Travis is a 62 y.o. female with hx liver txp who I am counseling today on initiation of therapy.    Medication: granix 300mg /0.33ml    Verified patient's date of birth / HIPAA.      Education Provided: ??    Dose/Administration discussed: inject the contents of 1 syringe under the skin as directed when weekly labs show anc of 0.9 or below. This medication should be taken  without regard to food.     Storage requirements: this medicine should be stored in the refrigerator. leave out for 30 min before use. Good for 5 days out of fridge.    Side effects discussed: Discussed common side effects, including but not limited to inj site reaction, upset stomach, diarrhea, hair loss, etc. If patient experiences allergic reaction, kidney issues, lung/breathing issues, sweating, bleed/bruise, skin change, swelling etc, they need to call the doctor.  Patient will receive a Lexi-Comp drug information handout with shipment.    Handling precautions reviewed:  Patient will dispose of needles in a sharps container or empty laundry detergent bottle.    Drug Interactions: other medications reviewed and up to date in Epic.  No drug interactions identified.    Comorbidities/Allergies: reviewed and up to date in Epic.    Verified therapy is appropriate and should continue      Delivery Information    Anticipated copay of $8.50 reviewed with patient. Verified delivery address in FSI and reviewed medication storage requirement. Also sending sharps container today.    Scheduled delivery date: 11/15/17    Explained that we ship using UPS and this shipment will not require a signature.      Explained the services we provide at [facility] and that each month we would call to set up refills.  Stressed importance of returning phone calls so that we could ensure they receive their medications in time each month.  Informed patient that we should be setting up refills 7-10 days prior to when they will run out of medication.  Informed patient that welcome packet will be sent.      Patient verbalized understanding of the above information as well as how to contact the pharmacy at [phone] option 4 with any questions/concerns.        Patient Specific Needs      ? Patient has no physical or cognitive barriers.    ? Patient prefers to have medications discussed with  Patient or daughter     ? Patient is able to read and understand education materials at a high school level or above.        Michelle Travis  [facility] Specialty Pharmacist

## 2017-11-14 DIAGNOSIS — K7031 Alcoholic cirrhosis of liver with ascites: Secondary | ICD-10-CM | POA: Diagnosis not present

## 2017-11-14 DIAGNOSIS — D684 Acquired coagulation factor deficiency: Secondary | ICD-10-CM | POA: Diagnosis not present

## 2017-11-14 DIAGNOSIS — J029 Acute pharyngitis, unspecified: Secondary | ICD-10-CM | POA: Diagnosis not present

## 2017-11-14 DIAGNOSIS — Z944 Liver transplant status: Secondary | ICD-10-CM | POA: Diagnosis not present

## 2017-11-14 DIAGNOSIS — N183 Chronic kidney disease, stage 3 (moderate): Secondary | ICD-10-CM | POA: Diagnosis not present

## 2017-11-14 DIAGNOSIS — Z940 Kidney transplant status: Secondary | ICD-10-CM | POA: Diagnosis not present

## 2017-11-14 MED FILL — SHARPS KIT/NA/MISC: SHARPS KIT/NA/MISC | 120 days supply | Qty: 1 | Fill #0

## 2017-11-14 MED FILL — GRANIX/300MG/0.5ML/SOSY: GRANIX/300MG/0.5ML/SOSY | 21 days supply | Qty: 3 | Fill #0

## 2017-11-14 NOTE — Unmapped (Signed)
Spoke with Dr. Matilde Haymaker and he stated unless patients platelet count gets to the 800s no need to send a hematology referral at this time.

## 2017-11-15 LAB — CBC W/ DIFFERENTIAL
BASOPHILS ABSOLUTE COUNT: 0.4 10*3/uL — ABNORMAL HIGH (ref 0.0–0.2)
BASOPHILS RELATIVE PERCENT: 17 %
EOSINOPHILS ABSOLUTE COUNT: 0.4 10*3/uL (ref 0.0–0.4)
EOSINOPHILS RELATIVE PERCENT: 15 %
HEMATOCRIT: 34.6 % (ref 34.0–46.6)
HEMOGLOBIN: 10.1 g/dL — ABNORMAL LOW (ref 11.1–15.9)
LYMPHOCYTES ABSOLUTE COUNT: 0.8 10*3/uL (ref 0.7–3.1)
LYMPHOCYTES RELATIVE PERCENT: 30 %
MEAN CORPUSCULAR HEMOGLOBIN CONC: 29.2 g/dL — ABNORMAL LOW (ref 31.5–35.7)
MEAN CORPUSCULAR HEMOGLOBIN: 24.6 pg — ABNORMAL LOW (ref 26.6–33.0)
MONOCYTES ABSOLUTE COUNT: 0.4 10*3/uL (ref 0.1–0.9)
MONOCYTES RELATIVE PERCENT: 14 %
NEUTROPHILS ABSOLUTE COUNT: 0.6 10*3/uL — ABNORMAL LOW (ref 1.4–7.0)
NEUTROPHILS RELATIVE PERCENT: 24 %
PLATELET COUNT: 495 10*3/uL — ABNORMAL HIGH (ref 150–379)
RED BLOOD CELL COUNT: 4.1 x10E6/uL (ref 3.77–5.28)
RED CELL DISTRIBUTION WIDTH: 15.8 % — ABNORMAL HIGH (ref 12.3–15.4)

## 2017-11-15 LAB — COMPREHENSIVE METABOLIC PANEL
A/G RATIO: 2.2 (ref 1.2–2.2)
ALBUMIN: 4.6 g/dL (ref 3.6–4.8)
ALKALINE PHOSPHATASE: 109 IU/L (ref 39–117)
ALT (SGPT): 15 IU/L (ref 0–32)
AST (SGOT): 16 IU/L (ref 0–40)
BILIRUBIN TOTAL: <0.2 mg/dL (ref 0.0–1.2)
BLOOD UREA NITROGEN: 10 mg/dL (ref 8–27)
BUN / CREAT RATIO: 15 (ref 12–28)
CALCIUM: 9.9 mg/dL (ref 8.7–10.3)
CHLORIDE: 104 mmol/L (ref 96–106)
CREATININE: 0.68 mg/dL (ref 0.57–1.00)
GFR MDRD AF AMER: 108 mL/min/{1.73_m2}
GFR MDRD NON AF AMER: 94 mL/min/{1.73_m2}
GLUCOSE: 88 mg/dL (ref 65–99)
POTASSIUM: 4.7 mmol/L (ref 3.5–5.2)
SODIUM: 144 mmol/L (ref 134–144)
TOTAL PROTEIN: 6.7 g/dL (ref 6.0–8.5)

## 2017-11-15 LAB — MEAN CORPUSCULAR VOLUME: Lab: 84

## 2017-11-15 LAB — PHOSPHORUS, SERUM: Lab: 2.4 — ABNORMAL LOW

## 2017-11-15 LAB — SODIUM: Lab: 144

## 2017-11-15 LAB — GAMMA GLUTAMYL TRANSFERASE: Lab: 24

## 2017-11-15 LAB — BILIRUBIN DIRECT: Lab: 0.07

## 2017-11-15 LAB — MAGNESIUM: Lab: 1.5 — ABNORMAL LOW

## 2017-11-15 NOTE — Unmapped (Signed)
Daughter confirmed they feel comfortable giving injection to patient.  Sent message to TPA to overnight material.  Will face time patient granddaughter on her apple phone tomorrow after injection instructions are read to insure that the injection is given correctly.

## 2017-11-15 NOTE — Unmapped (Signed)
As per coordinator request document fedex overnight priority to patient. Fedex express confirmation no. [id number]

## 2017-11-16 LAB — TACROLIMUS BLOOD: Lab: 6.4

## 2017-11-16 MED ORDER — PROGRAF 1 MG CAPSULE
ORAL_CAPSULE | 11 refills | 0 days
Start: 2017-11-16 — End: 2017-11-16

## 2017-11-16 MED ORDER — TACROLIMUS 1 MG CAPSULE
ORAL_CAPSULE | Freq: Two times a day (BID) | ORAL | 11 refills | 0 days | Status: CP
Start: 2017-11-16 — End: 2017-12-07

## 2017-11-16 NOTE — Unmapped (Signed)
Left VM asking if patient has received paper instructions on how to given granix injection.

## 2017-11-17 NOTE — Unmapped (Signed)
Received on call page from pt. Called back and she stated she was having back spasms. She had already trying taking Tylenol and the pain is still unbearable. Advised pt to try OTC lidocaine patch and if that does not help to go to the ED. Pt verbalized understanding.

## 2017-11-17 NOTE — Unmapped (Signed)
Confirmed with daughter patient is to take Granix injection today and actually has done injections in the past.  Confirmed receipt of instruction manual for injection.  Per PharmD Nedra Hai Tacrolimus increased to 6 mg BID daughter verbalized understanding.

## 2017-11-21 DIAGNOSIS — Z944 Liver transplant status: Secondary | ICD-10-CM | POA: Diagnosis not present

## 2017-11-21 DIAGNOSIS — Z940 Kidney transplant status: Secondary | ICD-10-CM | POA: Diagnosis not present

## 2017-11-22 LAB — COMPREHENSIVE METABOLIC PANEL
ALBUMIN: 4.3 g/dL (ref 3.6–4.8)
ALKALINE PHOSPHATASE: 104 IU/L (ref 39–117)
ALT (SGPT): 14 IU/L (ref 0–32)
AST (SGOT): 16 IU/L (ref 0–40)
BILIRUBIN TOTAL: <0.2 mg/dL (ref 0.0–1.2)
BLOOD UREA NITROGEN: 14 mg/dL (ref 8–27)
BUN / CREAT RATIO: 20 (ref 12–28)
CALCIUM: 9.2 mg/dL (ref 8.7–10.3)
CHLORIDE: 104 mmol/L (ref 96–106)
CO2: 18 mmol/L — ABNORMAL LOW (ref 20–29)
CREATININE: 0.7 mg/dL (ref 0.57–1.00)
GFR MDRD AF AMER: 107 mL/min/{1.73_m2}
GFR MDRD NON AF AMER: 93 mL/min/{1.73_m2}
GLOBULIN, TOTAL: 2 g/dL (ref 1.5–4.5)
GLUCOSE: 103 mg/dL — ABNORMAL HIGH (ref 65–99)
POTASSIUM: 5.2 mmol/L (ref 3.5–5.2)
SODIUM: 141 mmol/L (ref 134–144)
TOTAL PROTEIN: 6.3 g/dL (ref 6.0–8.5)

## 2017-11-22 LAB — CBC W/ DIFFERENTIAL
BASOPHILS ABSOLUTE COUNT: 0.1 10*3/uL (ref 0.0–0.2)
BASOPHILS RELATIVE PERCENT: 5 %
EOSINOPHILS ABSOLUTE COUNT: 0.6 10*3/uL — ABNORMAL HIGH (ref 0.0–0.4)
EOSINOPHILS RELATIVE PERCENT: 21 %
HEMATOCRIT: 34.4 % (ref 34.0–46.6)
HEMOGLOBIN: 10.3 g/dL — ABNORMAL LOW (ref 11.1–15.9)
IMMATURE GRANULOCYTES: 4 %
LYMPHOCYTES ABSOLUTE COUNT: 1 10*3/uL (ref 0.7–3.1)
LYMPHOCYTES RELATIVE PERCENT: 37 %
MEAN CORPUSCULAR HEMOGLOBIN CONC: 29.9 g/dL — ABNORMAL LOW (ref 31.5–35.7)
MEAN CORPUSCULAR HEMOGLOBIN: 24.7 pg — ABNORMAL LOW (ref 26.6–33.0)
MEAN CORPUSCULAR VOLUME: 83 fL (ref 79–97)
MONOCYTES ABSOLUTE COUNT: 0.5 10*3/uL (ref 0.1–0.9)
MONOCYTES RELATIVE PERCENT: 17 %
NEUTROPHILS ABSOLUTE COUNT: 0.4 10*3/uL — CL (ref 1.4–7.0)
NEUTROPHILS RELATIVE PERCENT: 16 %
RED BLOOD CELL COUNT: 4.17 x10E6/uL (ref 3.77–5.28)
RED CELL DISTRIBUTION WIDTH: 15.8 % — ABNORMAL HIGH (ref 12.3–15.4)
WHITE BLOOD CELL COUNT: 2.6 10*3/uL — ABNORMAL LOW (ref 3.4–10.8)

## 2017-11-22 LAB — GAMMA GLUTAMYL TRANSFERASE: Lab: 27

## 2017-11-22 LAB — RED BLOOD CELL COUNT: Lab: 4.17

## 2017-11-22 LAB — BILIRUBIN TOTAL: Lab: <0.2

## 2017-11-22 LAB — PHOSPHORUS, SERUM: Lab: 2.7

## 2017-11-22 LAB — BILIRUBIN DIRECT: Lab: 0.05

## 2017-11-22 LAB — MAGNESIUM: Lab: 1.6

## 2017-11-22 MED ORDER — PREDNISONE 5 MG TABLET
ORAL_TABLET | Freq: Every day | ORAL | 11 refills | 0.00000 days | Status: CP
Start: 2017-11-22 — End: 2017-12-07

## 2017-11-22 NOTE — Unmapped (Signed)
Spoke with Marcelino Duster at [facility] about critical lab alert ANC of 0.4.  Patient had back spasms and bone pain.  Saturday took Tylenol but did not help finally wore off Sunday night.  Patient denies fever.  Patient with white patches in the back of throat.  Started antibiotic last Wednesday 11/14/17.  But now everything is resolved.  Patient appetite coming back. Patient does not know what she was prescribed.  Educated extensively about need for verifying medications with me every time.  Also educated about infection prevention practices and avoiding crowds right now because she has no immune system with her ANC being below detection.  She verbalized understanding.  Confirmed with local [facility] she was on Levofloxacin.     Spoke with Dr. Celine Mans and PharmD Nedra Hai and both confirmed CMV PCR needs to be checked, increase to prednisone 10 mg daily, HOLD Myfortic.  Patient agreed with plan and confirmed she would try a low potassium diet.  Sent  Potassium containing food list via mail to patient. Placed order for [facility] and enclosed order to patient in mail.  Both patient and daughter verbalized understanding.

## 2017-11-23 LAB — TACROLIMUS BLOOD: Lab: 9.8

## 2017-11-23 LAB — TACROLIMUS LEVEL: TACROLIMUS BLOOD: 9.8 ng/mL (ref 2.0–20.0)

## 2017-11-23 NOTE — Unmapped (Signed)
[facility] received fax from case manager, post liver/kidney services have been approved with 12 visits valid 11/23/17 to 09/25/18.

## 2017-11-28 DIAGNOSIS — D702 Other drug-induced agranulocytosis: Secondary | ICD-10-CM | POA: Diagnosis not present

## 2017-11-28 DIAGNOSIS — Z944 Liver transplant status: Secondary | ICD-10-CM | POA: Diagnosis not present

## 2017-11-28 DIAGNOSIS — Z940 Kidney transplant status: Secondary | ICD-10-CM | POA: Diagnosis not present

## 2017-11-28 DIAGNOSIS — Z79899 Other long term (current) drug therapy: Secondary | ICD-10-CM | POA: Diagnosis not present

## 2017-11-28 DIAGNOSIS — K769 Liver disease, unspecified: Secondary | ICD-10-CM | POA: Diagnosis not present

## 2017-11-29 LAB — BILIRUBIN DIRECT: Lab: 0.07

## 2017-11-29 LAB — CBC W/ DIFFERENTIAL
BASOPHILS ABSOLUTE COUNT: 0.2 10*3/uL (ref 0.0–0.2)
BASOPHILS RELATIVE PERCENT: 4 %
EOSINOPHILS ABSOLUTE COUNT: 0.4 10*3/uL (ref 0.0–0.4)
HEMATOCRIT: 36.9 % (ref 34.0–46.6)
HEMOGLOBIN: 11 g/dL — ABNORMAL LOW (ref 11.1–15.9)
IMMATURE GRANULOCYTES: 4 %
LYMPHOCYTES ABSOLUTE COUNT: 1 10*3/uL (ref 0.7–3.1)
LYMPHOCYTES RELATIVE PERCENT: 22 %
MEAN CORPUSCULAR HEMOGLOBIN CONC: 29.8 g/dL — ABNORMAL LOW (ref 31.5–35.7)
MEAN CORPUSCULAR HEMOGLOBIN: 24.4 pg — ABNORMAL LOW (ref 26.6–33.0)
MEAN CORPUSCULAR VOLUME: 82 fL (ref 79–97)
MONOCYTES ABSOLUTE COUNT: 0.7 10*3/uL (ref 0.1–0.9)
MONOCYTES RELATIVE PERCENT: 15 %
NEUTROPHILS ABSOLUTE COUNT: 2.1 10*3/uL (ref 1.4–7.0)
NEUTROPHILS RELATIVE PERCENT: 46 %
PLATELET COUNT: 442 10*3/uL — ABNORMAL HIGH (ref 150–379)
RED BLOOD CELL COUNT: 4.51 x10E6/uL (ref 3.77–5.28)
RED CELL DISTRIBUTION WIDTH: 16.5 % — ABNORMAL HIGH (ref 12.3–15.4)
WHITE BLOOD CELL COUNT: 4.5 10*3/uL (ref 3.4–10.8)

## 2017-11-29 LAB — COMPREHENSIVE METABOLIC PANEL
A/G RATIO: 2.6 — ABNORMAL HIGH (ref 1.2–2.2)
ALKALINE PHOSPHATASE: 95 IU/L (ref 39–117)
ALT (SGPT): 15 IU/L (ref 0–32)
AST (SGOT): 14 IU/L (ref 0–40)
BLOOD UREA NITROGEN: 16 mg/dL (ref 8–27)
BUN / CREAT RATIO: 21 (ref 12–28)
CALCIUM: 9.7 mg/dL (ref 8.7–10.3)
CHLORIDE: 103 mmol/L (ref 96–106)
CO2: 24 mmol/L (ref 20–29)
CREATININE: 0.76 mg/dL (ref 0.57–1.00)
GLOBULIN, TOTAL: 1.8 g/dL (ref 1.5–4.5)
GLUCOSE: 95 mg/dL (ref 65–99)
POTASSIUM: 5.5 mmol/L — ABNORMAL HIGH (ref 3.5–5.2)
TOTAL PROTEIN: 6.4 g/dL (ref 6.0–8.5)

## 2017-11-29 LAB — GLUCOSE: Lab: 95

## 2017-11-29 LAB — GAMMA GLUTAMYL TRANSFERASE: Lab: 37

## 2017-11-29 LAB — PHOSPHORUS, SERUM: Lab: 2.8

## 2017-11-29 LAB — MAGNESIUM: Lab: 1.6

## 2017-11-29 LAB — MEAN CORPUSCULAR HEMOGLOBIN: Lab: 24.4 — ABNORMAL LOW

## 2017-11-29 NOTE — Unmapped (Signed)
[facility] Refill and Clinical Coordination Note  Medication(s): MYFORTIC PROGRAF    Michelle Travis, DOB: 10-24-1956  Phone: [phone] (home) [phone] (work), Alternate phone contact: N/A  Shipping address: [address]  [name] [address]  Phone or address changes today?: No  All above HIPAA information verified.  Insurance changes? No    Completed refill and clinical call assessment today to schedule patient's medication shipment from the [facility] [phone]).      MEDICATION RECONCILIATION    Confirmed the medication and dosage are correct and have not changed: No, patient reports changes to the regimen as follows: HOLDING MYFORTIC NOW, PROGRAF CHANGED TO 6BID    Were there any changes to your medication(s) in the past month:  No, there are no changes reported at this time.    ADHERENCE    Is this medicine transplant or covered by Medicare Part B? No.    Prograf 1 mg   Quantity filled last month: 420   # of tablets left on hand: ATLEAST 1 MONTH LEFT - NOT FILLING TODAY - PT WILL HAVE DAUGHTER CHECK      Myfortic 180 mg   Quantity filled last month: 120   # of tablets left on hand: MED HAS BEEN HELD - NOT FILLING TODAY      Did you miss any doses in the past 4 weeks? No missed doses reported.  Adherence counseling provided? Not needed     SIDE EFFECT MANAGEMENT    Are you tolerating your medication?:  Michelle Travis reports tolerating the medication.  Side effect management discussed: None      Therapy is appropriate and should be continued.    Evidence of clinical benefit: See Epic note from 10/17/17      FINANCIAL/SHIPPING    Delivery Scheduled: Patient declined refill at this time due to NOT NEEDING ANY MEDS.   Additional medications refilled: No additional medications/refills needed at this time.    Michelle Travis did not have any additional questions at this time.    Delivery address validated in [facility]SI scheduling system: Yes, address listed above is correct.      We will follow up with patient monthly for standard refill processing and delivery.      Thank you,  Michelle Travis   [facility] Shared [facility] Specialty Pharmacist

## 2017-11-30 LAB — TACROLIMUS BLOOD: Lab: 7.6

## 2017-11-30 NOTE — Unmapped (Signed)
Patient reporting she is feeling better now.  Let her know to work on her low potassium diet and she verbalized understanding.

## 2017-11-30 NOTE — Unmapped (Signed)
Spoke with NP Martin-Velez about tac level and will see what the routine repeat labs next week look like.

## 2017-12-02 LAB — CMV DNA, QUANTITATIVE, PCR: CMV QUANT: NEGATIVE [IU]/mL

## 2017-12-02 LAB — LOG10 CMV QN DNA PL

## 2017-12-05 DIAGNOSIS — Z944 Liver transplant status: Secondary | ICD-10-CM | POA: Diagnosis not present

## 2017-12-05 DIAGNOSIS — Z940 Kidney transplant status: Secondary | ICD-10-CM | POA: Diagnosis not present

## 2017-12-05 MED FILL — PREDNISONE/5MG/TABS: PREDNISONE/5MG/TABS | 30 days supply | Qty: 30 | Fill #2

## 2017-12-05 MED FILL — SULFAMETHOXAZOLE/TRIMETHO/400-80MG/TABS: SULFAMETHOXAZOLE/TRIMETHO/400-80MG/TABS | 28 days supply | Qty: 12 | Fill #2

## 2017-12-05 MED FILL — ASPIRIN LOW DOSE/81MG EC/TBEC: ASPIRIN LOW DOSE/81MG EC/TBEC | 30 days supply | Qty: 30 | Fill #1

## 2017-12-05 NOTE — Unmapped (Signed)
Spoke with patient about healthy bowel regimen high in fiber and drinking 80-100 ounces of water/day.  Also provided her with a list of safe laxative medication.

## 2017-12-06 DIAGNOSIS — J449 Chronic obstructive pulmonary disease, unspecified: Secondary | ICD-10-CM | POA: Diagnosis not present

## 2017-12-06 DIAGNOSIS — Z4822 Encounter for aftercare following kidney transplant: Secondary | ICD-10-CM | POA: Diagnosis not present

## 2017-12-06 DIAGNOSIS — I10 Essential (primary) hypertension: Secondary | ICD-10-CM | POA: Diagnosis not present

## 2017-12-06 DIAGNOSIS — Z944 Liver transplant status: Secondary | ICD-10-CM | POA: Diagnosis not present

## 2017-12-06 DIAGNOSIS — R0602 Shortness of breath: Secondary | ICD-10-CM | POA: Diagnosis not present

## 2017-12-06 DIAGNOSIS — Z4823 Encounter for aftercare following liver transplant: Secondary | ICD-10-CM | POA: Diagnosis not present

## 2017-12-06 LAB — COMPREHENSIVE METABOLIC PANEL
A/G RATIO: 2 (ref 1.2–2.2)
ALBUMIN: 4.1 g/dL (ref 3.6–4.8)
BILIRUBIN TOTAL: <0.2 mg/dL (ref 0.0–1.2)
BLOOD UREA NITROGEN: 13 mg/dL (ref 8–27)
BUN / CREAT RATIO: 19 (ref 12–28)
CALCIUM: 8.8 mg/dL (ref 8.7–10.3)
CHLORIDE: 104 mmol/L (ref 96–106)
CO2: 25 mmol/L (ref 20–29)
CREATININE: 0.69 mg/dL (ref 0.57–1.00)
GFR MDRD AF AMER: 108 mL/min/{1.73_m2}
GFR MDRD NON AF AMER: 94 mL/min/{1.73_m2}
GLOBULIN, TOTAL: 2.1 g/dL (ref 1.5–4.5)
GLUCOSE: 98 mg/dL (ref 65–99)
POTASSIUM: 4.8 mmol/L (ref 3.5–5.2)
SODIUM: 140 mmol/L (ref 134–144)
TOTAL PROTEIN: 6.2 g/dL (ref 6.0–8.5)

## 2017-12-06 LAB — NEUTROPHILS RELATIVE PERCENT: Lab: 55

## 2017-12-06 LAB — CBC W/ DIFFERENTIAL
BANDED NEUTROPHILS ABSOLUTE COUNT: 0 10*3/uL (ref 0.0–0.1)
BASOPHILS ABSOLUTE COUNT: 0.1 10*3/uL (ref 0.0–0.2)
BASOPHILS RELATIVE PERCENT: 4 %
EOSINOPHILS ABSOLUTE COUNT: 0.3 10*3/uL (ref 0.0–0.4)
EOSINOPHILS RELATIVE PERCENT: 9 %
HEMATOCRIT: 34.9 % (ref 34.0–46.6)
IMMATURE GRANULOCYTES: 1 %
LYMPHOCYTES RELATIVE PERCENT: 22 %
MEAN CORPUSCULAR HEMOGLOBIN CONC: 29.8 g/dL — ABNORMAL LOW (ref 31.5–35.7)
MEAN CORPUSCULAR HEMOGLOBIN: 24.5 pg — ABNORMAL LOW (ref 26.6–33.0)
MEAN CORPUSCULAR VOLUME: 82 fL (ref 79–97)
MONOCYTES ABSOLUTE COUNT: 0.3 10*3/uL (ref 0.1–0.9)
MONOCYTES RELATIVE PERCENT: 9 %
PLATELET COUNT: 440 10*3/uL — ABNORMAL HIGH (ref 150–379)
RED BLOOD CELL COUNT: 4.24 x10E6/uL (ref 3.77–5.28)
RED CELL DISTRIBUTION WIDTH: 16.2 % — ABNORMAL HIGH (ref 12.3–15.4)
WHITE BLOOD CELL COUNT: 3.8 10*3/uL (ref 3.4–10.8)

## 2017-12-06 LAB — GAMMA GT: GAMMA GLUTAMYL TRANSFERASE: 38 IU/L (ref 0–60)

## 2017-12-06 LAB — SODIUM: Lab: 140

## 2017-12-06 LAB — GAMMA GLUTAMYL TRANSFERASE: Lab: 38

## 2017-12-06 LAB — BILIRUBIN DIRECT: Lab: 0.06

## 2017-12-06 LAB — PHOSPHORUS, SERUM: Lab: 2.4 — ABNORMAL LOW

## 2017-12-06 LAB — MAGNESIUM: Lab: 1.6

## 2017-12-07 LAB — TACROLIMUS BLOOD: Lab: 6.6

## 2017-12-07 MED ORDER — TACROLIMUS 1 MG CAPSULE: capsule | 11 refills | 0 days

## 2017-12-07 MED ORDER — SULFAMETHOXAZOLE 400 MG-TRIMETHOPRIM 80 MG TABLET
ORAL_TABLET | ORAL | 3 refills | 0 days | Status: CP
Start: 2017-12-07 — End: 2018-02-11

## 2017-12-07 MED ORDER — TACROLIMUS 1 MG CAPSULE
ORAL_CAPSULE | ORAL | 11 refills | 0.00000 days | Status: CP
Start: 2017-12-07 — End: 2017-12-07

## 2017-12-07 MED ORDER — PREDNISONE 5 MG TABLET
ORAL_TABLET | Freq: Every day | ORAL | 11 refills | 0 days | Status: CP
Start: 2017-12-07 — End: 2017-12-10

## 2017-12-07 MED ORDER — ASPIRIN 81 MG TABLET,DELAYED RELEASE
ORAL_TABLET | Freq: Every day | ORAL | 11 refills | 0 days | Status: CP
Start: 2017-12-07 — End: 2018-12-07

## 2017-12-07 NOTE — Unmapped (Signed)
Per NP Martin-Velez patient to increase to Tacrolimus 7 mg in am and 6 mg in pm.  Daughter stated she is out of medications for ASA, prednisone and bactrim.  Did 3 way call with pharmacy and confirmed medication should be arriving today.  Just in case sent script to local [facility] pharmacy incase there are issues.  Daughter agreed to call me back to confirm who is the manufacturer of patient tacrolimus.

## 2017-12-10 MED ORDER — PREDNISONE 5 MG TABLET: 10 mg | tablet | 11 refills | 0 days

## 2017-12-10 MED ORDER — PREDNISONE 5 MG TABLET
ORAL_TABLET | Freq: Every day | ORAL | 11 refills | 0.00000 days | Status: CP
Start: 2017-12-10 — End: 2017-12-10

## 2017-12-10 MED ORDER — TACROLIMUS 1 MG CAPSULE
ORAL | 11 refills | 0.00000 days | Status: CP
Start: 2017-12-10 — End: 2017-12-10

## 2017-12-10 MED ORDER — TACROLIMUS 1 MG CAPSULE: each | 11 refills | 0 days

## 2017-12-10 NOTE — Unmapped (Signed)
[facility] Refill Coordination Note  Specialty Medication(s): Prograf 1mg   Additional Medications shipped: Prednisone 5mg     STEVI HOLLINSHEAD, DOB: 03/11/1956  Phone: [phone] (home) [phone] (work), Alternate phone contact: N/A  Phone or address changes today?: No  All above HIPAA information was verified with patient.  Shipping Address: [address]  [place] [address]   Insurance changes? No    Completed refill call assessment today to schedule patient's medication shipment from the [facility] [phone]).      Confirmed the medication and dosage are correct and have not changed: Yes, regimen is correct and unchanged.    Confirmed patient started or stopped the following medications in the past month:  No, there are no changes reported at this time.    Are you tolerating your medication?:  Celisa reports tolerating the medication.    ADHERENCE    (Below is required for Medicare Part B or Transplant patients only - per drug):   How many tablets were dispensed last month:     Prograf 1 mg   Quantity filled last month: 420   # of tablets left on hand: 10 days    Did you miss any doses in the past 4 weeks? No missed doses reported.    FINANCIAL/SHIPPING    Delivery Scheduled: Yes, Expected medication delivery date: 12/17/2017     Nikka did not have any additional questions at this time.    Delivery address validated in [facility]SI scheduling system: Yes, address listed in FSI is correct.    We will follow up with patient monthly for standard refill processing and delivery.      Thank you,  Tamala Fothergill   [facility] Shared [facility] [place] [facility] Specialty Technician

## 2017-12-11 MED ORDER — TACROLIMUS 1 MG CAPSULE
ORAL | 11 refills | 0.00000 days | Status: CP
Start: 2017-12-11 — End: 2017-12-11

## 2017-12-11 MED ORDER — TACROLIMUS 1 MG CAPSULE: capsule | 11 refills | 0 days | Status: AC

## 2017-12-12 DIAGNOSIS — Z940 Kidney transplant status: Secondary | ICD-10-CM | POA: Diagnosis not present

## 2017-12-12 DIAGNOSIS — Z944 Liver transplant status: Secondary | ICD-10-CM | POA: Diagnosis not present

## 2017-12-13 LAB — CBC W/ DIFFERENTIAL
BANDED NEUTROPHILS ABSOLUTE COUNT: 0 10*3/uL (ref 0.0–0.1)
BASOPHILS ABSOLUTE COUNT: 0.1 10*3/uL (ref 0.0–0.2)
EOSINOPHILS ABSOLUTE COUNT: 0.2 10*3/uL (ref 0.0–0.4)
EOSINOPHILS RELATIVE PERCENT: 7 %
HEMATOCRIT: 34.6 % (ref 34.0–46.6)
HEMOGLOBIN: 10.1 g/dL — ABNORMAL LOW (ref 11.1–15.9)
IMMATURE GRANULOCYTES: 1 %
LYMPHOCYTES ABSOLUTE COUNT: 1.2 10*3/uL (ref 0.7–3.1)
LYMPHOCYTES RELATIVE PERCENT: 35 %
MEAN CORPUSCULAR HEMOGLOBIN CONC: 29.2 g/dL — ABNORMAL LOW (ref 31.5–35.7)
MEAN CORPUSCULAR HEMOGLOBIN: 24.5 pg — ABNORMAL LOW (ref 26.6–33.0)
MEAN CORPUSCULAR VOLUME: 84 fL (ref 79–97)
MONOCYTES RELATIVE PERCENT: 4 %
NEUTROPHILS ABSOLUTE COUNT: 1.7 10*3/uL (ref 1.4–7.0)
NEUTROPHILS RELATIVE PERCENT: 50 %
RED BLOOD CELL COUNT: 4.13 x10E6/uL (ref 3.77–5.28)
RED CELL DISTRIBUTION WIDTH: 16.1 % — ABNORMAL HIGH (ref 12.3–15.4)
WHITE BLOOD CELL COUNT: 3.4 10*3/uL (ref 3.4–10.8)

## 2017-12-13 LAB — MAGNESIUM: Lab: 1.5 — ABNORMAL LOW

## 2017-12-13 LAB — COMPREHENSIVE METABOLIC PANEL
A/G RATIO: 2.1 (ref 1.2–2.2)
ALBUMIN: 4 g/dL (ref 3.6–4.8)
ALKALINE PHOSPHATASE: 87 IU/L (ref 39–117)
ALT (SGPT): 15 IU/L (ref 0–32)
AST (SGOT): 10 IU/L (ref 0–40)
BILIRUBIN TOTAL: <0.2 mg/dL (ref 0.0–1.2)
BLOOD UREA NITROGEN: 19 mg/dL (ref 8–27)
CALCIUM: 9 mg/dL (ref 8.7–10.3)
CHLORIDE: 107 mmol/L — ABNORMAL HIGH (ref 96–106)
CO2: 22 mmol/L (ref 20–29)
CREATININE: 0.69 mg/dL (ref 0.57–1.00)
GFR MDRD AF AMER: 108 mL/min/{1.73_m2}
GFR MDRD NON AF AMER: 94 mL/min/{1.73_m2}
GLOBULIN, TOTAL: 1.9 g/dL (ref 1.5–4.5)
GLUCOSE: 94 mg/dL (ref 65–99)
POTASSIUM: 5.2 mmol/L (ref 3.5–5.2)
TOTAL PROTEIN: 5.9 g/dL — ABNORMAL LOW (ref 6.0–8.5)

## 2017-12-13 LAB — BILIRUBIN DIRECT: Lab: 0.04

## 2017-12-13 LAB — GAMMA GLUTAMYL TRANSFERASE: Lab: 37

## 2017-12-13 LAB — BLOOD UREA NITROGEN: Lab: 19

## 2017-12-13 LAB — LYMPHOCYTES RELATIVE PERCENT: Lab: 35

## 2017-12-13 LAB — PHOSPHORUS, SERUM: Lab: 2.4 — ABNORMAL LOW

## 2017-12-14 LAB — TACROLIMUS BLOOD: Lab: 9.4

## 2017-12-17 ENCOUNTER — Ambulatory Visit: Admit: 2017-12-17 | Discharge: 2017-12-18 | Payer: MEDICARE | Attending: Registered" | Primary: Registered"

## 2017-12-17 ENCOUNTER — Ambulatory Visit
Admit: 2017-12-17 | Discharge: 2017-12-18 | Payer: MEDICARE | Attending: Physician Assistant | Primary: Physician Assistant

## 2017-12-17 ENCOUNTER — Ambulatory Visit: Admit: 2017-12-17 | Discharge: 2017-12-17 | Payer: MEDICARE

## 2017-12-17 ENCOUNTER — Institutional Professional Consult (permissible substitution): Admit: 2017-12-17 | Discharge: 2017-12-18 | Payer: MEDICARE

## 2017-12-17 ENCOUNTER — Ambulatory Visit: Admit: 2017-12-17 | Discharge: 2017-12-18 | Payer: MEDICARE

## 2017-12-17 ENCOUNTER — Ambulatory Visit
Admit: 2017-12-17 | Discharge: 2017-12-18 | Payer: MEDICARE | Attending: Pharmacist Clinician (PhC)/ Clinical Pharmacy Specialist | Primary: Pharmacist Clinician (PhC)/ Clinical Pharmacy Specialist

## 2017-12-17 DIAGNOSIS — Z79899 Other long term (current) drug therapy: Secondary | ICD-10-CM | POA: Diagnosis not present

## 2017-12-17 DIAGNOSIS — Z940 Kidney transplant status: Secondary | ICD-10-CM | POA: Diagnosis not present

## 2017-12-17 DIAGNOSIS — R6881 Early satiety: Secondary | ICD-10-CM | POA: Diagnosis not present

## 2017-12-17 DIAGNOSIS — D472 Monoclonal gammopathy: Secondary | ICD-10-CM | POA: Diagnosis not present

## 2017-12-17 DIAGNOSIS — D709 Neutropenia, unspecified: Secondary | ICD-10-CM | POA: Diagnosis not present

## 2017-12-17 DIAGNOSIS — B258 Other cytomegaloviral diseases: Secondary | ICD-10-CM | POA: Diagnosis not present

## 2017-12-17 DIAGNOSIS — Z944 Liver transplant status: Secondary | ICD-10-CM | POA: Diagnosis not present

## 2017-12-17 DIAGNOSIS — Z7952 Long term (current) use of systemic steroids: Secondary | ICD-10-CM | POA: Diagnosis not present

## 2017-12-17 DIAGNOSIS — K5900 Constipation, unspecified: Secondary | ICD-10-CM | POA: Diagnosis not present

## 2017-12-17 DIAGNOSIS — K769 Liver disease, unspecified: Secondary | ICD-10-CM | POA: Diagnosis not present

## 2017-12-17 DIAGNOSIS — Z5181 Encounter for therapeutic drug level monitoring: Secondary | ICD-10-CM | POA: Diagnosis not present

## 2017-12-17 LAB — COMPREHENSIVE METABOLIC PANEL
ALBUMIN: 4.2 g/dL (ref 3.5–5.0)
ALKALINE PHOSPHATASE: 75 U/L (ref 38–126)
ALT (SGPT): 23 U/L (ref 15–48)
ANION GAP: 7 mmol/L — ABNORMAL LOW (ref 9–15)
AST (SGOT): 17 U/L (ref 14–38)
BILIRUBIN TOTAL: 0.3 mg/dL (ref 0.0–1.2)
BUN / CREAT RATIO: 29
CHLORIDE: 108 mmol/L — ABNORMAL HIGH (ref 98–107)
CO2: 26 mmol/L (ref 22.0–30.0)
CREATININE: 0.62 mg/dL (ref 0.60–1.00)
EGFR MDRD AF AMER: >=60 mL/min/{1.73_m2} (ref >=60)
EGFR MDRD NON AF AMER: >=60 mL/min/{1.73_m2} (ref >=60)
GLUCOSE RANDOM: 87 mg/dL (ref 65–99)
POTASSIUM: 4.8 mmol/L (ref 3.5–5.0)
PROTEIN TOTAL: 6.1 g/dL — ABNORMAL LOW (ref 6.5–8.3)
SODIUM: 141 mmol/L (ref 135–145)

## 2017-12-17 LAB — CBC W/ AUTO DIFF
BASOPHILS ABSOLUTE COUNT: 0.1 10*9/L (ref 0.0–0.1)
BASOPHILS RELATIVE PERCENT: 1.6 %
EOSINOPHILS ABSOLUTE COUNT: 0.4 10*9/L (ref 0.0–0.4)
EOSINOPHILS RELATIVE PERCENT: 11 %
HEMATOCRIT: 35.7 % — ABNORMAL LOW (ref 36.0–46.0)
HEMOGLOBIN: 10.2 g/dL — ABNORMAL LOW (ref 12.0–16.0)
LYMPHOCYTES ABSOLUTE COUNT: 1 10*9/L — ABNORMAL LOW (ref 1.5–5.0)
LYMPHOCYTES RELATIVE PERCENT: 28.9 %
MEAN CORPUSCULAR VOLUME: 84 fL (ref 80.0–100.0)
MEAN PLATELET VOLUME: 7.3 fL (ref 7.0–10.0)
MONOCYTES ABSOLUTE COUNT: 0.2 10*9/L (ref 0.2–0.8)
MONOCYTES RELATIVE PERCENT: 5.4 %
NEUTROPHILS ABSOLUTE COUNT: 1.7 10*9/L — ABNORMAL LOW (ref 2.0–7.5)
NEUTROPHILS RELATIVE PERCENT: 49.7 %
PLATELET COUNT: 425 10*9/L (ref 150–440)
RED BLOOD CELL COUNT: 4.25 10*12/L (ref 4.00–5.20)
RED CELL DISTRIBUTION WIDTH: 15.6 % — ABNORMAL HIGH (ref 12.0–15.0)
WBC ADJUSTED: 3.4 10*9/L — ABNORMAL LOW (ref 4.5–11.0)

## 2017-12-17 LAB — GAMMA GLUTAMYL TRANSFERASE: Gamma glutamyl transferase:CCnc:Pt:Ser/Plas:Qn:: 41

## 2017-12-17 LAB — SLIDE REVIEW

## 2017-12-17 LAB — MAGNESIUM: Magnesium:MCnc:Pt:Ser/Plas:Qn:: 1.7

## 2017-12-17 LAB — VITAMIN D, TOTAL (25OH): Lab: 12 — ABNORMAL LOW

## 2017-12-17 LAB — CMV DNA, QUANTITATIVE, PCR

## 2017-12-17 LAB — CMV COMMENT: Lab: 0

## 2017-12-17 LAB — BURR CELLS

## 2017-12-17 LAB — BILIRUBIN DIRECT: Bilirubin.glucuronidated:MCnc:Pt:Ser/Plas:Qn:: <0.1

## 2017-12-17 LAB — PHOSPHORUS: Phosphate:MCnc:Pt:Ser/Plas:Qn:: 3

## 2017-12-17 LAB — CHLORIDE: Chloride:SCnc:Pt:Ser/Plas:Qn:: 108 — ABNORMAL HIGH

## 2017-12-17 LAB — TACROLIMUS LEVEL, TROUGH: TACROLIMUS, TROUGH: 6.6 ng/mL (ref <=20.0)

## 2017-12-17 LAB — TACROLIMUS, TROUGH: Lab: 6.6

## 2017-12-17 LAB — PLATELET COUNT: Lab: 425

## 2017-12-17 MED ORDER — MYCOPHENOLATE SODIUM 180 MG TABLET,DELAYED RELEASE
ORAL_TABLET | Freq: Two times a day (BID) | ORAL | 11 refills | 0.00000 days | Status: CP
Start: 2017-12-17 — End: 2017-12-17

## 2017-12-17 MED ORDER — MYFORTIC 180 MG TABLET,DELAYED RELEASE
ORAL_TABLET | Freq: Two times a day (BID) | ORAL | 11 refills | 0.00000 days | Status: CP
Start: 2017-12-17 — End: 2017-12-17

## 2017-12-17 MED ORDER — MYFORTIC 180 MG TABLET,DELAYED RELEASE: tablet | 11 refills | 0 days

## 2017-12-17 NOTE — Unmapped (Signed)
TRANSPLANT SURGERY PROGRESS NOTES    Assessment/Plan:   Michelle Travis is a 62 y.o. 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    She is clinically doing very well. Labs reveal no significant abnormality; neutropenia has improved. Creatinine is WNL to 0.62 and LFTs are WNL. Prograf level was at goal last week; trough pending today.    - Restart Myfortic 180mg  BID  - Decrease Prednisone to 5mg  PO daily  - Continue Prograf- adjust according to trough with goal 6-8  - RTC at 6 month post-op  - Lab frequency: weekly      Subjective   Michelle Travis is a 62 y.o. 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 She has been off HD since discharge, and she was last seen 12/19. Since last seen, VIR has pulled her HD cath on 1/3. She reports feeling very well overall, active at home. She does report some early satiety and constipation, but she notes very strong appetite. She denies fever, chills, abdominal pain, nausea, vomiting, or diarrhea. She denies dysuria. She is voiding frequently and hydrating well.       ??  Objective     Vitals:    12/17/17 1032   BP: 137/81   Pulse: 59   Temp: 35.9 ??C (96.6 ??F)   TempSrc: Tympanic   Weight: 73.5 kg (162 lb)   Height: 160 cm (5' 2.99)      Body mass index is 28.7 kg/m??.     Physical Exam:    General Appearance:    No acute distress  Lungs:                 Clear to auscultation bilaterally, normal work of breathing  Heart:                            Regular rate and rhythm  Abdomen:                 Soft, non-tender to palpation, incision healing well without hernia  Extremities:               Warm and well perfused, no edema      Data Review:  All lab results last 24 hours reviewed.    Imaging:   KUB pending

## 2017-12-17 NOTE — Unmapped (Signed)
3 MONTH POST-TRANSPLANT LIVER TRANSPLANT   SOCIAL WORK FOLLOW UP ASSESSMENT       Patient Name: MARLETTA BOUSQUET  Medical Record Number: [id number]  Date of Service: December 17, 2017  PRESENT: patient and Helmut Muster Rogowski/daughter       BACKGROUND INFORMATION: Ms.  Detter is a 62 y.o. African-American, female post liver transplant. Patient received a liver transplant on 09/13/2017 (Liver), 09/13/2017 (Kidney).     BEHAVIORAL OBSERVATIONS:   She was interviewed with daughter present.     MENTAL STATUS EXAM:  Appearance: Appears stated age  Speech/Language: Normal rate, volume, tone, fluency  Mood: appropriate  Affect: Euthymic  Orientation: Oriented to person, place, time, and general circumstances  Concentration: Able to fully concentrate and attend  Memory: Immediate, short-term, long-term, and recall grossly intact  Insight: Intact  Judgment: Intact      LIVING SITUATION:  Housing: patient lives alone   Marital status:widowed since September 15, 2017; reports coping well  Children/dependents: one adult daughter, 3 grandchildren, and 2 great grandchildren    Adherence:  Medication Adherence: Good  Medication Concerns: denied problems taking medications, concerns about side effects, affordability, problems obtaining medications, and difficulty remembering medications  Other Adherence: Good     Side Effects: weight gain  Insurance Coverage: [employer] Medicare and Medicaid MQB    Lifestyle Issues:  Physical activity:  Fair  Nutrition/Appetite:  Excellent  Sleep: Fair  Pain (0=no pain; 10=worst pain imaginable): 0/10  Pain Medications:  none    Substance Issues:  Nicotine/Tobacco: denies  Current alcohol [id number]  Illicit drug use: Denied  -If yes type: N/A  licit substance abuse or misuse: Denied      Social Issues:  Academic librarian Issues: denies  Social Stressors/Changes: denies  Transportation issues: denies  Administrator, sports: denies  Source of Income: SSDI and widow's benefit $91    Adjustment Issues:  Feelings about transplant: recommended to others  PTSD: Denied    GAD-7 Score:  Clinic Collected GAD-7 Total Score: 2=none to minimal    PHQ-2 Score:  Clinic Collected PHQ-2 Total Score : 0=none      Coping Style: Functional    ASSESSMENT: Patient is 62 year old African-American, female post L/K transplant on 09/13/17. She is oriented x4 with a congruent mood and affect. She appears to be friendly and cooperative.    Patient denies depression and/or anxiety at this time. She did admit that Valentine's Day was difficult, but her daughter and grandchildren took her out for dinner to help her cope. Patient husband died in 2017-09-15. Assessments completed for depression and anxiety indicate no concerns at this time.                    RECOMMENDATIONS:   1. Annual follow up to provide continued support and encouragement.       Sibyl Parr, MSW, LCSW  Clinical Social Worker  [facility]   Completed: 12/17/17

## 2017-12-17 NOTE — Unmapped (Signed)
Outpatient Adult Nutrition - Post Liver/Kidney Transplant Follow-up     Reason for Visit: s/p liver/kidney transplant 09/13/17    PMH:   Patient Active Problem List   Diagnosis   ??? Hypertension (RAF-HCC)   ??? Chronic back pain   ??? History of breast cancer 2000 s/p lumpectomy   ??? Sleep apnea   ??? Membranous Nephropathy with segmental scarring   ??? Rectus sheath hematoma   ??? Liver replaced by transplant (CMS-HCC)   ??? Kidney transplanted   ??? Aftercare following organ transplant     Anthropometric Data:  -- Height: 160 cm (5' 2.99)  -- Last recorded weight: 73.7 kg (162 lb 6.4 oz)  -- IBW: 52.21 kg  -- Percent IBW: ~141%  -- AdjIBW: 57.5 kg  -- BMI: Body mass index is 28.78 kg/m??.     -- Weight history: pt has gained back to UBW of ~160 lbs  Wt Readings from Last 10 Encounters:   12/17/17 73.5 kg (162 lb)   12/17/17 73.7 kg (162 lb 7.7 oz)   12/17/17 73.7 kg (162 lb 6.4 oz)   11/01/17 66.6 kg (146 lb 13.2 oz)   10/17/17 66.7 kg (147 lb)   10/17/17 67 kg (147 lb 12.8 oz)   10/17/17 67 kg (147 lb 12.8 oz)   10/17/17 66.9 kg (147 lb 7.8 oz)   10/02/17 73.7 kg (162 lb 8 oz)   09/24/17 76.8 kg (169 lb 5 oz)     Nutrition Focused Physical Exam:                        Nutrition Evaluation  Overall Impressions: Nutrition-Focused Physical Exam not indicated due to lack of malnutrition risk factors. (12/17/17 1326)      Relevant Medications, Herbs, Supplements include:   Prograf (aware of grapefruit, pomegranate, star fruit and limit clementine x 1 daily), prednisone - increased to 2 doses daily, stopped myfortic    Relevant Labs: reviewed    Physical Activity: cleans the house, does the laundry, does errands with her daughter and grandchildren, walks at the mall etc. Pt and daughter have noticed improvements in strength and endurance    Dietary Restrictions: nuts and small seeds    Allergies:   Allergies   Allergen Reactions   ??? Latex Swelling and Rash     Discoloration of skin.   Discoloration of skin.    ??? Hydrocodone Itching ??? Hydrocodone-Acetaminophen Itching     Usual Intake:   1st - cooks herself - bacon, egg, toast or cereal or oatmeal  Snack - n/a; Ensure sometimes  2nd - out or at home - pizza or fish or a sandwich - still adhering to food safety precautions  Snack - potato chips and candy   3rd - pt cooks herself - fish or chicken, vegetables, macaroni salad, corn bread - meat, vegetable, starch   Snack - n/a  Beverages - water, cranberry juice    Hunger and Satiety: normal    Nutrition History: pt endorses significant improvement in appetite & intake since her prednisone dose was doubled. Has subsequently gained back to her baseline weight of ~160 lbs. Aiming now for weight maintenance. No n/v, constipation with medication. Previously on a bowel regimen although reports miralax is no longer effective. Typically has 1 BM a day.     Social: accompanied by daughter, who remains very supportive    Estimated Daily Nutritional Needs:   Estimated Energy Needs: 1265-1437 kcal/day (22-25 kcal/kg adjusted BW)  Estimated Protein Needs: 69-86 g pro/day (1.2-1.5 g/kg adjusted BW)  Estimated Fluid Needs: per MD    Nutrition Assessment: pt's PO intake has improved substantially since last RD visit. She has subsequently gained back to her baseline weight. Discussed increasing physical activity and decreasing intake of chips/candy to maintain weight. Awaiting ANC to discuss liberalization of food safety precautions.     Nutrition Diagnosis:  Malnutrition Assessment using AND/ASPEN Clinical Characteristics:  Patient does not meet AND/ASPEN criteria for malnutrition at this time (12/17/17 1326)                      Education: weight maintenance, liberalization of food safety precautions    Nutrition Goals:  Meet nutritional needs   Reduce long-term health risk  Maintain current wt  Start exercise regimen    Interventions:   1. Eat 3 meals daily with appropriate protein  2. Exercise when able - walk + low intensity resistance training  3. Decrease intake of salty chips and sugary candies   4. Continue following food safety guidelines    Materials Provided were:  Tips and suggestions - verbal  RD Contact Info    Follow-up: at 6 months visit    Length of visit was: 20 minutes    Jackqulyn Livings MPH, RD, LDN  Pager: [phone]

## 2017-12-17 NOTE — Unmapped (Signed)
[facility]ANS[facility] PHARMACY NOTE  12/17/2017   Michelle Travis  [id number]    Medication changes today:   1. Restart Myfortic 180 mg PO BID  2. Decrease prednisone to 5 mg PO daily  3. Start ergocalciferol 50,000 units PO once weekly x 8 weeks, then switch to cholecalciferol 2000 units PO daily (coordinator to call patient)    Education/Adherence tools provided today:  1.provided updated medication list    Follow up items:  1. Follow up to ensure patient started vitamin D suppelmentation    Next visit with pharmacy in 1-3 months  ____________________________________________________________________    Michelle Travis is a 62 y.o. 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     Post-op course complicated by DGF requiring HD from 09/14/17-09/24/17.    Other PMH significant for HTN, h/o breast cancer s/p lumpectomy (2000)    Seen by pharmacy today for: medication management ; last seen by pharmacy 4 weeks ago     CC:  Patient has no complaints today     Vitals:    12/17/17 1031   BP: 137/81   Pulse: 59   Temp: 35.9 ??C (96.6 ??F)       Allergies   Allergen Reactions   ??? Latex Swelling and Rash     Discoloration of skin.   Discoloration of skin.    ??? Hydrocodone Itching   ??? Hydrocodone-Acetaminophen Itching       All medications reviewed and updated.     Medication list includes revisions made during today???s encounter    Outpatient Encounter Prescriptions as of 12/17/2017   Medication Sig Dispense Refill   ??? predniSONE (DELTASONE) 5 MG tablet Take 5 mg by mouth daily.     ??? acetaminophen (TYLENOL) 325 MG tablet Take 1-2 tablets (325-650 mg total) by mouth every six (6) hours as needed for pain. 100 tablet 0   ??? aspirin (ECOTRIN) 81 MG tablet Take 1 tablet (81 mg total) by mouth daily. 30 tablet 11   ??? MYFORTIC 180 mg EC tablet Take 1 tablet (180 mg total) by mouth Two (2) times a day. 60 tablet 11   ??? pantoprazole (PROTONIX) 40 MG tablet Take 1 tablet (40 mg total) by mouth daily as needed. 30 tablet PRN   ??? sulfamethoxazole-trimethoprim (BACTRIM,SEPTRA) 400-80 mg per tablet Take 1 tablet (80 mg of trimethoprim total) by mouth 3 (three) times a week. 12 tablet 3   ??? tacrolimus (PROGRAF) 1 MG capsule 7 mg in am and 6 mg in pm 390 capsule 11   ??? tbo-filgrastim (GRANIX) 300 mcg/0.5 mL Syrg injection Take directed 3 Syringe 0   ??? [DISCONTINUED] mycophenolate (MYFORTIC) 180 MG EC tablet Take 1 tablet (180 mg total) by mouth Two (2) times a day. 60 tablet 11   ??? [DISCONTINUED] predniSONE (DELTASONE) 5 MG tablet Take 2 tablets (10 mg total) by mouth daily. 60 tablet 11   ??? [DISCONTINUED] valGANciclovir (VALCYTE) 450 mg tablet Take 1 tablet (450 mg total) by mouth daily. 128 tablet 0     No facility-administered encounter medications on file as of 12/17/2017.        Induction agent : basiliximab    CURRENT IMMUNOSUPPRESSION: tacrolimus 7mg /6mg  PO  Prograf goal: 6-8   myfortic on hold since 11/22/17 2/2 diarrhea and leukopenia (pt required Granix injection for low ANC with last injection about 2 weeks ago)    prednisone 10 mg PO daily (for additional  coverage while patient is off myfortic)    Patient complains of a slight tremor that comes and goes but has improved since stopping Myfortic    IMMUNOSUPPRESSION DRUG LEVELS:  Lab Results   Component Value Date    TACROLIMUS 6.6 12/17/2017    TACROLIMUS 9.4 12/12/2017    TACROLIMUS 6.6 12/05/2017    TACROLIMUS 7.6 11/28/2017    TACROLIMUS 4.7 10/17/2017    TACROLIMUS 8.8 09/25/2017     No results found for: CYCLO  No results found for: EVEROLIMUS  No results found for: SIROLIMUS    Prograf level drawn a little early so may be slightly higher than expected    Graft function: stable  DSA: ntd  Biopsies to date: 09/13/17 - liver zero-hour biopsy unremarkable  WBC/ANC:  3.4/1.7    Plan: Since leukopenia and diarrhea have resolved, restart Myfortic 180 mg PO BID and decrease prednisone to 5 mg PO daily. Continue tacrolimus 7 mg qAM/6 mg qPM. Continue to monitor.    ID prophylaxis:   CMV Status: D+/ R+, moderate risk . CMV prophylaxis: valganciclovir 450 mg daily x 3 months per protocol - completed  Lab Results   Component Value Date    CMVCP Negative 11/28/2017   Serum creatinine: 0.62 mg/dL 57/84/69 6295  Estimated creatinine clearance: 90.4 mL/min  PCP Prophylaxis: bactrim SS 1 tab MWF x 6 months.  Thrush:  completed in hospital  Patient is  tolerating infectious prophylaxis well    Plan: Continue per protocol. Continue to monitor.    CV Prophylaxis: asa 81 mg   The 10-year ASCVD risk score Denman George DC Jr., et al., 2013) is: 8.8%  Statin therapy: Indicated; currently on none  Plan: consider starting statin at a later date. Continue to monitor     BP: Goal < 140/90. Clinic vitals reported above  Home BP ranges: did not bring log  Current meds include: none  Plan: within goal Continue to monitor    Anemia:  H/H:   Lab Results   Component Value Date    HGB 10.2 (L) 12/17/2017     Lab Results   Component Value Date    HCT 35.7 (L) 12/17/2017     Iron panel:  Lab Results   Component Value Date    IRON 142 06/12/2017    TIBC 325.6 06/12/2017    FERRITIN 83.5 06/12/2017     Lab Results   Component Value Date    LABIRON 44 06/12/2017   Prior ESA use: none documented post-transplant in [facility] system  Plan: stable. Continue to monitor.     DM:   Lab Results   Component Value Date    A1C 4.4 (L) 09/12/2017   . Goal A1c < 7  History of Dm? No  Established with endocrinologist/PCP for BG managment? No  Plan:  Continue to monitor    Electrolytes: wnl  Meds currently on: none  Plan: Continue to monitor     GI/BM: Pt reports 1 BM per day, but usually has 2-3 per day. Reports feeling somewhat constipated and bloated for the past 2 weeks. She says she has been taking miralax PRN but hasn't found it helpful. She reports taking pantoprazole PRN.  Meds currently on: Miralax PRN, pantoprazole 40 mg PO daily PRN  Plan: Restarting myfortic may help with BM. No other interventions today. Continue to monitor    Pain: pt reports no pain  Meds currently on: APAP PRN  Plan: Continue to monitor    Bone health:  Vitamin D Level: last level is 12 (12/17/17). Goal > 30.   Last DEXA results:  06/14/16: The femoral neck density indicates osteopenia, but the spine and total femoral densities are normal.  Current meds include: none  Plan: Vitamin D level  out of goal,start ergocalciferol 50,000 units PO once weekly x 8 weeks, then switch to cholecalciferol 2,000 units PO once daily (coodinator to contact patient about this). Continue to monitor.     Women's/Men's Health:  Michelle Travis is a 62 y.o. Female perimenopausal. Patient reports no men's/women's health issues  Plan: Continue to monitor    Adherence: Patient has average understanding of medications; was not able to independently identify names/doses of immunosuppressants and OI meds.  Patient  does not fill their own pill box on a regular basis at home. Patient's daughter primarily manages her medications.  Patient brought medication card:yes  Pill box:did not bring  Plan: provided basic adherence counseling/intervention    Spent approximately 30 minutes on educating this patient and greater than 50% was spent in direct face to face counseling regarding post transplant medication education. Questions and concerns were address to patient's satisfaction.    Patient was reviewed with Dr. Rush Barer who was agreement with the stated plan:     During this visit, the following was completed:   BG log data assessment  BP log data assessment  Labs ordered and evaluated  complex treatment plan >1 DS   Patient education was completed for 6-10 minutes     All questions/concerns were addressed to the patient's satisfaction.    Arvilla Market, PharmD Candidate  __________________________________________  Noah Charon, PHARMD, CPP  SOLID ORGAN TRANSPLANT  PAGER [phone]

## 2017-12-17 NOTE — Unmapped (Signed)
Patient educated with 3 month education packet for approximately 30 minutes.  Next follow up will be on 03/18/18 with psychology, nutrition, Dr. Celine Mans, and pharmacy for her 6 month post transplant appointments.  Patient confirmed she is on generic tacrolimus and has Brand Myfortic pills to restart.  Sent script to [facility] for Brand Myfortic.

## 2017-12-17 NOTE — Unmapped (Deleted)
[facility] Department of Surgery,   [facility] [address]  [place], [place]  [address]  Ph: [phone]  Fax: [phone]  12/17/2017    Assessment/Plan:    [name] is a 62 y.o. female who underwent transplant on 09/13/2017 (Liver), 09/13/2017 (Kidney). The patient is in clinic and after obtaining the HPI and reviewing the chart we addressed the following medical issues:       CLINIC RECOMMENDATIONS :  1. Immunosuppressive management: ***  2. Lab frequency: ***  3. Follow-up: ***    ______________________________________________________________________          HPI: Michelle Travis is a 62 y.o. {Female Female Both:29994:o} s/p {liver/kidney} transplant who presents today for a follow up visit. With respect to {his/her:36107} functional status, {He/she:21736} denies significant SOB, chest pain, or other exertional symptoms.       Past Medical History    {GSC Past Medical History:30421616}      Past Surgical History    {GSC Past Surgical History:30421619}      MEDICATIONS:  Current Outpatient Prescriptions   Medication Sig Dispense Refill   ??? acetaminophen (TYLENOL) 325 MG tablet Take 1-2 tablets (325-650 mg total) by mouth every six (6) hours as needed for pain. 100 tablet 0   ??? aspirin (ECOTRIN) 81 MG tablet Take 1 tablet (81 mg total) by mouth daily. 30 tablet 11   ??? MYFORTIC 180 mg EC tablet Take 1 tablet (180 mg total) by mouth Two (2) times a day. 60 tablet 11   ??? pantoprazole (PROTONIX) 40 MG tablet Take 1 tablet (40 mg total) by mouth daily as needed. 30 tablet PRN   ??? predniSONE (DELTASONE) 5 MG tablet Take 2 tablets (10 mg total) by mouth daily. 60 tablet 11   ??? sulfamethoxazole-trimethoprim (BACTRIM,SEPTRA) 400-80 mg per tablet Take 1 tablet (80 mg of trimethoprim total) by mouth 3 (three) times a week. 12 tablet 3   ??? tacrolimus (PROGRAF) 1 MG capsule 7 mg in am and 6 mg in pm 390 capsule 11   ??? tbo-filgrastim (GRANIX) 300 mcg/0.5 mL Syrg injection Take directed 3 Syringe 0   ??? valGANciclovir (VALCYTE) 450 mg tablet Take 1 tablet (450 mg total) by mouth daily. 128 tablet 0     No current facility-administered medications for this visit.        ALLERGIES:  Latex; Hydrocodone; and Hydrocodone-acetaminophen      Review of Systems  {ros; complete:30496}           Physical Exam:   Blood pressure 137/81, pulse 59, temperature 35.9 ??C (96.6 ??F), temperature source Tympanic, height 160 cm (5' 2.99), weight 73.5 kg (162 lb).  Body mass index is 28.7 kg/m??.    General - ***.  Neuro - alert, oriented, and interactive  Resp - breathing comfortably on room air  CV - sinus rhythm, hemodynamically normal/stable  Abd - soft, nondistended, nontender; ***  Ext - warm and well perfused    LABS:     Lab Results   Component Value Date    WBC 3.4 (L) 12/17/2017    HGB 10.2 (L) 12/17/2017    HCT 35.7 (L) 12/17/2017    PLT 425 12/17/2017     Lab Results   Component Value Date    NA 141 12/17/2017    K 4.8 12/17/2017    CL 108 (H) 12/17/2017    CO2 26.0 12/17/2017    BUN 18 12/17/2017    CREATININE 0.62 12/17/2017    CALCIUM 9.4  12/17/2017    MG 1.7 12/17/2017    PHOS 3.0 12/17/2017     Lab Results   Component Value Date    APTT 27.7 09/25/2017     Lab Results   Component Value Date    ALKPHOS 75 12/17/2017    BILITOT 0.3 12/17/2017    BILIDIR <0.10 12/17/2017    PROT 6.1 (L) 12/17/2017    ALBUMIN 4.2 12/17/2017    ALT 23 12/17/2017    AST 17 12/17/2017    GGT 41 12/17/2017       Imaging:     {TCC Imaging findings:30413480}

## 2017-12-18 MED ORDER — ERGOCALCIFEROL (VITAMIN D2) 1,250 MCG (50,000 UNIT) CAPSULE
ORAL_CAPSULE | ORAL | 0 refills | 0 days | Status: CP
Start: 2017-12-18 — End: 2018-02-11

## 2017-12-18 MED FILL — PROGRAF/1MG/CAP: PROGRAF/1MG/CAP | 30 days supply | Qty: 420 | Fill #0

## 2017-12-18 NOTE — Unmapped (Signed)
Patient to start Vitamin d 50,000 weekly x 8 weeks and then 2000 daily per PharmD Nedra Hai.  Daughter verbalized understanding.

## 2017-12-24 MED FILL — PANTOPRAZOLE/40MG/TBEC: PANTOPRAZOLE/40MG/TBEC | 30 days supply | Qty: 30 | Fill #2

## 2017-12-26 DIAGNOSIS — Z944 Liver transplant status: Secondary | ICD-10-CM | POA: Diagnosis not present

## 2017-12-26 DIAGNOSIS — Z940 Kidney transplant status: Secondary | ICD-10-CM | POA: Diagnosis not present

## 2017-12-27 LAB — CBC W/ DIFFERENTIAL
BANDED NEUTROPHILS ABSOLUTE COUNT: 0.1 10*3/uL (ref 0.0–0.1)
BASOPHILS ABSOLUTE COUNT: 0.1 10*3/uL (ref 0.0–0.2)
BASOPHILS RELATIVE PERCENT: 4 %
EOSINOPHILS RELATIVE PERCENT: 8 %
HEMATOCRIT: 33.9 % — ABNORMAL LOW (ref 34.0–46.6)
HEMOGLOBIN: 10.1 g/dL — ABNORMAL LOW (ref 11.1–15.9)
IMMATURE GRANULOCYTES: 2 %
LYMPHOCYTES ABSOLUTE COUNT: 0.9 10*3/uL (ref 0.7–3.1)
LYMPHOCYTES RELATIVE PERCENT: 28 %
MEAN CORPUSCULAR HEMOGLOBIN CONC: 29.8 g/dL — ABNORMAL LOW (ref 31.5–35.7)
MEAN CORPUSCULAR HEMOGLOBIN: 23.2 pg — ABNORMAL LOW (ref 26.6–33.0)
MEAN CORPUSCULAR VOLUME: 78 fL — ABNORMAL LOW (ref 79–97)
MONOCYTES ABSOLUTE COUNT: 0.6 10*3/uL (ref 0.1–0.9)
MONOCYTES RELATIVE PERCENT: 18 %
NEUTROPHILS ABSOLUTE COUNT: 1.4 10*3/uL (ref 1.4–7.0)
NEUTROPHILS RELATIVE PERCENT: 40 %
PLATELET COUNT: 354 10*3/uL (ref 150–379)
RED BLOOD CELL COUNT: 4.35 x10E6/uL (ref 3.77–5.28)
WHITE BLOOD CELL COUNT: 3.3 10*3/uL — ABNORMAL LOW (ref 3.4–10.8)

## 2017-12-27 LAB — COMPREHENSIVE METABOLIC PANEL
ALBUMIN: 4.2 g/dL (ref 3.6–4.8)
ALKALINE PHOSPHATASE: 84 IU/L (ref 39–117)
ALT (SGPT): 20 IU/L (ref 0–32)
AST (SGOT): 16 IU/L (ref 0–40)
BILIRUBIN TOTAL: <0.2 mg/dL (ref 0.0–1.2)
BUN / CREAT RATIO: 27 (ref 12–28)
CALCIUM: 9.2 mg/dL (ref 8.7–10.3)
CHLORIDE: 108 mmol/L — ABNORMAL HIGH (ref 96–106)
CO2: 21 mmol/L (ref 20–29)
CREATININE: 0.74 mg/dL (ref 0.57–1.00)
GFR MDRD AF AMER: 100 mL/min/{1.73_m2}
GFR MDRD NON AF AMER: 87 mL/min/{1.73_m2}
GLOBULIN, TOTAL: 2.1 g/dL (ref 1.5–4.5)
GLUCOSE: 97 mg/dL (ref 65–99)
POTASSIUM: 5.3 mmol/L — ABNORMAL HIGH (ref 3.5–5.2)
SODIUM: 144 mmol/L (ref 134–144)
TOTAL PROTEIN: 6.3 g/dL (ref 6.0–8.5)

## 2017-12-27 LAB — PHOSPHORUS, SERUM: Lab: 3.3

## 2017-12-27 LAB — MAGNESIUM: Lab: 1.6

## 2017-12-27 LAB — MEAN CORPUSCULAR HEMOGLOBIN CONC: Lab: 29.8 — ABNORMAL LOW

## 2017-12-27 LAB — GAMMA GLUTAMYL TRANSFERASE: Lab: 40

## 2017-12-27 LAB — ALBUMIN: Lab: 4.2

## 2017-12-27 LAB — BILIRUBIN DIRECT: Lab: 0.05

## 2017-12-28 LAB — TACROLIMUS BLOOD: Lab: 3.2

## 2017-12-29 NOTE — Unmapped (Signed)
Per NP Martin-Velez will wait for patient to repeat labs again before adjusting her medication dose.  Patient confirmed no missed doses.  Reviewed with her reporting symptoms of swelling on the right side of incision.  Denied any issues with a hernia.  Also confirmed she did not want an appointment to have someone look at it.  Let her know it sounds like she is still recovering normally for her liver kidney transplant.

## 2018-01-01 NOTE — Unmapped (Signed)
Error

## 2018-01-02 DIAGNOSIS — Z940 Kidney transplant status: Secondary | ICD-10-CM | POA: Diagnosis not present

## 2018-01-02 DIAGNOSIS — Z944 Liver transplant status: Secondary | ICD-10-CM | POA: Diagnosis not present

## 2018-01-03 LAB — CBC W/ DIFFERENTIAL
BANDED NEUTROPHILS ABSOLUTE COUNT: 0.1 10*3/uL (ref 0.0–0.1)
BASOPHILS ABSOLUTE COUNT: 0.1 10*3/uL (ref 0.0–0.2)
BASOPHILS RELATIVE PERCENT: 3 %
EOSINOPHILS ABSOLUTE COUNT: 0.4 10*3/uL (ref 0.0–0.4)
EOSINOPHILS RELATIVE PERCENT: 9 %
HEMATOCRIT: 35.7 % (ref 34.0–46.6)
IMMATURE GRANULOCYTES: 2 %
LYMPHOCYTES ABSOLUTE COUNT: 1.1 10*3/uL (ref 0.7–3.1)
LYMPHOCYTES RELATIVE PERCENT: 25 %
MEAN CORPUSCULAR HEMOGLOBIN CONC: 30.3 g/dL — ABNORMAL LOW (ref 31.5–35.7)
MEAN CORPUSCULAR HEMOGLOBIN: 23.4 pg — ABNORMAL LOW (ref 26.6–33.0)
MEAN CORPUSCULAR VOLUME: 77 fL — ABNORMAL LOW (ref 79–97)
MONOCYTES ABSOLUTE COUNT: 0.9 10*3/uL (ref 0.1–0.9)
MONOCYTES RELATIVE PERCENT: 20 %
NEUTROPHILS ABSOLUTE COUNT: 1.9 10*3/uL (ref 1.4–7.0)
NEUTROPHILS RELATIVE PERCENT: 41 %
PLATELET COUNT: 330 10*3/uL (ref 150–379)
RED CELL DISTRIBUTION WIDTH: 16.5 % — ABNORMAL HIGH (ref 12.3–15.4)
WHITE BLOOD CELL COUNT: 4.5 10*3/uL (ref 3.4–10.8)

## 2018-01-03 LAB — COMPREHENSIVE METABOLIC PANEL
A/G RATIO: 2 (ref 1.2–2.2)
ALBUMIN: 4.4 g/dL (ref 3.6–4.8)
ALKALINE PHOSPHATASE: 78 IU/L (ref 39–117)
ALT (SGPT): 23 IU/L (ref 0–32)
BILIRUBIN TOTAL: <0.2 mg/dL (ref 0.0–1.2)
BUN / CREAT RATIO: 22 (ref 12–28)
CALCIUM: 9.4 mg/dL (ref 8.7–10.3)
CHLORIDE: 107 mmol/L — ABNORMAL HIGH (ref 96–106)
CO2: 23 mmol/L (ref 20–29)
CREATININE: 0.79 mg/dL (ref 0.57–1.00)
GFR MDRD AF AMER: 93 mL/min/{1.73_m2}
GFR MDRD NON AF AMER: 80 mL/min/{1.73_m2}
GLUCOSE: 99 mg/dL (ref 65–99)
POTASSIUM: 5.2 mmol/L (ref 3.5–5.2)
SODIUM: 144 mmol/L (ref 134–144)
TOTAL PROTEIN: 6.6 g/dL (ref 6.0–8.5)

## 2018-01-03 LAB — AST (SGOT): Lab: 18

## 2018-01-03 LAB — MEAN CORPUSCULAR HEMOGLOBIN CONC: Lab: 30.3 — ABNORMAL LOW

## 2018-01-03 LAB — GAMMA GLUTAMYL TRANSFERASE: Lab: 40

## 2018-01-03 LAB — BILIRUBIN DIRECT: Lab: 0.06

## 2018-01-03 LAB — PHOSPHORUS, SERUM: Lab: 3.1

## 2018-01-03 LAB — MAGNESIUM: Lab: 1.6

## 2018-01-04 LAB — TACROLIMUS BLOOD: Lab: 5.3

## 2018-01-04 MED ORDER — ENVARSUS XR 4 MG TABLET,EXTENDED RELEASE
ORAL_TABLET | 11 refills | 0 days | Status: CP
Start: 2018-01-04 — End: 2018-04-01

## 2018-01-04 MED ORDER — ENVARSUS XR 1 MG TABLET,EXTENDED RELEASE
ORAL_TABLET | 11 refills | 0 days | Status: CP
Start: 2018-01-04 — End: 2018-04-01

## 2018-01-05 NOTE — Unmapped (Signed)
Per PharmD Nedra Hai and NP Martin-Velez due to patient variable drug level and reports of headaches consisently she is to start Envarsus 10 mg daily with two 4 mg tablets and two 1 mg tablets for titration purposes.  Daughter verbalized understanding.  Sent e-script for medication and request for pharmacy assistance to [facility].

## 2018-01-06 NOTE — Unmapped (Signed)
Per test claim for ENVARSUS 1MG  AND ENVARSUS 4MG  TABLET(S) at the [facility], patient needs Medication Assistance Program for Prior Authorization. 01/05/17

## 2018-01-07 MED FILL — SULFAMETHOXAZOLE/TRIMETHO/400-80MG/TABS: SULFAMETHOXAZOLE/TRIMETHO/400-80MG/TABS | 28 days supply | Qty: 12 | Fill #3

## 2018-01-07 MED FILL — MYFORTIC/180MG/TAB: MYFORTIC/180MG/TAB | 30 days supply | Qty: 120 | Fill #1

## 2018-01-07 MED FILL — ASPIRIN LOW DOSE/81MG EC/TBEC: ASPIRIN LOW DOSE/81MG EC/TBEC | 30 days supply | Qty: 30 | Fill #2

## 2018-01-08 NOTE — Unmapped (Signed)
Patient does not need a refill of specialty medication at this time. Moving specialty refill reminder call to appropriate date and removed call attempt data.  Spoke with patient's daughter and she states she ordered medications yesterday (01/07/2018) and they should be delivered out to her today.

## 2018-01-09 DIAGNOSIS — Z944 Liver transplant status: Secondary | ICD-10-CM | POA: Diagnosis not present

## 2018-01-09 DIAGNOSIS — Z940 Kidney transplant status: Secondary | ICD-10-CM | POA: Diagnosis not present

## 2018-01-10 LAB — COMPREHENSIVE METABOLIC PANEL
A/G RATIO: 2 (ref 1.2–2.2)
ALBUMIN: 4.2 g/dL (ref 3.6–4.8)
ALKALINE PHOSPHATASE: 82 IU/L (ref 39–117)
ALT (SGPT): 18 IU/L (ref 0–32)
AST (SGOT): 17 IU/L (ref 0–40)
BILIRUBIN TOTAL: <0.2 mg/dL (ref 0.0–1.2)
BLOOD UREA NITROGEN: 21 mg/dL (ref 8–27)
BUN / CREAT RATIO: 30 — ABNORMAL HIGH (ref 12–28)
CALCIUM: 9.5 mg/dL (ref 8.7–10.3)
CHLORIDE: 105 mmol/L (ref 96–106)
CO2: 22 mmol/L (ref 20–29)
CREATININE: 0.69 mg/dL (ref 0.57–1.00)
GFR MDRD AF AMER: 108 mL/min/{1.73_m2}
GFR MDRD NON AF AMER: 94 mL/min/{1.73_m2}
GLOBULIN, TOTAL: 2.1 g/dL (ref 1.5–4.5)
GLUCOSE: 74 mg/dL (ref 65–99)
SODIUM: 144 mmol/L (ref 134–144)

## 2018-01-10 LAB — CBC W/ DIFFERENTIAL
BANDED NEUTROPHILS ABSOLUTE COUNT: 0.1 10*3/uL (ref 0.0–0.1)
BASOPHILS ABSOLUTE COUNT: 0.1 10*3/uL (ref 0.0–0.2)
BASOPHILS RELATIVE PERCENT: 1 %
EOSINOPHILS ABSOLUTE COUNT: 0.2 10*3/uL (ref 0.0–0.4)
EOSINOPHILS RELATIVE PERCENT: 2 %
HEMOGLOBIN: 10.4 g/dL — ABNORMAL LOW (ref 11.1–15.9)
IMMATURE GRANULOCYTES: 1 %
LYMPHOCYTES RELATIVE PERCENT: 18 %
MEAN CORPUSCULAR HEMOGLOBIN CONC: 29.3 g/dL — ABNORMAL LOW (ref 31.5–35.7)
MEAN CORPUSCULAR HEMOGLOBIN: 23.7 pg — ABNORMAL LOW (ref 26.6–33.0)
MEAN CORPUSCULAR VOLUME: 81 fL (ref 79–97)
MONOCYTES ABSOLUTE COUNT: 1 10*3/uL — ABNORMAL HIGH (ref 0.1–0.9)
NEUTROPHILS ABSOLUTE COUNT: 5.6 10*3/uL (ref 1.4–7.0)
NEUTROPHILS RELATIVE PERCENT: 66 %
PLATELET COUNT: 346 10*3/uL (ref 150–379)
RED BLOOD CELL COUNT: 4.39 x10E6/uL (ref 3.77–5.28)
RED CELL DISTRIBUTION WIDTH: 16.6 % — ABNORMAL HIGH (ref 12.3–15.4)

## 2018-01-10 LAB — PHOSPHORUS, SERUM: Lab: 3.5

## 2018-01-10 LAB — BILIRUBIN DIRECT: Lab: 0.06

## 2018-01-10 LAB — CALCIUM: Lab: 9.5

## 2018-01-10 LAB — GAMMA GLUTAMYL TRANSFERASE: Lab: 34

## 2018-01-10 LAB — MAGNESIUM: Lab: 1.5 — ABNORMAL LOW

## 2018-01-10 LAB — LYMPHOCYTES ABSOLUTE COUNT: Lab: 1.5

## 2018-01-11 LAB — TACROLIMUS BLOOD: Lab: 8.7

## 2018-01-16 DIAGNOSIS — Z944 Liver transplant status: Secondary | ICD-10-CM | POA: Diagnosis not present

## 2018-01-16 DIAGNOSIS — Z940 Kidney transplant status: Secondary | ICD-10-CM | POA: Diagnosis not present

## 2018-01-17 LAB — CBC W/ DIFFERENTIAL
BANDED NEUTROPHILS ABSOLUTE COUNT: 0 10*3/uL (ref 0.0–0.1)
BASOPHILS ABSOLUTE COUNT: 0.1 10*3/uL (ref 0.0–0.2)
BASOPHILS RELATIVE PERCENT: 1 %
EOSINOPHILS ABSOLUTE COUNT: 0.2 10*3/uL (ref 0.0–0.4)
EOSINOPHILS RELATIVE PERCENT: 3 %
HEMATOCRIT: 38.2 % (ref 34.0–46.6)
HEMOGLOBIN: 11.5 g/dL (ref 11.1–15.9)
IMMATURE GRANULOCYTES: 0 %
LYMPHOCYTES ABSOLUTE COUNT: 1.1 10*3/uL (ref 0.7–3.1)
LYMPHOCYTES RELATIVE PERCENT: 17 %
MEAN CORPUSCULAR HEMOGLOBIN CONC: 30.1 g/dL — ABNORMAL LOW (ref 31.5–35.7)
MEAN CORPUSCULAR VOLUME: 79 fL (ref 79–97)
MONOCYTES ABSOLUTE COUNT: 0.7 10*3/uL (ref 0.1–0.9)
MONOCYTES RELATIVE PERCENT: 11 %
NEUTROPHILS ABSOLUTE COUNT: 4.4 10*3/uL (ref 1.4–7.0)
PLATELET COUNT: 319 10*3/uL (ref 150–379)
RED BLOOD CELL COUNT: 4.86 x10E6/uL (ref 3.77–5.28)
RED CELL DISTRIBUTION WIDTH: 17.2 % — ABNORMAL HIGH (ref 12.3–15.4)
WHITE BLOOD CELL COUNT: 6.5 10*3/uL (ref 3.4–10.8)

## 2018-01-17 LAB — MAGNESIUM
Lab: 1.9
MAGNESIUM: 1.9 mg/dL (ref 1.6–2.3)

## 2018-01-17 LAB — COMPREHENSIVE METABOLIC PANEL
A/G RATIO: 2 (ref 1.2–2.2)
ALBUMIN: 4.5 g/dL (ref 3.6–4.8)
ALKALINE PHOSPHATASE: 76 IU/L (ref 39–117)
ALT (SGPT): 20 IU/L (ref 0–32)
AST (SGOT): 17 IU/L (ref 0–40)
BILIRUBIN TOTAL: <0.2 mg/dL (ref 0.0–1.2)
BLOOD UREA NITROGEN: 23 mg/dL (ref 8–27)
BUN / CREAT RATIO: 32 — ABNORMAL HIGH (ref 12–28)
CHLORIDE: 106 mmol/L (ref 96–106)
CO2: 21 mmol/L (ref 20–29)
CREATININE: 0.71 mg/dL (ref 0.57–1.00)
GFR MDRD AF AMER: 106 mL/min/{1.73_m2}
GFR MDRD NON AF AMER: 92 mL/min/{1.73_m2}
GLOBULIN, TOTAL: 2.3 g/dL (ref 1.5–4.5)
GLUCOSE: 91 mg/dL (ref 65–99)
POTASSIUM: 4.8 mmol/L (ref 3.5–5.2)
TOTAL PROTEIN: 6.8 g/dL (ref 6.0–8.5)

## 2018-01-17 LAB — BILIRUBIN DIRECT: Lab: 0.05

## 2018-01-17 LAB — CO2: Lab: 21

## 2018-01-17 LAB — GAMMA GLUTAMYL TRANSFERASE: Lab: 36

## 2018-01-17 LAB — PHOSPHORUS, SERUM: Lab: 3.1

## 2018-01-17 LAB — EOSINOPHILS RELATIVE PERCENT: Lab: 3

## 2018-01-18 LAB — TACROLIMUS BLOOD: Lab: 8.6

## 2018-01-23 DIAGNOSIS — Z944 Liver transplant status: Secondary | ICD-10-CM | POA: Diagnosis not present

## 2018-01-23 DIAGNOSIS — Z940 Kidney transplant status: Secondary | ICD-10-CM | POA: Diagnosis not present

## 2018-01-23 MED FILL — PANTOPRAZOLE/40MG/TBEC: PANTOPRAZOLE/40MG/TBEC | 30 days supply | Qty: 30 | Fill #3

## 2018-01-24 LAB — COMPREHENSIVE METABOLIC PANEL
A/G RATIO: 2 (ref 1.2–2.2)
ALBUMIN: 4.3 g/dL (ref 3.6–4.8)
ALKALINE PHOSPHATASE: 78 IU/L (ref 39–117)
ALT (SGPT): 18 IU/L (ref 0–32)
AST (SGOT): 16 IU/L (ref 0–40)
BILIRUBIN TOTAL: <0.2 mg/dL (ref 0.0–1.2)
BLOOD UREA NITROGEN: 19 mg/dL (ref 8–27)
BUN / CREAT RATIO: 26 (ref 12–28)
CALCIUM: 9.5 mg/dL (ref 8.7–10.3)
CHLORIDE: 107 mmol/L — ABNORMAL HIGH (ref 96–106)
CREATININE: 0.72 mg/dL (ref 0.57–1.00)
GFR MDRD AF AMER: 104 mL/min/{1.73_m2}
GFR MDRD NON AF AMER: 90 mL/min/{1.73_m2}
GLOBULIN, TOTAL: 2.1 g/dL (ref 1.5–4.5)
GLUCOSE: 98 mg/dL (ref 65–99)
POTASSIUM: 5.4 mmol/L — ABNORMAL HIGH (ref 3.5–5.2)
SODIUM: 143 mmol/L (ref 134–144)
TOTAL PROTEIN: 6.4 g/dL (ref 6.0–8.5)

## 2018-01-24 LAB — CBC W/ DIFFERENTIAL
BANDED NEUTROPHILS ABSOLUTE COUNT: 0 10*3/uL (ref 0.0–0.1)
BASOPHILS ABSOLUTE COUNT: 0.1 10*3/uL (ref 0.0–0.2)
BASOPHILS RELATIVE PERCENT: 1 %
EOSINOPHILS ABSOLUTE COUNT: 0.2 10*3/uL (ref 0.0–0.4)
EOSINOPHILS RELATIVE PERCENT: 4 %
HEMATOCRIT: 36.3 % (ref 34.0–46.6)
HEMOGLOBIN: 10.9 g/dL — ABNORMAL LOW (ref 11.1–15.9)
IMMATURE GRANULOCYTES: 0 %
LYMPHOCYTES ABSOLUTE COUNT: 1.1 10*3/uL (ref 0.7–3.1)
LYMPHOCYTES RELATIVE PERCENT: 20 %
MEAN CORPUSCULAR HEMOGLOBIN CONC: 30 g/dL — ABNORMAL LOW (ref 31.5–35.7)
MEAN CORPUSCULAR HEMOGLOBIN: 23.7 pg — ABNORMAL LOW (ref 26.6–33.0)
MEAN CORPUSCULAR VOLUME: 79 fL (ref 79–97)
MONOCYTES ABSOLUTE COUNT: 0.7 10*3/uL (ref 0.1–0.9)
MONOCYTES RELATIVE PERCENT: 13 %
PLATELET COUNT: 331 10*3/uL (ref 150–379)
RED BLOOD CELL COUNT: 4.59 x10E6/uL (ref 3.77–5.28)
RED CELL DISTRIBUTION WIDTH: 17.6 % — ABNORMAL HIGH (ref 12.3–15.4)

## 2018-01-24 LAB — HEMOGLOBIN: Lab: 10.9 — ABNORMAL LOW

## 2018-01-24 LAB — PHOSPHORUS, SERUM: Lab: 2.9

## 2018-01-24 LAB — GFR MDRD NON AF AMER: Lab: 90

## 2018-01-24 LAB — GAMMA GLUTAMYL TRANSFERASE: Lab: 34

## 2018-01-24 LAB — BILIRUBIN DIRECT: Lab: 0.05

## 2018-01-24 LAB — MAGNESIUM: Lab: 1.6

## 2018-01-25 LAB — TACROLIMUS BLOOD: Lab: 6.8

## 2018-01-25 NOTE — Unmapped (Signed)
Spoke with patient for 15 minutes about low potassium diet.  Also discussed her 10 pound weight gain and current diet with eating potatoe chips.  Discussed healthy alternatives to try instead to help with her potassium and her diverticulitis.  Offered patient to come over early for  appointment with nutritionist and clinical and she declined at this time stating she is okay being seen in May.

## 2018-01-28 ENCOUNTER — Other Ambulatory Visit (HOSPITAL_COMMUNITY): Payer: Self-pay | Admitting: Internal Medicine

## 2018-01-28 DIAGNOSIS — Z1231 Encounter for screening mammogram for malignant neoplasm of breast: Secondary | ICD-10-CM

## 2018-01-30 DIAGNOSIS — Z944 Liver transplant status: Secondary | ICD-10-CM | POA: Diagnosis not present

## 2018-01-30 DIAGNOSIS — Z940 Kidney transplant status: Secondary | ICD-10-CM | POA: Diagnosis not present

## 2018-01-31 LAB — CBC W/ DIFFERENTIAL
BANDED NEUTROPHILS ABSOLUTE COUNT: 0 10*3/uL (ref 0.0–0.1)
BASOPHILS ABSOLUTE COUNT: 0 10*3/uL (ref 0.0–0.2)
BASOPHILS RELATIVE PERCENT: 1 %
EOSINOPHILS RELATIVE PERCENT: 3 %
HEMATOCRIT: 35 % (ref 34.0–46.6)
HEMOGLOBIN: 10.6 g/dL — ABNORMAL LOW (ref 11.1–15.9)
IMMATURE GRANULOCYTES: 0 %
LYMPHOCYTES ABSOLUTE COUNT: 0.9 10*3/uL (ref 0.7–3.1)
LYMPHOCYTES RELATIVE PERCENT: 18 %
MEAN CORPUSCULAR HEMOGLOBIN CONC: 30.3 g/dL — ABNORMAL LOW (ref 31.5–35.7)
MEAN CORPUSCULAR HEMOGLOBIN: 23.7 pg — ABNORMAL LOW (ref 26.6–33.0)
MONOCYTES ABSOLUTE COUNT: 0.6 10*3/uL (ref 0.1–0.9)
MONOCYTES RELATIVE PERCENT: 12 %
NEUTROPHILS ABSOLUTE COUNT: 3.3 10*3/uL (ref 1.4–7.0)
NEUTROPHILS RELATIVE PERCENT: 66 %
PLATELET COUNT: 302 10*3/uL (ref 150–379)
RED BLOOD CELL COUNT: 4.48 x10E6/uL (ref 3.77–5.28)
RED CELL DISTRIBUTION WIDTH: 18.4 % — ABNORMAL HIGH (ref 12.3–15.4)

## 2018-01-31 LAB — COMPREHENSIVE METABOLIC PANEL
A/G RATIO: 2.3 — ABNORMAL HIGH (ref 1.2–2.2)
ALBUMIN: 4.2 g/dL (ref 3.6–4.8)
ALKALINE PHOSPHATASE: 66 IU/L (ref 39–117)
ALT (SGPT): 15 IU/L (ref 0–32)
AST (SGOT): 17 IU/L (ref 0–40)
BLOOD UREA NITROGEN: 21 mg/dL (ref 8–27)
BUN / CREAT RATIO: 25 (ref 12–28)
CHLORIDE: 107 mmol/L — ABNORMAL HIGH (ref 96–106)
CO2: 23 mmol/L (ref 20–29)
CREATININE: 0.85 mg/dL (ref 0.57–1.00)
GFR MDRD AF AMER: 85 mL/min/{1.73_m2}
GFR MDRD NON AF AMER: 74 mL/min/{1.73_m2}
GLOBULIN, TOTAL: 1.8 g/dL (ref 1.5–4.5)
GLUCOSE: 99 mg/dL (ref 65–99)
POTASSIUM: 5 mmol/L (ref 3.5–5.2)
SODIUM: 144 mmol/L (ref 134–144)
TOTAL PROTEIN: 6 g/dL (ref 6.0–8.5)

## 2018-01-31 LAB — GAMMA GLUTAMYL TRANSFERASE: Lab: 29

## 2018-01-31 LAB — BILIRUBIN DIRECT: Lab: 0.08

## 2018-01-31 LAB — AST (SGOT): Lab: 17

## 2018-01-31 LAB — MEAN CORPUSCULAR VOLUME: Lab: 78 — ABNORMAL LOW

## 2018-01-31 LAB — MAGNESIUM: Lab: 1.6

## 2018-01-31 LAB — PHOSPHORUS, SERUM: Lab: 3.4

## 2018-02-01 LAB — TACROLIMUS BLOOD: Lab: 8.7

## 2018-02-04 NOTE — Unmapped (Signed)
Confirmed with patient labs look good.

## 2018-02-05 NOTE — Unmapped (Signed)
[facility] Refill Coordination Note  Specialty Medication(s): Myfortic 180mg , Envarsus 1mg  & 4mg    Additional Medications shipped: Aspirin 81mg  & Sulfa 400-80mg ,     Michelle Travis, DOB: 1956/10/19  Phone: [phone] (home) [phone] (work), Alternate phone contact: N/A  Phone or address changes today?: No  All above HIPAA information was verified with patient.  Shipping Address: [address]  [place] [address]   Insurance changes? No    Completed refill call assessment today to schedule patient's medication shipment from the [facility] [phone]).      Confirmed the medication and dosage are correct and have not changed: Yes, regimen is correct and unchanged.    Confirmed patient started or stopped the following medications in the past month:  No, there are no changes reported at this time.    Are you tolerating your medication?:  Solae reports tolerating the medication.    ADHERENCE    (Below is required for Medicare Part B or Transplant patients only - per drug):   How many tablets were dispensed last month:     Note: Envarsus 1mg  & 4mg  are medication therapy.**  Myfortic 180 mg   Quantity filled last month: 120   # of tablets left on hand: 7 days    Did you miss any doses in the past 4 weeks? No missed doses reported.    FINANCIAL/SHIPPING    Delivery Scheduled: Yes, Expected medication delivery date: 02/08/2018     The patient will receive an FSI print out for each medication shipped and additional FDA Medication Guides as required.  Patient education from [place] or [name] may also be included in the shipment    [place] did not have any additional questions at this time.    Delivery address validated in FSI scheduling system: Yes, address listed in FSI is correct.    We will follow up with patient monthly for standard refill processing and delivery.      Thank you,  Tamala Fothergill   [facility] Specialty Technician

## 2018-02-06 ENCOUNTER — Ambulatory Visit (HOSPITAL_COMMUNITY): Payer: Medicare HMO

## 2018-02-06 ENCOUNTER — Ambulatory Visit (HOSPITAL_COMMUNITY)
Admission: RE | Admit: 2018-02-06 | Discharge: 2018-02-06 | Disposition: A | Discharge status: Home / Self Care | Payer: Medicare HMO | Source: Ambulatory Visit | Attending: Internal Medicine | Admitting: Internal Medicine

## 2018-02-06 ENCOUNTER — Encounter (HOSPITAL_COMMUNITY): Payer: Self-pay

## 2018-02-06 DIAGNOSIS — Z940 Kidney transplant status: Secondary | ICD-10-CM | POA: Diagnosis not present

## 2018-02-06 DIAGNOSIS — Z1231 Encounter for screening mammogram for malignant neoplasm of breast: Secondary | ICD-10-CM | POA: Diagnosis not present

## 2018-02-06 DIAGNOSIS — Z944 Liver transplant status: Secondary | ICD-10-CM | POA: Diagnosis not present

## 2018-02-07 LAB — CBC W/ DIFFERENTIAL
BANDED NEUTROPHILS ABSOLUTE COUNT: 0 10*3/uL (ref 0.0–0.1)
BASOPHILS ABSOLUTE COUNT: 0 10*3/uL (ref 0.0–0.2)
BASOPHILS RELATIVE PERCENT: 1 %
EOSINOPHILS ABSOLUTE COUNT: 0.1 10*3/uL (ref 0.0–0.4)
EOSINOPHILS RELATIVE PERCENT: 3 %
HEMATOCRIT: 36.5 % (ref 34.0–46.6)
HEMOGLOBIN: 11.2 g/dL (ref 11.1–15.9)
IMMATURE GRANULOCYTES: 0 %
LYMPHOCYTES ABSOLUTE COUNT: 0.9 10*3/uL (ref 0.7–3.1)
MEAN CORPUSCULAR HEMOGLOBIN: 23.8 pg — ABNORMAL LOW (ref 26.6–33.0)
MEAN CORPUSCULAR VOLUME: 78 fL — ABNORMAL LOW (ref 79–97)
MONOCYTES ABSOLUTE COUNT: 0.5 10*3/uL (ref 0.1–0.9)
MONOCYTES RELATIVE PERCENT: 12 %
NEUTROPHILS ABSOLUTE COUNT: 2.8 10*3/uL (ref 1.4–7.0)
NEUTROPHILS RELATIVE PERCENT: 64 %
PLATELET COUNT: 311 10*3/uL (ref 150–379)
RED CELL DISTRIBUTION WIDTH: 18.5 % — ABNORMAL HIGH (ref 12.3–15.4)
WHITE BLOOD CELL COUNT: 4.4 10*3/uL (ref 3.4–10.8)

## 2018-02-07 LAB — COMPREHENSIVE METABOLIC PANEL
A/G RATIO: 2 (ref 1.2–2.2)
ALBUMIN: 4.3 g/dL (ref 3.6–4.8)
ALKALINE PHOSPHATASE: 75 IU/L (ref 39–117)
ALT (SGPT): 20 IU/L (ref 0–32)
AST (SGOT): 19 IU/L (ref 0–40)
BILIRUBIN TOTAL: 0.2 mg/dL (ref 0.0–1.2)
BLOOD UREA NITROGEN: 20 mg/dL (ref 8–27)
BUN / CREAT RATIO: 24 (ref 12–28)
CALCIUM: 9.5 mg/dL (ref 8.7–10.3)
CHLORIDE: 104 mmol/L (ref 96–106)
CO2: 23 mmol/L (ref 20–29)
CREATININE: 0.82 mg/dL (ref 0.57–1.00)
GFR MDRD AF AMER: 89 mL/min/{1.73_m2}
GLOBULIN, TOTAL: 2.2 g/dL (ref 1.5–4.5)
GLUCOSE: 100 mg/dL — ABNORMAL HIGH (ref 65–99)
POTASSIUM: 5.1 mmol/L (ref 3.5–5.2)
SODIUM: 141 mmol/L (ref 134–144)
TOTAL PROTEIN: 6.5 g/dL (ref 6.0–8.5)

## 2018-02-07 LAB — GAMMA GLUTAMYL TRANSFERASE: Lab: 31

## 2018-02-07 LAB — MAGNESIUM: Lab: 1.5 — ABNORMAL LOW

## 2018-02-07 LAB — A/G RATIO: Lab: 2

## 2018-02-07 LAB — PHOSPHORUS, SERUM: Lab: 3.1

## 2018-02-07 LAB — WHITE BLOOD CELL COUNT: Lab: 4.4

## 2018-02-07 LAB — BILIRUBIN DIRECT: Lab: 0.07

## 2018-02-07 LAB — BILIRUBIN, DIRECT: BILIRUBIN DIRECT: 0.07 mg/dL (ref 0.00–0.40)

## 2018-02-08 LAB — TACROLIMUS BLOOD: Lab: 8.3

## 2018-02-08 NOTE — Unmapped (Signed)
Left VM letting patient know labs are good and to reduce vitamin D to 2000 units/day.  Asked to be called back to confirm.

## 2018-02-11 MED ORDER — SULFAMETHOXAZOLE 400 MG-TRIMETHOPRIM 80 MG TABLET
ORAL_TABLET | ORAL | 3 refills | 0 days | Status: CP
Start: 2018-02-11 — End: 2018-03-19

## 2018-02-11 MED ORDER — CHOLECALCIFEROL (VITAMIN D3) 25 MCG (1,000 UNIT) TABLET
ORAL_TABLET | Freq: Every day | ORAL | 11 refills | 0 days | Status: CP
Start: 2018-02-11 — End: 2019-02-11

## 2018-02-11 MED FILL — MYFORTIC/180MG/TAB: MYFORTIC/180MG/TAB | 30 days supply | Qty: 120 | Fill #2

## 2018-02-11 MED FILL — ASPIRIN LOW DOSE/81MG EC/TBEC: ASPIRIN LOW DOSE/81MG EC/TBEC | 30 days supply | Qty: 30 | Fill #3

## 2018-02-11 NOTE — Unmapped (Signed)
Addended by: Mariea Stable A on: 02/11/2018 09:30 AM     Modules accepted: Orders

## 2018-02-11 NOTE — Unmapped (Addendum)
Per patient sent script for bactrim to local pharmacy since she is out today. Also sent script for 2000 units of vit d a day per PharmD Nedra Hai see note from 12/18/17 for details. Patient verbalized understanding.

## 2018-02-13 DIAGNOSIS — Z940 Kidney transplant status: Secondary | ICD-10-CM | POA: Diagnosis not present

## 2018-02-13 DIAGNOSIS — Z944 Liver transplant status: Secondary | ICD-10-CM | POA: Diagnosis not present

## 2018-02-14 LAB — PHOSPHORUS, SERUM: Lab: 3.1

## 2018-02-14 LAB — GAMMA GLUTAMYL TRANSFERASE: Lab: 37

## 2018-02-14 LAB — COMPREHENSIVE METABOLIC PANEL
A/G RATIO: 1.8 (ref 1.2–2.2)
ALBUMIN: 4.1 g/dL (ref 3.6–4.8)
ALKALINE PHOSPHATASE: 81 IU/L (ref 39–117)
ALT (SGPT): 25 IU/L (ref 0–32)
AST (SGOT): 20 IU/L (ref 0–40)
BILIRUBIN TOTAL: 0.2 mg/dL (ref 0.0–1.2)
BLOOD UREA NITROGEN: 21 mg/dL (ref 8–27)
BUN / CREAT RATIO: 26 (ref 12–28)
CHLORIDE: 106 mmol/L (ref 96–106)
CO2: 20 mmol/L (ref 20–29)
CREATININE: 0.82 mg/dL (ref 0.57–1.00)
GFR MDRD AF AMER: 89 mL/min/{1.73_m2}
GFR MDRD NON AF AMER: 77 mL/min/{1.73_m2}
GLUCOSE: 99 mg/dL (ref 65–99)
POTASSIUM: 4.8 mmol/L (ref 3.5–5.2)
SODIUM: 142 mmol/L (ref 134–144)

## 2018-02-14 LAB — CBC W/ DIFFERENTIAL
BANDED NEUTROPHILS ABSOLUTE COUNT: 0 10*3/uL (ref 0.0–0.1)
BASOPHILS ABSOLUTE COUNT: 0 10*3/uL (ref 0.0–0.2)
EOSINOPHILS RELATIVE PERCENT: 2 %
HEMATOCRIT: 34.6 % (ref 34.0–46.6)
HEMOGLOBIN: 11 g/dL — ABNORMAL LOW (ref 11.1–15.9)
IMMATURE GRANULOCYTES: 0 %
LYMPHOCYTES ABSOLUTE COUNT: 0.9 10*3/uL (ref 0.7–3.1)
LYMPHOCYTES RELATIVE PERCENT: 23 %
MEAN CORPUSCULAR HEMOGLOBIN CONC: 31.8 g/dL (ref 31.5–35.7)
MEAN CORPUSCULAR HEMOGLOBIN: 24.1 pg — ABNORMAL LOW (ref 26.6–33.0)
MEAN CORPUSCULAR VOLUME: 76 fL — ABNORMAL LOW (ref 79–97)
MONOCYTES ABSOLUTE COUNT: 0.6 10*3/uL (ref 0.1–0.9)
MONOCYTES RELATIVE PERCENT: 14 %
NEUTROPHILS ABSOLUTE COUNT: 2.4 10*3/uL (ref 1.4–7.0)
NEUTROPHILS RELATIVE PERCENT: 60 %
PLATELET COUNT: 307 10*3/uL (ref 150–379)
RED BLOOD CELL COUNT: 4.57 x10E6/uL (ref 3.77–5.28)
RED CELL DISTRIBUTION WIDTH: 19.3 % — ABNORMAL HIGH (ref 12.3–15.4)

## 2018-02-14 LAB — BILIRUBIN DIRECT: Lab: 0.07

## 2018-02-14 LAB — MEAN CORPUSCULAR HEMOGLOBIN: Lab: 24.1 — ABNORMAL LOW

## 2018-02-14 LAB — MAGNESIUM: Lab: 1.5 — ABNORMAL LOW

## 2018-02-14 LAB — ALT (SGPT): Lab: 25

## 2018-02-15 LAB — TACROLIMUS BLOOD: Lab: 11.2

## 2018-02-18 NOTE — Unmapped (Addendum)
Patient confirmed drug level from last Wednesday is reliable.  Confirmed with [facility] patient approved for Envarsus and to call [employer] for shipping at [phone].  Patient agreed to call and then let me know when medication arrives.  Messaged pharmacist to see if dose reduction in tacrolimus is needed, what Envarsus dose should be, and the schedule for switching from tacrolimus to Envarsus.      Per Pharm Mincemoyer patient to decrease tacrolimus to 6 mg in am and 5 mg in pm.  (If the dose stays the same patient to start Envarsus 8 mg daily 12 hours after night time dose in the am whenever the medication dose arrive.)  Left VM for patient to reduce tac dose and to call back to confirm dose change.

## 2018-02-19 MED ORDER — TACROLIMUS 1 MG CAPSULE
ORAL_CAPSULE | 11 refills | 0 days | Status: CP
Start: 2018-02-19 — End: 2018-02-26

## 2018-02-19 NOTE — Unmapped (Signed)
Patient confirmed Envarsus will be arriving today or tomorrow. Agreed to call when drug arrived.  Verbalized understanding to how to start Envarsus in the morning and stop Prograf.  Confirmed she reduced her dose of tac already to 6 and 5 as instructed via VM yesterday.

## 2018-02-20 DIAGNOSIS — Z940 Kidney transplant status: Secondary | ICD-10-CM | POA: Diagnosis not present

## 2018-02-20 DIAGNOSIS — Z944 Liver transplant status: Secondary | ICD-10-CM | POA: Diagnosis not present

## 2018-02-21 LAB — COMPREHENSIVE METABOLIC PANEL
A/G RATIO: 2.3 — ABNORMAL HIGH (ref 1.2–2.2)
ALBUMIN: 4.3 g/dL (ref 3.6–4.8)
ALT (SGPT): 20 IU/L (ref 0–32)
AST (SGOT): 13 IU/L (ref 0–40)
BILIRUBIN TOTAL: 0.2 mg/dL (ref 0.0–1.2)
BUN / CREAT RATIO: 24 (ref 12–28)
CALCIUM: 9.1 mg/dL (ref 8.7–10.3)
CHLORIDE: 107 mmol/L — ABNORMAL HIGH (ref 96–106)
CO2: 23 mmol/L (ref 20–29)
CREATININE: 0.74 mg/dL (ref 0.57–1.00)
GFR MDRD NON AF AMER: 87 mL/min/{1.73_m2}
GLOBULIN, TOTAL: 1.9 g/dL (ref 1.5–4.5)
GLUCOSE: 112 mg/dL — ABNORMAL HIGH (ref 65–99)
POTASSIUM: 4.9 mmol/L (ref 3.5–5.2)
SODIUM: 144 mmol/L (ref 134–144)
TOTAL PROTEIN: 6.2 g/dL (ref 6.0–8.5)

## 2018-02-21 LAB — CBC W/ DIFFERENTIAL
BANDED NEUTROPHILS ABSOLUTE COUNT: 0 10*3/uL (ref 0.0–0.1)
BASOPHILS RELATIVE PERCENT: 0 %
EOSINOPHILS ABSOLUTE COUNT: 0.1 10*3/uL (ref 0.0–0.4)
EOSINOPHILS RELATIVE PERCENT: 3 %
HEMATOCRIT: 34.3 % (ref 34.0–46.6)
HEMOGLOBIN: 11 g/dL — ABNORMAL LOW (ref 11.1–15.9)
IMMATURE GRANULOCYTES: 0 %
LYMPHOCYTES ABSOLUTE COUNT: 1 10*3/uL (ref 0.7–3.1)
LYMPHOCYTES RELATIVE PERCENT: 27 %
MEAN CORPUSCULAR HEMOGLOBIN CONC: 32.1 g/dL (ref 31.5–35.7)
MEAN CORPUSCULAR HEMOGLOBIN: 25.2 pg — ABNORMAL LOW (ref 26.6–33.0)
MEAN CORPUSCULAR VOLUME: 79 fL (ref 79–97)
MONOCYTES ABSOLUTE COUNT: 0.6 10*3/uL (ref 0.1–0.9)
MONOCYTES RELATIVE PERCENT: 15 %
NEUTROPHILS ABSOLUTE COUNT: 2.1 10*3/uL (ref 1.4–7.0)
NEUTROPHILS RELATIVE PERCENT: 55 %
RED BLOOD CELL COUNT: 4.36 x10E6/uL (ref 3.77–5.28)
RED CELL DISTRIBUTION WIDTH: 18.9 % — ABNORMAL HIGH (ref 12.3–15.4)
WHITE BLOOD CELL COUNT: 3.8 10*3/uL (ref 3.4–10.8)

## 2018-02-21 LAB — PHOSPHORUS, SERUM: Lab: 2.6

## 2018-02-21 LAB — GAMMA GLUTAMYL TRANSFERASE: Lab: 39

## 2018-02-21 LAB — MEAN CORPUSCULAR HEMOGLOBIN: Lab: 25.2 — ABNORMAL LOW

## 2018-02-21 LAB — MAGNESIUM: Lab: 1.6

## 2018-02-21 LAB — TOTAL PROTEIN: Lab: 6.2

## 2018-02-21 LAB — BILIRUBIN DIRECT: Lab: 0.08

## 2018-02-22 LAB — TACROLIMUS BLOOD: Lab: 7.7

## 2018-02-26 NOTE — Unmapped (Signed)
[facility] Refill Coordination Note  Specialty Medication(s): Bactrim  Pantoprazole  Prednisone 5mg  bid      Michelle Travis, DOB: Jul 18, 1956  Phone: [phone] (home) [phone] (work), Alternate phone contact: N/A  Phone or address changes today?: No  All above HIPAA information was verified with patient's family member.  Shipping Address: [address]  [place] [address]   Insurance changes? No    Completed refill call assessment today to schedule patient's medication shipment from the [facility] [phone]).      Confirmed the medication and dosage are correct and have not changed: Yes, regimen is correct and unchanged.    Confirmed patient started or stopped the following medications in the past month:  No, there are no changes reported at this time.    Are you tolerating your medication?:  Michelle Travis reports tolerating the medication.    ADHERENCE        Did you miss any doses in the past 4 weeks? No missed doses reported.    FINANCIAL/SHIPPING    Delivery Scheduled: Yes, Expected medication delivery date: 5/2     The patient will receive an FSI print out for each medication shipped and additional FDA Medication Guides as required.  Patient education from [place] or [name] may also be included in the shipment    Michelle Travis did not have any additional questions at this time.    Delivery address validated in FSI scheduling system: Yes, address listed in FSI is correct.    We will follow up with patient monthly for standard refill processing and delivery.      Thank you,  [name]   [facility] Specialty Technician

## 2018-02-27 DIAGNOSIS — Z940 Kidney transplant status: Secondary | ICD-10-CM | POA: Diagnosis not present

## 2018-02-27 DIAGNOSIS — Z944 Liver transplant status: Secondary | ICD-10-CM | POA: Diagnosis not present

## 2018-02-27 MED FILL — PANTOPRAZOLE/40MG/TBEC: PANTOPRAZOLE/40MG/TBEC | 30 days supply | Qty: 30 | Fill #4

## 2018-02-27 MED FILL — PREDNISONE/5MG/TABS: PREDNISONE/5MG/TABS | 30 days supply | Qty: 60 | Fill #0

## 2018-02-28 LAB — CBC W/ DIFFERENTIAL
BANDED NEUTROPHILS ABSOLUTE COUNT: 0 10*3/uL (ref 0.0–0.1)
BASOPHILS ABSOLUTE COUNT: 0 10*3/uL (ref 0.0–0.2)
BASOPHILS RELATIVE PERCENT: 1 %
EOSINOPHILS ABSOLUTE COUNT: 0.1 10*3/uL (ref 0.0–0.4)
EOSINOPHILS RELATIVE PERCENT: 3 %
HEMATOCRIT: 35.7 % (ref 34.0–46.6)
HEMOGLOBIN: 11.1 g/dL (ref 11.1–15.9)
LYMPHOCYTES ABSOLUTE COUNT: 1.4 10*3/uL (ref 0.7–3.1)
LYMPHOCYTES RELATIVE PERCENT: 32 %
MEAN CORPUSCULAR HEMOGLOBIN CONC: 31.1 g/dL — ABNORMAL LOW (ref 31.5–35.7)
MEAN CORPUSCULAR HEMOGLOBIN: 24.5 pg — ABNORMAL LOW (ref 26.6–33.0)
MONOCYTES ABSOLUTE COUNT: 0.7 10*3/uL (ref 0.1–0.9)
MONOCYTES RELATIVE PERCENT: 16 %
NEUTROPHILS ABSOLUTE COUNT: 2.1 10*3/uL (ref 1.4–7.0)
PLATELET COUNT: 277 10*3/uL (ref 150–379)
RED BLOOD CELL COUNT: 4.53 x10E6/uL (ref 3.77–5.28)
RED CELL DISTRIBUTION WIDTH: 19.3 % — ABNORMAL HIGH (ref 12.3–15.4)
WHITE BLOOD CELL COUNT: 4.3 10*3/uL (ref 3.4–10.8)

## 2018-02-28 LAB — COMPREHENSIVE METABOLIC PANEL
A/G RATIO: 2.1 (ref 1.2–2.2)
ALBUMIN: 4.1 g/dL (ref 3.6–4.8)
ALKALINE PHOSPHATASE: 79 IU/L (ref 39–117)
ALT (SGPT): 22 IU/L (ref 0–32)
BILIRUBIN TOTAL: 0.3 mg/dL (ref 0.0–1.2)
BLOOD UREA NITROGEN: 22 mg/dL (ref 8–27)
BUN / CREAT RATIO: 27 (ref 12–28)
CALCIUM: 9.2 mg/dL (ref 8.7–10.3)
CHLORIDE: 101 mmol/L (ref 96–106)
CO2: 22 mmol/L (ref 20–29)
CREATININE: 0.83 mg/dL (ref 0.57–1.00)
GFR MDRD AF AMER: 87 mL/min/{1.73_m2}
GFR MDRD NON AF AMER: 76 mL/min/{1.73_m2}
GLOBULIN, TOTAL: 2 g/dL (ref 1.5–4.5)
GLUCOSE: 100 mg/dL — ABNORMAL HIGH (ref 65–99)
POTASSIUM: 4.8 mmol/L (ref 3.5–5.2)
SODIUM: 139 mmol/L (ref 134–144)

## 2018-02-28 LAB — IMMATURE GRANULOCYTES: Lab: 0

## 2018-02-28 LAB — PHOSPHORUS, SERUM: Lab: 3.6

## 2018-02-28 LAB — CALCIUM: Lab: 9.2

## 2018-02-28 LAB — MAGNESIUM: Lab: 1.6

## 2018-02-28 LAB — BILIRUBIN DIRECT: Lab: 0.07

## 2018-02-28 LAB — GAMMA GLUTAMYL TRANSFERASE: Lab: 41

## 2018-02-28 NOTE — Unmapped (Signed)
Spoke with daughter and she reported patient started Envarsus Tuesday agreed to repeat labs next week.

## 2018-03-01 LAB — TACROLIMUS BLOOD: Lab: 4.8

## 2018-03-04 MED FILL — SULFAMETHOXAZOLE/TRIMETHO/400-80MG/TABS: SULFAMETHOXAZOLE/TRIMETHO/400-80MG/TABS | 28 days supply | Qty: 12 | Fill #4

## 2018-03-05 NOTE — Unmapped (Signed)
Per PharmD Mincemoyer no intervention at this time for low tac level since it will take a bit for the Envarsus to reach therapeutic goal level.

## 2018-03-06 DIAGNOSIS — Z940 Kidney transplant status: Secondary | ICD-10-CM | POA: Diagnosis not present

## 2018-03-06 DIAGNOSIS — Z944 Liver transplant status: Secondary | ICD-10-CM | POA: Diagnosis not present

## 2018-03-07 LAB — BASOPHILS RELATIVE PERCENT: Lab: 1

## 2018-03-07 LAB — BILIRUBIN DIRECT: Lab: 0.07

## 2018-03-07 LAB — CBC W/ DIFFERENTIAL
BANDED NEUTROPHILS ABSOLUTE COUNT: 0 10*3/uL (ref 0.0–0.1)
BASOPHILS ABSOLUTE COUNT: 0 10*3/uL (ref 0.0–0.2)
EOSINOPHILS ABSOLUTE COUNT: 0.1 10*3/uL (ref 0.0–0.4)
EOSINOPHILS RELATIVE PERCENT: 3 %
HEMATOCRIT: 34 % (ref 34.0–46.6)
HEMOGLOBIN: 10.5 g/dL — ABNORMAL LOW (ref 11.1–15.9)
IMMATURE GRANULOCYTES: 0 %
LYMPHOCYTES ABSOLUTE COUNT: 1.4 10*3/uL (ref 0.7–3.1)
LYMPHOCYTES RELATIVE PERCENT: 36 %
MEAN CORPUSCULAR HEMOGLOBIN CONC: 30.9 g/dL — ABNORMAL LOW (ref 31.5–35.7)
MEAN CORPUSCULAR HEMOGLOBIN: 24.1 pg — ABNORMAL LOW (ref 26.6–33.0)
MEAN CORPUSCULAR VOLUME: 78 fL — ABNORMAL LOW (ref 79–97)
MONOCYTES ABSOLUTE COUNT: 0.6 10*3/uL (ref 0.1–0.9)
MONOCYTES RELATIVE PERCENT: 15 %
NEUTROPHILS RELATIVE PERCENT: 45 %
PLATELET COUNT: 293 10*3/uL (ref 150–379)
RED BLOOD CELL COUNT: 4.36 x10E6/uL (ref 3.77–5.28)
RED CELL DISTRIBUTION WIDTH: 18.7 % — ABNORMAL HIGH (ref 12.3–15.4)
WHITE BLOOD CELL COUNT: 3.9 10*3/uL (ref 3.4–10.8)

## 2018-03-07 LAB — COMPREHENSIVE METABOLIC PANEL
A/G RATIO: 2.3 — ABNORMAL HIGH (ref 1.2–2.2)
ALBUMIN: 4.3 g/dL (ref 3.6–4.8)
ALKALINE PHOSPHATASE: 69 IU/L (ref 39–117)
ALT (SGPT): 16 IU/L (ref 0–32)
AST (SGOT): 15 IU/L (ref 0–40)
BLOOD UREA NITROGEN: 14 mg/dL (ref 8–27)
BUN / CREAT RATIO: 20 (ref 12–28)
CALCIUM: 9.3 mg/dL (ref 8.7–10.3)
CHLORIDE: 106 mmol/L (ref 96–106)
CO2: 24 mmol/L (ref 20–29)
CREATININE: 0.71 mg/dL (ref 0.57–1.00)
GFR MDRD AF AMER: 106 mL/min/{1.73_m2}
GFR MDRD NON AF AMER: 92 mL/min/{1.73_m2}
GLOBULIN, TOTAL: 1.9 g/dL (ref 1.5–4.5)
GLUCOSE: 101 mg/dL — ABNORMAL HIGH (ref 65–99)
POTASSIUM: 4.3 mmol/L (ref 3.5–5.2)
SODIUM: 144 mmol/L (ref 134–144)

## 2018-03-07 LAB — MAGNESIUM: Lab: 1.8

## 2018-03-07 LAB — POTASSIUM: Lab: 4.3

## 2018-03-07 LAB — PHOSPHORUS, SERUM: Lab: 2.7

## 2018-03-07 LAB — GAMMA GLUTAMYL TRANSFERASE: Lab: 35

## 2018-03-08 LAB — TACROLIMUS BLOOD: Lab: 1.3 — ABNORMAL LOW

## 2018-03-08 NOTE — Unmapped (Signed)
Called patient about low tac level.  She was only taking 2 mg of Envarsus daily.  Per PharmD Mincemoyer patient to take 8 mg of Envarsus right now then start Envarsus 10 mg daily tomorrow at 10 am patient verbalized understanding.

## 2018-03-13 DIAGNOSIS — Z944 Liver transplant status: Secondary | ICD-10-CM | POA: Diagnosis not present

## 2018-03-13 DIAGNOSIS — Z940 Kidney transplant status: Secondary | ICD-10-CM | POA: Diagnosis not present

## 2018-03-13 NOTE — Unmapped (Signed)
Patient's daughter called in. She reports patient still has 30 days of Myfortic. Envasus is through another pharmacy altogether. Scheduled asa and pantoprazole. Moving call out 2 weeks.

## 2018-03-14 LAB — COMPREHENSIVE METABOLIC PANEL
A/G RATIO: 1.9 (ref 1.2–2.2)
ALBUMIN: 4.2 g/dL (ref 3.6–4.8)
ALKALINE PHOSPHATASE: 68 IU/L (ref 39–117)
ALT (SGPT): 17 IU/L (ref 0–32)
AST (SGOT): 17 IU/L (ref 0–40)
BILIRUBIN TOTAL: <0.2 mg/dL (ref 0.0–1.2)
BLOOD UREA NITROGEN: 17 mg/dL (ref 8–27)
BUN / CREAT RATIO: 29 — ABNORMAL HIGH (ref 12–28)
CALCIUM: 9.4 mg/dL (ref 8.7–10.3)
CHLORIDE: 106 mmol/L (ref 96–106)
CO2: 21 mmol/L (ref 20–29)
CREATININE: 0.59 mg/dL (ref 0.57–1.00)
GFR MDRD AF AMER: 114 mL/min/{1.73_m2}
GFR MDRD NON AF AMER: 99 mL/min/{1.73_m2}
GLUCOSE: 95 mg/dL (ref 65–99)
POTASSIUM: 4.9 mmol/L (ref 3.5–5.2)
SODIUM: 142 mmol/L (ref 134–144)

## 2018-03-14 LAB — MAGNESIUM: Lab: 1.7

## 2018-03-14 LAB — CBC W/ DIFFERENTIAL
BANDED NEUTROPHILS ABSOLUTE COUNT: 0 10*3/uL (ref 0.0–0.1)
BASOPHILS ABSOLUTE COUNT: 0 10*3/uL (ref 0.0–0.2)
BASOPHILS RELATIVE PERCENT: 1 %
EOSINOPHILS ABSOLUTE COUNT: 0.2 10*3/uL (ref 0.0–0.4)
EOSINOPHILS RELATIVE PERCENT: 3 %
HEMATOCRIT: 38.1 % (ref 34.0–46.6)
HEMOGLOBIN: 11.2 g/dL (ref 11.1–15.9)
IMMATURE GRANULOCYTES: 0 %
MEAN CORPUSCULAR HEMOGLOBIN CONC: 29.4 g/dL — ABNORMAL LOW (ref 31.5–35.7)
MEAN CORPUSCULAR HEMOGLOBIN: 23.9 pg — ABNORMAL LOW (ref 26.6–33.0)
MEAN CORPUSCULAR VOLUME: 81 fL (ref 79–97)
MONOCYTES ABSOLUTE COUNT: 0.6 10*3/uL (ref 0.1–0.9)
NEUTROPHILS ABSOLUTE COUNT: 2.5 10*3/uL (ref 1.4–7.0)
PLATELET COUNT: 319 10*3/uL (ref 150–379)
RED BLOOD CELL COUNT: 4.69 x10E6/uL (ref 3.77–5.28)
RED CELL DISTRIBUTION WIDTH: 18.4 % — ABNORMAL HIGH (ref 12.3–15.4)
WHITE BLOOD CELL COUNT: 5.5 10*3/uL (ref 3.4–10.8)

## 2018-03-14 LAB — PHOSPHORUS, SERUM: Lab: 2.9

## 2018-03-14 LAB — PLATELET COUNT: Lab: 319

## 2018-03-14 LAB — GAMMA GLUTAMYL TRANSFERASE: Lab: 34

## 2018-03-14 LAB — ALBUMIN: Lab: 4.2

## 2018-03-14 LAB — BILIRUBIN DIRECT: Lab: 0.05

## 2018-03-14 MED FILL — MYFORTIC/180MG/TAB: MYFORTIC/180MG/TAB | 30 days supply | Qty: 120 | Fill #3

## 2018-03-14 MED FILL — ASPIRIN LOW DOSE/81MG EC/TBEC: ASPIRIN LOW DOSE/81MG EC/TBEC | 30 days supply | Qty: 30 | Fill #4

## 2018-03-15 LAB — TACROLIMUS BLOOD: Lab: 6.2

## 2018-03-18 ENCOUNTER — Ambulatory Visit: Admit: 2018-03-18 | Discharge: 2018-03-19 | Payer: MEDICARE | Attending: Clinical | Primary: Clinical

## 2018-03-18 ENCOUNTER — Ambulatory Visit: Admit: 2018-03-18 | Discharge: 2018-03-19 | Payer: MEDICARE | Attending: Registered" | Primary: Registered"

## 2018-03-18 ENCOUNTER — Ambulatory Visit: Admit: 2018-03-18 | Discharge: 2018-03-19 | Payer: MEDICARE

## 2018-03-18 ENCOUNTER — Ambulatory Visit
Admit: 2018-03-18 | Discharge: 2018-03-19 | Payer: MEDICARE | Attending: Transplant Surgery | Primary: Transplant Surgery

## 2018-03-18 ENCOUNTER — Ambulatory Visit
Admit: 2018-03-18 | Discharge: 2018-03-19 | Payer: MEDICARE | Attending: Pharmacist Clinician (PhC)/ Clinical Pharmacy Specialist | Primary: Pharmacist Clinician (PhC)/ Clinical Pharmacy Specialist

## 2018-03-18 DIAGNOSIS — I871 Compression of vein: Secondary | ICD-10-CM | POA: Diagnosis not present

## 2018-03-18 DIAGNOSIS — E559 Vitamin D deficiency, unspecified: Secondary | ICD-10-CM | POA: Diagnosis not present

## 2018-03-18 DIAGNOSIS — Z940 Kidney transplant status: Secondary | ICD-10-CM | POA: Diagnosis not present

## 2018-03-18 DIAGNOSIS — Z944 Liver transplant status: Secondary | ICD-10-CM | POA: Diagnosis not present

## 2018-03-18 DIAGNOSIS — Z6832 Body mass index (BMI) 32.0-32.9, adult: Secondary | ICD-10-CM | POA: Diagnosis not present

## 2018-03-18 DIAGNOSIS — R188 Other ascites: Secondary | ICD-10-CM | POA: Diagnosis not present

## 2018-03-18 DIAGNOSIS — R635 Abnormal weight gain: Secondary | ICD-10-CM | POA: Diagnosis not present

## 2018-03-18 DIAGNOSIS — B259 Cytomegaloviral disease, unspecified: Secondary | ICD-10-CM | POA: Diagnosis not present

## 2018-03-18 DIAGNOSIS — Z48288 Encounter for aftercare following multiple organ transplant: Secondary | ICD-10-CM | POA: Diagnosis not present

## 2018-03-18 DIAGNOSIS — F1011 Alcohol abuse, in remission: Secondary | ICD-10-CM | POA: Diagnosis not present

## 2018-03-18 DIAGNOSIS — D472 Monoclonal gammopathy: Secondary | ICD-10-CM | POA: Diagnosis not present

## 2018-03-18 DIAGNOSIS — Z79899 Other long term (current) drug therapy: Secondary | ICD-10-CM

## 2018-03-18 DIAGNOSIS — T8649 Other complications of liver transplant: Secondary | ICD-10-CM

## 2018-03-18 DIAGNOSIS — Z13220 Encounter for screening for lipoid disorders: Secondary | ICD-10-CM

## 2018-03-18 DIAGNOSIS — K769 Liver disease, unspecified: Secondary | ICD-10-CM

## 2018-03-18 LAB — MUCUS

## 2018-03-18 LAB — LIPID PANEL
CHOLESTEROL/HDL RATIO SCREEN: 5 — ABNORMAL HIGH (ref <5.0)
CHOLESTEROL: 239 mg/dL — ABNORMAL HIGH (ref 100–199)
HDL CHOLESTEROL: 48 mg/dL (ref 40–59)
LDL CHOLESTEROL CALCULATED: 148 mg/dL — ABNORMAL HIGH (ref 60–99)
TRIGLYCERIDES: 213 mg/dL — ABNORMAL HIGH (ref 1–149)
VLDL CHOLESTEROL CAL: 42.6 mg/dL — ABNORMAL HIGH (ref 11–41)

## 2018-03-18 LAB — SMEAR REVIEW

## 2018-03-18 LAB — PROTEIN URINE: Protein:MCnc:Pt:Urine:Qn:: 38.7

## 2018-03-18 LAB — COMPREHENSIVE METABOLIC PANEL
ALBUMIN: 4.4 g/dL (ref 3.5–5.0)
ALKALINE PHOSPHATASE: 71 U/L (ref 38–126)
ALT (SGPT): 20 U/L (ref 15–48)
AST (SGOT): 19 U/L (ref 14–38)
BLOOD UREA NITROGEN: 17 mg/dL (ref 7–21)
BUN / CREAT RATIO: 25
CHLORIDE: 106 mmol/L (ref 98–107)
CO2: 26 mmol/L (ref 22.0–30.0)
CREATININE: 0.69 mg/dL (ref 0.60–1.00)
EGFR MDRD AF AMER: >=60 mL/min/{1.73_m2} (ref >=60)
EGFR MDRD NON AF AMER: >=60 mL/min/{1.73_m2} (ref >=60)
GLUCOSE RANDOM: 106 mg/dL — ABNORMAL HIGH (ref 65–99)
POTASSIUM: 5.4 mmol/L — ABNORMAL HIGH (ref 3.5–5.0)
PROTEIN TOTAL: 7.2 g/dL (ref 6.5–8.3)
SODIUM: 142 mmol/L (ref 135–145)

## 2018-03-18 LAB — CBC W/ AUTO DIFF
BASOPHILS ABSOLUTE COUNT: 0.1 10*9/L (ref 0.0–0.1)
BASOPHILS RELATIVE PERCENT: 0.9 %
EOSINOPHILS ABSOLUTE COUNT: 0.1 10*9/L (ref 0.0–0.4)
EOSINOPHILS RELATIVE PERCENT: 0.9 %
HEMATOCRIT: 37.7 % (ref 36.0–46.0)
HEMOGLOBIN: 11.3 g/dL — ABNORMAL LOW (ref 12.0–16.0)
LYMPHOCYTES ABSOLUTE COUNT: 2 10*9/L (ref 1.5–5.0)
LYMPHOCYTES RELATIVE PERCENT: 37.4 %
MEAN CORPUSCULAR HEMOGLOBIN: 24.8 pg — ABNORMAL LOW (ref 26.0–34.0)
MEAN CORPUSCULAR VOLUME: 82.8 fL (ref 80.0–100.0)
MEAN PLATELET VOLUME: 8.4 fL (ref 7.0–10.0)
MONOCYTES ABSOLUTE COUNT: 0.3 10*9/L (ref 0.2–0.8)
MONOCYTES RELATIVE PERCENT: 5.8 %
NEUTROPHILS ABSOLUTE COUNT: 2.8 10*9/L (ref 2.0–7.5)
NEUTROPHILS RELATIVE PERCENT: 52.6 %
PLATELET COUNT: 344 10*9/L (ref 150–440)
RED BLOOD CELL COUNT: 4.55 10*12/L (ref 4.00–5.20)
RED CELL DISTRIBUTION WIDTH: 15.9 % — ABNORMAL HIGH (ref 12.0–15.0)
WBC ADJUSTED: 5.3 10*9/L (ref 4.5–11.0)

## 2018-03-18 LAB — URINALYSIS
BILIRUBIN UA: NEGATIVE
BLOOD UA: NEGATIVE
GLUCOSE UA: NEGATIVE
KETONES UA: NEGATIVE
LEUKOCYTE ESTERASE UA: NEGATIVE
NITRITE UA: NEGATIVE
PROTEIN UA: 30 — AB
RBC UA: 1 /HPF (ref <=4)
SPECIFIC GRAVITY UA: 1.007 (ref 1.003–1.030)
SQUAMOUS EPITHELIAL: <1 /HPF (ref 0–5)
UROBILINOGEN UA: 0.2
WBC UA: 1 /HPF (ref 0–5)

## 2018-03-18 LAB — GAMMA GLUTAMYL TRANSFERASE: Gamma glutamyl transferase:CCnc:Pt:Ser/Plas:Qn:: 50 — ABNORMAL HIGH

## 2018-03-18 LAB — PHOSPHORUS: Phosphate:MCnc:Pt:Ser/Plas:Qn:: 2.9

## 2018-03-18 LAB — PROTEIN / CREATININE RATIO, URINE
PROTEIN URINE: 38.7 mg/dL
PROTEIN/CREAT RATIO, URINE: 1.512

## 2018-03-18 LAB — T3 FREE: Triiodothyronine.free:MCnc:Pt:Ser/Plas:Qn:: 3.17

## 2018-03-18 LAB — CHOLESTEROL/HDL RATIO SCREEN: Lab: 5 — ABNORMAL HIGH

## 2018-03-18 LAB — BILIRUBIN DIRECT: Bilirubin.glucuronidated:MCnc:Pt:Ser/Plas:Qn:: <0.1

## 2018-03-18 LAB — NEUTROPHILS RELATIVE PERCENT: Lab: 52.6

## 2018-03-18 LAB — FREE T4: Thyroxine.free:MCnc:Pt:Ser/Plas:Qn:: 0.93

## 2018-03-18 LAB — THYROID STIMULATING HORMONE: Thyrotropin:ACnc:Pt:Ser/Plas:Qn:: 0.709

## 2018-03-18 LAB — SODIUM: Sodium:SCnc:Pt:Ser/Plas:Qn:: 142

## 2018-03-18 LAB — TSH: THYROID STIMULATING HORMONE: 0.709 u[IU]/mL (ref 0.600–3.300)

## 2018-03-18 LAB — MAGNESIUM: Magnesium:MCnc:Pt:Ser/Plas:Qn:: 1.7

## 2018-03-18 NOTE — Unmapped (Signed)
CONFIDENTIAL PSYCHOLOGICAL NOTE/FOLLOW-UP    Patient Name: Michelle Travis  Medical Record Number: [id number]  Date of Service: Mar 18, 2018  Attending Psychologist: Venia Carbon, Psy.D.  Postdoc/Intern: None Present  Evaluation Duration and Procedures:  Clinical interview; record review; case consultation; Psychological testing (DSM-5 Cross-Cutting Symptom Measure, PROMIS Emotional Distress Anxiety, and PROMIS Emotional Distress Depression, PROMIS Emotional Distress Anger).   Procedure Code(s): [id number] (psychiatric diagnostic interview), [phone] (psychological or neuropsychological test administration and scoring, first 30 minutes), [phone] (psychological testing evaluation services, including integration of patient data, interpretation of standardized test results and clinical data, clinical decision making, treatment planning, and report and interactive feedback to the patient/family/caregivers, first hour)??    This evaluation note may contain sensitive and confidential information regarding the patient???s psychosocial adjustment to living with a chronic medical condition. DO NOT share this information outside [facility] without written consent from the patient explicitly stating that mental health records may be released.     The limits of confidentiality and the purpose of the evaluation were reviewed. The patient was provided with a verbal description of the nature and purpose of the psychological evaluation. I also reviewed the referral source, specific referral question for this evaluation, foreseeable risks/discomforts, benefits, limits of confidentiality, and mandatory reporting requirements of this provider. The patient was given the opportunity to ask questions and receive answers about the present evaluation. Oral consent was provided by the patient.     BACKGROUND INFORMATION/RECORD REVIEW:    Ms.  Travis is a very pleasant 62 y.o.  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  She lost her husband immediately preceding transplant, but appeared to be coping well in November 2018.    She is being seen today for follow up assessment of psychological functioning following transplant.       BEHAVIORAL OBSERVATIONS:  Michelle Travis arrived for her appointment  on time.  She was interviewed with her daughter, Michelle Travis, present.  Rapport was easily established.  She did not seem motivated to present  herself in an overly favorable light    MENTAL STATUS EXAM:  Appearance: Clean/Neat; Well Groomed  Motor: Ambulating with Cane  Speech/Language: Normal rate, volume, tone, fluency  Mood: Happy.Marland KitchenMarland KitchenBlessed  Affect: Full and Mood congruent  Thought Process: Logical, linear, clear, coherent, goal directed  Thought Content: Denies SI, HI, self harm, delusions, obsessions, paranoid ideation, or ideas of reference  Perceptual Disturbances: Denies auditory and visual hallucinations, behavior not concerning for response to internal stimuli  Orientation: Oriented to person, place, time, and general circumstances  Attention: Able to fully attend without fluctuations in consciousness  Concentration: Able to fully concentrate and attend  Memory: Immediate, short-term, long-term, and recall grossly intact  Fund of Knowledge: Consistent with level of education and development  Insight: Intact  Judgment: Intact  Impulse Control: Intact    HEALTH/MEDICAL:  Interval Changes: Michelle Travis described a relatively smooth progression of healing post-transplant  Current Symptoms: Some difficulty walking long distances, Weight gain  Pain (0=no pain; 10=worst pain imaginable): Not Quantified  Pain Medications:  use them as prescribed  Medications:   Current Outpatient Medications   Medication Sig Dispense Refill   ??? acetaminophen (TYLENOL) 325 MG tablet Take 1-2 tablets (325-650 mg total) by mouth every six (6) hours as needed for pain. 100 tablet 0   ??? aspirin (ECOTRIN) 81 MG tablet Take 1 tablet (81 mg total) by mouth daily. 30 tablet 11 ??? cholecalciferol, vitamin D3, (CHOLECALCIFEROL) 1,000 unit tablet Take 2 tablets (2,000  Units total) by mouth daily. 60 tablet 11   ??? ENVARSUS XR 1 mg Tb24 extended release tablet 10 mg daily (two 4 mg tablets and two 1 mg tablets) 60 tablet 11   ??? ENVARSUS XR 4 mg Tb24 extended release tablet 10 mg daily (two 4 mg tablets and two 1 mg tablets) 60 tablet 11   ??? MYFORTIC 180 mg EC tablet Take 1 tablet (180 mg total) by mouth Two (2) times a day. 60 tablet 11   ??? pantoprazole (PROTONIX) 40 MG tablet Take 1 tablet (40 mg total) by mouth daily as needed. 30 tablet PRN   ??? predniSONE (DELTASONE) 5 MG tablet Take 5 mg by mouth daily.     ??? sulfamethoxazole-trimethoprim (BACTRIM,SEPTRA) 400-80 mg per tablet Take 1 tablet (80 mg of trimethoprim total) by mouth 3 (three) times a week. 12 tablet 3   ??? tbo-filgrastim (GRANIX) 300 mcg/0.5 mL Syrg injection Take directed 3 Syringe 0     No current facility-administered medications for this visit.        ADHERENCE ISSUES:  Diet Adherence: Good; pt. Reports approximately 18 lb weight gain since transplant   Medication Management: Michelle Travis manages her own pillbox.  Michelle Travis continues to assist with calling in medications for refills and picking up Travis prescriptions  Medication Adherence: Good  Medication Concerns: denied problems taking medications, concerns about side effects, affordability, problems obtaining medications, and difficulty remembering medications  Attendance to appointments: Excellent     Health Literacy Estimation:  Good     Dialysis: N/A  Diabetes: N/A  Sleep Apnea: Denied, though she has been prescribed CPAP in the past    SUBSTANCE USE ISSUES:   Nicotine/Tobacco: No Use  Interval alcohol use: Denied any use since Transplant, but asked if she could drink alcohol again  Substance abuse/relapse prevention treatment: Michelle Travis was referred to outpatient SA Counseling due to longstanding history of alcohol abuse and 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  Her counselor, Michelle Travis, at [facility] [phone]) stated that Michelle Travis completed 6 months of SA counseling and Toxicology Screens.  Michelle Travis stated that the pt. Engaged very well in treatment.  She was open and honest throughout her sessions and spoke openly about the negative effects alcohol has had on her life and health.   Illicit drug use: Denied  -If yes type: N/A  Licit substance abuse or misuse: Denied  Alcohol or drug-related legal problems: Yes; H/O DUI  Relapse risk: moderate    TRANSPLANT ISSUES:  Interval changes since last evaluation: Denied    MENTAL HEALTH:   Mental health changes since last evaluation: Denied  Mood: Happy...blessed  Depressive Disorder Symptoms: none endorsed  Bipolar Disorder: Denied  Cognitive Symptoms: Denied    Anxiety:  Worry: Denied  GAD: Denied  Panic: no  Agoraphobia: Denied  Phobias: Denied    PTSD: Denied  OCD: no  Other DSM-V Diagnoses: Alcohol Use Disorder, in Sustained Remission  Coping Style: Functional; Faith, Family, Prayer    SOCIAL ISSUES:   Interval changes since last evaluation: Ms. Storck husband, Michelle Travis, died prior to her Transplant.  She currently lives alone and appears to be copoing well.     INTERVENTION:  Psychological Evaluation    PSYCHOLOGICAL TESTING:  Michelle Travis completed the DSM-5 Cross-Cutting Symptom Measure, PROMIS Emotional Distress Anxiety, and PROMIS Emotional Distress Depression, and PROMIS Anger. The Cross-Cutting Measure assesses mental health domains that may impact treatment and prognosis; it assesses depression, anger, mania,  somatic symptoms, suicidal ideation, psychosis, sleep problems, memory, repetitive thoughts and behaviors, dissociation, personality functioning, and substance use. On the Cross-Cutting Symptom Measure, she reported severe symptoms (nearly every day of the past two weeks) in these domains: NONE. She reported moderate symptoms (more than half the days of the past two weeks) in these domains: NONE. She reported mild symptoms (several days in the past two weeks) in these domains: NONE. *She had no endorsements on this measure.     The PROMIS Depression measures depressive symptoms on a 5-point scale, with higher scores indicating a greater severity of depression. Her level of depression was within normal limits; T=37.1.     The PROMIS Anxiety measures anxiety symptoms on a 5-point scale, with higher scores indicating a greater severity of anxiety. Her level of anxiety was within normal limits; T=36.3.     The PROMIS Anger measures symptoms of anger on a 5-point scale, with higher scores indicating a greater severity of anger. Her level of anger was within normal limits; T=32.9.      No data recorded    No data recorded    PSYCHIATRIC DIAGNOSIS:  Alcohol abuse disorder, in sustained remission                               COLLATERAL INFORMATION:  Michelle Travis daughter, Michelle Travis, was present for today's evaluation and corroborated the patient's report.     IMPRESSIONS AND RECOMMENDATIONS:     Michelle Travis was seen today for pre-transplant follow up. She reported excellent adherence to her medical directives, stating she has not missed any medications or appointments.  She described a stable psychological functioning and denied any symptoms of depression, anxiety, or other mental health symptoms. She reported that her family has been exceptionally supportive during this time.    Michelle Travis denied substance (nicotine, alcohol, illicit substances) use since Transplant; however, she did ask if she was allowed to drink alcohol at this time.  I informed her that in light of her history of heavy alcohol use, DUI, and alcohol-induced cirrhosis, I would strongly discourage any use of alcohol.  She reported understanding and agreement with this plan.  Michelle Travis daughter also reported agreement with this plan. Overall,  Her psychological functioning appears good and has good coping skills.      Michelle Travis needs NO FURTHER follow up with Transplant Psychology at this time.  Should the patient or treatment team notice a change in functioning, please refer back to transplant psychology for further evaluation and treatment.       Recommendations discussed with patient?  yes    Agreed upon by patient?  yes     Ms.  Travis was given this writer's business card with confidential voice mail number and instructed to call 911 for emergencies.

## 2018-03-18 NOTE — Unmapped (Signed)
Per PharmD Mincemoyer patient to get lipid panel checked.  No need for drug level today patient will obtain labs next week with drug level.  Per Dr. Raelene Bott patient needs to have TSH, free T4 and free T3 checked.  Additionally set up same day liver US to assess for potential ascites and portal vein stenosis.  See providers notes for details. Michelle Travis

## 2018-03-18 NOTE — Unmapped (Signed)
[facility] HOSPITALS TRANSPLANT CLINIC PHARMACY NOTE  03/18/2018   Michelle Travis  [id number]    Medication changes today:   1. Change pantoprazole to PRN    Education/Adherence tools provided today:  1.provided updated medication list   2. Provided additional education on immunosuppression and transplant related medications    Follow up items:  1. Consider starting moderate intensity statin at next visit  2. Follow up tolerability of pantoprazole PRN at next visit   3. Follow up BP in 2 weeks   4. As patient approaches the 1 year transplant anniversary could consider early weaning of pred due to significant weight gain     Next visit with pharmacy in 1-3 months  ____________________________________________________________________    Michelle Travis is a 62 y.o. 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     Post-op course complicated by DGF requiring HD from 09/14/17-09/24/17.    Other PMH significant for HTN, h/o breast cancer s/p lumpectomy (2000)    Seen by pharmacy today for: medication management ; last seen by pharmacy 4 weeks ago     CC:  Patient has no complaints today     Vitals:    03/18/18 0901   BP: 146/87   Pulse: 63   Temp: 36.5 ??C (97.7 ??F)       Allergies   Allergen Reactions   ??? Latex Swelling and Rash     Discoloration of skin.   Discoloration of skin.    ??? Hydrocodone Itching   ??? Hydrocodone-Acetaminophen Itching       All medications reviewed and updated.     Medication list includes revisions made during today???s encounter    Outpatient Encounter Medications as of 03/18/2018   Medication Sig Dispense Refill   ??? acetaminophen (TYLENOL) 325 MG tablet Take 1-2 tablets (325-650 mg total) by mouth every six (6) hours as needed for pain. 100 tablet 0   ??? aspirin (ECOTRIN) 81 MG tablet Take 1 tablet (81 mg total) by mouth daily. 30 tablet 11   ??? cholecalciferol, vitamin D3, (CHOLECALCIFEROL) 1,000 unit tablet Take 2 tablets (2,000 Units total) by mouth daily. 60 tablet 11   ??? ENVARSUS XR 1 mg Tb24 extended release tablet 10 mg daily (two 4 mg tablets and two 1 mg tablets) 60 tablet 11   ??? ENVARSUS XR 4 mg Tb24 extended release tablet 10 mg daily (two 4 mg tablets and two 1 mg tablets) 60 tablet 11   ??? MYFORTIC 180 mg EC tablet Take 1 tablet (180 mg total) by mouth Two (2) times a day. 60 tablet 11   ??? pantoprazole (PROTONIX) 40 MG tablet Take 1 tablet (40 mg total) by mouth daily as needed. 30 tablet PRN   ??? predniSONE (DELTASONE) 5 MG tablet Take 5 mg by mouth daily.     ??? sulfamethoxazole-trimethoprim (BACTRIM,SEPTRA) 400-80 mg per tablet Take 1 tablet (80 mg of trimethoprim total) by mouth 3 (three) times a week. 12 tablet 3   ??? tbo-filgrastim (GRANIX) 300 mcg/0.5 mL Syrg injection Take directed 3 Syringe 0     No facility-administered encounter medications on file as of 03/18/2018.        Induction agent : basiliximab    CURRENT IMMUNOSUPPRESSION: Envarsus 10 mg PO daily (when initially transitioned to Envarsus had only take 2 of the 1 mg tablets and had mistakenly omitted the 4 mg tablets)  Prograf goal: 6-8   myfortic 180 mg BID (  restarted in February 2018 as it had been held for leukopenia)   prednisone 5 mg PO daily for 1 year per liver/kidney protocol    Patient's states HA have improved with change to Envarsus but are still present.  Notices large appetite with prednisone    IMMUNOSUPPRESSION DRUG LEVELS:  Lab Results   Component Value Date    TACROLIMUS 6.2 03/13/2018    TACROLIMUS 1.3 (L) 03/06/2018    TACROLIMUS 4.8 02/27/2018    TACROLIMUS 6.6 12/17/2017    TACROLIMUS 4.7 10/17/2017    TACROLIMUS 8.8 09/25/2017     No results found for: CYCLO  No results found for: EVEROLIMUS  No results found for: SIROLIMUS    Envarsus level was not drawn today       Graft function: stable  DSA: ntd  Biopsies to date: 09/13/17 - liver zero-hour biopsy unremarkable  WBC/ANC:  5.3/2.8    Plan: Will maintain current immunosuppression. Continue to monitor.    ID prophylaxis:   CMV Status: D+/ R+, moderate risk . CMV prophylaxis: valganciclovir 450 mg daily x 3 months per protocol - completed  Lab Results   Component Value Date    CMVCP Negative 11/28/2017   Serum creatinine: 0.59 mg/dL 16/10/96 0454  Estimated creatinine clearance: 101.4 mL/min  PCP Prophylaxis: bactrim SS 1 tab MWF x 6 months complete  Thrush:  completed in hospital  Patient is  tolerating infectious prophylaxis well    Plan: Completed per protocol. Continue to monitor.    CV Prophylaxis: asa 81 mg   The 10-year ASCVD risk score Denman George DC Jr., et al., 2013) is: 10.4%  Statin therapy: Indicated; currently on none  Plan: Consider starting moderate intensity statin at next visit. Continue to monitor     BP: Goal < 140/90. Clinic vitals reported above  Home BP ranges: Does not regularly at home  Current meds include: none  Plan: BP at clinic today was elevated.  Patient will check BP a 3-4 days per week and let coordinator know if systolic BP consistently >140 Continue to monitor    Anemia:  H/H:   Lab Results   Component Value Date    HGB 11.2 03/13/2018     Lab Results   Component Value Date    HCT 38.1 03/13/2018     Iron panel:  Lab Results   Component Value Date    IRON 142 06/12/2017    TIBC 325.6 06/12/2017    FERRITIN 83.5 06/12/2017     Lab Results   Component Value Date    LABIRON 44 06/12/2017   Prior ESA use: none documented post-transplant in [facility] system  Plan: stable. Continue to monitor.     DM:   Lab Results   Component Value Date    A1C 4.4 (L) 09/12/2017   . Goal A1c < 7  History of Dm? No   Diet: Reports increased appetite on prednisone and weight gain of 20 lb since February. Met with transplant dietitian today who suggested to stop eating in the middle of the night, have a nutritious bedtime snack instead of chips, and increase physical activity.  Established with endocrinologist/PCP for BG managment? No  Plan: Incorporate diet/exercise recommendations per dietititan. Continue to monitor    Electrolytes: wnl  Meds currently on: none  Plan: Continue to monitor     GI/BM: Pt reports 1 BM per day, but usually has 2-3 per day.   Meds currently on: pantoprazole 40 mg PO daily  Plan:  Continue to  monitor    Pain: pt reports HA a couple times per week. HA have improved to some extent with change to Envarsus  Meds currently on: APAP PRN for HA  Plan: Continue to monitor    Bone health:   Vitamin D Level: pending. Goal > 30.   Last DEXA results:  06/14/16: The femoral neck density indicates osteopenia, but the spine and total femoral densities are normal.  Current meds include: Cholecalciferol 2,000 units once daily   Plan: Vitamin D level pending. Continue to monitor.     Women's/Men's Health:  Michelle Travis is a 62 y.o. Female perimenopausal. Patient reports no men's/women's health issues  Plan: Continue to monitor    Adherence: Patient has average understanding of medications; was not able to independently identify names/doses of immunosuppressants and OI meds.  Patient  has been filling her own pill box for the past few weeks as daughter has been busy  Patient brought medication card:yes  Pill box:did not bring  Plan: Encouraged patient to continue to fill pill box on her own and continue to familiarize herself with her medications.  Provided basic adherence counseling/intervention    Spent approximately 30 minutes on educating this patient and greater than 50% was spent in direct face to face counseling regarding post transplant medication education. Questions and concerns were address to patient's satisfaction.    Patient was reviewed with Dr. Raelene Bott who was agreement with the stated plan:     During this visit, the following was completed:   BG log data assessment  BP log data assessment  Labs ordered and evaluated  complex treatment plan >1 DS   Patient education was completed for 11-24 minutes     All questions/concerns were addressed to the patient's satisfaction.    __________________________________________  Cleone Slim, PHARMD  [employer] TRANSPLANT  PAGER [phone]

## 2018-03-18 NOTE — Unmapped (Signed)
Addended by: Mariea Stable A on: 03/18/2018 11:03 AM     Modules accepted: Orders, SmartSet

## 2018-03-18 NOTE — Unmapped (Signed)
SLEEP HYGIENE INSTRUCTIONS    --Sleep only as much as you need to feel refreshed the following day. Restricting your time in bed helps to consolidate and deepen your sleep. Excessively long times in bed may lead to fragmented and shallow sleep. Get up at your regular time the next day, no matter how little you slept.    --Get up at the same time each day, 7 days a week. A regular wake time in the morning leads to regular times of sleep onset, and helps to set your biological clock.    --Exercise regularly. Schedule vigorous exercise times so that they do not occur within 3 hours of when you intend to go to bed. Exercise makes it easier to initiate sleep and deepen sleep. Relaxing exercise, like yoga, can be done before bed to promote restful sleep.      --Don't take your problems to bed. Plan some time earlier in the evening for working on your problems or planning the next day's activities. Worrying may interfere with initiating sleep and may produce shallow sleep.     --Train yourself to use the bedroom only for sleep and sexual activity. This will help condition your brain to see the bed as the place for sleeping. Do not read, watch TV, or eat in bed.    --Do not try and fall asleep. This only makes the problem worse. If you cannot sleep within 10-15 minutes, turn on the light, leave the bedroom, and do something different (and relaxing) like reading a book. Don't engage in stimulating activity. Return to bed only when you feel sleepy. Your goal is to break the habit of associating the bed with worry or anxiety.     --Avoid long naps. Staying awake during the day helps to fall asleep at night. Naps totaling more than 30 minutes increase your chances of having trouble sleeping at night.    --Make sure that your bedroom is at a comfortable temperature during the night. Excessively warm (above 75 degrees) or cold (below 55 degrees) sleep environments may disturb sleep.     --Eat regular meals and do not go to bed hungry. Hunger may disturb sleep. A light snack at bedtime (especially carbohydrates and dairy products) may help sleep, but avoid greasy foods or heavy foods.    --Avoid excessive liquids in the evening. Reducing liquid intake will minimize the need for nighttime trips to the bathroom.    --Cut down on all caffeine products. Caffeinated beverages and food (coffee, tea, cola, chocolate) can cause difficulty falling asleep, awakenings during the night, and shallow sleep. Even caffeine early in the day can disrupt nighttime sleep. If you drink caffeine, make sure you do not have any 4-6 hours before bedtime.     --Avoid alcohol, especially in the evening. Although alcohol can help people fall asleep more easily, it causes awakenings later in the night as well as shallow sleep. When you metabolize alcohol, it causes a withdrawal syndrome at night that can lead to nightmare and sweats.     --Smoking may disturb sleep. Nicotine is a stimulant. Try not to smoke during the night when you have trouble sleeping.    --Make sure your bedroom is dark, quiet, and relaxing.     --If you get up at night, use a nightlight for lighting. Do not turn on bright lights.     --Exposure yourself to natural light regularly. This helps maintain a healthy sleep-wake cycle.     --Do not use a smartphone,  tablet, laptop before bed. Do not watch TV, especially LED TVs, before bed. Specific wavelengths of light coming from these devices can suppress melatonin, a hormone that helps regulate your sleep cycle. Any kind of light exposure will also suppress melatonin, so try to gradually lower the lights in the house before bed. If you cannot avoid computer use before bed, reduce the screen's blue wavelength light.     --If your pet sleeps with you, this may cause awakenings at night from their movements on the bed, or from allergies.

## 2018-03-18 NOTE — Unmapped (Signed)
Outpatient Adult Nutrition - Transplant Follow-up     Reason for Visit: s/p liver/kidney transplant 09/13/17    PMH:   Patient Active Problem List   Diagnosis   ??? Hypertension (RAF-HCC)   ??? Chronic back pain   ??? History of breast cancer 2000 s/p lumpectomy   ??? Sleep apnea   ??? Membranous Nephropathy with segmental scarring   ??? Rectus sheath hematoma   ??? Liver replaced by transplant (CMS-HCC)   ??? Kidney transplanted   ??? Aftercare following organ transplant     Anthropometric Data:  -- Height: 160 cm (5' 2.99)   -- Weight:  Wt Readings from Last 1 Encounters:   03/18/18 83.9 kg (185 lb)   -- IBW: 52.21 kg  -- Percent IBW: ~161%  -- AdjIBW: 60.1 kg  -- BMI: Body mass index is 32.78 kg/m??.; obese    -- Weight history: pt has been gaining weight - she says it's not just me - something has got me eating more. +23 lbs over the past ~3 months  Wt Readings from Last 10 Encounters:   03/18/18 83.9 kg (185 lb)   03/18/18 83.9 kg (185 lb)   03/18/18 83.9 kg (185 lb)   12/17/17 73.5 kg (162 lb)   12/17/17 73.7 kg (162 lb 7.7 oz)   12/17/17 73.7 kg (162 lb 6.4 oz)   11/01/17 66.6 kg (146 lb 13.2 oz)   10/17/17 66.7 kg (147 lb)   10/17/17 67 kg (147 lb 12.8 oz)   10/17/17 67 kg (147 lb 12.8 oz)     Nutrition Focused Physical Exam:                        Nutrition Evaluation  Overall Impressions: Nutrition-Focused Physical Exam not indicated due to lack of malnutrition risk factors. (03/19/18 1104)  Nutrition Designation: Obese class I  (BMI 30.00 - 34.99 kg/m2) (03/19/18 1104)    Relevant Medications, Herbs, Supplements include: Vit D3, envarsus, myfortic, protonix, prednisone    Relevant Labs: reviewed    Physical Activity: minimal to no scheduled exercise. Pt participates in ADLS, walks to the mailbox etc but reportedly cannot exercise 2/2 back pain and knee problems. Several suggestions were provided although pt was very resistant.    Dietary Restrictions: nuts and small seeds    Allergies:   Allergies   Allergen Reactions ??? Latex Swelling and Rash     Discoloration of skin.   Discoloration of skin.    ??? Hydrocodone Itching   ??? Hydrocodone-Acetaminophen Itching     Hunger and Satiety: ignoring satiety signals    Usual Intake:   1st - egg white, 1 hot dog, light toast, water; bacon w/ egg white, toast; egg white, pancake, bacon   Snack - n/a  2nd - skips  Snack - n/a  3rd - 6pm - pork chop, macaroni salad, potato salad, water; bagged salad w/ onions, sometimes w/ baked chicken breast    Snack - 11pm - a napkin full of chips  Snacks - 2-3am: chips, PB and crackers   Beverages - water    Late night eating - wakes up hungry  Needs to incorporate cardio + strength training in upper body  Craving potato chips, chip hot fries- early in the AM (3am)    Nutrition History/Assessment: pt endorses ongoing increases in appetite & intake, which she associates with prednisone. Significant weight gain over the past ~3 months. Reportedly interested in losing weight/halting further gains but very resistant  to dietary advice/suggestion throughout appointment.     Pt often skips lunch, which lends to extreme feelings of hunger and mindless eating in the evening/early morning hours. She often keeps chips on her nightstand and eats a napkin full prior to bed and again at 2-3am. Attempted to work with pt to identify hunger vs craving; she did not participate. Encouraged her to consume a nutritionally dense snack prior to bed that would promote satiety (ie apple/crackers/celery/carrots with PB, fruit w/ low sodium cheese stick) and to drink water as opposed to eating in the middle of the night. Pts often confuse thirst for hunger. Discussed eating every ~4-5 hrs to avoid feelings of extreme hunger and subsequent overeating.    Pt is not consuming egg yolks but often has egg white w/ processed meats higher in saturated fat. Encouraged her to consume regular eggs w/o bacon or hot dog.    Social: accompanied by daughter, who remains very supportive Estimated Daily Nutritional Needs:   Estimated Energy Needs: [phone] kcal/day (22-25 kcal/kg adjusted BW)  Estimated Protein Needs: 69-86 g pro/day (1.2-1.5 g/kg adjusted BW)  Estimated Fluid Needs: per MD    Malnutrition Assessment using AND/ASPEN Clinical Characteristics:  Patient does not meet AND/ASPEN criteria for malnutrition at this time (03/19/18 1104)                      Education: weight loss towards UBW    Nutrition Goals:  Sustainable weight change of  0.5-1 lb loss/wk towards UBW  Meet nutritional needs   Reduce long-term health risk  Start exercise regimen    Interventions:   1. Eat 3 meals daily with appropriate protein  2. Increase physical activity - upper body/core & chair exercises 3x weekly  3. Decrease intake of salty chips and sugary candies   4. Eliminate 2am chips; drink water instead  5. Have a nutritious snack before bed instead of chips    Materials Provided were:  Tips and suggestions - verbal  RD Contact Info    Follow-up: at 9 month visit    Length of visit was: 20 minutes    Jackqulyn Livings MPH, RD, LDN  Pager: [phone]

## 2018-03-18 NOTE — Unmapped (Signed)
TRANSPLANT SURGERY PROGRESS NOTE    Assessment and Plan  Michelle Travis is a 62 y.o. female with history of EtOH cirrhosis, ESRD on HD, and IgG monoclonal gammopathy, s/p liver-kidney transplant 09/13/2017, who presents for 6 month post-transplant follow-up.  ??  She is clinically doing very well. CBC WNL. Tac trough last week was low at 1.3; dose was increased and trough at 6.2 on 5/15. Weight gain is concerning for Cushing's from prednisone or ascites from previous portal vein stricture or hypothyroidism.   ??  - Check T3, T4, and liver transplant [place] to assess for reason for weight gain ([place] can be done at OSH)   - Continue Myfortic 180mg  BID  - Continue Prednisone 5mg  PO daily for now - pharmacy will speak with nephrology tomorrow to see if this is necessary to continue as it may be contributing to weight gain   - Continue Envarsus- adjust according to trough with goal 6-8  - RTC in 3 months for 9 month post-op  - Lab frequency: weekly    Subjective  Michelle Travis is a 62 year old lady who is 6 month post-op for liver/kidney transplant. She has been doing well since her last visit with [place] 3 months ago. She denies N/V, abdominal pain, dysuria, or medication side effects. Her only complaint today is weight gain, as she has put on around 30 pounds since her surgery. She states her appetite is really big and she is constantly eating. She does not believe this feels the same as when she was volume-up with kidney failure.       Objective    Vitals:    03/18/18 0859   BP: 146/87   Pulse: 63   Temp: 36.5 ??C (97.7 ??F)   TempSrc: Tympanic   Weight: 83.9 kg (185 lb)   Height: 160 cm (5' 2.99)      Body mass index is 32.78 kg/m??.     Physical Exam:    General Appearance:   No acute distress  Lungs:                Clear to auscultation bilaterally  Heart:                           Regular rate and rhythm  Abdomen:                Soft, non-tender, non-distended  Extremities:              Warm and well perfused      Data Review: All lab results last 24 hours:  No results found for this or any previous visit (from the past 24 hour(s)).

## 2018-03-18 NOTE — Unmapped (Signed)
Addended by: Mariea Stable A on: 03/18/2018 01:17 PM     Modules accepted: Orders

## 2018-03-19 LAB — CMV VIRAL LD: Lab: DETECTED — AB

## 2018-03-19 LAB — CMV DNA, QUANTITATIVE, PCR

## 2018-03-19 NOTE — Unmapped (Addendum)
Patient call concerned about her testing from yesterday.  Let her know thyroid testing was normal. That her [place] results are back and that it looks ok to me but I am waiting for the surgeon to review it still but I might not here from him until Wednesday.     Received message from Dr. Raelene Bott confirming [place] is fine but patient needs to come for 98 month follow up.  Made apt. With pharm surgery an nd nutrition.  Spoke with daughter to make aware.  Specifically educated about increase risk of fat deposits in liver with weight gain after transplant.

## 2018-03-20 LAB — TACROLIMUS BLOOD: Lab: 24.3

## 2018-03-20 LAB — VITAMIN D 25 HYDROXY: VITAMIN D, TOTAL (25OH): 22.9 ng/mL (ref 20.0–80.0)

## 2018-03-20 LAB — TACROLIMUS LEVEL: TACROLIMUS BLOOD: 24.3 ng/mL

## 2018-03-20 LAB — VITAMIN D, TOTAL (25OH): Lab: 22.9

## 2018-03-20 NOTE — Unmapped (Addendum)
Patient lab drawn after medication was taken.  Reviewed risk of fat being deposited in liver after transplant with weight gain.  She confirmed she has already changed her diet and stopped eating chips.

## 2018-03-20 NOTE — Unmapped (Signed)
Reviewed CMV results and lipid panel with pharm Mincemoyer no intervention at this time.

## 2018-03-22 LAB — BK BLOOD COMMENT: Lab: 0

## 2018-03-22 LAB — BK VIRUS QUANTITATIVE PCR, BLOOD: BK BLOOD RESULT: NOT DETECTED

## 2018-03-24 NOTE — Unmapped (Unsigned)
Received call from patient daughter reporting since patient was seen last week she has expierenced diarrhea all night. No new medication or fevers. Able to drink and eat still. Patient is still alert and oriented x4. Asked if any changes to diet. Patient has only been eat salad. Advised this is most likely causing the diarrhea. Advised to try the BRAT diet for the day and then slowly introduce normal food again. Advised that saldas are a healthy choice but a lot of ruffage to only eat that. Advised to have a variety of foods to prevent the night time diarrhea. Daughter verbalized understanding. Agreed to call back if this dietary change does not improve the diarrhea.

## 2018-03-27 DIAGNOSIS — Z940 Kidney transplant status: Secondary | ICD-10-CM | POA: Diagnosis not present

## 2018-03-27 DIAGNOSIS — Z944 Liver transplant status: Secondary | ICD-10-CM | POA: Diagnosis not present

## 2018-03-28 LAB — CBC W/ DIFFERENTIAL
BANDED NEUTROPHILS ABSOLUTE COUNT: 0 10*3/uL (ref 0.0–0.1)
BASOPHILS ABSOLUTE COUNT: 0 10*3/uL (ref 0.0–0.2)
BASOPHILS RELATIVE PERCENT: 0 %
EOSINOPHILS ABSOLUTE COUNT: 0.1 10*3/uL (ref 0.0–0.4)
EOSINOPHILS RELATIVE PERCENT: 2 %
HEMOGLOBIN: 11 g/dL — ABNORMAL LOW (ref 11.1–15.9)
IMMATURE GRANULOCYTES: 0 %
LYMPHOCYTES ABSOLUTE COUNT: 1.9 10*3/uL (ref 0.7–3.1)
LYMPHOCYTES RELATIVE PERCENT: 38 %
MEAN CORPUSCULAR HEMOGLOBIN: 24.7 pg — ABNORMAL LOW (ref 26.6–33.0)
MONOCYTES ABSOLUTE COUNT: 0.5 10*3/uL (ref 0.1–0.9)
MONOCYTES RELATIVE PERCENT: 10 %
NEUTROPHILS ABSOLUTE COUNT: 2.5 10*3/uL (ref 1.4–7.0)
NEUTROPHILS RELATIVE PERCENT: 50 %
PLATELET COUNT: 322 10*3/uL (ref 150–450)
RED BLOOD CELL COUNT: 4.45 x10E6/uL (ref 3.77–5.28)
RED CELL DISTRIBUTION WIDTH: 18 % — ABNORMAL HIGH (ref 12.3–15.4)
WHITE BLOOD CELL COUNT: 4.9 10*3/uL (ref 3.4–10.8)

## 2018-03-28 LAB — PHOSPHORUS, SERUM: Lab: 2.6

## 2018-03-28 LAB — HLA DS POST TRANSPLANT
ANTI-DONOR HLA-A #1 MFI: 28 MFI
ANTI-DONOR HLA-A #2 MFI: 60 MFI
ANTI-DONOR HLA-B #1 MFI: 33 MFI
ANTI-DONOR HLA-B #2 MFI: 62 MFI
ANTI-DONOR HLA-C #1 MFI: 4 MFI
ANTI-DONOR HLA-C #2 MFI: 25 MFI
ANTI-DONOR HLA-DQB #1 MFI: 51 MFI
ANTI-DONOR HLA-DQB #2 MFI: 266 MFI
ANTI-DONOR HLA-DR #1 MFI: 300 MFI
ANTI-DONOR HLA-DR #2 MFI: 100 MFI

## 2018-03-28 LAB — COMPREHENSIVE METABOLIC PANEL
A/G RATIO: 1.7 (ref 1.2–2.2)
ALKALINE PHOSPHATASE: 98 IU/L (ref 39–117)
ALT (SGPT): 32 IU/L (ref 0–32)
AST (SGOT): 14 IU/L (ref 0–40)
BILIRUBIN TOTAL: <0.2 mg/dL (ref 0.0–1.2)
BLOOD UREA NITROGEN: 32 mg/dL — ABNORMAL HIGH (ref 8–27)
BUN / CREAT RATIO: 36 — ABNORMAL HIGH (ref 12–28)
CALCIUM: 8.8 mg/dL (ref 8.7–10.3)
CHLORIDE: 111 mmol/L — ABNORMAL HIGH (ref 96–106)
CO2: 16 mmol/L — ABNORMAL LOW (ref 20–29)
GFR MDRD AF AMER: 80 mL/min/{1.73_m2}
GFR MDRD NON AF AMER: 70 mL/min/{1.73_m2}
GLOBULIN, TOTAL: 2.3 g/dL (ref 1.5–4.5)
GLUCOSE: 87 mg/dL (ref 65–99)
POTASSIUM: 4.6 mmol/L (ref 3.5–5.2)
SODIUM: 144 mmol/L (ref 134–144)
TOTAL PROTEIN: 6.2 g/dL (ref 6.0–8.5)

## 2018-03-28 LAB — HLA CLASS 1 ANTIBODY

## 2018-03-28 LAB — DONOR HLA-DQB ANTIGEN #1

## 2018-03-28 LAB — BASOPHILS RELATIVE PERCENT: Lab: 0

## 2018-03-28 LAB — BILIRUBIN DIRECT: Lab: 0.06

## 2018-03-28 LAB — BILIRUBIN, DIRECT: BILIRUBIN DIRECT: 0.06 mg/dL (ref 0.00–0.40)

## 2018-03-28 LAB — CLASS 2 ANTIBODIES IDENTIFIED

## 2018-03-28 LAB — GAMMA GLUTAMYL TRANSFERASE: Lab: 184 — ABNORMAL HIGH

## 2018-03-28 LAB — MAGNESIUM: Lab: 1.3 — ABNORMAL LOW

## 2018-03-28 LAB — GFR MDRD AF AMER: Lab: 80

## 2018-03-28 LAB — FSAB CLASS 1 ANTIBODY SPECIFICITY

## 2018-03-30 LAB — TACROLIMUS BLOOD: Lab: 9.9

## 2018-04-01 MED ORDER — ENVARSUS XR 4 MG TABLET,EXTENDED RELEASE
ORAL_TABLET | 11 refills | 0 days | Status: CP
Start: 2018-04-01 — End: 2018-04-03

## 2018-04-01 MED ORDER — ENVARSUS XR 1 MG TABLET,EXTENDED RELEASE
ORAL_TABLET | 11 refills | 0 days | Status: CP
Start: 2018-04-01 — End: 2018-04-03

## 2018-04-01 NOTE — Unmapped (Signed)
Error

## 2018-04-01 NOTE — Unmapped (Signed)
Per PharmD Mincemoyer and NP Martin-Velez patient to decrease to Envarsus 9 mg daily.  GGT rising and per NP asked about any OTC use recently for patient.  No intervention at this time needed for mag level per providers.  Daughter did report she was taking a lot of Tylenol due to a headache.  Tried to define what a lot was and if it was 3000 mg daily but daughter stated it never was over the max dose and not every day but unsure of specifics other then that.  I advised to decrease the tylenol amount due to the GGT rising.  Made NP mad PharmD aware.

## 2018-04-02 MED FILL — PANTOPRAZOLE/40MG/TBEC: PANTOPRAZOLE/40MG/TBEC | 30 days supply | Qty: 30 | Fill #5

## 2018-04-03 DIAGNOSIS — Z940 Kidney transplant status: Secondary | ICD-10-CM | POA: Diagnosis not present

## 2018-04-03 DIAGNOSIS — Z944 Liver transplant status: Secondary | ICD-10-CM | POA: Diagnosis not present

## 2018-04-03 MED ORDER — ENVARSUS XR 1 MG TABLET,EXTENDED RELEASE
ORAL_TABLET | 11 refills | 0 days | Status: CP
Start: 2018-04-03 — End: 2018-04-08

## 2018-04-03 MED ORDER — ENVARSUS XR 4 MG TABLET,EXTENDED RELEASE
ORAL_TABLET | 11 refills | 0 days | Status: CP
Start: 2018-04-03 — End: 2018-04-08

## 2018-04-03 NOTE — Unmapped (Signed)
Confirmed with patient apt. In June is follow up about her kidney transplant which is going well.  Sent fax to [phone] for Envarsus dose change.

## 2018-04-04 LAB — MAGNESIUM: Lab: 1.5 — ABNORMAL LOW

## 2018-04-04 LAB — COMPREHENSIVE METABOLIC PANEL
A/G RATIO: 1.9 (ref 1.2–2.2)
ALBUMIN: 4.3 g/dL (ref 3.6–4.8)
ALKALINE PHOSPHATASE: 83 IU/L (ref 39–117)
ALT (SGPT): 20 IU/L (ref 0–32)
AST (SGOT): 14 IU/L (ref 0–40)
BLOOD UREA NITROGEN: 21 mg/dL (ref 8–27)
BUN / CREAT RATIO: 32 — ABNORMAL HIGH (ref 12–28)
CALCIUM: 9.4 mg/dL (ref 8.7–10.3)
CHLORIDE: 105 mmol/L (ref 96–106)
CREATININE: 0.66 mg/dL (ref 0.57–1.00)
GFR MDRD AF AMER: 109 mL/min/{1.73_m2}
GFR MDRD NON AF AMER: 95 mL/min/{1.73_m2}
GLOBULIN, TOTAL: 2.3 g/dL (ref 1.5–4.5)
GLUCOSE: 96 mg/dL (ref 65–99)
POTASSIUM: 4.7 mmol/L (ref 3.5–5.2)
SODIUM: 142 mmol/L (ref 134–144)
TOTAL PROTEIN: 6.6 g/dL (ref 6.0–8.5)

## 2018-04-04 LAB — CBC W/ DIFFERENTIAL
BANDED NEUTROPHILS ABSOLUTE COUNT: 0 10*3/uL (ref 0.0–0.1)
BASOPHILS ABSOLUTE COUNT: 0 10*3/uL (ref 0.0–0.2)
BASOPHILS RELATIVE PERCENT: 1 %
EOSINOPHILS ABSOLUTE COUNT: 0.2 10*3/uL (ref 0.0–0.4)
HEMATOCRIT: 35.8 % (ref 34.0–46.6)
HEMOGLOBIN: 11.1 g/dL (ref 11.1–15.9)
IMMATURE GRANULOCYTES: 0 %
LYMPHOCYTES ABSOLUTE COUNT: 2 10*3/uL (ref 0.7–3.1)
LYMPHOCYTES RELATIVE PERCENT: 35 %
MEAN CORPUSCULAR HEMOGLOBIN CONC: 31 g/dL — ABNORMAL LOW (ref 31.5–35.7)
MEAN CORPUSCULAR HEMOGLOBIN: 23.9 pg — ABNORMAL LOW (ref 26.6–33.0)
MEAN CORPUSCULAR VOLUME: 77 fL — ABNORMAL LOW (ref 79–97)
NEUTROPHILS ABSOLUTE COUNT: 2.9 10*3/uL (ref 1.4–7.0)
NEUTROPHILS RELATIVE PERCENT: 49 %
PLATELET COUNT: 355 10*3/uL (ref 150–450)
RED BLOOD CELL COUNT: 4.64 x10E6/uL (ref 3.77–5.28)
RED CELL DISTRIBUTION WIDTH: 17.5 % — ABNORMAL HIGH (ref 12.3–15.4)
WHITE BLOOD CELL COUNT: 5.7 10*3/uL (ref 3.4–10.8)

## 2018-04-04 LAB — HEMATOCRIT: Lab: 35.8

## 2018-04-04 LAB — PHOSPHORUS, SERUM: Lab: 2.1 — ABNORMAL LOW

## 2018-04-04 LAB — GAMMA GLUTAMYL TRANSFERASE: Lab: 114 — ABNORMAL HIGH

## 2018-04-04 LAB — CALCIUM: Lab: 9.4

## 2018-04-04 LAB — BILIRUBIN DIRECT: Lab: 0.08

## 2018-04-06 LAB — TACROLIMUS BLOOD: Lab: 9.2

## 2018-04-08 MED ORDER — ENVARSUS XR 4 MG TABLET,EXTENDED RELEASE
ORAL_TABLET | 11 refills | 0.00000 days | Status: CP
Start: 2018-04-08 — End: 2018-04-08

## 2018-04-08 MED ORDER — ENVARSUS XR 1 MG TABLET,EXTENDED RELEASE: tablet | 11 refills | 0 days | Status: AC

## 2018-04-08 MED ORDER — ENVARSUS XR 4 MG TABLET,EXTENDED RELEASE: tablet | 11 refills | 0 days | Status: AC

## 2018-04-08 MED ORDER — ENVARSUS XR 1 MG TABLET,EXTENDED RELEASE
ORAL_TABLET | 11 refills | 0.00000 days | Status: CP
Start: 2018-04-08 — End: 2018-04-23

## 2018-04-08 NOTE — Unmapped (Signed)
Patient does not need a refill of specialty medication at this time. Moving specialty refill reminder call to appropriate date and removed call attempt data.  Spoke with patient & she states she get the Envarsus from the [facility] and the Myfortic she has plenty of, possibly 2 months worth of it and does not need any refills at this time.

## 2018-04-08 NOTE — Unmapped (Signed)
Reviewed 6/5 tac level with Cleone Slim PharmD. Recommendation was to make no changes and repeat labs. Called pt, spoke to her daughter who confirmed she is scheduled to get labs on Wednesday 6/12.

## 2018-04-09 ENCOUNTER — Encounter (INDEPENDENT_AMBULATORY_CARE_PROVIDER_SITE_OTHER): Payer: Self-pay | Admitting: *Deleted

## 2018-04-10 DIAGNOSIS — Z940 Kidney transplant status: Secondary | ICD-10-CM | POA: Diagnosis not present

## 2018-04-10 DIAGNOSIS — Z944 Liver transplant status: Secondary | ICD-10-CM | POA: Diagnosis not present

## 2018-04-11 LAB — COMPREHENSIVE METABOLIC PANEL
A/G RATIO: 1.9 (ref 1.2–2.2)
ALBUMIN: 4.2 g/dL (ref 3.6–4.8)
ALKALINE PHOSPHATASE: 85 IU/L (ref 39–117)
AST (SGOT): 15 IU/L (ref 0–40)
BILIRUBIN TOTAL: 0.2 mg/dL (ref 0.0–1.2)
BLOOD UREA NITROGEN: 21 mg/dL (ref 8–27)
BUN / CREAT RATIO: 31 — ABNORMAL HIGH (ref 12–28)
CALCIUM: 9.2 mg/dL (ref 8.7–10.3)
CHLORIDE: 104 mmol/L (ref 96–106)
CO2: 22 mmol/L (ref 20–29)
CREATININE: 0.68 mg/dL (ref 0.57–1.00)
GFR MDRD AF AMER: 108 mL/min/{1.73_m2}
GFR MDRD NON AF AMER: 94 mL/min/{1.73_m2}
GLOBULIN, TOTAL: 2.2 g/dL (ref 1.5–4.5)
GLUCOSE: 95 mg/dL (ref 65–99)
SODIUM: 141 mmol/L (ref 134–144)
TOTAL PROTEIN: 6.4 g/dL (ref 6.0–8.5)

## 2018-04-11 LAB — PHOSPHORUS, SERUM: Lab: 2.3 — ABNORMAL LOW

## 2018-04-11 LAB — CBC W/ DIFFERENTIAL
BANDED NEUTROPHILS ABSOLUTE COUNT: 0 10*3/uL (ref 0.0–0.1)
BASOPHILS ABSOLUTE COUNT: 0 10*3/uL (ref 0.0–0.2)
BASOPHILS RELATIVE PERCENT: 0 %
EOSINOPHILS ABSOLUTE COUNT: 0.2 10*3/uL (ref 0.0–0.4)
HEMATOCRIT: 37.2 % (ref 34.0–46.6)
IMMATURE GRANULOCYTES: 0 %
LYMPHOCYTES ABSOLUTE COUNT: 1.8 10*3/uL (ref 0.7–3.1)
LYMPHOCYTES RELATIVE PERCENT: 28 %
MEAN CORPUSCULAR HEMOGLOBIN CONC: 31.5 g/dL (ref 31.5–35.7)
MEAN CORPUSCULAR VOLUME: 79 fL (ref 79–97)
MONOCYTES ABSOLUTE COUNT: 0.6 10*3/uL (ref 0.1–0.9)
MONOCYTES RELATIVE PERCENT: 10 %
NEUTROPHILS ABSOLUTE COUNT: 3.8 10*3/uL (ref 1.4–7.0)
NEUTROPHILS RELATIVE PERCENT: 59 %
PLATELET COUNT: 315 10*3/uL (ref 150–450)
RED BLOOD CELL COUNT: 4.73 x10E6/uL (ref 3.77–5.28)
RED CELL DISTRIBUTION WIDTH: 17 % — ABNORMAL HIGH (ref 12.3–15.4)
WHITE BLOOD CELL COUNT: 6.4 10*3/uL (ref 3.4–10.8)

## 2018-04-11 LAB — BILIRUBIN DIRECT: Lab: 0.07

## 2018-04-11 LAB — GAMMA GLUTAMYL TRANSFERASE: Lab: 82 — ABNORMAL HIGH

## 2018-04-11 LAB — MAGNESIUM: Lab: 1.4 — ABNORMAL LOW

## 2018-04-11 LAB — EOSINOPHILS RELATIVE PERCENT: Lab: 3

## 2018-04-11 LAB — POTASSIUM: Lab: 4.8

## 2018-04-12 LAB — TACROLIMUS BLOOD: Lab: 8.2

## 2018-04-15 MED ORDER — SODIUM POLYSTYRENE SULFONATE 15 GRAM-SORBITOL 20 GRAM/60 ML ORAL SUSP
0 refills | 0 days
Start: 2018-04-15 — End: 2018-09-09

## 2018-04-15 MED ORDER — SODIUM POLYSTYRENE SULFONATE 15 GRAM/60 ML ORAL SUSPENSION
Freq: Every day | ORAL | 0 refills | 0 days | Status: CP
Start: 2018-04-15 — End: 2018-04-17

## 2018-04-15 NOTE — Unmapped (Signed)
Clinical Social Worker Telephone Note    Name:Michelle Travis  Date of Birth:16-Jun-1956  ZOX:[id number]    REFERRAL INFORMATION:  Michelle Travis is post-transplant for liver transplantation . CSW follows up to assess financial questions/planning.    IMPRESSION: Received call from [facility] requesting CMSW to contact daughter, Helmut Muster regarding need for a utility bill to be paid.   See note from [facility] for further details.   Will await call from daughter prior to submission for [employer].       Carole Binning, CMSW  Transplant Case Manager  [facility] for Transplant Care

## 2018-04-15 NOTE — Unmapped (Signed)
Clinical Social Worker Telephone Note    Name:Kennady TAMYAH CUTBIRTH  Date of Birth:[birth year]  UEA:[id number]    REFERRAL INFORMATION:  DOYCE [facility]ONEHOUSE is post-transplant for liver transplantation . CSW follows up to assess financial questions/planning.    IMPRESSION: Received return PC from patient and obtained specifics from patient in order to submit referral for JR funds. Informed patient that expedited status was request but cannot guarantee it will be paid by the time her bill is due on 04/19/18.    Carole Binning, CMSW  Transplant Case Manager  [facility] for Transplant Care

## 2018-04-15 NOTE — Unmapped (Addendum)
According to daughter patient unable to pay her gas bill for the water and cooking.  $400 bill.  Stated she has to be pay $309 by 04/19/18.  Daughter had to pay patient's electric bill with her own disability check meant for her own utilities to prevent the patient from having her electric turned off.  As a result does not have extra money to assist patient at this time.  Daughter has been to social services, churches, and [employer] to get assistance but has not been able to receive anything.  Called Social Worker Bergren who sated she would follow up on this.  Spoke with patient confirming labs are good and provided Child psychotherapist phone number.

## 2018-04-17 DIAGNOSIS — Z940 Kidney transplant status: Secondary | ICD-10-CM | POA: Diagnosis not present

## 2018-04-17 DIAGNOSIS — Z944 Liver transplant status: Secondary | ICD-10-CM | POA: Diagnosis not present

## 2018-04-18 LAB — CBC W/ DIFFERENTIAL
BANDED NEUTROPHILS ABSOLUTE COUNT: 0 10*3/uL (ref 0.0–0.1)
BASOPHILS ABSOLUTE COUNT: 0 10*3/uL (ref 0.0–0.2)
BASOPHILS RELATIVE PERCENT: 0 %
EOSINOPHILS ABSOLUTE COUNT: 0.1 10*3/uL (ref 0.0–0.4)
EOSINOPHILS RELATIVE PERCENT: 2 %
HEMATOCRIT: 36.6 % (ref 34.0–46.6)
HEMOGLOBIN: 11.4 g/dL (ref 11.1–15.9)
IMMATURE GRANULOCYTES: 0 %
LYMPHOCYTES ABSOLUTE COUNT: 1.7 10*3/uL (ref 0.7–3.1)
MEAN CORPUSCULAR HEMOGLOBIN CONC: 31.1 g/dL — ABNORMAL LOW (ref 31.5–35.7)
MEAN CORPUSCULAR HEMOGLOBIN: 24.5 pg — ABNORMAL LOW (ref 26.6–33.0)
MONOCYTES ABSOLUTE COUNT: 0.7 10*3/uL (ref 0.1–0.9)
NEUTROPHILS RELATIVE PERCENT: 57 %
PLATELET COUNT: 352 10*3/uL (ref 150–450)
RED BLOOD CELL COUNT: 4.66 x10E6/uL (ref 3.77–5.28)
RED CELL DISTRIBUTION WIDTH: 16.6 % — ABNORMAL HIGH (ref 12.3–15.4)
WHITE BLOOD CELL COUNT: 5.9 10*3/uL (ref 3.4–10.8)

## 2018-04-18 LAB — COMPREHENSIVE METABOLIC PANEL
A/G RATIO: 1.7 (ref 1.2–2.2)
ALKALINE PHOSPHATASE: 84 IU/L (ref 39–117)
ALT (SGPT): 14 IU/L (ref 0–32)
AST (SGOT): 13 IU/L (ref 0–40)
BILIRUBIN TOTAL: <0.2 mg/dL (ref 0.0–1.2)
BLOOD UREA NITROGEN: 23 mg/dL (ref 8–27)
BUN / CREAT RATIO: 29 — ABNORMAL HIGH (ref 12–28)
CALCIUM: 9.4 mg/dL (ref 8.7–10.3)
CHLORIDE: 105 mmol/L (ref 96–106)
CO2: 21 mmol/L (ref 20–29)
CREATININE: 0.79 mg/dL (ref 0.57–1.00)
GFR MDRD NON AF AMER: 80 mL/min/{1.73_m2}
GLOBULIN, TOTAL: 2.4 g/dL (ref 1.5–4.5)
GLUCOSE: 87 mg/dL (ref 65–99)
POTASSIUM: 4.8 mmol/L (ref 3.5–5.2)
TOTAL PROTEIN: 6.5 g/dL (ref 6.0–8.5)

## 2018-04-18 LAB — PHOSPHORUS, SERUM: Lab: 3.1

## 2018-04-18 LAB — BASOPHILS ABSOLUTE COUNT: Lab: 0

## 2018-04-18 LAB — BILIRUBIN DIRECT: Lab: 0.07

## 2018-04-18 LAB — GAMMA GLUTAMYL TRANSFERASE: Lab: 61 — ABNORMAL HIGH

## 2018-04-18 LAB — BLOOD UREA NITROGEN: Lab: 23

## 2018-04-18 LAB — MAGNESIUM: Lab: 1.5 — ABNORMAL LOW

## 2018-04-20 LAB — TACROLIMUS BLOOD: Lab: 8.7

## 2018-04-22 MED FILL — ASPIRIN LOW DOSE/81MG EC/TBEC: ASPIRIN LOW DOSE/81MG EC/TBEC | 30 days supply | Qty: 30 | Fill #5

## 2018-04-23 MED ORDER — ENVARSUS XR 1 MG TABLET,EXTENDED RELEASE
ORAL_TABLET | 11 refills | 0 days | Status: CP
Start: 2018-04-23 — End: 2018-05-07

## 2018-04-23 MED ORDER — ENVARSUS XR 4 MG TABLET,EXTENDED RELEASE
ORAL_TABLET | 11 refills | 0 days | Status: CP
Start: 2018-04-23 — End: 2018-05-07

## 2018-04-23 NOTE — Unmapped (Signed)
Per PharmD Mincemoyer patient to decrease Prograf to 7 mg BID.  Daughter verbalized understanding.  Sent fax with updated order to [employer] assistance.   To: : [facility]   fax number: [phone]

## 2018-04-24 ENCOUNTER — Ambulatory Visit: Admit: 2018-04-24 | Discharge: 2018-04-25 | Payer: MEDICARE

## 2018-04-24 ENCOUNTER — Ambulatory Visit: Admit: 2018-04-24 | Discharge: 2018-04-25 | Payer: MEDICARE | Attending: Nephrology | Primary: Nephrology

## 2018-04-24 DIAGNOSIS — D472 Monoclonal gammopathy: Secondary | ICD-10-CM | POA: Diagnosis not present

## 2018-04-24 DIAGNOSIS — Z79899 Other long term (current) drug therapy: Secondary | ICD-10-CM | POA: Diagnosis not present

## 2018-04-24 DIAGNOSIS — Z48298 Encounter for aftercare following other organ transplant: Secondary | ICD-10-CM | POA: Diagnosis not present

## 2018-04-24 DIAGNOSIS — N022 Recurrent and persistent hematuria with diffuse membranous glomerulonephritis: Secondary | ICD-10-CM | POA: Diagnosis not present

## 2018-04-24 DIAGNOSIS — Z4822 Encounter for aftercare following kidney transplant: Secondary | ICD-10-CM | POA: Diagnosis not present

## 2018-04-24 DIAGNOSIS — Z940 Kidney transplant status: Secondary | ICD-10-CM | POA: Diagnosis not present

## 2018-04-24 DIAGNOSIS — Z944 Liver transplant status: Secondary | ICD-10-CM | POA: Diagnosis not present

## 2018-04-24 DIAGNOSIS — M8580 Other specified disorders of bone density and structure, unspecified site: Secondary | ICD-10-CM | POA: Diagnosis not present

## 2018-04-24 DIAGNOSIS — Z0000 Encounter for general adult medical examination without abnormal findings: Secondary | ICD-10-CM

## 2018-04-24 LAB — URINALYSIS
BILIRUBIN UA: NEGATIVE
GLUCOSE UA: NEGATIVE
KETONES UA: NEGATIVE
NITRITE UA: NEGATIVE
PH UA: 5 (ref 5.0–9.0)
PROTEIN UA: 30 — AB
RBC UA: 1 /HPF (ref <=4)
SPECIFIC GRAVITY UA: 1.024 (ref 1.003–1.030)
SQUAMOUS EPITHELIAL: 1 /HPF (ref 0–5)
UROBILINOGEN UA: 0.2
WBC UA: <1 /HPF (ref 0–5)

## 2018-04-24 LAB — CBC W/ AUTO DIFF
BASOPHILS ABSOLUTE COUNT: 0.1 10*9/L (ref 0.0–0.1)
BASOPHILS RELATIVE PERCENT: 0.7 %
EOSINOPHILS ABSOLUTE COUNT: 0.1 10*9/L (ref 0.0–0.4)
EOSINOPHILS RELATIVE PERCENT: 1.9 %
HEMATOCRIT: 38.3 % (ref 36.0–46.0)
HEMOGLOBIN: 11.8 g/dL — ABNORMAL LOW (ref 12.0–16.0)
LARGE UNSTAINED CELLS: 2 % (ref 0–4)
LYMPHOCYTES RELATIVE PERCENT: 26.5 %
MEAN CORPUSCULAR HEMOGLOBIN CONC: 30.8 g/dL — ABNORMAL LOW (ref 31.0–37.0)
MEAN CORPUSCULAR HEMOGLOBIN: 24.9 pg — ABNORMAL LOW (ref 26.0–34.0)
MEAN CORPUSCULAR VOLUME: 80.9 fL (ref 80.0–100.0)
MEAN PLATELET VOLUME: 8.8 fL (ref 7.0–10.0)
MONOCYTES ABSOLUTE COUNT: 0.5 10*9/L (ref 0.2–0.8)
MONOCYTES RELATIVE PERCENT: 6.6 %
NEUTROPHILS ABSOLUTE COUNT: 4.5 10*9/L (ref 2.0–7.5)
NEUTROPHILS RELATIVE PERCENT: 62.1 %
PLATELET COUNT: 404 10*9/L (ref 150–440)
RED BLOOD CELL COUNT: 4.74 10*12/L (ref 4.00–5.20)
RED CELL DISTRIBUTION WIDTH: 15.8 % — ABNORMAL HIGH (ref 12.0–15.0)

## 2018-04-24 LAB — COMPREHENSIVE METABOLIC PANEL
ALBUMIN: 4.3 g/dL (ref 3.5–5.0)
ALKALINE PHOSPHATASE: 73 U/L (ref 38–126)
ALT (SGPT): 16 U/L (ref 15–48)
ANION GAP: 6 mmol/L — ABNORMAL LOW (ref 9–15)
AST (SGOT): 17 U/L (ref 14–38)
BILIRUBIN TOTAL: 0.3 mg/dL (ref 0.0–1.2)
BLOOD UREA NITROGEN: 25 mg/dL — ABNORMAL HIGH (ref 7–21)
BUN / CREAT RATIO: 35
CALCIUM: 9.7 mg/dL (ref 8.5–10.2)
CHLORIDE: 110 mmol/L — ABNORMAL HIGH (ref 98–107)
CO2: 27 mmol/L (ref 22.0–30.0)
CREATININE: 0.71 mg/dL (ref 0.60–1.00)
EGFR CKD-EPI AA FEMALE: >90 mL/min/{1.73_m2} (ref >=60)
EGFR CKD-EPI NON-AA FEMALE: >90 mL/min/{1.73_m2} (ref >=60)
GLUCOSE RANDOM: 97 mg/dL (ref 65–99)
POTASSIUM: 5 mmol/L (ref 3.5–5.0)
PROTEIN TOTAL: 7 g/dL (ref 6.5–8.3)
SODIUM: 143 mmol/L (ref 135–145)

## 2018-04-24 LAB — LIPID PANEL
CHOLESTEROL/HDL RATIO SCREEN: 4.9 (ref <5.0)
CHOLESTEROL: 236 mg/dL — ABNORMAL HIGH (ref 100–199)
LDL CHOLESTEROL CALCULATED: 141 mg/dL — ABNORMAL HIGH (ref 60–99)
NON-HDL CHOLESTEROL: 188 mg/dL
TRIGLYCERIDES: 237 mg/dL — ABNORMAL HIGH (ref 1–149)
VLDL CHOLESTEROL CAL: 47.4 mg/dL — ABNORMAL HIGH (ref 11–41)

## 2018-04-24 LAB — CREATININE, URINE: Lab: 111.8

## 2018-04-24 LAB — MEAN CORPUSCULAR HEMOGLOBIN: Lab: 24.9 — ABNORMAL LOW

## 2018-04-24 LAB — FASTING

## 2018-04-24 LAB — SMEAR REVIEW

## 2018-04-24 LAB — CALCIUM: Calcium:MCnc:Pt:Ser/Plas:Qn:: 9.7

## 2018-04-24 LAB — TACROLIMUS, TROUGH
Lab: 6.5
Lab: 6.8

## 2018-04-24 LAB — TACROLIMUS LEVEL, TROUGH: TACROLIMUS, TROUGH: 6.5 ng/mL (ref 5.0–15.0)

## 2018-04-24 LAB — PHOSPHORUS: Phosphate:MCnc:Pt:Ser/Plas:Qn:: 3.1

## 2018-04-24 LAB — BILIRUBIN DIRECT: Bilirubin.glucuronidated:MCnc:Pt:Ser/Plas:Qn:: <0.1

## 2018-04-24 LAB — GLUCOSE RANDOM: Glucose:MCnc:Pt:Ser/Plas:Qn:: 97

## 2018-04-24 LAB — MAGNESIUM: Magnesium:MCnc:Pt:Ser/Plas:Qn:: 1.4 — ABNORMAL LOW

## 2018-04-24 LAB — VITAMIN D, TOTAL (25OH): Lab: 20

## 2018-04-24 LAB — CYCLOSPORINE, TROUGH: Lab: <10 — ABNORMAL LOW

## 2018-04-24 LAB — GAMMA GLUTAMYL TRANSFERASE: Gamma glutamyl transferase:CCnc:Pt:Ser/Plas:Qn:: 63 — ABNORMAL HIGH

## 2018-04-24 LAB — LEUKOCYTE ESTERASE UA: Lab: NEGATIVE

## 2018-04-24 NOTE — Unmapped (Signed)
A urine specimen was collected at the visit.

## 2018-04-24 NOTE — Unmapped (Signed)
Transplant Nephrology Clinic Visit    History of Present Illness  Michelle Travis is a 62 y.o. 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  She had delayed renal allograft function which required hemodialysis beginning on 09/14/17 and ending 09/24/2017. She has no history of rejection. Proteinuria has gradually improved post-transplant. Urine output prior to transplant was minimal. She has not had a kdiney biopsy since transplant.      She presents today complaining of increased appetite and weight gain. She is otherwise without complaints.. Home blood pressures have been <140/<90. She denies nausea, vomiting, diarrhea, chest pain, shortness of breath, edema, headache, dysuria, fever or chills. She notes hand tremors.       Transplant History:  1. 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 Kidney KDPI 26%. CMV D+/R+; EBV D-/R+, hep C Ab +/NAT- (recipient hep C Ab negative)  4. Complicated by rectus sheath hematoma  5. Delayed graft function requiring hemodialysis sessions following transplant (stopped dialysis 11/26/018).  6. Immunosuppression was ??? induction followed by tacrolimus and myfortic maintenance therapy.   7. Baseline creatinine 1.2 mg/dL.    Past Medical History:  1. Alcoholic cirrhosis, S/P TIPS  2. Membranous nephropathy, PLA2R positive biopsy 08/30/2016, treated solumedrol initially with little improvement then with Rituximab (10/2016 and 03/2017).   3. S/P kidney and liver transplants as stated above  4. History of breast cancer, in remission for 17 years, s/p lumpectomy.  5. History of IGG monoclonal gammopathy, IgG lambda. Kidney biopsy 08/30/2016 without MGRS.  6. S/p cerebral aneurysm repair.  7. S/P total knee replacement  8. Essential HTN prior to ESLD  9. History of diverticulitis  10. Recurrent pleural effusion, history of pneumonia x 3  11. Osteopenia by DEXA 06/14/2016  12. Hypoattenuating thyroid nodule noted on chest CT 11/11/2016  13. Sleep apnea    Review of Systems    All other systems are reviewed and are negative.    Medications    Current Outpatient Medications   Medication Sig Dispense Refill   ??? acetaminophen (TYLENOL) 325 MG tablet Take 1-2 tablets (325-650 mg total) by mouth every six (6) hours as needed for pain. 100 tablet 0   ??? aspirin (ECOTRIN) 81 MG tablet Take 1 tablet (81 mg total) by mouth daily. 30 tablet 11   ??? cholecalciferol, vitamin D3, (CHOLECALCIFEROL) 1,000 unit tablet Take 2 tablets (2,000 Units total) by mouth daily. 60 tablet 11   ??? ENVARSUS XR 1 mg Tb24 extended release tablet 7 mg daily (one 4 mg tablet and three 1 mg tablet) 90 tablet 11   ??? ENVARSUS XR 4 mg Tb24 extended release tablet 7 mg daily (one 4 mg tablet and three 1 mg tablet) 30 tablet 11   ??? MYFORTIC 180 mg EC tablet Take 1 tablet (180 mg total) by mouth Two (2) times a day. 60 tablet 11   ??? pantoprazole (PROTONIX) 40 MG tablet Take 1 tablet (40 mg total) by mouth daily as needed. 30 tablet PRN   ??? predniSONE (DELTASONE) 5 MG tablet Take 5 mg by mouth daily.     ??? tbo-filgrastim (GRANIX) 300 mcg/0.5 mL Syrg injection Take directed (Patient not taking: Reported on 04/24/2018) 3 Syringe 0     No current facility-administered medications for this visit.  Physical Exam    BP 136/88 (BP Site: R Arm, BP Position: Sitting, BP Cuff Size: Medium)  - Pulse 72  - Temp 36.7 ??C (98 ??F) (Temporal)  - Ht 160 cm (5' 2.99)  - Wt 83.5 kg (184 lb)  - BMI 32.60 kg/m??   General: Patient is a pleasant female in no apparent distress.  Eyes: Sclera anicteric.  Neck: Supple without LAD/JVD/bruits.  Lungs: Clear to auscultation bilaterally, no wheezes/rales/rhonchi.  Cardiovascular: Regular rate and rhythm without murmurs, rubs or gallops.  Abdomen: Soft, notender/nondistended. Positive bowel sounds. No hepatosplenomegaly, masses or bruits appreciated.   Extremities: Without edema, joints without evidence of synovitis  Skin: Without rash  Neurological: Grossly nonfocal.  Psychiatric: Mood and affect appropriate.    Laboratory Results    Recent Results (from the past 170 hour(s))   Lipid Panel    Collection Time: 04/24/18  9:24 AM   Result Value Ref Range    Triglycerides 237 (H) 1 - 149 mg/dL    Cholesterol 098 (H) 100 - 199 mg/dL    HDL 48 40 - 59 mg/dL    LDL Calculated 119 (H) 60 - 99 mg/dL    VLDL Cholesterol Cal 47.4 (H) 11 - 41 mg/dL    Chol/HDL Ratio 4.9 <1.4    Non-HDL Cholesterol 188 mg/dL    FASTING Yes    Tacrolimus Level, Trough    Collection Time: 04/24/18  9:24 AM   Result Value Ref Range    Tacrolimus, Trough 6.8 5.0 - 15.0 ng/mL   Magnesium Level    Collection Time: 04/24/18  9:24 AM   Result Value Ref Range    Magnesium 1.4 (L) 1.6 - 2.2 mg/dL   Phosphorus Level    Collection Time: 04/24/18  9:24 AM   Result Value Ref Range    Phosphorus 3.1 2.9 - 4.7 mg/dL   Parathyroid Hormone (PTH)    Collection Time: 04/24/18  9:24 AM   Result Value Ref Range    PTH 164.3 (H) 12.0 - 72.0 pg/mL    Calcium 9.7 8.5 - 10.2 mg/dL   Vitamin D 25 Hydroxy (25OH D2 + D3)    Collection Time: 04/24/18  9:24 AM   Result Value Ref Range    Vitamin D Total (25OH) 20.0 20.0 - 80.0 ng/mL   Cyclosporine, Trough    Collection Time: 04/24/18  9:24 AM   Result Value Ref Range    Cyclosporine, Trough <10 (L) 100 - 400 ng/mL   Comprehensive Metabolic Panel    Collection Time: 04/24/18  9:24 AM   Result Value Ref Range    Sodium 143 135 - 145 mmol/L    Potassium 5.0 3.5 - 5.0 mmol/L    Chloride 110 (H) 98 - 107 mmol/L    CO2 27.0 22.0 - 30.0 mmol/L    Anion Gap 6 (L) 9 - 15 mmol/L    BUN 25 (H) 7 - 21 mg/dL    Creatinine 7.82 9.56 - 1.00 mg/dL    BUN/Creatinine Ratio 35     EGFR CKD-EPI Non-African American, Female >90 >=60 mL/min/1.55m2    EGFR CKD-EPI African American, Female >90 >=60 mL/min/1.70m2    Glucose 97 65 - 99 mg/dL    Calcium 9.7 8.5 - 21.3 mg/dL    Albumin 4.3 3.5 - 5.0 g/dL    Total Protein 7.0 6.5 - 8.3 g/dL    Total Bilirubin 0.3 0.0 - 1.2 mg/dL    AST 17 14 - 38 U/L  ALT 16 15 - 48 U/L    Alkaline Phosphatase 73 38 - 126 U/L   Gamma GT (GGT)    Collection Time: 04/24/18  9:24 AM   Result Value Ref Range    GGT 63 (H) 11 - 48 U/L   Bilirubin, Direct    Collection Time: 04/24/18  9:24 AM   Result Value Ref Range    Bilirubin, Direct <0.10 0.00 - 0.40 mg/dL   CBC w/ Differential    Collection Time: 04/24/18  9:24 AM   Result Value Ref Range    WBC 7.3 4.5 - 11.0 10*9/L    RBC 4.74 4.00 - 5.20 10*12/L    HGB 11.8 (L) 12.0 - 16.0 g/dL    HCT 16.1 09.6 - 04.5 %    MCV 80.9 80.0 - 100.0 fL    MCH 24.9 (L) 26.0 - 34.0 pg    MCHC 30.8 (L) 31.0 - 37.0 g/dL    RDW 40.9 (H) 81.1 - 15.0 %    MPV 8.8 7.0 - 10.0 fL    Platelet 404 150 - 440 10*9/L    Neutrophils % 62.1 %    Lymphocytes % 26.5 %    Monocytes % 6.6 %    Eosinophils % 1.9 %    Basophils % 0.7 %    Absolute Neutrophils 4.5 2.0 - 7.5 10*9/L    Absolute Lymphocytes 1.9 1.5 - 5.0 10*9/L    Absolute Monocytes 0.5 0.2 - 0.8 10*9/L    Absolute Eosinophils 0.1 0.0 - 0.4 10*9/L    Absolute Basophils 0.1 0.0 - 0.1 10*9/L    Large Unstained Cells 2 0 - 4 %    Microcytosis Slight (A) Not Present    Hypochromasia Marked (A) Not Present   Morphology Review    Collection Time: 04/24/18  9:24 AM   Result Value Ref Range    Smear Review Comments See Comment (A) Undefined   Tacrolimus Level, Trough    Collection Time: 04/24/18  9:24 AM   Result Value Ref Range    Tacrolimus, Trough 6.5 5.0 - 15.0 ng/mL   Urine Culture    Collection Time: 04/24/18  9:38 AM   Result Value Ref Range    Urine Culture, Comprehensive Mixed Urogenital Flora    Protein/Creatinine Ratio, Urine    Collection Time: 04/24/18  9:38 AM   Result Value Ref Range    Creat U 111.8 Undefined mg/dL    Protein, Ur 91.4 Undefined mg/dL    Protein/Creatinine Ratio, Urine 0.640 Undefined   Urinalysis    Collection Time: 04/24/18  9:38 AM   Result Value Ref Range    Color, UA Yellow     Clarity, UA Clear     Specific Gravity, UA 1.024 1.003 - 1.030    pH, UA 5.0 5.0 - 9.0    Leukocyte Esterase, UA Negative Negative    Nitrite, UA Negative Negative    Protein, UA 30 mg/dL (A) Negative    Glucose, UA Negative Negative    Ketones, UA Negative Negative    Urobilinogen, UA 0.2 mg/dL 0.2 mg/dL, 1.0 mg/dL    Bilirubin, UA Negative Negative    Blood, UA Trace (A) Negative    RBC, UA 1 <=4 /HPF    WBC, UA <1 0 - 5 /HPF    Squam Epithel, UA 1 0 - 5 /HPF    Bacteria, UA Rare (A) None Seen /HPF    Mucus, UA Rare (A) None Seen /HPF  Assessment and Plan  Michelle Travis is a 62 y.o. 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 Active medical issues include:    1. Status post kidney transplant. Creatinine today is 0.71 mg/dL with baseline range of <1.0 mg/dL. UP/C today is 0.640, an improvement from her last value on 5/20 of 1.51. It is unclear if the mild proteinuria is of native kidney source or related to transplant recurrence of PLA2R positive membranous nephropathy. Given the low level of proteinuria and improvement with time I do not believe patient requires a renal biopsy at this time. Her last dose of Rituximab was 03/2017 with no plans for repeat dosing unless recurrence is confirmed.     2. S/P Liver transplant with excellent liver function. She will continue to follow with the liver transplant team.    3. Immunosuppression. Patient will continue Envarsus 7 mg daily and prednisone 5 mg daily. Will increase myfortic to 360 mg BID today. Patient had been on a low dose myfortic due to leukopenia and diarrhea.    4. History of IgG lambda monoclonal gammopathy. Will check periodic SPEP, serum free light chains.     5. Follow-up. Patient will return to clinic in 2 months.     Scribe's Attestation: Jackey Loge, MD obtained and performed the history, physical exam and medical decision making elements that were entered into the chart.  Signed by Delaney Meigs, Scribe, on April 24, 2018 10:40 AM    ----------------------------------------------------------------------------------------------------------------------  April 25, 2018 10:51 AM. Documentation assistance provided by the Scribe. I was present during the time the encounter was recorded. The information recorded by the Scribe was done at my direction and has been reviewed and validated by me.  ----------------------------------------------------------------------------------------------------------------------

## 2018-04-25 LAB — CMV DNA, QUANTITATIVE, PCR: CMV VIRAL LD: NOT DETECTED

## 2018-04-25 LAB — CMV COMMENT: Lab: 0

## 2018-04-26 LAB — BK BLOOD LOG(10): Lab: 0

## 2018-04-26 LAB — BK VIRUS QUANTITATIVE PCR, BLOOD

## 2018-05-01 DIAGNOSIS — Z944 Liver transplant status: Secondary | ICD-10-CM | POA: Diagnosis not present

## 2018-05-01 DIAGNOSIS — Z940 Kidney transplant status: Secondary | ICD-10-CM | POA: Diagnosis not present

## 2018-05-01 LAB — HLA DS POST TRANSPLANT
ANTI-DONOR HLA-A #1 MFI: 39 MFI
ANTI-DONOR HLA-A #2 MFI: 71 MFI
ANTI-DONOR HLA-B #1 MFI: 52 MFI
ANTI-DONOR HLA-B #2 MFI: 78 MFI
ANTI-DONOR HLA-C #1 MFI: 29 MFI
ANTI-DONOR HLA-C #2 MFI: 65 MFI
ANTI-DONOR HLA-DP #2 MFI: 133 MFI
ANTI-DONOR HLA-DQB #1 MFI: 18 MFI
ANTI-DONOR HLA-DQB #2 MFI: 184 MFI
ANTI-DONOR HLA-DR #1 MFI: 177 MFI
ANTI-DONOR HLA-DR #2 MFI: 48 MFI

## 2018-05-01 LAB — FSAB CLASS 1 ANTIBODY SPECIFICITY

## 2018-05-01 LAB — FSAB CLASS 2 ANTIBODY SPECIFICITY

## 2018-05-01 LAB — HLA CL2 AB RESULT: Lab: POSITIVE

## 2018-05-01 LAB — HLA CLASS 1 ANTIBODY

## 2018-05-01 LAB — ANTI-DONOR HLA-C #1 MFI: Lab: 29

## 2018-05-01 MED ORDER — ATORVASTATIN 10 MG TABLET
ORAL_TABLET | Freq: Every day | ORAL | 11 refills | 0.00000 days | Status: CP
Start: 2018-05-01 — End: 2018-09-09

## 2018-05-01 MED ORDER — ATORVASTATIN 10 MG TABLET: 10 mg | tablet | Freq: Every day | 11 refills | 0 days | Status: AC

## 2018-05-01 NOTE — Unmapped (Signed)
Per PharmD Mincemoyer patient to start atorvastatin 10 mg daily due to elevated lipid panel.  Daughter verbalized understanding.

## 2018-05-02 LAB — CBC W/ DIFFERENTIAL
BANDED NEUTROPHILS ABSOLUTE COUNT: 0 10*3/uL (ref 0.0–0.1)
BASOPHILS ABSOLUTE COUNT: 0 10*3/uL (ref 0.0–0.2)
BASOPHILS RELATIVE PERCENT: 1 %
EOSINOPHILS RELATIVE PERCENT: 2 %
HEMOGLOBIN: 12.5 g/dL (ref 11.1–15.9)
LYMPHOCYTES ABSOLUTE COUNT: 1.8 10*3/uL (ref 0.7–3.1)
LYMPHOCYTES RELATIVE PERCENT: 29 %
MEAN CORPUSCULAR HEMOGLOBIN CONC: 33 g/dL (ref 31.5–35.7)
MEAN CORPUSCULAR HEMOGLOBIN: 25.9 pg — ABNORMAL LOW (ref 26.6–33.0)
MEAN CORPUSCULAR VOLUME: 79 fL (ref 79–97)
MONOCYTES RELATIVE PERCENT: 8 %
NEUTROPHILS ABSOLUTE COUNT: 3.8 10*3/uL (ref 1.4–7.0)
NEUTROPHILS RELATIVE PERCENT: 59 %
PLATELET COUNT: 331 10*3/uL (ref 150–450)
RED BLOOD CELL COUNT: 4.82 x10E6/uL (ref 3.77–5.28)
RED CELL DISTRIBUTION WIDTH: 17.1 % — ABNORMAL HIGH (ref 12.3–15.4)
WHITE BLOOD CELL COUNT: 6.3 10*3/uL (ref 3.4–10.8)

## 2018-05-02 LAB — COMPREHENSIVE METABOLIC PANEL
A/G RATIO: 2 (ref 1.2–2.2)
ALBUMIN: 4.4 g/dL (ref 3.6–4.8)
ALKALINE PHOSPHATASE: 78 IU/L (ref 39–117)
ALT (SGPT): 15 IU/L (ref 0–32)
AST (SGOT): 14 IU/L (ref 0–40)
BILIRUBIN TOTAL: 0.2 mg/dL (ref 0.0–1.2)
BLOOD UREA NITROGEN: 19 mg/dL (ref 8–27)
CALCIUM: 9.6 mg/dL (ref 8.7–10.3)
CHLORIDE: 108 mmol/L — ABNORMAL HIGH (ref 96–106)
CREATININE: 0.74 mg/dL (ref 0.57–1.00)
GFR MDRD AF AMER: 100 mL/min/{1.73_m2}
GFR MDRD NON AF AMER: 87 mL/min/{1.73_m2}
GLOBULIN, TOTAL: 2.2 g/dL (ref 1.5–4.5)
GLUCOSE: 98 mg/dL (ref 65–99)
POTASSIUM: 4.8 mmol/L (ref 3.5–5.2)
SODIUM: 144 mmol/L (ref 134–144)
TOTAL PROTEIN: 6.6 g/dL (ref 6.0–8.5)

## 2018-05-02 LAB — BILIRUBIN DIRECT: Lab: 0.08

## 2018-05-02 LAB — MAGNESIUM: Lab: 1.6

## 2018-05-02 LAB — PHOSPHORUS, SERUM: Lab: 2.5

## 2018-05-02 LAB — GLUCOSE: Lab: 98

## 2018-05-02 LAB — MEAN CORPUSCULAR HEMOGLOBIN CONC: Lab: 33

## 2018-05-02 LAB — GAMMA GLUTAMYL TRANSFERASE: Lab: 43

## 2018-05-05 MED FILL — PANTOPRAZOLE/40MG/TBEC: PANTOPRAZOLE/40MG/TBEC | 30 days supply | Qty: 30 | Fill #6

## 2018-05-06 LAB — VITAMIN D 1,25-DIHYDROXY: 1,25-Dihydroxyvitamin D:MCnc:Pt:Ser/Plas:Qn:: 45

## 2018-05-06 MED FILL — PREDNISONE/5MG/TABS: PREDNISONE/5MG/TABS | 30 days supply | Qty: 60 | Fill #1

## 2018-05-07 DIAGNOSIS — Z944 Liver transplant status: Secondary | ICD-10-CM | POA: Diagnosis not present

## 2018-05-07 DIAGNOSIS — Z940 Kidney transplant status: Secondary | ICD-10-CM | POA: Diagnosis not present

## 2018-05-07 LAB — TACROLIMUS BLOOD: Lab: 5.3

## 2018-05-07 MED ORDER — ENVARSUS XR 4 MG TABLET,EXTENDED RELEASE
ORAL_TABLET | Freq: Every day | ORAL | 11 refills | 0 days | Status: CP
Start: 2018-05-07 — End: 2018-06-11

## 2018-05-07 NOTE — Unmapped (Addendum)
Per PharmD Mincemoyer and NP Martin-Velez increase Envarsus to 8 mg daily.  Patient verbalized understanding.  Mentioned daughter is getting back surgery tomorrow.       Patient suffering from a daily throbbing headache 7-8/10.  Taking 325 mg Tylenol requiring 6 pills a day. When tylenol is working headache goes down to 3/10.  Headache never goes away. Patient reports BPs are stable. Wants to know if she can taking something else for the headache or what is causing it.  Also reports knee pain that is present in her knee that never received the knee replacement. Let patient know she can take 9 of the 325 mg Tylenol pills a day but that her requirement for daily Tylenol is concerning so I will check with transplant provider and get back to her.       Per PharmD Mince moyer headaches are likely from the Envarsus so patient to continue to get labs to check drug levels.  May consider decrease Tac goal in future.  Left VM for patient to make aware and to use less Tylenol.  Also sent fax with updated script to pharmacy.

## 2018-05-08 LAB — COMPREHENSIVE METABOLIC PANEL
A/G RATIO: 2.1 (ref 1.2–2.2)
ALBUMIN: 4.4 g/dL (ref 3.6–4.8)
ALKALINE PHOSPHATASE: 70 IU/L (ref 39–117)
AST (SGOT): 14 IU/L (ref 0–40)
BLOOD UREA NITROGEN: 14 mg/dL (ref 8–27)
BUN / CREAT RATIO: 22 (ref 12–28)
CALCIUM: 9 mg/dL (ref 8.7–10.3)
CHLORIDE: 106 mmol/L (ref 96–106)
CO2: 24 mmol/L (ref 20–29)
CREATININE: 0.64 mg/dL (ref 0.57–1.00)
GFR MDRD AF AMER: 111 mL/min/{1.73_m2}
GFR MDRD NON AF AMER: 96 mL/min/{1.73_m2}
GLOBULIN, TOTAL: 2.1 g/dL (ref 1.5–4.5)
GLUCOSE: 99 mg/dL (ref 65–99)
POTASSIUM: 4.4 mmol/L (ref 3.5–5.2)
SODIUM: 142 mmol/L (ref 134–144)
TOTAL PROTEIN: 6.5 g/dL (ref 6.0–8.5)

## 2018-05-08 LAB — BILIRUBIN DIRECT: Lab: 0.07

## 2018-05-08 LAB — CBC W/ DIFFERENTIAL
BANDED NEUTROPHILS ABSOLUTE COUNT: 0 10*3/uL (ref 0.0–0.1)
BASOPHILS RELATIVE PERCENT: 0 %
EOSINOPHILS ABSOLUTE COUNT: 0.2 10*3/uL (ref 0.0–0.4)
EOSINOPHILS RELATIVE PERCENT: 3 %
HEMATOCRIT: 35.9 % (ref 34.0–46.6)
IMMATURE GRANULOCYTES: 0 %
LYMPHOCYTES ABSOLUTE COUNT: 1.6 10*3/uL (ref 0.7–3.1)
LYMPHOCYTES RELATIVE PERCENT: 25 %
MEAN CORPUSCULAR HEMOGLOBIN CONC: 30.9 g/dL — ABNORMAL LOW (ref 31.5–35.7)
MEAN CORPUSCULAR HEMOGLOBIN: 24.5 pg — ABNORMAL LOW (ref 26.6–33.0)
MEAN CORPUSCULAR VOLUME: 79 fL (ref 79–97)
MONOCYTES ABSOLUTE COUNT: 0.6 10*3/uL (ref 0.1–0.9)
MONOCYTES RELATIVE PERCENT: 9 %
NEUTROPHILS ABSOLUTE COUNT: 4.1 10*3/uL (ref 1.4–7.0)
NEUTROPHILS RELATIVE PERCENT: 63 %
PLATELET COUNT: 306 10*3/uL (ref 150–450)
RED BLOOD CELL COUNT: 4.53 x10E6/uL (ref 3.77–5.28)
RED CELL DISTRIBUTION WIDTH: 16.2 % — ABNORMAL HIGH (ref 12.3–15.4)
WHITE BLOOD CELL COUNT: 6.5 10*3/uL (ref 3.4–10.8)

## 2018-05-08 LAB — RED BLOOD CELL COUNT: Lab: 4.53

## 2018-05-08 LAB — MAGNESIUM: Lab: 1.4 — ABNORMAL LOW

## 2018-05-08 LAB — BUN / CREAT RATIO: Lab: 22

## 2018-05-08 LAB — PHOSPHORUS, SERUM: Lab: 2.6

## 2018-05-08 LAB — GAMMA GLUTAMYL TRANSFERASE: Lab: 38

## 2018-05-09 NOTE — Unmapped (Addendum)
Per NP Martin-Velez patient to increase Myfortic to 360 mg BID and reamin on Envarsus 7 mg daily.  This increase is to help decrease tac goal to help with patient headaches. Left VM for both patient and daughter.     Patient verbalized understanding when she called today.

## 2018-05-10 MED ORDER — MYFORTIC 180 MG TABLET,DELAYED RELEASE
ORAL_TABLET | Freq: Two times a day (BID) | ORAL | 11 refills | 0.00000 days | Status: CP
Start: 2018-05-10 — End: 2018-05-10
  Filled 2018-07-02: qty 120, 30d supply, fill #0

## 2018-05-10 MED ORDER — MYFORTIC 180 MG TABLET,DELAYED RELEASE: tablet | 11 refills | 0 days | Status: AC

## 2018-05-15 DIAGNOSIS — Z940 Kidney transplant status: Secondary | ICD-10-CM | POA: Diagnosis not present

## 2018-05-15 DIAGNOSIS — Z944 Liver transplant status: Secondary | ICD-10-CM | POA: Diagnosis not present

## 2018-05-15 LAB — TACROLIMUS BLOOD: Lab: 5.6

## 2018-05-16 LAB — COMPREHENSIVE METABOLIC PANEL
A/G RATIO: 1.8 (ref 1.2–2.2)
ALBUMIN: 4.1 g/dL (ref 3.6–4.8)
ALKALINE PHOSPHATASE: 71 IU/L (ref 39–117)
ALT (SGPT): 14 IU/L (ref 0–32)
AST (SGOT): 13 IU/L (ref 0–40)
BILIRUBIN TOTAL: 0.3 mg/dL (ref 0.0–1.2)
BUN / CREAT RATIO: 27 (ref 12–28)
CALCIUM: 9 mg/dL (ref 8.7–10.3)
CHLORIDE: 106 mmol/L (ref 96–106)
CO2: 23 mmol/L (ref 20–29)
CREATININE: 0.79 mg/dL (ref 0.57–1.00)
GFR MDRD AF AMER: 93 mL/min/{1.73_m2}
GFR MDRD NON AF AMER: 80 mL/min/{1.73_m2}
GLOBULIN, TOTAL: 2.3 g/dL (ref 1.5–4.5)
POTASSIUM: 4.7 mmol/L (ref 3.5–5.2)
TOTAL PROTEIN: 6.4 g/dL (ref 6.0–8.5)

## 2018-05-16 LAB — NEUTROPHILS RELATIVE PERCENT: Lab: 58

## 2018-05-16 LAB — CBC W/ DIFFERENTIAL
BANDED NEUTROPHILS ABSOLUTE COUNT: 0 10*3/uL (ref 0.0–0.1)
BASOPHILS RELATIVE PERCENT: 0 %
EOSINOPHILS ABSOLUTE COUNT: 0.2 10*3/uL (ref 0.0–0.4)
EOSINOPHILS RELATIVE PERCENT: 3 %
HEMATOCRIT: 36.6 % (ref 34.0–46.6)
HEMOGLOBIN: 11.3 g/dL (ref 11.1–15.9)
IMMATURE GRANULOCYTES: 0 %
LYMPHOCYTES ABSOLUTE COUNT: 1.9 10*3/uL (ref 0.7–3.1)
LYMPHOCYTES RELATIVE PERCENT: 28 %
MEAN CORPUSCULAR HEMOGLOBIN CONC: 30.9 g/dL — ABNORMAL LOW (ref 31.5–35.7)
MEAN CORPUSCULAR HEMOGLOBIN: 24.6 pg — ABNORMAL LOW (ref 26.6–33.0)
MEAN CORPUSCULAR VOLUME: 80 fL (ref 79–97)
MONOCYTES RELATIVE PERCENT: 11 %
NEUTROPHILS ABSOLUTE COUNT: 4 10*3/uL (ref 1.4–7.0)
PLATELET COUNT: 325 10*3/uL (ref 150–450)
RED BLOOD CELL COUNT: 4.6 x10E6/uL (ref 3.77–5.28)
RED CELL DISTRIBUTION WIDTH: 16 % — ABNORMAL HIGH (ref 12.3–15.4)
WHITE BLOOD CELL COUNT: 6.9 10*3/uL (ref 3.4–10.8)

## 2018-05-16 LAB — PHOSPHORUS, SERUM: Lab: 3.4

## 2018-05-16 LAB — BILIRUBIN DIRECT: Lab: 0.08

## 2018-05-16 LAB — MAGNESIUM: Lab: 1.5 — ABNORMAL LOW

## 2018-05-16 LAB — SODIUM: Lab: 143

## 2018-05-16 LAB — GAMMA GLUTAMYL TRANSFERASE: Lab: 30

## 2018-05-16 NOTE — Unmapped (Signed)
Per NP Martin-Velez Tac goal 5-7 due to headaches.

## 2018-05-17 ENCOUNTER — Other Ambulatory Visit (INDEPENDENT_AMBULATORY_CARE_PROVIDER_SITE_OTHER): Payer: Self-pay | Admitting: *Deleted

## 2018-05-17 DIAGNOSIS — Z86010 Personal history of colonic polyps: Secondary | ICD-10-CM

## 2018-05-18 LAB — TACROLIMUS BLOOD: Lab: 7

## 2018-05-22 DIAGNOSIS — Z940 Kidney transplant status: Secondary | ICD-10-CM | POA: Diagnosis not present

## 2018-05-22 DIAGNOSIS — Z944 Liver transplant status: Secondary | ICD-10-CM | POA: Diagnosis not present

## 2018-05-22 NOTE — Unmapped (Addendum)
Daughter called stating [facility] needs additional orders.  Sent updated orders.  Asked if patient can go every other week. Let her know I would have to see what today's labs results look like and will call her latter this week to know.    Per NP Martin-Velez patient okayed to decrease to every other week labs. Patient verbalized understanding.

## 2018-05-23 LAB — COMPREHENSIVE METABOLIC PANEL
A/G RATIO: 1.8 (ref 1.2–2.2)
ALBUMIN: 4.1 g/dL (ref 3.6–4.8)
ALKALINE PHOSPHATASE: 67 IU/L (ref 39–117)
ALT (SGPT): 16 IU/L (ref 0–32)
AST (SGOT): 16 IU/L (ref 0–40)
BILIRUBIN TOTAL: <0.2 mg/dL (ref 0.0–1.2)
BLOOD UREA NITROGEN: 18 mg/dL (ref 8–27)
BUN / CREAT RATIO: 27 (ref 12–28)
CALCIUM: 9.3 mg/dL (ref 8.7–10.3)
CHLORIDE: 104 mmol/L (ref 96–106)
CO2: 24 mmol/L (ref 20–29)
GFR MDRD NON AF AMER: 95 mL/min/{1.73_m2}
GLOBULIN, TOTAL: 2.3 g/dL (ref 1.5–4.5)
GLUCOSE: 99 mg/dL (ref 65–99)
POTASSIUM: 4.8 mmol/L (ref 3.5–5.2)
TOTAL PROTEIN: 6.4 g/dL (ref 6.0–8.5)

## 2018-05-23 LAB — RED CELL DISTRIBUTION WIDTH: Lab: 15.1

## 2018-05-23 LAB — CBC W/ DIFFERENTIAL
BANDED NEUTROPHILS ABSOLUTE COUNT: 0 10*3/uL (ref 0.0–0.1)
BASOPHILS ABSOLUTE COUNT: 0 10*3/uL (ref 0.0–0.2)
BASOPHILS RELATIVE PERCENT: 1 %
EOSINOPHILS ABSOLUTE COUNT: 0.2 10*3/uL (ref 0.0–0.4)
EOSINOPHILS RELATIVE PERCENT: 3 %
HEMATOCRIT: 36.1 % (ref 34.0–46.6)
HEMOGLOBIN: 11 g/dL — ABNORMAL LOW (ref 11.1–15.9)
IMMATURE GRANULOCYTES: 1 %
LYMPHOCYTES ABSOLUTE COUNT: 1.4 10*3/uL (ref 0.7–3.1)
LYMPHOCYTES RELATIVE PERCENT: 23 %
MEAN CORPUSCULAR HEMOGLOBIN CONC: 30.5 g/dL — ABNORMAL LOW (ref 31.5–35.7)
MONOCYTES ABSOLUTE COUNT: 0.5 10*3/uL (ref 0.1–0.9)
MONOCYTES RELATIVE PERCENT: 9 %
NEUTROPHILS ABSOLUTE COUNT: 3.9 10*3/uL (ref 1.4–7.0)
NEUTROPHILS RELATIVE PERCENT: 63 %
PLATELET COUNT: 323 10*3/uL (ref 150–450)
RED BLOOD CELL COUNT: 4.48 x10E6/uL (ref 3.77–5.28)
RED CELL DISTRIBUTION WIDTH: 15.1 % (ref 12.3–15.4)
WHITE BLOOD CELL COUNT: 6 10*3/uL (ref 3.4–10.8)

## 2018-05-23 LAB — BILIRUBIN DIRECT: Lab: 0.08

## 2018-05-23 LAB — PHOSPHORUS, SERUM: Lab: 2.4 — ABNORMAL LOW

## 2018-05-23 LAB — TACROLIMUS BLOOD: Lab: 7.3

## 2018-05-23 LAB — MAGNESIUM: Lab: 1.7

## 2018-05-23 LAB — CHLORIDE: Lab: 104

## 2018-05-23 LAB — GAMMA GLUTAMYL TRANSFERASE: Lab: 33

## 2018-05-23 MED ORDER — PREDNISONE 5 MG TABLET
ORAL_TABLET | Freq: Every day | ORAL | 11 refills | 0.00000 days | Status: CP
Start: 2018-05-23 — End: 2018-05-23

## 2018-05-23 MED ORDER — PREDNISONE 5 MG TABLET: 5 mg | tablet | 11 refills | 0 days

## 2018-05-23 NOTE — Unmapped (Addendum)
7/31 update: filling meds today and still no payment on acct. I called patient and she states she is unable to pay until Fri 8/2. I let her know that with a payment on 8/2, we could have meds delivered on Mon 8/5. She checked her supply and said she would have enough of all meds to last until then. I verified twice with her that she WILL have enough meds to get through for a 8/5 shipment. So adjusting date for 8/2 fill.Michelle Travis      [facility] Refill and Clinical Coordination Note  Medication(s): MYFORTIC, PREDNISONE    Michelle Travis, DOB: 1956/02/18  Phone: [phone] (home) [phone] (work), Alternate phone contact: N/A  Shipping address: [address]  [place] [address]  Phone or address changes today?: No  All above HIPAA information verified.  Insurance changes? No    Completed refill and clinical call assessment today to schedule patient's medication shipment from the [facility] [phone]).      MEDICATION RECONCILIATION    Confirmed the medication and dosage are correct and have not changed: No, patient reports changes to the regimen as follows: PREDNISONE IS NOW 1 DAILY  - WILL REQ NEW RX    Were there any changes to your medication(s) in the past month:  No, there are no changes reported at this time.    ADHERENCE    Is this medicine transplant or covered by Medicare Part B? Yes.    Myfortic 180 mg   Quantity filled last month: 120   # of tablets left on hand: 8 DAYS LEFT  Prednisone 5 mg   Quantity filled last month: 60   # of tablets left on hand: 10 DAYS LEFT    Did you miss any doses in the past 4 weeks? No missed doses reported.  Adherence counseling provided? Not needed     SIDE EFFECT MANAGEMENT    Are you tolerating your medication?:  Michelle Travis reports tolerating the medication.  Side effect management discussed: None      Therapy is appropriate and should be continued.    Evidence of clinical benefit: See Epic note from 03/2618      [facility] Delivery Scheduled: Yes, Expected medication delivery date: 05/30/18 VIA UPS - however patient can't pay bill until that day - which would mean we couldn't deliver until 8/5 and pt would be out- K note added  to see what happens with billing  Additional medications refilled: No additional medications/refills needed at this time.    The patient will receive an FSI print out for each medication shipped and additional FDA Medication Guides as required.  Patient education from [name] or Michelle Travis may also be included in the shipment.    Michelle Travis did not have any additional questions at this time.    Delivery address validated in FSI scheduling system: Yes, address listed above is correct.      We will follow up with patient monthly for standard refill processing and delivery.      Thank you,  Michelle Travis   [facility] Specialty Pharmacist

## 2018-05-29 MED ORDER — PREDNISONE 5 MG TABLET: 5 mg | tablet | Freq: Every day | 11 refills | 0 days | Status: AC

## 2018-05-29 MED ORDER — PREDNISONE 5 MG TABLET
ORAL_TABLET | Freq: Every day | ORAL | 11 refills | 0.00000 days | Status: CP
Start: 2018-05-29 — End: 2018-07-08
  Filled 2018-07-02: qty 30, 30d supply, fill #0

## 2018-05-30 NOTE — Unmapped (Signed)
Current Outpatient Medications on File Prior to Visit   Medication Sig   ??? acetaminophen (TYLENOL) 325 MG tablet Take 1-2 tablets (325-650 mg total) by mouth every six (6) hours as needed for pain.   ??? aspirin (ECOTRIN) 81 MG tablet Take 1 tablet (81 mg total) by mouth daily.   ??? atorvastatin (LIPITOR) 10 MG tablet Take 1 tablet (10 mg total) by mouth daily.   ??? cholecalciferol, vitamin D3, (CHOLECALCIFEROL) 1,000 unit tablet Take 2 tablets (2,000 Units total) by mouth daily.   ??? ENVARSUS XR 4 mg Tb24 extended release tablet Take 8 mg by mouth daily. 7 mg daily (one 4 mg tablet and three 1 mg tablet)   ??? MYFORTIC 180 mg EC tablet Take 2 tablets (360 mg total) by mouth Two (2) times a day.   ??? pantoprazole (PROTONIX) 40 MG tablet Take 1 tablet (40 mg total) by mouth daily as needed.   ??? predniSONE (DELTASONE) 5 MG tablet Take 1 tablet (5 mg total) by mouth daily.   ??? tbo-filgrastim (GRANIX) 300 mcg/0.5 mL Syrg injection Take directed (Patient not taking: Reported on 04/24/2018)     No current facility-administered medications on file prior to visit.

## 2018-05-31 NOTE — Unmapped (Signed)
Current Outpatient Medications on File Prior to Visit   Medication Sig   ??? acetaminophen (TYLENOL) 325 MG tablet Take 1-2 tablets (325-650 mg total) by mouth every six (6) hours as needed for pain.   ??? aspirin (ECOTRIN) 81 MG tablet Take 1 tablet (81 mg total) by mouth daily.   ??? atorvastatin (LIPITOR) 10 MG tablet Take 1 tablet (10 mg total) by mouth daily.   ??? cholecalciferol, vitamin D3, (CHOLECALCIFEROL) 1,000 unit tablet Take 2 tablets (2,000 Units total) by mouth daily.   ??? ENVARSUS XR 4 mg Tb24 extended release tablet Take 8 mg by mouth daily. 7 mg daily (one 4 mg tablet and three 1 mg tablet)   ??? MYFORTIC 180 mg EC tablet Take 2 tablets (360 mg total) by mouth Two (2) times a day.   ??? pantoprazole (PROTONIX) 40 MG tablet Take 1 tablet (40 mg total) by mouth daily as needed.   ??? predniSONE (DELTASONE) 5 MG tablet Take 1 tablet (5 mg total) by mouth daily.   ??? tbo-filgrastim (GRANIX) 300 mcg/0.5 mL Syrg injection Take directed (Patient not taking: Reported on 04/24/2018)     No current facility-administered medications on file prior to visit.      Allergies   Allergen Reactions   ??? Latex Swelling and Rash     Discoloration of skin.   Discoloration of skin.    ??? Hydrocodone Itching   ??? Hydrocodone-Acetaminophen Itching

## 2018-06-03 MED FILL — ASPIRIN LOW DOSE/81MG EC/TBEC: ASPIRIN LOW DOSE/81MG EC/TBEC | 30 days supply | Qty: 30 | Fill #6

## 2018-06-03 MED FILL — PANTOPRAZOLE/40MG/TBEC: PANTOPRAZOLE/40MG/TBEC | 30 days supply | Qty: 30 | Fill #7

## 2018-06-03 MED FILL — PREDNISONE/5MG/TABS: PREDNISONE/5MG/TABS | 30 days supply | Qty: 30 | Fill #0

## 2018-06-03 MED FILL — MYFORTIC/180MG/TAB: MYFORTIC/180MG/TAB | 30 days supply | Qty: 120 | Fill #4

## 2018-06-05 DIAGNOSIS — K769 Liver disease, unspecified: Secondary | ICD-10-CM | POA: Diagnosis not present

## 2018-06-05 DIAGNOSIS — Z944 Liver transplant status: Secondary | ICD-10-CM | POA: Diagnosis not present

## 2018-06-05 DIAGNOSIS — Z79899 Other long term (current) drug therapy: Secondary | ICD-10-CM | POA: Diagnosis not present

## 2018-06-06 LAB — COMPREHENSIVE METABOLIC PANEL
A/G RATIO: 1.9 (ref 1.2–2.2)
ALBUMIN: 4.3 g/dL (ref 3.6–4.8)
ALKALINE PHOSPHATASE: 82 IU/L (ref 39–117)
ALT (SGPT): 24 IU/L (ref 0–32)
AST (SGOT): 18 IU/L (ref 0–40)
BILIRUBIN TOTAL: <0.2 mg/dL (ref 0.0–1.2)
BLOOD UREA NITROGEN: 18 mg/dL (ref 8–27)
BUN / CREAT RATIO: 24 (ref 12–28)
CHLORIDE: 104 mmol/L (ref 96–106)
CO2: 24 mmol/L (ref 20–29)
GLOBULIN, TOTAL: 2.3 g/dL (ref 1.5–4.5)
GLUCOSE: 98 mg/dL (ref 65–99)
POTASSIUM: 4.9 mmol/L (ref 3.5–5.2)
TOTAL PROTEIN: 6.6 g/dL (ref 6.0–8.5)

## 2018-06-06 LAB — CBC W/ DIFFERENTIAL
BASOPHILS ABSOLUTE COUNT: 0 10*3/uL (ref 0.0–0.2)
BASOPHILS RELATIVE PERCENT: 0 %
EOSINOPHILS ABSOLUTE COUNT: 0.2 10*3/uL (ref 0.0–0.4)
EOSINOPHILS RELATIVE PERCENT: 3 %
HEMATOCRIT: 36.8 % (ref 34.0–46.6)
HEMOGLOBIN: 11.5 g/dL (ref 11.1–15.9)
IMMATURE GRANULOCYTES: 0 %
LYMPHOCYTES ABSOLUTE COUNT: 1.7 10*3/uL (ref 0.7–3.1)
LYMPHOCYTES RELATIVE PERCENT: 25 %
MEAN CORPUSCULAR HEMOGLOBIN CONC: 31.3 g/dL — ABNORMAL LOW (ref 31.5–35.7)
MEAN CORPUSCULAR HEMOGLOBIN: 24.9 pg — ABNORMAL LOW (ref 26.6–33.0)
MEAN CORPUSCULAR VOLUME: 80 fL (ref 79–97)
MONOCYTES ABSOLUTE COUNT: 0.7 10*3/uL (ref 0.1–0.9)
MONOCYTES RELATIVE PERCENT: 10 %
NEUTROPHILS ABSOLUTE COUNT: 4.3 10*3/uL (ref 1.4–7.0)
NEUTROPHILS RELATIVE PERCENT: 62 %
RED BLOOD CELL COUNT: 4.61 x10E6/uL (ref 3.77–5.28)
RED CELL DISTRIBUTION WIDTH: 16.6 % — ABNORMAL HIGH (ref 12.3–15.4)
WHITE BLOOD CELL COUNT: 6.9 10*3/uL (ref 3.4–10.8)

## 2018-06-06 LAB — GAMMA GLUTAMYL TRANSFERASE: Lab: 120 — ABNORMAL HIGH

## 2018-06-06 LAB — MAGNESIUM: Lab: 1.5 — ABNORMAL LOW

## 2018-06-06 LAB — MEAN CORPUSCULAR HEMOGLOBIN CONC: Lab: 31.3 — ABNORMAL LOW

## 2018-06-06 LAB — BILIRUBIN DIRECT: Lab: 0.08

## 2018-06-06 LAB — GAMMA GT: GAMMA GLUTAMYL TRANSFERASE: 120 IU/L — ABNORMAL HIGH (ref 0–60)

## 2018-06-06 LAB — BILIRUBIN TOTAL: Lab: <0.2

## 2018-06-06 LAB — PHOSPHORUS, SERUM: Lab: 3.1

## 2018-06-06 NOTE — Unmapped (Signed)
Called patient about her elevated GGT.  Patient reports she is tired.  Patient now 191.2 pounds (7 pound weight gain in 2 months).  Weight gain around the middle. Denied any new medications, tobacco, or alcohol.      Reports she craves frozen pizzas with peppers and onions, chips, baked chicken, fried chicken, fried fish, and bacon.  Advised patient to cut out of fatty foods including fried food, bacon, and chips.  Also advised her to start exercising 20 minutes a day.  She verbalized understanding.   Messaged NP Martin-Velez to see if any additional intervention is needed at this time.     Per NP Martin-Velez patient to repeat labs on Monday given this outlier.  Left VM for patient and daughter confirmed she would get labs.

## 2018-06-07 NOTE — Unmapped (Signed)
Epic [facility]) medication reconciliation is completed.

## 2018-06-08 LAB — TACROLIMUS BLOOD: Lab: 7.4

## 2018-06-10 DIAGNOSIS — K769 Liver disease, unspecified: Secondary | ICD-10-CM | POA: Diagnosis not present

## 2018-06-10 DIAGNOSIS — Z944 Liver transplant status: Secondary | ICD-10-CM | POA: Diagnosis not present

## 2018-06-10 DIAGNOSIS — Z79899 Other long term (current) drug therapy: Secondary | ICD-10-CM | POA: Diagnosis not present

## 2018-06-11 LAB — CBC W/ DIFFERENTIAL
BASOPHILS ABSOLUTE COUNT: 0 10*3/uL (ref 0.0–0.2)
BASOPHILS RELATIVE PERCENT: 1 %
EOSINOPHILS ABSOLUTE COUNT: 0.2 10*3/uL (ref 0.0–0.4)
HEMATOCRIT: 37.2 % (ref 34.0–46.6)
HEMOGLOBIN: 11.8 g/dL (ref 11.1–15.9)
IMMATURE GRANULOCYTES: 0 %
LYMPHOCYTES ABSOLUTE COUNT: 1.5 10*3/uL (ref 0.7–3.1)
LYMPHOCYTES RELATIVE PERCENT: 23 %
MEAN CORPUSCULAR HEMOGLOBIN CONC: 31.7 g/dL (ref 31.5–35.7)
MEAN CORPUSCULAR HEMOGLOBIN: 25.5 pg — ABNORMAL LOW (ref 26.6–33.0)
MEAN CORPUSCULAR VOLUME: 81 fL (ref 79–97)
MONOCYTES ABSOLUTE COUNT: 0.7 10*3/uL (ref 0.1–0.9)
MONOCYTES RELATIVE PERCENT: 11 %
NEUTROPHILS ABSOLUTE COUNT: 4.3 10*3/uL (ref 1.4–7.0)
NEUTROPHILS RELATIVE PERCENT: 63 %
PLATELET COUNT: 279 10*3/uL (ref 150–450)
RED BLOOD CELL COUNT: 4.62 x10E6/uL (ref 3.77–5.28)
RED CELL DISTRIBUTION WIDTH: 16.2 % — ABNORMAL HIGH (ref 12.3–15.4)
WHITE BLOOD CELL COUNT: 6.7 10*3/uL (ref 3.4–10.8)

## 2018-06-11 LAB — COMPREHENSIVE METABOLIC PANEL
A/G RATIO: 2.1 (ref 1.2–2.2)
ALKALINE PHOSPHATASE: 75 IU/L (ref 39–117)
ALT (SGPT): 16 IU/L (ref 0–32)
AST (SGOT): 18 IU/L (ref 0–40)
BILIRUBIN TOTAL: 0.3 mg/dL (ref 0.0–1.2)
BLOOD UREA NITROGEN: 20 mg/dL (ref 8–27)
BUN / CREAT RATIO: 25 (ref 12–28)
CALCIUM: 9.5 mg/dL (ref 8.7–10.3)
CHLORIDE: 106 mmol/L (ref 96–106)
CO2: 21 mmol/L (ref 20–29)
CREATININE: 0.81 mg/dL (ref 0.57–1.00)
GLOBULIN, TOTAL: 2.1 g/dL (ref 1.5–4.5)
GLUCOSE: 100 mg/dL — ABNORMAL HIGH (ref 65–99)
POTASSIUM: 4.7 mmol/L (ref 3.5–5.2)
SODIUM: 144 mmol/L (ref 134–144)

## 2018-06-11 LAB — PHOSPHORUS, SERUM: Lab: 2.8

## 2018-06-11 LAB — MAGNESIUM: Lab: 1.4 — ABNORMAL LOW

## 2018-06-11 LAB — A/G RATIO: Lab: 2.1

## 2018-06-11 LAB — GAMMA GLUTAMYL TRANSFERASE: Lab: 79 — ABNORMAL HIGH

## 2018-06-11 LAB — BILIRUBIN DIRECT: Lab: 0.1

## 2018-06-11 LAB — LYMPHOCYTES ABSOLUTE COUNT: Lab: 1.5

## 2018-06-11 LAB — TACROLIMUS BLOOD: Lab: 10

## 2018-06-11 NOTE — Unmapped (Signed)
Let patient know LFTs are good and her GGT is trending back down.

## 2018-06-11 NOTE — Unmapped (Signed)
Patient has been taking 8mg  envarsus. She still reports some intermittent HA. All recent levels have been greater than a trough of 7, with most recent level 10.   Will decrease to 7mg  tablets as of 06/12/2018 goal (5-7).    Updated MAR to reflect change.         Gertie Fey, DNP, APRN, FNP-C  [facility]  [address]  [place], [place]  [address]

## 2018-06-12 MED ORDER — ENVARSUS XR 4 MG TABLET,EXTENDED RELEASE
ORAL_TABLET | Freq: Every day | ORAL | 11 refills | 0 days
Start: 2018-06-12 — End: 2018-07-26

## 2018-06-14 NOTE — Unmapped (Deleted)
TRANSPLANT SURGERY PROGRESS NOTE    Assessment and Plan  ISLAND DOHMEN is a 62 y.o. 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  ??  Tacrolimus goal 5-7, HA, on envarsus 7mg  daily. 360mg  MMF.    Stop pred?    She is clinically doing very well. CBC WNL. Tac trough last week was low at 1.3; dose was increased and trough at 6.2 on 5/15. Weight gain is concerning for Cushing's from prednisone or ascites from previous portal vein stricture or hypothyroidism.   ??  Wt Readings from Last 6 Encounters:   04/24/18 83.5 kg (184 lb)   03/18/18 83.9 kg (185 lb)   03/18/18 83.9 kg (185 lb)   03/18/18 83.9 kg (185 lb)   12/17/17 73.5 kg (162 lb)   12/17/17 73.7 kg (162 lb 7.7 oz)     Hypoattenuating thyroid nodule noted on chest CT 11/11/2016, thyroid levels 02/2018 unrevealing.  GLP-1?    - Lab frequency: weekly    Subjective  EPIFANIA LITTRELL is a 62 y.o. female who is s/p combined liver/kidney transplant 09/13/2017.     She denies N/V, abdominal pain, dysuria, or medication side effects. Her only complaint today is weight gain, as she has put on around 30 pounds since her surgery. She states her appetite is really big and she is constantly eating. She does not believe this feels the same as when she was volume-up with kidney failure.       Objective    There were no vitals filed for this visit.   There is no height or weight on file to calculate BMI.     Physical Exam:    General Appearance:   No acute distress  Lungs:                Clear to auscultation bilaterally  Heart:                           Regular rate and rhythm  Abdomen:                Soft, non-tender, non-distended  Extremities:              Warm and well perfused      Data Review:  All lab results last 24 hours:  No results found for this or any previous visit (from the past 24 hour(s)). _________________________________________    Gertie Fey, DNP, APRN, FNP-C  [facility] for [facility]  [address]  [place], [place]  [address]

## 2018-06-17 ENCOUNTER — Ambulatory Visit
Admit: 2018-06-17 | Discharge: 2018-06-18 | Payer: MEDICARE | Attending: Pharmacist Clinician (PhC)/ Clinical Pharmacy Specialist | Primary: Pharmacist Clinician (PhC)/ Clinical Pharmacy Specialist

## 2018-06-17 ENCOUNTER — Ambulatory Visit: Admit: 2018-06-17 | Discharge: 2018-06-18 | Payer: MEDICARE | Attending: Registered" | Primary: Registered"

## 2018-06-17 DIAGNOSIS — Z944 Liver transplant status: Principal | ICD-10-CM

## 2018-06-17 NOTE — Unmapped (Signed)
On call page received from Michelle Travis's daughter who states Monday's appt will need to be cancelled d/t a familial medical emergency (daughter with ruptured appendix). Assured her I would make team members aware, and advised she should contact primary coordinator once things are settled to reschedule.

## 2018-06-18 NOTE — Unmapped (Signed)
Pt cancelled appt today- will f/u with rescheduling.

## 2018-06-19 DIAGNOSIS — Z79899 Other long term (current) drug therapy: Secondary | ICD-10-CM | POA: Diagnosis not present

## 2018-06-19 DIAGNOSIS — K769 Liver disease, unspecified: Secondary | ICD-10-CM | POA: Diagnosis not present

## 2018-06-19 DIAGNOSIS — Z944 Liver transplant status: Secondary | ICD-10-CM | POA: Diagnosis not present

## 2018-06-19 NOTE — Unmapped (Signed)
Per NP Martin-Velez patient to repeat labs today and she verbalized understanding.

## 2018-06-20 LAB — COMPREHENSIVE METABOLIC PANEL
A/G RATIO: 2 (ref 1.2–2.2)
ALKALINE PHOSPHATASE: 75 IU/L (ref 39–117)
ALT (SGPT): 16 IU/L (ref 0–32)
AST (SGOT): 19 IU/L (ref 0–40)
BILIRUBIN TOTAL: 0.3 mg/dL (ref 0.0–1.2)
BLOOD UREA NITROGEN: 15 mg/dL (ref 8–27)
BUN / CREAT RATIO: 21 (ref 12–28)
CALCIUM: 9.3 mg/dL (ref 8.7–10.3)
CHLORIDE: 106 mmol/L (ref 96–106)
CO2: 25 mmol/L (ref 20–29)
CREATININE: 0.73 mg/dL (ref 0.57–1.00)
GLOBULIN, TOTAL: 2.2 g/dL (ref 1.5–4.5)
GLUCOSE: 97 mg/dL (ref 65–99)
SODIUM: 144 mmol/L (ref 134–144)
TOTAL PROTEIN: 6.5 g/dL (ref 6.0–8.5)

## 2018-06-20 LAB — MAGNESIUM: Lab: 1.5 — ABNORMAL LOW

## 2018-06-20 LAB — CBC W/ DIFFERENTIAL
BASOPHILS ABSOLUTE COUNT: 0 10*3/uL (ref 0.0–0.2)
BASOPHILS RELATIVE PERCENT: 1 %
EOSINOPHILS ABSOLUTE COUNT: 0.2 10*3/uL (ref 0.0–0.4)
EOSINOPHILS RELATIVE PERCENT: 3 %
HEMATOCRIT: 35.7 % (ref 34.0–46.6)
IMMATURE GRANULOCYTES: 0 %
LYMPHOCYTES ABSOLUTE COUNT: 1.6 10*3/uL (ref 0.7–3.1)
LYMPHOCYTES RELATIVE PERCENT: 25 %
MEAN CORPUSCULAR HEMOGLOBIN: 24.7 pg — ABNORMAL LOW (ref 26.6–33.0)
MEAN CORPUSCULAR VOLUME: 80 fL (ref 79–97)
MONOCYTES ABSOLUTE COUNT: 0.6 10*3/uL (ref 0.1–0.9)
MONOCYTES RELATIVE PERCENT: 10 %
NEUTROPHILS ABSOLUTE COUNT: 3.7 10*3/uL (ref 1.4–7.0)
NEUTROPHILS RELATIVE PERCENT: 61 %
PLATELET COUNT: 317 10*3/uL (ref 150–450)
RED BLOOD CELL COUNT: 4.45 x10E6/uL (ref 3.77–5.28)
RED CELL DISTRIBUTION WIDTH: 16.3 % — ABNORMAL HIGH (ref 12.3–15.4)
WHITE BLOOD CELL COUNT: 6.1 10*3/uL (ref 3.4–10.8)

## 2018-06-20 LAB — PHOSPHORUS, SERUM: Lab: 2.5

## 2018-06-20 LAB — GAMMA GLUTAMYL TRANSFERASE: Lab: 56

## 2018-06-20 LAB — CO2: Lab: 25

## 2018-06-20 LAB — BILIRUBIN DIRECT: Lab: 0.13

## 2018-06-20 LAB — EOSINOPHILS RELATIVE PERCENT: Lab: 3

## 2018-06-21 LAB — TACROLIMUS BLOOD: Lab: 8.6

## 2018-06-25 NOTE — Unmapped (Signed)
Received on call page from patient requesting clarification on if she should have labs drawn this week - given next appointment is on 9/9 - patient will repeat labs again this week and then again @ visit on 9/9 (every other week).

## 2018-06-26 DIAGNOSIS — Z79899 Other long term (current) drug therapy: Secondary | ICD-10-CM | POA: Diagnosis not present

## 2018-06-26 DIAGNOSIS — Z944 Liver transplant status: Secondary | ICD-10-CM | POA: Diagnosis not present

## 2018-06-26 DIAGNOSIS — K769 Liver disease, unspecified: Secondary | ICD-10-CM | POA: Diagnosis not present

## 2018-06-27 LAB — BILIRUBIN DIRECT: Lab: 0.13

## 2018-06-27 LAB — COMPREHENSIVE METABOLIC PANEL
A/G RATIO: 1.8 (ref 1.2–2.2)
ALBUMIN: 4.2 g/dL (ref 3.6–4.8)
ALKALINE PHOSPHATASE: 113 IU/L (ref 39–117)
AST (SGOT): 37 IU/L (ref 0–40)
BILIRUBIN TOTAL: 0.3 mg/dL (ref 0.0–1.2)
BLOOD UREA NITROGEN: 23 mg/dL (ref 8–27)
BUN / CREAT RATIO: 24 (ref 12–28)
CALCIUM: 9 mg/dL (ref 8.7–10.3)
CREATININE: 0.95 mg/dL (ref 0.57–1.00)
GLUCOSE: 99 mg/dL (ref 65–99)
POTASSIUM: 4.6 mmol/L (ref 3.5–5.2)
SODIUM: 142 mmol/L (ref 134–144)
TOTAL PROTEIN: 6.5 g/dL (ref 6.0–8.5)

## 2018-06-27 LAB — CBC W/ DIFFERENTIAL
BANDED NEUTROPHILS ABSOLUTE COUNT: 0 10*3/uL (ref 0.0–0.1)
BASOPHILS ABSOLUTE COUNT: 0 10*3/uL (ref 0.0–0.2)
BASOPHILS RELATIVE PERCENT: 0 %
EOSINOPHILS ABSOLUTE COUNT: 0.2 10*3/uL (ref 0.0–0.4)
HEMATOCRIT: 37 % (ref 34.0–46.6)
HEMOGLOBIN: 11.2 g/dL (ref 11.1–15.9)
IMMATURE GRANULOCYTES: 0 %
LYMPHOCYTES ABSOLUTE COUNT: 1.7 10*3/uL (ref 0.7–3.1)
LYMPHOCYTES RELATIVE PERCENT: 23 %
MEAN CORPUSCULAR HEMOGLOBIN CONC: 30.3 g/dL — ABNORMAL LOW (ref 31.5–35.7)
MEAN CORPUSCULAR HEMOGLOBIN: 24.6 pg — ABNORMAL LOW (ref 26.6–33.0)
MEAN CORPUSCULAR VOLUME: 81 fL (ref 79–97)
MONOCYTES ABSOLUTE COUNT: 0.7 10*3/uL (ref 0.1–0.9)
MONOCYTES RELATIVE PERCENT: 10 %
NEUTROPHILS RELATIVE PERCENT: 65 %
PLATELET COUNT: 325 10*3/uL (ref 150–450)
RED BLOOD CELL COUNT: 4.55 x10E6/uL (ref 3.77–5.28)
RED CELL DISTRIBUTION WIDTH: 16.4 % — ABNORMAL HIGH (ref 12.3–15.4)
WHITE BLOOD CELL COUNT: 7.2 10*3/uL (ref 3.4–10.8)

## 2018-06-27 LAB — GAMMA GLUTAMYL TRANSFERASE: Lab: 191 — ABNORMAL HIGH

## 2018-06-27 LAB — MAGNESIUM: Lab: 1.4 — ABNORMAL LOW

## 2018-06-27 LAB — MEAN CORPUSCULAR HEMOGLOBIN CONC: Lab: 30.3 — ABNORMAL LOW

## 2018-06-27 LAB — PHOSPHORUS, SERUM: Lab: 3.9

## 2018-06-27 LAB — BUN / CREAT RATIO: Lab: 24

## 2018-06-27 NOTE — Unmapped (Signed)
[facility] Refill Coordination Note    Specialty Medication(s) to be Shipped:   Transplant: Myfortic 180mg   Other medication(s) to be shipped: Aspirin 81mg , Prednisone 5mg  & Pantoprazole 40mg      Michelle Travis, DOB: Feb 28, 1956  Phone: [phone] (home) [phone] (work)  Shipping Address: [address]  [place] [address]    All above HIPAA information was verified with patient.     Completed refill call assessment today to schedule patient's medication shipment from the [facility] [phone]).       Specialty medication(s) and dose(s) confirmed: Regimen is correct and unchanged.   Changes to medications: Michelle Travis reports starting the following medications: Vit D  Changes to insurance: No  Questions for the pharmacist: No    The patient will receive a drug information handout for each medication shipped and additional FDA Medication Guides as required.      DISEASE/MEDICATION-SPECIFIC INFORMATION        N/A    ADHERENCE     Medication Adherence    Patient reported X missed doses in the last month:  0         MEDICARE PART B DOCUMENTATION     Myfortic 180mg : Patient has 7 days worth of tablets on hand.    SHIPPING     Shipping address confirmed in Epic.     Delivery Scheduled: Yes, Expected medication delivery date: 07/03/2018 via UPS or courier.     Michelle Travis   [facility] Specialty Technician

## 2018-06-28 NOTE — Unmapped (Signed)
Per discussion with PharmD Nedra Hai and NP Renee Rival about concerning LFT increase patient to repeat labs on Tuesday 07/02/18 next week with HCV RNA (due to donor hx), CMV PCR, and her other standard labs.  Called patient had to leave VM requesting repeat labs on Tuesday next week and requested call back to discuss labs. Spoke with daughter and she verbalized understanding.

## 2018-06-29 LAB — TACROLIMUS BLOOD: Lab: 9.9

## 2018-07-02 DIAGNOSIS — K769 Liver disease, unspecified: Secondary | ICD-10-CM | POA: Diagnosis not present

## 2018-07-02 DIAGNOSIS — Z944 Liver transplant status: Secondary | ICD-10-CM | POA: Diagnosis not present

## 2018-07-02 DIAGNOSIS — Z79899 Other long term (current) drug therapy: Secondary | ICD-10-CM | POA: Diagnosis not present

## 2018-07-02 MED FILL — PREDNISONE 5 MG TABLET: 30 days supply | Qty: 30 | Fill #0 | Status: AC

## 2018-07-02 MED FILL — MYFORTIC 180 MG TABLET,DELAYED RELEASE: 30 days supply | Qty: 120 | Fill #0 | Status: AC

## 2018-07-03 LAB — COMPREHENSIVE METABOLIC PANEL
ALKALINE PHOSPHATASE: 82 IU/L (ref 39–117)
ALT (SGPT): 17 IU/L (ref 0–32)
AST (SGOT): 13 IU/L (ref 0–40)
BILIRUBIN TOTAL: <0.2 mg/dL (ref 0.0–1.2)
BLOOD UREA NITROGEN: 21 mg/dL (ref 8–27)
BUN / CREAT RATIO: 28 (ref 12–28)
CALCIUM: 9.1 mg/dL (ref 8.7–10.3)
CHLORIDE: 106 mmol/L (ref 96–106)
CO2: 21 mmol/L (ref 20–29)
CREATININE: 0.75 mg/dL (ref 0.57–1.00)
GLOBULIN, TOTAL: 2.5 g/dL (ref 1.5–4.5)
GLUCOSE: 99 mg/dL (ref 65–99)
POTASSIUM: 4.2 mmol/L (ref 3.5–5.2)
SODIUM: 142 mmol/L (ref 134–144)
TOTAL PROTEIN: 6.5 g/dL (ref 6.0–8.5)

## 2018-07-03 LAB — CBC W/ DIFFERENTIAL
BASOPHILS ABSOLUTE COUNT: 0 10*3/uL (ref 0.0–0.2)
BASOPHILS RELATIVE PERCENT: 0 %
EOSINOPHILS ABSOLUTE COUNT: 0.1 10*3/uL (ref 0.0–0.4)
EOSINOPHILS RELATIVE PERCENT: 2 %
HEMATOCRIT: 36.3 % (ref 34.0–46.6)
HEMOGLOBIN: 10.9 g/dL — ABNORMAL LOW (ref 11.1–15.9)
IMMATURE GRANULOCYTES: 0 %
LYMPHOCYTES ABSOLUTE COUNT: 1.4 10*3/uL (ref 0.7–3.1)
LYMPHOCYTES RELATIVE PERCENT: 17 %
MEAN CORPUSCULAR HEMOGLOBIN: 24.2 pg — ABNORMAL LOW (ref 26.6–33.0)
MEAN CORPUSCULAR VOLUME: 81 fL (ref 79–97)
MONOCYTES ABSOLUTE COUNT: 0.9 10*3/uL (ref 0.1–0.9)
MONOCYTES RELATIVE PERCENT: 11 %
NEUTROPHILS ABSOLUTE COUNT: 5.6 10*3/uL (ref 1.4–7.0)
NEUTROPHILS RELATIVE PERCENT: 70 %
PLATELET COUNT: 333 10*3/uL (ref 150–450)
RED BLOOD CELL COUNT: 4.5 x10E6/uL (ref 3.77–5.28)
RED CELL DISTRIBUTION WIDTH: 15.7 % — ABNORMAL HIGH (ref 12.3–15.4)
WHITE BLOOD CELL COUNT: 8.1 10*3/uL (ref 3.4–10.8)

## 2018-07-03 LAB — MAGNESIUM: Lab: 1.2 — ABNORMAL LOW

## 2018-07-03 LAB — TACROLIMUS BLOOD: Lab: 5.1

## 2018-07-03 LAB — PHOSPHORUS, SERUM: Lab: 2.7

## 2018-07-03 LAB — MEAN CORPUSCULAR HEMOGLOBIN CONC: Lab: 30 — ABNORMAL LOW

## 2018-07-03 LAB — BILIRUBIN DIRECT: Lab: 0.05

## 2018-07-03 LAB — GAMMA GLUTAMYL TRANSFERASE: Lab: 87 — ABNORMAL HIGH

## 2018-07-03 LAB — BLOOD UREA NITROGEN: Lab: 21

## 2018-07-04 ENCOUNTER — Encounter (INDEPENDENT_AMBULATORY_CARE_PROVIDER_SITE_OTHER): Payer: Self-pay | Admitting: *Deleted

## 2018-07-04 ENCOUNTER — Telehealth (INDEPENDENT_AMBULATORY_CARE_PROVIDER_SITE_OTHER): Payer: Self-pay | Admitting: *Deleted

## 2018-07-04 NOTE — Unmapped (Addendum)
Past week has experienced diarhea in the middle of the night about 3  Times a night.  Lab did not draw CMV PCR and HCV RNA as ordered will obtain on 07/08/18 clinic apt. Patient denied any missed doses of Envarsus.  Per NP Martin-Velez educated patient about increase magnesium containing foods in her diet.  Patient verbalized understanding.  Sent message to NP to see if any intervention for low tac level and she replied no intervention at this time.

## 2018-07-04 NOTE — Telephone Encounter (Signed)
Patient needs suprep 

## 2018-07-05 MED ORDER — SUPREP BOWEL PREP KIT 17.5-3.13-1.6 GM/177ML PO SOLN: 1.0000 | Freq: Once | ORAL | 0 refills | Status: AC

## 2018-07-05 MED ORDER — PANTOPRAZOLE 40 MG TABLET,DELAYED RELEASE
ORAL_TABLET | ORAL | PRN refills | 0 days | Status: CP
Start: 2018-07-05 — End: 2019-08-01
  Filled 2018-07-08: qty 30, 30d supply, fill #0

## 2018-07-05 NOTE — Unmapped (Signed)
UPDATE   Aspirin and pantoprazole not sent-need rx's on file  Will request via wam    Everlean Cherry CPHT

## 2018-07-05 NOTE — Unmapped (Signed)
TRANSPLANT SURGERY PROGRESS NOTE    Assessment and Plan  Michelle Travis is a 62 y.o. 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   She is clinically doing very well. Recent mild transaminase elevation, added on high risk labs for repeat today.  ??  Tacrolimus goal slightly above goal range (5-7), will wait for adjustments given decreasing prednisone from 5mg  daily to 2.5mg  daily per Dr. Sarita Bottom approval. Continue Envarsus 7mg  daily. 360mg  MMF. Wean prednisone to 2.5mg  daily.    Baseline pre-liver disease weight 170s per patient, now 193. She has been worked up from a thyroid perspective, given elevated fasting BG, will start liraglutide today 0.6mg . Consider increase in dosing to 1.2mg  daily after 1-2 weeks if tolerating well. Will prescribe glucometer for nightly BG checks before dinner.    Continue to monitor for worsening/persistent diarrhea.    Follow up: 1st annual liver transplant follow up  Lab frequency: weekly    Subjective  Michelle Travis is a 62 y.o. 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 She received grafts from an increased social risk donor. She did well postoperatively; however, her hospital course was complicated by pneumomediastinum, completely resolved by discharge.    She denies N/V, abdominal pain or dysuria. She does report intermittent diarrhea at night that wakes her from sleep. She reports that when she wakes up be she begins snacking heavily with crab chips in the evenings which continues throughout her daytime hours. She has gained 20-30lbs since transplant. She otherwise reports some improvement to HA now on envarsus but still experiences every once in a while.    Objective    Vitals:    07/08/18 0946   BP: 119/77   Pulse: 69   Temp: 36 ??C (96.8 ??F) TempSrc: Tympanic   Weight: 87.9 kg (193 lb 11.2 oz)   Height: 160 cm (5' 3)      Body mass index is 34.31 kg/m??.     Physical Exam:    General Appearance:   No acute distress  Lungs:                Clear to auscultation bilaterally  Heart:                           Regular rate and rhythm  Abdomen:                Soft, non-tender, non-distended incisions healing well  Extremities:              Warm and well perfused, no edema      Data Review:  All lab results last 24 hours:    Recent Results (from the past 24 hour(s))   Tacrolimus Level, Trough    Collection Time: 07/08/18  9:22 AM   Result Value Ref Range    Tacrolimus, Trough 8.1 5.0 - 15.0 ng/mL   CBC w/ Differential    Collection Time: 07/08/18  9:22 AM   Result Value Ref Range    WBC 8.1 4.5 - 11.0 10*9/L    RBC 4.45 4.00 - 5.20 10*12/L    HGB 11.2 (L) 12.0 - 16.0 g/dL    HCT 16.1 09.6 - 04.5 %    MCV 82.7 80.0 - 100.0 fL    MCH 25.1 (L) 26.0 -  34.0 pg    MCHC 30.3 (L) 31.0 - 37.0 g/dL    RDW 16.1 (H) 09.6 - 15.0 %    MPV 8.1 7.0 - 10.0 fL    Platelet 424 150 - 440 10*9/L    Neutrophils % 68.4 %    Lymphocytes % 20.5 %    Monocytes % 5.8 %    Eosinophils % 2.3 %    Basophils % 0.7 %    Absolute Neutrophils 5.6 2.0 - 7.5 10*9/L    Absolute Lymphocytes 1.7 1.5 - 5.0 10*9/L    Absolute Monocytes 0.5 0.2 - 0.8 10*9/L    Absolute Eosinophils 0.2 0.0 - 0.4 10*9/L    Absolute Basophils 0.1 0.0 - 0.1 10*9/L    Large Unstained Cells 2 0 - 4 %    Microcytosis Slight (A) Not Present    Anisocytosis Slight (A) Not Present    Hypochromasia Marked (A) Not Present   Morphology Review    Collection Time: 07/08/18  9:22 AM   Result Value Ref Range    Smear Review Comments     Gamma GT    Collection Time: 07/08/18  9:23 AM   Result Value Ref Range    GGT 77 (H) 11 - 48 U/L   Magnesium Level    Collection Time: 07/08/18  9:23 AM   Result Value Ref Range    Magnesium 1.3 (L) 1.6 - 2.2 mg/dL   Phosphorus Level    Collection Time: 07/08/18  9:23 AM   Result Value Ref Range Phosphorus 2.8 (L) 2.9 - 4.7 mg/dL   Bilirubin, Direct    Collection Time: 07/08/18  9:23 AM   Result Value Ref Range    Bilirubin, Direct <0.10 0.00 - 0.40 mg/dL   Comprehensive Metabolic Panel    Collection Time: 07/08/18  9:23 AM   Result Value Ref Range    Sodium 141 135 - 145 mmol/L    Potassium 4.9 3.5 - 5.0 mmol/L    Chloride 108 (H) 98 - 107 mmol/L    CO2 24.0 22.0 - 30.0 mmol/L    BUN 14 7 - 21 mg/dL    Creatinine 0.45 4.09 - 1.00 mg/dL    BUN/Creatinine Ratio 22     EGFR CKD-EPI Non-African American, Female >90 >=60 mL/min/1.10m2    EGFR CKD-EPI African American, Female >90 >=60 mL/min/1.40m2    Glucose 100 (H) 65 - 99 mg/dL    Calcium 9.4 8.5 - 81.1 mg/dL    Albumin 4.1 3.5 - 5.0 g/dL    Total Protein 6.8 6.5 - 8.3 g/dL    Total Bilirubin 0.3 0.0 - 1.2 mg/dL    AST 20 14 - 38 U/L    ALT 24 15 - 48 U/L    Alkaline Phosphatase 77 38 - 126 U/L    Anion Gap 9 9 - 15 mmol/L   Protein/Creatinine Ratio, Urine    Collection Time: 07/08/18 11:09 AM   Result Value Ref Range    Creat U 77.2 Undefined mg/dL    Protein, Ur 8.1 Undefined mg/dL    Protein/Creatinine Ratio, Urine 0.105 Undefined         _________________________________________    Gertie Fey, DNP, APRN, FNP-C  [facility]  [address]  [place], [place]  [address]

## 2018-07-06 NOTE — Unmapped (Signed)
Patient called to relay she realized this morning that she forgot to take her medicine yesterday morning 07/05/18 AM (this included once daily envarsus, prednisone and myfortic for immunosuppression).  She confirms she took her myfortic last night but failed to realize she had missed other morning AM meds until she took her AM meds this morning.    Reviewed she should not double up on meds this morning only continue with regularly scheduled dosing.  Discussed potential to use alarms on phone etc as reminder to take meds - stressed importance of compliance and routine timing of medications.  She noted this is the first time she has forgotten meds and she will be more diligent moving forward.      Patient will have labs drawn during clinic visit on 07/08/18.

## 2018-07-08 ENCOUNTER — Ambulatory Visit: Admit: 2018-07-08 | Discharge: 2018-07-08 | Payer: MEDICARE

## 2018-07-08 ENCOUNTER — Ambulatory Visit: Admit: 2018-07-08 | Discharge: 2018-07-09 | Payer: MEDICARE | Attending: Registered" | Primary: Registered"

## 2018-07-08 DIAGNOSIS — D472 Monoclonal gammopathy: Secondary | ICD-10-CM | POA: Diagnosis not present

## 2018-07-08 DIAGNOSIS — Z940 Kidney transplant status: Secondary | ICD-10-CM | POA: Diagnosis not present

## 2018-07-08 DIAGNOSIS — Z4822 Encounter for aftercare following kidney transplant: Secondary | ICD-10-CM | POA: Diagnosis not present

## 2018-07-08 DIAGNOSIS — R51 Headache: Secondary | ICD-10-CM | POA: Diagnosis not present

## 2018-07-08 DIAGNOSIS — E669 Obesity, unspecified: Secondary | ICD-10-CM | POA: Diagnosis not present

## 2018-07-08 DIAGNOSIS — Z7952 Long term (current) use of systemic steroids: Secondary | ICD-10-CM | POA: Diagnosis not present

## 2018-07-08 DIAGNOSIS — R197 Diarrhea, unspecified: Secondary | ICD-10-CM | POA: Diagnosis not present

## 2018-07-08 DIAGNOSIS — Z944 Liver transplant status: Secondary | ICD-10-CM | POA: Diagnosis not present

## 2018-07-08 DIAGNOSIS — Z79899 Other long term (current) drug therapy: Secondary | ICD-10-CM | POA: Diagnosis not present

## 2018-07-08 DIAGNOSIS — Z6834 Body mass index (BMI) 34.0-34.9, adult: Secondary | ICD-10-CM | POA: Diagnosis not present

## 2018-07-08 DIAGNOSIS — R740 Nonspecific elevation of levels of transaminase and lactic acid dehydrogenase [LDH]: Secondary | ICD-10-CM | POA: Diagnosis not present

## 2018-07-08 DIAGNOSIS — Z4823 Encounter for aftercare following liver transplant: Secondary | ICD-10-CM | POA: Diagnosis not present

## 2018-07-08 DIAGNOSIS — R7301 Impaired fasting glucose: Secondary | ICD-10-CM | POA: Diagnosis not present

## 2018-07-08 DIAGNOSIS — K769 Liver disease, unspecified: Secondary | ICD-10-CM

## 2018-07-08 DIAGNOSIS — B259 Cytomegaloviral disease, unspecified: Secondary | ICD-10-CM

## 2018-07-08 DIAGNOSIS — Z9189 Other specified personal risk factors, not elsewhere classified: Principal | ICD-10-CM

## 2018-07-08 LAB — CBC W/ AUTO DIFF
BASOPHILS ABSOLUTE COUNT: 0.1 10*9/L (ref 0.0–0.1)
BASOPHILS RELATIVE PERCENT: 0.7 %
EOSINOPHILS ABSOLUTE COUNT: 0.2 10*9/L (ref 0.0–0.4)
EOSINOPHILS RELATIVE PERCENT: 2.3 %
HEMATOCRIT: 36.8 % (ref 36.0–46.0)
HEMOGLOBIN: 11.2 g/dL — ABNORMAL LOW (ref 12.0–16.0)
LARGE UNSTAINED CELLS: 2 % (ref 0–4)
LYMPHOCYTES RELATIVE PERCENT: 20.5 %
MEAN CORPUSCULAR HEMOGLOBIN: 25.1 pg — ABNORMAL LOW (ref 26.0–34.0)
MEAN CORPUSCULAR VOLUME: 82.7 fL (ref 80.0–100.0)
MEAN PLATELET VOLUME: 8.1 fL (ref 7.0–10.0)
MONOCYTES ABSOLUTE COUNT: 0.5 10*9/L (ref 0.2–0.8)
MONOCYTES RELATIVE PERCENT: 5.8 %
NEUTROPHILS ABSOLUTE COUNT: 5.6 10*9/L (ref 2.0–7.5)
NEUTROPHILS RELATIVE PERCENT: 68.4 %
PLATELET COUNT: 424 10*9/L (ref 150–440)
RED BLOOD CELL COUNT: 4.45 10*12/L (ref 4.00–5.20)
RED CELL DISTRIBUTION WIDTH: 16.1 % — ABNORMAL HIGH (ref 12.0–15.0)

## 2018-07-08 LAB — PHOSPHORUS: Phosphate:MCnc:Pt:Ser/Plas:Qn:: 2.8 — ABNORMAL LOW

## 2018-07-08 LAB — COMPREHENSIVE METABOLIC PANEL
ALBUMIN: 4.1 g/dL (ref 3.5–5.0)
ALKALINE PHOSPHATASE: 77 U/L (ref 38–126)
ALT (SGPT): 24 U/L (ref 15–48)
ANION GAP: 9 mmol/L (ref 9–15)
AST (SGOT): 20 U/L (ref 14–38)
BILIRUBIN TOTAL: 0.3 mg/dL (ref 0.0–1.2)
BLOOD UREA NITROGEN: 14 mg/dL (ref 7–21)
BUN / CREAT RATIO: 22
CALCIUM: 9.4 mg/dL (ref 8.5–10.2)
CHLORIDE: 108 mmol/L — ABNORMAL HIGH (ref 98–107)
CO2: 24 mmol/L (ref 22.0–30.0)
EGFR CKD-EPI AA FEMALE: >90 mL/min/{1.73_m2} (ref >=60)
EGFR CKD-EPI NON-AA FEMALE: >90 mL/min/{1.73_m2} (ref >=60)
GLUCOSE RANDOM: 100 mg/dL — ABNORMAL HIGH (ref 65–99)
POTASSIUM: 4.9 mmol/L (ref 3.5–5.0)
PROTEIN TOTAL: 6.8 g/dL (ref 6.5–8.3)
SODIUM: 141 mmol/L (ref 135–145)

## 2018-07-08 LAB — SMEAR REVIEW: Lab: 0

## 2018-07-08 LAB — RED BLOOD CELL COUNT: Lab: 4.45

## 2018-07-08 LAB — TACROLIMUS, TROUGH: Lab: 8.1

## 2018-07-08 LAB — GAMMA GLUTAMYL TRANSFERASE: Gamma glutamyl transferase:CCnc:Pt:Ser/Plas:Qn:: 77 — ABNORMAL HIGH

## 2018-07-08 LAB — BILIRUBIN DIRECT: Bilirubin.glucuronidated:MCnc:Pt:Ser/Plas:Qn:: <0.1

## 2018-07-08 LAB — PROTEIN/CREAT RATIO, URINE: Protein/Creatinine:MRto:Pt:Urine:Qn:: 0.105

## 2018-07-08 LAB — MAGNESIUM: Magnesium:MCnc:Pt:Ser/Plas:Qn:: 1.3 — ABNORMAL LOW

## 2018-07-08 LAB — ALBUMIN: Albumin:MCnc:Pt:Ser/Plas:Qn:: 4.1

## 2018-07-08 MED ORDER — LIRAGLUTIDE 0.6 MG/0.1 ML (18 MG/3 ML) SUBCUTANEOUS PEN INJECTOR
Freq: Every day | SUBCUTANEOUS | 0 refills | 0.00000 days | Status: CP
Start: 2018-07-08 — End: 2018-07-25
  Filled 2018-07-15: qty 6, 60d supply, fill #0

## 2018-07-08 MED ORDER — PREDNISONE 2.5 MG TABLET
ORAL_TABLET | Freq: Every day | ORAL | 2 refills | 0 days | Status: CP
Start: 2018-07-08 — End: 2018-09-09

## 2018-07-08 MED FILL — PANTOPRAZOLE 40 MG TABLET,DELAYED RELEASE: 30 days supply | Qty: 30 | Fill #0 | Status: AC

## 2018-07-08 NOTE — Unmapped (Addendum)
Left VM for daughter about decrease prednisone to 2.5 mg daily per Dr. Veronda Prude.  Requested call back to confirm message received.     Received call from daughter and she agreed to have patient decrease prednisone to 2.5 mg daily and requested script be sent to local [facility].  Also let her know labs show improvement but still waiting for CMV and HCV testing to result which will occur latter this week.

## 2018-07-08 NOTE — Unmapped (Signed)
Encounter opened in error. Pt not seen in transplant clinic - arrived 35 minutes late for her 30 minute appt.     Jackqulyn Livings MPH, RD, LDN  Pager: [phone]

## 2018-07-08 NOTE — Unmapped (Signed)
Patient seen with NP Martin-Velez.  Patient in clinic with her daughter and other family member. Patient denies changes in use of alcohol, tobacco and controlled substances.       Patient currentlyon disability.     Patient educated for 15 minutes about lab results and upcoming appointments and care plan.      Per NP patient to have benefit investigation for Saxenda to assist with weight gain.  If patient medication is approved by insurance will also have her get a blood sugar monitor.  Plan will be for patient to administer Saxenda in am and check blood sugars in the pm.    Patient taking Brand Envarsus, Brand Myfortic, and prednisone 5 mg daily last taken at 0900 on 07/07/18.  Patient accidentally missed 07/05/18 Envarsus dose.  Per NP spoke with PharmD Mincemoyer and Kidney Coordinator [place] about prednisone 5 mg daily.  Dr. Veronda Prude transplant nephrologist messaged if this dose can be decreased to 2.5 mg daily given patients issues with weight gain.     Patient to return to clinic 09/09/18 for one year annual visit with Nutrition, Transplant Surgery, Transplant Pharmacy, Lab and Social Work.   See Providers note for further detail.

## 2018-07-08 NOTE — Unmapped (Addendum)
N95 Respirator to be worn when doing any construction or yard work.      Start taking liraglutide daily - this will hopefully help you lose some weight!   Stop snacking late at night, dont overeat when you feel full stop - this medicine makes you feel this way.    Check your blood sugar at least daily for the first few weeks to one month    Medication Guide  Victoza?? (VIC-tow-za)  (liraglutide [rDNA origin] injection)  solution, for subcutaneous use   Read this Medication Guide before you start using Victoza and each time you get a refill. There may be  new information. This information does not take the place of talking to your healthcare provider about  your medical condition or your treatment.   What is the most important information I should know about Victoza?  Victoza may cause serious side effects, including:  ? Possible thyroid tumors, including cancer. Tell your healthcare provider if you get a lump or swelling in your neck, hoarseness, trouble swallowing, or shortness of breath. These may be symptoms of thyroid cancer. In studies with rats and mice, Victoza and medicines that work like Victoza caused thyroid tumors, including thyroid cancer. It is not known if Victoza will cause thyroid tumors or a type of thyroid cancer called medullary thyroid carcinoma (MTC) in people.   ? Do not use Victoza if you or any of your family have ever had a type of thyroid cancer called medullary  thyroid carcinoma (MTC), or if you have an endocrine system condition called Multiple Endocrine Neoplasia syndrome type 2 (MEN 2).   What is Victoza?  Victoza is an injectable prescription medicine that may improve blood sugar (glucose) in adults with type 2 diabetes mellitus, and should be used along with diet and exercise.   ? Victoza is not recommended as the first choice of medicine for treating diabetes.   ? It is not known if Victoza can be used in people who have had pancreatitis.   ? Victoza is not a substitute for insulin and is not for use in people with type 1 diabetes or people with diabetic ketoacidosis.   ? It is not known if Victoza can be used with mealtime insulin.   ? It is not known if Victoza is safe and effective for use in children.   Who should not use Victoza?  Do not use Victoza if:  ? you or any of your family have ever had a type of thyroid cancer called medullary thyroid carcinoma (MTC) or if you have an endocrine system condition called Multiple Endocrine Neoplasia syndrome type 2 (MEN 2).   ? you are allergic to liraglutide or any of the ingredients in Victoza. See the end of this Medication Guide for a complete list of ingredients in Victoza.   What should I tell my healthcare provider before using Victoza?  Before using Victoza, tell your healthcare provider if you:   ? have or have had problems with your pancreas, kidneys, or liver   ? have severe problems with your stomach, such as slowed emptying of your stomach (gastroparesis) or problems with digesting food   ? have any other medical conditions are pregnant or plan to become pregnant. It is not known if Victoza will harm your unborn baby. Tell your healthcare provider if you become pregnant while using Victoza.  ? are breastfeeding or plan to breastfeed. It is not known if Victoza passes into your breast milk. You should not  use Victoza while breastfeeding without first talking with your healthcare provider.    Tell your healthcare provider about all the medicines you take, including prescription and over-the counter  medicines, vitamins, and herbal supplements. Victoza may affect the way some medicines work and some medicines may affect the way Victoza works.   Before using Victoza, talk to your healthcare provider about low blood sugar and how to manage it. Tell your healthcare provider if you are taking other medicines to treat diabetes, including insulin or sulfonylureas.  Know the medicines you take. Keep a list of them to show your healthcare provider and pharmacist when you get a new medicine.   How should I use Victoza?   ? Read the Instructions for Use that comes with Victoza.   ? Use Victoza exactly as your healthcare provider tells you to.  Your healthcare provider should show you how to use Victoza before you use it for the first time.   ? Victoza is injected under the skin (subcutaneously) of your stomach (abdomen), thigh, or upper arm.  Do not inject Victoza into a muscle (intramuscularly) or vein (intravenously).  ? Use Victoza 1 time each day, at any time of the day.   ? If you miss a dose of Victoza, take the missed dose at the next scheduled dose. Do not take 2 doses of Victoza at the same time.   ? Victoza may be taken with or without food.   ? Do not mix insulin and Victoza together in the same injection.   ? You may give an injection of Victoza and insulin in the same body area (such as your stomach area), but not right next to each other.   ? Change (rotate) your injection site with each injection. Do not use the same site for each injection.   ? Do not share your Victoza pen with other people, even if the needle has been changed. You may give other people a serious infection, or get a serious infection from them.  Your dose of Victoza and other diabetes medicines may need to change because of:  ???  change in level of physical activity or exercise, weight gain or loss, increased stress, illness, change in diet, or because of other medicines you take.   What are the possible side effects of Victoza?  Victoza may cause serious side effects, including:  ??? See ???What is the most important information I should know about Victoza????  ? inflammation of your pancreas (pancreatitis). Stop using Victoza and call your healthcare provider right away if you have severe pain in your stomach area (abdomen) that will not go away, with or without vomiting. You may feel the pain from your abdomen to your back.  ???  low blood sugar (hypoglycemia). Your risk for getting low blood sugar may be higher if you use Victoza with another medicine that can cause low blood sugar, such as a sulfonylurea or insulin.  Signs and symptoms of low blood sugar may include:  ??? dizziness or light-headedness  ??? blurred vision  ??? anxiety, irritability, or mood changes  ??? sweating  ??? slurred speech  ??? hunger  ??? confusion or drowsiness  ??? shakiness  ??? weakness  ??? headache  ??? fast heartbeat  ??? feeling jittery  ???  kidney problems (kidney failure). In people who have kidney problems, diarrhea, nausea, and vomiting may cause a loss of fluids (dehydration) which may cause kidney problems to get worse.  ? serious allergic reactions. Stop using  Victoza and get medical help right away, if you have any symptoms of a serious allergic reaction including itching, rash, or difficulty breathing.  The most common side effects of Victoza may include headache, nausea, diarrhea, vomiting, antiliraglutide  antibodies in your blood.  Talk to your healthcare provider about any side effect that bothers you or does not go away. These are not all the possible side effects of Victoza.  Call your doctor for medical advice about side effects. You may report side effects to FDA at 1-800-FDA-1088.   General information about the safe and effective use of Victoza.  Medicines are sometimes prescribed for purposes other than those listed in a Medication Guide. Do not use Victoza for a condition for which it was not prescribed. Do not give Victoza to other people, even if they have the same symptoms that you have. It may harm them.    This Medication Guide summarizes the most important information about Victoza. If you would like more  information, talk with your healthcare provider. You can ask your pharmacist or healthcare provider for  information about Victoza that is written for health professionals.    For more information, go to victoza.com or call [phone].   What are the ingredients in Victoza?  Active Ingredient: liraglutide  Inactive Ingredients: disodium phosphate dihydrate, propylene glycol, phenol and water for injection   This Medication Guide has been approved by the U.S. Food and Drug Administration.  Manufactured JW:[employer], [id number], [place]  Victoza?? is a Producer, television/film/video. Victoza?? is covered by [place] Patent Nos. [id number],  [id number], [id number], [id number] and other patents pending. Victoza?? pen is covered by [place] Patent Nos.  [id number], RE [id number], RE [phone] and other patents pending.  ?? 2010-2015 Novo Nordisk  Revised: March 2015

## 2018-07-09 LAB — HEPATITIS C RNA, QUANTITATIVE, PCR: HCV RNA: NOT DETECTED

## 2018-07-09 LAB — HCV RNA: Hepatitis C virus RNA:PrThr:Pt:Ser/Plas:Ord:Probe.amp.tar: NOT DETECTED

## 2018-07-10 LAB — CMV QUANT: Lab: 0

## 2018-07-10 LAB — CMV DNA, QUANTITATIVE, PCR

## 2018-07-12 NOTE — Unmapped (Signed)
Patient asked about her Liraglutide medication.  Instructed her to call [facility] to find out and confirmed her next appointment is 09/09/18.  Sent message to [facility] to see the order status of Liraglutide.

## 2018-07-15 MED FILL — VICTOZA 2-PAK 0.6 MG/0.1 ML (18 MG/3 ML) SUBCUTANEOUS PEN INJECTOR: 60 days supply | Qty: 6 | Fill #0 | Status: AC

## 2018-07-16 MED ORDER — BLOOD-GLUCOSE METER
0 refills | 0 days | Status: CP
Start: 2018-07-16 — End: ?

## 2018-07-16 MED ORDER — PEN NEEDLE, DIABETIC 32 GAUGE X 1/4" (6 MM)
Freq: Every day | 3 refills | 0 days | Status: CP
Start: 2018-07-16 — End: 2019-07-16

## 2018-07-16 MED ORDER — BLOOD SUGAR DIAGNOSTIC STRIPS
Freq: Every day | 3 refills | 0.00000 days | Status: CP
Start: 2018-07-16 — End: ?

## 2018-07-16 MED ORDER — LANCETS
0 refills | 0 days | Status: CP
Start: 2018-07-16 — End: ?

## 2018-07-16 NOTE — Unmapped (Signed)
Patient agreed to repeat labs this week to reassess LFTs after decrease in prednisone last week.

## 2018-07-16 NOTE — Unmapped (Signed)
Patient called to report she received her liraglutide pens from [facility]. Our team was not aware this was approved and planned to be sent so she does not have additional supplies.     Ordered pen needles, lancets, test strips and BG meter to be sent to local pharmacy so she can begin using the medication.     Asked her to check her weight prior to starting medication and keep a log at least weekly. Will check BG in the afternoons and PRN when starting liraglutide.        Gertie Fey, DNP, APRN, FNP-C  [facility] for [facility]  [address]  [place], [place]  [address]

## 2018-07-17 ENCOUNTER — Other Ambulatory Visit (HOSPITAL_COMMUNITY): Payer: Self-pay | Admitting: Interventional Radiology

## 2018-07-17 DIAGNOSIS — K769 Liver disease, unspecified: Secondary | ICD-10-CM | POA: Diagnosis not present

## 2018-07-17 DIAGNOSIS — Z944 Liver transplant status: Secondary | ICD-10-CM | POA: Diagnosis not present

## 2018-07-17 DIAGNOSIS — Z95828 Presence of other vascular implants and grafts: Secondary | ICD-10-CM

## 2018-07-17 DIAGNOSIS — Z79899 Other long term (current) drug therapy: Secondary | ICD-10-CM | POA: Diagnosis not present

## 2018-07-17 NOTE — Unmapped (Signed)
Patient called confirming she started the liraglutide (VICTOZA) injection today.

## 2018-07-18 ENCOUNTER — Telehealth (INDEPENDENT_AMBULATORY_CARE_PROVIDER_SITE_OTHER): Payer: Self-pay | Admitting: *Deleted

## 2018-07-18 LAB — COMPREHENSIVE METABOLIC PANEL
A/G RATIO: 2 (ref 1.2–2.2)
ALBUMIN: 4.3 g/dL (ref 3.6–4.8)
ALKALINE PHOSPHATASE: 76 IU/L (ref 39–117)
ALT (SGPT): 18 IU/L (ref 0–32)
AST (SGOT): 16 IU/L (ref 0–40)
BILIRUBIN TOTAL: 0.3 mg/dL (ref 0.0–1.2)
BLOOD UREA NITROGEN: 16 mg/dL (ref 8–27)
BUN / CREAT RATIO: 21 (ref 12–28)
CHLORIDE: 105 mmol/L (ref 96–106)
CO2: 23 mmol/L (ref 20–29)
CREATININE: 0.75 mg/dL (ref 0.57–1.00)
GLOBULIN, TOTAL: 2.1 g/dL (ref 1.5–4.5)
GLUCOSE: 105 mg/dL — ABNORMAL HIGH (ref 65–99)
SODIUM: 143 mmol/L (ref 134–144)
TOTAL PROTEIN: 6.4 g/dL (ref 6.0–8.5)

## 2018-07-18 LAB — CBC W/ DIFFERENTIAL
BANDED NEUTROPHILS ABSOLUTE COUNT: 0 10*3/uL (ref 0.0–0.1)
BASOPHILS ABSOLUTE COUNT: 0 10*3/uL (ref 0.0–0.2)
BASOPHILS RELATIVE PERCENT: 1 %
EOSINOPHILS ABSOLUTE COUNT: 0.2 10*3/uL (ref 0.0–0.4)
EOSINOPHILS RELATIVE PERCENT: 4 %
HEMATOCRIT: 35.5 % (ref 34.0–46.6)
IMMATURE GRANULOCYTES: 0 %
LYMPHOCYTES ABSOLUTE COUNT: 1.4 10*3/uL (ref 0.7–3.1)
LYMPHOCYTES RELATIVE PERCENT: 26 %
MEAN CORPUSCULAR HEMOGLOBIN CONC: 31.5 g/dL (ref 31.5–35.7)
MEAN CORPUSCULAR VOLUME: 79 fL (ref 79–97)
MONOCYTES ABSOLUTE COUNT: 0.6 10*3/uL (ref 0.1–0.9)
MONOCYTES RELATIVE PERCENT: 11 %
NEUTROPHILS ABSOLUTE COUNT: 3.3 10*3/uL (ref 1.4–7.0)
NEUTROPHILS RELATIVE PERCENT: 58 %
PLATELET COUNT: 341 10*3/uL (ref 150–450)
RED BLOOD CELL COUNT: 4.47 x10E6/uL (ref 3.77–5.28)
RED CELL DISTRIBUTION WIDTH: 16.7 % — ABNORMAL HIGH (ref 12.3–15.4)
WHITE BLOOD CELL COUNT: 5.6 10*3/uL (ref 3.4–10.8)

## 2018-07-18 LAB — MEAN CORPUSCULAR HEMOGLOBIN CONC: Lab: 31.5

## 2018-07-18 LAB — BILIRUBIN DIRECT: Lab: 0.1

## 2018-07-18 LAB — GAMMA GLUTAMYL TRANSFERASE: Lab: 57

## 2018-07-18 LAB — AST (SGOT): Lab: 16

## 2018-07-18 LAB — MAGNESIUM: Lab: 1.3 — ABNORMAL LOW

## 2018-07-18 LAB — PHOSPHORUS, SERUM: Lab: 2.8

## 2018-07-18 NOTE — Unmapped (Signed)
Spoke with patient and educated to check blood sugars nightly for the next week and to use Saxenda daily.

## 2018-07-18 NOTE — Telephone Encounter (Signed)
agree

## 2018-07-18 NOTE — Telephone Encounter (Signed)
Referring MD/PCP: fanta   Procedure: tcs  Reason/Indication:  Hx polyps  Has patient had this procedure before?  Yes, 03/2013  If so, when, by whom and where?    Is there a family history of colon cancer?  no  Who?  What age when diagnosed?    Is patient diabetic?   no      Does patient have prosthetic heart valve or mechanical valve?  no  Do you have a pacemaker?  no  Has patient ever had endocarditis? no  Has patient had joint replacement within last 12 months?  no  Is patient constipated or do they take laxatives? no  Does patient have a history of alcohol/drug use?  no  Is patient on blood thinner such as Coumadin, Plavix and/or Aspirin? yes  Medications: envarsus 1 mg bid, envarsus 4 mg bid, myfortic 180 mg daily, prednisone 5 mg daily, asa 81 mg daily, vit D3 daily  Allergies: see epic  Medication Adjustment per Dr Lindi Adie, NP: asa 2 days  Procedure date & time: 08/07/18 at 930

## 2018-07-19 DIAGNOSIS — R188 Other ascites: Secondary | ICD-10-CM | POA: Diagnosis not present

## 2018-07-19 DIAGNOSIS — Z0000 Encounter for general adult medical examination without abnormal findings: Secondary | ICD-10-CM | POA: Diagnosis not present

## 2018-07-19 DIAGNOSIS — Z944 Liver transplant status: Secondary | ICD-10-CM | POA: Diagnosis not present

## 2018-07-19 DIAGNOSIS — I10 Essential (primary) hypertension: Secondary | ICD-10-CM | POA: Diagnosis not present

## 2018-07-19 DIAGNOSIS — Z940 Kidney transplant status: Secondary | ICD-10-CM | POA: Diagnosis not present

## 2018-07-19 DIAGNOSIS — Z0001 Encounter for general adult medical examination with abnormal findings: Secondary | ICD-10-CM | POA: Diagnosis not present

## 2018-07-19 DIAGNOSIS — Z1389 Encounter for screening for other disorder: Secondary | ICD-10-CM | POA: Diagnosis not present

## 2018-07-19 DIAGNOSIS — K769 Liver disease, unspecified: Secondary | ICD-10-CM | POA: Diagnosis not present

## 2018-07-19 DIAGNOSIS — F1020 Alcohol dependence, uncomplicated: Secondary | ICD-10-CM | POA: Diagnosis not present

## 2018-07-19 DIAGNOSIS — Z96651 Presence of right artificial knee joint: Secondary | ICD-10-CM | POA: Diagnosis not present

## 2018-07-19 DIAGNOSIS — F339 Major depressive disorder, recurrent, unspecified: Secondary | ICD-10-CM | POA: Diagnosis not present

## 2018-07-19 DIAGNOSIS — J90 Pleural effusion, not elsewhere classified: Secondary | ICD-10-CM | POA: Diagnosis not present

## 2018-07-19 DIAGNOSIS — Z23 Encounter for immunization: Secondary | ICD-10-CM | POA: Diagnosis not present

## 2018-07-19 DIAGNOSIS — D684 Acquired coagulation factor deficiency: Secondary | ICD-10-CM | POA: Diagnosis not present

## 2018-07-19 DIAGNOSIS — K5732 Diverticulitis of large intestine without perforation or abscess without bleeding: Secondary | ICD-10-CM | POA: Diagnosis not present

## 2018-07-19 DIAGNOSIS — Z1331 Encounter for screening for depression: Secondary | ICD-10-CM | POA: Diagnosis not present

## 2018-07-19 LAB — TACROLIMUS BLOOD: Lab: 7.4

## 2018-07-19 NOTE — Unmapped (Signed)
Per NP Martin-Velez no intervention for Aslightly above goal tac of 7.4 (goal 5-7) and Mag of 1.3.  Will see what repeat labs look like now that he is on Saxenda.

## 2018-07-23 NOTE — Unmapped (Signed)
Patient has been on saxedna for 6 days and blood sugars ranging from 112-135.  Instructed her to get labs drawn today to check to see how she is doing.  She verbalized understanding.

## 2018-07-24 DIAGNOSIS — K769 Liver disease, unspecified: Secondary | ICD-10-CM | POA: Diagnosis not present

## 2018-07-24 DIAGNOSIS — Z79899 Other long term (current) drug therapy: Secondary | ICD-10-CM | POA: Diagnosis not present

## 2018-07-24 DIAGNOSIS — Z944 Liver transplant status: Secondary | ICD-10-CM | POA: Diagnosis not present

## 2018-07-25 LAB — COMPREHENSIVE METABOLIC PANEL
A/G RATIO: 2.3 — ABNORMAL HIGH (ref 1.2–2.2)
ALBUMIN: 4.5 g/dL (ref 3.6–4.8)
ALKALINE PHOSPHATASE: 74 IU/L (ref 39–117)
ALT (SGPT): 20 IU/L (ref 0–32)
AST (SGOT): 18 IU/L (ref 0–40)
BILIRUBIN TOTAL: 0.2 mg/dL (ref 0.0–1.2)
BLOOD UREA NITROGEN: 16 mg/dL (ref 8–27)
BUN / CREAT RATIO: 19 (ref 12–28)
CHLORIDE: 102 mmol/L (ref 96–106)
CREATININE: 0.83 mg/dL (ref 0.57–1.00)
GLOBULIN, TOTAL: 2 g/dL (ref 1.5–4.5)
GLUCOSE: 94 mg/dL (ref 65–99)
SODIUM: 142 mmol/L (ref 134–144)
TOTAL PROTEIN: 6.5 g/dL (ref 6.0–8.5)

## 2018-07-25 LAB — CBC W/ DIFFERENTIAL
BANDED NEUTROPHILS ABSOLUTE COUNT: 0 10*3/uL (ref 0.0–0.1)
BASOPHILS ABSOLUTE COUNT: 0 10*3/uL (ref 0.0–0.2)
BASOPHILS RELATIVE PERCENT: 1 %
EOSINOPHILS ABSOLUTE COUNT: 0.1 10*3/uL (ref 0.0–0.4)
EOSINOPHILS RELATIVE PERCENT: 2 %
HEMATOCRIT: 37.7 % (ref 34.0–46.6)
HEMOGLOBIN: 11.5 g/dL (ref 11.1–15.9)
IMMATURE GRANULOCYTES: 0 %
LYMPHOCYTES ABSOLUTE COUNT: 1.4 10*3/uL (ref 0.7–3.1)
MEAN CORPUSCULAR HEMOGLOBIN CONC: 30.5 g/dL — ABNORMAL LOW (ref 31.5–35.7)
MEAN CORPUSCULAR HEMOGLOBIN: 24.8 pg — ABNORMAL LOW (ref 26.6–33.0)
MEAN CORPUSCULAR VOLUME: 81 fL (ref 79–97)
MONOCYTES ABSOLUTE COUNT: 0.6 10*3/uL (ref 0.1–0.9)
MONOCYTES RELATIVE PERCENT: 9 %
NEUTROPHILS ABSOLUTE COUNT: 4.1 10*3/uL (ref 1.4–7.0)
NEUTROPHILS RELATIVE PERCENT: 66 %
PLATELET COUNT: 333 10*3/uL (ref 150–450)
RED BLOOD CELL COUNT: 4.63 x10E6/uL (ref 3.77–5.28)
RED CELL DISTRIBUTION WIDTH: 16.3 % — ABNORMAL HIGH (ref 12.3–15.4)
WHITE BLOOD CELL COUNT: 6.4 10*3/uL (ref 3.4–10.8)

## 2018-07-25 LAB — GAMMA GLUTAMYL TRANSFERASE: Lab: 57

## 2018-07-25 LAB — RED BLOOD CELL COUNT: Lab: 4.63

## 2018-07-25 LAB — MAGNESIUM: Lab: 1.3 — ABNORMAL LOW

## 2018-07-25 LAB — A/G RATIO: Lab: 2.3 — ABNORMAL HIGH

## 2018-07-25 LAB — PHOSPHORUS, SERUM: Lab: 2.8

## 2018-07-25 LAB — BILIRUBIN DIRECT: Lab: 0.1

## 2018-07-25 MED ORDER — LIRAGLUTIDE 0.6 MG/0.1 ML (18 MG/3 ML) SUBCUTANEOUS PEN INJECTOR
Freq: Every day | SUBCUTANEOUS | 0 refills | 0 days | Status: CP
Start: 2018-07-25 — End: 2018-08-15

## 2018-07-25 NOTE — Unmapped (Addendum)
Per NP Martin-Velez patient to increase her dose of victoza to 1.2 mg daily, ensure she is drinking 870-100 ounces of water/day, and to continue to check blood sugars daily for the next week to ensure they are normal. Called and spoke with daughter and she verbalized understanding. But then reported patient has not had an appetite and not really eating for the past two days.  Tried calling patient again to get more details but busy tone on the phone.  Instructed daughter to NOT adjust medication at this time until I can speak with patient and provider.  Patient confirmed she drinks pleny of water and tastes food but has not eaten anything.  Confirmed with PharmD Mincemoyer patient needs to try ot eat at least 1000 calories a day and see if she is able to do so.  Patient verbalized understanding.  Will call her tomorrow to check in with her.

## 2018-07-26 LAB — TACROLIMUS BLOOD: Lab: 12.3

## 2018-07-26 MED ORDER — ENVARSUS XR 4 MG TABLET,EXTENDED RELEASE
ORAL_TABLET | Freq: Every day | ORAL | 11 refills | 0 days
Start: 2018-07-26 — End: 2018-08-06

## 2018-07-26 NOTE — Unmapped (Signed)
Per NP Martin-Velez patient to decrease Envarsus to 6 mg daily.  Patient agreed to decrease dose tomorrow since she had already taken her Envarsus for the day at 9 am this morning.  Confirmed she is eating again and last night had 3 pieces of Fish, 15 french fries, 1cup slaw, and 1 cup of bake beans.  Then a handful of potatoe chips prior to bedtime.  Provided education to patient to continue to eat but choice health options like fruit, vegetables, lean meats, and whole grains.  Educated to cut out fried food.  Patient agreed.

## 2018-07-29 NOTE — Unmapped (Addendum)
Patient called confirming she has been eating well over the weekend.    Saturday   Fish, Chicken, slaw bake beans, mac and sala, potatoes sala  Sunday  Pork chops and bake beans  Asked if she needs to continue to check her sugars since they are always above 100.  Let he rknow I need to talk with a provider and will let her know.   Stated today was her 27th wedding anniversary and was tearful on the phone (patient is a widow).  Reassured her to go do what she needs to do and if I need to make any changes can leave a VM on her machine and talk with her latter.     Per NP Martin-Velez patient to increase dose of Victoza to 1.2 mg (one click) and continue to record blood sugars for the next week.  Left VM for patient with instructions and to call back to confirm.

## 2018-07-30 NOTE — Unmapped (Signed)
confirmed with patient to get labs drawn tomorrow.  Also confirmed she increased her dose of victoza.

## 2018-07-31 DIAGNOSIS — Z944 Liver transplant status: Secondary | ICD-10-CM | POA: Diagnosis not present

## 2018-07-31 DIAGNOSIS — K769 Liver disease, unspecified: Secondary | ICD-10-CM | POA: Diagnosis not present

## 2018-07-31 DIAGNOSIS — Z79899 Other long term (current) drug therapy: Secondary | ICD-10-CM | POA: Diagnosis not present

## 2018-08-01 LAB — CBC W/ DIFFERENTIAL
BASOPHILS ABSOLUTE COUNT: 0.1 10*3/uL (ref 0.0–0.2)
EOSINOPHILS ABSOLUTE COUNT: 0.2 10*3/uL (ref 0.0–0.4)
EOSINOPHILS RELATIVE PERCENT: 3 %
HEMATOCRIT: 37.6 % (ref 34.0–46.6)
HEMOGLOBIN: 11.7 g/dL (ref 11.1–15.9)
IMMATURE GRANULOCYTES: 1 %
LYMPHOCYTES ABSOLUTE COUNT: 1.5 10*3/uL (ref 0.7–3.1)
LYMPHOCYTES RELATIVE PERCENT: 25 %
MEAN CORPUSCULAR HEMOGLOBIN CONC: 31.1 g/dL — ABNORMAL LOW (ref 31.5–35.7)
MEAN CORPUSCULAR HEMOGLOBIN: 25.2 pg — ABNORMAL LOW (ref 26.6–33.0)
MEAN CORPUSCULAR VOLUME: 81 fL (ref 79–97)
MONOCYTES ABSOLUTE COUNT: 0.6 10*3/uL (ref 0.1–0.9)
NEUTROPHILS ABSOLUTE COUNT: 3.7 10*3/uL (ref 1.4–7.0)
NEUTROPHILS RELATIVE PERCENT: 60 %
PLATELET COUNT: 350 10*3/uL (ref 150–450)
RED BLOOD CELL COUNT: 4.64 x10E6/uL (ref 3.77–5.28)
RED CELL DISTRIBUTION WIDTH: 15.8 % — ABNORMAL HIGH (ref 12.3–15.4)
WHITE BLOOD CELL COUNT: 6.1 10*3/uL (ref 3.4–10.8)

## 2018-08-01 LAB — COMPREHENSIVE METABOLIC PANEL
A/G RATIO: 1.8 (ref 1.2–2.2)
ALBUMIN: 4.2 g/dL (ref 3.6–4.8)
ALKALINE PHOSPHATASE: 83 IU/L (ref 39–117)
ALT (SGPT): 13 IU/L (ref 0–32)
BILIRUBIN TOTAL: <0.2 mg/dL (ref 0.0–1.2)
BLOOD UREA NITROGEN: 21 mg/dL (ref 8–27)
BUN / CREAT RATIO: 24 (ref 12–28)
CALCIUM: 9.1 mg/dL (ref 8.7–10.3)
CHLORIDE: 107 mmol/L — ABNORMAL HIGH (ref 96–106)
CO2: 20 mmol/L (ref 20–29)
CREATININE: 0.88 mg/dL (ref 0.57–1.00)
GLOBULIN, TOTAL: 2.3 g/dL (ref 1.5–4.5)
GLUCOSE: 96 mg/dL (ref 65–99)
POTASSIUM: 4.4 mmol/L (ref 3.5–5.2)
SODIUM: 143 mmol/L (ref 134–144)

## 2018-08-01 LAB — GLUCOSE: Lab: 96

## 2018-08-01 LAB — MAGNESIUM
Lab: 1.4 — ABNORMAL LOW
MAGNESIUM: 1.4 mg/dL — ABNORMAL LOW (ref 1.6–2.3)

## 2018-08-01 LAB — BILIRUBIN DIRECT: Lab: 0.09

## 2018-08-01 LAB — PHOSPHORUS, SERUM: Lab: 2.6

## 2018-08-01 LAB — RED BLOOD CELL COUNT: Lab: 4.64

## 2018-08-01 LAB — GAMMA GLUTAMYL TRANSFERASE: Lab: 44

## 2018-08-01 MED FILL — PANTOPRAZOLE 40 MG TABLET,DELAYED RELEASE: 30 days supply | Qty: 30 | Fill #1 | Status: AC

## 2018-08-01 MED FILL — ATORVASTATIN 10 MG TABLET: 30 days supply | Qty: 30 | Fill #0 | Status: AC

## 2018-08-01 MED FILL — PANTOPRAZOLE 40 MG TABLET,DELAYED RELEASE: ORAL | 30 days supply | Qty: 30 | Fill #1

## 2018-08-01 MED FILL — ATORVASTATIN 10 MG TABLET: ORAL | 30 days supply | Qty: 30 | Fill #0

## 2018-08-02 LAB — TACROLIMUS BLOOD: Lab: 10.7

## 2018-08-02 NOTE — Unmapped (Signed)
[facility] Refill Coordination Note    Specialty Medication(s) to be Shipped:   Transplant: Myfortic 180mg     Other medication(s) to be shipped: KYNZLEY DOWSON, DOB: [birth year]  Phone: [phone] (home) [phone] (work)  Shipping Address: [address]  [place] [address]    All above HIPAA information was verified with patient.     Completed refill call assessment today to schedule patient's medication shipment from the [facility] [phone]).       Specialty medication(s) and dose(s) confirmed: Regimen is correct and unchanged.   Changes to medications: Risa reports no changes reported at this time.  Changes to insurance: No  Questions for the pharmacist: No    The patient will receive a drug information handout for each medication shipped and additional FDA Medication Guides as required.      DISEASE/MEDICATION-SPECIFIC INFORMATION        N/A    ADHERENCE     Medication Adherence    Patient reported X missed doses in the last month:  0              MEDICARE PART B DOCUMENTATION     Myfortic 180mg : Patient has 10 DAYS WORTH OF tablets on hand.    SHIPPING     Shipping address confirmed in Epic.     Delivery Scheduled: Yes, Expected medication delivery date: 08/07/18 via UPS or courier.     [place] A Noe Goyer   [facility] Specialty Technician

## 2018-08-06 MED ORDER — ENVARSUS XR 4 MG TABLET,EXTENDED RELEASE
ORAL_TABLET | Freq: Every day | ORAL | 11 refills | 0.00000 days
Start: 2018-08-06 — End: 2018-08-26

## 2018-08-06 MED ORDER — ENVARSUS XR 1 MG TABLET,EXTENDED RELEASE
ORAL_TABLET | 3 refills | 0 days
Start: 2018-08-06 — End: 2018-08-26

## 2018-08-06 MED FILL — MYFORTIC 180 MG TABLET,DELAYED RELEASE: 30 days supply | Qty: 120 | Fill #1

## 2018-08-06 MED FILL — MYFORTIC 180 MG TABLET,DELAYED RELEASE: 30 days supply | Qty: 120 | Fill #1 | Status: AC

## 2018-08-06 NOTE — Unmapped (Addendum)
Called patient about change in Envarsus dose to 5 mg starting 10/08   Patient verbalized understanding.   Pt. Reports she has a scheduled colonoscopy Wednesday 10/9 and was told to get labs after the change and the procedure on Monday.

## 2018-08-07 ENCOUNTER — Encounter (HOSPITAL_COMMUNITY): Payer: Self-pay | Admitting: *Deleted

## 2018-08-07 ENCOUNTER — Ambulatory Visit (HOSPITAL_COMMUNITY)
Admission: RE | Admit: 2018-08-07 | Discharge: 2018-08-07 | Disposition: A | Discharge status: Home / Self Care | Payer: Medicare HMO | Source: Ambulatory Visit | Attending: Internal Medicine | Admitting: Internal Medicine

## 2018-08-07 ENCOUNTER — Encounter (HOSPITAL_COMMUNITY)
Admission: RE | Disposition: A | Discharge status: Home / Self Care | Payer: Self-pay | Source: Ambulatory Visit | Attending: Internal Medicine

## 2018-08-07 ENCOUNTER — Other Ambulatory Visit: Payer: Self-pay

## 2018-08-07 DIAGNOSIS — D123 Benign neoplasm of transverse colon: Secondary | ICD-10-CM | POA: Diagnosis not present

## 2018-08-07 DIAGNOSIS — K635 Polyp of colon: Secondary | ICD-10-CM | POA: Diagnosis not present

## 2018-08-07 DIAGNOSIS — Z860100 Personal history of colon polyps, unspecified: Secondary | ICD-10-CM | POA: Insufficient documentation

## 2018-08-07 DIAGNOSIS — G8929 Other chronic pain: Secondary | ICD-10-CM | POA: Insufficient documentation

## 2018-08-07 DIAGNOSIS — Z96651 Presence of right artificial knee joint: Secondary | ICD-10-CM | POA: Diagnosis not present

## 2018-08-07 DIAGNOSIS — Z940 Kidney transplant status: Secondary | ICD-10-CM | POA: Insufficient documentation

## 2018-08-07 DIAGNOSIS — Z1211 Encounter for screening for malignant neoplasm of colon: Secondary | ICD-10-CM | POA: Insufficient documentation

## 2018-08-07 DIAGNOSIS — I120 Hypertensive chronic kidney disease with stage 5 chronic kidney disease or end stage renal disease: Secondary | ICD-10-CM | POA: Diagnosis not present

## 2018-08-07 DIAGNOSIS — Z7952 Long term (current) use of systemic steroids: Secondary | ICD-10-CM | POA: Insufficient documentation

## 2018-08-07 DIAGNOSIS — Z853 Personal history of malignant neoplasm of breast: Secondary | ICD-10-CM | POA: Insufficient documentation

## 2018-08-07 DIAGNOSIS — Z7982 Long term (current) use of aspirin: Secondary | ICD-10-CM | POA: Insufficient documentation

## 2018-08-07 DIAGNOSIS — K644 Residual hemorrhoidal skin tags: Secondary | ICD-10-CM | POA: Diagnosis not present

## 2018-08-07 DIAGNOSIS — G4730 Sleep apnea, unspecified: Secondary | ICD-10-CM | POA: Diagnosis not present

## 2018-08-07 DIAGNOSIS — Z09 Encounter for follow-up examination after completed treatment for conditions other than malignant neoplasm: Secondary | ICD-10-CM | POA: Diagnosis not present

## 2018-08-07 DIAGNOSIS — K5730 Diverticulosis of large intestine without perforation or abscess without bleeding: Secondary | ICD-10-CM | POA: Insufficient documentation

## 2018-08-07 DIAGNOSIS — Z79899 Other long term (current) drug therapy: Secondary | ICD-10-CM | POA: Diagnosis not present

## 2018-08-07 DIAGNOSIS — K5140 Inflammatory polyps of colon without complications: Secondary | ICD-10-CM | POA: Diagnosis not present

## 2018-08-07 DIAGNOSIS — Z944 Liver transplant status: Secondary | ICD-10-CM | POA: Diagnosis not present

## 2018-08-07 HISTORY — PX: COLONOSCOPY: SHX5424

## 2018-08-07 HISTORY — PX: POLYPECTOMY: SHX5525

## 2018-08-07 SURGERY — COLONOSCOPY
Anesthesia: Moderate Sedation

## 2018-08-07 MED ORDER — STERILE WATER FOR IRRIGATION IR SOLN
Status: DC | PRN
  Administered 2018-08-07: 10:00:00

## 2018-08-07 MED ORDER — MEPERIDINE HCL 50 MG/ML IJ SOLN
INTRAMUSCULAR | Status: AC
  Filled 2018-08-07: qty 1

## 2018-08-07 MED ORDER — SODIUM CHLORIDE 0.9 % IV SOLN
INTRAVENOUS | Status: DC
  Administered 2018-08-07: 1000 mL via INTRAVENOUS

## 2018-08-07 MED ORDER — MEPERIDINE HCL 50 MG/ML IJ SOLN
INTRAMUSCULAR | Status: DC | PRN
  Administered 2018-08-07: 15 mg via INTRAVENOUS
  Administered 2018-08-07: 25 mg via INTRAVENOUS

## 2018-08-07 MED ORDER — MIDAZOLAM HCL 5 MG/5ML IJ SOLN
INTRAMUSCULAR | Status: DC | PRN
  Administered 2018-08-07 (×2): 2 mg via INTRAVENOUS
  Administered 2018-08-07: 1 mg via INTRAVENOUS

## 2018-08-07 MED ORDER — MIDAZOLAM HCL 5 MG/5ML IJ SOLN
INTRAMUSCULAR | Status: AC
  Filled 2018-08-07: qty 10

## 2018-08-07 NOTE — H&P (Signed)
Savannah Jackson is an 62 y.o. female.   Chief Complaint: Patient is here for colonoscopy. HPI: Patient is 62 year old female with history of colonic adenomas and is here for surveillance colonoscopy.  Last exam was in June 2014.  She denies abdominal pain change in bowel habits or rectal bleeding. 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 Last aspirin dose was 3 days ago. Family history is negative for CRC.  Past Medical History:  Diagnosis Date      . Brain aneurysm   . Breast disorder    right breast cancer 2002  . Cancer [facility])    breast cancer- lumpectromy, , chemotherapy, radiation  . Chronic back pain Diverticultis  .  08/30/2016  . 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 DDD (degenerative disc disease), lumbar   . Diverticulosis    scope 2014  . ETOH abuse     04/23/2017  . Hepatomegaly    scope 2014  . History of breast cancer 06/07/2016  . Hypertension   . 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  . 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 Pneumonia   . Rotator cuff syndrome of left shoulder   . Sleep apnea    does not wear CPAP  . Thickened endometrium 06/19/2016   Will get endo biopsy     Past Surgical History:  Procedure Laterality Date  . AV FISTULA PLACEMENT Left 05/22/2017   Procedure: BRACHIAL ARTERY TO BASCILIC VEIN  FISTULA CREATION;  Surgeon: Elam Dutch, MD;  Location: [place];  Service: Vascular;  Laterality: Left;  . BASCILIC VEIN TRANSPOSITION Left 08/07/2017   Procedure: BASILIC VEIN TRANSPOSITION SECOND STAGE;  Surgeon: Elam Dutch, MD;  Location: [place];  Service: Vascular;  Laterality: Left;  . BRAIN SURGERY     aneurysm at [place] Right    cancer- lunmmnpectomy-   . CATARACT EXTRACTION Bilateral    [facility] 2 or 3 years ago  . CHOLECYSTECTOMY    . COLONOSCOPY N/A 04/10/2013   Procedure: COLONOSCOPY;  Surgeon: Rogene Houston, MD;  [facility];  Service: Endoscopy;  Laterality: N/A;  1030-rescheduled to 1200 Ann notified pt  . ESOPHAGOGASTRODUODENOSCOPY N/A 04/22/2015   Procedure: ESOPHAGOGASTRODUODENOSCOPY (EGD);  Surgeon: Rogene Houston, MD;  [facility];  Service: Endoscopy;  Laterality: N/A;  . EYE SURGERY    . IR GENERIC HISTORICAL  07/04/2016   IR RADIOLOGIST EVAL & MGMT 07/04/2016 Jacqulynn Cadet, MD [facility] INTERV RAD  . IR RADIOLOGIST EVAL & MGMT  07/24/2017  . KIDNEY SURGERY    . KNEE ARTHROSCOPY Left   . LIVER TRANSPLANT    . RADIOLOGY WITH ANESTHESIA N/A 07/16/2015   Procedure: TIPS;  Surgeon: Medication Radiologist, MD;  Location: [place];  Service: Radiology;  Laterality: N/A;  . ROTATOR CUFF REPAIR Left   . TOTAL KNEE ARTHROPLASTY Right    right. 2002    Family History  Problem Relation Age of Onset  . Aneurysm Mother   . Other Daughter        knee replacement  . Hypertension Daughter    Social History:  reports that she has never smoked. She has never used smokeless tobacco. She reports that she does not drink alcohol or use drugs.  Allergies:  Allergies  Allergen Reactions  . Latex Swelling and Other (See Comments)    Discoloration of skin.   Marland Kitchen  Vicodin [Hydrocodone-Acetaminophen] Itching    Medications Prior to Admission  Medication Sig Dispense Refill  . aspirin EC 81 MG tablet Take 81 mg by mouth daily.    . cholecalciferol (VITAMIN D) 1000 units tablet Take 1,000 Units by mouth daily.    Marland Kitchen ENVARSUS XR 1 MG TB24 Take 2 mg by mouth daily.     Marland Kitchen ENVARSUS XR 4 MG TB24 Take 4 mg by mouth daily.    . mycophenolate (MYFORTIC) 180 MG EC tablet Take 360 mg by mouth 2 (two) times daily.    . pantoprazole (PROTONIX) 40 MG tablet Take 40 mg by mouth daily.    . predniSONE (DELTASONE) 2.5 MG tablet Take 2.5 mg by mouth daily.  2    No results found for this or any previous visit (from the past 48 hour(s)). No results  found.  ROS  Blood pressure 119/81, pulse 72, temperature 97.7 F (36.5 C), temperature source Oral, resp. rate 19, height 5\' 3"  (1.6 m), weight 84.4 kg, SpO2 96 %. Physical Exam  Constitutional: She appears well-developed and well-nourished.  HENT:  Mouth/Throat: Oropharynx is clear and moist.  Eyes: Conjunctivae are normal. No scleral icterus.  Neck: No thyromegaly present.  Cardiovascular: Normal rate, regular rhythm and normal heart sounds.  No murmur heard. Respiratory: Effort normal and breath sounds normal.  GI:  Abdomen is full with long scar across upper abdomen.  She has another scar in left lower quadrant.  Abdomen is soft and nontender with organomegaly or masses.  Transplanted kidney could not be palpated.  Musculoskeletal: She exhibits no edema.  Lymphadenopathy:    She has no cervical adenopathy.  Neurological: She is alert.  Skin: Skin is warm and dry.     Assessment/Plan History of colonic adenomas. Surveillance colonoscopy  Hildred Laser, MD 08/07/2018, 9:31 AM

## 2018-08-07 NOTE — Op Note (Signed)
[facility] Patient Name: Savannah Jackson Procedure Date: 08/07/2018 9:17 AM MRN: [id number] Date of Birth: [birth year] Attending MD: Hildred Laser , MD CSN: [id number] Age: 62 Admit Type: Outpatient Procedure:                Colonoscopy Indications:              High risk colon cancer surveillance: Personal                            history of colonic polyps Providers:                Hildred Laser, MD, Otis Peak B. Sharon Seller, RN, [name] Rothman, Technician Referring MD:             Rosita Fire, MD Medicines:                Meperidine 40 mg IV, Midazolam 5 mg IV Complications:            No immediate complications. Estimated Blood Loss:     Estimated blood loss was minimal. Procedure:                Pre-Anesthesia Assessment:                           - Prior to the procedure, a History and Physical                            was performed, and patient medications and                            allergies were reviewed. The patient's tolerance of                            previous anesthesia was also reviewed. The risks                            and benefits of the procedure and the sedation                            options and risks were discussed with the patient.                            All questions were answered, and informed consent                            was obtained. Prior Anticoagulants: The patient                            last took aspirin 3 days prior to the procedure.                            ASA Grade Assessment: III - A patient with severe  systemic disease. After reviewing the risks and                            benefits, the patient was deemed in satisfactory                            condition to undergo the procedure.                           After obtaining informed consent, the colonoscope                            was passed under direct vision. Throughout the   procedure, the patient's blood pressure, pulse, and                            oxygen saturations were monitored continuously. The                            PCF-H190DL ([id number]) scope was introduced through                            the anus and advanced to the the cecum, identified                            by appendiceal orifice and ileocecal valve. The                            colonoscopy was performed without difficulty. The                            patient tolerated the procedure well. The quality                            of the bowel preparation was excellent. The                            ileocecal valve, appendiceal orifice, and rectum                            were photographed. Scope In: 9:42:09 AM Scope Out: 9:58:34 AM Scope Withdrawal Time: 0 hours 11 minutes 12 seconds  Total Procedure Duration: 0 hours 16 minutes 25 seconds  Findings:      The perianal and digital rectal examinations were normal.      A 6 mm polyp was found in the proximal transverse colon. The polyp was       removed with a cold snare. Resection and retrieval were complete.      Multiple small and large-mouthed diverticula were found in the entire       colon.      External hemorrhoids were found during retroflexion. The hemorrhoids       were small. Impression:               - One 6 mm polyp in the proximal transverse colon,  removed with a cold snare. Resected and retrieved.                           - Diverticulosis in the entire examined colon.                           - External hemorrhoids. Moderate Sedation:      Moderate (conscious) sedation was administered by the endoscopy nurse       and supervised by the endoscopist. The following parameters were       monitored: oxygen saturation, heart rate, blood pressure, CO2       capnography and response to care. Total physician intraservice time was       21 minutes. Recommendation:           - Patient has a  contact number available for                            emergencies. The signs and symptoms of potential                            delayed complications were discussed with the                            patient. Return to normal activities tomorrow.                            Written discharge instructions were provided to the                            patient.                           - High fiber diet today.                           - Continue present medications.                           - Resume aspirin at prior dose in 2 days.                           - Await pathology results.                           - Repeat colonoscopy in 5 years for surveillance. Procedure Code(s):        --- Professional ---                           [phone], Colonoscopy, flexible; with removal of                            tumor(s), polyp(s), or other lesion(s) by snare                            technique  G0500, Moderate sedation services provided by the                            same physician or other qualified health care                            professional performing a gastrointestinal                            endoscopic service that sedation supports,                            requiring the presence of an independent trained                            observer to assist in the monitoring of the                            patient's level of consciousness and physiological                            status; initial 15 minutes of intra-service time;                            patient age 78 years or older (additional time may                            be reported with [phone], as appropriate) Diagnosis Code(s):        --- Professional ---                           Z86.010, Personal history of colonic polyps                           D12.3, Benign neoplasm of transverse colon (hepatic                            flexure or splenic flexure)                            K64.4, Residual hemorrhoidal skin tags                           K57.30, Diverticulosis of large intestine without                            perforation or abscess without bleeding CPT copyright 2018 [employer]. All rights reserved. The codes documented in this report are preliminary and upon coder review may  be revised to meet current compliance requirements. Hildred Laser, MD Hildred Laser, MD 08/07/2018 10:06:56 AM This report has been signed electronically. Number of Addenda: 0

## 2018-08-07 NOTE — Discharge Instructions (Signed)
Resume aspirin on 08/09/2018. Resume other medications as before. High-fiber diet. No driving for 24 hours. Physician will call with biopsy results. Next colonoscopy in 5 years.  PATIENT INSTRUCTIONS POST-ANESTHESIA  IMMEDIATELY FOLLOWING SURGERY:  Do not drive or operate machinery for the first twenty four hours after surgery.  Do not make any important decisions for twenty four hours after surgery or while taking narcotic pain medications or sedatives.  If you develop intractable nausea and vomiting or a severe headache please notify your doctor immediately.  FOLLOW-UP:  Please make an appointment with your surgeon as instructed. You do not need to follow up with anesthesia unless specifically instructed to do so.  WOUND CARE INSTRUCTIONS (if applicable):  Keep a dry clean dressing on the anesthesia/puncture wound site if there is drainage.  Once the wound has quit draining you may leave it open to air.  Generally you should leave the bandage intact for twenty four hours unless there is drainage.  If the epidural site drains for more than 36-48 hours please call the anesthesia department.  QUESTIONS?:  Please feel free to call your physician or the hospital operator if you have any questions, and they will be happy to assist you.      Colonoscopy, Adult, Care After This sheet gives you information about how to care for yourself after your procedure. Your doctor may also give you more specific instructions. If you have problems or questions, call your doctor. Follow these instructions at home: General instructions   For the first 24 hours after the procedure: ? Do not drive or use machinery. ? Do not sign important documents. ? Do not drink alcohol. ? Do your daily activities more slowly than normal. ? Eat foods that are soft and easy to digest. ? Rest often.  Take over-the-counter or prescription medicines only as told by your doctor.  It is up to you to get the results of your  procedure. Ask your doctor, or the department performing the procedure, when your results will be ready. To help cramping and bloating:  Try walking around.  Put heat on your belly (abdomen) as told by your doctor. Use a heat source that your doctor recommends, such as a moist heat pack or a heating pad. ? Put a towel between your skin and the heat source. ? Leave the heat on for 20-30 minutes. ? Remove the heat if your skin turns bright red. This is especially important if you cannot feel pain, heat, or cold. You can get burned. Eating and drinking  Drink enough fluid to keep your pee (urine) clear or pale yellow.  Return to your normal diet as told by your doctor. Avoid heavy or fried foods that are hard to digest.  Avoid drinking alcohol for as long as told by your doctor. Contact a doctor if:  You have blood in your poop (stool) 2-3 days after the procedure. Get help right away if:  You have more than a small amount of blood in your poop.  You see large clumps of tissue (blood clots) in your poop.  Your belly is swollen.  You feel sick to your stomach (nauseous).  You throw up (vomit).  You have a fever.  You have belly pain that gets worse, and medicine does not help your pain. This information is not intended to replace advice given to you by your health care provider. Make sure you discuss any questions you have with your health care provider. Document Released: 11/18/2010 Document Revised:  07/10/2016 Document Reviewed: 07/10/2016 Elsevier Interactive Patient Education  2017 [place].     Colon Polyps Polyps are tissue growths inside the body. Polyps can grow in many places, including the large intestine (colon). A polyp may be a round bump or a mushroom-shaped growth. You could have one polyp or several. Most colon polyps are noncancerous (benign). However, some colon polyps can become cancerous over time. What are the causes? The exact cause of colon polyps  is not known. What increases the risk? This condition is more likely to develop in people who:  Have a family history of colon cancer or colon polyps.  Are older than 7 or older than 45 if they are African American.  Have inflammatory bowel disease, such as ulcerative colitis or Crohn disease.  Are overweight.  Smoke cigarettes.  Do not get enough exercise.  Drink too much alcohol.  Eat a diet that is: ? High in fat and red meat. ? Low in fiber.  Had childhood cancer that was treated with abdominal radiation.  What are the signs or symptoms? Most polyps do not cause symptoms. If you have symptoms, they may include:  Blood coming from your rectum when having a bowel movement.  Blood in your stool.The stool may look dark red or black.  A change in bowel habits, such as constipation or diarrhea.  How is this diagnosed? This condition is diagnosed with a colonoscopy. This is a procedure that uses a lighted, flexible scope to look at the inside of your colon. How is this treated? Treatment for this condition involves removing any polyps that are found. Those polyps will then be tested for cancer. If cancer is found, your health care provider will talk to you about options for colon cancer treatment. Follow these instructions at home: Diet  Eat plenty of fiber, such as fruits, vegetables, and whole grains.  Eat foods that are high in calcium and vitamin D, such as milk, cheese, yogurt, eggs, liver, fish, and broccoli.  Limit foods high in fat, red meats, and processed meats, such as hot dogs, sausage, bacon, and lunch meats.  Maintain a healthy weight, or lose weight if recommended by your health care provider. General instructions  Do not smoke cigarettes.  Do not drink alcohol excessively.  Keep all follow-up visits as told by your health care provider. This is important. This includes keeping regularly scheduled colonoscopies. Talk to your health care provider  about when you need a colonoscopy.  Exercise every day or as told by your health care provider. Contact a health care provider if:  You have new or worsening bleeding during a bowel movement.  You have new or increased blood in your stool.  You have a change in bowel habits.  You unexpectedly lose weight. This information is not intended to replace advice given to you by your health care provider. Make sure you discuss any questions you have with your health care provider. Document Released: 07/12/2004 Document Revised: 03/23/2016 Document Reviewed: 09/06/2015 Elsevier Interactive Patient Education  [employer].   Diverticulosis Diverticulosis is a condition that develops when small pouches (diverticula) form in the wall of the large intestine (colon). The colon is where water is absorbed and stool is formed. The pouches form when the inside layer of the colon pushes through weak spots in the outer layers of the colon. You may have a few pouches or many of them. What are the causes? The cause of this condition is not known. What increases  the risk? The following factors may make you more likely to develop this condition:  Being older than age 38. Your risk for this condition increases with age. Diverticulosis is rare among people younger than age 69. By age 36, many people have it.  Eating a low-fiber diet.  Having frequent constipation.  Being overweight.  Not getting enough exercise.  Smoking.  Taking over-the-counter pain medicines, like aspirin and ibuprofen.  Having a family history of diverticulosis.  What are the signs or symptoms? In most people, there are no symptoms of this condition. If you do have symptoms, they may include:  Bloating.  Cramps in the abdomen.  Constipation or diarrhea.  Pain in the lower left side of the abdomen.  How is this diagnosed? This condition is most often diagnosed during an exam for other colon problems. Because  diverticulosis usually has no symptoms, it often cannot be diagnosed independently. This condition may be diagnosed by:  Using a flexible scope to examine the colon (colonoscopy).  Taking an X-ray of the colon after dye has been put into the colon (barium enema).  Doing a CT scan.  How is this treated? You may not need treatment for this condition if you have never developed an infection related to diverticulosis. If you have had an infection before, treatment may include:  Eating a high-fiber diet. This may include eating more fruits, vegetables, and grains.  Taking a fiber supplement.  Taking a live bacteria supplement (probiotic).  Taking medicine to relax your colon.  Taking antibiotic medicines.  Follow these instructions at home:  Drink 6-8 glasses of water or more each day to prevent constipation.  Try not to strain when you have a bowel movement.  If you have had an infection before: ? Eat more fiber as directed by your health care provider or your diet and nutrition specialist (dietitian). ? Take a fiber supplement or probiotic, if your health care provider approves.  Take over-the-counter and prescription medicines only as told by your health care provider.  If you were prescribed an antibiotic, take it as told by your health care provider. Do not stop taking the antibiotic even if you start to feel better.  Keep all follow-up visits as told by your health care provider. This is important. Contact a health care provider if:  You have pain in your abdomen.  You have bloating.  You have cramps.  You have not had a bowel movement in 3 days. Get help right away if:  Your pain gets worse.  Your bloating becomes very bad.  You have a fever or chills, and your symptoms suddenly get worse.  You vomit.  You have bowel movements that are bloody or black.  You have bleeding from your rectum. Summary  Diverticulosis is a condition that develops when small  pouches (diverticula) form in the wall of the large intestine (colon).  You may have a few pouches or many of them.  This condition is most often diagnosed during an exam for other colon problems.  If you have had an infection related to diverticulosis, treatment may include increasing the fiber in your diet, taking supplements, or taking medicines. This information is not intended to replace advice given to you by your health care provider. Make sure you discuss any questions you have with your health care provider. Document Released: 07/13/2004 Document Revised: 09/04/2016 Document Reviewed: 09/04/2016 Elsevier Interactive Patient Education  2017 [employer].   Hemorrhoids Hemorrhoids are swollen veins in and around the  rectum or anus. Hemorrhoids can cause pain, itching, or bleeding. Most of the time, they do not cause serious problems. They usually get better with diet changes, lifestyle changes, and other home treatments. Follow these instructions at home: Eating and drinking  Eat foods that have fiber, such as whole grains, beans, nuts, fruits, and vegetables. Ask your doctor about taking products that have added fiber (fibersupplements).  Drink enough fluid to keep your pee (urine) clear or pale yellow. For Pain and Swelling  Take a warm-water bath (sitz bath) for 20 minutes to ease pain. Do this 3-4 times a day.  If directed, put ice on the painful area. It may be helpful to use ice between your warm baths. ? Put ice in a plastic bag. ? Place a towel between your skin and the bag. ? Leave the ice on for 20 minutes, 2-3 times a day. General instructions  Take over-the-counter and prescription medicines only as told by your doctor. ? Medicated creams and medicines that are inserted into the anus (suppositories) may be used or applied as told.  Exercise often.  Go to the bathroom when you have the urge to poop (to have a bowel movement). Do not wait.  Avoid pushing too  hard (straining) when you poop.  Keep the butt area dry and clean. Use wet toilet paper or moist paper towels.  Do not sit on the toilet for a long time. Contact a doctor if:  You have any of these: ? Pain and swelling that do not get better with treatment or medicine. ? Bleeding that will not stop. ? Trouble pooping or you cannot poop. ? Pain or swelling outside the area of the hemorrhoids. This information is not intended to replace advice given to you by your health care provider. Make sure you discuss any questions you have with your health care provider. Document Released: 07/25/2008 Document Revised: 03/23/2016 Document Reviewed: 06/30/2015 Elsevier Interactive Patient Education  2018 [place].    High-Fiber Diet Fiber, also called dietary fiber, is a type of carbohydrate found in fruits, vegetables, whole grains, and beans. A high-fiber diet can have many health benefits. Your health care provider may recommend a high-fiber diet to help:  Prevent constipation. Fiber can make your bowel movements more regular.  Lower your cholesterol.  Relieve hemorrhoids, uncomplicated diverticulosis, or irritable bowel syndrome.  Prevent overeating as part of a weight-loss plan.  Prevent heart disease, type 2 diabetes, and certain cancers.  What is my plan? The recommended daily intake of fiber includes:  38 grams for men under age 74.  1 grams for men over age 76.  54 grams for women under age 69.  79 grams for women over age 40.  You can get the recommended daily intake of dietary fiber by eating a variety of fruits, vegetables, grains, and beans. Your health care provider may also recommend a fiber supplement if it is not possible to get enough fiber through your diet. What do I need to know about a high-fiber diet?  Fiber supplements have not been widely studied for their effectiveness, so it is better to get fiber through food sources.  Always check the fiber content  on thenutrition facts label of any prepackaged food. Look for foods that contain at least 5 grams of fiber per serving.  Ask your dietitian if you have questions about specific foods that are related to your condition, especially if those foods are not listed in the following section.  Increase your daily  fiber consumption gradually. Increasing your intake of dietary fiber too quickly may cause bloating, cramping, or gas.  Drink plenty of water. Water helps you to digest fiber. What foods can I eat? Grains Whole-grain breads. Multigrain cereal. Oats and oatmeal. Brown rice. Barley. Bulgur wheat. [place]. Bran muffins. Popcorn. Rye wafer crackers. Vegetables Sweet potatoes. Spinach. Kale. Artichokes. Cabbage. Broccoli. Green peas. Carrots. Squash. Fruits Berries. Pears. Apples. Oranges. Avocados. Prunes and raisins. Dried figs. Meats and Other Protein Sources [place], kidney, pinto, and soy beans. Split peas. Lentils. Nuts and seeds. Dairy Fiber-fortified yogurt. Beverages Fiber-fortified soy milk. Fiber-fortified orange juice. Other Fiber bars. The items listed above may not be a complete list of recommended foods or beverages. Contact your dietitian for more options. What foods are not recommended? Grains White bread. Pasta made with refined flour. White rice. Vegetables Fried potatoes. Canned vegetables. Well-cooked vegetables. Fruits Fruit juice. Cooked, strained fruit. Meats and Other Protein Sources Fatty cuts of meat. Fried Sales executive or fried fish. Dairy Milk. Yogurt. Cream cheese. Sour cream. Beverages Soft drinks. Other Cakes and pastries. Butter and oils. The items listed above may not be a complete list of foods and beverages to avoid. Contact your dietitian for more information. What are some tips for including high-fiber foods in my diet?  Eat a wide variety of high-fiber foods.  Make sure that half of all grains consumed each day are whole grains.  Replace breads  and cereals made from refined flour or white flour with whole-grain breads and cereals.  Replace white rice with brown rice, bulgur wheat, or millet.  Start the day with a breakfast that is high in fiber, such as a cereal that contains at least 5 grams of fiber per serving.  Use beans in place of meat in soups, salads, or pasta.  Eat high-fiber snacks, such as berries, raw vegetables, nuts, or popcorn. This information is not intended to replace advice given to you by your health care provider. Make sure you discuss any questions you have with your health care provider. Document Released: 10/16/2005 Document Revised: 03/23/2016 Document Reviewed: 03/31/2014 Elsevier Interactive Patient Education  [employer].

## 2018-08-12 DIAGNOSIS — Z944 Liver transplant status: Secondary | ICD-10-CM | POA: Diagnosis not present

## 2018-08-12 DIAGNOSIS — K769 Liver disease, unspecified: Secondary | ICD-10-CM | POA: Diagnosis not present

## 2018-08-12 DIAGNOSIS — Z79899 Other long term (current) drug therapy: Secondary | ICD-10-CM | POA: Diagnosis not present

## 2018-08-13 LAB — COMPREHENSIVE METABOLIC PANEL
A/G RATIO: 2.3 — ABNORMAL HIGH (ref 1.2–2.2)
ALKALINE PHOSPHATASE: 71 IU/L (ref 39–117)
ALT (SGPT): 15 IU/L (ref 0–32)
AST (SGOT): 13 IU/L (ref 0–40)
BILIRUBIN TOTAL: 0.3 mg/dL (ref 0.0–1.2)
BLOOD UREA NITROGEN: 13 mg/dL (ref 8–27)
CALCIUM: 9 mg/dL (ref 8.7–10.3)
CHLORIDE: 107 mmol/L — ABNORMAL HIGH (ref 96–106)
CO2: 23 mmol/L (ref 20–29)
CREATININE: 0.71 mg/dL (ref 0.57–1.00)
GLUCOSE: 104 mg/dL — ABNORMAL HIGH (ref 65–99)
POTASSIUM: 4.3 mmol/L (ref 3.5–5.2)
SODIUM: 146 mmol/L — ABNORMAL HIGH (ref 134–144)
TOTAL PROTEIN: 6.2 g/dL (ref 6.0–8.5)

## 2018-08-13 LAB — CBC W/ DIFFERENTIAL
BANDED NEUTROPHILS ABSOLUTE COUNT: 0 10*3/uL (ref 0.0–0.1)
BASOPHILS ABSOLUTE COUNT: 0.1 10*3/uL (ref 0.0–0.2)
BASOPHILS RELATIVE PERCENT: 1 %
EOSINOPHILS ABSOLUTE COUNT: 0.2 10*3/uL (ref 0.0–0.4)
EOSINOPHILS RELATIVE PERCENT: 4 %
HEMATOCRIT: 36.2 % (ref 34.0–46.6)
HEMOGLOBIN: 11.1 g/dL (ref 11.1–15.9)
IMMATURE GRANULOCYTES: 0 %
LYMPHOCYTES ABSOLUTE COUNT: 1.5 10*3/uL (ref 0.7–3.1)
MEAN CORPUSCULAR HEMOGLOBIN CONC: 30.7 g/dL — ABNORMAL LOW (ref 31.5–35.7)
MEAN CORPUSCULAR HEMOGLOBIN: 25.2 pg — ABNORMAL LOW (ref 26.6–33.0)
MEAN CORPUSCULAR VOLUME: 82 fL (ref 79–97)
MONOCYTES ABSOLUTE COUNT: 0.6 10*3/uL (ref 0.1–0.9)
MONOCYTES RELATIVE PERCENT: 10 %
NEUTROPHILS ABSOLUTE COUNT: 3.3 10*3/uL (ref 1.4–7.0)
NEUTROPHILS RELATIVE PERCENT: 58 %
PLATELET COUNT: 319 10*3/uL (ref 150–450)
RED BLOOD CELL COUNT: 4.41 x10E6/uL (ref 3.77–5.28)
RED CELL DISTRIBUTION WIDTH: 15.6 % — ABNORMAL HIGH (ref 12.3–15.4)

## 2018-08-13 LAB — LYMPHOCYTES RELATIVE PERCENT: Lab: 27

## 2018-08-13 LAB — CO2: Lab: 23

## 2018-08-13 LAB — MAGNESIUM: Lab: 1.5 — ABNORMAL LOW

## 2018-08-13 LAB — BILIRUBIN DIRECT: Lab: 0.1

## 2018-08-13 LAB — GAMMA GLUTAMYL TRANSFERASE: Lab: 35

## 2018-08-13 LAB — PHOSPHORUS, SERUM: Lab: 2.7

## 2018-08-13 NOTE — Unmapped (Addendum)
Patient called coordinator to report symptoms after taking Victoza injection, a feeling of fluttering in chest, funny and tired, she reports wanting to lay around in the evening. Patient reports something doesn't feel right. She reported this happening Friday/Saturday/Sunday after her injection dose. She reports feeling fine today but hasn't taken dose yet. NP Martin-Velez notified and aware of patient symptoms and increased dose. NP requested set of vital signs. When coordinator was speaking with patient, BP 139/81 and HR 67 today approx 5pm. Patient educated that she can stop medication for 1 week to see if she still has symptoms. Per NP Martin-Velez patient can go back to previous dose of 0.6 daily injection. Patient agreed to that.   Pt denies chest pain, SOB, n/v or flu like symptoms.    Coordinator to follow up this week.     NP Martin-Velez notified of patients tac level of 4.6 with a goal of 5-7 and given her symptoms we were not going to make any changes to Envarsus.

## 2018-08-14 ENCOUNTER — Encounter (HOSPITAL_COMMUNITY): Payer: Self-pay | Admitting: Internal Medicine

## 2018-08-14 LAB — TACROLIMUS BLOOD: Lab: 4.6

## 2018-08-15 MED ORDER — LIRAGLUTIDE 0.6 MG/0.1 ML (18 MG/3 ML) SUBCUTANEOUS PEN INJECTOR
Freq: Every day | SUBCUTANEOUS | 0 refills | 0.00000 days
Start: 2018-08-15 — End: 2018-09-09

## 2018-08-16 NOTE — Unmapped (Signed)
Spoke with patient and she is not having the symptoms she was having with increased dose. HR has been 70  Pt. ecduated to call coordinator if having symptoms.     She is now at a 0.6 dose for her Victoza   NP Martin-Velez notified.     Pt. Verbalized understanding.

## 2018-08-21 DIAGNOSIS — Z79899 Other long term (current) drug therapy: Secondary | ICD-10-CM | POA: Diagnosis not present

## 2018-08-21 DIAGNOSIS — Z944 Liver transplant status: Secondary | ICD-10-CM | POA: Diagnosis not present

## 2018-08-21 DIAGNOSIS — K769 Liver disease, unspecified: Secondary | ICD-10-CM | POA: Diagnosis not present

## 2018-08-22 LAB — CBC W/ DIFFERENTIAL
BANDED NEUTROPHILS ABSOLUTE COUNT: 0 10*3/uL (ref 0.0–0.1)
BASOPHILS ABSOLUTE COUNT: 0 10*3/uL (ref 0.0–0.2)
BASOPHILS RELATIVE PERCENT: 1 %
EOSINOPHILS ABSOLUTE COUNT: 0.2 10*3/uL (ref 0.0–0.4)
HEMATOCRIT: 38.3 % (ref 34.0–46.6)
HEMOGLOBIN: 11.4 g/dL (ref 11.1–15.9)
IMMATURE GRANULOCYTES: 1 %
LYMPHOCYTES ABSOLUTE COUNT: 1.4 10*3/uL (ref 0.7–3.1)
LYMPHOCYTES RELATIVE PERCENT: 23 %
MEAN CORPUSCULAR HEMOGLOBIN CONC: 29.8 g/dL — ABNORMAL LOW (ref 31.5–35.7)
MEAN CORPUSCULAR HEMOGLOBIN: 24.2 pg — ABNORMAL LOW (ref 26.6–33.0)
MONOCYTES ABSOLUTE COUNT: 0.6 10*3/uL (ref 0.1–0.9)
MONOCYTES RELATIVE PERCENT: 10 %
NEUTROPHILS ABSOLUTE COUNT: 3.8 10*3/uL (ref 1.4–7.0)
NEUTROPHILS RELATIVE PERCENT: 62 %
PLATELET COUNT: 347 10*3/uL (ref 150–450)
RED BLOOD CELL COUNT: 4.71 x10E6/uL (ref 3.77–5.28)
RED CELL DISTRIBUTION WIDTH: 15.2 % (ref 12.3–15.4)
WHITE BLOOD CELL COUNT: 6.1 10*3/uL (ref 3.4–10.8)

## 2018-08-22 LAB — COMPREHENSIVE METABOLIC PANEL
A/G RATIO: 1.9 (ref 1.2–2.2)
ALBUMIN: 4.2 g/dL (ref 3.6–4.8)
ALKALINE PHOSPHATASE: 81 IU/L (ref 39–117)
AST (SGOT): 17 IU/L (ref 0–40)
BILIRUBIN TOTAL: <0.2 mg/dL (ref 0.0–1.2)
BLOOD UREA NITROGEN: 21 mg/dL (ref 8–27)
BUN / CREAT RATIO: 26 (ref 12–28)
CALCIUM: 9.2 mg/dL (ref 8.7–10.3)
CHLORIDE: 106 mmol/L (ref 96–106)
CO2: 21 mmol/L (ref 20–29)
CREATININE: 0.82 mg/dL (ref 0.57–1.00)
GLOBULIN, TOTAL: 2.2 g/dL (ref 1.5–4.5)
GLUCOSE: 90 mg/dL (ref 65–99)
POTASSIUM: 4.2 mmol/L (ref 3.5–5.2)
SODIUM: 142 mmol/L (ref 134–144)

## 2018-08-22 LAB — BILIRUBIN DIRECT: Lab: 0.09

## 2018-08-22 LAB — PHOSPHORUS, SERUM: Lab: 3.1

## 2018-08-22 LAB — GAMMA GLUTAMYL TRANSFERASE: Lab: 40

## 2018-08-22 LAB — LYMPHOCYTES ABSOLUTE COUNT: Lab: 1.4

## 2018-08-22 LAB — MAGNESIUM: Lab: 1.7

## 2018-08-22 LAB — CO2: Lab: 21

## 2018-08-23 LAB — TACROLIMUS BLOOD: Lab: 9.3

## 2018-08-26 MED ORDER — ENVARSUS XR 4 MG TABLET,EXTENDED RELEASE
ORAL_TABLET | Freq: Every day | ORAL | 3 refills | 0 days
Start: 2018-08-26 — End: 2018-08-28

## 2018-08-26 NOTE — Unmapped (Signed)
Called patient to confirm she takes her medication at 9 am and gets her labs drawn at 9 am.   She denies any consumption of grapefruits, oranges or foods that tend to interact with the medications.     NP Martin-Velez aware of sirolimus level.   Per verbal order, change Envarsus to 4mg  daily.   Requested patient to get labs in 1 week.   Patient verbalizes understanding.     Rx sent.

## 2018-08-26 NOTE — Unmapped (Signed)
Patient called in for Lipitor, and reports opened Myfortic on 10/20, so pushing call out appropriately, based on what patient should have on hand.

## 2018-08-27 ENCOUNTER — Other Ambulatory Visit: Payer: Medicare HMO

## 2018-08-28 MED ORDER — ENVARSUS XR 4 MG TABLET,EXTENDED RELEASE
ORAL_TABLET | Freq: Every day | ORAL | 3 refills | 0.00000 days | Status: CP
Start: 2018-08-28 — End: 2018-11-12

## 2018-08-28 NOTE — Unmapped (Signed)
Sent e-script to pharmacy for Envarsus per their request to [facility] located in [place].

## 2018-08-29 MED FILL — ATORVASTATIN 10 MG TABLET: 30 days supply | Qty: 30 | Fill #1 | Status: AC

## 2018-08-29 MED FILL — ATORVASTATIN 10 MG TABLET: ORAL | 30 days supply | Qty: 30 | Fill #1

## 2018-09-03 DIAGNOSIS — K769 Liver disease, unspecified: Secondary | ICD-10-CM | POA: Diagnosis not present

## 2018-09-03 DIAGNOSIS — Z944 Liver transplant status: Secondary | ICD-10-CM | POA: Diagnosis not present

## 2018-09-03 DIAGNOSIS — Z79899 Other long term (current) drug therapy: Secondary | ICD-10-CM | POA: Diagnosis not present

## 2018-09-04 LAB — COMPREHENSIVE METABOLIC PANEL
A/G RATIO: 1.8 (ref 1.2–2.2)
ALBUMIN: 4.3 g/dL (ref 3.6–4.8)
ALKALINE PHOSPHATASE: 80 IU/L (ref 39–117)
ALT (SGPT): 19 IU/L (ref 0–32)
AST (SGOT): 18 IU/L (ref 0–40)
BILIRUBIN TOTAL: 0.2 mg/dL (ref 0.0–1.2)
BUN / CREAT RATIO: 22 (ref 12–28)
CALCIUM: 8.9 mg/dL (ref 8.7–10.3)
CHLORIDE: 104 mmol/L (ref 96–106)
CO2: 21 mmol/L (ref 20–29)
CREATININE: 0.67 mg/dL (ref 0.57–1.00)
GLOBULIN, TOTAL: 2.4 g/dL (ref 1.5–4.5)
GLUCOSE: 96 mg/dL (ref 65–99)
POTASSIUM: 4.3 mmol/L (ref 3.5–5.2)
TOTAL PROTEIN: 6.7 g/dL (ref 6.0–8.5)

## 2018-09-04 LAB — CBC W/ DIFFERENTIAL
BANDED NEUTROPHILS ABSOLUTE COUNT: 0 10*3/uL (ref 0.0–0.1)
BASOPHILS ABSOLUTE COUNT: 0 10*3/uL (ref 0.0–0.2)
BASOPHILS RELATIVE PERCENT: 1 %
EOSINOPHILS RELATIVE PERCENT: 2 %
HEMATOCRIT: 32.9 % — ABNORMAL LOW (ref 34.0–46.6)
HEMOGLOBIN: 10.3 g/dL — ABNORMAL LOW (ref 11.1–15.9)
IMMATURE GRANULOCYTES: 0 %
LYMPHOCYTES ABSOLUTE COUNT: 1.6 10*3/uL (ref 0.7–3.1)
MEAN CORPUSCULAR HEMOGLOBIN CONC: 31.3 g/dL — ABNORMAL LOW (ref 31.5–35.7)
MEAN CORPUSCULAR HEMOGLOBIN: 25.5 pg — ABNORMAL LOW (ref 26.6–33.0)
MEAN CORPUSCULAR VOLUME: 81 fL (ref 79–97)
MONOCYTES ABSOLUTE COUNT: 0.6 10*3/uL (ref 0.1–0.9)
MONOCYTES RELATIVE PERCENT: 9 %
NEUTROPHILS ABSOLUTE COUNT: 4.5 10*3/uL (ref 1.4–7.0)
NEUTROPHILS RELATIVE PERCENT: 65 %
PLATELET COUNT: 479 10*3/uL — ABNORMAL HIGH (ref 150–450)
RED CELL DISTRIBUTION WIDTH: 15.8 % — ABNORMAL HIGH (ref 12.3–15.4)
WHITE BLOOD CELL COUNT: 7 10*3/uL (ref 3.4–10.8)

## 2018-09-04 LAB — PHOSPHORUS, SERUM: Lab: 2.8

## 2018-09-04 LAB — GAMMA GLUTAMYL TRANSFERASE: Lab: 52

## 2018-09-04 LAB — CHLORIDE: Lab: 104

## 2018-09-04 LAB — BILIRUBIN DIRECT: Lab: 0.07

## 2018-09-04 LAB — EOSINOPHILS ABSOLUTE COUNT: Lab: 0.2

## 2018-09-04 LAB — PHOSPHORUS: PHOSPHORUS, SERUM: 2.8 mg/dL (ref 2.5–4.5)

## 2018-09-04 LAB — MAGNESIUM: Lab: 1.6

## 2018-09-04 NOTE — Unmapped (Signed)
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     Completed refill call assessment today to schedule patient's medication shipment from the [facility] [phone]).       Specialty medication(s) and dose(s) confirmed: Regimen is correct and unchanged.   Changes to medications: July reports no changes reported at this time.  Changes to insurance: No  Questions for the pharmacist: No    The patient will receive a drug information handout for each medication shipped and additional FDA Medication Guides as required.      DISEASE/MEDICATION-SPECIFIC INFORMATION        N/A    ADHERENCE     Medication Adherence    Patient reported X missed doses in the last month:  0  Specialty Medication:  Myfortic 180mg    Patient is on additional specialty medications:  No  Patient is on more than two specialty medications:  No  Any gaps in refill history greater than 2 weeks in the last 3 months:  no  Demonstrates understanding of importance of adherence:  yes  Informant:  patient  Reliability of informant:  reliable      Adherence tools used:  patient uses a pill box to manage medications          Confirmed plan for next specialty medication refill:  delivery by pharmacy          Refill Coordination    Has the Patients' Contact Information Changed:  No  Is the Shipping Address Different:  No         MEDICARE PART B DOCUMENTATION     Myfortic 180mg : Patient has 10 days worth of tablets on hand.    SHIPPING     Shipping address confirmed in Epic.     Delivery Scheduled: Yes, Expected medication delivery date: 09/11/2018 via UPS or courier.     Medication will be delivered via UPS to the home address in [place].    Azir Muzyka P Allena Katz   [facility] Specialty Technician

## 2018-09-05 LAB — TACROLIMUS BLOOD: Lab: 2.6

## 2018-09-05 NOTE — Unmapped (Addendum)
Called patient to inquire reasons her drug level was low per request from NP Martin-Velez.     Coordinator to speak to her and her daughter.     She reported she was sick last week (week of 10-31) with a cold   runny nose, cough, and fevers, could not provide specific temperatures. No calls have been made to coordinator regarding symptoms.   She reported she got flu shot     She denies vomiting and nausea     She confirmed she missed no doses and she's taking medications at appropriate times.     Her 1 year appt is coming up Monday 11-11 with surgery.    After reviewing with NP, she would like to  confirm repeat labs on Monday and keep envarsus dose the same.   No interventions at this time.     Pt. Verbalizes understanding.

## 2018-09-09 ENCOUNTER — Ambulatory Visit: Admit: 2018-09-09 | Discharge: 2018-09-09 | Payer: MEDICARE

## 2018-09-09 ENCOUNTER — Institutional Professional Consult (permissible substitution): Admit: 2018-09-09 | Discharge: 2018-09-09 | Payer: MEDICARE

## 2018-09-09 ENCOUNTER — Ambulatory Visit: Admit: 2018-09-09 | Discharge: 2018-09-09 | Payer: MEDICARE | Attending: Registered" | Primary: Registered"

## 2018-09-09 DIAGNOSIS — Z79899 Other long term (current) drug therapy: Secondary | ICD-10-CM | POA: Diagnosis not present

## 2018-09-09 DIAGNOSIS — Z940 Kidney transplant status: Secondary | ICD-10-CM | POA: Diagnosis not present

## 2018-09-09 DIAGNOSIS — M85852 Other specified disorders of bone density and structure, left thigh: Secondary | ICD-10-CM | POA: Diagnosis not present

## 2018-09-09 DIAGNOSIS — Z780 Asymptomatic menopausal state: Secondary | ICD-10-CM | POA: Diagnosis not present

## 2018-09-09 DIAGNOSIS — Z23 Encounter for immunization: Secondary | ICD-10-CM | POA: Diagnosis not present

## 2018-09-09 DIAGNOSIS — Z7952 Long term (current) use of systemic steroids: Secondary | ICD-10-CM | POA: Diagnosis not present

## 2018-09-09 DIAGNOSIS — Z4822 Encounter for aftercare following kidney transplant: Secondary | ICD-10-CM | POA: Diagnosis not present

## 2018-09-09 DIAGNOSIS — Z853 Personal history of malignant neoplasm of breast: Secondary | ICD-10-CM | POA: Diagnosis not present

## 2018-09-09 DIAGNOSIS — D899 Disorder involving the immune mechanism, unspecified: Secondary | ICD-10-CM | POA: Diagnosis not present

## 2018-09-09 DIAGNOSIS — N271 Small kidney, bilateral: Secondary | ICD-10-CM | POA: Diagnosis not present

## 2018-09-09 DIAGNOSIS — J984 Other disorders of lung: Secondary | ICD-10-CM | POA: Diagnosis not present

## 2018-09-09 DIAGNOSIS — M8580 Other specified disorders of bone density and structure, unspecified site: Secondary | ICD-10-CM | POA: Diagnosis not present

## 2018-09-09 DIAGNOSIS — I10 Essential (primary) hypertension: Secondary | ICD-10-CM | POA: Diagnosis not present

## 2018-09-09 DIAGNOSIS — Z944 Liver transplant status: Secondary | ICD-10-CM | POA: Diagnosis not present

## 2018-09-09 DIAGNOSIS — Z4823 Encounter for aftercare following liver transplant: Secondary | ICD-10-CM | POA: Diagnosis not present

## 2018-09-09 DIAGNOSIS — R918 Other nonspecific abnormal finding of lung field: Secondary | ICD-10-CM | POA: Diagnosis not present

## 2018-09-09 DIAGNOSIS — Z9189 Other specified personal risk factors, not elsewhere classified: Secondary | ICD-10-CM

## 2018-09-09 DIAGNOSIS — Z0000 Encounter for general adult medical examination without abnormal findings: Secondary | ICD-10-CM

## 2018-09-09 DIAGNOSIS — Z794 Long term (current) use of insulin: Secondary | ICD-10-CM

## 2018-09-09 DIAGNOSIS — E669 Obesity, unspecified: Secondary | ICD-10-CM

## 2018-09-09 DIAGNOSIS — E559 Vitamin D deficiency, unspecified: Secondary | ICD-10-CM

## 2018-09-09 DIAGNOSIS — K769 Liver disease, unspecified: Secondary | ICD-10-CM

## 2018-09-09 DIAGNOSIS — B259 Cytomegaloviral disease, unspecified: Secondary | ICD-10-CM

## 2018-09-09 DIAGNOSIS — Z1159 Encounter for screening for other viral diseases: Secondary | ICD-10-CM

## 2018-09-09 DIAGNOSIS — E119 Type 2 diabetes mellitus without complications: Secondary | ICD-10-CM

## 2018-09-09 LAB — CBC W/ AUTO DIFF
BASOPHILS ABSOLUTE COUNT: 0.1 10*9/L (ref 0.0–0.1)
BASOPHILS RELATIVE PERCENT: 1.3 %
EOSINOPHILS ABSOLUTE COUNT: 0.2 10*9/L (ref 0.0–0.4)
EOSINOPHILS RELATIVE PERCENT: 2.1 %
HEMOGLOBIN: 12.3 g/dL (ref 12.0–16.0)
LARGE UNSTAINED CELLS: 2 % (ref 0–4)
LYMPHOCYTES ABSOLUTE COUNT: 1.6 10*9/L (ref 1.5–5.0)
LYMPHOCYTES RELATIVE PERCENT: 22.8 %
MEAN CORPUSCULAR HEMOGLOBIN CONC: 30.7 g/dL — ABNORMAL LOW (ref 31.0–37.0)
MEAN CORPUSCULAR HEMOGLOBIN: 25.3 pg — ABNORMAL LOW (ref 26.0–34.0)
MEAN CORPUSCULAR VOLUME: 82.4 fL (ref 80.0–100.0)
MEAN PLATELET VOLUME: 8.3 fL (ref 7.0–10.0)
MONOCYTES RELATIVE PERCENT: 6.5 %
NEUTROPHILS ABSOLUTE COUNT: 4.6 10*9/L (ref 2.0–7.5)
NEUTROPHILS RELATIVE PERCENT: 65.8 %
PLATELET COUNT: 419 10*9/L (ref 150–440)
RED BLOOD CELL COUNT: 4.86 10*12/L (ref 4.00–5.20)
RED CELL DISTRIBUTION WIDTH: 16 % — ABNORMAL HIGH (ref 12.0–15.0)
WBC ADJUSTED: 7 10*9/L (ref 4.5–11.0)

## 2018-09-09 LAB — URINALYSIS
BILIRUBIN UA: NEGATIVE
BLOOD UA: NEGATIVE
GLUCOSE UA: NEGATIVE
KETONES UA: NEGATIVE
LEUKOCYTE ESTERASE UA: NEGATIVE
NITRITE UA: NEGATIVE
PH UA: 5 (ref 5.0–9.0)
PROTEIN UA: NEGATIVE
RBC UA: <1 /HPF (ref <=4)
SPECIFIC GRAVITY UA: 1.012 (ref 1.003–1.030)
UROBILINOGEN UA: 0.2
WBC UA: 1 /HPF (ref 0–5)

## 2018-09-09 LAB — COMPREHENSIVE METABOLIC PANEL
ALBUMIN: 4.3 g/dL (ref 3.5–5.0)
ALKALINE PHOSPHATASE: 75 U/L (ref 38–126)
ALT (SGPT): 19 U/L (ref <35)
ANION GAP: 9 mmol/L (ref 7–15)
AST (SGOT): 18 U/L (ref 14–38)
BILIRUBIN TOTAL: 0.3 mg/dL (ref 0.0–1.2)
BLOOD UREA NITROGEN: 16 mg/dL (ref 7–21)
BUN / CREAT RATIO: 25
CALCIUM: 9.6 mg/dL (ref 8.5–10.2)
CHLORIDE: 103 mmol/L (ref 98–107)
CO2: 28 mmol/L (ref 22.0–30.0)
CREATININE: 0.65 mg/dL (ref 0.60–1.00)
EGFR CKD-EPI AA FEMALE: >90 mL/min/{1.73_m2} (ref >=60)
GLUCOSE RANDOM: 92 mg/dL (ref 65–99)
POTASSIUM: 4.3 mmol/L (ref 3.5–5.0)
PROTEIN TOTAL: 7 g/dL (ref 6.5–8.3)
SODIUM: 140 mmol/L (ref 135–145)

## 2018-09-09 LAB — PROTEIN/CREAT RATIO, URINE: Protein/Creatinine:MRto:Pt:Urine:Qn:: 0.076

## 2018-09-09 LAB — CMV DNA, QUANTITATIVE, PCR

## 2018-09-09 LAB — POTASSIUM: Potassium:SCnc:Pt:Ser/Plas:Qn:: 4.3

## 2018-09-09 LAB — BILIRUBIN DIRECT: Bilirubin.glucuronidated:MCnc:Pt:Ser/Plas:Qn:: <0.1

## 2018-09-09 LAB — HEPATITIS B SURFACE ANTIBODY QUANT: Hepatitis B virus surface Ab:ACnc:Pt:Ser:Qn:: <8

## 2018-09-09 LAB — HEPATITIS B SURFACE ANTIBODY: HEPATITIS B SURFACE ANTIBODY QUANT: <8 m[IU]/mL (ref <8.00)

## 2018-09-09 LAB — CMV QUANT LOG10: Lab: 0

## 2018-09-09 LAB — GAMMA GLUTAMYL TRANSFERASE: Gamma glutamyl transferase:CCnc:Pt:Ser/Plas:Qn:: 62 — ABNORMAL HIGH

## 2018-09-09 LAB — HEPATITIS B CORE TOTAL ANTIBODY: Hepatitis B virus core Ab:PrThr:Pt:Ser/Plas:Ord:IA: NONREACTIVE

## 2018-09-09 LAB — MAGNESIUM: Magnesium:MCnc:Pt:Ser/Plas:Qn:: 1.5 — ABNORMAL LOW

## 2018-09-09 LAB — PHOSPHORUS: Phosphate:MCnc:Pt:Ser/Plas:Qn:: 3

## 2018-09-09 LAB — HEPATITIS B SURFACE ANTIGEN: Hepatitis B virus surface Ag:PrThr:Pt:Ser:Ord:: NONREACTIVE

## 2018-09-09 LAB — BASOPHILS ABSOLUTE COUNT: Lab: 0.1

## 2018-09-09 LAB — SMEAR REVIEW

## 2018-09-09 LAB — ESTIMATED AVERAGE GLUCOSE: Estimated average glucose:MCnc:Pt:Bld:Qn:Estimated from glycated hemoglobin: 126

## 2018-09-09 LAB — GLUCOSE UA: Lab: NEGATIVE

## 2018-09-09 LAB — TACROLIMUS, TROUGH: Lab: 5

## 2018-09-09 LAB — PROTEIN / CREATININE RATIO, URINE: PROTEIN URINE: 5.1 mg/dL

## 2018-09-09 NOTE — Unmapped (Signed)
ANNUAL POST TRANSPLANT FOLLOW UP    CSW met with the pt and her daughter. Pt reports she need financial assistance to pay her utility bill. She had a utility paid in 04/2018. Therefore, CSW informed family that there is no guarantee that assistance would be available. Patient was asked about her future plans to afford her bill. Daughter was assisting pt, but her SSDI was suspended and she has an attorney working with her. Patient's son in law completed classes for his CDL and is currently looking for employment. Patient admits that it has been hard since her spouse died. CSW will need proof of income that daughter agreed to fax. Patient completed request form. CSW will submit all documents once proof of income is received.     Otherwise, patient appeared to be in good spirits. Daughter continues to be good social support for the patient.     Sibyl Parr, MSW, LCSW  Clinical Social Worker  [facility]   Completed: 09/09/2018

## 2018-09-09 NOTE — Unmapped (Signed)
TRANSPLANT SURGERY PROGRESS NOTE    Assessment and Plan   Patent for annul follow up. Doing well. Labs within normal limit. Had cough few weeks back, feeling better now, no fever or WBC. CXR findings noted. We will repeat in 4-6 weeks or symptom develops before. Doppler reports still awaited. Encouraged to follow up with PCP for routine medical care. Pharmacist will go over meds. She can follow up with hepatology hence onwards.    Subjective  Michelle Travis is a 62 y.o. female s/p LK transplant around a year back. She is doing well. No active symptoms except left knee pain/discomfort. She had cough few weeks back, now resolved.       Objective    Vitals:    09/09/18 1209   BP: 136/85   Pulse: 72   Temp: 36.6 ??C (97.9 ??F)   TempSrc: Tympanic   Weight: 87.3 kg (192 lb 8 oz)      Body mass index is 34.11 kg/m??.     Physical Exam:    General Appearance:    No acute distress  Lungs:                 Clear to auscultation bilaterally  Heart:                            Regular rate and rhythm  Abdomen:                 Soft, non tender, incision well healed, no e/o hernia  Extremities:               Warm and well perfused          Data Review:  All lab results last 24 hours:    Recent Results (from the past 24 hour(s))   Hemoglobin A1c    Collection Time: 09/09/18  8:50 AM   Result Value Ref Range    Hemoglobin A1C 6.0 (H) 4.8 - 5.6 %    Estimated Average Glucose 126 mg/dL   Gamma GT    Collection Time: 09/09/18  8:50 AM   Result Value Ref Range    GGT 62 (H) 11 - 48 U/L   Magnesium Level    Collection Time: 09/09/18  8:50 AM   Result Value Ref Range    Magnesium 1.5 (L) 1.6 - 2.2 mg/dL   Phosphorus Level    Collection Time: 09/09/18  8:50 AM   Result Value Ref Range    Phosphorus 3.0 2.9 - 4.7 mg/dL   Bilirubin, Direct    Collection Time: 09/09/18  8:50 AM   Result Value Ref Range    Bilirubin, Direct <0.10 0.00 - 0.40 mg/dL   Comprehensive Metabolic Panel    Collection Time: 09/09/18  8:50 AM   Result Value Ref Range Sodium 140 135 - 145 mmol/L    Potassium 4.3 3.5 - 5.0 mmol/L    Chloride 103 98 - 107 mmol/L    CO2 28.0 22.0 - 30.0 mmol/L    BUN 16 7 - 21 mg/dL    Creatinine 1.61 0.96 - 1.00 mg/dL    BUN/Creatinine Ratio 25     EGFR CKD-EPI Non-African American, Female >90 >=60 mL/min/1.83m2    EGFR CKD-EPI African American, Female >90 >=60 mL/min/1.38m2    Glucose 92 65 - 99 mg/dL    Calcium 9.6 8.5 - 04.5 mg/dL    Albumin 4.3 3.5 - 5.0 g/dL    Total  Protein 7.0 6.5 - 8.3 g/dL    Total Bilirubin 0.3 0.0 - 1.2 mg/dL    AST 18 14 - 38 U/L    ALT 19 <35 U/L    Alkaline Phosphatase 75 38 - 126 U/L    Anion Gap 9 7 - 15 mmol/L   CBC w/ Differential    Collection Time: 09/09/18  8:50 AM   Result Value Ref Range    WBC 7.0 4.5 - 11.0 10*9/L    RBC 4.86 4.00 - 5.20 10*12/L    HGB 12.3 12.0 - 16.0 g/dL    HCT 13.0 86.5 - 78.4 %    MCV 82.4 80.0 - 100.0 fL    MCH 25.3 (L) 26.0 - 34.0 pg    MCHC 30.7 (L) 31.0 - 37.0 g/dL    RDW 69.6 (H) 29.5 - 15.0 %    MPV 8.3 7.0 - 10.0 fL    Platelet 419 150 - 440 10*9/L    Neutrophils % 65.8 %    Lymphocytes % 22.8 %    Monocytes % 6.5 %    Eosinophils % 2.1 %    Basophils % 1.3 %    Absolute Neutrophils 4.6 2.0 - 7.5 10*9/L    Absolute Lymphocytes 1.6 1.5 - 5.0 10*9/L    Absolute Monocytes 0.5 0.2 - 0.8 10*9/L    Absolute Eosinophils 0.2 0.0 - 0.4 10*9/L    Absolute Basophils 0.1 0.0 - 0.1 10*9/L    Large Unstained Cells 2 0 - 4 %    Microcytosis Slight (A) Not Present    Anisocytosis Slight (A) Not Present    Hypochromasia Marked (A) Not Present   Morphology Review    Collection Time: 09/09/18  8:50 AM   Result Value Ref Range    Smear Review Comments See Comment (A) Undefined         Imaging:

## 2018-09-09 NOTE — Unmapped (Signed)
Pt ID verified with Name and Date of birth. All screening questions answered.   Vaccine(s) administered as ordered. See Immunization history for documentation. Pt tolerated the injection(s) well with no issues noted. Vaccine Information Sheet given to patient.     Urine specimen collected and sent to lab.

## 2018-09-09 NOTE — Unmapped (Signed)
[facility] TRANSPLANT CLINIC PHARMACY NOTE  09/09/2018   Michelle Travis  [id number]    Medication changes today:   1. Stop Victoza  2. Stop prednisone  3. Start calcium+D 600+400 IU BID  4. Administer Shingrix  5. Administer PCV 13  6. Adjust Envarsus goal to 4-6    Education/Adherence tools provided today:  1.provided updated medication list   2. Provided additional education on immunosuppression and transplant related medications    Follow up items:    Next visit with pharmacy in PRN  ____________________________________________________________________    Michelle Travis is a 62 y.o. 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     Post-op course complicated by DGF requiring HD from 09/14/17-09/24/17.    Other PMH significant for HTN, h/o breast cancer s/p lumpectomy (2000)    Seen by pharmacy today for: medication management ; last seen by pharmacy 3 months ago     CC:  Patient complains of not liking Victoza.  Feels tired with fluttering in her chest    There were no vitals filed for this visit.    Allergies   Allergen Reactions   ??? Latex Swelling and Rash     Discoloration of skin.   Discoloration of skin.    ??? Hydrocodone Itching   ??? Hydrocodone-Acetaminophen Itching       All medications reviewed and updated.     Medication list includes revisions made during today???s encounter    Outpatient Encounter Medications as of 09/09/2018   Medication Sig Dispense Refill   ??? acetaminophen (TYLENOL) 325 MG tablet TAKE 1 TO 2 TABLETS (325-650 MG) BY MOUTH EVERY 6 HOURS AS NEEDED FOR PAIN 100 each 0   ??? aspirin (ECOTRIN) 81 MG tablet Take 1 tablet (81 mg total) by mouth daily. 30 tablet 11   ??? atorvastatin (LIPITOR) 10 MG tablet Take 1 tablet (10 mg total) by mouth daily. 30 tablet 8   ??? blood sugar diagnostic Strp by Other route daily. 35 each 3   ??? blood-glucose meter Misc Check Blood Sugar daily in the afternoons and as needed while taking liraglutide 1 each 0   ??? cholecalciferol, vitamin D3, (CHOLECALCIFEROL) 1,000 unit tablet Take 2 tablets (2,000 Units total) by mouth daily. 60 tablet 11   ??? empty container Misc USE AS DIRECTED 1 each 2   ??? ENVARSUS XR 4 mg Tb24 extended release tablet Take 4 mg by mouth daily. Take 4 mg by mouth daily. (take one 4 mg tablet) 90 tablet 3   ??? lancets Misc Use when checking blood sugar. 100 each 0   ??? MYFORTIC 180 mg EC tablet TAKE 2 TABLETS (360 MG) BY MOUTH TWICE DAILY 120 tablet 11   ??? pantoprazole (PROTONIX) 40 MG tablet TAKE 1 TABLET BY MOUTH ONCE DAILY 30 tablet PRN   ??? pen needle, diabetic 32 gauge x 1/4 Ndle 1 each by Miscellaneous route daily. 90 each 3   ??? [DISCONTINUED] atorvastatin (LIPITOR) 10 MG tablet Take 1 tablet (10 mg total) by mouth daily. 30 tablet 11   ??? [DISCONTINUED] liraglutide (VICTOZA) injection pen Inject 0.1 mL (0.6 mg total) under the skin daily. 36 mL 0   ??? [DISCONTINUED] pantoprazole (PROTONIX) 40 MG tablet Take 1 tablet (40 mg total) by mouth daily as needed. 30 tablet PRN   ??? [DISCONTINUED] predniSONE (DELTASONE) 2.5 MG tablet Take 1 tablet (2.5 mg total) by mouth daily. 30 tablet 2   ??? [  DISCONTINUED] sodium polystyrene, SPS, with sorbitol 15-20 gram/60 mL Susp TAKE ( 15G TOTAL ) BY MOUTH ONCE DAILY FOR 2 DAYS 120 mL 0   ??? [DISCONTINUED] tbo-filgrastim (GRANIX) 300 mcg/0.5 mL Syrg injection INJECT THE CONTENTS OF 1 SYRINGE UNDER THE SKIN AS DIRECTED WHEN WEEKLY LABS SHOW ANC OF 0.9 OR BELOW 3 mL 0     No facility-administered encounter medications on file as of 09/09/2018.        Induction agent : basiliximab    CURRENT IMMUNOSUPPRESSION: Envarsus 4 mg PO daily  Prograf goal: 6-8   myfortic 360 mg BID    prednisone 2.5 mg PO daily for 1 year per liver/kidney protocol    Patient's still has a tremor and HA 3-4 xs week with Envarsus, although HA were present prior to transplant.      IMMUNOSUPPRESSION DRUG LEVELS:  Lab Results   Component Value Date    Tacrolimus, Trough 5.0 09/09/2018    Tacrolimus, Trough 8.1 07/08/2018    Tacrolimus, Trough 6.8 04/24/2018    Tacrolimus, Trough 6.5 04/24/2018    Tacrolimus Lvl 2.6 09/03/2018    Tacrolimus Lvl 9.3 08/21/2018    Tacrolimus Lvl 4.6 08/12/2018     Lab Results   Component Value Date    Cyclosporine, Trough <10 (L) 04/24/2018     No results found for: EVEROLIMUS  No results found for: SIROLIMUS    Envarsus level was not drawn today       Graft function: stable  DSA: ntd  Biopsies to date: 09/13/17 - liver zero-hour biopsy unremarkable  WBC/ANC:  wnl    Plan: Will stop prednisone and adjust Envarsus goal to 4-6. Continue to monitor.    ID prophylaxis:   CMV Status: D+/ R+, moderate risk . CMV prophylaxis: valganciclovir 450 mg daily x 3 months per protocol - completed  Lab Results   Component Value Date    CMV Quant <50 (H) 03/18/2018    CMV Quant Negative 11/28/2017   Serum creatinine: 0.65 mg/dL 96/29/52 8413  Estimated creatinine clearance: 94.1 mL/min  PCP Prophylaxis: bactrim SS 1 tab MWF x 6 months complete  Thrush:  completed in hospital  Patient is  tolerating infectious prophylaxis well    Plan: Completed per protocol. Continue to monitor.    CV Prophylaxis: asa 81 mg   The 10-year ASCVD risk score Denman George DC Jr., et al., 2013) is: 8.9%  Statin therapy: Indicated; currently on atorvastatin 10 mg daily  Plan: Continue to monitor     BP: Goal < 140/90. Clinic vitals reported above  Home BP ranges: Does not regularly at home  Current meds include: none  Plan:Continue to monitor    Anemia:  H/H:   Lab Results   Component Value Date    HGB 12.3 09/09/2018     Lab Results   Component Value Date    HCT 40.1 09/09/2018     Iron panel:  Lab Results   Component Value Date    IRON 142 06/12/2017    TIBC 325.6 06/12/2017    FERRITIN 83.5 06/12/2017     Lab Results   Component Value Date    Iron Saturation (%) 44 06/12/2017   Prior ESA use: none documented post-transplant in [facility]  Plan: stable. Continue to monitor.     DM:   Lab Results   Component Value Date    A1C 6.0 (H) 09/09/2018   . Goal A1c < 7  History of Dm? No   Diet:  Reports increased appetite on prednisone and weight gain of 20 lb since February. Met with transplant dietitian today who suggested to stop eating in the middle of the night, have a nutritious bedtime snack instead of chips, and increase physical activity.  Established with endocrinologist/PCP for BG managment? No  Plan: Incorporate diet/exercise recommendations per dietititan. Continue to monitor    Electrolytes: wnl  Meds currently on: none  Plan: Continue to monitor     GI/BM: Pt reports 1 BM per day, but usually has 2-3 per day.   Meds currently on: pantoprazole 40 mg PO daily  Plan:  Continue to monitor    Pain: pt reports HA a couple times per week. HA have improved to some extent with change to Envarsus  Meds currently on: APAP PRN for HA  Plan: Continue to monitor    Bone health:   Vitamin D Level: 27.3. Goal > 30.   Last DEXA results:  09/09/18 - femoral neck density osteopenia   Current meds include: Cholecalciferol 2,000 units once daily   Plan: Vitamin D level out of goal. Start Calcium +D 600/400IU BID.Continue to monitor.     Women's/Men's Health:  Michelle Travis is a 62 y.o. Female perimenopausal. Patient reports no men's/women's health issues  Plan: Continue to monitor    Adherence: Patient has average understanding of medications; was not able to independently identify names/doses of immunosuppressants and OI meds.  Patient  has been filling her own pill box for the past few weeks as daughter has been busy  Patient brought medication card:yes  Pill box:did not bring  Plan: Encouraged patient to continue to fill pill box on her own and continue to familiarize herself with her medications.  Provided basic adherence counseling/intervention    Spent approximately 30 minutes on educating this patient and greater than 50% was spent in direct face to face counseling regarding post transplant medication education. Questions and concerns were address to patient's satisfaction.    Patient was reviewed with Dr.Desai who was agreement with the stated plan:     During this visit, the following was completed:   BG log data assessment  BP log data assessment  Labs ordered and evaluated  complex treatment plan >1 DS   Patient education was completed for 11-24 minutes     All questions/concerns were addressed to the patient's satisfaction.    __________________________________________  Cleone Slim, PHARMD  [employer]ID [employer] TRANSPLANT  PAGER [phone]

## 2018-09-09 NOTE — Unmapped (Signed)
Outpatient Adult Nutrition - Transplant Follow-up     Reason for Visit: s/p liver/kidney transplant 09/13/17    PMH:   Patient Active Problem List   Diagnosis   ??? Hypertension (RAF-HCC)   ??? Chronic back pain   ??? History of breast cancer 2000 s/p lumpectomy   ??? Sleep apnea   ??? Membranous Nephropathy with segmental scarring   ??? Rectus sheath hematoma   ??? Liver replaced by transplant (CMS-HCC)   ??? Kidney transplanted   ??? Aftercare following organ transplant     Anthropometric Data:  -- Height: 160 cm (5' 2.99)   -- Last recorded weight: 87.3 kg (192 lb 8 oz)  -- IBW: 52.21 kg  -- Percent IBW: 167%  -- AdjIBW: 60.9 kg  -- BMI: Body mass index is 34.11 kg/m??.   -- Weight changes this admission:   Last 5 Recorded Weights    09/09/18 1100   Weight: 87.3 kg (192 lb 8 oz)      -- Weight history: significant wt gain since transplant. However, maintaining for the past ~2 months.  Wt Readings from Last 10 Encounters:   09/09/18 87.3 kg (192 lb 8 oz)   09/09/18 87.3 kg (192 lb 8 oz)   07/08/18 87.9 kg (193 lb 11.2 oz)   04/24/18 83.5 kg (184 lb)   03/18/18 83.9 kg (185 lb)   03/18/18 83.9 kg (185 lb)   03/18/18 83.9 kg (185 lb)   12/17/17 73.5 kg (162 lb)   12/17/17 73.7 kg (162 lb 7.7 oz)   12/17/17 73.7 kg (162 lb 6.4 oz)     Nutrition Focused Physical Exam:                        Nutrition Evaluation  Overall Impressions: Nutrition-Focused Physical Exam not indicated due to lack of malnutrition risk factors. (09/10/18 1027)  Nutrition Designation: Obese class I  (BMI 30.00 - 34.99 kg/m2) (09/10/18 1027)    Relevant Medications, Herbs, Supplements include: Vit D3    Relevant Labs: reviewed; Mg 1.5  Lab Results   Component Value Date    A1C 6.0 (H) 09/09/2018     Physical Activity: pt is walking 1 mile around the track, once a week. Doing house chores, playing with grandkids etc. Resistant to increasing physical activity.    Dietary Restrictions: nuts and small seeds    Allergies:   Allergies   Allergen Reactions   ??? Latex Swelling and Rash     Discoloration of skin.   Discoloration of skin.    ??? Hydrocodone Itching   ??? Hydrocodone-Acetaminophen Itching     Hunger and Satiety: good    Usual Intake:   1st - 10am - egg, pancake, bacon sometimes   Snack - n/a  2nd - apple every now and then  Snack - n/a  3rd - 3-4 pm - meat or fish w/ vegetable - small portion per daughter  Snack - popcorn, apple  Beverages - water    Nutrition History/Assessment: pt remains resistant to diet education, saying today, you make me work too hard. Several reasons behind inability to increase physical activity and modify dietary behaviors. She continues to skip lunch, which lends to extreme feelings of hunger and mindless eating in the evening. Discussed eating every ~4-5 hrs to avoid feelings of extreme hunger and subsequent overeating. Fried chicken in her purse in clinic.    Social: accompanied by daughter and another caregiver     Estimated Daily  Nutritional Needs:   Estimated Energy Needs: 1265-1437 kcal/day (22-25 kcal/kg adjusted BW)  Estimated Protein Needs: 69-86 g pro/day (1.2-1.5 g/kg adjusted BW)  Estimated Fluid Needs: per MD    Malnutrition Assessment using AND/ASPEN Clinical Characteristics:  Patient does not meet AND/ASPEN criteria for malnutrition at this time (09/10/18 1031)                      Education: weight loss towards UBW    Nutrition Goals:  Sustainable weight change of  0.5-1 lb loss/wk towards UBW  Meet nutritional needs   Reduce long-term health risk  Improve strength/endurance    Interventions:   1. Eat 3 meals daily with appropriate protein  2. Increase physical activity - aim to walk for 30 minutes at least 2x weekly  3. Bake meats instead of frying    Materials Provided were:  Tips and suggestions - verbal  RD Contact Info    Follow-up: PRN    Length of visit was: 15 minutes    Jackqulyn Livings MPH, RD, LDN  Pager: [phone]

## 2018-09-10 LAB — VITAMIN D, TOTAL (25OH): Lab: 27.3

## 2018-09-10 LAB — VITAMIN D 25 HYDROXY: VITAMIN D, TOTAL (25OH): 27.3 ng/mL (ref 20.0–80.0)

## 2018-09-10 MED FILL — PANTOPRAZOLE 40 MG TABLET,DELAYED RELEASE: ORAL | 30 days supply | Qty: 30 | Fill #2

## 2018-09-10 MED FILL — MYFORTIC 180 MG TABLET,DELAYED RELEASE: 30 days supply | Qty: 120 | Fill #2

## 2018-09-10 MED FILL — PANTOPRAZOLE 40 MG TABLET,DELAYED RELEASE: 30 days supply | Qty: 30 | Fill #2 | Status: AC

## 2018-09-10 MED FILL — MYFORTIC 180 MG TABLET,DELAYED RELEASE: 30 days supply | Qty: 120 | Fill #2 | Status: AC

## 2018-09-11 LAB — HCV RNA COMMENT: Lab: 0

## 2018-09-11 LAB — HEPATITIS C RNA, QUANTITATIVE, PCR: HCV RNA: NOT DETECTED

## 2018-09-12 LAB — HLA DS POST TRANSPLANT
ANTI-DONOR HLA-A #1 MFI: 0 MFI
ANTI-DONOR HLA-A #2 MFI: 11 MFI
ANTI-DONOR HLA-B #2 MFI: 0 MFI
ANTI-DONOR HLA-C #1 MFI: 11 MFI
ANTI-DONOR HLA-C #2 MFI: 0 MFI
ANTI-DONOR HLA-DP AG #1 MFI: 244 MFI
ANTI-DONOR HLA-DQB #1 MFI: 30 MFI
ANTI-DONOR HLA-DQB #2 MFI: 0 MFI
ANTI-DONOR HLA-DR #1 MFI: 69 MFI
ANTI-DONOR HLA-DR #2 MFI: 0 MFI

## 2018-09-12 LAB — HLA CLASS 1 ANTIBODY

## 2018-09-12 LAB — FSAB CLASS 1 ANTIBODY SPECIFICITY

## 2018-09-12 LAB — DONOR HLA-DR ANTIGEN #2

## 2018-09-12 LAB — HLA CL2 AB COMMENT: Lab: 0

## 2018-09-12 NOTE — Unmapped (Signed)
CSW spoke with Shawna Orleans with [employer] to discuss delaying disconnection for 11/20 at this time. JR funds will be used to assist the pt. Manager/Melanie [phone] wanted to CM call her back on 11/18 with name of other pts or accounts that have been assisted for verification purposes. CM agreed, but will discuss with Ed Agnoli/admin.

## 2018-09-13 LAB — BK BLOOD QUANT: Lab: 0

## 2018-09-13 LAB — BK VIRUS QUANTITATIVE PCR, BLOOD: BK BLOOD RESULT: NOT DETECTED

## 2018-09-16 NOTE — Unmapped (Signed)
Patient seen with Dr. Celine Mans.  Patient in clinic with her daughter. Patient denies changes in primary care provider, contact information, insurance, lab and pharmacy.  Patient Denies use of alcohol, tobacco and controlled substances.  The patient denies current or previous tobacco use.    Patient currentlyon disability.  Patient denies recent hospitalizations.   Patient denies nausea, vomiting, diarrhea, constipation, night sweats,  fevers, chills, shortness of breath, extremity swelling and ascites. Patient was recently sick with congestions and a cough.   Patient drinking plenty of fluids per report.  Patient appetite is doing well. Patient reports doing small amounts of exercise including stationary bike.    Patient educated 15 minutes Educated about wearing long sleves, hat, and using sunblock to prevent skin cancer. Educated to visit dermatologist yearly. Educated about how to identify possible skin cancer lesions. Educated about the increased risk of cancer due to immunosuppression medication and the benefits in routine cancer screenings. Educated about use of tobacco products and  immunosuppression medication increasing cancer risk.   Educated about need for labs. Educated about importance of diet and exercise since the liver can have fat deposited with excess weight. Educated about the importance of using a PCP.   Educated about increase water intake to hydrate the kidneys.    Patient taking Brand Envarsus daily. 11-10 nighttime and took her medications after labs drawn 11-11. Sent e-script for transplant medications to preferred pharmacy. Patient met with Pharmacy to go over medication list. Reviewed labs. Patient to return to clinic 1 year for CXR, Liver US and Hepatology.  Lab draws to be weekly, will follow up to decrease frequency. Pt. Goes to [facility].   Per MD patient to follow up a chest xray in 4-6 weeks.   Patient received flu shot, Prevnar 13 and Shringrix vaccines.   She reports being up to date with colonoscopy and mammogram as of 2019.  Questions and concerns addressed.   See Providers note for further detail.

## 2018-09-16 NOTE — Unmapped (Signed)
CSW spoke with [employer] manager/Melanie about pt's account. She repeatedly stated that she needed the name of a pt [facility] has assisted in the past. CSW tried to explain that it would be a HIPPA violation. Rep states that is there process. She states she approved it this time, but in the future be prepared to provided the requested info. She also suggested that [facility] make a payment via phone with credit card. CSW thanked rep.     CSW informed pt.

## 2018-09-17 NOTE — Unmapped (Signed)
Spoke with patient regarding starting a Calcium+D supplement BID, per recommendation from Pharm D after reviewing Vitamin D level and bone density scan.   Pt. Verbalized understanding.

## 2018-09-17 NOTE — Unmapped (Addendum)
Patient called just confirming about dose of calcium + D supplement   1200 mg and 25 mcg BID  Will confirm with Pharm D and notify patient that this supplement is safe.   Confirmed with patient now she can get monthly labs   Also noted that patient needs f/u chest xray done locally will send request to [facility] for scheduling.

## 2018-09-18 NOTE — Unmapped (Signed)
error 

## 2018-09-19 NOTE — Unmapped (Signed)
Called patient to discuss chest x-ray appt locally, unable to reach patient.  Left detailed VM informing patient she can walk in for the appointment as per [facility] front desk representative, provided patient with phone number [phone] for [facility] if she has questions/concern. Provided patient with my call back number [phone], if she has any questions/concern.     Chest-ray order faxed to [facility] at fax: [phone], phone: [phone] via Epic letter.

## 2018-09-20 ENCOUNTER — Other Ambulatory Visit (HOSPITAL_COMMUNITY): Payer: Self-pay | Admitting: Transplant Surgery

## 2018-09-20 ENCOUNTER — Ambulatory Visit (HOSPITAL_COMMUNITY)
Admission: RE | Admit: 2018-09-20 | Discharge: 2018-09-20 | Disposition: A | Discharge status: Home / Self Care | Payer: Medicare HMO | Source: Ambulatory Visit | Attending: Transplant Surgery | Admitting: Transplant Surgery

## 2018-09-20 DIAGNOSIS — R918 Other nonspecific abnormal finding of lung field: Secondary | ICD-10-CM | POA: Diagnosis not present

## 2018-09-20 DIAGNOSIS — R05 Cough: Secondary | ICD-10-CM | POA: Insufficient documentation

## 2018-09-20 DIAGNOSIS — R059 Cough, unspecified: Secondary | ICD-10-CM

## 2018-09-30 NOTE — Unmapped (Signed)
[facility] Refill and Clinical Coordination Note  Medication(s): Myfortic 180mg     Michelle Travis, DOB: [birth year]  Phone: [phone] (home) [phone] (work), Alternate phone contact: N/A  Shipping address: [address]  [place] [address]  Phone or address changes today?: No  All above HIPAA information verified.  Insurance changes? No    Completed refill and clinical call assessment today to schedule patient's medication shipment from the [facility] [phone]).      MEDICATION RECONCILIATION    Confirmed the medication and dosage are correct and have not changed: Yes, regimen is correct and unchanged.    Were there any changes to your medication(s) in the past month:  No, there are no changes reported at this time.    ADHERENCE    Is this medicine transplant or covered by Medicare Part B? Yes.    Myfortic 180mg : Patient has 13 days worth of  tablets on hand.    Did you miss any doses in the past 4 weeks? No missed doses reported.  Adherence counseling provided? Not needed     SIDE EFFECT MANAGEMENT    Are you tolerating your medication?:  Michelle Travis reports tolerating the medication.  Side effect management discussed: None      Therapy is appropriate and should be continued.    Evidence of clinical benefit: See Epic note from 09/09/18      FINANCIAL/SHIPPING    Delivery Scheduled: Yes, Expected medication delivery date: 10/03/18     Medication will be delivered via UPS to the home address in [place].    Additional medications refilled: pantoprazole, atorvastatin    The patient will receive a drug information handout for each medication shipped and additional FDA Medication Guides as required.      Michelle Travis did not have any additional questions at this time.    Delivery address confirmed in Epic.     We will follow up with patient monthly for standard refill processing and delivery.      Thank you,  [name]   [facility] Specialty Pharmacist

## 2018-09-30 NOTE — Unmapped (Signed)
In reviewing patients Outside chest xray results with NP Martin-velez, no further interventions needed.   Pt. Had improvement on follow up chest xray from previous exam.

## 2018-10-02 MED FILL — ATORVASTATIN 10 MG TABLET: ORAL | 30 days supply | Qty: 30 | Fill #2

## 2018-10-02 MED FILL — ATORVASTATIN 10 MG TABLET: 30 days supply | Qty: 30 | Fill #2 | Status: AC

## 2018-10-03 MED FILL — PANTOPRAZOLE 40 MG TABLET,DELAYED RELEASE: ORAL | 30 days supply | Qty: 30 | Fill #3

## 2018-10-03 MED FILL — MYFORTIC 180 MG TABLET,DELAYED RELEASE: 30 days supply | Qty: 120 | Fill #3

## 2018-10-03 MED FILL — PANTOPRAZOLE 40 MG TABLET,DELAYED RELEASE: 30 days supply | Qty: 30 | Fill #3 | Status: AC

## 2018-10-03 MED FILL — MYFORTIC 180 MG TABLET,DELAYED RELEASE: 30 days supply | Qty: 120 | Fill #3 | Status: AC

## 2018-10-10 DIAGNOSIS — Z79899 Other long term (current) drug therapy: Secondary | ICD-10-CM | POA: Diagnosis not present

## 2018-10-10 DIAGNOSIS — K769 Liver disease, unspecified: Secondary | ICD-10-CM | POA: Diagnosis not present

## 2018-10-10 DIAGNOSIS — Z944 Liver transplant status: Secondary | ICD-10-CM | POA: Diagnosis not present

## 2018-10-11 LAB — POTASSIUM: Lab: 4.5

## 2018-10-11 LAB — CBC W/ DIFFERENTIAL
BANDED NEUTROPHILS ABSOLUTE COUNT: 0 10*3/uL (ref 0.0–0.1)
EOSINOPHILS ABSOLUTE COUNT: 0.2 10*3/uL (ref 0.0–0.4)
EOSINOPHILS RELATIVE PERCENT: 3 %
HEMATOCRIT: 36.6 % (ref 34.0–46.6)
HEMOGLOBIN: 11.6 g/dL (ref 11.1–15.9)
IMMATURE GRANULOCYTES: 0 %
LYMPHOCYTES ABSOLUTE COUNT: 1.6 10*3/uL (ref 0.7–3.1)
LYMPHOCYTES RELATIVE PERCENT: 27 %
MEAN CORPUSCULAR HEMOGLOBIN CONC: 31.7 g/dL (ref 31.5–35.7)
MEAN CORPUSCULAR HEMOGLOBIN: 25 pg — ABNORMAL LOW (ref 26.6–33.0)
MEAN CORPUSCULAR VOLUME: 79 fL (ref 79–97)
MONOCYTES ABSOLUTE COUNT: 0.7 10*3/uL (ref 0.1–0.9)
MONOCYTES RELATIVE PERCENT: 12 %
NEUTROPHILS ABSOLUTE COUNT: 3.3 10*3/uL (ref 1.4–7.0)
NEUTROPHILS RELATIVE PERCENT: 57 %
PLATELET COUNT: 305 10*3/uL (ref 150–450)
RED BLOOD CELL COUNT: 4.64 x10E6/uL (ref 3.77–5.28)
WHITE BLOOD CELL COUNT: 5.8 10*3/uL (ref 3.4–10.8)

## 2018-10-11 LAB — COMPREHENSIVE METABOLIC PANEL
A/G RATIO: 2 (ref 1.2–2.2)
ALBUMIN: 4.3 g/dL (ref 3.6–4.8)
ALKALINE PHOSPHATASE: 69 IU/L (ref 39–117)
ALT (SGPT): 20 IU/L (ref 0–32)
AST (SGOT): 26 IU/L (ref 0–40)
BILIRUBIN TOTAL: 0.3 mg/dL (ref 0.0–1.2)
BLOOD UREA NITROGEN: 17 mg/dL (ref 8–27)
CALCIUM: 9 mg/dL (ref 8.7–10.3)
CHLORIDE: 105 mmol/L (ref 96–106)
CO2: 20 mmol/L (ref 20–29)
CREATININE: 0.75 mg/dL (ref 0.57–1.00)
GLOBULIN, TOTAL: 2.1 g/dL (ref 1.5–4.5)
GLUCOSE: 104 mg/dL — ABNORMAL HIGH (ref 65–99)
TOTAL PROTEIN: 6.4 g/dL (ref 6.0–8.5)

## 2018-10-11 LAB — MONOCYTES ABSOLUTE COUNT: Lab: 0.7

## 2018-10-11 LAB — BILIRUBIN DIRECT: Lab: 0.1

## 2018-10-11 LAB — GAMMA GLUTAMYL TRANSFERASE: Lab: 48

## 2018-10-11 LAB — PHOSPHORUS, SERUM: Lab: 2.8

## 2018-10-11 LAB — MAGNESIUM: Lab: 1.3 — ABNORMAL LOW

## 2018-10-11 NOTE — Unmapped (Signed)
TRF form

## 2018-10-13 LAB — TACROLIMUS BLOOD: Lab: 9.1

## 2018-10-14 NOTE — Unmapped (Addendum)
Called patient to follow up on her increased tacrolimus level. Left detailed VM requesting a call back   Patient called back and confirmed she hasn't started any new medications or drinking any grapefruit juices, patient continues to have headaches.     After reviewing Elevated tacrolimus level with Bertram Denver, she wants to repeat in 1 month.   ??   Notified patient patient of plan, she verbalized understanding.     No interventions at this time.

## 2018-11-01 NOTE — Unmapped (Signed)
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     Completed refill call assessment today to schedule patient's medication shipment from the [facility] [phone]).       Specialty medication(s) and dose(s) confirmed: Regimen is correct and unchanged.   Changes to medications: Michelle Travis reports no changes reported at this time.  Changes to insurance: No  Questions for the pharmacist: No    The patient will receive a drug information handout for each medication shipped and additional FDA Medication Guides as required.      DISEASE/MEDICATION-SPECIFIC INFORMATION        N/A    ADHERENCE     Medication Adherence    Patient reported X missed doses in the last month:  0  Specialty Medication:  Myfortic 180mg    Patient is on additional specialty medications:  No  Patient is on more than two specialty medications:  No  Any gaps in refill history greater than 2 weeks in the last 3 months:  no  Demonstrates understanding of importance of adherence:  yes  Informant:  patient  Reliability of informant:  reliable      Adherence tools used:  patient uses a pill box to manage medications          Confirmed plan for next specialty medication refill:  delivery by pharmacy          Refill Coordination    Has the Patients' Contact Information Changed:  No  Is the Shipping Address Different:  No         MEDICARE PART B DOCUMENTATION     Myfortic 180mg : Patient has 8 days worth of tablets on hand.    SHIPPING     Shipping address confirmed in Epic.     Delivery Scheduled: Yes, Expected medication delivery date: 11/07/2018 via UPS or courier.     Medication will be delivered via UPS to the home address in [place].    Yordi Krager P Allena Katz   [facility] Specialty Technician

## 2018-11-06 DIAGNOSIS — Z944 Liver transplant status: Secondary | ICD-10-CM | POA: Diagnosis not present

## 2018-11-06 DIAGNOSIS — Z940 Kidney transplant status: Secondary | ICD-10-CM | POA: Diagnosis not present

## 2018-11-06 DIAGNOSIS — M79605 Pain in left leg: Secondary | ICD-10-CM | POA: Diagnosis not present

## 2018-11-06 DIAGNOSIS — Z79899 Other long term (current) drug therapy: Secondary | ICD-10-CM | POA: Diagnosis not present

## 2018-11-06 DIAGNOSIS — K769 Liver disease, unspecified: Secondary | ICD-10-CM | POA: Diagnosis not present

## 2018-11-06 DIAGNOSIS — M79602 Pain in left arm: Secondary | ICD-10-CM | POA: Diagnosis not present

## 2018-11-06 MED FILL — PANTOPRAZOLE 40 MG TABLET,DELAYED RELEASE: ORAL | 30 days supply | Qty: 30 | Fill #4

## 2018-11-06 MED FILL — MYFORTIC 180 MG TABLET,DELAYED RELEASE: 30 days supply | Qty: 120 | Fill #4

## 2018-11-06 MED FILL — MYFORTIC 180 MG TABLET,DELAYED RELEASE: 30 days supply | Qty: 120 | Fill #4 | Status: AC

## 2018-11-06 MED FILL — PANTOPRAZOLE 40 MG TABLET,DELAYED RELEASE: 30 days supply | Qty: 30 | Fill #4 | Status: AC

## 2018-11-06 MED FILL — ATORVASTATIN 10 MG TABLET: ORAL | 30 days supply | Qty: 30 | Fill #3

## 2018-11-06 MED FILL — ATORVASTATIN 10 MG TABLET: 30 days supply | Qty: 30 | Fill #3 | Status: AC

## 2018-11-06 NOTE — Unmapped (Signed)
Patient called inquiring about what medication she can take for her knee pain. She recently was seen by her PCP and MD wanted to prescribed 800mg  of Ibrupofen. TNC informed her that this is NOT safe in a liver/kidney transplant patient. After consulting with Pharm D Mincemoyer patient can safely take tylenol and topical creams/ointments such as Voltaren, TNC informed patient of this medication and spelled out Voltaren.     Also, if the provider wants to prescribe something stronger such as tramadol that is safe, but at his discretion. Patient informed of this as well, but explained she won't take anything then. I advised her to consult her PCP for additional concerns about her knee pain.   Pt verbalized understanding.

## 2018-11-07 LAB — CBC W/ DIFFERENTIAL
BANDED NEUTROPHILS ABSOLUTE COUNT: 0 10*3/uL (ref 0.0–0.1)
BASOPHILS ABSOLUTE COUNT: 0 10*3/uL (ref 0.0–0.2)
BASOPHILS RELATIVE PERCENT: 1 %
EOSINOPHILS ABSOLUTE COUNT: 0.2 10*3/uL (ref 0.0–0.4)
EOSINOPHILS RELATIVE PERCENT: 3 %
HEMATOCRIT: 38.6 % (ref 34.0–46.6)
IMMATURE GRANULOCYTES: 0 %
LYMPHOCYTES ABSOLUTE COUNT: 1.8 10*3/uL (ref 0.7–3.1)
LYMPHOCYTES RELATIVE PERCENT: 32 %
MEAN CORPUSCULAR HEMOGLOBIN CONC: 31.1 g/dL — ABNORMAL LOW (ref 31.5–35.7)
MEAN CORPUSCULAR HEMOGLOBIN: 25.1 pg — ABNORMAL LOW (ref 26.6–33.0)
MEAN CORPUSCULAR VOLUME: 81 fL (ref 79–97)
MONOCYTES ABSOLUTE COUNT: 0.6 10*3/uL (ref 0.1–0.9)
MONOCYTES RELATIVE PERCENT: 10 %
NEUTROPHILS ABSOLUTE COUNT: 3.1 10*3/uL (ref 1.4–7.0)
NEUTROPHILS RELATIVE PERCENT: 54 %
RED BLOOD CELL COUNT: 4.79 x10E6/uL (ref 3.77–5.28)
RED CELL DISTRIBUTION WIDTH: 15.2 % (ref 11.7–15.4)
WHITE BLOOD CELL COUNT: 5.6 10*3/uL (ref 3.4–10.8)

## 2018-11-07 LAB — COMPREHENSIVE METABOLIC PANEL
A/G RATIO: 2 (ref 1.2–2.2)
ALBUMIN: 4.5 g/dL (ref 3.6–4.8)
ALKALINE PHOSPHATASE: 62 IU/L (ref 39–117)
ALT (SGPT): 15 IU/L (ref 0–32)
AST (SGOT): 22 IU/L (ref 0–40)
BILIRUBIN TOTAL: 0.4 mg/dL (ref 0.0–1.2)
BLOOD UREA NITROGEN: 13 mg/dL (ref 8–27)
BUN / CREAT RATIO: 18 (ref 12–28)
CALCIUM: 9.3 mg/dL (ref 8.7–10.3)
CHLORIDE: 105 mmol/L (ref 96–106)
CO2: 21 mmol/L (ref 20–29)
GLOBULIN, TOTAL: 2.2 g/dL (ref 1.5–4.5)
GLUCOSE: 98 mg/dL (ref 65–99)
POTASSIUM: 4.8 mmol/L (ref 3.5–5.2)
SODIUM: 142 mmol/L (ref 134–144)
TOTAL PROTEIN: 6.7 g/dL (ref 6.0–8.5)

## 2018-11-07 LAB — GAMMA GLUTAMYL TRANSFERASE: Lab: 46

## 2018-11-07 LAB — MAGNESIUM: Lab: 1.5 — ABNORMAL LOW

## 2018-11-07 LAB — MONOCYTES ABSOLUTE COUNT: Lab: 0.6

## 2018-11-07 LAB — ALBUMIN: Lab: 4.5

## 2018-11-07 LAB — PHOSPHORUS, SERUM: Lab: 2.8

## 2018-11-07 LAB — BILIRUBIN DIRECT: Lab: 0.12

## 2018-11-09 LAB — TACROLIMUS BLOOD: Lab: 8.3

## 2018-11-10 NOTE — Unmapped (Signed)
Received on call page. Called back and spoke to pt who reported she took a double dose of her anti-rejection meds this morning thinking she had not already taken them. Advised her to skip the dose tonight and resume taking meds tomorrow morning. She uses a pill box but got confused this morning. Routed info to primary coordinator.

## 2018-11-12 NOTE — Unmapped (Signed)
After reviewing recent lab results with NP Martin-Velez and Pharm D Mincemoyer, patient Envarsus to decrease to 3 mg daily     TNC to speak with patient and patient did confirm she has been taking her medication as prescribed prior to her Jan 8 th blood draw.   She denies any changes.     Patient medication decreased, patient verbalizes her medication change and updated Rx sent.  Pt confirms she has 1 mg at home.     Requested repeat labs in 1 week.

## 2018-11-13 MED ORDER — ENVARSUS XR 1 MG TABLET,EXTENDED RELEASE
ORAL_TABLET | Freq: Every day | ORAL | 3 refills | 0.00000 days | Status: CP
Start: 2018-11-13 — End: 2018-11-25

## 2018-11-20 DIAGNOSIS — Z79899 Other long term (current) drug therapy: Secondary | ICD-10-CM | POA: Diagnosis not present

## 2018-11-20 DIAGNOSIS — K769 Liver disease, unspecified: Secondary | ICD-10-CM | POA: Diagnosis not present

## 2018-11-20 DIAGNOSIS — Z944 Liver transplant status: Secondary | ICD-10-CM | POA: Diagnosis not present

## 2018-11-21 LAB — CBC W/ DIFFERENTIAL
BANDED NEUTROPHILS ABSOLUTE COUNT: 0 10*3/uL (ref 0.0–0.1)
BASOPHILS ABSOLUTE COUNT: 0.1 10*3/uL (ref 0.0–0.2)
EOSINOPHILS ABSOLUTE COUNT: 0.2 10*3/uL (ref 0.0–0.4)
EOSINOPHILS RELATIVE PERCENT: 4 %
HEMATOCRIT: 39.2 % (ref 34.0–46.6)
HEMOGLOBIN: 11.9 g/dL (ref 11.1–15.9)
IMMATURE GRANULOCYTES: 0 %
LYMPHOCYTES ABSOLUTE COUNT: 1.8 10*3/uL (ref 0.7–3.1)
LYMPHOCYTES RELATIVE PERCENT: 33 %
MEAN CORPUSCULAR HEMOGLOBIN: 24.1 pg — ABNORMAL LOW (ref 26.6–33.0)
MEAN CORPUSCULAR VOLUME: 79 fL (ref 79–97)
MONOCYTES ABSOLUTE COUNT: 0.6 10*3/uL (ref 0.1–0.9)
MONOCYTES RELATIVE PERCENT: 11 %
NEUTROPHILS ABSOLUTE COUNT: 2.8 10*3/uL (ref 1.4–7.0)
NEUTROPHILS RELATIVE PERCENT: 51 %
PLATELET COUNT: 364 10*3/uL (ref 150–450)
RED BLOOD CELL COUNT: 4.94 x10E6/uL (ref 3.77–5.28)
RED CELL DISTRIBUTION WIDTH: 15.1 % (ref 11.7–15.4)
WHITE BLOOD CELL COUNT: 5.5 10*3/uL (ref 3.4–10.8)

## 2018-11-21 LAB — COMPREHENSIVE METABOLIC PANEL
A/G RATIO: 1.9 (ref 1.2–2.2)
ALBUMIN: 4.5 g/dL (ref 3.8–4.8)
ALKALINE PHOSPHATASE: 61 IU/L (ref 39–117)
ALT (SGPT): 38 IU/L — ABNORMAL HIGH (ref 0–32)
AST (SGOT): 21 IU/L (ref 0–40)
BILIRUBIN TOTAL: 0.4 mg/dL (ref 0.0–1.2)
BLOOD UREA NITROGEN: 15 mg/dL (ref 8–27)
CHLORIDE: 103 mmol/L (ref 96–106)
CO2: 22 mmol/L (ref 20–29)
CREATININE: 0.72 mg/dL (ref 0.57–1.00)
GLOBULIN, TOTAL: 2.4 g/dL (ref 1.5–4.5)
GLUCOSE: 104 mg/dL — ABNORMAL HIGH (ref 65–99)
POTASSIUM: 4.7 mmol/L (ref 3.5–5.2)
SODIUM: 140 mmol/L (ref 134–144)
TOTAL PROTEIN: 6.9 g/dL (ref 6.0–8.5)

## 2018-11-21 LAB — MONOCYTES RELATIVE PERCENT: Lab: 11

## 2018-11-21 LAB — GAMMA GLUTAMYL TRANSFERASE: Lab: 53

## 2018-11-21 LAB — MAGNESIUM: Lab: 1.6

## 2018-11-21 LAB — AST (SGOT): Lab: 21

## 2018-11-21 LAB — PHOSPHORUS, SERUM: Lab: 3.2

## 2018-11-21 LAB — BILIRUBIN DIRECT: Lab: 0.15

## 2018-11-23 LAB — TACROLIMUS BLOOD: Lab: 8.4

## 2018-11-26 MED ORDER — ENVARSUS XR 1 MG TABLET,EXTENDED RELEASE
ORAL_TABLET | Freq: Every day | ORAL | 3 refills | 0 days
Start: 2018-11-26 — End: 2018-12-27

## 2018-11-26 NOTE — Unmapped (Signed)
After reviewing recent lab values with Seward Speck, PharmD  Bertram Denver, FNP   And patient still has high Tacrolimus troughs. Patient verbalizes she is taking her Envarsus once daily and NOT taking before her lab draws.     Patient instructed to Decrease Envarsus to 2 mg daily once.     TNC to ask for lab repeat in 1 week.     Patient verbalizes understanding.

## 2018-11-26 NOTE — Unmapped (Signed)
Ms. Nims called to discuss upcoming local appointments. She has ortho appointment for knee pain followed by vascular access surgery for arm pain associated with her AVF (never used since transplant with good graft function).     Offered her the option of [place] referrals if she is not satisfied with local options. Encouraged her to share with local provider that she cannot take NSAIDs for pain and reiterated tylenol guidelines, no more than 3g/day.     She appreciated my support.         Gertie Fey, DNP, APRN, FNP-C  [facility] for [facility]  [address]  [place], [place]  [address]

## 2018-11-27 NOTE — Unmapped (Addendum)
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     Michelle Travis, DOB: April 14, 1956  Phone: [phone] (home) [phone] (work)    All above HIPAA information was verified with patient.     Completed refill call assessment today to schedule patient's medication shipment from the [facility] [phone]).       Specialty medication(s) and dose(s) confirmed: Regimen is correct and unchanged.   Changes to medications: Shanda reports no changes reported at this time.  Changes to insurance: No  Questions for the pharmacist: No    The patient will receive a drug information handout for each medication shipped and additional FDA Medication Guides as required.      DISEASE/MEDICATION-SPECIFIC INFORMATION        N/A    ADHERENCE     Medication Adherence    Patient reported X missed doses in the last month:  0  Specialty Medication:  Envarsus & Myfortic 180mg   Patient is on additional specialty medications:  No  Patient is on more than two specialty medications:  No  Any gaps in refill history greater than 2 weeks in the last 3 months:  no  Demonstrates understanding of importance of adherence:  yes  Informant:  patient  Reliability of informant:  reliable  Adherence tools used:  patient uses a pill box to manage medications  Confirmed plan for next specialty medication refill:  delivery by pharmacy          Refill Coordination    Has the Patients' Contact Information Changed:  No  Is the Shipping Address Different:  No         MEDICARE PART B DOCUMENTATION     Myfortic 180mg : Patient has 8 days worth of tablets on hand.    SHIPPING     Shipping address confirmed in Epic.     Delivery Scheduled: Yes, Expected medication delivery date: 12/05/2018 via UPS or courier.     Medication will be delivered via UPS to the home address in [place].    Pietra Zuluaga P Allena Katz   [facility] Specialty Technician

## 2018-11-29 ENCOUNTER — Other Ambulatory Visit: Payer: Self-pay

## 2018-11-29 DIAGNOSIS — G458 Other transient cerebral ischemic attacks and related syndromes: Secondary | ICD-10-CM

## 2018-11-29 DIAGNOSIS — Z992 Dependence on renal dialysis: Principal | ICD-10-CM

## 2018-11-29 DIAGNOSIS — N186 End stage renal disease: Secondary | ICD-10-CM

## 2018-12-04 DIAGNOSIS — Z944 Liver transplant status: Secondary | ICD-10-CM | POA: Diagnosis not present

## 2018-12-04 DIAGNOSIS — K769 Liver disease, unspecified: Secondary | ICD-10-CM | POA: Diagnosis not present

## 2018-12-04 DIAGNOSIS — Z79899 Other long term (current) drug therapy: Secondary | ICD-10-CM | POA: Diagnosis not present

## 2018-12-04 MED FILL — PANTOPRAZOLE 40 MG TABLET,DELAYED RELEASE: ORAL | 30 days supply | Qty: 30 | Fill #5

## 2018-12-04 MED FILL — MYFORTIC 180 MG TABLET,DELAYED RELEASE: 30 days supply | Qty: 120 | Fill #5

## 2018-12-04 MED FILL — MYFORTIC 180 MG TABLET,DELAYED RELEASE: 30 days supply | Qty: 120 | Fill #5 | Status: AC

## 2018-12-04 MED FILL — ATORVASTATIN 10 MG TABLET: 30 days supply | Qty: 30 | Fill #4 | Status: AC

## 2018-12-04 MED FILL — ATORVASTATIN 10 MG TABLET: ORAL | 30 days supply | Qty: 30 | Fill #4

## 2018-12-04 MED FILL — PANTOPRAZOLE 40 MG TABLET,DELAYED RELEASE: 30 days supply | Qty: 30 | Fill #5 | Status: AC

## 2018-12-04 NOTE — Unmapped (Signed)
Standing lab orders for [facility] entered into EPIC.

## 2018-12-05 LAB — COMPREHENSIVE METABOLIC PANEL
A/G RATIO: 2.3 — ABNORMAL HIGH (ref 1.2–2.2)
ALBUMIN: 4.5 g/dL (ref 3.8–4.8)
ALKALINE PHOSPHATASE: 66 IU/L (ref 39–117)
ALT (SGPT): 19 IU/L (ref 0–32)
AST (SGOT): 27 IU/L (ref 0–40)
BILIRUBIN TOTAL: 0.3 mg/dL (ref 0.0–1.2)
BLOOD UREA NITROGEN: 9 mg/dL (ref 8–27)
BUN / CREAT RATIO: 14 (ref 12–28)
CALCIUM: 9.4 mg/dL (ref 8.7–10.3)
CO2: 20 mmol/L (ref 20–29)
POTASSIUM: 4.5 mmol/L (ref 3.5–5.2)
SODIUM: 142 mmol/L (ref 134–144)
TOTAL PROTEIN: 6.5 g/dL (ref 6.0–8.5)

## 2018-12-05 LAB — CBC W/ DIFFERENTIAL
BASOPHILS ABSOLUTE COUNT: 0.1 10*3/uL (ref 0.0–0.2)
BASOPHILS RELATIVE PERCENT: 1 %
EOSINOPHILS ABSOLUTE COUNT: 0.2 10*3/uL (ref 0.0–0.4)
EOSINOPHILS RELATIVE PERCENT: 3 %
HEMATOCRIT: 37.8 % (ref 34.0–46.6)
HEMOGLOBIN: 11.9 g/dL (ref 11.1–15.9)
IMMATURE GRANULOCYTES: 0 %
LYMPHOCYTES ABSOLUTE COUNT: 1.4 10*3/uL (ref 0.7–3.1)
LYMPHOCYTES RELATIVE PERCENT: 26 %
MEAN CORPUSCULAR HEMOGLOBIN CONC: 31.5 g/dL (ref 31.5–35.7)
MEAN CORPUSCULAR HEMOGLOBIN: 25.3 pg — ABNORMAL LOW (ref 26.6–33.0)
MEAN CORPUSCULAR VOLUME: 80 fL (ref 79–97)
MONOCYTES ABSOLUTE COUNT: 0.6 10*3/uL (ref 0.1–0.9)
MONOCYTES RELATIVE PERCENT: 10 %
NEUTROPHILS ABSOLUTE COUNT: 3.3 10*3/uL (ref 1.4–7.0)
NEUTROPHILS RELATIVE PERCENT: 60 %
PLATELET COUNT: 357 10*3/uL (ref 150–450)
RED BLOOD CELL COUNT: 4.71 x10E6/uL (ref 3.77–5.28)
RED CELL DISTRIBUTION WIDTH: 15.2 % (ref 11.7–15.4)

## 2018-12-05 LAB — MAGNESIUM: Lab: 1.5 — ABNORMAL LOW

## 2018-12-05 LAB — GAMMA GLUTAMYL TRANSFERASE: Lab: 49

## 2018-12-05 LAB — BILIRUBIN DIRECT: Lab: 0.11

## 2018-12-05 LAB — CREATININE: Lab: 0.65

## 2018-12-05 LAB — PHOSPHORUS, SERUM: Lab: 3

## 2018-12-05 LAB — WHITE BLOOD CELL COUNT: Lab: 5.5

## 2018-12-05 NOTE — Unmapped (Addendum)
Per NP Martin-Velez sent recommendation to patients PCP to start her on something for pre-diabetic control.  Since the Victoza was tolerated on low dose, but due to tachycardia had to be discontinued.  This is due to her fasting blood glucoses are now persistently over 100 and we do not want to burn our her transplanted kidney or put fat on her liver.  PCP to manage since patient only comes to [facility] once a year now. Sent message to PCP with lab results.

## 2018-12-06 LAB — TACROLIMUS BLOOD: Lab: 3.3

## 2018-12-11 ENCOUNTER — Encounter: Payer: Self-pay | Admitting: Orthopedic Surgery

## 2018-12-11 ENCOUNTER — Ambulatory Visit (INDEPENDENT_AMBULATORY_CARE_PROVIDER_SITE_OTHER): Payer: Medicare HMO

## 2018-12-11 ENCOUNTER — Ambulatory Visit: Payer: Medicare HMO | Admitting: Orthopedic Surgery

## 2018-12-11 VITALS — BP 135/84 | HR 62 | Ht 63.0 in | Wt 194.0 lb

## 2018-12-11 DIAGNOSIS — M1712 Unilateral primary osteoarthritis, left knee: Secondary | ICD-10-CM | POA: Diagnosis not present

## 2018-12-11 DIAGNOSIS — G8929 Other chronic pain: Secondary | ICD-10-CM

## 2018-12-11 DIAGNOSIS — M25562 Pain in left knee: Secondary | ICD-10-CM | POA: Diagnosis not present

## 2018-12-11 MED ORDER — ACETAMINOPHEN-CODEINE #3 300-30 MG PO TABS: 1.0000 | ORAL_TABLET | Freq: Four times a day (QID) | ORAL | 0 refills | Status: DC | PRN

## 2018-12-11 NOTE — Unmapped (Signed)
Patient to repeat labs in 2 weeks due to tac level being outside of goal.

## 2018-12-11 NOTE — Unmapped (Signed)
Patient called requesting ortho referral as her local ortho provider said they would not operate on her given post-liver and kidney transplant status.     Placed referral for [place] ortho surgery.       Gertie Fey, DNP, APRN, FNP-C  [facility] for [facility]  [address]  [place], [place]  [address]

## 2018-12-11 NOTE — Progress Notes (Signed)
EVANY SCHECTER  12/11/2018  HISTORY SECTION :  Chief Complaint  Patient presents with  . Knee Pain    left   63 years old complains of difficulty walking standing climbing stairs really avoids them, she does have a cane.  She has trouble getting out of a chair getting up and off the toilet.  The patient presents for evaluation of painful left knee  Location diffuse left knee pain Duration 7 to 8 months Quality dull ache Severity rates her pain 10 out of 10 Associated with stiffness   Review of Systems  Constitutional: Negative for chills, fever, malaise/fatigue and weight loss.  Musculoskeletal: Positive for back pain and joint pain.  Skin: Negative.     Past Medical History:  Diagnosis Date  . Bilateral leg edema   . Brain aneurysm   . Breast disorder    right breast cancer 2002  . Cancer [facility])    breast cancer- lumpectromy, , chemotherapy, radiation  . Chronic back pain Diverticultis  . Chronic kidney disease (CKD) stage G3b/A3, moderately decreased glomerular filtration rate (GFR) between 30-44 mL/min/1.73 square meter and albuminuria creatinine ratio greater than 300 mg/g (HCC) 08/30/2016  . 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 DDD (degenerative disc disease), lumbar   . Diverticulosis    scope 2014  . ETOH abuse   . Hepatic encephalopathy ([place]) 04/23/2017  . Hepatomegaly    scope 2014  . History of breast cancer 06/07/2016  . Hypertension   . 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 Pneumonia   . Rotator cuff syndrome of left shoulder   . Sleep apnea    does not wear CPAP  . Thickened endometrium 06/19/2016   Will get endo biopsy     Past Surgical History:  Procedure Laterality Date  . AV FISTULA PLACEMENT Left 05/22/2017   Procedure: BRACHIAL ARTERY TO BASCILIC VEIN  FISTULA CREATION;  Surgeon: Elam Dutch, MD;  Location: [place];  Service: Vascular;  Laterality:  Left;  . BASCILIC VEIN TRANSPOSITION Left 08/07/2017   Procedure: BASILIC VEIN TRANSPOSITION SECOND STAGE;  Surgeon: Elam Dutch, MD;  Location: [place];  Service: Vascular;  Laterality: Left;  . BRAIN SURGERY     aneurysm at [facility] Right    cancer- lunmmnpectomy-   . CATARACT EXTRACTION Bilateral    [facility] 2 or 3 years ago  . CHOLECYSTECTOMY    . COLONOSCOPY N/A 04/10/2013   Procedure: COLONOSCOPY;  Surgeon: Rogene Houston, MD;  [facility];  Service: Endoscopy;  Laterality: N/A;  1030-rescheduled to 1200 Ann notified pt  . COLONOSCOPY N/A 08/07/2018   Procedure: COLONOSCOPY;  Surgeon: Rogene Houston, MD;  [facility];  Service: Endoscopy;  Laterality: N/A;  930  . ESOPHAGOGASTRODUODENOSCOPY N/A 04/22/2015   Procedure: ESOPHAGOGASTRODUODENOSCOPY (EGD);  Surgeon: Rogene Houston, MD;  [facility];  Service: Endoscopy;  Laterality: N/A;  . EYE SURGERY    . IR GENERIC HISTORICAL  07/04/2016   IR RADIOLOGIST EVAL & MGMT 07/04/2016 Jacqulynn Cadet, MD [facility] INTERV RAD  . IR RADIOLOGIST EVAL & MGMT  07/24/2017  . KIDNEY SURGERY    . KNEE ARTHROSCOPY Left   . LIVER TRANSPLANT    . POLYPECTOMY  08/07/2018   Procedure: POLYPECTOMY;  Surgeon: Rogene Houston, MD;  [facility];  Service: Endoscopy;;  colon   . RADIOLOGY WITH ANESTHESIA N/A 07/16/2015  Procedure: TIPS;  Surgeon: Medication Radiologist, MD;  Location: [place];  Service: Radiology;  Laterality: N/A;  . ROTATOR CUFF REPAIR Left   . TOTAL KNEE ARTHROPLASTY Right    right. 2002    Social History   Tobacco Use  . Smoking status: Never Smoker  . Smokeless tobacco: Never Used  Substance Use Topics  . Alcohol use: No    Alcohol/week: 0.0 standard drinks    Comment: none since 03/2015  . Drug use: No    Family History  Problem Relation Age of Onset  . Aneurysm Mother   . Other Daughter        knee replacement  . Hypertension Daughter       Allergies   Allergen Reactions  . Latex Swelling and Other (See Comments)    Discoloration of skin.   . Vicodin [Hydrocodone-Acetaminophen] Itching     Current Outpatient Medications:  .  aspirin EC 81 MG tablet, Take 1 tablet (81 mg total) by mouth daily., Disp: , Rfl:  .  atorvastatin (LIPITOR) 10 MG tablet, , Disp: , Rfl:  .  cholecalciferol (VITAMIN D) 1000 units tablet, Take 1,000 Units by mouth daily., Disp: , Rfl:  .  ENVARSUS XR 1 MG TB24, Take 2 mg by mouth daily. , Disp: , Rfl:  .  ENVARSUS XR 4 MG TB24, Take 4 mg by mouth daily., Disp: , Rfl:  .  mycophenolate (MYFORTIC) 180 MG EC tablet, Take 360 mg by mouth 2 (two) times daily., Disp: , Rfl:  .  pantoprazole (PROTONIX) 40 MG tablet, Take 40 mg by mouth daily., Disp: , Rfl:  .  acetaminophen-codeine (TYLENOL #3) 300-30 MG tablet, Take 1 tablet by mouth every 6 (six) hours as needed for moderate pain., Disp: 30 tablet, Rfl: 0 .  predniSONE (DELTASONE) 2.5 MG tablet, Take 2.5 mg by mouth daily., Disp: , Rfl: 2   PHYSICAL EXAM SECTION: BP 135/84   Pulse 62   Ht 5\' 3"  (1.6 m)   Wt 194 lb (88 kg)   BMI 34.37 kg/m   Body mass index is 34.37 kg/m. General appearance: Well-developed well-nourished no gross deformities  Eyes clear normal vision no evidence of conjunctivitis or jaundice, extraocular muscles intact  ENT: ears hearing abnormal, poor in both ears right worse than left, nasal passages clear, throat clear   Lymph nodes: No lymphadenopathy lower extremities  Neck is supple without palpable mass, full range of motion  Cardiovascular normal pulse and perfusion in all 4 extremities normal color without edema  Neurologically deep tendon reflexes are equal and normal, no sensation loss or deficits no pathologic reflexes  Psychological: Awake alert and oriented x3 mood and affect normal  Skin no lacerations or ulcerations no nodularity no palpable masses, no erythema or nodularity  Musculoskeletal:  Range of motion left  knee is 5 to 95 degrees with tenderness diffusely along the joint lines and peripatellar region quadricep strength is normal there is no instability on drawer testing skin is normal with several scars from prior surgery  Right knee previous knee replacement 0 to 90 degree range of motion normal quadriceps function is stable and there is no instability.  Skin is normal otherwise.  Tenderness.   MEDICAL DECISION SECTION:  Encounter Diagnoses  Name Primary?  . Chronic pain of left knee Yes  . Primary osteoarthritis of left knee     Imaging 3 views left knee  Plan:  (Rx., Inj., surg., Frx, MRI/CT, XR:2) We discussed the possible treatment options  for this patient she had her transplant done at [facility].  She is not a candidate to have surgery in [place].  She would like to have further nonoperative therapy.  To date she has tried Tylenol with no improvement.  She is wearing a brace at night.  She had arthroscopy of the knee in late 1990s or early 2000   I did inject the knee Procedure note left knee injection verbal consent was obtained to inject left knee joint  Timeout was completed to confirm the site of injection  The medications used were 40 mg of Depo-Medrol and 1% lidocaine 3 cc  Anesthesia was provided by ethyl chloride and the skin was prepped with alcohol.  After cleaning the skin with alcohol a 20-gauge needle was used to inject the left knee joint. There were no complications. A sterile bandage was applied.   I gave her a prescription of codeine Referred her to pain management  If she has to have surgery knee replacement this should be done at tertiary care center.  10:33 AM

## 2018-12-11 NOTE — Patient Instructions (Addendum)
Knee Injection A knee injection is a procedure to get medicine into your knee joint to relieve the pain, swelling, and stiffness of arthritis. Your health care provider uses a needle to inject medicine, which may also help to lubricate and cushion your knee joint. You may need more than one injection. Tell a health care provider about:  Any allergies you have.  All medicines you are taking, including vitamins, herbs, eye drops, creams, and over-the-counter medicines.  Any problems you or family members have had with anesthetic medicines.  Any blood disorders you have.  Any surgeries you have had.  Any medical conditions you have.  Whether you are pregnant or may be pregnant. What are the risks? Generally, this is a safe procedure. However, problems may occur, including:  Infection.  Bleeding.  Symptoms that get worse.  Damage to the area around your knee.  Allergic reaction to any of the medicines.  Skin reactions from repeated injections. What happens before the procedure?  Ask your health care provider about changing or stopping your regular medicines. This is especially important if you are taking diabetes medicines or blood thinners.  Plan to have someone take you home from the hospital or clinic. What happens during the procedure?   You will sit or lie down in a position for your knee to be treated.  The skin over your kneecap will be cleaned with a germ-killing soap.  You will be given a medicine that numbs the area (local anesthetic). You may feel some stinging.  The medicine will be injected into your knee. The needle is carefully placed between your kneecap and your knee. The medicine is injected into the joint space.  The needle will be removed at the end of the procedure.  A bandage (dressing) may be placed over the injection site. The procedure may vary among health care providers and hospitals. What can I expect after the procedure?  Your blood  pressure, heart rate, breathing rate, and blood oxygen level will be monitored until you leave the hospital or clinic.  You may have to move your knee through its full range of motion. This helps to get all the medicine into your joint space.  You will be watched to make sure that you do not have a reaction to the injected medicine.  You may feel more pain, swelling, and warmth than you did before the injection. This reaction may last about 1-2 days. Follow these instructions at home: Medicines  Take over-the-counter and prescription medicines only as told by your doctor.  Do not drive or use heavy machinery while taking prescription pain medicine.  Do not take medicines such as aspirin and ibuprofen unless your health care provider tells you to take them. Injection site care  Follow instructions from your health care provider about: ? How to take care of your puncture site. ? When and how you should change your dressing. ? When you should remove your dressing.  Check your injection area every day for signs of infection. Check for: ? More redness, swelling, or pain after 2 days. ? Fluid or blood. ? Pus or a bad smell. ? Warmth. Managing pain, stiffness, and swelling   If directed, put ice on the injection area: ? Put ice in a plastic bag. ? Place a towel between your skin and the bag. ? Leave the ice on for 20 minutes, 2-3 times per day.  Do not apply heat to your knee.  Raise (elevate) the injection area above the level   of your heart while you are sitting or lying down. General instructions  If you were given a dressing, keep it dry until your health care provider says it can be removed. Ask your health care provider when you can start showering or taking a bath.  Avoid strenuous activities for as long as directed by your health care provider. Ask your health care provider when you can return to your normal activities.  Keep all follow-up visits as told by your health  care provider. This is important. You may need more injections. Contact a health care provider if you have:  A fever.  Warmth in your injection area.  Fluid, blood, or pus coming from your injection site.  Symptoms at your injection site that last longer than 2 days after your procedure. Get help right away if:  Your knee: ? Turns very red. ? Becomes very swollen. ? Is in severe pain. Summary  A knee injection is a procedure to get medicine into your knee joint to relieve the pain, swelling, and stiffness of arthritis.  A needle is carefully placed between your kneecap and your knee to inject medicine into the joint space.  Before the procedure, ask your health care provider about changing or stopping your regular medicines, especially if you are taking diabetes medicines or blood thinners.  Contact your health care provider if you have any problems or questions after your procedure. This information is not intended to replace advice given to you by your health care provider. Make sure you discuss any questions you have with your health care provider. Document Released: 01/07/2007 Document Revised: 11/05/2017 Document Reviewed: 11/05/2017 Elsevier Interactive Patient Education  2019 [employer].   Appointment will be made for pain management  You can start codeine 1 tablet every 6 hours as needed pain  Make sure you are monitoring your weight   Arthritis Arthritis means joint pain. It can also mean joint disease. A joint is a place where bones come together. People who have arthritis may have:  Red joints.  Swollen joints.  Stiff joints.  Warm joints.  A fever.  A feeling of being sick. Follow these instructions at home: Pay attention to any changes in your symptoms. Take these actions to help with your pain and swelling. Medicines  Take over-the-counter and prescription medicines only as told by your doctor.  Do not take aspirin for pain if your doctor says  that you may have gout. Activity  Rest your joint if your doctor tells you to.  Avoid activities that make the pain worse.  Exercise your joint regularly as told by your doctor. Try doing exercises like: ? Swimming. ? Water aerobics. ? Biking. ? Walking. Joint Care   If your joint is swollen, keep it raised (elevated) if told by your doctor.  If your joint feels stiff in the morning, try taking a warm shower.  If you have diabetes, do not apply heat without asking your doctor.  If told, apply heat to the joint: ? Put a towel between the joint and the hot pack or heating pad. ? Leave the heat on the area for 20-30 minutes.  If told, apply ice to the joint: ? Put ice in a plastic bag. ? Place a towel between your skin and the bag. ? Leave the ice on for 20 minutes, 2-3 times per day.  Keep all follow-up visits as told by your doctor. Contact a doctor if:  The pain gets worse.  You have a fever.  Get help right away if:  You have very bad pain in your joint.  You have swelling in your joint.  Your joint is red.  Many joints become painful and swollen.  You have very bad back pain.  Your leg is very weak.  You cannot control your pee (urine) or poop (stool). This information is not intended to replace advice given to you by your health care provider. Make sure you discuss any questions you have with your health care provider. Document Released: 01/10/2010 Document Revised: 03/23/2016 Document Reviewed: 01/11/2015 Elsevier Interactive Patient Education  2019 [employer].

## 2018-12-12 ENCOUNTER — Encounter: Payer: Self-pay | Admitting: Vascular Surgery

## 2018-12-12 ENCOUNTER — Ambulatory Visit (INDEPENDENT_AMBULATORY_CARE_PROVIDER_SITE_OTHER): Payer: Medicare HMO | Admitting: Vascular Surgery

## 2018-12-12 ENCOUNTER — Other Ambulatory Visit: Payer: Self-pay

## 2018-12-12 ENCOUNTER — Ambulatory Visit (HOSPITAL_COMMUNITY)
Admission: RE | Admit: 2018-12-12 | Discharge: 2018-12-12 | Disposition: A | Discharge status: Home / Self Care | Payer: Medicare HMO | Source: Ambulatory Visit | Attending: Family | Admitting: Family

## 2018-12-12 VITALS — BP 121/85 | HR 68 | Temp 97.5°F | Resp 16 | Ht 63.0 in | Wt 193.5 lb

## 2018-12-12 DIAGNOSIS — N186 End stage renal disease: Secondary | ICD-10-CM

## 2018-12-12 DIAGNOSIS — T82898A Other specified complication of vascular prosthetic devices, implants and grafts, initial encounter: Secondary | ICD-10-CM

## 2018-12-12 DIAGNOSIS — Z992 Dependence on renal dialysis: Secondary | ICD-10-CM | POA: Insufficient documentation

## 2018-12-12 NOTE — H&P (View-Only) (Signed)
Patient is a 63 year old female who complains of aching numbness and tingling in her left hand.  This gets worse at nighttime.  She had a left basilic vein transposition fistula placed in October 2018.  He states the pain has been worse over the last month.  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  She is not on hemodialysis.  She has never used the AV fistula.  Social History   Socioeconomic History  . Marital status: Married    Spouse name: Not on file  . Number of children: Not on file  . Years of education: Not on file  . Highest education level: Not on file  Occupational History  . Occupation: disabled  Social Needs  . Financial resource strain: Not on file  . Food insecurity:    Worry: Not on file    Inability: Not on file  . Transportation needs:    Medical: Not on file    Non-medical: Not on file  Tobacco Use  . Smoking status: Never Smoker  . Smokeless tobacco: Never Used  Substance and Sexual Activity  . Alcohol use: No    Alcohol/week: 0.0 standard drinks    Comment: none since 03/2015  . Drug use: No  . Sexual activity: Not Currently    Birth control/protection: Post-menopausal  Lifestyle  . Physical activity:    Days per week: Not on file    Minutes per session: Not on file  . Stress: Not on file  Relationships  . Social connections:    Talks on phone: Not on file    Gets together: Not on file    Attends religious service: Not on file    Active member of club or organization: Not on file    Attends meetings of clubs or organizations: Not on file    Relationship status: Not on file  . Intimate partner violence:    Fear of current or ex partner: Not on file    Emotionally abused: Not on file    Physically abused: Not on file    Forced sexual activity: Not on file  Other Topics Concern  . 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 However he is fairly capable of taking care of himself at home. This couple have an attentive daughter who keeps an eye on both of them.    Past Medical History:  Diagnosis Date  . Bilateral leg edema   . Brain aneurysm   . Breast disorder    right breast cancer 2002  . Cancer [facility])    breast cancer- lumpectromy, , chemotherapy, radiation  . Chronic back pain Diverticultis  . Chronic kidney disease (CKD) stage G3b/A3, moderately decreased glomerular filtration rate (GFR) between 30-44 mL/min/1.73 square meter and albuminuria creatinine ratio greater than 300 mg/g (HCC) 08/30/2016  . 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 DDD (degenerative disc disease), lumbar   . Diverticulosis    scope 2014  . ETOH abuse   . Hepatic encephalopathy ([place]) 04/23/2017  . Hepatomegaly    scope 2014  . History of breast cancer 06/07/2016  . Hypertension   . 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 Pneumonia   . Rotator cuff syndrome of left shoulder   . Sleep apnea    does not wear CPAP  . Thickened  endometrium 06/19/2016   Will get endo biopsy    Past Surgical History:  Procedure Laterality Date  . AV FISTULA PLACEMENT Left 05/22/2017   Procedure: BRACHIAL ARTERY TO BASCILIC VEIN  FISTULA CREATION;  Surgeon: Elam Dutch, MD;  Location: [place];  Service: Vascular;  Laterality: Left;  . BASCILIC VEIN TRANSPOSITION Left 08/07/2017   Procedure: BASILIC VEIN TRANSPOSITION SECOND STAGE;  Surgeon: Elam Dutch, MD;  Location: [place];  Service: Vascular;  Laterality: Left;  . BRAIN SURGERY     aneurysm at [place] Right    cancer- lunmmnpectomy-   . CATARACT EXTRACTION Bilateral    [facility] 2 or 3 years ago  . CHOLECYSTECTOMY    . COLONOSCOPY N/A 04/10/2013   Procedure: COLONOSCOPY;  Surgeon: Rogene Houston, MD;  [facility];  Service: Endoscopy;  Laterality: N/A;  1030-rescheduled  to 1200 Ann notified pt  . COLONOSCOPY N/A 08/07/2018   Procedure: COLONOSCOPY;  Surgeon: Rogene Houston, MD;  [facility];  Service: Endoscopy;  Laterality: N/A;  930  . ESOPHAGOGASTRODUODENOSCOPY N/A 04/22/2015   Procedure: ESOPHAGOGASTRODUODENOSCOPY (EGD);  Surgeon: Rogene Houston, MD;  [facility];  Service: Endoscopy;  Laterality: N/A;  . EYE SURGERY    . IR GENERIC HISTORICAL  07/04/2016   IR RADIOLOGIST EVAL & MGMT 07/04/2016 Jacqulynn Cadet, MD [facility] INTERV RAD  . IR RADIOLOGIST EVAL & MGMT  07/24/2017  . KIDNEY SURGERY    . KNEE ARTHROSCOPY Left   . LIVER TRANSPLANT    . POLYPECTOMY  08/07/2018   Procedure: POLYPECTOMY;  Surgeon: Rogene Houston, MD;  [facility];  Service: Endoscopy;;  colon   . RADIOLOGY WITH ANESTHESIA N/A 07/16/2015   Procedure: TIPS;  Surgeon: Medication Radiologist, MD;  Location: [place];  Service: Radiology;  Laterality: N/A;  . ROTATOR CUFF REPAIR Left   . TOTAL KNEE ARTHROPLASTY Right    right. 2002     Review of systems: She denies shortness of breath.  She denies chest pain.  She does have some occasional pain in the right hand and occasional numbness and tingling but that is different from the left hand.  She thinks this is related to overuse of the right hand compensating for the left.  Data: Patient had a duplex ultrasound today which showed evidence of steal in the left hand.  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  The patient currently does not need the fistula has a functioning transplant.  Plan: Ligation left arm AV fistula.  The patient had jury  duty coming up soon.  She will call to schedule this at her convenience.  Ruta Hinds, MD [facility] [place] Office: [phone] Pager: [phone]

## 2018-12-12 NOTE — Progress Notes (Signed)
Patient is a 63 year old female who complains of aching numbness and tingling in her left hand.  This gets worse at nighttime.  She had a left basilic vein transposition fistula placed in October 2018.  He states the pain has been worse over the last month.  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  She is not on hemodialysis.  She has never used the AV fistula.  Social History   Socioeconomic History  . Marital status: Married    Spouse name: Not on file  . Number of children: Not on file  . Years of education: Not on file  . Highest education level: Not on file  Occupational History  . Occupation: disabled  Social Needs  . Financial resource strain: Not on file  . Food insecurity:    Worry: Not on file    Inability: Not on file  . Transportation needs:    Medical: Not on file    Non-medical: Not on file  Tobacco Use  . Smoking status: Never Smoker  . Smokeless tobacco: Never Used  Substance and Sexual Activity  . Alcohol use: No    Alcohol/week: 0.0 standard drinks    Comment: none since 03/2015  . Drug use: No  . Sexual activity: Not Currently    Birth control/protection: Post-menopausal  Lifestyle  . Physical activity:    Days per week: Not on file    Minutes per session: Not on file  . Stress: Not on file  Relationships  . Social connections:    Talks on phone: Not on file    Gets together: Not on file    Attends religious service: Not on file    Active member of club or organization: Not on file    Attends meetings of clubs or organizations: Not on file    Relationship status: Not on file  . Intimate partner violence:    Fear of current or ex partner: Not on file    Emotionally abused: Not on file    Physically abused: Not on file    Forced sexual activity: Not on file  Other Topics Concern  . 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 However he is fairly capable of taking care of himself at home. This couple have an attentive daughter who keeps an eye on both of them.    Past Medical History:  Diagnosis Date  . Bilateral leg edema   . Brain aneurysm   . Breast disorder    right breast cancer 2002  . Cancer [facility])    breast cancer- lumpectromy, , chemotherapy, radiation  . Chronic back pain Diverticultis  . Chronic kidney disease (CKD) stage G3b/A3, moderately decreased glomerular filtration rate (GFR) between 30-44 mL/min/1.73 square meter and albuminuria creatinine ratio greater than 300 mg/g (HCC) 08/30/2016  . 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 DDD (degenerative disc disease), lumbar   . Diverticulosis    scope 2014  . ETOH abuse   . Hepatic encephalopathy ([place]) 04/23/2017  . Hepatomegaly    scope 2014  . History of breast cancer 06/07/2016  . Hypertension   . 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 Pneumonia   . Rotator cuff syndrome of left shoulder   . Sleep apnea    does not wear CPAP  . Thickened  endometrium 06/19/2016   Will get endo biopsy    Past Surgical History:  Procedure Laterality Date  . AV FISTULA PLACEMENT Left 05/22/2017   Procedure: BRACHIAL ARTERY TO BASCILIC VEIN  FISTULA CREATION;  Surgeon: Elam Dutch, MD;  Location: [place];  Service: Vascular;  Laterality: Left;  . BASCILIC VEIN TRANSPOSITION Left 08/07/2017   Procedure: BASILIC VEIN TRANSPOSITION SECOND STAGE;  Surgeon: Elam Dutch, MD;  Location: [place];  Service: Vascular;  Laterality: Left;  . BRAIN SURGERY     aneurysm at [place] Right    cancer- lunmmnpectomy-   . CATARACT EXTRACTION Bilateral    [facility] 2 or 3 years ago  . CHOLECYSTECTOMY    . COLONOSCOPY N/A 04/10/2013   Procedure: COLONOSCOPY;  Surgeon: Rogene Houston, MD;  [facility];  Service: Endoscopy;  Laterality: N/A;  1030-rescheduled  to 1200 Ann notified pt  . COLONOSCOPY N/A 08/07/2018   Procedure: COLONOSCOPY;  Surgeon: Rogene Houston, MD;  [facility];  Service: Endoscopy;  Laterality: N/A;  930  . ESOPHAGOGASTRODUODENOSCOPY N/A 04/22/2015   Procedure: ESOPHAGOGASTRODUODENOSCOPY (EGD);  Surgeon: Rogene Houston, MD;  [facility];  Service: Endoscopy;  Laterality: N/A;  . EYE SURGERY    . IR GENERIC HISTORICAL  07/04/2016   IR RADIOLOGIST EVAL & MGMT 07/04/2016 Jacqulynn Cadet, MD [facility] INTERV RAD  . IR RADIOLOGIST EVAL & MGMT  07/24/2017  . KIDNEY SURGERY    . KNEE ARTHROSCOPY Left   . LIVER TRANSPLANT    . POLYPECTOMY  08/07/2018   Procedure: POLYPECTOMY;  Surgeon: Rogene Houston, MD;  [facility];  Service: Endoscopy;;  colon   . RADIOLOGY WITH ANESTHESIA N/A 07/16/2015   Procedure: TIPS;  Surgeon: Medication Radiologist, MD;  Location: [place];  Service: Radiology;  Laterality: N/A;  . ROTATOR CUFF REPAIR Left   . TOTAL KNEE ARTHROPLASTY Right    right. 2002     Review of systems: She denies shortness of breath.  She denies chest pain.  She does have some occasional pain in the right hand and occasional numbness and tingling but that is different from the left hand.  She thinks this is related to overuse of the right hand compensating for the left.  Data: Patient had a duplex ultrasound today which showed evidence of steal in the left hand.  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  The patient currently does not need the fistula has a functioning transplant.  Plan: Ligation left arm AV fistula.  The patient had jury  duty coming up soon.  She will call to schedule this at her convenience.  Ruta Hinds, MD [facility] [place] Office: [phone] Pager: [phone]

## 2018-12-17 ENCOUNTER — Telehealth: Payer: Self-pay

## 2018-12-17 NOTE — Telephone Encounter (Signed)
Attempted to reach pt to schedule surgery. Pt is at jury duty. Will try to call her again.

## 2018-12-19 ENCOUNTER — Telehealth: Payer: Self-pay

## 2018-12-19 ENCOUNTER — Other Ambulatory Visit: Payer: Self-pay

## 2018-12-19 NOTE — Telephone Encounter (Signed)
Attempted again to reach pt. On her home line. The person who answered said she will be back later and she does not have a cell to reach her on.

## 2018-12-23 ENCOUNTER — Telehealth: Payer: Self-pay | Admitting: Orthopedic Surgery

## 2018-12-23 NOTE — Telephone Encounter (Signed)
Patient called about the recent referral to [facility]; relays that she did not schedule with their office initially, as she was going to follow up with a specialist in [facility], and was told that "their orthopaedic doctor is retiring." In any event, she would like to again try to schedule with [facility].  Please advise.

## 2018-12-24 NOTE — Unmapped (Signed)
Confirmed with patient Ortho is delayed with scheduling due to surgeons moving practices.  Confirmed with her [facility] orders renewed.

## 2018-12-24 NOTE — Telephone Encounter (Signed)
She can call them I called her with the number [phone] ext [phone]. They have her referral, and she can just call to schedule. I left message for her to call back and get the number

## 2018-12-25 NOTE — Telephone Encounter (Signed)
Patient returned call; I relayed the information as noted; states she will call back to [facility] at this phone number.

## 2018-12-26 ENCOUNTER — Other Ambulatory Visit: Payer: Self-pay | Admitting: Radiology

## 2018-12-26 DIAGNOSIS — G8929 Other chronic pain: Secondary | ICD-10-CM

## 2018-12-26 DIAGNOSIS — M25562 Pain in left knee: Principal | ICD-10-CM

## 2018-12-26 NOTE — Unmapped (Signed)
[facility] Refill Coordination Note    Specialty Medication(s) to be Shipped:   Transplant: Myfortic 180mg   Other medication(s) to be shipped: Atorvastatin 10mg  & Pantoprazole 40mg    **Envarsus from Mfg til 01/2019**     Michelle Travis, DOB: 03/01/1956  Phone: [phone] (home) [phone] (work)    All above HIPAA information was verified with patient.     Completed refill call assessment today to schedule patient's medication shipment from the [facility] [phone]).       Specialty medication(s) and dose(s) confirmed: Regimen is correct and unchanged.   Changes to medications: [name] reports no changes reported at this time.  Changes to insurance: No  Questions for the pharmacist: No    The patient will receive a drug information handout for each medication shipped and additional FDA Medication Guides as required.      DISEASE/MEDICATION-SPECIFIC INFORMATION        N/A    ADHERENCE     Medication Adherence    Patient reported X missed doses in the last month:  0  Specialty Medication:  Envarsus & Myfortic 1800mg   Patient is on additional specialty medications:  No  Patient is on more than two specialty medications:  No  Any gaps in refill history greater than 2 weeks in the last 3 months:  no  Demonstrates understanding of importance of adherence:  yes  Informant:  patient  Reliability of informant:  reliable  Adherence tools used:  patient uses a pill box to manage medications  Confirmed plan for next specialty medication refill:  delivery by pharmacy          Refill Coordination    Has the Patients' Contact Information Changed:  No  Is the Shipping Address Different:  No         MEDICARE PART B DOCUMENTATION     Myfortic 180mg : Patient has 9 days worth of tablets on hand.    SHIPPING     Shipping address confirmed in Epic.     Delivery Scheduled: Yes, Expected medication delivery date: 01/03/2019 via UPS or courier.     Medication will be delivered via UPS to the home address in Epic [place].    Michelle Travis   [facility] Specialty Technician

## 2018-12-26 NOTE — Unmapped (Addendum)
Patient received the application paperwork from [employer] on her Envarsus manf. Assistance renewal. Spoke with [facility] department and they confirmed they will submit the entire application on patient behalf.  Confirmed this with patient.

## 2018-12-26 NOTE — Telephone Encounter (Signed)
Patient has made appointment at [facility] for her pain management, she states Som[facility]illo has told her it is okay for her to use the Tylenol #3, she wants a refill, until she can go for the appointment for pain management (march 13th)

## 2018-12-27 MED ORDER — MYFORTIC 180 MG TABLET,DELAYED RELEASE
ORAL_TABLET | 11 refills | 0 days | Status: CP
Start: 2018-12-27 — End: 2019-12-27
  Filled 2019-01-02: qty 120, 30d supply, fill #0

## 2018-12-27 MED ORDER — ENVARSUS XR 1 MG TABLET,EXTENDED RELEASE
ORAL_TABLET | Freq: Every day | ORAL | 3 refills | 0 days | Status: CP
Start: 2018-12-27 — End: 2019-06-09

## 2018-12-30 DIAGNOSIS — Z944 Liver transplant status: Principal | ICD-10-CM

## 2019-01-01 DIAGNOSIS — Z79899 Other long term (current) drug therapy: Secondary | ICD-10-CM | POA: Diagnosis not present

## 2019-01-01 DIAGNOSIS — Z944 Liver transplant status: Secondary | ICD-10-CM | POA: Diagnosis not present

## 2019-01-02 LAB — CBC W/ DIFFERENTIAL
BANDED NEUTROPHILS ABSOLUTE COUNT: 0 10*3/uL (ref 0.0–0.1)
BASOPHILS ABSOLUTE COUNT: 0.1 10*3/uL (ref 0.0–0.2)
BASOPHILS RELATIVE PERCENT: 1 %
EOSINOPHILS RELATIVE PERCENT: 4 %
HEMATOCRIT: 37.8 % (ref 34.0–46.6)
HEMOGLOBIN: 11.6 g/dL (ref 11.1–15.9)
IMMATURE GRANULOCYTES: 0 %
LYMPHOCYTES ABSOLUTE COUNT: 1.4 10*3/uL (ref 0.7–3.1)
LYMPHOCYTES RELATIVE PERCENT: 20 %
MEAN CORPUSCULAR HEMOGLOBIN CONC: 30.7 g/dL — ABNORMAL LOW (ref 31.5–35.7)
MEAN CORPUSCULAR HEMOGLOBIN: 25.1 pg — ABNORMAL LOW (ref 26.6–33.0)
MEAN CORPUSCULAR VOLUME: 82 fL (ref 79–97)
MEAN CORPUSCULAR VOLUME: 82 fL — ABNORMAL LOW (ref 79–97)
MONOCYTES ABSOLUTE COUNT: 0.6 10*3/uL (ref 0.1–0.9)
MONOCYTES RELATIVE PERCENT: 9 %
NEUTROPHILS ABSOLUTE COUNT: 4.4 10*3/uL (ref 1.4–7.0)
PLATELET COUNT: 395 10*3/uL (ref 150–450)
RED BLOOD CELL COUNT: 4.63 x10E6/uL (ref 3.77–5.28)
RED CELL DISTRIBUTION WIDTH: 15.1 % (ref 11.7–15.4)
WHITE BLOOD CELL COUNT: 6.7 10*3/uL (ref 3.4–10.8)

## 2019-01-02 LAB — COMPREHENSIVE METABOLIC PANEL
A/G RATIO: 2.1 (ref 1.2–2.2)
ALBUMIN: 4.4 g/dL (ref 3.8–4.8)
ALT (SGPT): 25 IU/L (ref 0–32)
AST (SGOT): 23 IU/L (ref 0–40)
BLOOD UREA NITROGEN: 12 mg/dL (ref 8–27)
BUN / CREAT RATIO: 18 (ref 12–28)
CALCIUM: 9.3 mg/dL (ref 8.7–10.3)
CHLORIDE: 105 mmol/L (ref 96–106)
CO2: 22 mmol/L (ref 20–29)
CREATININE: 0.66 mg/dL (ref 0.57–1.00)
GLOBULIN, TOTAL: 2.1 g/dL (ref 1.5–4.5)
GLUCOSE: 98 mg/dL (ref 65–99)
POTASSIUM: 4.4 mmol/L (ref 3.5–5.2)
SODIUM: 141 mmol/L (ref 134–144)
TOTAL PROTEIN: 6.5 g/dL (ref 6.0–8.5)

## 2019-01-02 LAB — MAGNESIUM: MAGNESIUM: 1.8 mg/dL (ref 1.6–2.3)

## 2019-01-02 MED FILL — MYFORTIC 180 MG TABLET,DELAYED RELEASE: 30 days supply | Qty: 120 | Fill #0 | Status: AC

## 2019-01-02 MED FILL — ATORVASTATIN 10 MG TABLET: 30 days supply | Qty: 30 | Fill #5 | Status: AC

## 2019-01-02 MED FILL — PANTOPRAZOLE 40 MG TABLET,DELAYED RELEASE: ORAL | 30 days supply | Qty: 30 | Fill #6

## 2019-01-02 MED FILL — ATORVASTATIN 10 MG TABLET: ORAL | 30 days supply | Qty: 30 | Fill #5

## 2019-01-02 MED FILL — PANTOPRAZOLE 40 MG TABLET,DELAYED RELEASE: 30 days supply | Qty: 30 | Fill #6 | Status: AC

## 2019-01-03 ENCOUNTER — Other Ambulatory Visit: Payer: Self-pay

## 2019-01-03 ENCOUNTER — Encounter (HOSPITAL_COMMUNITY): Payer: Self-pay | Admitting: *Deleted

## 2019-01-03 NOTE — Progress Notes (Signed)
Pt denies SOB, chest pain, and being under the care of a cardiologist. Pt denies having a cardiac cath. Pt denies having an EKG within the last year. Pt made aware to stop taking vitamins, fish oil and herbal medications. Do not take any NSAIDs ie: Ibuprofen, Advil, Naproxen (Aleve), Motrin, BC and Goody Powder. Pt stated that she will take her morning medications post-op. Pt verbalized understanding of all pre-op instructions.

## 2019-01-05 NOTE — Anesthesia Preprocedure Evaluation (Addendum)
Anesthesia Evaluation  Patient identified by MRN, date of birth, ID band Patient awake    Reviewed: Allergy & Precautions, NPO status , Patient's Chart, lab work & pertinent test results  Airway Mallampati: III  TM Distance: >3 FB Neck ROM: Full    Dental   Pulmonary sleep apnea ,    breath sounds clear to auscultation       Cardiovascular hypertension,  Rhythm:Regular Rate:Normal     Neuro/Psych negative neurological ROS     GI/Hepatic negative GI ROS, (+) Cirrhosis       ,   Endo/Other  negative endocrine ROS  Renal/GU CRFRenal disease     Musculoskeletal  (+) Arthritis ,   Abdominal   Peds  Hematology negative hematology ROS (+)   Anesthesia Other Findings   Reproductive/Obstetrics                            Lab Results  Component Value Date   WBC 2.7 (L) 10/08/2017   HGB 7.7 (L) 10/08/2017   HCT 24.4 (L) 10/08/2017   MCV 91.7 10/08/2017   PLT 437 (H) 10/08/2017   Lab Results  Component Value Date   CREATININE 2.90 (H) 10/08/2017   BUN 21 (H) 10/08/2017   NA 134 (L) 10/08/2017   K 4.5 10/08/2017   CL 101 10/08/2017   CO2 23 10/08/2017    Anesthesia Physical Anesthesia Plan  ASA: III  Anesthesia Plan: MAC   Post-op Pain Management:    Induction: Intravenous  PONV Risk Score and Plan: 2 and Propofol infusion, Ondansetron and Treatment may vary due to age or medical condition  Airway Management Planned: Natural Airway and Simple Face Mask  Additional Equipment:   Intra-op Plan:   Post-operative Plan:   Informed Consent: I have reviewed the patients History and Physical, chart, labs and discussed the procedure including the risks, benefits and alternatives for the proposed anesthesia with the patient or authorized representative who has indicated his/her understanding and acceptance.       Plan Discussed with: CRNA  Anesthesia Plan Comments:         Anesthesia Quick Evaluation

## 2019-01-06 ENCOUNTER — Ambulatory Visit (HOSPITAL_COMMUNITY): Payer: Medicare HMO | Admitting: Anesthesiology

## 2019-01-06 ENCOUNTER — Encounter (HOSPITAL_COMMUNITY)
Admission: RE | Disposition: A | Discharge status: Home / Self Care | Payer: Self-pay | Source: Home / Self Care | Attending: Vascular Surgery

## 2019-01-06 ENCOUNTER — Encounter (HOSPITAL_COMMUNITY): Payer: Self-pay

## 2019-01-06 ENCOUNTER — Other Ambulatory Visit: Payer: Self-pay

## 2019-01-06 ENCOUNTER — Ambulatory Visit (HOSPITAL_COMMUNITY)
Admission: RE | Admit: 2019-01-06 | Discharge: 2019-01-06 | Disposition: A | Discharge status: Home / Self Care | Payer: Medicare HMO | Attending: Vascular Surgery | Admitting: Vascular Surgery

## 2019-01-06 DIAGNOSIS — Z944 Liver transplant status: Secondary | ICD-10-CM | POA: Insufficient documentation

## 2019-01-06 DIAGNOSIS — Z940 Kidney transplant status: Secondary | ICD-10-CM | POA: Insufficient documentation

## 2019-01-06 DIAGNOSIS — Z9842 Cataract extraction status, left eye: Secondary | ICD-10-CM | POA: Diagnosis not present

## 2019-01-06 DIAGNOSIS — Z9841 Cataract extraction status, right eye: Secondary | ICD-10-CM | POA: Insufficient documentation

## 2019-01-06 DIAGNOSIS — Z853 Personal history of malignant neoplasm of breast: Secondary | ICD-10-CM | POA: Insufficient documentation

## 2019-01-06 DIAGNOSIS — I10 Essential (primary) hypertension: Secondary | ICD-10-CM | POA: Diagnosis not present

## 2019-01-06 DIAGNOSIS — M5136 Other intervertebral disc degeneration, lumbar region: Secondary | ICD-10-CM | POA: Diagnosis not present

## 2019-01-06 DIAGNOSIS — Z86010 Personal history of colonic polyps: Secondary | ICD-10-CM | POA: Diagnosis not present

## 2019-01-06 DIAGNOSIS — G4730 Sleep apnea, unspecified: Secondary | ICD-10-CM | POA: Diagnosis not present

## 2019-01-06 DIAGNOSIS — M1990 Unspecified osteoarthritis, unspecified site: Secondary | ICD-10-CM | POA: Insufficient documentation

## 2019-01-06 DIAGNOSIS — T82898A Other specified complication of vascular prosthetic devices, implants and grafts, initial encounter: Secondary | ICD-10-CM | POA: Insufficient documentation

## 2019-01-06 HISTORY — PX: REVISON OF ARTERIOVENOUS FISTULA: SHX6074

## 2019-01-06 HISTORY — DX: Presence of spectacles and contact lenses: Z97.3

## 2019-01-06 HISTORY — DX: Other specified complication of vascular prosthetic devices, implants and grafts, initial encounter: T82.898A

## 2019-01-06 LAB — POCT I-STAT 4, (NA,K, GLUC, HGB,HCT)
Glucose, Bld: 102 mg/dL — ABNORMAL HIGH (ref 70–99)
HEMATOCRIT: 37 % (ref 36.0–46.0)
Hemoglobin: 12.6 g/dL (ref 12.0–15.0)
Potassium: 3.9 mmol/L (ref 3.5–5.1)
Sodium: 140 mmol/L (ref 135–145)

## 2019-01-06 SURGERY — REVISON OF ARTERIOVENOUS FISTULA
Anesthesia: Monitor Anesthesia Care | Site: Arm Upper | Laterality: Left

## 2019-01-06 MED ORDER — DEXAMETHASONE SODIUM PHOSPHATE 10 MG/ML IJ SOLN
INTRAMUSCULAR | Status: AC
  Filled 2019-01-06: qty 1

## 2019-01-06 MED ORDER — GLYCOPYRROLATE PF 0.2 MG/ML IJ SOSY
PREFILLED_SYRINGE | INTRAMUSCULAR | Status: AC
  Filled 2019-01-06: qty 3

## 2019-01-06 MED ORDER — SODIUM CHLORIDE 0.9 % IV SOLN
INTRAVENOUS | Status: DC
  Administered 2019-01-06: 07:00:00 via INTRAVENOUS

## 2019-01-06 MED ORDER — ROCURONIUM BROMIDE 50 MG/5ML IV SOSY
PREFILLED_SYRINGE | INTRAVENOUS | Status: AC
  Filled 2019-01-06: qty 5

## 2019-01-06 MED ORDER — 0.9 % SODIUM CHLORIDE (POUR BTL) OPTIME
TOPICAL | Status: DC | PRN
  Administered 2019-01-06: 1000 mL

## 2019-01-06 MED ORDER — LIDOCAINE HCL (PF) 1 % IJ SOLN
INTRAMUSCULAR | Status: AC
  Filled 2019-01-06: qty 30

## 2019-01-06 MED ORDER — LIDOCAINE HCL (PF) 1 % IJ SOLN
INTRAMUSCULAR | Status: DC | PRN
  Administered 2019-01-06: 9 mL

## 2019-01-06 MED ORDER — OXYCODONE HCL 5 MG PO TABS: 5.0000 mg | ORAL_TABLET | Freq: Four times a day (QID) | ORAL | 0 refills | Status: DC | PRN

## 2019-01-06 MED ORDER — CEFAZOLIN SODIUM-DEXTROSE 2-4 GM/100ML-% IV SOLN
2.0000 g | INTRAVENOUS | Status: AC
  Administered 2019-01-06: 2 g via INTRAVENOUS
  Filled 2019-01-06: qty 100

## 2019-01-06 MED ORDER — HEPARIN SODIUM (PORCINE) 1000 UNIT/ML IJ SOLN
INTRAMUSCULAR | Status: AC
  Filled 2019-01-06: qty 1

## 2019-01-06 MED ORDER — ONDANSETRON HCL 4 MG/2ML IJ SOLN
INTRAMUSCULAR | Status: AC
  Filled 2019-01-06: qty 2

## 2019-01-06 MED ORDER — PROPOFOL 500 MG/50ML IV EMUL
INTRAVENOUS | Status: DC | PRN
  Administered 2019-01-06: 75 ug/kg/min via INTRAVENOUS

## 2019-01-06 MED ORDER — CHLORHEXIDINE GLUCONATE CLOTH 2 % EX PADS: 6.0000 | MEDICATED_PAD | Freq: Once | CUTANEOUS | Status: DC

## 2019-01-06 MED ORDER — FENTANYL CITRATE (PF) 250 MCG/5ML IJ SOLN
INTRAMUSCULAR | Status: AC
  Filled 2019-01-06: qty 5

## 2019-01-06 MED ORDER — PROPOFOL 10 MG/ML IV BOLUS
INTRAVENOUS | Status: AC
  Filled 2019-01-06: qty 20

## 2019-01-06 MED ORDER — PROPOFOL 10 MG/ML IV BOLUS
INTRAVENOUS | Status: DC | PRN
  Administered 2019-01-06 (×3): 10 mg via INTRAVENOUS
  Administered 2019-01-06: 20 mg via INTRAVENOUS

## 2019-01-06 MED ORDER — MIDAZOLAM HCL 2 MG/2ML IJ SOLN
INTRAMUSCULAR | Status: AC
  Filled 2019-01-06: qty 2

## 2019-01-06 MED ORDER — ROCURONIUM BROMIDE 50 MG/5ML IV SOSY
PREFILLED_SYRINGE | INTRAVENOUS | Status: AC
  Filled 2019-01-06: qty 10

## 2019-01-06 MED ORDER — MIDAZOLAM HCL 5 MG/5ML IJ SOLN
INTRAMUSCULAR | Status: DC | PRN
  Administered 2019-01-06: 2 mg via INTRAVENOUS

## 2019-01-06 MED ORDER — LIDOCAINE 2% (20 MG/ML) 5 ML SYRINGE
INTRAMUSCULAR | Status: AC
  Filled 2019-01-06: qty 5

## 2019-01-06 MED ORDER — FENTANYL CITRATE (PF) 100 MCG/2ML IJ SOLN
25.0000 ug | INTRAMUSCULAR | Status: DC | PRN
  Administered 2019-01-06: 25 ug via INTRAVENOUS

## 2019-01-06 MED ORDER — SODIUM CHLORIDE 0.9 % IV SOLN
INTRAVENOUS | Status: AC
  Filled 2019-01-06: qty 1.2

## 2019-01-06 MED ORDER — ONDANSETRON HCL 4 MG/2ML IJ SOLN
INTRAMUSCULAR | Status: DC | PRN
  Administered 2019-01-06: 4 mg via INTRAVENOUS

## 2019-01-06 SURGICAL SUPPLY — 33 items
ADH SKN CLS APL DERMABOND .7 (GAUZE/BANDAGES/DRESSINGS) ×1
AGENT HMST SPONGE THK3/8 (HEMOSTASIS)
ARMBAND PINK RESTRICT EXTREMIT (MISCELLANEOUS) ×2 IMPLANT
CANISTER SUCT 3000ML PPV (MISCELLANEOUS) ×2 IMPLANT
CANNULA VESSEL 3MM 2 BLNT TIP (CANNULA) ×2 IMPLANT
CLIP VESOCCLUDE MED 6/CT (CLIP) ×2 IMPLANT
CLIP VESOCCLUDE SM WIDE 6/CT (CLIP) ×2 IMPLANT
COVER PROBE W GEL 5X96 (DRAPES) IMPLANT
COVER WAND RF STERILE (DRAPES) ×2 IMPLANT
DECANTER SPIKE VIAL GLASS SM (MISCELLANEOUS) ×2 IMPLANT
DERMABOND ADVANCED (GAUZE/BANDAGES/DRESSINGS) ×1
DERMABOND ADVANCED .7 DNX12 (GAUZE/BANDAGES/DRESSINGS) ×1 IMPLANT
DRAIN PENROSE 1/4X12 LTX STRL (WOUND CARE) ×2 IMPLANT
ELECT REM PT RETURN 9FT ADLT (ELECTROSURGICAL) ×2
ELECTRODE REM PT RTRN 9FT ADLT (ELECTROSURGICAL) ×1 IMPLANT
GLOVE BIO SURGEON STRL SZ7.5 (GLOVE) ×2 IMPLANT
GOWN STRL REUS W/ TWL LRG LVL3 (GOWN DISPOSABLE) ×3 IMPLANT
GOWN STRL REUS W/TWL LRG LVL3 (GOWN DISPOSABLE) ×6
HEMOSTAT SPONGE AVITENE ULTRA (HEMOSTASIS) IMPLANT
KIT BASIN OR (CUSTOM PROCEDURE TRAY) ×2 IMPLANT
KIT TURNOVER KIT B (KITS) ×2 IMPLANT
LOOP VESSEL MINI RED (MISCELLANEOUS) IMPLANT
NS IRRIG 1000ML POUR BTL (IV SOLUTION) ×2 IMPLANT
PACK CV ACCESS (CUSTOM PROCEDURE TRAY) ×2 IMPLANT
PAD ARMBOARD 7.5X6 YLW CONV (MISCELLANEOUS) ×4 IMPLANT
SUT PROLENE 6 0 CC (SUTURE) ×2 IMPLANT
SUT PROLENE 7 0 BV 1 (SUTURE) IMPLANT
SUT VIC AB 3-0 SH 27 (SUTURE) ×2
SUT VIC AB 3-0 SH 27X BRD (SUTURE) ×1 IMPLANT
SUT VICRYL 4-0 PS2 18IN ABS (SUTURE) ×2 IMPLANT
TOWEL GREEN STERILE (TOWEL DISPOSABLE) ×2 IMPLANT
UNDERPAD 30X30 (UNDERPADS AND DIAPERS) ×2 IMPLANT
WATER STERILE IRR 1000ML POUR (IV SOLUTION) ×2 IMPLANT

## 2019-01-06 NOTE — Unmapped (Signed)
Per NP Martin-Velez and PharmD Mincemoyer new tac goal 3-5.

## 2019-01-06 NOTE — Anesthesia Procedure Notes (Signed)
Procedure Name: MAC Performed by: Valda Favia, CRNA Pre-anesthesia Checklist: Patient identified, Emergency Drugs available, Suction available, Patient being monitored and Timeout performed Patient Re-evaluated:Patient Re-evaluated prior to induction Oxygen Delivery Method: Simple face mask Preoxygenation: Pre-oxygenation with 100% oxygen Induction Type: IV induction Airway Equipment and Method: Oral airway Placement Confirmation: positive ETCO2

## 2019-01-06 NOTE — Transfer of Care (Signed)
Immediate Anesthesia Transfer of Care Note  Patient: Savannah Jackson  Procedure(s) Performed: LEFT ARM LIGATION OF ARTERIOVENOUS FISTULA (Left Arm Upper)  Patient Location: PACU  Anesthesia Type:MAC  Level of Consciousness: awake, alert  and oriented  Airway & Oxygen Therapy: Patient Spontanous Breathing and Patient connected to face mask oxygen  Post-op Assessment: Report given to RN and Post -op Vital signs reviewed and stable  Post vital signs: Reviewed and stable  Last Vitals:  Vitals Value Taken Time  BP 140/73 01/06/2019  8:27 AM  Temp 36.2 C 01/06/2019  8:27 AM  Pulse 58 01/06/2019  8:28 AM  Resp 18 01/06/2019  8:28 AM  SpO2 100 % 01/06/2019  8:28 AM  Vitals shown include unvalidated device data.  Last Pain:  Vitals:   01/06/19 0705  TempSrc:   PainSc: 0-No pain      Patients Stated Pain Goal: 4 (16/60/63 0160)  Complications: No apparent anesthesia complications

## 2019-01-06 NOTE — Interval H&P Note (Signed)
History and Physical Interval Note:  01/06/2019 7:25 AM  Savannah Jackson  has presented today for surgery, with the diagnosis of ARTERIOVENOUS STEAL SYNDROME.  The various methods of treatment have been discussed with the patient and family. After consideration of risks, benefits and other options for treatment, the patient has consented to  Procedure(s): LEFT ARM LIGATION OF ARTERIOVENOUS FISTULA (Left) as a surgical intervention.  The patient's history has been reviewed, patient examined, no change in status, stable for surgery.  I have reviewed the patient's chart and labs.  Questions were answered to the patient's satisfaction.     Ruta Hinds

## 2019-01-06 NOTE — Op Note (Signed)
Procedure: Ligation left arm AV fistula  Preoperative diagnosis: Ischemic steal left hand  Postoperative diagnosis: Same  Anesthesia: Local with IV sedation  Assistant: Graylon Good, RNFA  Operative findings: #1 left arm AV fistula with doubly ligated with 2-0 silk ties palpable radial pulse at conclusion of case  Operative details: After obtaining informed consent, the patient was taken the operating.  The patient was placed in supine position operating table.  After adequate sedation patient's entire left upper extremities prepped and draped in usual sterile fashion.  Local anesthesia was infiltrated over the left antecubital area after identifying the area of the anastomosis with ultrasound.  Longitudinal incision was made through pre-existing scar was carried down through the subcutaneous tissues to the level of fistula.  The fistula was patent with a palpable thrill within it.  I dissected the fistula free circumferentially just above the level of the anastomosis.  It was then ligated in 2 separate locations with a 2-0 silk tie.  There was no further flow within the fistula.  The patient had a palpable radial pulse.  Subcutaneous tissues of the incision were reapproximated using running 3-0 Vicryl suture.  The skin was closed with a 4-0 Vicryl subcuticular stitch.  The patient tolerated procedure well and there were no complications.  The instrument sponge and needle count was correct at the end of the case.  The patient was taken the recovery room in stable condition.  Ruta Hinds, MD [facility] [place] Office: [phone] Pager: [phone]

## 2019-01-06 NOTE — Anesthesia Postprocedure Evaluation (Signed)
Anesthesia Post Note  Patient: Savannah Jackson  Procedure(s) Performed: LEFT ARM LIGATION OF ARTERIOVENOUS FISTULA (Left Arm Upper)     Patient location during evaluation: PACU Anesthesia Type: MAC Level of consciousness: awake and alert Pain management: pain level controlled Vital Signs Assessment: post-procedure vital signs reviewed and stable Respiratory status: spontaneous breathing, nonlabored ventilation, respiratory function stable and patient connected to nasal cannula oxygen Cardiovascular status: stable and blood pressure returned to baseline Postop Assessment: no apparent nausea or vomiting Anesthetic complications: no    Last Vitals:  Vitals:   01/06/19 0827 01/06/19 0842  BP: 140/73 134/86  Pulse: (!) 58 62  Resp: 19 16  Temp: (!) 36.2 C (!) 36.3 C  SpO2: 100% 99%    Last Pain:  Vitals:   01/06/19 0842  TempSrc:   PainSc: 0-No pain                 Tiajuana Amass

## 2019-01-07 ENCOUNTER — Encounter (HOSPITAL_COMMUNITY): Payer: Self-pay | Admitting: Vascular Surgery

## 2019-01-14 DIAGNOSIS — E559 Vitamin D deficiency, unspecified: Secondary | ICD-10-CM | POA: Diagnosis not present

## 2019-01-14 DIAGNOSIS — G8929 Other chronic pain: Secondary | ICD-10-CM | POA: Diagnosis not present

## 2019-01-14 DIAGNOSIS — M25562 Pain in left knee: Secondary | ICD-10-CM | POA: Diagnosis not present

## 2019-01-14 DIAGNOSIS — M25561 Pain in right knee: Secondary | ICD-10-CM | POA: Diagnosis not present

## 2019-01-14 DIAGNOSIS — M129 Arthropathy, unspecified: Secondary | ICD-10-CM | POA: Diagnosis not present

## 2019-01-14 DIAGNOSIS — Z79899 Other long term (current) drug therapy: Secondary | ICD-10-CM | POA: Diagnosis not present

## 2019-01-16 DIAGNOSIS — M25562 Pain in left knee: Secondary | ICD-10-CM | POA: Diagnosis not present

## 2019-01-16 DIAGNOSIS — M25561 Pain in right knee: Secondary | ICD-10-CM | POA: Diagnosis not present

## 2019-01-20 DIAGNOSIS — Z944 Liver transplant status: Principal | ICD-10-CM

## 2019-01-20 DIAGNOSIS — Z79899 Other long term (current) drug therapy: Principal | ICD-10-CM

## 2019-01-28 DIAGNOSIS — Z79899 Other long term (current) drug therapy: Secondary | ICD-10-CM | POA: Diagnosis not present

## 2019-01-28 DIAGNOSIS — M25561 Pain in right knee: Secondary | ICD-10-CM | POA: Diagnosis not present

## 2019-01-28 DIAGNOSIS — M25562 Pain in left knee: Secondary | ICD-10-CM | POA: Diagnosis not present

## 2019-01-28 DIAGNOSIS — G8929 Other chronic pain: Secondary | ICD-10-CM | POA: Diagnosis not present

## 2019-01-28 NOTE — Unmapped (Signed)
Patient called to report her ortho doctor is managing her arthritic knee pain with oxycodone and Belbuca (buprenorphine). Wanted to make sure this was safe to take for her liver and transplant medications, confirmed with Cleone Slim, PharmD that there is no interactions and the drug is safe to continue. Encouraged her to separate her morning medications from the use of the buccal film as to not decrease efficacy of the film if she drinks after.     She was grateful for my help and reports good relief with this pain management modality as she doesn't wish to pursue any surgical intervention given the uncertainty of covid-19 and her immunocompromised status.        Michelle Fey, DNP, APRN, FNP-C  [facility] for [facility]  [address]  [place], [place]  [address]

## 2019-01-28 NOTE — Unmapped (Signed)
[facility] Shared [facility] Specialty Pharmacy Clinical Assessment & Refill Coordination Note    Michelle Travis, DOB: Feb 07, 1956  Phone: [phone] (home) [phone] (work)    All above HIPAA information was verified with patient.     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 oxyCODONE (OXY-IR) 5 mg capsule Take 5 mg by mouth every six (6) hours as needed for pain.     ??? aspirin (ECOTRIN) 81 MG tablet Take 1 tablet (81 mg total) by mouth daily. 30 tablet 11   ??? atorvastatin (LIPITOR) 10 MG tablet Take 1 tablet (10 mg total) by mouth daily. 30 tablet 8   ??? blood sugar diagnostic Strp by Other route daily. 35 each 3   ??? blood-glucose meter Misc Check Blood Sugar daily in the afternoons and as needed while taking liraglutide 1 each 0   ??? cholecalciferol, vitamin D3, (CHOLECALCIFEROL) 1,000 unit tablet Take 2 tablets (2,000 Units total) by mouth daily. 60 tablet 11   ??? empty container Misc USE AS DIRECTED 1 each 2   ??? ENVARSUS XR 1 mg Tb24 extended release tablet Take 2 tablet (2 mg) by mouth daily. 270 tablet 3   ??? lancets Misc Use when checking blood sugar. 100 each 0   ??? MYFORTIC 180 mg EC tablet TAKE 2 TABLETS (360 MG) BY MOUTH TWICE DAILY 120 tablet 11   ??? pantoprazole (PROTONIX) 40 MG tablet Take 1 tablet (40 mg total) by mouth daily. 30 tablet 0   ??? pantoprazole (PROTONIX) 40 MG tablet TAKE 1 TABLET BY MOUTH ONCE DAILY 30 tablet PRN   ??? pen needle, diabetic 32 gauge x 1/4 Ndle 1 each by Miscellaneous route daily. (Patient not taking: Reported on 01/28/2019) 90 each 3     No current facility-administered medications for this visit.         Changes to medications: started oxycodone 5mg  - will message clinic    Allergies   Allergen Reactions   ??? Latex Swelling and Rash     Discoloration of skin.   Discoloration of skin.    ??? Hydrocodone Itching   ??? Hydrocodone-Acetaminophen Itching       Changes to allergies: No    SPECIALTY MEDICATION ADHERENCE     Myfortic 180mg   : 7 days of medicine on hand        Medication Adherence    Patient reported X missed doses in the last month:  0  Specialty Medication:  myfortic 180mg   Adherence tools used:  patient uses a pill box to manage medications          Specialty medication(s) dose(s) confirmed: Regimen is correct and unchanged.     Are there any concerns with adherence? No    Adherence counseling provided? Not needed    CLINICAL MANAGEMENT AND INTERVENTION      Clinical Benefit Assessment:    Do you feel the medicine is effective or helping your condition? Yes    Clinical Benefit counseling provided? Not needed    Adverse Effects Assessment:    Are you experiencing any side effects? No    Are you experiencing difficulty administering your medicine? No    Quality of Life Assessment:    How many days over the past month did your transplant  keep you from your normal activities? For example, brushing your teeth or getting  up in the morning. 0    Have you discussed this with your provider? Not needed    Therapy Appropriateness:    Is therapy appropriate? Yes, therapy is appropriate and should be continued    DISEASE/MEDICATION-SPECIFIC INFORMATION      N/A    PATIENT SPECIFIC NEEDS     ? Does the patient have any physical, cognitive, or cultural barriers? No    ? Is the patient high risk? Yes, patient taking a REMS drug     ? Does the patient require a Care Management Plan? No     ? Does the patient require physician intervention or other additional services (i.e. nutrition, smoking cessation, social work)? 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     Medication will be delivered via UPS to the confirmed home address in [facility].    The patient will receive a drug information handout for each medication shipped and additional FDA Medication Guides as required.  Verified that patient has previously received a Conservation officer, historic buildings.    [name]   [facility] Specialty Pharmacist

## 2019-01-30 MED FILL — ATORVASTATIN 10 MG TABLET: 30 days supply | Qty: 30 | Fill #6 | Status: AC

## 2019-01-30 MED FILL — PANTOPRAZOLE 40 MG TABLET,DELAYED RELEASE: ORAL | 30 days supply | Qty: 30 | Fill #7

## 2019-01-30 MED FILL — ATORVASTATIN 10 MG TABLET: ORAL | 30 days supply | Qty: 30 | Fill #6

## 2019-01-30 MED FILL — MYFORTIC 180 MG TABLET,DELAYED RELEASE: 30 days supply | Qty: 120 | Fill #1

## 2019-01-30 MED FILL — PANTOPRAZOLE 40 MG TABLET,DELAYED RELEASE: 30 days supply | Qty: 30 | Fill #7 | Status: AC

## 2019-01-30 MED FILL — MYFORTIC 180 MG TABLET,DELAYED RELEASE: 30 days supply | Qty: 120 | Fill #1 | Status: AC

## 2019-02-04 DIAGNOSIS — I10 Essential (primary) hypertension: Secondary | ICD-10-CM | POA: Diagnosis not present

## 2019-02-04 DIAGNOSIS — Z940 Kidney transplant status: Secondary | ICD-10-CM | POA: Diagnosis not present

## 2019-02-04 DIAGNOSIS — Z944 Liver transplant status: Secondary | ICD-10-CM | POA: Diagnosis not present

## 2019-02-12 DIAGNOSIS — M25562 Pain in left knee: Secondary | ICD-10-CM | POA: Diagnosis not present

## 2019-02-12 DIAGNOSIS — G8929 Other chronic pain: Secondary | ICD-10-CM | POA: Diagnosis not present

## 2019-02-12 DIAGNOSIS — M25561 Pain in right knee: Secondary | ICD-10-CM | POA: Diagnosis not present

## 2019-02-12 DIAGNOSIS — Z9189 Other specified personal risk factors, not elsewhere classified: Secondary | ICD-10-CM | POA: Diagnosis not present

## 2019-02-12 NOTE — Unmapped (Signed)
Patient called to inquire about lab interval.   Given stability she should get labs every 6-8weeks d/t covid.    She is understanding and agreeable to this plan.        Gertie Fey, DNP, APRN, FNP-C  [facility] for [facility]  [address]  [place], [place]  [address]

## 2019-02-20 NOTE — Unmapped (Signed)
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     Completed refill call assessment today to schedule patient's medication shipment from the [facility] [phone]).       Specialty medication(s) and dose(s) confirmed: Regimen is correct and unchanged.   Changes to medications: Michelle Travis reports no changes reported at this time.  Changes to insurance: No  Questions for the pharmacist: No    Confirmed patient received Welcome Packet with first shipment. The patient will receive a drug information handout for each medication shipped and additional FDA Medication Guides as required.       DISEASE/MEDICATION-SPECIFIC INFORMATION        N/A    SPECIALTY MEDICATION ADHERENCE     Medication Adherence    Patient reported X missed doses in the last month:  0  Adherence tools used:  patient uses a pill box to manage medications              Myfortic: 10 days worth of on hand.      SHIPPING     Shipping address confirmed in Epic.     Delivery Scheduled: Yes, Expected medication delivery date: 02/27/19.     Medication will be delivered via UPS to the home address in [place].    [place] A Draycen Leichter   [facility] Shared [facility] Specialty Technician

## 2019-02-26 DIAGNOSIS — G8929 Other chronic pain: Secondary | ICD-10-CM | POA: Diagnosis not present

## 2019-02-26 DIAGNOSIS — M25561 Pain in right knee: Secondary | ICD-10-CM | POA: Diagnosis not present

## 2019-02-26 DIAGNOSIS — M25562 Pain in left knee: Secondary | ICD-10-CM | POA: Diagnosis not present

## 2019-02-26 DIAGNOSIS — C50919 Malignant neoplasm of unspecified site of unspecified female breast: Secondary | ICD-10-CM | POA: Diagnosis not present

## 2019-02-26 MED FILL — MYFORTIC 180 MG TABLET,DELAYED RELEASE: 30 days supply | Qty: 120 | Fill #2

## 2019-02-26 MED FILL — MYFORTIC 180 MG TABLET,DELAYED RELEASE: 30 days supply | Qty: 120 | Fill #2 | Status: AC

## 2019-02-26 MED FILL — ATORVASTATIN 10 MG TABLET: 30 days supply | Qty: 30 | Fill #7 | Status: AC

## 2019-02-26 MED FILL — PANTOPRAZOLE 40 MG TABLET,DELAYED RELEASE: ORAL | 30 days supply | Qty: 30 | Fill #8

## 2019-02-26 MED FILL — ATORVASTATIN 10 MG TABLET: ORAL | 30 days supply | Qty: 30 | Fill #7

## 2019-02-26 MED FILL — PANTOPRAZOLE 40 MG TABLET,DELAYED RELEASE: 30 days supply | Qty: 30 | Fill #8 | Status: AC

## 2019-03-13 ENCOUNTER — Other Ambulatory Visit (HOSPITAL_COMMUNITY): Payer: Self-pay | Admitting: Internal Medicine

## 2019-03-13 DIAGNOSIS — Z1231 Encounter for screening mammogram for malignant neoplasm of breast: Secondary | ICD-10-CM

## 2019-03-17 NOTE — Unmapped (Signed)
Confirmed with patient to repeat her labs this week on Thursday.

## 2019-03-20 ENCOUNTER — Ambulatory Visit (HOSPITAL_COMMUNITY)
Admission: RE | Admit: 2019-03-20 | Discharge: 2019-03-20 | Disposition: A | Discharge status: Home / Self Care | Payer: Medicare HMO | Source: Ambulatory Visit | Attending: Internal Medicine | Admitting: Internal Medicine

## 2019-03-20 ENCOUNTER — Encounter (HOSPITAL_COMMUNITY): Payer: Self-pay

## 2019-03-20 ENCOUNTER — Other Ambulatory Visit: Payer: Self-pay

## 2019-03-20 DIAGNOSIS — Z1231 Encounter for screening mammogram for malignant neoplasm of breast: Secondary | ICD-10-CM | POA: Diagnosis not present

## 2019-03-20 DIAGNOSIS — Z944 Liver transplant status: Secondary | ICD-10-CM | POA: Diagnosis not present

## 2019-03-20 DIAGNOSIS — Z79899 Other long term (current) drug therapy: Secondary | ICD-10-CM | POA: Diagnosis not present

## 2019-03-21 LAB — COMPREHENSIVE METABOLIC PANEL
A/G RATIO: 2 (ref 1.2–2.2)
ALBUMIN: 4.4 g/dL (ref 3.8–4.8)
ALKALINE PHOSPHATASE: 68 IU/L (ref 39–117)
ALT (SGPT): 22 IU/L (ref 0–32)
AST (SGOT): 27 IU/L (ref 0–40)
BILIRUBIN TOTAL: 0.4 mg/dL (ref 0.0–1.2)
BLOOD UREA NITROGEN: 15 mg/dL (ref 8–27)
BUN / CREAT RATIO: 21 (ref 12–28)
CALCIUM: 9.8 mg/dL (ref 8.7–10.3)
CHLORIDE: 100 mmol/L (ref 96–106)
CO2: 24 mmol/L (ref 20–29)
GLOBULIN, TOTAL: 2.2 g/dL (ref 1.5–4.5)
GLUCOSE: 101 mg/dL — ABNORMAL HIGH (ref 65–99)
POTASSIUM: 4.6 mmol/L (ref 3.5–5.2)
SODIUM: 139 mmol/L (ref 134–144)
TOTAL PROTEIN: 6.6 g/dL (ref 6.0–8.5)

## 2019-03-21 LAB — PHOSPHORUS, SERUM: Lab: 3.6

## 2019-03-21 LAB — CBC W/ DIFFERENTIAL
BANDED NEUTROPHILS ABSOLUTE COUNT: 0 10*3/uL (ref 0.0–0.1)
BASOPHILS ABSOLUTE COUNT: 0.1 10*3/uL (ref 0.0–0.2)
BASOPHILS RELATIVE PERCENT: 1 %
EOSINOPHILS ABSOLUTE COUNT: 0.3 10*3/uL (ref 0.0–0.4)
EOSINOPHILS RELATIVE PERCENT: 5 %
HEMATOCRIT: 40.4 % (ref 34.0–46.6)
HEMOGLOBIN: 12.9 g/dL (ref 11.1–15.9)
IMMATURE GRANULOCYTES: 0 %
LYMPHOCYTES RELATIVE PERCENT: 32 %
MEAN CORPUSCULAR HEMOGLOBIN: 25.5 pg — ABNORMAL LOW (ref 26.6–33.0)
MEAN CORPUSCULAR VOLUME: 80 fL (ref 79–97)
MONOCYTES ABSOLUTE COUNT: 0.6 10*3/uL (ref 0.1–0.9)
MONOCYTES RELATIVE PERCENT: 10 %
NEUTROPHILS ABSOLUTE COUNT: 3 10*3/uL (ref 1.4–7.0)
NEUTROPHILS RELATIVE PERCENT: 52 %
PLATELET COUNT: 297 10*3/uL (ref 150–450)
RED BLOOD CELL COUNT: 5.05 x10E6/uL (ref 3.77–5.28)
RED CELL DISTRIBUTION WIDTH: 15 % (ref 11.7–15.4)
WHITE BLOOD CELL COUNT: 5.7 10*3/uL (ref 3.4–10.8)

## 2019-03-21 LAB — GAMMA GLUTAMYL TRANSFERASE: Lab: 56

## 2019-03-21 LAB — BLOOD UREA NITROGEN: Lab: 15

## 2019-03-21 LAB — BILIRUBIN DIRECT: Lab: 0.15

## 2019-03-21 LAB — MONOCYTES ABSOLUTE COUNT: Lab: 0.6

## 2019-03-21 LAB — MAGNESIUM: Lab: 1.6

## 2019-03-22 LAB — TACROLIMUS BLOOD: Lab: 3.7

## 2019-03-28 DIAGNOSIS — M25562 Pain in left knee: Secondary | ICD-10-CM | POA: Diagnosis not present

## 2019-03-28 DIAGNOSIS — G8929 Other chronic pain: Secondary | ICD-10-CM | POA: Diagnosis not present

## 2019-03-28 DIAGNOSIS — M25561 Pain in right knee: Secondary | ICD-10-CM | POA: Diagnosis not present

## 2019-03-28 DIAGNOSIS — Z79899 Other long term (current) drug therapy: Secondary | ICD-10-CM | POA: Diagnosis not present

## 2019-03-31 NOTE — Unmapped (Signed)
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     Completed refill call assessment today to schedule patient's medication shipment from the [facility] [phone]).       Specialty medication(s) and dose(s) confirmed: Regimen is correct and unchanged.   Changes to medications: [name] reports starting the following medications: Belbuca 300MG  (1 Strip by mouth twice a day) & Oxycodone 10mg  ( 1 tab three times a day as needed for pain )  Changes to insurance: No  Questions for the pharmacist: No    Confirmed patient received Welcome Packet with first shipment. The patient will receive a drug information handout for each medication shipped and additional FDA Medication Guides as required.       DISEASE/MEDICATION-SPECIFIC INFORMATION        N/A    SPECIALTY MEDICATION ADHERENCE     Medication Adherence    Patient reported X missed doses in the last month:  0  Specialty Medication:  Myfortic  Patient is on additional specialty medications:  No  Adherence tools used:  patient uses a pill box to manage medications        Myfortic 180 mg: 7 days of medicine on hand     SHIPPING     Shipping address confirmed in Epic.     Delivery Scheduled: Yes, Expected medication delivery date: 04/04/2019.     Medication will be delivered via UPS to the home address in [place].    Caldwell Kronenberger P Allena Katz   [facility] Shared [facility] Specialty Technician

## 2019-04-03 MED FILL — PANTOPRAZOLE 40 MG TABLET,DELAYED RELEASE: ORAL | 30 days supply | Qty: 30 | Fill #9

## 2019-04-03 MED FILL — ATORVASTATIN 10 MG TABLET: 30 days supply | Qty: 30 | Fill #8 | Status: AC

## 2019-04-03 MED FILL — MYFORTIC 180 MG TABLET,DELAYED RELEASE: 30 days supply | Qty: 120 | Fill #3

## 2019-04-03 MED FILL — ATORVASTATIN 10 MG TABLET: ORAL | 30 days supply | Qty: 30 | Fill #8

## 2019-04-03 MED FILL — MYFORTIC 180 MG TABLET,DELAYED RELEASE: 30 days supply | Qty: 120 | Fill #3 | Status: AC

## 2019-04-03 MED FILL — PANTOPRAZOLE 40 MG TABLET,DELAYED RELEASE: 30 days supply | Qty: 30 | Fill #9 | Status: AC

## 2019-04-11 DIAGNOSIS — Z96651 Presence of right artificial knee joint: Secondary | ICD-10-CM | POA: Diagnosis not present

## 2019-04-11 DIAGNOSIS — Z944 Liver transplant status: Secondary | ICD-10-CM | POA: Diagnosis not present

## 2019-04-11 DIAGNOSIS — M1990 Unspecified osteoarthritis, unspecified site: Secondary | ICD-10-CM | POA: Diagnosis not present

## 2019-04-11 DIAGNOSIS — Z940 Kidney transplant status: Secondary | ICD-10-CM | POA: Diagnosis not present

## 2019-04-24 MED ORDER — ATORVASTATIN 10 MG TABLET
ORAL_TABLET | Freq: Every day | ORAL | 8 refills | 30.00000 days | Status: CP
Start: 2019-04-24 — End: ?
  Filled 2019-04-29: qty 30, 30d supply, fill #0

## 2019-04-24 NOTE — Unmapped (Signed)
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     Completed refill call assessment today to schedule patient's medication shipment from the [facility] [phone]).       Specialty medication(s) and dose(s) confirmed: Regimen is correct and unchanged.   Changes to medications: Pranathi reports no changes at this time.  Changes to insurance: No  Questions for the pharmacist: No    Confirmed patient received Welcome Packet with first shipment. The patient will receive a drug information handout for each medication shipped and additional FDA Medication Guides as required.       DISEASE/MEDICATION-SPECIFIC INFORMATION        N/A    SPECIALTY MEDICATION ADHERENCE     Medication Adherence    Patient reported X missed doses in the last month:  0  Adherence tools used:  patient uses a pill box to manage medications            Myfortic 180mg : 10 days on hand      SHIPPING     Shipping address confirmed in Epic.     Delivery Scheduled: Yes, Expected medication delivery date: 04/30/19.     Medication will be delivered via UPS to the home address in [place].    Michelle Travis   [facility] Shared [facility] Specialty Technician

## 2019-04-25 DIAGNOSIS — Z9189 Other specified personal risk factors, not elsewhere classified: Secondary | ICD-10-CM | POA: Diagnosis not present

## 2019-04-25 DIAGNOSIS — G8929 Other chronic pain: Secondary | ICD-10-CM | POA: Diagnosis not present

## 2019-04-25 DIAGNOSIS — M25561 Pain in right knee: Secondary | ICD-10-CM | POA: Diagnosis not present

## 2019-04-25 DIAGNOSIS — M25562 Pain in left knee: Secondary | ICD-10-CM | POA: Diagnosis not present

## 2019-04-29 MED FILL — MYFORTIC 180 MG TABLET,DELAYED RELEASE: 30 days supply | Qty: 120 | Fill #4

## 2019-04-29 MED FILL — PANTOPRAZOLE 40 MG TABLET,DELAYED RELEASE: ORAL | 30 days supply | Qty: 30 | Fill #10

## 2019-04-29 MED FILL — ATORVASTATIN 10 MG TABLET: 30 days supply | Qty: 30 | Fill #0 | Status: AC

## 2019-04-29 MED FILL — PANTOPRAZOLE 40 MG TABLET,DELAYED RELEASE: 30 days supply | Qty: 30 | Fill #10 | Status: AC

## 2019-04-29 MED FILL — MYFORTIC 180 MG TABLET,DELAYED RELEASE: 30 days supply | Qty: 120 | Fill #4 | Status: AC

## 2019-05-06 NOTE — Unmapped (Signed)
Confirmed with patient to repeat labs now until the 7/21.  She verbalized understanding.

## 2019-05-13 ENCOUNTER — Institutional Professional Consult (permissible substitution)
Admit: 2019-05-13 | Discharge: 2019-05-14 | Payer: MEDICARE | Attending: Nurse Practitioner | Primary: Nurse Practitioner

## 2019-05-13 MED ORDER — AMOXICILLIN 500 MG CAPSULE
ORAL_CAPSULE | Freq: Two times a day (BID) | ORAL | 0 refills | 10 days | Status: CP
Start: 2019-05-13 — End: 2019-05-23

## 2019-05-13 NOTE — Unmapped (Signed)
Patient daughter asked what should be done since she thinks she has strep throat due to it being swollen, white patches, and having a fever.  PCP called but stated to call transplant.  Instructed daughter to contact PCP and they do whatever testing and assessment they feel is neccessary and then when it comes to prescribing medications to check with me to see if it is safe.

## 2019-05-13 NOTE — Unmapped (Signed)
Attempted to call patient, no answer. Voicemail left.

## 2019-05-19 NOTE — Unmapped (Signed)
Confirmed with patient to get labs sometime this week or next week.

## 2019-05-22 NOTE — Unmapped (Signed)
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     Completed refill call assessment today to schedule patient's medication shipment from the [facility] [phone]).       Specialty medication(s) and dose(s) confirmed: Regimen is correct and unchanged.   Changes to medications: Michelle Travis reports no changes reported at this time.  Changes to insurance: No  Questions for the pharmacist: No    Confirmed patient received Welcome Packet with first shipment. The patient will receive a drug information handout for each medication shipped and additional FDA Medication Guides as required.       DISEASE/MEDICATION-SPECIFIC INFORMATION        N/A    SPECIALTY MEDICATION ADHERENCE     Medication Adherence    Patient reported X missed doses in the last month: 0  Specialty Medication: Myfortic 180mg   Patient is on additional specialty medications: Yes  Additional Specialty Medications: Envarsus XR 1mg   Patient Reported Additional Medication X Missed Doses in the Last Month: 0  Patient is on more than two specialty medications: No  Adherence tools used: patient uses a pill box to manage medications        Myfortic 180 mg: 12 days of medicine on hand     SHIPPING     Shipping address confirmed in Epic.     Delivery Scheduled: Yes, Expected medication delivery date: 06/03/2019.     Medication will be delivered via UPS to the home address in [employer] [place].    Michelle Travis P Allena Katz   [facility] Shared [facility] Specialty Technician

## 2019-05-29 LAB — CBC W/ DIFFERENTIAL
BANDED NEUTROPHILS ABSOLUTE COUNT: 0 10*3/uL (ref 0.0–0.1)
BASOPHILS ABSOLUTE COUNT: 0.1 10*3/uL (ref 0.0–0.2)
BASOPHILS RELATIVE PERCENT: 1 %
EOSINOPHILS ABSOLUTE COUNT: 0.1 10*3/uL (ref 0.0–0.4)
EOSINOPHILS RELATIVE PERCENT: 2 %
HEMATOCRIT: 40.7 % (ref 34.0–46.6)
HEMOGLOBIN: 12.9 g/dL (ref 11.1–15.9)
IMMATURE GRANULOCYTES: 0 %
LYMPHOCYTES RELATIVE PERCENT: 38 %
MEAN CORPUSCULAR HEMOGLOBIN CONC: 31.7 g/dL (ref 31.5–35.7)
MEAN CORPUSCULAR HEMOGLOBIN: 25.3 pg — ABNORMAL LOW (ref 26.6–33.0)
MEAN CORPUSCULAR VOLUME: 80 fL (ref 79–97)
MONOCYTES ABSOLUTE COUNT: 0.5 10*3/uL (ref 0.1–0.9)
NEUTROPHILS ABSOLUTE COUNT: 3.2 10*3/uL (ref 1.4–7.0)
NEUTROPHILS RELATIVE PERCENT: 50 %
PLATELET COUNT: 364 10*3/uL (ref 150–450)
RED CELL DISTRIBUTION WIDTH: 14.8 % (ref 11.7–15.4)
WHITE BLOOD CELL COUNT: 6.3 10*3/uL (ref 3.4–10.8)

## 2019-05-29 LAB — COMPREHENSIVE METABOLIC PANEL
A/G RATIO: 1.8 (ref 1.2–2.2)
ALKALINE PHOSPHATASE: 72 IU/L (ref 39–117)
ALT (SGPT): 20 IU/L (ref 0–32)
AST (SGOT): 26 IU/L (ref 0–40)
BILIRUBIN TOTAL: 0.5 mg/dL (ref 0.0–1.2)
BLOOD UREA NITROGEN: 11 mg/dL (ref 8–27)
BUN / CREAT RATIO: 15 (ref 12–28)
CALCIUM: 9.5 mg/dL (ref 8.7–10.3)
CHLORIDE: 103 mmol/L (ref 96–106)
CO2: 23 mmol/L (ref 20–29)
GLOBULIN, TOTAL: 2.4 g/dL (ref 1.5–4.5)
GLUCOSE: 98 mg/dL (ref 65–99)
POTASSIUM: 4.7 mmol/L (ref 3.5–5.2)
SODIUM: 142 mmol/L (ref 134–144)
TOTAL PROTEIN: 6.8 g/dL (ref 6.0–8.5)

## 2019-05-29 LAB — WHITE BLOOD CELL COUNT: Leukocytes:NCnc:Pt:Bld:Qn:Automated count: 6.3

## 2019-05-29 LAB — BILIRUBIN DIRECT: Bilirubin.glucuronidated+Bilirubin.albumin bound:MCnc:Pt:Ser/Plas:Qn:: 0.13

## 2019-05-29 LAB — SODIUM: Sodium:SCnc:Pt:Ser/Plas:Qn:: 142

## 2019-05-29 LAB — MAGNESIUM
MAGNESIUM: 1.6 mg/dL (ref 1.6–2.3)
Magnesium:MCnc:Pt:Ser/Plas:Qn:: 1.6

## 2019-05-29 LAB — GAMMA GLUTAMYL TRANSFERASE: Gamma glutamyl transferase:CCnc:Pt:Ser/Plas:Qn:: 47

## 2019-05-29 LAB — PHOSPHORUS, SERUM: Phosphate:MCnc:Pt:Ser/Plas:Qn:: 3.2

## 2019-05-30 LAB — TACROLIMUS BLOOD: Tacrolimus:MCnc:Pt:Bld:Qn:LC/MS/MS: 4.6

## 2019-06-02 MED FILL — MYFORTIC 180 MG TABLET,DELAYED RELEASE: 30 days supply | Qty: 120 | Fill #5 | Status: AC

## 2019-06-02 MED FILL — MYFORTIC 180 MG TABLET,DELAYED RELEASE: 30 days supply | Qty: 120 | Fill #5

## 2019-06-02 MED FILL — ATORVASTATIN 10 MG TABLET: 30 days supply | Qty: 30 | Fill #1 | Status: AC

## 2019-06-02 MED FILL — PANTOPRAZOLE 40 MG TABLET,DELAYED RELEASE: 30 days supply | Qty: 30 | Fill #11 | Status: AC

## 2019-06-02 MED FILL — ATORVASTATIN 10 MG TABLET: ORAL | 30 days supply | Qty: 30 | Fill #1

## 2019-06-02 MED FILL — PANTOPRAZOLE 40 MG TABLET,DELAYED RELEASE: ORAL | 30 days supply | Qty: 30 | Fill #11

## 2019-06-09 MED ORDER — ENVARSUS XR 1 MG TABLET,EXTENDED RELEASE
ORAL_TABLET | Freq: Every day | ORAL | 3 refills | 90 days | Status: CP
Start: 2019-06-09 — End: 2020-06-08

## 2019-06-27 NOTE — Unmapped (Signed)
[facility] Shared [facility] Specialty Pharmacy Clinical Assessment & Refill Coordination Note    Michelle Travis, DOB: Aug 18, 1956  Phone: [phone] (home) [phone] (work)    All above HIPAA information was verified with patient.     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 aspirin (ECOTRIN) 81 MG tablet Take 1 tablet (81 mg total) by mouth daily. 30 tablet 11   ??? atorvastatin (LIPITOR) 10 MG tablet Take 1 tablet (10 mg total) by mouth daily. 30 tablet 8   ??? blood sugar diagnostic Strp by Other route daily. 35 each 3   ??? blood-glucose meter Misc Check Blood Sugar daily in the afternoons and as needed while taking liraglutide 1 each 0   ??? empty container Misc USE AS DIRECTED 1 each 2   ??? ENVARSUS XR 1 mg Tb24 extended release tablet Take 2 mg by mouth daily. 180 tablet 3   ??? lancets Misc Use when checking blood sugar. 100 each 0   ??? MYFORTIC 180 mg EC tablet TAKE 2 TABLETS (360 MG) BY MOUTH TWICE DAILY 120 tablet 11   ??? oxyCODONE (OXY-IR) 5 mg capsule Take 5 mg by mouth every six (6) hours as needed for pain.     ??? pantoprazole (PROTONIX) 40 MG tablet Take 1 tablet (40 mg total) by mouth daily. 30 tablet 0   ??? pantoprazole (PROTONIX) 40 MG tablet TAKE 1 TABLET BY MOUTH ONCE DAILY 30 tablet PRN   ??? pen needle, diabetic 32 gauge x 1/4 Ndle 1 each by Miscellaneous route daily. (Patient not taking: Reported on 01/28/2019) 90 each 3     No current facility-administered medications for this visit.         Changes to medications: Dasja reports no changes at this time.    Allergies   Allergen Reactions   ??? Latex Swelling and Rash     Discoloration of skin.   Discoloration of skin.    ??? Hydrocodone Itching   ??? Hydrocodone-Acetaminophen Itching       Changes to allergies: No    SPECIALTY MEDICATION ADHERENCE     Myfortic 180mg   : 10 days of medicine on hand     Medication Adherence    Patient reported X missed doses in the last month: 0 Specialty Medication: myfortic 180mg   Adherence tools used: patient uses a pill box to manage medications          Specialty medication(s) dose(s) confirmed: Regimen is correct and unchanged.     Are there any concerns with adherence? Yes: no concerns    Adherence counseling provided? Not needed    CLINICAL MANAGEMENT AND INTERVENTION      Clinical Benefit Assessment:    Do you feel the medicine is effective or helping your condition? Yes    Clinical Benefit counseling provided? Not needed    Adverse Effects Assessment:    Are you experiencing any side effects? No    Are you experiencing difficulty administering your medicine? No    Quality of Life Assessment:    How many days over the past month did your transplant  keep you from your normal activities? For example, brushing your teeth or getting up in the morning. 0    Have you discussed this with your provider? Not needed    Therapy Appropriateness:    Is therapy appropriate? Yes, therapy is appropriate and should be continued    DISEASE/MEDICATION-SPECIFIC  INFORMATION      N/A    PATIENT SPECIFIC NEEDS     ? Does the patient have any physical, cognitive, or cultural barriers? No    ? Is the patient high risk? Yes, patient taking a REMS drug     ? Does the patient require a Care Management Plan? No     ? Does the patient require physician intervention or other additional services (i.e. nutrition, smoking cessation, social work)? 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     Medication will be delivered via UPS to the confirmed home address in [facility].    The patient will receive a drug information handout for each medication shipped and additional FDA Medication Guides as required.  Verified that patient has previously received a Conservation officer, historic buildings.    All of the patient's questions and concerns have been addressed.    [name]   [facility] Specialty Pharmacist

## 2019-07-01 MED FILL — ATORVASTATIN 10 MG TABLET: ORAL | 30 days supply | Qty: 30 | Fill #2

## 2019-07-01 MED FILL — PANTOPRAZOLE 40 MG TABLET,DELAYED RELEASE: ORAL | 30 days supply | Qty: 30 | Fill #12

## 2019-07-01 MED FILL — ATORVASTATIN 10 MG TABLET: 30 days supply | Qty: 30 | Fill #2 | Status: AC

## 2019-07-01 MED FILL — MYFORTIC 180 MG TABLET,DELAYED RELEASE: 30 days supply | Qty: 120 | Fill #6 | Status: AC

## 2019-07-01 MED FILL — MYFORTIC 180 MG TABLET,DELAYED RELEASE: 30 days supply | Qty: 120 | Fill #6

## 2019-07-01 MED FILL — PANTOPRAZOLE 40 MG TABLET,DELAYED RELEASE: 30 days supply | Qty: 30 | Fill #12 | Status: AC

## 2019-07-07 DIAGNOSIS — Z944 Liver transplant status: Secondary | ICD-10-CM

## 2019-07-07 DIAGNOSIS — Z79899 Other long term (current) drug therapy: Secondary | ICD-10-CM

## 2019-07-18 LAB — BUN / CREAT RATIO: Urea nitrogen/Creatinine:MRto:Pt:Ser/Plas:Qn:: 17

## 2019-07-18 LAB — COMPREHENSIVE METABOLIC PANEL
A/G RATIO: 1.9 (ref 1.2–2.2)
ALBUMIN: 4.5 g/dL (ref 3.8–4.8)
ALKALINE PHOSPHATASE: 76 IU/L (ref 39–117)
ALT (SGPT): 19 IU/L (ref 0–32)
AST (SGOT): 24 IU/L (ref 0–40)
BLOOD UREA NITROGEN: 12 mg/dL (ref 8–27)
BUN / CREAT RATIO: 17 (ref 12–28)
CALCIUM: 9.5 mg/dL (ref 8.7–10.3)
CHLORIDE: 104 mmol/L (ref 96–106)
CO2: 24 mmol/L (ref 20–29)
GLOBULIN, TOTAL: 2.4 g/dL (ref 1.5–4.5)
POTASSIUM: 4.4 mmol/L (ref 3.5–5.2)
SODIUM: 142 mmol/L (ref 134–144)
TOTAL PROTEIN: 6.9 g/dL (ref 6.0–8.5)

## 2019-07-18 LAB — CBC W/ DIFFERENTIAL
BANDED NEUTROPHILS ABSOLUTE COUNT: 0 10*3/uL (ref 0.0–0.1)
BASOPHILS ABSOLUTE COUNT: 0.1 10*3/uL (ref 0.0–0.2)
BASOPHILS RELATIVE PERCENT: 1 %
EOSINOPHILS ABSOLUTE COUNT: 0.2 10*3/uL (ref 0.0–0.4)
EOSINOPHILS RELATIVE PERCENT: 4 %
HEMATOCRIT: 41.6 % (ref 34.0–46.6)
HEMOGLOBIN: 13 g/dL (ref 11.1–15.9)
IMMATURE GRANULOCYTES: 0 %
LYMPHOCYTES ABSOLUTE COUNT: 1.8 10*3/uL (ref 0.7–3.1)
LYMPHOCYTES RELATIVE PERCENT: 35 %
MEAN CORPUSCULAR HEMOGLOBIN CONC: 31.3 g/dL — ABNORMAL LOW (ref 31.5–35.7)
MEAN CORPUSCULAR HEMOGLOBIN: 26.2 pg — ABNORMAL LOW (ref 26.6–33.0)
MONOCYTES ABSOLUTE COUNT: 0.5 10*3/uL (ref 0.1–0.9)
MONOCYTES RELATIVE PERCENT: 10 %
NEUTROPHILS ABSOLUTE COUNT: 2.6 10*3/uL (ref 1.4–7.0)
NEUTROPHILS RELATIVE PERCENT: 50 %
RED BLOOD CELL COUNT: 4.97 x10E6/uL (ref 3.77–5.28)
RED CELL DISTRIBUTION WIDTH: 14.6 % (ref 11.7–15.4)
WHITE BLOOD CELL COUNT: 5.2 10*3/uL (ref 3.4–10.8)

## 2019-07-18 LAB — BILIRUBIN DIRECT: Bilirubin.glucuronidated+Bilirubin.albumin bound:MCnc:Pt:Ser/Plas:Qn:: 0.16

## 2019-07-18 LAB — PHOSPHORUS, SERUM: Phosphate:MCnc:Pt:Ser/Plas:Qn:: 3.3

## 2019-07-18 LAB — GAMMA GLUTAMYL TRANSFERASE: Gamma glutamyl transferase:CCnc:Pt:Ser/Plas:Qn:: 41

## 2019-07-18 LAB — MAGNESIUM: Magnesium:MCnc:Pt:Ser/Plas:Qn:: 1.7

## 2019-07-18 LAB — BANDED NEUTROPHILS ABSOLUTE COUNT: Granulocytes.immature:NCnc:Pt:Bld:Qn:Automated count: 0

## 2019-07-19 LAB — TACROLIMUS BLOOD: Tacrolimus:MCnc:Pt:Bld:Qn:LC/MS/MS: 3.8

## 2019-07-22 DIAGNOSIS — Z79899 Other long term (current) drug therapy: Secondary | ICD-10-CM

## 2019-07-22 DIAGNOSIS — Z940 Kidney transplant status: Secondary | ICD-10-CM

## 2019-07-22 DIAGNOSIS — Z944 Liver transplant status: Secondary | ICD-10-CM

## 2019-07-30 NOTE — Unmapped (Signed)
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     Completed refill call assessment today to schedule patient's medication shipment from the [facility] [phone]).       Specialty medication(s) and dose(s) confirmed: Regimen is correct and unchanged.   Changes to medications: [name] reports no changes at this time.  Changes to insurance: No  Questions for the pharmacist: No    Confirmed patient received Welcome Packet with first shipment. The patient will receive a drug information handout for each medication shipped and additional FDA Medication Guides as required.       DISEASE/MEDICATION-SPECIFIC INFORMATION        N/A    SPECIALTY MEDICATION ADHERENCE     Medication Adherence    Patient reported X missed doses in the last month: 0  Specialty Medication: Myfortic 180mg   Patient is on additional specialty medications: No  Adherence tools used: patient uses a pill box to manage medications          Myfortic 180 mg: 7 days of medicine on hand     SHIPPING     Shipping address confirmed in Epic.     Delivery Scheduled: Yes, Expected medication delivery date: 08/05/2019.     Medication will be delivered via UPS to the home address in [employer] [place].    Michelle Travis   [facility] Specialty Technician

## 2019-07-31 MED ORDER — PANTOPRAZOLE 40 MG TABLET,DELAYED RELEASE
ORAL_TABLET | Freq: Every day | ORAL | PRN refills | 30.00000 days | Status: CP
Start: 2019-07-31 — End: ?
  Filled 2019-08-04: qty 30, 30d supply, fill #0

## 2019-08-04 DIAGNOSIS — Z944 Liver transplant status: Secondary | ICD-10-CM

## 2019-08-04 DIAGNOSIS — Z79899 Other long term (current) drug therapy: Secondary | ICD-10-CM

## 2019-08-04 MED FILL — ATORVASTATIN 10 MG TABLET: ORAL | 30 days supply | Qty: 30 | Fill #3

## 2019-08-04 MED FILL — MYFORTIC 180 MG TABLET,DELAYED RELEASE: 30 days supply | Qty: 120 | Fill #7

## 2019-08-04 MED FILL — PANTOPRAZOLE 40 MG TABLET,DELAYED RELEASE: 30 days supply | Qty: 30 | Fill #0 | Status: AC

## 2019-08-04 MED FILL — MYFORTIC 180 MG TABLET,DELAYED RELEASE: 30 days supply | Qty: 120 | Fill #7 | Status: AC

## 2019-08-04 MED FILL — ATORVASTATIN 10 MG TABLET: 30 days supply | Qty: 30 | Fill #3 | Status: AC

## 2019-08-27 NOTE — Unmapped (Signed)
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     Completed refill call assessment today to schedule patient's medication shipment from the [facility] [phone]).       Specialty medication(s) and dose(s) confirmed: Regimen is correct and unchanged.   Changes to medications: Tine reports no changes at this time.  Changes to insurance: No  Questions for the pharmacist: No    Confirmed patient received Welcome Packet with first shipment. The patient will receive a drug information handout for each medication shipped and additional FDA Medication Guides as required.       DISEASE/MEDICATION-SPECIFIC INFORMATION        N/A    SPECIALTY MEDICATION ADHERENCE     Medication Adherence    Patient reported X missed doses in the last month: 0  Specialty Medication: Myfortic 180mg   Patient is on additional specialty medications: No  Adherence tools used: patient uses a pill box to manage medications          Myfortic 180 mg: 7 days of medicine on hand     SHIPPING     Shipping address confirmed in Epic.     Delivery Scheduled: Yes, Expected medication delivery date: 09/03/2019.     Medication will be delivered via UPS to the home address in [employer] [place].    Oretha Milch   [facility] Specialty Technician

## 2019-09-01 DIAGNOSIS — Z79899 Other long term (current) drug therapy: Principal | ICD-10-CM

## 2019-09-01 DIAGNOSIS — Z944 Liver transplant status: Principal | ICD-10-CM

## 2019-09-02 MED FILL — MYFORTIC 180 MG TABLET,DELAYED RELEASE: 30 days supply | Qty: 120 | Fill #8 | Status: AC

## 2019-09-02 MED FILL — MYFORTIC 180 MG TABLET,DELAYED RELEASE: 30 days supply | Qty: 120 | Fill #8

## 2019-09-19 LAB — COMPREHENSIVE METABOLIC PANEL
A/G RATIO: 1.8 (ref 1.2–2.2)
ALBUMIN: 4.3 g/dL (ref 3.8–4.8)
ALKALINE PHOSPHATASE: 90 IU/L (ref 39–117)
ALT (SGPT): 23 IU/L (ref 0–32)
AST (SGOT): 28 IU/L (ref 0–40)
BILIRUBIN TOTAL: 0.4 mg/dL (ref 0.0–1.2)
BLOOD UREA NITROGEN: 9 mg/dL (ref 8–27)
BUN / CREAT RATIO: 13 (ref 12–28)
CALCIUM: 9.3 mg/dL (ref 8.7–10.3)
CHLORIDE: 104 mmol/L (ref 96–106)
CREATININE: 0.68 mg/dL (ref 0.57–1.00)
GLOBULIN, TOTAL: 2.4 g/dL (ref 1.5–4.5)
POTASSIUM: 4.2 mmol/L (ref 3.5–5.2)
TOTAL PROTEIN: 6.7 g/dL (ref 6.0–8.5)

## 2019-09-19 LAB — CBC W/ DIFFERENTIAL
BANDED NEUTROPHILS ABSOLUTE COUNT: 0 10*3/uL (ref 0.0–0.1)
BASOPHILS ABSOLUTE COUNT: 0.1 10*3/uL (ref 0.0–0.2)
BASOPHILS RELATIVE PERCENT: 1 %
EOSINOPHILS ABSOLUTE COUNT: 0.2 10*3/uL (ref 0.0–0.4)
EOSINOPHILS RELATIVE PERCENT: 3 %
HEMATOCRIT: 40.6 % (ref 34.0–46.6)
HEMOGLOBIN: 12.7 g/dL (ref 11.1–15.9)
LYMPHOCYTES ABSOLUTE COUNT: 2.3 10*3/uL (ref 0.7–3.1)
LYMPHOCYTES RELATIVE PERCENT: 38 %
MEAN CORPUSCULAR HEMOGLOBIN: 26.5 pg — ABNORMAL LOW (ref 26.6–33.0)
MEAN CORPUSCULAR VOLUME: 85 fL (ref 79–97)
MONOCYTES ABSOLUTE COUNT: 0.6 10*3/uL (ref 0.1–0.9)
MONOCYTES RELATIVE PERCENT: 10 %
NEUTROPHILS ABSOLUTE COUNT: 2.8 10*3/uL (ref 1.4–7.0)
NEUTROPHILS RELATIVE PERCENT: 48 %
PLATELET COUNT: 293 10*3/uL (ref 150–450)
RED BLOOD CELL COUNT: 4.8 x10E6/uL (ref 3.77–5.28)
RED CELL DISTRIBUTION WIDTH: 13.7 % (ref 11.7–15.4)

## 2019-09-19 LAB — BILIRUBIN DIRECT: Bilirubin.glucuronidated+Bilirubin.albumin bound:MCnc:Pt:Ser/Plas:Qn:: 0.11

## 2019-09-19 LAB — BLOOD UREA NITROGEN: Urea nitrogen:MCnc:Pt:Ser/Plas:Qn:: 9

## 2019-09-19 LAB — GAMMA GLUTAMYL TRANSFERASE: Gamma glutamyl transferase:CCnc:Pt:Ser/Plas:Qn:: 41

## 2019-09-19 LAB — MONOCYTES RELATIVE PERCENT: Monocytes/100 leukocytes:NFr:Pt:Bld:Qn:Automated count: 10

## 2019-09-19 LAB — PHOSPHORUS, SERUM: Phosphate:MCnc:Pt:Ser/Plas:Qn:: 2.6 — ABNORMAL LOW

## 2019-09-19 LAB — MAGNESIUM: Magnesium:MCnc:Pt:Ser/Plas:Qn:: 1.6

## 2019-09-20 LAB — TACROLIMUS BLOOD: Tacrolimus:MCnc:Pt:Bld:Qn:LC/MS/MS: 3.4

## 2019-09-22 NOTE — Unmapped (Signed)
Left VM per PA Vonderau to increase diet in phosphorus things like whole wheat, sodas (diet preferred), cheeses/dairy, and meats like chicken, beef, and fish. Requested call back confirming VM was received.

## 2019-09-30 NOTE — Unmapped (Signed)
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     Completed refill call assessment today to schedule patient's medication shipment from the [facility] [phone]).       Specialty medication(s) and dose(s) confirmed: Regimen is correct and unchanged.   Changes to medications: Lajada reports no changes at this time.  Changes to insurance: No  Questions for the pharmacist: No    Confirmed patient received Welcome Packet with first shipment. The patient will receive a drug information handout for each medication shipped and additional FDA Medication Guides as required.       DISEASE/MEDICATION-SPECIFIC INFORMATION        N/A    SPECIALTY MEDICATION ADHERENCE     Medication Adherence    Patient reported X missed doses in the last month: 0  Specialty Medication: myfortic 180mg   Patient is on additional specialty medications: No  Informant: patient  Adherence tools used: patient uses a pill box to manage medications                Myfortic 180 mg: 7 days of medicine on hand         SHIPPING     Shipping address confirmed in Epic.     Delivery Scheduled: Yes, Expected medication delivery date: 12/03.     Medication will be delivered via UPS to the prescription address in [facility] [place].    Antonietta Barcelona   [facility] Specialty Technician

## 2019-10-02 MED FILL — MYFORTIC 180 MG TABLET,DELAYED RELEASE: 30 days supply | Qty: 120 | Fill #9

## 2019-10-02 MED FILL — MYFORTIC 180 MG TABLET,DELAYED RELEASE: 30 days supply | Qty: 120 | Fill #9 | Status: AC

## 2019-10-22 NOTE — Unmapped (Signed)
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     Was a Nurse, learning disability used for this call? No    Completed refill call assessment today to schedule patient's medication shipment from the [facility] [phone]).       Specialty medication(s) and dose(s) confirmed: Regimen is correct and unchanged.   Changes to medications: Michelle Travis reports no changes at this time.  Changes to insurance: No  Questions for the pharmacist: No    Confirmed patient received Welcome Packet with first shipment. The patient will receive a drug information handout for each medication shipped and additional FDA Medication Guides as required.       DISEASE/MEDICATION-SPECIFIC INFORMATION        N/A    SPECIALTY MEDICATION ADHERENCE     Medication Adherence    Patient reported X missed doses in the last month: 0  Specialty Medication: Myfortic 180mg   Patient is on additional specialty medications: No  Informant: patient  Adherence tools used: patient uses a pill box to manage medications                myfortic 180 mg: 10 days of medicine on hand           SHIPPING     Shipping address confirmed in Epic.     Delivery Scheduled: Yes, Expected medication delivery date: 12/29.     Medication will be delivered via UPS to the prescription address in Epic WAM.    Antonietta Barcelona   [facility] Specialty Technician

## 2019-10-23 LAB — COMPREHENSIVE METABOLIC PANEL
A/G RATIO: 1.7 (ref 1.2–2.2)
ALKALINE PHOSPHATASE: 89 IU/L (ref 39–117)
ALT (SGPT): 20 IU/L (ref 0–32)
BILIRUBIN TOTAL: 0.4 mg/dL (ref 0.0–1.2)
BLOOD UREA NITROGEN: 13 mg/dL (ref 8–27)
BUN / CREAT RATIO: 19 (ref 12–28)
CALCIUM: 9.6 mg/dL (ref 8.7–10.3)
CHLORIDE: 106 mmol/L (ref 96–106)
CO2: 22 mmol/L (ref 20–29)
CREATININE: 0.67 mg/dL (ref 0.57–1.00)
GLOBULIN, TOTAL: 2.5 g/dL (ref 1.5–4.5)
GLUCOSE: 99 mg/dL (ref 65–99)
POTASSIUM: 4.8 mmol/L (ref 3.5–5.2)
TOTAL PROTEIN: 6.8 g/dL (ref 6.0–8.5)

## 2019-10-23 LAB — CBC W/ DIFFERENTIAL
BANDED NEUTROPHILS ABSOLUTE COUNT: 0 10*3/uL (ref 0.0–0.1)
BASOPHILS ABSOLUTE COUNT: 0.1 10*3/uL (ref 0.0–0.2)
BASOPHILS RELATIVE PERCENT: 1 %
EOSINOPHILS ABSOLUTE COUNT: 0.2 10*3/uL (ref 0.0–0.4)
EOSINOPHILS RELATIVE PERCENT: 2 %
HEMOGLOBIN: 13.1 g/dL (ref 11.1–15.9)
IMMATURE GRANULOCYTES: 0 %
LYMPHOCYTES ABSOLUTE COUNT: 2.1 10*3/uL (ref 0.7–3.1)
LYMPHOCYTES RELATIVE PERCENT: 31 %
MEAN CORPUSCULAR HEMOGLOBIN CONC: 32.8 g/dL (ref 31.5–35.7)
MEAN CORPUSCULAR VOLUME: 82 fL (ref 79–97)
MONOCYTES ABSOLUTE COUNT: 0.7 10*3/uL (ref 0.1–0.9)
MONOCYTES RELATIVE PERCENT: 10 %
NEUTROPHILS RELATIVE PERCENT: 56 %
PLATELET COUNT: 341 10*3/uL (ref 150–450)
RED BLOOD CELL COUNT: 4.87 x10E6/uL (ref 3.77–5.28)
RED CELL DISTRIBUTION WIDTH: 13.9 % (ref 11.7–15.4)
WHITE BLOOD CELL COUNT: 6.6 10*3/uL (ref 3.4–10.8)

## 2019-10-23 LAB — MAGNESIUM
MAGNESIUM: 1.8 mg/dL (ref 1.6–2.3)
Magnesium:MCnc:Pt:Ser/Plas:Qn:: 1.8

## 2019-10-23 LAB — MONOCYTES ABSOLUTE COUNT: Monocytes:NCnc:Pt:Bld:Qn:Automated count: 0.7

## 2019-10-23 LAB — PHOSPHORUS, SERUM: Phosphate:MCnc:Pt:Ser/Plas:Qn:: 2.4 — ABNORMAL LOW

## 2019-10-23 LAB — AST (SGOT): Aspartate aminotransferase:CCnc:Pt:Ser/Plas:Qn:: 25

## 2019-10-23 LAB — BILIRUBIN DIRECT: Bilirubin.glucuronidated+Bilirubin.albumin bound:MCnc:Pt:Ser/Plas:Qn:: 0.11

## 2019-10-23 LAB — GAMMA GLUTAMYL TRANSFERASE: Gamma glutamyl transferase:CCnc:Pt:Ser/Plas:Qn:: 44

## 2019-10-25 LAB — TACROLIMUS BLOOD: Tacrolimus:MCnc:Pt:Bld:Qn:LC/MS/MS: 3.5

## 2019-10-27 DIAGNOSIS — Z79899 Other long term (current) drug therapy: Principal | ICD-10-CM

## 2019-10-27 DIAGNOSIS — Z944 Liver transplant status: Principal | ICD-10-CM

## 2019-10-27 MED FILL — MYFORTIC 180 MG TABLET,DELAYED RELEASE: 30 days supply | Qty: 120 | Fill #10 | Status: AC

## 2019-10-27 MED FILL — MYFORTIC 180 MG TABLET,DELAYED RELEASE: 30 days supply | Qty: 120 | Fill #10

## 2019-10-28 NOTE — Unmapped (Signed)
Confirmed with patient labs look good.

## 2019-11-05 NOTE — Unmapped (Signed)
Called patients preferred number to discuss scheudling txp annual appointments, unable to speak with patient, family member gave phone number [phone] to call.    Called [phone], unable to reach patient, left detailed VM requesting a call back at [phone].

## 2019-11-11 NOTE — Unmapped (Signed)
Called patient at [phone] to discuss scheduling txp annual appointments. As per patient preference appointments scheduled on 2/22. Provided patient with appointment date, time, location. Patient stated verbal understanding. Appointment letter mailed to patient via Epic letter.

## 2019-11-12 ENCOUNTER — Telehealth: Payer: Self-pay | Admitting: Adult Health

## 2019-11-12 NOTE — Telephone Encounter (Signed)
Called patient regarding appointment and the following message was left: ° ° °We have you scheduled for an upcoming appointment at our office. At this time, we are still not allowing visitors during the appointment, however, a support person, over age 65, may accompany you to your appointment if assistance is needed for safety or care concerns. Otherwise, support persons should remain outside until the visit is complete.  ° °We ask if you are sick, have any symptoms of COVID, have had any exposure to anyone suspected or confirmed of having COVID-19, or are awaiting test results for COVID-19, to call our office as we may need to reschedule you for a virtual visit or schedule your appointment for a later date.   ° °Please know we will ask you these questions or similar questions when you arrive for your appointment and understand this is how we are keeping everyone safe.   ° °Also,to keep you safe, please use the provided hand sanitizer when you enter the office. We are asking everyone in the office to wear a mask to help prevent the spread of °germs. If you have a mask of your own, please wear it to your appointment, if not, we are happy to provide one for you. ° °Thank you for understanding and your cooperation.  ° ° °[facility] Staff ° ° ° ° ° °

## 2019-11-13 ENCOUNTER — Ambulatory Visit: Payer: Medicare HMO | Admitting: Adult Health

## 2019-11-13 ENCOUNTER — Other Ambulatory Visit (HOSPITAL_COMMUNITY)
Admission: RE | Admit: 2019-11-13 | Discharge: 2019-11-13 | Disposition: A | Discharge status: Home / Self Care | Payer: Medicare HMO | Source: Ambulatory Visit | Attending: Adult Health | Admitting: Adult Health

## 2019-11-13 ENCOUNTER — Other Ambulatory Visit: Payer: Self-pay

## 2019-11-13 ENCOUNTER — Encounter: Payer: Self-pay | Admitting: Adult Health

## 2019-11-13 VITALS — BP 143/91 | HR 61 | Ht 62.0 in | Wt 187.0 lb

## 2019-11-13 DIAGNOSIS — Z1212 Encounter for screening for malignant neoplasm of rectum: Secondary | ICD-10-CM

## 2019-11-13 DIAGNOSIS — Z01419 Encounter for gynecological examination (general) (routine) without abnormal findings: Secondary | ICD-10-CM | POA: Insufficient documentation

## 2019-11-13 DIAGNOSIS — Z1211 Encounter for screening for malignant neoplasm of colon: Secondary | ICD-10-CM

## 2019-11-13 DIAGNOSIS — Z1151 Encounter for screening for human papillomavirus (HPV): Secondary | ICD-10-CM | POA: Diagnosis not present

## 2019-11-13 LAB — HEMOCCULT GUIAC POC 1CARD (OFFICE): Fecal Occult Blood, POC: NEGATIVE

## 2019-11-13 NOTE — Progress Notes (Signed)
Patient ID: Savannah Jackson, female   DOB: 05-Dec-1955, 64 y.o.   MRN: [id number] History of Present Illness: Savannah Jackson is a 64 year old black female, widowed, PM in for a pelvic and pap smear, she had physical with PCP.She had breast cancer in 2002 and liver transplant in 2018 in [place] and has appt with them 12/25/19. PCP is Dr Legrand Rams.   Current Medications, Allergies, Past Medical History, Past Surgical History, Family History and Social History were reviewed in [employer] record.     Review of Systems: Patient denies any headaches, hearing loss, fatigue, blurred vision, shortness of breath, chest pain, abdominal pain, problems with bowel movements, urination, or intercourse(not active). No mood swings. Has pain in knees, needs replacement,uses cane. She denies any spotting or vaginal bleeding.   Physical Exam:BP (!) 143/91 (BP Location: Left Arm, Patient Position: Sitting, Cuff Size: Normal)   Pulse 61   Ht 5\' 2"  (1.575 m)   Wt 187 lb (84.8 kg)   BMI 34.20 kg/m  General:  Well developed, well nourished, no acute distress Skin:  Warm and dry Neck:  Midline trachea, normal thyroid, good ROM, no lymphadenopathy,no carotid bruits heard.  Lungs; Clear to auscultation bilaterally Cardiovascular: Regular rate and rhythm Pelvic:  External genitalia is normal in appearance, no lesions.  The vagina is pale with loss of moisture and rugae. Urethra has no lesions or masses. The cervix is smooth and stenotic at os, pap with high risk HPV 16/18 genotyping performed.  Uterus is felt to be normal size, shape, and contour.  No adnexal masses or tenderness noted.Bladder is non tender, no masses felt. Rectal: Good sphincter tone, no polyps, or hemorrhoids felt.  Hemoccult negative. Extremities/musculoskeletal:  No swelling or varicosities noted, no clubbing or cyanosis, has brace left knee Psych:  No mood changes, alert and cooperative,seems happy Fall risk is low PHQ 2 score is  0. Examination chaperoned by Levy Pupa LPN  Impression and paln: 1. Encounter for gynecological examination with Papanicolaou smear of cervix Pap sent Pap in 3 years if normal Physical and labs with PCP Mammogram yearly  2. Screening for colorectal cancer Colonoscopy per GI

## 2019-11-14 LAB — CYTOLOGY - PAP
Adequacy: ABSENT
Diagnosis: NEGATIVE
High risk HPV: NEGATIVE

## 2019-11-14 NOTE — Unmapped (Signed)
[facility] Clinical Assessment & Refill Coordination Note    Michelle Travis, DOB: 1955-11-22  Phone: [phone] (home) [phone] (work)    All above HIPAA information was verified with patient.     Was a Nurse, learning disability used for this call? No    Specialty Medication(s):   Myfortic 180mg   Pt verified envarsus still comes from mfg     Current Outpatient Medications   Medication Sig Dispense Refill   ??? aspirin (ECOTRIN) 81 MG tablet Take 1 tablet (81 mg total) by mouth daily. 30 tablet 11   ??? atorvastatin (LIPITOR) 10 MG tablet Take 1 tablet (10 mg total) by mouth daily. 30 tablet 8   ??? blood sugar diagnostic Strp by Other route daily. 35 each 3   ??? blood-glucose meter Misc Check Blood Sugar daily in the afternoons and as needed while taking liraglutide 1 each 0   ??? empty container Misc USE AS DIRECTED 1 each 2   ??? ENVARSUS XR 1 mg Tb24 extended release tablet Take 2 mg by mouth daily. 180 tablet 3   ??? lancets Misc Use when checking blood sugar. 100 each 0   ??? MYFORTIC 180 mg EC tablet TAKE 2 TABLETS (360 MG) BY MOUTH TWICE DAILY 120 tablet 11   ??? oxyCODONE (OXY-IR) 5 mg capsule Take 5 mg by mouth every six (6) hours as needed for pain.     ??? pantoprazole (PROTONIX) 40 MG tablet Take 1 tablet (40 mg total) by mouth daily. 30 tablet 0   ??? pantoprazole (PROTONIX) 40 MG tablet Take 1 tablet (40 mg total) by mouth daily. 30 tablet PRN     No current facility-administered medications for this visit.         Changes to medications: Tarica reports no changes at this time.    Allergies   Allergen Reactions   ??? Latex Swelling and Rash     Discoloration of skin.   Discoloration of skin.    ??? Hydrocodone Itching   ??? Hydrocodone-Acetaminophen Itching       Changes to allergies: No    SPECIALTY MEDICATION ADHERENCE     Myfortic 180mg : 12 days of medicine on hand    Medication Adherence    Patient reported X missed doses in the last month: 0  Specialty Medication: myfortic 180mg  Adherence tools used: patient uses a pill box to manage medications          Specialty medication(s) dose(s) confirmed: Regimen is correct and unchanged.     Are there any concerns with adherence? No    Adherence counseling provided? Not needed    CLINICAL MANAGEMENT AND INTERVENTION      Clinical Benefit Assessment:    Do you feel the medicine is effective or helping your condition? Yes    Clinical Benefit counseling provided? nanot needed    Adverse Effects Assessment:    Are you experiencing any side effects? No    Are you experiencing difficulty administering your medicine? No    Quality of Life Assessment:    How many days over the past month did your transplant  keep you from your normal activities? For example, brushing your teeth or getting up in the morning. 0    Have you discussed this with your provider? Not needed    Therapy Appropriateness:    Is therapy appropriate? Yes, therapy is appropriate and should be continued    DISEASE/MEDICATION-SPECIFIC INFORMATION      N/A    PATIENT  SPECIFIC NEEDS     ? Does the patient have any physical, cognitive, or cultural barriers? No    ? Is the patient high risk? Yes, patient is taking a REMS drug. Medication is dispensed in compliance with REMS program.     ? Does the patient require a Care Management Plan? No     ? Does the patient require physician intervention or other additional services (i.e. nutrition, smoking cessation, social work)? No      SHIPPING     Specialty Medication(s) to be Shipped:   Transplant: Myfortic 180mg     Other medication(s) to be shipped: na     Changes to insurance: No    Delivery Scheduled: Yes, Expected medication delivery date: 11/21/2019.     Medication will be delivered via UPS to the confirmed prescription address in [facility].    The patient will receive a drug information handout for each medication shipped and additional FDA Medication Guides as required.  Verified that patient has previously received a Conservation officer, historic buildings. All of the patient's questions and concerns have been addressed.    [name]   [facility] Specialty Pharmacist

## 2019-11-20 MED FILL — MYFORTIC 180 MG TABLET,DELAYED RELEASE: 30 days supply | Qty: 120 | Fill #11

## 2019-11-20 MED FILL — MYFORTIC 180 MG TABLET,DELAYED RELEASE: 30 days supply | Qty: 120 | Fill #11 | Status: AC

## 2019-12-09 LAB — BILIRUBIN DIRECT: Bilirubin.glucuronidated+Bilirubin.albumin bound:MCnc:Pt:Ser/Plas:Qn:: 0.09

## 2019-12-09 LAB — CBC W/ DIFFERENTIAL
BANDED NEUTROPHILS ABSOLUTE COUNT: 0 10*3/uL (ref 0.0–0.1)
BASOPHILS ABSOLUTE COUNT: 0.1 10*3/uL (ref 0.0–0.2)
BASOPHILS RELATIVE PERCENT: 1 %
EOSINOPHILS ABSOLUTE COUNT: 0.1 10*3/uL (ref 0.0–0.4)
EOSINOPHILS RELATIVE PERCENT: 2 %
HEMATOCRIT: 42.1 % (ref 34.0–46.6)
HEMOGLOBIN: 13.5 g/dL (ref 11.1–15.9)
IMMATURE GRANULOCYTES: 0 %
LYMPHOCYTES ABSOLUTE COUNT: 2.2 10*3/uL (ref 0.7–3.1)
MEAN CORPUSCULAR HEMOGLOBIN CONC: 32.1 g/dL (ref 31.5–35.7)
MEAN CORPUSCULAR HEMOGLOBIN: 26.1 pg — ABNORMAL LOW (ref 26.6–33.0)
MEAN CORPUSCULAR VOLUME: 81 fL (ref 79–97)
MONOCYTES RELATIVE PERCENT: 9 %
NEUTROPHILS ABSOLUTE COUNT: 3.2 10*3/uL (ref 1.4–7.0)
NEUTROPHILS RELATIVE PERCENT: 52 %
PLATELET COUNT: 347 10*3/uL (ref 150–450)
RED BLOOD CELL COUNT: 5.18 x10E6/uL (ref 3.77–5.28)
WHITE BLOOD CELL COUNT: 6.1 10*3/uL (ref 3.4–10.8)

## 2019-12-09 LAB — COMPREHENSIVE METABOLIC PANEL
A/G RATIO: 1.7 (ref 1.2–2.2)
ALBUMIN: 4.5 g/dL (ref 3.8–4.8)
ALKALINE PHOSPHATASE: 83 IU/L (ref 39–117)
ALT (SGPT): 16 IU/L (ref 0–32)
AST (SGOT): 19 IU/L (ref 0–40)
BILIRUBIN TOTAL: 0.3 mg/dL (ref 0.0–1.2)
CALCIUM: 10 mg/dL (ref 8.7–10.3)
CHLORIDE: 105 mmol/L (ref 96–106)
CO2: 21 mmol/L (ref 20–29)
CREATININE: 0.66 mg/dL (ref 0.57–1.00)
GLOBULIN, TOTAL: 2.6 g/dL (ref 1.5–4.5)
GLUCOSE: 100 mg/dL — ABNORMAL HIGH (ref 65–99)
SODIUM: 143 mmol/L (ref 134–144)
TOTAL PROTEIN: 7.1 g/dL (ref 6.0–8.5)

## 2019-12-09 LAB — GAMMA GLUTAMYL TRANSFERASE: Gamma glutamyl transferase:CCnc:Pt:Ser/Plas:Qn:: 39

## 2019-12-09 LAB — PHOSPHORUS, SERUM: Phosphate:MCnc:Pt:Ser/Plas:Qn:: 2.9 — ABNORMAL LOW

## 2019-12-09 LAB — LYMPHOCYTES ABSOLUTE COUNT: Lymphocytes:NCnc:Pt:Bld:Qn:Automated count: 2.2

## 2019-12-09 LAB — BUN / CREAT RATIO: Urea nitrogen/Creatinine:MRto:Pt:Ser/Plas:Qn:: 23

## 2019-12-09 LAB — MAGNESIUM: Magnesium:MCnc:Pt:Ser/Plas:Qn:: 1.8

## 2019-12-10 LAB — TACROLIMUS BLOOD: Tacrolimus:MCnc:Pt:Bld:Qn:LC/MS/MS: 2.4

## 2019-12-10 LAB — TACROLIMUS LEVEL: TACROLIMUS BLOOD: 2.4 ng/mL (ref 2.0–20.0)

## 2019-12-11 DIAGNOSIS — Z944 Liver transplant status: Principal | ICD-10-CM

## 2019-12-11 DIAGNOSIS — Z79899 Other long term (current) drug therapy: Principal | ICD-10-CM

## 2019-12-11 NOTE — Unmapped (Signed)
[facility] Refill Coordination Note    Specialty Medication(s) to be Shipped:   Transplant: Myfortic 180mg     Other medication(s) to be shipped: n/a     Michelle Travis, DOB: 05/20/1956  Phone: [phone] (home) [phone] (work)      All above HIPAA information was verified with patient.     Was a Nurse, learning disability used for this call? No    Completed refill call assessment today to schedule patient's medication shipment from the [facility] [phone]).       Specialty medication(s) and dose(s) confirmed: Regimen is correct and unchanged.   Changes to medications: Shahira reports no changes at this time.  Changes to insurance: No  Questions for the pharmacist: No    Confirmed patient received Welcome Packet with first shipment. The patient will receive a drug information handout for each medication shipped and additional FDA Medication Guides as required.       DISEASE/MEDICATION-SPECIFIC INFORMATION        N/A    SPECIALTY MEDICATION ADHERENCE     Medication Adherence    Patient reported X missed doses in the last month: 0  Specialty Medication: Myfortic 180 mg  Patient is on additional specialty medications: No  Any gaps in refill history greater than 2 weeks in the last 3 months: no  Demonstrates understanding of importance of adherence: yes  Informant: patient  Reliability of informant: reliable  Adherence tools used: patient uses a pill box to manage medications  Confirmed plan for next specialty medication refill: delivery by pharmacy  Refills needed for supportive medications: not needed                Myfortic 180 mg. 14 days on hand      SHIPPING     Shipping address confirmed in Epic.     Delivery Scheduled: Yes, Expected medication delivery date: 12/19/2019.  However, Rx request for refills was sent to the provider as there are none remaining.     Medication will be delivered via UPS to the prescription address in Epic WAM.    Asanti Craigo D Jahleah Mariscal   [facility] Specialty Technician

## 2019-12-11 NOTE — Unmapped (Signed)
Per NP Martin-Velez no intervention for tacrolimus level of 2.4.  Will see what next set of routine labs show.

## 2019-12-12 MED ORDER — MYFORTIC 180 MG TABLET,DELAYED RELEASE
ORAL_TABLET | Freq: Two times a day (BID) | ORAL | 11 refills | 30.00000 days | Status: CP
Start: 2019-12-12 — End: 2020-12-11
  Filled 2019-12-18 – 2020-01-13 (×2): qty 120, 30d supply, fill #0

## 2019-12-12 NOTE — Unmapped (Signed)
Patient has requested a medication refill via EPIC

## 2019-12-15 DIAGNOSIS — Z79899 Other long term (current) drug therapy: Principal | ICD-10-CM

## 2019-12-15 DIAGNOSIS — Z940 Kidney transplant status: Principal | ICD-10-CM

## 2019-12-15 DIAGNOSIS — Z944 Liver transplant status: Principal | ICD-10-CM

## 2019-12-15 DIAGNOSIS — Z1159 Encounter for screening for other viral diseases: Principal | ICD-10-CM

## 2019-12-15 DIAGNOSIS — Z9189 Other specified personal risk factors, not elsewhere classified: Principal | ICD-10-CM

## 2019-12-15 NOTE — Unmapped (Addendum)
Received call form patient asking about her recent lab results.  Let her know labs looked good but her tacrolimus level was a but low but will recheck with routine labs. Confirmed Monday 12/22/2019 appointment.   Placed liver portion of transplant lab orders in preporation of 12/22/2019 appointment.     Message kidney transplant coordinator Leonides Schanz and she agreed to place needed kidney transplant labs and to coordinate follow up with transplant neprhology.     Confirmed COVID-19 vaccination is recommended and encouraged and patient to only get the [employer]  Informed about [email or web address].

## 2019-12-18 MED FILL — MYFORTIC 180 MG TABLET,DELAYED RELEASE: 30 days supply | Qty: 120 | Fill #0 | Status: AC

## 2019-12-19 DIAGNOSIS — Z944 Liver transplant status: Principal | ICD-10-CM

## 2019-12-19 DIAGNOSIS — Z79899 Other long term (current) drug therapy: Principal | ICD-10-CM

## 2019-12-19 MED ORDER — MYFORTIC 180 MG TABLET,DELAYED RELEASE
ORAL_TABLET | Freq: Two times a day (BID) | ORAL | 11 refills | 30 days | Status: CP
Start: 2019-12-19 — End: 2020-12-18

## 2019-12-19 NOTE — Unmapped (Signed)
FOLLOW UP ANNUAL LIVER CLINIC NOTE     Patient Name: Michelle Travis  Medical Record Number: [id number]  Date of Service: 12/22/2019    Referring Physician: Benetta Spar   Current complaint: Follow up Annual Liver    Assessment/Plan:     Michelle Travis is a 64 y.o. 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  Recent testing/images stable - will trend LFTs and repeat US if any increase of concern based on flows today. Patient has been taking 2mg  daily of Envarsus and 360mg  BID of Myfortic; immunosuppressive levels were within goal range (4-6).       Cleared from a transplant perspective for orthopedic surgery, she requested a referral to [facility] ortho for potential need for knee replacement. Otherwise continue following with local pain management.     Encouraged weight loss and exercise.     HEALTH MAINTENANCE:   - Dermatology: This patient is at increased risk for the development of skin cancers due to immunosuppressant medications. We recommend yearly surveillance.She was reminded to establish  - Mammogram: annually  - Pap smear: Until age 13, we recommend routine surveillance.   - Colonoscopy: Until age 59, we recommend routine surveillance.   - Dental: no issues  - Mental health: no issues  - social high risk donor, monitoring per protocol  Immunization History   Administered Date(s) Administered   ??? DTaP / Hep B / IPV (Pediarix) 05/01/2016, 06/01/2016   ??? Hepatitis B, Adult 02/29/2016, 11/25/2016   ??? INFLUENZA TIV (TRI) 24MO+ W/ PRESERV (IM) 07/16/2012, 07/06/2014   ??? Influenza Vaccine Quad (IIV4 PF) 43mo+ injectable 09/25/2015   ??? Influenza Virus Vaccine, unspecified formulation 07/16/2017   ??? PNEUMOCOCCAL POLYSACCHARIDE 23 09/25/2015   ??? PPD Test 11/16/2016   ??? Pneumococcal Conjugate 13-Valent 09/09/2018   ??? SHINGRIX-ZOSTER VACCINE (HZV), RECOMBINANT,SUB-UNIT,ADJUVANTED IM 09/09/2018, 12/22/2019   ??? TdaP 06/13/2016 ??? ZOSTAVAX - ZOSTER VACCINE, LIVE, SQ 06/20/2016     Shingrix #2 completed today.     Reiterated importance of receiving COVID vaccine.    Reeducated patient on the importance of preventing illness, infection, and opportunistic cancers and the rationale behind vaccines and routine imaging, given his status as an immunocompromised person.     Discouraged use of any additional supplements/vitamins recommended to her from a local provider to boost her immune system. A daily multivitamin and vit D supplement for bone health is satisfactory and safe.     Return to clinic: 1 year  Labs: monthly    I personally spent 25 minutes face-to-face and non-face-to-face in the care of this patient, which includes all pre, intra, and post visit time on the date of service. Greater than 50% of the time was spent on counseling and the substance of the discussion.    Subjective:     HPI: Michelle Travis is a 64 y.o. 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 She has had few, overall, changes to health since previous visit.     Denies fever, chills, arthralgias, weight loss/gain, and fatigue. Denies chest pain, SOB, N/V/D, constipation or abdominal pain. No acute complaint today.    Past Medical History:   Diagnosis Date   ??? Acute renal failure (ARF) (CMS-HCC)    ??? Acute renal injury (CMS-HCC) 09/24/2015   ??? Anemia 04/21/2015   ??? Ascites due to alcoholic cirrhosis (CMS-HCC)    ??? Atelectasis 08/26/2016   ???  Brain aneurysm    ??? Breast cancer (CMS-HCC)    ??? Cirrhosis, alcoholic (CMS-HCC)    ??? Degenerative arthritis    ??? Esophageal varices (CMS-HCC)    ??? H/o ETOH abuse 06/19/2016   ??? Hepatic encephalopathy (CMS-HCC)    ??? History of cholecystectomy 08/25/2016   ??? Hypertension    ??? Leukocytosis 10/31/2016   ??? Lower extremity edema    ??? 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 Pleural effusion associated with hepatic disorder 08/26/2016   ??? Portal hypertension (CMS-HCC)    ??? SOB (shortness of breath) 06/19/2016   ??? Stage III chronic kidney disease 07/03/2015       Past Surgical History:   Procedure Laterality Date   ??? BREAST LUMPECTOMY  ~2000   ??? CATARACT EXTRACTION     ??? CEREBRAL ANEURYSM REPAIR     ??? CHOLECYSTECTOMY  2010   ??? PR TRANSPLANT LIVER,ALLOTRANSPLANT N/A 09/13/2017    Procedure: LIVER ALLOTRANSPLANTATION; ORTHOTOPIC, PARTIAL OR WHOLE, FROM CADAVER OR LIVING DONOR, ANY AGE;  Surgeon: Florene Glen, MD;  Location: MAIN OR [facility];  Service: Transplant   ??? PR TRANSPLANTATION OF KIDNEY N/A 09/13/2017    Procedure: RENAL ALLOTRANSPLANTATION, IMPLANTATION OF GRAFT; WITHOUT RECIPIENT NEPHRECTOMY;  Surgeon: Florene Glen, MD;  Location: MAIN OR [facility];  Service: Transplant   ??? right knee replacement     ??? TIPS PROCEDURE  07/16/2015       Family History   Problem Relation Age of Onset   ??? Aneurysm Mother    ??? Alcohol abuse Father    ??? Cirrhosis Father    ??? Alcohol abuse Sister        Social History     Socioeconomic History   ??? Marital status: Widowed     Spouse name: Not on file   ??? Number of children: Not on file   ??? Years of education: Not on file   ??? Highest education level: Not on file   Occupational History   ??? Not on file   Social Needs   ??? Financial resource strain: Not on file   ??? Food insecurity     Worry: Not on file     Inability: Not on file   ??? Transportation needs     Medical: Not on file     Non-medical: Not on file   Tobacco Use   ??? Smoking status: Never Smoker   ??? Smokeless tobacco: Never Used   Substance and Sexual Activity   ??? Alcohol use: No     Alcohol/week: 0.0 standard drinks   ??? Drug use: No   ??? Sexual activity: Not on file   Lifestyle   ??? Physical activity     Days per week: Not on file     Minutes per session: Not on file   ??? Stress: Not on file   Relationships   ??? Social Wellsite geologist on phone: Not on file     Gets together: Not on file     Attends religious service: Not on file     Active member of club or organization: Not on file     Attends meetings of clubs or organizations: Not on file     Relationship status: Not on file   Other Topics Concern   ??? Not on file   Social History Narrative    Previously drank 3 x 16 oz beers daily and a fifth of vodka per week.  Sober since  03/2015.  Lives with her husband, who has double BKAs and has had a renal transplant.  He helps make sure she takes her lactulose and also does some of the cooking.  Daughter lives nearby and is other main social support.  Multiple other family members close by.         REVIEW OF SYSTEMS:   The balance of 10/12 systems is negative with the exception of HPI.    Objective:     MEDICATIONS:  Allergies   Allergen Reactions   ??? Latex Swelling and Rash     Discoloration of skin.   Discoloration of skin.    ??? Hydrocodone Itching   ??? Hydrocodone-Acetaminophen Itching       Current Outpatient Medications   Medication Sig Dispense Refill   ??? pantoprazole (PROTONIX) 40 MG tablet Take 1 tablet (40 mg total) by mouth daily. 30 tablet 0   ??? aspirin (ECOTRIN) 81 MG tablet Take 1 tablet (81 mg total) by mouth daily. 30 tablet 11   ??? atorvastatin (LIPITOR) 10 MG tablet Take 1 tablet (10 mg total) by mouth daily. 30 tablet 8   ??? BELBUCA 300 mcg buccal film Apply 1 each to cheek daily.     ??? cholecalciferol, vitamin D3, 1,000 unit (25 mcg) tablet Take 1,000 Units by mouth daily.     ??? ENVARSUS XR 1 mg Tb24 extended release tablet Take 2 mg by mouth daily. 180 tablet 3   ??? MYFORTIC 180 mg EC tablet Take 2 tablets (360 mg total) by mouth two (2) times a day. 120 tablet 11   ??? oxyCODONE (OXY-IR) 5 mg capsule Take 5 mg by mouth every six (6) hours as needed for pain.       No current facility-administered medications for this visit.      PHYSICAL EXAM:  BP 136/87  - Pulse 65  - Temp 36.9 ??C (98.4 ??F) (Tympanic)  - Ht 154.9 cm (5' 1)  - Wt 87.1 kg (192 lb)  - LMP  (LMP Unknown)  - BMI 36.28 kg/m??     General Appearance:  NAD, well appearing and well nourished.   HEENT:  [place]/AT. Well hydrated moist mucous membranes of the oral cavity. No scleral icterus. No cervical lymphadenopathy.   Pulmonary:    Normal respiratory effort. CTAB, without wheezes/crackles/rhonchi. Good air movement.    Cardiovascular:  Regular rate and rhythm, no murmur noted.   Extremities No edema.    Abdomen:   Normoactive bowel sounds, abdomen soft, non-tender and not distended, no Hepatosplenomegaly or masses. Abdominal scar well healed without hernia.    Musculoskeletal: No joint tenderness, full ROM. Normal gait.    Skin: Skin color, texture, turgor normal, no rashes or lesions.   Neurologic: Grossly intact.   Psychiatric: Judgement and insight appropriate.        LAB RESULTS Personally reviewed  All lab results last 24 hours:    Recent Results (from the past 48 hour(s))   Gamma GT    Collection Time: 12/22/19  8:09 AM   Result Value Ref Range    GGT 54 (H) 11 - 48 U/L   Magnesium Level    Collection Time: 12/22/19  8:09 AM   Result Value Ref Range    Magnesium 1.6 1.6 - 2.2 mg/dL   Phosphorus Level    Collection Time: 12/22/19  8:09 AM   Result Value Ref Range    Phosphorus 3.2 2.9 - 4.7 mg/dL   Bilirubin, Direct  Collection Time: 12/22/19  8:09 AM   Result Value Ref Range    Bilirubin, Direct 0.10 0.00 - 0.40 mg/dL   Comprehensive Metabolic Panel    Collection Time: 12/22/19  8:09 AM   Result Value Ref Range    Sodium 140 135 - 145 mmol/L    Potassium 4.5 3.5 - 5.0 mmol/L    Chloride 104 98 - 107 mmol/L    Anion Gap 5 (L) 7 - 15 mmol/L    CO2 31.0 (H) 22.0 - 30.0 mmol/L    BUN 14 7 - 21 mg/dL    Creatinine 1.61 (L) 0.60 - 1.00 mg/dL    BUN/Creatinine Ratio 25     EGFR CKD-EPI Non-African American, Female >90 >=60 mL/min/1.53m2    EGFR CKD-EPI African American, Female >90 >=60 mL/min/1.82m2    Glucose 100 (H) 70 - 99 mg/dL    Calcium 9.4 8.5 - 09.6 mg/dL    Albumin 4.2 3.5 - 5.0 g/dL    Total Protein 7.1 6.5 - 8.3 g/dL    Total Bilirubin 0.5 0.0 - 1.2 mg/dL    AST 25 14 - 38 U/L    ALT 21 <35 U/L    Alkaline Phosphatase 81 38 - 126 U/L   CBC w/ Differential    Collection Time: 12/22/19  8:09 AM   Result Value Ref Range    WBC 6.1 4.5 - 11.0 10*9/L    RBC 4.76 4.00 - 5.20 10*12/L    HGB 12.7 12.0 - 16.0 g/dL    HCT 04.5 40.9 - 81.1 %    MCV 85.0 80.0 - 100.0 fL    MCH 26.6 26.0 - 34.0 pg    MCHC 31.3 31.0 - 37.0 g/dL    RDW 91.4 78.2 - 95.6 %    MPV 9.1 7.0 - 10.0 fL    Platelet 343 150 - 440 10*9/L    Neutrophils % 50.4 %    Lymphocytes % 36.5 %    Monocytes % 6.5 %    Eosinophils % 3.6 %    Basophils % 0.8 %    Absolute Neutrophils 3.1 2.0 - 7.5 10*9/L    Absolute Lymphocytes 2.2 1.5 - 5.0 10*9/L    Absolute Monocytes 0.4 0.2 - 0.8 10*9/L    Absolute Eosinophils 0.2 0.0 - 0.4 10*9/L    Absolute Basophils 0.1 0.0 - 0.1 10*9/L    Large Unstained Cells 2 0 - 4 %    Hypochromasia Marked (A) Not Present   Morphology Review    Collection Time: 12/22/19  8:09 AM   Result Value Ref Range    Smear Review Comments See Comment (A) Undefined         IMAGING Personally reviewed   US Renal Transplant W Doppler  Result Date: 12/22/2019  -Similar to slightly decreased resistive indices in the renal transplant arteries, within normal limits.   -Interval decrease in velocity at the main renal artery anastomosis.     US Liver Transplant  Result Date: 12/22/2019  -- Patent hepatic transplant vasculature.   -- Minimally elevated resistive index in the right hepatic artery, increased from prior. Otherwise stable resistive indices in the hepatic transplant arteries, within normal limits.   --Decreased velocities within the main portal vein anastomosis and post anastomosis sites, now sluggish. Attention on follow-up.     _______________________________________________        Gertie Fey, DNP, APRN, FNP-C  [facility]  [facility]  [address]  [place],  [place]  [address]

## 2019-12-19 NOTE — Unmapped (Signed)
Called patient to prep in person clinic visit for Monday 12/22/2019 with NP Martin-Velez. Patient denies changes in primary care provider, lab and pharmacy.  Patient Denies use of alcohol, tobacco and controlled substances.  Patient currentlyon disability.   Patient denies nausea, vomiting, diarrhea, constipation, night sweats,  fevers, chills and shortness of breath, change in taste smell, or other COVID-19 symptoms.      Reviewed current medication list.  Confirmed she gets Envarsus from [employer] directly.  Myfortic is from [facility] sent e-script for Myfortic.  Will wait to send refill for Envarsus once renewal for manufacturing assistance is due.      Needs double knee replacement and nervous to proceed with it.  Requires a cane for ambulation.  When in house able to ambulate without the cane unless getting up out of bed.      PCP recommended list of vitamins to take daily to prevent COVID-19.  Patient stated on the list was Vitamin D and Vitamin C.  Let her know Vit D safe; Vit C not so much because it is a immune booster.  She could not recall the other OTC supplements on this list stated she would bring it to clinic on Monday.  I let her know COVID-19 does not seem to have such severe results on IS med taking patients.  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     Received flu vaccine at PCP Fall 2020.  Shingrix second dose seems to be missing denies getting locally.  Messaged NP martin-Velez to make aware.      Health maintenance reviewed with patient:  Mammogram-completed last year   PAP smear- done last month   5 years left until next colonoscopy is due   Has not seen a dermatologist   Currently 190 poounds.  Declined nutritionist referral at this time.  Educated about wearing long sleves, hat, and using sunblock to prevent skin cancer. Educated to visit dermatologist yearly. Educated about how to identify possible skin cancer lesions. Educated about the increased risk of cancer due to immunosuppression medication and the benefits in routine cancer screenings.

## 2019-12-22 ENCOUNTER — Ambulatory Visit: Admit: 2019-12-22 | Discharge: 2019-12-22 | Payer: MEDICARE

## 2019-12-22 DIAGNOSIS — G8929 Other chronic pain: Secondary | ICD-10-CM

## 2019-12-22 DIAGNOSIS — Z23 Encounter for immunization: Principal | ICD-10-CM

## 2019-12-22 DIAGNOSIS — M25569 Pain in unspecified knee: Principal | ICD-10-CM

## 2019-12-22 DIAGNOSIS — Z940 Kidney transplant status: Principal | ICD-10-CM

## 2019-12-22 DIAGNOSIS — D899 Disorder involving the immune mechanism, unspecified: Principal | ICD-10-CM

## 2019-12-22 DIAGNOSIS — Z944 Liver transplant status: Principal | ICD-10-CM

## 2019-12-22 LAB — CBC W/ AUTO DIFF
BASOPHILS ABSOLUTE COUNT: 0.1 10*9/L (ref 0.0–0.1)
BASOPHILS RELATIVE PERCENT: 0.8 %
EOSINOPHILS ABSOLUTE COUNT: 0.2 10*9/L (ref 0.0–0.4)
EOSINOPHILS RELATIVE PERCENT: 3.6 %
HEMATOCRIT: 40.4 % (ref 36.0–46.0)
HEMOGLOBIN: 12.7 g/dL (ref 12.0–16.0)
LARGE UNSTAINED CELLS: 2 % (ref 0–4)
LYMPHOCYTES ABSOLUTE COUNT: 2.2 10*9/L (ref 1.5–5.0)
LYMPHOCYTES RELATIVE PERCENT: 36.5 %
MEAN CORPUSCULAR HEMOGLOBIN CONC: 31.3 g/dL (ref 31.0–37.0)
MEAN CORPUSCULAR HEMOGLOBIN: 26.6 pg (ref 26.0–34.0)
MEAN CORPUSCULAR VOLUME: 85 fL (ref 80.0–100.0)
MEAN PLATELET VOLUME: 9.1 fL (ref 7.0–10.0)
MONOCYTES ABSOLUTE COUNT: 0.4 10*9/L (ref 0.2–0.8)
NEUTROPHILS ABSOLUTE COUNT: 3.1 10*9/L (ref 2.0–7.5)
NEUTROPHILS RELATIVE PERCENT: 50.4 %
PLATELET COUNT: 343 10*9/L (ref 150–440)
RED CELL DISTRIBUTION WIDTH: 14.9 % (ref 12.0–15.0)
WBC ADJUSTED: 6.1 10*9/L (ref 4.5–11.0)

## 2019-12-22 LAB — COMPREHENSIVE METABOLIC PANEL
ALBUMIN: 4.2 g/dL (ref 3.5–5.0)
ALKALINE PHOSPHATASE: 81 U/L (ref 38–126)
ALT (SGPT): 21 U/L (ref <35)
ANION GAP: 5 mmol/L — ABNORMAL LOW (ref 7–15)
BILIRUBIN TOTAL: 0.5 mg/dL (ref 0.0–1.2)
BLOOD UREA NITROGEN: 14 mg/dL (ref 7–21)
BUN / CREAT RATIO: 25
CALCIUM: 9.4 mg/dL (ref 8.5–10.2)
CHLORIDE: 104 mmol/L (ref 98–107)
CO2: 31 mmol/L — ABNORMAL HIGH (ref 22.0–30.0)
CREATININE: 0.56 mg/dL — ABNORMAL LOW (ref 0.60–1.00)
EGFR CKD-EPI AA FEMALE: >90 mL/min/{1.73_m2} (ref >=60)
EGFR CKD-EPI NON-AA FEMALE: >90 mL/min/{1.73_m2} (ref >=60)
GLUCOSE RANDOM: 100 mg/dL — ABNORMAL HIGH (ref 70–99)
POTASSIUM: 4.5 mmol/L (ref 3.5–5.0)
PROTEIN TOTAL: 7.1 g/dL (ref 6.5–8.3)

## 2019-12-22 LAB — TACROLIMUS, TROUGH: Lab: 3 — ABNORMAL LOW

## 2019-12-22 LAB — BILIRUBIN DIRECT: Bilirubin.glucuronidated+Bilirubin.albumin bound:MCnc:Pt:Ser/Plas:Qn:: 0.1

## 2019-12-22 LAB — GAMMA GLUTAMYL TRANSFERASE: Gamma glutamyl transferase:CCnc:Pt:Ser/Plas:Qn:: 54 — ABNORMAL HIGH

## 2019-12-22 LAB — SMEAR REVIEW

## 2019-12-22 LAB — PHOSPHORUS: Phosphate:MCnc:Pt:Ser/Plas:Qn:: 3.2

## 2019-12-22 LAB — GLUCOSE RANDOM: Glucose:MCnc:Pt:Ser/Plas:Qn:: 100 — ABNORMAL HIGH

## 2019-12-22 LAB — MAGNESIUM: Magnesium:MCnc:Pt:Ser/Plas:Qn:: 1.6

## 2019-12-22 LAB — HEMATOCRIT: Hematocrit:VFr:Pt:Bld:Qn:: 40.4

## 2019-12-24 LAB — HCV RNA(IU): Hepatitis C virus RNA:ACnc:Pt:Ser/Plas:Qn:Probe.amp.tar: 0

## 2019-12-24 LAB — HEPATITIS C RNA, QUANTITATIVE, PCR: HCV RNA: NOT DETECTED

## 2019-12-30 DIAGNOSIS — Z944 Liver transplant status: Principal | ICD-10-CM

## 2019-12-30 DIAGNOSIS — Z79899 Other long term (current) drug therapy: Principal | ICD-10-CM

## 2019-12-30 DIAGNOSIS — K769 Liver disease, unspecified: Principal | ICD-10-CM

## 2020-01-02 NOTE — Unmapped (Signed)
Spoke with patient about ortho surgery referral.  Stated she is not currently able to have surgery since she has so much going on.  Left V per her request on her personal cell phone with their number [phone].  That way she can call when she is ready.

## 2020-01-07 NOTE — Unmapped (Signed)
[facility] Refill Coordination Note    Specialty Medication(s) to be Shipped:   Transplant: Myfortic 180mg     Other medication(s) to be shipped: none     Michelle Travis, DOB: 09-Mar-1956  Phone: [phone] (home)       All above HIPAA information was verified with patient.     Was a Nurse, learning disability used for this call? No    Completed refill call assessment today to schedule patient's medication shipment from the [facility] [phone]).       Specialty medication(s) and dose(s) confirmed: Regimen is correct and unchanged.   Changes to medications: Kailoni reports no changes at this time.  Changes to insurance: No  Questions for the pharmacist: No    Confirmed patient received Welcome Packet with first shipment. The patient will receive a drug information handout for each medication shipped and additional FDA Medication Guides as required.       DISEASE/MEDICATION-SPECIFIC INFORMATION        N/A    SPECIALTY MEDICATION ADHERENCE     Medication Adherence    Patient reported X missed doses in the last month: 0  Adherence tools used: patient uses a pill box to manage medications            Myfortic 180mg : 10 days worth of medication on hand.        SHIPPING     Shipping address confirmed in Epic.     Delivery Scheduled: Yes, Expected medication delivery date: 01/14/20.     Medication will be delivered via UPS to the prescription address in Epic WAM.    [place] A Rosslyn Pasion   [facility] Specialty Technician

## 2020-01-13 MED FILL — MYFORTIC 180 MG TABLET,DELAYED RELEASE: 30 days supply | Qty: 120 | Fill #0 | Status: AC

## 2020-01-14 LAB — CBC W/ DIFFERENTIAL
BANDED NEUTROPHILS ABSOLUTE COUNT: 0 10*3/uL (ref 0.0–0.1)
BASOPHILS ABSOLUTE COUNT: 0 10*3/uL (ref 0.0–0.2)
BASOPHILS RELATIVE PERCENT: 1 %
HEMATOCRIT: 39.7 % (ref 34.0–46.6)
IMMATURE GRANULOCYTES: 0 %
LYMPHOCYTES ABSOLUTE COUNT: 1.9 10*3/uL (ref 0.7–3.1)
LYMPHOCYTES RELATIVE PERCENT: 35 %
MEAN CORPUSCULAR HEMOGLOBIN CONC: 32.2 g/dL (ref 31.5–35.7)
MEAN CORPUSCULAR HEMOGLOBIN: 27.1 pg (ref 26.6–33.0)
MEAN CORPUSCULAR VOLUME: 84 fL (ref 79–97)
MONOCYTES ABSOLUTE COUNT: 0.5 10*3/uL (ref 0.1–0.9)
MONOCYTES RELATIVE PERCENT: 10 %
NEUTROPHILS ABSOLUTE COUNT: 2.7 10*3/uL (ref 1.4–7.0)
NEUTROPHILS RELATIVE PERCENT: 51 %
PLATELET COUNT: 353 10*3/uL (ref 150–450)
RED BLOOD CELL COUNT: 4.73 x10E6/uL (ref 3.77–5.28)
RED CELL DISTRIBUTION WIDTH: 14.3 % (ref 11.7–15.4)
WHITE BLOOD CELL COUNT: 5.3 10*3/uL (ref 3.4–10.8)

## 2020-01-14 LAB — COMPREHENSIVE METABOLIC PANEL
A/G RATIO: 1.6 (ref 1.2–2.2)
ALBUMIN: 4.2 g/dL (ref 3.8–4.8)
ALKALINE PHOSPHATASE: 83 IU/L (ref 39–117)
ALT (SGPT): 22 IU/L (ref 0–32)
BILIRUBIN TOTAL: 0.4 mg/dL (ref 0.0–1.2)
BLOOD UREA NITROGEN: 13 mg/dL (ref 8–27)
BUN / CREAT RATIO: 19 (ref 12–28)
CALCIUM: 9.4 mg/dL (ref 8.7–10.3)
CHLORIDE: 103 mmol/L (ref 96–106)
CO2: 24 mmol/L (ref 20–29)
GLOBULIN, TOTAL: 2.7 g/dL (ref 1.5–4.5)
GLUCOSE: 92 mg/dL (ref 65–99)
POTASSIUM: 5 mmol/L (ref 3.5–5.2)
SODIUM: 140 mmol/L (ref 134–144)
TOTAL PROTEIN: 6.9 g/dL (ref 6.0–8.5)

## 2020-01-14 LAB — LYMPHOCYTES ABSOLUTE COUNT: Lymphocytes:NCnc:Pt:Bld:Qn:Automated count: 1.9

## 2020-01-14 LAB — GAMMA GLUTAMYL TRANSFERASE: Gamma glutamyl transferase:CCnc:Pt:Ser/Plas:Qn:: 64 — ABNORMAL HIGH

## 2020-01-14 LAB — MAGNESIUM: Magnesium:MCnc:Pt:Ser/Plas:Qn:: 1.7

## 2020-01-14 LAB — BILIRUBIN DIRECT: Bilirubin.glucuronidated+Bilirubin.albumin bound:MCnc:Pt:Ser/Plas:Qn:: 0.12

## 2020-01-14 LAB — PHOSPHORUS, SERUM: Phosphate:MCnc:Pt:Ser/Plas:Qn:: 2.6 — ABNORMAL LOW

## 2020-01-14 LAB — BUN / CREAT RATIO: Urea nitrogen/Creatinine:MRto:Pt:Ser/Plas:Qn:: 19

## 2020-01-15 LAB — TACROLIMUS BLOOD: Tacrolimus:MCnc:Pt:Bld:Qn:LC/MS/MS: 3.6

## 2020-02-09 NOTE — Unmapped (Signed)
[facility] Refill Coordination Note    Specialty Medication(s) to be Shipped:   Transplant: Myfortic 180mg     Other medication(s) to be shipped: N/A     Michelle Travis, DOB: 12-08-1955  Phone: [phone] (home)       All above HIPAA information was verified with patient.     Was a Nurse, learning disability used for this call? No    Completed refill call assessment today to schedule patient's medication shipment from the [facility] [phone]).       Specialty medication(s) and dose(s) confirmed: Regimen is correct and unchanged.   Changes to medications: Donnah reports no changes at this time.  Changes to insurance: No  Questions for the pharmacist: No    Confirmed patient received Welcome Packet with first shipment. The patient will receive a drug information handout for each medication shipped and additional FDA Medication Guides as required.       DISEASE/MEDICATION-SPECIFIC INFORMATION        N/A    SPECIALTY MEDICATION ADHERENCE     Medication Adherence    Patient reported X missed doses in the last month: 0  Specialty Medication: Myfortic 180mg   Patient is on additional specialty medications: No  Adherence tools used: patient uses a pill box to manage medications          Myfortic 180 mg: 7 days of medicine on hand     SHIPPING     Shipping address confirmed in Epic.     Delivery Scheduled: Yes, Expected medication delivery date: 02/13/2020.     Medication will be delivered via UPS to the prescription address in Epic WAM.    [name] [facility] Specialty Technician

## 2020-02-12 MED FILL — MYFORTIC 180 MG TABLET,DELAYED RELEASE: ORAL | 30 days supply | Qty: 120 | Fill #1

## 2020-02-12 MED FILL — MYFORTIC 180 MG TABLET,DELAYED RELEASE: 30 days supply | Qty: 120 | Fill #1 | Status: AC

## 2020-03-02 DIAGNOSIS — Z79899 Other long term (current) drug therapy: Principal | ICD-10-CM

## 2020-03-02 DIAGNOSIS — Z944 Liver transplant status: Principal | ICD-10-CM

## 2020-03-02 MED ORDER — ENVARSUS XR 1 MG TABLET,EXTENDED RELEASE
ORAL_TABLET | Freq: Every day | ORAL | 3 refills | 90 days | Status: CP
Start: 2020-03-02 — End: 2021-03-02

## 2020-03-02 NOTE — Unmapped (Signed)
Per test claim for Envarsus at the [facility], patient needs Medication Assistance Program for Prior Authorization.

## 2020-03-02 NOTE — Unmapped (Signed)
[facility] Clinical Assessment & Refill Coordination Note    Michelle Travis, DOB: [birth year]  Phone: [phone] (home)     All above HIPAA information was verified with patient.     Was a Nurse, learning disability used for this call? No    Specialty Medication(s):   Transplant: Myfortic 180mg    envarsus if from mfg     Current Outpatient Medications   Medication Sig Dispense Refill   ??? aspirin (ECOTRIN) 81 MG tablet Take 1 tablet (81 mg total) by mouth daily. 30 tablet 11   ??? atorvastatin (LIPITOR) 10 MG tablet Take 1 tablet (10 mg total) by mouth daily. 30 tablet 8   ??? BELBUCA 300 mcg buccal film Apply 1 each to cheek daily.     ??? cholecalciferol, vitamin D3, 1,000 unit (25 mcg) tablet Take 1,000 Units by mouth daily.     ??? ENVARSUS XR 1 mg Tb24 extended release tablet Take 2 mg by mouth daily. 180 tablet 3   ??? MYFORTIC 180 mg EC tablet Take 2 tablets (360 mg total) by mouth two (2) times a day. 120 tablet 11   ??? oxyCODONE (OXY-IR) 5 mg capsule Take 5 mg by mouth every six (6) hours as needed for pain.     ??? pantoprazole (PROTONIX) 40 MG tablet Take 1 tablet (40 mg total) by mouth daily. 30 tablet 0     No current facility-administered medications for this visit.        Changes to medications: [name] reports no changes at this time.    Allergies   Allergen Reactions   ??? Latex Swelling and Rash     Discoloration of skin.   Discoloration of skin.    ??? Hydrocodone Itching   ??? Hydrocodone-Acetaminophen Itching       Changes to allergies: No    SPECIALTY MEDICATION ADHERENCE     Myfortic 180mg   : 10 days of medicine on hand     Medication Adherence    Patient reported X missed doses in the last month: 0  Specialty Medication: myfortic 180mg   Adherence tools used: patient uses a pill box to manage medications          Specialty medication(s) dose(s) confirmed: Regimen is correct and unchanged.     Are there any concerns with adherence? No    Adherence counseling provided? Not needed    CLINICAL MANAGEMENT AND INTERVENTION      Clinical Benefit Assessment:    Do you feel the medicine is effective or helping your condition? Yes    Clinical Benefit counseling provided? Not needed    Adverse Effects Assessment:    Are you experiencing any side effects? No    Are you experiencing difficulty administering your medicine? No    Quality of Life Assessment:    How many days over the past month did your transplant  keep you from your normal activities? For example, brushing your teeth or getting up in the morning. 0    Have you discussed this with your provider? Not needed    Therapy Appropriateness:    Is therapy appropriate? Yes, therapy is appropriate and should be continued    DISEASE/MEDICATION-SPECIFIC INFORMATION      N/A    PATIENT SPECIFIC NEEDS     - Does the patient have any physical, cognitive, or cultural barriers? No    - Is the patient high risk? Yes, patient is taking a REMS drug. Medication is dispensed in compliance with REMS  program.     - Does the patient require a Care Management Plan? No     - Does the patient require physician intervention or other additional services (i.e. nutrition, smoking cessation, social work)? No      SHIPPING     Specialty Medication(s) to be Shipped:   Transplant: Myfortic 180mg     Other medication(s) to be shipped: na     Changes to insurance: No    Delivery Scheduled: Yes, Expected medication delivery date: 03/10/2020.     Medication will be delivered via UPS to the confirmed prescription address in [facility].    The patient will receive a drug information handout for each medication shipped and additional FDA Medication Guides as required.  Verified that patient has previously received a Conservation officer, historic buildings.    All of the patient's questions and concerns have been addressed.    [name]   [facility] Specialty Pharmacist

## 2020-03-09 MED FILL — MYFORTIC 180 MG TABLET,DELAYED RELEASE: 30 days supply | Qty: 120 | Fill #2 | Status: AC

## 2020-03-09 MED FILL — MYFORTIC 180 MG TABLET,DELAYED RELEASE: ORAL | 30 days supply | Qty: 120 | Fill #2

## 2020-03-16 NOTE — Unmapped (Signed)
Patient returned call, she says she'll keep the 5/21 appointment with Dr. Margaretmary Bayley. Provided address via phone and noted her daughter will need to come inside with her.

## 2020-03-16 NOTE — Unmapped (Signed)
Patient called to reschedule 5/21 appointment with Dr. Margaretmary Bayley. She needs to check w/transportation to see if she can come on 6/4 @ 2:00pm. She will call back to confirm.

## 2020-03-17 DIAGNOSIS — K769 Liver disease, unspecified: Principal | ICD-10-CM

## 2020-03-17 DIAGNOSIS — Z940 Kidney transplant status: Principal | ICD-10-CM

## 2020-03-17 DIAGNOSIS — Z944 Liver transplant status: Principal | ICD-10-CM

## 2020-03-17 DIAGNOSIS — Z79899 Other long term (current) drug therapy: Principal | ICD-10-CM

## 2020-03-17 LAB — COMPREHENSIVE METABOLIC PANEL
A/G RATIO: 1.8 (ref 1.2–2.2)
ALBUMIN: 4.5 g/dL (ref 3.8–4.8)
ALKALINE PHOSPHATASE: 83 IU/L (ref 48–121)
ALT (SGPT): 39 IU/L — ABNORMAL HIGH (ref 0–32)
AST (SGOT): 50 IU/L — ABNORMAL HIGH (ref 0–40)
BLOOD UREA NITROGEN: 12 mg/dL (ref 8–27)
BUN / CREAT RATIO: 15 (ref 12–28)
CALCIUM: 9.4 mg/dL (ref 8.7–10.3)
CHLORIDE: 104 mmol/L (ref 96–106)
CO2: 20 mmol/L (ref 20–29)
CREATININE: 0.79 mg/dL (ref 0.57–1.00)
GLOBULIN, TOTAL: 2.5 g/dL (ref 1.5–4.5)
GLUCOSE: 79 mg/dL (ref 65–99)
POTASSIUM: 4.5 mmol/L (ref 3.5–5.2)
SODIUM: 140 mmol/L (ref 134–144)
TOTAL PROTEIN: 7 g/dL (ref 6.0–8.5)

## 2020-03-17 LAB — CBC W/ DIFFERENTIAL
BANDED NEUTROPHILS ABSOLUTE COUNT: 0 10*3/uL (ref 0.0–0.1)
BASOPHILS RELATIVE PERCENT: 1 %
EOSINOPHILS RELATIVE PERCENT: 4 %
HEMATOCRIT: 42.1 % (ref 34.0–46.6)
HEMOGLOBIN: 13.2 g/dL (ref 11.1–15.9)
IMMATURE GRANULOCYTES: 0 %
LYMPHOCYTES ABSOLUTE COUNT: 1.9 10*3/uL (ref 0.7–3.1)
LYMPHOCYTES RELATIVE PERCENT: 31 %
MEAN CORPUSCULAR HEMOGLOBIN CONC: 31.4 g/dL — ABNORMAL LOW (ref 31.5–35.7)
MEAN CORPUSCULAR HEMOGLOBIN: 26.7 pg (ref 26.6–33.0)
MEAN CORPUSCULAR VOLUME: 85 fL (ref 79–97)
MONOCYTES ABSOLUTE COUNT: 0.9 10*3/uL (ref 0.1–0.9)
NEUTROPHILS ABSOLUTE COUNT: 3 10*3/uL (ref 1.4–7.0)
NEUTROPHILS RELATIVE PERCENT: 49 %
PLATELET COUNT: 350 10*3/uL (ref 150–450)
RED BLOOD CELL COUNT: 4.95 x10E6/uL (ref 3.77–5.28)
RED CELL DISTRIBUTION WIDTH: 14.5 % (ref 11.7–15.4)
WHITE BLOOD CELL COUNT: 6.2 10*3/uL (ref 3.4–10.8)

## 2020-03-17 LAB — PHOSPHORUS, SERUM: Phosphate:MCnc:Pt:Ser/Plas:Qn:: 2.4 — ABNORMAL LOW

## 2020-03-17 LAB — MAGNESIUM: Magnesium:MCnc:Pt:Ser/Plas:Qn:: 1.5 — ABNORMAL LOW

## 2020-03-17 LAB — CREATININE: Creatinine:MCnc:Pt:Ser/Plas:Qn:: 0.79

## 2020-03-17 LAB — TACROLIMUS BLOOD: Tacrolimus:MCnc:Pt:Bld:Qn:LC/MS/MS: 3.4

## 2020-03-17 LAB — BILIRUBIN DIRECT: Bilirubin.glucuronidated+Bilirubin.albumin bound:MCnc:Pt:Ser/Plas:Qn:: 0.11

## 2020-03-17 LAB — PHOSPHORUS: PHOSPHORUS, SERUM: 2.4 mg/dL — ABNORMAL LOW (ref 3.0–4.3)

## 2020-03-17 LAB — GAMMA GLUTAMYL TRANSFERASE: Gamma glutamyl transferase:CCnc:Pt:Ser/Plas:Qn:: 120 — ABNORMAL HIGH

## 2020-03-17 LAB — EOSINOPHILS ABSOLUTE COUNT: Eosinophils:NCnc:Pt:Bld:Qn:Automated count: 0.2

## 2020-03-17 NOTE — Unmapped (Signed)
[facility] corp orders placed

## 2020-03-18 DIAGNOSIS — Z79899 Other long term (current) drug therapy: Principal | ICD-10-CM

## 2020-03-18 DIAGNOSIS — E559 Vitamin D deficiency, unspecified: Principal | ICD-10-CM

## 2020-03-18 DIAGNOSIS — E119 Type 2 diabetes mellitus without complications: Principal | ICD-10-CM

## 2020-03-18 DIAGNOSIS — Z940 Kidney transplant status: Principal | ICD-10-CM

## 2020-03-19 ENCOUNTER — Ambulatory Visit: Admit: 2020-03-19 | Discharge: 2020-03-19 | Payer: MEDICARE | Attending: Nephrology | Primary: Nephrology

## 2020-03-19 ENCOUNTER — Ambulatory Visit: Admit: 2020-03-19 | Discharge: 2020-03-19 | Payer: MEDICARE

## 2020-03-19 DIAGNOSIS — D472 Monoclonal gammopathy: Principal | ICD-10-CM

## 2020-03-19 DIAGNOSIS — E559 Vitamin D deficiency, unspecified: Principal | ICD-10-CM

## 2020-03-19 DIAGNOSIS — Z79899 Other long term (current) drug therapy: Principal | ICD-10-CM

## 2020-03-19 DIAGNOSIS — Z940 Kidney transplant status: Principal | ICD-10-CM

## 2020-03-19 DIAGNOSIS — E119 Type 2 diabetes mellitus without complications: Principal | ICD-10-CM

## 2020-03-19 DIAGNOSIS — Z8639 Personal history of other endocrine, nutritional and metabolic disease: Principal | ICD-10-CM

## 2020-03-19 LAB — COMPREHENSIVE METABOLIC PANEL
ALBUMIN: 4.2 g/dL (ref 3.4–5.0)
ALKALINE PHOSPHATASE: 74 U/L (ref 46–116)
ALT (SGPT): 41 U/L (ref 10–49)
ANION GAP: 7 mmol/L (ref 3–11)
AST (SGOT): 36 U/L — ABNORMAL HIGH (ref <34)
BILIRUBIN TOTAL: 0.4 mg/dL (ref 0.3–1.2)
BLOOD UREA NITROGEN: 12 mg/dL (ref 9–23)
CALCIUM: 9.7 mg/dL (ref 8.7–10.4)
CHLORIDE: 107 mmol/L (ref 98–107)
CO2: 22.8 mmol/L (ref 20.0–31.0)
CREATININE: 0.68 mg/dL (ref 0.60–1.10)
EGFR CKD-EPI AA FEMALE: >90 mL/min/{1.73_m2}
EGFR CKD-EPI NON-AA FEMALE: >90 mL/min/{1.73_m2}
GLUCOSE RANDOM: 99 mg/dL (ref 70–179)
POTASSIUM: 3.9 mmol/L (ref 3.5–5.1)
PROTEIN TOTAL: 7.7 g/dL (ref 5.7–8.2)

## 2020-03-19 LAB — URINALYSIS
BLOOD UA: NEGATIVE
GLUCOSE UA: NEGATIVE
KETONES UA: NEGATIVE
LEUKOCYTE ESTERASE UA: NEGATIVE
NITRITE UA: NEGATIVE
PH UA: 6 (ref 5.0–9.0)
PROTEIN UA: 30 — AB
RBC UA: <1 /HPF (ref <4)
SPECIFIC GRAVITY UA: 1.02 (ref 1.005–1.030)
UROBILINOGEN UA: 0.2
WBC UA: 3 /HPF (ref 0–5)

## 2020-03-19 LAB — CBC W/ AUTO DIFF
BASOPHILS ABSOLUTE COUNT: 0.1 10*9/L (ref 0.0–0.1)
BASOPHILS RELATIVE PERCENT: 1.1 %
EOSINOPHILS ABSOLUTE COUNT: 0.2 10*9/L (ref 0.0–0.7)
HEMATOCRIT: 40.7 % (ref 35.0–44.0)
HEMOGLOBIN: 13.1 g/dL (ref 12.0–15.5)
LYMPHOCYTES ABSOLUTE COUNT: 2.6 10*9/L (ref 0.7–4.0)
LYMPHOCYTES RELATIVE PERCENT: 37.8 %
MEAN CORPUSCULAR HEMOGLOBIN CONC: 32.1 g/dL (ref 30.0–36.0)
MEAN CORPUSCULAR VOLUME: 82.8 fL (ref 82.0–98.0)
MEAN PLATELET VOLUME: 8.3 fL (ref 7.0–10.0)
MONOCYTES ABSOLUTE COUNT: 0.7 10*9/L (ref 0.1–1.0)
MONOCYTES RELATIVE PERCENT: 10.4 %
NEUTROPHILS ABSOLUTE COUNT: 3.3 10*9/L (ref 1.7–7.7)
NEUTROPHILS RELATIVE PERCENT: 48.5 %
NUCLEATED RED BLOOD CELLS: 0 /100{WBCs} (ref <=4)
PLATELET COUNT: 352 10*9/L (ref 150–450)
RED BLOOD CELL COUNT: 4.92 10*12/L (ref 3.90–5.03)
RED CELL DISTRIBUTION WIDTH: 15.3 % — ABNORMAL HIGH (ref 12.0–15.0)
WBC ADJUSTED: 6.9 10*9/L (ref 3.5–10.5)

## 2020-03-19 LAB — PHOSPHORUS: Phosphate:MCnc:Pt:Ser/Plas:Qn:: 2.7

## 2020-03-19 LAB — LIPID PANEL
CHOLESTEROL/HDL RATIO SCREEN: 4.4 (ref 1.0–4.5)
CHOLESTEROL: 124 mg/dL (ref <=200)
LDL CHOLESTEROL CALCULATED: 68 mg/dL (ref 40–100)
NON-HDL CHOLESTEROL: 96 mg/dL (ref 70–130)
VLDL CHOLESTEROL CAL: 27.6 mg/dL (ref 11–41)

## 2020-03-19 LAB — IMMUNOGLOBULIN FREE LT CHAINS BLOOD: LAMBDA FREE, SER: 1.35 mg/dL (ref 0.57–2.63)

## 2020-03-19 LAB — K/L FLC RATIO: Lab: 1.83 — ABNORMAL HIGH

## 2020-03-19 LAB — PARATHYROID HOMONE (PTH): CALCIUM: 9.5 mg/dL (ref 8.5–10.2)

## 2020-03-19 LAB — SPECIFIC GRAVITY UA: Specific gravity:Rden:Pt:Urine:Qn:: 1.02

## 2020-03-19 LAB — TRIGLYCERIDES: Triglyceride:MCnc:Pt:Ser/Plas:Qn:: 138

## 2020-03-19 LAB — HEMOGLOBIN: Hemoglobin:MCnc:Pt:Bld:Qn:: 13.1

## 2020-03-19 LAB — BILIRUBIN TOTAL: Bilirubin:MCnc:Pt:Ser/Plas:Qn:: 0.4

## 2020-03-19 LAB — PARATHYROID HORMONE INTACT: Parathyrin.intact:MCnc:Pt:Ser/Plas:Qn:: 103.5 — ABNORMAL HIGH

## 2020-03-19 LAB — MAGNESIUM: Magnesium:MCnc:Pt:Ser/Plas:Qn:: 1.7

## 2020-03-19 LAB — PROTEIN/CREAT RATIO, URINE: Protein/Creatinine:MRto:Pt:Urine:Qn:: 0.251

## 2020-03-19 LAB — THYROID STIMULATING HORMONE: Thyrotropin:ACnc:Pt:Ser/Plas:Qn:: 1.315

## 2020-03-19 LAB — PROTEIN / CREATININE RATIO, URINE: CREATININE, URINE: 241 mg/dL

## 2020-03-19 NOTE — Unmapped (Signed)
AOBP performed right arm, medium cuff.  Average - 103/76, p 64   1st reading - 105/80, p 65   2nd reading - 105/75, p 62    3rd reading- 99/74, p 64

## 2020-03-19 NOTE — Unmapped (Signed)
Patient labs drawn in room prior to leaving Nephrology clinic

## 2020-03-19 NOTE — Unmapped (Signed)
Transplant Nephrology Clinic Visit    Assessment and Plan  Michelle BUSHEE is a 64 y.o. 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 Active medical issues include:    1. Status post kidney transplant. Creatinine today is 0.68 mg/dL (baseline <1.6 mg/dL). UP/C today is 0.251. Her last dose of Rituximab was 03/2017 with no plans for repeat dosing unless recurrence is confirmed.     2. S/P Liver transplant with excellent liver function. She will continue to follow with the liver transplant team.    3. Immunosuppression. Tacrolimus level is 3.6 (target 3-6). Patient will continue Envarsus 2 mg daily, prednisone 5 mg daily, and Myfortic 360 mg BID.     4. History of IgG lambda monoclonal gammopathy. Will check SPEP with immunofixation and serum free light chains today.     5. History of sleep apnea. She is no longer on CPAP. She denies daytime drowsiness.     6. Recent diverticulitis. Symptoms have resolved.     7. Bilateral knee pain. Patient was reassured that if TKR is necessary her transplant status does not represent a contraindication to surgery.     8. Immunizations. Pneumococcal and Shingrix vaccines up-to-date. COVID-19 vaccine x 2 completed. She is aware of the limited efficacy of the vaccine.     9. Cancer screening. She is due for mammography. PAP smear last year was unremarkable. Colonoscopy 08/07/18 with single 6 mm polyp and diverticulosis.     10. Follow-up. Patient will return to clinic in 2 months.         History of Present Illness  Michelle Travis is a 64 y.o. 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  She has no history of rejection or recurrent disease. She has not had a kdiney biopsy since transplant.     She presents today stating she had a bout of diverticulitis earlier this week. Symptoms included abdominal pain and diarrhea. This resolved after she was prescribed Lomotil. She did not receive antibiotics.     She also complains of bilateral knee pain and is considering knee surgery. She denies nausea, vomiting, chest pain, shortness of breath, edema, headache, tremors, dysuria, allograft tenderness, fever or chills.      Transplant History:  1. 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 S/P kidney and liver transplants on 09/13/17 at [facility]. Kidney KDPI 26%. CMV D+/R+; EBV D-/R+, hep C Ab +/NAT- (recipient hep C Ab negative)  4. Complicated by rectus sheath hematoma  5. Delayed graft function requiring hemodialysis sessions following transplant (stopped dialysis 11/26/018).  6. Immunosuppression was ??? induction followed by tacrolimus and myfortic maintenance therapy.   7. Baseline creatinine <1.0 mg/dL.    Past Medical History:  1. Alcoholic cirrhosis, S/P TIPS  2. Membranous nephropathy, PLA2R positive biopsy 08/30/2016, treated solumedrol initially with little improvement then with Rituximab (10/2016 and 03/2017).   3. S/P kidney and liver transplants as stated above  4. History of breast cancer, in remission for 17 years, s/p lumpectomy.  5. History of IGG monoclonal gammopathy, IgG lambda. Kidney biopsy 08/30/2016 without MGRS.  6. S/p cerebral aneurysm repair.  7. S/P total knee replacement, right   8. Essential HTN prior to ESLD  9. History of diverticulitis  10. Recurrent pleural effusion, history of pneumonia x 3  11. Osteopenia by DEXA 06/14/2016  12.  Hypoattenuating thyroid nodule noted on chest CT 11/11/2016  13. Sleep apnea    Review of Systems    All other systems are reviewed and are negative.    Medications    Current Outpatient Medications   Medication Sig Dispense Refill   ??? atorvastatin (LIPITOR) 10 MG tablet Take 1 tablet (10 mg total) by mouth daily. 30 tablet 8   ??? BELBUCA 300 mcg buccal film Apply 1 each to cheek daily.     ??? cholecalciferol, vitamin D3, 1,000 unit (25 mcg) tablet Take 1,000 Units by mouth daily.     ??? ENVARSUS XR 1 mg Tb24 extended release tablet Take 2 tablets (2 mg total) by mouth daily. 180 tablet 3   ??? MYFORTIC 180 mg EC tablet Take 2 tablets (360 mg total) by mouth two (2) times a day. 120 tablet 11   ??? oxyCODONE (OXY-IR) 5 mg capsule Take 5 mg by mouth every six (6) hours as needed for pain.     ??? pantoprazole (PROTONIX) 40 MG tablet Take 1 tablet (40 mg total) by mouth daily. 30 tablet 0   ??? aspirin (ECOTRIN) 81 MG tablet Take 1 tablet (81 mg total) by mouth daily. 30 tablet 11     No current facility-administered medications for this visit.       Physical Exam    BP 103/76 (BP Site: R Arm, BP Position: Sitting, BP Cuff Size: Medium)  - Pulse 64  - Temp 35.6 ??C (96.1 ??F) (Temporal)  - Wt 84.3 kg (185 lb 12.8 oz)  - LMP  (LMP Unknown)  - BMI 35.11 kg/m??   General: Patient is a pleasant female in no apparent distress.  Eyes: Sclera anicteric.  Neck: Supple without LAD/JVD/bruits.  Lungs: Clear to auscultation bilaterally, no wheezes/rales/rhonchi.  Cardiovascular: Regular rate and rhythm without murmurs, rubs or gallops.  Abdomen: Soft, notender/nondistended. Positive bowel sounds. No hepatosplenomegaly, masses or bruits appreciated.   Extremities: Without edema, joints without evidence of synovitis  Skin: Without rash  Neurological: Grossly nonfocal.  Psychiatric: Mood and affect appropriate.    Laboratory Results    Recent Results (from the past 170 hour(s))   CBC w/ Differential    Collection Time: 03/16/20  9:53 AM   Result Value Ref Range    WBC 6.2 3.4 - 10.8 x10E3/uL    RBC 4.95 3.77 - 5.28 x10E6/uL    HGB 13.2 11.1 - 15.9 g/dL    HCT 16.1 09.6 - 04.5 %    MCV 85 79.0 - 97.0 fL    MCH 26.7 26.6 - 33.0 pg    MCHC 31.4 (L) 31.5 - 35.7 g/dL    RDW 40.9 81.1 - 91.4 %    Platelet 350 150 - 450 x10E3/uL    Neutrophils % 49 Not Estab. %    Lymphocytes % 31 Not Estab. %    Monocytes % 15 Not Estab. %    Eosinophils % 4 Not Estab. %    Basophils % 1 Not Estab. %    Absolute Neutrophils 3.0 1.4 - 7.0 x10E3/uL    Absolute Lymphocytes 1.9 0.7 - 3.1 x10E3/uL    Absolute Monocytes  0.9 0.1 - 0.9 x10E3/uL    Absolute Eosinophils 0.2 0.0 - 0.4 x10E3/uL    Absolute Basophils  0.0 0.0 - 0.2 x10E3/uL    Immature Granulocytes 0 Not Estab. %    Bands Absolute 0.0 0 - 0 x10E3/uL   Comprehensive Metabolic Panel    Collection Time: 03/16/20  9:53  AM   Result Value Ref Range    Glucose 79 65 - 99 mg/dL    BUN 12 8 - 27 mg/dL    Creatinine 9.14 7.82 - 1.00 mg/dL    BUN/Creatinine Ratio 15 12 - 28    Sodium 140 134 - 144 mmol/L    Potassium 4.5 3.5 - 5.2 mmol/L    Chloride 104 96 - 106 mmol/L    CO2 20 20 - 29 mmol/L    Calcium 9.4 8.7 - 10.3 mg/dL    Total Protein 7.0 6.0 - 8.5 g/dL    Albumin 4.5 3.8 - 4.8 g/dL    Globulin, Total 2.5 1.5 - 4.5 g/dL    A/G Ratio 1.8 1.2 - 2.2    Total Bilirubin 0.3 0.0 - 1.2 mg/dL    Alkaline Phosphatase 83 48 - 121 IU/L    AST 50 (H) 0 - 40 IU/L    ALT 39 (H) 0 - 32 IU/L   Bilirubin, Direct    Collection Time: 03/16/20  9:53 AM   Result Value Ref Range    Bilirubin, Direct 0.11 0.00 - 0.40 mg/dL   Phosphorus Level    Collection Time: 03/16/20  9:53 AM   Result Value Ref Range    Phosphorus, Serum 2.4 (L) 3.0 - 4.3 mg/dL   Magnesium Level    Collection Time: 03/16/20  9:53 AM   Result Value Ref Range    Magnesium 1.5 (L) 1.6 - 2.3 mg/dL   Gamma GT    Collection Time: 03/16/20  9:53 AM   Result Value Ref Range    GGT 120 (H) 0 - 60 IU/L   Tacrolimus level    Collection Time: 03/16/20  9:53 AM   Result Value Ref Range    Tacrolimus Lvl 3.4 2.0 - 20.0 ng/mL

## 2020-03-20 LAB — TACROLIMUS, TROUGH: Lab: 3.6 — ABNORMAL LOW

## 2020-03-20 LAB — CMV DNA, QUANTITATIVE, PCR

## 2020-03-20 LAB — ESTIMATED AVERAGE GLUCOSE: Estimated average glucose:MCnc:Pt:Bld:Qn:Estimated from glycated hemoglobin: 123

## 2020-03-20 LAB — CMV QUANT: Lab: 0

## 2020-03-20 LAB — HEMOGLOBIN A1C: HEMOGLOBIN A1C: 5.9 % — ABNORMAL HIGH (ref 4.8–5.6)

## 2020-03-22 DIAGNOSIS — D849 Immunodeficiency, unspecified: Secondary | ICD-10-CM | POA: Insufficient documentation

## 2020-03-22 LAB — VITAMIN D, TOTAL (25OH): Lab: 40.8

## 2020-03-23 LAB — IMMUNOFIXATION ELECTROPHORESIS 1: Lab: 0

## 2020-03-23 LAB — SERUM PROTEIN ELECTROPHORESIS AND IMMUNOFIXATION
ALBUMIN (SPE): 4.2 g/dL (ref 3.5–5.0)
ALPHA-1 GLOBULIN: 0.4 g/dL (ref 0.2–0.5)
ALPHA-2 GLOBULIN: 0.8 g/dL (ref 0.5–1.1)
BETA-1 GLOBULIN: 0.5 g/dL (ref 0.3–0.6)
BETA-2 GLOBULIN: 0.5 g/dL (ref 0.2–0.6)
GAMMAGLOBULIN: 1.1 g/dL (ref 0.5–1.5)
PROTEIN TOTAL: 7.4 g/dL (ref 6.5–8.3)

## 2020-03-23 LAB — BK VIRUS QUANTITATIVE PCR, BLOOD: BK BLOOD RESULT: NOT DETECTED

## 2020-03-23 LAB — BK BLOOD QUANT: Lab: 0

## 2020-03-24 NOTE — Unmapped (Incomplete)
Per NP Martin-Velez patient needs to repeat her CMP and UA in 2-3 weeks. Concerned about the bilirubin in the urine.

## 2020-03-25 LAB — VITAMIN D 1,25-DIHYDROXY: 1,25-Dihydroxyvitamin D:MCnc:Pt:Ser/Plas:Qn:: 50

## 2020-03-26 LAB — HLA DS POST TRANSPLANT
ANTI-DONOR HLA-A #1 MFI: 81 MFI
ANTI-DONOR HLA-A #2 MFI: 121 MFI
ANTI-DONOR HLA-B #1 MFI: 179 MFI
ANTI-DONOR HLA-B #2 MFI: 232 MFI
ANTI-DONOR HLA-C #1 MFI: 201 MFI
ANTI-DONOR HLA-C #2 MFI: 179 MFI
ANTI-DONOR HLA-DQB #1 MFI: 141 MFI
ANTI-DONOR HLA-DQB #2 MFI: 706 MFI
ANTI-DONOR HLA-DR #1 MFI: 492 MFI
ANTI-DONOR HLA-DR #2 MFI: 327 MFI

## 2020-03-26 LAB — HLA CLASS 1 ANTIBODY

## 2020-03-26 LAB — FSAB CLASS 1 ANTIBODY SPECIFICITY

## 2020-03-26 LAB — ANTI-DONOR HLA-C #2 MFI: Lab: 179

## 2020-03-26 LAB — HLA CL2 AB RESULT: Lab: POSITIVE

## 2020-03-31 NOTE — Unmapped (Signed)
[facility] Refill Coordination Note    Specialty Medication(s) to be Shipped:   Transplant: Myfortic 180mg     Other medication(s) to be shipped:       Michelle Travis, DOB: 03/07/1956  Phone: [phone] (home)       All above HIPAA information was verified with patient.     Was a Nurse, learning disability used for this call? No    Completed refill call assessment today to schedule patient's medication shipment from the [facility] [phone]).       Specialty medication(s) and dose(s) confirmed: Regimen is correct and unchanged.   Changes to medications: [name] reports no changes at this time.  Changes to insurance: No  Questions for the pharmacist: No    Confirmed patient received Welcome Packet with first shipment. The patient will receive a drug information handout for each medication shipped and additional FDA Medication Guides as required.       DISEASE/MEDICATION-SPECIFIC INFORMATION        N/A    SPECIALTY MEDICATION ADHERENCE     Medication Adherence    Patient reported X missed doses in the last month: 0  Specialty Medication: myforytic 180mg   Patient is on additional specialty medications: No  Informant: patient  Reliability of informant: reliable  Patient is at risk for Non-Adherence: No  Adherence tools used: patient uses a pill box to manage medications                Myfortic 180 mg: 9 days of medicine on hand         SHIPPING     Shipping address confirmed in Epic.     Delivery Scheduled: Yes, Expected medication delivery date: 06/07.     Medication will be delivered via UPS to the prescription address in Epic WAM.    Antonietta Barcelona   [facility] Specialty Technician

## 2020-04-02 MED FILL — MYFORTIC 180 MG TABLET,DELAYED RELEASE: ORAL | 30 days supply | Qty: 120 | Fill #3

## 2020-04-02 MED FILL — MYFORTIC 180 MG TABLET,DELAYED RELEASE: 30 days supply | Qty: 120 | Fill #3 | Status: AC

## 2020-04-07 LAB — CBC W/ DIFFERENTIAL
BANDED NEUTROPHILS ABSOLUTE COUNT: 0 10*3/uL (ref 0.0–0.1)
BASOPHILS RELATIVE PERCENT: 1 %
EOSINOPHILS ABSOLUTE COUNT: 0.2 10*3/uL (ref 0.0–0.4)
EOSINOPHILS RELATIVE PERCENT: 4 %
HEMATOCRIT: 42.1 % (ref 34.0–46.6)
HEMOGLOBIN: 13.1 g/dL (ref 11.1–15.9)
IMMATURE GRANULOCYTES: 0 %
LYMPHOCYTES ABSOLUTE COUNT: 2.2 10*3/uL (ref 0.7–3.1)
LYMPHOCYTES RELATIVE PERCENT: 37 %
MEAN CORPUSCULAR HEMOGLOBIN CONC: 31.1 g/dL — ABNORMAL LOW (ref 31.5–35.7)
MEAN CORPUSCULAR HEMOGLOBIN: 26.4 pg — ABNORMAL LOW (ref 26.6–33.0)
MEAN CORPUSCULAR VOLUME: 85 fL (ref 79–97)
MONOCYTES ABSOLUTE COUNT: 0.5 10*3/uL (ref 0.1–0.9)
MONOCYTES RELATIVE PERCENT: 8 %
NEUTROPHILS RELATIVE PERCENT: 50 %
PLATELET COUNT: 319 10*3/uL (ref 150–450)
RED CELL DISTRIBUTION WIDTH: 14.8 % (ref 11.7–15.4)
WHITE BLOOD CELL COUNT: 5.8 10*3/uL (ref 3.4–10.8)

## 2020-04-07 LAB — BILIRUBIN DIRECT: Bilirubin.glucuronidated+Bilirubin.albumin bound:MCnc:Pt:Ser/Plas:Qn:: 0.11

## 2020-04-07 LAB — COMPREHENSIVE METABOLIC PANEL
A/G RATIO: 1.5 (ref 1.2–2.2)
ALBUMIN: 4.2 g/dL (ref 3.8–4.8)
ALKALINE PHOSPHATASE: 75 IU/L (ref 48–121)
ALT (SGPT): 19 IU/L (ref 0–32)
BILIRUBIN TOTAL: 0.3 mg/dL (ref 0.0–1.2)
BLOOD UREA NITROGEN: 13 mg/dL (ref 8–27)
BUN / CREAT RATIO: 17 (ref 12–28)
CALCIUM: 9.6 mg/dL (ref 8.7–10.3)
CHLORIDE: 105 mmol/L (ref 96–106)
CO2: 23 mmol/L (ref 20–29)
GLOBULIN, TOTAL: 2.8 g/dL (ref 1.5–4.5)
GLUCOSE: 103 mg/dL — ABNORMAL HIGH (ref 65–99)
POTASSIUM: 4.8 mmol/L (ref 3.5–5.2)
SODIUM: 142 mmol/L (ref 134–144)
TOTAL PROTEIN: 7 g/dL (ref 6.0–8.5)

## 2020-04-07 LAB — URINALYSIS
BILIRUBIN UA: NEGATIVE
BLOOD UA: NEGATIVE
GLUCOSE UA: NEGATIVE
KETONES UA: NEGATIVE
LEUKOCYTE ESTERASE UA: NEGATIVE
NITRITE UA: NEGATIVE
PH UA: 6 (ref 5.0–7.5)
UROBILINOGEN UA: 1 mg/dL (ref 0.2–1.0)

## 2020-04-07 LAB — NITRITE UA: Nitrite:PrThr:Pt:Urine:Ord:Test strip: NEGATIVE

## 2020-04-07 LAB — MAGNESIUM: Magnesium:MCnc:Pt:Ser/Plas:Qn:: 1.7

## 2020-04-07 LAB — PHOSPHORUS: PHOSPHORUS, SERUM: 2.6 mg/dL — ABNORMAL LOW (ref 3.0–4.3)

## 2020-04-07 LAB — GAMMA GLUTAMYL TRANSFERASE: Gamma glutamyl transferase:CCnc:Pt:Ser/Plas:Qn:: 50

## 2020-04-07 LAB — MICROSCOPIC EXAMINATION
CASTS: NONE SEEN /LPF
EPITHELIAL CELLS (NON RENAL): >10 /HPF — AB (ref 0–10)

## 2020-04-07 LAB — BANDED NEUTROPHILS ABSOLUTE COUNT: Granulocytes.immature:NCnc:Pt:Bld:Qn:Automated count: 0

## 2020-04-07 LAB — GLUCOSE: Glucose:MCnc:Pt:Ser/Plas:Qn:: 103 — ABNORMAL HIGH

## 2020-04-07 LAB — BACTERIA

## 2020-04-07 LAB — PHOSPHORUS, SERUM: Phosphate:MCnc:Pt:Ser/Plas:Qn:: 2.6 — ABNORMAL LOW

## 2020-04-08 ENCOUNTER — Other Ambulatory Visit (HOSPITAL_COMMUNITY): Payer: Self-pay | Admitting: Internal Medicine

## 2020-04-08 DIAGNOSIS — Z1231 Encounter for screening mammogram for malignant neoplasm of breast: Secondary | ICD-10-CM

## 2020-04-08 LAB — TACROLIMUS BLOOD: Tacrolimus:MCnc:Pt:Bld:Qn:LC/MS/MS: 3.9

## 2020-04-12 DIAGNOSIS — K769 Liver disease, unspecified: Principal | ICD-10-CM

## 2020-04-12 DIAGNOSIS — Z79899 Other long term (current) drug therapy: Principal | ICD-10-CM

## 2020-04-12 DIAGNOSIS — Z944 Liver transplant status: Principal | ICD-10-CM

## 2020-04-12 DIAGNOSIS — Z940 Kidney transplant status: Principal | ICD-10-CM

## 2020-04-15 ENCOUNTER — Ambulatory Visit (HOSPITAL_COMMUNITY)
Admission: RE | Admit: 2020-04-15 | Discharge: 2020-04-15 | Disposition: A | Discharge status: Home / Self Care | Payer: Medicare HMO | Source: Ambulatory Visit | Attending: Internal Medicine | Admitting: Internal Medicine

## 2020-04-15 ENCOUNTER — Other Ambulatory Visit: Payer: Self-pay

## 2020-04-15 DIAGNOSIS — Z1231 Encounter for screening mammogram for malignant neoplasm of breast: Secondary | ICD-10-CM | POA: Diagnosis present

## 2020-04-16 NOTE — Unmapped (Signed)
Confirmed with NP Martin-Velz labs are improved and no intervention needed for UA since patient confirmed she is asymptomatic.

## 2020-04-22 ENCOUNTER — Ambulatory Visit (HOSPITAL_COMMUNITY): Payer: Medicare HMO

## 2020-04-24 MED ORDER — ATORVASTATIN 10 MG TABLET
ORAL_TABLET | Freq: Every day | ORAL | 8 refills | 30.00000 days | Status: CN
Start: 2020-04-24 — End: ?

## 2020-04-27 NOTE — Unmapped (Signed)
[facility] Refill Coordination Note    Specialty Medication(s) to be Shipped:   Transplant: Myfortic 180mg     Other medication(s) to be shipped: none     RUKIA MCGILLIVRAY, DOB: 09-20-1956  Phone: [phone] (home)       All above HIPAA information was verified with patient.     Was a Nurse, learning disability used for this call? No    Completed refill call assessment today to schedule patient's medication shipment from the [facility] [phone]).       Specialty medication(s) and dose(s) confirmed: Regimen is correct and unchanged.   Changes to medications: Ilena reports no changes at this time.  Changes to insurance: No  Questions for the pharmacist: No    Confirmed patient received Welcome Packet with first shipment. The patient will receive a drug information handout for each medication shipped and additional FDA Medication Guides as required.       DISEASE/MEDICATION-SPECIFIC INFORMATION        N/A    SPECIALTY MEDICATION ADHERENCE     Medication Adherence    Patient reported X missed doses in the last month: 0  Adherence tools used: patient uses a pill box to manage medications          Myfortic 180mg : 10 days worth of medication on hand.        SHIPPING     Shipping address confirmed in Epic.     Delivery Scheduled: Yes, Expected medication delivery date: 05/04/20.     Medication will be delivered via UPS to the prescription address in Epic WAM.    [place] A Tandra Rosado   [facility] Specialty Technician

## 2020-04-30 MED ORDER — OXYCODONE 10 MG TABLET
0 days
Start: 2020-04-30 — End: ?

## 2020-04-30 MED FILL — MYFORTIC 180 MG TABLET,DELAYED RELEASE: 30 days supply | Qty: 120 | Fill #4 | Status: AC

## 2020-04-30 MED FILL — MYFORTIC 180 MG TABLET,DELAYED RELEASE: ORAL | 30 days supply | Qty: 120 | Fill #4

## 2020-05-10 DIAGNOSIS — Z944 Liver transplant status: Principal | ICD-10-CM

## 2020-05-10 DIAGNOSIS — Z79899 Other long term (current) drug therapy: Principal | ICD-10-CM

## 2020-05-10 DIAGNOSIS — K769 Liver disease, unspecified: Principal | ICD-10-CM

## 2020-05-10 DIAGNOSIS — Z940 Kidney transplant status: Principal | ICD-10-CM

## 2020-05-25 NOTE — Unmapped (Signed)
[facility] Refill Coordination Note    Specialty Medication(s) to be Shipped:   Transplant: Myfortic 180mg     Other medication(s) to be shipped: No additional medications requested for fill at this time     IDONNA HEEREN, DOB: Mar 12, 1956  Phone: [phone] (home)       All above HIPAA information was verified with patient.     Was a Nurse, learning disability used for this call? No    Completed refill call assessment today to schedule patient's medication shipment from the [facility] [phone]).       Specialty medication(s) and dose(s) confirmed: Regimen is correct and unchanged.   Changes to medications: Dianca reports no changes at this time.  Changes to insurance: No  Questions for the pharmacist: No    Confirmed patient received Welcome Packet with first shipment. The patient will receive a drug information handout for each medication shipped and additional FDA Medication Guides as required.       DISEASE/MEDICATION-SPECIFIC INFORMATION        N/A    SPECIALTY MEDICATION ADHERENCE     Medication Adherence    Patient reported X missed doses in the last month: 0  Specialty Medication: Myfortic 180mg   Patient is on additional specialty medications: No  Adherence tools used: patient uses a pill box to manage medications        Myfortic 180 mg: 8 days of medicine on hand     SHIPPING     Shipping address confirmed in Epic.     Delivery Scheduled: Yes, Expected medication delivery date: 05/31/2020.     Medication will be delivered via UPS to the prescription address in Epic WAM.    [name] [facility] Specialty Technician

## 2020-05-28 ENCOUNTER — Ambulatory Visit
Admit: 2020-05-28 | Discharge: 2020-05-29 | Payer: MEDICARE | Attending: Nurse Practitioner | Primary: Nurse Practitioner

## 2020-05-28 DIAGNOSIS — S61309A Unspecified open wound of unspecified finger with damage to nail, initial encounter: Principal | ICD-10-CM

## 2020-05-28 MED ORDER — CEPHALEXIN 500 MG CAPSULE
ORAL_CAPSULE | Freq: Four times a day (QID) | ORAL | 0 refills | 10.00000 days | Status: CP
Start: 2020-05-28 — End: 2020-06-07

## 2020-05-28 MED FILL — MYFORTIC 180 MG TABLET,DELAYED RELEASE: ORAL | 30 days supply | Qty: 120 | Fill #5

## 2020-05-28 MED FILL — MYFORTIC 180 MG TABLET,DELAYED RELEASE: 30 days supply | Qty: 120 | Fill #5 | Status: AC

## 2020-05-28 NOTE — Unmapped (Signed)
Subjective:     Michelle Travis presents with a   Chief Complaint   Patient presents with   ??? Hand Pain     RIGHT THUMB NAIL    of an injury to right thumbnail.  This started approximately 4 hours ago.  Michelle Travis has tried acetaminophen for the discomfort with minimal relief.      The following portions of the patient's history were reviewed and updated as appropriate: allergies, current medications, past family history, past medical history, past social history, past surgical history and problem list.    Review of Systems:     As noted in HPI. All other ROS negative.      Objective:     Vitals:    05/28/20 1132   BP: 131/84   Pulse: 59   Temp: 36.4 ??C (97.5 ??F)        Toenail Excision Procedure Note    Symptoms associates with the lesion are: pain, drainage..  Digit Affected:  Right thumb    The patient gave informed consent for the procedure. The area was prepped with povidone-iodine solution. The area was anesthetized using the solution circled below. The patient tolerated the anesthetic well.     The skin was reprepped with povidone-iodine solution and draped with sterile drapes.  Scissors were used to remove 1 of the nails without difficulty in the standard fashion.  The offending area was removed, and  the skin debrided.    Hemostasis was achieved with direct pressure OR the below listed agent.  The patient tolerated the procedure well. Direct wound pressure was applied following the procedure. The skin was cleaned, antibiotic ointment was applied, and a gauze pressure dressing was applied with an elastic bandage. Extensive instructions were given to the patient.     Anesthetic solution:  1% Lidocaine    Hemostasis:  achieved with direct pressure    Complications: None    Assessment:     1. Traumatic avulsion of nail plate of finger, initial encounter            Plan:     1.  Labs and orders for today's visit:  No orders of the defined types were placed in this encounter.    Outpatient Encounter Medications as of 05/28/2020   Medication Sig Dispense Refill   ??? aspirin (ECOTRIN) 81 MG tablet Take 1 tablet (81 mg total) by mouth daily. 30 tablet 11   ??? atorvastatin (LIPITOR) 10 MG tablet Take 1 tablet (10 mg total) by mouth daily. 30 tablet 8   ??? BELBUCA 300 mcg buccal film Apply 1 each to cheek daily.     ??? cephalexin (KEFLEX) 500 MG capsule Take 1 capsule (500 mg total) by mouth Four (4) times a day for 10 days. 40 capsule 0   ??? cholecalciferol, vitamin D3, 1,000 unit (25 mcg) tablet Take 1,000 Units by mouth daily.     ??? ENVARSUS XR 1 mg Tb24 extended release tablet Take 2 tablets (2 mg total) by mouth daily. 180 tablet 3   ??? MYFORTIC 180 mg EC tablet Take 2 tablets (360 mg total) by mouth two (2) times a day. 120 tablet 11   ??? oxyCODONE (ROXICODONE) 10 mg immediate release tablet TAKE 1 (ONE) TABLET BY MOUTH FOUR TIMES DAILY, AS NEEDED     ??? pantoprazole (PROTONIX) 40 MG tablet Take 1 tablet (40 mg total) by mouth daily. 30 tablet 0   ??? [DISCONTINUED] oxyCODONE (OXY-IR) 5 mg capsule Take 5 mg by mouth every  six (6) hours as needed for pain.       No facility-administered encounter medications on file as of 05/28/2020.         Patient Instructions     Patient Education        Toenail or Fingernail Avulsion: Care Instructions  Your Care Instructions  Losing a toenail or fingernail because of an injury is called avulsion. The nail may be completely or partially torn off after a trauma to the area.  Your doctor may have removed the nail, put part of it back into place, or repaired the nail bed. Your toe or finger may be sore after treatment. You may have stitches.  You may have some swelling, color changes, and bloody crusting on or around the wound for 2 or 3 days. This is normal. Taking good care of your wound at home will help it heal quickly and reduce your chance of infection.  The wound should heal within a few weeks. If completely removed, fingernails may take 6 months to grow back. Toenails may take 12 to 18 months to grow back. Injured nails may look different when they grow back.  Follow-up care is a key part of your treatment and safety. Be sure to make and go to all appointments, and call your doctor if you are having problems. It's also a good idea to know your test results and keep a list of the medicines you take.  How can you care for yourself at home?  ?? If possible, prop up the injured area on a pillow anytime you sit or lie down during the next 3 days. Try to keep it above the level of your heart. This will help reduce swelling.  ?? Leave the bandage on, and if you have stitches, do not get them wet for the first 24 to 48 hours. Use a plastic bag to cover the area when you shower.  ?? If your doctor told you how to care for your wound, follow your doctor's instructions. If you did not get instructions, follow this general advice:  ? After the first 24 to 48 hours, you can remove the bandage and gently wash around the wound with clean water 2 times a day. If the bandage sticks to the wound, use warm water to loosen it. Do not scrub or soak the area.  ? You may cover the wound with a thin layer of petroleum jelly, such as Vaseline, and a nonstick bandage.  ? Apply more petroleum jelly and replace the bandage as needed.  ?? Do not go swimming.  ?? If you have stitches, do not remove them on your own. Your doctor will tell you when to return to have the stitches removed.  ?? Be safe with medicines. Take pain medicines exactly as directed.  ? If the doctor gave you a prescription medicine for pain, take it as prescribed.  ? If you are not taking a prescription pain medicine, ask your doctor if you can take an over-the-counter medicine.  ?? If your doctor prescribed antibiotics, take them as directed. Do not stop taking them just because you feel better. You need to take the full course of antibiotics.  When should you call for help?   Call your doctor now or seek immediate medical care if:  ?? ?? The skin near the wound is cool or pale or changes color.   ?? ?? The wound starts to bleed, and blood soaks through the bandage. Oozing small amounts of blood  is normal.   ?? ?? You have signs of infection, such as:  ? Increased pain, swelling, warmth, or redness.  ? Red streaks leading from your toe or finger.  ? Pus draining from your toe or finger.  ? Swollen lymph nodes in your neck, armpits, or groin.  ? A fever.   Watch closely for changes in your health, and be sure to contact your doctor if:  ?? ?? You have problems with the nail as it grows back.   ?? ?? You do not get better as expected.   Where can you learn more?  Go to [facility] at https://myuncchart.org  Select Patient Education under [employer]. Enter [phone] in the search box to learn more about Toenail or Fingernail Avulsion: Care Instructions.  Current as of: December 31, 2019??????????????????????????????Content Version: 12.9  ?? 2006-2021 [employer].   Care instructions adapted under license by [facility]. If you have questions about a medical condition or this instruction, always ask your healthcare professional. [employer], Inc[employer] disclaims any warranty or liability for your use of this information.              Previsit planning was completed via snapshot and review of chart.   I reviewed the patient instructions on the AVS with the patient/family verbalized to me that they understood what their problem is, what they need to do about it, and why it is important that they do it.   The patient/family voices understanding of all medications. No barriers to adherence were noted. Patient is taking all medications as prescribed and is tolerating well.   Plan for follow-up as discussed or as needed if any worsening symptoms or change in condition.   After Visit Summary was given to patient.

## 2020-05-28 NOTE — Unmapped (Signed)
Depression: Not at risk    PHQ-2 Score: 0

## 2020-05-28 NOTE — Unmapped (Signed)
Patient Education        Toenail or Fingernail Avulsion: Care Instructions  Your Care Instructions  Losing a toenail or fingernail because of an injury is called avulsion. The nail may be completely or partially torn off after a trauma to the area.  Your doctor may have removed the nail, put part of it back into place, or repaired the nail bed. Your toe or finger may be sore after treatment. You may have stitches.  You may have some swelling, color changes, and bloody crusting on or around the wound for 2 or 3 days. This is normal. Taking good care of your wound at home will help it heal quickly and reduce your chance of infection.  The wound should heal within a few weeks. If completely removed, fingernails may take 6 months to grow back. Toenails may take 12 to 18 months to grow back. Injured nails may look different when they grow back.  Follow-up care is a key part of your treatment and safety. Be sure to make and go to all appointments, and call your doctor if you are having problems. It's also a good idea to know your test results and keep a list of the medicines you take.  How can you care for yourself at home?  ?? If possible, prop up the injured area on a pillow anytime you sit or lie down during the next 3 days. Try to keep it above the level of your heart. This will help reduce swelling.  ?? Leave the bandage on, and if you have stitches, do not get them wet for the first 24 to 48 hours. Use a plastic bag to cover the area when you shower.  ?? If your doctor told you how to care for your wound, follow your doctor's instructions. If you did not get instructions, follow this general advice:  ? After the first 24 to 48 hours, you can remove the bandage and gently wash around the wound with clean water 2 times a day. If the bandage sticks to the wound, use warm water to loosen it. Do not scrub or soak the area.  ? You may cover the wound with a thin layer of petroleum jelly, such as Vaseline, and a nonstick bandage.  ? Apply more petroleum jelly and replace the bandage as needed.  ?? Do not go swimming.  ?? If you have stitches, do not remove them on your own. Your doctor will tell you when to return to have the stitches removed.  ?? Be safe with medicines. Take pain medicines exactly as directed.  ? If the doctor gave you a prescription medicine for pain, take it as prescribed.  ? If you are not taking a prescription pain medicine, ask your doctor if you can take an over-the-counter medicine.  ?? If your doctor prescribed antibiotics, take them as directed. Do not stop taking them just because you feel better. You need to take the full course of antibiotics.  When should you call for help?   Call your doctor now or seek immediate medical care if:  ?? ?? The skin near the wound is cool or pale or changes color.   ?? ?? The wound starts to bleed, and blood soaks through the bandage. Oozing small amounts of blood is normal.   ?? ?? You have signs of infection, such as:  ? Increased pain, swelling, warmth, or redness.  ? Red streaks leading from your toe or finger.  ? Pus draining from  your toe or finger.  ? Swollen lymph nodes in your neck, armpits, or groin.  ? A fever.   Watch closely for changes in your health, and be sure to contact your doctor if:  ?? ?? You have problems with the nail as it grows back.   ?? ?? You do not get better as expected.   Where can you learn more?  Go to [facility] at [email or web address]  Select Patient Education under [employer]. Enter [phone] in the search box to learn more about Toenail or Fingernail Avulsion: Care Instructions.  Current as of: December 31, 2019??????????????????????????????Content Version: 12.9  ?? 2006-2021 [employer].   Care instructions adapted under license by [facility]. If you have questions about a medical condition or this instruction, always ask your healthcare professional. Health[employer], Inc[employer] disclaims any warranty or liability for your use of this information.

## 2020-06-07 DIAGNOSIS — Z944 Liver transplant status: Principal | ICD-10-CM

## 2020-06-07 DIAGNOSIS — Z940 Kidney transplant status: Principal | ICD-10-CM

## 2020-06-07 DIAGNOSIS — Z79899 Other long term (current) drug therapy: Principal | ICD-10-CM

## 2020-06-07 DIAGNOSIS — K769 Liver disease, unspecified: Principal | ICD-10-CM

## 2020-06-17 LAB — CBC W/ DIFFERENTIAL
BANDED NEUTROPHILS ABSOLUTE COUNT: 0 10*3/uL (ref 0.0–0.1)
BASOPHILS ABSOLUTE COUNT: 0.1 10*3/uL (ref 0.0–0.2)
EOSINOPHILS ABSOLUTE COUNT: 0.2 10*3/uL (ref 0.0–0.4)
EOSINOPHILS RELATIVE PERCENT: 2 %
HEMATOCRIT: 42.7 % (ref 34.0–46.6)
HEMOGLOBIN: 13.2 g/dL (ref 11.1–15.9)
IMMATURE GRANULOCYTES: 0 %
LYMPHOCYTES ABSOLUTE COUNT: 2.7 10*3/uL (ref 0.7–3.1)
LYMPHOCYTES RELATIVE PERCENT: 39 %
MEAN CORPUSCULAR HEMOGLOBIN CONC: 30.9 g/dL — ABNORMAL LOW (ref 31.5–35.7)
MEAN CORPUSCULAR HEMOGLOBIN: 26.2 pg — ABNORMAL LOW (ref 26.6–33.0)
MONOCYTES ABSOLUTE COUNT: 0.6 10*3/uL (ref 0.1–0.9)
NEUTROPHILS ABSOLUTE COUNT: 3.4 10*3/uL (ref 1.4–7.0)
NEUTROPHILS RELATIVE PERCENT: 49 %
PLATELET COUNT: 328 10*3/uL (ref 150–450)
RED BLOOD CELL COUNT: 5.03 x10E6/uL (ref 3.77–5.28)
RED CELL DISTRIBUTION WIDTH: 13.5 % (ref 11.7–15.4)
WHITE BLOOD CELL COUNT: 7 10*3/uL (ref 3.4–10.8)

## 2020-06-17 LAB — COMPREHENSIVE METABOLIC PANEL
A/G RATIO: 1.8 (ref 1.2–2.2)
ALBUMIN: 4.6 g/dL (ref 3.8–4.8)
ALKALINE PHOSPHATASE: 95 IU/L (ref 48–121)
ALT (SGPT): 19 IU/L (ref 0–32)
BILIRUBIN TOTAL: 0.3 mg/dL (ref 0.0–1.2)
BLOOD UREA NITROGEN: 15 mg/dL (ref 8–27)
BUN / CREAT RATIO: 26 (ref 12–28)
CALCIUM: 9.4 mg/dL (ref 8.7–10.3)
CHLORIDE: 104 mmol/L (ref 96–106)
CO2: 25 mmol/L (ref 20–29)
CREATININE: 0.58 mg/dL (ref 0.57–1.00)
GLOBULIN, TOTAL: 2.6 g/dL (ref 1.5–4.5)
GLUCOSE: 77 mg/dL (ref 65–99)
POTASSIUM: 4.3 mmol/L (ref 3.5–5.2)
SODIUM: 143 mmol/L (ref 134–144)
TOTAL PROTEIN: 7.2 g/dL (ref 6.0–8.5)

## 2020-06-17 LAB — BILIRUBIN DIRECT: Bilirubin.glucuronidated+Bilirubin.albumin bound:MCnc:Pt:Ser/Plas:Qn:: 0.08

## 2020-06-17 LAB — BUN / CREAT RATIO: Urea nitrogen/Creatinine:MRto:Pt:Ser/Plas:Qn:: 26

## 2020-06-17 LAB — PHOSPHORUS, SERUM: Phosphate:MCnc:Pt:Ser/Plas:Qn:: 3.4

## 2020-06-17 LAB — GAMMA GLUTAMYL TRANSFERASE: Gamma glutamyl transferase:CCnc:Pt:Ser/Plas:Qn:: 28

## 2020-06-17 LAB — MAGNESIUM: Magnesium:MCnc:Pt:Ser/Plas:Qn:: 1.5 — ABNORMAL LOW

## 2020-06-17 LAB — MONOCYTES RELATIVE PERCENT: Monocytes/100 leukocytes:NFr:Pt:Bld:Qn:Automated count: 9

## 2020-06-18 LAB — TACROLIMUS BLOOD: Tacrolimus:MCnc:Pt:Bld:Qn:LC/MS/MS: 5.6

## 2020-06-23 NOTE — Unmapped (Signed)
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     Was a Nurse, learning disability used for this call? No    Completed refill call assessment today to schedule patient's medication shipment from the [facility] [phone]).       Specialty medication(s) and dose(s) confirmed: Regimen is correct and unchanged.   Changes to medications: [name] reports no changes at this time.  Changes to insurance: No  Questions for the pharmacist: No    Confirmed patient received Welcome Packet with first shipment. The patient will receive a drug information handout for each medication shipped and additional FDA Medication Guides as required.       DISEASE/MEDICATION-SPECIFIC INFORMATION        N/A    SPECIALTY MEDICATION ADHERENCE     Medication Adherence    Patient reported X missed doses in the last month: 0  Specialty Medication: Myfortic 180mg   Patient is on additional specialty medications: No  Adherence tools used: patient uses a pill box to manage medications        Myfortic 180 mg: 7 days of medicine on hand     SHIPPING     Shipping address confirmed in Epic.     Delivery Scheduled: Yes, Expected medication delivery date: 06/29/2020.     Medication will be delivered via UPS to the prescription address in Epic WAM.    [name] [facility] Specialty Technician

## 2020-06-28 MED FILL — MYFORTIC 180 MG TABLET,DELAYED RELEASE: 30 days supply | Qty: 120 | Fill #6 | Status: AC

## 2020-06-28 MED FILL — MYFORTIC 180 MG TABLET,DELAYED RELEASE: ORAL | 30 days supply | Qty: 120 | Fill #6

## 2020-07-05 DIAGNOSIS — Z940 Kidney transplant status: Principal | ICD-10-CM

## 2020-07-05 DIAGNOSIS — K769 Liver disease, unspecified: Principal | ICD-10-CM

## 2020-07-05 DIAGNOSIS — Z944 Liver transplant status: Principal | ICD-10-CM

## 2020-07-05 DIAGNOSIS — Z79899 Other long term (current) drug therapy: Principal | ICD-10-CM

## 2020-07-14 NOTE — Unmapped (Signed)
left message, called to schedule follow up appt with Dr. Margaretmary Bayley.  Can be scheduled from the Recall tab and recall letter mailed.

## 2020-07-20 NOTE — Unmapped (Signed)
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     Was a Nurse, learning disability used for this call? No    Completed refill call assessment today to schedule patient's medication shipment from the [facility] [phone]).       Specialty medication(s) and dose(s) confirmed: Regimen is correct and unchanged.   Changes to medications: Michelle Travis reports no changes at this time.  Changes to insurance: No  Questions for the pharmacist: No    Confirmed patient received Welcome Packet with first shipment. The patient will receive a drug information handout for each medication shipped and additional FDA Medication Guides as required.       DISEASE/MEDICATION-SPECIFIC INFORMATION        N/A    SPECIALTY MEDICATION ADHERENCE     Medication Adherence    Patient reported X missed doses in the last month: 0  Specialty Medication: Myfortic 180mg   Patient is on additional specialty medications: No  Adherence tools used: patient uses a pill box to manage medications        Myfortic 180 mg: 10 days of medicine on hand     SHIPPING     Shipping address confirmed in Epic.     Delivery Scheduled: Yes, Expected medication delivery date: 07/27/2020.     Medication will be delivered via UPS to the prescription address in Epic WAM.    Michelle Travis [facility] Specialty Technician

## 2020-07-26 MED FILL — MYFORTIC 180 MG TABLET,DELAYED RELEASE: ORAL | 30 days supply | Qty: 120 | Fill #7

## 2020-07-26 MED FILL — MYFORTIC 180 MG TABLET,DELAYED RELEASE: 30 days supply | Qty: 120 | Fill #7 | Status: AC

## 2020-08-02 DIAGNOSIS — Z940 Kidney transplant status: Principal | ICD-10-CM

## 2020-08-02 DIAGNOSIS — K769 Liver disease, unspecified: Principal | ICD-10-CM

## 2020-08-02 DIAGNOSIS — Z944 Liver transplant status: Principal | ICD-10-CM

## 2020-08-02 DIAGNOSIS — Z79899 Other long term (current) drug therapy: Principal | ICD-10-CM

## 2020-08-12 LAB — GAMMA GLUTAMYL TRANSFERASE: Gamma glutamyl transferase:CCnc:Pt:Ser/Plas:Qn:: 45

## 2020-08-12 LAB — COMPREHENSIVE METABOLIC PANEL
A/G RATIO: 1.8 (ref 1.2–2.2)
ALBUMIN: 4.3 g/dL (ref 3.8–4.8)
ALT (SGPT): 23 IU/L (ref 0–32)
AST (SGOT): 21 IU/L (ref 0–40)
BILIRUBIN TOTAL: <0.2 mg/dL (ref 0.0–1.2)
BLOOD UREA NITROGEN: 8 mg/dL (ref 8–27)
CALCIUM: 9.5 mg/dL (ref 8.7–10.3)
CHLORIDE: 106 mmol/L (ref 96–106)
CO2: 24 mmol/L (ref 20–29)
CREATININE: 0.64 mg/dL (ref 0.57–1.00)
GLOBULIN, TOTAL: 2.4 g/dL (ref 1.5–4.5)
GLUCOSE: 105 mg/dL — ABNORMAL HIGH (ref 65–99)
POTASSIUM: 4.6 mmol/L (ref 3.5–5.2)
SODIUM: 143 mmol/L (ref 134–144)
TOTAL PROTEIN: 6.7 g/dL (ref 6.0–8.5)

## 2020-08-12 LAB — CBC W/ DIFFERENTIAL
BANDED NEUTROPHILS ABSOLUTE COUNT: 0 10*3/uL (ref 0.0–0.1)
BASOPHILS ABSOLUTE COUNT: 0.1 10*3/uL (ref 0.0–0.2)
BASOPHILS RELATIVE PERCENT: 1 %
EOSINOPHILS RELATIVE PERCENT: 4 %
HEMATOCRIT: 40.1 % (ref 34.0–46.6)
HEMOGLOBIN: 12.8 g/dL (ref 11.1–15.9)
IMMATURE GRANULOCYTES: 0 %
LYMPHOCYTES ABSOLUTE COUNT: 2.3 10*3/uL (ref 0.7–3.1)
LYMPHOCYTES RELATIVE PERCENT: 36 %
MEAN CORPUSCULAR HEMOGLOBIN: 26.6 pg (ref 26.6–33.0)
MEAN CORPUSCULAR VOLUME: 83 fL (ref 79–97)
MONOCYTES ABSOLUTE COUNT: 0.5 10*3/uL (ref 0.1–0.9)
MONOCYTES RELATIVE PERCENT: 8 %
NEUTROPHILS ABSOLUTE COUNT: 3.2 10*3/uL (ref 1.4–7.0)
NEUTROPHILS RELATIVE PERCENT: 51 %
PLATELET COUNT: 374 10*3/uL (ref 150–450)
RED BLOOD CELL COUNT: 4.81 x10E6/uL (ref 3.77–5.28)
RED CELL DISTRIBUTION WIDTH: 13.9 % (ref 11.7–15.4)
WHITE BLOOD CELL COUNT: 6.2 10*3/uL (ref 3.4–10.8)

## 2020-08-12 LAB — BILIRUBIN TOTAL: Bilirubin:MCnc:Pt:Ser/Plas:Qn:: <0.2

## 2020-08-12 LAB — BILIRUBIN DIRECT: Bilirubin.glucuronidated+Bilirubin.albumin bound:MCnc:Pt:Ser/Plas:Qn:: <0.1

## 2020-08-12 LAB — EOSINOPHILS ABSOLUTE COUNT: Eosinophils:NCnc:Pt:Bld:Qn:Automated count: 0.2

## 2020-08-12 LAB — PHOSPHORUS, SERUM: Phosphate:MCnc:Pt:Ser/Plas:Qn:: 2.4 — ABNORMAL LOW

## 2020-08-12 LAB — MAGNESIUM: Magnesium:MCnc:Pt:Ser/Plas:Qn:: 1.5 — ABNORMAL LOW

## 2020-08-13 LAB — TACROLIMUS BLOOD: Tacrolimus:MCnc:Pt:Bld:Qn:LC/MS/MS: 4.3

## 2020-08-16 NOTE — Unmapped (Signed)
[facility] Pharmacy Clinical Assessment & Refill Coordination Note    Michelle Travis, DOB: 1956/04/13  Phone: [phone] (home)     All above HIPAA information was verified with patient.     Was a Nurse, learning disability used for this call? 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 aspirin (ECOTRIN) 81 MG tablet Take 1 tablet (81 mg total) by mouth daily. 30 tablet 11   ??? atorvastatin (LIPITOR) 10 MG tablet Take 1 tablet (10 mg total) by mouth daily. 30 tablet 8   ??? BELBUCA 300 mcg buccal film Apply 1 each to cheek daily.     ??? cholecalciferol, vitamin D3, 1,000 unit (25 mcg) tablet Take 1,000 Units by mouth daily.     ??? ENVARSUS XR 1 mg Tb24 extended release tablet Take 2 tablets (2 mg total) by mouth daily. 180 tablet 3   ??? MYFORTIC 180 mg EC tablet Take 2 tablets (360 mg total) by mouth two (2) times a day. 120 tablet 11   ??? oxyCODONE (ROXICODONE) 10 mg immediate release tablet TAKE 1 (ONE) TABLET BY MOUTH FOUR TIMES DAILY, AS NEEDED     ??? pantoprazole (PROTONIX) 40 MG tablet Take 1 tablet (40 mg total) by mouth daily. 30 tablet 0     No current facility-administered medications for this visit.        Changes to medications: Leathia reports no changes at this time.    Allergies   Allergen Reactions   ??? Latex Swelling and Rash     Discoloration of skin.   Discoloration of skin.    ??? Hydrocodone Itching   ??? Hydrocodone-Acetaminophen Itching       Changes to allergies: No    SPECIALTY MEDICATION ADHERENCE     Myfortic 180mg   : 12 days of medicine on hand       Medication Adherence    Patient reported X missed doses in the last month: 0  Specialty Medication: myfortic 180mg   Adherence tools used: patient uses a pill box to manage medications          Specialty medication(s) dose(s) confirmed: Regimen is correct and unchanged.     Are there any concerns with adherence? No    Adherence counseling provided? Not needed    CLINICAL MANAGEMENT AND INTERVENTION      Clinical Benefit Assessment:    Do you feel the medicine is effective or helping your condition? Yes    Clinical Benefit counseling provided? Not needed    Adverse Effects Assessment:    Are you experiencing any side effects? No    Are you experiencing difficulty administering your medicine? No    Quality of Life Assessment:    How many days over the past month did your transplant  keep you from your normal activities? For example, brushing your teeth or getting up in the morning. 0    Have you discussed this with your provider? Not needed    Therapy Appropriateness:    Is therapy appropriate? Yes, therapy is appropriate and should be continued    DISEASE/MEDICATION-SPECIFIC INFORMATION      N/A    PATIENT SPECIFIC NEEDS     - Does the patient have any physical, cognitive, or cultural barriers? No    - Is the patient high risk? Yes, patient is taking a REMS drug.  Medication is dispensed in compliance with REMS program    - Does the patient require a Care Management Plan? No     - Does the patient require physician intervention or other additional services (i.e. nutrition, smoking cessation, social work)? 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     Medication will be delivered via UPS to the confirmed prescription address in [facility]    The patient will receive a drug information handout for each medication shipped and additional FDA Medication Guides as required.  Verified that patient has previously received a Conservation officer, historic buildings.    All of the patient's questions and concerns have been addressed.    [name]   [facility] Specialty Pharmacist

## 2020-08-24 MED FILL — MYFORTIC 180 MG TABLET,DELAYED RELEASE: ORAL | 30 days supply | Qty: 120 | Fill #8

## 2020-08-24 MED FILL — MYFORTIC 180 MG TABLET,DELAYED RELEASE: 30 days supply | Qty: 120 | Fill #8 | Status: AC

## 2020-08-30 DIAGNOSIS — Z944 Liver transplant status: Principal | ICD-10-CM

## 2020-08-30 DIAGNOSIS — Z79899 Other long term (current) drug therapy: Principal | ICD-10-CM

## 2020-08-30 DIAGNOSIS — K769 Liver disease, unspecified: Principal | ICD-10-CM

## 2020-08-30 DIAGNOSIS — Z940 Kidney transplant status: Principal | ICD-10-CM

## 2020-09-15 NOTE — Unmapped (Signed)
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     Was a Nurse, learning disability used for this call? No    Completed refill call assessment today to schedule patient's medication shipment from the [facility] [phone]).       Specialty medication(s) and dose(s) confirmed: Regimen is correct and unchanged.   Changes to medications: Tenille reports no changes at this time.  Changes to insurance: No  Questions for the pharmacist: No    Confirmed patient received Welcome Packet with first shipment. The patient will receive a drug information handout for each medication shipped and additional FDA Medication Guides as required.       DISEASE/MEDICATION-SPECIFIC INFORMATION        N/A    SPECIALTY MEDICATION ADHERENCE     Medication Adherence    Patient reported X missed doses in the last month: 0  Specialty Medication: myfortic 180mg   Adherence tools used: patient uses a pill box to manage medications                Myfortic 180mg   : 7 days of medicine on hand         SHIPPING     Shipping address confirmed in Epic.     Delivery Scheduled: Yes, Expected medication delivery date: 11/23.     Medication will be delivered via UPS to the prescription address in Epic WAM.    [name]   [facility] Specialty Technician

## 2020-09-20 MED FILL — MYFORTIC 180 MG TABLET,DELAYED RELEASE: ORAL | 30 days supply | Qty: 120 | Fill #9

## 2020-09-20 MED FILL — MYFORTIC 180 MG TABLET,DELAYED RELEASE: 30 days supply | Qty: 120 | Fill #9 | Status: AC

## 2020-09-21 DIAGNOSIS — Z79899 Other long term (current) drug therapy: Principal | ICD-10-CM

## 2020-09-21 DIAGNOSIS — E119 Type 2 diabetes mellitus without complications: Principal | ICD-10-CM

## 2020-09-21 DIAGNOSIS — Z940 Kidney transplant status: Principal | ICD-10-CM

## 2020-09-22 ENCOUNTER — Ambulatory Visit: Admit: 2020-09-22 | Discharge: 2020-09-23 | Payer: MEDICARE | Attending: Nephrology | Primary: Nephrology

## 2020-09-22 ENCOUNTER — Ambulatory Visit: Admit: 2020-09-22 | Discharge: 2020-09-23 | Payer: MEDICARE

## 2020-09-22 DIAGNOSIS — Z992 Dependence on renal dialysis: Principal | ICD-10-CM

## 2020-09-22 DIAGNOSIS — N186 End stage renal disease: Principal | ICD-10-CM

## 2020-09-22 DIAGNOSIS — K7030 Alcoholic cirrhosis of liver without ascites: Principal | ICD-10-CM

## 2020-09-22 DIAGNOSIS — I120 Hypertensive chronic kidney disease with stage 5 chronic kidney disease or end stage renal disease: Principal | ICD-10-CM

## 2020-09-22 DIAGNOSIS — E119 Type 2 diabetes mellitus without complications: Principal | ICD-10-CM

## 2020-09-22 DIAGNOSIS — Z940 Kidney transplant status: Principal | ICD-10-CM

## 2020-09-22 DIAGNOSIS — Z79899 Other long term (current) drug therapy: Principal | ICD-10-CM

## 2020-09-22 DIAGNOSIS — M25562 Pain in left knee: Principal | ICD-10-CM

## 2020-09-22 DIAGNOSIS — Z7952 Long term (current) use of systemic steroids: Principal | ICD-10-CM

## 2020-09-22 DIAGNOSIS — D849 Immunodeficiency, unspecified: Principal | ICD-10-CM

## 2020-09-22 DIAGNOSIS — K5730 Diverticulosis of large intestine without perforation or abscess without bleeding: Principal | ICD-10-CM

## 2020-09-22 DIAGNOSIS — E1122 Type 2 diabetes mellitus with diabetic chronic kidney disease: Principal | ICD-10-CM

## 2020-09-22 DIAGNOSIS — Z7982 Long term (current) use of aspirin: Principal | ICD-10-CM

## 2020-09-22 DIAGNOSIS — M7981 Nontraumatic hematoma of soft tissue: Principal | ICD-10-CM

## 2020-09-22 DIAGNOSIS — Z944 Liver transplant status: Principal | ICD-10-CM

## 2020-09-22 DIAGNOSIS — Z79891 Long term (current) use of opiate analgesic: Principal | ICD-10-CM

## 2020-09-22 DIAGNOSIS — M25561 Pain in right knee: Principal | ICD-10-CM

## 2020-09-22 DIAGNOSIS — Z96651 Presence of right artificial knee joint: Principal | ICD-10-CM

## 2020-09-22 DIAGNOSIS — K5792 Diverticulitis of intestine, part unspecified, without perforation or abscess without bleeding: Principal | ICD-10-CM

## 2020-09-22 LAB — CBC W/ AUTO DIFF
BASOPHILS ABSOLUTE COUNT: 0 10*9/L (ref 0.0–0.1)
BASOPHILS RELATIVE PERCENT: 0.4 %
EOSINOPHILS ABSOLUTE COUNT: 0.1 10*9/L (ref 0.0–0.7)
EOSINOPHILS RELATIVE PERCENT: 2.2 %
HEMATOCRIT: 41.3 % (ref 35.0–44.0)
HEMOGLOBIN: 12.9 g/dL (ref 12.0–15.5)
LYMPHOCYTES ABSOLUTE COUNT: 1.8 10*9/L (ref 0.7–4.0)
LYMPHOCYTES RELATIVE PERCENT: 33.1 %
MEAN CORPUSCULAR HEMOGLOBIN CONC: 31.3 g/dL (ref 30.0–36.0)
MEAN CORPUSCULAR HEMOGLOBIN: 25.9 pg — ABNORMAL LOW (ref 26.0–34.0)
MEAN CORPUSCULAR VOLUME: 82.7 fL (ref 82.0–98.0)
MEAN PLATELET VOLUME: 8.8 fL (ref 7.0–10.0)
MONOCYTES ABSOLUTE COUNT: 0.4 10*9/L (ref 0.1–1.0)
MONOCYTES RELATIVE PERCENT: 7.7 %
NEUTROPHILS ABSOLUTE COUNT: 3.1 10*9/L (ref 1.7–7.7)
NEUTROPHILS RELATIVE PERCENT: 56.6 %
NUCLEATED RED BLOOD CELLS: 0 /100{WBCs} (ref <=4)
PLATELET COUNT: 313 10*9/L (ref 150–450)
RED BLOOD CELL COUNT: 5 10*12/L (ref 3.90–5.03)
RED CELL DISTRIBUTION WIDTH: 15.4 % — ABNORMAL HIGH (ref 12.0–15.0)
WBC ADJUSTED: 5.5 10*9/L (ref 3.5–10.5)

## 2020-09-22 LAB — LIPID PANEL
CHOLESTEROL/HDL RATIO SCREEN: 4.8 — ABNORMAL HIGH (ref 1.0–4.5)
CHOLESTEROL: 189 mg/dL (ref <=200)
HDL CHOLESTEROL: 39 mg/dL — ABNORMAL LOW (ref 40–60)
LDL CHOLESTEROL CALCULATED: 125 mg/dL — ABNORMAL HIGH (ref 40–99)
NON-HDL CHOLESTEROL: 150 mg/dL — ABNORMAL HIGH (ref 70–130)
TRIGLYCERIDES: 126 mg/dL (ref 0–150)
VLDL CHOLESTEROL CAL: 25.2 mg/dL (ref 11–41)

## 2020-09-22 LAB — HEMOGLOBIN A1C
ESTIMATED AVERAGE GLUCOSE: 123 mg/dL
HEMOGLOBIN A1C: 5.9 % — ABNORMAL HIGH (ref 4.8–5.6)

## 2020-09-22 LAB — PROTEIN / CREATININE RATIO, URINE
CREATININE, URINE: 53.1 mg/dL
PROTEIN URINE: <6 mg/dL

## 2020-09-22 LAB — URINALYSIS
BACTERIA: NONE SEEN /HPF
BILIRUBIN UA: NEGATIVE
BLOOD UA: NEGATIVE
GLUCOSE UA: NEGATIVE
KETONES UA: NEGATIVE
NITRITE UA: NEGATIVE
PH UA: 7 (ref 5.0–9.0)
PROTEIN UA: NEGATIVE
RBC UA: <1 /HPF (ref <4)
SPECIFIC GRAVITY UA: 1.01 (ref 1.005–1.030)
SQUAMOUS EPITHELIAL: <1 /HPF (ref 0–5)
UROBILINOGEN UA: 0.2
WBC UA: <1 /HPF (ref 0–5)

## 2020-09-22 LAB — COMPREHENSIVE METABOLIC PANEL
ALBUMIN: 4.1 g/dL (ref 3.4–5.0)
ALKALINE PHOSPHATASE: 74 U/L (ref 46–116)
ALT (SGPT): 19 U/L (ref 10–49)
ANION GAP: 5 mmol/L (ref 5–14)
AST (SGOT): 24 U/L (ref <=34)
BILIRUBIN TOTAL: 0.6 mg/dL (ref 0.3–1.2)
BLOOD UREA NITROGEN: 9 mg/dL (ref 9–23)
BUN / CREAT RATIO: 14
CALCIUM: 10.1 mg/dL (ref 8.7–10.4)
CHLORIDE: 106 mmol/L (ref 98–107)
CO2: 29.8 mmol/L (ref 20.0–31.0)
CREATININE: 0.63 mg/dL
EGFR CKD-EPI AA FEMALE: >90 mL/min/{1.73_m2} (ref >=60)
EGFR CKD-EPI NON-AA FEMALE: >90 mL/min/{1.73_m2} (ref >=60)
GLUCOSE RANDOM: 106 mg/dL — ABNORMAL HIGH (ref 70–99)
POTASSIUM: 4.2 mmol/L (ref 3.4–4.5)
PROTEIN TOTAL: 7.2 g/dL (ref 5.7–8.2)
SODIUM: 141 mmol/L (ref 135–145)

## 2020-09-22 LAB — MAGNESIUM: MAGNESIUM: 1.8 mg/dL (ref 1.6–2.6)

## 2020-09-22 LAB — PHOSPHORUS: PHOSPHORUS: 3.2 mg/dL (ref 2.4–5.1)

## 2020-09-22 NOTE — Unmapped (Signed)
Transplant Nephrology Clinic Visit    Assessment and Plan  Michelle Travis is a 64 y.o. 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 Active medical issues include:    1. Status post kidney transplant. Creatinine today is 0.63 mg/dL (baseline <1.6 mg/dL). UP/C today is normal. Her last dose of Rituximab was 03/2017 with no plans for repeat dosing unless recurrence is confirmed.     2. S/P Liver transplant with excellent liver function. She will continue to follow with the liver transplant team.    3. Immunosuppression. Tacrolimus level is 4.3 on 08/11/20 (not drawn today as pt already took meds) (target 3-6). Patient will continue Envarsus 2 mg daily, prednisone 5 mg daily, and Myfortic 360 mg BID.     4. History of IgG lambda monoclonal gammopathy. SPEP with immunofixation negative.  Serum free light chains not drawn.      5. History of sleep apnea. She is no longer on CPAP. She denies daytime drowsiness.     6. Recent diverticulitis. Symptoms have resolved.     7. Bilateral knee pain. Patient was reassured that if TKR is necessary her transplant status does not represent a contraindication to surgery. Continues on oxycodone as needed.     8. Immunizations. Prevnar 13 and Shingrix vaccine up-to-date. Due for pneumococcal 23 and will get at PCP office.  COVID-19 vaccine x 3 completed. Flu shot completed. She is aware of the limited efficacy of the vaccine.     9. Cancer screening. Mammogram completed 12/2019. PAP smear last year was unremarkable. Colonoscopy 08/07/18 with single 6 mm polyp and diverticulosis.     10. Follow-up. Patient will return to clinic in 1year.         History of Present Illness  Michelle Travis is a 64 y.o. 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  She has no history of rejection or recurrent disease. She has not had a kdiney biopsy since transplant.     She is here today for follow up.  Continues to have knee pain and states she needs a TKR.  Otherwise doing well.  She denies headaches, dizziness, lightheadedness, chest pain, shortness of breath, abdominal pain, n/v/d, edema, tremors, dysuria, hematuria, pain/burning with urination, and odors.     She already took her Tacrolimus today.      Transplant History:  1. 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 S/P kidney and liver transplants on 09/13/17 at [facility]. Kidney KDPI 26%. CMV D+/R+; EBV D-/R+, hep C Ab +/NAT- (recipient hep C Ab negative)  4. Complicated by rectus sheath hematoma  5. Delayed graft function requiring hemodialysis sessions following transplant (stopped dialysis 11/26/018).  6. Immunosuppression was ??? induction followed by tacrolimus and myfortic maintenance therapy.   7. Baseline creatinine <1.0 mg/dL.    Past Medical History:  1. Alcoholic cirrhosis, S/P TIPS  2. Membranous nephropathy, PLA2R positive biopsy 08/30/2016, treated solumedrol initially with little improvement then with Rituximab (10/2016 and 03/2017).   3. S/P kidney and liver transplants as stated above  4. History of breast cancer, in remission for 17 years, s/p lumpectomy.  5. History of IGG monoclonal gammopathy, IgG lambda. Kidney biopsy 08/30/2016 without MGRS.  6. S/p cerebral aneurysm repair.  7. S/P total knee replacement, right   8. Essential HTN prior to ESLD  9. History of  diverticulitis  10. Recurrent pleural effusion, history of pneumonia x 3  11. Osteopenia by DEXA 06/14/2016  12. Hypoattenuating thyroid nodule noted on chest CT 11/11/2016  13. Sleep apnea    Review of Systems    All other systems are reviewed and are negative.    Medications    Current Outpatient Medications   Medication Sig Dispense Refill   ??? atorvastatin (LIPITOR) 10 MG tablet Take 1 tablet (10 mg total) by mouth daily. 30 tablet 8   ??? BELBUCA 300 mcg buccal film Apply 1 each to cheek daily.     ??? cholecalciferol, vitamin D3, 1,000 unit (25 mcg) tablet Take 1,000 Units by mouth daily.     ??? ENVARSUS XR 1 mg Tb24 extended release tablet Take 2 tablets (2 mg total) by mouth daily. 180 tablet 3   ??? MYFORTIC 180 mg EC tablet Take 2 tablets (360 mg total) by mouth two (2) times a day. 120 tablet 11   ??? oxyCODONE (ROXICODONE) 10 mg immediate release tablet TAKE 1 (ONE) TABLET BY MOUTH FOUR TIMES DAILY, AS NEEDED     ??? pantoprazole (PROTONIX) 40 MG tablet Take 1 tablet (40 mg total) by mouth daily. 30 tablet 0   ??? aspirin (ECOTRIN) 81 MG tablet Take 1 tablet (81 mg total) by mouth daily. 30 tablet 11     No current facility-administered medications for this visit.       Physical Exam    BP 121/83 (BP Site: R Arm, BP Position: Sitting, BP Cuff Size: Large)  - Pulse 57  - Temp 36.4 ??C (97.6 ??F) (Temporal)  - Wt 86.4 kg (190 lb 6.4 oz)  - LMP  (LMP Unknown)  - BMI 35.98 kg/m??   General: Patient is a pleasant female in no apparent distress.  Eyes: Sclera anicteric.  Neck: Supple without LAD/JVD/bruits.  Lungs: Clear to auscultation bilaterally, no wheezes/rales/rhonchi.  Cardiovascular: Regular rate and rhythm without murmurs, rubs or gallops.  Abdomen: Soft, notender/nondistended. Positive bowel sounds. No hepatosplenomegaly, masses or bruits appreciated.   Extremities: Without edema, joints without evidence of synovitis  Skin: Without rash  Neurological: Grossly nonfocal.  Psychiatric: Mood and affect appropriate.    Laboratory Results    No results found for this or any previous visit (from the past 170 hour(s)).

## 2020-09-22 NOTE — Unmapped (Signed)
Transplant Coordinator, Clinic Visit   Pt seen today by transplant nephrology for follow up, reviewed medications and symptoms. Pt states she          09/22/20 1115   BP: 121/83   Pulse: 57   Temp: 36.4 ??C (97.6 ??F)   Weight: 86.4 kg (190 lb 6.4 oz)       Assessment  BP: 120/80s at home  Lightheaded: denies  Headache: denies  Hand tremors: denies  Numbness/tingling: denies  Fevers: denies  Chills/sweats: denies  Shortness of breath: denies  Chest pain or pressure: denies  Palpitations: denies  Abdominal pain: denies  Heart burn: denies   Nausea/vomiting: denies  Diarrhea/constipation: denies  UTI symptoms: denies  Swelling: denies  Pain: pain in LEFT knee, taking Oxycodone PRN    Good appetite; reports adequate hydration.     Any new medications? no  Immunosuppressant last taken: this morning     Immunization status: due for Pneumovax 23    Functional Score: 80    Normal activity with efforts; some signs or symptoms of disease.     I spent a total of 10 minutes with [name] reviewing medications and symptoms.

## 2020-09-22 NOTE — Unmapped (Signed)
AOBP performed R  arm, large cuff.  Average - 121/83, p 57   1st reading - 120/83, p 58   2nd reading - 125/85, p 57    3rd reading- 119/81, p 56

## 2020-09-22 NOTE — Unmapped (Signed)
Patient labs drawn in room prior to leaving Nephrology clinic

## 2020-09-23 LAB — CMV DNA, QUANTITATIVE, PCR: CMV VIRAL LD: NOT DETECTED

## 2020-09-23 LAB — BK VIRUS QUANTITATIVE PCR, BLOOD: BK BLOOD RESULT: NOT DETECTED

## 2020-09-24 LAB — EBV QUANTITATIVE PCR, BLOOD: EBV VIRAL LOAD RESULT: NOT DETECTED

## 2020-09-27 DIAGNOSIS — Z944 Liver transplant status: Principal | ICD-10-CM

## 2020-09-27 DIAGNOSIS — K769 Liver disease, unspecified: Principal | ICD-10-CM

## 2020-09-27 DIAGNOSIS — Z79899 Other long term (current) drug therapy: Principal | ICD-10-CM

## 2020-09-27 DIAGNOSIS — Z940 Kidney transplant status: Principal | ICD-10-CM

## 2020-09-28 LAB — VITAMIN D 25 HYDROXY: VITAMIN D, TOTAL (25OH): 34.9 ng/mL (ref 20.0–80.0)

## 2020-10-04 LAB — HLA DS POST TRANSPLANT
ANTI-DONOR HLA-A #1 MFI: 0 MFI
ANTI-DONOR HLA-A #2 MFI: 0 MFI
ANTI-DONOR HLA-B #1 MFI: 0 MFI
ANTI-DONOR HLA-B #2 MFI: 0 MFI
ANTI-DONOR HLA-C #1 MFI: 0 MFI
ANTI-DONOR HLA-C #2 MFI: 0 MFI
ANTI-DONOR HLA-DP AG #1 MFI: 490 MFI
ANTI-DONOR HLA-DQB #1 MFI: 45 MFI
ANTI-DONOR HLA-DQB #2 MFI: 269 MFI
ANTI-DONOR HLA-DR #1 MFI: 345 MFI
ANTI-DONOR HLA-DR #2 MFI: 122 MFI

## 2020-10-04 LAB — FSAB CLASS 2 ANTIBODY SPECIFICITY: HLA CL2 AB RESULT: POSITIVE

## 2020-10-04 LAB — FSAB CLASS 1 ANTIBODY SPECIFICITY: HLA CLASS 1 ANTIBODY RESULT: NEGATIVE

## 2020-10-13 NOTE — Unmapped (Signed)
[facility] Pharm[facility] Refill Coordination Note    Specialty Medication(s) to be Shipped:   Transplant: Myfortic 180mg     Other medication(s) to be shipped: No additional medications requested for fill at this time     ARNESHA SCHIRALDI, DOB: 23-Jun-1956  Phone: [phone] (home) [phone] (work)      All above HIPAA information was verified with patient.     Was a Nurse, learning disability used for this call? No    Completed refill call assessment today to schedule patient's medication shipment from the [facility] [phone]).       Specialty medication(s) and dose(s) confirmed: Regimen is correct and unchanged.   Changes to medications: Kenedie reports no changes at this time.  Changes to insurance: No  Questions for the pharmacist: No    Confirmed patient received Welcome Packet with first shipment. The patient will receive a drug information handout for each medication shipped and additional FDA Medication Guides as required.       DISEASE/MEDICATION-SPECIFIC INFORMATION        N/A    SPECIALTY MEDICATION ADHERENCE     Medication Adherence    Patient reported X missed doses in the last month: 0  Specialty Medication: Myfortic 180mg   Patient is on additional specialty medications: No  Adherence tools used: patient uses a pill box to manage medications       Myfortic 180 mg: 10 days of medicine on hand     SHIPPING     Shipping address confirmed in Epic.     Delivery Scheduled: Yes, Expected medication delivery date: 10/20/2020.     Medication will be delivered via UPS to the prescription address in Epic WAM.    [name] [facility] Specialty Technician

## 2020-10-19 MED FILL — MYFORTIC 180 MG TABLET,DELAYED RELEASE: ORAL | 30 days supply | Qty: 120 | Fill #10

## 2020-10-19 MED FILL — MYFORTIC 180 MG TABLET,DELAYED RELEASE: 30 days supply | Qty: 120 | Fill #10 | Status: AC

## 2020-10-25 DIAGNOSIS — Z940 Kidney transplant status: Principal | ICD-10-CM

## 2020-10-25 DIAGNOSIS — Z944 Liver transplant status: Principal | ICD-10-CM

## 2020-10-25 DIAGNOSIS — K769 Liver disease, unspecified: Principal | ICD-10-CM

## 2020-10-25 DIAGNOSIS — Z79899 Other long term (current) drug therapy: Principal | ICD-10-CM

## 2020-11-08 NOTE — Unmapped (Signed)
Pt left NP Martin-Velez a vm inquiring who her primary TNC is. Called pt to introduce myself and provide contact info. Pt plans to obtain labs on Wednesday and requests a call to discuss labs. Advised pt to notify primary TNC of any issues that may arise. Pt verbalized understanding.

## 2020-11-10 NOTE — Unmapped (Signed)
[facility] Pharmacy Refill Coordination Note    Specialty Medication(s) to be Shipped:   Transplant: Myfortic 180mg     Other medication(s) to be shipped: No additional medications requested for fill at this time     Michelle Travis, DOB: 26-Dec-1955  Phone: [phone] (home) [phone] (work)      All above HIPAA information was verified with patient.     Was a Nurse, learning disability used for this call? No    Completed refill call assessment today to schedule patient's medication shipment from the [facility] [phone]).       Specialty medication(s) and dose(s) confirmed: Regimen is correct and unchanged.   Changes to medications: Alana reports no changes at this time.  Changes to insurance: No  Questions for the pharmacist: No    Confirmed patient received Welcome Packet with first shipment. The patient will receive a drug information handout for each medication shipped and additional FDA Medication Guides as required.       DISEASE/MEDICATION-SPECIFIC INFORMATION        N/A    SPECIALTY MEDICATION ADHERENCE     Medication Adherence    Patient reported X missed doses in the last month: 0  Specialty Medication: Myfortic 180mg   Patient is on additional specialty medications: No  Adherence tools used: patient uses a pill box to manage medications                Myfortic 180 mg: 8 days of medicine on hand        SHIPPING     Shipping address confirmed in Epic.     Delivery Scheduled: Yes, Expected medication delivery date: 11/17/20.     Medication will be delivered via UPS to the prescription address in Epic WAM.    [name]   [facility] Specialty Pharmacist

## 2020-11-11 LAB — COMPREHENSIVE METABOLIC PANEL
A/G RATIO: 1.6 (ref 1.2–2.2)
ALBUMIN: 4.2 g/dL (ref 3.8–4.8)
ALKALINE PHOSPHATASE: 75 IU/L (ref 44–121)
ALT (SGPT): 16 IU/L (ref 0–32)
AST (SGOT): 19 IU/L (ref 0–40)
BILIRUBIN TOTAL: 0.4 mg/dL (ref 0.0–1.2)
BLOOD UREA NITROGEN: 13 mg/dL (ref 8–27)
BUN / CREAT RATIO: 17 (ref 12–28)
CALCIUM: 9.7 mg/dL (ref 8.7–10.3)
CHLORIDE: 103 mmol/L (ref 96–106)
CO2: 24 mmol/L (ref 20–29)
CREATININE: 0.77 mg/dL (ref 0.57–1.00)
GLOBULIN, TOTAL: 2.6 g/dL (ref 1.5–4.5)
GLUCOSE: 77 mg/dL (ref 65–99)
POTASSIUM: 4.6 mmol/L (ref 3.5–5.2)
SODIUM: 140 mmol/L (ref 134–144)
TOTAL PROTEIN: 6.8 g/dL (ref 6.0–8.5)

## 2020-11-11 LAB — CBC W/ DIFFERENTIAL
BANDED NEUTROPHILS ABSOLUTE COUNT: 0 10*3/uL (ref 0.0–0.1)
BASOPHILS ABSOLUTE COUNT: 0 10*3/uL (ref 0.0–0.2)
BASOPHILS RELATIVE PERCENT: 1 %
EOSINOPHILS ABSOLUTE COUNT: 0.2 10*3/uL (ref 0.0–0.4)
EOSINOPHILS RELATIVE PERCENT: 3 %
HEMATOCRIT: 41.7 % (ref 34.0–46.6)
HEMOGLOBIN: 12.8 g/dL (ref 11.1–15.9)
IMMATURE GRANULOCYTES: 0 %
LYMPHOCYTES ABSOLUTE COUNT: 1.9 10*3/uL (ref 0.7–3.1)
LYMPHOCYTES RELATIVE PERCENT: 32 %
MEAN CORPUSCULAR HEMOGLOBIN CONC: 30.7 g/dL — ABNORMAL LOW (ref 31.5–35.7)
MEAN CORPUSCULAR HEMOGLOBIN: 25.9 pg — ABNORMAL LOW (ref 26.6–33.0)
MEAN CORPUSCULAR VOLUME: 84 fL (ref 79–97)
MONOCYTES ABSOLUTE COUNT: 0.6 10*3/uL (ref 0.1–0.9)
MONOCYTES RELATIVE PERCENT: 10 %
NEUTROPHILS ABSOLUTE COUNT: 3.1 10*3/uL (ref 1.4–7.0)
NEUTROPHILS RELATIVE PERCENT: 54 %
PLATELET COUNT: 318 10*3/uL (ref 150–450)
RED BLOOD CELL COUNT: 4.94 x10E6/uL (ref 3.77–5.28)
RED CELL DISTRIBUTION WIDTH: 14.1 % (ref 11.7–15.4)
WHITE BLOOD CELL COUNT: 5.8 10*3/uL (ref 3.4–10.8)

## 2020-11-11 LAB — GAMMA GT: GAMMA GLUTAMYL TRANSFERASE: 38 IU/L (ref 0–60)

## 2020-11-11 LAB — PHOSPHORUS: PHOSPHORUS, SERUM: 3 mg/dL (ref 3.0–4.3)

## 2020-11-11 LAB — MAGNESIUM: MAGNESIUM: 1.5 mg/dL — ABNORMAL LOW (ref 1.6–2.3)

## 2020-11-11 LAB — BILIRUBIN, DIRECT: BILIRUBIN DIRECT: 0.11 mg/dL (ref 0.00–0.40)

## 2020-11-13 LAB — TACROLIMUS LEVEL: TACROLIMUS BLOOD: 3.2 ng/mL (ref 2.0–20.0)

## 2020-11-16 MED FILL — MYFORTIC 180 MG TABLET,DELAYED RELEASE: ORAL | 30 days supply | Qty: 120 | Fill #11

## 2020-11-19 DIAGNOSIS — Z940 Kidney transplant status: Principal | ICD-10-CM

## 2020-11-19 NOTE — Unmapped (Signed)
Called pt to discuss 1/12 lab results which are stable. Pt very appreciative of call, understands to call primary or covering TNC for any issues.

## 2020-11-22 DIAGNOSIS — Z940 Kidney transplant status: Principal | ICD-10-CM

## 2020-11-22 DIAGNOSIS — K769 Liver disease, unspecified: Principal | ICD-10-CM

## 2020-11-22 DIAGNOSIS — Z944 Liver transplant status: Principal | ICD-10-CM

## 2020-11-22 DIAGNOSIS — Z79899 Other long term (current) drug therapy: Principal | ICD-10-CM

## 2020-12-08 DIAGNOSIS — Z944 Liver transplant status: Principal | ICD-10-CM

## 2020-12-08 DIAGNOSIS — Z79899 Other long term (current) drug therapy: Principal | ICD-10-CM

## 2020-12-08 MED ORDER — MYFORTIC 180 MG TABLET,DELAYED RELEASE
ORAL_TABLET | Freq: Two times a day (BID) | ORAL | 11 refills | 30 days | Status: CP
Start: 2020-12-08 — End: 2021-12-08
  Filled 2020-12-14: qty 120, 30d supply, fill #0

## 2020-12-08 NOTE — Unmapped (Signed)
[facility] Refill Coordination Note    Specialty Medication(s) to be Shipped:   Transplant: Myfortic 180mg     Other medication(s) to be shipped: No additional medications requested for fill at this time     Michelle Travis, DOB: 07-13-1956  Phone: [phone] (home) [phone] (work)      All above HIPAA information was verified with patient.     Was a Nurse, learning disability used for this call? No    Completed refill call assessment today to schedule patient's medication shipment from the [facility] [phone]).       Specialty medication(s) and dose(s) confirmed: Regimen is correct and unchanged.   Changes to medications: Michelle Travis reports no changes at this time.  Changes to insurance: No  Questions for the pharmacist: No    Confirmed patient received Welcome Packet with first shipment. The patient will receive a drug information handout for each medication shipped and additional FDA Medication Guides as required.       DISEASE/MEDICATION-SPECIFIC INFORMATION        N/A    SPECIALTY MEDICATION ADHERENCE     Medication Adherence    Patient reported X missed doses in the last month: 0  Specialty Medication: Myfortic 180mg   Patient is on additional specialty medications: No  Adherence tools used: patient uses a pill box to manage medications        Myfortic 180 mg: 9 days of medicine on hand     SHIPPING     Shipping address confirmed in Epic.     Delivery Scheduled: Yes, Expected medication delivery date: 12/15/2020.  However, Rx request for refills was sent to the provider as there are none remaining.     Medication will be delivered via UPS to the prescription address in Epic WAM.    Michelle Travis [facility] Specialty Technician

## 2020-12-08 NOTE — Unmapped (Signed)
Patient has requested a medication refill via EPIC

## 2020-12-14 NOTE — Unmapped (Signed)
Encounter created to cancel standing lab orders.

## 2020-12-17 DIAGNOSIS — Z79899 Other long term (current) drug therapy: Principal | ICD-10-CM

## 2020-12-17 DIAGNOSIS — Z944 Liver transplant status: Principal | ICD-10-CM

## 2020-12-17 NOTE — Unmapped (Signed)
Updated checklist for annual appt.

## 2020-12-17 NOTE — Unmapped (Signed)
Standing labs entered per new protocol.

## 2020-12-30 LAB — COMPREHENSIVE METABOLIC PANEL
A/G RATIO: 1.7 (ref 1.2–2.2)
ALBUMIN: 4.5 g/dL (ref 3.8–4.8)
ALKALINE PHOSPHATASE: 73 IU/L (ref 44–121)
ALT (SGPT): 18 IU/L (ref 0–32)
AST (SGOT): 21 IU/L (ref 0–40)
BILIRUBIN TOTAL: 0.4 mg/dL (ref 0.0–1.2)
BLOOD UREA NITROGEN: 11 mg/dL (ref 8–27)
BUN / CREAT RATIO: 17 (ref 12–28)
CALCIUM: 9.5 mg/dL (ref 8.7–10.3)
CHLORIDE: 103 mmol/L (ref 96–106)
CO2: 22 mmol/L (ref 20–29)
CREATININE: 0.65 mg/dL (ref 0.57–1.00)
GLOBULIN, TOTAL: 2.7 g/dL (ref 1.5–4.5)
GLUCOSE: 99 mg/dL (ref 65–99)
POTASSIUM: 4.4 mmol/L (ref 3.5–5.2)
SODIUM: 141 mmol/L (ref 134–144)
TOTAL PROTEIN: 7.2 g/dL (ref 6.0–8.5)

## 2020-12-30 LAB — CBC W/ DIFFERENTIAL
BANDED NEUTROPHILS ABSOLUTE COUNT: 0 10*3/uL (ref 0.0–0.1)
BASOPHILS ABSOLUTE COUNT: 0 10*3/uL (ref 0.0–0.2)
BASOPHILS RELATIVE PERCENT: 1 %
EOSINOPHILS ABSOLUTE COUNT: 0.2 10*3/uL (ref 0.0–0.4)
EOSINOPHILS RELATIVE PERCENT: 4 %
HEMATOCRIT: 43.9 % (ref 34.0–46.6)
HEMOGLOBIN: 13.6 g/dL (ref 11.1–15.9)
IMMATURE GRANULOCYTES: 0 %
LYMPHOCYTES ABSOLUTE COUNT: 1.8 10*3/uL (ref 0.7–3.1)
LYMPHOCYTES RELATIVE PERCENT: 37 %
MEAN CORPUSCULAR HEMOGLOBIN CONC: 31 g/dL — ABNORMAL LOW (ref 31.5–35.7)
MEAN CORPUSCULAR HEMOGLOBIN: 26.3 pg — ABNORMAL LOW (ref 26.6–33.0)
MEAN CORPUSCULAR VOLUME: 85 fL (ref 79–97)
MONOCYTES ABSOLUTE COUNT: 0.4 10*3/uL (ref 0.1–0.9)
MONOCYTES RELATIVE PERCENT: 9 %
NEUTROPHILS ABSOLUTE COUNT: 2.5 10*3/uL (ref 1.4–7.0)
NEUTROPHILS RELATIVE PERCENT: 49 %
PLATELET COUNT: 351 10*3/uL (ref 150–450)
RED BLOOD CELL COUNT: 5.18 x10E6/uL (ref 3.77–5.28)
RED CELL DISTRIBUTION WIDTH: 14.2 % (ref 11.7–15.4)
WHITE BLOOD CELL COUNT: 5 10*3/uL (ref 3.4–10.8)

## 2020-12-30 LAB — MAGNESIUM: MAGNESIUM: 1.8 mg/dL (ref 1.6–2.3)

## 2020-12-30 LAB — GAMMA GT: GAMMA GLUTAMYL TRANSFERASE: 47 IU/L (ref 0–60)

## 2020-12-30 LAB — PHOSPHORUS: PHOSPHORUS, SERUM: 2.5 mg/dL — ABNORMAL LOW (ref 3.0–4.3)

## 2020-12-30 LAB — BILIRUBIN, DIRECT: BILIRUBIN DIRECT: 0.11 mg/dL (ref 0.00–0.40)

## 2020-12-30 NOTE — Unmapped (Signed)
Called patient to discuss scheduling liver transplant annual appointments. Appointments scheduled as per patients preferred date and time. Patient stated verbal understanding of all 02/28/21 appointments. Appt letter mailed to patient via Epic letter.

## 2021-01-01 LAB — TACROLIMUS LEVEL: TACROLIMUS BLOOD: 3.2 ng/mL (ref 2.0–20.0)

## 2021-01-03 NOTE — Unmapped (Signed)
Called pt to review 3/2 stable labs, left vm and contact info in case pt has questions.

## 2021-01-10 DIAGNOSIS — Z79899 Other long term (current) drug therapy: Principal | ICD-10-CM

## 2021-01-10 DIAGNOSIS — Z944 Liver transplant status: Principal | ICD-10-CM

## 2021-01-10 NOTE — Unmapped (Signed)
[facility] Clinical Assessment & Refill Coordination Note    Michelle Travis, DOB: 1956/03/02  Phone: [phone] (home) [phone] (work)    All above HIPAA information was verified with patient.     Was a Nurse, learning disability used for this call? No    Specialty Medication(s):   Transplant: Myfortic 180mg      Current Outpatient Medications   Medication Sig Dispense Refill   ??? aspirin (ECOTRIN) 81 MG tablet Take 1 tablet (81 mg total) by mouth daily. 30 tablet 11   ??? atorvastatin (LIPITOR) 10 MG tablet Take 1 tablet (10 mg total) by mouth daily. 30 tablet 8   ??? BELBUCA 300 mcg buccal film Apply 1 each to cheek daily.     ??? cholecalciferol, vitamin D3, 1,000 unit (25 mcg) tablet Take 1,000 Units by mouth daily.     ??? ENVARSUS XR 1 mg Tb24 extended release tablet Take 2 tablets (2 mg total) by mouth daily. 180 tablet 3   ??? MYFORTIC 180 mg EC tablet Take 2 tablets (360 mg total) by mouth two (2) times a day. 120 tablet 11   ??? oxyCODONE (ROXICODONE) 10 mg immediate release tablet TAKE 1 (ONE) TABLET BY MOUTH FOUR TIMES DAILY, AS NEEDED     ??? pantoprazole (PROTONIX) 40 MG tablet Take 1 tablet (40 mg total) by mouth daily. 30 tablet 0     No current facility-administered medications for this visit.        Changes to medications: Pattie reports no changes at this time.    Allergies   Allergen Reactions   ??? Latex Swelling and Rash     Discoloration of skin.   Discoloration of skin.    ??? Hydrocodone Itching   ??? Hydrocodone-Acetaminophen Itching       Changes to allergies: No    SPECIALTY MEDICATION ADHERENCE     Myfortic 180mg   : 9 days of medicine on hand       Medication Adherence    Patient reported X missed doses in the last month: 0  Specialty Medication: myfortic 180mg   Adherence tools used: patient uses a pill box to manage medications          Specialty medication(s) dose(s) confirmed: Regimen is correct and unchanged.     Are there any concerns with adherence? No    Adherence counseling provided? Not needed    CLINICAL MANAGEMENT AND INTERVENTION      Clinical Benefit Assessment:    Do you feel the medicine is effective or helping your condition? Yes    Clinical Benefit counseling provided? Not needed    Adverse Effects Assessment:    Are you experiencing any side effects? No    Are you experiencing difficulty administering your medicine? No    Quality of Life Assessment:    How many days over the past month did your transplant  keep you from your normal activities? For example, brushing your teeth or getting up in the morning. 0    Have you discussed this with your provider? Not needed    Therapy Appropriateness:    Is therapy appropriate? Yes, therapy is appropriate and should be continued    DISEASE/MEDICATION-SPECIFIC INFORMATION      N/A    PATIENT SPECIFIC NEEDS     - Does the patient have any physical, cognitive, or cultural barriers? No    - Is the patient high risk? Yes, patient is taking a REMS drug. Medication is dispensed in compliance with REMS  program    - Does the patient require a Care Management Plan? No     - Does the patient require physician intervention or other additional services (i.e. nutrition, smoking cessation, social work)? No      SHIPPING     Specialty Medication(s) to be Shipped:   Transplant: Myfortic 180mg     Other medication(s) to be shipped: No additional medications requested for fill at this time     Changes to insurance: No    Delivery Scheduled: Yes, Expected medication delivery date: 01/14/2021.     Medication will be delivered via UPS to the confirmed prescription address in [facility].    The patient will receive a drug information handout for each medication shipped and additional FDA Medication Guides as required.  Verified that patient has previously received a Conservation officer, historic buildings.    All of the patient's questions and concerns have been addressed.    [name]   [facility] Specialty Pharmacist

## 2021-01-13 MED FILL — MYFORTIC 180 MG TABLET,DELAYED RELEASE: ORAL | 30 days supply | Qty: 120 | Fill #1

## 2021-02-03 NOTE — Unmapped (Signed)
[facility] Refill Coordination Note    Specialty Medication(s) to be Shipped:   Transplant: Myfortic 180mg     Other medication(s) to be shipped: No additional medications requested for fill at this time     Michelle Travis, DOB: Jul 28, 1956  Phone: [phone] (home) [phone] (work)      All above HIPAA information was verified with patient.     Was a Nurse, learning disability used for this call? No    Completed refill call assessment today to schedule patient's medication shipment from the [facility] [phone]).       Specialty medication(s) and dose(s) confirmed: Regimen is correct and unchanged.   Changes to medications: Michelle Travis reports no changes at this time.  Changes to insurance: No  Questions for the pharmacist: No    Confirmed patient received a Conservation officer, historic buildings and a Surveyor, mining with first shipment. The patient will receive a drug information handout for each medication shipped and additional FDA Medication Guides as required.       DISEASE/MEDICATION-SPECIFIC INFORMATION        N/A    SPECIALTY MEDICATION ADHERENCE     Medication Adherence    Patient reported X missed doses in the last month: 0  Specialty Medication: myfortic 180mg   Patient is on additional specialty medications: No  Patient is on more than two specialty medications: No  Any gaps in refill history greater than 2 weeks in the last 3 months: no  Demonstrates understanding of importance of adherence: yes  Informant: patient  Reliability of informant: reliable  Provider-estimated medication adherence level: good  Patient is at risk for Non-Adherence: No  Adherence tools used: patient uses a pill box to manage medications                myfortic 180 mg: 10 days of medicine on hand         SHIPPING     Shipping address confirmed in Epic.     Delivery Scheduled: Yes, Expected medication delivery date: 04/13.     Medication will be delivered via UPS to the prescription address in Epic W[facility].    Antonietta Barcelona   [facility] Specialty Technician

## 2021-02-04 DIAGNOSIS — Z944 Liver transplant status: Principal | ICD-10-CM

## 2021-02-04 DIAGNOSIS — Z940 Kidney transplant status: Principal | ICD-10-CM

## 2021-02-04 NOTE — Unmapped (Signed)
Called pt after on-call TNC paged. Pt reports there's bubbles in my pee in the toilet. She denies any pain or hematuria but is very concerned as this new. Informed pt that she may leave a urine sample next time she goes for lab draw to r/out any infection. Pt appreciative of call and plans to go to [facility] on Wednesday. Urinalysis order placed, will route note to kidney TNC as FYI.

## 2021-02-07 DIAGNOSIS — Z79899 Other long term (current) drug therapy: Principal | ICD-10-CM

## 2021-02-07 DIAGNOSIS — Z944 Liver transplant status: Principal | ICD-10-CM

## 2021-02-08 MED FILL — MYFORTIC 180 MG TABLET,DELAYED RELEASE: ORAL | 30 days supply | Qty: 120 | Fill #2

## 2021-02-10 LAB — GAMMA GT: GAMMA GLUTAMYL TRANSFERASE: 42 IU/L (ref 0–60)

## 2021-02-10 LAB — CBC W/ DIFFERENTIAL
BANDED NEUTROPHILS ABSOLUTE COUNT: 0 10*3/uL (ref 0.0–0.1)
BASOPHILS ABSOLUTE COUNT: 0.1 10*3/uL (ref 0.0–0.2)
BASOPHILS RELATIVE PERCENT: 1 %
EOSINOPHILS ABSOLUTE COUNT: 0.2 10*3/uL (ref 0.0–0.4)
EOSINOPHILS RELATIVE PERCENT: 3 %
HEMATOCRIT: 42.9 % (ref 34.0–46.6)
HEMOGLOBIN: 13.3 g/dL (ref 11.1–15.9)
IMMATURE GRANULOCYTES: 0 %
LYMPHOCYTES ABSOLUTE COUNT: 2 10*3/uL (ref 0.7–3.1)
LYMPHOCYTES RELATIVE PERCENT: 30 %
MEAN CORPUSCULAR HEMOGLOBIN CONC: 31 g/dL — ABNORMAL LOW (ref 31.5–35.7)
MEAN CORPUSCULAR HEMOGLOBIN: 26.5 pg — ABNORMAL LOW (ref 26.6–33.0)
MEAN CORPUSCULAR VOLUME: 86 fL (ref 79–97)
MONOCYTES ABSOLUTE COUNT: 0.5 10*3/uL (ref 0.1–0.9)
MONOCYTES RELATIVE PERCENT: 8 %
NEUTROPHILS ABSOLUTE COUNT: 4 10*3/uL (ref 1.4–7.0)
NEUTROPHILS RELATIVE PERCENT: 58 %
PLATELET COUNT: 347 10*3/uL (ref 150–450)
RED BLOOD CELL COUNT: 5.02 x10E6/uL (ref 3.77–5.28)
RED CELL DISTRIBUTION WIDTH: 14 % (ref 11.7–15.4)
WHITE BLOOD CELL COUNT: 6.8 10*3/uL (ref 3.4–10.8)

## 2021-02-10 LAB — COMPREHENSIVE METABOLIC PANEL
A/G RATIO: 1.5 (ref 1.2–2.2)
ALBUMIN: 4.1 g/dL (ref 3.8–4.8)
ALKALINE PHOSPHATASE: 72 IU/L (ref 44–121)
ALT (SGPT): 21 IU/L (ref 0–32)
AST (SGOT): 22 IU/L (ref 0–40)
BILIRUBIN TOTAL: 0.3 mg/dL (ref 0.0–1.2)
BLOOD UREA NITROGEN: 9 mg/dL (ref 8–27)
BUN / CREAT RATIO: 13 (ref 12–28)
CALCIUM: 9.1 mg/dL (ref 8.7–10.3)
CHLORIDE: 104 mmol/L (ref 96–106)
CO2: 25 mmol/L (ref 20–29)
CREATININE: 0.67 mg/dL (ref 0.57–1.00)
GLOBULIN, TOTAL: 2.8 g/dL (ref 1.5–4.5)
GLUCOSE: 100 mg/dL — ABNORMAL HIGH (ref 65–99)
POTASSIUM: 4.5 mmol/L (ref 3.5–5.2)
SODIUM: 143 mmol/L (ref 134–144)
TOTAL PROTEIN: 6.9 g/dL (ref 6.0–8.5)

## 2021-02-10 LAB — MAGNESIUM: MAGNESIUM: 1.6 mg/dL (ref 1.6–2.3)

## 2021-02-10 LAB — PHOSPHORUS: PHOSPHORUS, SERUM: 2.7 mg/dL — ABNORMAL LOW (ref 3.0–4.3)

## 2021-02-10 LAB — BILIRUBIN, DIRECT: BILIRUBIN DIRECT: <0.1 mg/dL (ref 0.00–0.40)

## 2021-02-12 LAB — MICROSCOPIC EXAMINATION
BACTERIA: NONE SEEN
CASTS: NONE SEEN /LPF

## 2021-02-12 LAB — URINALYSIS WITH CULTURE REFLEX
BILIRUBIN UA: NEGATIVE
GLUCOSE UA: NEGATIVE
KETONES UA: NEGATIVE
LEUKOCYTE ESTERASE UA: NEGATIVE
NITRITE UA: NEGATIVE
PH UA: 6 (ref 5.0–7.5)
SPECIFIC GRAVITY UA: 1.02 (ref 1.005–1.030)
UROBILINOGEN UA: 0.2 mg/dL (ref 0.2–1.0)

## 2021-02-12 LAB — TACROLIMUS LEVEL: TACROLIMUS BLOOD: 2.7 ng/mL (ref 2.0–20.0)

## 2021-02-14 NOTE — Unmapped (Addendum)
Patient paged on-call TNC for information about her UA/UCx from last week. She reports she continues to have bubbles in her urine, but denies development of any sx of illness-fever, chills, pain/burning or hematuria. Contacted nephrology provider, Darolyn Rua for recommendations.

## 2021-02-18 MED ORDER — LORATADINE 10 MG TABLET
Freq: Every day | ORAL | 0.00000 days
Start: 2021-02-18 — End: ?

## 2021-02-22 DIAGNOSIS — Z944 Liver transplant status: Principal | ICD-10-CM

## 2021-02-22 DIAGNOSIS — Z79899 Other long term (current) drug therapy: Principal | ICD-10-CM

## 2021-02-22 MED ORDER — ENVARSUS XR 1 MG TABLET,EXTENDED RELEASE
ORAL_TABLET | Freq: Every day | ORAL | 3 refills | 90.00000 days | Status: CP
Start: 2021-02-22 — End: 2022-02-22

## 2021-02-22 NOTE — Unmapped (Signed)
Addended by: Johnna Acosta on: 02/22/2021 02:47 PM     Modules accepted: Orders

## 2021-02-22 NOTE — Unmapped (Addendum)
Envarsus rx renewal to [employer] Envarsus scripted to [facility] for MAP renewal.

## 2021-02-24 ENCOUNTER — Ambulatory Visit
Admit: 2021-02-24 | Discharge: 2021-02-25 | Payer: MEDICARE | Attending: Nurse Practitioner | Primary: Nurse Practitioner

## 2021-02-24 ENCOUNTER — Ambulatory Visit: Admit: 2021-02-24 | Discharge: 2021-02-25 | Payer: MEDICARE

## 2021-02-24 DIAGNOSIS — R0989 Other specified symptoms and signs involving the circulatory and respiratory systems: Principal | ICD-10-CM

## 2021-02-24 MED ORDER — PREDNISONE 10 MG TABLET
ORAL_TABLET | Freq: Every day | ORAL | 0 refills | 5.00000 days | Status: CP
Start: 2021-02-24 — End: 2021-03-01

## 2021-02-24 NOTE — Unmapped (Signed)
Patient Education      Patient Education        Learning About Preventing COVID-19 When You Have a Weakened Immune System  What is coronavirus (COVID-19)?     COVID-19 is a disease caused by a type of coronavirus. This illness was first found in December 2019. It has since spread worldwide.  Coronaviruses are a large group of viruses. They cause the common cold. They also cause more serious illnesses like Middle East respiratory syndrome (MERS) and severe acute respiratory syndrome (SARS). COVID-19 is caused by a novel coronavirus. That means it's a new type that has not been seen in people before.  What causes a weakened immune system?  Your immune system may not work well because of certain medicines used to treat cancer or autoimmune disease. Taking steroid medicines for a long time can also weaken your immune system. Other causes include certain health problems, as well as immune problems that run in your family.  Why is it important to take extra precautions?  A weakened immune system makes it more likely that you'll get very sick from COVID-19. And the COVID vaccine may not work as well for you.  How can you protect yourself?  Follow these guidelines until your doctor tells you it's safe to stop.  ?? Get vaccinated and boosted.  ?? Talk to your doctor about how many doses you need.  ?? Avoid sick people.  ?? Wear a mask if you go into public areas, especially indoor spaces.  ?? Avoid crowds, and try to stay 6 feet apart from other people. If you have to be in a crowded area, wear a mask, even if you're outside.  ?? Wash your hands often.  ?? Avoid touching your mouth, nose, and eyes.  ?? Ask the people you live with or who are in close contact with you to get vaccinated and boosted.  ?? Ask the people you live with to wear a mask in public areas, even if they're fully vaccinated and boosted.  If you think you've been exposed, call your doctor as soon as you can. Your doctor may have you take medicine to help prevent serious illness. Get a COVID-19 test. You may need to be tested more than once.  Where can you learn more?  Go to [facility] at [email or web address] [email or web address] officer in the Menu. Enter C127 in the search box to learn more about Learning About Preventing COVID-19 When You Have a Weakened Immune System.  Current as of: August 04, 2020??????????????????????????????Content Version: 13.2  ?? 2006-2022 [employer].   Care instructions adapted under license by [facility]. If you have questions about a medical condition or this instruction, always ask your healthcare professional. [employer], Inc[employer] disclaims any warranty or liability for your use of this information.         Learning About Coronavirus (COVID-19)  What is coronavirus (COVID-19)?     COVID-19 is a disease caused by a type of coronavirus. This illness was first found in December 2019. It has since spread worldwide.  Coronaviruses are a large group of viruses. They cause the common cold. They also cause more serious illnesses like Middle East respiratory syndrome (MERS) and severe acute respiratory syndrome (SARS). COVID-19 is caused by a novel coronavirus. That means it's a new type that has not been seen in people before.  What are the symptoms?  COVID-19 symptoms may include:  ?? Fever.  ?? Cough.  ?? Trouble breathing.  ?? Chills or  repeated shaking with chills.  ?? Muscle and body aches.  ?? Headache.  ?? Sore throat.  ?? New loss of taste or smell.  ?? Vomiting.  ?? Diarrhea.  In severe cases, COVID-19 can cause pneumonia and make it hard to breathe without help from a machine. It can cause death.  How is it diagnosed?  COVID-19 is diagnosed with a viral test. This may also be called a PCR test or antigen test. It looks for evidence of the virus in your breathing passages or lungs (respiratory system).  The test is most often done on a sample from the nose, throat, or lungs. It's sometimes done on a sample of saliva. One way a sample is collected is by putting a long swab into the back of your nose.  If you have questions about COVID-19 testing, ask your doctor or go to [employer] to use the COVID-19 Viral Testing Tool.  How is it treated?  Mild cases of COVID-19 can be treated at home. Serious cases need treatment in the hospital. Treatment may include medicines to reduce symptoms, plus breathing support such as oxygen therapy or a ventilator. Some people may be placed on their belly to help their oxygen levels.  Treatments that may help people who have COVID-19 include:  Antiviral medicines.  These medicines treat viral infections.  Immune-based therapy.  These medicines help the immune system fight COVID-19. Examples include monoclonal antibodies.  Blood thinners.  These medicines help prevent blood clots. People with severe illness are at risk for blood clots.  How can you protect yourself and others?  ?? Get vaccinated and boosted.  ?? Avoid sick people and stay away from others if you are sick.  ?? Stay at least 6 feet away from other people.  ?? Avoid crowds, especially inside.  ?? Get tested for COVID-19 before you have an indoor visit with people that don't live with you.  ?? Cover your mouth with a tissue when you cough or sneeze.  ?? Wash your hands often, especially after you cough or sneeze. Use soap and water, and scrub for at least 20 seconds. If soap and water aren't available, use an alcohol-based hand sanitizer.  ?? Avoid touching your mouth, nose, and eyes.  Be sure to follow all instructions from the [facility] and your local health authorities. Here are some examples of specific precautions you may need to take.  ?? If you are not fully vaccinated and boosted:  ? Wear a mask if you have to go to public areas.  ?? Even if you're fully vaccinated and boosted, there's still a chance you can get and spread COVID-19. If you live in an area where COVID-19 is spreading quickly, wear a mask if you have to go to indoor public areas. You might also want to wear a mask in crowded outdoor areas as well as public indoor spaces if you:  ? Have certain health conditions.  ? Live with someone who has a compromised immune system.  ? Live with someone who is not fully vaccinated.  ?? If you have been exposed to the virus and are not fully vaccinated and boosted:  ? Talk to your doctor as soon as you can. Your doctor might have you take medicine to help prevent serious illness.  ? Get a COVID-19 test. You may need to be tested more than once.  ? Stay home. Try to separate from other people where you live. Don't go to school, work, or public areas.  ?  Wear a mask around other people for a full 10 days. Avoid travel and stay away from people at high risk for serious illness.  ? Watch for symptoms.  ?? If you have been exposed and you are fully vaccinated and boosted:  ? Talk to your doctor as soon as you can. Your doctor may have you take medicine to help prevent serious illness.  ? Get a COVID-19 test. You may need to be tested more than once.  ? Wear a mask around other people for a full 10 days. Avoid travel and stay away from people at high risk for serious illness.  ? Watch for symptoms.  If you're sick or test positive for COVID-19:  ?? Talk to your doctor as soon as you can. Your doctor may have you take medicine to help prevent serious illness.  ?? Get a COVID-19 test unless you have already been tested. You may need to be tested more than once.  ?? Stay home. Leave only if you need to get medical care.  ?? Wear a mask whenever you're around other people for a full 10 days.  ?? Avoid travel and stay away from people at high risk for serious illness.  ?? Limit contact with pets and people in your home. If possible, stay in a separate bedroom and use a separate bathroom.  ?? Clean and disinfect your home every day. Use household cleaners and disinfectant wipes or sprays. Take special care to clean things that you touch with your hands.  If you have questions about COVID-19 testing, ask your doctor or go to [employer] to use the COVID-19 Viral Testing Tool.  How can you self-isolate when you have COVID-19?  If you have COVID-19, there are things you can do to help avoid spreading the virus to others.  ?? Limit contact with people in your home. If possible, stay in a separate bedroom and use a separate bathroom.  ?? Wear a mask when you are around other people.  ?? If you have to leave home, avoid crowds and try to stay at least 6 feet away from other people.  ?? Avoid contact with pets and other animals.  ?? Cover your mouth and nose with a tissue when you cough or sneeze. Then throw it in the trash right away.  ?? Wash your hands often, especially after you cough or sneeze. Use soap and water, and scrub for at least 20 seconds. If soap and water aren't available, use an alcohol-based hand sanitizer.  ?? Don't share personal household items. These include bedding, towels, cups and glasses, and eating utensils.  ?? Wash laundry in the warmest water allowed for the fabric type, and dry it completely. It's okay to wash other people's laundry with yours.  ?? Clean and disinfect your home. Use household cleaners and disinfectant wipes or sprays.  When should you call for help?   Call 911 anytime you think you may need emergency care. For example, call if you have life-threatening symptoms, such as:  ?? ?? You have severe trouble breathing. (You can't talk at all.)   ?? ?? You have constant chest pain or pressure.   ?? ?? You are severely dizzy or lightheaded.   ?? ?? You are confused or can't think clearly.   ?? ?? You have pale, gray, or blue-colored skin or lips.   ?? ?? You pass out (lose consciousness) or are very hard to wake up.   ?? ?? You have loss of balance  or trouble walking.   ?? ?? You have trouble seeing out of one or both eyes.   ?? ?? You have weakness or drooping on one side of the face.   ?? ?? You have weakness or numbness in an arm or a leg.   ?? ?? You have trouble speaking.   ?? ?? You have a severe headache. ?? You have a seizure.   Call your doctor now or seek immediate medical care if:  ?? ?? You have moderate trouble breathing. (You can't speak a full sentence.)   ?? ?? You are coughing up blood.   ?? ?? You have signs of low blood pressure. These include feeling lightheaded; being too weak to stand; and having cold, pale, clammy skin.   Watch closely for changes in your health, and be sure to contact your doctor if:  ?? ?? Your symptoms get worse.   ?? ?? You are not getting better as expected.   ?? ?? You have new or worse symptoms of anxiety, depression, nightmares, or flashbacks.   Call before you go to the doctor's office.  Follow their instructions. And wear a mask.  Where can you learn more?  Go to [facility] at [email or web address] forces [email or web address]/[email or web address] officer in the Menu. Enter C008 in the search box to learn more about Learning About Coronavirus (COVID-19).  Current as of: April 29, 2020??????????????????????????????Content Version: 13.2  ?? 2006-2022 [employer], [employer].   Care instructions adapted under license by [facility]. If you have questions about a medical condition or this instruction, always ask your healthcare professional. [employer], [employer] disclaims any warranty or liability for your use of this information.

## 2021-02-24 NOTE — Unmapped (Signed)
Assessment:        1. Chest congestion          Plan:     1. Labs and orders from today's visit:  Orders Placed This Encounter   Procedures   ??? XR Chest 2 views     Outpatient Encounter Medications as of 02/24/2021   Medication Sig Dispense Refill   ??? aspirin (ECOTRIN) 81 MG tablet Take 1 tablet (81 mg total) by mouth daily. 30 tablet 11   ??? atorvastatin (LIPITOR) 10 MG tablet Take 1 tablet (10 mg total) by mouth daily. 30 tablet 8   ??? BELBUCA 300 mcg buccal film Apply 1 each to cheek daily.     ??? cholecalciferol, vitamin D3, 1,000 unit (25 mcg) tablet Take 1,000 Units by mouth daily.     ??? ENVARSUS XR 1 mg Tb24 extended release tablet Take 2 tablets (2 mg total) by mouth daily. 60 tablet 11   ??? loratadine (CLARITIN) 10 mg tablet Take 10 mg by mouth daily.     ??? MYFORTIC 180 mg EC tablet Take 2 tablets (360 mg total) by mouth two (2) times a day. 120 tablet 11   ??? oxyCODONE (ROXICODONE) 10 mg immediate release tablet TAKE 1 (ONE) TABLET BY MOUTH FOUR TIMES DAILY, AS NEEDED     ??? pantoprazole (PROTONIX) 40 MG tablet Take 1 tablet (40 mg total) by mouth daily. 30 tablet 0   ??? predniSONE (DELTASONE) 10 MG tablet Take 1 tablet (10 mg total) by mouth daily for 5 days. 5 tablet 0   ??? [DISCONTINUED] ENVARSUS XR 1 mg Tb24 extended release tablet Take 2 tablets (2 mg total) by mouth daily. 180 tablet 3     No facility-administered encounter medications on file as of 02/24/2021.       2. Supportive care with appropriate antipyretics and fluids.    3. Advised patient to follow up with their PCP this week or if symptoms worsen return here or go to the emergency department for evaluation.    Patient Instructions     Patient Education      Patient Education        Learning About Preventing COVID-19 When You Have a Weakened Immune System  What is coronavirus (COVID-19)?     COVID-19 is a disease caused by a type of coronavirus. This illness was first found in December 2019. It has since spread worldwide.  Coronaviruses are a large group of viruses. They cause the common cold. They also cause more serious illnesses like Middle East respiratory syndrome (MERS) and severe acute respiratory syndrome (SARS). COVID-19 is caused by a novel coronavirus. That means it's a new type that has not been seen in people before.  What causes a weakened immune system?  Your immune system may not work well because of certain medicines used to treat cancer or autoimmune disease. Taking steroid medicines for a long time can also weaken your immune system. Other causes include certain health problems, as well as immune problems that run in your family.  Why is it important to take extra precautions?  A weakened immune system makes it more likely that you'll get very sick from COVID-19. And the COVID vaccine may not work as well for you.  How can you protect yourself?  Follow these guidelines until your doctor tells you it's safe to stop.  ?? Get vaccinated and boosted.  ?? Talk to your doctor about how many doses you need.  ?? Avoid sick people.  ??  Wear a mask if you go into public areas, especially indoor spaces.  ?? Avoid crowds, and try to stay 6 feet apart from other people. If you have to be in a crowded area, wear a mask, even if you're outside.  ?? Wash your hands often.  ?? Avoid touching your mouth, nose, and eyes.  ?? Ask the people you live with or who are in close contact with you to get vaccinated and boosted.  ?? Ask the people you live with to wear a mask in public areas, even if they're fully vaccinated and boosted.  If you think you've been exposed, call your doctor as soon as you can. Your doctor may have you take medicine to help prevent serious illness. Get a COVID-19 test. You may need to be tested more than once.  Where can you learn more?  Go to [facility] at [email or web address] [email or web address] in the Menu. Enter C127 in the search box to learn more about Learning About Preventing COVID-19 When You Have a Weakened Immune System.  Current as of: August 04, 2020??????????????????????????????Content Version: 13.2  ?? 2006-2022 [employer].   Care instructions adapted under license by [facility]. If you have questions about a medical condition or this instruction, always ask your healthcare professional. [employer] disclaims any warranty or liability for your use of this information.         Learning About Coronavirus (COVID-19)  What is coronavirus (COVID-19)?     COVID-19 is a disease caused by a type of coronavirus. This illness was first found in December 2019. It has since spread worldwide.  Coronaviruses are a large group of viruses. They cause the common cold. They also cause more serious illnesses like [place] respiratory syndrome (MERS) and severe acute respiratory syndrome (SARS). COVID-19 is caused by a novel coronavirus. That means it's a new type that has not been seen in people before.  What are the symptoms?  COVID-19 symptoms may include:  ?? Fever.  ?? Cough.  ?? Trouble breathing.  ?? Chills or repeated shaking with chills.  ?? Muscle and body aches.  ?? Headache.  ?? Sore throat.  ?? New loss of taste or smell.  ?? Vomiting.  ?? Diarrhea.  In severe cases, COVID-19 can cause pneumonia and make it hard to breathe without help from a machine. It can cause death.  How is it diagnosed?  COVID-19 is diagnosed with a viral test. This may also be called a PCR test or antigen test. It looks for evidence of the virus in your breathing passages or lungs (respiratory system).  The test is most often done on a sample from the nose, throat, or lungs. It's sometimes done on a sample of saliva. One way a sample is collected is by putting a long swab into the back of your nose.  If you have questions about COVID-19 testing, ask your doctor or go to [employer] to use the COVID-19 Viral Testing Tool.  How is it treated?  Mild cases of COVID-19 can be treated at home. Serious cases need treatment in the hospital. Treatment may include medicines to reduce symptoms, plus breathing support such as oxygen therapy or a ventilator. Some people may be placed on their belly to help their oxygen levels.  Treatments that may help people who have COVID-19 include:  Antiviral medicines.  These medicines treat viral infections.  Immune-based therapy.  These medicines help the immune system fight COVID-19. Examples include monoclonal antibodies.  Blood  thinners.  These medicines help prevent blood clots. People with severe illness are at risk for blood clots.  How can you protect yourself and others?  ?? Get vaccinated and boosted.  ?? Avoid sick people and stay away from others if you are sick.  ?? Stay at least 6 feet away from other people.  ?? Avoid crowds, especially inside.  ?? Get tested for COVID-19 before you have an indoor visit with people that don't live with you.  ?? Cover your mouth with a tissue when you cough or sneeze.  ?? Wash your hands often, especially after you cough or sneeze. Use soap and water, and scrub for at least 20 seconds. If soap and water aren't available, use an alcohol-based hand sanitizer.  ?? Avoid touching your mouth, nose, and eyes.  Be sure to follow all instructions from the [facility] and your local health authorities. Here are some examples of specific precautions you may need to take.  ?? If you are not fully vaccinated and boosted:  ? Wear a mask if you have to go to public areas.  ?? Even if you're fully vaccinated and boosted, there's still a chance you can get and spread COVID-19. If you live in an area where COVID-19 is spreading quickly, wear a mask if you have to go to indoor public areas. You might also want to wear a mask in crowded outdoor areas as well as public indoor spaces if you:  ? Have certain health conditions.  ? Live with someone who has a compromised immune system.  ? Live with someone who is not fully vaccinated.  ?? If you have been exposed to the virus and are not fully vaccinated and boosted:  ? Talk to your doctor as soon as you can. Your doctor might have you take medicine to help prevent serious illness.  ? Get a COVID-19 test. You may need to be tested more than once.  ? Stay home. Try to separate from other people where you live. Don't go to school, work, or public areas.  ? Wear a mask around other people for a full 10 days. Avoid travel and stay away from people at high risk for serious illness.  ? Watch for symptoms.  ?? If you have been exposed and you are fully vaccinated and boosted:  ? Talk to your doctor as soon as you can. Your doctor may have you take medicine to help prevent serious illness.  ? Get a COVID-19 test. You may need to be tested more than once.  ? Wear a mask around other people for a full 10 days. Avoid travel and stay away from people at high risk for serious illness.  ? Watch for symptoms.  If you're sick or test positive for COVID-19:  ?? Talk to your doctor as soon as you can. Your doctor may have you take medicine to help prevent serious illness.  ?? Get a COVID-19 test unless you have already been tested. You may need to be tested more than once.  ?? Stay home. Leave only if you need to get medical care.  ?? Wear a mask whenever you're around other people for a full 10 days.  ?? Avoid travel and stay away from people at high risk for serious illness.  ?? Limit contact with pets and people in your home. If possible, stay in a separate bedroom and use a separate bathroom.  ?? Clean and disinfect your home every day. Use household cleaners and disinfectant  wipes or sprays. Take special care to clean things that you touch with your hands.  If you have questions about COVID-19 testing, ask your doctor or go to [employer] to use the COVID-19 Viral Testing Tool.  How can you self-isolate when you have COVID-19?  If you have COVID-19, there are things you can do to help avoid spreading the virus to others.  ?? Limit contact with people in your home. If possible, stay in a separate bedroom and use a separate bathroom.  ?? Wear a mask when you are around other people.  ?? If you have to leave home, avoid crowds and try to stay at least 6 feet away from other people.  ?? Avoid contact with pets and other animals.  ?? Cover your mouth and nose with a tissue when you cough or sneeze. Then throw it in the trash right away.  ?? Wash your hands often, especially after you cough or sneeze. Use soap and water, and scrub for at least 20 seconds. If soap and water aren't available, use an alcohol-based hand sanitizer.  ?? Don't share personal household items. These include bedding, towels, cups and glasses, and eating utensils.  ?? Wash laundry in the warmest water allowed for the fabric type, and dry it completely. It's okay to wash other people's laundry with yours.  ?? Clean and disinfect your home. Use household cleaners and disinfectant wipes or sprays.  When should you call for help?   Call 911 anytime you think you may need emergency care. For example, call if you have life-threatening symptoms, such as:  ?? ?? You have severe trouble breathing. (You can't talk at all.)   ?? ?? You have constant chest pain or pressure.   ?? ?? You are severely dizzy or lightheaded.   ?? ?? You are confused or can't think clearly.   ?? ?? You have pale, gray, or blue-colored skin or lips.   ?? ?? You pass out (lose consciousness) or are very hard to wake up.   ?? ?? You have loss of balance or trouble walking.   ?? ?? You have trouble seeing out of one or both eyes.   ?? ?? You have weakness or drooping on one side of the face.   ?? ?? You have weakness or numbness in an arm or a leg.   ?? ?? You have trouble speaking.   ?? ?? You have a severe headache.   ?? ?? You have a seizure.   Call your doctor now or seek immediate medical care if:  ?? ?? You have moderate trouble breathing. (You can't speak a full sentence.)   ?? ?? You are coughing up blood.   ?? ?? You have signs of low blood pressure. These include feeling lightheaded; being too weak to stand; and having cold, pale, clammy skin.   Watch closely for changes in your health, and be sure to contact your doctor if:  ?? ?? Your symptoms get worse.   ?? ?? You are not getting better as expected.   ?? ?? You have new or worse symptoms of anxiety, depression, nightmares, or flashbacks.   Call before you go to the doctor's office.  Follow their instructions. And wear a mask.  Where can you learn more?  Go to [facility] at [email or web address] [email or web address] in the Menu. Enter C008 in the search box to learn more about Learning About Coronavirus (COVID-19).  Current as of: April 29, 2020??????????????????????????????Content Version: 13.2  ??  2006-2022 [employer].   Care instructions adapted under license by [facility]. If you have questions about a medical condition or this instruction, always ask your healthcare professional. [employer] disclaims any warranty or liability for your use of this information.              Previsit planning was completed via snapshot and review of chart.   Patient verbalized to me that they understood what their problem is, what they need to do about it, and why it is important that they do it.   The patient voices understanding of all medications. No barriers to adherence were noted. Patient is taking all medications as prescribed and is tolerating well.   Plan for follow-up as discussed or as needed if any worsening symptoms or change in condition.   After Visit Summary was given to patient.     Subjective:      MONIK LINS is a 65 y.o. female who presents for evaluation of influenza like symptoms. Symptoms include chills, headache, myalgias, productive cough and fever and have been present for 5 days . Jordain has tried to alleviate the symptoms with acetaminophen, ibuprofen with minimal relief. High risk factors for influenza complications: none.   She states that she was prescribed abx by her PCP but is not feeling better.    She declined any testing today even though testing was advised due to her immunocompromised state.     Associated symptoms include congestion.  The patient denies constipation, dizziness, rash, diarrhea, dysuria, nausea and vomiting.    The following portions of the patient's history were reviewed and updated as appropriate: allergies, current medications, past family history, past medical history, past social history, past surgical history and problem list.    Past Medical History:   Diagnosis Date   ??? Acute renal failure (ARF) (CMS-HCC)    ??? Acute renal injury (CMS-HCC) 09/24/2015   ??? Anemia 04/21/2015   ??? Ascites due to alcoholic cirrhosis (CMS-HCC)    ??? Atelectasis 08/26/2016   ??? Brain aneurysm    ??? Breast cancer (CMS-HCC)    ??? Cirrhosis, alcoholic (CMS-HCC)    ??? Degenerative arthritis    ??? Esophageal varices (CMS-HCC)    ??? H/o ETOH abuse 06/19/2016   ??? Hepatic encephalopathy (CMS-HCC)    ??? History of cholecystectomy 08/25/2016   ??? Hypertension    ??? Leukocytosis 10/31/2016   ??? Lower extremity edema    ??? 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 Pleural effusion associated with hepatic disorder 08/26/2016   ??? Portal hypertension (CMS-HCC)    ??? SOB (shortness of breath) 06/19/2016   ??? Stage III chronic kidney disease (CMS-HCC) 07/03/2015       Allergies   Allergen Reactions   ??? Latex Swelling and Rash     Discoloration of skin.   Discoloration of skin.    ??? Hydrocodone Itching   ??? Hydrocodone-Acetaminophen Itching        Past Surgical History:   Procedure Laterality Date   ??? BREAST LUMPECTOMY  ~2000   ??? CATARACT EXTRACTION     ??? CEREBRAL ANEURYSM REPAIR     ??? CHOLECYSTECTOMY  2010   ??? 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 PR TRANSPLANTATION OF KIDNEY N/A 09/13/2017    Procedure: RENAL ALLOTRANSPLANTATION, IMPLANTATION  OF GRAFT; WITHOUT RECIPIENT NEPHRECTOMY;  Surgeon: Florene Glen, MD;  Location: MAIN [facility];  Service: Transplant   ??? right knee replacement     ??? TIPS PROCEDURE  07/16/2015       Family History   Problem Relation Age of Onset   ??? Aneurysm Mother    ??? Alcohol abuse Father    ??? Cirrhosis Father    ??? Alcohol abuse Sister        Social History     Socioeconomic History   ??? Marital status: Widowed     Spouse name: Not on file   ??? Number of children: Not on file   ??? Years of education: Not on file   ??? Highest education level: Not on file   Occupational History   ??? Not on file   Tobacco Use   ??? Smoking status: Never Smoker   ??? Smokeless tobacco: Never Used   Vaping Use   ??? Vaping Use: Never used   Substance and Sexual Activity   ??? Alcohol use: No     Alcohol/week: 0.0 standard drinks   ??? Drug use: No   ??? Sexual activity: Not on file   Other Topics Concern   ??? Not on file   Social History Narrative    Previously drank 3 x 16 oz beers daily and a fifth of vodka per week.  Sober since 03/2015.  Lives with her husband, who has double BKAs and has had a renal transplant.  He helps make sure she takes her lactulose and also does some of the cooking.  Daughter lives nearby and is other main social support.  Multiple other family members close by.       Social Determinants of Health     Financial Resource Strain: Not on file   Food Insecurity: Not on file   Transportation Needs: Not on file   Physical Activity: Not on file   Stress: Not on file   Social Connections: Not on file        ROS      As noted in HPI. All other ROS negative.     Objective:     BP 137/98 (BP Site: R Arm)  - Pulse 70  - Temp 36.3 ??C (97.3 ??F) (Temporal)  - Ht 162.6 cm (5' 4)  - Wt 88.5 kg (195 lb)  - LMP  (LMP Unknown)  - SpO2 94%  - BMI 33.47 kg/m??   General appearance: alert, appears stated age, cooperative and no distress  Head: Normocephalic, without obvious abnormality, atraumatic  Eyes: conjunctivae/corneas clear. PERRL, EOM's intact. Fundi benign.  Ears: normal TM's and external ear canals both ears  Nose: Nares normal. Septum midline. Mucosa normal. No drainage or sinus tenderness.  Throat: lips, mucosa, and tongue normal; teeth and gums normal  Neck: no adenopathy, no carotid bruit, no JVD, supple, symmetrical, trachea midline and thyroid not enlarged, symmetric, no tenderness/mass/nodules  Lungs: clear to auscultation bilaterally  Heart: regular rate and rhythm, S1, S2 normal, no murmur, click, rub or gallop  Skin: Skin color, texture, turgor normal. No rashes or lesions       No results found for this or any previous visit (from the past 14 hour(s)).

## 2021-02-28 ENCOUNTER — Ambulatory Visit: Admit: 2021-02-28 | Discharge: 2021-02-28 | Payer: MEDICARE

## 2021-02-28 DIAGNOSIS — Z944 Liver transplant status: Principal | ICD-10-CM

## 2021-02-28 DIAGNOSIS — Z79899 Other long term (current) drug therapy: Principal | ICD-10-CM

## 2021-02-28 DIAGNOSIS — R809 Proteinuria, unspecified: Principal | ICD-10-CM

## 2021-02-28 LAB — CBC W/ AUTO DIFF
BASOPHILS ABSOLUTE COUNT: 0.1 10*9/L (ref 0.0–0.1)
BASOPHILS RELATIVE PERCENT: 0.8 %
EOSINOPHILS ABSOLUTE COUNT: 0.2 10*9/L (ref 0.0–0.5)
EOSINOPHILS RELATIVE PERCENT: 2.4 %
HEMATOCRIT: 40.7 % (ref 34.0–44.0)
HEMOGLOBIN: 13.5 g/dL (ref 11.3–14.9)
LYMPHOCYTES ABSOLUTE COUNT: 2.9 10*9/L (ref 1.1–3.6)
LYMPHOCYTES RELATIVE PERCENT: 32.8 %
MEAN CORPUSCULAR HEMOGLOBIN CONC: 33.3 g/dL (ref 32.0–36.0)
MEAN CORPUSCULAR HEMOGLOBIN: 27.2 pg (ref 25.9–32.4)
MEAN CORPUSCULAR VOLUME: 81.8 fL (ref 77.6–95.7)
MEAN PLATELET VOLUME: 8.2 fL (ref 6.8–10.7)
MONOCYTES ABSOLUTE COUNT: 0.7 10*9/L (ref 0.3–0.8)
MONOCYTES RELATIVE PERCENT: 8.6 %
NEUTROPHILS ABSOLUTE COUNT: 4.8 10*9/L (ref 1.8–7.8)
NEUTROPHILS RELATIVE PERCENT: 55.4 %
PLATELET COUNT: 353 10*9/L (ref 150–450)
RED BLOOD CELL COUNT: 4.97 10*12/L (ref 3.95–5.13)
RED CELL DISTRIBUTION WIDTH: 15.2 % (ref 12.2–15.2)
WBC ADJUSTED: 8.7 10*9/L (ref 3.6–11.2)

## 2021-02-28 LAB — URINALYSIS
BILIRUBIN UA: NEGATIVE
GLUCOSE UA: NEGATIVE
KETONES UA: NEGATIVE
NITRITE UA: NEGATIVE
PH UA: 6 (ref 5.0–9.0)
PROTEIN UA: 100 — AB
RBC UA: 1 /HPF (ref <=4)
RENAL TUBULAR EPITHELIAL CELLS: <1 /HPF — ABNORMAL HIGH
SPECIFIC GRAVITY UA: 1.02 (ref 1.003–1.030)
SQUAMOUS EPITHELIAL: 3 /HPF (ref 0–5)
UROBILINOGEN UA: 0.2
WBC UA: 19 /HPF — ABNORMAL HIGH (ref 0–5)

## 2021-02-28 LAB — COMPREHENSIVE METABOLIC PANEL
ALBUMIN: 3.2 g/dL — ABNORMAL LOW (ref 3.4–5.0)
ALKALINE PHOSPHATASE: 78 U/L (ref 46–116)
ALT (SGPT): 20 U/L (ref 10–49)
ANION GAP: 5 mmol/L (ref 5–14)
AST (SGOT): 22 U/L (ref <=34)
BILIRUBIN TOTAL: 0.3 mg/dL (ref 0.3–1.2)
BLOOD UREA NITROGEN: 11 mg/dL (ref 9–23)
BUN / CREAT RATIO: 19
CALCIUM: 8.1 mg/dL — ABNORMAL LOW (ref 8.7–10.4)
CHLORIDE: 104 mmol/L (ref 98–107)
CO2: 29 mmol/L (ref 20.0–31.0)
CREATININE: 0.58 mg/dL — ABNORMAL LOW
EGFR CKD-EPI AA FEMALE: >90 mL/min/{1.73_m2} (ref >=60)
EGFR CKD-EPI NON-AA FEMALE: >90 mL/min/{1.73_m2} (ref >=60)
GLUCOSE RANDOM: 97 mg/dL (ref 70–179)
POTASSIUM: 3.6 mmol/L (ref 3.4–4.8)
PROTEIN TOTAL: 6.3 g/dL (ref 5.7–8.2)
SODIUM: 138 mmol/L (ref 135–145)

## 2021-02-28 LAB — BILIRUBIN, DIRECT: BILIRUBIN DIRECT: <0.1 mg/dL (ref 0.00–0.30)

## 2021-02-28 LAB — TACROLIMUS LEVEL: TACROLIMUS BLOOD: 4.9 ng/mL

## 2021-02-28 LAB — PHOSPHORUS: PHOSPHORUS: 3.1 mg/dL (ref 2.4–5.1)

## 2021-02-28 LAB — GAMMA GT: GAMMA GLUTAMYL TRANSFERASE: 26 U/L

## 2021-02-28 LAB — PROTEIN / CREATININE RATIO, URINE
CREATININE, URINE: 99 mg/dL
PROTEIN URINE: 229.2 mg/dL
PROTEIN/CREAT RATIO, URINE: 2.315

## 2021-02-28 LAB — MAGNESIUM: MAGNESIUM: 1.7 mg/dL (ref 1.6–2.6)

## 2021-02-28 NOTE — Unmapped (Signed)
Lab orders prior to clinic visit.

## 2021-02-28 NOTE — Unmapped (Signed)
[facility] LIVER CENTER  [facility]  [address] [place]  [place], [place]  [address]  Ph: [phone]  Fax: [phone]     TRANSPLANT HEPATOLOGY FOLLOW UP CLINIC NOTE    PRIMARY CARE PROVIDER: Consuella Lose, MD    DATE OF SERVICE: 02/28/2021    ASSESSMENT AND PLAN:   [name] is a 65 y.o. 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 She received a HCV Ab+/NAT- organ and has not developed chronic HCV in the post-transplant period.     She is doing well and her graft function has remained stable. My biggest concern is her complaint of urine bubbles. She had 4+ proteinuria on her last UA a few weeks ago and now has a decline albumin. This is concerning for renal injury of some sort and we will evaluate with some urine studies and then reach out to nephrology regarding further evaluation.      1. Immunosuppression: Envarsus 2 mg daily, Myfortic 360 mg BID,  Tac goal 3-5. Tac trough today 4.9  -Repeat labs q3 months    2. Donor liver Hep C Ab+/NAT: Checking HCV VL again today in light of new proteinuria, to rule out possible MPGN related disease    3. Proteinuria: Concern for development of renal injury/rejection  -UA + UPC today  -Will reach out to nephrology regarding next steps    4. Chronic opioids for knee pain, not interested in knee replacement at this time. Oxycodone 10 mg QID + Belbuca 300 mcg film tab daily prescribed by Dr Mikael Spray     5. Metabolic syndrome     -HLD  -continue atorvastatin 10 mg daily, last check 09/22/2020 LDL 125, HDL 39,  Recheck in one year  -HTN  -119/92 HR 58, not taking any antihypertensive medications  -insulin resistance   HgA1c 5.9 on 09/22/2020, discussed healthy diet    Preventative Health:   - Bone Health:  DEXA scan done 08/2018 was normal. Repeat due now (2 years)  - Dermatology: This patient is at increased risk for the development of skin cancers due to immunosuppressant medications. We recommend yearly surveillance. The patient has been informed of the same. Advised to schedule appointment with Dermatologist  - CRC screening: Colonoscopy done 08/07/2018 and showed one small adenoma. Next screening  In 7 years.  -Women's Health: Mammogram and PAP smear are up to date per patient report    Vaccinations: We recommend that patients have vaccinations to prevent various infections that can occur, especially in the setting of having underlying liver disease. The following vaccinations should be given:  -Hepatitis A: HAV IgG+  -Hepatitis B: Vaccinated previously  -Influenza (yearly): UTD  -Pneumococcal: PPSV23 and PCV13 UTD  -Zoster: Shingrix (age > 52) UTD  -SARS-CoV-2: Moderna 01/14/2020, 02/09/2020, 07/07/2020, Booster given today     I spent a majority of my time today with the patient reviewing historical data, old records (including faxed data and data in Care Everywhere), performing a physical examination, and formulating an assessment and plan together with the patient.    Return to clinic in 1 year.         Georga Hacking Sherryll Burger, MD  Assistant Professor of Medicine   [facility]  Division of Gastroenterology & Hepatology  p: [phone] - f: [phone]  [email or web address]    Subjective  CHIEF COMPLAINT:  Transplant follow up    HISTORY OF PRESENT ILLNESS:      Michelle Travis is a 65 y.o. 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 No history of graft rejection. Due to chronic left knee pain she takes oxycodone 10 mg 4 x a day, managed by local pain MD. She has been evaluated for a knee replacement but is not ready to proceed at this time.     INTERVAL HISTORY:(since the time of the patient's last visit):    The patient was last seen in Transplant Hepatology 12/22/2019. Since that time she has not had any liver related hospitalizations, difficulty with fluid retention, jaundice or urticaria.  However, sh reports seeing  bubbles in toilet after urinating. This began about 6 weeks ago. She does not have a sensation of passing air, does not have dysuria or urinary frequency. No fevers or recurrent UTIs. She is with her daughter today and is in good spirits. She has no other complaints or concerns.     PAST MEDICAL HISTORY:  Past Medical History:   Diagnosis Date   ??? Acute renal failure (ARF) (CMS-HCC)    ??? Acute renal injury (CMS-HCC) 09/24/2015   ??? Anemia 04/21/2015   ??? Ascites due to alcoholic cirrhosis (CMS-HCC)    ??? Atelectasis 08/26/2016   ??? Brain aneurysm    ??? Breast cancer (CMS-HCC)    ??? Cirrhosis, alcoholic (CMS-HCC)    ??? Degenerative arthritis    ??? Esophageal varices (CMS-HCC)    ??? H/o ETOH abuse 06/19/2016   ??? Hepatic encephalopathy (CMS-HCC)    ??? History of cholecystectomy 08/25/2016   ??? Hypertension    ??? Leukocytosis 10/31/2016   ??? Lower extremity edema    ??? 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 Pleural effusion associated with hepatic disorder 08/26/2016   ??? Portal hypertension (CMS-HCC)    ??? SOB (shortness of breath) 06/19/2016   ??? Stage III chronic kidney disease (CMS-HCC) 07/03/2015     Patient Active Problem List    Diagnosis Date Noted   ??? Immunosuppressed status (CMS-HCC) 03/22/2020   ??? History of colonic polyps 05/17/2018   ??? Aftercare following organ transplant 10/17/2017   ??? Liver replaced by transplant (CMS-HCC) 09/14/2017   ??? Kidney transplanted 09/14/2017   ??? Rectus sheath hematoma 11/22/2016   ??? Diastolic dysfunction with chronic heart failure (CMS-HCC) 10/29/2016   ??? 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 Chronic back pain 06/19/2016   ??? Sleep apnea 06/19/2016   ??? History of breast cancer 2000 s/p lumpectomy 06/07/2016   ??? Hypertension (RAF-HCC)        MEDICATIONS:   Current Outpatient Medications   Medication Sig Dispense Refill   ??? aspirin (ECOTRIN) 81 MG tablet Take 1 tablet (81 mg total) by mouth daily. 30 tablet 11   ??? atorvastatin (LIPITOR) 10 MG tablet Take 1 tablet (10 mg total) by mouth daily. 30 tablet 8   ??? BELBUCA 300 mcg buccal film Apply 1 each to cheek daily.     ??? cholecalciferol, vitamin D3, 1,000 unit (25 mcg) tablet Take 1,000 Units by mouth daily.     ??? ENVARSUS XR 1 mg Tb24 extended release tablet Take 2 tablets (2 mg total) by mouth daily. 60 tablet 11   ??? loratadine (CLARITIN) 10 mg tablet Take 10 mg by mouth daily.     ??? MYFORTIC  180 mg EC tablet Take 2 tablets (360 mg total) by mouth two (2) times a day. 120 tablet 11   ??? oxyCODONE (ROXICODONE) 10 mg immediate release tablet TAKE 1 (ONE) TABLET BY MOUTH FOUR TIMES DAILY, AS NEEDED     ??? pantoprazole (PROTONIX) 40 MG tablet Take 1 tablet (40 mg total) by mouth daily. 30 tablet 0   ??? predniSONE (DELTASONE) 10 MG tablet Take 1 tablet (10 mg total) by mouth daily for 5 days. 5 tablet 0     No current facility-administered medications for this visit.       ALLERGIES:   Latex, Hydrocodone, and Hydrocodone-acetaminophen    Objective    VITAL SIGNS: BP 119/92  - Pulse 58  - Temp 36.9 ??C (98.4 ??F) (Tympanic)  - Ht 162.6 cm (5' 4)  - Wt 86.7 kg (191 lb 1.6 oz)  - LMP  (LMP Unknown)  - SpO2 98%  - BMI 32.80 kg/m??   Body mass index is 32.8 kg/m??.  Wt Readings from Last 3 Encounters:   02/28/21 86.7 kg (191 lb 1.6 oz)   02/24/21 88.5 kg (195 lb)   09/22/20 86.4 kg (190 lb 6.4 oz)       PHYSICAL EXAM:  Constitutional: Alert, Oriented x 3, No acute distress  HEENT: PERRL, conjunctiva clear, anicteric  CV: Regular rate and rhythm  Lung: Clear to auscultation bilaterally. Unlabored breathing.   Abdomen: soft, obese, non-tender. No organomegaly. No ascites.   Extremities: No edema, well perfused  MSK: No joint swelling or tenderness noted, no deformities  Skin: No rashes, jaundice or skin lesions noted  Neuro: No focal deficits. No asterixis.   Mental Status: Thought organized, appropriate affect    DIAGNOSTIC STUDIES:  I have reviewed all pertinent diagnostic studies, including:  Laboratory results:  Lab Results   Component Value Date    WBC 8.7 02/28/2021    HGB 13.5 02/28/2021    MCV 81.8 02/28/2021    PLT 353 02/28/2021    Lab Results   Component Value Date    NA 138 02/28/2021    K 3.6 02/28/2021    CL 104 02/28/2021    CREATININE 0.58 (L) 02/28/2021    GLU 97 02/28/2021    CALCIUM 8.1 (L) 02/28/2021      Lab Results   Component Value Date    ALBUMIN 3.2 (L) 02/28/2021    AST 22 02/28/2021    ALT 20 02/28/2021    ALKPHOS 78 02/28/2021    BILITOT 0.3 02/28/2021    Lab Results   Component Value Date    INR 0.98 09/25/2017        Lab Results   Component Value Date    CHOL 189 09/22/2020    TRIG 126 09/22/2020    A1C 5.9 (H) 09/22/2020     Lab Results   Component Value Date    HEPAIGG Reactive (A) 06/12/2017    HBSAG Nonreactive 09/09/2018    HEPBSAB Nonreactive 09/09/2018    HEPBCAB Nonreactive 09/09/2018    HEPCAB Nonreactive 09/12/2017    HIV Nonreactive 10/17/2017       Lab Results   Component Value Date    TACROLIMUS 4.9 02/28/2021    TACROLIMUS 2.7 02/09/2021    TACROLIMUS 3.2 12/29/2020    TACROLIMUS 3.2 11/10/2020       Explant Data 09/13/2017:  A: Liver, native, explantation  - Hepatic cirrhosis (liver weight 870 g), clinically alcoholic cirrhosis.  - PAS-D stain positive for abnormal intracytoplasmic PAS-D  positive inclusions (See Comment).  - Reticulin and trichrome stains confirmatory highlighting extensive hepatic fibrosis and cirrhosis.  - Iron stain negative for increased hepatocellular iron deposition.  - Negative for malignancy including the examined resection margins.  - Minimal steatosis involving approximately <1% of liver parenchyma.    Radiographic studies: Individually reviewed

## 2021-02-28 NOTE — Unmapped (Signed)
Pt ID verified with Name and Date of birth. The EUA Covid-19 Fact Sheet given to patient. All screening questions answered. Verbal consent taken. Vaccine administered as ordered.  See immunization history for documentation.  Pt tolerated well with no issues noted. Recommend that patient stay in observation area for 15 minutes.

## 2021-02-28 NOTE — Unmapped (Signed)
Pt seen in clinic with Dr. Sherryll Burger and NP [name] for annual post liver txp check up. Pt is accompanied by her daughter Helmut Muster. She denies any medical issues since last seen except for bubbles in her urine. She utilizes a cane intermittently and has been postponing her knee replacement until after the pandemic is gone. The pt has been caring part time for a friend with dementia. She has an established PCP ([facility]) and uses [facility] for Myfortic and [employer] for Envarsus. Screenings are up to date - colonoscopy in 2019, mammogram scheduled for this year. Pt will receive Moderna booster ( ) today in clinic. Urinalysis & UPC ratio ordered by Dr. Sherryll Burger. Pt brought proof of income to be sent to [facility] for Envarsus MAP.

## 2021-02-28 NOTE — Unmapped (Signed)
Urine was collected and sent to the lab.

## 2021-03-01 NOTE — Unmapped (Signed)
Patient Michelle Travis reviewed with [place] patient advised she will need a kidney biopsy 5/9.

## 2021-03-02 DIAGNOSIS — Z944 Liver transplant status: Principal | ICD-10-CM

## 2021-03-02 DIAGNOSIS — R809 Proteinuria, unspecified: Principal | ICD-10-CM

## 2021-03-02 DIAGNOSIS — Z79899 Other long term (current) drug therapy: Principal | ICD-10-CM

## 2021-03-02 DIAGNOSIS — D849 Immunodeficiency, unspecified: Principal | ICD-10-CM

## 2021-03-02 DIAGNOSIS — Z940 Kidney transplant status: Principal | ICD-10-CM

## 2021-03-02 MED ORDER — ENVARSUS XR 1 MG TABLET,EXTENDED RELEASE
ORAL_TABLET | Freq: Every day | ORAL | 11 refills | 30.00000 days | Status: CP
Start: 2021-03-02 — End: 2022-03-02

## 2021-03-02 NOTE — Unmapped (Signed)
Envarsus MAP approved, script sent to [facility].

## 2021-03-04 NOTE — Unmapped (Signed)
Called to verify pts renal biopsy, pt asked to arrive at  0700-0730, pt not currently taking any blood thinners, NPO status reinforced, pt will have responsible party accompany to appointment, COVID screening negative, procedure explained and all questions answered.

## 2021-03-04 NOTE — Unmapped (Signed)
Kidney Transplant Biopsy Note    Date of Biopsy Referral: 03/04/21    Referring Transplant Provider: Jackey Loge, MD  [facility]: [phone]  Pager: [phone]  Kidney Transplant Coordinator: Wincent Haywood Lasso, Johnna Acosta      Biopsy Fellow  _____________________ at _______________________ otherwise contact referring provider.     (NAME)   (CONTACT - pager/cell phone #)    ---------------------------------------------------------------------------------------------------------------------  Michelle Travis?: YES  Patient Name: [name]  MR: [id number]  Age: 65 y.o.  Sex: Female Race: Black or African American Ethnicity: Not Hispanic or Latino   DOB:12/24/1955    Procedures: Ultrasound Guided Percutaneous Kidney Biopsy under Moderate Sedation  Tissue Submitted: Kidney  Special Studies Required: LM, IF, EM  ----------------------------------------------------------------------------------------------------------------------  Date of allograft implantation: 09/13/2017 (Liver), 09/13/2017 (Kidney)   ABO Incompatible: No  Underlying native kidney disease: Membranous Nephropathy    Was the diagnosis established by biopsy? Yes  Previous transplant biopsies: No  If yes, what were the previous diagnoses?   Previous kidney transplants: No If yes, this is #:     Donor Information (if available)  Age of donor: 55  Gender: Female  Race: Caucasian  Type of donor: Cadaveric  Cold Ischemia time: 10 hr 25 min  Delayed graft function: Yes  If yes, how many days on dialysis: unknown (ended 09/24/2017)    History/Clinical Diagnosis/Indication for Biopsy: elevated UPC  HX: Liver/kidney transplant; alcoholic cirrhosis; hx breast cancer 17+ years ago; hx IgG monoclonal gammopathy, IgG lambda; hx cerebral aneurysm repair; HTN; sleep apnea    ----------------------------------------------------------------------------------------------------------------------  Current Baseline Immunosuppression: prednisone, tacrolimus (Prograf or Envarsus) and mycophenolate (Cellcept or Myfortic) (please select ALL drugs used)  Specific anti-rejection treatment WITHIN 2 WEEKS before biopsy: No  If yes, what was the type of treatment? None  Patient off immunosuppression?: No  Patient seems adherent to immunosuppression? Yes  Patient is currently back on dialysis? No  Has patient had any prior episodes of rejection? No   If yes, what was the treatment? None  ----------------------------------------------------------------------------------------------------------------------  Donor Specific Antibody Results:  Lab Results   Component Value Date    Donor ID [id number] 09/22/2020    Donor HLA-A Antigen #1 A1 09/22/2020    Anti-Donor HLA-A #1 MFI 0 09/22/2020    Donor HLA-A Antigen #2 A2 09/22/2020    Donor HLA-B Antigen #1 B8 09/22/2020    Anti-Donor HLA-B #1 MFI 0 09/22/2020    Donor HLA-B Antigen #2 B51 09/22/2020    Anti-Donor HLA-B #2 MFI 0 09/22/2020    Donor HLA-C Antigen #1 C1 09/22/2020    Anti-Donor HLA-C #1 MFI 0 09/22/2020    Donor HLA-C Antigen #2 C7 09/22/2020    Anti-Donor HLA-C #2 MFI 0 09/22/2020    Donor HLA-DR Antigen #1 DR1 09/22/2020    Anti-Donor HLA-DR #1 MFI 345 09/22/2020    Donor HLA-DR Antigen #2 DR7 09/22/2020    Anti-Donor HLA-DR #2 MFI 122 09/22/2020    Donor HLA-DQB Antigen #1 DQ9 09/22/2020    Anti-Donor HLA-DQB #1 MFI 45 09/22/2020    Donor HLA-DQB Antigen #2 DQ5 09/22/2020    Anti-Donor HLA-DQB #2 MFI 269 09/22/2020    DSA Comment  09/22/2020      Comment:      The HLA antigens listed are donor antigens and the corresponding MFI value.  MFI values >= 1000 are considered positive.  A negative result does not exclude the presence of low level DSA.  Blank donor antigen and MFI fields indicate unavailable donor HLA  type or lack of appropriate single antigen bead to determine DSA.  As such, the presence or absence of DSA to the locus cannot be confirmed.     Donor specific antibody (DSA) testing is performed with a  solid phase multiplex single antigen bead array method.  All procedures and reagents have been validated and performance characteristics determined by the [facility] Laboratory.    Certain of these tests have not been cleared/approved by the U.S. Food and Drug Administration (FDA).  The FDA has determined that such approval/clearance is not necessary because this laboratory is certified under the Clinical Laboratory Improvements   Amendments to perform high complexity testing.  This test is used for clinical purposes.  It should not be regarded as investigational or for research.  All HLA typings are performed using molecular methodologies.  HLA-A, B, C, DR, and DQ results are   serological equivalents of the molecular type.           Blood Pressure (mmHg):   BP Readings from Last 3 Encounters:   02/28/21 119/92   02/24/21 137/98   09/22/20 121/83     On Anti-hypertensive Therapy: No    Urinalysis:  Lab Results   Component Value Date    Color, UA Yellow 02/28/2021    Color, UA Yellow 02/09/2021    Specific Gravity, UA 1.020 02/28/2021    Specific Gravity, UA 1.020 02/09/2021    pH, UA 6.0 02/28/2021    pH, UA 6.0 02/09/2021    Glucose, UA Negative 02/28/2021    Glucose, UA Negative 02/09/2021    Ketones, UA Negative 02/28/2021    Ketones, UA Negative 02/09/2021    Blood, UA Trace (A) 02/28/2021    Blood, UA 1+ (A) 02/09/2021    Nitrite, UA Negative 02/28/2021    Nitrite, UA Negative 02/09/2021    Leukocyte Esterase, UA Trace (A) 02/28/2021    Leukocyte Esterase, UA Negative 02/09/2021    Urobilinogen, UA 0.2 mg/dL 16/07/9603    Urobilinogen, UA 0.2 02/09/2021    Bilirubin, UA Negative 02/28/2021    Bilirubin, UA Negative 02/09/2021    WBC, UA 11-30 (A) 02/09/2021    RBC, UA 0-2 02/09/2021     Urine protein/creatinine ratio:   Lab Results   Component Value Date    Protein/Creatinine Ratio, Urine 2.315 02/28/2021       Creatinine (present peak): 0.58  Creatinine (baseline): <1.0  Lab Results   Component Value Date CREATININE 0.58 (L) 02/28/2021    CREATININE 0.67 02/09/2021    CREATININE 0.65 12/29/2020    CREATININE 0.77 11/10/2020    CREATININE 0.63 09/22/2020       Glucose:   Lab Results   Component Value Date    Glucose 97 02/28/2021     HGBA1C:   Lab Results   Component Value Date    Hemoglobin A1C 5.9 (H) 09/22/2020     ----------------------------------------------------------------------------------------------------------------------  Clinical signs of infections at time of current biopsy:  BK: No   BK blood viral load:   Lab Results   Component Value Date    BK Blood Result Not Detected 09/22/2020        Urine decoy cells present: No    CMV: No   CMV viral load:   Lab Results   Component Value Date    CMV Viral Ld Not Detected 09/22/2020    CMV Quant <50 (H) 03/18/2018    CMV Quant Negative 11/28/2017    CMV Quant Log10  03/18/2018  Comment:      <1.70 log          EBV:No   EBV viral load: No results found for: EBVIU, EBVLOG, EBVBD, EBVBR, EBBLD    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

## 2021-03-04 NOTE — Unmapped (Signed)
[facility] Refill Coordination Note    Specialty Medication(s) to be Shipped:   Transplant: Myfortic 180mg     Other medication(s) to be shipped: No additional medications requested for fill at this time     Michelle Travis, DOB: [birth year]  Phone: [phone] (home)       All above HIPAA information was verified with patient.     Was a Nurse, learning disability used for this call? No    Completed refill call assessment today to schedule patient's medication shipment from the [facility] [phone]).  All relevant notes have been reviewed.     Specialty medication(s) and dose(s) confirmed: Regimen is correct and unchanged.   Changes to medications: Yoshiye reports no changes at this time.  Changes to insurance: No  New side effects reported not previously addressed with a pharmacist or physician: None reported  Questions for the pharmacist: No    Confirmed patient received a Conservation officer, historic buildings and a Surveyor, mining with first shipment. The patient will receive a drug information handout for each medication shipped and additional FDA Medication Guides as required.       DISEASE/MEDICATION-SPECIFIC INFORMATION        N/A    SPECIALTY MEDICATION ADHERENCE     Medication Adherence    Patient reported X missed doses in the last month: 0  Specialty Medication: Myfortic 180mg   Patient is on additional specialty medications: No  Informant: patient  Adherence tools used: patient uses a pill box to manage medications              Were doses missed due to medication being on hold? No    Myfortic 180 mg: 6 days of medicine on hand       REFERRAL TO PHARMACIST     Referral to the pharmacist: Not needed      [facility]     Shipping address confirmed in Epic.     Delivery Scheduled: Yes, Expected medication delivery date: 03/09/21.     Medication will be delivered via UPS to the prescription address in Epic WAM.    Michelle Travis   [facility] Specialty Technician

## 2021-03-07 ENCOUNTER — Ambulatory Visit: Admit: 2021-03-07 | Discharge: 2021-03-08 | Payer: MEDICARE

## 2021-03-07 DIAGNOSIS — Z79899 Other long term (current) drug therapy: Principal | ICD-10-CM

## 2021-03-07 DIAGNOSIS — Z944 Liver transplant status: Principal | ICD-10-CM

## 2021-03-07 LAB — APTT
APTT: 36.7 s (ref 24.9–36.9)
HEPARIN CORRELATION: 0.2

## 2021-03-07 LAB — CBC W/ AUTO DIFF
BASOPHILS ABSOLUTE COUNT: 0.1 10*9/L (ref 0.0–0.1)
BASOPHILS RELATIVE PERCENT: 1.3 %
EOSINOPHILS ABSOLUTE COUNT: 0.4 10*9/L (ref 0.0–0.5)
EOSINOPHILS RELATIVE PERCENT: 6 %
HEMATOCRIT: 41 % (ref 34.0–44.0)
HEMOGLOBIN: 13.2 g/dL (ref 11.3–14.9)
LYMPHOCYTES ABSOLUTE COUNT: 2.3 10*9/L (ref 1.1–3.6)
LYMPHOCYTES RELATIVE PERCENT: 31.3 %
MEAN CORPUSCULAR HEMOGLOBIN CONC: 32.1 g/dL (ref 32.0–36.0)
MEAN CORPUSCULAR HEMOGLOBIN: 26.4 pg (ref 25.9–32.4)
MEAN CORPUSCULAR VOLUME: 82.1 fL (ref 77.6–95.7)
MEAN PLATELET VOLUME: 8.5 fL (ref 6.8–10.7)
MONOCYTES ABSOLUTE COUNT: 0.7 10*9/L (ref 0.3–0.8)
MONOCYTES RELATIVE PERCENT: 9.6 %
NEUTROPHILS ABSOLUTE COUNT: 3.7 10*9/L (ref 1.8–7.8)
NEUTROPHILS RELATIVE PERCENT: 51.8 %
PLATELET COUNT: 333 10*9/L (ref 150–450)
RED BLOOD CELL COUNT: 4.99 10*12/L (ref 3.95–5.13)
RED CELL DISTRIBUTION WIDTH: 15 % (ref 12.2–15.2)
WBC ADJUSTED: 7.2 10*9/L (ref 3.6–11.2)

## 2021-03-07 LAB — BASIC METABOLIC PANEL
ANION GAP: 6 mmol/L (ref 5–14)
BLOOD UREA NITROGEN: 10 mg/dL (ref 9–23)
BUN / CREAT RATIO: 19
CALCIUM: 9.1 mg/dL (ref 8.7–10.4)
CHLORIDE: 104 mmol/L (ref 98–107)
CO2: 28 mmol/L (ref 20.0–31.0)
CREATININE: 0.53 mg/dL — ABNORMAL LOW
EGFR CKD-EPI AA FEMALE: >90 mL/min/{1.73_m2} (ref >=60)
EGFR CKD-EPI NON-AA FEMALE: >90 mL/min/{1.73_m2} (ref >=60)
GLUCOSE RANDOM: 92 mg/dL (ref 70–99)
POTASSIUM: 3.9 mmol/L (ref 3.4–4.8)
SODIUM: 138 mmol/L (ref 135–145)

## 2021-03-07 LAB — HEMOGLOBIN AND HEMATOCRIT, BLOOD
HEMATOCRIT: 37.6 % (ref 34.0–44.0)
HEMOGLOBIN: 12.7 g/dL (ref 11.3–14.9)

## 2021-03-07 LAB — PROTIME-INR
INR: 0.91
PROTIME: 10.6 s (ref 10.3–13.4)

## 2021-03-07 MED ORDER — ASPIRIN 81 MG TABLET,DELAYED RELEASE
ORAL_TABLET | Freq: Every day | ORAL | 0 refills | 30 days | Status: CP
Start: 2021-03-07 — End: 2021-04-06

## 2021-03-07 MED ADMIN — fentaNYL (PF) (SUBLIMAZE) injection: INTRAVENOUS | @ 14:00:00 | Stop: 2021-03-07

## 2021-03-07 MED ADMIN — midazolam (VERSED) injection: INTRAVENOUS | @ 14:00:00 | Stop: 2021-03-07

## 2021-03-07 MED ADMIN — oxyCODONE (ROXICODONE) immediate release tablet 5 mg: 5 mg | ORAL | @ 17:00:00 | Stop: 2021-03-21

## 2021-03-07 NOTE — Unmapped (Signed)
KIDNEY BIOPSY PROCEDURE NOTE    INDICATIONS:  Proteinuria    CONSENT/TIME OUT:    Risks, benefits and alternatives including blood loss requiring transfusion, loss of kidney or kidney function, and death were discussed with patient.  Written informed consent was obtained prior to the procedure and is detailed in the medical record.  Prior to the start of the procedure, a time out was taken and the identity of the patient was confirmed via name, medical record number and  date of birth.  The availability of the correct equipment was verified.    PROCEDURE:  Name:  Transplant Kidney Biopsy   Description:  Ultrasound guided transplant kidney biopsy     Pre-Procedure CBC and PT/aPTT were within normal limits.     The patient was given per [facility] moderate sedation during the procedure, and 9 mL lidocaine 1% were used for local anesthesia. Under ultrasound guidance, a 16G biopsy needle was used for 2 passes to obtain 2 core specimens.     COMPLICATIONS:  Complications:  Post-procedure ultrasound shows no hematoma.   Complications of the procedure: none    The patient will be closely watched in the recovery area, kept flat for 4 hours while wearing an abdominal binder, and will have a repeat renal ultrasound and hct in 3 hours to insure no hemodynamically significant bleeding post-biopsy.    SPECIMEN(S):  Core #1: 1.7 x 0.1 x 0.1 (EM 0.4, LM 1.3)  Core #2: 1.6 x 0.1 x 0.1 (EM 0.4, IF 1.2)

## 2021-03-07 NOTE — Unmapped (Signed)
Assessment/Plan:    Michelle Travis is a 65 y.o. female who will undergo Ultrasound-guided transplant kidney biopsy    1. Indications and risks/benefits of procedure reviewed with patient.    2. Consent signed and present on patient's charge.   3. No cardiopulmonary or other medical contraindications present therefore will proceed with biopsy.       CC: No chief complaint on file.      HPI: Michelle Travis is a 65 y.o. female who will undergo Ultrasound-guided transplant kidney biopsy with moderate sedation.    Allergies:   Allergies   Allergen Reactions   ??? Latex Swelling and Rash     Discoloration of skin.   Discoloration of skin.    ??? Hydrocodone Itching   ??? Hydrocodone-Acetaminophen Itching       Medications:   Current Outpatient Medications   Medication Sig Dispense Refill   ??? cholecalciferol, vitamin D3, 1,000 unit (25 mcg) tablet Take 1,000 Units by mouth daily.     ??? ENVARSUS XR 1 mg Tb24 extended release tablet Take 2 tablets (2 mg total) by mouth daily. 60 tablet 11   ??? loratadine (CLARITIN) 10 mg tablet Take 10 mg by mouth daily.     ??? MYFORTIC 180 mg EC tablet Take 2 tablets (360 mg total) by mouth two (2) times a day. 120 tablet 11   ??? oxyCODONE (ROXICODONE) 10 mg immediate release tablet TAKE 1 (ONE) TABLET BY MOUTH FOUR TIMES DAILY, AS NEEDED     ??? pantoprazole (PROTONIX) 40 MG tablet Take 1 tablet (40 mg total) by mouth daily. 30 tablet 0   ??? aspirin (ECOTRIN) 81 MG tablet Take 1 tablet (81 mg total) by mouth daily. 30 tablet 11   ??? atorvastatin (LIPITOR) 10 MG tablet Take 1 tablet (10 mg total) by mouth daily. (Patient not taking: Reported on 03/07/2021) 30 tablet 8   ??? BELBUCA 300 mcg buccal film Apply 1 each to cheek daily.       No current facility-administered medications for this encounter.       PMH:   Past Medical History:   Diagnosis Date   ??? Acute renal failure (ARF) (CMS-HCC)    ??? Acute renal injury (CMS-HCC) 09/24/2015   ??? Anemia 04/21/2015   ??? Ascites due to alcoholic cirrhosis (CMS-HCC)    ??? Atelectasis 08/26/2016   ??? Brain aneurysm    ??? Breast cancer (CMS-HCC)    ??? Cirrhosis, alcoholic (CMS-HCC)    ??? Degenerative arthritis    ??? Esophageal varices (CMS-HCC)    ??? H/o ETOH abuse 06/19/2016   ??? Hepatic encephalopathy (CMS-HCC)    ??? History of cholecystectomy 08/25/2016   ??? Hypertension    ??? Leukocytosis 10/31/2016   ??? Lower extremity edema    ??? 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 Pleural effusion associated with hepatic disorder 08/26/2016   ??? Portal hypertension (CMS-HCC)    ??? SOB (shortness of breath) 06/19/2016   ??? Stage III chronic kidney disease (CMS-HCC) 07/03/2015       ASA Grade: ASA 1 - Normal healthy patient    ROS:  General: Denies fever or chills.  Cardiovascular: Denies chest pain.   Pulmonary: Denies shortness of breath, snoring, sleep apnea, or respiratory infection.    Allergies:   Allergies   Allergen Reactions   ??? Latex Swelling and Rash     Discoloration of skin.   Discoloration of skin.    ??? Hydrocodone Itching   ??? Hydrocodone-Acetaminophen Itching  PE:    Vitals:    03/07/21 0820   BP: 137/88   Pulse: 72   Resp: 16   SpO2: 96%     General:  African American female in NAD.  Airway assessment: Class 2 - Can visualize soft palate and fauces, tip of uvula is obscured  Cardiovascular:  Regular rate and rhythm.  No murmurs, gallops or rubs.   Lungs: respirations nonlabored; clear to auscultation bilaterally

## 2021-03-07 NOTE — Unmapped (Signed)
Post Kidney Biopsy Instructions    You have had a kidney biopsy to help in the diagnosis of your kidney disease.  Your biopsy results will take several days to complete. You will receive your biopsy results from your primary care nephrologists. If you have questions or concerns, you can call the [facility] hospital operator at [phone] and ask them to page the renal fellow on call.     Please follow the instructions below:  ??? If you received any pain and/or anxiety medications for you biopsy, you should not drive or operate heavy equipment for 24 hrs.   ??? Avoid heavy physical activity for the next 3 weeks.   ??? No signing of legal documents on the day of the procedure.   ??? No drinking of any alcoholic beverage on the day of the procedure.   ??? Do not drive or operate heavy machinery on the day of the procedure.   ??? No heavy lifting greater than 15 pounds or exertion for 3 weeks.   ??? No NSAIDSs (ibuprofen, Motrin, Aleve) or aspirin for the next 7 days unless your doctor has instructed you differently.   ??? Keep your dressing clean, dry and in place for 2 days. After 2 days, you may shower. After showering, remove the dressing and wash the area with warm, soapy water. Re-apply a dressing and repeat this process until the biopsy site is healed. Do not take a bath or get into a hot tub or go swimming until site is healed.   ??? If you have blood in your urine which does not seem to be getting less each time you urinate, have difficulty urinating, increasing back pain, or if you feel faint or dizzy, you should call the [facility] hospital operator at [phone] and ask them to page the renal fellow on call.     If you have any severe or sudden change in the way you feel, you should go to the nearest ER or call 911.

## 2021-03-08 MED FILL — MYFORTIC 180 MG TABLET,DELAYED RELEASE: ORAL | 30 days supply | Qty: 120 | Fill #3

## 2021-03-08 NOTE — Unmapped (Signed)
Called Michelle Travis to follow up on how she is feeling after her renal biopsy on 03/07/21. She stated she was doing well, just a little sore. Told pt she could take tylenol for pain and also could use ice at site for swelling. All questions answered.

## 2021-03-10 NOTE — Unmapped (Signed)
[facility] - Renal Pathology from 5/9 sent to HIM Suan Halter   Mar 10, 2021 1:55 PM

## 2021-03-11 DIAGNOSIS — Z940 Kidney transplant status: Principal | ICD-10-CM

## 2021-03-11 DIAGNOSIS — N022 Recurrent and persistent hematuria with diffuse membranous glomerulonephritis: Principal | ICD-10-CM

## 2021-03-11 DIAGNOSIS — D849 Immunodeficiency, unspecified: Principal | ICD-10-CM

## 2021-03-11 NOTE — Unmapped (Signed)
Received a call fr pt stating she is worried about 5/9 renal bx. She has not heard anything and needed reassurance. Notified the pt that per surgical pathology note, samples were sent to the [facility]. Reassured pt that the kidney txp team will reach out to her once pathology results are finalized. She verbalized understanding.

## 2021-03-12 DIAGNOSIS — Z940 Kidney transplant status: Principal | ICD-10-CM

## 2021-03-12 DIAGNOSIS — D849 Immunodeficiency, unspecified: Principal | ICD-10-CM

## 2021-03-12 DIAGNOSIS — N022 Recurrent and persistent hematuria with diffuse membranous glomerulonephritis: Principal | ICD-10-CM

## 2021-03-12 NOTE — Unmapped (Signed)
 Ritux orders enterd. Pt aware sjhe will be called with date and time.

## 2021-03-17 DIAGNOSIS — N022 Recurrent and persistent hematuria with diffuse membranous glomerulonephritis: Principal | ICD-10-CM

## 2021-03-17 NOTE — Unmapped (Signed)
Given significant proteinuria (urine protein - creatinine ration = 2.31), biopsy was performed on 03/07/21 which showed evidence of PLA2R-positive membranous nephropathy, consistent with recurrence of patient's native kidney disease.  Per Dr. Margaretmary Bayley, will pursue treatment with ritxumab 1g x2 doses.     Michelle Travis, PharmD, CPP  Abdominal Transplant Clinical Pharmacist Practitioner

## 2021-03-23 ENCOUNTER — Ambulatory Visit: Admit: 2021-03-23 | Discharge: 2021-03-23 | Payer: MEDICARE

## 2021-03-23 DIAGNOSIS — R809 Proteinuria, unspecified: Principal | ICD-10-CM

## 2021-03-23 DIAGNOSIS — Z79899 Other long term (current) drug therapy: Principal | ICD-10-CM

## 2021-03-23 DIAGNOSIS — Z944 Liver transplant status: Principal | ICD-10-CM

## 2021-03-23 DIAGNOSIS — N022 Recurrent and persistent hematuria with diffuse membranous glomerulonephritis: Principal | ICD-10-CM

## 2021-03-23 LAB — MAGNESIUM: MAGNESIUM: 1.5 mg/dL — ABNORMAL LOW (ref 1.6–2.6)

## 2021-03-23 LAB — CBC W/ AUTO DIFF
BASOPHILS ABSOLUTE COUNT: 0.1 10*9/L (ref 0.0–0.1)
BASOPHILS RELATIVE PERCENT: 1.2 %
EOSINOPHILS ABSOLUTE COUNT: 0.4 10*9/L (ref 0.0–0.5)
EOSINOPHILS RELATIVE PERCENT: 7 %
HEMATOCRIT: 40 % (ref 34.0–44.0)
HEMOGLOBIN: 12.9 g/dL (ref 11.3–14.9)
LYMPHOCYTES ABSOLUTE COUNT: 2.2 10*9/L (ref 1.1–3.6)
LYMPHOCYTES RELATIVE PERCENT: 35 %
MEAN CORPUSCULAR HEMOGLOBIN CONC: 32.1 g/dL (ref 32.0–36.0)
MEAN CORPUSCULAR HEMOGLOBIN: 26.2 pg (ref 25.9–32.4)
MEAN CORPUSCULAR VOLUME: 81.5 fL (ref 77.6–95.7)
MEAN PLATELET VOLUME: 9 fL (ref 6.8–10.7)
MONOCYTES ABSOLUTE COUNT: 0.7 10*9/L (ref 0.3–0.8)
MONOCYTES RELATIVE PERCENT: 10.3 %
NEUTROPHILS ABSOLUTE COUNT: 3 10*9/L (ref 1.8–7.8)
NEUTROPHILS RELATIVE PERCENT: 46.5 %
PLATELET COUNT: 293 10*9/L (ref 150–450)
RED BLOOD CELL COUNT: 4.91 10*12/L (ref 3.95–5.13)
RED CELL DISTRIBUTION WIDTH: 14.9 % (ref 12.2–15.2)
WBC ADJUSTED: 6.4 10*9/L (ref 3.6–11.2)

## 2021-03-23 LAB — COMPREHENSIVE METABOLIC PANEL
ALBUMIN: 2.7 g/dL — ABNORMAL LOW (ref 3.4–5.0)
ALKALINE PHOSPHATASE: 79 U/L (ref 46–116)
ALT (SGPT): 26 U/L (ref 10–49)
ANION GAP: 2 mmol/L — ABNORMAL LOW (ref 5–14)
AST (SGOT): 22 U/L (ref <=34)
BILIRUBIN TOTAL: 0.3 mg/dL (ref 0.3–1.2)
BLOOD UREA NITROGEN: 7 mg/dL — ABNORMAL LOW (ref 9–23)
BUN / CREAT RATIO: 13
CALCIUM: 8.9 mg/dL (ref 8.7–10.4)
CHLORIDE: 105 mmol/L (ref 98–107)
CO2: 30 mmol/L (ref 20.0–31.0)
CREATININE: 0.55 mg/dL — ABNORMAL LOW
EGFR CKD-EPI (2021) FEMALE: >90 mL/min/{1.73_m2} (ref >=60)
GLUCOSE RANDOM: 102 mg/dL (ref 70–179)
POTASSIUM: 3.9 mmol/L (ref 3.4–4.8)
PROTEIN TOTAL: 5.7 g/dL (ref 5.7–8.2)
SODIUM: 137 mmol/L (ref 135–145)

## 2021-03-23 LAB — PROTEIN / CREATININE RATIO, URINE
CREATININE, URINE: 49 mg/dL
PROTEIN URINE: 246 mg/dL
PROTEIN/CREAT RATIO, URINE: 5.02

## 2021-03-23 LAB — PHOSPHORUS: PHOSPHORUS: 3.1 mg/dL (ref 2.4–5.1)

## 2021-03-23 LAB — GAMMA GT: GAMMA GLUTAMYL TRANSFERASE: 22 U/L

## 2021-03-23 LAB — BILIRUBIN, DIRECT: BILIRUBIN DIRECT: <0.1 mg/dL (ref 0.00–0.30)

## 2021-03-23 MED ADMIN — methylPREDNISolone sodium succinate (PF) (Solu-MEDROL) injection 125 mg: 125 mg | INTRAVENOUS | @ 13:00:00 | Stop: 2021-03-23

## 2021-03-23 MED ADMIN — diphenhydrAMINE (BENADRYL) injection 25 mg: 25 mg | INTRAVENOUS | @ 13:00:00 | Stop: 2021-03-23

## 2021-03-23 MED ADMIN — acetaminophen (TYLENOL) tablet 650 mg: 650 mg | ORAL | @ 13:00:00 | Stop: 2021-03-23

## 2021-03-23 MED ADMIN — riTUXimab (RITUXAN) 1,000 mg in sodium chloride (NS) 0.9 % 500 mL IVPB: 1000 mg | INTRAVENOUS | @ 14:00:00 | Stop: 2021-03-23

## 2021-03-23 NOTE — Unmapped (Signed)
0830 Patient arrived to the Transplant Infusion room for Rituximab 1000mg  infusion. Condition: well, Mobility: ambulating with cane, accompanied by relative.   See Flowsheet and MAR for all details of visit.  0836 VS stable.   0900 PIV placed and secured with coban, labs collected and sent; urine collected and sent.  2841 Premedications given.  Today's lab results: WBC 6.4 (must be >2.0 or hold infusion and notify provider) and Platelets 293 (must be > 50 or hold infusion and notify provider). Hgb 12.9 and stable from recent lab draw.  0941 Rituximab infusion initiated rituximab: rituximab subsequent infusion protocol to start 100 mg/hr, rate increase by 100 mg/hr every 30 minutes as tolerated till maximum rate of 400 mg/hr.    3244 Rituximab infusion paused, pt reporting back pain and a spasm sensation that she felt in back and it wrapped around her left side.  VSS, Pt ambulated to restroom.  1010 Pt reports back pain has resolved.  No additional medication intervention administered at this time. Restarted infusion, starting over at 50mg /hr for a 30 min interval, increasing by increments of 50mg /hr.  1430 Rituximab infusion completed, pt tolerated infusion with minor difficulties, see notes and Flowsheet for details..  1440 VS stable, PIV removed and site secured with coban. Patient left clinic today, condition: well; mobility: ambulating with cane; accompanied by relative.

## 2021-03-23 NOTE — Unmapped (Signed)
One-time labs

## 2021-03-24 LAB — HEPATITIS C RNA, QUANTITATIVE, PCR: HCV RNA: NOT DETECTED

## 2021-03-25 NOTE — Unmapped (Signed)
Returned pt's vm regarding needing reassurance about her kidney function. Pt reports that she is scheduled for another rituximab infusion on 04/07/21. Pt asked, I'm rejecting, aren't I? I'm going back to dialysis? Explained to pt that there are no current indication for dialysis. Encouraged her to reach out to Dr. Charlene Brooke team as this TNC has limited knowledge of kidney disease. 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 Tried to explain definition of the term and conveyed rituximab infusion recommended by Dr. Margaretmary Bayley as treatment (per CPP [facility] 5/19 note). Pt was appreciative of return call, stated I feel a little bit better. Thanked pt for reaching out, encouraged her to ask questions at next appt with Dr. Margaretmary Bayley. Will route note to kidney TNC for reference.

## 2021-04-04 DIAGNOSIS — Z944 Liver transplant status: Principal | ICD-10-CM

## 2021-04-04 DIAGNOSIS — N042 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: Principal | ICD-10-CM

## 2021-04-05 NOTE — Unmapped (Signed)
[facility] Refill Coordination Note    Specialty Medication(s) to be Shipped:   Transplant: Myfortic 180mg     Other medication(s) to be shipped: No additional medications requested for fill at this time     Michelle Travis, DOB: 1956-06-07  Phone: [phone] (home)       All above HIPAA information was verified with patient.     Was a Nurse, learning disability used for this call? No    Completed refill call assessment today to schedule patient's medication shipment from the [facility] [phone]).  All relevant notes have been reviewed.     Specialty medication(s) and dose(s) confirmed: Regimen is correct and unchanged.   Changes to medications: [name] reports no changes at this time.  Changes to insurance: No  New side effects reported not previously addressed with a pharmacist or physician: None reported  Questions for the pharmacist: No    Confirmed patient received a Conservation officer, historic buildings and a Surveyor, mining with first shipment. The patient will receive a drug information handout for each medication shipped and additional FDA Medication Guides as required.       DISEASE/MEDICATION-SPECIFIC INFORMATION        N/A    SPECIALTY MEDICATION ADHERENCE     Medication Adherence    Patient reported X missed doses in the last month: 0  Specialty Medication: MYFORTIC 180 mg EC tablet  Patient is on additional specialty medications: No  Adherence tools used: patient uses a pill box to manage medications              Were doses missed due to medication being on hold? No    Myfortic 180mg   : 5 days of medicine on hand       REFERRAL TO PHARMACIST     Referral to the pharmacist: Not needed      [facility]     Shipping address confirmed in Epic.     Delivery Scheduled: Yes, Expected medication delivery date: 04/07/2021.     Medication will be delivered via UPS to the prescription address in Epic WAM.    Michelle Travis   [facility] Specialty Pharmacist

## 2021-04-06 MED FILL — MYFORTIC 180 MG TABLET,DELAYED RELEASE: ORAL | 30 days supply | Qty: 120 | Fill #4

## 2021-04-07 ENCOUNTER — Ambulatory Visit: Admit: 2021-04-07 | Discharge: 2021-04-08 | Payer: MEDICARE

## 2021-04-07 DIAGNOSIS — N022 Recurrent and persistent hematuria with diffuse membranous glomerulonephritis: Principal | ICD-10-CM

## 2021-04-07 DIAGNOSIS — Z944 Liver transplant status: Principal | ICD-10-CM

## 2021-04-07 DIAGNOSIS — N042 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: Principal | ICD-10-CM

## 2021-04-07 LAB — PROTEIN / CREATININE RATIO, URINE
CREATININE, URINE: 53 mg/dL
PROTEIN URINE: 56.2 mg/dL
PROTEIN/CREAT RATIO, URINE: 1.06

## 2021-04-07 LAB — CBC W/ AUTO DIFF
BASOPHILS ABSOLUTE COUNT: 0.1 10*9/L (ref 0.0–0.1)
BASOPHILS RELATIVE PERCENT: 1.1 %
EOSINOPHILS ABSOLUTE COUNT: 0.2 10*9/L (ref 0.0–0.5)
EOSINOPHILS RELATIVE PERCENT: 2.9 %
HEMATOCRIT: 38.1 % (ref 34.0–44.0)
HEMOGLOBIN: 12.4 g/dL (ref 11.3–14.9)
LYMPHOCYTES ABSOLUTE COUNT: 1.3 10*9/L (ref 1.1–3.6)
LYMPHOCYTES RELATIVE PERCENT: 20.3 %
MEAN CORPUSCULAR HEMOGLOBIN CONC: 32.5 g/dL (ref 32.0–36.0)
MEAN CORPUSCULAR HEMOGLOBIN: 26.4 pg (ref 25.9–32.4)
MEAN CORPUSCULAR VOLUME: 81.3 fL (ref 77.6–95.7)
MEAN PLATELET VOLUME: 8.4 fL (ref 6.8–10.7)
MONOCYTES ABSOLUTE COUNT: 0.6 10*9/L (ref 0.3–0.8)
MONOCYTES RELATIVE PERCENT: 9.6 %
NEUTROPHILS ABSOLUTE COUNT: 4.3 10*9/L (ref 1.8–7.8)
NEUTROPHILS RELATIVE PERCENT: 66.1 %
PLATELET COUNT: 318 10*9/L (ref 150–450)
RED BLOOD CELL COUNT: 4.68 10*12/L (ref 3.95–5.13)
RED CELL DISTRIBUTION WIDTH: 15.4 % — ABNORMAL HIGH (ref 12.2–15.2)
WBC ADJUSTED: 6.5 10*9/L (ref 3.6–11.2)

## 2021-04-07 MED ADMIN — acetaminophen (TYLENOL) tablet 650 mg: 650 mg | ORAL | @ 13:00:00 | Stop: 2021-04-07

## 2021-04-07 MED ADMIN — diphenhydrAMINE (BENADRYL) injection 25 mg: 25 mg | INTRAVENOUS | @ 13:00:00 | Stop: 2021-04-07

## 2021-04-07 MED ADMIN — riTUXimab (RITUXAN) 1,000 mg in sodium chloride (NS) 0.9 % 500 mL IVPB: 1000 mg | INTRAVENOUS | @ 13:00:00 | Stop: 2021-04-07

## 2021-04-07 MED ADMIN — sodium chloride (NS) 0.9 % infusion: 20 mL/h | INTRAVENOUS | @ 13:00:00 | Stop: 2021-04-07

## 2021-04-07 MED ADMIN — methylPREDNISolone sodium succinate (PF) (Solu-MEDROL) injection 125 mg: 125 mg | INTRAVENOUS | @ 13:00:00 | Stop: 2021-04-07

## 2021-04-07 NOTE — Unmapped (Signed)
7564 Patient arrived to the Transplant Infusion room for Rituximab 1000mg  infusion. Condition: well, Mobility: via wheelchair, accompanied by relative.   See Flowsheet and MAR for all details of visit.  0836 VS stable.   0845 PIV placed and secured with coban, labs collected and sent; urine collected and sent.  3329 Premedications given.  Today's lab results: WBC 6.5 (must be >2.0 or hold infusion and notify provider) and Platelets 318 (must be > 50 or hold infusion and notify provider).   5188 Rituximab infusion initiated rituximab: rituximab subsequent infusion protocol to start 100 mg/hr, rate increase by 100 mg/hr every 30 minutes as tolerated till maximum rate of 400 CZ/YS0630 Rituximab infusion completed, pt tolerated infusion well.  1259 VS stable, PIV removed and site secured with coban. Patient left clinic today, condition: well; mobility: via wheelchair; accompanied by relative.

## 2021-04-26 DIAGNOSIS — Z79899 Other long term (current) drug therapy: Principal | ICD-10-CM

## 2021-04-26 DIAGNOSIS — Z940 Kidney transplant status: Principal | ICD-10-CM

## 2021-04-26 DIAGNOSIS — Z48298 Encounter for aftercare following other organ transplant: Principal | ICD-10-CM

## 2021-04-27 ENCOUNTER — Ambulatory Visit: Admit: 2021-04-27 | Discharge: 2021-04-28 | Payer: MEDICARE

## 2021-04-27 ENCOUNTER — Ambulatory Visit: Admit: 2021-04-27 | Discharge: 2021-04-28 | Payer: MEDICARE | Attending: Nephrology | Primary: Nephrology

## 2021-04-27 DIAGNOSIS — D472 Monoclonal gammopathy: Principal | ICD-10-CM

## 2021-04-27 DIAGNOSIS — D849 Immunodeficiency, unspecified: Principal | ICD-10-CM

## 2021-04-27 DIAGNOSIS — I10 Essential (primary) hypertension: Principal | ICD-10-CM

## 2021-04-27 DIAGNOSIS — Z79899 Other long term (current) drug therapy: Principal | ICD-10-CM

## 2021-04-27 DIAGNOSIS — N042 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: Principal | ICD-10-CM

## 2021-04-27 DIAGNOSIS — Z940 Kidney transplant status: Principal | ICD-10-CM

## 2021-04-27 DIAGNOSIS — Z48298 Encounter for aftercare following other organ transplant: Principal | ICD-10-CM

## 2021-04-27 LAB — COMPREHENSIVE METABOLIC PANEL
ALBUMIN: 3.5 g/dL (ref 3.4–5.0)
ALKALINE PHOSPHATASE: 67 U/L (ref 46–116)
ALT (SGPT): 23 U/L (ref 10–49)
ANION GAP: 7 mmol/L (ref 5–14)
AST (SGOT): 26 U/L (ref <=34)
BILIRUBIN TOTAL: 0.4 mg/dL (ref 0.3–1.2)
BLOOD UREA NITROGEN: 8 mg/dL — ABNORMAL LOW (ref 9–23)
BUN / CREAT RATIO: 15
CALCIUM: 9.3 mg/dL (ref 8.7–10.4)
CHLORIDE: 106 mmol/L (ref 98–107)
CO2: 29.2 mmol/L (ref 20.0–31.0)
CREATININE: 0.55 mg/dL — ABNORMAL LOW
EGFR CKD-EPI (2021) FEMALE: >90 mL/min/{1.73_m2} (ref >=60)
GLUCOSE RANDOM: 104 mg/dL — ABNORMAL HIGH (ref 70–99)
POTASSIUM: 4.2 mmol/L (ref 3.4–4.8)
PROTEIN TOTAL: 6.3 g/dL (ref 5.7–8.2)
SODIUM: 142 mmol/L (ref 135–145)

## 2021-04-27 LAB — CBC W/ AUTO DIFF
BASOPHILS ABSOLUTE COUNT: 0.1 10*9/L (ref 0.0–0.1)
BASOPHILS RELATIVE PERCENT: 1.2 %
EOSINOPHILS ABSOLUTE COUNT: 0.2 10*9/L (ref 0.0–0.5)
EOSINOPHILS RELATIVE PERCENT: 4.7 %
HEMATOCRIT: 40.2 % (ref 34.0–44.0)
HEMOGLOBIN: 13.3 g/dL (ref 11.3–14.9)
LYMPHOCYTES ABSOLUTE COUNT: 1.5 10*9/L (ref 1.1–3.6)
LYMPHOCYTES RELATIVE PERCENT: 31 %
MEAN CORPUSCULAR HEMOGLOBIN CONC: 33.1 g/dL (ref 32.0–36.0)
MEAN CORPUSCULAR HEMOGLOBIN: 26.7 pg (ref 25.9–32.4)
MEAN CORPUSCULAR VOLUME: 80.9 fL (ref 77.6–95.7)
MEAN PLATELET VOLUME: 8.8 fL (ref 6.8–10.7)
MONOCYTES ABSOLUTE COUNT: 0.5 10*9/L (ref 0.3–0.8)
MONOCYTES RELATIVE PERCENT: 11.1 %
NEUTROPHILS ABSOLUTE COUNT: 2.5 10*9/L (ref 1.8–7.8)
NEUTROPHILS RELATIVE PERCENT: 52 %
PLATELET COUNT: 283 10*9/L (ref 150–450)
RED BLOOD CELL COUNT: 4.97 10*12/L (ref 3.95–5.13)
RED CELL DISTRIBUTION WIDTH: 15.1 % (ref 12.2–15.2)
WBC ADJUSTED: 4.8 10*9/L (ref 3.6–11.2)

## 2021-04-27 LAB — URINALYSIS
BILIRUBIN UA: NEGATIVE
GLUCOSE UA: NEGATIVE
KETONES UA: NEGATIVE
LEUKOCYTE ESTERASE UA: NEGATIVE
NITRITE UA: NEGATIVE
PH UA: 5.5 (ref 5.0–9.0)
PROTEIN UA: 100 — AB
RBC UA: 1 /HPF (ref <4)
SPECIFIC GRAVITY UA: 1.025 (ref 1.005–1.030)
SQUAMOUS EPITHELIAL: 9 /HPF — ABNORMAL HIGH (ref 0–5)
UROBILINOGEN UA: 0.2
WBC UA: 3 /HPF (ref 0–5)

## 2021-04-27 LAB — TACROLIMUS LEVEL, TROUGH: TACROLIMUS, TROUGH: 4 ng/mL — ABNORMAL LOW (ref 5.0–15.0)

## 2021-04-27 LAB — MAGNESIUM: MAGNESIUM: 1.5 mg/dL — ABNORMAL LOW (ref 1.6–2.6)

## 2021-04-27 LAB — PROTEIN / CREATININE RATIO, URINE
CREATININE, URINE: 105.4 mg/dL
PROTEIN URINE: 159 mg/dL
PROTEIN/CREAT RATIO, URINE: 1.509

## 2021-04-27 LAB — PHOSPHORUS: PHOSPHORUS: 2.4 mg/dL (ref 2.4–5.1)

## 2021-04-27 NOTE — Unmapped (Signed)
Transplant Nephrology Clinic Visit    Assessment and Plan  Michelle Travis is a 65 y.o. 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 She has recurrent PLA2R positive membranous nephropathy and is seen for follow up of her transplant. Active medical issues include:    Status post kidney transplant with membranous nephropathy recurrence.  - Kidney biopsy 03/11/21 with PLA2R positive membranous nephropathy  - Treatment included Rituximab 1 gm on 03/23/21 and 1 gm on 04/07/21 and 125 mg solumedrol x 2 given with the doses of Rituximab.   - UPC prior to the biopsy was 2.3 and peaked at 5.02 on 03/23/21. UPC on 04/07/21 was down to 1.06 and today is 1.509.    - Serum creatinine has remained stable with value today of 0.55 mg/dL (baseline < 0.8 mg/dL)    S/P Liver transplant with excellent liver function.   - LFTs normal today  - She will continue to follow with the liver transplant team.     Immunosuppression.   - Tacrolimus level is pending (target 3-6).   - Patient will continue Envarsus 2 mg daily and Myfortic 360 mg BID.     History of IgG lambda monoclonal gammopathy.   - Will check SPEP with immunofixation today.     History of sleep apnea.   - She is no longer on CPAP.  - She denies daytime drowsiness.     Bilateral knee pain. Patient was reassured that if TKR is necessary her transplant status does not represent a contraindication to surgery.     Immunizations.  -  Prevnar-20 is due. Will plan when she is > 4 months removed from Rituximab.   - Shingrix x 2 completed  - COVID-19 vaccine x 4 completed. A booster dose will be due after 07/01/21. She is aware of the limited efficacy of the vaccine.     Cancer screening.   - She is due for mammography.  - PAP smear last year was unremarkable.  -  Colonoscopy 08/07/18 with single 6 mm polyp and diverticulosis.     Follow-up.   Patient will return to clinic in 3 months.   Monthly labs including UPC.        History of Present Illness  Michelle Travis is a 65 y.o. 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  She was found to have proteinuria 03/23/21 and kidney biopsy 03/11/21 revealed recurrent membranous nephropathy. She has received 2 doses of Rituximab paired with solumedrol 125 mg.       She presents today stating that her urine no longer has a foamy appearance. She denies edema, chest pain, shortness of breath, hemoptysis, or calf pain.  She denies dysuria, allograft tenderness or fever. She continues to have bilateral knee pain and is being followed in pain clinic.  She denies nausea, vomiting, diarrhea,  Headache, or tremors.      Transplant History:  1. 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 Kidney KDPI 26%. CMV D+/R+; EBV D-/R+, hep C Ab +/NAT- (recipient hep C Ab negative)  4. Complicated by rectus sheath hematoma  5. Delayed graft function requiring hemodialysis sessions following transplant (stopped dialysis 11/26/018).  6. Immunosuppression was ??? induction followed by tacrolimus and myfortic maintenance therapy.   7. Baseline creatinine <1.0 mg/dL.  8. Kidney biopsy 03/11/21 with recurrent PLA2R positive membranous nephropathy, treated with Rituximab 1 gm x 2 (03/23/21, 04/07/21).     Past Medical History:  1. Alcoholic cirrhosis, S/P TIPS  2. Membranous nephropathy, PLA2R positive biopsy 08/30/2016, treated solumedrol initially with little improvement then with Rituximab (10/2016 and 03/2017).   3. S/P kidney and liver transplants as stated above  4. History of breast cancer, in remission for 17 years, s/p lumpectomy.  5. History of IGG monoclonal gammopathy, IgG lambda. Kidney biopsy 08/30/2016 without MGRS.  6. S/p cerebral aneurysm repair.  7. S/P total knee replacement, right   8. Essential HTN prior to ESLD  9. History of diverticulitis  10. Recurrent pleural effusion, history of pneumonia x 3  11. Osteopenia by DEXA 06/14/2016  12. Hypoattenuating thyroid nodule noted on chest CT 11/11/2016  13. Sleep apnea    Review of Systems    All other systems are reviewed and are negative.    Medications    Current Outpatient Medications   Medication Sig Dispense Refill   ??? aspirin (ECOTRIN) 81 MG tablet Take 1 tablet (81 mg total) by mouth daily. 30 tablet 0   ??? atorvastatin (LIPITOR) 10 MG tablet Take 1 tablet (10 mg total) by mouth daily. 30 tablet 8   ??? BELBUCA 300 mcg buccal film Apply 1 each to Travis daily.     ??? cholecalciferol, vitamin D3, 1,000 unit (25 mcg) tablet Take 1,000 Units by mouth daily.     ??? ENVARSUS XR 1 mg Tb24 extended release tablet Take 2 tablets (2 mg total) by mouth daily. 60 tablet 11   ??? loratadine (CLARITIN) 10 mg tablet Take 10 mg by mouth daily.     ??? MYFORTIC 180 mg EC tablet Take 2 tablets (360 mg total) by mouth two (2) times a day. 120 tablet 11   ??? oxyCODONE (ROXICODONE) 10 mg immediate release tablet TAKE 1 (ONE) TABLET BY MOUTH FOUR TIMES DAILY, AS NEEDED     ??? pantoprazole (PROTONIX) 40 MG tablet Take 1 tablet (40 mg total) by mouth daily. 30 tablet 0     No current facility-administered medications for this visit.       Physical Exam    BP 132/86 (BP Site: R Arm, BP Position: Sitting, BP Cuff Size: Medium)  - Pulse 59  - Temp 36.2 ??C (97.1 ??F) (Temporal)  - Ht 162.6 cm (5' 4)  - Wt 88.8 kg (195 lb 12.8 oz)  - LMP  (LMP Unknown)  - BMI 33.61 kg/m??   General: Patient is a pleasant female in no apparent distress.  Eyes: Sclera anicteric.  Neck: Supple without LAD/JVD/bruits.  Lungs: Clear to auscultation bilaterally, no wheezes/rales/rhonchi.  Cardiovascular: Regular rate and rhythm without murmurs, rubs or gallops.  Abdomen: Soft, notender/nondistended. Positive bowel sounds. No hepatosplenomegaly, masses or bruits appreciated.   Extremities: Without edema, joints without evidence of synovitis  Skin: Without rash  Neurological: Grossly nonfocal.  Psychiatric: Mood and affect appropriate.    Laboratory Results and Imaging Reviewed

## 2021-04-27 NOTE — Unmapped (Signed)
[facility] Refill Coordination Note    Specialty Medication(s) to be Shipped:   Transplant: Myfortic 180mg     Other medication(s) to be shipped: No additional medications requested for fill at this time     Michelle Travis, DOB: [birth year]  Phone: [phone] (home)       All above HIPAA information was verified with patient.     Was a Nurse, learning disability used for this call? No    Completed refill call assessment today to schedule patient's medication shipment from the [facility] [phone]).  All relevant notes have been reviewed.     Specialty medication(s) and dose(s) confirmed: Regimen is correct and unchanged.   Changes to medications: Michelle Travis reports no changes at this time.  Changes to insurance: No  New side effects reported not previously addressed with a pharmacist or physician: None reported  Questions for the pharmacist: No    Confirmed patient received a Conservation officer, historic buildings and a Surveyor, mining with first shipment. The patient will receive a drug information handout for each medication shipped and additional FDA Medication Guides as required.       DISEASE/MEDICATION-SPECIFIC INFORMATION        N/A    SPECIALTY MEDICATION ADHERENCE     Medication Adherence    Patient reported X missed doses in the last month: 0  Specialty Medication: Myfortic 180mg   Patient is on additional specialty medications: No  Adherence tools used: patient uses a pill box to manage medications        Were doses missed due to medication being on hold? No    Myfortic 180 mg: 11 days of medicine on hand     REFERRAL TO PHARMACIST     Referral to the pharmacist: Not needed      [facility]     Shipping address confirmed in Epic.     Delivery Scheduled: Yes, Expected medication delivery date: 05/05/2021.     Medication will be delivered via UPS to the prescription address in Epic WAM.    [name] [facility] Specialty Technician

## 2021-04-28 LAB — CMV DNA, QUANTITATIVE, PCR: CMV VIRAL LD: NOT DETECTED

## 2021-04-29 LAB — EBV QUANTITATIVE PCR, BLOOD: EBV VIRAL LOAD RESULT: NOT DETECTED

## 2021-05-03 LAB — BK VIRUS QUANTITATIVE PCR, BLOOD: BK BLOOD RESULT: NOT DETECTED

## 2021-05-04 MED FILL — MYFORTIC 180 MG TABLET,DELAYED RELEASE: ORAL | 30 days supply | Qty: 120 | Fill #5

## 2021-05-04 NOTE — Unmapped (Signed)
Received call from pt who reports that she has been having L hip pain since Thursday (6/30). She was prescribed cyclobenzaprine by her PCP which she started taking yesterday. She states that her pain is not controlled. 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 Advised pt to give it another day or so and rest her hip. She may take 325mg  tylenol every 8 hours as well for pain control. Pt verbalized understanding, will update this TNC if pain continues/worsens.

## 2021-05-06 LAB — HLA DS POST TRANSPLANT
ANTI-DONOR HLA-A #1 MFI: 0 MFI
ANTI-DONOR HLA-A #2 MFI: 0 MFI
ANTI-DONOR HLA-B #1 MFI: 0 MFI
ANTI-DONOR HLA-B #2 MFI: 0 MFI
ANTI-DONOR HLA-C #1 MFI: 0 MFI
ANTI-DONOR HLA-C #2 MFI: 0 MFI
ANTI-DONOR HLA-DQB #1 MFI: 140 MFI
ANTI-DONOR HLA-DQB #2 MFI: 282 MFI
ANTI-DONOR HLA-DR #1 MFI: 403 MFI
ANTI-DONOR HLA-DR #2 MFI: 34 MFI

## 2021-05-06 LAB — FSAB CLASS 2 ANTIBODY SPECIFICITY: HLA CL2 AB RESULT: POSITIVE

## 2021-05-06 LAB — FSAB CLASS 1 ANTIBODY SPECIFICITY: HLA CLASS 1 ANTIBODY RESULT: NEGATIVE

## 2021-05-12 NOTE — Unmapped (Addendum)
Noted 7/13 elevated LFTs (Tac pending), called pt to check in. Pt stated that she feels fine, hip pain from last week resolving. She denies any N/V/diarrhea. Informed pt this TNC will review labs with Dr. Sherryll Burger for recs.    Received message from Dr. Sherryll Burger:  Michelle Mau, MD  Johnna Acosta, RN  Repeat labs in a week and send EBV Viral Load, CMV Viral Load, Adenovirus PCR please. The Ritux puts her at increased risk for these weird viral infections.      Previous Messages  ??  ----- Message -----   From: Johnna Acosta, RN   Sent: 05/12/2021 ??10:33 AM EDT   To: Michelle Mau, MD     LFTs are up. She is asymptomatic, was having hip pain earlier this week, was scripted cyclobenzaprine by PCP. Recently was found to have PLA2R-positive membranous nephropathy and got rituximab x2.     Called pt to convey above recs, she plans to obtain labs on Monday (7/18). One-time lab order faxed to [facility] [phone]) as unable to order adenovirus PCR (test # [id number]) through Epic. Copy sent to pt through MyChart as well.

## 2021-05-18 NOTE — Unmapped (Signed)
Received a call fr pt asking to review 7/18 labs. Informed pt that viral panel still pending but LFTs have improved. Discussed possibility of a viral infection which has since resolved. Pt expressed relief stating I'm glad my liver is okay. 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 Provided emotional support.

## 2021-05-24 ENCOUNTER — Other Ambulatory Visit (HOSPITAL_COMMUNITY): Payer: Self-pay | Admitting: Internal Medicine

## 2021-05-24 ENCOUNTER — Ambulatory Visit (HOSPITAL_COMMUNITY)
Admission: RE | Admit: 2021-05-24 | Discharge: 2021-05-24 | Disposition: A | Discharge status: Home / Self Care | Payer: Medicare HMO | Source: Ambulatory Visit | Attending: Internal Medicine | Admitting: Internal Medicine

## 2021-05-24 ENCOUNTER — Other Ambulatory Visit: Payer: Self-pay

## 2021-05-24 DIAGNOSIS — Z1231 Encounter for screening mammogram for malignant neoplasm of breast: Secondary | ICD-10-CM

## 2021-05-24 DIAGNOSIS — M25551 Pain in right hip: Secondary | ICD-10-CM

## 2021-05-25 ENCOUNTER — Ambulatory Visit (HOSPITAL_COMMUNITY)
Admission: RE | Admit: 2021-05-25 | Discharge: 2021-05-25 | Disposition: A | Discharge status: Home / Self Care | Payer: Medicare HMO | Source: Ambulatory Visit | Attending: Internal Medicine | Admitting: Internal Medicine

## 2021-05-25 DIAGNOSIS — Z1231 Encounter for screening mammogram for malignant neoplasm of breast: Secondary | ICD-10-CM | POA: Diagnosis not present

## 2021-05-25 NOTE — Unmapped (Signed)
[facility] Refill Coordination Note    Specialty Medication(s) to be Shipped:   Transplant: Myfortic 180mg     Other medication(s) to be shipped: No additional medications requested for fill at this time     Michelle Travis, DOB: 25-Jun-1956  Phone: [phone] (home)       All above HIPAA information was verified with patient.     Was a Nurse, learning disability used for this call? No    Completed refill call assessment today to schedule patient's medication shipment from the [facility] [phone]).  All relevant notes have been reviewed.     Specialty medication(s) and dose(s) confirmed: Regimen is correct and unchanged.   Changes to medications: Michelle Travis reports no changes at this time.  Changes to insurance: No  New side effects reported not previously addressed with a pharmacist or physician: None reported  Questions for the pharmacist: No    Confirmed patient received a Conservation officer, historic buildings and a Surveyor, mining with first shipment. The patient will receive a drug information handout for each medication shipped and additional FDA Medication Guides as required.       DISEASE/MEDICATION-SPECIFIC INFORMATION        N/A    SPECIALTY MEDICATION ADHERENCE     Medication Adherence    Patient reported X missed doses in the last month: 0  Specialty Medication: Myfortic 180mg   Patient is on additional specialty medications: No  Adherence tools used: patient uses a pill box to manage medications        Were doses missed due to medication being on hold? No    Myfortic 180 mg: 10 days of medicine on hand     REFERRAL TO PHARMACIST     Referral to the pharmacist: Not needed      [facility]     Shipping address confirmed in Epic.     Delivery Scheduled: Yes, Expected medication delivery date: 06/02/2021.     Medication will be delivered via UPS to the prescription address in Epic WAM.    [name] [facility] Specialty Technician

## 2021-05-30 DIAGNOSIS — Z944 Liver transplant status: Principal | ICD-10-CM

## 2021-06-01 MED FILL — MYFORTIC 180 MG TABLET,DELAYED RELEASE: ORAL | 30 days supply | Qty: 120 | Fill #6

## 2021-06-14 ENCOUNTER — Other Ambulatory Visit: Payer: Self-pay

## 2021-06-14 ENCOUNTER — Encounter: Payer: Self-pay | Admitting: Orthopaedic Surgery

## 2021-06-14 ENCOUNTER — Ambulatory Visit: Payer: Medicare HMO | Admitting: Orthopaedic Surgery

## 2021-06-14 VITALS — BP 156/95 | HR 66 | Ht 63.0 in | Wt 197.4 lb

## 2021-06-14 DIAGNOSIS — M7061 Trochanteric bursitis, right hip: Secondary | ICD-10-CM | POA: Diagnosis not present

## 2021-06-14 NOTE — Patient Instructions (Addendum)
Use Aspercreme, Biofreeze or Voltaren gel over the counter 2-3 times daily make sure you rub it in well each time you use it.   Use ice on the hip several times daily for at least 30 minutes.

## 2021-06-14 NOTE — Progress Notes (Signed)
My right hip hurts.  She has had pain in the right lateral hip area for about a month getting worse.  She called Dr. Legrand Rams and had X-rays of the hip which showed: IMPRESSION: No acute osseous abnormality. Mild degenerative changes of the right hip.   I have independently reviewed and interpreted x-rays of this patient done at another site by another physician or qualified health professional. She has pain laying on the right side of her body and pain walking distances. She has no trauma, no redness, no weakness, no numbness.    She has tried heat, and Tylenol.  She cannot take NSAIDs as she is a transplant patient.  ROM of the right hip is tender but full. She is very tender over the right lateral hip over the trochanteric area.  ROM of back is good.  Muscle strength and tone are normal.  NV intact.  Encounter Diagnosis  Name Primary?   Trochanteric bursitis, right hip Yes   PROCEDURE NOTE:  The patient request injection, verbal consent was obtained.  The right trochanteric area of the hip was prepped appropriately after time out was performed.   Sterile technique was observed and injection of 1 cc of Celestone 6 mg with several cc's of plain xylocaine. Anesthesia was provided by ethyl chloride and a 20-gauge needle was used to inject the hip area. The injection was tolerated well.  A band aid dressing was applied.  The patient was advised to apply ice later today and tomorrow to the injection sight as needed.  I will see her in two weeks.  Use ice.  Call if any problem.  Precautions discussed.  Electronically Signed Sanjuana Kava, MD 8/16/202211:03 AM

## 2021-06-27 DIAGNOSIS — Z79899 Other long term (current) drug therapy: Principal | ICD-10-CM

## 2021-06-27 DIAGNOSIS — Z944 Liver transplant status: Principal | ICD-10-CM

## 2021-06-28 ENCOUNTER — Other Ambulatory Visit: Payer: Self-pay

## 2021-06-28 ENCOUNTER — Encounter: Payer: Self-pay | Admitting: Orthopaedic Surgery

## 2021-06-28 ENCOUNTER — Ambulatory Visit: Payer: Medicare HMO

## 2021-06-28 ENCOUNTER — Ambulatory Visit (INDEPENDENT_AMBULATORY_CARE_PROVIDER_SITE_OTHER): Payer: Medicare HMO | Admitting: Orthopaedic Surgery

## 2021-06-28 VITALS — BP 137/84 | HR 64 | Ht 64.0 in | Wt 196.6 lb

## 2021-06-28 DIAGNOSIS — M545 Low back pain, unspecified: Secondary | ICD-10-CM

## 2021-06-28 DIAGNOSIS — M4316 Spondylolisthesis, lumbar region: Secondary | ICD-10-CM | POA: Diagnosis not present

## 2021-06-28 MED ORDER — PREDNISONE 10 MG (21) PO TBPK: ORAL_TABLET | ORAL | 1 refills | Status: DC

## 2021-06-28 NOTE — Progress Notes (Signed)
My hip is not better.  She has right hip pain and bursitis of the hip and now has some right lower back pain.  She has no trauma.  The injection did not help.  She is in pain management.  She has pain radiating to the knee on the right.  She says she is a little worse.  She cannot take NSAIDs.  Spine/Pelvis examination:  Inspection:  Overall, sacoiliac joint benign and hips nontender; without crepitus or defects.   Thoracic spine inspection: Alignment normal without kyphosis present   Lumbar spine inspection:  Alignment  with normal lumbar lordosis, without scoliosis apparent.   Thoracic spine palpation:  without tenderness of spinal processes   Lumbar spine palpation: without tenderness of lumbar area; without tightness of lumbar muscles    Range of Motion:   Lumbar flexion, forward flexion is normal without pain or tenderness    Lumbar extension is full without pain or tenderness   Left lateral bend is normal without pain or tenderness   Right lateral bend is normal without pain or tenderness   Straight leg raising is normal  Strength & tone: normal   Stability overall normal stability  The right hip is very tender over the trochanteric area. ROM is full.  Encounter Diagnosis  Name Primary?   Lumbar pain Yes   X-rays were done of the lumbar spine, reported separately.  I will begin Prednisone 10 dose pack.  I will set up PT for the back.  She may need MRI. She has spondylolisthesis of L4 on L5.  Continue pain management.  Return in two weeks.  Call if any problem.  Precautions discussed.  Electronically Signed Sanjuana Kava, MD 8/30/20229:34 AM

## 2021-07-01 NOTE — Unmapped (Signed)
[facility] Clinical Assessment & Refill Coordination Note    Michelle Travis, DOB: 01-22-1956  Phone: [phone] (home)     All above HIPAA information was verified with patient.     Was a Nurse, learning disability used for this call? No    Specialty Medication(s):   Transplant: Myfortic 180mg      Current Outpatient Medications   Medication Sig Dispense Refill   ??? aspirin (ECOTRIN) 81 MG tablet Take 1 tablet (81 mg total) by mouth daily. 30 tablet 0   ??? atorvastatin (LIPITOR) 10 MG tablet Take 1 tablet (10 mg total) by mouth daily. 30 tablet 8   ??? BELBUCA 300 mcg buccal film Apply 1 each to cheek daily.     ??? cholecalciferol, vitamin D3, 1,000 unit (25 mcg) tablet Take 1,000 Units by mouth daily.     ??? ENVARSUS XR 1 mg Tb24 extended release tablet Take 2 tablets (2 mg total) by mouth daily. 60 tablet 11   ??? loratadine (CLARITIN) 10 mg tablet Take 10 mg by mouth daily. (Patient not taking: Reported on 04/27/2021)     ??? MYFORTIC 180 mg EC tablet Take 2 tablets (360 mg total) by mouth two (2) times a day. 120 tablet 11   ??? oxyCODONE (ROXICODONE) 10 mg immediate release tablet TAKE 1 (ONE) TABLET BY MOUTH FOUR TIMES DAILY, AS NEEDED     ??? pantoprazole (PROTONIX) 40 MG tablet Take 1 tablet (40 mg total) by mouth daily. 30 tablet 0     No current facility-administered medications for this visit.        Changes to medications: Para reports no changes at this time.    Allergies   Allergen Reactions   ??? Latex Swelling and Rash     Discoloration of skin.   Discoloration of skin.    ??? Hydrocodone Itching   ??? Hydrocodone-Acetaminophen Itching       Changes to allergies: No    SPECIALTY MEDICATION ADHERENCE     Myfortic 180 mg: 6 days of medicine on hand       Medication Adherence    Patient reported X missed doses in the last month: 0  Specialty Medication: Myfortic 180mg   Patient is on additional specialty medications: No  Adherence tools used: patient uses a pill box to manage medications Specialty medication(s) dose(s) confirmed: Regimen is correct and unchanged.     Are there any concerns with adherence? No    Adherence counseling provided? Not needed    CLINICAL MANAGEMENT AND INTERVENTION      Clinical Benefit Assessment:    Do you feel the medicine is effective or helping your condition? Yes    Clinical Benefit counseling provided? Not needed    Adverse Effects Assessment:    Are you experiencing any side effects? No    Are you experiencing difficulty administering your medicine? No    Quality of Life Assessment:         How many days over the past month did your liver/kidney transplant  keep you from your normal activities? For example, brushing your teeth or getting up in the morning. 0    Have you discussed this with your provider? Not needed    Acute Infection Status:    Acute infections noted within Epic:  No active infections  Patient reported infection: None    Therapy Appropriateness:    Is therapy appropriate? Yes, therapy is appropriate and should be continued    DISEASE/MEDICATION-SPECIFIC INFORMATION  N/A    PATIENT SPECIFIC NEEDS     - Does the patient have any physical, cognitive, or cultural barriers? No    - Is the patient high risk? Yes, patient is taking a REMS drug. Medication is dispensed in compliance with REMS program    - Does the patient require a Care Management Plan? No     - Does the patient require physician intervention or other additional services (i.e. nutrition, smoking cessation, social work)? No      SHIPPING     Specialty Medication(s) to be Shipped:   Transplant: Myfortic 180mg     Other medication(s) to be shipped: No additional medications requested for fill at this time     Changes to insurance: No    Delivery Scheduled: Yes, Expected medication delivery date: 07/06/21.     Medication will be delivered via UPS to the confirmed prescription address in [facility].    The patient will receive a drug information handout for each medication shipped and additional FDA Medication Guides as required.  Verified that patient has previously received a Conservation officer, historic buildings and a Surveyor, mining.    The patient or caregiver noted above participated in the development of this care plan and knows that they can request review of or adjustments to the care plan at any time.      All of the patient's questions and concerns have been addressed.    [name]   [facility] Specialty Pharmacist

## 2021-07-04 MED FILL — MYFORTIC 180 MG TABLET,DELAYED RELEASE: ORAL | 30 days supply | Qty: 120 | Fill #7

## 2021-07-06 ENCOUNTER — Ambulatory Visit: Payer: Medicare HMO | Attending: Orthopaedic Surgery | Admitting: Physical Therapy

## 2021-07-06 ENCOUNTER — Encounter: Payer: Self-pay | Admitting: Physical Therapy

## 2021-07-06 ENCOUNTER — Other Ambulatory Visit: Payer: Self-pay

## 2021-07-06 DIAGNOSIS — M545 Low back pain, unspecified: Secondary | ICD-10-CM

## 2021-07-06 DIAGNOSIS — R293 Abnormal posture: Secondary | ICD-10-CM | POA: Insufficient documentation

## 2021-07-06 NOTE — Therapy (Signed)
[facility] [place], [place], [address] Phone: [phone]   Fax:  [phone]  Physical Therapy Evaluation  Patient Details  Name: Savannah Jackson MRN: [id number] Date of Birth: [birth year] Referring Provider (PT): Sanjuana Kava MD   Encounter Date: 07/06/2021   PT End of Session - 07/06/21 1248     Visit Number 1    Number of Visits 12    Date for PT Re-Evaluation 08/24/21    Authorization Type FOTO AT LEAST EVERY 5TH VISIT.  PROGRESS NOTE AT 10TH VISIT.  KX MODIFIER AFTER 15 VISITS.    PT Start Time 0945    PT Stop Time 1027    PT Time Calculation (min) 42 min    Activity Tolerance Patient tolerated treatment well    Behavior During Therapy WFL for tasks assessed/performed             Past Medical History:  Diagnosis Date   Bilateral leg edema    Brain aneurysm    Breast disorder    right breast cancer 2002   Cancer [facility])    breast cancer- lumpectromy, , chemotherapy, radiation   Chronic back pain Diverticultis   Chronic kidney disease (CKD) stage G3b/A3, moderately decreased glomerular filtration rate (GFR) between 30-44 mL/min/1.73 square meter and albuminuria creatinine ratio greater than 300 mg/g (HCC) 08/30/2016   Cirrhosis (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   Pneumonia    Rotator cuff syndrome of left shoulder    Sleep apnea    does not wear CPAP   Steal syndrome as complication of dialysis access ([place])    Thickened endometrium 06/19/2016   Will get endo biopsy    Wears glasses     Past Surgical History:  Procedure Laterality Date   AV FISTULA PLACEMENT Left 05/22/2017    Procedure: BRACHIAL ARTERY TO [place];  Surgeon: Elam Dutch, MD;  Location: [place];  Service: Vascular;  Laterality: Left;   [place] Left 08/07/2017   Procedure: BASILIC VEIN TRANSPOSITION SECOND STAGE;  Surgeon: Elam Dutch, MD;  [facility];  Service: Vascular;  Laterality: Left;   BRAIN SURGERY     aneurysm at [place] Right    cancer- lunmmnpectomy-    CATARACT EXTRACTION Bilateral    [facility] 2 or 3 years ago   CHOLECYSTECTOMY     COLONOSCOPY N/A 04/10/2013   Procedure: COLONOSCOPY;  Surgeon: Rogene Houston, MD;  [facility];  Service: Endoscopy;  Laterality: N/A;  1030-rescheduled to 1200 Ann notified pt   COLONOSCOPY N/A 08/07/2018   Procedure: COLONOSCOPY;  Surgeon: Rogene Houston, MD;  [facility];  Service: Endoscopy;  Laterality: N/A;  930   ESOPHAGOGASTRODUODENOSCOPY N/A 04/22/2015   Procedure: ESOPHAGOGASTRODUODENOSCOPY (EGD);  Surgeon: Rogene Houston, MD;  [facility];  Service: Endoscopy;  Laterality: N/A;   EYE SURGERY     IR GENERIC HISTORICAL  07/04/2016   IR RADIOLOGIST EVAL & MGMT 07/04/2016 Jacqulynn Cadet, MD [facility] INTERV RAD   IR RADIOLOGIST EVAL & MGMT  07/24/2017   KIDNEY SURGERY  KNEE ARTHROSCOPY Left    LIVER TRANSPLANT     POLYPECTOMY  08/07/2018   Procedure: POLYPECTOMY;  Surgeon: Rogene Houston, MD;  [facility];  Service: Endoscopy;;  colon    RADIOLOGY WITH ANESTHESIA N/A 07/16/2015   Procedure: TIPS;  Surgeon: Medication Radiologist, MD;  Location: [place];  Service: Radiology;  Laterality: N/A;   REVISON OF ARTERIOVENOUS FISTULA Left 01/06/2019   Procedure: LEFT ARM LIGATION OF ARTERIOVENOUS FISTULA;  Surgeon: Elam Dutch, MD;  [facility];  Service: Vascular;  Laterality: Left;   ROTATOR CUFF REPAIR Left    TOTAL KNEE ARTHROPLASTY Right    right. 2002    There were no vitals filed for this visit.    Subjective Assessment  - 07/06/21 1252     Subjective COVID-19 screen performed prior to patient entering clinic.  The patient presents to the clinic today with c/o right sided low back pain radiating into her right groin region.This has been ongoing for a couple of months.  She rates her pain at a high, 8/10 today.  Sitting still decreases her pain and walking increases her pain.  She has had ongoing left knee pain, is wearing a barce and states she needs to have this knee replaced.  The pain in her left knee has altered her gait.  She wears a knee brace on that side.    Pertinent History Liver transplant, right TKA, OA, left knee scope, brain surgery, DDD, HTN, left RTC repair, h/o back pain.    How long can you sit comfortably? Unlimited.    How long can you stand comfortably? About 5 minutes.    How long can you walk comfortably? "Not too far" with a straight cane.    Diagnostic tests X-ray.Marland KitchenMarland KitchenPlease see under 'Imaging" tab.    Patient Stated Goals Get out of pain.  Walk better and do more.    Currently in Pain? Yes    Pain Score 8     Pain Location Back   Right groin.   Pain Orientation Right    Pain Descriptors / Indicators Aching;Sharp    Pain Type Acute pain    Pain Onset More than a month ago    Pain Frequency Intermittent    Aggravating Factors  Walking.    Pain Relieving Factors Sitting.                [facility] PT Assessment - 07/06/21 0001       Assessment   Medical Diagnosis Lumbar pain.    Referring Provider (PT) Sanjuana Kava MD    Onset Date/Surgical Date --   ~2 months ago.     Precautions   Precautions None      Restrictions   Weight Bearing Restrictions No      Balance Screen   Has the patient fallen in the past 6 months No    Has the patient had a decrease in activity level because of a fear of falling?  No    Is the patient reluctant to leave their home because of a fear of falling?  No      Home Environment   Living Environment Private residence      Prior Function    Level of Independence Independent      Posture/Postural Control   Posture/Postural Control Postural limitations    Postural Limitations Rounded Shoulders;Forward head;Decreased lumbar lordosis;Flexed trunk    Posture Comments Right knee genu recurvatum.  Left knee in 12 degrees of flexion  in supine.      ROM / Strength   AROM / PROM / Strength AROM;Strength      AROM   Overall AROM Comments Right hip ROM is WF/NL's.  Active lumbar extension to 10 degrees and full active lumbar flexion.  Left knee in 12 degrees of flexion.      Strength   Overall Strength Comments Right hip flexion is 4/5 likely decreased due to pain.  Abduction is normal.  Deferred left knee testing due to pain.  Right and bilateral ankle strength is normal.      Palpation   Palpation comment Very tender to palption over patient's right SIJ region.  She also c/o tenderness in her right lower lumbar region.  Groin pain appears to be referred.      Special Tests   Other special tests No significant pain reproduction with right hip IR/ER.  Right LE is longer due to left knee flexion contracture.      Ambulation/Gait   Gait Comments Antalgic gait pattern with left knee held in flexion and trunk flexed with patient using a straight cane.                        Objective measurements completed on examination: See above findings.       [facility] Adult PT Treatment/Exercise - 07/06/21 0001       Modalities   Modalities Electrical Stimulation;Moist Heat      Moist Heat Therapy   Number Minutes Moist Heat 15 Minutes    Moist Heat Location Lumbar Spine      Electrical Stimulation   Electrical Stimulation Location Right low back.    Electrical Stimulation Action IFC at 80-150 Hz.    Electrical Stimulation Parameters 40% scan x 15 minutes.    Electrical Stimulation Goals Tone;Pain                  Upper Extremity Functional Index Score:  /80        PT Long Term Goals - 07/06/21 1351        PT LONG TERM GOAL #1   Title Independent with a HEP.    Time 6    Period Weeks    Status New      PT LONG TERM GOAL #2   Title Walk a community distance with pain not > 3-4/10.    Time 6    Period Weeks    Status New      PT LONG TERM GOAL #3   Title Stand 20 minutes with pain not > 3/10.    Time 6    Period Weeks    Status New      PT LONG TERM GOAL #4   Title Perform ADL's with pain not > 4/10.    Time 6    Period Weeks    Status New                    Plan - 07/06/21 1307     Clinical Impression Statement The patient presents to OPPT with right-sided low back pain that has been ongoing for about two months.  She c/o of at times severe right sided low back pain referring to her groin.  She is very tender to palption over her right SIJ and painful as well in her right lower lumbar region.  She has a leg length discrepancy due to a left knee flexion contracture.  She does not have significant  pain complaints with right hip IR/ER. Her gait is antalgic in nature and she requires a straight cane for safe ambulation.  Patient will benefit from skilled physical therapy intervention to address pain and deficits.    Personal Factors and Comorbidities Comorbidity 1;Comorbidity 2;Other    Comorbidities Liver transplant, right TKA, OA, left knee scope, brain surgery, DDD, HTN, left RTC repair, h/o back pain.    Examination-Activity Limitations Locomotion Level;Other;Sleep    Examination-Participation Restrictions Other;Yard Work    Biomedical scientist Low    Rehab Potential Good    PT Frequency 2x / week    PT Duration 6 weeks    PT Treatment/Interventions ADLs/Self Care Home Management;Cryotherapy;Electrical Stimulation;Ultrasound;Moist Heat;Functional mobility training;Therapeutic activities;Therapeutic exercise;Manual techniques;Patient/family education;Passive range of motion;Dry needling    PT  Next Visit Plan Combo and STW/M to right low back/SIJ; core exercise progression.    Consulted and Agree with Plan of Care Patient             Patient will benefit from skilled therapeutic intervention in order to improve the following deficits and impairments:  Abnormal gait, Difficulty walking, Decreased activity tolerance, Decreased range of motion, Decreased strength, Postural dysfunction, Increased muscle spasms, Pain  Visit Diagnosis: Acute right-sided low back pain without sciatica - Plan: PT plan of care cert/re-cert  Abnormal posture - Plan: PT plan of care cert/re-cert     Problem List Patient Active Problem List   Diagnosis Date Noted   Encounter for gynecological examination with Papanicolaou smear of cervix 11/13/2019   Screening for colorectal cancer 11/13/2019   History of colonic polyps 05/17/2018   Aftercare following organ transplant 10/17/2017   Kidney transplanted 09/14/2017   Status post liver transplantation ([place]) 09/14/2017   Acute encephalopathy    Hyperammonemia ([place]) 07/15/2017   ESRD on dialysis ([place])    Rectus sheath hematoma 11/22/2016   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   S/P thoracentesis    Acute renal failure superimposed on stage 4 chronic kidney disease ([place])    Hypoalbuminemia 10/29/2016   Leukocytosis 90/30/0923   Diastolic dysfunction with chronic heart failure ([place]) 10/29/2016   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   Chronic kidney disease (CKD) stage G3b/A3, moderately decreased glomerular filtration rate (GFR) between 30-44 mL/min/1.73 square meter and albuminuria creatinine ratio greater than 300 mg/g (HCC) 08/30/2016   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 08/30/2016   Thickened endometrium 06/19/2016   PMB (postmenopausal bleeding) 06/07/2016   History of breast cancer 06/07/2016   Cough    Esophageal varices ([place]) 30/04/6225   Alcoholic cirrhosis of liver without ascites ([place])     Anemia of chronic disease    Peripheral edema 11/26/2015   Anasarca 33/35/4562   Metabolic acidosis 56/38/9373   UTI (lower urinary tract infection) 09/24/2015   Elevated troponin 09/24/2015   Altered mental status 08/30/2015   Hepatic encephalopathy ([place]) 08/30/2015   Acute respiratory failure with hypoxia (HCC)    Recurrent right pleural effusion    Alcoholic cirrhosis of liver with ascites (HCC)    Pleural effusion associated with hepatic disorder 07/03/2015   Chronic kidney disease (CKD), stage IV (severe) ([place]) 07/03/2015   Elevated INR 07/03/2015   Acute respiratory distress 07/03/2015   BRBPR (bright red blood per rectum) 04/21/2015   Pleural effusion, right 04/21/2015   Sleep apnea    Chronic back pain    ETOH abuse    Bilateral leg edema    Cirrhosis, alcoholic ([place]) 42/87/6811  Abdominal pain, left lower quadrant 02/17/2013   Diverticulitis of colon without hemorrhage 02/17/2013   Hypertension     Keanon Bevins, [place], PT 07/06/2021, 1:54 PM  [facility] [address] [place], [place], [address] Phone: [phone]   Fax:  [phone]  Name: CATHYRN DEAS MRN: [id number] Date of Birth: [birth year]

## 2021-07-08 ENCOUNTER — Ambulatory Visit: Payer: Medicare HMO | Admitting: *Deleted

## 2021-07-08 ENCOUNTER — Other Ambulatory Visit: Payer: Self-pay

## 2021-07-08 DIAGNOSIS — R293 Abnormal posture: Secondary | ICD-10-CM

## 2021-07-08 DIAGNOSIS — M545 Low back pain, unspecified: Secondary | ICD-10-CM

## 2021-07-08 NOTE — Therapy (Signed)
[facility] [place], [place], [address] Phone: [phone]   Fax:  [phone]  Physical Therapy Treatment  Patient Details  Name: Savannah Jackson MRN: [id number] Date of Birth: 1956-02-14 Referring Provider (PT): Sanjuana Kava MD   Encounter Date: 07/08/2021   PT End of Session - 07/08/21 1027     Visit Number 2    Number of Visits 12    Date for PT Re-Evaluation 08/24/21    Authorization Type FOTO AT LEAST EVERY 5TH VISIT.  PROGRESS NOTE AT 10TH VISIT.  KX MODIFIER AFTER 15 VISITS.    PT Start Time [phone]    PT Stop Time 1034    PT Time Calculation (min) 47 min    Activity Tolerance Patient tolerated treatment well    Behavior During Therapy WFL for tasks assessed/performed             Past Medical History:  Diagnosis Date   Bilateral leg edema    Brain aneurysm    Breast disorder    right breast cancer 2002   Cancer [facility])    breast cancer- lumpectromy, , chemotherapy, radiation   Chronic back pain Diverticultis   Chronic kidney disease (CKD) stage G3b/A3, moderately decreased glomerular filtration rate (GFR) between 30-44 mL/min/1.73 square meter and albuminuria creatinine ratio greater than 300 mg/g (HCC) 08/30/2016   Cirrhosis ([place])    alcoholic   DDD (degenerative disc disease), lumbar    Diverticulosis    scope 2014   ETOH abuse    Hepatic encephalopathy ([place]) 04/23/2017   Hepatomegaly    scope 2014   History of breast cancer 06/07/2016   Hypertension    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   Pneumonia    Rotator cuff syndrome of left shoulder    Sleep apnea    does not wear CPAP   Steal syndrome as complication of dialysis access ([place])    Thickened endometrium 06/19/2016   Will get endo biopsy    Wears glasses     Past Surgical History:  Procedure Laterality Date   AV FISTULA PLACEMENT Left 05/22/2017    Procedure: BRACHIAL ARTERY TO [place];  Surgeon: Elam Dutch, MD;  Location: [place];  Service: Vascular;  Laterality: Left;   [place] Left 08/07/2017   Procedure: BASILIC VEIN TRANSPOSITION SECOND STAGE;  Surgeon: Elam Dutch, MD;  [facility];  Service: Vascular;  Laterality: Left;   BRAIN SURGERY     aneurysm at [place] Right    cancer- lunmmnpectomy-    CATARACT EXTRACTION Bilateral    [facility] 2 or 3 years ago   CHOLECYSTECTOMY     COLONOSCOPY N/A 04/10/2013   Procedure: COLONOSCOPY;  Surgeon: Rogene Houston, MD;  [facility];  Service: Endoscopy;  Laterality: N/A;  1030-rescheduled to 1200 Ann notified pt   COLONOSCOPY N/A 08/07/2018   Procedure: COLONOSCOPY;  Surgeon: Rogene Houston, MD;  [facility];  Service: Endoscopy;  Laterality: N/A;  930   ESOPHAGOGASTRODUODENOSCOPY N/A 04/22/2015   Procedure: ESOPHAGOGASTRODUODENOSCOPY (EGD);  Surgeon: Rogene Houston, MD;  [facility];  Service: Endoscopy;  Laterality: N/A;   EYE SURGERY     IR GENERIC HISTORICAL  07/04/2016   IR RADIOLOGIST EVAL & MGMT 07/04/2016 Jacqulynn Cadet, MD [facility] INTERV RAD   IR RADIOLOGIST EVAL & MGMT  07/24/2017   KIDNEY SURGERY  KNEE ARTHROSCOPY Left    LIVER TRANSPLANT     POLYPECTOMY  08/07/2018   Procedure: POLYPECTOMY;  Surgeon: Rogene Houston, MD;  [facility];  Service: Endoscopy;;  colon    RADIOLOGY WITH ANESTHESIA N/A 07/16/2015   Procedure: TIPS;  Surgeon: Medication Radiologist, MD;  Location: [place];  Service: Radiology;  Laterality: N/A;   REVISON OF ARTERIOVENOUS FISTULA Left 01/06/2019   Procedure: LEFT ARM LIGATION OF ARTERIOVENOUS FISTULA;  Surgeon: Elam Dutch, MD;  [facility];  Service: Vascular;  Laterality: Left;   ROTATOR CUFF REPAIR Left    TOTAL KNEE ARTHROPLASTY Right    right. 2002    There were no vitals filed for this visit.   Subjective Assessment -  07/08/21 0957     Subjective COVID-19 screen performed prior to patient entering clinic. RT side 8/10 LB/hip today    Pertinent History Liver transplant, right TKA, OA, left knee scope, brain surgery, DDD, HTN, left RTC repair, h/o back pain.    How long can you sit comfortably? Unlimited.    How long can you stand comfortably? About 5 minutes.    How long can you walk comfortably? "Not too far" with a straight cane.    Diagnostic tests X-ray.Marland KitchenMarland KitchenPlease see under 'Imaging" tab.    Patient Stated Goals Get out of pain.  Walk better and do more.    Currently in Pain? Yes    Pain Score 8     Pain Location Back    Pain Orientation Right    Pain Descriptors / Indicators Aching;Sharp    Pain Type Acute pain    Pain Onset More than a month ago                               [facility] Adult PT Treatment/Exercise - 07/08/21 0001       Modalities   Modalities Electrical Stimulation;Ultrasound      Moist Heat Therapy   Number Minutes Moist Heat 15 Minutes    Moist Heat Location Lumbar Spine      Electrical Stimulation   Electrical Stimulation Location Right low back.    Electrical Stimulation Action IFC    Electrical Stimulation Parameters 80-150hz  x 15 mins    Electrical Stimulation Goals Tone;Pain      Ultrasound   Ultrasound Location RT SIJ    Ultrasound Parameters Combo 1.5 w/cm2 x 10 mins    Ultrasound Goals Pain      Manual Therapy   Manual Therapy Soft tissue mobilization    Soft tissue mobilization STW to RT LB and SIJ LT sidelying                          PT Long Term Goals - 07/06/21 1351       PT LONG TERM GOAL #1   Title Independent with a HEP.    Time 6    Period Weeks    Status New      PT LONG TERM GOAL #2   Title Walk a community distance with pain not > 3-4/10.    Time 6    Period Weeks    Status New      PT LONG TERM GOAL #3   Title Stand 20 minutes with pain not > 3/10.    Time 6    Period Weeks    Status New  PT LONG TERM GOAL #4   Title Perform ADL's with pain not > 4/10.    Time 6    Period Weeks    Status New                   Plan - 07/08/21 1238     Clinical Impression Statement Pt arrived today doing fair, but still with 8/10 LB/hip pain RT side. Rx focused on RT SIJ/LB area with [place] combo and STW performed and tolerate well. Pt was very sore along RT SIJ and some referred pain to RT hip during STW. All performed with Pt in LT sidelying position. To MD Tuesday. Decreased pain end of session with improved gait pattern    Personal Factors and Comorbidities Comorbidity 1;Comorbidity 2;Other    Comorbidities Liver transplant, right TKA, OA, left knee scope, brain surgery, DDD, HTN, left RTC repair, h/o back pain.    Stability/Clinical Decision Making Evolving/Moderate complexity    PT Frequency 2x / week    PT Duration 6 weeks    PT Treatment/Interventions ADLs/Self Care Home Management;Cryotherapy;Electrical Stimulation;Ultrasound;Moist Heat;Functional mobility training;Therapeutic activities;Therapeutic exercise;Manual techniques;Patient/family education;Passive range of motion;Dry needling    PT Next Visit Plan Combo and STW/M to right low back/SIJ; core exercise progression.             Patient will benefit from skilled therapeutic intervention in order to improve the following deficits and impairments:  Abnormal gait, Difficulty walking, Decreased activity tolerance, Decreased range of motion, Decreased strength, Postural dysfunction, Increased muscle spasms, Pain  Visit Diagnosis: Acute right-sided low back pain without sciatica  Abnormal posture     Problem List Patient Active Problem List   Diagnosis Date Noted   Encounter for gynecological examination with Papanicolaou smear of cervix 11/13/2019   Screening for colorectal cancer 11/13/2019   History of colonic polyps 05/17/2018   Aftercare following organ transplant 10/17/2017   Kidney transplanted 09/14/2017    Status post liver transplantation ([place]) 09/14/2017   Acute encephalopathy    Hyperammonemia ([place]) 07/15/2017   ESRD on dialysis ([place])    Rectus sheath hematoma 11/22/2016   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   S/P thoracentesis    Acute renal failure superimposed on stage 4 chronic kidney disease ([place])    Hypoalbuminemia 10/29/2016   Leukocytosis 38/45/3646   Diastolic dysfunction with chronic heart failure ([place]) 10/29/2016   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   Chronic kidney disease (CKD) stage G3b/A3, moderately decreased glomerular filtration rate (GFR) between 30-44 mL/min/1.73 square meter and albuminuria creatinine ratio greater than 300 mg/g (HCC) 08/30/2016   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 08/30/2016   Thickened endometrium 06/19/2016   PMB (postmenopausal bleeding) 06/07/2016   History of breast cancer 06/07/2016   Cough    Esophageal varices ([place]) 80/32/1224   Alcoholic cirrhosis of liver without ascites ([place])    Anemia of chronic disease    Peripheral edema 11/26/2015   Anasarca 82/50/0370   Metabolic acidosis 48/88/9169   UTI (lower urinary tract infection) 09/24/2015   Elevated troponin 09/24/2015   Altered mental status 08/30/2015   Hepatic encephalopathy ([place]) 08/30/2015   Acute respiratory failure with hypoxia (HCC)    Recurrent right pleural effusion    Alcoholic cirrhosis of liver with ascites (HCC)    Pleural effusion associated with hepatic disorder 07/03/2015   Chronic kidney disease (CKD), stage IV (severe) ([place]) 07/03/2015   Elevated INR 07/03/2015   Acute respiratory distress 07/03/2015   BRBPR (bright red blood per rectum) 04/21/2015  Pleural effusion, right 04/21/2015   Sleep apnea    Chronic back pain    ETOH abuse    Bilateral leg edema    Cirrhosis, alcoholic ([place]) 25/85/2778   Abdominal pain, left lower quadrant 02/17/2013   Diverticulitis of colon without hemorrhage 02/17/2013    Hypertension     Lisaann Atha,CHRIS, PTA 07/08/2021, 12:47 PM  [facility] [place], [place], [address] Phone: [phone]   Fax:  [phone]  Name: Savannah Jackson MRN: [id number] Date of Birth: Nov 26, 1955

## 2021-07-11 DIAGNOSIS — N042 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: Principal | ICD-10-CM

## 2021-07-12 ENCOUNTER — Encounter: Payer: Self-pay | Admitting: Orthopaedic Surgery

## 2021-07-12 ENCOUNTER — Ambulatory Visit (INDEPENDENT_AMBULATORY_CARE_PROVIDER_SITE_OTHER): Payer: Medicare HMO | Admitting: Orthopaedic Surgery

## 2021-07-12 ENCOUNTER — Other Ambulatory Visit: Payer: Self-pay

## 2021-07-12 VITALS — BP 167/104 | HR 68 | Ht 64.0 in | Wt 200.6 lb

## 2021-07-12 DIAGNOSIS — M7061 Trochanteric bursitis, right hip: Secondary | ICD-10-CM

## 2021-07-12 DIAGNOSIS — M545 Low back pain, unspecified: Secondary | ICD-10-CM

## 2021-07-12 NOTE — Unmapped (Signed)
Patient wanted labs to be ordered prior to visit with Dr Margaretmary Bayley.    Orders placed

## 2021-07-12 NOTE — Progress Notes (Signed)
I still hurt.  Her lower back and her right hip are tender still.  She has been to PT twice.  I have reviewed the notes.  She has more pain in the lower back and the lateral hip on the right.  She has no new trauma.  The PT did not help that much.  She is taking her medicine.  She says she is worse.  The right hip has more tenderness laterally over the trochanteric area. ROM is good. There is no redness.  Spine/Pelvis examination:  Inspection:  Overall, sacoiliac joint benign and hips nontender; without crepitus or defects.   Thoracic spine inspection: Alignment normal without kyphosis present   Lumbar spine inspection:  Alignment  with normal lumbar lordosis, without scoliosis apparent.   Thoracic spine palpation:  without tenderness of spinal processes   Lumbar spine palpation: without tenderness of lumbar area; without tightness of lumbar muscles    Range of Motion:   Lumbar flexion, forward flexion is normal without pain or tenderness    Lumbar extension is full without pain or tenderness   Left lateral bend is normal without pain or tenderness   Right lateral bend is normal without pain or tenderness   Straight leg raising is normal  Strength & tone: normal   Stability overall normal stability  Encounter Diagnoses  Name Primary?   Lumbar pain Yes   Trochanteric bursitis, right hip    PROCEDURE NOTE:  The patient request injection, verbal consent was obtained.  The right trochanteric area of the hip was prepped appropriately after time out was performed.   Sterile technique was observed and injection of 1 cc of Celestone 6 mg with several cc's of plain xylocaine. Anesthesia was provided by ethyl chloride and a 20-gauge needle was used to inject the hip area. The injection was tolerated well.  A band aid dressing was applied.  The patient was advised to apply ice later today and tomorrow to the injection sight as needed.  I will get MRI of the lumbar spine as she is not  improving.  Continue the PT.  Return in three weeks.  Call if any problem.  Precautions discussed.  Electronically Signed Sanjuana Kava, MD 9/13/20229:50 AM

## 2021-07-13 LAB — COMPREHENSIVE METABOLIC PANEL
A/G RATIO: 1.4 (ref 1.2–2.2)
ALBUMIN: 3.9 g/dL (ref 3.8–4.8)
ALKALINE PHOSPHATASE: 73 IU/L (ref 44–121)
ALT (SGPT): 17 IU/L (ref 0–32)
AST (SGOT): 19 IU/L (ref 0–40)
BILIRUBIN TOTAL: 0.3 mg/dL (ref 0.0–1.2)
BLOOD UREA NITROGEN: 11 mg/dL (ref 8–27)
BUN / CREAT RATIO: 18 (ref 12–28)
CALCIUM: 9.4 mg/dL (ref 8.7–10.3)
CHLORIDE: 104 mmol/L (ref 96–106)
CO2: 25 mmol/L (ref 20–29)
CREATININE: 0.6 mg/dL (ref 0.57–1.00)
EGFR: 100 mL/min/{1.73_m2}
GLOBULIN, TOTAL: 2.7 g/dL (ref 1.5–4.5)
GLUCOSE: 108 mg/dL — ABNORMAL HIGH (ref 65–99)
POTASSIUM: 4.9 mmol/L (ref 3.5–5.2)
SODIUM: 142 mmol/L (ref 134–144)
TOTAL PROTEIN: 6.6 g/dL (ref 6.0–8.5)

## 2021-07-13 LAB — GAMMA GT: GAMMA GLUTAMYL TRANSFERASE: 58 IU/L (ref 0–60)

## 2021-07-13 LAB — BILIRUBIN, DIRECT: BILIRUBIN DIRECT: <0.1 mg/dL (ref 0.00–0.40)

## 2021-07-13 LAB — MAGNESIUM: MAGNESIUM: 1.5 mg/dL — ABNORMAL LOW (ref 1.6–2.3)

## 2021-07-13 LAB — PHOSPHORUS: PHOSPHORUS, SERUM: 3.4 mg/dL (ref 3.0–4.3)

## 2021-07-14 LAB — TACROLIMUS LEVEL: TACROLIMUS BLOOD: 3.6 ng/mL (ref 2.0–20.0)

## 2021-07-14 NOTE — Unmapped (Signed)
Called pt to check in, reviewed 9/13 stable cmp & tac level. Pt reported that she is seeing an ortho provider for hip pain. She had a steroid injection on 9/13, but pain has not quite resolved. She has a follow up appt with Dr. Margaretmary Bayley on 9/21, will inform this TNC of recommendations.

## 2021-07-18 ENCOUNTER — Encounter: Payer: Self-pay | Admitting: Physical Therapy

## 2021-07-18 ENCOUNTER — Other Ambulatory Visit: Payer: Self-pay

## 2021-07-18 ENCOUNTER — Ambulatory Visit: Payer: Medicare HMO | Admitting: Physical Therapy

## 2021-07-18 DIAGNOSIS — R293 Abnormal posture: Secondary | ICD-10-CM

## 2021-07-18 DIAGNOSIS — M545 Low back pain, unspecified: Secondary | ICD-10-CM

## 2021-07-18 NOTE — Therapy (Signed)
[name], [place], [address] Phone: [phone]   Fax:  [phone]  Physical Therapy Treatment  Patient Details  Name: Savannah Jackson MRN: [id number] Date of Birth: 1956-09-14 Referring Provider (PT): Sanjuana Kava MD   Encounter Date: 07/18/2021   PT End of Session - 07/18/21 1346     Visit Number 3    Number of Visits 12    Date for PT Re-Evaluation 08/24/21    Authorization Type FOTO AT LEAST EVERY 5TH VISIT.  PROGRESS NOTE AT 10TH VISIT.  KX MODIFIER AFTER 15 VISITS.    PT Start Time 1308    PT Stop Time 1345    PT Time Calculation (min) 37 min    Activity Tolerance Patient tolerated treatment well    Behavior During Therapy WFL for tasks assessed/performed             Past Medical History:  Diagnosis Date   Bilateral leg edema    Brain aneurysm    Breast disorder    right breast cancer 2002   Cancer [facility])    breast cancer- lumpectromy, , chemotherapy, radiation   Chronic back pain Diverticultis   Chronic kidney disease (CKD) stage G3b/A3, moderately decreased glomerular filtration rate (GFR) between 30-44 mL/min/1.73 square meter and albuminuria creatinine ratio greater than 300 mg/g (HCC) 08/30/2016   Cirrhosis ([place])    alcoholic   DDD (degenerative disc disease), lumbar    Diverticulosis    scope 2014   ETOH abuse    Hepatic encephalopathy ([place]) 04/23/2017   Hepatomegaly    scope 2014   History of breast cancer 06/07/2016   Hypertension    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   Pneumonia    Rotator cuff syndrome of left shoulder    Sleep apnea    does not wear CPAP   Steal syndrome as complication of dialysis access ([place])    Thickened endometrium 06/19/2016   Will get endo biopsy    Wears glasses     Past Surgical History:  Procedure Laterality Date   AV FISTULA PLACEMENT Left 05/22/2017    Procedure: BRACHIAL ARTERY TO [place];  Surgeon: Elam Dutch, MD;  Location: [place];  Service: Vascular;  Laterality: Left;   [place] Left 08/07/2017   Procedure: BASILIC VEIN TRANSPOSITION SECOND STAGE;  Surgeon: Elam Dutch, MD;  [facility];  Service: Vascular;  Laterality: Left;   BRAIN SURGERY     aneurysm at [place] Right    cancer- lunmmnpectomy-    CATARACT EXTRACTION Bilateral    [facility] 2 or 3 years ago   CHOLECYSTECTOMY     COLONOSCOPY N/A 04/10/2013   Procedure: COLONOSCOPY;  Surgeon: Rogene Houston, MD;  [facility];  Service: Endoscopy;  Laterality: N/A;  1030-rescheduled to 1200 Ann notified pt   COLONOSCOPY N/A 08/07/2018   Procedure: COLONOSCOPY;  Surgeon: Rogene Houston, MD;  [facility];  Service: Endoscopy;  Laterality: N/A;  930   ESOPHAGOGASTRODUODENOSCOPY N/A 04/22/2015   Procedure: ESOPHAGOGASTRODUODENOSCOPY (EGD);  Surgeon: Rogene Houston, MD;  [facility];  Service: Endoscopy;  Laterality: N/A;   EYE SURGERY     IR GENERIC HISTORICAL  07/04/2016   IR RADIOLOGIST EVAL & MGMT 07/04/2016 Jacqulynn Cadet, MD [facility] INTERV RAD   IR RADIOLOGIST EVAL & MGMT  07/24/2017   KIDNEY SURGERY  KNEE ARTHROSCOPY Left    LIVER TRANSPLANT     POLYPECTOMY  08/07/2018   Procedure: POLYPECTOMY;  Surgeon: Rogene Houston, MD;  [facility];  Service: Endoscopy;;  colon    RADIOLOGY WITH ANESTHESIA N/A 07/16/2015   Procedure: TIPS;  Surgeon: Medication Radiologist, MD;  Location: [place];  Service: Radiology;  Laterality: N/A;   REVISON OF ARTERIOVENOUS FISTULA Left 01/06/2019   Procedure: LEFT ARM LIGATION OF ARTERIOVENOUS FISTULA;  Surgeon: Elam Dutch, MD;  [facility];  Service: Vascular;  Laterality: Left;   ROTATOR CUFF REPAIR Left    TOTAL KNEE ARTHROPLASTY Right    right. 2002    There were no vitals filed for this visit.   Subjective Assessment -  07/18/21 1303     Subjective COVID-19 screen performed prior to patient entering clinic. Seen her MD and states that she will be having an MRI as the MD states that she needs a hip replacement. LBP is still present in R low back. had another injection on 07/12/2021    Pertinent History Liver transplant, right TKA, OA, left knee scope, brain surgery, DDD, HTN, left RTC repair, h/o back pain.    How long can you sit comfortably? Unlimited.    How long can you stand comfortably? About 5 minutes.    How long can you walk comfortably? "Not too far" with a straight cane.    Diagnostic tests X-ray.Marland KitchenMarland KitchenPlease see under 'Imaging" tab.    Patient Stated Goals Get out of pain.  Walk better and do more.    Currently in Pain? Yes    Pain Score --   no rating provided   Pain Location Back    Pain Orientation Right;Lower    Pain Descriptors / Indicators Discomfort    Pain Type Acute pain    Pain Onset More than a month ago    Pain Frequency Intermittent                [facility] PT Assessment - 07/18/21 0001       Assessment   Medical Diagnosis Lumbar pain.    Referring Provider (PT) Sanjuana Kava MD      Precautions   Precautions None      Restrictions   Weight Bearing Restrictions No                           [facility] Adult PT Treatment/Exercise - 07/18/21 0001       Modalities   Modalities Electrical Stimulation;Ultrasound      Acupuncturist Location R QL region    Electrical Stimulation Action Pre-Mod    Electrical Stimulation Parameters 80-150 hz x10 min    Electrical Stimulation Goals Tone;Pain      Ultrasound   Ultrasound Location R low back/ SI joint    Ultrasound Parameters Combo 1.5 w/cm2, 100%, 1 mhz x10 min    Ultrasound Goals Pain      Manual Therapy   Manual Therapy Soft tissue mobilization    Soft tissue mobilization STW to R lumbar paraspinals, QL to reduce tone and pain                          PT  Long Term Goals - 07/06/21 1351       PT LONG TERM GOAL #1   Title Independent with a HEP.    Time 6  Period Weeks    Status New      PT LONG TERM GOAL #2   Title Walk a community distance with pain not > 3-4/10.    Time 6    Period Weeks    Status New      PT LONG TERM GOAL #3   Title Stand 20 minutes with pain not > 3/10.    Time 6    Period Weeks    Status New      PT LONG TERM GOAL #4   Title Perform ADL's with pain not > 4/10.    Time 6    Period Weeks    Status New                   Plan - 07/18/21 1422     Clinical Impression Statement Patient presented in clinic with reports of increased pain in R low back region. Patient was told recently by orthopedist that she needed a R THR as well as L TKR. Patient uses [facility] for ambulation and has antalgic gait deviations notable due to pain. Patient reported tenderness during conservative treatment of R LBP along R QL region with moderate muscle tightness palpable. Normal modalities response noted following removal of the modalities.    Personal Factors and Comorbidities Comorbidity 1;Comorbidity 2;Other    Comorbidities Liver transplant, right TKA, OA, left knee scope, brain surgery, DDD, HTN, left RTC repair, h/o back pain.    Examination-Activity Limitations Locomotion Level;Other;Sleep    Examination-Participation Restrictions Other;Yard Work    [employer]chant [employer] officer Evolving/Moderate complexity    Rehab Potential Good    PT Frequency 2x / week    PT Duration 6 weeks    PT Treatment/Interventions ADLs/Self Care Home Management;Cryotherapy;Electrical Stimulation;Ultrasound;Moist Heat;Functional mobility training;Therapeutic activities;Therapeutic exercise;Manual techniques;Patient/family education;Passive range of motion;Dry needling    PT Next Visit Plan Combo and STW/M to right low back/SIJ; core exercise progression.    Consulted and Agree with Plan of Care Patient             Patient will  benefit from skilled therapeutic intervention in order to improve the following deficits and impairments:  Abnormal gait, Difficulty walking, Decreased activity tolerance, Decreased range of motion, Decreased strength, Postural dysfunction, Increased muscle spasms, Pain  Visit Diagnosis: Acute right-sided low back pain without sciatica  Abnormal posture     Problem List Patient Active Problem List   Diagnosis Date Noted   Encounter for gynecological examination with Papanicolaou smear of cervix 11/13/2019   Screening for colorectal cancer 11/13/2019   History of colonic polyps 05/17/2018   Aftercare following organ transplant 10/17/2017   Kidney transplanted 09/14/2017   Status post liver transplantation ([place]) 09/14/2017   Acute encephalopathy    Hyperammonemia ([place]) 07/15/2017   ESRD on dialysis ([place])    Rectus sheath hematoma 11/22/2016   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   S/P thoracentesis    Acute renal failure superimposed on stage 4 chronic kidney disease ([place])    Hypoalbuminemia 10/29/2016   Leukocytosis 28/31/5176   Diastolic dysfunction with chronic heart failure ([place]) 10/29/2016   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   Chronic kidney disease (CKD) stage G3b/A3, moderately decreased glomerular filtration rate (GFR) between 30-44 mL/min/1.73 square meter and albuminuria creatinine ratio greater than 300 mg/g (HCC) 08/30/2016   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 08/30/2016   Thickened endometrium 06/19/2016   PMB (postmenopausal bleeding) 06/07/2016   History of breast cancer 06/07/2016   Cough    Esophageal varices (  [place]) 90/93/1121   Alcoholic cirrhosis of liver without ascites ([place])    Anemia of chronic disease    Peripheral edema 11/26/2015   Anasarca 62/44/6950   Metabolic acidosis 72/25/7505   UTI (lower urinary tract infection) 09/24/2015   Elevated troponin 09/24/2015   Altered mental status 08/30/2015    Hepatic encephalopathy ([place]) 08/30/2015   Acute respiratory failure with hypoxia (HCC)    Recurrent right pleural effusion    Alcoholic cirrhosis of liver with ascites (HCC)    Pleural effusion associated with hepatic disorder 07/03/2015   Chronic kidney disease (CKD), stage IV (severe) ([place]) 07/03/2015   Elevated INR 07/03/2015   Acute respiratory distress 07/03/2015   BRBPR (bright red blood per rectum) 04/21/2015   Pleural effusion, right 04/21/2015   Sleep apnea    Chronic back pain    ETOH abuse    Bilateral leg edema    Cirrhosis, alcoholic ([place]) 18/33/5825   Abdominal pain, left lower quadrant 02/17/2013   Diverticulitis of colon without hemorrhage 02/17/2013   Hypertension     Standley Brooking, PTA 07/18/2021, 2:27 PM  [facility] [place], [place], [address] Phone: [phone]   Fax:  [phone]  Name: MAESYN FRISINGER MRN: [id number] Date of Birth: 12-23-1955

## 2021-07-20 ENCOUNTER — Ambulatory Visit: Admit: 2021-07-20 | Discharge: 2021-07-20 | Payer: MEDICARE

## 2021-07-20 ENCOUNTER — Ambulatory Visit: Admit: 2021-07-20 | Discharge: 2021-07-20 | Payer: MEDICARE | Attending: Nephrology | Primary: Nephrology

## 2021-07-20 DIAGNOSIS — Z940 Kidney transplant status: Principal | ICD-10-CM

## 2021-07-20 DIAGNOSIS — Z79899 Other long term (current) drug therapy: Principal | ICD-10-CM

## 2021-07-20 LAB — COMPREHENSIVE METABOLIC PANEL
ALBUMIN: 3.7 g/dL (ref 3.4–5.0)
ALKALINE PHOSPHATASE: 74 U/L (ref 46–116)
ALT (SGPT): 24 U/L (ref 10–49)
ANION GAP: 5 mmol/L (ref 5–14)
AST (SGOT): 25 U/L (ref <=34)
BILIRUBIN TOTAL: 0.7 mg/dL (ref 0.3–1.2)
BLOOD UREA NITROGEN: 11 mg/dL (ref 9–23)
BUN / CREAT RATIO: 18
CALCIUM: 9.6 mg/dL (ref 8.7–10.4)
CHLORIDE: 102 mmol/L (ref 98–107)
CO2: 31.7 mmol/L — ABNORMAL HIGH (ref 20.0–31.0)
CREATININE: 0.61 mg/dL
EGFR CKD-EPI (2021) FEMALE: >90 mL/min/{1.73_m2} (ref >=60)
GLUCOSE RANDOM: 96 mg/dL (ref 70–179)
POTASSIUM: 4.3 mmol/L (ref 3.4–4.8)
PROTEIN TOTAL: 7 g/dL (ref 5.7–8.2)
SODIUM: 139 mmol/L (ref 135–145)

## 2021-07-20 LAB — PROTEIN / CREATININE RATIO, URINE
CREATININE, URINE: 44.8 mg/dL
PROTEIN URINE: 27.9 mg/dL
PROTEIN/CREAT RATIO, URINE: 0.623

## 2021-07-20 LAB — URINALYSIS
BILIRUBIN UA: NEGATIVE
GLUCOSE UA: NEGATIVE
KETONES UA: NEGATIVE
LEUKOCYTE ESTERASE UA: NEGATIVE
NITRITE UA: NEGATIVE
PH UA: 6 (ref 5.0–9.0)
RBC UA: 2 /HPF (ref <4)
RENAL TUBULAR EPITHELIAL CELLS: 1 /HPF — ABNORMAL HIGH (ref <=0)
SPECIFIC GRAVITY UA: 1.015 (ref 1.005–1.030)
SQUAMOUS EPITHELIAL: 4 /HPF (ref 0–5)
UROBILINOGEN UA: 0.2
WBC UA: 2 /HPF (ref 0–5)

## 2021-07-20 LAB — CBC W/ AUTO DIFF
BASOPHILS ABSOLUTE COUNT: 0 10*9/L (ref 0.0–0.1)
BASOPHILS RELATIVE PERCENT: 1.1 %
EOSINOPHILS ABSOLUTE COUNT: 0.2 10*9/L (ref 0.0–0.5)
EOSINOPHILS RELATIVE PERCENT: 5.1 %
HEMATOCRIT: 40.4 % (ref 34.0–44.0)
HEMOGLOBIN: 13 g/dL (ref 11.3–14.9)
LYMPHOCYTES ABSOLUTE COUNT: 1.3 10*9/L (ref 1.1–3.6)
LYMPHOCYTES RELATIVE PERCENT: 32.4 %
MEAN CORPUSCULAR HEMOGLOBIN CONC: 32.3 g/dL (ref 32.0–36.0)
MEAN CORPUSCULAR HEMOGLOBIN: 26.8 pg (ref 25.9–32.4)
MEAN CORPUSCULAR VOLUME: 83.1 fL (ref 77.6–95.7)
MEAN PLATELET VOLUME: 8.3 fL (ref 6.8–10.7)
MONOCYTES ABSOLUTE COUNT: 0.8 10*9/L (ref 0.3–0.8)
MONOCYTES RELATIVE PERCENT: 19.8 %
NEUTROPHILS ABSOLUTE COUNT: 1.7 10*9/L — ABNORMAL LOW (ref 1.8–7.8)
NEUTROPHILS RELATIVE PERCENT: 41.6 %
NUCLEATED RED BLOOD CELLS: 0 /100{WBCs} (ref <=4)
PLATELET COUNT: 384 10*9/L (ref 150–450)
RED BLOOD CELL COUNT: 4.87 10*12/L (ref 3.95–5.13)
RED CELL DISTRIBUTION WIDTH: 15.4 % — ABNORMAL HIGH (ref 12.2–15.2)
WBC ADJUSTED: 4 10*9/L (ref 3.6–11.2)

## 2021-07-20 LAB — ALBUMIN / CREATININE URINE RATIO
ALBUMIN QUANT URINE: 11.4 mg/dL
ALBUMIN/CREATININE RATIO: 254.5 ug/mg — ABNORMAL HIGH (ref 0.0–30.0)
CREATININE, URINE: 44.8 mg/dL

## 2021-07-20 LAB — MAGNESIUM: MAGNESIUM: 1.5 mg/dL — ABNORMAL LOW (ref 1.6–2.6)

## 2021-07-20 LAB — PARATHYROID HORMONE (PTH): PARATHYROID HORMONE INTACT: 104.7 pg/mL — ABNORMAL HIGH (ref 18.4–80.1)

## 2021-07-20 LAB — PHOSPHORUS: PHOSPHORUS: 3.3 mg/dL (ref 2.4–5.1)

## 2021-07-20 NOTE — Unmapped (Signed)
Per provider Prevnar-20, Flu vaccine administered. Pt tolerated injections well.

## 2021-07-20 NOTE — Unmapped (Signed)
Transplant Coordinator, Clinic Visit   Pt seen today by transplant nephrologist  for follow up, reviewed medications and symptoms.     Assessment  BP:  123/83  BG: 96    Constitutional: Negative for fever, chills, night sweats, unintentional weight loss, loss of appetite.  HEAD: Negative for headache or dizziness  Eyes: Negative for visual changes.  ENT: Negative for sore throat.  Cardiovascular: Negative for chest pain, pressure, or palpitations  Respiratory: Negative for shortness of breath.  Gastrointestinal: Negative for abdominal pain, nausea, vomiting, diarrhea or constipation.  Genitourinary: Negative for dysuria, polyuria,  hematuria, urgency, hesitancy, or ordor  Musculoskeletal: Negative for unilateral leg swelling  Skin/Hair: Negative for rash, itching, or hair loss  Neurological: Negative for numbness, tingling or hand tremors.  Pain: 0/10  Sleeping: 6-8 hours  Eating:  3 meals + snacks  Envarsus last taken  Before labs today    Immunizations administered  Flu vaccine   Pneumovax 20

## 2021-07-20 NOTE — Unmapped (Signed)
Transplant Nephrology Clinic Visit    Assessment and Plan  Michelle Travis is a 65 y.o. 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 She developed recurrent PLA2R positive membranous nephropathy and is seen for follow up of her transplant and associated medical problems. Issues addressed today include:    Status post kidney transplant with membranous nephropathy recurrence.  - Kidney biopsy 03/11/21 with PLA2R positive membranous nephropathy  - Treatment included Rituximab 1 gm on 03/23/21 and 1 gm on 04/07/21 and 125 mg solumedrol x 2 given with the doses of Rituximab.   - UPC prior to the biopsy was 2.3 and peaked at 5.02 on 03/23/21. UPC has been gradually improving and today is down to 0.623.    - Serum creatinine has remained stable with value today of 0.61 mg/dL (baseline < 0.8 mg/dL)    S/P Liver transplant with excellent liver function.   - LFTs normal today  - She will continue to follow with the liver transplant team.     Immunosuppression.   - Tacrolimus level is 6.3 (target 3-6).   - Patient will continue Envarsus 2 mg daily and Myfortic 360 mg BID.     History of IgG lambda monoclonal gammopathy.   - SPEP with immunofixation 03/19/20 revealed no monoclonal protein. Kidney biopsy revealed no evidence of MGRS.    History of sleep apnea.   - She is no longer on CPAP.  - She denies daytime drowsiness.     Bilateral knee pain. Patient was reassured that if TKR is necessary her transplant status does not represent a contraindication to surgery.     Immunizations.  -  Prevnar-20 today 07/20/21.  - Flu vaccine today 07/20/21   - Shingrix x 2 completed  - COVID-19 vaccine x 4 completed. A bivalent booster dose is now due and will be done locally. She is aware of the limited efficacy of the vaccine.     Cancer screening.   - Mammography 05/25/21.  - PAP smear last year was unremarkable.  - Colonoscopy 08/07/18 with single 6 mm polyp and diverticulosis.     Follow-up.   Patient will return to clinic in 4 months.   Monthly labs including UPC.    History of Present Illness  Michelle Travis is a 65 y.o. 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  She was found to have proteinuria and kidney biopsy 03/11/21 revealed recurrent membranous nephropathy. She received 2 doses of Rituximab paired with solumedrol 125 mg with subsequent improvement in proteinuria.       She presents today with primary complaint of hip pain. She is receiving PT and underwent injections in orthopedics clinic. She is otherwise without complaints. She denies chest pain, edema, shortness of breath, cough, fever, chills, headache, tremors, nausea, vomiting, diarrhea, allograft tenderness.       Transplant History:  1. 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 S/P kidney and liver transplants on 09/13/17 at [facility]. Kidney KDPI 26%. CMV D+/R+; EBV D-/R+, hep C Ab +/NAT- (recipient hep C Ab negative)  4. Complicated by rectus sheath hematoma  5. Delayed graft function requiring hemodialysis sessions following transplant (stopped dialysis 11/26/018).  6. Immunosuppression was ??? induction followed by tacrolimus and myfortic maintenance therapy.   7. Baseline creatinine <1.0 mg/dL.  8. Kidney biopsy 03/11/21 with recurrent PLA2R  positive membranous nephropathy, treated with Rituximab 1 gm x 2 (03/23/21, 04/07/21).     Past Medical History:  1. Alcoholic cirrhosis, S/P TIPS  2. Membranous nephropathy, PLA2R positive biopsy 08/30/2016, treated solumedrol initially with little improvement then with Rituximab (10/2016 and 03/2017).   3. S/P kidney and liver transplants as stated above  4. History of breast cancer, in remission for 17 years, s/p lumpectomy.  5. History of IGG monoclonal gammopathy, IgG lambda. Kidney biopsy 08/30/2016 without MGRS.  6. S/p cerebral aneurysm repair.  7. S/P total knee replacement, right   8. Essential HTN prior to ESLD  9. History of diverticulitis  10. Recurrent pleural effusion, history of pneumonia x 3  11. Osteopenia by DEXA 06/14/2016  12. Hypoattenuating thyroid nodule noted on chest CT 11/11/2016  13. Sleep apnea    Review of Systems    All other systems are reviewed and are negative.    Medications    Current Outpatient Medications   Medication Sig Dispense Refill   ??? aspirin (ECOTRIN) 81 MG tablet Take 1 tablet (81 mg total) by mouth daily. 30 tablet 0   ??? atorvastatin (LIPITOR) 10 MG tablet Take 1 tablet (10 mg total) by mouth daily. 30 tablet 8   ??? BELBUCA 300 mcg buccal film Apply 1 each to cheek daily.     ??? cholecalciferol, vitamin D3, 1,000 unit (25 mcg) tablet Take 1,000 Units by mouth daily.     ??? ENVARSUS XR 1 mg Tb24 extended release tablet Take 2 tablets (2 mg total) by mouth daily. 60 tablet 11   ??? MYFORTIC 180 mg EC tablet Take 2 tablets (360 mg total) by mouth two (2) times a day. 120 tablet 11   ??? oxyCODONE (ROXICODONE) 10 mg immediate release tablet TAKE 1 (ONE) TABLET BY MOUTH FOUR TIMES DAILY, AS NEEDED     ??? pantoprazole (PROTONIX) 40 MG tablet Take 1 tablet (40 mg total) by mouth daily. 30 tablet 0   ??? loratadine (CLARITIN) 10 mg tablet Take 10 mg by mouth daily. (Patient not taking: No sig reported)       No current facility-administered medications for this visit.       Physical Exam    BP 123/83 (BP Site: R Arm, BP Position: Sitting, BP Cuff Size: Medium)  - Pulse 70  - Temp 36 ??C (96.8 ??F) (Temporal)  - Ht 162.6 cm (5' 4)  - Wt 90.2 kg (198 lb 12.8 oz)  - LMP  (LMP Unknown)  - BMI 34.12 kg/m??   General: Patient is a pleasant female in no apparent distress.  Eyes: Sclera anicteric.  Neck: Supple without LAD/JVD/bruits.  Lungs: Clear to auscultation bilaterally, no wheezes/rales/rhonchi.  Cardiovascular: Regular rate and rhythm without murmurs, rubs or gallops.  Abdomen: Soft, notender/nondistended. Positive bowel sounds. No hepatosplenomegaly, masses or bruits appreciated.   Extremities: Without edema, joints without evidence of synovitis  Skin: Without rash  Neurological: Grossly nonfocal.  Psychiatric: Mood and affect appropriate.    Laboratory Results and Imaging Reviewed

## 2021-07-21 LAB — CMV DNA, QUANTITATIVE, PCR: CMV VIRAL LD: NOT DETECTED

## 2021-07-21 LAB — VITAMIN D 25 HYDROXY: VITAMIN D, TOTAL (25OH): 26.5 ng/mL (ref 20.0–80.0)

## 2021-07-21 LAB — TACROLIMUS LEVEL, TROUGH: TACROLIMUS, TROUGH: 6.3 ng/mL (ref 5.0–15.0)

## 2021-07-22 ENCOUNTER — Encounter: Payer: Medicare HMO | Admitting: Physical Therapy

## 2021-07-22 LAB — BK VIRUS QUANTITATIVE PCR, BLOOD: BK BLOOD RESULT: NOT DETECTED

## 2021-07-22 NOTE — Unmapped (Signed)
Called pt to review 9/21 stable labs, Tac within goal (3-6 per Dr. Margaretmary Bayley). Pt plans to schedule an appt with ortho for knee/hip issues. Will notify this TNC if she decides to pursue surgical intervention. Pt very appreciative of call, will repeat labs in 3 mos.

## 2021-07-25 DIAGNOSIS — Z79899 Other long term (current) drug therapy: Principal | ICD-10-CM

## 2021-07-25 DIAGNOSIS — Z944 Liver transplant status: Principal | ICD-10-CM

## 2021-07-25 NOTE — Unmapped (Signed)
[facility] Refill Coordination Note    Specialty Medication(s) to be Shipped:   Transplant: Myfortic 180mg     Other medication(s) to be shipped: No additional medications requested for fill at this time     Michelle Travis, DOB: 09/20/1956  Phone: [phone] (home)       All above HIPAA information was verified with patient.     Was a Nurse, learning disability used for this call? No    Completed refill call assessment today to schedule patient's medication shipment from the [facility] [phone]).  All relevant notes have been reviewed.     Specialty medication(s) and dose(s) confirmed: Regimen is correct and unchanged.   Changes to medications: Michelle Travis reports no changes at this time.  Changes to insurance: No  New side effects reported not previously addressed with a pharmacist or physician: None reported  Questions for the pharmacist: No    Confirmed patient received a Conservation officer, historic buildings and a Surveyor, mining with first shipment. The patient will receive a drug information handout for each medication shipped and additional FDA Medication Guides as required.       DISEASE/MEDICATION-SPECIFIC INFORMATION        N/A    SPECIALTY MEDICATION ADHERENCE     Medication Adherence    Patient reported X missed doses in the last month: 0  Specialty Medication: MYFORTIC 180 mg EC tablet  Patient is on additional specialty medications: No  Adherence tools used: patient uses a pill box to manage medications             myfortic 180mg : 10 days worth of medication on hand.      Were doses missed due to medication being on hold? No        REFERRAL TO PHARMACIST     Referral to the pharmacist: Not needed      [facility]     Shipping address confirmed in Epic.     Delivery Scheduled: Yes, Expected medication delivery date: 08/02/21.     Medication will be delivered via UPS to the prescription address in Epic WAM.    Michelle Travis   [facility] Specialty Technician

## 2021-07-29 ENCOUNTER — Other Ambulatory Visit: Payer: Self-pay

## 2021-07-29 ENCOUNTER — Telehealth: Payer: Self-pay

## 2021-07-29 ENCOUNTER — Encounter (HOSPITAL_COMMUNITY): Payer: Self-pay

## 2021-07-29 ENCOUNTER — Ambulatory Visit (HOSPITAL_COMMUNITY)
Admission: RE | Admit: 2021-07-29 | Discharge: 2021-07-29 | Disposition: A | Discharge status: Home / Self Care | Payer: Medicare HMO | Source: Ambulatory Visit | Attending: Orthopaedic Surgery | Admitting: Orthopaedic Surgery

## 2021-07-29 DIAGNOSIS — M545 Low back pain, unspecified: Secondary | ICD-10-CM

## 2021-07-29 NOTE — Telephone Encounter (Signed)
Ivin Booty from the MRI dept called left message stating that patient has a brain aneurysm clip unsafe per radiologist. Pt has no documentation on clip. She canceled appointment and sent patient home.

## 2021-08-01 MED FILL — MYFORTIC 180 MG TABLET,DELAYED RELEASE: ORAL | 30 days supply | Qty: 120 | Fill #8

## 2021-08-02 ENCOUNTER — Other Ambulatory Visit: Payer: Self-pay

## 2021-08-02 ENCOUNTER — Encounter: Payer: Self-pay | Admitting: Orthopaedic Surgery

## 2021-08-02 ENCOUNTER — Ambulatory Visit (INDEPENDENT_AMBULATORY_CARE_PROVIDER_SITE_OTHER): Payer: Medicare HMO | Admitting: Orthopaedic Surgery

## 2021-08-02 ENCOUNTER — Other Ambulatory Visit: Payer: Self-pay | Admitting: Orthopaedic Surgery

## 2021-08-02 VITALS — BP 125/9 | HR 63 | Ht 64.0 in | Wt 202.2 lb

## 2021-08-02 DIAGNOSIS — M545 Low back pain, unspecified: Secondary | ICD-10-CM | POA: Diagnosis not present

## 2021-08-02 LAB — HLA DS POST TRANSPLANT
ANTI-DONOR HLA-A #1 MFI: 0 MFI
ANTI-DONOR HLA-A #2 MFI: 0 MFI
ANTI-DONOR HLA-B #1 MFI: 0 MFI
ANTI-DONOR HLA-B #2 MFI: 0 MFI
ANTI-DONOR HLA-C #1 MFI: 0 MFI
ANTI-DONOR HLA-C #2 MFI: 0 MFI
ANTI-DONOR HLA-DP #2 MFI: 43 MFI
ANTI-DONOR HLA-DQB #1 MFI: 0 MFI
ANTI-DONOR HLA-DQB #2 MFI: 34 MFI
ANTI-DONOR HLA-DR #1 MFI: 0 MFI
ANTI-DONOR HLA-DR #2 MFI: 0 MFI

## 2021-08-02 LAB — FSAB CLASS 2 ANTIBODY SPECIFICITY
CLASS 2 ANTIBODIES IDENTIFIED: 1:6 {titer}
HLA CL2 AB RESULT: POSITIVE

## 2021-08-02 LAB — FSAB CLASS 1 ANTIBODY SPECIFICITY: HLA CLASS 1 ANTIBODY RESULT: NEGATIVE

## 2021-08-02 NOTE — Progress Notes (Signed)
I still hurt  She could not get MRI of the lumbar spine as she has clips in her brain from aneurysm surgery years ago.  She did not tell me of this and xray realized it and let me know.  I will get CT scan of the lumbar spine.  She might need myelogram depending on findings.  She was seen at [facility] for other medical problems this week and given a good report.  She has not been able to go to PT secondary to scheduling problem with her schedule.  Spine/Pelvis examination:  Inspection:  Overall, sacoiliac joint benign and hips nontender; without crepitus or defects.   Thoracic spine inspection: Alignment normal without kyphosis present   Lumbar spine inspection:  Alignment  with normal lumbar lordosis, without scoliosis apparent.   Thoracic spine palpation:  without tenderness of spinal processes   Lumbar spine palpation: without tenderness of lumbar area; without tightness of lumbar muscles    Range of Motion:   Lumbar flexion, forward flexion is normal without pain or tenderness    Lumbar extension is full without pain or tenderness   Left lateral bend is normal without pain or tenderness   Right lateral bend is normal without pain or tenderness   Straight leg raising is normal  Strength & tone: normal   Stability overall normal stability  Encounter Diagnosis  Name Primary?   Lumbar pain Yes   Get CT scan  Return in one week.  Call if any problem.  Precautions discussed.  Electronically Signed Sanjuana Kava, MD 10/4/20228:36 AM

## 2021-08-04 ENCOUNTER — Other Ambulatory Visit: Payer: Self-pay

## 2021-08-04 ENCOUNTER — Ambulatory Visit (HOSPITAL_COMMUNITY)
Admission: RE | Admit: 2021-08-04 | Discharge: 2021-08-04 | Disposition: A | Discharge status: Home / Self Care | Payer: Medicare HMO | Source: Ambulatory Visit | Attending: Orthopaedic Surgery | Admitting: Orthopaedic Surgery

## 2021-08-04 DIAGNOSIS — M545 Low back pain, unspecified: Secondary | ICD-10-CM | POA: Insufficient documentation

## 2021-08-09 ENCOUNTER — Ambulatory Visit (INDEPENDENT_AMBULATORY_CARE_PROVIDER_SITE_OTHER): Payer: Medicare HMO | Admitting: Orthopaedic Surgery

## 2021-08-09 ENCOUNTER — Other Ambulatory Visit: Payer: Self-pay

## 2021-08-09 ENCOUNTER — Encounter: Payer: Self-pay | Admitting: Orthopaedic Surgery

## 2021-08-09 ENCOUNTER — Other Ambulatory Visit: Payer: Self-pay | Admitting: Orthopaedic Surgery

## 2021-08-09 VITALS — BP 140/87 | HR 84

## 2021-08-09 DIAGNOSIS — M545 Low back pain, unspecified: Secondary | ICD-10-CM

## 2021-08-09 DIAGNOSIS — Z940 Kidney transplant status: Secondary | ICD-10-CM

## 2021-08-09 NOTE — Progress Notes (Signed)
My back still hurts.  She had CT scan of the lumbar spine showing: IMPRESSION: 1. No acute osseous abnormality in the lumbar spine. But grade 1 anterolisthesis at L4-L5 has developed since 2016, and is associated with severe facet arthropathy. Combined with disc bulging there is moderate spinal stenosis, up to severe lateral recess stenosis, and up to moderate right foraminal stenosis. Query Right L4 and/or L5 radiculitis. 2. Widespread lumbar facet arthropathy elsewhere which can also be a source of back pain. Mild multifactorial spinal stenosis at L3-L4. 3. Native renal atrophy with left pelvic transplant kidney, new since 2016.  I have explained the findings.  I will have Savannah Jackson see Dr. Ernestina Patches to see if an epidural will help.  She agrees.  I have independently reviewed and interpreted x-rays of this patient done at another site by another physician or qualified health professional.   Savannah Jackson back pain continues.  She is on a cane.  She has pain in the right lower back with right sided sciatica but no weakness.  Encounter Diagnoses  Name Primary?   Lumbar pain Yes   Renal transplant, status post    Return in one month.  See Dr. Ernestina Patches.  Call if any problem.  Precautions discussed.  Electronically Signed Sanjuana Kava, MD 10/11/20228:51 AM

## 2021-08-11 ENCOUNTER — Telehealth: Payer: Self-pay | Admitting: Physical Medicine and Rehabilitation

## 2021-08-11 NOTE — Telephone Encounter (Signed)
Pt called requesting to reschedule appt for earlier. Please call pt at [phone].

## 2021-08-12 NOTE — Telephone Encounter (Signed)
Left message #1

## 2021-08-17 ENCOUNTER — Ambulatory Visit: Payer: Self-pay

## 2021-08-17 ENCOUNTER — Other Ambulatory Visit: Payer: Self-pay

## 2021-08-17 ENCOUNTER — Encounter: Payer: Self-pay | Admitting: Physical Medicine and Rehabilitation

## 2021-08-17 ENCOUNTER — Ambulatory Visit (INDEPENDENT_AMBULATORY_CARE_PROVIDER_SITE_OTHER): Payer: Medicare HMO | Admitting: Physical Medicine and Rehabilitation

## 2021-08-17 VITALS — BP 124/83 | HR 79

## 2021-08-17 DIAGNOSIS — M1611 Unilateral primary osteoarthritis, right hip: Secondary | ICD-10-CM

## 2021-08-17 DIAGNOSIS — M25551 Pain in right hip: Secondary | ICD-10-CM | POA: Diagnosis not present

## 2021-08-17 DIAGNOSIS — R1031 Right lower quadrant pain: Secondary | ICD-10-CM

## 2021-08-17 DIAGNOSIS — M5416 Radiculopathy, lumbar region: Secondary | ICD-10-CM | POA: Diagnosis not present

## 2021-08-17 DIAGNOSIS — M4316 Spondylolisthesis, lumbar region: Secondary | ICD-10-CM

## 2021-08-17 DIAGNOSIS — M48062 Spinal stenosis, lumbar region with neurogenic claudication: Secondary | ICD-10-CM

## 2021-08-17 NOTE — Progress Notes (Signed)
Pt state lower back pain that travels to her right hip and around to hergroin area. Pt state walking, standing and laying down makes the pain worse. Pt state she uses heating pad, bio freeze and pain meds to help ease her pain. Pt state she goes to a pain clinic.  Numeric Pain Rating Scale and Functional Assessment Average Pain 10 Pain Right Now 10 My pain is constant, sharp, dull, stabbing, and aching Pain is worse with: walking, bending, sitting, standing, some activites, and Laying down Pain improves with: heat/ice and medication   In the last MONTH (on 0-10 scale) has pain interfered with the following?  1. General activity like being  able to carry out your everyday physical activities such as walking, climbing stairs, carrying groceries, or moving a chair?  Rating(8)  2. Relation with others like being able to carry out your usual social activities and roles such as  activities at home, at work and in your community. Rating(9)  3. Enjoyment of life such that you have  been bothered by emotional problems such as feeling anxious, depressed or irritable?  Rating(9)

## 2021-08-17 NOTE — Progress Notes (Signed)
Savannah Jackson - 65 y.o. female MRN [id number]  Date of birth: June 19, 1956  Office Visit Note: Visit Date: 08/17/2021 PCP: Rosita Fire, MD Referred by: Rosita Fire, MD  Subjective: Chief Complaint  Patient presents with   Lower Back - Pain   Right Hip - Pain   HPI: Savannah Jackson is a 65 y.o. female who comes in today per the request of Dr. Sanjuana Kava for evaluation of right-sided groin pain radiating around to buttock.  Patient reports pain has been ongoing for several months.  She initially saw Dr. Luna Glasgow in August with more complaints of hip and lateral hip pain.  She attended a couple sessions of physical therapy.  At that time it and felt that this was more spine related than radicular pain.  Per the notes from Dr. Luna Glasgow she did not endorse any groin pain or pain with movement at that time.  Her case is complicated by kidney transplant as well as chronic pain management.  She sees Kellie Shropshire MD at [facility] on [address]  She takes Belbuca as well as oxycodone for breakthrough pain.  She reports the current pain is exacerbated by walking, getting in and out of the car or movement of the leg and prolonged standing, describes as soreness and sharpness sensations, currently rates as 8 out of 10.  Patient states some relief of pain with rest and the use of topical anti-inflammatory medications.  Patient did receive 2 right trochanteric bursa injections recently from Dr. Sanjuana Kava which she reports did not help to alleviate pain.  He reported in his notes good range of motion of the hip.  Since she was having hip and groin pain today worse with movement of the leg we did obtain x-rays of the pelvis and both hips.  Patient's right hip x-rays obtained today exhibit mild to moderate degenerative changes on the right and mild degenerative changes on the left.  No evidence of acute fractures. Patient did recently attend formal physical therapy at [facility] which she reports did not help to alleviate her pain.  Patient does ambulate with a cane at this time.  Patient denies focal weakness, numbness and tingling.  Patient denies recent trauma or falls.  At this time patient is unable to have MRI imaging due to previously placed brain aneurysm clips.  Dr. Luna Glasgow did obtain CT scan of the lumbar spine with concern for stenosis.  The images were reviewed with the patient today and reviewed below.  Patient's course is complicated by diastolic heart failure, sleep apnea, chronic kidney disease, kidney transplant and liver transplant.  Patient continues to have regular follow-ups with the transplant team at [facility] and reports no recurrent issues at this time.  Review of Systems  Musculoskeletal:  Positive for joint pain.  Neurological:  Negative for tingling, sensory change, focal weakness and weakness.  All other systems reviewed and are negative. Otherwise per HPI.  Assessment & Plan: Visit Diagnoses:    ICD-10-CM   1. Pain in right hip  M25.551 XR HIPS BILAT W OR W/O PELVIS 2V    XR C-ARM NO REPORT    2. Unilateral primary osteoarthritis, right hip  M16.11     3. Groin pain, right  R10.31     4. Lumbar radiculopathy  M54.16     5. Spinal stenosis of lumbar region with neurogenic claudication  M48.062     6. Spondylolisthesis of lumbar region  M43.16  Plan: Findings:  Chronic, worsening and severe right-sided groin pain rating around to the buttock.  Patient reports pain has been ongoing for several years but had recently worsened over the last several months..  She continues to have excruciating pain despite good conservative therapies such as formal physical therapy, rest and medications.  Clinically her symptoms today seem related to the hip itself in terms of severity.  She likely does have some component of neurogenic claudication from the stenosis.  We feel the next step is to perform a diagnostic and hopefully  therapeutic right intra-articular hip injection, which we did complete today.  She did get relief during the anesthetic phase of the injection.  We will follow-up with patient to reassess her pain after this injection.  If the hip turns out to be most of the issue I will have her return back to see Dr. Luna Glasgow for further management of the hip.  If the hip injection relieves part of the pain but she still having some symptoms of radicular pain would consider epidural injection and we would also consider regrouping with physical therapy if patient continues to have pain or right hip injection if short-lived.  Patient encouraged to continue being active as tolerated and to use cane to assist with ambulation to prevent falls.  No red flag symptoms noted upon exam today.   Meds & Orders: No orders of the defined types were placed in this encounter.   Orders Placed This Encounter  Procedures   Large Joint Inj   XR HIPS BILAT W OR W/O PELVIS 2V   XR C-ARM NO REPORT    Follow-up: Return if symptoms worsen or fail to improve.   Procedures: Large Joint Inj: R hip joint on 08/17/2021 2:00 PM Indications: diagnostic evaluation and pain Details: 22 G 3.5 in needle, fluoroscopy-guided anterior approach  Arthrogram: No  Medications: 4 mL bupivacaine 0.25 %; 60 mg triamcinolone acetonide 40 MG/ML Aspirate: 2 mL serous Outcome: tolerated well, no immediate complications  There was excellent flow of contrast producing a partial arthrogram of the hip. The patient did have relief of symptoms during the anesthetic phase of the injection. Procedure, treatment alternatives, risks and benefits explained, specific risks discussed. Consent was given by the patient. Immediately prior to procedure a time out was called to verify the correct patient, procedure, equipment, support staff and site/side marked as required. Patient was prepped and draped in the usual sterile fashion.         Clinical History: EXAM: CT  LUMBAR SPINE WITHOUT CONTRAST   TECHNIQUE: Multidetector CT imaging of the lumbar spine was performed without intravenous contrast administration. Multiplanar CT image reconstructions were also generated.   COMPARISON:  Lumbar CT myelogram 05/16/2007. CT Abdomen and Pelvis 04/21/2015.   FINDINGS: Segmentation: Normal.   Alignment: Grade 1 anterolisthesis has developed at L4-L5 since 2016, measuring 4 mm. There is mild levoconvex lumbar scoliosis, increased since 2008.   Vertebrae: No acute osseous abnormality identified. Intact visible sacrum and SI joints.   Paraspinal and other soft tissues: Pronounced bilateral renal atrophy since 2016. And there is a left pelvic renal transplant kidney now (series 4, image 79). Surgical clips about the liver, and in the gallbladder fossa where the gallbladder is surgically absent. Other visible noncontrast abdominal viscera appear negative.   Lumbar paraspinal soft tissues are within normal limits.   Disc levels:   T12-L1:  Mild facet hypertrophy.  No stenosis.   L1-L2:  Negative.   L2-L3: Subtle circumferential disc bulge.  Mild to moderate facet hypertrophy. Trace vacuum phenomena on the right. No significant stenosis.   L3-L4: Mild circumferential disc bulge. Moderate facet and mild to moderate ligament flavum hypertrophy. Epidural lipomatosis. Mild spinal stenosis. Mild left greater than right L3 neural foraminal stenosis.   L4-L5: Anterolisthesis. Bulky circumferential disc/pseudo disc and severe bilateral facet hypertrophy. Moderate ligament flavum hypertrophy.   Moderate spinal stenosis. Moderate to severe lateral recess stenosis is possible (L5 nerve levels). There is mild to moderate right L4 foraminal stenosis.   L5-S1: Moderate to severe facet degeneration, vacuum phenomena on the right. Broad-based central disc bulge or protrusion. No convincing stenosis at this level.   IMPRESSION: 1. No acute osseous  abnormality in the lumbar spine. But grade 1 anterolisthesis at L4-L5 has developed since 2016, and is associated with severe facet arthropathy. Combined with disc bulging there is moderate spinal stenosis, up to severe lateral recess stenosis, and up to moderate right foraminal stenosis. Query Right L4 and/or L5 radiculitis. 2. Widespread lumbar facet arthropathy elsewhere which can also be a source of back pain. Mild multifactorial spinal stenosis at L3-L4. 3. Native renal atrophy with left pelvic transplant kidney, new since 2016.     Electronically Signed   By: Genevie Ann M.D.   On: 08/08/2021 04:31   She reports that she has never smoked. She has never used smokeless tobacco. No results for input(s): HGBA1C, LABURIC in the last 8760 hours.  Objective:  VS:  HT:    WT:   BMI:     BP:124/83  HR:79bpm  TEMP: ( )  RESP:  Physical Exam Vitals and nursing note reviewed.  HENT:     Head: Normocephalic and atraumatic.     Right Ear: External ear normal.     Left Ear: External ear normal.     Nose: Nose normal.     Mouth/Throat:     Mouth: Mucous membranes are moist.  Eyes:     Extraocular Movements: Extraocular movements intact.  Cardiovascular:     Rate and Rhythm: Normal rate.     Pulses: Normal pulses.  Pulmonary:     Effort: Pulmonary effort is normal.  Abdominal:     General: Abdomen is flat. There is no distension.  Musculoskeletal:        General: Tenderness present.     Cervical back: Normal range of motion.     Comments: Pt has significant limitations in mobility. Pain upon palpation of right greater trochanter. Pain noted upon rotation of right hip. Patient uses can to assist with ambulation.   Skin:    General: Skin is warm and dry.     Capillary Refill: Capillary refill takes less than 2 seconds.  Neurological:     Mental Status: She is alert.     Gait: Gait abnormal.  Psychiatric:        Mood and Affect: Mood normal.    Ortho Exam  Imaging: No results  found.  Past Medical/Family/Surgical/Social History: Medications & Allergies reviewed per EMR, new medications updated. 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   S/P thoracentesis    Acute renal failure superimposed on stage 4 chronic kidney disease (HCC)    Hypoalbuminemia 10/29/2016   Leukocytosis 53/61/4431   Diastolic dysfunction with chronic heart failure ([place]) 10/29/2016   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   Chronic kidney disease (CKD) stage G3b/A3, moderately decreased glomerular filtration rate (GFR) between 30-44 mL/min/1.73 square meter and albuminuria creatinine ratio greater than 300 mg/g (HCC) 08/30/2016   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 08/30/2016   Thickened endometrium 06/19/2016   PMB (postmenopausal bleeding) 06/07/2016   History of breast cancer 06/07/2016   Cough    Esophageal varices ([place]) 54/00/8676   Alcoholic cirrhosis of liver without ascites ([place])    Anemia of chronic disease    Peripheral edema 11/26/2015   Anasarca 19/50/9326   Metabolic acidosis 71/24/5809   UTI (lower urinary tract infection) 09/24/2015   Elevated troponin 09/24/2015   Altered mental status 08/30/2015   Hepatic encephalopathy 08/30/2015   Acute respiratory failure with hypoxia (HCC)    Recurrent right pleural effusion    Alcoholic cirrhosis of liver with ascites (HCC)    Pleural effusion associated with hepatic disorder 07/03/2015   Chronic kidney disease  (CKD), stage IV (severe) ([place]) 07/03/2015   Elevated INR 07/03/2015   Acute respiratory distress 07/03/2015   BRBPR (bright red blood per rectum) 04/21/2015   Pleural effusion, right 04/21/2015   Sleep apnea    Chronic back pain    ETOH abuse    Bilateral leg edema    Cirrhosis, alcoholic ([place]) 98/33/8250   Abdominal pain, left lower quadrant 02/17/2013   Diverticulitis of colon without hemorrhage 02/17/2013   Hypertension    Past Medical History:  Diagnosis Date   Bilateral leg edema    Brain aneurysm    Breast disorder    right breast cancer 2002   Cancer [facility])    breast cancer- lumpectromy, , chemotherapy, radiation   Chronic back pain Diverticultis   Chronic kidney disease (CKD) stage G3b/A3, moderately decreased glomerular filtration rate (GFR) between 30-44 mL/min/1.73 square meter and albuminuria creatinine ratio greater than 300 mg/g ([place]) 08/30/2016   Cirrhosis ([place])    alcoholic   DDD (degenerative disc disease), lumbar    Diverticulosis    scope 2014   ETOH abuse    Hepatic encephalopathy 04/23/2017   Hepatomegaly    scope 2014   History of breast cancer 06/07/2016   Hypertension    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   Pneumonia    Rotator cuff syndrome of left shoulder    Sleep apnea    does not wear CPAP   Steal syndrome as complication of dialysis access ([place])    Thickened endometrium 06/19/2016   Will get endo biopsy    Wears glasses    Family History  Problem Relation Age of Onset   Aneurysm Mother    Other Daughter        knee replacement   Hypertension Daughter    Past Surgical History:  Procedure Laterality Date   AV FISTULA PLACEMENT Left 05/22/2017   Procedure: BRACHIAL ARTERY TO BASCILIC VEIN  FISTULA CREATION;  Surgeon: Elam Dutch, MD;  Location: [place];  Service: Vascular;  Laterality: Left;   [place] Left 08/07/2017    Procedure: BASILIC VEIN TRANSPOSITION SECOND STAGE;  Surgeon: Elam Dutch, MD;  Location: [place];  Service: Vascular;  Laterality: Left;   BRAIN SURGERY  aneurysm at [place] Right    cancer- lunmmnpectomy-    CATARACT EXTRACTION Bilateral    [facility] 2 or 3 years ago   CHOLECYSTECTOMY     COLONOSCOPY N/A 04/10/2013   Procedure: COLONOSCOPY;  Surgeon: Rogene Houston, MD;  [facility];  Service: Endoscopy;  Laterality: N/A;  1030-rescheduled to 1200 Ann notified pt   COLONOSCOPY N/A 08/07/2018   Procedure: COLONOSCOPY;  Surgeon: Rogene Houston, MD;  [facility];  Service: Endoscopy;  Laterality: N/A;  930   ESOPHAGOGASTRODUODENOSCOPY N/A 04/22/2015   Procedure: ESOPHAGOGASTRODUODENOSCOPY (EGD);  Surgeon: Rogene Houston, MD;  [facility];  Service: Endoscopy;  Laterality: N/A;   EYE SURGERY     IR GENERIC HISTORICAL  07/04/2016   IR RADIOLOGIST EVAL & MGMT 07/04/2016 Jacqulynn Cadet, MD [facility] INTERV RAD   IR RADIOLOGIST EVAL & MGMT  07/24/2017   KIDNEY SURGERY     KNEE ARTHROSCOPY Left    LIVER TRANSPLANT     POLYPECTOMY  08/07/2018   Procedure: POLYPECTOMY;  Surgeon: Rogene Houston, MD;  [facility];  Service: Endoscopy;;  colon    RADIOLOGY WITH ANESTHESIA N/A 07/16/2015   Procedure: TIPS;  Surgeon: Medication Radiologist, MD;  Location: [place];  Service: Radiology;  Laterality: N/A;   REVISON OF ARTERIOVENOUS FISTULA Left 01/06/2019   Procedure: LEFT ARM LIGATION OF ARTERIOVENOUS FISTULA;  Surgeon: Elam Dutch, MD;  [facility];  Service: Vascular;  Laterality: Left;   ROTATOR CUFF REPAIR Left    TOTAL KNEE ARTHROPLASTY Right    right. 2002   Social History   Occupational History   Occupation: disabled  Tobacco Use   Smoking status: Never   Smokeless tobacco: Never  Vaping Use   Vaping Use: Never used  Substance and Sexual Activity   Alcohol use: No    Alcohol/week: 0.0 standard drinks    Comment:  none since 03/2015   Drug use: No   Sexual activity: Not Currently    Birth control/protection: Post-menopausal

## 2021-08-19 MED ORDER — BUPIVACAINE HCL 0.25 % IJ SOLN
4.0000 mL | INTRAMUSCULAR | Status: AC | PRN
  Administered 2021-08-17: 4 mL via INTRA_ARTICULAR

## 2021-08-19 MED ORDER — TRIAMCINOLONE ACETONIDE 40 MG/ML IJ SUSP
60.0000 mg | INTRAMUSCULAR | Status: AC | PRN
Start: 2021-08-17 — End: 2021-08-17
  Administered 2021-08-17: 60 mg via INTRA_ARTICULAR

## 2021-08-22 ENCOUNTER — Telehealth: Payer: Self-pay | Admitting: Physical Medicine and Rehabilitation

## 2021-08-22 DIAGNOSIS — Z944 Liver transplant status: Principal | ICD-10-CM

## 2021-08-22 DIAGNOSIS — Z796 Long-term use of immunosuppressant medication: Principal | ICD-10-CM

## 2021-08-22 NOTE — Unmapped (Signed)
[facility] Refill Coordination Note    Specialty Medication(s) to be Shipped:   Transplant: Myfortic 180mg     Other medication(s) to be shipped: No additional medications requested for fill at this time     Michelle Travis, DOB: 23-Aug-1956  Phone: [phone] (home)       All above HIPAA information was verified with patient.     Was a Nurse, learning disability used for this call? No    Completed refill call assessment today to schedule patient's medication shipment from the [facility] [phone]).  All relevant notes have been reviewed.     Specialty medication(s) and dose(s) confirmed: Regimen is correct and unchanged.   Changes to medications: Zitlaly reports no changes at this time.  Changes to insurance: No  New side effects reported not previously addressed with a pharmacist or physician: None reported  Questions for the pharmacist: No    Confirmed patient received a Conservation officer, historic buildings and a Surveyor, mining with first shipment. The patient will receive a drug information handout for each medication shipped and additional FDA Medication Guides as required.       DISEASE/MEDICATION-SPECIFIC INFORMATION        N/A    SPECIALTY MEDICATION ADHERENCE     Medication Adherence    Patient reported X missed doses in the last month: 0  Specialty Medication: Myfortic 180mg   Patient is on additional specialty medications: No  Adherence tools used: patient uses a pill box to manage medications        Were doses missed due to medication being on hold? No    Myfortic 180 mg: 10 days of medicine on hand     REFERRAL TO PHARMACIST     Referral to the pharmacist: Not needed      [facility]     Shipping address confirmed in Epic.     Delivery Scheduled: Yes, Expected medication delivery date: 08/31/2021.     Medication will be delivered via UPS to the prescription address in Epic WAM.    [name] Specialty Technician

## 2021-08-22 NOTE — Telephone Encounter (Signed)
Pt called requesting a call back about update of her injection she received on Friday. Please call pt at [phone].

## 2021-08-30 MED FILL — MYFORTIC 180 MG TABLET,DELAYED RELEASE: ORAL | 30 days supply | Qty: 120 | Fill #9

## 2021-09-06 ENCOUNTER — Telehealth: Payer: Self-pay | Admitting: Physical Medicine and Rehabilitation

## 2021-09-06 ENCOUNTER — Ambulatory Visit (INDEPENDENT_AMBULATORY_CARE_PROVIDER_SITE_OTHER): Payer: Medicare HMO | Admitting: Orthopaedic Surgery

## 2021-09-06 ENCOUNTER — Encounter: Payer: Self-pay | Admitting: Orthopaedic Surgery

## 2021-09-06 ENCOUNTER — Other Ambulatory Visit: Payer: Self-pay

## 2021-09-06 VITALS — BP 156/93 | HR 64 | Ht 64.0 in | Wt 201.4 lb

## 2021-09-06 DIAGNOSIS — M545 Low back pain, unspecified: Secondary | ICD-10-CM

## 2021-09-06 DIAGNOSIS — Z940 Kidney transplant status: Secondary | ICD-10-CM | POA: Diagnosis not present

## 2021-09-06 NOTE — Telephone Encounter (Signed)
Pt called wanting to get another appt for a back inj. She would like a CB asap, she states she's in a lot of pain.   [phone]

## 2021-09-06 NOTE — Progress Notes (Signed)
I got that shot.  She had the epidural from Dr. Ernestina Patches.  It worked well until just a few days ago and she started having her back pain again.  She has no new trauma.  She gets pain medicine from the pain clinic.  She is using a cane.  She has no weakness.  Spine/Pelvis examination:  Inspection:  Overall, sacoiliac joint benign and hips nontender; without crepitus or defects.   Thoracic spine inspection: Alignment normal without kyphosis present   Lumbar spine inspection:  Alignment  with normal lumbar lordosis, without scoliosis apparent.   Thoracic spine palpation:  without tenderness of spinal processes   Lumbar spine palpation: without tenderness of lumbar area; without tightness of lumbar muscles    Range of Motion:   Lumbar flexion, forward flexion is normal without pain or tenderness    Lumbar extension is full without pain or tenderness   Left lateral bend is normal without pain or tenderness   Right lateral bend is normal without pain or tenderness   Straight leg raising is normal  Strength & tone: normal   Stability overall normal stability  Encounter Diagnoses  Name Primary?   Lumbar pain Yes   Renal transplant, status post    I have asked her to call Dr. Romona Curls office and see if she can get another injection in the next few weeks.  Return in two months.  Call if any problem.  Precautions discussed.  Electronically Signed Sanjuana Kava, MD 11/8/20228:42 AM

## 2021-09-07 ENCOUNTER — Telehealth: Payer: Self-pay | Admitting: Physical Medicine and Rehabilitation

## 2021-09-07 NOTE — Telephone Encounter (Signed)
Patient returned call and asked for a call back. The number to contact patient is [phone]

## 2021-09-08 ENCOUNTER — Ambulatory Visit: Payer: Medicare HMO | Admitting: Orthopaedic Surgery

## 2021-09-08 ENCOUNTER — Ambulatory Visit (INDEPENDENT_AMBULATORY_CARE_PROVIDER_SITE_OTHER): Payer: Medicare HMO | Admitting: Orthopaedic Surgery

## 2021-09-08 ENCOUNTER — Other Ambulatory Visit (HOSPITAL_COMMUNITY): Payer: Self-pay

## 2021-09-08 ENCOUNTER — Ambulatory Visit: Payer: Medicare HMO

## 2021-09-08 ENCOUNTER — Encounter: Payer: Self-pay | Admitting: Orthopaedic Surgery

## 2021-09-08 ENCOUNTER — Other Ambulatory Visit: Payer: Self-pay

## 2021-09-08 DIAGNOSIS — M25551 Pain in right hip: Secondary | ICD-10-CM | POA: Diagnosis not present

## 2021-09-08 MED ORDER — PREDNISONE 5 MG PO TABS
ORAL_TABLET | ORAL | 1 refills | Status: DC
  Filled 2021-09-08: qty 21, 6d supply, fill #0

## 2021-09-08 NOTE — Progress Notes (Signed)
My hip hurts more.  She had injection of the right hip by Dr. Ernestina Patches when he saw her.  I had not appreciated that when I saw her.  Dr. Ernestina Patches contacted me.  X-rays of the right hip were done today, reported separately.  She has degenerative changes of the hip.   She has brain clips and cannot have MRI.  I will get CT scan of the hip.  She may need total hip.  ROM of the right hip is decreased, with flexion 90, internal 10, external 5.  She limps and uses cane.  NV intact.  Encounter Diagnosis  Name Primary?   Pain in right hip Yes   I will call in prednisone dose pack.  I will get CT of the hip.   Return to clinic next Tuesday or Thursday.  Call if any problem.  Precautions discussed.  Electronically Signed Sanjuana Kava, MD 11/10/20228:40 AM

## 2021-09-13 ENCOUNTER — Ambulatory Visit: Payer: Medicare HMO | Admitting: Orthopaedic Surgery

## 2021-09-15 ENCOUNTER — Telehealth: Payer: Self-pay | Admitting: Orthopaedic Surgery

## 2021-09-15 NOTE — Telephone Encounter (Signed)
CT moved up to 09/19/21, appt made with Dr Raliegh Ip 09/20/21.

## 2021-09-15 NOTE — Telephone Encounter (Signed)
She has finished her predniSONE (DELTASONE) 5 MG tablet.  Is she suppose to start it again?  She finished taking them on Tuesday 09/13/21  She states it didn't help her, she is still in pain   Please call her back at [phone]

## 2021-09-15 NOTE — Telephone Encounter (Signed)
I tried to call the patient back, she did not pick up left voicemail to call our office back.

## 2021-09-19 ENCOUNTER — Ambulatory Visit (HOSPITAL_COMMUNITY)
Admission: RE | Admit: 2021-09-19 | Discharge: 2021-09-19 | Disposition: A | Discharge status: Home / Self Care | Payer: Medicare HMO | Source: Ambulatory Visit | Attending: Orthopaedic Surgery | Admitting: Orthopaedic Surgery

## 2021-09-19 ENCOUNTER — Other Ambulatory Visit: Payer: Self-pay

## 2021-09-19 DIAGNOSIS — M25551 Pain in right hip: Secondary | ICD-10-CM | POA: Diagnosis not present

## 2021-09-19 DIAGNOSIS — Z944 Liver transplant status: Principal | ICD-10-CM

## 2021-09-19 DIAGNOSIS — Z796 Long-term use of immunosuppressant medication: Principal | ICD-10-CM

## 2021-09-20 ENCOUNTER — Encounter: Payer: Self-pay | Admitting: Orthopaedic Surgery

## 2021-09-20 ENCOUNTER — Ambulatory Visit (INDEPENDENT_AMBULATORY_CARE_PROVIDER_SITE_OTHER): Payer: Medicare HMO | Admitting: Orthopaedic Surgery

## 2021-09-20 DIAGNOSIS — M1611 Unilateral primary osteoarthritis, right hip: Secondary | ICD-10-CM

## 2021-09-20 DIAGNOSIS — Z940 Kidney transplant status: Secondary | ICD-10-CM | POA: Diagnosis not present

## 2021-09-20 DIAGNOSIS — M25551 Pain in right hip: Secondary | ICD-10-CM

## 2021-09-20 NOTE — Progress Notes (Signed)
My hip hurts more.  She has chronic pain of the right hip getting worse.  There was a delay getting CT of the hip but it is done and shows: IMPRESSION: 1. Degenerative changes of the right hip with no acute osseous abnormality identified. 2. Findings suggestive of chronic tendinosis of the proximal hamstring tendons. 3. Colonic diverticulosis and fibroid uterus noted.   I does show some cysts and decreased joint space.  I have explained the findings to her.  I will have her see Dr. Ninfa Linden about possible surgery.  She has to use a cane and take pan medicine.  She has no new trauma.  I have independently reviewed and interpreted x-rays of this patient done at another site by another physician or qualified health professional.   ROM of the right hip is reduced.  She has pain with ambulation.  NV intact.  Encounter Diagnoses  Name Primary?   Pain in right hip Yes   Renal transplant, status post    To see Dr. Ninfa Linden.  I will see her in three weeks.  Call if any problem.  Precautions discussed.  Electronically Signed Sanjuana Kava, MD 11/22/20229:36 AM

## 2021-09-20 NOTE — Progress Notes (Signed)
.  res

## 2021-09-20 NOTE — Patient Instructions (Signed)
Schedule an appointment with Dr. Ninfa Linden at [facility] to discuss an artificial hip. Appointment needs to be made as soon as possible. If you have any questions or have difficulties scheduling an appointment soon please call our office and speak to [place].

## 2021-09-21 LAB — CBC W/ DIFFERENTIAL
BANDED NEUTROPHILS ABSOLUTE COUNT: 0 10*3/uL (ref 0.0–0.1)
BASOPHILS ABSOLUTE COUNT: 0.1 10*3/uL (ref 0.0–0.2)
BASOPHILS RELATIVE PERCENT: 1 %
EOSINOPHILS ABSOLUTE COUNT: 0.2 10*3/uL (ref 0.0–0.4)
EOSINOPHILS RELATIVE PERCENT: 3 %
HEMATOCRIT: 44.1 % (ref 34.0–46.6)
HEMOGLOBIN: 14 g/dL (ref 11.1–15.9)
IMMATURE GRANULOCYTES: 0 %
LYMPHOCYTES ABSOLUTE COUNT: 1.6 10*3/uL (ref 0.7–3.1)
LYMPHOCYTES RELATIVE PERCENT: 30 %
MEAN CORPUSCULAR HEMOGLOBIN CONC: 31.7 g/dL (ref 31.5–35.7)
MEAN CORPUSCULAR HEMOGLOBIN: 27.1 pg (ref 26.6–33.0)
MEAN CORPUSCULAR VOLUME: 85 fL (ref 79–97)
MONOCYTES ABSOLUTE COUNT: 0.6 10*3/uL (ref 0.1–0.9)
MONOCYTES RELATIVE PERCENT: 11 %
NEUTROPHILS ABSOLUTE COUNT: 2.9 10*3/uL (ref 1.4–7.0)
NEUTROPHILS RELATIVE PERCENT: 55 %
PLATELET COUNT: 386 10*3/uL (ref 150–450)
RED BLOOD CELL COUNT: 5.17 x10E6/uL (ref 3.77–5.28)
RED CELL DISTRIBUTION WIDTH: 14 % (ref 11.7–15.4)
WHITE BLOOD CELL COUNT: 5.4 10*3/uL (ref 3.4–10.8)

## 2021-09-21 LAB — COMPREHENSIVE METABOLIC PANEL
A/G RATIO: 1.6 (ref 1.2–2.2)
ALBUMIN: 3.6 g/dL — ABNORMAL LOW (ref 3.8–4.8)
ALKALINE PHOSPHATASE: 65 IU/L (ref 44–121)
ALT (SGPT): 18 IU/L (ref 0–32)
AST (SGOT): 19 IU/L (ref 0–40)
BILIRUBIN TOTAL: <0.2 mg/dL (ref 0.0–1.2)
BLOOD UREA NITROGEN: 8 mg/dL (ref 8–27)
BUN / CREAT RATIO: 11 — ABNORMAL LOW (ref 12–28)
CALCIUM: 8.9 mg/dL (ref 8.7–10.3)
CHLORIDE: 104 mmol/L (ref 96–106)
CO2: 27 mmol/L (ref 20–29)
CREATININE: 0.71 mg/dL (ref 0.57–1.00)
GLOBULIN, TOTAL: 2.3 g/dL (ref 1.5–4.5)
GLUCOSE: 117 mg/dL — ABNORMAL HIGH (ref 70–99)
POTASSIUM: 4.6 mmol/L (ref 3.5–5.2)
SODIUM: 142 mmol/L (ref 134–144)
TOTAL PROTEIN: 5.9 g/dL — ABNORMAL LOW (ref 6.0–8.5)

## 2021-09-21 LAB — BILIRUBIN, DIRECT: BILIRUBIN DIRECT: <0.1 mg/dL (ref 0.00–0.40)

## 2021-09-21 LAB — MAGNESIUM: MAGNESIUM: 1.6 mg/dL (ref 1.6–2.3)

## 2021-09-21 LAB — PHOSPHORUS: PHOSPHORUS, SERUM: 2.7 mg/dL — ABNORMAL LOW (ref 3.0–4.3)

## 2021-09-21 LAB — GAMMA GT: GAMMA GLUTAMYL TRANSFERASE: 40 IU/L (ref 0–60)

## 2021-09-21 NOTE — Unmapped (Signed)
[facility] Refill Coordination Note    Specialty Medication(s) to be Shipped:   Transplant: Myfortic 180mg     Other medication(s) to be shipped: No additional medications requested for fill at this time     Michelle Travis, DOB: 1956/01/22  Phone: [phone] (home)       All above HIPAA information was verified with patient.     Was a Nurse, learning disability used for this call? No    Completed refill call assessment today to schedule patient's medication shipment from the [facility] [phone]).  All relevant notes have been reviewed.     Specialty medication(s) and dose(s) confirmed: Regimen is correct and unchanged.   Changes to medications: Laurin reports no changes at this time.  Changes to insurance: No  New side effects reported not previously addressed with a pharmacist or physician: None reported  Questions for the pharmacist: No    Confirmed patient received a Conservation officer, historic buildings and a Surveyor, mining with first shipment. The patient will receive a drug information handout for each medication shipped and additional FDA Medication Guides as required.       DISEASE/MEDICATION-SPECIFIC INFORMATION        N/A    SPECIALTY MEDICATION ADHERENCE     Medication Adherence    Patient reported X missed doses in the last month: 0  Specialty Medication: Myfortic 180mg   Patient is on additional specialty medications: No  Adherence tools used: patient uses a pill box to manage medications              Were doses missed due to medication being on hold? No    Myfortic 180 mg: 7 days of medicine on hand       REFERRAL TO PHARMACIST     Referral to the pharmacist: Not needed      [facility]     Shipping address confirmed in Epic.     Delivery Scheduled: Yes, Expected medication delivery date: 09/28/21.     Medication will be delivered via UPS to the prescription address in Epic WAM.    [name]   [facility] Specialty Pharmacist

## 2021-09-23 LAB — TACROLIMUS LEVEL: TACROLIMUS BLOOD: 4.2 ng/mL (ref 2.0–20.0)

## 2021-09-26 ENCOUNTER — Ambulatory Visit (INDEPENDENT_AMBULATORY_CARE_PROVIDER_SITE_OTHER): Payer: Medicare HMO | Admitting: Orthopaedic Surgery

## 2021-09-26 VITALS — Ht 64.0 in | Wt 201.4 lb

## 2021-09-26 DIAGNOSIS — M1611 Unilateral primary osteoarthritis, right hip: Secondary | ICD-10-CM | POA: Diagnosis not present

## 2021-09-26 NOTE — Unmapped (Signed)
Called pt and left vm reviewing 11/22 labs. Requested a return call to confirm receipt.

## 2021-09-26 NOTE — Progress Notes (Signed)
Office Visit Note   Patient: Savannah Jackson           Date of Birth: [birth year]           MRN: [id number] Visit Date: 09/26/2021              Requested by: Rosita Fire, MD [address] [place],  [place] [address] PCP: Rosita Fire, MD   Assessment & Plan: Visit Diagnoses:  1. Unilateral primary osteoarthritis, right hip     Plan: I agree that the patient does have significant painful arthritis of her right hip and we are agreeing in recommending a hip replacement surgery for her right hip.  She has tried and failed all forms of conservative treatment and given the severity of her hip pain and the detrimental effect this is having on her quality of life and her mobility as well as her actives daily living we are recommending a hip replacement.  I went over hip replacement surgery with her in detail and described the risk and benefits of the surgery.  I talked about what to expect from intraoperative and postoperative course.  All questions and concerns were answered and addressed.  We will work on getting this scheduled.  Follow-Up Instructions: Return for 2 weeks post-op.   Orders:  No orders of the defined types were placed in this encounter.  No orders of the defined types were placed in this encounter.     Procedures: No procedures performed   Clinical Data: No additional findings.   Subjective: Chief Complaint  Patient presents with   Right Hip - Pain  The patient is a very nice 65 year old female with well-documented significant arthritis in her right hip.  She is sent from Dr. Luna Glasgow with orthopedics in [place] to evaluate and treat her for her painful arthritis of the right hip with considering hip replacement surgery given the failure of conservative treatment.  She has had intra-articular injections which lasted a few weeks and help temporize her pain.  She is having 10 out of 10 pain with her right hip.  It is definitely affecting her mobility,  her quality of life and actives daily living.  She is on chronic pain management as well.  She does ambulate using a cane or opposite hand.  She has had a history of a right knee replacement.  She also has a history of liver and kidney transplants.  She is healthy from that standpoint and is not on dialysis.  She is interested in hip replacement surgery at this point given the failure of all forms conservative treatment for well over a year with her right hip.  HPI  Review of Systems Today she denies any headache, chest pain, shortness of breath, fever, chills, nausea, vomiting  Objective: Vital Signs: Ht 5\' 4"  (1.626 m)   Wt 201 lb 6.1 oz (91.3 kg)   BMI 34.57 kg/m   Physical Exam She is alert and orient x3 and in no acute distress Ortho Exam Examination of her right hip shows significant pain with internal and external rotation.  The pain seems to be in the groin.  Her left hip moves smoothly and normally. Specialty Comments:  No specialty comments available.  Imaging: No results found. Plain films and CT scan of her pelvis and right hip show moderate arthritis with significant joint space narrowing and peritubular osteophytes as well as cystic changes.  PMFS History: Patient Active Problem List   Diagnosis Date Noted   Unilateral primary  osteoarthritis, right hip 09/26/2021   Immunosuppressed status ([place]) 03/22/2020   Encounter for gynecological examination with Papanicolaou smear of cervix 11/13/2019   Screening for colorectal cancer 11/13/2019   History of colonic polyps 05/17/2018   Aftercare following organ transplant 10/17/2017   Kidney transplanted 09/14/2017   Status post liver transplantation ([place]) 09/14/2017   Acute encephalopathy    Hyperammonemia ([place]) 07/15/2017   ESRD on dialysis [facility])    Rectus sheath hematoma 11/22/2016   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   S/P thoracentesis    Acute renal failure superimposed on stage  4 chronic kidney disease ([place])    Hypoalbuminemia 10/29/2016   Leukocytosis 70/48/8891   Diastolic dysfunction with chronic heart failure ([place]) 10/29/2016   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   Chronic kidney disease (CKD) stage G3b/A3, moderately decreased glomerular filtration rate (GFR) between 30-44 mL/min/1.73 square meter and albuminuria creatinine ratio greater than 300 mg/g (HCC) 08/30/2016   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 08/30/2016   Thickened endometrium 06/19/2016   PMB (postmenopausal bleeding) 06/07/2016   History of breast cancer 06/07/2016   Cough    Esophageal varices ([place]) 69/45/0388   Alcoholic cirrhosis of liver without ascites ([place])    Anemia of chronic disease    Peripheral edema 11/26/2015   Anasarca 82/80/0349   Metabolic acidosis 17/91/5056   UTI (lower urinary tract infection) 09/24/2015   Elevated troponin 09/24/2015   Altered mental status 08/30/2015   Hepatic encephalopathy 08/30/2015   Acute respiratory failure with hypoxia (HCC)    Recurrent right pleural effusion    Alcoholic cirrhosis of liver with ascites (HCC)    Pleural effusion associated with hepatic disorder 07/03/2015   Chronic kidney disease (CKD), stage IV (severe) ([place]) 07/03/2015   Elevated INR 07/03/2015   Acute respiratory distress 07/03/2015   BRBPR (bright red blood per rectum) 04/21/2015   Pleural effusion, right 04/21/2015   Sleep apnea    Chronic back pain    ETOH abuse    Bilateral leg edema    Cirrhosis, alcoholic ([place]) 97/94/8016   Abdominal pain, left lower quadrant 02/17/2013   Diverticulitis of colon without hemorrhage 02/17/2013   Hypertension    Past Medical History:  Diagnosis Date   Bilateral leg edema    Brain aneurysm    Breast disorder    right breast cancer 2002   Cancer [facility])    breast cancer- lumpectromy, , chemotherapy, radiation   Chronic back pain Diverticultis   Chronic kidney disease (CKD) stage G3b/A3, moderately decreased  glomerular filtration rate (GFR) between 30-44 mL/min/1.73 square meter and albuminuria creatinine ratio greater than 300 mg/g ([place]) 08/30/2016   Cirrhosis ([place])    alcoholic   DDD (degenerative disc disease), lumbar    Diverticulosis    scope 2014   ETOH abuse    Hepatic encephalopathy 04/23/2017   Hepatomegaly    scope 2014   History of breast cancer 06/07/2016   Hypertension    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   Pneumonia    Rotator cuff syndrome of left shoulder    Sleep apnea    does not wear CPAP   Steal syndrome as complication of dialysis access ([place])    Thickened endometrium 06/19/2016   Will get endo biopsy    Wears glasses     Family History  Problem Relation Age of Onset   Aneurysm Mother    Other Daughter        knee  replacement   Hypertension Daughter     Past Surgical History:  Procedure Laterality Date   AV FISTULA PLACEMENT Left 05/22/2017   Procedure: BRACHIAL ARTERY TO BASCILIC VEIN  FISTULA CREATION;  Surgeon: Elam Dutch, MD;  Location: [place];  Service: Vascular;  Laterality: Left;   [place] Left 08/07/2017   Procedure: BASILIC VEIN TRANSPOSITION SECOND STAGE;  Surgeon: Elam Dutch, MD;  Location[facility];  Service: Vascular;  Laterality: Left;   BRAIN SURGERY     aneurysm at [place] Right    cancer- lunmmnpectomy-    CATARACT EXTRACTION Bilateral    [facility] 2 or 3 years ago   CHOLECYSTECTOMY     COLONOSCOPY N/A 04/10/2013   Procedure: COLONOSCOPY;  Surgeon: Rogene Houston, MD;  [facility];  Service: Endoscopy;  Laterality: N/A;  1030-rescheduled to 1200 Ann notified pt   COLONOSCOPY N/A 08/07/2018   Procedure: COLONOSCOPY;  Surgeon: Rogene Houston, MD;  [facility];  Service: Endoscopy;  Laterality: N/A;  930   ESOPHAGOGASTRODUODENOSCOPY N/A 04/22/2015   Procedure: ESOPHAGOGASTRODUODENOSCOPY  (EGD);  Surgeon: Rogene Houston, MD;  [facility];  Service: Endoscopy;  Laterality: N/A;   EYE SURGERY     IR GENERIC HISTORICAL  07/04/2016   IR RADIOLOGIST EVAL & MGMT 07/04/2016 Jacqulynn Cadet, MD [facility] INTERV RAD   IR RADIOLOGIST EVAL & MGMT  07/24/2017   KIDNEY SURGERY     KNEE ARTHROSCOPY Left    LIVER TRANSPLANT     POLYPECTOMY  08/07/2018   Procedure: POLYPECTOMY;  Surgeon: Rogene Houston, MD;  [facility];  Service: Endoscopy;;  colon    RADIOLOGY WITH ANESTHESIA N/A 07/16/2015   Procedure: TIPS;  Surgeon: Medication Radiologist, MD;  Location: [facility];  Service: Radiology;  Laterality: N/A;   REVISON OF ARTERIOVENOUS FISTULA Left 01/06/2019   Procedure: LEFT ARM LIGATION OF ARTERIOVENOUS FISTULA;  Surgeon: Elam Dutch, MD;  Location: [facility];  Service: Vascular;  Laterality: Left;   ROTATOR CUFF REPAIR Left    TOTAL KNEE ARTHROPLASTY Right    right. 2002   Social History   Occupational History   Occupation: disabled  Tobacco Use   Smoking status: Never   Smokeless tobacco: Never  Vaping Use   Vaping Use: Never used  Substance and Sexual Activity   Alcohol use: No    Alcohol/week: 0.0 standard drinks    Comment: none since 03/2015   Drug use: No   Sexual activity: Not Currently    Birth control/protection: Post-menopausal

## 2021-09-27 MED FILL — MYFORTIC 180 MG TABLET,DELAYED RELEASE: ORAL | 30 days supply | Qty: 120 | Fill #10

## 2021-10-06 ENCOUNTER — Telehealth: Payer: Self-pay | Admitting: Radiology

## 2021-10-06 NOTE — Telephone Encounter (Signed)
Patient called our office and asked if we'd relay to you to have you call her?  She knows THA surgery with Dr Ninfa Linden will be in January, but wants to know what date if she could, so she can make plans.  Please call her home number first, if no answer call cell.  She knows you Danuel Felicetti not call today.

## 2021-10-07 NOTE — Telephone Encounter (Signed)
I called patient and scheduled surgery. 

## 2021-10-12 NOTE — Unmapped (Signed)
TRF

## 2021-10-13 ENCOUNTER — Ambulatory Visit (HOSPITAL_COMMUNITY): Payer: Medicare HMO

## 2021-10-13 ENCOUNTER — Other Ambulatory Visit: Payer: Self-pay

## 2021-10-17 DIAGNOSIS — Z796 Long-term use of immunosuppressant medication: Principal | ICD-10-CM

## 2021-10-17 DIAGNOSIS — Z944 Liver transplant status: Principal | ICD-10-CM

## 2021-10-18 ENCOUNTER — Ambulatory Visit: Payer: Medicare HMO | Admitting: Orthopaedic Surgery

## 2021-10-18 NOTE — Unmapped (Addendum)
[facility] Refill Coordination Note    Specialty Medication(s) to be Shipped:   Transplant: Myfortic 180mg     Other medication(s) to be shipped: No additional medications requested for fill at this time     Michelle Travis, DOB: [birth year]  Phone: [phone] (home)       All above HIPAA information was verified with patient.     Was a Nurse, learning disability used for this call? No    Completed refill call assessment today to schedule patient's medication shipment from the [facility] [phone]).  All relevant notes have been reviewed.     Specialty medication(s) and dose(s) confirmed: Regimen is correct and unchanged.   Changes to medications: Michelle Travis reports no changes at this time.  Changes to insurance: No  New side effects reported not previously addressed with a pharmacist or physician: None reported  Questions for the pharmacist: No    Confirmed patient received a Conservation officer, historic buildings and a Surveyor, mining with first shipment. The patient will receive a drug information handout for each medication shipped and additional FDA Medication Guides as required.       DISEASE/MEDICATION-SPECIFIC INFORMATION        N/A    SPECIALTY MEDICATION ADHERENCE     Medication Adherence    Patient reported X missed doses in the last month: 0  Specialty Medication: Myfortic 180mg   Patient is on additional specialty medications: No  Adherence tools used: patient uses a pill box to manage medications              Were doses missed due to medication being on hold? No    Myfortic 180 mg: 7 days of medicine on hand       REFERRAL TO PHARMACIST     Referral to the pharmacist: Not needed      [facility]     Shipping address confirmed in Epic.     Delivery Scheduled: Yes, Expected medication delivery date: 10/21/21.     Medication will be delivered via UPS to the prescription address in Epic WAM.    Michelle Travis   [facility] Specialty Pharmacist

## 2021-10-20 DIAGNOSIS — N042 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: Principal | ICD-10-CM

## 2021-10-20 MED FILL — MYFORTIC 180 MG TABLET,DELAYED RELEASE: ORAL | 30 days supply | Qty: 120 | Fill #11

## 2021-11-03 ENCOUNTER — Telehealth: Payer: Self-pay | Admitting: Orthopaedic Surgery

## 2021-11-03 NOTE — Telephone Encounter (Signed)
Patient called and wants to know if she still needs to come in on 10th since she is having surgery on the 20th.  Please call her back and let her know  [phone]

## 2021-11-07 ENCOUNTER — Other Ambulatory Visit: Payer: Self-pay | Admitting: Physician Assistant

## 2021-11-07 DIAGNOSIS — M1611 Unilateral primary osteoarthritis, right hip: Secondary | ICD-10-CM

## 2021-11-07 NOTE — Progress Notes (Signed)
Sent message, via epic in basket, requesting orders in epic from surgeon.  

## 2021-11-08 ENCOUNTER — Ambulatory Visit: Payer: Medicare HMO | Admitting: Orthopaedic Surgery

## 2021-11-10 NOTE — Unmapped (Signed)
Attempted to contact patient to r/s appt but no answer/vm

## 2021-11-10 NOTE — Progress Notes (Signed)
Covid test on 11/16/2021 at 145 pm.             Your procedure is scheduled on:       11/18/2021.    Report to [facility] Main  Entrance   Report to admitting at   [phone]     Call this number if you have problems the morning of surgery [phone]    REMEMBER: NO  SOLID FOOD CANDY OR GUM AFTER MIDNIGHT. CLEAR LIQUIDS UNTIL    0800am        . NOTHING BY MOUTH EXCEPT CLEAR LIQUIDS UNTIL   0800am  . PLEASE FINISH ENSURE DRINK PER SURGEON ORDER  WHICH NEEDS TO BE COMPLETED AT  0800am     .      CLEAR LIQUID DIET   Foods Allowed                                                                    Coffee and tea, regular and decaf                            Fruit ices (not with fruit pulp)                                      Iced Popsicles                                    Carbonated beverages, regular and diet                                    Cranberry, grape and apple juices Sports drinks like Gatorade Lightly seasoned clear broth or consume(fat free) Sugar, honey syrup ___________________________________________________________________      BRUSH YOUR TEETH MORNING OF SURGERY AND RINSE YOUR MOUTH OUT, NO CHEWING GUM CANDY OR MINTS.     Take these medicines the morning of surgery with A SIP OF WATER:  Protonix envarsus, myfortic     DO NOT TAKE ANY DIABETIC MEDICATIONS DAY OF YOUR SURGERY                               You may not have any metal on your body including hair pins and              piercings  Do not wear jewelry, make-up, lotions, powders or perfumes, deodorant             Do not wear nail polish on your fingernails.  Do not shave  48 hours prior to surgery.              Men may shave face and neck.   Do not bring valuables to the hospital. [place].  Contacts, dentures or bridgework may not be worn into surgery.  Leave suitcase in the car.  After surgery it may be brought to your  room.     Patients discharged the day of surgery will not be allowed to drive home. IF YOU ARE HAVING SURGERY AND GOING HOME THE SAME DAY, YOU MUST HAVE AN ADULT TO DRIVE YOU HOME AND BE WITH YOU FOR 24 HOURS. YOU MAY GO HOME BY TAXI OR UBER OR ORTHERWISE, BUT AN ADULT MUST ACCOMPANY YOU HOME AND STAY WITH YOU FOR 24 HOURS.  Name and phone number of your driver:  Special Instructions: N/A              Please read over the following fact sheets you were given: _____________________________________________________________________  [facility] - Preparing for Surgery Before surgery, you can play an important role.  Because skin is not sterile, your skin needs to be as free of germs as possible.  You can reduce the number of germs on your skin by washing with CHG (chlorahexidine gluconate) soap before surgery.  CHG is an antiseptic cleaner which kills germs and bonds with the skin to continue killing germs even after washing. Please DO NOT use if you have an allergy to CHG or antibacterial soaps.  If your skin becomes reddened/irritated stop using the CHG and inform your nurse when you arrive at Short Stay. Do not shave (including legs and underarms) for at least 48 hours prior to the first CHG shower.  You may shave your face/neck. Please follow these instructions carefully:  1.  Shower with CHG Soap the night before surgery and the  morning of Surgery.  2.  If you choose to wash your hair, wash your hair first as usual with your  normal  shampoo.  3.  After you shampoo, rinse your hair and body thoroughly to remove the  shampoo.                           4.  Use CHG as you would any other liquid soap.  You can apply chg directly  to the skin and wash                       Gently with a scrungie or clean washcloth.  5.  Apply the CHG Soap to your body ONLY FROM THE NECK DOWN.   Do not use on face/ open                           Wound or open sores. Avoid contact with eyes, ears mouth and genitals  (private parts).                       Wash face,  Genitals (private parts) with your normal soap.             6.  Wash thoroughly, paying special attention to the area where your surgery  will be performed.  7.  Thoroughly rinse your body with warm water from the neck down.  8.  DO NOT shower/wash with your normal soap after using and rinsing off  the CHG Soap.                9.  Pat yourself dry with a clean towel.            10.  Wear clean pajamas.            11.  Place clean sheets on your  bed the night of your first shower and do not  sleep with pets. Day of Surgery : Do not apply any lotions/deodorants the morning of surgery.  Please wear clean clothes to the hospital/surgery center.  FAILURE TO FOLLOW THESE INSTRUCTIONS MAY RESULT IN THE CANCELLATION OF YOUR SURGERY PATIENT SIGNATURE_________________________________  NURSE SIGNATURE__________________________________  ________________________________________________________________________

## 2021-11-10 NOTE — Progress Notes (Addendum)
Anesthesia Review:  PCP: DR Venita Sheffield  Cardiologist : none  Kidney transplant MD- DR Det wilder [facility]  Liver Transplant- [place] - Coordinator  Both liver and kidney transplants were done at same time in 2018  Donor was man from [place] per pt  Chest x-ray : EKG : 11/11/21  Echo : Stress test: Cardiac Cath :  Activity level: can do a flight of stairs without difficulty  Sleep Study/ CPAP : has slapp apena- no cpap  Fasting Blood Sugar :      / Checks Blood Sugar -- times a day:   Blood Thinner/ Instructions /Last Dose: ASA / Instructions/ Last Dose :   Covid test on 11/16/2021 at 145pm  Use right arm for venipunctures and blood pressure  CMP done 11/11/21 routeid to DR Ninfa Linden.

## 2021-11-11 ENCOUNTER — Encounter (HOSPITAL_COMMUNITY): Payer: Self-pay | Admitting: Physician Assistant

## 2021-11-11 ENCOUNTER — Other Ambulatory Visit: Payer: Self-pay

## 2021-11-11 ENCOUNTER — Encounter (HOSPITAL_COMMUNITY): Payer: Self-pay

## 2021-11-11 ENCOUNTER — Encounter (HOSPITAL_COMMUNITY)
Admission: RE | Admit: 2021-11-11 | Discharge: 2021-11-11 | Disposition: A | Discharge status: Home / Self Care | Payer: Medicare HMO | Source: Ambulatory Visit | Attending: Orthopaedic Surgery | Admitting: Orthopaedic Surgery

## 2021-11-11 VITALS — BP 142/88 | HR 73 | Temp 99.2°F | Resp 16 | Ht 64.0 in | Wt 200.0 lb

## 2021-11-11 DIAGNOSIS — M1611 Unilateral primary osteoarthritis, right hip: Secondary | ICD-10-CM | POA: Insufficient documentation

## 2021-11-11 DIAGNOSIS — G4730 Sleep apnea, unspecified: Secondary | ICD-10-CM | POA: Insufficient documentation

## 2021-11-11 DIAGNOSIS — N183 Chronic kidney disease, stage 3 unspecified: Secondary | ICD-10-CM | POA: Insufficient documentation

## 2021-11-11 DIAGNOSIS — I120 Hypertensive chronic kidney disease with stage 5 chronic kidney disease or end stage renal disease: Secondary | ICD-10-CM | POA: Diagnosis not present

## 2021-11-11 DIAGNOSIS — Z01818 Encounter for other preprocedural examination: Secondary | ICD-10-CM | POA: Diagnosis present

## 2021-11-11 DIAGNOSIS — Z9989 Dependence on other enabling machines and devices: Secondary | ICD-10-CM | POA: Diagnosis not present

## 2021-11-11 DIAGNOSIS — Z944 Liver transplant status: Secondary | ICD-10-CM | POA: Diagnosis not present

## 2021-11-11 DIAGNOSIS — Z940 Kidney transplant status: Secondary | ICD-10-CM | POA: Diagnosis not present

## 2021-11-11 LAB — CBC
HCT: 44.9 % (ref 36.0–46.0)
Hemoglobin: 13.5 g/dL (ref 12.0–15.0)
MCH: 26.4 pg (ref 26.0–34.0)
MCHC: 30.1 g/dL (ref 30.0–36.0)
MCV: 87.9 fL (ref 80.0–100.0)
Platelets: 341 10*3/uL (ref 150–400)
RBC: 5.11 MIL/uL (ref 3.87–5.11)
RDW: 14.5 % (ref 11.5–15.5)
WBC: 6.4 10*3/uL (ref 4.0–10.5)
nRBC: 0 % (ref 0.0–0.2)

## 2021-11-11 LAB — COMPREHENSIVE METABOLIC PANEL
ALT: 28 U/L (ref 0–44)
AST: 35 U/L (ref 15–41)
Albumin: 2.5 g/dL — ABNORMAL LOW (ref 3.5–5.0)
Alkaline Phosphatase: 47 U/L (ref 38–126)
Anion gap: 6 (ref 5–15)
BUN: 12 mg/dL (ref 8–23)
CO2: 26 mmol/L (ref 22–32)
Calcium: 8.6 mg/dL — ABNORMAL LOW (ref 8.9–10.3)
Chloride: 106 mmol/L (ref 98–111)
Creatinine, Ser: 0.54 mg/dL (ref 0.44–1.00)
GFR, Estimated: >60 mL/min (ref >60)
Glucose, Bld: 106 mg/dL — ABNORMAL HIGH (ref 70–99)
Potassium: 5.2 mmol/L — ABNORMAL HIGH (ref 3.5–5.1)
Sodium: 138 mmol/L (ref 135–145)
Total Bilirubin: 0.6 mg/dL (ref 0.3–1.2)
Total Protein: 5.6 g/dL — ABNORMAL LOW (ref 6.5–8.1)

## 2021-11-11 LAB — SURGICAL PCR SCREEN
MRSA, PCR: NEGATIVE
Staphylococcus aureus: NEGATIVE

## 2021-11-13 DIAGNOSIS — N042 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: Principal | ICD-10-CM

## 2021-11-14 NOTE — Progress Notes (Signed)
Anesthesia Chart Review   Case: [id number] Date/Time: 11/18/21 1045   Procedure: RIGHT TOTAL HIP ARTHROPLASTY ANTERIOR APPROACH (Right: Hip)   Anesthesia type: Spinal   Pre-op diagnosis: osteoarthritis right hip   Location: [facility] ROOM 10 / [facility]   Surgeons: Mcarthur Rossetti, MD       DISCUSSION:65 y.o. never smoker with h/o HTN, CKD Stage III, sleep apnea with CPAP, s/p kidney and liver transplant 2018, right hip OA scheduled for above procedure 11/18/2021 with Dr. Jean Rosenthal.   Last seen in transplant nephrology clinic 07/20/2021. Stable at this visit with stable creatinine. LFTs normal.  Pt on Envarsus, Myfortic.  Per note, "patient was reassured that if TKR is necessary her transplant status does not represent a contraindication to surgery."  Anticipate pt can proceed with planned procedure barring acute status change.   VS: BP (!) 142/88    Pulse 73    Temp 37.3 C (Oral)    Resp 16    Ht 5\' 4"  (1.626 m)    Wt 90.7 kg    SpO2 100%    BMI 34.33 kg/m   PROVIDERS: Rosita Fire, MD is PCP    LABS: Labs reviewed: Acceptable for surgery. (all labs ordered are listed, but only abnormal results are displayed)  Labs Reviewed  COMPREHENSIVE METABOLIC PANEL - Abnormal; Notable for the following components:      Result Value   Potassium 5.2 (*)    Glucose, Bld 106 (*)    Calcium 8.6 (*)    Total Protein 5.6 (*)    Albumin 2.5 (*)    All other components within normal limits  SURGICAL PCR SCREEN  CBC  TYPE AND SCREEN     IMAGES:   EKG: 11/11/2021 Rate 74 bpm  Normal sinus rhythm Left ventricular hypertrophy with repolarization abnormality ( R in aVL , Cornell product , Romhilt-Estes ) Abnormal ECG When compared with ECG of 06-Jan-2019 05:07, PREVIOUS ECG IS PRESENT No significant change since last tracing  CV: Echo 07/06/2015 - Left ventricle: The cavity size was normal. Systolic function was    vigorous. The estimated ejection fraction was in the range of  65%    to 70%. Wall motion was normal; there were no regional wall    motion abnormalities. Doppler parameters are consistent with    abnormal left ventricular relaxation (grade 1 diastolic    dysfunction).  - Mitral valve: Severely calcified annulus.  - Tricuspid valve: There was moderate regurgitation.  - Pulmonary arteries: Systolic pressure was mildly increased. PA    peak pressure: 31 mm Hg (S).  - Pericardium, extracardiac: There was a large right pleural    effusion with large echogenic crescent shaped mass, mobile but    adhered to posterior aspect of left atrium. This large pleural    effusion inhibits right atrial free wall resulting in diastolic    invagination. There is respiratory varaition of tricuspid Doppler    inflow.  Past Medical History:  Diagnosis Date   Bilateral leg edema    Brain aneurysm    Breast disorder    right breast cancer 2002   Cancer [facility])    breast cancer- lumpectromy, , chemotherapy, radiation   Chronic back pain Diverticultis   Chronic kidney disease (CKD) stage G3b/A3, moderately decreased glomerular filtration rate (GFR) between 30-44 mL/min/1.73 square meter and albuminuria creatinine ratio greater than 300 mg/g (HCC) 08/30/2016   Cirrhosis ([place])    alcoholic   DDD (degenerative disc disease), lumbar  Diverticulosis    scope 2014   ETOH abuse    Hepatic encephalopathy 04/23/2017   Hepatomegaly    scope 2014   History of breast cancer 06/07/2016   Hypertension    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   Pneumonia    Rotator cuff syndrome of left shoulder    Sleep apnea    does not wear CPAP   Steal syndrome as complication of dialysis access ([place])    Thickened endometrium 06/19/2016   Will get endo biopsy    Wears glasses     Past Surgical History:  Procedure Laterality Date   AV FISTULA PLACEMENT Left 05/22/2017   Procedure: BRACHIAL ARTERY TO  [place];  Surgeon: Elam Dutch, MD;  Location: [place];  Service: Vascular;  Laterality: Left;   [place] Left 08/07/2017   Procedure: BASILIC VEIN TRANSPOSITION SECOND STAGE;  Surgeon: Elam Dutch, MD;  Location: [place];  Service: Vascular;  Laterality: Left;   BRAIN SURGERY     aneurysm at [place] Right    cancer- lunmmnpectomy-    CATARACT EXTRACTION Bilateral    [facility] 2 or 3 years ago   CHOLECYSTECTOMY     COLONOSCOPY N/A 04/10/2013   Procedure: COLONOSCOPY;  Surgeon: Rogene Houston, MD;  [facility];  Service: Endoscopy;  Laterality: N/A;  1030-rescheduled to 1200 Ann notified pt   COLONOSCOPY N/A 08/07/2018   Procedure: COLONOSCOPY;  Surgeon: Rogene Houston, MD;  [facility];  Service: Endoscopy;  Laterality: N/A;  930   ESOPHAGOGASTRODUODENOSCOPY N/A 04/22/2015   Procedure: ESOPHAGOGASTRODUODENOSCOPY (EGD);  Surgeon: Rogene Houston, MD;  [facility];  Service: Endoscopy;  Laterality: N/A;   EYE SURGERY     IR GENERIC HISTORICAL  07/04/2016   IR RADIOLOGIST EVAL & MGMT 07/04/2016 Jacqulynn Cadet, MD [facility] INTERV RAD   IR RADIOLOGIST EVAL & MGMT  07/24/2017   KIDNEY SURGERY     KNEE ARTHROSCOPY Left    LIVER TRANSPLANT     POLYPECTOMY  08/07/2018   Procedure: POLYPECTOMY;  Surgeon: Rogene Houston, MD;  [facility];  Service: Endoscopy;;  colon    RADIOLOGY WITH ANESTHESIA N/A 07/16/2015   Procedure: TIPS;  Surgeon: Medication Radiologist, MD;  Location: [place];  Service: Radiology;  Laterality: N/A;   REVISON OF ARTERIOVENOUS FISTULA Left 01/06/2019   Procedure: LEFT ARM LIGATION OF ARTERIOVENOUS FISTULA;  Surgeon: Elam Dutch, MD;  [facility];  Service: Vascular;  Laterality: Left;   ROTATOR CUFF REPAIR Left    TOTAL KNEE ARTHROPLASTY Right    right. 2002    MEDICATIONS:  Acetaminophen 325 MG CAPS   aspirin EC 81 MG tablet   atorvastatin (LIPITOR) 10 MG  tablet   BELBUCA 300 MCG FILM   cholecalciferol (VITAMIN D) 1000 units tablet   ENVARSUS XR 1 MG TB24   mycophenolate (MYFORTIC) 180 MG EC tablet   Oxycodone HCl 10 MG TABS   pantoprazole (PROTONIX) 40 MG tablet   No current facility-administered medications for this encounter.     Konrad Felix Ward, PA-C [facility] [phone]

## 2021-11-16 ENCOUNTER — Other Ambulatory Visit (HOSPITAL_COMMUNITY): Payer: Medicare HMO

## 2021-11-16 ENCOUNTER — Other Ambulatory Visit: Payer: Self-pay

## 2021-11-16 ENCOUNTER — Encounter (HOSPITAL_COMMUNITY)
Admission: RE | Admit: 2021-11-16 | Discharge: 2021-11-16 | Disposition: A | Discharge status: Home / Self Care | Payer: Medicare HMO | Source: Ambulatory Visit | Attending: Orthopaedic Surgery | Admitting: Orthopaedic Surgery

## 2021-11-16 DIAGNOSIS — U071 COVID-19: Secondary | ICD-10-CM | POA: Insufficient documentation

## 2021-11-16 DIAGNOSIS — Z01818 Encounter for other preprocedural examination: Secondary | ICD-10-CM

## 2021-11-16 DIAGNOSIS — Z01812 Encounter for preprocedural laboratory examination: Secondary | ICD-10-CM | POA: Diagnosis present

## 2021-11-16 DIAGNOSIS — Z944 Liver transplant status: Principal | ICD-10-CM

## 2021-11-16 DIAGNOSIS — Z796 Long-term use of immunosuppressant medication: Principal | ICD-10-CM

## 2021-11-16 LAB — COMPREHENSIVE METABOLIC PANEL
A/G RATIO: 1.4 (ref 1.2–2.2)
ALBUMIN: 2.9 g/dL — ABNORMAL LOW (ref 3.8–4.8)
ALKALINE PHOSPHATASE: 58 IU/L (ref 44–121)
ALT (SGPT): 29 IU/L (ref 0–32)
AST (SGOT): 28 IU/L (ref 0–40)
BILIRUBIN TOTAL: 0.2 mg/dL (ref 0.0–1.2)
BLOOD UREA NITROGEN: 10 mg/dL (ref 8–27)
BUN / CREAT RATIO: 19 (ref 12–28)
CALCIUM: 8.3 mg/dL — ABNORMAL LOW (ref 8.7–10.3)
CHLORIDE: 106 mmol/L (ref 96–106)
CO2: 24 mmol/L (ref 20–29)
CREATININE: 0.54 mg/dL — ABNORMAL LOW (ref 0.57–1.00)
GLOBULIN, TOTAL: 2.1 g/dL (ref 1.5–4.5)
GLUCOSE: 105 mg/dL — ABNORMAL HIGH (ref 70–99)
POTASSIUM: 4.3 mmol/L (ref 3.5–5.2)
SODIUM: 140 mmol/L (ref 134–144)
TOTAL PROTEIN: 5 g/dL — ABNORMAL LOW (ref 6.0–8.5)

## 2021-11-16 LAB — GAMMA GT: GAMMA GLUTAMYL TRANSFERASE: 41 IU/L (ref 0–60)

## 2021-11-16 LAB — CBC W/ DIFFERENTIAL
BANDED NEUTROPHILS ABSOLUTE COUNT: 0 10*3/uL (ref 0.0–0.1)
BASOPHILS ABSOLUTE COUNT: 0.1 10*3/uL (ref 0.0–0.2)
BASOPHILS RELATIVE PERCENT: 1 %
EOSINOPHILS ABSOLUTE COUNT: 0.3 10*3/uL (ref 0.0–0.4)
EOSINOPHILS RELATIVE PERCENT: 6 %
HEMATOCRIT: 43.2 % (ref 34.0–46.6)
HEMOGLOBIN: 13.6 g/dL (ref 11.1–15.9)
IMMATURE GRANULOCYTES: 0 %
LYMPHOCYTES ABSOLUTE COUNT: 1.4 10*3/uL (ref 0.7–3.1)
LYMPHOCYTES RELATIVE PERCENT: 30 %
MEAN CORPUSCULAR HEMOGLOBIN CONC: 31.5 g/dL (ref 31.5–35.7)
MEAN CORPUSCULAR HEMOGLOBIN: 25.8 pg — ABNORMAL LOW (ref 26.6–33.0)
MEAN CORPUSCULAR VOLUME: 82 fL (ref 79–97)
MONOCYTES ABSOLUTE COUNT: 0.5 10*3/uL (ref 0.1–0.9)
MONOCYTES RELATIVE PERCENT: 10 %
NEUTROPHILS ABSOLUTE COUNT: 2.6 10*3/uL (ref 1.4–7.0)
NEUTROPHILS RELATIVE PERCENT: 53 %
PLATELET COUNT: 387 10*3/uL (ref 150–450)
RED BLOOD CELL COUNT: 5.27 x10E6/uL (ref 3.77–5.28)
RED CELL DISTRIBUTION WIDTH: 14.3 % (ref 11.7–15.4)
WHITE BLOOD CELL COUNT: 4.8 10*3/uL (ref 3.4–10.8)

## 2021-11-16 LAB — PHOSPHORUS: PHOSPHORUS, SERUM: 2.9 mg/dL — ABNORMAL LOW (ref 3.0–4.3)

## 2021-11-16 LAB — BILIRUBIN, DIRECT: BILIRUBIN DIRECT: <0.1 mg/dL (ref 0.00–0.40)

## 2021-11-16 LAB — MAGNESIUM: MAGNESIUM: 1.5 mg/dL — ABNORMAL LOW (ref 1.6–2.3)

## 2021-11-16 LAB — SARS CORONAVIRUS 2 (TAT 6-24 HRS): SARS Coronavirus 2: POSITIVE — AB

## 2021-11-16 MED ORDER — MYFORTIC 180 MG TABLET,DELAYED RELEASE
ORAL_TABLET | Freq: Two times a day (BID) | ORAL | 11 refills | 30 days | Status: CP
Start: 2021-11-16 — End: 2022-11-16

## 2021-11-16 NOTE — Unmapped (Signed)
[facility] Refill Coordination Note    Specialty Medication(s) to be Shipped:   Transplant: Myfortic 180mg     Other medication(s) to be shipped: No additional medications requested for fill at this time     Michelle Travis, DOB: 10/08/1956  Phone: [phone] (home)       All above HIPAA information was verified with patient.     Was a Nurse, learning disability used for this call? No    Completed refill call assessment today to schedule patient's medication shipment from the [facility] [phone]).  All relevant notes have been reviewed.     Specialty medication(s) and dose(s) confirmed: Regimen is correct and unchanged.   Changes to medications: Michelle Travis reports no changes at this time.  Changes to insurance: No  New side effects reported not previously addressed with a pharmacist or physician: None reported  Questions for the pharmacist: No    Confirmed patient received a Conservation officer, historic buildings and a Surveyor, mining with first shipment. The patient will receive a drug information handout for each medication shipped and additional FDA Medication Guides as required.       DISEASE/MEDICATION-SPECIFIC INFORMATION        N/A    SPECIALTY MEDICATION ADHERENCE     Medication Adherence    Patient reported X missed doses in the last month: 0  Specialty Medication: MYFORTIC 180 mg EC tablet  Patient is on additional specialty medications: No  Adherence tools used: patient uses a pill box to manage medications         Myfortic 180mg  8 days worth of medication on hand.        Were doses missed due to medication being on hold? No        REFERRAL TO PHARMACIST     Referral to the pharmacist: Not needed      [facility]     Shipping address confirmed in Epic.     Delivery Scheduled: Yes, Expected medication delivery date: 11/22/21.     Medication will be delivered via UPS to the prescription address in Epic WAM.    Michelle Travis   [facility] Specialty Technician

## 2021-11-16 NOTE — Unmapped (Signed)
Pt request for RX Refill MYFORTIC 180 mg EC tablet

## 2021-11-17 NOTE — Unmapped (Addendum)
Pt stated that she is scheduled for a right hip replacement tomorrow (1/20) at [facility] with Dr. Magnus Ivan. She anticipates an overnight stay and PT follow up over the weekend. Pt will keep this TNC updated on her progress. Reviewed 1/17 stable labs (tac pending) with pt.     Addendum:  Pt notified this TNC that she was found to be covid positive from pre-op testing done yesterday. Pt is asymptomatic, more upset about cancellation of hip surgery. No covid treatment warranted at this time per Dr. Sherryll Burger as pt is asymptomatic. Advised pt to notify TNC if she develops any symptoms.

## 2021-11-18 LAB — TACROLIMUS LEVEL: TACROLIMUS BLOOD: 5.6 ng/mL (ref 2.0–20.0)

## 2021-11-18 LAB — TYPE AND SCREEN
ABO/RH(D): O POS
Antibody Screen: NEGATIVE

## 2021-11-21 DIAGNOSIS — Z940 Kidney transplant status: Principal | ICD-10-CM

## 2021-11-21 DIAGNOSIS — Z944 Liver transplant status: Principal | ICD-10-CM

## 2021-11-21 DIAGNOSIS — Z796 Long-term use of immunosuppressant medication: Principal | ICD-10-CM

## 2021-11-21 MED ORDER — MYCOPHENOLATE SODIUM 180 MG TABLET,DELAYED RELEASE
ORAL_TABLET | Freq: Two times a day (BID) | ORAL | 11 refills | 30 days | Status: CP
Start: 2021-11-21 — End: 2022-11-21
  Filled 2021-11-21: qty 120, 30d supply, fill #0

## 2021-11-21 MED ORDER — MYCOPHENOLATE MOFETIL 500 MG TABLET
ORAL_TABLET | Freq: Two times a day (BID) | ORAL | 11 refills | 30.00000 days | Status: CP
Start: 2021-11-21 — End: 2022-11-21

## 2021-11-26 ENCOUNTER — Other Ambulatory Visit: Payer: Self-pay | Admitting: Physician Assistant

## 2021-12-01 ENCOUNTER — Encounter: Payer: Medicare HMO | Admitting: Orthopaedic Surgery

## 2021-12-02 NOTE — Progress Notes (Addendum)
Savannah Jackson made aware to arrive at 5:45 AM on 12/09/21, reviewed pre op instructions, patient verbalized understanding.  She denied any COVID symptoms after testing positive on 11/16/21.  No other changes to previous medical history, allergies, and medications at this time.

## 2021-12-08 NOTE — H&P (Signed)
TOTAL HIP ADMISSION H&P  Patient is admitted for right total hip arthroplasty.  Subjective:  Chief Complaint: right hip pain  HPI: Savannah Jackson, 66 y.o. female, has a history of pain and functional disability in the right hip(s) due to arthritis and patient has failed non-surgical conservative treatments for greater than 12 weeks to include corticosteriod injections, use of assistive devices, weight reduction as appropriate, and activity modification.  Onset of symptoms was gradual starting 2 years ago with gradually worsening course since that time.The patient noted no past surgery on the right hip(s).  Patient currently rates pain in the right hip at 10 out of 10 with activity. Patient has night pain, worsening of pain with activity and weight bearing, trendelenberg gait, pain that interfers with activities of daily living, and pain with passive range of motion. Patient has evidence of subchondral sclerosis, periarticular osteophytes, and joint space narrowing by imaging studies. This condition presents safety issues increasing the risk of falls.  There is no current active infection.  Patient Active Problem List   Diagnosis Date Noted   Unilateral primary osteoarthritis, right hip 09/26/2021   Immunosuppressed status ([place]) 03/22/2020   Encounter for gynecological examination with Papanicolaou smear of cervix 11/13/2019   Screening for colorectal cancer 11/13/2019   History of colonic polyps 05/17/2018   Aftercare following organ transplant 10/17/2017   Kidney transplanted 09/14/2017   Status post liver transplantation ([place]) 09/14/2017   Acute encephalopathy    Hyperammonemia ([place]) 07/15/2017   ESRD on dialysis [facility])    Rectus sheath hematoma 11/22/2016   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   S/P thoracentesis    Acute renal failure superimposed on stage 4 chronic kidney disease ([place])    Hypoalbuminemia 10/29/2016   Leukocytosis 34/28/7681    Diastolic dysfunction with chronic heart failure ([place]) 10/29/2016   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   Chronic kidney disease (CKD) stage G3b/A3, moderately decreased glomerular filtration rate (GFR) between 30-44 mL/min/1.73 square meter and albuminuria creatinine ratio greater than 300 mg/g (HCC) 08/30/2016   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 08/30/2016   Thickened endometrium 06/19/2016   PMB (postmenopausal bleeding) 06/07/2016   History of breast cancer 06/07/2016   Cough    Esophageal varices ([place]) 15/72/6203   Alcoholic cirrhosis of liver without ascites ([place])    Anemia of chronic disease    Peripheral edema 11/26/2015   Anasarca 55/97/4163   Metabolic acidosis 84/53/6468   UTI (lower urinary tract infection) 09/24/2015   Elevated troponin 09/24/2015   Altered mental status 08/30/2015   Hepatic encephalopathy 08/30/2015   Acute respiratory failure with hypoxia (HCC)    Recurrent right pleural effusion    Alcoholic cirrhosis of liver with ascites (HCC)    Pleural effusion associated with hepatic disorder 07/03/2015   Chronic kidney disease (CKD), stage IV (severe) ([place]) 07/03/2015   Elevated INR 07/03/2015   Acute respiratory distress 07/03/2015   BRBPR (bright red blood per rectum) 04/21/2015   Pleural effusion, right 04/21/2015   Sleep apnea    Chronic back pain    ETOH abuse    Bilateral leg edema    Cirrhosis, alcoholic ([place]) 01/18/2247   Abdominal pain, left lower quadrant 02/17/2013   Diverticulitis of colon without hemorrhage 02/17/2013   Hypertension    Past Medical History:  Diagnosis Date   Bilateral leg edema    Brain aneurysm    Breast disorder    right breast cancer 2002   Cancer [facility])    breast cancer- lumpectromy, ,  chemotherapy, radiation   Chronic back pain Diverticultis   Chronic kidney disease (CKD) stage G3b/A3, moderately decreased glomerular filtration rate (GFR) between 30-44 mL/min/1.73 square meter and albuminuria  creatinine ratio greater than 300 mg/g ([place]) 08/30/2016   Cirrhosis ([place])    alcoholic   DDD (degenerative disc disease), lumbar    Diverticulosis    scope 2014   ETOH abuse    Hepatic encephalopathy 04/23/2017   Hepatomegaly    scope 2014   History of breast cancer 06/07/2016   Hypertension    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   Pneumonia    Rotator cuff syndrome of left shoulder    Sleep apnea    does not wear CPAP   Steal syndrome as complication of dialysis access ([place])    Thickened endometrium 06/19/2016   Will get endo biopsy    Wears glasses     Past Surgical History:  Procedure Laterality Date   AV FISTULA PLACEMENT Left 05/22/2017   Procedure: BRACHIAL ARTERY TO [place];  Surgeon: Elam Dutch, MD;  Location: [place];  Service: Vascular;  Laterality: Left;   [place] Left 08/07/2017   Procedure: BASILIC VEIN TRANSPOSITION SECOND STAGE;  Surgeon: Elam Dutch, MD;  [facility];  Service: Vascular;  Laterality: Left;   BRAIN SURGERY     aneurysm at [place] Right    cancer- lunmmnpectomy-    CATARACT EXTRACTION Bilateral    [facility] 2 or 3 years ago   CHOLECYSTECTOMY     COLONOSCOPY N/A 04/10/2013   Procedure: COLONOSCOPY;  Surgeon: Rogene Houston, MD;  [facility];  Service: Endoscopy;  Laterality: N/A;  1030-rescheduled to 1200 Ann notified pt   COLONOSCOPY N/A 08/07/2018   Procedure: COLONOSCOPY;  Surgeon: Rogene Houston, MD;  [facility];  Service: Endoscopy;  Laterality: N/A;  930   ESOPHAGOGASTRODUODENOSCOPY N/A 04/22/2015   Procedure: ESOPHAGOGASTRODUODENOSCOPY (EGD);  Surgeon: Rogene Houston, MD;  [facility];  Service: Endoscopy;  Laterality: N/A;   EYE SURGERY     IR GENERIC HISTORICAL  07/04/2016   IR RADIOLOGIST EVAL & MGMT 07/04/2016 Jacqulynn Cadet, MD [facility] INTERV RAD    IR RADIOLOGIST EVAL & MGMT  07/24/2017   KIDNEY SURGERY     KNEE ARTHROSCOPY Left    LIVER TRANSPLANT     POLYPECTOMY  08/07/2018   Procedure: POLYPECTOMY;  Surgeon: Rogene Houston, MD;  [facility];  Service: Endoscopy;;  colon    RADIOLOGY WITH ANESTHESIA N/A 07/16/2015   Procedure: TIPS;  Surgeon: Medication Radiologist, MD;  Location: [place];  Service: Radiology;  Laterality: N/A;   REVISON OF ARTERIOVENOUS FISTULA Left 01/06/2019   Procedure: LEFT ARM LIGATION OF ARTERIOVENOUS FISTULA;  Surgeon: Elam Dutch, MD;  [facility];  Service: Vascular;  Laterality: Left;   ROTATOR CUFF REPAIR Left    TOTAL KNEE ARTHROPLASTY Right    right. 2002    No current facility-administered medications for this encounter.   Current Outpatient Medications  Medication Sig Dispense Refill Last Dose   Acetaminophen 325 MG CAPS Take 650 mg by mouth as needed (Headache).      atorvastatin (LIPITOR) 10 MG tablet Take 10 mg by mouth daily.       BELBUCA 300 MCG FILM Take 300 mcg by mouth at bedtime.      cholecalciferol (VITAMIN D) 1000  units tablet Take 1,000 Units by mouth daily.      ENVARSUS XR 1 MG TB24 Take 2 mg by mouth daily before breakfast.      mycophenolate (MYFORTIC) 180 MG EC tablet Take 360 mg by mouth in the morning.      Oxycodone HCl 10 MG TABS Take 10 mg by mouth 4 (four) times daily as needed (pain).      pantoprazole (PROTONIX) 40 MG tablet Take 40 mg by mouth daily.      aspirin EC 81 MG tablet Take 1 tablet (81 mg total) by mouth daily.      Allergies  Allergen Reactions   Latex Swelling and Other (See Comments)    Discoloration of skin.    Vicodin [Hydrocodone-Acetaminophen] Itching    Social History   Tobacco Use   Smoking status: Never   Smokeless tobacco: Never  Substance Use Topics   Alcohol use: No    Alcohol/week: 0.0 standard drinks    Comment: none since 03/2015    Family History  Problem Relation Age of Onset   Aneurysm Mother    Other  Daughter        knee replacement   Hypertension Daughter      Review of Systems  All other systems reviewed and are negative.  Objective:  Physical Exam Vitals reviewed.  Constitutional:      Appearance: Normal appearance.  HENT:     Head: Normocephalic and atraumatic.  Eyes:     Extraocular Movements: Extraocular movements intact.     Pupils: Pupils are equal, round, and reactive to light.  Cardiovascular:     Rate and Rhythm: Normal rate.     Pulses: Normal pulses.  Pulmonary:     Effort: Pulmonary effort is normal.  Abdominal:     Palpations: Abdomen is soft.  Musculoskeletal:     Cervical back: Normal range of motion.     Right hip: Tenderness and bony tenderness present. Decreased range of motion. Decreased strength.  Neurological:     Mental Status: She is alert and oriented to person, place, and time.  Psychiatric:        Behavior: Behavior normal.    Vital signs in last 24 hours:    Labs:   Estimated body mass index is 33.99 kg/m as calculated from the following:   Height as of this encounter: 5\' 4"  (1.626 m).   Weight as of this encounter: 89.8 kg.   Imaging Review Plain radiographs demonstrate severe degenerative joint disease of the right hip(s). The bone quality appears to be good for age and reported activity level.      Assessment/Plan:  End stage arthritis, right hip(s)  The patient history, physical examination, clinical judgement of the provider and imaging studies are consistent with end stage degenerative joint disease of the right hip(s) and total hip arthroplasty is deemed medically necessary. The treatment options including medical management, injection therapy, arthroscopy and arthroplasty were discussed at length. The risks and benefits of total hip arthroplasty were presented and reviewed. The risks due to aseptic loosening, infection, stiffness, dislocation/subluxation,  thromboembolic complications and other imponderables were  discussed.  The patient acknowledged the explanation, agreed to proceed with the plan and consent was signed. Patient is being admitted for inpatient treatment for surgery, pain control, PT, OT, prophylactic antibiotics, VTE prophylaxis, progressive ambulation and ADL's and discharge planning.The patient is planning to be discharged home with home health services

## 2021-12-09 ENCOUNTER — Ambulatory Visit (HOSPITAL_BASED_OUTPATIENT_CLINIC_OR_DEPARTMENT_OTHER): Payer: Medicare HMO | Admitting: Certified Registered Nurse Anesthetist

## 2021-12-09 ENCOUNTER — Other Ambulatory Visit: Payer: Self-pay

## 2021-12-09 ENCOUNTER — Observation Stay (HOSPITAL_COMMUNITY)
Admission: RE | Admit: 2021-12-09 | Discharge: 2021-12-10 | Disposition: A | Discharge status: Home / Self Care | Payer: Medicare HMO | Attending: Orthopaedic Surgery | Admitting: Orthopaedic Surgery

## 2021-12-09 ENCOUNTER — Ambulatory Visit (HOSPITAL_COMMUNITY): Payer: Medicare HMO | Admitting: Certified Registered Nurse Anesthetist

## 2021-12-09 ENCOUNTER — Encounter (HOSPITAL_COMMUNITY)
Admission: RE | Disposition: A | Discharge status: Home / Self Care | Payer: Self-pay | Source: Home / Self Care | Attending: Orthopaedic Surgery

## 2021-12-09 ENCOUNTER — Ambulatory Visit (HOSPITAL_COMMUNITY): Payer: Medicare HMO

## 2021-12-09 ENCOUNTER — Observation Stay (HOSPITAL_COMMUNITY): Payer: Medicare HMO

## 2021-12-09 ENCOUNTER — Encounter (HOSPITAL_COMMUNITY): Payer: Self-pay | Admitting: Orthopaedic Surgery

## 2021-12-09 DIAGNOSIS — M1611 Unilateral primary osteoarthritis, right hip: Principal | ICD-10-CM | POA: Insufficient documentation

## 2021-12-09 DIAGNOSIS — Z992 Dependence on renal dialysis: Secondary | ICD-10-CM | POA: Diagnosis not present

## 2021-12-09 DIAGNOSIS — Z419 Encounter for procedure for purposes other than remedying health state, unspecified: Secondary | ICD-10-CM

## 2021-12-09 DIAGNOSIS — Z96641 Presence of right artificial hip joint: Secondary | ICD-10-CM

## 2021-12-09 DIAGNOSIS — N289 Disorder of kidney and ureter, unspecified: Secondary | ICD-10-CM

## 2021-12-09 DIAGNOSIS — Z96651 Presence of right artificial knee joint: Secondary | ICD-10-CM | POA: Insufficient documentation

## 2021-12-09 DIAGNOSIS — Z7982 Long term (current) use of aspirin: Secondary | ICD-10-CM | POA: Insufficient documentation

## 2021-12-09 DIAGNOSIS — Z91040 Latex allergy status: Secondary | ICD-10-CM | POA: Diagnosis not present

## 2021-12-09 DIAGNOSIS — Z853 Personal history of malignant neoplasm of breast: Secondary | ICD-10-CM | POA: Insufficient documentation

## 2021-12-09 DIAGNOSIS — N186 End stage renal disease: Secondary | ICD-10-CM | POA: Insufficient documentation

## 2021-12-09 DIAGNOSIS — Z01818 Encounter for other preprocedural examination: Secondary | ICD-10-CM

## 2021-12-09 DIAGNOSIS — G4730 Sleep apnea, unspecified: Secondary | ICD-10-CM

## 2021-12-09 DIAGNOSIS — I10 Essential (primary) hypertension: Secondary | ICD-10-CM

## 2021-12-09 DIAGNOSIS — I132 Hypertensive heart and chronic kidney disease with heart failure and with stage 5 chronic kidney disease, or end stage renal disease: Secondary | ICD-10-CM | POA: Diagnosis not present

## 2021-12-09 DIAGNOSIS — I509 Heart failure, unspecified: Secondary | ICD-10-CM | POA: Diagnosis not present

## 2021-12-09 DIAGNOSIS — Z79899 Other long term (current) drug therapy: Secondary | ICD-10-CM | POA: Diagnosis not present

## 2021-12-09 HISTORY — PX: TOTAL HIP ARTHROPLASTY: SHX124

## 2021-12-09 LAB — TYPE AND SCREEN
ABO/RH(D): O POS
Antibody Screen: NEGATIVE

## 2021-12-09 SURGERY — ARTHROPLASTY, HIP, TOTAL, ANTERIOR APPROACH
Anesthesia: Spinal | Site: Hip | Laterality: Right

## 2021-12-09 MED ORDER — HYDROMORPHONE HCL 1 MG/ML IJ SOLN
0.5000 mg | INTRAMUSCULAR | Status: DC | PRN
  Administered 2021-12-09 (×2): 1 mg via INTRAVENOUS
  Filled 2021-12-09 (×2): qty 1

## 2021-12-09 MED ORDER — ORAL CARE MOUTH RINSE: 15.0000 mL | Freq: Once | OROMUCOSAL | Status: AC

## 2021-12-09 MED ORDER — 0.9 % SODIUM CHLORIDE (POUR BTL) OPTIME
TOPICAL | Status: DC | PRN
  Administered 2021-12-09: 1000 mL

## 2021-12-09 MED ORDER — DEXAMETHASONE SODIUM PHOSPHATE 4 MG/ML IJ SOLN
INTRAMUSCULAR | Status: DC | PRN
  Administered 2021-12-09: 10 mg via INTRAVENOUS

## 2021-12-09 MED ORDER — HYDROMORPHONE HCL 1 MG/ML IJ SOLN: 0.2500 mg | INTRAMUSCULAR | Status: DC | PRN

## 2021-12-09 MED ORDER — CEFAZOLIN SODIUM-DEXTROSE 1-4 GM/50ML-% IV SOLN
1.0000 g | Freq: Four times a day (QID) | INTRAVENOUS | Status: AC
  Administered 2021-12-09 (×2): 1 g via INTRAVENOUS
  Filled 2021-12-09 (×2): qty 50

## 2021-12-09 MED ORDER — MIDAZOLAM HCL 5 MG/5ML IJ SOLN
INTRAMUSCULAR | Status: DC | PRN
  Administered 2021-12-09: 2 mg via INTRAVENOUS

## 2021-12-09 MED ORDER — TRANEXAMIC ACID-NACL 1000-0.7 MG/100ML-% IV SOLN
1000.0000 mg | INTRAVENOUS | Status: AC
  Administered 2021-12-09: 1000 mg via INTRAVENOUS
  Filled 2021-12-09: qty 100

## 2021-12-09 MED ORDER — MENTHOL 3 MG MT LOZG: 1.0000 | LOZENGE | OROMUCOSAL | Status: DC | PRN

## 2021-12-09 MED ORDER — ACETAMINOPHEN 325 MG PO TABS
325.0000 mg | ORAL_TABLET | Freq: Four times a day (QID) | ORAL | Status: DC | PRN
  Administered 2021-12-09: 650 mg via ORAL
  Filled 2021-12-09: qty 2

## 2021-12-09 MED ORDER — ATORVASTATIN CALCIUM 10 MG PO TABS
10.0000 mg | ORAL_TABLET | Freq: Every day | ORAL | Status: DC
Start: 2021-12-09 — End: 2021-12-10
  Administered 2021-12-09 – 2021-12-10 (×2): 10 mg via ORAL
  Filled 2021-12-09 (×2): qty 1

## 2021-12-09 MED ORDER — PROMETHAZINE HCL 25 MG/ML IJ SOLN: 6.2500 mg | INTRAMUSCULAR | Status: DC | PRN

## 2021-12-09 MED ORDER — EPHEDRINE SULFATE (PRESSORS) 50 MG/ML IJ SOLN
INTRAMUSCULAR | Status: DC | PRN
  Administered 2021-12-09: 10 mg via INTRAVENOUS
  Administered 2021-12-09: 5 mg via INTRAVENOUS

## 2021-12-09 MED ORDER — VITAMIN D 25 MCG (1000 UNIT) PO TABS
1000.0000 [IU] | ORAL_TABLET | Freq: Every day | ORAL | Status: DC
  Administered 2021-12-09 – 2021-12-10 (×2): 1000 [IU] via ORAL
  Filled 2021-12-09 (×2): qty 1

## 2021-12-09 MED ORDER — OXYCODONE HCL 5 MG PO TABS
5.0000 mg | ORAL_TABLET | ORAL | Status: DC | PRN
  Administered 2021-12-10: 10 mg via ORAL
  Filled 2021-12-09 (×3): qty 2

## 2021-12-09 MED ORDER — CHLORHEXIDINE GLUCONATE 0.12 % MT SOLN
15.0000 mL | Freq: Once | OROMUCOSAL | Status: AC
  Administered 2021-12-09: 15 mL via OROMUCOSAL

## 2021-12-09 MED ORDER — SODIUM CHLORIDE 0.9 % IR SOLN
Status: DC | PRN
  Administered 2021-12-09: 1000 mL

## 2021-12-09 MED ORDER — OXYCODONE HCL 5 MG/5ML PO SOLN: 5.0000 mg | Freq: Once | ORAL | Status: AC | PRN

## 2021-12-09 MED ORDER — MYCOPHENOLATE SODIUM 180 MG PO TBEC
360.0000 mg | DELAYED_RELEASE_TABLET | Freq: Every day | ORAL | Status: DC
  Administered 2021-12-10: 360 mg via ORAL
  Filled 2021-12-09: qty 2

## 2021-12-09 MED ORDER — MIDAZOLAM HCL 2 MG/2ML IJ SOLN
INTRAMUSCULAR | Status: AC
  Filled 2021-12-09: qty 2

## 2021-12-09 MED ORDER — ASPIRIN 81 MG PO CHEW
81.0000 mg | CHEWABLE_TABLET | Freq: Two times a day (BID) | ORAL | Status: DC
  Administered 2021-12-09 – 2021-12-10 (×2): 81 mg via ORAL
  Filled 2021-12-09 (×2): qty 1

## 2021-12-09 MED ORDER — SODIUM CHLORIDE 0.9 % IV SOLN: INTRAVENOUS | Status: DC

## 2021-12-09 MED ORDER — PANTOPRAZOLE SODIUM 40 MG PO TBEC
40.0000 mg | DELAYED_RELEASE_TABLET | Freq: Every day | ORAL | Status: DC
  Administered 2021-12-10: 40 mg via ORAL
  Filled 2021-12-09: qty 1

## 2021-12-09 MED ORDER — FENTANYL CITRATE (PF) 100 MCG/2ML IJ SOLN
INTRAMUSCULAR | Status: DC | PRN
Start: 2021-12-09 — End: 2021-12-09
  Administered 2021-12-09: 100 ug via INTRAVENOUS

## 2021-12-09 MED ORDER — FENTANYL CITRATE (PF) 100 MCG/2ML IJ SOLN
INTRAMUSCULAR | Status: AC
  Filled 2021-12-09: qty 2

## 2021-12-09 MED ORDER — METOCLOPRAMIDE HCL 5 MG PO TABS: 5.0000 mg | ORAL_TABLET | Freq: Three times a day (TID) | ORAL | Status: DC | PRN

## 2021-12-09 MED ORDER — PHENYLEPHRINE HCL (PRESSORS) 10 MG/ML IV SOLN
INTRAVENOUS | Status: AC
  Filled 2021-12-09: qty 1

## 2021-12-09 MED ORDER — PANTOPRAZOLE SODIUM 40 MG PO TBEC: 40.0000 mg | DELAYED_RELEASE_TABLET | Freq: Every day | ORAL | Status: DC

## 2021-12-09 MED ORDER — LACTATED RINGERS IV SOLN: INTRAVENOUS | Status: DC

## 2021-12-09 MED ORDER — OXYCODONE HCL 5 MG PO TABS
10.0000 mg | ORAL_TABLET | ORAL | Status: DC | PRN
  Administered 2021-12-09 (×2): 15 mg via ORAL
  Administered 2021-12-10 (×2): 10 mg via ORAL
  Filled 2021-12-09 (×2): qty 3

## 2021-12-09 MED ORDER — METHOCARBAMOL 500 MG PO TABS
500.0000 mg | ORAL_TABLET | Freq: Four times a day (QID) | ORAL | Status: DC | PRN
  Administered 2021-12-09 – 2021-12-10 (×4): 500 mg via ORAL
  Filled 2021-12-09 (×4): qty 1

## 2021-12-09 MED ORDER — OXYCODONE HCL 5 MG PO TABS
ORAL_TABLET | ORAL | Status: AC
  Filled 2021-12-09: qty 1

## 2021-12-09 MED ORDER — PROPOFOL 500 MG/50ML IV EMUL
INTRAVENOUS | Status: DC | PRN
  Administered 2021-12-09: 75 ug/kg/min via INTRAVENOUS

## 2021-12-09 MED ORDER — PHENYLEPHRINE HCL (PRESSORS) 10 MG/ML IV SOLN
INTRAVENOUS | Status: DC | PRN
  Administered 2021-12-09 (×4): 80 ug via INTRAVENOUS

## 2021-12-09 MED ORDER — PHENOL 1.4 % MT LIQD: 1.0000 | OROMUCOSAL | Status: DC | PRN

## 2021-12-09 MED ORDER — ALUM & MAG HYDROXIDE-SIMETH 200-200-20 MG/5ML PO SUSP: 30.0000 mL | ORAL | Status: DC | PRN

## 2021-12-09 MED ORDER — METOCLOPRAMIDE HCL 5 MG/ML IJ SOLN: 5.0000 mg | Freq: Three times a day (TID) | INTRAMUSCULAR | Status: DC | PRN

## 2021-12-09 MED ORDER — TACROLIMUS ER 1 MG PO TB24
2.0000 mg | ORAL_TABLET | Freq: Every day | ORAL | Status: DC
  Administered 2021-12-10: 2 mg via ORAL
  Filled 2021-12-09: qty 2

## 2021-12-09 MED ORDER — CEFAZOLIN SODIUM-DEXTROSE 2-4 GM/100ML-% IV SOLN
2.0000 g | INTRAVENOUS | Status: AC
  Administered 2021-12-09: 2 g via INTRAVENOUS
  Filled 2021-12-09: qty 100

## 2021-12-09 MED ORDER — DIPHENHYDRAMINE HCL 12.5 MG/5ML PO ELIX: 12.5000 mg | ORAL_SOLUTION | ORAL | Status: DC | PRN

## 2021-12-09 MED ORDER — DEXTROSE 5 % IV SOLN
500.0000 mg | Freq: Four times a day (QID) | INTRAVENOUS | Status: DC | PRN
  Filled 2021-12-09: qty 5

## 2021-12-09 MED ORDER — ONDANSETRON HCL 4 MG/2ML IJ SOLN
4.0000 mg | Freq: Four times a day (QID) | INTRAMUSCULAR | Status: DC | PRN
  Administered 2021-12-09: 4 mg via INTRAVENOUS
  Filled 2021-12-09: qty 2

## 2021-12-09 MED ORDER — OXYCODONE HCL 5 MG PO TABS
5.0000 mg | ORAL_TABLET | Freq: Once | ORAL | Status: AC | PRN
  Administered 2021-12-09: 5 mg via ORAL

## 2021-12-09 MED ORDER — ONDANSETRON HCL 4 MG PO TABS: 4.0000 mg | ORAL_TABLET | Freq: Four times a day (QID) | ORAL | Status: DC | PRN

## 2021-12-09 MED ORDER — ONDANSETRON HCL 4 MG/2ML IJ SOLN
INTRAMUSCULAR | Status: DC | PRN
  Administered 2021-12-09: 4 mg via INTRAVENOUS

## 2021-12-09 MED ORDER — POVIDONE-IODINE 10 % EX SWAB
2.0000 "application " | Freq: Once | CUTANEOUS | Status: AC
  Administered 2021-12-09: 2 via TOPICAL

## 2021-12-09 MED ORDER — DOCUSATE SODIUM 100 MG PO CAPS
100.0000 mg | ORAL_CAPSULE | Freq: Two times a day (BID) | ORAL | Status: DC
  Administered 2021-12-09 – 2021-12-10 (×2): 100 mg via ORAL
  Filled 2021-12-09 (×2): qty 1

## 2021-12-09 SURGICAL SUPPLY — 45 items
APL SKNCLS STERI-STRIP NONHPOA (GAUZE/BANDAGES/DRESSINGS)
BAG COUNTER SPONGE SURGICOUNT (BAG) ×3 IMPLANT
BAG SPEC THK2 15X12 ZIP CLS (MISCELLANEOUS)
BAG SPNG CNTER NS LX DISP (BAG) ×1
BAG ZIPLOCK 12X15 (MISCELLANEOUS) IMPLANT
BENZOIN TINCTURE PRP APPL 2/3 (GAUZE/BANDAGES/DRESSINGS) IMPLANT
BLADE SAW SGTL 18X1.27X75 (BLADE) ×3 IMPLANT
COVER PERINEAL POST (MISCELLANEOUS) ×3 IMPLANT
COVER SURGICAL LIGHT HANDLE (MISCELLANEOUS) ×3 IMPLANT
CUP ACET PINNACLE SECTR 48MM (Joint) IMPLANT
DRAPE FOOT SWITCH (DRAPES) ×3 IMPLANT
DRAPE STERI IOBAN 125X83 (DRAPES) ×3 IMPLANT
DRAPE U-SHAPE 47X51 STRL (DRAPES) ×6 IMPLANT
DRSG AQUACEL AG ADV 3.5X10 (GAUZE/BANDAGES/DRESSINGS) ×3 IMPLANT
DURAPREP 26ML APPLICATOR (WOUND CARE) ×3 IMPLANT
ELECT REM PT RETURN 15FT ADLT (MISCELLANEOUS) ×3 IMPLANT
GAUZE XEROFORM 1X8 LF (GAUZE/BANDAGES/DRESSINGS) ×3 IMPLANT
GLOVE SRG 8 PF TXTR STRL LF DI (GLOVE) ×4 IMPLANT
GLOVE SURG ENC MOIS LTX SZ7.5 (GLOVE) ×3 IMPLANT
GLOVE SURG NEOPR MICRO LF SZ8 (GLOVE) ×3 IMPLANT
GLOVE SURG UNDER POLY LF SZ8 (GLOVE) ×4
GOWN STRL REUS W/TWL XL LVL3 (GOWN DISPOSABLE) ×6 IMPLANT
HANDPIECE INTERPULSE COAX TIP (DISPOSABLE) ×2
HEAD FEM STD 32X+1 STRL (Hips) ×1 IMPLANT
HOLDER FOLEY CATH W/STRAP (MISCELLANEOUS) ×3 IMPLANT
KIT TURNOVER KIT A (KITS) IMPLANT
LINER ACET 32X48 (Liner) ×1 IMPLANT
PACK ANTERIOR HIP CUSTOM (KITS) ×3 IMPLANT
PINNSECTOR W/GRIP ACE CUP 48MM (Joint) ×2 IMPLANT
SET HNDPC FAN SPRY TIP SCT (DISPOSABLE) ×2 IMPLANT
SPONGE T-LAP 18X18 ~~LOC~~+RFID (SPONGE) ×9 IMPLANT
STAPLER VISISTAT 35W (STAPLE) IMPLANT
STEM FEM ACTIS HIGH SZ0 (Stem) ×1 IMPLANT
STRIP CLOSURE SKIN 1/2X4 (GAUZE/BANDAGES/DRESSINGS) IMPLANT
SUT ETHIBOND NAB CT1 #1 30IN (SUTURE) ×3 IMPLANT
SUT ETHILON 2 0 PS N (SUTURE) IMPLANT
SUT MNCRL AB 4-0 PS2 18 (SUTURE) IMPLANT
SUT VIC AB 0 CT1 36 (SUTURE) ×3 IMPLANT
SUT VIC AB 1 CT1 36 (SUTURE) ×3 IMPLANT
SUT VIC AB 2-0 CT1 27 (SUTURE) ×4
SUT VIC AB 2-0 CT1 TAPERPNT 27 (SUTURE) ×4 IMPLANT
TRAY FOL W/BAG SLVR 16FR STRL (SET/KITS/TRAYS/PACK) IMPLANT
TRAY FOLEY MTR SLVR 16FR STAT (SET/KITS/TRAYS/PACK) IMPLANT
TRAY FOLEY W/BAG SLVR 16FR LF (SET/KITS/TRAYS/PACK) ×2
YANKAUER SUCT BULB TIP NO VENT (SUCTIONS) ×3 IMPLANT

## 2021-12-09 NOTE — Plan of Care (Signed)

## 2021-12-09 NOTE — Anesthesia Postprocedure Evaluation (Signed)
Anesthesia Post Note  Patient: Savannah Jackson  Procedure(s) Performed: RIGHT TOTAL HIP ARTHROPLASTY ANTERIOR APPROACH (Right: Hip)     Patient location during evaluation: PACU Anesthesia Type: Spinal Level of consciousness: awake and alert Pain management: pain level controlled Vital Signs Assessment: post-procedure vital signs reviewed and stable Respiratory status: spontaneous breathing, nonlabored ventilation and respiratory function stable Cardiovascular status: blood pressure returned to baseline and stable Postop Assessment: no apparent nausea or vomiting Anesthetic complications: no   No notable events documented.  Last Vitals:  Vitals:   12/09/21 1115 12/09/21 1130  BP: 139/82 (!) 149/81  Pulse: (!) 59 64  Resp: 16 15  Temp:  36.5 C  SpO2: 94% 94%    Last Pain:  Vitals:   12/09/21 1130  TempSrc:   PainSc: 0-No pain    LLE Motor Response: Purposeful movement (12/09/21 1130) LLE Sensation: Increased (12/09/21 1130) RLE Motor Response: Purposeful movement (12/09/21 1130) RLE Sensation: Increased (12/09/21 1130) L Sensory Level: S1-Sole of foot, small toes (12/09/21 1130) R Sensory Level: S1-Sole of foot, small toes (12/09/21 1130)  Lynda Rainwater

## 2021-12-09 NOTE — Evaluation (Signed)
Physical Therapy Evaluation Patient Details Name: Savannah Jackson MRN: [id number] DOB: 1956/07/06 Today's Date: 12/09/2021  History of Present Illness  66 y.o. female admitted 12/09/21 for R AA-THA. PMH includes: kidney transplant, ESRD on dialysis, CHF, cirrhosis, EtOH.  Clinical Impression  Pt is s/p THA resulting in the deficits listed below (see PT Problem List). Pt ambulated 77' with RW, no loss of balance. Initiated THA HEP. Good progress expected.  Pt will benefit from skilled PT to increase their independence and safety with mobility to allow discharge to the venue listed below.         Recommendations for follow up therapy are one component of a multi-disciplinary discharge planning process, led by the attending physician.  Recommendations may be updated based on patient status, additional functional criteria and insurance authorization.  Follow Up Recommendations Home health PT    Assistance Recommended at Discharge Intermittent Supervision/Assistance  Patient can return home with the following  Assistance with cooking/housework;A little help with bathing/dressing/bathroom;Help with stairs or ramp for entrance;Assist for transportation    Equipment Recommendations Rolling walker (2 wheels);BSC/3in1  Recommendations for Other Services       Functional Status Assessment Patient has had a recent decline in their functional status and demonstrates the ability to make significant improvements in function in a reasonable and predictable amount of time.     Precautions / Restrictions Precautions Precautions: Fall Precaution Comments: denies falls in past 6 months Restrictions Weight Bearing Restrictions: No RLE Weight Bearing: Weight bearing as tolerated      Mobility  Bed Mobility Overal bed mobility: Modified Independent             General bed mobility comments: used bedrail, HOB up    Transfers Overall transfer level: Needs assistance Equipment used: Rolling  walker (2 wheels) Transfers: Sit to/from Stand Sit to Stand: Min assist           General transfer comment: VCs hand placement    Ambulation/Gait Ambulation/Gait assistance: Min guard Gait Distance (Feet): 60 Feet Assistive device: Rolling walker (2 wheels) Gait Pattern/deviations: Step-to pattern Gait velocity: decr     General Gait Details: VCs sequencing, no loss of balance  Stairs            Wheelchair Mobility    Modified Rankin (Stroke Patients Only)       Balance Overall balance assessment: Modified Independent                                           Pertinent Vitals/Pain Pain Assessment Pain Assessment: 0-10 Pain Score: 5  Pain Location: R hip with walking Pain Descriptors / Indicators: Sore Pain Intervention(s): Limited activity within patient's tolerance, Monitored during session, Premedicated before session, Ice applied    Home Living Family/patient expects to be discharged to:: Private residence Living Arrangements: Other relatives Available Help at Discharge: Family;Available 24 hours/day   Home Access: Ramped entrance       Home Layout: One level Home Equipment: Cane - single point;Grab bars - tub/shower Additional Comments: walked with [facility]    Prior Function Prior Level of Function : Independent/Modified Independent             Mobility Comments: used SPC ADLs Comments: independent     Hand Dominance        Extremity/Trunk Assessment   Upper Extremity Assessment Upper Extremity Assessment: Overall WFL for tasks assessed  Lower Extremity Assessment Lower Extremity Assessment: RLE deficits/detail RLE Deficits / Details: hip +2/5, knee ext at least 3/5 RLE Sensation: WNL RLE Coordination: WNL    Cervical / Trunk Assessment Cervical / Trunk Assessment: Normal  Communication   Communication: No difficulties  Cognition Arousal/Alertness: Awake/alert Behavior During Therapy: WFL for tasks  assessed/performed Overall Cognitive Status: Within Functional Limits for tasks assessed                                          General Comments      Exercises Total Joint Exercises Ankle Circles/Pumps: AROM, Both, 10 reps, Supine Heel Slides: AAROM, Right, 10 reps, Supine Hip ABduction/ADduction: AAROM, Right, 5 reps, Supine Long Arc Quad: AROM, Right, 5 reps, Seated   Assessment/Plan    PT Assessment Patient needs continued PT services  PT Problem List Decreased strength;Decreased mobility;Decreased activity tolerance;Decreased balance;Pain       PT Treatment Interventions DME instruction;Therapeutic activities;Therapeutic exercise;Gait training;Patient/family education    PT Goals (Current goals can be found in the Care Plan section)  Acute Rehab PT Goals Patient Stated Goal: walk, go home PT Goal Formulation: With patient Time For Goal Achievement: 12/16/21 Potential to Achieve Goals: Good    Frequency 7X/week     Co-evaluation               AM-PAC PT "6 Clicks" Mobility  Outcome Measure Help needed turning from your back to your side while in a flat bed without using bedrails?: A Little Help needed moving from lying on your back to sitting on the side of a flat bed without using bedrails?: A Little Help needed moving to and from a bed to a chair (including a wheelchair)?: A Little Help needed standing up from a chair using your arms (e.g., wheelchair or bedside chair)?: A Little Help needed to walk in hospital room?: A Little Help needed climbing 3-5 steps with a railing? : A Lot 6 Click Score: 17    End of Session Equipment Utilized During Treatment: Gait belt Activity Tolerance: Patient tolerated treatment well Patient left: in chair;with call bell/phone within reach (chair alarm pad under pt, no alarm box in room, NT notified) Nurse Communication: Mobility status PT Visit Diagnosis: Difficulty in walking, not elsewhere classified  (R26.2);Pain Pain - Right/Left: Right Pain - part of body: Hip    Time: 1359-1417 PT Time Calculation (min) (ACUTE ONLY): 18 min   Charges:   PT Evaluation $PT Eval Moderate Complexity: 1 Mod         Philomena Doheny PT 12/09/2021  Acute Rehabilitation Services Pager [phone] Office [phone]

## 2021-12-09 NOTE — Transfer of Care (Signed)
Immediate Anesthesia Transfer of Care Note  Patient: Savannah Jackson  Procedure(s) Performed: RIGHT TOTAL HIP ARTHROPLASTY ANTERIOR APPROACH (Right: Hip)  Patient Location: PACU  Anesthesia Type:Spinal  Level of Consciousness: awake, alert  and oriented  Airway & Oxygen Therapy: Patient Spontanous Breathing and Patient connected to face mask oxygen  Post-op Assessment: Report given to RN, Post -op Vital signs reviewed and stable and 1 dose of phenylephrine given for low BP in PACU, pt mentating without difficutly, MDA notified.  Post vital signs: Reviewed and stable  Last Vitals:  Vitals Value Taken Time  BP    Temp    Pulse    Resp    SpO2      Last Pain:  Vitals:   12/09/21 0702  TempSrc:   PainSc: 0-No pain      Patients Stated Pain Goal: 3 (25/49/82 6415)  Complications: No notable events documented.

## 2021-12-09 NOTE — Brief Op Note (Signed)
12/09/2021  9:42 AM  PATIENT:  Savannah Jackson  66 y.o. female  PRE-OPERATIVE DIAGNOSIS:  osteoarthritis right hip  POST-OPERATIVE DIAGNOSIS:  osteoarthritis right hip  PROCEDURE:  Procedure(s): RIGHT TOTAL HIP ARTHROPLASTY ANTERIOR APPROACH (Right)  SURGEON:  Surgeon(s) and Role:    Mcarthur Rossetti, MD - Primary  PHYSICIAN ASSISTANT:  Benita Stabile, PA-C   ANESTHESIA:   spinal  EBL:  150 mL   COUNTS:  YES  DICTATION: .Other Dictation: Dictation Number [id number]  PLAN OF CARE: Admit for overnight observation  PATIENT DISPOSITION:  PACU - hemodynamically stable.   Delay start of Pharmacological VTE agent (>24hrs) due to surgical blood loss or risk of bleeding: no

## 2021-12-09 NOTE — Interval H&P Note (Signed)
History and Physical Interval Note: The patient understands fully that she is here today for a right total hip replacement to treat her right hip osteoarthritis.  There has been no acute or interval change in her medical status.  Please see recent H&P.  The risks and benefits of surgery been explained in detail and informed consent is obtained.  The right hip has been marked.  12/09/2021 6:53 AM  Savannah Jackson  has presented today for surgery, with the diagnosis of osteoarthritis right hip.  The various methods of treatment have been discussed with the patient and family. After consideration of risks, benefits and other options for treatment, the patient has consented to  Procedure(s): RIGHT TOTAL HIP ARTHROPLASTY ANTERIOR APPROACH (Right) as a surgical intervention.  The patient's history has been reviewed, patient examined, no change in status, stable for surgery.  I have reviewed the patient's chart and labs.  Questions were answered to the patient's satisfaction.     Mcarthur Rossetti

## 2021-12-09 NOTE — TOC Transition Note (Signed)
Transition of Care [facility]) - CM/SW Discharge Note   Patient Details  Name: Savannah Jackson MRN: [id number] Date of Birth: 02-18-1956  [facility] CM/SW Contact:  Lennart Pall, LCSW Phone Number: 12/09/2021, 12:57 PM   Clinical Narrative:    Met with pt today who confirms need for rw and 3n1 and no agency preference - referral placed with [facility] for delivery to room.  Pt aware she has been prearranged for HHPT with [place].  No further TOC needs.   Final next level of care: [place] Barriers to Discharge: No Barriers Identified   Patient Goals and CMS Choice Patient states their goals for this hospitalization and ongoing recovery are:: return home      Discharge Placement                       Discharge Plan and Services                DME Arranged: 3-N-1, Walker rolling DME Agency: [employer] Date DME Agency Contacted: 12/09/21 Time DME Agency Contacted: [id number] Representative spoke with at DME Agency: [place]: PT Jeff: [place] Determinants of Health (SDOH) Interventions     Readmission Risk Interventions No flowsheet data found.

## 2021-12-09 NOTE — Anesthesia Procedure Notes (Signed)
Spinal  Start time: 12/09/2021 8:34 AM End time: 12/09/2021 8:39 AM Staffing Performed: resident/CRNA  Resident/CRNA: Gean Maidens, CRNA Preanesthetic Checklist Completed: patient identified, IV checked, site marked, risks and benefits discussed, surgical consent, monitors and equipment checked, pre-op evaluation and timeout performed Spinal Block Patient position: sitting Prep: DuraPrep Patient monitoring: heart rate, continuous pulse ox and blood pressure Approach: midline Location: L4-5 Injection technique: single-shot Needle Needle type: Pencan  Needle gauge: 24 G Needle length: 9 cm Needle insertion depth: 8 cm Additional Notes Pt sitting position, sterile prep and drape, negative paresthesia/heme

## 2021-12-09 NOTE — Op Note (Signed)
NAME: Savannah Jackson, ABBETT MEDICAL RECORD NO: [id number] ACCOUNT NO: [id number] DATE OF BIRTH: September 25, 1956 FACILITY: [facility]: [id number] PHYSICIAN: Lind Guest. Ninfa Linden, MD  Operative Report   DATE OF PROCEDURE: 12/09/2021  PREOPERATIVE DIAGNOSES:  Right hip osteoarthritis.  POSTOPERATIVE DIAGNOSES:  Right hip osteoarthritis.  PROCEDURE:  Right total hip arthroplasty through direct anterior approach.  IMPLANTS:  DePuy sector Gription acetabular component size 48, size 32+0 neutral polyethylene liner, size 0 ACTIS femoral component with high offset, size 32+1 metal hip ball.  SURGEON: Lind Guest. Ninfa Linden, MD.  ASSISTANT:  Benita Stabile, PA-C.  ANESTHESIA:  Spinal.  ANTIBIOTICS:  2 g IV Ancef.  ESTIMATED BLOOD LOSS:  150 mL.  COMPLICATIONS:  None.  INDICATIONS:  The patient is a 66 year old active female with debilitating arthritis involving her right hip.  This has been well documented with clinical exam and x-rays.  At this point, she has tried and failed all forms of conservative treatment and  her right hip is daily and it is detrimentally affecting her mobility, her quality of life, and her activities of daily living to the point she does wish to proceed with a right total hip arthroplasty and we have recommended that as well.  We did talk in  length and detail about the risk of acute blood loss anemia, nerve or vessel injury, fracture, infection, dislocation, DVT, implant failure, leg length differences and skin and soft tissue issues.  We talked about our goals being decreased pain, improve  mobility and overall improve quality of life.  DESCRIPTION OF PROCEDURE:  After informed consent was obtained, appropriate right hip was marked.  She was brought to the operating room and sat up on the stretcher where spinal anesthesia was obtained.  She was laid in supine position on the stretcher.   Foley catheter was placed and traction boots were placed on both her feet.  Next, she  was placed supine on the Hana fracture table.  The perineal post in place and both legs in line skeletal traction devices and no traction applied.  Her right operative  hip was prepped and draped with DuraPrep and sterile drapes.  A timeout was called.  She was identified correct patient, correct right hip.  I then made an incision just inferior and posterior to the anterior superior iliac spine and carried this  obliquely down the leg.  We dissected down to tensor fascia lata muscle.  Tensor fascia was then divided longitudinally to proceed with direct anterior approach to the hip.  We identified and cauterized circumflex vessels and identified the hip capsule,  and opened up the hip capsule in L-type format, finding moderate joint effusion and significant osteoarthritis around the lateral femoral head and neck.  We placed curved retractor around the medial and lateral femoral neck and made a femoral neck cut  with an oscillating saw and completed this on an osteotome.  We placed a corkscrew guide in the femoral head and removed from its entirety and found a wide area devoid of cartilage.  It was a very small femoral head.  I then placed a bent Hohmann over  the medial acetabular rim and removed remnants of acetabular labrum and other debris.  We then began reaming in a stepwise increments from 43 reamer going up to 47 reamer with all reamers placed under direct visualization.  The last reamer was placed  under direct fluoroscopy, so we could then obtain our depth of reaming, our inclination and anteversion.  I then placed real DePuy sector  Gription acetabular component size 48 and a 32+0 neutral polyethylene liner for that size acetabular component.   Attention was then turned to the femur.  With the leg externally rotated to 120 degrees, extended, and, adducted, we were able to place a Mueller retractor medially and Hohman retractor behind the greater trochanter, we released lateral joint capsule and   used a box cutting osteotome to enter femoral canal and a rongeur to lateralize.  We may began broaching using the ACTIS broaching system going up to only a size 0 because of a very tight canal.  With a size 0 in place, we trialed a high offset femoral  neck based on her anatomy and a 32+1 hip ball, reduced this in the acetabulum.  We were pleased with leg length, offset, range of motion and stability assessed mechanically and radiographically.  We then dislocated the hip and removed the trial  components.  We placed the real ACTIS femoral component with high offset size 0 and the real 32+1 metal hip ball and again reduced this in acetabulum.  We were pleased with stability and again we assessed this radiographically and mechanically.  We then  irrigated the soft tissue with normal saline solution using pulsatile lavage.  We closed the joint capsule with #1 Ethibond suture, followed by #1 Vicryl to close the tensor fascia, 0 Vicryl was used to close deep tissue and 2-0 Vicryl was used to close  subcutaneous tissue.  The skin was closed with staples.  An Aquacel dressing was applied.  She was taken off the Hana table and taken to recovery room in stable condition with all final counts being correct.  There are no complications noted.   Of note, Erskine Emery, PA-C did assist in entire case and his assistance was crucial for facilitating all aspects of this case.   CHR D: 12/09/2021 9:36:50 am T: 12/09/2021 12:23:00 pm  JOB: [id number]/ [id number]

## 2021-12-09 NOTE — Anesthesia Preprocedure Evaluation (Signed)
Anesthesia Evaluation  Patient identified by MRN, date of birth, ID band Patient awake    Reviewed: Allergy & Precautions, NPO status , Patient's Chart, lab work & pertinent test results  Airway Mallampati: III  TM Distance: >3 FB Neck ROM: Full    Dental no notable dental hx.    Pulmonary sleep apnea ,    Pulmonary exam normal breath sounds clear to auscultation       Cardiovascular hypertension, Pt. on medications Normal cardiovascular exam Rhythm:Regular Rate:Normal     Neuro/Psych negative neurological ROS     GI/Hepatic negative GI ROS, (+) Cirrhosis       ,   Endo/Other  negative endocrine ROS  Renal/GU Renal InsufficiencyRenal disease     Musculoskeletal  (+) Arthritis , Osteoarthritis,    Abdominal (+) + obese,   Peds  Hematology negative hematology ROS (+)   Anesthesia Other Findings   Reproductive/Obstetrics                             Lab Results  Component Value Date   WBC 6.4 11/11/2021   HGB 13.5 11/11/2021   HCT 44.9 11/11/2021   MCV 87.9 11/11/2021   PLT 341 11/11/2021   Lab Results  Component Value Date   CREATININE 0.54 11/11/2021   BUN 12 11/11/2021   NA 138 11/11/2021   K 5.2 (H) 11/11/2021   CL 106 11/11/2021   CO2 26 11/11/2021    Anesthesia Physical  Anesthesia Plan  ASA: III  Anesthesia Plan: Spinal   Post-op Pain Management:    Induction: Intravenous  PONV Risk Score and Plan: 2 and Ondansetron, Treatment may vary due to age or medical condition and Midazolam  Airway Management Planned: Simple Face Mask  Additional Equipment:   Intra-op Plan:   Post-operative Plan:   Informed Consent: I have reviewed the patients History and Physical, chart, labs and discussed the procedure including the risks, benefits and alternatives for the proposed anesthesia with the patient or authorized representative who has indicated his/her understanding  and acceptance.       Plan Discussed with: CRNA  Anesthesia Plan Comments:         Anesthesia Quick Evaluation

## 2021-12-10 DIAGNOSIS — M1611 Unilateral primary osteoarthritis, right hip: Secondary | ICD-10-CM | POA: Diagnosis not present

## 2021-12-10 LAB — CBC
HCT: 42.3 % (ref 36.0–46.0)
Hemoglobin: 13 g/dL (ref 12.0–15.0)
MCH: 26.6 pg (ref 26.0–34.0)
MCHC: 30.7 g/dL (ref 30.0–36.0)
MCV: 86.5 fL (ref 80.0–100.0)
Platelets: 330 10*3/uL (ref 150–400)
RBC: 4.89 MIL/uL (ref 3.87–5.11)
RDW: 14.5 % (ref 11.5–15.5)
WBC: 11.7 10*3/uL — ABNORMAL HIGH (ref 4.0–10.5)
nRBC: 0 % (ref 0.0–0.2)

## 2021-12-10 LAB — BASIC METABOLIC PANEL
Anion gap: 6 (ref 5–15)
BUN: 13 mg/dL (ref 8–23)
CO2: 26 mmol/L (ref 22–32)
Calcium: 8 mg/dL — ABNORMAL LOW (ref 8.9–10.3)
Chloride: 107 mmol/L (ref 98–111)
Creatinine, Ser: 0.54 mg/dL (ref 0.44–1.00)
GFR, Estimated: >60 mL/min (ref >60)
Glucose, Bld: 153 mg/dL — ABNORMAL HIGH (ref 70–99)
Potassium: 4.2 mmol/L (ref 3.5–5.1)
Sodium: 139 mmol/L (ref 135–145)

## 2021-12-10 MED ORDER — OXYCODONE HCL 5 MG PO TABS: 5.0000 mg | ORAL_TABLET | ORAL | 0 refills | Status: AC | PRN

## 2021-12-10 MED ORDER — ASPIRIN 81 MG PO CHEW: 81.0000 mg | CHEWABLE_TABLET | Freq: Two times a day (BID) | ORAL | 0 refills | Status: AC

## 2021-12-10 MED ORDER — METHOCARBAMOL 500 MG PO TABS: 500.0000 mg | ORAL_TABLET | Freq: Four times a day (QID) | ORAL | 1 refills | Status: AC | PRN

## 2021-12-10 NOTE — Progress Notes (Signed)
Physical Therapy Treatment Patient Details Name: Savannah Jackson MRN: [id number] DOB: 1956-08-05 Today's Date: 12/10/2021   History of Present Illness 66 y.o. female admitted 12/09/21 for R AA-THA. PMH includes: kidney transplant, ESRD on dialysis, CHF, cirrhosis, EtOH.    PT Comments    Pt is progressing well with mobility and is ready to DC home from a PT standpoint. She ambulated 57' with RW and demonstrates good understanding of HEP.    Recommendations for follow up therapy are one component of a multi-disciplinary discharge planning process, led by the attending physician.  Recommendations may be updated based on patient status, additional functional criteria and insurance authorization.  Follow Up Recommendations  Home health PT     Assistance Recommended at Discharge Intermittent Supervision/Assistance  Patient can return home with the following Assistance with cooking/housework;A little help with bathing/dressing/bathroom;Help with stairs or ramp for entrance;Assist for transportation   Equipment Recommendations  Rolling walker (2 wheels);BSC/3in1    Recommendations for Other Services       Precautions / Restrictions Precautions Precautions: Fall Precaution Comments: denies falls in past 6 months Restrictions RLE Weight Bearing: Weight bearing as tolerated     Mobility  Bed Mobility               General bed mobility comments: up in recliner    Transfers Overall transfer level: Needs assistance Equipment used: Rolling walker (2 wheels) Transfers: Sit to/from Stand Sit to Stand: Supervision           General transfer comment: VCs hand placement    Ambulation/Gait Ambulation/Gait assistance: Supervision Gait Distance (Feet): 130 Feet Assistive device: Rolling walker (2 wheels) Gait Pattern/deviations: Step-to pattern, Decreased step length - right, Decreased step length - left Gait velocity: decr     General Gait Details: VCs sequencing, no  loss of balance   Stairs             Wheelchair Mobility    Modified Rankin (Stroke Patients Only)       Balance Overall balance assessment: Modified Independent                                          Cognition Arousal/Alertness: Awake/alert Behavior During Therapy: WFL for tasks assessed/performed Overall Cognitive Status: Within Functional Limits for tasks assessed                                          Exercises Total Joint Exercises Ankle Circles/Pumps: AROM, Both, 10 reps, Supine Quad Sets: AROM, Right, 5 reps, Supine Short Arc Quad: AROM, Right, 5 reps, Supine Heel Slides: AAROM, Right, 10 reps, Supine Hip ABduction/ADduction: AAROM, Right, 5 reps, Supine Long Arc Quad: AROM, Right, 5 reps, Seated    General Comments        Pertinent Vitals/Pain Pain Assessment Pain Score: 5  Pain Location: R hip with walking Pain Descriptors / Indicators: Sore Pain Intervention(s): Limited activity within patient's tolerance, Monitored during session, Premedicated before session, Ice applied    Home Living                          Prior Function            PT Goals (current goals can now be found in the care  plan section) Acute Rehab PT Goals Patient Stated Goal: walk, go home, play with great grandkids PT Goal Formulation: With patient Time For Goal Achievement: 12/16/21 Potential to Achieve Goals: Good Progress towards PT goals: Progressing toward goals    Frequency    7X/week      PT Plan Current plan remains appropriate    Co-evaluation              AM-PAC PT "6 Clicks" Mobility   Outcome Measure  Help needed turning from your back to your side while in a flat bed without using bedrails?: A Little Help needed moving from lying on your back to sitting on the side of a flat bed without using bedrails?: A Little Help needed moving to and from a bed to a chair (including a wheelchair)?:  None Help needed standing up from a chair using your arms (e.g., wheelchair or bedside chair)?: None Help needed to walk in hospital room?: None Help needed climbing 3-5 steps with a railing? : A Little 6 Click Score: 21    End of Session Equipment Utilized During Treatment: Gait belt Activity Tolerance: Patient tolerated treatment well Patient left: in chair;with call bell/phone within reach;with nursing/sitter in room (chair alarm pad under pt, no alarm box in room, NT notified) Nurse Communication: Mobility status PT Visit Diagnosis: Difficulty in walking, not elsewhere classified (R26.2);Pain Pain - Right/Left: Right Pain - part of body: Hip     Time: 3748-2707 PT Time Calculation (min) (ACUTE ONLY): 25 min  Charges:  $Gait Training: 8-22 mins $Therapeutic Exercise: 8-22 mins                     Blondell Reveal Kistler PT 12/10/2021  Acute Rehabilitation Services Pager [phone] Office [phone]

## 2021-12-10 NOTE — Discharge Summary (Signed)
Patient ID: Savannah Jackson MRN: [id number] DOB/AGE: [birth year] 66 y.o.  Admit date: 12/09/2021 Discharge date: 12/10/2021  Admission Diagnoses:  Principal Problem:   Unilateral primary osteoarthritis, right hip Active Problems:   Status post total hip replacement, right   Discharge Diagnoses:  Same  Past Medical History:  Diagnosis Date   Bilateral leg edema    Brain aneurysm    Breast disorder    right breast cancer 2002   [facility])    breast cancer- lumpectromy, , chemotherapy, radiation   Chronic back pain Diverticultis   Chronic kidney disease (CKD) stage G3b/A3, moderately decreased glomerular filtration rate (GFR) between 30-44 mL/min/1.73 square meter and albuminuria creatinine ratio greater than 300 mg/g ([place]) 08/30/2016   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   DDD (degenerative disc disease), lumbar    Diverticulosis    scope 2014   ETOH abuse    Hepatic encephalopathy 04/23/2017   Hepatomegaly    scope 2014   History of breast cancer 06/07/2016   Hypertension    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 female who was admitted 12/09/2021 for operative treatment ofUnilateral primary osteoarthritis, right hip. Patient has severe unremitting pain that affects sleep, daily activities, and work/hobbies. After pre-op clearance the patient was taken to the operating room on 12/09/2021 and underwent  Procedure(s): RIGHT TOTAL HIP ARTHROPLASTY ANTERIOR APPROACH.    Patient was  given perioperative antibiotics:  Anti-infectives (From admission, onward)    Start     Dose/Rate Route Frequency Ordered Stop   12/09/21 1800  ceFAZolin (ANCEF) IVPB 1 g/50 mL premix        1 g 100 mL/hr over 30 Minutes Intravenous Every 6 hours 12/09/21 1215 12/10/21 0009   12/09/21 0645  ceFAZolin (ANCEF) IVPB 2g/100 mL premix        2 g 200 mL/hr over 30 Minutes Intravenous On call to O.R. 12/09/21 3664 12/09/21 4034        Patient was given sequential compression devices, early ambulation, and chemoprophylaxis to prevent DVT.  Patient benefited maximally from hospital stay and there were no complications.    Recent vital signs: Patient Vitals for the past 24 hrs:  BP Temp Temp src Pulse Resp SpO2  12/10/21 0506 133/79 98.2 F (36.8 C) Oral 65 17 95 %  12/10/21 0055 112/81 98 F (36.7 C) Oral 79 17 96 %  12/09/21 2054 (!) 162/94 97.7 F (36.5 C) Oral 76 16 95 %  12/09/21 1730 (!) 160/80 (!) 97.5 F (36.4 C) Oral 70 16 93 %  12/09/21 1300 (!) 148/99 -- -- 62 16 95 %  12/09/21 1217 (!) 146/90 98 F (36.7 C) Oral 64 16 100 %  12/09/21 1130 (!) 149/81 97.7 F (36.5 C) -- 64 15 94 %  12/09/21 1115 139/82 -- -- (!) 59 16 94 %  12/09/21 1100 129/85 -- -- (!) 55 15 98 %  12/09/21 1045 115/83 -- -- (!) 58 14 97 %  12/09/21 1030 123/77 -- -- 62 11 100 %  12/09/21 1003 128/78 97.6 F (36.4 C) -- 68 12 95 %     Recent laboratory studies:  Recent Labs    12/10/21 0317  WBC 11.7*  HGB 13.0  HCT 42.3  PLT 330  NA 139  K 4.2  CL 107  CO2 26  BUN 13  CREATININE 0.54  GLUCOSE 153*  CALCIUM 8.0*     Discharge Medications:   Allergies as of 12/10/2021       Reactions   Latex Swelling, Other (See Comments)   Discoloration of skin.    Vicodin [hydrocodone-acetaminophen] Itching        Medication List     STOP taking these medications    aspirin EC 81 MG tablet Replaced by: aspirin 81 MG chewable tablet       TAKE these medications    Acetaminophen 325  MG Caps Take 650 mg by mouth as needed (Headache).   aspirin 81 MG chewable tablet Chew 1 tablet (81 mg total) by mouth 2 (two) times daily. Replaces: aspirin EC 81 MG tablet   atorvastatin 10 MG tablet Commonly known as: LIPITOR Take 10 mg by mouth daily.   Belbuca 300 MCG Film Generic drug: Buprenorphine HCl Take 300 mcg by mouth at bedtime.   cholecalciferol 25 MCG (1000 UNIT) tablet Commonly known as: VITAMIN D Take 1,000 Units by mouth daily.   Envarsus XR 1 MG Tb24 Generic drug: tacrolimus ER Take 2 mg by mouth daily before breakfast.   methocarbamol 500 MG tablet Commonly known as: ROBAXIN Take 1 tablet (500 mg total) by mouth every 6 (six) hours as needed for muscle spasms.   mycophenolate 180 MG EC tablet Commonly known as: MYFORTIC Take 360 mg by mouth in the morning.   oxyCODONE 5 MG immediate release tablet Commonly known as: Oxy IR/ROXICODONE Take 1-2 tablets (5-10 mg total) by mouth every 4 (four) hours as needed for moderate pain (pain score 4-6). What changed:  medication strength how much to take when to take this reasons to take this   pantoprazole 40 MG tablet Commonly known as: PROTONIX Take 40 mg by mouth daily.               Durable Medical Equipment  (From admission, onward)           Start     Ordered   12/09/21 1216  DME 3 n 1  Once        12/09/21 1215   12/09/21 1216  DME Walker rolling  Once       Question Answer Comment  Walker: With 5 Inch Wheels   Patient needs a walker to treat with the following condition Status post total replacement of right hip      12/09/21 1215            Diagnostic Studies: DG Pelvis Portable  Result Date: 12/09/2021 CLINICAL DATA:  Right hip replacement EXAM: PORTABLE PELVIS 1-2 VIEWS COMPARISON:  Same day intraoperative radiographs. FINDINGS: There is a right hip arthroplasty in normal alignment without evidence of loosening or periprosthetic fracture. Expected soft tissue changes.  IMPRESSION: No evidence of right hip arthroplasty complication on single frontal view. Electronically Signed   By: Maurine Simmering M.D.   On: 12/09/2021 11:04   DG C-Arm 1-60 Min-No Report  Result Date: 12/09/2021 Fluoroscopy was utilized by the requesting physician.  No radiographic interpretation.   DG HIP UNILAT WITH PELVIS 1V RIGHT  Result Date: 12/09/2021 CLINICAL DATA:  Right total hip arthroplasty EXAM: DG HIP (WITH OR WITHOUT PELVIS) 1V RIGHT COMPARISON:  None. FINDINGS: Fluoroscopic images were obtained intraoperatively and submitted for post operative interpretation. Right total hip arthroplasty with hardware in expected position, 6 images were obtained with 18.2 seconds of fluoroscopy time and 2.37 mGy. Please see the performing provider's procedural report for further detail. IMPRESSION: As above. Electronically Signed   By: Yetta Glassman M.D.   On: 12/09/2021 09:46    Disposition: Discharge disposition: 01-Home or Self Care       Discharge Instructions     Discharge patient   Complete by: As directed    Can discharge to home this afternoon.   Discharge disposition: 01-Home or Self Care   Discharge patient date: 12/10/2021        Follow-up Information     [facility].   Specialty: [place] Why: providing home physical therapy Contact information: [address] [place] [address] [phone]         Mcarthur Rossetti, MD Follow up in 2 week(s).   Specialty: Orthopedic Surgery Contact information: [address] [place] [address] [phone]                  Signed: Mcarthur Rossetti 12/10/2021, 8:16 AM

## 2021-12-10 NOTE — Progress Notes (Signed)
Subjective: 1 Day Post-Op Procedure(s) (LRB): RIGHT TOTAL HIP ARTHROPLASTY ANTERIOR APPROACH (Right) Patient reports pain as moderate.    Objective: Vital signs in last 24 hours: Temp:  [97.5 F (36.4 C)-98.2 F (36.8 C)] 98.2 F (36.8 C) (02/11 0506) Pulse Rate:  [55-79] 65 (02/11 0506) Resp:  [11-17] 17 (02/11 0506) BP: (112-162)/(77-99) 133/79 (02/11 0506) SpO2:  [93 %-100 %] 95 % (02/11 0506)  Intake/Output from previous day: 02/10 0701 - 02/11 0700 In: 2848.7 [P.O.:460; I.V.:2332.3; IV Piggyback:56.4] Out: 2000 [Urine:1850; Blood:150] Intake/Output this shift: No intake/output data recorded.  Recent Labs    12/10/21 0317  HGB 13.0   Recent Labs    12/10/21 0317  WBC 11.7*  RBC 4.89  HCT 42.3  PLT 330   Recent Labs    12/10/21 0317  NA 139  K 4.2  CL 107  CO2 26  BUN 13  CREATININE 0.54  GLUCOSE 153*  CALCIUM 8.0*   No results for input(s): LABPT, INR in the last 72 hours.  Sensation intact distally Intact pulses distally Dorsiflexion/Plantar flexion intact Incision: dressing C/D/I   Assessment/Plan: 1 Day Post-Op Procedure(s) (LRB): RIGHT TOTAL HIP ARTHROPLASTY ANTERIOR APPROACH (Right) Up with therapy Discharge home with home health      Mcarthur Rossetti 12/10/2021, 8:14 AM

## 2021-12-10 NOTE — Discharge Instructions (Signed)

## 2021-12-11 DIAGNOSIS — N042 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: Principal | ICD-10-CM

## 2021-12-13 ENCOUNTER — Encounter (HOSPITAL_COMMUNITY): Payer: Self-pay | Admitting: Orthopaedic Surgery

## 2021-12-15 ENCOUNTER — Telehealth: Payer: Self-pay

## 2021-12-15 ENCOUNTER — Telehealth: Payer: Self-pay | Admitting: Orthopaedic Surgery

## 2021-12-15 NOTE — Telephone Encounter (Signed)
Patient called to let us know she was going to start getting pain meds from her pain mgmt again

## 2021-12-15 NOTE — Telephone Encounter (Signed)
Pt called. She states she wants to speak with Dr.Blackman nurse about her appt tomorrow.   CB [phone]

## 2021-12-15 NOTE — Telephone Encounter (Signed)
FYI--she is going to start getting her pain meds from her pain mgmt again

## 2021-12-16 NOTE — Unmapped (Signed)
[facility] Clinical Assessment & Refill Coordination Note    Michelle Travis, DOB: Apr 05, 1956  Phone: [phone] (home)     All above HIPAA information was verified with patient.     Was a Nurse, learning disability used for this call? No    Specialty Medication(s):   Transplant:  mycophenolic acid 180mg      Current Outpatient Medications   Medication Sig Dispense Refill   ??? aspirin (ECOTRIN) 81 MG tablet Take 1 tablet (81 mg total) by mouth daily. 30 tablet 0   ??? atorvastatin (LIPITOR) 10 MG tablet Take 1 tablet (10 mg total) by mouth daily. 30 tablet 8   ??? BELBUCA 300 mcg buccal film Apply 1 each to cheek daily.     ??? cholecalciferol, vitamin D3, 1,000 unit (25 mcg) tablet Take 1,000 Units by mouth daily.     ??? ENVARSUS XR 1 mg Tb24 extended release tablet Take 2 tablets (2 mg total) by mouth daily. 60 tablet 11   ??? loratadine (CLARITIN) 10 mg tablet Take 10 mg by mouth daily. (Patient not taking: No sig reported)     ??? mycophenolate (CELLCEPT) 500 mg tablet Take 1 tablet (500 mg total) by mouth Two (2) times a day. (Patient not taking: Reported on 12/16/2021) 60 tablet 11   ??? mycophenolate (MYFORTIC) 180 MG EC tablet Take 2 tablets (360 mg total) by mouth two (2) times a day. 120 tablet 11   ??? oxyCODONE (ROXICODONE) 10 mg immediate release tablet TAKE 1 (ONE) TABLET BY MOUTH FOUR TIMES DAILY, AS NEEDED     ??? pantoprazole (PROTONIX) 40 MG tablet Take 1 tablet (40 mg total) by mouth daily. 30 tablet 0     No current facility-administered medications for this visit.        Changes to medications: [place] reports no changes at this time.    Allergies   Allergen Reactions   ??? Latex Swelling and Rash     Discoloration of skin.   Discoloration of skin.    ??? Hydrocodone Itching   ??? Hydrocodone-Acetaminophen Itching       Changes to allergies: No    SPECIALTY MEDICATION ADHERENCE     Mycophenolate 180mg   : 10 days of medicine on hand       Medication Adherence    Patient reported X missed doses in the last month: 0  Specialty Medication: mycophenolate 180mg   Adherence tools used: patient uses a pill box to manage medications          Specialty medication(s) dose(s) confirmed: Regimen is correct and unchanged.     Are there any concerns with adherence? No    Adherence counseling provided? Not needed    CLINICAL MANAGEMENT AND INTERVENTION      Clinical Benefit Assessment:    Do you feel the medicine is effective or helping your condition? Yes    Clinical Benefit counseling provided? Not needed    Adverse Effects Assessment:    Are you experiencing any side effects? No    Are you experiencing difficulty administering your medicine? No    Quality of Life Assessment:         How many days over the past month did your transplant  keep you from your normal activities? For example, brushing your teeth or getting up in the morning. 0    Have you discussed this with your provider? Not needed    Acute Infection Status:    Acute infections noted within Epic:  No  active infections  Patient reported infection: None    Therapy Appropriateness:    Is therapy appropriate and patient progressing towards therapeutic goals? Yes, therapy is appropriate and should be continued    DISEASE/MEDICATION-SPECIFIC INFORMATION      N/A    PATIENT SPECIFIC NEEDS     - Does the patient have any physical, cognitive, or cultural barriers? No    - Is the patient high risk? Yes, patient is taking a REMS drug. Medication is dispensed in compliance with REMS program    - Does the patient require a Care Management Plan? No           SHIPPING     Specialty Medication(s) to be Shipped:   Transplant:  mycophenolic acid 180mg     Other medication(s) to be shipped: No additional medications requested for fill at this time     Changes to insurance: No    Delivery Scheduled: Yes, Expected medication delivery date: 12/21/2021.     Medication will be delivered via UPS to the confirmed prescription address in [facility].    The patient will receive a drug information handout for each medication shipped and additional FDA Medication Guides as required.  Verified that patient has previously received a Conservation officer, historic buildings and a Surveyor, mining.    The patient or caregiver noted above participated in the development of this care plan and knows that they can request review of or adjustments to the care plan at any time.      All of the patient's questions and concerns have been addressed.    [name]   [facility] Specialty Pharmacist

## 2021-12-16 NOTE — Unmapped (Signed)
[facility] pharmacist sent message to pt's primary coordinator and kidney txp coordinator saying:  Patient is currently taking generic myfortic; a script for generic cellcept was sent in January but only a $3 cost savins for patient. According to pharmacist, patient decided to stay on the myfortic instead; asked for the cellcept to be discontinued to avoid confusion.     Discontinued the generic cellcept and messaged back. Will route to pt's liver txp coordinator to be aware.

## 2021-12-20 MED FILL — MYCOPHENOLATE SODIUM 180 MG TABLET,DELAYED RELEASE: ORAL | 30 days supply | Qty: 120 | Fill #1

## 2021-12-22 ENCOUNTER — Encounter: Payer: Self-pay | Admitting: Orthopaedic Surgery

## 2021-12-22 ENCOUNTER — Ambulatory Visit (INDEPENDENT_AMBULATORY_CARE_PROVIDER_SITE_OTHER): Payer: Medicare HMO | Admitting: Orthopaedic Surgery

## 2021-12-22 DIAGNOSIS — Z96641 Presence of right artificial hip joint: Secondary | ICD-10-CM

## 2021-12-22 NOTE — Progress Notes (Signed)
The patient is 2 weeks tomorrow status post a right total hip arthroplasty.  She is ambulating with a rolling walker and doing well overall.  I did remove the sutures in place Steri-Strips over right hip incision.  There was no significant effusion.  Her calf is soft.  She has been taking a baby aspirin twice a day.  She was on this once a day prior to surgery.  At this point she can continue to increase her activities as comfort allows.  She is released to drive from my standpoint.  She has 1 more home health visit and they will work on transitioning from a walker to a cane.  She does not need a refill of pain medications.  We will see her back in 4 weeks to see how she is doing overall but no x-rays are needed.

## 2022-01-08 DIAGNOSIS — N042 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: Principal | ICD-10-CM

## 2022-01-11 NOTE — Unmapped (Signed)
[facility] Refill Coordination Note    Specialty Medication(s) to be Shipped:   Transplant:  mycophenolic acid 180mg     Other medication(s) to be shipped: No additional medications requested for fill at this time     Michelle Travis, DOB: [birth year]  Phone: [phone] (home)       All above HIPAA information was verified with patient.     Was a Nurse, learning disability used for this call? No    Completed refill call assessment today to schedule patient's medication shipment from the [facility] [phone]).  All relevant notes have been reviewed.     Specialty medication(s) and dose(s) confirmed: Regimen is correct and unchanged.   Changes to medications: [place] reports no changes at this time.  Changes to insurance: No  New side effects reported not previously addressed with a pharmacist or physician: None reported  Questions for the pharmacist: No    Confirmed patient received a Conservation officer, historic buildings and a Surveyor, mining with first shipment. The patient will receive a drug information handout for each medication shipped and additional FDA Medication Guides as required.       DISEASE/MEDICATION-SPECIFIC INFORMATION        N/A    SPECIALTY MEDICATION ADHERENCE     Medication Adherence    Patient reported X missed doses in the last month: 0  Specialty Medication: mycophenolate 180 MG EC tablet (MYFORTIC)  Patient is on additional specialty medications: No  Adherence tools used: patient uses a pill box to manage medications         mycophenolic acid 180mg  10 days worth of medication on hand.         Were doses missed due to medication being on hold? No    REFERRAL TO PHARMACIST     Referral to the pharmacist: Not needed      [facility]     Shipping address confirmed in Epic.     Delivery Scheduled: Yes, Expected medication delivery date: 01/18/22.     Medication will be delivered via UPS to the prescription address in Epic [facility].    [place] A Shariah Assad   [facility] Specialty Technician

## 2022-01-16 DIAGNOSIS — Z940 Kidney transplant status: Principal | ICD-10-CM

## 2022-01-16 DIAGNOSIS — Z1159 Encounter for screening for other viral diseases: Principal | ICD-10-CM

## 2022-01-16 DIAGNOSIS — Z79899 Other long term (current) drug therapy: Principal | ICD-10-CM

## 2022-01-16 NOTE — Unmapped (Signed)
Patient paged on call TNC reporting dark urine and noted bubbles have increased again. Advised her to leave a urine sample and get her blood drawn today if possible. She will go to [facility] in the morning for both. Dr Margaretmary Bayley made aware - will wait for lab results to determine next steps.

## 2022-01-17 LAB — CBC W/ DIFFERENTIAL
BANDED NEUTROPHILS ABSOLUTE COUNT: 0 10*3/uL (ref 0.0–0.1)
BASOPHILS ABSOLUTE COUNT: 0.1 10*3/uL (ref 0.0–0.2)
BASOPHILS RELATIVE PERCENT: 1 %
EOSINOPHILS ABSOLUTE COUNT: 0.2 10*3/uL (ref 0.0–0.4)
EOSINOPHILS RELATIVE PERCENT: 3 %
HEMATOCRIT: 37.5 % (ref 34.0–46.6)
HEMOGLOBIN: 12 g/dL (ref 11.1–15.9)
IMMATURE GRANULOCYTES: 1 %
LYMPHOCYTES ABSOLUTE COUNT: 1.5 10*3/uL (ref 0.7–3.1)
LYMPHOCYTES RELATIVE PERCENT: 19 %
MEAN CORPUSCULAR HEMOGLOBIN CONC: 32 g/dL (ref 31.5–35.7)
MEAN CORPUSCULAR HEMOGLOBIN: 26.2 pg — ABNORMAL LOW (ref 26.6–33.0)
MEAN CORPUSCULAR VOLUME: 82 fL (ref 79–97)
MONOCYTES ABSOLUTE COUNT: 0.6 10*3/uL (ref 0.1–0.9)
MONOCYTES RELATIVE PERCENT: 8 %
NEUTROPHILS ABSOLUTE COUNT: 5.3 10*3/uL (ref 1.4–7.0)
NEUTROPHILS RELATIVE PERCENT: 68 %
PLATELET COUNT: 530 10*3/uL — ABNORMAL HIGH (ref 150–450)
RED BLOOD CELL COUNT: 4.58 x10E6/uL (ref 3.77–5.28)
RED CELL DISTRIBUTION WIDTH: 14.1 % (ref 11.7–15.4)
WHITE BLOOD CELL COUNT: 7.7 10*3/uL (ref 3.4–10.8)

## 2022-01-17 MED FILL — MYCOPHENOLATE SODIUM 180 MG TABLET,DELAYED RELEASE: ORAL | 30 days supply | Qty: 120 | Fill #2

## 2022-01-18 LAB — URINALYSIS WITH MICROSCOPY
BILIRUBIN UA: NEGATIVE
GLUCOSE UA: NEGATIVE
KETONES UA: NEGATIVE
LEUKOCYTE ESTERASE UA: NEGATIVE
NITRITE UA: NEGATIVE
PH UA: 5.5 (ref 5.0–7.5)
SPECIFIC GRAVITY UA: 1.026 (ref 1.005–1.030)
UROBILINOGEN UA: 0.2 mg/dL (ref 0.2–1.0)

## 2022-01-18 LAB — BASIC METABOLIC PANEL
BLOOD UREA NITROGEN: 9 mg/dL (ref 8–27)
BUN / CREAT RATIO: 19 (ref 12–28)
CALCIUM: 8.6 mg/dL — ABNORMAL LOW (ref 8.7–10.3)
CHLORIDE: 107 mmol/L — ABNORMAL HIGH (ref 96–106)
CO2: 22 mmol/L (ref 20–29)
CREATININE: 0.47 mg/dL — ABNORMAL LOW (ref 0.57–1.00)
EGFR: 105 mL/min/{1.73_m2}
GLUCOSE: 97 mg/dL (ref 70–99)
POTASSIUM: 4.4 mmol/L (ref 3.5–5.2)
SODIUM: 143 mmol/L (ref 134–144)

## 2022-01-18 LAB — MAGNESIUM: MAGNESIUM: 1.6 mg/dL (ref 1.6–2.3)

## 2022-01-18 LAB — PHOSPHORUS: PHOSPHORUS, SERUM: 3.6 mg/dL (ref 3.0–4.3)

## 2022-01-18 LAB — MICROSCOPIC EXAMINATION: EPITHELIAL CELLS (NON RENAL): >10 /HPF — AB (ref 0–10)

## 2022-01-18 LAB — PROTEIN / CREATININE RATIO, URINE
CREATININE URINE: 115.4 mg/dL
PROTEIN URINE: 1016.1 mg/dL
PROTEIN/CREAT RATIO: 8805 mg/g{creat} — ABNORMAL HIGH (ref 0–200)

## 2022-01-18 NOTE — Unmapped (Signed)
Patient called wanting more info. Lauren and Lyla Son aware.

## 2022-01-19 ENCOUNTER — Encounter: Payer: Self-pay | Admitting: Orthopaedic Surgery

## 2022-01-19 ENCOUNTER — Ambulatory Visit (INDEPENDENT_AMBULATORY_CARE_PROVIDER_SITE_OTHER): Payer: Medicare HMO | Admitting: Orthopaedic Surgery

## 2022-01-19 DIAGNOSIS — Z96641 Presence of right artificial hip joint: Secondary | ICD-10-CM

## 2022-01-19 LAB — TACROLIMUS LEVEL: TACROLIMUS BLOOD: 2.7 ng/mL (ref 2.0–20.0)

## 2022-01-19 NOTE — Unmapped (Signed)
Called pt to check in as she paged on-call kidney TNC. Pt asking to discuss 3/21 urine & serum lab results. No other symptoms present aside from bubbles in urine. Pt has overdue follow up with Dr. Margaretmary Bayley from January of this year, advised pt to call nephrology clinic for first available appt. Pt verbalized understanding.

## 2022-01-19 NOTE — Progress Notes (Signed)
The patient is now 6 weeks status post a right total hip arthroplasty.  She does ambulate with a cane in the right hip is doing well with just a little bit of stiffness.  She has been dealing with chronic issues with her left knee.  Her daughter is with her today as well.  She is only 66 years old.  She wants to get out and do more activities on her own out in the yard. ? ?Her right operative hip moves smoothly and fluidly.  There is no complicating features for blocks or rotation.  Her leg lengths appear equal. ? ?She can increase her activities as comfort allows from my standpoint.  I will see her back in 3 months.  At that visit I have a standing low AP pelvis and lateral of her right operative hip.  Since she is having such significant left knee issues we will have an AP and lateral of her left knee at that visit. ?

## 2022-01-20 DIAGNOSIS — Z79899 Other long term (current) drug therapy: Principal | ICD-10-CM

## 2022-01-20 DIAGNOSIS — Z940 Kidney transplant status: Principal | ICD-10-CM

## 2022-01-20 DIAGNOSIS — N022 Recurrent and persistent hematuria with diffuse membranous glomerulonephritis: Principal | ICD-10-CM

## 2022-01-20 DIAGNOSIS — Z48298 Encounter for aftercare following other organ transplant: Principal | ICD-10-CM

## 2022-01-20 NOTE — Unmapped (Addendum)
Reviewed UPC with Dr. Margaretmary Bayley. He would like for her to get 2 more doses of ritux. Therapy plan entered. Ritux workup panel lab ordered. Will work on scheduling this for the patient on 3/31 when she is at ET for her appt.

## 2022-01-24 DIAGNOSIS — Z940 Kidney transplant status: Principal | ICD-10-CM

## 2022-01-24 DIAGNOSIS — D849 Immunodeficiency, unspecified: Principal | ICD-10-CM

## 2022-01-24 DIAGNOSIS — N042 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: Principal | ICD-10-CM

## 2022-01-24 DIAGNOSIS — N022 Recurrent and persistent hematuria with diffuse membranous glomerulonephritis: Principal | ICD-10-CM

## 2022-01-24 DIAGNOSIS — Z48298 Encounter for aftercare following other organ transplant: Principal | ICD-10-CM

## 2022-01-24 NOTE — Unmapped (Signed)
Addended by: Jackey Loge on: 01/24/2022 11:10 AM     Modules accepted: Orders

## 2022-01-25 DIAGNOSIS — Z48298 Encounter for aftercare following other organ transplant: Principal | ICD-10-CM

## 2022-01-25 DIAGNOSIS — N042 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: Principal | ICD-10-CM

## 2022-01-25 DIAGNOSIS — D849 Immunodeficiency, unspecified: Principal | ICD-10-CM

## 2022-01-25 DIAGNOSIS — Z940 Kidney transplant status: Principal | ICD-10-CM

## 2022-01-25 DIAGNOSIS — N022 Recurrent and persistent hematuria with diffuse membranous glomerulonephritis: Principal | ICD-10-CM

## 2022-01-26 NOTE — Unmapped (Signed)
Attempted to reach the patient to let her know the PA for the Ritux treatments is still pending approval so she cannot receive her first of two doses tomorrow while in clinic. Left a VM letting the patient know. Will ask primary coordinator to follow-up with this need.

## 2022-01-27 ENCOUNTER — Ambulatory Visit: Admit: 2022-01-27 | Discharge: 2022-01-28 | Payer: MEDICARE | Attending: Nephrology | Primary: Nephrology

## 2022-01-27 ENCOUNTER — Ambulatory Visit: Admit: 2022-01-27 | Discharge: 2022-01-27 | Payer: MEDICARE

## 2022-01-27 DIAGNOSIS — Z940 Kidney transplant status: Principal | ICD-10-CM

## 2022-01-27 DIAGNOSIS — Z48298 Encounter for aftercare following other organ transplant: Principal | ICD-10-CM

## 2022-01-27 DIAGNOSIS — I10 Essential (primary) hypertension: Principal | ICD-10-CM

## 2022-01-27 DIAGNOSIS — N042 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: Principal | ICD-10-CM

## 2022-01-27 DIAGNOSIS — Z86010 Personal history of colonic polyps: Principal | ICD-10-CM

## 2022-01-27 DIAGNOSIS — Z944 Liver transplant status: Principal | ICD-10-CM

## 2022-01-27 DIAGNOSIS — N022 Recurrent and persistent hematuria with diffuse membranous glomerulonephritis: Principal | ICD-10-CM

## 2022-01-27 DIAGNOSIS — D849 Immunodeficiency, unspecified: Principal | ICD-10-CM

## 2022-01-27 DIAGNOSIS — Z79899 Other long term (current) drug therapy: Principal | ICD-10-CM

## 2022-01-27 DIAGNOSIS — Z853 Personal history of malignant neoplasm of breast: Principal | ICD-10-CM

## 2022-01-27 LAB — COMPREHENSIVE METABOLIC PANEL
ALBUMIN: 2.4 g/dL — ABNORMAL LOW (ref 3.4–5.0)
ALKALINE PHOSPHATASE: 79 U/L (ref 46–116)
ALT (SGPT): <7 U/L — ABNORMAL LOW (ref 10–49)
ANION GAP: 5 mmol/L (ref 5–14)
AST (SGOT): 14 U/L (ref <=34)
BILIRUBIN TOTAL: 0.3 mg/dL (ref 0.3–1.2)
BLOOD UREA NITROGEN: 10 mg/dL (ref 9–23)
BUN / CREAT RATIO: 19
CALCIUM: 8.9 mg/dL (ref 8.7–10.4)
CHLORIDE: 106 mmol/L (ref 98–107)
CO2: 30.2 mmol/L (ref 20.0–31.0)
CREATININE: 0.53 mg/dL — ABNORMAL LOW
EGFR CKD-EPI (2021) FEMALE: >90 mL/min/{1.73_m2} (ref >=60)
GLUCOSE RANDOM: 81 mg/dL (ref 70–99)
POTASSIUM: 4 mmol/L (ref 3.4–4.8)
PROTEIN TOTAL: 6.3 g/dL (ref 5.7–8.2)
SODIUM: 141 mmol/L (ref 135–145)

## 2022-01-27 LAB — URINALYSIS
BILIRUBIN UA: NEGATIVE
GLUCOSE UA: NEGATIVE
KETONES UA: NEGATIVE
LEUKOCYTE ESTERASE UA: NEGATIVE
NITRITE UA: NEGATIVE
PH UA: 6.5 (ref 5.0–9.0)
PROTEIN UA: >300 — AB
RBC UA: 4 /HPF — ABNORMAL HIGH (ref <4)
SPECIFIC GRAVITY UA: 1.025 (ref 1.005–1.030)
SQUAMOUS EPITHELIAL: 22 /HPF — ABNORMAL HIGH (ref 0–5)
TRANSITIONAL EPITHELIAL: 3 /HPF — ABNORMAL HIGH (ref 0–2)
UROBILINOGEN UA: 1
WBC UA: 10 /HPF — ABNORMAL HIGH (ref 0–5)

## 2022-01-27 LAB — CBC W/ AUTO DIFF
BASOPHILS ABSOLUTE COUNT: 0.1 10*9/L (ref 0.0–0.1)
BASOPHILS RELATIVE PERCENT: 1.2 %
EOSINOPHILS ABSOLUTE COUNT: 0.2 10*9/L (ref 0.0–0.5)
EOSINOPHILS RELATIVE PERCENT: 3.5 %
HEMATOCRIT: 38.3 % (ref 34.0–44.0)
HEMOGLOBIN: 12.3 g/dL (ref 11.3–14.9)
LYMPHOCYTES ABSOLUTE COUNT: 1.6 10*9/L (ref 1.1–3.6)
LYMPHOCYTES RELATIVE PERCENT: 27.9 %
MEAN CORPUSCULAR HEMOGLOBIN CONC: 32.1 g/dL (ref 32.0–36.0)
MEAN CORPUSCULAR HEMOGLOBIN: 26.4 pg (ref 25.9–32.4)
MEAN CORPUSCULAR VOLUME: 82.2 fL (ref 77.6–95.7)
MEAN PLATELET VOLUME: 8.4 fL (ref 6.8–10.7)
MONOCYTES ABSOLUTE COUNT: 0.5 10*9/L (ref 0.3–0.8)
MONOCYTES RELATIVE PERCENT: 9.2 %
NEUTROPHILS ABSOLUTE COUNT: 3.4 10*9/L (ref 1.8–7.8)
NEUTROPHILS RELATIVE PERCENT: 58.2 %
NUCLEATED RED BLOOD CELLS: 0 /100{WBCs} (ref <=4)
PLATELET COUNT: 449 10*9/L (ref 150–450)
RED BLOOD CELL COUNT: 4.66 10*12/L (ref 3.95–5.13)
RED CELL DISTRIBUTION WIDTH: 15.8 % — ABNORMAL HIGH (ref 12.2–15.2)
WBC ADJUSTED: 5.8 10*9/L (ref 3.6–11.2)

## 2022-01-27 LAB — PROTEIN / CREATININE RATIO, URINE
CREATININE, URINE: 107.8 mg/dL
PROTEIN URINE: 748.1 mg/dL
PROTEIN/CREAT RATIO, URINE: 6.94

## 2022-01-27 LAB — MAGNESIUM: MAGNESIUM: 1.5 mg/dL — ABNORMAL LOW (ref 1.6–2.6)

## 2022-01-27 LAB — TACROLIMUS LEVEL, TROUGH: TACROLIMUS, TROUGH: 4.3 ng/mL — ABNORMAL LOW (ref 5.0–15.0)

## 2022-01-27 LAB — PARATHYROID HORMONE (PTH): PARATHYROID HORMONE INTACT: 100.1 pg/mL — ABNORMAL HIGH (ref 18.4–80.1)

## 2022-01-27 LAB — PHOSPHORUS: PHOSPHORUS: 4 mg/dL (ref 2.4–5.1)

## 2022-01-27 NOTE — Unmapped (Signed)
Transplant Nephrology Clinic Visit    Assessment and Plan  Michelle Travis is a 66 y.o. 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 She has recurrent PLA2R positive membranous nephropathy and is seen for follow up of her transplant and associated medical problems. Issues addressed today include:    Status post kidney transplant with membranous nephropathy recurrence.  - Kidney biopsy 03/11/21 with PLA2R positive membranous nephropathy  - Treatment included Rituximab 1 gm on 03/23/21 and 1 gm on 04/07/21 and 125 mg solumedrol x 2 given with the doses of Rituximab.   - UPC prior to the biopsy was 2.3 and peaked at 5.02 on 03/23/21. UPC gradually improved to 0.623 on 07/20/21 but has now returned to nephrotic range with UPC today of 6.94.    - Serum creatinine has remained stable with value today of 0.53 mg/dL (baseline < 0.8 mg/dL)  - Will plan 2 more doses of Rituximab 1 gm with 125 mg solumedrol at each infusion  - Patient at high risk of DVT in view of membranous nephropathy recurrence, albumin level 2.4, and recent hip replacement. Exam does not suggest DVT. I have educated her about the signs/symptoms of DVT/PE. Will continue aspirin.     S/P Liver transplant with excellent liver function.   - LFTs normal today  - She will continue to follow with the liver transplant team.     Immunosuppression.   - Tacrolimus level is 4.3 (target 3-6).   - Patient will continue Envarsus 2 mg daily and Myfortic 360 mg BID.     S/P right total hip replacement  - She will follow up with ortho clinic.    History of IgG lambda monoclonal gammopathy.   - SPEP with immunofixation 03/19/20 revealed no monoclonal protein. Kidney biopsy revealed no evidence of MGRS.    History of sleep apnea.   - She is no longer on CPAP.  - She denies daytime drowsiness.     Bilateral knee pain.   Will follow up with ortho clinic.     Immunizations.  -  Prevnar-20 07/20/21.  - Flu vaccine 07/20/21   - Shingrix x 2 completed  - COVID-19  bivalent booster dose completed 09/07/21. She is aware of the limited efficacy of the vaccine.     Cancer screening.   - Mammography 05/25/21 negative, has remote history of breast cancer.  - PAP smear last year was unremarkable.  - Colonoscopy 08/07/18 with single 6 mm polyp and diverticulosis.     Follow-up.   Patient will return to clinic in 2 months.   Monthly labs including UPC.    History of Present Illness  Michelle Travis is a 66 y.o. 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  She was found to have proteinuria and kidney biopsy 03/11/21 revealed recurrent membranous nephropathy. She received 2 doses of Rituximab paired with solumedrol 125 mg with subsequent improvement in proteinuria to 0.623 on 07/20/21.     She developed foamy urine and went for urine testing on 01/17/22 revealing a UPC of 8.8. She has been scheduled to resume rituximab, but openings in infusion clinic were not available until 02/17/22. She has mild symmetric LE edema. She denies calf pain, shortness of breath, chest pain, hematuria, or hemoptysis. She denies dysuria, allograft tenderness, fever or chills. She denies headache, tremors, nausea, vomiting or diarrhea. She underwent right total hip replacement 12/09/21 and now has left knee pain.  Immunosuppression is unchanged at Envarsus 2 mg daily and Myfortic 360 mg bid. She took her dose of Envarsus at 9 AM dose yesterday.    Transplant History:  1. 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 S/P kidney and liver transplants on 09/13/17 at [facility]. Kidney KDPI 26%. CMV D+/R+; EBV D-/R+, hep C Ab +/NAT- (recipient hep C Ab negative)  4. Complicated by rectus sheath hematoma  5. Delayed graft function requiring hemodialysis sessions following transplant (stopped dialysis 11/26/018).  6. Immunosuppression was ??? induction followed by tacrolimus and myfortic maintenance therapy.   7. Baseline creatinine <1.0 mg/dL.  8. Kidney biopsy 03/11/21 with recurrent PLA2R positive membranous nephropathy, treated with Rituximab 1 gm x 2 (03/23/21, 04/07/21).     Past Medical History:  1. Alcoholic cirrhosis, S/P TIPS  2. Membranous nephropathy, PLA2R positive biopsy 08/30/2016, treated solumedrol initially with little improvement then with Rituximab (10/2016 and 03/2017).   3. S/P kidney and liver transplants as stated above  4. History of breast cancer, in remission for 17 years, s/p lumpectomy.  5. History of IGG monoclonal gammopathy, IgG lambda. Kidney biopsy 08/30/2016 without MGRS.  6. S/p cerebral aneurysm repair.  7. S/P total knee replacement, right   8. Essential HTN prior to ESLD  9. History of diverticulitis  10. Recurrent pleural effusion, history of pneumonia x 3  11. Osteopenia by DEXA 06/14/2016  12. Hypoattenuating thyroid nodule noted on chest CT 11/11/2016  13. Sleep apnea    Review of Systems    All other systems are reviewed and are negative.    Medications    Current Outpatient Medications   Medication Sig Dispense Refill    aspirin (ECOTRIN) 81 MG tablet Take 1 tablet (81 mg total) by mouth daily. 30 tablet 0    atorvastatin (LIPITOR) 10 MG tablet Take 1 tablet (10 mg total) by mouth daily. 30 tablet 8    BELBUCA 300 mcg buccal film Apply 1 each to cheek daily.      cholecalciferol, vitamin D3, 1,000 unit (25 mcg) tablet Take 1,000 Units by mouth daily.      ENVARSUS XR 1 mg Tb24 extended release tablet Take 2 tablets (2 mg total) by mouth daily. 60 tablet 11    loratadine (CLARITIN) 10 mg tablet Take 10 mg by mouth daily. (Patient not taking: No sig reported)      mycophenolate (MYFORTIC) 180 MG EC tablet Take 2 tablets (360 mg total) by mouth two (2) times a day. 120 tablet 11    oxyCODONE (ROXICODONE) 10 mg immediate release tablet TAKE 1 (ONE) TABLET BY MOUTH FOUR TIMES DAILY, AS NEEDED      pantoprazole (PROTONIX) 40 MG tablet Take 1 tablet (40 mg total) by mouth daily. 30 tablet 0     No current facility-administered medications for this visit.       Physical Exam    BP 150/92 (BP Site: R Arm, BP Position: Sitting, BP Cuff Size: Large)  - Pulse 60  - Temp 36.4 ??C (97.6 ??F) (Temporal)  - Ht 162.6 cm (5' 4)  - Wt 86.5 kg (190 lb 9.6 oz)  - LMP  (LMP Unknown)  - BMI 32.72 kg/m??   General: Patient is a pleasant female in no apparent distress.  Eyes: Sclera anicteric.  Neck: Supple without LAD/JVD/bruits.  Lungs: Clear to auscultation bilaterally, no wheezes/rales/rhonchi.  Cardiovascular: Regular rate and rhythm without murmurs, rubs  or gallops.  Abdomen: Soft, notender/nondistended. Positive bowel sounds. No hepatosplenomegaly, masses or bruits appreciated.   Extremities: trace bilateral edema  Skin: Without rash  Neurological: Grossly nonfocal.  Psychiatric: Mood and affect appropriate.    Laboratory Results and Imaging Reviewed

## 2022-01-27 NOTE — Unmapped (Signed)
Transplant Coordinator, Clinic Visit   Pt seen today by transplant nephrology for follow up, reviewed medications and symptoms.          01/27/22 0951   BP: 150/92   Pulse: 60   Temp: 36.4 ??C (97.6 ??F)   Weight: 86.5 kg (190 lb 9.6 oz)   Height: 162.6 cm (5' 4)       Assessment  BP: not checking at home  Lightheaded: denies  Headache: none  Hand tremors: none  Numbness/tingling: none  Fevers: none  Chills/sweats: denies  Shortness of breath: denies  Chest pain or pressure: none  Palpitations: none  Abdominal pain: denies  Heart burn: none, taking daily Protonix  Nausea/vomiting: denies  Diarrhea/constipation: denies  UTI symptoms: denies  Swelling: bilateral lower extremity swelling to ankles and feet, improves when lying down  Sleep: sleeps well  Pain: mild pain/stiffness, recovering from hip surgery in February, taking Tylenol and Oxycodone PRN      Good appetite; reports adequate hydration.     Intake: 3-4 bottles water per day  Output: adequate    Any new medications? none  Immunosuppressant last taken: 0900 yesterday (Envarsus)    Immunization status: up to date    Functional Score: 90     Able to carry on normal activity;  Minor signs or symptoms of disease.      I spent a total of 20 minutes with Michelle Travis reviewing medications and symptoms.

## 2022-01-27 NOTE — Unmapped (Signed)
AOBP:  right  arm   large cuff   Average: 150/92 Pulse:60  1st reading: 161/89  Pulse:62  2nd reading: 142/99  Pulse:57  3rd reading: 148/89 Pulse:60

## 2022-01-28 LAB — RITUXAN CD20, FLOW
ABSOLUTE CD16/56 CNT: 160 {cells}/uL (ref 15–1080)
ABSOLUTE CD19 CNT: 176 {cells}/uL (ref 105–920)
ABSOLUTE CD3 CNT: 1264 {cells}/uL (ref 915–3400)
CD16/56%NK CELL: 10 % (ref 1–27)
CD19% (B CELLS): 11 % (ref 7–23)
CD3% (T CELLS): 79 % (ref 61–86)
RITUXIN CD45%: 100

## 2022-01-28 LAB — CMV DNA, QUANTITATIVE, PCR: CMV VIRAL LD: NOT DETECTED

## 2022-01-30 NOTE — Unmapped (Signed)
4/3 - Contacted pt to schedule annual liver txp follow up, LVM for pt to return ph call.

## 2022-01-31 LAB — BK VIRUS QUANTITATIVE PCR, BLOOD: BK BLOOD RESULT: NOT DETECTED

## 2022-01-31 NOTE — Unmapped (Signed)
Called and spoke with patient regarding Rituximab infusion.  Spoke with [facility] infusion room who confirmed they can infuse patient 4/11.  Confirmed date and time with patient.  Patient will be able to make it to this.  Cancelled 4/26 appt.  Patient aware and verbalized understanding.      Message routed to primary TNC.

## 2022-02-01 LAB — VITAMIN D 25 HYDROXY: VITAMIN D, TOTAL (25OH): 67.2 ng/mL (ref 20.0–80.0)

## 2022-02-04 LAB — HLA DS POST TRANSPLANT
ANTI-DONOR HLA-A #1 MFI: 0 MFI
ANTI-DONOR HLA-A #2 MFI: 0 MFI
ANTI-DONOR HLA-B #1 MFI: 34 MFI
ANTI-DONOR HLA-B #2 MFI: 0 MFI
ANTI-DONOR HLA-C #1 MFI: 0 MFI
ANTI-DONOR HLA-C #2 MFI: 0 MFI
ANTI-DONOR HLA-DP AG #1 MFI: 67 MFI
ANTI-DONOR HLA-DQB #1 MFI: 2 MFI
ANTI-DONOR HLA-DQB #2 MFI: 0 MFI
ANTI-DONOR HLA-DR #1 MFI: 34 MFI
ANTI-DONOR HLA-DR #2 MFI: 0 MFI

## 2022-02-04 LAB — FSAB CLASS 2 ANTIBODY SPECIFICITY: HLA CL2 AB RESULT: NEGATIVE

## 2022-02-04 LAB — FSAB CLASS 1 ANTIBODY SPECIFICITY: HLA CLASS 1 ANTIBODY RESULT: NEGATIVE

## 2022-02-05 DIAGNOSIS — N042 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: Principal | ICD-10-CM

## 2022-02-06 DIAGNOSIS — Z940 Kidney transplant status: Principal | ICD-10-CM

## 2022-02-06 DIAGNOSIS — Z48298 Encounter for aftercare following other organ transplant: Principal | ICD-10-CM

## 2022-02-06 DIAGNOSIS — D849 Immunodeficiency, unspecified: Principal | ICD-10-CM

## 2022-02-06 DIAGNOSIS — N022 Recurrent and persistent hematuria with diffuse membranous glomerulonephritis: Principal | ICD-10-CM

## 2022-02-06 DIAGNOSIS — N042 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: Principal | ICD-10-CM

## 2022-02-07 ENCOUNTER — Ambulatory Visit: Admit: 2022-02-07 | Discharge: 2022-02-08 | Payer: MEDICARE

## 2022-02-07 DIAGNOSIS — Z48298 Encounter for aftercare following other organ transplant: Principal | ICD-10-CM

## 2022-02-07 DIAGNOSIS — Z940 Kidney transplant status: Principal | ICD-10-CM

## 2022-02-07 DIAGNOSIS — D849 Immunodeficiency, unspecified: Principal | ICD-10-CM

## 2022-02-07 DIAGNOSIS — N042 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: Principal | ICD-10-CM

## 2022-02-07 DIAGNOSIS — N022 Recurrent and persistent hematuria with diffuse membranous glomerulonephritis: Principal | ICD-10-CM

## 2022-02-07 LAB — CBC W/ AUTO DIFF
BASOPHILS ABSOLUTE COUNT: 0 10*9/L (ref 0.0–0.1)
BASOPHILS RELATIVE PERCENT: 0.7 %
EOSINOPHILS ABSOLUTE COUNT: 0.2 10*9/L (ref 0.0–0.5)
EOSINOPHILS RELATIVE PERCENT: 3 %
HEMATOCRIT: 39.3 % (ref 34.0–44.0)
HEMOGLOBIN: 12.6 g/dL (ref 11.3–14.9)
LYMPHOCYTES ABSOLUTE COUNT: 1.5 10*9/L (ref 1.1–3.6)
LYMPHOCYTES RELATIVE PERCENT: 28.6 %
MEAN CORPUSCULAR HEMOGLOBIN CONC: 32.1 g/dL (ref 32.0–36.0)
MEAN CORPUSCULAR HEMOGLOBIN: 26.4 pg (ref 25.9–32.4)
MEAN CORPUSCULAR VOLUME: 82.2 fL (ref 77.6–95.7)
MEAN PLATELET VOLUME: 8.3 fL (ref 6.8–10.7)
MONOCYTES ABSOLUTE COUNT: 0.4 10*9/L (ref 0.3–0.8)
MONOCYTES RELATIVE PERCENT: 8.1 %
NEUTROPHILS ABSOLUTE COUNT: 3.1 10*9/L (ref 1.8–7.8)
NEUTROPHILS RELATIVE PERCENT: 59.6 %
PLATELET COUNT: 392 10*9/L (ref 150–450)
RED BLOOD CELL COUNT: 4.78 10*12/L (ref 3.95–5.13)
RED CELL DISTRIBUTION WIDTH: 15.9 % — ABNORMAL HIGH (ref 12.2–15.2)
WBC ADJUSTED: 5.2 10*9/L (ref 3.6–11.2)

## 2022-02-07 LAB — PROTEIN / CREATININE RATIO, URINE
CREATININE, URINE: 106.2 mg/dL
PROTEIN URINE: 919.7 mg/dL
PROTEIN/CREAT RATIO, URINE: 8.66

## 2022-02-07 MED ADMIN — sodium chloride (NS) 0.9 % infusion: 20 mL/h | INTRAVENOUS | @ 13:00:00 | Stop: 2022-02-07

## 2022-02-07 MED ADMIN — famotidine (PF) (PEPCID) injection 20 mg: 20 mg | INTRAVENOUS | @ 15:00:00 | Stop: 2022-02-07

## 2022-02-07 MED ADMIN — riTUXimab-abbs (TRUXIMA) 1,000 mg in sodium chloride (NS) 0.9 % 640 mL IVPB: 1000 mg | INTRAVENOUS | @ 14:00:00 | Stop: 2022-02-07

## 2022-02-07 MED ADMIN — diphenhydrAMINE (BENADRYL) injection 25 mg: 25 mg | INTRAVENOUS | @ 15:00:00 | Stop: 2022-02-07

## 2022-02-07 MED ADMIN — methylPREDNISolone sodium succinate (PF) (Solu-MEDROL) injection 125 mg: 125 mg | INTRAVENOUS | @ 13:00:00 | Stop: 2022-02-07

## 2022-02-07 MED ADMIN — acetaminophen (TYLENOL) tablet 650 mg: 650 mg | ORAL | @ 13:00:00 | Stop: 2022-02-07

## 2022-02-07 MED ADMIN — diphenhydrAMINE (BENADRYL) injection 25 mg: 25 mg | INTRAVENOUS | @ 13:00:00 | Stop: 2022-02-07

## 2022-02-07 NOTE — Unmapped (Signed)
4/11 - Contacted pt to schedule annual liver txp follow up, Updated pt on change in protocol, scheduled for same day labs and provider appt. Pt okay with letter in mail and verbalized understanding all discussed.

## 2022-02-07 NOTE — Unmapped (Signed)
0825 Patient arrived to the Transplant Infusion room for Rituximab 1000mg  infusion. Condition: well, Mobility: via wheelchair, accompanied by relative.   See Flowsheet and MAR for all details of visit.  1610 VS stable.   9604 PIV placed and secured with coban, labs collected and sent; urine collected and sent.  5409 Premedications given.  Today's lab results: WBC 5.2 (must be >2.0 or hold infusion and notify provider) and Platelets 392 (must be > 50 or hold infusion and notify provider).   8119 Rituximab infusion initiated rituximab: rituximab subsequent infusion protocol to start 100 mg/hr, rate increase by 100 mg/hr every 30 minutes as tolerated till maximum rate of 400 mg/hr. Patient educated to notify nursing staff if they experience urticaria, erythema, nausea, shortness of breath, metallic taste in mouth, excessive oral or postnasal drainage and any other changes in status which could be side effects from initiating this medication.  1116 Patient complaining of itchy throat and coughing. Rituximab infusion paused.  1126 Benadryl 25mg  IV given as ordered  1128 Famotidine 20mg  IV given as ordered  1205 Patient stated she feels better. Denies any itching at present. Rituximab infusion restarted at 400mg /hr  1350 Rituximab infusion completed, pt tolerated infusion with minor difficulties, see notes and Flowsheet for details..  1415 VS stable, PIV removed and site secured with coban. Patient left clinic today, condition: well; mobility: ambulating with cane; accompanied by relative.

## 2022-02-10 NOTE — Unmapped (Signed)
[facility] Refill Coordination Note    Specialty Medication(s) to be Shipped:   Transplant: mycophenolate mofetil 180mg     Other medication(s) to be shipped: No additional medications requested for fill at this time     Michelle Travis, DOB: 07-06-1956  Phone: [phone] (home)       All above HIPAA information was verified with patient.     Was a Nurse, learning disability used for this call? No    Completed refill call assessment today to schedule patient's medication shipment from the [facility] [phone]).  All relevant notes have been reviewed.     Specialty medication(s) and dose(s) confirmed: Regimen is correct and unchanged.   Changes to medications: Michelle Travis reports no changes at this time.  Changes to insurance: No  New side effects reported not previously addressed with a pharmacist or physician: None reported  Questions for the pharmacist: No    Confirmed patient received a Conservation officer, historic buildings and a Surveyor, mining with first shipment. The patient will receive a drug information handout for each medication shipped and additional FDA Medication Guides as required.       DISEASE/MEDICATION-SPECIFIC INFORMATION        N/A    SPECIALTY MEDICATION ADHERENCE     Medication Adherence    Patient reported X missed doses in the last month: 0  Specialty Medication: Mycophenolate 180mg   Patient is on additional specialty medications: No  Adherence tools used: patient uses a pill box to manage medications        Were doses missed due to medication being on hold? No    Mycophenolate 180 mg: 9 days of medicine on hand     REFERRAL TO PHARMACIST     Referral to the pharmacist: Not needed      [facility]     Shipping address confirmed in Epic.     Delivery Scheduled: Yes, Expected medication delivery date: 02/17/2022.     Medication will be delivered via UPS to the prescription address in Epic W[facility].    Michelle Travis [facility] Specialty Technician

## 2022-02-13 DIAGNOSIS — Z1159 Encounter for screening for other viral diseases: Principal | ICD-10-CM

## 2022-02-13 DIAGNOSIS — Z940 Kidney transplant status: Principal | ICD-10-CM

## 2022-02-13 DIAGNOSIS — Z79899 Other long term (current) drug therapy: Principal | ICD-10-CM

## 2022-02-15 DIAGNOSIS — Z944 Liver transplant status: Principal | ICD-10-CM

## 2022-02-15 DIAGNOSIS — Z940 Kidney transplant status: Principal | ICD-10-CM

## 2022-02-15 DIAGNOSIS — Z796 Long-term use of immunosuppressant medication: Principal | ICD-10-CM

## 2022-02-15 MED ORDER — ENVARSUS XR 1 MG TABLET,EXTENDED RELEASE
ORAL_TABLET | Freq: Every day | ORAL | 11 refills | 30 days | Status: CP
Start: 2022-02-15 — End: 2023-02-15

## 2022-02-15 NOTE — Unmapped (Signed)
Envarsus MAP renewal scripted to [facility].

## 2022-02-16 MED FILL — MYCOPHENOLATE SODIUM 180 MG TABLET,DELAYED RELEASE: ORAL | 30 days supply | Qty: 120 | Fill #3

## 2022-03-07 ENCOUNTER — Ambulatory Visit: Admit: 2022-03-07 | Discharge: 2022-03-08 | Payer: MEDICARE

## 2022-03-07 LAB — CBC W/ AUTO DIFF
BASOPHILS ABSOLUTE COUNT: 0.1 10*9/L (ref 0.0–0.1)
BASOPHILS RELATIVE PERCENT: 1.4 %
EOSINOPHILS ABSOLUTE COUNT: 0.1 10*9/L (ref 0.0–0.5)
EOSINOPHILS RELATIVE PERCENT: 2.7 %
HEMATOCRIT: 41.1 % (ref 34.0–44.0)
HEMOGLOBIN: 12.9 g/dL (ref 11.3–14.9)
LYMPHOCYTES ABSOLUTE COUNT: 1.4 10*9/L (ref 1.1–3.6)
LYMPHOCYTES RELATIVE PERCENT: 29.2 %
MEAN CORPUSCULAR HEMOGLOBIN CONC: 31.4 g/dL — ABNORMAL LOW (ref 32.0–36.0)
MEAN CORPUSCULAR HEMOGLOBIN: 25.5 pg — ABNORMAL LOW (ref 25.9–32.4)
MEAN CORPUSCULAR VOLUME: 81.3 fL (ref 77.6–95.7)
MEAN PLATELET VOLUME: 8.3 fL (ref 6.8–10.7)
MONOCYTES ABSOLUTE COUNT: 0.5 10*9/L (ref 0.3–0.8)
MONOCYTES RELATIVE PERCENT: 9.8 %
NEUTROPHILS ABSOLUTE COUNT: 2.8 10*9/L (ref 1.8–7.8)
NEUTROPHILS RELATIVE PERCENT: 56.9 %
PLATELET COUNT: 357 10*9/L (ref 150–450)
RED BLOOD CELL COUNT: 5.06 10*12/L (ref 3.95–5.13)
RED CELL DISTRIBUTION WIDTH: 15.8 % — ABNORMAL HIGH (ref 12.2–15.2)
WBC ADJUSTED: 4.9 10*9/L (ref 3.6–11.2)

## 2022-03-07 LAB — PROTEIN / CREATININE RATIO, URINE
CREATININE, URINE: 29.1 mg/dL
PROTEIN URINE: 179.1 mg/dL
PROTEIN/CREAT RATIO, URINE: 6.155

## 2022-03-07 MED ADMIN — methylPREDNISolone sodium succinate (PF) (SOLU-medrol) injection 125 mg: 125 mg | INTRAVENOUS | @ 14:00:00 | Stop: 2022-03-07

## 2022-03-07 MED ADMIN — acetaminophen (TYLENOL) tablet 650 mg: 650 mg | ORAL | @ 14:00:00 | Stop: 2022-03-07

## 2022-03-07 MED ADMIN — riTUXimab-abbs (TRUXIMA) 1,000 mg in sodium chloride (NS) 0.9 % 640 mL IVPB: 1000 mg | INTRAVENOUS | @ 15:00:00 | Stop: 2022-03-07

## 2022-03-07 MED ADMIN — diphenhydrAMINE (BENADRYL) injection 25 mg: 25 mg | INTRAVENOUS | @ 14:00:00 | Stop: 2022-03-07

## 2022-03-07 NOTE — Unmapped (Signed)
Pt presents for Rituxan abbs, which pt has had in the past.  Pt denies any recent infection, VSS.  IV started, labs drawn, premeds administered.  Pt aware of potential reaction/side effects, call bell within reach.  WBC 4.9  ANC 2.8  PLT 357  1040 Rituxan abbs 1000 mg started at the following rates:  100mg /hr for  200mg /hr for  300mg /hr for  400mg /hr until complete  1346 Infusion complete.  Pt tolerated without complication, VSS.  IV flushed per policy and d/c'd, gauze and coban applied.  Pt left clinic in no acute distress.

## 2022-03-13 DIAGNOSIS — Z1159 Encounter for screening for other viral diseases: Principal | ICD-10-CM

## 2022-03-13 DIAGNOSIS — Z79899 Other long term (current) drug therapy: Principal | ICD-10-CM

## 2022-03-13 DIAGNOSIS — Z940 Kidney transplant status: Principal | ICD-10-CM

## 2022-03-15 NOTE — Unmapped (Signed)
[facility] Clinical Assessment & Refill Coordination Note    Michelle Travis, DOB: [birth year]  Phone: [phone] (home)     All above HIPAA information was verified with patient.     Was a Nurse, learning disability used for this call? No    Specialty Medication(s):   Transplant:  mycophenolic acid 180mg      Current Outpatient Medications   Medication Sig Dispense Refill   ??? aspirin (ECOTRIN) 81 MG tablet Take 1 tablet (81 mg total) by mouth daily. 30 tablet 0   ??? atorvastatin (LIPITOR) 10 MG tablet Take 1 tablet (10 mg total) by mouth daily. 30 tablet 8   ??? BELBUCA 300 mcg buccal film Apply 1 each to cheek daily.     ??? cholecalciferol, vitamin D3, 1,000 unit (25 mcg) tablet Take 1,000 Units by mouth daily.     ??? ENVARSUS XR 1 mg Tb24 extended release tablet Take 2 tablets (2 mg total) by mouth in the morning. MAP Renewal. 60 tablet 11   ??? loratadine (CLARITIN) 10 mg tablet Take 10 mg by mouth daily. (Patient not taking: No sig reported)     ??? mycophenolate (MYFORTIC) 180 MG EC tablet Take 2 tablets (360 mg total) by mouth two (2) times a day. 120 tablet 11   ??? oxyCODONE (ROXICODONE) 10 mg immediate release tablet TAKE 1 (ONE) TABLET BY MOUTH FOUR TIMES DAILY, AS NEEDED     ??? pantoprazole (PROTONIX) 40 MG tablet Take 1 tablet (40 mg total) by mouth daily. 30 tablet 0     No current facility-administered medications for this visit.        Changes to medications: [name] reports no changes at this time.    Allergies   Allergen Reactions   ??? Latex Swelling and Rash     Discoloration of skin.   Discoloration of skin.    ??? Hydrocodone Itching   ??? Hydrocodone-Acetaminophen Itching       Changes to allergies: No    SPECIALTY MEDICATION ADHERENCE     Mycophenolate 180mg   : 7 days of medicine on hand       Medication Adherence    Patient reported X missed doses in the last month: 0  Specialty Medication: mycophenolate 180mg   Adherence tools used: patient uses a pill box to manage medications Specialty medication(s) dose(s) confirmed: Regimen is correct and unchanged.     Are there any concerns with adherence? No    Adherence counseling provided? Not needed    CLINICAL MANAGEMENT AND INTERVENTION      Clinical Benefit Assessment:    Do you feel the medicine is effective or helping your condition? Yes    Clinical Benefit counseling provided? Not needed    Adverse Effects Assessment:    Are you experiencing any side effects? No    Are you experiencing difficulty administering your medicine? No    Quality of Life Assessment:         How many days over the past month did your transplant  keep you from your normal activities? For example, brushing your teeth or getting up in the morning. 0    Have you discussed this with your provider? Not needed    Acute Infection Status:    Acute infections noted within Epic:  No active infections  Patient reported infection: None    Therapy Appropriateness:    Is therapy appropriate and patient progressing towards therapeutic goals? Yes, therapy is appropriate and should be continued  DISEASE/MEDICATION-SPECIFIC INFORMATION      N/A    PATIENT SPECIFIC NEEDS     - Does the patient have any physical, cognitive, or cultural barriers? No    - Is the patient high risk? Yes, patient is taking a REMS drug. Medication is dispensed in compliance with REMS program    - Does the patient require a Care Management Plan? No     SOCIAL DETERMINANTS OF HEALTH     At the [facility], we have learned that life circumstances - like trouble affording food, housing, utilities, or transportation can affect the health of many of our patients.   That is why we wanted to ask: are you currently experiencing any life circumstances that are negatively impacting your health and/or quality of life? No    Social Determinants of Psychologist, prison and probation services Strain: Not on file   Internet Connectivity: Not on file   Food Insecurity: Not on file   Tobacco Use: Low Risk    ??? Smoking Tobacco Use: Never   ??? Smokeless Tobacco Use: Never   ??? Passive Exposure: Not on file   Housing/Utilities: Not on file   Alcohol Use: Not on file   Transportation Needs: Not on file   Substance Use: Not on file   Health Literacy: Not on file   Physical Activity: Not on file   Interpersonal Safety: Not on file   Stress: Not on file   Intimate Partner Violence: Not on file   Depression: Not on file   Social Connections: Not on file       Would you be willing to receive help with any of the needs that you have identified today? Not applicable       SHIPPING     Specialty Medication(s) to be Shipped:   Transplant:  mycophenolic acid 180mg     Other medication(s) to be shipped: No additional medications requested for fill at this time     Changes to insurance: No    Delivery Scheduled: Yes, Expected medication delivery date: 03/17/2022.     Medication will be delivered via UPS to the confirmed prescription address in [facility].    The patient will receive a drug information handout for each medication shipped and additional FDA Medication Guides as required.  Verified that patient has previously received a Conservation officer, historic buildings and a Surveyor, mining.    The patient or caregiver noted above participated in the development of this care plan and knows that they can request review of or adjustments to the care plan at any time.      All of the patient's questions and concerns have been addressed.    [name]   [facility] Specialty Pharmacist

## 2022-03-16 MED FILL — MYCOPHENOLATE SODIUM 180 MG TABLET,DELAYED RELEASE: ORAL | 30 days supply | Qty: 120 | Fill #4

## 2022-03-20 ENCOUNTER — Other Ambulatory Visit (HOSPITAL_COMMUNITY): Payer: Self-pay | Admitting: Internal Medicine

## 2022-03-20 DIAGNOSIS — Z1231 Encounter for screening mammogram for malignant neoplasm of breast: Secondary | ICD-10-CM

## 2022-03-21 NOTE — Unmapped (Signed)
Patient has received a call to confirm an appointment that has been request by the provider.. The patient as agreed to the date/time provided by the scheduler.  04/17/22 at 930 labs and 1000 am Dr. Sherryll Burger request to move patinet

## 2022-03-24 ENCOUNTER — Ambulatory Visit: Admit: 2022-03-24 | Discharge: 2022-03-25 | Payer: MEDICARE

## 2022-03-24 ENCOUNTER — Ambulatory Visit: Admit: 2022-03-24 | Discharge: 2022-03-25 | Payer: MEDICARE | Attending: Nephrology | Primary: Nephrology

## 2022-03-24 DIAGNOSIS — Z940 Kidney transplant status: Principal | ICD-10-CM

## 2022-03-24 DIAGNOSIS — Z79899 Other long term (current) drug therapy: Principal | ICD-10-CM

## 2022-03-24 LAB — URINALYSIS
BILIRUBIN UA: NEGATIVE
GLUCOSE UA: NEGATIVE
KETONES UA: NEGATIVE
LEUKOCYTE ESTERASE UA: NEGATIVE
NITRITE UA: NEGATIVE
PH UA: 5.5 (ref 5.0–9.0)
PROTEIN UA: >300 — AB
RBC UA: 4 /HPF — ABNORMAL HIGH (ref <4)
SPECIFIC GRAVITY UA: >=1.03 (ref 1.005–1.030)
SQUAMOUS EPITHELIAL: 15 /HPF — ABNORMAL HIGH (ref 0–5)
TRANSITIONAL EPITHELIAL: 2 /HPF (ref 0–2)
UROBILINOGEN UA: 1
WBC UA: 4 /HPF (ref 0–5)

## 2022-03-24 LAB — CBC W/ AUTO DIFF
BASOPHILS ABSOLUTE COUNT: 0.1 10*9/L (ref 0.0–0.1)
BASOPHILS RELATIVE PERCENT: 1.3 %
EOSINOPHILS ABSOLUTE COUNT: 0.1 10*9/L (ref 0.0–0.5)
EOSINOPHILS RELATIVE PERCENT: 3.1 %
HEMATOCRIT: 41.4 % (ref 34.0–44.0)
HEMOGLOBIN: 13.1 g/dL (ref 11.3–14.9)
LYMPHOCYTES ABSOLUTE COUNT: 1.5 10*9/L (ref 1.1–3.6)
LYMPHOCYTES RELATIVE PERCENT: 33.5 %
MEAN CORPUSCULAR HEMOGLOBIN CONC: 31.6 g/dL — ABNORMAL LOW (ref 32.0–36.0)
MEAN CORPUSCULAR HEMOGLOBIN: 26 pg (ref 25.9–32.4)
MEAN CORPUSCULAR VOLUME: 82.3 fL (ref 77.6–95.7)
MEAN PLATELET VOLUME: 8.9 fL (ref 6.8–10.7)
MONOCYTES ABSOLUTE COUNT: 0.4 10*9/L (ref 0.3–0.8)
MONOCYTES RELATIVE PERCENT: 10 %
NEUTROPHILS ABSOLUTE COUNT: 2.3 10*9/L (ref 1.8–7.8)
NEUTROPHILS RELATIVE PERCENT: 52.1 %
PLATELET COUNT: 305 10*9/L (ref 150–450)
RED BLOOD CELL COUNT: 5.03 10*12/L (ref 3.95–5.13)
RED CELL DISTRIBUTION WIDTH: 15.7 % — ABNORMAL HIGH (ref 12.2–15.2)
WBC ADJUSTED: 4.4 10*9/L (ref 3.6–11.2)

## 2022-03-24 LAB — COMPREHENSIVE METABOLIC PANEL
ALBUMIN: 3.1 g/dL — ABNORMAL LOW (ref 3.4–5.0)
ALKALINE PHOSPHATASE: 61 U/L (ref 46–116)
ALT (SGPT): 9 U/L — ABNORMAL LOW (ref 10–49)
ANION GAP: 6 mmol/L (ref 5–14)
AST (SGOT): 18 U/L (ref <=34)
BILIRUBIN TOTAL: 0.4 mg/dL (ref 0.3–1.2)
BLOOD UREA NITROGEN: 11 mg/dL (ref 9–23)
BUN / CREAT RATIO: 20
CALCIUM: 9 mg/dL (ref 8.7–10.4)
CHLORIDE: 108 mmol/L — ABNORMAL HIGH (ref 98–107)
CO2: 29.3 mmol/L (ref 20.0–31.0)
CREATININE: 0.54 mg/dL — ABNORMAL LOW
EGFR CKD-EPI (2021) FEMALE: >90 mL/min/{1.73_m2} (ref >=60)
GLUCOSE RANDOM: 74 mg/dL (ref 70–179)
POTASSIUM: 3.9 mmol/L (ref 3.4–4.8)
PROTEIN TOTAL: 5.8 g/dL (ref 5.7–8.2)
SODIUM: 143 mmol/L (ref 135–145)

## 2022-03-24 LAB — SLIDE REVIEW

## 2022-03-24 LAB — PHOSPHORUS: PHOSPHORUS: 3.3 mg/dL (ref 2.4–5.1)

## 2022-03-24 LAB — PROTEIN / CREATININE RATIO, URINE
CREATININE, URINE: 181.1 mg/dL
PROTEIN URINE: 1228.3 mg/dL
PROTEIN/CREAT RATIO, URINE: 6.782

## 2022-03-24 LAB — TACROLIMUS LEVEL, TROUGH: TACROLIMUS, TROUGH: 4.7 ng/mL — ABNORMAL LOW (ref 5.0–15.0)

## 2022-03-24 LAB — PARATHYROID HORMONE (PTH): PARATHYROID HORMONE INTACT: 106 pg/mL — ABNORMAL HIGH (ref 18.4–80.1)

## 2022-03-24 LAB — MAGNESIUM: MAGNESIUM: 1.5 mg/dL — ABNORMAL LOW (ref 1.6–2.6)

## 2022-03-24 NOTE — Unmapped (Signed)
Per provider, Moderna COVID bv booster  vaccine(s) ordered.  Patient ID verified.  All screening questions were answered.  Vaccine(s) were administered as ordered.  See immunization history for documentation.  Patient tolerated the injection(s) well with no complications.  Vaccine Information sheet given to the patient.

## 2022-03-24 NOTE — Unmapped (Signed)
AOBP:     Right    arm        Medium       cuff     Average :    135/87            Pulse: 53    1st reading:  139/91           Pulse: 54    2nd reading:   140/85         Pulse: 53    3rd reading:    125/85         Pulse: 52

## 2022-03-24 NOTE — Unmapped (Signed)
Transplant Nephrology Clinic Visit    Assessment and Plan  Michelle Travis is a 66 y.o. 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 She has recurrent PLA2R positive membranous nephropathy and is seen for follow up of her transplant and associated medical problems. Issues addressed today include:    Status post kidney transplant with membranous nephropathy recurrence.  - Kidney biopsy 03/11/21 with PLA2R positive membranous nephropathy  - Treatment included Rituximab 1 gm on 03/23/21 and 1 gm on 04/07/21 and 125 mg solumedrol x 2 given with the doses of Rituximab.   - Recurrence of nephrotic range proteinuria treated with Rituximab 1 gm 02/07/22/and 03/07/22.   - UPC after biopsy peaked at 5.02 on 03/23/21 then improved to 0.623 on 07/20/21 only to return to nephrotic range recently. UPC today is 6.78 despite 2 recent doses of Rituximab. It may be too early to see Rituximab effect on proteinuria.     - Serum creatinine has remained stable with value today of 0.54 mg/dL (baseline < 0.8 mg/dL)  - Will hold on new therapy until her response to recent Rituximab doses is confirmed.   - Patient at high risk of DVT in view of membranous nephropathy recurrence, albumin level 3.1  Exam does not suggest DVT. I have educated her about the signs/symptoms of DVT/PE. Will continue aspirin.     S/P Liver transplant with excellent liver function.   - LFTs normal today  - She will continue to follow with the liver transplant team.     Immunosuppression.   - Tacrolimus level is 4.7 (target 3-6).   - Patient will continue Envarsus 2 mg daily and Myfortic 360 mg BID.     S/P right total hip replacement  - She will follow up with ortho clinic.    Bilateral knee pain.   - I have suggested planned possible TKR be delayed until the response to membranous recurrence treatment can be fully evaluated. This is predominantly related to concern about post-op DVT.  Will follow up with ortho clinic.     History of IgG lambda monoclonal gammopathy.   - SPEP with immunofixation 03/19/20 revealed no monoclonal protein. Kidney biopsy revealed no evidence of MGRS.    History of sleep apnea.   - She is no longer on CPAP.  - She denies daytime drowsiness.     Immunizations.  -  Prevnar-20 07/20/21.  - Flu vaccine 07/20/21   - Shingrix x 2 completed  - COVID-19  bivalent booster doses completed 09/07/21 and 03/24/22. She is aware of the limited efficacy of the vaccine.     Cancer screening.   - Mammography 05/25/21 negative, has remote history of breast cancer.  - PAP smear last year was unremarkable.  - Colonoscopy 08/07/18 with single 6 mm polyp and diverticulosis.     Follow-up.   Patient will return to clinic in 2 months.   Monthly labs including UPC.    History of Present Illness  Michelle Travis is a 66 y.o. 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  She was found to have post-transplant proteinuria (UPC 2.31 and 5.02) and a kidney biopsy 03/11/21 revealed recurrent membranous nephropathy. She received 2 doses of Rituximab 1 gm on 03/23/21 and 04/07/21 with subsequent improvement in proteinuria to 0.623 on 07/20/21. UPC increased to 6.94 on 01/27/22 and she was treated with 1 gm rituximab again on 02/07/22 and 03/07/22. UPC today remains nephrotic  at 6.78.     She denies LE edema, leg pain, shortness of breath, chest pain, hemoptysis, or cough. She denies dysuria, gross hematuria, allograft tenderness, fever or chills. She denies headache, tremors, nausea, vomiting or diarrhea. She underwent right total hip replacement 12/09/21 and now has left knee pain with plans for possible knee surgery.      Immunosuppression is unchanged at Envarsus 2 mg daily and Myfortic 360 mg bid.     Transplant History:  1. 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 S/P kidney and liver transplants on 09/13/17 at [facility]. Kidney KDPI 26%. CMV D+/R+; EBV D-/R+, hep C Ab +/NAT- (recipient hep C Ab negative)  4. Complicated by rectus sheath hematoma  5. Delayed graft function requiring hemodialysis sessions following transplant (stopped dialysis 11/26/018).  6. Immunosuppression was ??? induction followed by tacrolimus and myfortic maintenance therapy.   7. Baseline creatinine <1.0 mg/dL.  8. Kidney biopsy 03/11/21 with recurrent PLA2R positive membranous nephropathy, treated with Rituximab 1 gm x 4 (03/23/21, 04/07/21, 02/07/22, 03/07/22)     Past Medical History:  1. Alcoholic cirrhosis, S/P TIPS  2. Membranous nephropathy, PLA2R positive biopsy 08/30/2016, treated solumedrol initially with little improvement then with Rituximab (10/2016 and 03/2017).   3. S/P kidney and liver transplants as stated above  4. History of breast cancer, in remission for 17 years, s/p lumpectomy.  5. History of IGG monoclonal gammopathy, IgG lambda. Kidney biopsy 08/30/2016 without MGRS.  6. S/p cerebral aneurysm repair.  7. S/P total knee replacement, right   8. Essential HTN prior to ESLD  9. History of diverticulitis  10. Recurrent pleural effusion, history of pneumonia x 3  11. Osteopenia by DEXA 06/14/2016  12. Hypoattenuating thyroid nodule noted on chest CT 11/11/2016  13. Sleep apnea    Review of Systems    All other systems are reviewed and are negative.    Medications    Current Outpatient Medications   Medication Sig Dispense Refill   ??? acetaminophen (TYLENOL) 500 MG tablet Take 1 tablet (500 mg total) by mouth every six (6) hours as needed for pain.     ??? atorvastatin (LIPITOR) 10 MG tablet Take 1 tablet (10 mg total) by mouth daily. 30 tablet 8   ??? BELBUCA 300 mcg buccal film Apply 1 Film (300 mcg total) to cheek in the morning.     ??? cholecalciferol, vitamin D3, 1,000 unit (25 mcg) tablet Take 1 tablet (25 mcg total) by mouth in the morning.     ??? ENVARSUS XR 1 mg Tb24 extended release tablet Take 2 tablets (2 mg total) by mouth in the morning. MAP Renewal. 60 tablet 11   ??? mycophenolate (MYFORTIC) 180 MG EC tablet Take 2 tablets (360 mg total) by mouth two (2) times a day. 120 tablet 11   ??? oxyCODONE (ROXICODONE) 10 mg immediate release tablet TAKE 1 (ONE) TABLET BY MOUTH FOUR TIMES DAILY, AS NEEDED     ??? pantoprazole (PROTONIX) 40 MG tablet Take 1 tablet (40 mg total) by mouth daily. 30 tablet 0   ??? aspirin (ECOTRIN) 81 MG tablet Take 1 tablet (81 mg total) by mouth daily. (Patient not taking: Reported on 03/24/2022) 30 tablet 0   ??? loratadine (CLARITIN) 10 mg tablet Take 10 mg by mouth daily. (Patient not taking: Reported on 04/27/2021)       No current facility-administered medications for this visit.  Physical Exam    BP 135/87 (BP Site: R Arm, BP Position: Sitting, BP Cuff Size: Medium)  - Pulse 53  - Ht 162.6 cm (5' 4)  - Wt 82.5 kg (181 lb 12.8 oz)  - LMP  (LMP Unknown)  - BMI 31.21 kg/m??   General: Patient is a pleasant female in no apparent distress.  Eyes: Sclera anicteric.  Neck: Supple without LAD/JVD/bruits.  Lungs: Clear to auscultation bilaterally, no wheezes/rales/rhonchi.  Cardiovascular: Regular rate and rhythm without murmurs, rubs or gallops.  Abdomen: Soft, notender/nondistended. Positive bowel sounds. No hepatosplenomegaly, masses or bruits appreciated.   Extremities: trace bilateral edema  Skin: Without rash  Neurological: Grossly nonfocal.  Psychiatric: Mood and affect appropriate.    Laboratory Results and Imaging Reviewed

## 2022-03-25 LAB — CMV DNA, QUANTITATIVE, PCR: CMV VIRAL LD: NOT DETECTED

## 2022-03-28 LAB — BK VIRUS QUANTITATIVE PCR, BLOOD: BK BLOOD RESULT: NOT DETECTED

## 2022-03-30 LAB — VITAMIN D 25 HYDROXY: VITAMIN D, TOTAL (25OH): 77.2 ng/mL (ref 20.0–80.0)

## 2022-04-03 DIAGNOSIS — Z940 Kidney transplant status: Principal | ICD-10-CM

## 2022-04-03 NOTE — Unmapped (Signed)
[facility] Refill Coordination Note    Specialty Medication(s) to be Shipped:   Transplant: mycophenolate mofetil 180mg     Other medication(s) to be shipped: No additional medications requested for fill at this time     Michelle Travis, DOB: [birth year]  Phone: [phone] (home)       All above HIPAA information was verified with patient.     Was a Nurse, learning disability used for this call? No    Completed refill call assessment today to schedule patient's medication shipment from the [facility] [phone]).  All relevant notes have been reviewed.     Specialty medication(s) and dose(s) confirmed: Regimen is correct and unchanged.   Changes to medications: [name] reports no changes at this time.  Changes to insurance: No  New side effects reported not previously addressed with a pharmacist or physician: None reported  Questions for the pharmacist: No    Confirmed patient received a Conservation officer, historic buildings and a Surveyor, mining with first shipment. The patient will receive a drug information handout for each medication shipped and additional FDA Medication Guides as required.       DISEASE/MEDICATION-SPECIFIC INFORMATION        N/A    SPECIALTY MEDICATION ADHERENCE     Medication Adherence    Patient reported X missed doses in the last month: 0  Specialty Medication: Mycophenolate 180mg   Patient is on additional specialty medications: No  Adherence tools used: patient uses a pill box to manage medications        Were doses missed due to medication being on hold? No    Mycophenolate 180 mg: 12 days of medicine on hand     REFERRAL TO PHARMACIST     Referral to the pharmacist: Not needed      [facility]     Shipping address confirmed in Epic.     Delivery Scheduled: Yes, Expected medication delivery date: 04/14/2022.     Medication will be delivered via UPS to the prescription address in Epic WAM.    [name] [facility] Specialty Technician

## 2022-04-05 LAB — HLA DS POST TRANSPLANT
ANTI-DONOR HLA-A #1 MFI: 0 MFI
ANTI-DONOR HLA-A #2 MFI: 0 MFI
ANTI-DONOR HLA-B #1 MFI: 0 MFI
ANTI-DONOR HLA-B #2 MFI: 0 MFI
ANTI-DONOR HLA-C #1 MFI: 0 MFI
ANTI-DONOR HLA-DP #2 MFI: 66 MFI
ANTI-DONOR HLA-DQB #1 MFI: 0 MFI
ANTI-DONOR HLA-DQB #2 MFI: 0 MFI
ANTI-DONOR HLA-DR #1 MFI: 53 MFI
ANTI-DONOR HLA-DR #2 MFI: 0 MFI

## 2022-04-05 LAB — FSAB CLASS 1 ANTIBODY SPECIFICITY: HLA CLASS 1 ANTIBODY RESULT: NEGATIVE

## 2022-04-05 LAB — FSAB CLASS 2 ANTIBODY SPECIFICITY: HLA CL2 AB RESULT: NEGATIVE

## 2022-04-10 DIAGNOSIS — Z940 Kidney transplant status: Principal | ICD-10-CM

## 2022-04-10 DIAGNOSIS — Z79899 Other long term (current) drug therapy: Principal | ICD-10-CM

## 2022-04-10 DIAGNOSIS — Z1159 Encounter for screening for other viral diseases: Principal | ICD-10-CM

## 2022-04-13 MED FILL — MYCOPHENOLATE SODIUM 180 MG TABLET,DELAYED RELEASE: ORAL | 30 days supply | Qty: 120 | Fill #5

## 2022-04-16 NOTE — Unmapped (Signed)
[facility]  [facility]  [address] [place]  [place], [place]  [address]  Ph: [phone]  Fax: [phone]     TRANSPLANT HEPATOLOGY FOLLOW UP CLINIC NOTE    PRIMARY CARE PROVIDER: Consuella Lose, MD    DATE OF SERVICE: 04/17/2022    ASSESSMENT AND PLAN:   Michelle Travis is a 66 y.o. 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 She received a HCV Ab+/NAT- organ and has not developed chronic HCV in the post-transplant period. She does have recurrent proteinuria in her graft.    1. Immunosuppression: Envarsus 2 mg daily, Myfortic 360 mg BID  -Tac goal 3-5  -Repeat labs q3 months, but will defer to nephrology team if they want to do it more often    2. Donor liver Hep C Ab+/NAT: HCV VL remains negative    3. Chronic opioids for knee pain, interested in knee replacement, but on hold until her nephrotic syndrome is under more control.   -Oxycodone 10 mg QID + Belbuca 300 mcg film tab daily prescribed by Dr Mikael Spray     4. Metabolic syndrome     -HLD: Continue atorvastatin 10 mg daily  -HTN: No issues  -insulin resistance: A1c 5.9% on last check, repeat now    5. 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 Agree with ASA for thrombosis risk, would be okay with short-term anticoagulation after knee replacement if needed in the future    Preventative Health:   - Bone Health:  DEXA scan done 08/2018 was normal. Repeat due now (2 years)  - Dermatology: This patient is at increased risk for the development of skin cancers due to immunosuppressant medications. We recommend yearly surveillance. The patient has been informed of the same. Advised to schedule appointment with Dermatologist  - CRC screening: Colonoscopy done 08/07/2018 and showed one small adenoma. Next screening  In 7 years.  -Women's Health: Mammogram and PAP smear are up to date per patient report    Vaccinations: We recommend that patients have vaccinations to prevent various infections that can occur, especially in the setting of having underlying liver disease. The following vaccinations should be given:  -Hepatitis A: HAV IgG+  -Hepatitis B: Vaccinated previously  -Influenza (yearly): UTD  -Pneumococcal: PPSV23 and PCV13 UTD  -Zoster: Shingrix (age > 61) UTD  -SARS-CoV-2: Moderna x 4, including bivalent x 2    Return to clinic in 1 year.         Georga Hacking Sherryll Burger, MD  Assistant Professor of Medicine   [facility] [place] [facility]  Division of Gastroenterology & Hepatology  p: [phone] - f: [phone]  [email or web address]    Subjective      CHIEF COMPLAINT:  Transplant follow up    HISTORY OF PRESENT ILLNESS:      Michelle Travis is a 66 y.o. 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 No history of graft rejection.    INTERVAL HISTORY:(since the time of the patient's last visit):    The patient was last seen on 02/28/21. In the interim, she underwent R THA with no complications. She presents today with her daughter. Overall she is doing well. She still wants to get a left knee replacement, but her nephrologist told  her to hold off on this given the high thrombotic risk with her ongoing nephrotic syndrome.  Otherwise she is doing well with no complaints.    MEDICATIONS:   Current Outpatient Medications   Medication Instructions    acetaminophen (TYLENOL) 500 mg, Oral, Every 6 hours PRN    aspirin 81 mg, Oral, Daily (standard)    atorvastatin (LIPITOR) 10 mg, Oral, Daily (standard)    BELBUCA 300 mcg buccal film 1 each, Buccal, Daily    cholecalciferol (vitamin D3 25 mcg (1,000 units)) 1,000 Units, Oral, Daily    ENVARSUS XR 2 mg, Oral, Daily, MAP Renewal mycophenolate (MYFORTIC) 360 mg, Oral, 2 times a day    oxyCODONE (ROXICODONE) 10 mg immediate release tablet TAKE 1 (ONE) TABLET BY MOUTH FOUR TIMES DAILY, AS NEEDED    pantoprazole (PROTONIX) 40 mg, Oral, Daily (standard)       Objective    VITAL SIGNS: BP 118/83  - Pulse 62  - Temp 37.1 ??C (98.7 ??F) (Tympanic)  - Ht 162.6 cm (5' 4.02)  - Wt 80.3 kg (177 lb)  - LMP  (LMP Unknown)  - SpO2 98%  - BMI 30.37 kg/m??   Body mass index is 30.37 kg/m??.  Wt Readings from Last 3 Encounters:   04/17/22 80.3 kg (177 lb)   03/24/22 82.5 kg (181 lb 12.8 oz)   03/07/22 82.7 kg (182 lb 6.4 oz)       PHYSICAL EXAM:  Constitutional: Alert, Oriented x 3, No acute distress  HEENT: PERRL, conjunctiva clear, anicteric  CV: Regular rate and rhythm  Lung: Clear to auscultation bilaterally. Unlabored breathing.   Abdomen: soft, obese, non-tender. No organomegaly. No ascites.   Extremities: No edema, well perfused  MSK: No joint swelling or tenderness noted, no deformities  Skin: No rashes, jaundice or skin lesions noted  Neuro: No focal deficits. No asterixis.   Mental Status: Thought organized, appropriate affect    DIAGNOSTIC STUDIES:  I have reviewed all pertinent diagnostic studies, including:  Laboratory results:  Lab Results   Component Value Date    WBC 6.2 04/17/2022    HGB 12.7 04/17/2022    MCV 81.9 04/17/2022    PLT 344 04/17/2022    Lab Results   Component Value Date    NA 143 03/24/2022    K 3.9 03/24/2022    CL 108 (H) 03/24/2022    CREATININE 0.54 (L) 03/24/2022    GLU 74 03/24/2022    CALCIUM 9.0 03/24/2022      Lab Results   Component Value Date    ALBUMIN 3.1 (L) 03/24/2022    AST 18 03/24/2022    ALT 9 (L) 03/24/2022    ALKPHOS 61 03/24/2022    BILITOT 0.4 03/24/2022    Lab Results   Component Value Date    INR 0.91 03/07/2021          Explant Data 09/13/2017:  A: Liver, native, explantation  - Hepatic cirrhosis (liver weight 870 g), clinically alcoholic cirrhosis.  - PAS-D stain positive for abnormal intracytoplasmic PAS-D positive inclusions (See Comment).  - Reticulin and trichrome stains confirmatory highlighting extensive hepatic fibrosis and cirrhosis.  - Iron stain negative for increased hepatocellular iron deposition.  - Negative for malignancy including the examined resection margins.  - Minimal steatosis involving approximately <1% of liver parenchyma.

## 2022-04-17 ENCOUNTER — Ambulatory Visit: Admit: 2022-04-17 | Discharge: 2022-04-18 | Payer: MEDICARE

## 2022-04-17 DIAGNOSIS — Z796 Long-term use of immunosuppressant medication: Principal | ICD-10-CM

## 2022-04-17 DIAGNOSIS — Z8639 Personal history of other endocrine, nutritional and metabolic disease: Principal | ICD-10-CM

## 2022-04-17 DIAGNOSIS — Z944 Liver transplant status: Principal | ICD-10-CM

## 2022-04-17 LAB — CBC W/ AUTO DIFF
BASOPHILS ABSOLUTE COUNT: 0.1 10*9/L (ref 0.0–0.1)
BASOPHILS RELATIVE PERCENT: 1.9 %
EOSINOPHILS ABSOLUTE COUNT: 0.4 10*9/L (ref 0.0–0.5)
EOSINOPHILS RELATIVE PERCENT: 6.7 %
HEMATOCRIT: 39.4 % (ref 34.0–44.0)
HEMOGLOBIN: 12.7 g/dL (ref 11.3–14.9)
LYMPHOCYTES ABSOLUTE COUNT: 2 10*9/L (ref 1.1–3.6)
LYMPHOCYTES RELATIVE PERCENT: 32.5 %
MEAN CORPUSCULAR HEMOGLOBIN CONC: 32.3 g/dL (ref 32.0–36.0)
MEAN CORPUSCULAR HEMOGLOBIN: 26.5 pg (ref 25.9–32.4)
MEAN CORPUSCULAR VOLUME: 81.9 fL (ref 77.6–95.7)
MEAN PLATELET VOLUME: 8.8 fL (ref 6.8–10.7)
MONOCYTES ABSOLUTE COUNT: 0.7 10*9/L (ref 0.3–0.8)
MONOCYTES RELATIVE PERCENT: 11.5 %
NEUTROPHILS ABSOLUTE COUNT: 3 10*9/L (ref 1.8–7.8)
NEUTROPHILS RELATIVE PERCENT: 47.4 %
PLATELET COUNT: 344 10*9/L (ref 150–450)
RED BLOOD CELL COUNT: 4.81 10*12/L (ref 3.95–5.13)
RED CELL DISTRIBUTION WIDTH: 15.2 % (ref 12.2–15.2)
WBC ADJUSTED: 6.2 10*9/L (ref 3.6–11.2)

## 2022-04-17 LAB — BILIRUBIN, DIRECT: BILIRUBIN DIRECT: 0.2 mg/dL (ref 0.00–0.30)

## 2022-04-17 LAB — MAGNESIUM: MAGNESIUM: 1.5 mg/dL — ABNORMAL LOW (ref 1.6–2.6)

## 2022-04-17 LAB — COMPREHENSIVE METABOLIC PANEL
ALBUMIN: 3.3 g/dL — ABNORMAL LOW (ref 3.4–5.0)
ALKALINE PHOSPHATASE: 60 U/L (ref 46–116)
ALT (SGPT): 13 U/L (ref 10–49)
ANION GAP: 5 mmol/L (ref 5–14)
AST (SGOT): 15 U/L (ref <=34)
BILIRUBIN TOTAL: 0.5 mg/dL (ref 0.3–1.2)
BLOOD UREA NITROGEN: 14 mg/dL (ref 9–23)
BUN / CREAT RATIO: 27
CALCIUM: 8.5 mg/dL — ABNORMAL LOW (ref 8.7–10.4)
CHLORIDE: 111 mmol/L — ABNORMAL HIGH (ref 98–107)
CO2: 26 mmol/L (ref 20.0–31.0)
CREATININE: 0.52 mg/dL — ABNORMAL LOW
EGFR CKD-EPI (2021) FEMALE: >90 mL/min/{1.73_m2} (ref >=60)
GLUCOSE RANDOM: 101 mg/dL (ref 70–179)
POTASSIUM: 3.9 mmol/L (ref 3.4–4.8)
PROTEIN TOTAL: 6 g/dL (ref 5.7–8.2)
SODIUM: 142 mmol/L (ref 135–145)

## 2022-04-17 LAB — GAMMA GT: GAMMA GLUTAMYL TRANSFERASE: 29 U/L

## 2022-04-17 LAB — HEMOGLOBIN A1C
ESTIMATED AVERAGE GLUCOSE: 111 mg/dL
HEMOGLOBIN A1C: 5.5 % (ref 4.8–5.6)

## 2022-04-17 LAB — TACROLIMUS LEVEL, TROUGH: TACROLIMUS, TROUGH: 3 ng/mL — ABNORMAL LOW (ref 5.0–15.0)

## 2022-04-17 LAB — PHOSPHORUS: PHOSPHORUS: 3.2 mg/dL (ref 2.4–5.1)

## 2022-04-17 NOTE — Unmapped (Signed)
Post Liver Transplant Follow-Up    Transplant Date: 09/13/2017 (Liver), 09/13/2017 (Kidney)    Called pt to prep for post liver txp follow up scheduled with Dr. Sherryll Burger today as this TNC unaware of pt being rescheduled from July. Pt reported that she is doing well aside from kidney issues - membranous nephropathy recurrence w/ rituximab therapy x3.  No symptoms aside from bubbles in urine. Pt had R hip replacement done earlier this year, plans to have knee surgery in the future.  Labs obtained at [facility], IS refilled via [facility] pharmacy. Medication list reconciled and reviewed. Vaccines & screenings up to date.

## 2022-04-20 ENCOUNTER — Ambulatory Visit (INDEPENDENT_AMBULATORY_CARE_PROVIDER_SITE_OTHER): Payer: Medicare HMO | Admitting: Orthopaedic Surgery

## 2022-04-20 ENCOUNTER — Ambulatory Visit (INDEPENDENT_AMBULATORY_CARE_PROVIDER_SITE_OTHER): Payer: Medicare HMO

## 2022-04-20 ENCOUNTER — Encounter: Payer: Self-pay | Admitting: Orthopaedic Surgery

## 2022-04-20 DIAGNOSIS — G8929 Other chronic pain: Secondary | ICD-10-CM | POA: Diagnosis not present

## 2022-04-20 DIAGNOSIS — Z96641 Presence of right artificial hip joint: Secondary | ICD-10-CM | POA: Diagnosis not present

## 2022-04-20 DIAGNOSIS — M25562 Pain in left knee: Secondary | ICD-10-CM

## 2022-04-20 DIAGNOSIS — M1712 Unilateral primary osteoarthritis, left knee: Secondary | ICD-10-CM | POA: Diagnosis not present

## 2022-04-20 NOTE — Progress Notes (Signed)
The patient is just over 4 months status post a right total hip arthroplasty to treat severe osteoarthritis of the right hip.  We are seeing him today for postoperative x-rays to see how the hip is doing overall.  She has been having some chronic left knee pain and issues with that knee.  We have not x-rayed the left knee so our plan is to x-ray the left knee today as well.  She reports that she is having no issues with her right hip replacement.  He has a remote history of a right knee replacement done in 2002.  She also has a history of a kidney and liver transplant.  She is walking now without assistive device.  She says her right operative hip is doing wonderful.  Her left knee has severe pain on a daily basis.  X-rays of the left knee today show complete loss of joint space with bone-on-bone arthritis of all 3 compartments.  There is severe joint space narrowing and osteophytes in all 3 compartments.  An AP pelvis and lateral of her right operative hip shows a well-seated implant with no complicating features.  Her right operative hip moves smoothly and fluidly.  The left knee has varus malalignment and significant pain with flexion extension and significant joint line tenderness.  At this point I am happy to set her up for a knee replacement surgery when she would like to.  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  I feel that her benefits outweigh risks we will do everything we can to prevent those types of things from happening even though they can still happen with a high risk of blood clot and DVT from a knee replacement then hip replacement.  She will let us know if she would like to have her left knee scheduled for a total knee arthroplasty.  She has dealt with pain in this knee for over  several years now and has failed all forms of conservative treatment.  Follow-up for the right hip is as needed.  All questions and concerns were answered and addressed.

## 2022-04-21 NOTE — Unmapped (Signed)
Transplant Coordinator, Clinic Visit   Patient seen today by transplant nephrologist for follow up, reviewed medications and symptoms.     Assessment  BP:  135/87  Pain.: 10/10 chronic knee pain  Constitutional: Negative for fever, chills, night sweats, unintentional weight loss, loss of appetite.  HEAD: Negative for headache or dizziness  Eyes: Negative for visual changes.  ENT: Negative for sore throat.  Cardiovascular: Negative for chest pain, pressure, or palpitations  Respiratory: Negative for shortness of breath.  Gastrointestinal: Negative for abdominal pain, nausea, vomiting, diarrhea or constipation.  Genitourinary: Negative for dysuria, polyuria,  hematuria, urgency, hesitancy, or ordor  Musculoskeletal: Negative for unilateral leg swelling  Skin/Hair: Negative for rash, itching, or hair loss  Neurological: Negative for numbness, tingling or hand tremors.  Prograf last taken at : 9pm

## 2022-05-07 DIAGNOSIS — Z940 Kidney transplant status: Principal | ICD-10-CM

## 2022-05-07 DIAGNOSIS — N022 Recurrent and persistent hematuria with diffuse membranous glomerulonephritis: Principal | ICD-10-CM

## 2022-05-07 DIAGNOSIS — Z48298 Encounter for aftercare following other organ transplant: Principal | ICD-10-CM

## 2022-05-07 DIAGNOSIS — D849 Immunodeficiency, unspecified: Principal | ICD-10-CM

## 2022-05-07 DIAGNOSIS — N042 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: Principal | ICD-10-CM

## 2022-05-08 DIAGNOSIS — Z79899 Other long term (current) drug therapy: Principal | ICD-10-CM

## 2022-05-08 DIAGNOSIS — Z1159 Encounter for screening for other viral diseases: Principal | ICD-10-CM

## 2022-05-08 DIAGNOSIS — Z940 Kidney transplant status: Principal | ICD-10-CM

## 2022-05-15 DIAGNOSIS — Z79899 Other long term (current) drug therapy: Principal | ICD-10-CM

## 2022-05-15 DIAGNOSIS — N022 Recurrent and persistent hematuria with diffuse membranous glomerulonephritis: Principal | ICD-10-CM

## 2022-05-15 DIAGNOSIS — D849 Immunodeficiency, unspecified: Principal | ICD-10-CM

## 2022-05-15 DIAGNOSIS — Z940 Kidney transplant status: Principal | ICD-10-CM

## 2022-05-15 DIAGNOSIS — I10 Essential (primary) hypertension: Principal | ICD-10-CM

## 2022-05-15 DIAGNOSIS — Z944 Liver transplant status: Principal | ICD-10-CM

## 2022-05-15 DIAGNOSIS — Z48298 Encounter for aftercare following other organ transplant: Principal | ICD-10-CM

## 2022-05-16 NOTE — Unmapped (Unsigned)
[facility] has made a second and final attempt to reach this patient to refill the following medication: myfortic.      We have left voicemails on the following phone numbers: [phone] and have sent a text message to the following phone numbers: [phone] .    Dates contacted: 05/08/22  05/16/22  Last scheduled delivery:  04/14/22    The patient may be at risk of non-compliance with this medication. The patient should call the [facility] at [phone]  Option 4, then Option 2 (all other specialty patients) to refill medication.    [place] A Glennette Galster   [facility] Specialty Technician

## 2022-05-19 MED FILL — MYCOPHENOLATE SODIUM 180 MG TABLET,DELAYED RELEASE: ORAL | 30 days supply | Qty: 120 | Fill #6

## 2022-05-20 ENCOUNTER — Encounter (HOSPITAL_BASED_OUTPATIENT_CLINIC_OR_DEPARTMENT_OTHER): Payer: Self-pay | Admitting: Emergency Medicine

## 2022-05-20 ENCOUNTER — Emergency Department (HOSPITAL_BASED_OUTPATIENT_CLINIC_OR_DEPARTMENT_OTHER)
Admission: EM | Admit: 2022-05-20 | Discharge: 2022-05-20 | Disposition: A | Discharge status: Home / Self Care | Payer: Medicare HMO | Attending: Emergency Medicine | Admitting: Emergency Medicine

## 2022-05-20 ENCOUNTER — Other Ambulatory Visit: Payer: Self-pay

## 2022-05-20 DIAGNOSIS — Z7982 Long term (current) use of aspirin: Secondary | ICD-10-CM | POA: Diagnosis not present

## 2022-05-20 DIAGNOSIS — Z944 Liver transplant status: Secondary | ICD-10-CM | POA: Insufficient documentation

## 2022-05-20 DIAGNOSIS — R197 Diarrhea, unspecified: Secondary | ICD-10-CM

## 2022-05-20 DIAGNOSIS — E860 Dehydration: Secondary | ICD-10-CM | POA: Diagnosis not present

## 2022-05-20 DIAGNOSIS — Z940 Kidney transplant status: Secondary | ICD-10-CM | POA: Diagnosis not present

## 2022-05-20 DIAGNOSIS — R109 Unspecified abdominal pain: Secondary | ICD-10-CM | POA: Diagnosis not present

## 2022-05-20 DIAGNOSIS — Z91040 Latex allergy status: Secondary | ICD-10-CM | POA: Insufficient documentation

## 2022-05-20 LAB — CBC
HCT: 46.5 % — ABNORMAL HIGH (ref 36.0–46.0)
Hemoglobin: 14.7 g/dL (ref 12.0–15.0)
MCH: 26.4 pg (ref 26.0–34.0)
MCHC: 31.6 g/dL (ref 30.0–36.0)
MCV: 83.5 fL (ref 80.0–100.0)
Platelets: 378 10*3/uL (ref 150–400)
RBC: 5.57 MIL/uL — ABNORMAL HIGH (ref 3.87–5.11)
RDW: 14 % (ref 11.5–15.5)
WBC: 10.5 10*3/uL (ref 4.0–10.5)
nRBC: 0 % (ref 0.0–0.2)

## 2022-05-20 LAB — COMPREHENSIVE METABOLIC PANEL
ALT: 13 U/L (ref 0–44)
AST: 17 U/L (ref 15–41)
Albumin: 4.3 g/dL (ref 3.5–5.0)
Alkaline Phosphatase: 56 U/L (ref 38–126)
Anion gap: 12 (ref 5–15)
BUN: 15 mg/dL (ref 8–23)
CO2: 25 mmol/L (ref 22–32)
Calcium: 10.1 mg/dL (ref 8.9–10.3)
Chloride: 103 mmol/L (ref 98–111)
Creatinine, Ser: 0.69 mg/dL (ref 0.44–1.00)
GFR, Estimated: >60 mL/min (ref >60)
Glucose, Bld: 132 mg/dL — ABNORMAL HIGH (ref 70–99)
Potassium: 4.1 mmol/L (ref 3.5–5.1)
Sodium: 140 mmol/L (ref 135–145)
Total Bilirubin: 0.9 mg/dL (ref 0.3–1.2)
Total Protein: 7.4 g/dL (ref 6.5–8.1)

## 2022-05-20 LAB — URINALYSIS, ROUTINE W REFLEX MICROSCOPIC
Glucose, UA: NEGATIVE mg/dL
Ketones, ur: NEGATIVE mg/dL
Leukocytes,Ua: NEGATIVE
Nitrite: NEGATIVE
Protein, ur: >300 mg/dL — AB
RBC / HPF: >50 RBC/hpf — ABNORMAL HIGH (ref 0–5)
Specific Gravity, Urine: >1.046 — ABNORMAL HIGH (ref 1.005–1.030)
pH: 6 (ref 5.0–8.0)

## 2022-05-20 LAB — LACTIC ACID, PLASMA: Lactic Acid, Venous: 1.3 mmol/L (ref 0.5–1.9)

## 2022-05-20 LAB — LIPASE, BLOOD: Lipase: <10 U/L — ABNORMAL LOW (ref 11–51)

## 2022-05-20 MED ORDER — HYOSCYAMINE SULFATE 0.125 MG SL SUBL
0.1250 mg | SUBLINGUAL_TABLET | Freq: Once | SUBLINGUAL | Status: AC
  Administered 2022-05-20: 0.125 mg via SUBLINGUAL
  Filled 2022-05-20: qty 1

## 2022-05-20 MED ORDER — LACTATED RINGERS IV BOLUS
1000.0000 mL | Freq: Once | INTRAVENOUS | Status: AC
  Administered 2022-05-20: 1000 mL via INTRAVENOUS

## 2022-05-20 MED ORDER — ONDANSETRON HCL 4 MG/2ML IJ SOLN
4.0000 mg | Freq: Once | INTRAMUSCULAR | Status: AC
Start: 2022-05-20 — End: 2022-05-20
  Administered 2022-05-20: 4 mg via INTRAVENOUS
  Filled 2022-05-20: qty 2

## 2022-05-20 MED ORDER — ONDANSETRON 4 MG PO TBDP: 4.0000 mg | ORAL_TABLET | ORAL | 0 refills | Status: DC | PRN

## 2022-05-20 MED ORDER — ACETAMINOPHEN 500 MG PO TABS
1000.0000 mg | ORAL_TABLET | Freq: Once | ORAL | Status: AC
  Administered 2022-05-20: 1000 mg via ORAL
  Filled 2022-05-20: qty 2

## 2022-05-20 NOTE — ED Notes (Signed)
I have just notified the Renal consult requested by Dr. Johnney Killian @ [phone].

## 2022-05-20 NOTE — ED Notes (Signed)
Pt advised urine sample needed

## 2022-05-20 NOTE — Discharge Instructions (Addendum)
1.  You to take in a lot of fluids.  Read instructions on rehydration. 2.  Get your transplant labs done on Monday and see your doctor on Thursday. 3.  Return to emergency department immediately if you are having vomiting cannot tolerate your fluids feel weak lightheaded get a fever or worsening abdominal pain.

## 2022-05-20 NOTE — ED Triage Notes (Signed)
  Patient comes in with lower abdominal pain and diarrhea that started last night around 2100.  Patient states she ate [place] food at a place she has never been before around 1300 yesterday afternoon, and is concerned for possible food poisoning.  Patient states she has had several episodes of loose, watery diarrhea that is yellow in color.  Patient endorses nausea with no vomiting.  Pain 8/10, cramping in lower abdomen.

## 2022-05-20 NOTE — ED Notes (Signed)
RT note: Pt. ambulated with minimal assistance to/from restroom, RN made aware.

## 2022-05-20 NOTE — ED Provider Notes (Signed)
[facility] EMERGENCY DEPT Provider Note   CSN: [id number] Arrival date & time: 05/20/22  0541     History  Chief Complaint  Patient presents with   Abdominal Pain   Diarrhea    Savannah Jackson is a 66 y.o. female.  HPI Patient has history of kidney and liver transplant about 5 years ago on immunosuppressive therapy.  Patient reports she was feeling well.  She ate at a [employer] for lunch with friends yesterday.  She reports around evening time she started getting cramping abdominal pain and diarrhea.  She reports she has had copious diarrhea all night long.  She reports its been yellow and watery.  She has not had a fever that she knows of.  She has not had any vomiting but does feel nauseated.  Patient reports the cramping and discomfort in the abdomen persists as well as frequent diarrhea.  Reports she now feels slightly weak and lightheaded.  No syncopal episodes.  No prior history of recurrent or frequent diarrhea.  She reports prior to this event she was having normal bowel movements    Home Medications Prior to Admission medications   Medication Sig Start Date End Date Taking? Authorizing Provider  ondansetron (ZOFRAN-ODT) 4 MG disintegrating tablet Take 1 tablet (4 mg total) by mouth every 4 (four) hours as needed for nausea or vomiting. 05/20/22  Yes Charlesetta Shanks, MD  Acetaminophen 325 MG CAPS Take 650 mg by mouth as needed (Headache).    [provider]  aspirin 81 MG chewable tablet Chew 1 tablet (81 mg total) by mouth 2 (two) times daily. 12/10/21   Mcarthur Rossetti, MD  atorvastatin (LIPITOR) 10 MG tablet Take 10 mg by mouth daily.  12/04/18   [provider]  BELBUCA 300 MCG FILM Take 300 mcg by mouth at bedtime. 10/17/21   [provider]  cholecalciferol (VITAMIN D) 1000 units tablet Take 1,000 Units by mouth daily.    [provider]  ENVARSUS XR 1 MG TB24 Take 2 mg by mouth daily before breakfast.     [provider]  methocarbamol (ROBAXIN) 500 MG tablet Take 1 tablet (500 mg total) by mouth every 6 (six) hours as needed for muscle spasms. 12/10/21   Mcarthur Rossetti, MD  mycophenolate (MYFORTIC) 180 MG EC tablet Take 360 mg by mouth in the morning.    [provider]  oxyCODONE (OXY IR/ROXICODONE) 5 MG immediate release tablet Take 1-2 tablets (5-10 mg total) by mouth every 4 (four) hours as needed for moderate pain (pain score 4-6). 12/10/21   Mcarthur Rossetti, MD  pantoprazole (PROTONIX) 40 MG tablet Take 40 mg by mouth daily.    [provider]      Allergies    Latex and Vicodin [hydrocodone-acetaminophen]    Review of Systems   Review of Systems 10 systems reviewed negative except as per HPI Physical Exam Updated Vital Signs BP 118/76   Pulse 83   Temp 98.2 F (36.8 C) (Oral)   Resp 16   Ht '5\' 4"'$  (1.626 m)   Wt 89.8 kg   SpO2 98%   BMI 33.99 kg/m  Physical Exam Constitutional:      Comments: Patient is alert and nontoxic.  No respiratory distress.  She is uncomfortable in appearance  HENT:     Mouth/Throat:     Pharynx: Oropharynx is clear.  Eyes:     Extraocular Movements: Extraocular movements intact.     Conjunctiva/sclera: Conjunctivae normal.  Cardiovascular:     Rate and Rhythm: Normal rate and regular rhythm.  Pulmonary:     Effort: Pulmonary effort is normal.     Breath sounds: Normal breath sounds.  Abdominal:     General: There is no distension.     Palpations: Abdomen is soft.     Tenderness: There is no abdominal tenderness. There is no guarding.  Musculoskeletal:        General: No swelling or tenderness. Normal range of motion.     Right lower leg: No edema.     Left lower leg: No edema.  Skin:    General: Skin is warm and dry.  Neurological:     General: No focal deficit present.     Mental Status: She is oriented to person, place, and time.     Coordination: Coordination normal.  Psychiatric:         Mood and Affect: Mood normal.     ED Results / Procedures / Treatments   Labs (all labs ordered are listed, but only abnormal results are displayed) Labs Reviewed  LIPASE, BLOOD - Abnormal; Notable for the following components:      Result Value   Lipase <10 (*)    All other components within normal limits  COMPREHENSIVE METABOLIC PANEL - Abnormal; Notable for the following components:   Glucose, Bld 132 (*)    All other components within normal limits  CBC - Abnormal; Notable for the following components:   RBC 5.57 (*)    HCT 46.5 (*)    All other components within normal limits  URINALYSIS, ROUTINE W REFLEX MICROSCOPIC - Abnormal; Notable for the following components:   APPearance CLOUDY (*)    Specific Gravity, Urine >1.046 (*)    Hgb urine dipstick MODERATE (*)    Bilirubin Urine SMALL (*)    Protein, ur >300 (*)    RBC / HPF >50 (*)    All other components within normal limits  GASTROINTESTINAL PANEL BY PCR, STOOL (REPLACES STOOL CULTURE)  LACTIC ACID, PLASMA  CMV DNA, QUANTITATIVE, PCR    EKG None  Radiology No results found.  Procedures Procedures    Medications Ordered in ED Medications  lactated ringers bolus 1,000 mL (0 mLs Intravenous Stopped 05/20/22 1000)  ondansetron (ZOFRAN) injection 4 mg (4 mg Intravenous Given 05/20/22 0802)  hyoscyamine (LEVSIN SL) SL tablet 0.125 mg (0.125 mg Sublingual Given 05/20/22 0802)  lactated ringers bolus 1,000 mL (1,000 mLs Intravenous New Bag/Given 05/20/22 1421)    ED Course/ Medical Decision Making/ A&P                           Medical Decision Making Amount and/or Complexity of Data Reviewed Labs: ordered.  Risk Prescription drug management.   Patient has significant comorbid condition of immunosuppression with history of liver and kidney transplant.  Patient presents with acute onset of copious diarrhea likely infectious or toxin related from a meal eaten yesterday.  Patient's abdominal pain is diffuse and  crampy in nature.  She does not have reproducible pain.  We will proceed with diagnostic lab work.  At this time I do not feel that CT scan imaging is indicated  For treatment we will proceed with fluid resuscitation with lactated Ringer's and control of nausea with Zofran and cramping abdominal pain with sublingual hyoscyamine.  Consult: Reviewed with Dr. Hillery Aldo transplant team.  We have reviewed the patient's labs, presentation and clinical course in the  emergency department.  At this time request is made to add CMV PCR.  Patient does not need empiric antibiotic therapy at this time.  If she is tolerating oral intake and can stay hydrated appropriate for follow-up patient will get follow-up labs on Monday and be seen in clinic on Thursday.  Patient has responded well to treatment.  She has been up and ambulatory multiple times.  She is making urine.  Frequency of diarrhea is decreasing.  At this time appears stable for discharge and outpatient management.  Strict return precautions have been reviewed.  Patient voices understanding.        Final Clinical Impression(s) / ED Diagnoses Final diagnoses:  History of kidney transplant  History of liver transplant ([facility])  Dehydration  Diarrhea of presumed infectious origin    Rx / DC Orders ED Discharge Orders          Ordered    ondansetron (ZOFRAN-ODT) 4 MG disintegrating tablet  Every 4 hours PRN        05/20/22 1426              Charlesetta Shanks, MD 05/20/22 1427

## 2022-05-22 LAB — GASTROINTESTINAL PANEL BY PCR, STOOL (REPLACES STOOL CULTURE)

## 2022-05-23 LAB — CMV DNA, QUANTITATIVE, PCR
CMV DNA Quant: NEGATIVE IU/mL
Log10 CMV Qn DNA Pl: UNDETERMINED log10 IU/mL

## 2022-05-25 ENCOUNTER — Ambulatory Visit
Admit: 2022-05-25 | Discharge: 2022-05-27 | Disposition: A | Discharge status: Home / Self Care | Payer: MEDICARE | Admitting: Nephrology

## 2022-05-25 ENCOUNTER — Ambulatory Visit: Admit: 2022-05-25 | Discharge: 2022-05-25 | Payer: MEDICARE

## 2022-05-25 ENCOUNTER — Other Ambulatory Visit: Admit: 2022-05-25 | Discharge: 2022-05-25 | Payer: MEDICARE

## 2022-05-25 ENCOUNTER — Ambulatory Visit: Admit: 2022-05-25 | Discharge: 2022-05-25 | Payer: MEDICARE | Attending: Nephrology | Primary: Nephrology

## 2022-05-25 DIAGNOSIS — I10 Essential (primary) hypertension: Principal | ICD-10-CM

## 2022-05-25 DIAGNOSIS — Z79899 Other long term (current) drug therapy: Principal | ICD-10-CM

## 2022-05-25 DIAGNOSIS — D849 Immunodeficiency, unspecified: Principal | ICD-10-CM

## 2022-05-25 DIAGNOSIS — N022 Recurrent and persistent hematuria with diffuse membranous glomerulonephritis: Principal | ICD-10-CM

## 2022-05-25 DIAGNOSIS — Z48298 Encounter for aftercare following other organ transplant: Principal | ICD-10-CM

## 2022-05-25 DIAGNOSIS — Z944 Liver transplant status: Principal | ICD-10-CM

## 2022-05-25 DIAGNOSIS — Z940 Kidney transplant status: Principal | ICD-10-CM

## 2022-05-25 LAB — URINALYSIS WITH MICROSCOPY
BILIRUBIN UA: NEGATIVE
BLOOD UA: NEGATIVE
GLUCOSE UA: NEGATIVE
KETONES UA: NEGATIVE
NITRITE UA: NEGATIVE
PH UA: 5.5 (ref 5.0–9.0)
PROTEIN UA: 30 — AB
RBC UA: <1 /HPF (ref <4)
SPECIFIC GRAVITY UA: 1.025 (ref 1.005–1.030)
SQUAMOUS EPITHELIAL: 24 /HPF — ABNORMAL HIGH (ref 0–5)
UROBILINOGEN UA: 0.2
WBC UA: 15 /HPF — ABNORMAL HIGH (ref 0–5)

## 2022-05-25 LAB — CMV DNA, QUANTITATIVE, PCR: CMV VIRAL LD: NOT DETECTED

## 2022-05-25 LAB — CBC W/ AUTO DIFF
BASOPHILS ABSOLUTE COUNT: 0 10*9/L (ref 0.0–0.1)
BASOPHILS ABSOLUTE COUNT: 0 10*9/L (ref 0.0–0.1)
BASOPHILS RELATIVE PERCENT: 0.8 %
BASOPHILS RELATIVE PERCENT: 1 %
EOSINOPHILS ABSOLUTE COUNT: 0.2 10*9/L (ref 0.0–0.5)
EOSINOPHILS ABSOLUTE COUNT: 0.2 10*9/L (ref 0.0–0.5)
EOSINOPHILS RELATIVE PERCENT: 4.6 %
EOSINOPHILS RELATIVE PERCENT: 4.8 %
HEMATOCRIT: 41.4 % (ref 34.0–44.0)
HEMATOCRIT: 43.2 % (ref 34.0–44.0)
HEMOGLOBIN: 13.7 g/dL (ref 11.3–14.9)
HEMOGLOBIN: 14.4 g/dL (ref 11.3–14.9)
LYMPHOCYTES ABSOLUTE COUNT: 1.6 10*9/L (ref 1.1–3.6)
LYMPHOCYTES ABSOLUTE COUNT: 1.6 10*9/L (ref 1.1–3.6)
LYMPHOCYTES RELATIVE PERCENT: 37.7 %
LYMPHOCYTES RELATIVE PERCENT: 41.7 %
MEAN CORPUSCULAR HEMOGLOBIN CONC: 33.1 g/dL (ref 32.0–36.0)
MEAN CORPUSCULAR HEMOGLOBIN CONC: 33.3 g/dL (ref 32.0–36.0)
MEAN CORPUSCULAR HEMOGLOBIN: 26.2 pg (ref 25.9–32.4)
MEAN CORPUSCULAR HEMOGLOBIN: 26.7 pg (ref 25.9–32.4)
MEAN CORPUSCULAR VOLUME: 79.4 fL (ref 77.6–95.7)
MEAN CORPUSCULAR VOLUME: 80.4 fL (ref 77.6–95.7)
MEAN PLATELET VOLUME: 8.6 fL (ref 6.8–10.7)
MEAN PLATELET VOLUME: 8.8 fL (ref 6.8–10.7)
MONOCYTES ABSOLUTE COUNT: 0.8 10*9/L (ref 0.3–0.8)
MONOCYTES ABSOLUTE COUNT: 0.6 10*9/L (ref 0.3–0.8)
MONOCYTES RELATIVE PERCENT: 17.9 %
MONOCYTES RELATIVE PERCENT: 16.1 %
NEUTROPHILS ABSOLUTE COUNT: 1.7 10*9/L — ABNORMAL LOW (ref 1.8–7.8)
NEUTROPHILS ABSOLUTE COUNT: 1.4 10*9/L — ABNORMAL LOW (ref 1.8–7.8)
NEUTROPHILS RELATIVE PERCENT: 39 %
NEUTROPHILS RELATIVE PERCENT: 36.4 %
NUCLEATED RED BLOOD CELLS: 0 /100{WBCs} (ref <=4)
PLATELET COUNT: 319 10*9/L (ref 150–450)
PLATELET COUNT: 290 10*9/L (ref 150–450)
RED BLOOD CELL COUNT: 5.22 10*12/L — ABNORMAL HIGH (ref 3.95–5.13)
RED BLOOD CELL COUNT: 5.37 10*12/L — ABNORMAL HIGH (ref 3.95–5.13)
RED CELL DISTRIBUTION WIDTH: 15.7 % — ABNORMAL HIGH (ref 12.2–15.2)
RED CELL DISTRIBUTION WIDTH: 15.4 % — ABNORMAL HIGH (ref 12.2–15.2)
WBC ADJUSTED: 4.3 10*9/L (ref 3.6–11.2)
WBC ADJUSTED: 3.8 10*9/L (ref 3.6–11.2)

## 2022-05-25 LAB — COMPREHENSIVE METABOLIC PANEL
ALBUMIN: 3.4 g/dL (ref 3.4–5.0)
ALKALINE PHOSPHATASE: 61 U/L (ref 46–116)
ALT (SGPT): 27 U/L (ref 10–49)
ANION GAP: 12 mmol/L (ref 5–14)
AST (SGOT): 19 U/L (ref <=34)
BILIRUBIN TOTAL: 0.2 mg/dL — ABNORMAL LOW (ref 0.3–1.2)
BLOOD UREA NITROGEN: 44 mg/dL — ABNORMAL HIGH (ref 9–23)
BUN / CREAT RATIO: 16
CALCIUM: 8.5 mg/dL — ABNORMAL LOW (ref 8.7–10.4)
CHLORIDE: 108 mmol/L — ABNORMAL HIGH (ref 98–107)
CO2: 17 mmol/L — ABNORMAL LOW (ref 20.0–31.0)
CREATININE: 2.74 mg/dL — ABNORMAL HIGH
EGFR CKD-EPI (2021) FEMALE: 19 mL/min/{1.73_m2} — ABNORMAL LOW (ref >=60)
GLUCOSE RANDOM: 95 mg/dL (ref 70–99)
POTASSIUM: 3.5 mmol/L (ref 3.4–4.8)
PROTEIN TOTAL: 6.3 g/dL (ref 5.7–8.2)
SODIUM: 137 mmol/L (ref 135–145)

## 2022-05-25 LAB — SLIDE REVIEW

## 2022-05-25 LAB — TACROLIMUS LEVEL: TACROLIMUS BLOOD: 4.6 ng/mL

## 2022-05-25 LAB — PROTEIN / CREATININE RATIO, URINE
CREATININE, URINE: 248.4 mg/dL
PROTEIN URINE: 55 mg/dL
PROTEIN/CREAT RATIO, URINE: 0.221

## 2022-05-25 LAB — BASIC METABOLIC PANEL
ANION GAP: 11 mmol/L (ref 5–14)
BLOOD UREA NITROGEN: 36 mg/dL — ABNORMAL HIGH (ref 9–23)
BUN / CREAT RATIO: 15
CALCIUM: 8.3 mg/dL — ABNORMAL LOW (ref 8.7–10.4)
CHLORIDE: 106 mmol/L (ref 98–107)
CO2: 16 mmol/L — ABNORMAL LOW (ref 20.0–31.0)
CREATININE: 2.45 mg/dL — ABNORMAL HIGH
EGFR CKD-EPI (2021) FEMALE: 21 mL/min/{1.73_m2} — ABNORMAL LOW (ref >=60)
GLUCOSE RANDOM: 80 mg/dL (ref 70–179)
SODIUM: 133 mmol/L — ABNORMAL LOW (ref 135–145)

## 2022-05-25 LAB — MAGNESIUM: MAGNESIUM: 1.4 mg/dL — ABNORMAL LOW (ref 1.6–2.6)

## 2022-05-25 LAB — PHOSPHORUS: PHOSPHORUS: 5.5 mg/dL — ABNORMAL HIGH (ref 2.4–5.1)

## 2022-05-25 MED ADMIN — lactated ringers bolus 2,000 mL: 2000 mL | INTRAVENOUS | @ 18:00:00 | Stop: 2022-05-25

## 2022-05-25 MED ADMIN — ondansetron (ZOFRAN) injection 4 mg: 4 mg | INTRAVENOUS | @ 18:00:00 | Stop: 2022-05-25

## 2022-05-25 MED ADMIN — acetaminophen (TYLENOL) tablet 650 mg: 650 mg | ORAL | @ 18:00:00 | Stop: 2022-05-25

## 2022-05-25 MED ADMIN — predniSONE (DELTASONE) tablet 5 mg: 5 mg | ORAL | @ 23:00:00

## 2022-05-25 MED ADMIN — heparin (porcine) 5,000 unit/mL injection 5,000 Units: 5000 [IU] | SUBCUTANEOUS | @ 23:00:00

## 2022-05-25 NOTE — Unmapped (Signed)
Nephrology ([facility]) History & Physical    Assessment & Plan:   Michelle Travis is a 66 y.o. female whose presentation is complicated by PMHx of liver-kidney biopsy 2018 that presented to [facility] with diarrheal illness and AKI.     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 *    Active Problems    Diarrhea - Immunocompromised   Non-bloody watery diarrhea, abdominal cramps, and nausea started on 7/22 after eating out the evening of 7/21. She is averaging 8-10 BM per day, that are watery in nature. Possible positive Rotavirus test from outside hospital. Concern for viral etiology from restaurant.   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 Decrease PO intake in setting of diarrheal illness. Baseline creatinine <1. Creatinine on admission 2.45. Likely pre-renal etiology in setting of diarrheal illness and decreased PO intake. Urine analysis concerning for possible UTI.   - maintenance IVF  - Holding myfortic in setting of infection   - Continue tacrolimus 2mg  daily   - Start 5 mg prednisone  - Fu tacrolimus trough daily; may need to adjust dosage in setting of diarrhea  - CTM renal function panel daily  - Fu Ucx    Chronic Problems    Osteopenia  - Continue home cholecalciferol     Chronic Pain   - Continue buprenorphine     Hypercholesterolemia  - Continue home atorvastatin      The patient's presentation is complicated by the following clinically significant conditions requiring additional evaluation and treatment: - Chronic kidney disease POA requiring further investigation, treatment, or monitoring   - Dehydration POA requiring further investigation, treatment, or monitoring       Issues Impacting Complexity of Management:  -The patient is at high risk of complications from acute kidney injury and diarrhea.       Medical Decision Making: Reviewed records from the following unique sources Epic (nephrology clinic today).      Checklist:  Diet: Regular Diet  DVT PPx: Heparin 5000units q8h  Code Status: Full Code  Dispo: Patient appropriate for inpatient status.    Team Contact Information:   Primary Team: Nephrology ([facility])  Primary Resident: Marguerita Beards, MD  Resident's Pager: [phone] (Nephrology Intern - Alvester Morin)    Chief Concern:   Diarrhea    Subjective:   Michelle Travis is a 66 y.o. female with pertinent PMHx of liver and kidney biopsy (2018) presenting with persistent diarrhea since 7/21. The patient went out with friends for a meal 7/21, and started having diarrhea the next day. Another friend experienced similar symptoms. The patient went to an OSH on 7/22 and was given 2L of fluid and returned home. Over the next few days, the patient continued to have diarrhea, averaging 8-10 episodes a day. Today, she had two episodes of diarrhea and took 2 tablets of loperamide. The patient denies blood in her stool. She is also  experiencing nausea, but has not vomited. Due to the nausea, she has had decreased PO intake, however she has been able to drink gatorade. She denies abdominal pain.   Over the past week, she has also noticed decrease urine output, and that her urine is darker. She is experiencing some discomfort with urination, but denies any suprapubic tenderness. She notes one episode of a subjective fever. She endorses overall fatigue and weakness, but denies, chest pain, light headedness, and other infectious symptoms.    History obtained by the patient and her daughter.     Pertinent Surgical Hx  - Liver/Kidney transplant  - R knee replacement   - Hip replacement    Pertinent Social Hx   Lives with her grand children and one great grandchild. Lives 1:30 hours away.     Allergies  Latex, Hydrocodone, and Hydrocodone-acetaminophen    I reviewed the Medication List. The current list is Accurate    Designated Healthcare Decision Maker:  Ms. Neilsen currently has decisional capacity for healthcare decision-making and is able to designate a surrogate healthcare decision maker. Ms. Barrientez designated healthcare decision maker(s) is/are Alicia(the patient's adult child) as denoted by stated patient preference.    Objective:   Physical Exam:  Temp:  [36.4 ??C (97.5 ??F)-36.6 ??C (97.9 ??F)] 36.6 ??C (97.9 ??F)  Heart Rate:  [48-52] 48  Resp:  [16] 16  BP: (92-94)/(66-69) 92/69  SpO2:  [98 %] 98 %    Gen: NAD, converses   Eyes: Sclera anicteric, EOMI grossly normal   HENT: Atraumatic, normocephalic  Neck: Trachea midline  Heart: RRR  Lungs: CTAB, no crackles or wheezes  Abdomen: Soft, NTND  Extremities: No edema  Neuro: Grossly symmetric, non-focal    Skin:  No rashes, lesions on clothed exam  Psych: Alert, oriented

## 2022-05-25 NOTE — Unmapped (Signed)
[facility]  Emergency Department Provider Note     ED Clinical Impression     Final diagnoses:   AKI (acute kidney injury) (CMS-HCC) (Primary)   Dehydration        Impression, Medical Decision Making, ED Course     Impression: 66 y.o. 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 who presented to [facility] transplant nephrology clinic this morning and was found to have an AKI in the setting of a recent rotavirus infection causing NVD.  Creatinine up to 2.74 from 0.6.  Was advised by her nephrologist to go to the ED to be admitted for inpatient management.     When seen in the ED, patient experiences notable lightheadedness upon standing and unsteadiness with ambulation.  States that her NVD has been gradually improving, but still with some diarrhea this morning.  Has been taking Zofran since 7/22 which patient feels has helped.     Diagnostic workup to include basic labs, UA, and renal US. Will treat patient with a 2 L bolus of normal saline while she awaits admission with nephrology. MAO paged to proceed with admission process.     Orders Placed This Encounter   Procedures    Aplus IV Pump    Blood Culture    Blood Culture    GI Pathogen Panel    C. Difficile Assay    Basic Metabolic Panel    CBC w/ Differential Tacrolimus Level, Trough    Urinalysis with Microscopy with Culture Reflex    Renal Function Panel    Magnesium Level    CBC    Potassium Level    Lipase Level    Lactate, Venous, Whole Blood    IgG    IgA    Nutrition Therapy Regular/House    Vital signs    Notify Provider    Patient may shower    Weigh patient    Incentive Spirometry    Flush line per protocol    Strict intake and output    Full Code    Enteric Isolation Status    ECG 12 Lead    Insert peripheral IV    Saline lock IV    Place Patient in Bed    ED Admit Decision            Independent Interpretation of Studies: I have independently interpreted the following studies:  ECG: Notable for sinus bradycardia and left ventricular hypertrophy; generally unchanged from prior ECG.      Considerations Regarding Disposition/Escalation of Care and Critical Care:  Patient will need to be admitted for AKI management and evaluation by transplant nephrology.   ____________________________________________    The case was discussed with the attending physician, who is in agreement with the above assessment and plan.      History     Chief Complaint  Chief Complaint   Patient presents with    Dizziness       HPI   Michelle Travis is a 66 y.o. female with past medical history as below  who presents with lightheadedness upon standing in the setting of a recent rotavirus infection causing NVD.      Per chart review, she has lost 6 pounds in the last week. She has been sick starting on 7/21 after she was at a restaurant with a group of friends.  She was in the ER on 7/22 with NVD. During that admission, she received 2 liters of fluid and her stool sample tested positive for rotavirus. She was discharged with Zofran which patient feels has helped reduce her NVD.    Earlier today she was seen at the [facility] Transplant clinic where she was found to have an elevated creatinine of 2.74 up from 0.6 and was advised to go to the ED for eventual admission to nephrology for inpatient management of her suspected AKI.     In the ED, patient reports no longer feeling nauseous and has not had any additional emesis episodes. Reports a small amount of diarrhea this AM, but otherwise feels better from the GI standpoint. She still feels notably lightheaded upon standing and unsteady when ambulating.     Outside Historian(s): I have obtained additional history/collateral from patient's daughter who was present with her in the ED.    External Records Reviewed: I have reviewed recent electronic medical records from patient's transplant nephrology clinic at [facility] and most recent ED visit at [facility].    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 cirrhosis (CMS-HCC)     Atelectasis 08/26/2016    Brain aneurysm     Breast cancer (CMS-HCC)     Cirrhosis, alcoholic (CMS-HCC)     Degenerative arthritis     Esophageal varices (CMS-HCC)     H/o ETOH abuse 06/19/2016    Hepatic encephalopathy (CMS-HCC)     History of cholecystectomy 08/25/2016    Hypertension     Leukocytosis 10/31/2016    Lower extremity edema     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    Pleural effusion associated with hepatic disorder 08/26/2016    Portal hypertension (CMS-HCC)     SOB (shortness of breath) 06/19/2016    Stage III chronic kidney disease (CMS-HCC) 07/03/2015       Past Surgical History:   Procedure Laterality Date    BREAST LUMPECTOMY  ~2000    CATARACT EXTRACTION      CEREBRAL ANEURYSM REPAIR      CHOLECYSTECTOMY  2010    PR TRANSPLANT LIVER,ALLOTRANSPLANT N/A 09/13/2017    Procedure: LIVER ALLOTRANSPLANTATION; ORTHOTOPIC, PARTIAL OR WHOLE, FROM CADAVER OR LIVING DONOR, ANY AGE;  Surgeon: Florene Glen, MD;  Location: MAIN OR [place];  Service: Transplant    PR TRANSPLANTATION OF KIDNEY N/A 09/13/2017    Procedure: RENAL ALLOTRANSPLANTATION, IMPLANTATION OF GRAFT; WITHOUT RECIPIENT NEPHRECTOMY;  Surgeon: Florene Glen, MD;  Location: MAIN OR [place];  Service: Transplant    right knee replacement      TIPS PROCEDURE  07/16/2015         Current Facility-Administered Medications:     acetaminophen (TYLENOL) tablet 650 mg, 650 mg, Oral, Q6H PRN, Mack Guise, MD    [START ON 05/26/2022] atorvastatin (LIPITOR) tablet 10 mg, 10 mg, Oral, Daily, Mack Guise, MD    [START ON 05/26/2022] buprenorphine (BELBUCA) buccal film 300 mcg, 300 mcg, Buccal, Daily, Mack Guise, MD    [START ON  05/26/2022] cholecalciferol (vitamin D3 25 mcg (1,000 units)) tablet 25 mcg, 25 mcg, Oral, Daily, Mack Guise, MD    heparin (porcine) 5,000 unit/mL injection 5,000 Units, 5,000 Units, Subcutaneous, Q8H SCH, Mack Guise, MD, 5,000 Units at 05/25/22 1850    lactated Ringers infusion, 75 mL/hr, Intravenous, Continuous, Mack Guise, MD, Last Rate: 75 mL/hr at 05/25/22 2130, 75 mL/hr at 05/25/22 2130    ondansetron (ZOFRAN-ODT) disintegrating tablet 4 mg, 4 mg, Oral, Q8H PRN **OR** ondansetron (ZOFRAN) injection 4 mg, 4 mg, Intravenous, Q8H PRN, Mack Guise, MD    [START ON 05/26/2022] pantoprazole (PROTONIX) EC tablet 40 mg, 40 mg, Oral, Daily, Mack Guise, MD    predniSONE (DELTASONE) tablet 5 mg, 5 mg, Oral, Daily, Marguerita Beards, MD, 5 mg at 05/25/22 1850    [START ON 05/26/2022] tacrolimus (ENVARSUS XR) extended release tablet 2 mg, 2 mg, Oral, Daily, Mack Guise, MD    Allergies  Latex, Hydrocodone, and Hydrocodone-acetaminophen    Family History  Family History   Problem Relation Age of Onset    Aneurysm Mother     Alcohol abuse Father     Cirrhosis Father     Alcohol abuse Sister        Social History  Social History     Tobacco Use    Smoking status: Never     Passive exposure: Never    Smokeless tobacco: Never   Vaping Use    Vaping Use: Never used   Substance Use Topics    Alcohol use: No     Alcohol/week: 0.0 standard drinks    Drug use: No        Physical Exam     VITAL SIGNS: Vitals:    05/25/22 1250 05/25/22 1728 05/25/22 1809 05/25/22 2004   BP: 92/69 124/74  118/69   Pulse: (!) 48 (!) 47  52   Resp: 16 16  16    Temp: 36.6 ??C (97.9 ??F)  35.8 ??C (96.5 ??F) 36.5 ??C (97.7 ??F)   TempSrc:   Skin Oral   SpO2: 98% 100%  98%   Weight:  78.5 kg (173 lb 1.6 oz)     Height:  160 cm (5' 3)         Constitutional: Alert and oriented. No acute distress.  Eyes: Conjunctivae are normal.  HEENT: Normocephalic and atraumatic. Conjunctivae clear. No congestion. Moist mucous membranes.   Cardiovascular: Rate as above, regular rhythm. Normal and symmetric distal pulses. Brisk capillary refill. Normal skin turgor.  Respiratory: Normal respiratory effort. Breath sounds are normal. There are no wheezing or crackles heard.  Gastrointestinal: Soft, non-distended, non-tender.  Genitourinary: Deferred.  Musculoskeletal: Non-tender with normal range of motion in all extremities.  Neurologic: Normal speech and language. No gross focal neurologic deficits are appreciated. Patient is moving all extremities equally, face is symmetric at rest and with speech. Endorses lightlessness with standing and has a slow and unsteady gait while ambulating with a walking cane.   Skin: Skin is warm, dry and intact. No rash noted.  Psychiatric: Mood and affect are normal. Speech and behavior are normal.     Radiology     No orders to display       Pertinent labs & imaging results that were available during my care of the patient were independently interpreted by me and considered in my medical decision making (see chart for details).    Portions of this record have been created using Scientist, clinical (histocompatibility and immunogenetics). Dictation errors have been sought, but  may not have been identified and corrected.         Merton Border, MD  Resident  05/26/22 0001

## 2022-05-25 NOTE — Unmapped (Signed)
Transplant Nephrology Clinic Visit    Assessment and Plan  Michelle Travis is a 65 y.o. 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 She has recurrent PLA2R positive membranous nephropathy and is seen for follow up of her transplant and associated medical problems. Issues addressed today include:    # Non Oliguric acute kidney injury   #Status post kidney transplant (2018) with membranous nephropathy recurrence.  - 03/11/2021 Kidney biopsy: PLA2R positive membranous nephropathy  Treatment history   03/23/2021 Rituximab 1gm and 125mg  solumedrol   04/07/2021 Rituximab 1gm and 125mg  solumedrol   02/07/2022 Rituximab 1gm for recurrence of nephrotic range proteinuria   03/07/2022 Rituximab 1gm   - UPC after biopsy peaked at 5.02 on 03/23/21 then improved to 0.623 on 07/20/21. But has been up since uptrending. 01/27/2022 6.9 gm -->treated with rituximab 2 doses on 4/11 and 5/09 --> UPC 6.7 gm on 5/26.   - Serum creatinine ~0.5 - 0.7 and had been stable until today. She has been having  a GI illness and diarrhea . Cr at [facility] on 05/22/2022 was 0.5, but 2. 74 today.   Plan:   - Pt likely has AKI from intravascular volume depletion vs ATN vs renal vein thrombosis given heavy proteinuria. She is hypotensive in clinic although asymptomatic. She has not been able to eat much as she feels nauseous and has lost about 6 lbs in this past week. Pt amenable to go to the hospital for IVF and AKI evaluation. EMS called and on call transplant team informed  - UPCR ordered   - Tacrolimus 2mg  daily and mycophenolate 360mg  BID     #Non anion gap metabolic acidosis   - Likely in the setting of diarrheal illness     #Viral gastroenteritis 2/2 Rotavirus A  - Symptomatic management   - HOLD mycophenolate in the setting of diarrheal illness, but can determine final immunosuppression regimen at discharge from hospital   - Admit to inpatient for IVF in the setting of AKI and being unable to tolerate PO intake     #S/P Liver kidney transplant (2018) related to ETOH cirrhosis    - LFTs normal today  - She will continue to follow with the liver transplant team.     Immunosuppression.   - Tacrolimus level is 4.7 (target 3-6).   - Patient will continue Envarsus 2 mg daily and hold Myfortic 360 mg BID.       Problems not addressed at this visit. Will address at next visit post hospitalization     S/P right total hip replacement  - She will follow up with ortho clinic.    Bilateral knee pain.   - I have suggested planned possible TKR be delayed until the response to membranous recurrence treatment can be fully evaluated. This is predominantly related to concern about post-op DVT.  Will follow up with ortho clinic.     History of IgG lambda monoclonal gammopathy.   - SPEP with immunofixation 03/19/20 revealed no monoclonal protein. Kidney biopsy revealed no evidence of MGRS.    History of sleep apnea.   - She is no longer on CPAP.  - She denies daytime drowsiness.     Immunizations.  - Prevnar-20 07/20/21.  - Flu vaccine 07/20/21   - Shingrix x 2 completed  - COVID-19  bivalent booster doses completed 09/07/21 and 03/24/22. She is aware of the limited efficacy of the vaccine.     Cancer screening.   - Mammography 05/25/21 negative,  has remote history of breast cancer.  - PAP smear last year was unremarkable.  - Colonoscopy 08/07/18 with single 6 mm polyp and diverticulosis.     Follow-up.     Patient will return to clinic after discharged from hospital   Monthly labs including UPC.    History of Present Illness  Michelle Travis is a 66 y.o. 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  She was found to have post-transplant proteinuria (UPC 2.31 and 5.02) and a kidney biopsy 03/11/21 revealed recurrent membranous nephropathy. She received 2 doses of Rituximab 1 gm on 03/23/21 and 04/07/21 with subsequent improvement in proteinuria to 0.623 on 07/20/21. UPC increased to 6.94 on 01/27/22 and she was treated with 1 gm rituximab again on 02/07/22 and 03/07/22.     Since she last saw [place] in 02/2022 and was doing well until last Friday when she started having abdominal cramps, nausea and diarrhea. She has been having multiple counts of non bloody watery diarrhea. She has lost about 6 lbs in less than one weeks. She went to [facility] on 7/24 where she was diagnosed with Rotavirus. She has been trying to eat and drink, but she has not been tolerate PO intake without getting nauseous.She has been taking her medications as prescribed. She has been taking anti diarrheal but continues to have diarrhea. She does not have any fevers, chills. She feels weak, but no light headedness or dizziness.     Transplant History:  1. 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 S/P kidney and liver transplants on 09/13/17 at [facility]. Kidney KDPI 26%. CMV D+/R+; EBV D-/R+, hep C Ab +/NAT- (recipient hep C Ab negative)  4. Complicated by rectus sheath hematoma  5. Delayed graft function requiring hemodialysis sessions following transplant (stopped dialysis 11/26/018).  6. Immunosuppression was ??? induction followed by tacrolimus and myfortic maintenance therapy.   7. Baseline creatinine <1.0 mg/dL.  8. Kidney biopsy 03/11/21 with recurrent PLA2R positive membranous nephropathy, treated with Rituximab 1 gm x 4 (03/23/21, 04/07/21, 02/07/22, 03/07/22)     Past Medical History:  1. Alcoholic cirrhosis, S/P TIPS  2. Membranous nephropathy, PLA2R positive biopsy 08/30/2016, treated solumedrol initially with little improvement then with Rituximab (10/2016 and 03/2017).   3. S/P kidney and liver transplants as stated above  4. History of breast cancer, in remission for 17 years, s/p lumpectomy.  5. History of IGG monoclonal gammopathy, IgG lambda. Kidney biopsy 08/30/2016 without MGRS.  6. S/p cerebral aneurysm repair.  7. S/P total knee replacement, right   8. Essential HTN prior to ESLD  9. History of diverticulitis  10. Recurrent pleural effusion, history of pneumonia x 3  11. Osteopenia by DEXA 06/14/2016  12. Hypoattenuating thyroid nodule noted on chest CT 11/11/2016  13. Sleep apnea    Review of Systems    All other systems are reviewed and are negative except as noted in HPI    Medications    Current Outpatient Medications   Medication Sig Dispense Refill    acetaminophen (TYLENOL) 500 MG tablet Take 1 tablet (500 mg total) by mouth every six (6) hours as needed for pain.      aspirin 81 MG chewable tablet Chew 1 tablet (81 mg total) daily.      atorvastatin (LIPITOR) 10 MG tablet Take 1 tablet (10 mg total) by  mouth daily. 30 tablet 8    BELBUCA 300 mcg buccal film Apply 1 Film (300 mcg total) to cheek in the morning.      cholecalciferol, vitamin D3, 1,000 unit (25 mcg) tablet Take 1 tablet (25 mcg total) by mouth in the morning.      ENVARSUS XR 1 mg Tb24 extended release tablet Take 2 tablets (2 mg total) by mouth in the morning. MAP Renewal. 60 tablet 11    mycophenolate (MYFORTIC) 180 MG EC tablet Take 2 tablets (360 mg total) by mouth two (2) times a day. 120 tablet 11    oxyCODONE (ROXICODONE) 10 mg immediate release tablet TAKE 1 (ONE) TABLET BY MOUTH FOUR TIMES DAILY, AS NEEDED      pantoprazole (PROTONIX) 40 MG tablet Take 1 tablet (40 mg total) by mouth daily. 30 tablet 0     No current facility-administered medications for this visit.       Physical Exam    Ht 162.6 cm (5' 4)  - Wt 74.4 kg (164 lb)  - LMP  (LMP Unknown)  - BMI 28.15 kg/m??   General: Patient is a pleasant female in no apparent distress.  Eyes: Sclera anicteric.  Neck: Supple without LAD/JVD/bruits.  Lungs: Clear to auscultation bilaterally, no wheezes/rales/rhonchi.  Cardiovascular: Regular rate and rhythm without murmurs, rubs or gallops.  Abdomen: Soft, notender/nondistended. Positive bowel sounds. No hepatosplenomegaly, masses or bruits appreciated.   Extremities: trace bilateral edema  Skin: Without rash  Neurological: Grossly nonfocal.  Psychiatric: Mood and affect appropriate.    Laboratory Results and Imaging Reviewed

## 2022-05-25 NOTE — Unmapped (Signed)
Tacrolimus Therapeutic Monitoring Pharmacy Note    Michelle Travis is a 66 y.o. female continuing tacrolimus.     Indication:  Kidney and Liver Transplant      Date of Transplant: 08/2017    Prior Dosing Information: Home regimen Envarsus 2 mg daily       Goals:  Therapeutic Drug Levels  Tacrolimus trough goal: 3-5 ng/mL  Envar  Additional Clinical Monitoring/Outcomes  Monitor renal function (SCr and urine output) and liver function (LFTs)  Monitor for signs/symptoms of adverse events (e.g., hyperglycemia, hyperkalemia, hypomagnesemia, hypertension, headache, tremor)    Results:   Tacrolimus level:  4.6 ng/mL, drawn appropriately    Pharmacokinetic Considerations and Significant Drug Interactions:  Concurrent hepatotoxic medications: None identified  Concurrent CYP3A4 substrates/inhibitors: None identified  Concurrent nephrotoxic medications: None identified    Assessment/Plan:  Recommendedation(s)  Continue current regimen of Envarsus 2 mg daily    Follow-up  Next level should be ordered on 05/29/22 at 0600 .   A pharmacist will continue to monitor and recommend levels as appropriate    Please page service pharmacist with questions/clarifications.    Margarite Gouge, PharmD

## 2022-05-25 NOTE — Unmapped (Addendum)
Outpatient Follow-Up Considerations:  [ ]  Mycophenolate on hold until 8/5 with resumption at half dose 180 mg BID until follow-up with Dr. Margaretmary Bayley      [ ]  Discuss her prednisone with transplant nephrology        Hospital Course:  Michelle Travis is a 66 y.o. female with a pmhx of liver-kidney transplant 2019 c/b recurrent membranous nephropathy, admitted for diarrheal illness and AKI.    Diarrhea - Immunocompromised state   Patient ate out 7/21 and began experiencing diarrhea on 7/22. She was averaging 8-10 watery bowel movements per day. Presented to OSH on 7/22 and supposedly tested positive for rotavirus. Patient has been experiencing nausea but not vomiting. Fluid resuscitation throughout admission. GI panel shows ***.     AKI - Liver-Kidney Transplant   Baseline creatinine <1. On admission, creatinine 2.45. Likely pre-renal etiology in setting of decreased PO intake due to nausea. UA concerning for possible UTI. Patient has been experiencing decreased urine output as well as darkening urine over the last week. She also endorses some discomfort with urination. Fluid resuscitation throughout admission with improving creatinine. Creatinine on discharge was ***.

## 2022-05-25 NOTE — Unmapped (Signed)
Sent from [place] w/ some dizziness, possibly positive for norovirus.

## 2022-05-25 NOTE — Unmapped (Signed)
I spent 15 minutes with patient at her clinic visit. I reviewed and updated her medications. She took her envarsus at 9am yesterday. Her BP is low today at 90/60's. She has lost 6 pounds in the last week. She has been sick starting on Friday after she was at a restaurant with a group of friends. One of her friends got sick as well. She was in the ER on 7/22 with NVD. She got 2 liters of fluid per the patient and her stool sample tested positive for rotavirus. She is still having NVD and her creatinine today is 2.74 up from 0.6. She denies any swelling, fever or cough. She feels dizzy and lightheaded and not stable to go by private vehicle to go to the ER and ambulance was called for transport.

## 2022-05-26 LAB — RENAL FUNCTION PANEL
ALBUMIN: 2.7 g/dL — ABNORMAL LOW (ref 3.4–5.0)
ANION GAP: 10 mmol/L (ref 5–14)
BLOOD UREA NITROGEN: 42 mg/dL — ABNORMAL HIGH (ref 9–23)
BUN / CREAT RATIO: 36
CALCIUM: 8.1 mg/dL — ABNORMAL LOW (ref 8.7–10.4)
CHLORIDE: 113 mmol/L — ABNORMAL HIGH (ref 98–107)
CO2: 20 mmol/L (ref 20.0–31.0)
CREATININE: 1.16 mg/dL — ABNORMAL HIGH
EGFR CKD-EPI (2021) FEMALE: 52 mL/min/{1.73_m2} — ABNORMAL LOW (ref >=60)
GLUCOSE RANDOM: 123 mg/dL (ref 70–179)
PHOSPHORUS: 3.6 mg/dL (ref 2.4–5.1)
POTASSIUM: 4.1 mmol/L (ref 3.4–4.8)
SODIUM: 143 mmol/L (ref 135–145)

## 2022-05-26 LAB — CBC
HEMATOCRIT: 36.1 % (ref 34.0–44.0)
HEMOGLOBIN: 12.2 g/dL (ref 11.3–14.9)
MEAN CORPUSCULAR HEMOGLOBIN CONC: 33.9 g/dL (ref 32.0–36.0)
MEAN CORPUSCULAR HEMOGLOBIN: 26.9 pg (ref 25.9–32.4)
MEAN CORPUSCULAR VOLUME: 79.5 fL (ref 77.6–95.7)
MEAN PLATELET VOLUME: 9.2 fL (ref 6.8–10.7)
PLATELET COUNT: 263 10*9/L (ref 150–450)
RED BLOOD CELL COUNT: 4.54 10*12/L (ref 3.95–5.13)
RED CELL DISTRIBUTION WIDTH: 15.2 % (ref 12.2–15.2)
WBC ADJUSTED: 2.8 10*9/L — ABNORMAL LOW (ref 3.6–11.2)

## 2022-05-26 LAB — URINALYSIS WITH MICROSCOPY WITH CULTURE REFLEX
BILIRUBIN UA: NEGATIVE
BLOOD UA: NEGATIVE
GLUCOSE UA: NEGATIVE
KETONES UA: NEGATIVE
NITRITE UA: NEGATIVE
PH UA: 5.5 (ref 5.0–9.0)
PROTEIN UA: NEGATIVE
RBC UA: 1 /HPF (ref <=4)
SPECIFIC GRAVITY UA: 1.01 (ref 1.003–1.030)
SQUAMOUS EPITHELIAL: 2 /HPF (ref 0–5)
TRANSITIONAL EPITHELIAL: <1 /HPF (ref 0–2)
UROBILINOGEN UA: <2
WBC UA: 3 /HPF (ref 0–5)

## 2022-05-26 LAB — BK VIRUS QUANTITATIVE PCR, BLOOD: BK BLOOD RESULT: NOT DETECTED

## 2022-05-26 LAB — MAGNESIUM: MAGNESIUM: 1.4 mg/dL — ABNORMAL LOW (ref 1.6–2.6)

## 2022-05-26 LAB — TACROLIMUS LEVEL, TROUGH: TACROLIMUS, TROUGH: 8.4 ng/mL (ref 5.0–15.0)

## 2022-05-26 LAB — IGA: GAMMAGLOBULIN; IGA: 91.5 mg/dL (ref 70.0–400.0)

## 2022-05-26 LAB — IGG: GAMMAGLOBULIN; IGG: 636 mg/dL — ABNORMAL LOW (ref 646–2013)

## 2022-05-26 LAB — LIPASE: LIPASE: 27 U/L (ref 12–53)

## 2022-05-26 MED ADMIN — atorvastatin (LIPITOR) tablet 10 mg: 10 mg | ORAL | @ 13:00:00

## 2022-05-26 MED ADMIN — tacrolimus (ENVARSUS XR) extended release tablet 2 mg: 2 mg | ORAL | @ 13:00:00

## 2022-05-26 MED ADMIN — pantoprazole (PROTONIX) EC tablet 40 mg: 40 mg | ORAL | @ 13:00:00

## 2022-05-26 MED ADMIN — lactated Ringers infusion: 75 mL/h | INTRAVENOUS | @ 02:00:00 | Stop: 2022-05-26

## 2022-05-26 MED ADMIN — buprenorphine (BELBUCA) buccal film 300 mcg: 300 ug | BUCCAL | @ 13:00:00 | Stop: 2022-05-26

## 2022-05-26 MED ADMIN — lactated Ringers infusion: 75 mL/h | INTRAVENOUS | @ 17:00:00 | Stop: 2022-05-27

## 2022-05-26 MED ADMIN — cholecalciferol (vitamin D3 25 mcg (1,000 units)) tablet 25 mcg: 25 ug | ORAL | @ 13:00:00

## 2022-05-26 MED ADMIN — predniSONE (DELTASONE) tablet 5 mg: 5 mg | ORAL | @ 13:00:00

## 2022-05-26 MED ADMIN — acetaminophen (TYLENOL) tablet 650 mg: 650 mg | ORAL | @ 09:00:00

## 2022-05-26 MED ADMIN — heparin (porcine) 5,000 unit/mL injection 5,000 Units: 5000 [IU] | SUBCUTANEOUS | @ 11:00:00

## 2022-05-26 MED ADMIN — magnesium sulfate 2gm/50mL IVPB: 2 g | INTRAVENOUS | @ 13:00:00 | Stop: 2022-05-26

## 2022-05-26 NOTE — Unmapped (Signed)
Pt denied pain or discomfort earlier in the shift but reported some ABD cramping after having a BM. She was medicated for the pain with her PRN tylenol with reported relief. She remains on IV fluids. Ambulating to the bathroom independently using her cane. She has reported one loose BM this shift. Remains alert and oriented and able to make her needs known.     Problem: Adult Inpatient Plan of Care  Goal: Plan of Care Review  Outcome: Progressing  Goal: Patient-Specific Goal (Individualized)  Outcome: Progressing  Goal: Absence of Hospital-Acquired Illness or Injury  Outcome: Progressing  Intervention: Identify and Manage Fall Risk  Recent Flowsheet Documentation  Taken 05/26/2022 0600 by Modena Morrow, RN  Safety Interventions:   low bed   fall reduction program maintained  Taken 05/26/2022 0200 by Modena Morrow, RN  Safety Interventions:   low bed   fall reduction program maintained  Taken 05/26/2022 0000 by Modena Morrow, RN  Safety Interventions:   low bed   fall reduction program maintained  Taken 05/25/2022 2200 by Modena Morrow, RN  Safety Interventions:   low bed   fall reduction program maintained  Taken 05/25/2022 2000 by Modena Morrow, RN  Safety Interventions:   low bed   family at bedside  Goal: Optimal Comfort and Wellbeing  Outcome: Progressing  Goal: Readiness for Transition of Care  Outcome: Progressing  Goal: Rounds/Family Conference  Outcome: Progressing     Problem: Infection  Goal: Absence of Infection Signs and Symptoms  Outcome: Progressing     Problem: Latex Allergy  Goal: Absence of Allergy Symptoms  Outcome: Progressing     Problem: Impaired Wound Healing  Goal: Optimal Wound Healing  Outcome: Progressing

## 2022-05-26 NOTE — Unmapped (Signed)
Care Management  Initial Transition Planning Assessment              General  Care Manager assessed the patient by : In person interview with patient, Medical record review, Discussion with Clinical Care team  Orientation Level: Oriented X4  Functional level prior to admission: Independent (uses a cane for outdoor ambulation)  Who provides care at home?: Family member  Reason for referral: Discharge Planning    CM met w/ pt to complete assessment. She is A&Ox4, independent and driving prior to admission. She does use a SPC for outdoor ambulation. States she has been getting up and moving about her room without issue.     Per MD note pt is a 66 y.o. 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    Pt lives in [place] w/ her 3 adult grandchildren. Her daughter, Helmut Muster, lives 12 miles away. She states someone is w/ her at all times.     Ms Lucy Antigua states her daughter, Helmut Muster, would be her emergency contact if needed.     No anticipated dc needs.     Contact/Decision Maker  Extended Emergency Contact Information  Primary Emergency Contact: Livia Snellen [place] [employer] Phone: [phone]  Relation: Daughter  Preferred language: ENGLISH  Interpreter needed? No  Secondary Emergency Contact: Galloway,Randel   [place] [employer] Phone: [phone]  Relation: Brother  Preferred language: ENGLISH  Interpreter needed? No    Legal Next of Kin / Guardian / POA / Advance Directives       Advance Directive (Medical Treatment)  Does patient have an advance directive covering medical treatment?: Patient does not have advance directive covering medical treatment.    Health Care Decision Maker [HCDM] (Medical & Mental Health Treatment)  Healthcare Decision Maker: Patient needs follow-up to appoint a Health Care Decision Maker.  Information offered on HCDM, Medical & Mental Health advance directives:: Other (Comment)         Readmission Information    Have you been hospitalized in the last 30 days?: No    Patient Information  Lives with: Family members (Pt lives w/ her 3 grandchildren: 49, 39, and 55.  She states someone is with her at all times. Her daughter lives 12 miles away)    Type of Residence: Private residence  Type of Residence: Mailing Address:    [address]  [place] [address]  Patient Phone Number:   [phone] (home)   [phone] (mobile)        Medical Provider(s): Consuella Lose, MD    Previous admit date: 09/12/2017    Primary Insurance- Payor: HUMANA MEDICARE ADV / Plan: HUMANA GOLD PLUS HMO / Product Type: *No Product type* /   Secondary Insurance - Secondary Insurance  MEDICAID [place]  Prescription Coverage - Above  Preferred Pharmacy - [facility]  [facility]  [facility] [place] - [place], [place] - [phone]  [facility] - [place], [place] - [address]    Transportation home: Private vehicle    Support Systems/Concerns: Children, Family Members, Friends/Neighbors    Responsibilities/Dependents at home?: No    Home Care services in place prior to admission?: No      Outpatient/Community Resources in place prior to admission: Clinic  Agency detail (Name/Phone #): [facility] Liver (Dr Sherryll Burger) and Kidney (Dr Margaretmary Bayley) transplant clinics    Equipment  Currently Used at Home:   cane, straight (uses a cane for outdoor ambulation. Has a BSC and walker at home if needed)       Currently receiving outpatient dialysis?: No       Financial Information       Need for financial assistance?: No       Social Determinants of Health  Food Insecurity: No Food Insecurity    Worried About Running Out of Food in the Last Year: Never true    Ran Out of Food in the Last Year: Never true      Financial Resource Strain: Low Risk     Difficulty of Paying Living Expenses: Not very hard     Housing/Utilities: Low Risk     Within the past 12 months, have you ever stayed: outside, in a car, in a tent, in an overnight shelter, or temporarily in someone else's home (i.e. couch-surfing)?: No    Are you worried about losing your housing?: No    Within the past 12 months, have you been unable to get utilities (heat, electricity) when it was really needed?: No     Transportation Needs: No Transportation Needs    Lack of Transportation (Medical): No    Lack of Transportation (Non-Medical): No         Complex Discharge Information    Is patient identified as a difficult/complex discharge?: No      Discharge Needs Assessment  Concerns to be Addressed: denies needs/concerns at this time, no discharge needs identified    Clinical Risk Factors: > 65, New Diagnosis, Multiple Diagnoses (Chronic), Other (Comment) (transplant patient)    Barriers to taking medications: No    Prior overnight hospital stay or ED visit in last 90 days: No      Anticipated Changes Related to Illness: none    Equipment Needed After Discharge: none    Discharge Facility/Level of Care Needs: other (see comments) (anticipate home w/ self care)    Readmission  Risk of Unplanned Readmission Score: UNPLANNED READMISSION SCORE: 18.79%  Predictive Model Details          19% (Medium)  Factor Value    Calculated 05/26/2022 08:04 17% Number of active Rx orders 23    [place] Risk of Unplanned Readmission Model 10% ECG/EKG order present in last 6 months     10% Latest calcium low (8.1 mg/dL)     8% Latest BUN high (42 mg/dL)     8% Diagnosis of electrolyte disorder present     7% Imaging order present in last 6 months     6% Phosphorous result present     6% Number of ED visits in last six months 1     6% Age 36     5% Charlson Comorbidity Index 4     5% Active anticoagulant Rx order present     5% Active corticosteroid Rx order present     5% Latest creatinine high (1.16 mg/dL)     4% Diagnosis of renal failure present     1% Active ulcer medication Rx order present     1% Current length of stay 0.684 days      Readmitted Within the Last 30 Days? (No if blank)   Patient at risk for readmission?: Yes    Discharge Plan  Screen findings are: Care Manager reviewed the plan of the patient's care with the Multidisciplinary Team. No discharge planning needs identified at this time. Care Manager will continue to manage plan  and monitor patient's progress with the team.    Quality data for continuing care services shared with patient and/or representative?: N/A  Patient and/or family were provided with choice of facilities / services that are available and appropriate to meet post hospital care needs?: N/A       Initial Assessment complete?: Yes

## 2022-05-26 NOTE — Unmapped (Cosign Needed)
Nephrology ([facility]) Progress Note    Assessment & Plan:   Michelle Travis is a 66 y.o. female whose presentation is complicated by PMHx of liver-kidney transplant 2018 that presented to [facility] with acute diarrheal illness c/b acute kidney injury.     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 *      Active Problems    Diarrhea - Immunocompromised   Non-bloody watery diarrhea, abdominal cramps, and nausea started on 7/22 after eating out the evening of 7/21. She is averaging 8-10 BM per day, that are watery in nature. Possible positive Rotavirus test from outside hospital. Concern for viral etiology from restaurant. Patient reports feeling much better 7/28. IgG slightly low at 636.   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 Decrease PO intake in setting of diarrheal illness. Baseline creatinine <1. Creatinine on admission 2.45. Likely pre-renal etiology in setting of diarrheal illness and decreased PO intake. Urine analysis concerning for possible UTI.   - Maintenance IVF LR 32mL/ hour  - Holding myfortic in setting of infection   - Continue tacrolimus 2mg  daily   - Continue 5 mg prednisone  - Fu tacrolimus trough daily; may need to adjust dosage in setting of diarrhea  - CTM renal function panel daily  - Fu Ucx     Chronic Problems     Osteopenia  - Continue home cholecalciferol      Chronic Pain   - Continue buprenorphine      Hypercholesterolemia  - Continue home atorvastatin         Issues Impacting Complexity of Management:  -Intensive monitoring of drug toxicity from Tacrolimus with scheduled levels      Daily Checklist:  Diet: Regular Diet  DVT PPx: Heparin 5000units q8h  Electrolytes: Magnesium Repleted  Code Status: Full Code  Dispo:  Inpatient needed due to management of diarrheal illness    Team Contact Information:   Primary Team: Nephrology ([facility])  Primary Resident: Marguerita Beards, MD  Resident's Pager: [phone] (Nephrology Intern - Alvester Morin)    Interval History:   No acute events overnight.  Patient reports feeling much better this morning. Continues to have diarrhea episodes, but able to drink and eat.   ROS: Denies headache, chest pain, shortness of breath, abdominal pain, nausea, vomiting.    Objective:   Temp:  [35.8 ??C (96.5 ??F)-36.5 ??C (97.7 ??F)] 36.5 ??C (97.7 ??F)  Heart Rate:  [44-52] 44  Resp:  [16-18] 18  BP: (118-138)/(69-78) 138/78  SpO2:  [98 %-100 %] 99 %    Gen: NAD, converses   HENT: atraumatic, normocephalic  Heart: RRR  Lungs: CTAB, no crackles or wheezes  Abdomen: soft, NTND  Extremities: No edema

## 2022-05-27 LAB — RENAL FUNCTION PANEL
ALBUMIN: 2.9 g/dL — ABNORMAL LOW (ref 3.4–5.0)
ANION GAP: 6 mmol/L (ref 5–14)
BLOOD UREA NITROGEN: 27 mg/dL — ABNORMAL HIGH (ref 9–23)
BUN / CREAT RATIO: 40
CALCIUM: 8.7 mg/dL (ref 8.7–10.4)
CHLORIDE: 114 mmol/L — ABNORMAL HIGH (ref 98–107)
CO2: 25 mmol/L (ref 20.0–31.0)
CREATININE: 0.68 mg/dL
EGFR CKD-EPI (2021) FEMALE: >90 mL/min/{1.73_m2} (ref >=60)
GLUCOSE RANDOM: 98 mg/dL (ref 70–179)
PHOSPHORUS: 2.3 mg/dL — ABNORMAL LOW (ref 2.4–5.1)
POTASSIUM: 3.9 mmol/L (ref 3.4–4.8)
SODIUM: 145 mmol/L (ref 135–145)

## 2022-05-27 LAB — MAGNESIUM: MAGNESIUM: 1.7 mg/dL (ref 1.6–2.6)

## 2022-05-27 LAB — CBC
HEMATOCRIT: 37.1 % (ref 34.0–44.0)
HEMOGLOBIN: 12.5 g/dL (ref 11.3–14.9)
MEAN CORPUSCULAR HEMOGLOBIN CONC: 33.6 g/dL (ref 32.0–36.0)
MEAN CORPUSCULAR HEMOGLOBIN: 26.4 pg (ref 25.9–32.4)
MEAN CORPUSCULAR VOLUME: 78.5 fL (ref 77.6–95.7)
MEAN PLATELET VOLUME: 9 fL (ref 6.8–10.7)
PLATELET COUNT: 307 10*9/L (ref 150–450)
RED BLOOD CELL COUNT: 4.73 10*12/L (ref 3.95–5.13)
RED CELL DISTRIBUTION WIDTH: 15.2 % (ref 12.2–15.2)
WBC ADJUSTED: 3.9 10*9/L (ref 3.6–11.2)

## 2022-05-27 LAB — TACROLIMUS LEVEL, TROUGH: TACROLIMUS, TROUGH: 3.3 ng/mL — ABNORMAL LOW (ref 5.0–15.0)

## 2022-05-27 MED ADMIN — predniSONE (DELTASONE) tablet 5 mg: 5 mg | ORAL | @ 14:00:00 | Stop: 2022-05-27

## 2022-05-27 MED ADMIN — pantoprazole (PROTONIX) EC tablet 40 mg: 40 mg | ORAL | @ 14:00:00 | Stop: 2022-05-27

## 2022-05-27 MED ADMIN — potassium & sodium phosphates 250mg (PHOS-NAK/NEUTRA PHOS) packet 1 packet: 1 | ORAL | @ 14:00:00 | Stop: 2022-05-27

## 2022-05-27 MED ADMIN — tacrolimus (ENVARSUS XR) extended release tablet 2 mg: 2 mg | ORAL | @ 14:00:00 | Stop: 2022-05-27

## 2022-05-27 MED ADMIN — atorvastatin (LIPITOR) tablet 10 mg: 10 mg | ORAL | @ 14:00:00 | Stop: 2022-05-27

## 2022-05-27 MED ADMIN — cholecalciferol (vitamin D3 25 mcg (1,000 units)) tablet 25 mcg: 25 ug | ORAL | @ 14:00:00 | Stop: 2022-05-27

## 2022-05-27 NOTE — Unmapped (Signed)
Patient discharged to home. Reviewed discharge instructions, medications, prescriptions, and follow up appointments with patient. All questions and concerns were addressed. PIV removed. AVS and all belongings with patient.     Problem: Adult Inpatient Plan of Care  Goal: Plan of Care Review  Outcome: Resolved  Goal: Patient-Specific Goal (Individualized)  Outcome: Resolved  Goal: Absence of Hospital-Acquired Illness or Injury  Outcome: Resolved  Intervention: Identify and Manage Fall Risk  Recent Flowsheet Documentation  Taken 05/27/2022 1300 by Frutoso Chase, RN  Safety Interventions: low bed  Goal: Optimal Comfort and Wellbeing  Outcome: Resolved  Goal: Readiness for Transition of Care  Outcome: Resolved  Goal: Rounds/Family Conference  Outcome: Resolved     Problem: Infection  Goal: Absence of Infection Signs and Symptoms  Outcome: Resolved     Problem: Latex Allergy  Goal: Absence of Allergy Symptoms  Outcome: Resolved     Problem: Fluid and Electrolyte Imbalance (Acute Kidney Injury/Impairment)  Goal: Fluid and Electrolyte Balance  Outcome: Resolved     Problem: Oral Intake Inadequate (Acute Kidney Injury/Impairment)  Goal: Optimal Nutrition Intake  Outcome: Resolved     Problem: Renal Function Impairment (Acute Kidney Injury/Impairment)  Goal: Effective Renal Function  Outcome: Resolved

## 2022-05-27 NOTE — Unmapped (Signed)
Physician Discharge Summary [facility]  [address] [facility]  [address]  [place] [address]  Dept: [phone]  Loc: [phone]     Identifying Information:   JENNIFERANNE REDA  1956/09/03  [id number]    Primary Care Physician: Consuella Lose, MD     Code Status: Full Code    Admit Date: 05/25/2022    Discharge Date: 05/27/2022     Discharge To: Home    Discharge Service: [facility] - Nephrology Floor Team (MED B Alvester Morin)     Discharge Attending Physician: Zollie Pee, M.D.     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 Margaretmary Bayley      [ ]  Discuss her prednisone with transplant nephrology        Hospital Course:  Ms. Krzywicki is a 66 y.o. female with a pmhx of liver-kidney transplant 2019 c/b recurrent membranous nephropathy, admitted for diarrheal illness and AKI.    Rotavirus Enteritis   Patient ate out 7/21 and began experiencing diarrhea on 7/22. She was averaging 8-10 watery bowel movements per day. Presented to OSH on 7/22 and supposedly tested positive for rotavirus. Patient has been experiencing nausea but not vomiting. Fluid resuscitation throughout admission. GI panel shows rotavirus. On discharge date, patient's bowel movements were more well-formed and she was tolerating a diet without issue.      AKI, resolved- Liver-Kidney Transplant   Baseline creatinine <1. On admission, creatinine 2.45. Likely pre-renal etiology in setting of decreased PO intake due to nausea and diarrhea from rotavirus infection per above. Fluid resuscitation throughout admission with improving creatinine. Creatinine on discharge was back to baseline. Her mycophenolate was held on discharge to assist with recovery of viral infection and she will resume this at half dose (180 mg BID) until follow-up with Dr. Margaretmary Bayley. She was also started on prednisone 5 mg in the interim which she will continue until follow-up. No changes were made to her Envarsus.       The patient's hospital stay has been complicated by the following clinically significant conditions requiring additional evaluation and treatment or having a significant effect of this patient's care: - Chronic kidney disease POA requiring further investigation, treatment, or monitoring  - Acidosis POA requiring further investigation, treatment, or monitoring  - Dehydration POA requiring further investigation, treatment, or monitoring       Touchbase with Outpatient Provider:  Warm Handoff: Completed on 05/27/22 by Mack Guise  (Resident) via In-Basket Message    Procedures:  None  ______________________________________________________________________  Discharge Medications:   Select AVSMEDLIST after discharge medrec is done (then delete this red text)     Your Medication List        START taking these medications      predniSONE 5 MG tablet  Commonly known as: DELTASONE  Take 1 tablet (5 mg total) by mouth in the morning.  Start taking  on: May 28, 2022            CHANGE how you take these medications      mycophenolate 180 MG EC tablet  Commonly known as: MYFORTIC  Take 1 tablet (180 mg total) by mouth two (2) times a day. Until you follow-up with your transplant nephrologist.  Start taking on: June 03, 2022  What changed:   how much to take  additional instructions  These instructions start on June 03, 2022. If you are unsure what to do until then, ask your doctor or other care provider.            CONTINUE taking these medications      acetaminophen 650 MG CR tablet  Commonly known as: TYLENOL 8 HOUR  Take 1 tablet (650 mg total) by mouth every four (4) hours as needed for pain.     aspirin 81 MG chewable tablet  Chew 1 tablet (81 mg total) daily.     atorvastatin 10 MG tablet  Commonly known as: LIPITOR  Take 1 tablet (10 mg total) by mouth daily.     BELBUCA 300 mcg buccal film  Generic drug: buprenorphine  Apply 1 Film (300 mcg total) to cheek at bedtime.     cholecalciferol (vitamin D3 25 mcg (1,000 units)) 1,000 unit (25 mcg) tablet  Take 1 tablet (25 mcg total) by mouth in the morning.     ENVARSUS XR 1 mg Tb24 extended release tablet  Generic drug: tacrolimus  Take 2 tablets (2 mg total) by mouth in the morning. MAP Renewal.     loperamide 2 mg capsule  Commonly known as: IMODIUM  Take 1 capsule (2 mg total) by mouth four (4) times a day as needed.     ondansetron 4 MG disintegrating tablet  Commonly known as: ZOFRAN-ODT  Take 1 tablet (4 mg total) by mouth every eight (8) hours as needed for nausea.     oxyCODONE 10 mg immediate release tablet  Commonly known as: ROXICODONE  TAKE 1 (ONE) TABLET BY MOUTH FOUR TIMES DAILY, AS NEEDED     pantoprazole 40 MG tablet  Commonly known as: PROTONIX  Take 1 tablet (40 mg total) by mouth daily.              Allergies:  Latex, Hydrocodone, and Hydrocodone-acetaminophen  ______________________________________________________________________  Pending Test Results:  Pending Labs       Order Current Status    Blood Culture Preliminary result    Blood Culture Preliminary result            Most Recent Labs:  All lab results last 24 hours -   Recent Results (from the past 24 hour(s))   ECG 12 Lead    Collection Time: 05/26/22  3:11 PM   Result Value Ref Range    EKG Systolic BP  mmHg    EKG Diastolic BP  mmHg    EKG Ventricular Rate 45 BPM    EKG Atrial Rate 45 BPM    EKG P-R Interval 196 ms    EKG QRS Duration 102 ms    EKG Q-T Interval 494 ms    EKG QTC Calculation 427 ms    EKG Calculated P Axis 48 degrees    EKG Calculated R Axis 9 degrees    EKG Calculated T Axis 166 degrees    QTC Fredericia 448 ms   Tacrolimus Level, Trough    Collection Time: 05/27/22  5:22 AM   Result Value Ref  Range    Tacrolimus, Trough 3.3 (L) 5.0 - 15.0 ng/mL   Renal Function Panel    Collection Time: 05/27/22  5:22 AM   Result Value Ref Range    Sodium 145 135 - 145 mmol/L    Potassium 3.9 3.4 - 4.8 mmol/L    Chloride 114 (H) 98 - 107 mmol/L    CO2 25.0 20.0 - 31.0 mmol/L    Anion Gap 6 5 - 14 mmol/L    BUN 27 (H) 9 - 23 mg/dL    Creatinine 1.61 0.96 - 0.80 mg/dL    BUN/Creatinine Ratio 40     eGFR CKD-EPI (2021) Female >90 >=60 mL/min/1.17m2    Glucose 98 70 - 179 mg/dL    Calcium 8.7 8.7 - 04.5 mg/dL    Phosphorus 2.3 (L) 2.4 - 5.1 mg/dL    Albumin 2.9 (L) 3.4 - 5.0 g/dL   Magnesium Level    Collection Time: 05/27/22  5:22 AM   Result Value Ref Range    Magnesium 1.7 1.6 - 2.6 mg/dL   CBC    Collection Time: 05/27/22  5:22 AM   Result Value Ref Range    WBC 3.9 3.6 - 11.2 10*9/L    RBC 4.73 3.95 - 5.13 10*12/L    HGB 12.5 11.3 - 14.9 g/dL    HCT 40.9 81.1 - 91.4 %    MCV 78.5 77.6 - 95.7 fL    MCH 26.4 25.9 - 32.4 pg    MCHC 33.6 32.0 - 36.0 g/dL    RDW 78.2 95.6 - 21.3 %    MPV 9.0 6.8 - 10.7 fL    Platelet 307 150 - 450 10*9/L       Relevant Studies/Radiology:  ECG 12 Lead    Result Date: 05/26/2022  SINUS BRADYCARDIA VOLTAGE CRITERIA FOR LEFT VENTRICULAR HYPERTROPHY ( R in aVL , Sokolow-Lyon , [employer] product ) MARKED ST ABNORMALITY, POSSIBLE LATERAL SUBENDOCARDIAL INJURY ABNORMAL ECG WHEN COMPARED WITH ECG OF 25-May-2022 13:10, T WAVE INVERSION MORE EVIDENT IN ANTERIOR LEADS    ECG 12 Lead    Result Date: 05/25/2022  SINUS BRADYCARDIA LEFT VENTRICULAR HYPERTROPHY WITH REPOLARIZATION ABNORMALITY ABNORMAL ECG WHEN COMPARED WITH ECG OF 20-Sep-2017 20:52, NO SIGNIFICANT CHANGE WAS FOUND Confirmed by Huel Coventry [phone]) on 05/25/2022 3:26:16 PM    XR Chest 2 views    Result Date: 05/25/2022  EXAM: XR CHEST 2 VIEWS DATE: 05/25/2022 9:23 AM ACCESSION: [id number] UN DICTATED: 05/25/2022 9:25 AM INTERPRETATION LOCATION: [facility] CLINICAL INDICATION: 66 years old Female with [place] ; KIDNEY TRANSPLANT  - Z94.0 - Kidney transplanted  TECHNIQUE: PA and Lateral Chest Radiographs. COMPARISON: 02/28/2021. FINDINGS: Lungs are clear. No pleural effusion or pneumothorax. Cardiac silhouette is normal in size. Thoracic aorta is tortuous. Surgical clips in the right axilla.     No acute airspace disease.    US Renal Transplant W Native Kidneys    Result Date: 05/25/2022  EXAM: US RENAL TRANSPLANT W NATIVE KIDNEYS DATE: 05/25/2022 ACCESSION: [id number] UN DICTATED: 05/25/2022 10:06 AM INTERPRETATION LOCATION: [facility] CLINICAL INDICATION: 66 years old Female with s/p kidney transplant  - Z94.0 - Kidney transplanted COMPARISON: Renal ultrasound 03/07/2021 and 02/28/2021 TECHNIQUE:  Ultrasound views of the renal transplant were obtained using gray scale and color and spectral Doppler imaging. Views of the native kidneys and urinary bladder were obtained using gray scale and limited color Doppler imaging. FINDINGS: NATIVE KIDNEYS: The native kidneys are poorly visualized and are likely small and echogenic. No solid masses or calculi.  No hydronephrosis. TRANSPLANTED KIDNEY: The renal transplant was located in the left lower quadrant. Normal size and echogenicity.  No solid masses or calculi. No perinephric collections identified. No hydronephrosis. VESSELS: - Perfusion: Using power Doppler, normal perfusion was seen throughout the renal parenchyma. - Resistive indices in the renal transplant are mildly increased compared with prior examination, borderline elevated. - Main renal artery/iliac artery: Patent - Main renal vein/iliac vein: Patent BLADDER: Decompressed, limiting evaluation     -Left lower quadrant transplant kidney with patent vasculature -Mild interval increase in resistive indices of the main transplant renal artery, now mildly elevated. Please see below for data measurements: Transplant location: LLQ Renal Transplant: Sagittal 13.2 cm; AP 6.0 cm; Transverse 6.7 cm Segmental artery superior resistive index: 0.75-0.76 Segmental artery mid resistive index: 0.75-0.78 Segmental artery inferior resistive index: 0.82 Previous resistive indices range of segmental arteries: 0.71-0.74 Main renal artery peak systolic velocity at anastomosis: 1.13 m/s Main renal artery hilum resistive index: 0.84-0.86 Main renal artery mid resistive index: 0.83-0.84 Main renal artery anastomosis resistive index: 0.82-0.85 Previous resistive indices range of main renal artery: 0.74-0.78 Main renal vein: patent Iliac artery: Patent Iliac vein: Patent Bladder volume prevoid: 28.3  mL Native right kidney: 6.2 cm Native left kidney: 6.8 cm    ______________________________________________________________________  Discharge Instructions:   Activity Instructions       Activity as tolerated            You were admitted to [facility] with rotavirus infection and a resulting acute kidney injury. We gave you fluids for this and your kidney function looked much better. If for some reason you begin to have profuse diarrhea again and are unable to take in fluids or food please do not hesitate to come back to the ED.   Please follow-up with your transplant kidney doctor as soon as possible.    You are now ready to discharge and will be discharging to: Home  Please take your medications as prescribed. Medication changes are listed below and a full list of medications is in this discharge packet. Please keep your follow-up appointments after the hospital for ongoing care. It has been a pleasure taking care of you, we wish you the best.   MEDICATION CHANGES: -- STOP your Mycophenolate for the next 7 days then RESTART at HALF DOSE 180 mg twice a day until you meet with your transplant nephrologist  -- CONTINUE Prednisone 5 mg until you meet with Dr. Margaretmary Bayley   FOLLOW-UP: -- It is important to see your primary care provider (PCP) after being in the hospital. Your PCP is Consuella Lose, MD and the phone number of their office is [phone]. Please call your PCP's office as soon as possible to schedule an appointment with them.  -- PLEASE call to schedule an appointment with Dr. Margaretmary Bayley as soon as possible. Please tell them you were just in the hospital and need a quick follow-up! Po              Follow Up instructions and Outpatient Referrals     Call MD for:  difficulty breathing, headache or visual disturbances      Call MD for:  persistent nausea or vomiting      Call MD for:  severe uncontrolled pain      Call MD for:  temperature >38.5 Celsius      Discharge instructions            ______________________________________________________________________  Discharge Day Services:  BP 122/78  -  Pulse 58  - Temp 36.8 ??C (98.2 ??F) (Oral)  - Resp 18  - Ht 160 cm (5' 3)  - Wt 78.5 kg (173 lb 1.6 oz)  - LMP  (LMP Unknown)  - SpO2 99%  - BMI 30.66 kg/m??     Pt seen on the day of discharge and determined appropriate for discharge.    Condition at Discharge: stable    Length of Discharge: I spent greater than 30 mins in the discharge of this patient.

## 2022-05-27 NOTE — Unmapped (Signed)
Pt free of falls. Bradycardiac but systematic. MD notified. EKG obtained. Other VSS. Stool and urine sample sent to lab. Mag replacement provided. LR running at 20ml/hr. I/O monitored. Up to bathroom independently.   Problem: Adult Inpatient Plan of Care  Goal: Plan of Care Review  Outcome: Progressing  Goal: Patient-Specific Goal (Individualized)  Outcome: Progressing  Goal: Absence of Hospital-Acquired Illness or Injury  Outcome: Progressing  Intervention: Identify and Manage Fall Risk  Recent Flowsheet Documentation  Taken 05/26/2022 0800 by Janett Labella, RN  Safety Interventions:   low bed   fall reduction program maintained   nonskid shoes/slippers when out of bed  Intervention: Prevent Skin Injury  Recent Flowsheet Documentation  Taken 05/26/2022 0800 by Janett Labella, RN  Skin Protection: adhesive use limited  Intervention: Prevent and Manage VTE (Venous Thromboembolism) Risk  Recent Flowsheet Documentation  Taken 05/26/2022 0830 by Janett Labella, RN  VTE Prevention/Management: ambulation promoted  Intervention: Prevent Infection  Recent Flowsheet Documentation  Taken 05/26/2022 0800 by Janett Labella, RN  Infection Prevention: single patient room provided  Goal: Optimal Comfort and Wellbeing  Outcome: Progressing  Goal: Readiness for Transition of Care  Outcome: Progressing  Goal: Rounds/Family Conference  Outcome: Progressing     Problem: Infection  Goal: Absence of Infection Signs and Symptoms  Outcome: Progressing  Intervention: Prevent or Manage Infection  Recent Flowsheet Documentation  Taken 05/26/2022 0800 by Janett Labella, RN  Infection Management: aseptic technique maintained  Isolation Precautions: enteric precautions maintained     Problem: Latex Allergy  Goal: Absence of Allergy Symptoms  Outcome: Progressing     Problem: Impaired Wound Healing  Goal: Optimal Wound Healing  Outcome: Resolved     Problem: Fluid and Electrolyte Imbalance (Acute Kidney Injury/Impairment)  Goal: Fluid and Electrolyte Balance  Outcome: Progressing     Problem: Oral Intake Inadequate (Acute Kidney Injury/Impairment)  Goal: Optimal Nutrition Intake  Outcome: Progressing     Problem: Renal Function Impairment (Acute Kidney Injury/Impairment)  Goal: Effective Renal Function  Outcome: Progressing

## 2022-05-27 NOTE — Unmapped (Signed)
Patient free from falls and injury this shift. VSS. RA. A&O x4. Independent. No complaints of pain. On enteric precautions, pending GI PP to result. New PIV placed. Fluids stopped this shift. Call bell and bedside table within reach. Low bed. No other needs verbalized at this time.       Problem: Adult Inpatient Plan of Care  Goal: Plan of Care Review  Outcome: Progressing  Goal: Patient-Specific Goal (Individualized)  Outcome: Progressing  Goal: Absence of Hospital-Acquired Illness or Injury  Outcome: Progressing  Intervention: Identify and Manage Fall Risk  Recent Flowsheet Documentation  Taken 05/26/2022 2000 by Emilio Aspen, RN  Safety Interventions:   aspiration precautions   fall reduction program maintained   lighting adjusted for tasks/safety   low bed   isolation precautions   nonskid shoes/slippers when out of bed  Intervention: Prevent and Manage VTE (Venous Thromboembolism) Risk  Recent Flowsheet Documentation  Taken 05/26/2022 2041 by Emilio Aspen, RN  VTE Prevention/Management: ambulation promoted  Taken 05/26/2022 2000 by Emilio Aspen, RN  Activity Management: activity adjusted per tolerance  Intervention: Prevent Infection  Recent Flowsheet Documentation  Taken 05/26/2022 2000 by Emilio Aspen, RN  Infection Prevention:   cohorting utilized   environmental surveillance performed   equipment surfaces disinfected   hand hygiene promoted   personal protective equipment utilized   rest/sleep promoted   single patient room provided   visitors restricted/screened  Goal: Optimal Comfort and Wellbeing  Outcome: Progressing  Goal: Readiness for Transition of Care  Outcome: Progressing  Goal: Rounds/Family Conference  Outcome: Progressing     Problem: Infection  Goal: Absence of Infection Signs and Symptoms  Outcome: Progressing  Intervention: Prevent or Manage Infection  Recent Flowsheet Documentation  Taken 05/26/2022 2000 by Emilio Aspen, RN  Infection Management: aseptic technique maintained  Isolation Precautions: enteric precautions maintained     Problem: Latex Allergy  Goal: Absence of Allergy Symptoms  Outcome: Progressing     Problem: Fluid and Electrolyte Imbalance (Acute Kidney Injury/Impairment)  Goal: Fluid and Electrolyte Balance  Outcome: Progressing     Problem: Oral Intake Inadequate (Acute Kidney Injury/Impairment)  Goal: Optimal Nutrition Intake  Outcome: Progressing     Problem: Renal Function Impairment (Acute Kidney Injury/Impairment)  Goal: Effective Renal Function  Outcome: Progressing

## 2022-05-28 MED ORDER — PREDNISONE 5 MG TABLET
ORAL_TABLET | Freq: Every day | ORAL | 0 refills | 30 days | Status: CP
Start: 2022-05-28 — End: 2022-06-27

## 2022-05-29 ENCOUNTER — Ambulatory Visit (HOSPITAL_COMMUNITY)
Admission: RE | Admit: 2022-05-29 | Discharge: 2022-05-29 | Disposition: A | Discharge status: Home / Self Care | Payer: Medicare HMO | Source: Ambulatory Visit | Attending: Internal Medicine | Admitting: Internal Medicine

## 2022-05-29 DIAGNOSIS — Z1231 Encounter for screening mammogram for malignant neoplasm of breast: Secondary | ICD-10-CM | POA: Insufficient documentation

## 2022-05-30 NOTE — Unmapped (Signed)
Call placed to patient to follow-up post discharge from recent hospitalization - she confirms she started prednisone and is holding myfortic (resuming 180.mg BID on 8/5 per discharge orders) - will repeat labs next week to ensure labs are stable with changes in immunosuppression and is aware of nephrology follow-up visit on 8/24.

## 2022-06-04 DIAGNOSIS — Z940 Kidney transplant status: Principal | ICD-10-CM

## 2022-06-04 DIAGNOSIS — N022 Recurrent and persistent hematuria with diffuse membranous glomerulonephritis: Principal | ICD-10-CM

## 2022-06-04 DIAGNOSIS — N042 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: Principal | ICD-10-CM

## 2022-06-04 DIAGNOSIS — D849 Immunodeficiency, unspecified: Principal | ICD-10-CM

## 2022-06-04 DIAGNOSIS — Z48298 Encounter for aftercare following other organ transplant: Principal | ICD-10-CM

## 2022-06-05 DIAGNOSIS — Z79899 Other long term (current) drug therapy: Principal | ICD-10-CM

## 2022-06-05 DIAGNOSIS — Z940 Kidney transplant status: Principal | ICD-10-CM

## 2022-06-05 DIAGNOSIS — Z1159 Encounter for screening for other viral diseases: Principal | ICD-10-CM

## 2022-06-06 LAB — CBC W/ DIFFERENTIAL
BANDED NEUTROPHILS ABSOLUTE COUNT: 0 10*3/uL (ref 0.0–0.1)
BASOPHILS ABSOLUTE COUNT: 0.1 10*3/uL (ref 0.0–0.2)
BASOPHILS RELATIVE PERCENT: 1 %
EOSINOPHILS ABSOLUTE COUNT: 0.2 10*3/uL (ref 0.0–0.4)
EOSINOPHILS RELATIVE PERCENT: 4 %
HEMATOCRIT: 40.7 % (ref 34.0–46.6)
HEMOGLOBIN: 13.1 g/dL (ref 11.1–15.9)
IMMATURE GRANULOCYTES: 0 %
LYMPHOCYTES ABSOLUTE COUNT: 1.2 10*3/uL (ref 0.7–3.1)
LYMPHOCYTES RELATIVE PERCENT: 29 %
MEAN CORPUSCULAR HEMOGLOBIN CONC: 32.2 g/dL (ref 31.5–35.7)
MEAN CORPUSCULAR HEMOGLOBIN: 26.7 pg (ref 26.6–33.0)
MEAN CORPUSCULAR VOLUME: 83 fL (ref 79–97)
MONOCYTES ABSOLUTE COUNT: 0.5 10*3/uL (ref 0.1–0.9)
MONOCYTES RELATIVE PERCENT: 13 %
NEUTROPHILS ABSOLUTE COUNT: 2.2 10*3/uL (ref 1.4–7.0)
NEUTROPHILS RELATIVE PERCENT: 53 %
PLATELET COUNT: 314 10*3/uL (ref 150–450)
RED BLOOD CELL COUNT: 4.9 x10E6/uL (ref 3.77–5.28)
RED CELL DISTRIBUTION WIDTH: 14.1 % (ref 11.7–15.4)
WHITE BLOOD CELL COUNT: 4.1 10*3/uL (ref 3.4–10.8)

## 2022-06-07 LAB — BASIC METABOLIC PANEL
BLOOD UREA NITROGEN: 10 mg/dL (ref 8–27)
BUN / CREAT RATIO: 19 (ref 12–28)
CALCIUM: 8.6 mg/dL — ABNORMAL LOW (ref 8.7–10.3)
CHLORIDE: 105 mmol/L (ref 96–106)
CO2: 26 mmol/L (ref 20–29)
CREATININE: 0.53 mg/dL — ABNORMAL LOW (ref 0.57–1.00)
EGFR: 102 mL/min/{1.73_m2}
GLUCOSE: 87 mg/dL (ref 70–99)
POTASSIUM: 4.3 mmol/L (ref 3.5–5.2)
SODIUM: 145 mmol/L — ABNORMAL HIGH (ref 134–144)

## 2022-06-07 LAB — MICROSCOPIC EXAMINATION
BACTERIA: NONE SEEN
CASTS: NONE SEEN /LPF

## 2022-06-07 LAB — URINALYSIS WITH MICROSCOPY
BILIRUBIN UA: NEGATIVE
BLOOD UA: NEGATIVE
GLUCOSE UA: NEGATIVE
KETONES UA: NEGATIVE
LEUKOCYTE ESTERASE UA: NEGATIVE
NITRITE UA: NEGATIVE
PH UA: 5.5 (ref 5.0–7.5)
SPECIFIC GRAVITY UA: 1.018 (ref 1.005–1.030)
UROBILINOGEN UA: 0.2 mg/dL (ref 0.2–1.0)

## 2022-06-07 LAB — MAGNESIUM: MAGNESIUM: 1.5 mg/dL — ABNORMAL LOW (ref 1.6–2.3)

## 2022-06-07 LAB — PROTEIN / CREATININE RATIO, URINE
CREATININE URINE: 109.1 mg/dL
PROTEIN URINE: 219.6 mg/dL
PROTEIN/CREAT RATIO: 2013 mg/g{creat} — ABNORMAL HIGH (ref 0–200)

## 2022-06-07 LAB — PHOSPHORUS: PHOSPHORUS, SERUM: 2.8 mg/dL — ABNORMAL LOW (ref 3.0–4.3)

## 2022-06-08 LAB — TACROLIMUS LEVEL: TACROLIMUS BLOOD: 2.3 ng/mL (ref 2.0–20.0)

## 2022-06-09 DIAGNOSIS — Z796 Long-term use of immunosuppressant medication: Principal | ICD-10-CM

## 2022-06-09 DIAGNOSIS — Z944 Liver transplant status: Principal | ICD-10-CM

## 2022-06-09 MED ORDER — ENVARSUS XR 1 MG TABLET,EXTENDED RELEASE
ORAL_TABLET | Freq: Every day | ORAL | 11 refills | 30 days | Status: CP
Start: 2022-06-09 — End: 2023-06-09

## 2022-06-09 MED ORDER — ENVARSUS XR 0.75 MG TABLET,EXTENDED RELEASE
ORAL_TABLET | Freq: Every day | ORAL | 11 refills | 30 days | Status: CP
Start: 2022-06-09 — End: 2023-06-09

## 2022-06-09 NOTE — Unmapped (Addendum)
Reviewed tacrolimus level of 2.3 with [facility]iva CPP who stated to have the pt increase the Envarsus to 2.5 mg daily.    Called the pt and reviewed the tacrolimus level. Informed her that she will need to increase the Envarsus to 2.5 mg daily. Pt only has 1 mg tablets at home. She will need the Envarsus 0.75 mg tablets ordered for her. Pt confirmed that she is receiving the Envarsus from [facility] in [place], [place]. TNC explained that when the Envarsus 0.75 mg tablets are delivered to her home, she will take 2 of the 0.75 mg tablets along with one of the 1 mg tablets. Pt verbalized her understanding. Pt was informed that it was fine to get her labs done in two weeks when she has her appointment at [facility] clinic on 06/22/22.    Prescriptions for the Envarsus were sent to [facility] in [place], [place].     An update was sent to Leota Sauers and Milinda Cave, Liver Transplant Nurse Coordinators.

## 2022-06-09 NOTE — Unmapped (Signed)
Called patient about her 06/06/22 tac level since it was slightly lower than goal at 2.3. Confirmed she did not miss any doses and takes her envarsus at 9am each morning including the morning prior to her 8/8 labs. She also confirmed she continues with her mycophenolate that she restarted on August 5 and pred 5mg .   Has kidney txp appt in a couple of weeks and asked if she would be doing labs with that appt and she mentioned she always has labs with her kidney appt.   Mentioned we will route this to her liver and kidney coordinators to be aware of this information and that patient plans to get labs in a couple of weeks.

## 2022-06-13 DIAGNOSIS — Z944 Liver transplant status: Principal | ICD-10-CM

## 2022-06-13 NOTE — Unmapped (Signed)
Standing post liver txp lab order.

## 2022-06-15 NOTE — Unmapped (Signed)
Encounter opened disregard

## 2022-06-19 DIAGNOSIS — Z944 Liver transplant status: Principal | ICD-10-CM

## 2022-06-19 DIAGNOSIS — Z940 Kidney transplant status: Principal | ICD-10-CM

## 2022-06-19 DIAGNOSIS — D849 Immunodeficiency, unspecified: Principal | ICD-10-CM

## 2022-06-19 DIAGNOSIS — N022 Recurrent and persistent hematuria with diffuse membranous glomerulonephritis: Principal | ICD-10-CM

## 2022-06-19 DIAGNOSIS — Z79899 Other long term (current) drug therapy: Principal | ICD-10-CM

## 2022-06-19 DIAGNOSIS — Z48298 Encounter for aftercare following other organ transplant: Principal | ICD-10-CM

## 2022-06-21 DIAGNOSIS — Z796 Long-term use of immunosuppressant medication: Principal | ICD-10-CM

## 2022-06-21 DIAGNOSIS — Z944 Liver transplant status: Principal | ICD-10-CM

## 2022-06-21 DIAGNOSIS — Z940 Kidney transplant status: Principal | ICD-10-CM

## 2022-06-21 MED ORDER — MYCOPHENOLATE SODIUM 180 MG TABLET,DELAYED RELEASE
ORAL_TABLET | Freq: Two times a day (BID) | ORAL | 11 refills | 30 days | Status: CP
Start: 2022-06-21 — End: 2023-06-21

## 2022-06-21 NOTE — Unmapped (Unsigned)
[facility] CLINIC PHARMACY NOTE  [name]  [id number]    Medication changes today:   1. Resume Myfortic 360 mg BID  2. Stop prednisone    Education/Adherence tools provided today:  - Provided updated medication list  - Provided additional education on immunosuppression and transplant related medications including reviewing indications of medications, dosing and side effects    Follow up items:  1. Goal of understanding indications and dosing of immunosuppression medications  2. Envarsus trough, Send Rx to [facility]    Next visit with pharmacy in PRN  ____________________________________________________________________    Michelle Travis is a 66 y.o. 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     PLA2R positive membranous nephropathy recurrence  Ritux (02/2021, 03/2021, 01/2022, 02/2022  ___________________________________________________________________    Interval History: Recent discharge from hospital admission (7/27 - 05/27/22) for diarrhea and AKI.  Rotavirus enteritis; Myfortic held in setting of infection and Pred 5 mg started  Pre-renal AKI (Scr 2.45)    Seen by pharmacy today for: medication management    CC:  Patient has no complaints today     There were no vitals filed for this visit.  ___________________________________________________________________    Allergies   Allergen Reactions    Latex Swelling and Rash     Discoloration of skin.   Discoloration of skin.     Hydrocodone Itching    Hydrocodone-Acetaminophen Itching       Medications reviewed in EPIC medication station and updated today by the clinical pharmacist practitioner.    Current Outpatient Medications   Medication Instructions    acetaminophen (TYLENOL 8 HOUR) 650 mg, Oral, Every 4 hours PRN    aspirin 81 mg, Oral, Daily (standard)    atorvastatin (LIPITOR) 10 mg, Oral, Daily (standard)    BELBUCA 300 mcg buccal film 1 each, Buccal, mg, Oral, Every 4 hours PRN    aspirin 81 mg, Oral, Daily (standard)    atorvastatin (LIPITOR) 10 mg, Oral, Daily (standard)    BELBUCA 300 mcg buccal film 1 each, Buccal, At bedtime    cholecalciferol (vitamin D3 25 mcg (1,000 units)) 1,000 Units, Oral, Daily    ENVARSUS XR 1 mg, Oral, Daily, Take with TWO 0.75 mg tablets (1.5 mg total) by mouth in the morning for a total of 2.5 mg. MAP Renewal    ENVARSUS XR 1.5 mg, Oral, Daily, Take with ONE 1 mg tablet by mouth in the morning for a total of 2.5 mg    loperamide (IMODIUM) 2 mg, Oral, 4 times a day PRN    mycophenolate (MYFORTIC) 180 mg, Oral, 2 times a day, Until you follow-up with your transplant nephrologist.    ondansetron (ZOFRAN-ODT) 4 mg, Oral, Every 8 hours PRN    oxyCODONE (ROXICODONE) 10 mg immediate release tablet TAKE 1 (ONE) TABLET BY MOUTH FOUR TIMES DAILY, AS NEEDED    pantoprazole (PROTONIX) 40 mg, Oral, Daily (standard)    predniSONE (DELTASONE) 5 mg, Oral, Daily       GRAFT FUNCTION: improving since admission  Lab Results   Component Value Date    Creatinine 0.53 (L) 06/06/2022    Creatinine 0.68 05/27/2022    Creatinine 1.16 (H) 05/26/2022    Creatinine 2.45 (H) 05/25/2022    Creatinine 2.74 (H) 05/25/2022    Creatinine 0.47 (L) 01/17/2022    Creatinine 0.54 (L) 11/15/2021    Creatinine 0.71 09/20/2021     Baseline Scr:  0.5 - 0.6  Estimated Creatinine Clearance: 103.5 mL/min (A) (based on SCr of 0.53 mg/dL (L)).    Proteinuria/UPC: {no; if yes, list:54032}  Lab Results   Component Value Date    Protein/Creatinine Ratio, Urine 0.221 05/25/2022    Protein/Creatinine Ratio, Urine 6.782 03/24/2022    Protein/Creatinine Ratio, Urine 6.155 03/07/2022    Protein/Creatinine Ratio, Urine 8.660 02/07/2022    Protein/Creatinine Ratio, Urine 6.940 01/27/2022    Protein/Creat Ratio 2,013 (H) 06/06/2022    Protein/Creat Ratio 8,805 (H) 01/17/2022       DSA: {Blank single:19197::positive,ntd}  Lab Results   Component Value Date    DSA Comment  03/24/2022 Comment:      The HLA antigens listed are donor antigens and the corresponding MFI value.  MFI values >= 1000 are considered positive.  A negative result does not exclude the presence of low level DSA.  Blank donor antigen and MFI fields indicate unavailable donor HLA   type or lack of appropriate single antigen bead to determine DSA.  As such, the presence or absence of DSA to the locus cannot be confirmed.     Donor specific antibody (DSA) testing is performed with a  solid phase multiplex single antigen bead array method.  All procedures and reagents have been validated and performance characteristics determined by the [facility].    Certain of these tests have not been cleared/approved by the U.S. Food and Drug Administration (FDA).  The FDA has determined that such approval/clearance is not necessary because this laboratory is certified under the Clinical Laboratory Improvements   Amendments to perform high complexity testing.  This test is used for clinical purposes.  It should not be regarded as investigational or for research.  All HLA typings are performed using molecular methodologies.  HLA-A, B, C, DR, and DQ results are   serological equivalents of the molecular type.           Zero hour biopsy: ***  Biopsies to date: {MMCSTATUS:82057}      CURRENT IMMUNOSUPPRESSION:    Tacrolimus (Envarsus) *** mg daily  Tacrolimus Goal: 3 - 6  Mycophenolate sodium (Myfortic) on HOLD with enterovirus mg BID    Prednisone 5 mg daily (while Myfortic on hold)    IMMUNOSUPPRESSION DRUG LEVELS:  Lab Results   Component Value Date    Tacrolimus, Trough 3.3 (L) 05/27/2022    Tacrolimus, Trough 8.4 05/25/2022    Tacrolimus, Trough 3.0 (L) 04/17/2022    Tacrolimus Lvl 2.3 06/06/2022    Tacrolimus, Timed 4.6 05/25/2022       {Blank single:19197::Prograf level is accurate 12 hour trough,Prograf level is inaccurate, patient took medication,Prograf level is inaccurate,Envarsus level is accurate 24 hour trough,Envarsus level is inaccurate, patient took medication,CSA level is accurate 12 hour trough,CSA level is inaccurate, patient took medication,CSA level is inaccurate,}    Patient {Blank single:19197::complains of diarrhea,complains of tremor,complains of nausea,complains of headache,is leukopenic,is thrombocytopenic,is tolerating immunosuppression well}    WBC/ANC:  {Blank single:19197::wnl,***}  Lab Results   Component Value Date    WBC 4.1 06/06/2022       Plan:  Start Myfortic   Stop Pred. Continue to monitor.      OI Prophylaxis:   CMV Status: D+/ R+, moderate risk .     Lab Results   Component Value Date    CMV Quant <50 (H) 03/18/2018    CMV Quant Negative 11/28/2017       CV Prophylaxis: asa 81 mg   The 10-year ASCVD risk  score (Arnett DK, et al., 2019) is: 4.3%  No results found for: LIPID    Statin therapy: Indicated; currently on atorvastatin 10 mg daily  Plan: Continue to monitor       BP: Goal < 140/90. Clinic vitals reported above  Home BP ranges:   Date AM BP PM BP                                   Current meds include: ***  Plan: {Blank single:19197::within goal,out of goal,increase,decrease} ***. Continue to monitor    Anemia of CKD:  H/H:   Lab Results   Component Value Date    HGB 13.1 06/06/2022     Lab Results   Component Value Date    HCT 40.7 06/06/2022     Iron panel:  Lab Results   Component Value Date    IRON 142 06/12/2017    TIBC 325.6 06/12/2017    FERRITIN 83.5 06/12/2017     Lab Results   Component Value Date    Iron Saturation (%) 44 06/12/2017       Prior ESA use: ***      Plan: {Blank single:19197::stable,within goal,out of goal,increase,decrease,start,stop}. {MWUXLKG:40102} Continue to monitor.     DM:   Lab Results   Component Value Date    A1C 5.5 04/17/2022   . Goal A1c < 7  History of Dm? {blank single:19197::Yes: ***,No: NODAT,No}  Established with endocrinologist/PCP for BG managment? {blank single:19197::Yes: ***,No,Referral in process}  Currently on: ***  Home BS log:   Breakfast Lunch  Dinner  HS   Date AC PC AC PC [facility] PC                                                                  Diet:***  Exercise:***  Hypoglycemia: {Blank single:19197::yes,no}  Plan: {VOZDGUY:40347} {Blank single:19197}      Electrolytes: {Blank single:19197::wnl,K is high, provided,K is low,Mag is high,Mag is low,Phos is high, Phos is low}  Lab Results   Component Value Date    Potassium 4.3 06/06/2022    Potassium, Bld 5.0 (H) 11/11/2016    Sodium 145 (H) 06/06/2022    Sodium Whole Blood 138 11/11/2016    Magnesium 1.5 (L) 06/06/2022    CO2 26 06/06/2022    Phosphorus 2.3 (L) 05/27/2022       Meds currently on: ***  Plan: {QQVZDGL:87564} Continue to monitor     GI/BM: pt reports {Blank single:19197::***,1 BM,1-2BM,diarrhea,constipation}  Meds currently on: docusate PRN (***), Miralax PRN (***)  Plan: {PPIRJJO:84166} Continue to monitor    Pain: pt reports {Blank single:19197::mild,moderate,significant,no} pain  Meds currently on: APAP PRN (using ***), tramadol PRN (using ***), gabapentin per protocol  Plan: {AYTKZSW:10932} Continue to monitor    Bone health:   Vitamin D Level: {Blank single:19197::none available,pending,last level is ***}. Goal > 30.   Lab Results   Component Value Date    Vitamin D Total (25OH) 77.2 03/24/2022       Lab Results   Component Value Date    Calcium 8.6 (L) 06/06/2022    Calcium 8.7 05/27/2022    Calcium 8.1 (L) 05/26/2022    Calcium 8.6 (L) 01/17/2022  Last DEXA results:  {Blank single:19197::none available,***}  Current meds include: ***  Plan: Vitamin D level  {Blank single:19197::within goal,out of goal,pending,needs to be drawn with next lab schedule},{Blank single:19197::increase,decrease,continue supplementation,start supplementation,stop supplementation,recommend supplementation}. Continue to monitor.     Women's/Men's Health:  Michelle Travis is a 66 y.o. {blank single:19197::Female of childbearing age,Female perimenopausal,Female s/p tubal ligation/hysterectomy,female}. Patient reports {blank single:19197::no men's/women's health issues,desire for pregnancy,need for contraception,on contraception,on hormone replacement therapy}  Plan: Continue to monitor    Immunizations:  Influenza [Annual]: {MMCVACCINE:81280}    PCV13: {MMCVACCINE:81280}  PPSV23: {MMCVACCINE:81280}  PCV20: {MMCVACCINE:81280}    Shingrix Zoster [2 doses, 2 - 6 months apart]: {MMCVACCINE:81280}    COVID-19 [3 primary doses, 2 boosters]: {MMCVACCINE:81280}    Immunization status: {immuniz status:315306}.      Other: ***    Pharmacy preference:  ***  Medication Refills:  ***  Medication Access:  ***    Adherence: Patient has {Blank single:19197::poor,average,good,excellent} understanding of medications; {Blank single:19197::was,was not} able to independently identify names/doses of immunosuppressants and OI meds.  Patient  {blank single:19197::does,does not} fill their own pill box on a regular basis at home.  Patient brought medication card:{Blank single:19197::yes,no}  Pill box:{Blank single:19197::was correct,incorrect,did not bring}  Plan: ***; provided {blank single:19197::basic,moderate,extensive} adherence counseling/intervention    Patient was reviewed with {Blank single:19197::Dr. Gerber,Dr.Desai,Dr. Truddie Coco. True,Dr. Jawa,Dr. Detwiler,Dr. Tamera Reason. Kleman,Dr. Mallavarapu,Dr. Kapoor,Dr. Kumar,Dr. Chen} who was agreement with the stated plan:     During this visit, the following was completed:   BG log data assessment  BP log data assessment  Labs ordered and evaluated  {Blank single:19197::simple treatment plan - 1 DS,complex treatment plan >1 DS}   I spent a total of {NUMBERS; 0-45 BY 5:10291} minutes {VISIT ZOXW:96045} with the patient delivering clinical care and providing education/counseling.    All questions/concerns were addressed to the patient's satisfaction.  __________________________________________  Olivia Mackie, CPP, PharmD  Solid Organ Transplant Clinical Pharmacist Practitioner

## 2022-06-22 ENCOUNTER — Institutional Professional Consult (permissible substitution): Admit: 2022-06-22 | Discharge: 2022-06-22 | Payer: MEDICARE

## 2022-06-22 ENCOUNTER — Ambulatory Visit: Admit: 2022-06-22 | Discharge: 2022-06-22 | Payer: MEDICARE

## 2022-06-22 ENCOUNTER — Ambulatory Visit: Admit: 2022-06-22 | Discharge: 2022-06-22 | Payer: MEDICARE | Attending: Nephrology | Primary: Nephrology

## 2022-06-22 DIAGNOSIS — Z48298 Encounter for aftercare following other organ transplant: Principal | ICD-10-CM

## 2022-06-22 DIAGNOSIS — Z79899 Other long term (current) drug therapy: Principal | ICD-10-CM

## 2022-06-22 DIAGNOSIS — Z940 Kidney transplant status: Principal | ICD-10-CM

## 2022-06-22 DIAGNOSIS — Z944 Liver transplant status: Principal | ICD-10-CM

## 2022-06-22 DIAGNOSIS — D849 Immunodeficiency, unspecified: Principal | ICD-10-CM

## 2022-06-22 DIAGNOSIS — Z796 Long-term use of immunosuppressant medication: Principal | ICD-10-CM

## 2022-06-22 DIAGNOSIS — N022 Recurrent and persistent hematuria with diffuse membranous glomerulonephritis: Principal | ICD-10-CM

## 2022-06-22 LAB — CBC W/ AUTO DIFF
BASOPHILS ABSOLUTE COUNT: 0.1 10*9/L (ref 0.0–0.1)
BASOPHILS RELATIVE PERCENT: 1.4 %
EOSINOPHILS ABSOLUTE COUNT: 0.3 10*9/L (ref 0.0–0.5)
EOSINOPHILS RELATIVE PERCENT: 5 %
HEMATOCRIT: 41.2 % (ref 34.0–44.0)
HEMOGLOBIN: 13.3 g/dL (ref 11.3–14.9)
LYMPHOCYTES ABSOLUTE COUNT: 1.6 10*9/L (ref 1.1–3.6)
LYMPHOCYTES RELATIVE PERCENT: 25.6 %
MEAN CORPUSCULAR HEMOGLOBIN CONC: 32.3 g/dL (ref 32.0–36.0)
MEAN CORPUSCULAR HEMOGLOBIN: 26.4 pg (ref 25.9–32.4)
MEAN CORPUSCULAR VOLUME: 81.7 fL (ref 77.6–95.7)
MEAN PLATELET VOLUME: 9.7 fL (ref 6.8–10.7)
MONOCYTES ABSOLUTE COUNT: 0.6 10*9/L (ref 0.3–0.8)
MONOCYTES RELATIVE PERCENT: 10 %
NEUTROPHILS ABSOLUTE COUNT: 3.6 10*9/L (ref 1.8–7.8)
NEUTROPHILS RELATIVE PERCENT: 58 %
NUCLEATED RED BLOOD CELLS: 0 /100{WBCs} (ref <=4)
PLATELET COUNT: 308 10*9/L (ref 150–450)
RED BLOOD CELL COUNT: 5.04 10*12/L (ref 3.95–5.13)
RED CELL DISTRIBUTION WIDTH: 15.1 % (ref 12.2–15.2)
WBC ADJUSTED: 6.2 10*9/L (ref 3.6–11.2)

## 2022-06-22 LAB — MAGNESIUM: MAGNESIUM: 1.6 mg/dL (ref 1.6–2.6)

## 2022-06-22 LAB — BASIC METABOLIC PANEL
ANION GAP: 6 mmol/L (ref 5–14)
BLOOD UREA NITROGEN: 15 mg/dL (ref 9–23)
BUN / CREAT RATIO: 28
CALCIUM: 9.2 mg/dL (ref 8.7–10.4)
CHLORIDE: 107 mmol/L (ref 98–107)
CO2: 30.2 mmol/L (ref 20.0–31.0)
CREATININE: 0.54 mg/dL — ABNORMAL LOW
EGFR CKD-EPI (2021) FEMALE: >90 mL/min/{1.73_m2} (ref >=60)
GLUCOSE RANDOM: 94 mg/dL (ref 70–179)
POTASSIUM: 4 mmol/L (ref 3.4–4.8)
SODIUM: 143 mmol/L (ref 135–145)

## 2022-06-22 LAB — URINALYSIS WITH MICROSCOPY
BILIRUBIN UA: NEGATIVE
GLUCOSE UA: NEGATIVE
KETONES UA: NEGATIVE
LEUKOCYTE ESTERASE UA: NEGATIVE
NITRITE UA: NEGATIVE
PH UA: 6.5 (ref 5.0–9.0)
PROTEIN UA: >300 — AB
RBC UA: <1 /HPF (ref <4)
SPECIFIC GRAVITY UA: 1.025 (ref 1.005–1.030)
SQUAMOUS EPITHELIAL: 3 /HPF (ref 0–5)
UROBILINOGEN UA: 1
WBC UA: 4 /HPF (ref 0–5)

## 2022-06-22 LAB — HEPATIC FUNCTION PANEL
ALBUMIN: 3.6 g/dL (ref 3.4–5.0)
ALKALINE PHOSPHATASE: 56 U/L (ref 46–116)
ALT (SGPT): 12 U/L (ref 10–49)
AST (SGOT): 17 U/L (ref <=34)
BILIRUBIN DIRECT: 0.2 mg/dL (ref 0.00–0.30)
BILIRUBIN TOTAL: 0.7 mg/dL (ref 0.3–1.2)
PROTEIN TOTAL: 6.4 g/dL (ref 5.7–8.2)

## 2022-06-22 LAB — PROTEIN / CREATININE RATIO, URINE
CREATININE, URINE: 178.9 mg/dL
PROTEIN URINE: 346.2 mg/dL
PROTEIN/CREAT RATIO, URINE: 1.935

## 2022-06-22 LAB — PHOSPHORUS: PHOSPHORUS: 3.5 mg/dL (ref 2.4–5.1)

## 2022-06-22 MED ORDER — ENVARSUS XR 1 MG TABLET,EXTENDED RELEASE
ORAL_TABLET | Freq: Every day | ORAL | 11 refills | 30 days | Status: CP
Start: 2022-06-22 — End: 2023-06-22

## 2022-06-22 MED ORDER — ATORVASTATIN 10 MG TABLET
ORAL_TABLET | Freq: Every day | ORAL | 3 refills | 90 days | Status: CP
Start: 2022-06-22 — End: 2023-06-22
  Filled 2022-06-28: qty 90, 90d supply, fill #0

## 2022-06-22 MED ORDER — ASPIRIN 81 MG CHEWABLE TABLET
ORAL_TABLET | Freq: Every day | ORAL | 3 refills | 90 days | Status: CP
Start: 2022-06-22 — End: 2023-06-22
  Filled 2022-06-28: qty 90, 90d supply, fill #0

## 2022-06-22 MED ORDER — PANTOPRAZOLE 40 MG TABLET,DELAYED RELEASE
ORAL_TABLET | Freq: Every day | ORAL | 3 refills | 90 days | Status: CP
Start: 2022-06-22 — End: 2023-06-22
  Filled 2022-06-28: qty 90, 90d supply, fill #0

## 2022-06-22 MED ORDER — MYCOPHENOLATE SODIUM 180 MG TABLET,DELAYED RELEASE
ORAL_TABLET | Freq: Two times a day (BID) | ORAL | 3 refills | 90 days | Status: CP
Start: 2022-06-22 — End: 2023-06-22
  Filled 2022-06-28: qty 360, 90d supply, fill #0

## 2022-06-22 MED ORDER — CHOLECALCIFEROL (VITAMIN D3) 25 MCG (1,000 UNIT) TABLET
ORAL_TABLET | Freq: Every day | ORAL | 3 refills | 90 days | Status: CP
Start: 2022-06-22 — End: 2023-06-22
  Filled 2022-06-28: qty 90, 90d supply, fill #0

## 2022-06-22 NOTE — Unmapped (Signed)
Transplant Coordinator, Clinic Visit   Pt seen today by transplant nephrology for follow up, reviewed medications and symptoms.   Met with Kayliyah today, she is doing well. She does report left knee pain- she needs a knee replacement.  Pt did get labs today- took medications after lab draw  Per Dr. Margaretmary Bayley- Pt needs dose of ritux in September.     Assessment  BP: 138/89 today in clinic   Lightheaded: denies  Headache: denies  Hand tremors: denies  Numbness/tingling: denies  Fevers: denies  Chills/sweats: denies  Shortness of breath: denies  Chest pain or pressure: denies  Palpitations: denies  Abdominal pain: denies  Heart burn: denies  Nausea/vomiting: denies  Diarrhea/constipation: denies  UTI symptoms: denies  Swelling: denies    Good appetite; reports adequate hydration.    Any new medications? denies  Immunosuppressant last taken: yesterday at 0900    I spent a total of 20 minutes with Amilia reviewing medications and symptoms.

## 2022-06-22 NOTE — Unmapped (Signed)
Transplant Nephrology Clinic Visit    Assessment and Plan  Michelle Travis is a 66 y.o. 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 She has recurrent PLA2R positive membranous nephropathy and is seen for follow up of her transplant and associated medical problems. Issues addressed today include:    Status post kidney transplant with membranous nephropathy recurrence.  - Kidney biopsy 03/11/21 with PLA2R positive membranous nephropathy  - Treatment included Rituximab 1 gm on 03/23/21 and 1 gm on 04/07/21 and 125 mg solumedrol x 2 given with the doses of Rituximab.   - Recurrence of nephrotic range proteinuria treated with Rituximab 1 gm 02/07/22 and 03/07/22.   - UPC after biopsy peaked at 5.02 on 03/23/21 then improved to 0.623 on 07/20/21 only to return to nephrotic range recently. UPC today is down to 1.935 from a peak of 8.66 on 02/07/22.     - Serum creatinine has returned to baseline at 0.54 mg/dL after recent AKI due to rotavirus induced pre-renal state (baseline < 0.8 mg/dL)  - Will plan another dose of rituximab in September 2023.    - Patient at increased risk of DVT in view of membranous nephropathy recurrence. Albumin today has normalized at 3.6.  Exam does not suggest DVT. I have educated her about the signs/symptoms of DVT/PE. Will continue aspirin.     S/P Liver transplant with excellent liver function.   - LFTs normal today  - She will continue to follow with the liver transplant team.     Immunosuppression.   - Tacrolimus level is 2.0  (target 3-6).   - Will assure she is taking the recently instructed higher dose of Envarsus 2.5 mg daily   - Increase Myfortic back to 360 mg BID.  - Stop prednisone today.     Rotavirus infection, 04/2022  - Resulted in hospitalization 7/27-23-05/27/22 for treatment of volume depletion and AKI  - Symptoms now resolved  - No further therapy is warranted.     BP Management  - BP 138/89  - Currently on no antihypertensive therapy.  - Will observe BP off prednisone.    S/P right total hip replacement, 11/2021  - She will follow up with ortho clinic.    Bilateral knee pain.   - I have previously suggested planned possible TKR be delayed until the response to membranous recurrence treatment can be fully evaluated. This is predominantly related to concern about post-op DVT.With improvement in proteinuria and serum albumin level she can once again engage her ortho team regarding timing for possible knee surgery.     History of IgG lambda monoclonal gammopathy.   - SPEP with immunofixation 03/19/20 revealed no monoclonal protein. Kidney biopsy revealed no evidence of MGRS.  - Will periodically recheck SPEP with immunofixation    History of sleep apnea.   - She is no longer on CPAP.  - She denies daytime drowsiness.     Immunizations.  -  Prevnar-20 07/20/21.  - Flu vaccine 07/20/21 will be due again this Fall  - Shingrix x 2 completed  - COVID-19  bivalent booster doses completed 09/07/21 and 03/24/22. She is aware of the limited efficacy of the vaccine. She will plan to take the updated COVID booster when available.     Cancer screening.   - Mammography 05/25/21 negative, has remote history of breast cancer.  - PAP smear last year was unremarkable.  - Colonoscopy 08/07/18 with single 6 mm polyp and diverticulosis.     Follow-up.  Patient will return to clinic in 2 months.   Monthly labs including UPC.  Rituximab 1 gm in September 2023    History of Present Illness    Michelle Travis is a 66 y.o. 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  She was found to have post-transplant proteinuria (UPC 2.31 and 5.02) and a kidney biopsy 03/11/21 revealed recurrent membranous nephropathy. She received 2 doses of Rituximab 1 gm on 03/23/21 and 04/07/21 with subsequent improvement in proteinuria to 0.623 on 07/20/21. UPC increased to 6.94 on 01/27/22 and she was treated with 1 gm rituximab again on 02/07/22 and 03/07/22.     She was admitted to [facility] from transplant clinic on 04/2722 with AKI secondary to volume depletion associated AKI in the setting of a rotavirus infection with nausea, vomiting and diarrhea starting 05/20/22. Serum creatinine was 2.45 mg/dL on admission and improved with IV fluids to a discharge creatinine of 0.68 mg/dL on 1/61/09. Myfortic was held temporarily with instructions to resume at a reduced dose of 180 mg bid on 06/03/22. She was temporarily placed on prednisone 5 mg daily and Envarsus XR was continued at a dose of 2 mg daily. After discharge Envarsus was to be increased to 2.5 mg daily on 06/09/22 due to a level of 2.3. It is not clear if this change was made.     All GI symptoms have completely resolved. She denies nausea, vomiting, diarrhea, fever, chills, abdominal pain, dysuria, or allograft tenderness. She denies headaches or tremors. She denies chest pain, shortness of breath, or edema.     She underwent right total hip replacement 12/09/21 and left knee knee surgery has been postponed until membranous can again enter a phase of remission. She continues to have knee pain limiting her mobility.       Transplant History:  1. 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 S/P kidney and liver transplants on 09/13/17 at [facility]. Kidney KDPI 26%. CMV D+/R+; EBV D-/R+, hep C Ab +/NAT- (recipient hep C Ab negative)  4. Complicated by rectus sheath hematoma  5. Delayed graft function requiring hemodialysis sessions following transplant (stopped dialysis 11/26/018).  6. Immunosuppression was ??? induction followed by tacrolimus and myfortic maintenance therapy.   7. Baseline creatinine <1.0 mg/dL.  8. Kidney biopsy 03/11/21 with recurrent PLA2R positive membranous nephropathy, treated with Rituximab 1 gm x 4 (03/23/21, 04/07/21, 02/07/22, 03/07/22)   9. Rotavirus infection 04/2022 with AKI secondary to volume depletion    Past Medical History:  1. Alcoholic cirrhosis, S/P TIPS  2. Membranous nephropathy, PLA2R positive biopsy 08/30/2016, treated solumedrol initially with little improvement then with Rituximab (10/2016 and 03/2017).   3. S/P kidney and liver transplants as stated above  4. History of breast cancer, in remission for 17 years, s/p lumpectomy.  5. History of IGG monoclonal gammopathy, IgG lambda. Kidney biopsy 08/30/2016 without MGRS.  6. S/p cerebral aneurysm repair.  7. S/P total knee replacement, right   8. Essential HTN prior to ESLD  9. History of diverticulitis  10. Recurrent pleural effusion, history of pneumonia x 3  11. Osteopenia by DEXA 06/14/2016  12. Hypoattenuating thyroid nodule noted on chest CT 11/11/2016  13. Sleep apnea    Review of Systems    All other systems are reviewed and are negative.    Medications    Current Outpatient  Medications   Medication Sig Dispense Refill   ??? acetaminophen (TYLENOL 8 HOUR) 650 MG CR tablet Take 1 tablet (650 mg total) by mouth every four (4) hours as needed for pain.     ??? aspirin 81 MG chewable tablet Chew 1 tablet (81 mg total) daily.     ??? atorvastatin (LIPITOR) 10 MG tablet Take 1 tablet (10 mg total) by mouth daily. 30 tablet 8   ??? BELBUCA 300 mcg buccal film Apply 1 Film (300 mcg total) to cheek at bedtime.     ??? cholecalciferol, vitamin D3, 1,000 unit (25 mcg) tablet Take 1 tablet (25 mcg total) by mouth in the morning.     ??? ENVARSUS XR 0.75 mg Tb24 extended release tablet Take 2 tablets (1.5 mg total) by mouth in the morning. Take with ONE 1 mg tablet by mouth in the morning for a total of 2.5 mg. 60 tablet 11   ??? ENVARSUS XR 1 mg Tb24 extended release tablet Take 1 tablet (1 mg total) by mouth in the morning. Take with TWO 0.75 mg tablets (1.5 mg total) by mouth in the morning for a total of 2.5 mg. MAP Renewal. 30 tablet 11   ??? loperamide (IMODIUM) 2 mg capsule Take 1 capsule (2 mg total) by mouth four (4) times a day as needed. ??? mycophenolate (MYFORTIC) 180 MG EC tablet Take 1 tablet (180 mg total) by mouth two (2) times a day. Until you follow-up with your transplant nephrologist. 120 tablet 11   ??? mycophenolate (MYFORTIC) 180 MG EC tablet Take 2 tablets (360 mg total) by mouth two (2) times a day. 120 tablet 11   ??? ondansetron (ZOFRAN-ODT) 4 MG disintegrating tablet Take 1 tablet (4 mg total) by mouth every eight (8) hours as needed for nausea.     ??? oxyCODONE (ROXICODONE) 10 mg immediate release tablet TAKE 1 (ONE) TABLET BY MOUTH FOUR TIMES DAILY, AS NEEDED     ??? pantoprazole (PROTONIX) 40 MG tablet Take 1 tablet (40 mg total) by mouth daily. 30 tablet 0   ??? predniSONE (DELTASONE) 5 MG tablet Take 1 tablet (5 mg total) by mouth in the morning. 30 tablet 0     No current facility-administered medications for this visit.       Physical Exam    BP 138/89 (BP Site: R Arm, BP Position: Sitting, BP Cuff Size: Medium)  - Pulse 85  - Temp 37 ??C (98.6 ??F) (Temporal)  - Ht 160 cm (5' 3)  - Wt 78.4 kg (172 lb 12.8 oz)  - LMP  (LMP Unknown)  - BMI 30.61 kg/m??   General: Patient is a pleasant female in no apparent distress.  Eyes: Sclera anicteric.  Neck: Supple without LAD/JVD/bruits.  Lungs: Clear to auscultation bilaterally, no wheezes/rales/rhonchi.  Cardiovascular: Regular rate and rhythm without murmurs, rubs or gallops.  Abdomen: Soft, notender/nondistended. Positive bowel sounds. No hepatosplenomegaly, masses or bruits appreciated.   Extremities: no edema  Skin: Without rash  Neurological: Grossly nonfocal.  Psychiatric: Mood and affect appropriate.    Laboratory Results and Imaging Reviewed

## 2022-06-22 NOTE — Unmapped (Signed)
[facility] Refill Coordination Note    Specialty Medication(s) to be Shipped:   Transplant: Envarsus 1mg  and Myfortic 180mg     Other medication(s) to be shipped: aspirin, atorvastatin, vitamin d3, pantoprazole     AVO VOCI, DOB: 02/12/1956  Phone: [phone] (home)       All above HIPAA information was verified with patient.     Was a Nurse, learning disability used for this call? No    Completed refill call assessment today to schedule patient's medication shipment from the [facility] [phone]).  All relevant notes have been reviewed.     Specialty medication(s) and dose(s) confirmed: Regimen is correct and unchanged.   Changes to medications: Madhumitha reports no changes at this time.  Changes to insurance: No  New side effects reported not previously addressed with a pharmacist or physician: None reported  Questions for the pharmacist: No    Confirmed patient received a Conservation officer, historic buildings and a Surveyor, mining with first shipment. The patient will receive a drug information handout for each medication shipped and additional FDA Medication Guides as required.       DISEASE/MEDICATION-SPECIFIC INFORMATION        N/A    SPECIALTY MEDICATION ADHERENCE     Medication Adherence    Patient reported X missed doses in the last month: 0  Specialty Medication: mycophenolate 180 mg  Patient is on additional specialty medications: Yes  Additional Specialty Medications: Envarsus  Patient Reported Additional Medication X Missed Doses in the Last Month: 0  Patient is on more than two specialty medications: No  Any gaps in refill history greater than 2 weeks in the last 3 months: no  Demonstrates understanding of importance of adherence: yes  Informant: patient      Adherence tools used: patient uses a pill box to manage medications                      Were doses missed due to medication being on hold? No    Envarsus 1mg : Patient has 7 days of medication on hand  Myfortic 180mg : Patient has 7 days of medication on hand    REFERRAL TO PHARMACIST     Referral to the pharmacist: Not needed      [facility]     Shipping address confirmed in Epic.     Delivery Scheduled: Yes, Expected medication delivery date: 8/30.     Medication will be delivered via UPS to the prescription address in Epic WAM.    Olga Millers   [facility] Specialty Technician

## 2022-06-23 DIAGNOSIS — Z944 Liver transplant status: Principal | ICD-10-CM

## 2022-06-23 DIAGNOSIS — Z796 Long-term use of immunosuppressant medication: Principal | ICD-10-CM

## 2022-06-23 LAB — TACROLIMUS LEVEL: TACROLIMUS BLOOD: 2 ng/mL

## 2022-06-23 MED ORDER — PREDNISONE 5 MG TABLET
ORAL_TABLET | Freq: Every day | ORAL | 0 refills | 30 days
Start: 2022-06-23 — End: 2022-07-23

## 2022-06-23 MED ORDER — ENVARSUS XR 1 MG TABLET,EXTENDED RELEASE
ORAL_TABLET | Freq: Every day | ORAL | 3 refills | 90 days | Status: CP
Start: 2022-06-23 — End: 2023-06-23

## 2022-06-24 LAB — CMV DNA, QUANTITATIVE, PCR: CMV VIRAL LD: NOT DETECTED

## 2022-06-26 DIAGNOSIS — Z796 Long-term use of immunosuppressant medication: Principal | ICD-10-CM

## 2022-06-26 DIAGNOSIS — Z940 Kidney transplant status: Principal | ICD-10-CM

## 2022-06-26 DIAGNOSIS — Z944 Liver transplant status: Principal | ICD-10-CM

## 2022-06-26 DIAGNOSIS — Z79899 Other long term (current) drug therapy: Principal | ICD-10-CM

## 2022-06-26 MED ORDER — TACROLIMUS XR 0.75 MG TABLET,EXTENDED RELEASE 24 HR
ORAL_TABLET | Freq: Every day | ORAL | 3 refills | 90 days | Status: CP
Start: 2022-06-26 — End: 2023-06-21

## 2022-06-26 MED ORDER — ENVARSUS XR 1 MG TABLET,EXTENDED RELEASE
ORAL_TABLET | Freq: Every day | ORAL | 3 refills | 90 days | Status: CP
Start: 2022-06-26 — End: 2023-06-26

## 2022-06-26 NOTE — Unmapped (Signed)
Message sent to [facility] front desk schedulers to schedule appt for Rituximab infusion

## 2022-06-26 NOTE — Unmapped (Signed)
Reviewed tacrolimus level of 2.0 from 8/24. Pt reported that she took 2 mg of the Envarsus at 9:00 AM the day before blood work was done. Pt stated that she had never received the 0.75 mg Envarsus tablets. TNC stated that she will speak with the physician to see if she should increase to the 2.5 mg.    Message sent to Dr. Margaretmary Bayley and [facility] regarding the tacrolimus level of 2.0 and that the pt had not received the 0.75 mg of Envarsus to take with her 1.0 mg tablets She stated that the pt should increase to the 2.5 mg daily.    Call placed to [facility] and TNC spoke with Jesusita Oka. He confirmed that the prescription had been received but was not sent out due to clarification needed regarding the supply ordered Pt requires a 90 day supply since she is on the patient assistance program. Verbal order provided to Dan to send out the 0.75 mg of Envarsus.    Prescriptions updated on the medication list and sent to [facility].     Spoke to the pt and provided an update that the Envarsus will be increased to 2.5 mg daily. TNC stated that she had spoken with [facility] and they will now release the 0.75 mg tablets. Once pt receives the 0.75 mg tablets, TNC explained that she will take 2 of the 0.75 mg tablets along with one of the 1 mg tablets. Pt verbalized her understanding but stated that she will call the TNC once she received the medication to review how to take it. TNC will also send a MyChart message for the pt to review for instructions.     MyChart message was sent to the pt with instructions on how to take the Envarsus and to get her labs repeated in 10-14 days after starting the new dose of the Envarsus.     Lab orders for [facility] placed.

## 2022-06-27 NOTE — Unmapped (Signed)
8/29: patient still gets envarsus from [facility], does not need from [facility] at this time -ef    The following medication is onboarded in this note:  1. Envarsus $0/30ds not part b    [facility]   Patient Onboarding/Medication Counseling    Michelle Travis is a 66 y.o. female with liver kidney transplant who I am counseling today on continuation of therapy.  I am speaking to the patient.    Was a Nurse, learning disability used for this call? No    Verified patient's date of birth / HIPAA.    Specialty medication(s) to be sent: na      Non-specialty medications/supplies to be sent: na      Medications not needed at this time: na         The patient declined counseling on medication administration, missed dose instructions, goals of therapy, side effects and monitoring parameters, warnings and precautions, drug/food interactions and storage, handling precautions, and disposal because they have taken the medication previously. The information in the declined sections below are for informational purposes only and was not discussed with patient.   Envarsus (tacrolimus XR)    Medication & Administration     Dosage: take 2 tablets (2mg  total) by mouth in the morning       Administration:   ??? Take in the morning on empty stomach - 1 hour before or 2 hours after breakfast  ??? Take with drink of water  ??? Do not crush, break, or chew tablets    Adherence/Missed dose instructions:  ??? Take a missed dose as soon as you think about it.  ??? If it has been more than 15 hours since missed dose, skip the missed dose and go back to your regular time  ??? Report all missed doses to transplant coordinator    Goals of Therapy     ??? To prevent organ rejection    Side Effects & Monitoring Parameters     ??? Common side effects  ??? Fatigue  ??? Headache  ??? Stuffy nose or sore throat  ??? Nausea, vomiting, stomach pain, diarrhea, constipation  ??? Heartburn  ??? Back or joint pain  ??? Increased risk of infection    ??? The following side effects should be reported to the provider:  ??? Allergic reaction  ??? Kidney issues (change in quantity or urine passed, blood in urine, or weight gain)  ??? High blood pressure (dizziness, change in eyesight, headache)  ??? Electrolyte issues (change in mood, confusion, muscle pain, or weakness)  ??? Abnormal breathing  ??? Shakiness  ??? Unexplained bleeding or bruising (gums bleeding, blood in urine, nosebleeds, any abnormal bleeding)  ??? Signs of infection    ??? Monitoring Parameters  ??? Renal function  ??? Liver function  ??? Glucose levels  ??? Blood pressure  ??? Tacrolimus trough levels      Contraindications, Warnings, & Precautions     ??? Black Box Warning: Infections - immunosuppressant agents increase the risk of infection that may lead to hospitalization or death  ??? Black Box Warning: Malignancy - immunosuppressant agents may be associated with the development of malignancies that may lead to hospitalization or death.  Limit or avoid sun and ultraviolet light exposure, use appropriate sun protection  ??? Myocardial hypertrophy -avoid use in patients with congenital long QT syndrome  ??? Diabetes mellitus - the risk for new-onset diabetes and insulin-dependent post-transplant diabetes mellitus is increased with tacrolimus use after transplantation  ??? GI perforation  ??? Hyperkalemia  ???  Hypertension  ??? Nephrotoxicity  ??? Neurotoxicity    Drug/Food Interactions     ??? Medication list reviewed in Epic. no interactions noted that clinic is not already monitoring.   ??? Due to amount and severity of drug interactions, report ALL medications starts, discontinuations, and changes to transplant coordinator prior to making the change  ??? Avoid alcohol  ??? Avoid grapefruit or grapefruit juice  ??? Avoid live vaccines    Storage, Handling Precautions, & Disposal     ??? Store at room temperature  ??? Keep away from children and pets      Current Medications (including OTC/herbals), Comorbidities and Allergies     Current Outpatient Medications   Medication Sig Dispense Refill   ??? acetaminophen (TYLENOL 8 HOUR) 650 MG CR tablet Take 1 tablet (650 mg total) by mouth every four (4) hours as needed for pain. 30 tablet 0   ??? aspirin 81 MG chewable tablet Chew 1 tablet (81 mg total) daily. 90 tablet 3   ??? atorvastatin (LIPITOR) 10 MG tablet Take 1 tablet (10 mg total) by mouth daily. 90 tablet 3   ??? BELBUCA 300 mcg buccal film Apply 1 Film (300 mcg total) to cheek at bedtime.     ??? cholecalciferol, vitamin D3 25 mcg, 1,000 units,, 1,000 unit (25 mcg) tablet Take 1 tablet (25 mcg total) by mouth in the morning. 90 tablet 3   ??? ENVARSUS XR 1 mg Tb24 extended release tablet Take 1 tablet (1 mg total) by mouth in the morning. Take with TWO 0.75 mg tablets (1.5 mg total) by mouth in the morning for a total of 2.5 mg.. 90 tablet 3   ??? mycophenolate (MYFORTIC) 180 MG EC tablet Take 2 tablets (360 mg total) by mouth two (2) times a day. 360 tablet 3   ??? oxyCODONE (ROXICODONE) 10 mg immediate release tablet TAKE 1 (ONE) TABLET BY MOUTH FOUR TIMES DAILY, AS NEEDED     ??? pantoprazole (PROTONIX) 40 MG tablet Take 1 tablet (40 mg total) by mouth daily. 90 tablet 3   ??? tacrolimus (ENVARSUS XR) 0.75 mg Tb24 extended release tablet Take 2 tablets (1.5 mg total) by mouth in the morning. Take with ONE 1 mg tablet by mouth in the morning for a total of 2.5 mg.. 180 tablet 3     No current facility-administered medications for this visit.       Allergies   Allergen Reactions   ??? Latex Swelling and Rash     Discoloration of skin.   Discoloration of skin.    ??? Hydrocodone Itching   ??? Hydrocodone-Acetaminophen Itching       Patient Active Problem List   Diagnosis   ??? Hypertension (RAF-HCC)   ??? Chronic back pain   ??? History of breast cancer 2000 s/p lumpectomy   ??? Sleep apnea   ??? Membranous Nephropathy with segmental scarring   ??? Rectus sheath hematoma   ??? Liver replaced by transplant (CMS-HCC)   ??? Kidney transplanted   ??? Aftercare following organ transplant   ??? Immunosuppressed status (CMS-HCC)   ??? 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 History of colonic polyps   ??? 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 Status post total hip replacement, right   ??? Unilateral primary osteoarthritis, left knee   ??? Unilateral primary osteoarthritis, right hip   ??? Rotavirus enteritis       Reviewed and up to date in Epic.  Appropriateness of Therapy     Acute infections noted within Epic:  No active infections  Patient reported infection: None    Is medication and dose appropriate based on diagnosis and infection status? Yes    Prescription has been clinically reviewed: Yes      Baseline Quality of Life Assessment      How many days over the past month did your transplant  keep you from your normal activities? For example, brushing your teeth or getting up in the morning. 0    Financial Information     Medication Assistance provided: None Required    Anticipated copay of $0/30ds not part b reviewed with patient. Verified delivery address.    Delivery Information     Scheduled delivery date: na - pt is getting from [facility] at this time    Expected start date: pt is currently already taking    Medication will be delivered via na to the na address in [place].  This shipment will not require a signature.      Explained the services we provide at [facility] and that each month we would call to set up refills.  Stressed importance of returning phone calls so that we could ensure they receive their medications in time each month.  Informed patient that we should be setting up refills 7-10 days prior to when they will run out of medication.  A pharmacist will reach out to perform a clinical assessment periodically.  Informed patient that a welcome packet, containing information about our pharmacy and other support services, a Notice of Privacy Practices, and a drug information handout will be sent.      The patient or caregiver noted above participated in the development of this care plan and knows that they can request review of or adjustments to the care plan at any time.      Patient or caregiver verbalized understanding of the above information as well as how to contact the pharmacy at [phone] option 4 with any questions/concerns.  The pharmacy is open Monday through Friday 8:30am-4:30pm.  A pharmacist is available 24/7 via pager to answer any clinical questions they may have.    Patient Specific Needs     - Does the patient have any physical, cognitive, or cultural barriers? No    - Does the patient have adequate living arrangements? (i.e. the ability to store and take their medication appropriately) Yes    - Did you identify any home environmental safety or security hazards? No    - Patient prefers to have medications discussed with  Patient     - Is the patient or caregiver able to read and understand education materials at a high school level or above? Yes    - Patient's primary language is  English     - Is the patient high risk? Yes, patient is taking a REMS drug. Medication is dispensed in compliance with REMS program    SOCIAL DETERMINANTS OF HEALTH     At the [facility], we have learned that life circumstances - like trouble affording food, housing, utilities, or transportation can affect the health of many of our patients.   That is why we wanted to ask: are you currently experiencing any life circumstances that are negatively impacting your health and/or quality of life? No    Social Determinants of Health     Financial Resource Strain: Low Risk  (05/26/2022)    Overall Financial Resource Strain (CARDIA)    ???  Difficulty of Paying Living Expenses: Not very hard   Internet Connectivity: Not on file   Food Insecurity: No Food Insecurity (05/26/2022)    Hunger Vital Sign    ??? Worried About Running Out of Food in the Last Year: Never true    ??? Ran Out of Food in the Last Year: Never true   Tobacco Use: Low Risk  (06/22/2022)    Patient History    ??? Smoking Tobacco Use: Never ??? Smokeless Tobacco Use: Never    ??? Passive Exposure: Never   Housing/Utilities: Low Risk  (05/26/2022)    Housing/Utilities    ??? Within the past 12 months, have you ever stayed: outside, in a car, in a tent, in an overnight shelter, or temporarily in someone else's home (i.e. couch-surfing)?: No    ??? Are you worried about losing your housing?: No    ??? Within the past 12 months, have you been unable to get utilities (heat, electricity) when it was really needed?: No   Alcohol Use: Not on file   Transportation Needs: No Transportation Needs (05/26/2022)    PRAPARE - Transportation    ??? Lack of Transportation (Medical): No    ??? Lack of Transportation (Non-Medical): No   Substance Use: Not on file   Health Literacy: Not on file   Physical Activity: Not on file   Interpersonal Safety: Not on file   Stress: Not on file   Intimate Partner Violence: Not on file   Depression: Not at risk (02/24/2021)    PHQ-2    ??? PHQ-2 Score: 0   Social Connections: Not on file       Would you be willing to receive help with any of the needs that you have identified today? Not applicable       [name]  [facility] Specialty Pharmacist

## 2022-06-28 DIAGNOSIS — Z79899 Other long term (current) drug therapy: Principal | ICD-10-CM

## 2022-06-28 DIAGNOSIS — Z940 Kidney transplant status: Principal | ICD-10-CM

## 2022-06-28 NOTE — Unmapped (Signed)
Received a MyChart message from  the pt stating that she had received the 0.75 mg of Envarsus. She requested a call to discuss  how to take the medication.    TNC called the pt and reviewed instructions for taking the Envarsus 2.5 mg daily. Advised to take 1 of the 1 mg tablets along with 2 of the 0.75 mg tablets. Pt verbalized her understanding and will get labs repeated at [facility] in 10 days.    Orders sent to [facility].

## 2022-07-02 DIAGNOSIS — N042 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: Principal | ICD-10-CM

## 2022-07-02 DIAGNOSIS — Z940 Kidney transplant status: Principal | ICD-10-CM

## 2022-07-02 DIAGNOSIS — N022 Recurrent and persistent hematuria with diffuse membranous glomerulonephritis: Principal | ICD-10-CM

## 2022-07-02 DIAGNOSIS — Z48298 Encounter for aftercare following other organ transplant: Principal | ICD-10-CM

## 2022-07-02 DIAGNOSIS — D849 Immunodeficiency, unspecified: Principal | ICD-10-CM

## 2022-07-03 DIAGNOSIS — Z940 Kidney transplant status: Principal | ICD-10-CM

## 2022-07-03 DIAGNOSIS — Z79899 Other long term (current) drug therapy: Principal | ICD-10-CM

## 2022-07-03 DIAGNOSIS — Z1159 Encounter for screening for other viral diseases: Principal | ICD-10-CM

## 2022-07-10 LAB — CBC W/ DIFFERENTIAL
BANDED NEUTROPHILS ABSOLUTE COUNT: 0 10*3/uL (ref 0.0–0.1)
BASOPHILS ABSOLUTE COUNT: 0.1 10*3/uL (ref 0.0–0.2)
BASOPHILS RELATIVE PERCENT: 1 %
EOSINOPHILS ABSOLUTE COUNT: 0.2 10*3/uL (ref 0.0–0.4)
EOSINOPHILS RELATIVE PERCENT: 4 %
HEMATOCRIT: 43.1 % (ref 34.0–46.6)
HEMOGLOBIN: 13.8 g/dL (ref 11.1–15.9)
IMMATURE GRANULOCYTES: 0 %
LYMPHOCYTES ABSOLUTE COUNT: 1.4 10*3/uL (ref 0.7–3.1)
LYMPHOCYTES RELATIVE PERCENT: 26 %
MEAN CORPUSCULAR HEMOGLOBIN CONC: 32 g/dL (ref 31.5–35.7)
MEAN CORPUSCULAR HEMOGLOBIN: 26.7 pg (ref 26.6–33.0)
MEAN CORPUSCULAR VOLUME: 84 fL (ref 79–97)
MONOCYTES ABSOLUTE COUNT: 0.6 10*3/uL (ref 0.1–0.9)
MONOCYTES RELATIVE PERCENT: 10 %
NEUTROPHILS ABSOLUTE COUNT: 3.2 10*3/uL (ref 1.4–7.0)
NEUTROPHILS RELATIVE PERCENT: 59 %
PLATELET COUNT: 299 10*3/uL (ref 150–450)
RED BLOOD CELL COUNT: 5.16 x10E6/uL (ref 3.77–5.28)
RED CELL DISTRIBUTION WIDTH: 14.1 % (ref 11.7–15.4)
WHITE BLOOD CELL COUNT: 5.4 10*3/uL (ref 3.4–10.8)

## 2022-07-10 LAB — BASIC METABOLIC PANEL
BLOOD UREA NITROGEN: 14 mg/dL (ref 8–27)
BUN / CREAT RATIO: 28 (ref 12–28)
CALCIUM: 9.1 mg/dL (ref 8.7–10.3)
CHLORIDE: 105 mmol/L (ref 96–106)
CO2: 23 mmol/L (ref 20–29)
CREATININE: 0.5 mg/dL — ABNORMAL LOW (ref 0.57–1.00)
EGFR: 103 mL/min/{1.73_m2}
GLUCOSE: 98 mg/dL (ref 70–99)
POTASSIUM: 4.3 mmol/L (ref 3.5–5.2)
SODIUM: 145 mmol/L — ABNORMAL HIGH (ref 134–144)

## 2022-07-11 LAB — URINALYSIS WITH MICROSCOPY
BILIRUBIN UA: NEGATIVE
GLUCOSE UA: NEGATIVE
KETONES UA: NEGATIVE
LEUKOCYTE ESTERASE UA: NEGATIVE
NITRITE UA: NEGATIVE
PH UA: 6.5 (ref 5.0–7.5)
SPECIFIC GRAVITY UA: 1.02 (ref 1.005–1.030)
UROBILINOGEN UA: 0.2 mg/dL (ref 0.2–1.0)

## 2022-07-11 LAB — PROTEIN / CREATININE RATIO, URINE
CREATININE URINE: 79.4 mg/dL
PROTEIN URINE: 390 mg/dL
PROTEIN/CREAT RATIO: 4912 mg/g{creat} — ABNORMAL HIGH (ref 0–200)

## 2022-07-11 LAB — MICROSCOPIC EXAMINATION
BACTERIA: NONE SEEN
CASTS: NONE SEEN /LPF

## 2022-07-11 LAB — MAGNESIUM: MAGNESIUM: 1.6 mg/dL (ref 1.6–2.3)

## 2022-07-11 LAB — PHOSPHORUS: PHOSPHORUS, SERUM: 3 mg/dL (ref 3.0–4.3)

## 2022-07-12 LAB — TACROLIMUS LEVEL: TACROLIMUS BLOOD: 3.5 ng/mL (ref 2.0–20.0)

## 2022-07-13 ENCOUNTER — Encounter: Payer: Self-pay | Admitting: Orthopaedic Surgery

## 2022-07-13 ENCOUNTER — Ambulatory Visit (INDEPENDENT_AMBULATORY_CARE_PROVIDER_SITE_OTHER): Payer: Medicare HMO | Admitting: Orthopaedic Surgery

## 2022-07-13 DIAGNOSIS — M1712 Unilateral primary osteoarthritis, left knee: Secondary | ICD-10-CM | POA: Diagnosis not present

## 2022-07-13 DIAGNOSIS — M25552 Pain in left hip: Secondary | ICD-10-CM | POA: Diagnosis not present

## 2022-07-13 DIAGNOSIS — M25562 Pain in left knee: Secondary | ICD-10-CM | POA: Diagnosis not present

## 2022-07-13 DIAGNOSIS — M545 Low back pain, unspecified: Secondary | ICD-10-CM

## 2022-07-13 DIAGNOSIS — G8929 Other chronic pain: Secondary | ICD-10-CM

## 2022-07-13 NOTE — Progress Notes (Signed)
The patient is well-known to me.  We replaced her right hip back in February of this year.  She is only 57 and has chronic kidney issues.  She has a remote history of a right knee replacement done many years ago.  Her low back has been bothering her to the left side as well as her left hip and her left knee.  We x-rayed her June of this year showing her left hip and we x-rayed her left knee.  I went over those x-rays again today.  Her left knee has severe end-stage arthritis with bone-on-bone wear and osteophytes in all 3 compartments.  There is complete loss of the joint space.  She does wear a knee brace on that knee.  Her left hip showed mild to moderate joint space narrowing.  Previous imaging studies of her lumbar spine shows significant stenosis at multiple levels and significant facet arthritis at L4-L5 and L5-S1.  This was seen on CT imaging studies.  She cannot have an MRI.  She has a remote history of injections in her back.  On my exam today most of her pain seems to be with her left knee and her left lower back.  This does not seem to be radicular though.  I did review all the imaging studies especially of her lumbar spine.  She is interested in knee replacement but likely after the first of the year when she is cleared again from a kidney standpoint.  I will see her back in 4 months to see how she is doing overall.  In the interim, I would like to have Dr. Ernestina Patches consider facet joint injections to the left side where she has symptomatic at L4-L5 and possibly L5-S1.  We will see if we get that scheduled with Dr. Ernestina Patches.  She agrees with this treatment plan.

## 2022-07-14 ENCOUNTER — Other Ambulatory Visit: Payer: Self-pay

## 2022-07-14 DIAGNOSIS — M545 Low back pain, unspecified: Secondary | ICD-10-CM

## 2022-07-19 DIAGNOSIS — N042 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: Principal | ICD-10-CM

## 2022-07-19 DIAGNOSIS — N022 Recurrent and persistent hematuria with diffuse membranous glomerulonephritis: Principal | ICD-10-CM

## 2022-07-20 ENCOUNTER — Ambulatory Visit: Admit: 2022-07-20 | Discharge: 2022-07-21 | Payer: MEDICARE

## 2022-07-20 LAB — CBC W/ AUTO DIFF
BASOPHILS ABSOLUTE COUNT: 0.1 10*9/L (ref 0.0–0.1)
BASOPHILS RELATIVE PERCENT: 1.5 %
EOSINOPHILS ABSOLUTE COUNT: 0.2 10*9/L (ref 0.0–0.5)
EOSINOPHILS RELATIVE PERCENT: 6.5 %
HEMATOCRIT: 39.9 % (ref 34.0–44.0)
HEMOGLOBIN: 13 g/dL (ref 11.3–14.9)
LYMPHOCYTES ABSOLUTE COUNT: 1.2 10*9/L (ref 1.1–3.6)
LYMPHOCYTES RELATIVE PERCENT: 34.2 %
MEAN CORPUSCULAR HEMOGLOBIN CONC: 32.5 g/dL (ref 32.0–36.0)
MEAN CORPUSCULAR HEMOGLOBIN: 26.7 pg (ref 25.9–32.4)
MEAN CORPUSCULAR VOLUME: 82.1 fL (ref 77.6–95.7)
MEAN PLATELET VOLUME: 9.5 fL (ref 6.8–10.7)
MONOCYTES ABSOLUTE COUNT: 0.4 10*9/L (ref 0.3–0.8)
MONOCYTES RELATIVE PERCENT: 12.1 %
NEUTROPHILS ABSOLUTE COUNT: 1.7 10*9/L — ABNORMAL LOW (ref 1.8–7.8)
NEUTROPHILS RELATIVE PERCENT: 45.7 %
PLATELET COUNT: 315 10*9/L (ref 150–450)
RED BLOOD CELL COUNT: 4.86 10*12/L (ref 3.95–5.13)
RED CELL DISTRIBUTION WIDTH: 14.9 % (ref 12.2–15.2)
WBC ADJUSTED: 3.7 10*9/L (ref 3.6–11.2)

## 2022-07-20 LAB — PROTEIN / CREATININE RATIO, URINE
CREATININE, URINE: 94.8 mg/dL
PROTEIN URINE: 225.7 mg/dL
PROTEIN/CREAT RATIO, URINE: 2.381

## 2022-07-20 MED ADMIN — acetaminophen (TYLENOL) tablet 650 mg: 650 mg | ORAL | @ 12:00:00 | Stop: 2022-07-20

## 2022-07-20 MED ADMIN — methylPREDNISolone sodium succinate (PF) (SOLU-Medrol) injection 125 mg: 125 mg | INTRAVENOUS | @ 12:00:00 | Stop: 2022-07-20

## 2022-07-20 MED ADMIN — diphenhydrAMINE (BENADRYL) injection 25 mg: 25 mg | INTRAVENOUS | @ 12:00:00 | Stop: 2022-07-20

## 2022-07-20 MED ADMIN — riTUXimab-abbs (TRUXIMA) 1,000 mg in sodium chloride (NS) 0.9 % 640 mL IVPB: 1000 mg | INTRAVENOUS | @ 13:00:00 | Stop: 2022-07-20

## 2022-07-20 MED ADMIN — sodium chloride (NS) 0.9 % infusion: 20 mL/h | INTRAVENOUS | @ 13:00:00 | Stop: 2022-07-20

## 2022-07-20 NOTE — Unmapped (Signed)
0800 Pt arrived Transplant Infusion room for Rituximab infusion. Condition: well; Mobility: ambulating and via wheelchair; accompanied by relative .  0809 VS stable.  0825 PIV placed, labs collected and sent, urine collected and sent.  1610 Premedications given.  Today's lab results: WBC 3.7 (must be >2.0 or hold infusion and notify provider) and Platelets 315 (must be > 50 or hold infusion and notify provider). Hgb 13.  0905 Rituximab infusion initiated rituximab: rituximab subsequent infusion protocol to start 100 mg/hr, rate increase by 100 mg/hr every 30 minutes as tolerated till maximum rate of 400 mg/hr.    1228 Rituximab infusion complete, pt tolerated infusion well.  1228 VS stable , PIV removed, pt left clinic feeling well , accompanied by relative.   See Flowsheet and MAR for all details of visit.

## 2022-07-27 ENCOUNTER — Ambulatory Visit: Payer: Self-pay

## 2022-07-27 ENCOUNTER — Ambulatory Visit (INDEPENDENT_AMBULATORY_CARE_PROVIDER_SITE_OTHER): Payer: Medicare HMO | Admitting: Physical Medicine and Rehabilitation

## 2022-07-27 DIAGNOSIS — M47816 Spondylosis without myelopathy or radiculopathy, lumbar region: Secondary | ICD-10-CM

## 2022-07-27 DIAGNOSIS — M5416 Radiculopathy, lumbar region: Secondary | ICD-10-CM

## 2022-07-27 MED ORDER — METHYLPREDNISOLONE ACETATE 80 MG/ML IJ SUSP
80.0000 mg | Freq: Once | INTRAMUSCULAR | Status: AC
  Administered 2022-07-27: 80 mg

## 2022-07-27 NOTE — Progress Notes (Signed)
Numeric Pain Rating Scale and Functional Assessment Average Pain 10   In the last MONTH (on 0-10 scale) has pain interfered with the following?  1. General activity like being  able to carry out your everyday physical activities such as walking, climbing stairs, carrying groceries, or moving a chair?  Rating(10)   +Driver, -BT, -Dye Allergies. 110/71

## 2022-07-27 NOTE — Patient Instructions (Signed)

## 2022-07-28 NOTE — Procedures (Signed)
Lumbar Facet Joint Intra-Articular Injection(s) with Fluoroscopic Guidance  Patient: Savannah Jackson      Date of Birth: 01/22/1956 MRN: [id number] PCP: Carrolyn Meiers, MD      Visit Date: 07/27/2022   Universal Protocol:    Date/Time: 07/27/2022  Consent Given By: the patient  Position: PRONE   Additional Comments: Vital signs were monitored before and after the procedure. Patient was prepped and draped in the usual sterile fashion. The correct patient, procedure, and site was verified.   Injection Procedure Details:  Procedure Site One Meds Administered:  Meds ordered this encounter  Medications   methylPREDNISolone acetate (DEPO-MEDROL) injection 80 mg     Laterality: Left  Location/Site:  L4-L5 L5-S1  Needle size: 22 guage  Needle type: Spinal  Needle Placement: Articular  Findings:  -Comments: Excellent flow of contrast producing a partial arthrogram.  Procedure Details: The fluoroscope beam is vertically oriented in AP, and the inferior recess is visualized beneath the lower pole of the inferior apophyseal process, which represents the target point for needle insertion. When direct visualization is difficult the target point is located at the medial projection of the vertebral pedicle. The region overlying each aforementioned target is locally anesthetized with a 1 to 2 ml. volume of 1% Lidocaine without Epinephrine.   The spinal needle was inserted into each of the above mentioned facet joints using biplanar fluoroscopic guidance. A 0.25 to 0.5 ml. volume of Isovue-250 was injected and a partial facet joint arthrogram was obtained. A single spot film was obtained of the resulting arthrogram.    One to 1.25 ml of the steroid/anesthetic solution was then injected into each of the facet joints noted above.   Additional Comments:  The patient tolerated the procedure well Dressing: 2 x 2 sterile gauze and Band-Aid    Post-procedure details: Patient  was observed during the procedure. Post-procedure instructions were reviewed.  Patient left the clinic in stable condition.

## 2022-07-28 NOTE — Progress Notes (Signed)
Savannah Jackson - 66 y.o. female MRN [id number]  Date of birth: 03-11-1956  Office Visit Note: Visit Date: 07/27/2022 PCP: Carrolyn Meiers, MD Referred by: Carrolyn Meiers*  Subjective: Chief Complaint  Patient presents with   Lower Back - Pain   HPI:  Savannah Jackson is a 66 y.o. female who comes in today at the request of Dr. Jean Rosenthal for planned Left  L4-5 and L5-S1 Lumbar facet/medial branch block with fluoroscopic guidance.  The patient has failed conservative care including home exercise, medications, time and activity modification.  This injection will be diagnostic and hopefully therapeutic.  Please see requesting physician notes for further details and justification.  Exam has shown concordant pain with facet joint loading.   ROS Otherwise per HPI.  Assessment & Plan: Visit Diagnoses:    ICD-10-CM   1. Lumbar radiculopathy  M54.16 XR C-ARM NO REPORT    methylPREDNISolone acetate (DEPO-MEDROL) injection 80 mg    CANCELED: Epidural Steroid injection    2. Spondylosis without myelopathy or radiculopathy, lumbar region  M47.816 Facet Injection      Plan: No additional findings.   Meds & Orders:  Meds ordered this encounter  Medications   methylPREDNISolone acetate (DEPO-MEDROL) injection 80 mg    Orders Placed This Encounter  Procedures   Facet Injection   XR C-ARM NO REPORT    Follow-up: No follow-ups on file.   Procedures: No procedures performed  Lumbar Facet Joint Intra-Articular Injection(s) with Fluoroscopic Guidance  Patient: Savannah Jackson      Date of Birth: 11/27/1955 MRN: [id number] PCP: Carrolyn Meiers, MD      Visit Date: 07/27/2022   Universal Protocol:    Date/Time: 07/27/2022  Consent Given By: the patient  Position: PRONE   Additional Comments: Vital signs were monitored before and after the procedure. Patient was prepped and draped in the usual sterile fashion. The correct patient,  procedure, and site was verified.   Injection Procedure Details:  Procedure Site One Meds Administered:  Meds ordered this encounter  Medications   methylPREDNISolone acetate (DEPO-MEDROL) injection 80 mg     Laterality: Left  Location/Site:  L4-L5 L5-S1  Needle size: 22 guage  Needle type: Spinal  Needle Placement: Articular  Findings:  -Comments: Excellent flow of contrast producing a partial arthrogram.  Procedure Details: The fluoroscope beam is vertically oriented in AP, and the inferior recess is visualized beneath the lower pole of the inferior apophyseal process, which represents the target point for needle insertion. When direct visualization is difficult the target point is located at the medial projection of the vertebral pedicle. The region overlying each aforementioned target is locally anesthetized with a 1 to 2 ml. volume of 1% Lidocaine without Epinephrine.   The spinal needle was inserted into each of the above mentioned facet joints using biplanar fluoroscopic guidance. A 0.25 to 0.5 ml. volume of Isovue-250 was injected and a partial facet joint arthrogram was obtained. A single spot film was obtained of the resulting arthrogram.    One to 1.25 ml of the steroid/anesthetic solution was then injected into each of the facet joints noted above.   Additional Comments:  The patient tolerated the procedure well Dressing: 2 x 2 sterile gauze and Band-Aid    Post-procedure details: Patient was observed during the procedure. Post-procedure instructions were reviewed.  Patient left the clinic in stable condition.    Clinical History: No specialty comments available.     Objective:  VS:  HT:  WT:   BMI:     BP:   HR: bpm  TEMP: ( )  RESP:  Physical Exam Vitals and nursing note reviewed.  Constitutional:      General: She is not in acute distress.    Appearance: Normal appearance. She is not ill-appearing.  HENT:     Head: Normocephalic and  atraumatic.     Right Ear: External ear normal.     Left Ear: External ear normal.  Eyes:     Extraocular Movements: Extraocular movements intact.  Cardiovascular:     Rate and Rhythm: Normal rate.     Pulses: Normal pulses.  Pulmonary:     Effort: Pulmonary effort is normal. No respiratory distress.  Abdominal:     General: There is no distension.     Palpations: Abdomen is soft.  Musculoskeletal:        General: Tenderness present.     Cervical back: Neck supple.     Right lower leg: No edema.     Left lower leg: No edema.     Comments: Patient has good distal strength with no pain over the greater trochanters.  No clonus or focal weakness.  Skin:    Findings: No erythema, lesion or rash.  Neurological:     General: No focal deficit present.     Mental Status: She is alert and oriented to person, place, and time.     Sensory: No sensory deficit.     Motor: No weakness or abnormal muscle tone.     Coordination: Coordination normal.  Psychiatric:        Mood and Affect: Mood normal.        Behavior: Behavior normal.      Imaging: No results found.

## 2022-07-30 DIAGNOSIS — N022 Recurrent and persistent hematuria with diffuse membranous glomerulonephritis: Principal | ICD-10-CM

## 2022-07-30 DIAGNOSIS — D849 Immunodeficiency, unspecified: Principal | ICD-10-CM

## 2022-07-30 DIAGNOSIS — N042 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: Principal | ICD-10-CM

## 2022-07-30 DIAGNOSIS — Z48298 Encounter for aftercare following other organ transplant: Principal | ICD-10-CM

## 2022-07-30 DIAGNOSIS — Z940 Kidney transplant status: Principal | ICD-10-CM

## 2022-07-31 DIAGNOSIS — Z1159 Encounter for screening for other viral diseases: Principal | ICD-10-CM

## 2022-07-31 DIAGNOSIS — Z940 Kidney transplant status: Principal | ICD-10-CM

## 2022-07-31 DIAGNOSIS — Z79899 Other long term (current) drug therapy: Principal | ICD-10-CM

## 2022-08-07 LAB — CBC W/ DIFFERENTIAL
BANDED NEUTROPHILS ABSOLUTE COUNT: 0 10*3/uL (ref 0.0–0.1)
BASOPHILS ABSOLUTE COUNT: 0.1 10*3/uL (ref 0.0–0.2)
BASOPHILS RELATIVE PERCENT: 1 %
EOSINOPHILS ABSOLUTE COUNT: 0.1 10*3/uL (ref 0.0–0.4)
EOSINOPHILS RELATIVE PERCENT: 2 %
HEMATOCRIT: 43.1 % (ref 34.0–46.6)
HEMOGLOBIN: 13.7 g/dL (ref 11.1–15.9)
IMMATURE GRANULOCYTES: 0 %
LYMPHOCYTES ABSOLUTE COUNT: 1.4 10*3/uL (ref 0.7–3.1)
LYMPHOCYTES RELATIVE PERCENT: 25 %
MEAN CORPUSCULAR HEMOGLOBIN CONC: 31.8 g/dL (ref 31.5–35.7)
MEAN CORPUSCULAR HEMOGLOBIN: 26.6 pg (ref 26.6–33.0)
MEAN CORPUSCULAR VOLUME: 84 fL (ref 79–97)
MONOCYTES ABSOLUTE COUNT: 0.6 10*3/uL (ref 0.1–0.9)
MONOCYTES RELATIVE PERCENT: 10 %
NEUTROPHILS ABSOLUTE COUNT: 3.5 10*3/uL (ref 1.4–7.0)
NEUTROPHILS RELATIVE PERCENT: 62 %
PLATELET COUNT: 288 10*3/uL (ref 150–450)
RED BLOOD CELL COUNT: 5.15 x10E6/uL (ref 3.77–5.28)
RED CELL DISTRIBUTION WIDTH: 13.8 % (ref 11.7–15.4)
WHITE BLOOD CELL COUNT: 5.7 10*3/uL (ref 3.4–10.8)

## 2022-08-08 LAB — PROTEIN / CREATININE RATIO, URINE
CREATININE URINE: 59.2 mg/dL
PROTEIN URINE: 156.9 mg/dL
PROTEIN/CREAT RATIO: 2650 mg/g{creat} — ABNORMAL HIGH (ref 0–200)

## 2022-08-08 LAB — BASIC METABOLIC PANEL
BLOOD UREA NITROGEN: 20 mg/dL (ref 8–27)
BUN / CREAT RATIO: 36 — ABNORMAL HIGH (ref 12–28)
CALCIUM: 9.2 mg/dL (ref 8.7–10.3)
CHLORIDE: 103 mmol/L (ref 96–106)
CO2: 24 mmol/L (ref 20–29)
CREATININE: 0.55 mg/dL — ABNORMAL LOW (ref 0.57–1.00)
EGFR: 101 mL/min/{1.73_m2}
GLUCOSE: 73 mg/dL (ref 70–99)
POTASSIUM: 4.2 mmol/L (ref 3.5–5.2)
SODIUM: 142 mmol/L (ref 134–144)

## 2022-08-08 LAB — URINALYSIS WITH MICROSCOPY
BILIRUBIN UA: NEGATIVE
GLUCOSE UA: NEGATIVE
KETONES UA: NEGATIVE
LEUKOCYTE ESTERASE UA: NEGATIVE
NITRITE UA: NEGATIVE
PH UA: 5.5 (ref 5.0–7.5)
SPECIFIC GRAVITY UA: 1.019 (ref 1.005–1.030)
UROBILINOGEN UA: 0.2 mg/dL (ref 0.2–1.0)

## 2022-08-08 LAB — MICROSCOPIC EXAMINATION
BACTERIA: NONE SEEN
CASTS: NONE SEEN /LPF

## 2022-08-08 LAB — PHOSPHORUS: PHOSPHORUS, SERUM: 4.5 mg/dL — ABNORMAL HIGH (ref 3.0–4.3)

## 2022-08-08 LAB — MAGNESIUM: MAGNESIUM: 1.5 mg/dL — ABNORMAL LOW (ref 1.6–2.3)

## 2022-08-09 LAB — TACROLIMUS LEVEL: TACROLIMUS BLOOD: 3.4 ng/mL (ref 2.0–20.0)

## 2022-08-16 NOTE — Unmapped (Signed)
08/16/22: patient verified envarsus is approved from the mfg until mid-2024 and she will get it from [facility] not [facility]. She is aware however to contact us or clinic if any issues with deliveries as per onboarding note in August, [facility] could fill for her if needed [name]    [facility] Clinical Assessment & Refill Coordination Note    Michelle Travis, DOB: 07/13/1956  Phone: [phone] (home)     All above HIPAA information was verified with patient.     Was a Nurse, learning disability used for this call? No    Specialty Medication(s):   Transplant:  mycophenolic acid 180mg      Current Outpatient Medications   Medication Sig Dispense Refill   ??? acetaminophen (TYLENOL 8 HOUR) 650 MG CR tablet Take 1 tablet (650 mg total) by mouth every four (4) hours as needed for pain. 30 tablet 0   ??? aspirin 81 MG chewable tablet Chew 1 tablet (81 mg total) daily. 90 tablet 3   ??? atorvastatin (LIPITOR) 10 MG tablet Take 1 tablet (10 mg total) by mouth daily. 90 tablet 3   ??? BELBUCA 300 mcg buccal film Apply 1 Film (300 mcg total) to cheek at bedtime.     ??? cholecalciferol, vitamin D3 25 mcg, 1,000 units,, 1,000 unit (25 mcg) tablet Take 1 tablet (25 mcg total) by mouth in the morning. 90 tablet 3   ??? ENVARSUS XR 1 mg Tb24 extended release tablet Take 1 tablet (1 mg total) by mouth in the morning. Take with TWO 0.75 mg tablets (1.5 mg total) by mouth in the morning for a total of 2.5 mg.. 90 tablet 3   ??? mycophenolate (MYFORTIC) 180 MG EC tablet Take 2 tablets (360 mg total) by mouth two (2) times a day. 360 tablet 3   ??? oxyCODONE (ROXICODONE) 10 mg immediate release tablet TAKE 1 (ONE) TABLET BY MOUTH FOUR TIMES DAILY, AS NEEDED     ??? pantoprazole (PROTONIX) 40 MG tablet Take 1 tablet (40 mg total) by mouth daily. 90 tablet 3   ??? tacrolimus (ENVARSUS XR) 0.75 mg Tb24 extended release tablet Take 2 tablets (1.5 mg total) by mouth in the morning. Take with ONE 1 mg tablet by mouth in the morning for a total of 2.5 mg.. 180 tablet 3     No current facility-administered medications for this visit.        Changes to medications: Michelle Travis reports no changes at this time.    Allergies   Allergen Reactions   ??? Latex Swelling and Rash     Discoloration of skin.   Discoloration of skin.    ??? Hydrocodone Itching   ??? Hydrocodone-Acetaminophen Itching       Changes to allergies: No    SPECIALTY MEDICATION ADHERENCE     Mycophenolate 180mg   : 40 days of medicine on hand   *    Medication Adherence    Patient reported X missed doses in the last month: 0  Specialty Medication: mycophenolate 180mg       Adherence tools used: patient uses a pill box to manage medications                  Specialty medication(s) dose(s) confirmed: Regimen is correct and unchanged.     Are there any concerns with adherence? No    Adherence counseling provided? Not needed    CLINICAL MANAGEMENT AND INTERVENTION      Clinical Benefit Assessment:    Do you  feel the medicine is effective or helping your condition? Yes    Clinical Benefit counseling provided? Not needed    Adverse Effects Assessment:    Are you experiencing any side effects? No    Are you experiencing difficulty administering your medicine? No    Quality of Life Assessment:    Quality of Life    Rheumatology  Oncology  Dermatology  Cystic Fibrosis          How many days over the past month did your transplant  keep you from your normal activities? For example, brushing your teeth or getting up in the morning. 0    Have you discussed this with your provider? Not needed    Acute Infection Status:    Acute infections noted within Epic:  No active infections  Patient reported infection: None    Therapy Appropriateness:    Is therapy appropriate and patient progressing towards therapeutic goals? Yes, therapy is appropriate and should be continued    DISEASE/MEDICATION-SPECIFIC INFORMATION      N/A    Solid Organ Transplant: Not Applicable    PATIENT SPECIFIC NEEDS     - Does the patient have any physical, cognitive, or cultural barriers? No    - Is the patient high risk? Yes, patient is taking a REMS drug. Medication is dispensed in compliance with REMS program    - Did the patient require a clinical intervention? No    - Does the patient require physician intervention or other additional services (i.e., nutrition, smoking cessation, social work)? No    SOCIAL DETERMINANTS OF HEALTH     At the [facility], we have learned that life circumstances - like trouble affording food, housing, utilities, or transportation can affect the health of many of our patients.   That is why we wanted to ask: are you currently experiencing any life circumstances that are negatively impacting your health and/or quality of life? No    Social Determinants of Health     Financial Resource Strain: Low Risk  (05/26/2022)    Overall Financial Resource Strain (CARDIA)    ??? Difficulty of Paying Living Expenses: Not very hard   Internet Connectivity: Not on file   Food Insecurity: No Food Insecurity (05/26/2022)    Hunger Vital Sign    ??? Worried About Running Out of Food in the Last Year: Never true    ??? Ran Out of Food in the Last Year: Never true   Tobacco Use: Low Risk  (06/22/2022)    Patient History    ??? Smoking Tobacco Use: Never    ??? Smokeless Tobacco Use: Never    ??? Passive Exposure: Never   Housing/Utilities: Low Risk  (05/26/2022)    Housing/Utilities    ??? Within the past 12 months, have you ever stayed: outside, in a car, in a tent, in an overnight shelter, or temporarily in someone else's home (i.e. couch-surfing)?: No    ??? Are you worried about losing your housing?: No    ??? Within the past 12 months, have you been unable to get utilities (heat, electricity) when it was really needed?: No   Alcohol Use: Not on file   Transportation Needs: No Transportation Needs (05/26/2022)    PRAPARE - Transportation    ??? Lack of Transportation (Medical): No    ??? Lack of Transportation (Non-Medical): No   Substance Use: Not on file   Health Literacy: Not on file   Physical Activity: Not on file  Interpersonal Safety: Not on file   Stress: Not on file   Intimate Partner Violence: Not on file   Depression: Not at risk (02/24/2021)    PHQ-2    ??? PHQ-2 Score: 0   Social Connections: Not on file       Would you be willing to receive help with any of the needs that you have identified today? Not applicable       SHIPPING     Specialty Medication(s) to be Shipped:   n/a    Other medication(s) to be shipped: No additional medications requested for fill at this time     Changes to insurance: No    Delivery Scheduled: Patient declined refill at this time due to supply [facility] meds from [facility], pt requests call back in 1 month.     Medication will be delivered via n/a to the confirmed n/a address in [facility].    The patient will receive a drug information handout for each medication shipped and additional FDA Medication Guides as required.  Verified that patient has previously received a Conservation officer, historic buildings and a Surveyor, mining.    The patient or caregiver noted above participated in the development of this care plan and knows that they can request review of or adjustments to the care plan at any time.      All of the patient's questions and concerns have been addressed.    [name], PharmD   [facility] Specialty Pharmacist

## 2022-08-28 DIAGNOSIS — Z79899 Other long term (current) drug therapy: Principal | ICD-10-CM

## 2022-08-28 DIAGNOSIS — Z1159 Encounter for screening for other viral diseases: Principal | ICD-10-CM

## 2022-08-28 DIAGNOSIS — Z940 Kidney transplant status: Principal | ICD-10-CM

## 2022-09-04 DIAGNOSIS — Z944 Liver transplant status: Principal | ICD-10-CM

## 2022-09-10 DIAGNOSIS — N022 Recurrent and persistent hematuria with diffuse membranous glomerulonephritis: Principal | ICD-10-CM

## 2022-09-10 DIAGNOSIS — N042 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: Principal | ICD-10-CM

## 2022-09-10 DIAGNOSIS — Z48298 Encounter for aftercare following other organ transplant: Principal | ICD-10-CM

## 2022-09-10 DIAGNOSIS — D849 Immunodeficiency, unspecified: Principal | ICD-10-CM

## 2022-09-10 DIAGNOSIS — Z940 Kidney transplant status: Principal | ICD-10-CM

## 2022-09-11 NOTE — Unmapped (Signed)
[facility] Refill Coordination Note    Specialty Medication(s) to be Shipped:   Transplant: mycophenolate mofetil 180mg     Other medication(s) to be shipped:  aspirin , atorvastatin , vitamin d3 and pantoprazole      ALINAH NIENOW, DOB: 07-Feb-1956  Phone: [phone] (home)       All above HIPAA information was verified with patient.     Was a Nurse, learning disability used for this call? No    Completed refill call assessment today to schedule patient's medication shipment from the [facility] [phone]).  All relevant notes have been reviewed.     Specialty medication(s) and dose(s) confirmed: Regimen is correct and unchanged.   Changes to medications: Shilo reports no changes at this time.  Changes to insurance: No  New side effects reported not previously addressed with a pharmacist or physician: None reported  Questions for the pharmacist: No    Confirmed patient received a Conservation officer, historic buildings and a Surveyor, mining with first shipment. The patient will receive a drug information handout for each medication shipped and additional FDA Medication Guides as required.       DISEASE/MEDICATION-SPECIFIC INFORMATION        N/A    SPECIALTY MEDICATION ADHERENCE     Medication Adherence    Patient reported X missed doses in the last month: 0  Specialty Medication: mycophenolate 180 MG EC tablet (MYFORTIC)  Patient is on additional specialty medications: No      Adherence tools used: patient uses a pill box to manage medications                          Were doses missed due to medication being on hold? No    mycophenolate 180 mg: 10 days of medicine on hand       REFERRAL TO PHARMACIST     Referral to the pharmacist: Not needed      [facility]     Shipping address confirmed in Epic.     Delivery Scheduled: Yes, Expected medication delivery date: 09/15/22.     Medication will be delivered via UPS to the prescription address in Epic [facility].    Quintella Reichert   [facility] Specialty Technician

## 2022-09-14 MED FILL — PANTOPRAZOLE 40 MG TABLET,DELAYED RELEASE: ORAL | 90 days supply | Qty: 90 | Fill #1

## 2022-09-14 MED FILL — ATORVASTATIN 10 MG TABLET: ORAL | 90 days supply | Qty: 90 | Fill #1

## 2022-09-14 MED FILL — CHOLECALCIFEROL (VITAMIN D3) 25 MCG (1,000 UNIT) TABLET: ORAL | 90 days supply | Qty: 90 | Fill #1

## 2022-09-14 MED FILL — ASPIRIN 81 MG CHEWABLE TABLET: ORAL | 90 days supply | Qty: 90 | Fill #1

## 2022-09-14 MED FILL — MYCOPHENOLATE SODIUM 180 MG TABLET,DELAYED RELEASE: ORAL | 90 days supply | Qty: 360 | Fill #1

## 2022-09-25 DIAGNOSIS — Z1159 Encounter for screening for other viral diseases: Principal | ICD-10-CM

## 2022-09-25 DIAGNOSIS — Z940 Kidney transplant status: Principal | ICD-10-CM

## 2022-09-25 DIAGNOSIS — Z79899 Other long term (current) drug therapy: Principal | ICD-10-CM

## 2022-10-04 LAB — MICROSCOPIC EXAMINATION
CASTS: NONE SEEN /LPF
EPITHELIAL CELLS (NON RENAL): >10 /HPF — AB (ref 0–10)

## 2022-10-04 LAB — URINALYSIS WITH MICROSCOPY
BILIRUBIN UA: NEGATIVE
GLUCOSE UA: NEGATIVE
KETONES UA: NEGATIVE
LEUKOCYTE ESTERASE UA: NEGATIVE
NITRITE UA: NEGATIVE
PH UA: 5.5 (ref 5.0–7.5)
SPECIFIC GRAVITY UA: >=1.03 — AB (ref 1.005–1.030)
UROBILINOGEN UA: 1 mg/dL (ref 0.2–1.0)

## 2022-10-04 LAB — PHOSPHORUS: PHOSPHORUS, SERUM: 3.6 mg/dL (ref 3.0–4.3)

## 2022-10-04 LAB — BASIC METABOLIC PANEL
BLOOD UREA NITROGEN: 16 mg/dL (ref 8–27)
BUN / CREAT RATIO: 25 (ref 12–28)
CALCIUM: 9.3 mg/dL (ref 8.7–10.3)
CHLORIDE: 104 mmol/L (ref 96–106)
CO2: 25 mmol/L (ref 20–29)
CREATININE: 0.63 mg/dL (ref 0.57–1.00)
EGFR: 98 mL/min/{1.73_m2}
GLUCOSE: 102 mg/dL — ABNORMAL HIGH (ref 70–99)
POTASSIUM: 4.6 mmol/L (ref 3.5–5.2)
SODIUM: 143 mmol/L (ref 134–144)

## 2022-10-04 LAB — MAGNESIUM: MAGNESIUM: 1.9 mg/dL (ref 1.6–2.3)

## 2022-10-04 NOTE — Unmapped (Signed)
Pt noted to have labs drawn yesterday, called [facility] to provide verbal add-on for CMP level (#322000).

## 2022-10-05 LAB — COMPREHENSIVE METABOLIC PANEL
A/G RATIO: 1.6 (ref 1.2–2.2)
ALBUMIN: 4.1 g/dL (ref 3.9–4.9)
ALKALINE PHOSPHATASE: 60 IU/L (ref 44–121)
ALT (SGPT): 13 IU/L (ref 0–32)
AST (SGOT): 15 IU/L (ref 0–40)
BILIRUBIN TOTAL (MG/DL) IN SER/PLAS: 0.3 mg/dL (ref 0.0–1.2)
BLOOD UREA NITROGEN: 16 mg/dL (ref 8–27)
BUN / CREAT RATIO: 24 (ref 12–28)
CALCIUM: 9.5 mg/dL (ref 8.7–10.3)
CHLORIDE: 108 mmol/L — ABNORMAL HIGH (ref 96–106)
CO2: 24 mmol/L (ref 20–29)
CREATININE: 0.68 mg/dL (ref 0.57–1.00)
EGFR: 96 mL/min/{1.73_m2}
GLOBULIN, TOTAL: 2.6 g/dL (ref 1.5–4.5)
GLUCOSE: 103 mg/dL — ABNORMAL HIGH (ref 70–99)
POTASSIUM: 4.8 mmol/L (ref 3.5–5.2)
SODIUM: 148 mmol/L — ABNORMAL HIGH (ref 134–144)
TOTAL PROTEIN: 6.7 g/dL (ref 6.0–8.5)

## 2022-10-05 LAB — CBC W/ DIFFERENTIAL
BANDED NEUTROPHILS ABSOLUTE COUNT: 0 10*3/uL (ref 0.0–0.1)
BASOPHILS ABSOLUTE COUNT: 0.1 10*3/uL (ref 0.0–0.2)
BASOPHILS RELATIVE PERCENT: 2 %
EOSINOPHILS ABSOLUTE COUNT: 0.1 10*3/uL (ref 0.0–0.4)
EOSINOPHILS RELATIVE PERCENT: 2 %
HEMATOCRIT: 42.8 % (ref 34.0–46.6)
HEMOGLOBIN: 13.6 g/dL (ref 11.1–15.9)
IMMATURE GRANULOCYTES: 0 %
LYMPHOCYTES ABSOLUTE COUNT: 1.2 10*3/uL (ref 0.7–3.1)
LYMPHOCYTES RELATIVE PERCENT: 51 %
MEAN CORPUSCULAR HEMOGLOBIN CONC: 31.8 g/dL (ref 31.5–35.7)
MEAN CORPUSCULAR HEMOGLOBIN: 26.1 pg — ABNORMAL LOW (ref 26.6–33.0)
MEAN CORPUSCULAR VOLUME: 82 fL (ref 79–97)
MONOCYTES ABSOLUTE COUNT: 0.7 10*3/uL (ref 0.1–0.9)
MONOCYTES RELATIVE PERCENT: 29 %
NEUTROPHILS ABSOLUTE COUNT: 0.4 10*3/uL — CL (ref 1.4–7.0)
NEUTROPHILS RELATIVE PERCENT: 16 %
PLATELET COUNT: 389 10*3/uL (ref 150–450)
RED BLOOD CELL COUNT: 5.21 x10E6/uL (ref 3.77–5.28)
RED CELL DISTRIBUTION WIDTH: 13.1 % (ref 11.7–15.4)
WHITE BLOOD CELL COUNT: 2.4 10*3/uL — CL (ref 3.4–10.8)

## 2022-10-05 LAB — PROTEIN / CREATININE RATIO, URINE
CREATININE URINE: 267.8 mg/dL
PROTEIN URINE: 371.5 mg/dL
PROTEIN/CREAT RATIO: 1387 mg/g{creat} — ABNORMAL HIGH (ref 0–200)

## 2022-10-05 LAB — TACROLIMUS LEVEL: TACROLIMUS BLOOD: 4.1 ng/mL (ref 2.0–20.0)

## 2022-10-06 DIAGNOSIS — Z944 Liver transplant status: Principal | ICD-10-CM

## 2022-10-06 DIAGNOSIS — Z796 Long-term use of immunosuppressant medication: Principal | ICD-10-CM

## 2022-10-06 NOTE — Unmapped (Signed)
Reviewed 12/5 ANC=0.4 with Dr. Sherryll Burger:  From: Pia Mau, MD  Sent: 10/06/2022   8:14 AM EST  To: Johnna Acosta, RN; *  Subject: RE: ANC 0.4                                      That's pretty odd to happen all of a sudden. Can we repeat first?    ----- Message -----  From: Johnna Acosta, RN  Sent: 10/06/2022   7:44 AM EST  To: Clayborne Artist, MD; *  Subject: ANC 0.4                                          She's never been neutropenic that I can recall. Should we set her up for nivestym?    Called pt to check in and review 12/5 labs. Pt scheduled for a follow up visit with txp nephrology on 12/13 at [facility] clinic, lab orders entered for same day.

## 2022-10-08 DIAGNOSIS — Z48298 Encounter for aftercare following other organ transplant: Principal | ICD-10-CM

## 2022-10-08 DIAGNOSIS — Z940 Kidney transplant status: Principal | ICD-10-CM

## 2022-10-08 DIAGNOSIS — D849 Immunodeficiency, unspecified: Principal | ICD-10-CM

## 2022-10-08 DIAGNOSIS — N022 Recurrent and persistent hematuria with diffuse membranous glomerulonephritis: Principal | ICD-10-CM

## 2022-10-08 DIAGNOSIS — N042 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: Principal | ICD-10-CM

## 2022-10-10 DIAGNOSIS — Z796 Long-term use of immunosuppressant medication: Principal | ICD-10-CM

## 2022-10-10 DIAGNOSIS — Z940 Kidney transplant status: Principal | ICD-10-CM

## 2022-10-11 ENCOUNTER — Ambulatory Visit: Admit: 2022-10-11 | Discharge: 2022-10-12 | Payer: MEDICARE

## 2022-10-11 ENCOUNTER — Ambulatory Visit: Admit: 2022-10-11 | Discharge: 2022-10-12 | Payer: MEDICARE | Attending: Nephrology | Primary: Nephrology

## 2022-10-11 DIAGNOSIS — Z796 Long-term use of immunosuppressant medication: Principal | ICD-10-CM

## 2022-10-11 DIAGNOSIS — Z940 Kidney transplant status: Principal | ICD-10-CM

## 2022-10-11 DIAGNOSIS — Z944 Liver transplant status: Principal | ICD-10-CM

## 2022-10-11 LAB — CBC W/ AUTO DIFF
BASOPHILS ABSOLUTE COUNT: 0.1 10*9/L (ref 0.0–0.1)
BASOPHILS RELATIVE PERCENT: 1.4 %
EOSINOPHILS ABSOLUTE COUNT: 0.2 10*9/L (ref 0.0–0.5)
EOSINOPHILS RELATIVE PERCENT: 3.7 %
HEMATOCRIT: 40.9 % (ref 34.0–44.0)
HEMOGLOBIN: 13.4 g/dL (ref 11.3–14.9)
LYMPHOCYTES ABSOLUTE COUNT: 2.1 10*9/L (ref 1.1–3.6)
LYMPHOCYTES RELATIVE PERCENT: 43.2 %
MEAN CORPUSCULAR HEMOGLOBIN CONC: 32.8 g/dL (ref 32.0–36.0)
MEAN CORPUSCULAR HEMOGLOBIN: 26 pg (ref 25.9–32.4)
MEAN CORPUSCULAR VOLUME: 79.4 fL (ref 77.6–95.7)
MEAN PLATELET VOLUME: 8.8 fL (ref 6.8–10.7)
MONOCYTES ABSOLUTE COUNT: 1.2 10*9/L — ABNORMAL HIGH (ref 0.3–0.8)
MONOCYTES RELATIVE PERCENT: 23.7 %
NEUTROPHILS ABSOLUTE COUNT: 1.4 10*9/L — ABNORMAL LOW (ref 1.8–7.8)
NEUTROPHILS RELATIVE PERCENT: 28 %
NUCLEATED RED BLOOD CELLS: 0 /100{WBCs} (ref <=4)
PLATELET COUNT: 347 10*9/L (ref 150–450)
RED BLOOD CELL COUNT: 5.15 10*12/L — ABNORMAL HIGH (ref 3.95–5.13)
RED CELL DISTRIBUTION WIDTH: 14.4 % (ref 12.2–15.2)
WBC ADJUSTED: 4.9 10*9/L (ref 3.6–11.2)

## 2022-10-11 LAB — URINALYSIS WITH MICROSCOPY
BILIRUBIN UA: NEGATIVE
GLUCOSE UA: NEGATIVE
KETONES UA: NEGATIVE
LEUKOCYTE ESTERASE UA: NEGATIVE
NITRITE UA: NEGATIVE
PH UA: 6 (ref 5.0–9.0)
PROTEIN UA: >300 — AB
RBC UA: <1 /HPF (ref <4)
SPECIFIC GRAVITY UA: >=1.03 (ref 1.005–1.030)
SQUAMOUS EPITHELIAL: >100 /HPF — ABNORMAL HIGH (ref 0–5)
UROBILINOGEN UA: 1
WBC UA: 50 /HPF — ABNORMAL HIGH (ref 0–5)

## 2022-10-11 LAB — BILIRUBIN, DIRECT: BILIRUBIN DIRECT: 0.1 mg/dL (ref 0.00–0.30)

## 2022-10-11 LAB — PROTEIN / CREATININE RATIO, URINE
CREATININE, URINE: 243.9 mg/dL
PROTEIN URINE: 359.6 mg/dL
PROTEIN/CREAT RATIO, URINE: 1.474

## 2022-10-11 LAB — MAGNESIUM: MAGNESIUM: 1.5 mg/dL — ABNORMAL LOW (ref 1.6–2.6)

## 2022-10-11 LAB — COMPREHENSIVE METABOLIC PANEL
ALBUMIN: 3.5 g/dL (ref 3.4–5.0)
ALKALINE PHOSPHATASE: 57 U/L (ref 46–116)
ALT (SGPT): 12 U/L (ref 10–49)
ANION GAP: 6 mmol/L (ref 5–14)
AST (SGOT): 19 U/L (ref <=34)
BILIRUBIN TOTAL: 0.5 mg/dL (ref 0.3–1.2)
BLOOD UREA NITROGEN: 15 mg/dL (ref 9–23)
BUN / CREAT RATIO: 27
CALCIUM: 9.1 mg/dL (ref 8.7–10.4)
CHLORIDE: 106 mmol/L (ref 98–107)
CO2: 31.3 mmol/L — ABNORMAL HIGH (ref 20.0–31.0)
CREATININE: 0.55 mg/dL
EGFR CKD-EPI (2021) FEMALE: >90 mL/min/{1.73_m2} (ref >=60)
GLUCOSE RANDOM: 101 mg/dL (ref 70–179)
POTASSIUM: 4.4 mmol/L (ref 3.4–4.8)
PROTEIN TOTAL: 6.5 g/dL (ref 5.7–8.2)
SODIUM: 143 mmol/L (ref 135–145)

## 2022-10-11 LAB — ALBUMIN / CREATININE URINE RATIO
ALBUMIN QUANT URINE: 201 mg/dL
ALBUMIN/CREATININE RATIO: 824.1 ug/mg — ABNORMAL HIGH (ref 0.0–30.0)
CREATININE, URINE: 243.9 mg/dL

## 2022-10-11 LAB — TACROLIMUS LEVEL, TROUGH: TACROLIMUS, TROUGH: 4.2 ng/mL — ABNORMAL LOW (ref 5.0–15.0)

## 2022-10-11 LAB — GAMMA GT: GAMMA GLUTAMYL TRANSFERASE: 26 U/L

## 2022-10-11 LAB — PHOSPHORUS: PHOSPHORUS: 3.5 mg/dL (ref 2.4–5.1)

## 2022-10-11 NOTE — Unmapped (Signed)
Transplant Coordinator, Clinic Visit   Pt seen today by transplant nephrology for follow up, reviewed medications and symptoms.          10/11/22 0907   BP: 119/85   Pulse: 55   Temp: 36.2 ??C (97.2 ??F)   Weight: 80.6 kg (177 lb 12.8 oz)   PainSc: 8    PainLoc: Back       Assessment  BP @ Home: Not checking   BG: N/A  HA/Dizziness/Lightheaded: HA 2-3x week, taking Tylenol helps  Hand tremors: No  Numbness/tingling: No  Fevers/Chills/sweats: No  Chest Pain/SOB: No  N/V/Heartburn: No  Diarrhea/constipation: No  UTI symptoms: No  Swelling: No  Pain: On going, lower back and left knee. Pain controlled with oxy and belbuca      Good appetite; reports adequate hydration.    Any new medications? No  Immunosuppressant last taken: 9AM yesterday, repeat labs today - WBC 4.9 today    Immunization status: Flu completed, covid booster today    Functional Score: 100   Normal no complaints; no evidence of  disease.     Pap smear appt 11/25/22  Mammogram up to date    Rituxamab 1g due 12/21, reached out to [facility] to schedule in late December or Jan per Dr. Margaretmary Bayley.

## 2022-10-11 NOTE — Unmapped (Signed)
Left a detailed message with schedule for Rituxamab at [facility] on 01/19/23 @ 8AM.

## 2022-10-11 NOTE — Unmapped (Signed)
Transplant Nephrology Clinic Visit    Assessment and Plan  Michelle Travis is a 66 y.o. 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 She has recurrent PLA2R positive membranous nephropathy and is seen for follow up of her transplant and associated medical problems.     Status post kidney transplant with membranous nephropathy recurrence.  - Kidney biopsy 03/11/21 with PLA2R positive membranous nephropathy  - Initial treatment included Rituximab 1 gm on 03/23/21 and 1 gm on 04/07/21 and 125 mg solumedrol x 2 given with the doses of Rituximab.   - Recurrence of nephrotic range proteinuria treated with Rituximab 1 gm 02/07/22 and 03/07/22 and 07/20/22   - UPC after biopsy peaked at 5.02 on 03/23/21 improved to 0.623 on 07/20/21 only to return to nephrotic range. UPC today is down to 1.474 from a peak of 8.66 on 02/07/22.     - Serum is stable at 0.55 mg/dL (baseline < 0.8 mg/dL)  - Will plan another dose of rituximab in Feb or March 2024.    - Patient at increased risk of DVT in view of membranous nephropathy recurrence. Albumin today has normalized at 3.5.  Exam does not suggest DVT. I have educated her about the signs/symptoms of DVT/PE. Will continue aspirin.     S/P Liver transplant with excellent liver function.   - LFTs normal today  - She will continue to follow with the liver transplant team.     Immunosuppression.   - Tacrolimus level is 4.2 (target 3-6).   - Continue Envarsus 2.5 mg daily   - Continue Myfortic 360 mg BID.    Rotavirus infection, 04/2022  - Resulted in hospitalization 05/25/22-05/27/22 for treatment of volume depletion and AKI  - Symptoms now resolved  - No further therapy is warranted.     BP Management  - BP 119/85  - Currently on no antihypertensive therapy.    S/P right total hip replacement, 11/2021  - She will follow up with ortho clinic.    Bilateral knee pain.   - I have previously suggested planned possible TKR be delayed until the response to membranous recurrence treatment can be fully evaluated. This is predominantly related to concern about post-op DVT. With improvement in proteinuria and serum albumin level she can once again engage her ortho team regarding timing for possible knee surgery.     History of IgG lambda monoclonal gammopathy.   - SPEP with immunofixation 03/19/20 revealed no monoclonal protein. Kidney biopsy revealed no evidence of MGRS.  - Will periodically recheck SPEP with immunofixation    History of sleep apnea.   - She is no longer on CPAP.  - She denies daytime drowsiness.     Immunizations.  -  Prevnar-20 07/20/21.  - Flu vaccine done locally Fall 2023  - Shingrix x 2 completed  - COVID-19  spikevax booster 10/11/22  - RSV vaccine to be done locally     Cancer screening.   - Mammography 05/29/22 negative, has remote history of breast cancer.  - PAP smear scheduled for 11/25/22.  - Colonoscopy 08/07/18 with single 6 mm polyp and diverticulosis.     Follow-up.   Patient will return to clinic in 3 months.   Monthly labs including UPC.  Rituximab 1 gm in Feb or March 2024      History of Present Illness  Michelle Travis is a 66 y.o. 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  She was found to have post-transplant proteinuria (UPC 2.31 and 5.02) and a kidney biopsy 03/11/21 revealed recurrent membranous nephropathy. She received 2 doses of Rituximab 1 gm on 03/23/21 and 04/07/21 with subsequent improvement in proteinuria to 0.623 on 07/20/21. UPC increased to 6.94 on 01/27/22 and she was treated with 1 gm rituximab again on 02/07/22 and 03/07/22 and 07/20/22.     She was admitted to [facility] from transplant clinic on 05/25/22 with AKI secondary to rotavirus associated volume depletion. Serum creatinine returned to baseline at discharge. There has been no recurrence of nausea, vomiting or diarrhea.      All GI symptoms have completely resolved. She denies nausea, vomiting, diarrhea, fever, chills, abdominal pain, dysuria, or allograft tenderness. She denies headaches or tremors. She denies chest pain, shortness of breath, or edema. She denies dysuria, allograft tenderness, chest pain, shortness of breath, edema, fever, chills, or tremors. She has headaches up to 2 to 3 times per week. BP has been 110's/80's on average.     Left knee knee surgery has been postponed until membranous nephropathy is in a lower risk phase or in remission. She continues to have knee pain limiting her mobility. She also has low back pain.    Transplant History:  1. 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 S/P kidney and liver transplants on 09/13/17 at [facility]. Kidney KDPI 26%. CMV D+/R+; EBV D-/R+, hep C Ab +/NAT- (recipient hep C Ab negative)  4. Complicated by rectus sheath hematoma  5. Delayed graft function requiring hemodialysis sessions following transplant (stopped dialysis 11/26/018).  6. Immunosuppression was ??? induction followed by tacrolimus and myfortic maintenance therapy.   7. Baseline creatinine <1.0 mg/dL.  8. Kidney biopsy 03/11/21 with recurrent PLA2R positive membranous nephropathy, treated with Rituximab 1 gm x 4 (03/23/21, 04/07/21, 02/07/22, 03/07/22)   9. Rotavirus infection 04/2022 with AKI secondary to volume depletion    Past Medical History:  1. Alcoholic cirrhosis, S/P TIPS  2. Membranous nephropathy, PLA2R positive biopsy 08/30/2016, treated solumedrol initially with little improvement then with Rituximab (10/2016 and 03/2017).   3. S/P kidney and liver transplants as stated above  4. History of breast cancer, in remission for 17 years, s/p lumpectomy.  5. History of IGG monoclonal gammopathy, IgG lambda. Kidney biopsy 08/30/2016 without MGRS.  6. S/p cerebral aneurysm repair.  7. S/P total knee replacement, right   8. Essential HTN prior to ESLD  9. History of diverticulitis  10. Recurrent pleural effusion, history of pneumonia x 3  11. Osteopenia by DEXA 06/14/2016  12. Hypoattenuating thyroid nodule noted on chest CT 11/11/2016  13. Sleep apnea    Review of Systems    All other systems are reviewed and are negative.    Medications    Current Outpatient Medications   Medication Sig Dispense Refill    acetaminophen (TYLENOL 8 HOUR) 650 MG CR tablet Take 1 tablet (650 mg total) by mouth every four (4) hours as needed for pain. 30 tablet 0    aspirin 81 MG chewable tablet Chew 1 tablet (81 mg total) daily. 90 tablet 3    atorvastatin (LIPITOR) 10 MG tablet Take 1 tablet (10 mg total) by mouth daily. 90 tablet 3    BELBUCA 300 mcg buccal film Apply 1 Film (300 mcg total) to cheek at bedtime.      cholecalciferol, vitamin D3 25 mcg, 1,000 units,, 1,000 unit (  25 mcg) tablet Take 1 tablet (25 mcg total) by mouth in the morning. 90 tablet 3    ENVARSUS XR 1 mg Tb24 extended release tablet Take 1 tablet (1 mg total) by mouth in the morning. Take with TWO 0.75 mg tablets (1.5 mg total) by mouth in the morning for a total of 2.5 mg.. 90 tablet 3    mycophenolate (MYFORTIC) 180 MG EC tablet Take 2 tablets (360 mg total) by mouth two (2) times a day. 360 tablet 3    oxyCODONE (ROXICODONE) 10 mg immediate release tablet TAKE 1 (ONE) TABLET BY MOUTH FOUR TIMES DAILY, AS NEEDED      pantoprazole (PROTONIX) 40 MG tablet Take 1 tablet (40 mg total) by mouth daily. 90 tablet 3    tacrolimus (ENVARSUS XR) 0.75 mg Tb24 extended release tablet Take 2 tablets (1.5 mg total) by mouth in the morning. Take with ONE 1 mg tablet by mouth in the morning for a total of 2.5 mg.. 180 tablet 3     No current facility-administered medications for this visit.       Physical Exam    BP 119/85 (BP Site: R Arm, BP Position: Sitting, BP Cuff Size: Large)  - Pulse 55  - Temp 36.2 ??C (97.2 ??F) (Temporal)  - Wt 80.6 kg (177 lb 12.8 oz)  - LMP  (LMP Unknown)  - BMI 31.50 kg/m??   General: Patient is a pleasant female in no apparent distress.  Eyes: Sclera anicteric.  Neck: Supple without LAD/JVD/bruits.  Lungs: Clear to auscultation bilaterally, no wheezes/rales/rhonchi.  Cardiovascular: Regular rate and rhythm without murmurs, rubs or gallops.  Abdomen: Soft, notender/nondistended. Positive bowel sounds. No hepatosplenomegaly, masses or bruits appreciated.   Extremities: no edema  Skin: Without rash  Neurological: Grossly nonfocal.  Psychiatric: Mood and affect appropriate.    Laboratory Results and Imaging Reviewed

## 2022-10-11 NOTE — Unmapped (Signed)
Left urine at lab

## 2022-10-11 NOTE — Unmapped (Signed)
TRF

## 2022-10-12 LAB — EBV QUANTITATIVE PCR, BLOOD: EBV VIRAL LOAD RESULT: NOT DETECTED

## 2022-10-12 NOTE — Unmapped (Signed)
Called pt to review 12/13 stable labs, ANC improved. No changes for now, will repeat labs in 3 mos.

## 2022-10-18 NOTE — Unmapped (Signed)
12/20 - Contacted pt to schedule annual liver txp follow up and LVM for pt to return ph call.

## 2022-10-19 NOTE — Unmapped (Signed)
12/21 - Received call back from pt to able to schedule '24 annual liver txp follow up to have labs with provider appt on same date. Pt okay with letter by Mail and verbalized understanding all discussed.

## 2022-10-23 DIAGNOSIS — Z79899 Other long term (current) drug therapy: Principal | ICD-10-CM

## 2022-10-23 DIAGNOSIS — Z940 Kidney transplant status: Principal | ICD-10-CM

## 2022-10-23 DIAGNOSIS — Z1159 Encounter for screening for other viral diseases: Principal | ICD-10-CM

## 2022-10-25 ENCOUNTER — Encounter: Admit: 2022-10-25 | Discharge: 2022-10-27 | Payer: MEDICARE

## 2022-10-25 ENCOUNTER — Ambulatory Visit: Admit: 2022-10-25 | Discharge: 2022-10-27 | Payer: MEDICARE

## 2022-10-25 ENCOUNTER — Encounter
Admit: 2022-10-25 | Discharge: 2022-10-27 | Payer: MEDICARE | Attending: Student in an Organized Health Care Education/Training Program | Primary: Student in an Organized Health Care Education/Training Program

## 2022-10-25 LAB — CBC W/ AUTO DIFF
BASOPHILS ABSOLUTE COUNT: 0.1 10*9/L (ref 0.0–0.1)
BASOPHILS ABSOLUTE COUNT: 0.1 10*9/L (ref 0.0–0.1)
BASOPHILS RELATIVE PERCENT: 1.2 %
BASOPHILS RELATIVE PERCENT: 1.8 %
EOSINOPHILS ABSOLUTE COUNT: 0.3 10*9/L (ref 0.0–0.5)
EOSINOPHILS ABSOLUTE COUNT: 0.2 10*9/L (ref 0.0–0.5)
EOSINOPHILS RELATIVE PERCENT: 4.6 %
EOSINOPHILS RELATIVE PERCENT: 3.8 %
HEMATOCRIT: 39.3 % (ref 34.0–44.0)
HEMATOCRIT: 37.8 % (ref 34.0–44.0)
HEMOGLOBIN: 12.9 g/dL (ref 11.3–14.9)
HEMOGLOBIN: 12.4 g/dL (ref 11.3–14.9)
LYMPHOCYTES ABSOLUTE COUNT: 1.7 10*9/L (ref 1.1–3.6)
LYMPHOCYTES ABSOLUTE COUNT: 1.5 10*9/L (ref 1.1–3.6)
LYMPHOCYTES RELATIVE PERCENT: 24.1 %
LYMPHOCYTES RELATIVE PERCENT: 25 %
MEAN CORPUSCULAR HEMOGLOBIN CONC: 32.8 g/dL (ref 32.0–36.0)
MEAN CORPUSCULAR HEMOGLOBIN CONC: 32.9 g/dL (ref 32.0–36.0)
MEAN CORPUSCULAR HEMOGLOBIN: 25.9 pg (ref 25.9–32.4)
MEAN CORPUSCULAR HEMOGLOBIN: 26 pg (ref 25.9–32.4)
MEAN CORPUSCULAR VOLUME: 78.9 fL (ref 77.6–95.7)
MEAN CORPUSCULAR VOLUME: 78.9 fL (ref 77.6–95.7)
MEAN PLATELET VOLUME: 9.9 fL (ref 6.8–10.7)
MEAN PLATELET VOLUME: 9.2 fL (ref 6.8–10.7)
MONOCYTES ABSOLUTE COUNT: 0.8 10*9/L (ref 0.3–0.8)
MONOCYTES ABSOLUTE COUNT: 0.6 10*9/L (ref 0.3–0.8)
MONOCYTES RELATIVE PERCENT: 12 %
MONOCYTES RELATIVE PERCENT: 9.8 %
NEUTROPHILS ABSOLUTE COUNT: 4 10*9/L (ref 1.8–7.8)
NEUTROPHILS ABSOLUTE COUNT: 3.7 10*9/L (ref 1.8–7.8)
NEUTROPHILS RELATIVE PERCENT: 58.1 %
NEUTROPHILS RELATIVE PERCENT: 59.6 %
PLATELET COUNT: 246 10*9/L (ref 150–450)
PLATELET COUNT: 230 10*9/L (ref 150–450)
RED BLOOD CELL COUNT: 4.97 10*12/L (ref 3.95–5.13)
RED BLOOD CELL COUNT: 4.79 10*12/L (ref 3.95–5.13)
RED CELL DISTRIBUTION WIDTH: 14.2 % (ref 12.2–15.2)
RED CELL DISTRIBUTION WIDTH: 14.7 % (ref 12.2–15.2)
WBC ADJUSTED: 6.9 10*9/L (ref 3.6–11.2)
WBC ADJUSTED: 6.2 10*9/L (ref 3.6–11.2)

## 2022-10-25 LAB — COMPREHENSIVE METABOLIC PANEL
ALBUMIN: 3.3 g/dL — ABNORMAL LOW (ref 3.4–5.0)
ALKALINE PHOSPHATASE: 60 U/L (ref 46–116)
ALT (SGPT): 11 U/L (ref 10–49)
ANION GAP: 3 mmol/L — ABNORMAL LOW (ref 5–14)
AST (SGOT): 16 U/L (ref <=34)
BILIRUBIN TOTAL: 0.4 mg/dL (ref 0.3–1.2)
BLOOD UREA NITROGEN: 15 mg/dL (ref 9–23)
BUN / CREAT RATIO: 31
CALCIUM: 8.8 mg/dL (ref 8.7–10.4)
CHLORIDE: 111 mmol/L — ABNORMAL HIGH (ref 98–107)
CO2: 27 mmol/L (ref 20.0–31.0)
CREATININE: 0.48 mg/dL — ABNORMAL LOW
EGFR CKD-EPI (2021) FEMALE: >90 mL/min/{1.73_m2} (ref >=60)
GLUCOSE RANDOM: 101 mg/dL (ref 70–179)
POTASSIUM: 4.3 mmol/L (ref 3.4–4.8)
PROTEIN TOTAL: 6.4 g/dL (ref 5.7–8.2)
SODIUM: 141 mmol/L (ref 135–145)

## 2022-10-25 LAB — SLIDE REVIEW

## 2022-10-25 LAB — PROTIME-INR
INR: 0.92
PROTIME: 10.3 s (ref 9.9–12.6)

## 2022-10-25 LAB — HEMOGLOBIN AND HEMATOCRIT, BLOOD
HEMATOCRIT: 33.7 % — ABNORMAL LOW (ref 34.0–44.0)
HEMOGLOBIN: 11 g/dL — ABNORMAL LOW (ref 11.3–14.9)

## 2022-10-25 LAB — APTT
APTT: 32 s (ref 24.8–38.4)
HEPARIN CORRELATION: 0.2

## 2022-10-25 MED ADMIN — cholecalciferol (vitamin D3 25 mcg (1,000 units)) tablet 25 mcg: 25 ug | ORAL | @ 21:00:00

## 2022-10-25 MED ADMIN — pantoprazole (Protonix) injection 40 mg: 40 mg | INTRAVENOUS | @ 15:00:00

## 2022-10-25 MED ADMIN — atorvastatin (LIPITOR) tablet 10 mg: 10 mg | ORAL | @ 21:00:00

## 2022-10-25 MED ADMIN — tacrolimus (ENVARSUS XR) extended release tablet 2.5 mg: 2.5 mg | ORAL | @ 23:00:00

## 2022-10-25 MED ADMIN — cefTRIAXone (ROCEPHIN) 1 g in sodium chloride 0.9 % (NS) 100 mL IVPB-MBP: 1 g | INTRAVENOUS | @ 15:00:00 | Stop: 2022-10-25

## 2022-10-25 NOTE — Unmapped (Signed)
Pt presents from home for c/o rectal bleeding since 0400 this morning. Denies any BT use or vomiting. Endorses abdominal cramping as well. Hx kidney/liver transplant.

## 2022-10-25 NOTE — Unmapped (Signed)
[facility]  Emergency Department Provider Note    ED Clinical Impression     Final diagnoses:   Bright red blood per rectum (Primary)       HPI, ED Course, Assessment and Plan     Initial Clinical Impression:    October 25, 2022 8:01 AM   Michelle Travis is a 66 y.o. 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  Patient states that at around 4 AM this morning, she awoke and passed a small blood clot via rectum.  Since that time she has passed several blood clots, and there is bright red blood on the tissue when she wipes.  Patient states that she has never had rectal bleeding in the past, denies associated abdominal pain, nausea or vomiting, diarrhea, or other concerns.  Patient notes that she last had a normal bowel movement yesterday.  Patient reports that since her arrival in the emergency department she has continued to have bright red blood per rectum.  Patient denies any recent illnesses, fevers or chills, chest pain, shortness of breath, syncope, or dizziness.  Patient states that she has otherwise been in her usual state of health and has been taking her immunosuppressive medications as prescribed.      BP 124/73  - Pulse 61  - Temp 36.6 ??C (97.9 ??F) (Oral)  - Resp 22  - Ht 160 cm (5' 3)  - Wt 79.3 kg (174 lb 14.3 oz)  - LMP  (LMP Unknown)  - SpO2 97%  - BMI 30.98 kg/m??     Medical Decision Making  This patient presents with symptoms concerning for an acute lower GI bleed.  Differential diagnoses includes diverticulosis versus hemorrhoids versus angiodysplasia versus IBD versus cancer.  Presentation is not consistent with mesenteric ischemia or ischemic colitis at this time.  Patient is without melena, without evidence of hemorrhagic shock though I have considered brisk upper GI bleed as well given patient's history of varices and liver history.  Plan to provide patient with ceftriaxone and twice daily Protonix.  Patient's Glasgow Blatchford Bleeding score is 2 indicating a high risk GI bleed.  Anticipate admission to the hospital at this time.  Will also trend hemoglobin as patient reports continued bleeding here in the department.    Further ED updates and updates to plan as per ED Course below:    ED Course:  ED Course as of 10/25/22 1935   Wed Oct 25, 2022   0927 Rectal exam performed at this time with RN as chaperone with findings of gross blood, Hemoccult positive, without findings of hemorrhoids or fissures.   1036 I have paged the medical admitting officer regarding this patient.   1113 EKG on my interpretation with sinus bradycardia, rate of 57, normal axis, appropriate intervals, LVH, ST depression in leads I, 2, V4 through V6 with deep T wave inversions which appear similar to prior EKG from 18 May 2022   1137 On reassessment, patient reports continued bleeding here in the department, denies any current needs or questions at this time.  Blood pressure remains 135/84, and patient is nontachycardic.   1328 I spoken with an admitting team regarding this patient.  She has a repeat CBC ordered.  She remains hemodynamically stable and in no acute distress.         Independent Interpretation of Studies: I have independently interpreted the following studies:  EKG  Discussion of Management With Other Providers or Support Staff: I discussed the management of this patient with the:  Medical admitting officer, admitting team    Considerations Regarding Disposition/Escalation of Care and Critical Care:  Patient be admitted to the hospital at this time for further management    Social Determinants of Health with Concerns     Internet Connectivity: Not on file   Alcohol Use: Not on file   Substance Use: Not on file   Health Literacy: Not on file   Physical Activity: Not on file   Interpersonal Safety: Not on file   Stress: Not on file   Intimate Partner Violence: Not on file   Social Connections: Not on file     _____________________________________________________________________    The case was discussed with the attending physician who is in agreement with the above assessment and plan    Past History     PAST MEDICAL HISTORY/PAST SURGICAL HISTORY:   Past Medical History:   Diagnosis Date    Acute kidney injury superimposed on chronic kidney disease (CMS-HCC) 05/25/2022    Acute renal failure (ARF) (CMS-HCC)     Acute renal injury (CMS-HCC) 09/24/2015    Anemia 04/21/2015    Ascites due to alcoholic cirrhosis (CMS-HCC)     Atelectasis 08/26/2016    Brain aneurysm     Breast cancer (CMS-HCC)     Cirrhosis, alcoholic (CMS-HCC)     Degenerative arthritis     Diarrhea 05/25/2022    Esophageal varices (CMS-HCC)     H/o ETOH abuse 06/19/2016    Hepatic encephalopathy (CMS-HCC)     History of cholecystectomy 08/25/2016    Hypertension     Leukocytosis 10/31/2016    Lower extremity edema     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    Pleural effusion associated with hepatic disorder 08/26/2016    Portal hypertension (CMS-HCC)     SOB (shortness of breath) 06/19/2016    Stage III chronic kidney disease (CMS-HCC) 07/03/2015       Past Surgical History:   Procedure Laterality Date    BREAST LUMPECTOMY  ~2000    CATARACT EXTRACTION      CEREBRAL ANEURYSM REPAIR      CHOLECYSTECTOMY  2010    PR TRANSPLANT LIVER,ALLOTRANSPLANT N/A 09/13/2017    Procedure: LIVER ALLOTRANSPLANTATION; ORTHOTOPIC, PARTIAL OR WHOLE, FROM CADAVER OR LIVING DONOR, ANY AGE;  Surgeon: Florene Glen, MD;  Location: MAIN OR [place];  Service: Transplant    PR TRANSPLANTATION OF KIDNEY N/A 09/13/2017    Procedure: RENAL ALLOTRANSPLANTATION, IMPLANTATION OF GRAFT; WITHOUT RECIPIENT NEPHRECTOMY;  Surgeon: Florene Glen, MD;  Location: MAIN OR [place];  Service: Transplant    right knee replacement      TIPS PROCEDURE  07/16/2015       MEDICATIONS:     Current Facility-Administered Medications:     atorvastatin (LIPITOR) tablet 10 mg, 10 mg, Oral, Daily, Lari, Kian B, MD, 10 mg at 10/25/22 1600    cholecalciferol (vitamin D3 25 mcg (1,000 units)) tablet 25 mcg, 25 mcg, Oral, Daily, Lari, Kian B, MD, 25 mcg at 10/25/22 1600    mycophenolate (MYFORTIC) EC tablet 360 mg, 360 mg, Oral, BID, Lari, Kian B, MD    pantoprazole (Protonix) injection 40 mg, 40 mg, Intravenous, BID, Ripley Fraise, MD, 40 mg at 10/25/22 0932    peg-electrolyte soln (GoLYTELY) solution 4,000 mL, 4,000 mL, Oral, Once, Festus Holts, Kian B, MD    tacrolimus (ENVARSUS XR) extended release tablet 2.5 mg,  2.5 mg, Oral, Daily, Festus Holts, Kian B, MD, 2.5 mg at 10/25/22 1800    Current Outpatient Medications:     acetaminophen (TYLENOL 8 HOUR) 650 MG CR tablet, Take 1 tablet (650 mg total) by mouth every four (4) hours as needed for pain., Disp: 30 tablet, Rfl: 0    aspirin 81 MG chewable tablet, Chew 1 tablet (81 mg total) daily., Disp: 90 tablet, Rfl: 3    atorvastatin (LIPITOR) 10 MG tablet, Take 1 tablet (10 mg total) by mouth daily., Disp: 90 tablet, Rfl: 3    buprenorphine (BELBUCA) 150 mcg buccal film, Apply 1 Film to cheek at bedtime., Disp: , Rfl:     cholecalciferol, vitamin D3 25 mcg, 1,000 units,, 1,000 unit (25 mcg) tablet, Take 1 tablet (25 mcg total) by mouth in the morning., Disp: 90 tablet, Rfl: 3    ENVARSUS XR 1 mg Tb24 extended release tablet, Take 1 tablet (1 mg total) by mouth in the morning. Take with TWO 0.75 mg tablets (1.5 mg total) by mouth in the morning for a total of 2.5 mg.., Disp: 90 tablet, Rfl: 3    mycophenolate (MYFORTIC) 180 MG EC tablet, Take 2 tablets (360 mg total) by mouth two (2) times a day., Disp: 360 tablet, Rfl: 3    oxyCODONE (ROXICODONE) 10 mg immediate release tablet, TAKE 1 (ONE) TABLET BY MOUTH FOUR TIMES DAILY, AS NEEDED, Disp: , Rfl:     pantoprazole (PROTONIX) 40 MG tablet, Take 1 tablet (40 mg total) by mouth daily., Disp: 90 tablet, Rfl: 3    tacrolimus (ENVARSUS XR) 0.75 mg Tb24 extended release tablet, Take 2 tablets (1.5 mg total) by mouth in the morning. Take with ONE 1 mg tablet by mouth in the morning for a total of 2.5 mg.., Disp: 180 tablet, Rfl: 3    ALLERGIES:   Latex, Hydrocodone, and Hydrocodone-acetaminophen    SOCIAL HISTORY:   Social History     Tobacco Use    Smoking status: Never     Passive exposure: Never    Smokeless tobacco: Never   Substance Use Topics    Alcohol use: No     Alcohol/week: 0.0 standard drinks of alcohol       FAMILY HISTORY:  Family History   Problem Relation Age of Onset    Aneurysm Mother     Alcohol abuse Father     Cirrhosis Father     Alcohol abuse Sister           Review of Systems     A review of systems was performed and relevant portions were as noted above in HPI     Physical Exam     VITAL SIGNS:    BP 124/73  - Pulse 61  - Temp 36.6 ??C (97.9 ??F) (Oral)  - Resp 22  - Ht 160 cm (5' 3)  - Wt 79.3 kg (174 lb 14.3 oz)  - LMP  (LMP Unknown)  - SpO2 97%  - BMI 30.98 kg/m??     Constitutional:   Alert and oriented.  In no acute distress.   Head:   Normocephalic and atraumatic  Eyes:   Conjunctivae are normal, EOMI, PERRL  ENT:   No notable congestion, Mucous membranes moist, External ears normal, no notable stridor  Cardiovascular:   Rate as vitals above. Appears warm and well perfused.  2+ radial pulses bilaterally  Respiratory:   Normal respiratory effort. Breath sounds are normal.  Gastrointestinal:   Soft, non-distended, and nontender.  Genitourinary:   Deferred  Musculoskeletal:    Normal range of motion in all extremities. No tenderness or edema noted in B/L lower extremities  Neurologic:   No gross focal neurologic deficits beyond baseline are appreciated.  Skin:   Skin is warm, dry and intact.       Radiology     No orders to display       Labs     Labs Reviewed   COMPREHENSIVE METABOLIC PANEL - Abnormal; Notable for the following components:       Result Value    Chloride 111 (*)     Anion Gap 3 (*)     Creatinine 0.48 (*)     Albumin 3.3 (*)     All other components within normal limits   CBC W/ AUTO DIFF - Abnormal; Notable for the following components:    Microcytosis Slight (*)     Hypochromasia Moderate (*)     All other components within normal limits    Narrative:     WBC, PLT and/or Differential requires smear review. Expected TAT is 2 hours.   SLIDE REVIEW - Abnormal; Notable for the following components:    Smear Review Comments See Comment (*)     Toxic Vacuolation Present (*)     All other components within normal limits   CBC W/ AUTO DIFF - Abnormal; Notable for the following components:    Microcytosis Slight (*)     Hypochromasia Slight (*)     All other components within normal limits    Narrative:     WBC, PLT and/or Differential requires smear review. Expected TAT is 2 hours.   SLIDE REVIEW - Abnormal; Notable for the following components:    Smear Review Comments See Comment (*)     Burr Cells Present (*)     Hypersegmented Neutrophils Present (*)     Toxic Granulation Present (*)     All other components within normal limits   CBC W/ DIFFERENTIAL    Narrative:     The following orders were created for panel order CBC w/ Differential.                  Procedure                               Abnormality         Status                                     ---------                               -----------         ------                                     CBC w/ Differential[[id number]]         Abnormal            Final result                               Morphology [id number]]  Abnormal            Final result                                                 Please view results for these tests on the individual orders.   PROTIME-INR   APTT   CBC W/ DIFFERENTIAL    Narrative:     The following orders were created for panel order CBC w/ Differential.                  Procedure                               Abnormality         Status                                     --------- -----------         ------                                     CBC w/ [id number]]         Abnormal            Final result                               Morphology [phone]]           Abnormal            Final result                                                 Please view results for these tests on the individual orders.   HEMOGLOBIN AND HEMATOCRIT, BLOOD   TYPE AND SCREEN         Pertinent labs & imaging results that were available during my care of the patient were reviewed by me and considered in my medical decision making (see chart for details).    Please note- This chart has been created using [employer]. Chart creation errors have been sought, but may not always be located and such creation errors, especially pronoun confusion, do NOT reflect on the standard of medical care.       Ripley Fraise, MD  Resident  10/25/22 [phone]

## 2022-10-25 NOTE — Unmapped (Cosign Needed)
Internal Medicine (MEDW) History & Physical    Assessment & Plan:   Michelle Travis is a 66 y.o. female whose presentation is complicated by alcoholic cirrhosis s/p renal/liver transplant in 2018 on mycophenolate and tacrolimus that presented to [facility] with acute bleeding per rectum.     Principal Problem:    Rectal bleed  Active Problems:    Liver replaced by transplant (CMS-HCC)    Kidney transplanted  Resolved Problems:    * No resolved hospital problems. *    Active Problems    Acute hemorrhage per rectum - remote hx diverticulosis   Patient still experiencing bright red bloody bowel movements while in ED rouhgly every 3 hours. Denies dizziness, LOC, or flushing. Ambulating without difficulty. No recent sick contacts and denies diarrhea. On examination mild tenderness to palpation to the left upper quadrant as well as the left lower quadrant. On rectal exam, no hemorrhoids or fissures appreciated.  Vital signs stable.  Hemoglobin 12.9.  Will consult GI for further evaluation and potential colonoscopy.  - Daily CBC  - HH q6 hours   - 2 large bore IV accessed   - IV protonix 40 mg BID   - Consult to GI: appreciate recs   - NPO    History of alcoholic cirrhosis - status post liver-kidney transplant (2018) - immunosuppression   Known history gathered through retrospective chart review. Patient has been stable from a immunosuppressant stand-point. At this current time, low suspicion that patient's transplanted organs are undergoing rejection. Likely non-contributory to bleeding per rectum. Will obtain tacrolimus levels and continue home medications.  - Tacrolimus level, trough  - Home tacrolimus   - Home mycophenolate     Chronic Problems    Osteopenia: home cholecalciferol      Chronic Pain: takes belbuca, though med review showing patient rarely taking. Will hold at this time.      Hypercholesterolemia: home atorvastatin     The patient's presentation is complicated by the following clinically significant conditions requiring additional evaluation and treatment: - Hypotension POA requiring further investigation, treatment, or monitoring     Checklist:  Diet: NPO and Regular Diet  DVT PPx: Contraindicated 2/2 acute bleed  Code Status: Full Code  Dispo: Patient appropriate for floor     Team Contact Information:   Primary Team: Internal Medicine (MEDW)  Primary Resident: Alexander Mt, MD  Resident's Pager: [phone] (Gen MedW Intern - Tower)    Chief Concern:   Rectal bleed    Subjective:   Michelle Travis is a 66 y.o. female with pertinent PMHx of alcoholic cirrhosis s/p liver-kidney transplant in 2018 on immunosuppressants presenting with acute hemorrhage from rectum.      HPI:    Patient presenting to [facility] emergency department today after having experienced several bouts of bloody bowel movements which started around 2 AM this morning.  Continues to have bright red blood per her rectum every 3-4 hours today.  She was worried at the site of this especially given the history of renal/liver transplant and wanted to be evaluated in the emergency department.  She has not noted any hematemesis, dizziness, loss of consciousness, flushing, or shortness of breath since noticing these bowel movements.  Does states she has a history of diverticulosis.  On retrospective chart review it appears her last colonoscopy was several years ago.  Patient denies any recent history of bloody bowel movements, changes in her diet or level activity at home, or medications.  No recent sick contacts.  Otherwise feeling healthy    History obtained from patient, who is thought to be an excellent historian.     Pertinent Surgical Hx  Past Surgical History:   Procedure Laterality Date    BREAST LUMPECTOMY  ~2000    CATARACT EXTRACTION      CEREBRAL ANEURYSM REPAIR      CHOLECYSTECTOMY  2010    PR TRANSPLANT LIVER,ALLOTRANSPLANT N/A 09/13/2017    Procedure: LIVER ALLOTRANSPLANTATION; ORTHOTOPIC, PARTIAL OR WHOLE, FROM CADAVER OR LIVING DONOR, ANY AGE;  Surgeon: Florene Glen, MD;  Location: MAIN OR [place];  Service: Transplant    PR TRANSPLANTATION OF KIDNEY N/A 09/13/2017    Procedure: RENAL ALLOTRANSPLANTATION, IMPLANTATION OF GRAFT; WITHOUT RECIPIENT NEPHRECTOMY;  Surgeon: Florene Glen, MD;  Location: MAIN OR [facility];  Service: Transplant    right knee replacement      TIPS PROCEDURE  07/16/2015     Pertinent Family Hx  Family History   Problem Relation Age of Onset    Aneurysm Mother     Alcohol abuse Father     Cirrhosis Father     Alcohol abuse Sister      Allergies  Latex, Hydrocodone, and Hydrocodone-acetaminophen    I reviewed the Medication List. There is Uncertainty about the medications the patient is currently taking. Will allow for pharmacy to review recent Rx filling.   Prior to Admission medications    Medication Dose, Route, Frequency   acetaminophen (TYLENOL 8 HOUR) 650 MG CR tablet 650 mg, Oral, Every 4 hours PRN   aspirin 81 MG chewable tablet 81 mg, Oral, Daily (standard)   atorvastatin (LIPITOR) 10 MG tablet 10 mg, Oral, Daily (standard)   BELBUCA 300 mcg buccal film 1 each, Buccal, At bedtime   cholecalciferol, vitamin D3 25 mcg, 1,000 units,, 1,000 unit (25 mcg) tablet 25 mcg, Oral, Daily   ENVARSUS XR 1 mg Tb24 extended release tablet 1 mg, Oral, Daily, Take with TWO 0.75 mg tablets (1.5 mg total) by mouth in the morning for a total of 2.5 mg.   mycophenolate (MYFORTIC) 180 MG EC tablet 360 mg, Oral, 2 times a day   oxyCODONE (ROXICODONE) 10 mg immediate release tablet TAKE 1 (ONE) TABLET BY MOUTH FOUR TIMES DAILY, AS NEEDED   pantoprazole (PROTONIX) 40 MG tablet 40 mg, Oral, Daily (standard)   tacrolimus (ENVARSUS XR) 0.75 mg Tb24 extended release tablet 1.5 mg, Oral, Daily, Take with ONE 1 mg tablet by mouth in the morning for a total of 2.5 mg.       Designated Healthcare Decision Maker:  Ms. Sciarretta currently has decisional capacity for healthcare decision-making and is able to designate a surrogate healthcare decision maker.     Objective:   Physical Exam:  Temp:  [36.6 ??C (97.9 ??F)] 36.6 ??C (97.9 ??F)  Heart Rate:  [58-86] 68  Resp:  [15-26] 15  BP: (115-179)/(83-94) 130/84  SpO2:  [95 %-100 %] 95 %    Gen: NAD, converses. Laying comfortably in bed in ED.   Eyes: Sclera anicteric, EOMI grossly normal.   HENT: Atraumatic, normocephalic.   Neck: Trachea midline.   Heart: RRR  Lungs: CTAB, no crackles or wheezes.   Abdomen: Soft. Mild TTP appreciated in LUQ, LLQ   Extremities: No edema.   Neuro: Grossly symmetric, non-focal.    Skin:  No rashes, lesions on clothed exam.  Psych: Alert, oriented .    Oletha Blend, MD  Anesthesiology   PGY-1

## 2022-10-25 NOTE — Unmapped (Signed)
Late entry received on call page from patient @ 5:25 reporting she just went to the bathroom and her liver is coming out - asked for clarification about this and ascertained that she was noticing blood in her stool.  Difficult to quantify amount/consistency.  Does confirm she is noting bright red blood on tissue when she wipes but does not believe she has hemorrhoids.  She notes the entire bowl including water is all red and reports large clots.  She reports she is not dizzy, light headeded etc.  She is concerned given her history of liver/kidney transplant and will be in route to [facility] ED.

## 2022-10-26 LAB — COMPREHENSIVE METABOLIC PANEL
ALBUMIN: 3.6 g/dL (ref 3.4–5.0)
ALKALINE PHOSPHATASE: 61 U/L (ref 46–116)
ALT (SGPT): 12 U/L (ref 10–49)
ANION GAP: 8 mmol/L (ref 5–14)
AST (SGOT): 18 U/L (ref <=34)
BILIRUBIN TOTAL: 0.8 mg/dL (ref 0.3–1.2)
BLOOD UREA NITROGEN: 11 mg/dL (ref 9–23)
BUN / CREAT RATIO: 22
CALCIUM: 9.4 mg/dL (ref 8.7–10.4)
CHLORIDE: 110 mmol/L — ABNORMAL HIGH (ref 98–107)
CO2: 25 mmol/L (ref 20.0–31.0)
CREATININE: 0.51 mg/dL — ABNORMAL LOW
EGFR CKD-EPI (2021) FEMALE: >90 mL/min/{1.73_m2} (ref >=60)
GLUCOSE RANDOM: 115 mg/dL (ref 70–179)
POTASSIUM: 4.3 mmol/L (ref 3.4–4.8)
PROTEIN TOTAL: 6.9 g/dL (ref 5.7–8.2)
SODIUM: 143 mmol/L (ref 135–145)

## 2022-10-26 LAB — PHOSPHORUS: PHOSPHORUS: 3.2 mg/dL (ref 2.4–5.1)

## 2022-10-26 LAB — HEMOGLOBIN AND HEMATOCRIT, BLOOD
HEMATOCRIT: 40.3 % (ref 34.0–44.0)
HEMOGLOBIN: 13.1 g/dL (ref 11.3–14.9)

## 2022-10-26 LAB — CBC
HEMATOCRIT: 40.2 % (ref 34.0–44.0)
HEMOGLOBIN: 13.2 g/dL (ref 11.3–14.9)
MEAN CORPUSCULAR HEMOGLOBIN CONC: 32.7 g/dL (ref 32.0–36.0)
MEAN CORPUSCULAR HEMOGLOBIN: 25.9 pg (ref 25.9–32.4)
MEAN CORPUSCULAR VOLUME: 79.2 fL (ref 77.6–95.7)
MEAN PLATELET VOLUME: 9.5 fL (ref 6.8–10.7)
PLATELET COUNT: 284 10*9/L (ref 150–450)
RED BLOOD CELL COUNT: 5.08 10*12/L (ref 3.95–5.13)
RED CELL DISTRIBUTION WIDTH: 14.9 % (ref 12.2–15.2)
WBC ADJUSTED: 8 10*9/L (ref 3.6–11.2)

## 2022-10-26 LAB — MAGNESIUM: MAGNESIUM: 1.8 mg/dL (ref 1.6–2.6)

## 2022-10-26 LAB — PROTIME-INR
INR: 0.99
PROTIME: 11.1 s (ref 9.9–12.6)

## 2022-10-26 LAB — TACROLIMUS LEVEL, TROUGH: TACROLIMUS, TROUGH: 4.7 ng/mL — ABNORMAL LOW (ref 5.0–15.0)

## 2022-10-26 LAB — SPECIMEN STATUS REPORT

## 2022-10-26 MED ADMIN — propofol (DIPRIVAN) infusion 10 mg/mL: INTRAVENOUS | @ 21:00:00 | Stop: 2022-10-26

## 2022-10-26 MED ADMIN — tacrolimus (ENVARSUS XR) extended release tablet 2.5 mg: 2.5 mg | ORAL | @ 14:00:00

## 2022-10-26 MED ADMIN — pantoprazole (Protonix) injection 40 mg: 40 mg | INTRAVENOUS | @ 03:00:00

## 2022-10-26 MED ADMIN — pantoprazole (Protonix) injection 40 mg: 40 mg | INTRAVENOUS | @ 14:00:00

## 2022-10-26 MED ADMIN — lactated Ringers infusion: 10 mL/h | INTRAVENOUS | @ 19:00:00

## 2022-10-26 MED ADMIN — Propofol (DIPRIVAN) injection: INTRAVENOUS | @ 21:00:00 | Stop: 2022-10-26

## 2022-10-26 MED ADMIN — atorvastatin (LIPITOR) tablet 10 mg: 10 mg | ORAL | @ 14:00:00

## 2022-10-26 MED ADMIN — cholecalciferol (vitamin D3 25 mcg (1,000 units)) tablet 25 mcg: 25 ug | ORAL | @ 14:00:00

## 2022-10-26 MED ADMIN — mycophenolate (MYFORTIC) EC tablet 360 mg: 360 mg | ORAL | @ 01:00:00

## 2022-10-26 MED ADMIN — peg-electrolyte soln (GoLYTELY) solution 4,000 mL: 4000 mL | ORAL | @ 03:00:00 | Stop: 2022-10-25

## 2022-10-26 MED ADMIN — mycophenolate (MYFORTIC) EC tablet 360 mg: 360 mg | ORAL | @ 14:00:00

## 2022-10-26 MED ADMIN — lidocaine (XYLOCAINE) 20 mg/mL (2 %) injection: INTRAVENOUS | @ 21:00:00 | Stop: 2022-10-26

## 2022-10-26 NOTE — Unmapped (Cosign Needed)
Gastroenterology (Luminal) Consult Service   Initial Consultation         Assessment and Recommendations:   Michelle Travis is a 66 y.o. 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 The patient is seen in consultation at the request of De-Vaughn Sima Matas, MD (Med [facility] (MDW)) for hematochezia. DRE with maroon blood suspicious for a more proximal source such as AVMs, ulcer, diverticular, ischemia rather than a more distal etiology such as hemorrhoids or fissure. Lower suspicion for upper source given hemodynamic stability and lack of upper symptoms though her BUN/Cr ratio is elevated. Will pursue initial evaluation with a colonoscopy.     - Maintain adequate IV access with at least 2 large-bore peripheral IVs (16G or 18G) or large bore central line (i.e. 7Fr Cordis)  - Active Type and Screen, Trend H/H q6-8 hours and transfuse for Hgb < 7  - Hold all NSAIDs, antiplatelet agents and anticoagulants.   - Please prep patient for colonoscopy with 4L Golytely  - NPO @ midnight for colonoscopy (aside from Golytely prep). Please ensure stool is clear/yellow in early AM. If not, please page luminal fellow re further guidance. No red/purple liquids.    GI Pre-Procedure Checklist  Procedure: Colonoscopy  Anticipated Date of Procedure: Thurs 12/27   Anticoagulants/Antiplatelets: Not Applicable  Anesthesia Concerns: None  Diet: Order clear liquid and make NPO at 12AM (midnight) day of procedure  Prep: Give 4L Golytely bowel prep this afternoon. Order bowel prep using MED GI PROCEDURES PREP order-set. You must select the appropriate prep, this is not auto-selected. If Golytely is not available from pharmacy, please give Gatorade+Miralax prep. The patient is adequately prepped when their stool is clear and yellow, like urine. Continue to order bowel prep until the patients stools are clear. The patient can continue drinking prep past midnight if not clear, but must be strictly NPO of all oral intake for at least 2 hours before procedure.    Issues Impacting Complexity of Management:  -None    Recommendations discussed with the patient's primary team. We will continue to follow along with you.    For questions, contact the on-call fellow for the Gastroenterology (Luminal) Consult Service.    Subjective:   Patient reports she awoke from sleep at 4 am to use the bathroom with passage of maroon colored blood without stool. Had an additional 3 episodes at home and decided to seek evaluation as she was concerned given her transplant hx. She had another 4 episodes of hematochezia upon arrival to [facility] ED but overall episodes seem to be slowing down in volume. She also reports some mild LLQ pain. Thinks she may have had diverticulitis in the past but is unsure of the details. She denies nausea, vomiting, reflux. Her last colonoscopy was in 2019 at [facility] in [place], advised to repeat in 2024 though documentation by her hepatologist Dr. Sherryll Burger indicates one small adenoma with next screening in 7 yrs.     On arrival HDS with HR upper 40s-60s, initial Hgb 12.9. Given pantoprazole 40 IV x 1 and ceftriaxone.         Objective:   Temp:  [36.6 ??C (97.9 ??F)] 36.6 ??C (97.9 ??F)  Heart Rate:  [51-86] 61  SpO2 Pulse:  [48-63] 54  Resp:  [9-26] 22  BP: (115-179)/(73-94) 124/73  SpO2:  [94 %-100 %] 97 %    Gen: WDWN female in NAD, answers questions appropriately  Abdomen: Soft, NTND, no rebound/guarding, no hepatosplenomegaly. DRE with no stool and maroon blood without clots in rectal vault, small skin tag, no hemorrhoids  Extremities: No edema in the BLEs    Pertinent Labs/Studies:  -I have reviewed the patient's labs from 12/27 which show down-trending Hgb

## 2022-10-26 NOTE — Unmapped (Signed)
Patient is resting in bed with normal vitals and respirations. Patient is prepping with go-lytely.  No pain reported. Plan of care is ongoing.    Problem: Adult Inpatient Plan of Care  Goal: Plan of Care Review  Outcome: Progressing  Goal: Patient-Specific Goal (Individualized)  Outcome: Progressing  Goal: Absence of Hospital-Acquired Illness or Injury  Outcome: Progressing  Intervention: Identify and Manage Fall Risk  Recent Flowsheet Documentation  Taken 10/26/2022 0000 by Clydie Braun, RN  Safety Interventions: fall reduction program maintained  Taken 10/25/2022 2200 by Clydie Braun, RN  Safety Interventions: fall reduction program maintained  Intervention: Prevent Skin Injury  Recent Flowsheet Documentation  Taken 10/25/2022 2200 by Clydie Braun, RN  Positioning for Skin: Supine/Back  Device Skin Pressure Protection: absorbent pad utilized/changed  Skin Protection: adhesive use limited  Goal: Optimal Comfort and Wellbeing  Outcome: Progressing  Goal: Readiness for Transition of Care  Outcome: Progressing  Goal: Rounds/Family Conference  Outcome: Progressing     Problem: Fall Injury Risk  Goal: Absence of Fall and Fall-Related Injury  Outcome: Progressing  Intervention: Promote Injury-Free Environment  Recent Flowsheet Documentation  Taken 10/26/2022 0000 by Clydie Braun, RN  Safety Interventions: fall reduction program maintained  Taken 10/25/2022 2200 by Clydie Braun, RN  Safety Interventions: fall reduction program maintained     Problem: Gastrointestinal Bleeding  Goal: Optimal Coping with Acute Illness  Outcome: Progressing  Goal: Hemostasis  Outcome: Progressing     Problem: Kidney Transplant  Goal: Effective Renal Function  Outcome: Progressing  Goal: Absence of Infection Signs and Symptoms  Outcome: Progressing     Problem: Latex Allergy  Goal: Absence of Allergy Symptoms  Outcome: Progressing

## 2022-10-26 NOTE — Unmapped (Addendum)
66 yo F PMH alcoholic cirrhosis s/p liver-kidney transplant in 2018 on immunosuppression with mycophenolate and tacrolimus px with acute bleed per rectum.     Hospital course described below, by problem:     Acute hemorrhage per rectum - remote hx diverticulosis   Patient still experiencing bright red bloody bowel movements while in ED rouhgly every 3 hours. Denies dizziness, LOC, or flushing. Ambulating without difficulty. No recent sick contacts and denies diarrhea. On examination mild tenderness to palpation to the left upper quadrant as well as the left lower quadrant. On rectal exam, no hemorrhoids or fissures appreciated. She was strted on IV protonix for GI bleed ppx. Vital signs and hemoglobin remained stable while inpatient. The patient was kept NPO and completed bowel prep and colonoscopy was preformed on 10/26/22 to evaluate source of bleeding. Results of the colonoscopy showed Diverticulosis in the sigmoid colon,  hepatic flexure, and ascending colon with normal ileum. There were non-bleeding internal hemorrhoids. Bleeding source likely either diverticular or from her hemorrhoids or small bowel AVM. Diet was advanced as tolerated and the patient was discharged home in stable condition as evidence by her vital signs, lack of bleeding or additional symptoms, and CBC.      History of alcoholic cirrhosis - status post liver-kidney transplant (2018) - immunosuppression   Known history gathered through retrospective chart review. Patient has been stable from a immunosuppressant stand-point. At this current time, low suspicion that patient's transplanted organs are undergoing rejection. Likely non-contributory to bleeding per rectum, and colonoscopy preformed on 10/26/22 indicate that likely bleeding source either diverticular, from internal hemorrhoids, or small bowel AVMs. Tacrolimus levels were monitored while inpatient and the patient's immunosuppressants were continued throughout her stay.     Chronic Problems   Osteopenia: home cholecalciferol was continued     Chronic Pain: takes belbuca, though med review showing patient rarely taking. This medication was held while inpatient      Hypercholesterolemia: home atorvastatin was continued

## 2022-10-26 NOTE — Unmapped (Signed)
Care Management  Initial Transition Planning Assessment              General  Care Manager assessed the patient by : In person interview with patient  Orientation Level: Oriented X4  Functional level prior to admission: Independent  Reason for referral: Discharge Planning    Contact/Decision Maker  Extended Emergency Contact Information  Primary Emergency Contact: Livia Snellen [place]  Mobile Phone: [phone]  Relation: Daughter  Preferred language: ENGLISH  Interpreter needed? No  Secondary Emergency Contact: Galloway,Randel   [place] [employer] Phone: [phone]  Relation: Brother  Preferred language: ENGLISH  Interpreter needed? No    Legal Next of Kin / Guardian / POA / Advance Directives     HCDM (With Legal Document To Support): Gracye, Ary - Daughter - [phone]    Advance Directive (Medical Treatment)  Does patient have an advance directive covering medical treatment?: Patient has advance directive covering medical treatment, copy not in chart.  Advance directive covering medical treatment not in Chart:: Copy requested from family    Health Care Decision Maker [HCDM] (Medical & Mental Health Treatment)  Healthcare Decision Maker: HCDM documented in the HCDM/Contact Info section.  Information offered on HCDM, Medical & Mental Health advance directives:: Patient declined information.    Advance Directive (Mental Health Treatment)  Does patient have an advance directive covering mental health treatment?: Patient does not have advance directive covering mental health treatment.  Advance directive covering mental health treatment not in Chart:: Copy requested from family.  Reason patient does not have an advance directive covering mental health treatment:: HCDM documented in the HCDM/Contact Info section.    Readmission Information    Have you been hospitalized in the last 30 days?: No          Did the following happen with your discharge?           Patient Information  Lives with: Family members    Type of Residence: Private residence        Location/Detail: [address]   [place] [address]    Support Systems/Concerns: Family Members    Responsibilities/Dependents at home?: No    Home Care services in place prior to admission?: No          Equipment Currently Used at Home: cane, straight (States she has everything but only use cane sometimes.)       Currently receiving outpatient dialysis?: No       Financial Information       Need for financial assistance?: No       Social Determinants of Health  Social Determinants of Health     Financial Resource Strain: Low Risk  (10/25/2022)    Overall Financial Resource Strain (CARDIA)     Difficulty of Paying Living Expenses: Not very hard   Internet Connectivity: No Internet connectivity concern identified (10/25/2022)    Internet Connectivity     Do you have access to internet services: Yes     How do you connect to the internet: Personal Device at home     Is your internet connection strong enough for you to watch video on your device without major problems?: Yes     Do you have enough data to get through the month?: Yes     Does at least one of the devices have a camera that you can use for video chat?: Yes   Food Insecurity: No Food Insecurity (10/25/2022)  Hunger Vital Sign     Worried About Running Out of Food in the Last Year: Never true     Ran Out of Food in the Last Year: Never true   Tobacco Use: Low Risk  (10/25/2022)    Patient History     Smoking Tobacco Use: Never     Smokeless Tobacco Use: Never     Passive Exposure: Never   Housing/Utilities: Low Risk  (10/25/2022)    Housing/Utilities     Within the past 12 months, have you ever stayed: outside, in a car, in a tent, in an overnight shelter, or temporarily in someone else's home (i.e. couch-surfing)?: No     Are you worried about losing your housing?: No     Within the past 12 months, have you been unable to get utilities (heat, electricity) when it was really needed?: No   Alcohol Use: Not At Risk (10/25/2022)    Alcohol Use     How often do you have a drink containing alcohol?: Never     How many drinks containing alcohol do you have on a typical day when you are drinking?: Not on file     How often do you have 5 or more drinks on one occasion?: Never   Transportation Needs: No Transportation Needs (10/25/2022)    PRAPARE - Transportation     Lack of Transportation (Medical): No     Lack of Transportation (Non-Medical): No   Substance Use: Low Risk  (10/25/2022)    Substance Use     Taken prescription drugs for non-medical reasons: Never     Taken illegal drugs: Never     Patient indicated they have taken drugs in the past year for non-medical reasons: Yes, [positive answer(s)]: Not on file   Health Literacy: Not on file   Physical Activity: Not on file   Interpersonal Safety: Not on file   Stress: Not on file   Intimate Partner Violence: Not on file   Depression: Not at risk (02/24/2021)    PHQ-2     PHQ-2 Score: 0   Social Connections: Not on file       Complex Discharge Information    Is patient identified as a difficult/complex discharge?: No       Interventions:       Discharge Needs Assessment  Concerns to be Addressed: no discharge needs identified    Clinical Risk Factors: > 65, New Diagnosis    Barriers to taking medications: No    Prior overnight hospital stay or ED visit in last 90 days: No     Equipment Needed After Discharge: none    Discharge Facility/Level of Care Needs:      Readmission  Risk of Unplanned Readmission Score: UNPLANNED READMISSION SCORE: 9.55%  Predictive Model Details          10% (Low)  Factor Value    Calculated 10/25/2022 20:09 19% Number of ED visits in last six months 2    [facility] Risk of Unplanned Readmission Model 16% ECG/EKG order present in last 6 months     15% Number of active inpatient medication orders 12     12% Imaging order present in last 6 months     9% Age 66     9% Number of hospitalizations in last year 1     8% Charlson Comorbidity Index 4     7% Diagnosis of renal failure present     3% Future appointment scheduled     2%  Active ulcer inpatient medication order present     0% Current length of stay 0.26 days      Readmitted Within the Last 30 Days? (No if blank)   Patient at risk for readmission?: No    Discharge Plan       Expected Discharge Date:     Expected Transfer from Critical Care:         Initial Assessment complete?: Yes

## 2022-10-26 NOTE — Unmapped (Signed)
Tacrolimus Therapeutic Monitoring Pharmacy Note    Michelle Travis is a 66 y.o. female continuing tacrolimus.     Indication: Kidney transplant     Date of Transplant:  09/13/17       Prior Dosing Information: Home regimen Envarsus 2.5 mg daily      Source(s) of information used to determine prior to admission dosing: Patient/Caregiver, Home Medication List, or Clinic Note    Goals:  Therapeutic Drug Levels  Tacrolimus trough goal:  3-6 ng/mL    Additional Clinical Monitoring/Outcomes  Monitor renal function (SCr and urine output) and liver function (LFTs)  Monitor for signs/symptoms of adverse events (e.g., hyperglycemia, hyperkalemia, hypomagnesemia, hypertension, headache, tremor)    Results:   Tacrolimus level: Not applicable    Pharmacokinetic Considerations and Significant Drug Interactions:  Concurrent hepatotoxic medications: None identified  Concurrent CYP3A4 substrates/inhibitors: None identified  Concurrent nephrotoxic medications: None identified    Assessment/Plan:  Recommendedation(s)  Continue current regimen of Envarsus 2.5 mg daily    Follow-up  Daily levels .   A pharmacist will continue to monitor and recommend levels as appropriate    Please page service pharmacist with questions/clarifications.    Deanna Artis, PharmD

## 2022-10-26 NOTE — Unmapped (Signed)
Tacrolimus Therapeutic Monitoring Pharmacy Note    Michelle Travis is a 66 y.o. female continuing tacrolimus.     Indication:  kidney and liver transplant      Date of Transplant:  09/13/2017       Prior Dosing Information: Current regimen Envarsus 2.5 mg daily       Source(s) of information used to determine prior to admission dosing: Patient/Caregiver, Home Medication List, or Clinic Note    Goals:  Therapeutic Drug Levels  Tacrolimus trough goal: 3-6 ng/mL    Additional Clinical Monitoring/Outcomes  Monitor renal function (SCr and urine output) and liver function (LFTs)  Monitor for signs/symptoms of adverse events (e.g., hyperglycemia, hyperkalemia, hypomagnesemia, hypertension, headache, tremor)    Results:   Tacrolimus level:  in process, however drawn AFTER the dose was given this AM. Additionally, dose given late in day yesterday so not a true 24 hr level    Pharmacokinetic Considerations and Significant Drug Interactions:  Concurrent hepatotoxic medications: None identified  Concurrent CYP3A4 substrates/inhibitors: None identified  Concurrent nephrotoxic medications: None identified    Assessment/Plan:  Recommendedation(s)  Continue current regimen of Envarsus 2.5 mg daily and will attempt to check true trough levels    Follow-up  Next level has been ordered on 10/27/22 at 0600- currently set as daily. Can likely space out once get a true trough concentration .   A pharmacist will continue to monitor and recommend levels as appropriate    Please page service pharmacist with questions/clarifications.    Phoebe Perch, PharmD, PharmD

## 2022-10-27 LAB — CBC
HEMATOCRIT: 37.4 % (ref 34.0–44.0)
HEMOGLOBIN: 12.2 g/dL (ref 11.3–14.9)
MEAN CORPUSCULAR HEMOGLOBIN CONC: 32.5 g/dL (ref 32.0–36.0)
MEAN CORPUSCULAR HEMOGLOBIN: 25.8 pg — ABNORMAL LOW (ref 25.9–32.4)
MEAN CORPUSCULAR VOLUME: 79.2 fL (ref 77.6–95.7)
MEAN PLATELET VOLUME: 9.7 fL (ref 6.8–10.7)
PLATELET COUNT: 271 10*9/L (ref 150–450)
RED BLOOD CELL COUNT: 4.73 10*12/L (ref 3.95–5.13)
RED CELL DISTRIBUTION WIDTH: 15.1 % (ref 12.2–15.2)
WBC ADJUSTED: 6.3 10*9/L (ref 3.6–11.2)

## 2022-10-27 LAB — TACROLIMUS LEVEL, TROUGH: TACROLIMUS, TROUGH: 4.4 ng/mL — ABNORMAL LOW (ref 5.0–15.0)

## 2022-10-27 MED ORDER — TACROLIMUS XR 0.75 MG TABLET,EXTENDED RELEASE 24 HR
ORAL_TABLET | Freq: Every day | ORAL | 0 refills | 30 days | Status: CN
Start: 2022-10-27 — End: 2022-11-26

## 2022-10-27 MED ADMIN — mycophenolate (MYFORTIC) EC tablet 360 mg: 360 mg | ORAL | @ 14:00:00 | Stop: 2022-10-27

## 2022-10-27 MED ADMIN — pantoprazole (Protonix) injection 40 mg: 40 mg | INTRAVENOUS | @ 01:00:00

## 2022-10-27 MED ADMIN — mycophenolate (MYFORTIC) EC tablet 360 mg: 360 mg | ORAL | @ 01:00:00

## 2022-10-27 MED ADMIN — pantoprazole (Protonix) injection 40 mg: 40 mg | INTRAVENOUS | @ 14:00:00 | Stop: 2022-10-27

## 2022-10-27 MED ADMIN — atorvastatin (LIPITOR) tablet 10 mg: 10 mg | ORAL | @ 14:00:00 | Stop: 2022-10-27

## 2022-10-27 MED ADMIN — cholecalciferol (vitamin D3 25 mcg (1,000 units)) tablet 25 mcg: 25 ug | ORAL | @ 14:00:00 | Stop: 2022-10-27

## 2022-10-27 MED ADMIN — tacrolimus (ENVARSUS XR) extended release tablet 2.5 mg: 2.5 mg | ORAL | @ 14:00:00 | Stop: 2022-10-27

## 2022-10-27 NOTE — Unmapped (Deleted)
Gastroenterology (Luminal) Consult Service   Treatment Plan         Recommendations:   Michelle Travis is a 66 y.o. 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    DRE with maroon blood suspicious for a more proximal source such as AVMs, ulcer, diverticular, ischemia rather than a more distal etiology such as hemorrhoids or fissure. Hb has been stable over the past 24 hours.     She is now s/p Colonoscopy 10/26/22 with the following results:  Impression:            - Preparation of the colon was fair.                         - Diverticulosis in the sigmoid colon, at the hepatic                          flexure and in the ascending colon.                         - Stool in the sigmoid colon and in the descending                          colon.                         - The examined portion of the ileum was normal.                         - Non-bleeding internal hemorrhoids.                         - No specimens collected.    Bleeding source likely either diverticular or from her hemorrhoids or small bowel AVM.    Recommendations:  - Routine follow up with Hepatology, we will try to get sooner appointment  - Can refer to hemorrhoid clinic if persistent symptoms.  - If blood counts remain stable she is not having clinically significant GI bleeding  - Can otherwise continue home PPI    Thank you for involving [place] in the care of your patient. We will sign-off at this time, please re-contact if additional questions or a new need for consultation arises.     For questions, contact the on-call fellow for the Gastroenterology (Luminal) Consult Service.

## 2022-10-27 NOTE — Unmapped (Signed)
Hospital follow up appointment has been scheduled with your  PCP Dr. Benetta Spar, on 11/03/22 @ 10:45 AM  Phone [phone]  / Fax [phone]

## 2022-10-27 NOTE — Unmapped (Signed)
Gastroenterology (Luminal) Consult Service   Progress Note         Assessment and Recommendations:   Michelle Travis is a 66 y.o. 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     DRE with maroon blood suspicious for a more proximal source such as AVMs, ulcer, diverticular, ischemia rather than a more distal etiology such as hemorrhoids or fissure. Hb has been stable over the past 24 hours.      She is now s/p Colonoscopy 10/26/22 with the following results:  Impression:            - Preparation of the colon was fair.                         - Diverticulosis in the sigmoid colon, at the hepatic                          flexure and in the ascending colon.                         - Stool in the sigmoid colon and in the descending                          colon.                         - The examined portion of the ileum was normal.                         - Non-bleeding internal hemorrhoids.                         - No specimens collected.     Bleeding source likely either diverticular or from her hemorrhoids or small bowel AVM.     Recommendations:  - Routine follow up with Hepatology, we will try to get sooner appointment  - Can refer to hemorrhoid clinic if persistent symptoms.       Thank you for involving [place] in the care of your patient. We will sign-off at this time, please re-contact if additional questions or a new need for consultation arises.      For questions, contact the on-call fellow for the Gastroenterology (Luminal) Consult Service.

## 2022-10-27 NOTE — Unmapped (Cosign Needed)
Internal Medicine (MEDW) Progress Note    Assessment & Plan:   Michelle Travis is a 66 y.o. female whose presentation is complicated by alcoholic cirrhosis s/p renal/liver transplant in 2018 on mycophenolate and tacrolimus that presented to [facility] with acute bleeding per rectum. Colonoscopy 12/28 without active source of bleeding, presumed likely either diverticular vs from her hemorrhoids vs small bowel AVM. Cleared for discharge from GI perspective. Patient unable to obtain ride home this evening, will plan for early D.c     Principal Problem:    Rectal bleed  Active Problems:    Liver replaced by transplant (CMS-HCC)    Kidney transplanted  Resolved Problems:    * No resolved hospital problems. *    Acute hemorrhage per rectum - remote hx diverticulosis   Patient still experiencing bright red bloody bowel movements while in ED rouhgly every 3 hours. Denies dizziness, LOC, or flushing. Ambulating without difficulty. No recent sick contacts and denies diarrhea. On examination mild tenderness to palpation to the left upper quadrant as well as the left lower quadrant. On rectal exam, no hemorrhoids or fissures appreciated.  Vital signs and hemoglobin remains stable. Colonoscopy 12/28 without active source of bleeding, presumed likely either diverticular vs from her hemorrhoids ovs small bowel AVM. Cleared for discharge from GI perspective. Patient unable to obtain ride home this evening, will plan for early D.c   - GI place outpatient referral   - Daily CBC  - 2 large bore IV accessed   - IV protonix 40 mg BID        History of alcoholic cirrhosis - status post liver-kidney transplant (2018) - immunosuppression   Known history gathered through retrospective chart review. Patient has been stable from a immunosuppressant stand-point. At this current time, low suspicion that patient's transplanted organs are undergoing rejection. Likely non-contributory to bleeding per rectum. Will obtain tacrolimus levels and continue home medications.  - Tacrolimus level, trough  - Home tacrolimus   - Home mycophenolate      Chronic Problems     Osteopenia: home cholecalciferol      Chronic Pain: takes belbuca, though med review showing patient rarely taking. Will continue to hold      Hypercholesterolemia: home atorvastatin      The patient's presentation is complicated by the following clinically significant conditions requiring additional evaluation and treatment: - Hypotension POA requiring further investigation, treatment, or monitoring      Daily Checklist:  Diet: Regular Diet  DVT PPx: Contraindicated - High Risk for Bleeding/Active Bleeding  Electrolytes: No Repletion Needed  Code Status: Full Code  Dispo: Home    Team Contact Information:   Primary Team: Internal Medicine (MEDW)  Primary Resident: Daine Floras, MD, MD  Resident's Pager: [phone] (Gen MedW Intern - Tower)    Interval History:   No acute events overnight. Denies any concerns today.  Asking to discharge from the hospital today.         Objective:   Temp:  [36.4 ??C (97.5 ??F)-36.8 ??C (98.2 ??F)] 36.6 ??C (97.9 ??F)  Heart Rate:  [62-133] 73  Resp:  [11-25] 19  BP: (98-150)/(60-98) 136/98  SpO2:  [93 %-100 %] 98 %    Gen: NAD, converses appropriately   HENT: atraumatic, normocephalic  Heart: RRR, skin warm and well perfused  Lungs: CTAB, no crackles or wheezes  Abdomen: soft, NTND  Extremities: No edema

## 2022-10-27 NOTE — Unmapped (Signed)
Physician Discharge Summary [facility]  1 [facility] OBSERVATION [facility]  [address]  [place] [address]  Dept: [phone]  Loc: [phone]     Identifying Information:   Michelle Travis  [birth year]  [id number]    Primary Care Physician: Benetta Spar, MD     Code Status: Full Code    Admit Date: 10/25/2022    Discharge Date: 10/27/2022     Discharge To: Home    Discharge Service: [facility] - General Medicine Floor Team (MED W Milinda Antis)     Discharge Attending Physician: No att. 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 *      Hospital Course:   66 yo F PMH alcoholic cirrhosis s/p liver-kidney transplant in 2018 on immunosuppression with mycophenolate and tacrolimus px with acute bleed per rectum.     Hospital course described below, by problem:     Acute hemorrhage per rectum - remote hx diverticulosis   Patient still experiencing bright red bloody bowel movements while in ED rouhgly every 3 hours. Denies dizziness, LOC, or flushing. Ambulating without difficulty. No recent sick contacts and denies diarrhea. On examination mild tenderness to palpation to the left upper quadrant as well as the left lower quadrant. On rectal exam, no hemorrhoids or fissures appreciated. She was strted on IV protonix for GI bleed ppx. Vital signs and hemoglobin remained stable while inpatient. The patient was kept NPO and completed bowel prep and colonoscopy was preformed on 10/26/22 to evaluate source of bleeding. Results of the colonoscopy showed Diverticulosis in the sigmoid colon,  hepatic flexure, and ascending colon with normal ileum. There were non-bleeding internal hemorrhoids. Bleeding source likely either diverticular or from her hemorrhoids or small bowel AVM. Diet was advanced as tolerated and the patient was discharged home in stable condition as evidence by her vital signs, lack of bleeding or additional symptoms, and CBC.      History of alcoholic cirrhosis - status post liver-kidney transplant (2018) - immunosuppression   Known history gathered through retrospective chart review. Patient has been stable from a immunosuppressant stand-point. At this current time, low suspicion that patient's transplanted organs are undergoing rejection. Likely non-contributory to bleeding per rectum, and colonoscopy preformed on 10/26/22 indicate that likely bleeding source either diverticular, from internal hemorrhoids, or small bowel AVMs. Tacrolimus levels were monitored while inpatient and the patient's immunosuppressants were continued throughout her stay.     Chronic Problems   Osteopenia: home cholecalciferol was continued     Chronic Pain: takes belbuca, though med review showing patient rarely taking. This medication was held while inpatient      Hypercholesterolemia: home atorvastatin was continued   The patient's hospital stay has been complicated by the following clinically significant conditions requiring additional evaluation and treatment or having a significant effect of this patient's care: - Anemia POA requiring further investigation or monitoring          Outpatient Provider Follow Up Issues:   [ ]  Ensure patient is no longer experiencing rectal bleeding  [ ]  Ensure patient has followed up with GI clinic      Procedures:  colonoscopy  ______________________________________________________________________  Discharge Medications:      Your Medication List        STOP taking these medications      aspirin 81 MG chewable tablet            CONTINUE taking these medications      acetaminophen 650 MG CR tablet  Commonly known as: TYLENOL 8 HOUR  Take 1 tablet (650 mg total) by mouth every four (4) hours as needed for pain.     atorvastatin 10 MG tablet  Commonly known as: LIPITOR  Take 1 tablet (10 mg total) by mouth daily.     buprenorphine 150 mcg buccal film  Commonly known as: BELBUCA  Apply 1 Film to cheek at bedtime.     cholecalciferol (vitamin D3 25 mcg (1,000 units)) 1,000 unit (25 mcg) tablet  Take 1 tablet (25 mcg total) by mouth in the morning.     mycophenolate 180 MG EC tablet  Commonly known as: MYFORTIC  Take 2 tablets (360 mg total) by mouth two (2) times a day.     oxyCODONE 10 mg immediate release tablet  Commonly known as: ROXICODONE  TAKE 1 (ONE) TABLET BY MOUTH FOUR TIMES DAILY, AS NEEDED     pantoprazole 40 MG tablet  Commonly known as: Protonix  Take 1 tablet (40 mg total) by mouth daily.     tacrolimus 0.75 mg Tb24 extended release tablet  Commonly known as: ENVARSUS XR  Take 2 tablets (1.5 mg total) by mouth in the morning. Take with ONE 1 mg tablet by mouth in the morning for a total of 2.5 mg..     ENVARSUS XR 1 mg Tb24 extended release tablet  Generic drug: tacrolimus  Take 1 tablet (1 mg total) by mouth in the morning. Take with TWO 0.75 mg tablets (1.5 mg total) by mouth in the morning for a total of 2.5 mg..              Allergies:  Latex, Hydrocodone, and Hydrocodone-acetaminophen  ______________________________________________________________________  Pending Test Results:      Most Recent Labs:  All lab results last 24 hours -   Recent Results (from the past 24 hour(s))   Tacrolimus Level, Trough    Collection Time: 10/27/22  3:59 AM   Result Value Ref Range    Tacrolimus, Trough 4.4 (L) 5.0 - 15.0 ng/mL   CBC    Collection Time: 10/27/22  3:59 AM   Result Value Ref Range    WBC 6.3 3.6 - 11.2 10*9/L    RBC 4.73 3.95 - 5.13 10*12/L    HGB 12.2 11.3 - 14.9 g/dL    HCT 16.1 09.6 - 04.5 %    MCV 79.2 77.6 - 95.7 fL    MCH 25.8 (L) 25.9 - 32.4 pg    MCHC 32.5 32.0 - 36.0 g/dL    RDW 40.9 81.1 - 91.4 %    MPV 9.7 6.8 - 10.7 fL    Platelet 271 150 - 450 10*9/L       Relevant Studies/Radiology:  Colonoscopy    Result Date: 10/26/2022  _______________________________________________________________________________ Patient Name: Michelle Travis         Procedure Date: 10/26/2022 3:29 PM MRN: [id number]                     Date of Birth: [birth year] Admit Type: Inpatient                 Age:  66 Room: GI [facility] OR [facility] [employer]          Gender: Female Note Status: Finalized                Instrument Name: [id number] _______________________________________________________________________________  Procedure:             Colonoscopy Indications:           Hematochezia Providers:             Hebert Soho, MD, Amedeo Plenty, RN, Dicky Doe Referring MD:          Medicines:             Propofol per Anesthesia Complications:         No immediate complications. _______________________________________________________________________________ Procedure:             Pre-Anesthesia Assessment:                        - Prior to the procedure, a History and Physical was                        performed, and patient medications and allergies were                        reviewed. The patient's tolerance of previous                        anesthesia was also reviewed. The risks and benefits                        of the procedure and the sedation options and risks                        were discussed with the patient. All questions were                        answered, and informed consent was obtained. Prior                        Anticoagulants: The patient has taken no anticoagulant                        or antiplatelet agents. ASA Grade Assessment: III - A                        patient with severe systemic disease. After reviewing                        the risks and benefits, the patient was deemed in                        satisfactory condition to undergo the procedure.                        After obtaining informed consent, the scope was passed                        under direct vision. Throughout the procedure, the  patient's blood pressure, pulse, and oxygen                        saturations were monitored continuously. The                        Colonoscope was introduced through the anus and                        advanced to the the terminal ileum. The terminal                        ileum, ileocecal valve, appendiceal orifice, and                        rectum were photographed. The quality of the bowel                        preparation was fair. The colonoscopy was performed                        without difficulty. The patient tolerated the                        procedure well.                                                                                Findings:      Multiple large-mouthed diverticula were found in the sigmoid colon,      hepatic flexure and ascending colon. No active bleeding noted though      there was scant old blood in sigmoid colon.      A moderate amount of solid stool was found in the sigmoid colon and in      the descending colon, interfering with visualization.      The terminal ileum appeared normal.      Non-bleeding internal hemorrhoids were found. The hemorrhoids were Grade      I (internal hemorrhoids that do not prolapse).                                                                                Estimated Blood Loss:      Estimated blood loss: none. Impression:            - Preparation of the colon was fair.                        - Diverticulosis in the sigmoid colon, at the hepatic                        flexure and in the ascending colon.                        -  Stool in the sigmoid colon and in the descending                        colon.                        - The examined portion of the ileum was normal.                        - Non-bleeding internal hemorrhoids.                        - No specimens collected. Recommendation:        - Further recommendations can be made by the GI                        consult team. Please contact them if necessary.                        - Return patient to hospital ward for ongoing care.                        - Advance diet as tolerated today.                        - Continue present medications.                        - Repeat colonoscopy for screening purposes as                        previously planned as bowel prep was inadequate.                                                                                Procedure Code(s):     --- Professional ---                        [phone], Colonoscopy, flexible; diagnostic, including                        collection of specimen(s) by brushing or washing, when                        performed (separate procedure) Diagnosis Code(s):     --- Professional ---                        K92.1, Melena (includes Hematochezia) CPT copyright 2022 American Medical Association. All rights reserved. The codes documented in this report are preliminary and upon coder review may be revised to meet current compliance requirements. Electronically Signed By Hebert Soho MD __________________ Hebert Soho, MD 10/26/2022 3:59:46 PM The attending physician was present throughout the entire procedure including the insertion, viewing, and removal of the endoscope. This procedure note has been electronically signed by: Hebert Soho , MD Number of Addenda: 0 Note  Initiated On: 10/26/2022 3:29 PM    ECG 12 Lead    Result Date: 10/25/2022  SINUS BRADYCARDIA WITH PREMATURE ATRIAL BEATS LEFT VENTRICULAR HYPERTROPHY WITH REPOLARIZATION ABNORMALITY ( R in aVL , Sokolow-Lyon , Cornell product , Romhilt-Estes ) WHEN COMPARED WITH ECG OF 26-May-2022 15:11, PREMATURE ATRIAL BEATS ARE NOW PRESENT Confirmed by Schuyler Amor ([id number]) on 10/25/2022 10:20:53 AM   ______________________________________________________________________  Discharge Instructions:   Activity Instructions       Activity as tolerated Follow Up instructions and Outpatient Referrals     Call MD for:  difficulty breathing, headache or visual disturbances      Call MD for:  persistent nausea or vomiting      Call MD for:  severe uncontrolled pain      Call MD for:  temperature >38.5 Celsius      Discharge instructions          Appointments which have been scheduled for you      Jan 12, 2023  8:00 AM  (Arrive by 7:45 AM)  RETURN NEPHROLOGY POST with Randal Ewing Schlein, MD  [facility] KIDNEY TRANSPLANT [place] [facility] REGION) [address]  [facility] 1 through 4  [place] [address]  [phone]        Jan 19, 2023  8:00 AM  INFUSION LVL 4.5 HRS with CHAIR [employer] TP INF [facility], RN 01 [facility] TP INF [facility]  [facility] [place] TRANSPLANT INFUSION [facility] REGION) [address]  [place] [address]  [phone]        Apr 11, 2023  9:00 AM  (Arrive by 8:30 AM)  RETURN  HEPATOLOGY with Pia Mau, MD  [facility] LIVER TRANSPLANT [place] [facility] REGION) [address]  [place] [address]  [phone]      Additional instructions:    Hospital follow up appointment has been scheduled with your  PCP Dr. Benetta Spar, on 11/03/22 @ 10:45 AM  Phone [phone]  / Fax [phone]         ______________________________________________________________________  Discharge Day Services:  BP 128/87  - Pulse 66  - Temp 36.7 ??C (98.1 ??F) (Oral)  - Resp 18  - Ht 162.6 cm (5' 4)  - Wt 78.9 kg (174 lb)  - LMP  (LMP Unknown)  - SpO2 96%  - BMI 29.87 kg/m??     Pt seen on the day of discharge and determined appropriate for discharge.    Condition at Discharge: stable    Length of Discharge: I spent greater than 30 mins in the discharge of this patient.

## 2022-11-01 DIAGNOSIS — K649 Unspecified hemorrhoids: Principal | ICD-10-CM

## 2022-11-01 DIAGNOSIS — Z944 Liver transplant status: Principal | ICD-10-CM

## 2022-11-01 NOTE — Unmapped (Signed)
Referral to GI provider.

## 2022-11-01 NOTE — Unmapped (Signed)
Received message from Dr. Sherryll Burger:  From: Pia Mau, MD  Sent: 10/26/2022   7:57 PM EST  To: Michelle Acosta, RN; *  Subject: RE: Follow up                                    Thanks Gabe. Front desk you can ignore.     Olegario Messier - can you have her follow up with her next week and see if she wants a referral to hemorrhoid clinic?     Thanks,  Lloyd Huger    ----- Message -----  From: Michelle Leyden, MD  Sent: 10/26/2022   5:07 PM EST  To: Pia Mau, MD; *  Subject: Follow up                                        Hi,    Patient was admitted for what was likely hemorrhoidal bleeding based on endoscopy findings. Patient is not having significant blood loss and would benefit from follow up to consider if further symptoms and needed referral to hemorrhoid clinic. Thank you.    Best,  Michelle Travis    Called pt to check in, left message with family member requesting a return call.    Pt returned call, asked to see local GI provider Dr. Karilyn Cota. Referral order sent.

## 2022-11-05 DIAGNOSIS — D849 Immunodeficiency, unspecified: Principal | ICD-10-CM

## 2022-11-05 DIAGNOSIS — N022 Recurrent and persistent hematuria with diffuse membranous glomerulonephritis: Principal | ICD-10-CM

## 2022-11-05 DIAGNOSIS — N042 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: Principal | ICD-10-CM

## 2022-11-05 DIAGNOSIS — Z48298 Encounter for aftercare following other organ transplant: Principal | ICD-10-CM

## 2022-11-05 DIAGNOSIS — Z940 Kidney transplant status: Principal | ICD-10-CM

## 2022-11-07 DIAGNOSIS — Z944 Liver transplant status: Secondary | ICD-10-CM | POA: Diagnosis not present

## 2022-11-07 DIAGNOSIS — K625 Hemorrhage of anus and rectum: Secondary | ICD-10-CM | POA: Diagnosis not present

## 2022-11-07 DIAGNOSIS — K5790 Diverticulosis of intestine, part unspecified, without perforation or abscess without bleeding: Secondary | ICD-10-CM | POA: Diagnosis not present

## 2022-11-07 DIAGNOSIS — K219 Gastro-esophageal reflux disease without esophagitis: Secondary | ICD-10-CM | POA: Diagnosis not present

## 2022-11-09 DIAGNOSIS — Z944 Liver transplant status: Secondary | ICD-10-CM | POA: Diagnosis not present

## 2022-11-09 DIAGNOSIS — Z0000 Encounter for general adult medical examination without abnormal findings: Secondary | ICD-10-CM | POA: Diagnosis not present

## 2022-11-09 DIAGNOSIS — M129 Arthropathy, unspecified: Secondary | ICD-10-CM | POA: Diagnosis not present

## 2022-11-09 DIAGNOSIS — C50919 Malignant neoplasm of unspecified site of unspecified female breast: Secondary | ICD-10-CM | POA: Diagnosis not present

## 2022-11-09 DIAGNOSIS — Z79899 Other long term (current) drug therapy: Secondary | ICD-10-CM | POA: Diagnosis not present

## 2022-11-09 DIAGNOSIS — M545 Low back pain, unspecified: Secondary | ICD-10-CM | POA: Diagnosis not present

## 2022-11-13 ENCOUNTER — Encounter (INDEPENDENT_AMBULATORY_CARE_PROVIDER_SITE_OTHER): Payer: Self-pay | Admitting: *Deleted

## 2022-11-13 ENCOUNTER — Ambulatory Visit: Payer: Medicare HMO | Admitting: Orthopaedic Surgery

## 2022-11-16 DIAGNOSIS — Z79899 Other long term (current) drug therapy: Secondary | ICD-10-CM | POA: Diagnosis not present

## 2022-11-20 ENCOUNTER — Ambulatory Visit (INDEPENDENT_AMBULATORY_CARE_PROVIDER_SITE_OTHER): Payer: 59 | Admitting: Adult Health

## 2022-11-20 ENCOUNTER — Other Ambulatory Visit (HOSPITAL_COMMUNITY)
Admission: RE | Admit: 2022-11-20 | Discharge: 2022-11-20 | Disposition: A | Discharge status: Home / Self Care | Payer: 59 | Source: Ambulatory Visit | Attending: Adult Health | Admitting: Adult Health

## 2022-11-20 ENCOUNTER — Encounter: Payer: Self-pay | Admitting: Adult Health

## 2022-11-20 VITALS — BP 127/83 | HR 88 | Ht 64.0 in | Wt 180.0 lb

## 2022-11-20 DIAGNOSIS — Z01419 Encounter for gynecological examination (general) (routine) without abnormal findings: Secondary | ICD-10-CM | POA: Diagnosis present

## 2022-11-20 DIAGNOSIS — Z940 Kidney transplant status: Secondary | ICD-10-CM | POA: Diagnosis not present

## 2022-11-20 DIAGNOSIS — Z1159 Encounter for screening for other viral diseases: Secondary | ICD-10-CM | POA: Diagnosis not present

## 2022-11-20 DIAGNOSIS — Z944 Liver transplant status: Secondary | ICD-10-CM | POA: Diagnosis not present

## 2022-11-20 DIAGNOSIS — Z79899 Other long term (current) drug therapy: Secondary | ICD-10-CM | POA: Diagnosis not present

## 2022-11-20 LAB — BASIC METABOLIC PANEL
BLOOD UREA NITROGEN: 11 mg/dL (ref 8–27)
BUN / CREAT RATIO: 18 (ref 12–28)
CALCIUM: 9 mg/dL (ref 8.7–10.3)
CHLORIDE: 104 mmol/L (ref 96–106)
CO2: 22 mmol/L (ref 20–29)
CREATININE: 0.61 mg/dL (ref 0.57–1.00)
EGFR: 98 mL/min/{1.73_m2}
GLUCOSE: 108 mg/dL — ABNORMAL HIGH (ref 70–99)
POTASSIUM: 4.4 mmol/L (ref 3.5–5.2)
SODIUM: 140 mmol/L (ref 134–144)

## 2022-11-20 LAB — CBC W/ DIFFERENTIAL
BANDED NEUTROPHILS ABSOLUTE COUNT: 0 10*3/uL (ref 0.0–0.1)
BASOPHILS ABSOLUTE COUNT: 0.1 10*3/uL (ref 0.0–0.2)
BASOPHILS RELATIVE PERCENT: 1 %
EOSINOPHILS ABSOLUTE COUNT: 0.2 10*3/uL (ref 0.0–0.4)
EOSINOPHILS RELATIVE PERCENT: 3 %
HEMATOCRIT: 38.9 % (ref 34.0–46.6)
HEMOGLOBIN: 12.1 g/dL (ref 11.1–15.9)
IMMATURE GRANULOCYTES: 0 %
LYMPHOCYTES ABSOLUTE COUNT: 1.5 10*3/uL (ref 0.7–3.1)
LYMPHOCYTES RELATIVE PERCENT: 24 %
MEAN CORPUSCULAR HEMOGLOBIN CONC: 31.1 g/dL — ABNORMAL LOW (ref 31.5–35.7)
MEAN CORPUSCULAR HEMOGLOBIN: 24.7 pg — ABNORMAL LOW (ref 26.6–33.0)
MEAN CORPUSCULAR VOLUME: 80 fL (ref 79–97)
MONOCYTES ABSOLUTE COUNT: 0.6 10*3/uL (ref 0.1–0.9)
MONOCYTES RELATIVE PERCENT: 10 %
NEUTROPHILS ABSOLUTE COUNT: 3.9 10*3/uL (ref 1.4–7.0)
NEUTROPHILS RELATIVE PERCENT: 62 %
PLATELET COUNT: 286 10*3/uL (ref 150–450)
RED BLOOD CELL COUNT: 4.89 x10E6/uL (ref 3.77–5.28)
RED CELL DISTRIBUTION WIDTH: 13.5 % (ref 11.7–15.4)
WHITE BLOOD CELL COUNT: 6.1 10*3/uL (ref 3.4–10.8)

## 2022-11-20 NOTE — Progress Notes (Signed)
Patient ID: Savannah Jackson, female   DOB: 1956-06-15, 67 y.o.   MRN: [id number] History of Present Illness: Savannah Jackson is a 67 year old black female,widowed, PM in for a well woman gyn exam and pap.  PCP is Dr Legrand Rams   Current Medications, Allergies, Past Medical History, Past Surgical History, Family History and Social History were reviewed in [place] record.     Review of Systems: Patient denies any headaches, hearing loss, fatigue, blurred vision, shortness of breath, chest pain, abdominal pain, problems with bowel movements, urination, or intercourse(not active). No joint pain or mood swings.  Needs left knee replacement, has brace on Denies any vaginal bleeding    Physical Exam:BP 127/83 (BP Location: Left Arm, Patient Position: Sitting, Cuff Size: Normal)   Pulse 88   Ht '5\' 4"'$  (1.626 m)   Wt 180 lb (81.6 kg)   BMI 30.90 kg/m   General:  Well developed, well nourished, no acute distress Skin:  Warm and dry Neck:  Midline trachea, normal thyroid, good ROM, no lymphadenopathy,no carotid bruits heard Lungs; Clear to auscultation bilaterally Breast:  No dominant palpable mass, retraction, or nipple discharge Cardiovascular: Regular rate and rhythm Abdomen:  Soft, non tender, no hepatosplenomegaly Pelvic:  External genitalia is normal in appearance, no lesions.  The vagina is pale with loss of rugae, used small Pederson speculum. Urethra has no lesions or masses. The cervix is smooth, pap with HR HPV genotyping performed.  Uterus is felt to be normal size, shape, and contour.  No adnexal masses or tenderness noted.Bladder is non tender, no masses felt. Rectal:deferred  Extremities/musculoskeletal:  No swelling or varicosities noted, no clubbing or cyanosis Psych:  No mood changes, alert and cooperative,seems happy AA is 0 Fall risk is low    11/20/2022    9:51 AM 11/13/2019    9:25 AM 09/10/2015   12:15 PM  Depression screen PHQ 2/9  Decreased Interest 0  0 0  Down, Depressed, Hopeless 0 0 0  PHQ - 2 Score 0 0 0  Altered sleeping 0    Tired, decreased energy 0    Change in appetite 0    Feeling bad or failure about yourself  0    Trouble concentrating 0    Moving slowly or fidgety/restless 0    Suicidal thoughts 0    PHQ-9 Score 0         11/20/2022    9:51 AM  GAD 7 : Generalized Anxiety Score  Nervous, Anxious, on Edge 0  Control/stop worrying 0  Worry too much - different things 0  Trouble relaxing 0  Restless 0  Easily annoyed or irritable 0  Afraid - awful might happen 0  Total GAD 7 Score 0      Upstream - 11/20/22 0958       Pregnancy Intention Screening   Does the patient want to become pregnant in the next year? N/A    Does the patient's partner want to become pregnant in the next year? N/A    Would the patient like to discuss contraceptive options today? N/A      Contraception Wrap Up   Current Method Abstinence   PM   End Method Abstinence   PM   Contraception Counseling Provided No            Examination chaperoned by Levy Pupa LPN   Impression and Plan: 1. Encounter for gynecological examination with Papanicolaou smear of cervix Pap sent Pap in 3 years if  normal Physical with PCP next year Labs with PCP Mammogram was negative 05/29/22 Colonoscopy per GI  Stay active

## 2022-11-21 LAB — MICROSCOPIC EXAMINATION
BACTERIA: NONE SEEN
CASTS: NONE SEEN /LPF

## 2022-11-21 LAB — URINALYSIS WITH MICROSCOPY
BILIRUBIN UA: NEGATIVE
BLOOD UA: NEGATIVE
GLUCOSE UA: NEGATIVE
KETONES UA: NEGATIVE
LEUKOCYTE ESTERASE UA: NEGATIVE
NITRITE UA: NEGATIVE
PH UA: 6.5 (ref 5.0–7.5)
SPECIFIC GRAVITY UA: 1.017 (ref 1.005–1.030)
UROBILINOGEN UA: 1 mg/dL (ref 0.2–1.0)

## 2022-11-21 LAB — PROTEIN / CREATININE RATIO, URINE
CREATININE URINE: 112.5 mg/dL
PROTEIN URINE: 117.7 mg/dL
PROTEIN/CREAT RATIO: 1046 mg/g{creat} — ABNORMAL HIGH (ref 0–200)

## 2022-11-21 LAB — MAGNESIUM: MAGNESIUM: 1.6 mg/dL (ref 1.6–2.3)

## 2022-11-21 LAB — PHOSPHORUS: PHOSPHORUS, SERUM: 3.2 mg/dL (ref 3.0–4.3)

## 2022-11-21 LAB — TACROLIMUS LEVEL: TACROLIMUS BLOOD: 2.8 ng/mL (ref 2.0–20.0)

## 2022-11-21 NOTE — Unmapped (Signed)
Patient completed kidney txp labs yesterday, including bmp. Contacted [facility] to add on liver txp labs.

## 2022-11-22 LAB — BILIRUBIN, TOTAL: BILIRUBIN TOTAL (MG/DL) IN SER/PLAS: 0.3 mg/dL (ref 0.0–1.2)

## 2022-11-22 LAB — AST: AST (SGOT): 18 IU/L (ref 0–40)

## 2022-11-22 LAB — ALKALINE PHOSPHATASE: ALKALINE PHOSPHATASE: 68 IU/L (ref 44–121)

## 2022-11-22 LAB — ALT: ALT (SGPT): 13 IU/L (ref 0–32)

## 2022-11-22 LAB — SPECIMEN STATUS REPORT

## 2022-11-22 LAB — GAMMA GT: GAMMA GLUTAMYL TRANSFERASE: 20 IU/L (ref 0–60)

## 2022-11-22 LAB — BILIRUBIN, DIRECT: BILIRUBIN DIRECT: 0.1 mg/dL (ref 0.00–0.40)

## 2022-11-22 NOTE — Unmapped (Signed)
[facility] Refill Coordination Note    Specialty Medication(s) to be Shipped:   Transplant:  mycophenolic acid 180mg     Other medication(s) to be shipped:  vit d, pantoprazole, atorvastatin     Michelle Travis, DOB: 1956/08/17  Phone: [phone] (home)       All above HIPAA information was verified with patient.     Was a Nurse, learning disability used for this call? No    Completed refill call assessment today to schedule patient's medication shipment from the [facility] [phone]).  All relevant notes have been reviewed.     Specialty medication(s) and dose(s) confirmed: Regimen is correct and unchanged.   Changes to medications: [name] reports no changes at this time.  Changes to insurance: No  New side effects reported not previously addressed with a pharmacist or physician: None reported  Questions for the pharmacist: No    Confirmed patient received a Conservation officer, historic buildings and a Surveyor, mining with first shipment. The patient will receive a drug information handout for each medication shipped and additional FDA Medication Guides as required.       DISEASE/MEDICATION-SPECIFIC INFORMATION        N/A    SPECIALTY MEDICATION ADHERENCE     Medication Adherence    Patient reported X missed doses in the last month: 0  Specialty Medication: Mycophenolate 180mg   Patient is on additional specialty medications: No      Adherence tools used: patient uses a pill box to manage medications                          Were doses missed due to medication being on hold? No    Mycophenolate 180 mg: 7 days of medicine on hand     REFERRAL TO PHARMACIST     Referral to the pharmacist: Not needed      [facility]     Shipping address confirmed in Epic.     Delivery Scheduled: Yes, Expected medication delivery date: 11/28/22.     Medication will be delivered via UPS to the prescription address in Epic WAM.    [name], [facility]   [facility] Specialty Pharmacist

## 2022-11-23 NOTE — Unmapped (Signed)
Urine culture resulted:    Beta hemolytic Streptococcus, group B   10,000-25,000 colony forming units per mL     Patient denies any UTI symptoms, will increase hydration and let me know if she develops any symptoms. No other needs at this time.

## 2022-11-27 ENCOUNTER — Ambulatory Visit: Payer: Medicare HMO | Admitting: Orthopaedic Surgery

## 2022-11-27 DIAGNOSIS — Z944 Liver transplant status: Principal | ICD-10-CM

## 2022-11-27 LAB — CYTOLOGY - PAP
Comment: NEGATIVE
Diagnosis: NEGATIVE
High risk HPV: NEGATIVE

## 2022-11-27 NOTE — Unmapped (Signed)
Michelle Travis 's entire shipment will be delayed as a result of the medication is too soon to refill until 11/28/2022.     I have reached out to the patient  at [phone] and communicated the delivery change. We will reschedule the medication for the delivery date that the patient agreed upon.  We have confirmed the delivery date as 11/29/2022, via ups.

## 2022-11-28 DIAGNOSIS — Z940 Kidney transplant status: Principal | ICD-10-CM

## 2022-11-28 DIAGNOSIS — Z944 Liver transplant status: Principal | ICD-10-CM

## 2022-11-28 MED FILL — ATORVASTATIN 10 MG TABLET: ORAL | 90 days supply | Qty: 90 | Fill #2

## 2022-11-28 MED FILL — CHOLECALCIFEROL (VITAMIN D3) 25 MCG (1,000 UNIT) TABLET: ORAL | 90 days supply | Qty: 90 | Fill #2

## 2022-11-28 MED FILL — PANTOPRAZOLE 40 MG TABLET,DELAYED RELEASE: ORAL | 90 days supply | Qty: 90 | Fill #2

## 2022-11-28 MED FILL — MYCOPHENOLATE SODIUM 180 MG TABLET,DELAYED RELEASE: ORAL | 90 days supply | Qty: 360 | Fill #2

## 2022-11-29 ENCOUNTER — Encounter: Payer: Self-pay | Admitting: Orthopaedic Surgery

## 2022-11-29 ENCOUNTER — Ambulatory Visit (INDEPENDENT_AMBULATORY_CARE_PROVIDER_SITE_OTHER): Payer: 59 | Admitting: Orthopaedic Surgery

## 2022-11-29 DIAGNOSIS — M1712 Unilateral primary osteoarthritis, left knee: Secondary | ICD-10-CM

## 2022-11-29 NOTE — Progress Notes (Signed)
Office Visit Note   Patient: Savannah Jackson           Date of Birth: 1956-10-28           MRN: [id number] Visit Date: 11/29/2022              Requested by: Carrolyn Meiers, MD [address] [place],  [place] [address] PCP: Carrolyn Meiers, MD   Assessment & Plan: Visit Diagnoses:  1. Unilateral primary osteoarthritis, left knee     Plan:  Set her up for a left total knee arthroplasty in late March early April after she has been cleared by nephrology.  She has a transfusion she needs to undergo prior to setting up surgery.  Risk benefits of surgery discussed.  Questions were encouraged and answered by Dr. Delilah Jackson itself.  Risk include but are not limited to DVT/PE, wound healing problems, blood loss, prolonged pain worsening pain, and infection.   Follow-Up Instructions: Return 2 weeks post op.   Orders:  No orders of the defined types were placed in this encounter.  No orders of the defined types were placed in this encounter.     Procedures: No procedures performed   Clinical Data: No additional findings.   Subjective: Chief Complaint  Patient presents with   Right Hip - Follow-up   Lower Back - Follow-up    HPI Savannah Jackson returns today for follow-up above her low back pain and also her left knee.  She states the epidural steroid injections with Dr. Merrilee Seashore gave her no relief from the low back pain she does not have radicular symptoms.  Regards to her right total hip which she had on 12/09/2021 she states is doing well.  She is having significant pain in her left knee but states that the brace does help some.  She is in chronic pain clinic.  She also has undergone renal transplant and is on immunosuppression.  She states that she has been told by nephrology that she can undergo surgery after transfusion that she is due to have in March.  She has had no new injury to the knee.  She does have severe end-stage arthritis of the left knee.  She  denies any alcohol or tobacco use.  History of right total knee arthroplasty 2002 done in [place] which she did well with.  Review of Systems See HPI otherwise negative  Objective: Vital Signs: There were no vitals taken for this visit.  Physical Exam Constitutional:      Appearance: She is normal weight. She is not diaphoretic.  Pulmonary:     Effort: Pulmonary effort is normal.  Neurological:     Mental Status: She is alert and oriented to person, place, and time.  Psychiatric:        Mood and Affect: Mood normal.     Ortho Exam Left knee significant crepitus with passive range of motion.  But overall good range of motion.  No abnormal warmth erythema.  No instability valgus varus stressing.  Left calf supple nontender dorsiflexion plantarflexion left ankle intact. Right hip good range of motion without pain. Specialty Comments:  No specialty comments available.  Imaging: No results found.   PMFS History: Patient Active Problem List   Diagnosis Date Noted   Unilateral primary osteoarthritis, left knee 04/20/2022   Status post total hip replacement, right 12/09/2021   Unilateral primary osteoarthritis, right hip 09/26/2021   Immunosuppressed status ([place]) 03/22/2020   Encounter for gynecological examination with Papanicolaou smear  of cervix 11/13/2019   Screening for colorectal cancer 11/13/2019   History of colonic polyps 05/17/2018   Aftercare following organ transplant 10/17/2017   Kidney transplanted 09/14/2017   Status post liver transplantation ([place]) 09/14/2017   Acute encephalopathy    Hyperammonemia ([place]) 07/15/2017   ESRD on dialysis [facility])    Rectus sheath hematoma 11/22/2016   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   S/P thoracentesis    Acute renal failure superimposed on stage 4 chronic kidney disease ([place])    Hypoalbuminemia 10/29/2016   Leukocytosis 32/35/5732   Diastolic dysfunction with chronic heart failure  ([place]) 10/29/2016   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   Chronic kidney disease (CKD) stage G3b/A3, moderately decreased glomerular filtration rate (GFR) between 30-44 mL/min/1.73 square meter and albuminuria creatinine ratio greater than 300 mg/g (HCC) 08/30/2016   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 08/30/2016   Thickened endometrium 06/19/2016   PMB (postmenopausal bleeding) 06/07/2016   History of breast cancer 06/07/2016   Cough    Esophageal varices ([place]) 20/25/4270   Alcoholic cirrhosis of liver without ascites ([place])    Anemia of chronic disease    Peripheral edema 11/26/2015   Anasarca 62/37/6283   Metabolic acidosis 15/17/6160   UTI (lower urinary tract infection) 09/24/2015   Elevated troponin 09/24/2015   Altered mental status 08/30/2015   Hepatic encephalopathy ([place]) 08/30/2015   Acute respiratory failure with hypoxia (HCC)    Recurrent right pleural effusion    Alcoholic cirrhosis of liver with ascites (HCC)    Pleural effusion associated with hepatic disorder 07/03/2015   Chronic kidney disease (CKD), stage IV (severe) ([place]) 07/03/2015   Elevated INR 07/03/2015   Acute respiratory distress 07/03/2015   BRBPR (bright red blood per rectum) 04/21/2015   Pleural effusion, right 04/21/2015   Sleep apnea    Chronic back pain    ETOH abuse    Bilateral leg edema    Cirrhosis, alcoholic ([place]) 73/71/0626   Abdominal pain, left lower quadrant 02/17/2013   Diverticulitis of colon without hemorrhage 02/17/2013   Hypertension    Past Medical History:  Diagnosis Date   Bilateral leg edema    Brain aneurysm    Breast disorder    right breast cancer 2002   Cancer [facility])    breast cancer- lumpectromy, , chemotherapy, radiation   Chronic back pain Diverticultis   Chronic kidney disease (CKD) stage G3b/A3, moderately decreased glomerular filtration rate (GFR) between 30-44 mL/min/1.73 square meter and albuminuria creatinine ratio greater than 300 mg/g ([place])  08/30/2016   Cirrhosis ([place])    alcoholic   DDD (degenerative disc disease), lumbar    Diverticulosis    scope 2014   ETOH abuse    Hepatic encephalopathy ([place]) 04/23/2017   Hepatomegaly    scope 2014   History of breast cancer 06/07/2016   Hypertension    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   Pneumonia    Rotator cuff syndrome of left shoulder    Sleep apnea    does not wear CPAP   Steal syndrome as complication of dialysis access ([place])    Thickened endometrium 06/19/2016   Will get endo biopsy    Wears glasses     Family History  Problem Relation Age of Onset   Aneurysm Mother    Other Daughter        knee replacement   Hypertension Daughter     Past Surgical History:  Procedure Laterality Date  AV FISTULA PLACEMENT Left 05/22/2017   Procedure: BRACHIAL ARTERY TO BASCILIC VEIN  FISTULA CREATION;  Surgeon: Elam Dutch, MD;  Location: [place];  Service: Vascular;  Laterality: Left;   [place] Left 08/07/2017   Procedure: BASILIC VEIN TRANSPOSITION SECOND STAGE;  Surgeon: Elam Dutch, MD;  Location: [facility];  Service: Vascular;  Laterality: Left;   BRAIN SURGERY     aneurysm at [place] Right    cancer- lunmmnpectomy-    CATARACT EXTRACTION Bilateral    [facility] 2 or 3 years ago   CHOLECYSTECTOMY     COLONOSCOPY N/A 04/10/2013   Procedure: COLONOSCOPY;  Surgeon: Rogene Houston, MD;  [facility];  Service: Endoscopy;  Laterality: N/A;  1030-rescheduled to 1200 Ann notified pt   COLONOSCOPY N/A 08/07/2018   Procedure: COLONOSCOPY;  Surgeon: Rogene Houston, MD;  [facility];  Service: Endoscopy;  Laterality: N/A;  930   ESOPHAGOGASTRODUODENOSCOPY N/A 04/22/2015   Procedure: ESOPHAGOGASTRODUODENOSCOPY (EGD);  Surgeon: Rogene Houston, MD;  [facility];  Service: Endoscopy;  Laterality: N/A;   EYE SURGERY     IR  GENERIC HISTORICAL  07/04/2016   IR RADIOLOGIST EVAL & MGMT 07/04/2016 Jacqulynn Cadet, MD [facility] INTERV RAD   IR RADIOLOGIST EVAL & MGMT  07/24/2017   KIDNEY SURGERY     KNEE ARTHROSCOPY Left    LIVER TRANSPLANT     POLYPECTOMY  08/07/2018   Procedure: POLYPECTOMY;  Surgeon: Rogene Houston, MD;  [facility];  Service: Endoscopy;;  colon    RADIOLOGY WITH ANESTHESIA N/A 07/16/2015   Procedure: TIPS;  Surgeon: Medication Radiologist, MD;  Location: [place];  Service: Radiology;  Laterality: N/A;   REVISON OF ARTERIOVENOUS FISTULA Left 01/06/2019   Procedure: LEFT ARM LIGATION OF ARTERIOVENOUS FISTULA;  Surgeon: Elam Dutch, MD;  [facility];  Service: Vascular;  Laterality: Left;   ROTATOR CUFF REPAIR Left    TOTAL HIP ARTHROPLASTY Right 12/09/2021   Procedure: RIGHT TOTAL HIP ARTHROPLASTY ANTERIOR APPROACH;  Surgeon: Mcarthur Rossetti, MD;  Location: [facility];  Service: Orthopedics;  Laterality: Right;   TOTAL KNEE ARTHROPLASTY Right    right. 2002   Social History   Occupational History   Occupation: disabled  Tobacco Use   Smoking status: Never   Smokeless tobacco: Never  Vaping Use   Vaping Use: Never used  Substance and Sexual Activity   Alcohol use: No    Alcohol/week: 0.0 standard drinks of alcohol    Comment: none since 03/2015   Drug use: Never   Sexual activity: Not Currently    Birth control/protection: Post-menopausal, Abstinence

## 2022-12-05 ENCOUNTER — Ambulatory Visit (INDEPENDENT_AMBULATORY_CARE_PROVIDER_SITE_OTHER): Payer: 59 | Admitting: Gastroenterology

## 2022-12-08 DIAGNOSIS — E785 Hyperlipidemia, unspecified: Secondary | ICD-10-CM | POA: Diagnosis not present

## 2022-12-08 DIAGNOSIS — M545 Low back pain, unspecified: Secondary | ICD-10-CM | POA: Diagnosis not present

## 2022-12-08 DIAGNOSIS — M129 Arthropathy, unspecified: Secondary | ICD-10-CM | POA: Diagnosis not present

## 2022-12-08 DIAGNOSIS — Z79899 Other long term (current) drug therapy: Secondary | ICD-10-CM | POA: Diagnosis not present

## 2022-12-08 DIAGNOSIS — K219 Gastro-esophageal reflux disease without esophagitis: Secondary | ICD-10-CM | POA: Diagnosis not present

## 2022-12-13 DIAGNOSIS — Z79899 Other long term (current) drug therapy: Secondary | ICD-10-CM | POA: Diagnosis not present

## 2022-12-18 DIAGNOSIS — Z1159 Encounter for screening for other viral diseases: Principal | ICD-10-CM

## 2022-12-18 DIAGNOSIS — Z79899 Other long term (current) drug therapy: Principal | ICD-10-CM

## 2022-12-18 DIAGNOSIS — Z940 Kidney transplant status: Principal | ICD-10-CM

## 2022-12-27 NOTE — H&P (View-Only) (Signed)
  GI Office Note    Referring Provider: Fanta, Tesfaye Demissie* Primary Care Physician:  Fanta, Tesfaye Demissie, MD  Primary Gastroenterologist: Robert M.Rourk, MD  Chief Complaint   Chief Complaint  Patient presents with   Follow-up    Hospital follow up.   History of Present Illness   Savannah Jackson is a 66 y.o. female presenting today at the request of Fanta, Tesfaye Demissie* for evaluation of hemorrhoids, hospital follow up.  Colonoscopy October 2019: -6 mm polyp in proximal transverse colon -Multiple small and large mouth diverticula found in the entire colon -Repeat colonoscopy in 5 years for surveillance.  Admitted to [facility] in December 2023 for bright red blood per rectum.  Patient had episodes of rectal bleeding about every 3 hours while in the ED.  No hemorrhoids or fissures appreciated on rectal exam.  Had tenderness to palpation of the left upper quadrant and left lower quadrant.  Started on PPI drip.  Vitals remained stable.  Colonoscopy was performed as outlined below.  Per notes bleeding suspected to be secondary to diverticula, hemorrhoids, or small bowel AVM.  Her diet was advanced she was discharged home hemoglobin 10/27/2028 stable at 12.2.  Colonoscopy 10/18/2022 completed at [facility]: -Multiple largemouth diverticula in sigmoid colon, type flexure, descending colon without active bleeding but did note scant old blood in sigmoid colon -Moderate amount of solid stool in the sigmoid colon and descending colon interfering with visualization -Nonbleeding internal hemorrhoids, grade 1 -Repeat colonoscopy for screening purposes due to inadequate bowel prep. -Patient requested referral regarding hemorrhoid evaluation locally.  Today: Bleeding started at 4 am and bleed until 10 pm that night. Was large amounts each time. No abdominal pain with it. No bleeding since the hospital. Reports she does not like the hospitals here.   No upper or lower GI symptoms. No  chest pain, edema, shortness of breath.   Follows up in June for liver check up and kidney check up in March.   Current Outpatient Medications  Medication Sig Dispense Refill   Acetaminophen 325 MG CAPS Take 650 mg by mouth as needed (Headache).     aspirin 81 MG chewable tablet Chew 1 tablet (81 mg total) by mouth 2 (two) times daily. 30 tablet 0   atorvastatin (LIPITOR) 10 MG tablet Take 10 mg by mouth daily.      BELBUCA 300 MCG FILM Take 300 mcg by mouth at bedtime.     cholecalciferol (VITAMIN D) 1000 units tablet Take 1,000 Units by mouth daily.     ENVARSUS XR 1 MG TB24 Take 2 mg by mouth daily before breakfast.     methocarbamol (ROBAXIN) 500 MG tablet Take 1 tablet (500 mg total) by mouth every 6 (six) hours as needed for muscle spasms. 40 tablet 1   mycophenolate (MYFORTIC) 180 MG EC tablet Take 360 mg by mouth in the morning.     oxyCODONE (OXY IR/ROXICODONE) 5 MG immediate release tablet Take 1-2 tablets (5-10 mg total) by mouth every 4 (four) hours as needed for moderate pain (pain score 4-6). 30 tablet 0   pantoprazole (PROTONIX) 40 MG tablet Take 40 mg by mouth daily.     ondansetron (ZOFRAN-ODT) 4 MG disintegrating tablet Take 1 tablet (4 mg total) by mouth every 4 (four) hours as needed for nausea or vomiting. (Patient not taking: Reported on 12/28/2022) 20 tablet 0   No current facility-administered medications for this visit.    Past Medical History:  Diagnosis Date   Bilateral leg   edema    Brain aneurysm    Breast disorder    right breast cancer 2002   Cancer (HCC)    breast cancer- lumpectromy, , chemotherapy, radiation   Chronic back pain Diverticultis   Chronic kidney disease (CKD) stage G3b/A3, moderately decreased glomerular filtration rate (GFR) between 30-44 mL/min/1.73 square meter and albuminuria creatinine ratio greater than 300 mg/g (HCC) 08/30/2016   Cirrhosis (HCC)    alcoholic   DDD (degenerative disc disease), lumbar    Diverticulosis    scope 2014    ETOH abuse    Hepatic encephalopathy (HCC) 04/23/2017   Hepatomegaly    scope 2014   History of breast cancer 06/07/2016   Hypertension    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   Pneumonia    Rotator cuff syndrome of left shoulder    Sleep apnea    does not wear CPAP   Steal syndrome as complication of dialysis access (HCC)    Thickened endometrium 06/19/2016   Will get endo biopsy    Wears glasses     Past Surgical History:  Procedure Laterality Date   AV FISTULA PLACEMENT Left 05/22/2017   Procedure: BRACHIAL ARTERY TO BASCILIC VEIN  FISTULA CREATION;  Surgeon: Fields, Charles E, MD;  Location: [facility] OR;  Service: Vascular;  Laterality: Left;   BASCILIC VEIN TRANSPOSITION Left 08/07/2017   Procedure: BASILIC VEIN TRANSPOSITION SECOND STAGE;  Surgeon: Fields, Charles E, MD;  Location: [facility] OR;  Service: Vascular;  Laterality: Left;   BRAIN SURGERY     aneurysm at [facility]   BREAST SURGERY Right    cancer- lunmmnpectomy-    CATARACT EXTRACTION Bilateral    [facility] 2 or 3 years ago   CHOLECYSTECTOMY     COLONOSCOPY N/A 04/10/2013   Procedure: COLONOSCOPY;  Surgeon: Najeeb U Rehman, MD;  Location: [facility];  Service: Endoscopy;  Laterality: N/A;  1030-rescheduled to 1200 Ann notified pt   COLONOSCOPY N/A 08/07/2018   Procedure: COLONOSCOPY;  Surgeon: Rehman, Najeeb U, MD;  Location: [facility];  Service: Endoscopy;  Laterality: N/A;  930   ESOPHAGOGASTRODUODENOSCOPY N/A 04/22/2015   Procedure: ESOPHAGOGASTRODUODENOSCOPY (EGD);  Surgeon: Najeeb U Rehman, MD;  Location: [facility];  Service: Endoscopy;  Laterality: N/A;   EYE SURGERY     IR GENERIC HISTORICAL  07/04/2016   IR RADIOLOGIST EVAL & MGMT 07/04/2016 Heath McCullough, MD [facility] INTERV RAD   IR RADIOLOGIST EVAL & MGMT  07/24/2017   KIDNEY SURGERY     KNEE ARTHROSCOPY Left    LIVER TRANSPLANT     POLYPECTOMY  08/07/2018   Procedure:  POLYPECTOMY;  Surgeon: Rehman, Najeeb U, MD;  Location: [facility];  Service: Endoscopy;;  colon    RADIOLOGY WITH ANESTHESIA N/A 07/16/2015   Procedure: TIPS;  Surgeon: Medication Radiologist, MD;  Location: [facility];  Service: Radiology;  Laterality: N/A;   REVISON OF ARTERIOVENOUS FISTULA Left 01/06/2019   Procedure: LEFT ARM LIGATION OF ARTERIOVENOUS FISTULA;  Surgeon: Fields, Charles E, MD;  Location: [facility] OR;  Service: Vascular;  Laterality: Left;   ROTATOR CUFF REPAIR Left    TOTAL HIP ARTHROPLASTY Right 12/09/2021   Procedure: RIGHT TOTAL HIP ARTHROPLASTY ANTERIOR APPROACH;  Surgeon: Blackman, Christopher Y, MD;  Location: [facility];  Service: Orthopedics;  Laterality: Right;   TOTAL KNEE ARTHROPLASTY Right    right. 2002    Family History  Problem Relation Age of Onset   Aneurysm Mother      Other Daughter        knee replacement   Hypertension Daughter     Allergies as of 12/28/2022 - Review Complete 12/28/2022  Allergen Reaction Noted   Latex Swelling and Other (See Comments) 07/15/2012   Vicodin [hydrocodone-acetaminophen] Itching 09/06/2012    Social History   Socioeconomic History   Marital status: Widowed    Spouse name: Not on file   Number of children: Not on file   Years of education: Not on file   Highest education level: Not on file  Occupational History   Occupation: disabled  Tobacco Use   Smoking status: Never   Smokeless tobacco: Never  Vaping Use   Vaping Use: Never used  Substance and Sexual Activity   Alcohol use: No    Alcohol/week: 0.0 standard drinks of alcohol    Comment: none since 03/2015   Drug use: Never   Sexual activity: Not Currently    Birth control/protection: Post-menopausal, Abstinence  Other Topics Concern   Not on file  Social History Narrative   Patient is primary caregiver for a wheelchair ridden husband who is a bilateral amputee. However he is fairly capable of taking care of himself at home. This couple have an attentive  daughter who keeps an eye on both of them.   Social Determinants of Health   Financial Resource Strain: Low Risk  (11/20/2022)   Overall Financial Resource Strain (CARDIA)    Difficulty of Paying Living Expenses: Not hard at all  Food Insecurity: No Food Insecurity (11/20/2022)   Hunger Vital Sign    Worried About Running Out of Food in the Last Year: Never true    Ran Out of Food in the Last Year: Never true  Transportation Needs: No Transportation Needs (11/20/2022)   PRAPARE - Transportation    Lack of Transportation (Medical): No    Lack of Transportation (Non-Medical): No  Physical Activity: Sufficiently Active (11/20/2022)   Exercise Vital Sign    Days of Exercise per Week: 7 days    Minutes of Exercise per Session: 70 min  Stress: No Stress Concern Present (11/20/2022)   [employer] - Occupational Stress Questionnaire    Feeling of Stress : Only a little  Social Connections: Moderately Isolated (11/20/2022)   Social Connection and Isolation Panel [NHANES]    Frequency of Communication with Friends and Family: More than three times a week    Frequency of Social Gatherings with Friends and Family: Once a week    Attends Religious Services: More than 4 times per year    Active Member of Clubs or Organizations: No    Attends Club or Organization Meetings: Never    Marital Status: Widowed  Intimate Partner Violence: Not At Risk (11/20/2022)   Humiliation, Afraid, Rape, and Kick questionnaire    Fear of Current or Ex-Partner: No    Emotionally Abused: No    Physically Abused: No    Sexually Abused: No     Review of Systems   Gen: Denies any fever, chills, fatigue, weight loss, lack of appetite.  CV: Denies chest pain, heart palpitations, peripheral edema, syncope.  Resp: Denies shortness of breath at rest or with exertion. Denies wheezing or cough.  GI: see HPI GU : Denies urinary burning, urinary frequency, urinary hesitancy MS: Denies joint pain,  muscle weakness, cramps, or limitation of movement.  Derm: Denies rash, itching, dry skin Psych: Denies depression, anxiety, memory loss, and confusion Heme: Denies bruising, bleeding, and enlarged lymph nodes.     Physical Exam   BP 118/78 (BP Location: Right Arm, Patient Position: Sitting, Cuff Size: Normal)   Pulse 60   Temp (!) 97.5 F (36.4 C) (Temporal)   Ht 5' 4" (1.626 m)   Wt 184 lb 6.4 oz (83.6 kg)   SpO2 96%   BMI 31.65 kg/m   General:   Alert and oriented. Pleasant and cooperative. Well-nourished and well-developed.  Head:  Normocephalic and atraumatic. Eyes:  Without icterus, sclera clear and conjunctiva pink.  Ears:  Normal auditory acuity. Mouth:  No deformity or lesions, oral mucosa pink.  Lungs:  Clear to auscultation bilaterally. No wheezes, rales, or rhonchi. No distress.  Heart:  S1, S2 present without murmurs appreciated.  Abdomen:  +BS, soft, non-tender and non-distended. No HSM noted. No guarding or rebound. No masses appreciated.  Rectal:  Deferred Msk:  Symmetrical without gross deformities. Normal posture. Extremities:  Without edema. Neurologic:  Alert and  oriented x4;  grossly normal neurologically. Skin:  Intact without significant lesions or rashes. Psych:  Alert and cooperative. Normal mood and affect.   Assessment   Savannah Jackson is a 66 y.o. female with a history of alcoholic cirrhosis s/p liver-kidney transplant in 2018, HTN, degenerative disc disease, breast cancer in 2002 presenting today for evaluation of prior rectal bleeding/hospital follow-up.  Rectal bleeding, hemorrhoids: Had about 16 hours of painless rectal bleeding with clots in December 2023. Went to ED at [facility] and had stable hgb. Underwent colonoscopy with inadequate prep as outlined in HPI. No further bleeding since hospitalization. Denies any upper or lower GI symptoms. Has continued on daily PPI.   History of colon polyps: Last coloscopy in 2019 with pan-colonic  diverticulosis and single hyperplastic polyp but recommended repeat in 5 years.  Given recent episode of rectal bleeding and inadequate prep, repeat colonoscopy for screening/surveillance recommended. Patient has agreed to proceed with scheduling colonoscopy. Will augment prep with Linzess to ensure adequate visualization.   PLAN   Proceed with colonoscopy with propofol by Dr. Rourk in near future: the risks, benefits, and alternatives have been discussed with the patient in detail. The patient states understanding and desires to proceed. ASA 3 Linzess 145 mcg daily for 3 days prior to procedure   Savannah Borello, MSN, FNP-BC, AGACNP-BC [facility] 

## 2022-12-27 NOTE — Progress Notes (Unsigned)
GI Office Note    Referring Provider: Carrolyn Meiers* Primary Care Physician:  Carrolyn Meiers, MD  Primary Gastroenterologist: [name].Rourk, MD  Chief Complaint   Chief Complaint  Patient presents with   [facility] follow up.   History of Present Illness   Savannah Jackson is a 67 y.o. female presenting today at the request of Fanta, Tesfaye Demissie* for evaluation of hemorrhoids, hospital follow up.  Colonoscopy October 2019: -6 mm polyp in proximal transverse colon -Multiple small and large mouth diverticula found in the entire colon -Repeat colonoscopy in 5 years for surveillance.  Admitted to [facility] in December 2023 for bright red blood per rectum.  Patient had episodes of rectal bleeding about every 3 hours while in the ED.  No hemorrhoids or fissures appreciated on rectal exam.  Had tenderness to palpation of the left upper quadrant and left lower quadrant.  Started on PPI drip.  Vitals remained stable.  Colonoscopy was performed as outlined below.  Per notes bleeding suspected to be secondary to diverticula, hemorrhoids, or small bowel AVM.  Her diet was advanced she was discharged home hemoglobin 10/27/2028 stable at 12.2.  Colonoscopy 10/18/2022 completed at [facility]: -Multiple largemouth diverticula in sigmoid colon, type flexure, descending colon without active bleeding but did note scant old blood in sigmoid colon -Moderate amount of solid stool in the sigmoid colon and descending colon interfering with visualization -Nonbleeding internal hemorrhoids, grade 1 -Repeat colonoscopy for screening purposes due to inadequate bowel prep. -Patient requested referral regarding hemorrhoid evaluation locally.  Today: Bleeding started at 4 am and bleed until 10 pm that night. Was large amounts each time. No abdominal pain with it. No bleeding since the hospital. Reports she does not like the hospitals here.   No upper or lower GI symptoms. No  chest pain, edema, shortness of breath.   Follows up in June for liver check up and kidney check up in March.   Current Outpatient Medications  Medication Sig Dispense Refill   Acetaminophen 325 MG CAPS Take 650 mg by mouth as needed (Headache).     aspirin 81 MG chewable tablet Chew 1 tablet (81 mg total) by mouth 2 (two) times daily. 30 tablet 0   atorvastatin (LIPITOR) 10 MG tablet Take 10 mg by mouth daily.      BELBUCA 300 MCG FILM Take 300 mcg by mouth at bedtime.     cholecalciferol (VITAMIN D) 1000 units tablet Take 1,000 Units by mouth daily.     ENVARSUS XR 1 MG TB24 Take 2 mg by mouth daily before breakfast.     methocarbamol (ROBAXIN) 500 MG tablet Take 1 tablet (500 mg total) by mouth every 6 (six) hours as needed for muscle spasms. 40 tablet 1   mycophenolate (MYFORTIC) 180 MG EC tablet Take 360 mg by mouth in the morning.     oxyCODONE (OXY IR/ROXICODONE) 5 MG immediate release tablet Take 1-2 tablets (5-10 mg total) by mouth every 4 (four) hours as needed for moderate pain (pain score 4-6). 30 tablet 0   pantoprazole (PROTONIX) 40 MG tablet Take 40 mg by mouth daily.     ondansetron (ZOFRAN-ODT) 4 MG disintegrating tablet Take 1 tablet (4 mg total) by mouth every 4 (four) hours as needed for nausea or vomiting. (Patient not taking: Reported on 12/28/2022) 20 tablet 0   No current facility-administered medications for this visit.    Past Medical History:  Diagnosis Date   Bilateral leg  edema    Brain aneurysm    Breast disorder    right breast cancer 2002   Cancer [facility])    breast cancer- lumpectromy, , chemotherapy, radiation   Chronic back pain Diverticultis   Chronic kidney disease (CKD) stage G3b/A3, moderately decreased glomerular filtration rate (GFR) between 30-44 mL/min/1.73 square meter and albuminuria creatinine ratio greater than 300 mg/g ([place]) 08/30/2016   Cirrhosis ([place])    alcoholic   DDD (degenerative disc disease), lumbar    Diverticulosis    scope 2014    ETOH abuse    Hepatic encephalopathy ([place]) 04/23/2017   Hepatomegaly    scope 2014   History of breast cancer 06/07/2016   Hypertension    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   Pneumonia    Rotator cuff syndrome of left shoulder    Sleep apnea    does not wear CPAP   Steal syndrome as complication of dialysis access ([place])    Thickened endometrium 06/19/2016   Will get endo biopsy    Wears glasses     Past Surgical History:  Procedure Laterality Date   AV FISTULA PLACEMENT Left 05/22/2017   Procedure: BRACHIAL ARTERY TO [place];  Surgeon: Elam Dutch, MD;  Location: [place];  Service: Vascular;  Laterality: Left;   [place] Left 08/07/2017   Procedure: BASILIC VEIN TRANSPOSITION SECOND STAGE;  Surgeon: Elam Dutch, MD;  Location: [facility] OR;  Service: Vascular;  Laterality: Left;   BRAIN SURGERY     aneurysm at [place] Right    cancer- lunmmnpectomy-    CATARACT EXTRACTION Bilateral    [facility] 2 or 3 years ago   CHOLECYSTECTOMY     COLONOSCOPY N/A 04/10/2013   Procedure: COLONOSCOPY;  Surgeon: Rogene Houston, MD;  [facility];  Service: Endoscopy;  Laterality: N/A;  1030-rescheduled to 1200 Ann notified pt   COLONOSCOPY N/A 08/07/2018   Procedure: COLONOSCOPY;  Surgeon: Rogene Houston, MD;  [facility];  Service: Endoscopy;  Laterality: N/A;  930   ESOPHAGOGASTRODUODENOSCOPY N/A 04/22/2015   Procedure: ESOPHAGOGASTRODUODENOSCOPY (EGD);  Surgeon: Rogene Houston, MD;  [facility];  Service: Endoscopy;  Laterality: N/A;   EYE SURGERY     IR GENERIC HISTORICAL  07/04/2016   IR RADIOLOGIST EVAL & MGMT 07/04/2016 Jacqulynn Cadet, MD [facility] INTERV RAD   IR RADIOLOGIST EVAL & MGMT  07/24/2017   KIDNEY SURGERY     KNEE ARTHROSCOPY Left    LIVER TRANSPLANT     POLYPECTOMY  08/07/2018   Procedure:  POLYPECTOMY;  Surgeon: Rogene Houston, MD;  [facility];  Service: Endoscopy;;  colon    RADIOLOGY WITH ANESTHESIA N/A 07/16/2015   Procedure: TIPS;  Surgeon: Medication Radiologist, MD;  Location: [place];  Service: Radiology;  Laterality: N/A;   REVISON OF ARTERIOVENOUS FISTULA Left 01/06/2019   Procedure: LEFT ARM LIGATION OF ARTERIOVENOUS FISTULA;  Surgeon: Elam Dutch, MD;  Location: [facility] OR;  Service: Vascular;  Laterality: Left;   ROTATOR CUFF REPAIR Left    TOTAL HIP ARTHROPLASTY Right 12/09/2021   Procedure: RIGHT TOTAL HIP ARTHROPLASTY ANTERIOR APPROACH;  Surgeon: Mcarthur Rossetti, MD;  Location: [facility] ORS;  Service: Orthopedics;  Laterality: Right;   TOTAL KNEE ARTHROPLASTY Right    right. 2002    Family History  Problem Relation Age of Onset   Aneurysm Mother  Other Daughter        knee replacement   Hypertension Daughter     Allergies as of 12/28/2022 - Review Complete 12/28/2022  Allergen Reaction Noted   Latex Swelling and Other (See Comments) 07/15/2012   Vicodin [hydrocodone-acetaminophen] Itching 09/06/2012    Social History   Socioeconomic History   Marital status: Widowed    Spouse name: Not on file   Number of children: Not on file   Years of education: Not on file   Highest education level: Not on file  Occupational History   Occupation: disabled  Tobacco Use   Smoking status: Never   Smokeless tobacco: Never  Vaping Use   Vaping Use: Never used  Substance and Sexual Activity   Alcohol use: No    Alcohol/week: 0.0 standard drinks of alcohol    Comment: none since 03/2015   Drug use: Never   Sexual activity: Not Currently    Birth control/protection: Post-menopausal, Abstinence  Other Topics Concern   Not on file  Social History Narrative   Patient is primary caregiver for a wheelchair ridden husband who is a bilateral amputee. However he is fairly capable of taking care of himself at home. This couple have an attentive  daughter who keeps an eye on both of them.   Social Determinants of Health   Financial Resource Strain: Low Risk  (11/20/2022)   Overall Financial Resource Strain (CARDIA)    Difficulty of Paying Living Expenses: Not hard at all  Food Insecurity: No Food Insecurity (11/20/2022)   Hunger Vital Sign    Worried About Running Out of Food in the Last Year: Never true    Ran Out of Food in the Last Year: Never true  Transportation Needs: No Transportation Needs (11/20/2022)   PRAPARE - Hydrologist (Medical): No    Lack of Transportation (Non-Medical): No  Physical Activity: Sufficiently Active (11/20/2022)   Exercise Vital Sign    Days of Exercise per Week: 7 days    Minutes of Exercise per Session: 70 min  Stress: No Stress Concern Present (11/20/2022)   [place]    Feeling of Stress : Only a little  Social Connections: Moderately Isolated (11/20/2022)   Social Connection and Isolation Panel [NHANES]    Frequency of Communication with Friends and Family: More than three times a week    Frequency of Social Gatherings with Friends and Family: Once a week    Attends Religious Services: More than 4 times per year    Active Member of [employer] or Organizations: No    Attends Archivist Meetings: Never    Marital Status: Widowed  Intimate Partner Violence: Not At Risk (11/20/2022)   Humiliation, Afraid, Rape, and Kick questionnaire    Fear of Current or Ex-Partner: No    Emotionally Abused: No    Physically Abused: No    Sexually Abused: No     Review of Systems   Gen: Denies any fever, chills, fatigue, weight loss, lack of appetite.  CV: Denies chest pain, heart palpitations, peripheral edema, syncope.  Resp: Denies shortness of breath at rest or with exertion. Denies wheezing or cough.  GI: see HPI GU : Denies urinary burning, urinary frequency, urinary hesitancy MS: Denies joint pain,  muscle weakness, cramps, or limitation of movement.  Derm: Denies rash, itching, dry skin Psych: Denies depression, anxiety, memory loss, and confusion Heme: Denies bruising, bleeding, and enlarged lymph nodes.  Physical Exam   BP 118/78 (BP Location: Right Arm, Patient Position: Sitting, Cuff Size: Normal)   Pulse 60   Temp (!) 97.5 F (36.4 C) (Temporal)   Ht '5\' 4"'$  (1.626 m)   Wt 184 lb 6.4 oz (83.6 kg)   SpO2 96%   BMI 31.65 kg/m   General:   Alert and oriented. Pleasant and cooperative. Well-nourished and well-developed.  Head:  Normocephalic and atraumatic. Eyes:  Without icterus, sclera clear and conjunctiva pink.  Ears:  Normal auditory acuity. Mouth:  No deformity or lesions, oral mucosa pink.  Lungs:  Clear to auscultation bilaterally. No wheezes, rales, or rhonchi. No distress.  Heart:  S1, S2 present without murmurs appreciated.  Abdomen:  +BS, soft, non-tender and non-distended. No HSM noted. No guarding or rebound. No masses appreciated.  Rectal:  Deferred Msk:  Symmetrical without gross deformities. Normal posture. Extremities:  Without edema. Neurologic:  Alert and  oriented x4;  grossly normal neurologically. Skin:  Intact without significant lesions or rashes. Psych:  Alert and cooperative. Normal mood and affect.   Assessment   ATZIN SYLVIS is a 67 y.o. female with a history of alcoholic cirrhosis s/p liver-kidney transplant in 2018, HTN, degenerative disc disease, breast cancer in 2002 presenting today for evaluation of prior rectal bleeding/hospital follow-up.  Rectal bleeding, hemorrhoids: Had about 16 hours of painless rectal bleeding with clots in December 2023. Went to ED at [facility] and had stable hgb. Underwent colonoscopy with inadequate prep as outlined in HPI. No further bleeding since hospitalization. Denies any upper or lower GI symptoms. Has continued on daily PPI.   History of colon polyps: Last coloscopy in 2019 with pan-colonic  diverticulosis and single hyperplastic polyp but recommended repeat in 5 years.  Given recent episode of rectal bleeding and inadequate prep, repeat colonoscopy for screening/surveillance recommended. Patient has agreed to proceed with scheduling colonoscopy. Will augment prep with Linzess to ensure adequate visualization.   PLAN   Proceed with colonoscopy with propofol by Dr. Gala Romney in near future: the risks, benefits, and alternatives have been discussed with the patient in detail. The patient states understanding and desires to proceed. ASA 3 Linzess 145 mcg daily for 3 days prior to procedure   Venetia Night, MSN, FNP-BC, AGACNP-BC [facility]

## 2022-12-28 ENCOUNTER — Encounter: Payer: Self-pay | Admitting: *Deleted

## 2022-12-28 ENCOUNTER — Encounter: Payer: Self-pay | Admitting: Gastroenterology

## 2022-12-28 ENCOUNTER — Encounter: Payer: Self-pay | Admitting: Radiology

## 2022-12-28 ENCOUNTER — Other Ambulatory Visit: Payer: Self-pay | Admitting: *Deleted

## 2022-12-28 ENCOUNTER — Ambulatory Visit (INDEPENDENT_AMBULATORY_CARE_PROVIDER_SITE_OTHER): Payer: 59 | Admitting: Gastroenterology

## 2022-12-28 VITALS — BP 118/78 | HR 60 | Temp 97.5°F | Ht 64.0 in | Wt 184.4 lb

## 2022-12-28 DIAGNOSIS — Z86010 Personal history of colonic polyps: Secondary | ICD-10-CM

## 2022-12-28 DIAGNOSIS — Z8719 Personal history of other diseases of the digestive system: Secondary | ICD-10-CM | POA: Diagnosis not present

## 2022-12-28 DIAGNOSIS — K649 Unspecified hemorrhoids: Secondary | ICD-10-CM

## 2022-12-28 DIAGNOSIS — K5731 Diverticulosis of large intestine without perforation or abscess with bleeding: Secondary | ICD-10-CM | POA: Diagnosis not present

## 2022-12-28 MED ORDER — PEG 3350-KCL-NA BICARB-NACL 420 G PO SOLR: 4000.0000 mL | Freq: Once | ORAL | 0 refills | Status: DC

## 2022-12-28 NOTE — Patient Instructions (Signed)
We will get you scheduled for a colonoscopy in the near future with Dr. Gala Romney.  I am providing you with samples of Linzess for you to take 1 daily for 3 days prior to your colonoscopy to ensure good prep.  It was a pleasure to see you today. I want to create trusting relationships with patients. If you receive a survey regarding your visit,  I greatly appreciate you taking time to fill this out on paper or through your MyChart. I value your feedback.  Venetia Night, MSN, FNP-BC, AGACNP-BC [facility]

## 2022-12-29 ENCOUNTER — Telehealth: Payer: Self-pay | Admitting: *Deleted

## 2022-12-29 NOTE — Telephone Encounter (Signed)
Pt informed that pre-op date is on 3/25/4, Monday at 10:30 am at [facility]. Verbalized understanding

## 2022-12-31 DIAGNOSIS — Z940 Kidney transplant status: Principal | ICD-10-CM

## 2022-12-31 DIAGNOSIS — D849 Immunodeficiency, unspecified: Principal | ICD-10-CM

## 2022-12-31 DIAGNOSIS — Z48298 Encounter for aftercare following other organ transplant: Principal | ICD-10-CM

## 2022-12-31 DIAGNOSIS — N042 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: Principal | ICD-10-CM

## 2022-12-31 DIAGNOSIS — N022 Recurrent and persistent hematuria with diffuse membranous glomerulonephritis: Principal | ICD-10-CM

## 2023-01-06 DIAGNOSIS — K219 Gastro-esophageal reflux disease without esophagitis: Secondary | ICD-10-CM | POA: Diagnosis not present

## 2023-01-06 DIAGNOSIS — E785 Hyperlipidemia, unspecified: Secondary | ICD-10-CM | POA: Diagnosis not present

## 2023-01-09 DIAGNOSIS — M129 Arthropathy, unspecified: Secondary | ICD-10-CM | POA: Diagnosis not present

## 2023-01-09 DIAGNOSIS — M545 Low back pain, unspecified: Secondary | ICD-10-CM | POA: Diagnosis not present

## 2023-01-09 DIAGNOSIS — Z79899 Other long term (current) drug therapy: Secondary | ICD-10-CM | POA: Diagnosis not present

## 2023-01-10 DIAGNOSIS — R799 Abnormal finding of blood chemistry, unspecified: Principal | ICD-10-CM

## 2023-01-10 DIAGNOSIS — Z940 Kidney transplant status: Principal | ICD-10-CM

## 2023-01-10 DIAGNOSIS — R8279 Other abnormal findings on microbiological examination of urine: Principal | ICD-10-CM

## 2023-01-10 DIAGNOSIS — D849 Immunodeficiency, unspecified: Principal | ICD-10-CM

## 2023-01-10 NOTE — Unmapped (Signed)
[facility] orders updated

## 2023-01-11 DIAGNOSIS — Z79899 Other long term (current) drug therapy: Secondary | ICD-10-CM | POA: Diagnosis not present

## 2023-01-12 ENCOUNTER — Ambulatory Visit: Admit: 2023-01-12 | Discharge: 2023-01-13 | Payer: MEDICARE | Attending: Nephrology | Primary: Nephrology

## 2023-01-12 ENCOUNTER — Ambulatory Visit: Admit: 2023-01-12 | Discharge: 2023-01-13 | Payer: MEDICARE

## 2023-01-12 DIAGNOSIS — D849 Immunodeficiency, unspecified: Secondary | ICD-10-CM | POA: Diagnosis not present

## 2023-01-12 DIAGNOSIS — M25561 Pain in right knee: Secondary | ICD-10-CM | POA: Diagnosis not present

## 2023-01-12 DIAGNOSIS — I10 Essential (primary) hypertension: Secondary | ICD-10-CM | POA: Diagnosis not present

## 2023-01-12 DIAGNOSIS — I5032 Chronic diastolic (congestive) heart failure: Secondary | ICD-10-CM | POA: Diagnosis not present

## 2023-01-12 DIAGNOSIS — Z79899 Other long term (current) drug therapy: Secondary | ICD-10-CM | POA: Diagnosis not present

## 2023-01-12 DIAGNOSIS — N062 Isolated proteinuria with diffuse membranous glomerulonephritis, unspecified: Secondary | ICD-10-CM | POA: Diagnosis not present

## 2023-01-12 DIAGNOSIS — Z96641 Presence of right artificial hip joint: Secondary | ICD-10-CM | POA: Diagnosis not present

## 2023-01-12 DIAGNOSIS — Z48298 Encounter for aftercare following other organ transplant: Secondary | ICD-10-CM | POA: Diagnosis not present

## 2023-01-12 DIAGNOSIS — Z4822 Encounter for aftercare following kidney transplant: Secondary | ICD-10-CM | POA: Diagnosis not present

## 2023-01-12 DIAGNOSIS — Z7982 Long term (current) use of aspirin: Secondary | ICD-10-CM | POA: Diagnosis not present

## 2023-01-12 DIAGNOSIS — N022 Recurrent and persistent hematuria with diffuse membranous glomerulonephritis: Secondary | ICD-10-CM | POA: Diagnosis not present

## 2023-01-12 DIAGNOSIS — Z79621 Long term (current) use of calcineurin inhibitor: Secondary | ICD-10-CM | POA: Diagnosis not present

## 2023-01-12 DIAGNOSIS — M25562 Pain in left knee: Secondary | ICD-10-CM | POA: Diagnosis not present

## 2023-01-12 DIAGNOSIS — D509 Iron deficiency anemia, unspecified: Secondary | ICD-10-CM | POA: Diagnosis not present

## 2023-01-12 DIAGNOSIS — D472 Monoclonal gammopathy: Secondary | ICD-10-CM | POA: Diagnosis not present

## 2023-01-12 DIAGNOSIS — Z944 Liver transplant status: Secondary | ICD-10-CM | POA: Diagnosis not present

## 2023-01-12 DIAGNOSIS — Z940 Kidney transplant status: Secondary | ICD-10-CM | POA: Diagnosis not present

## 2023-01-12 DIAGNOSIS — N1832 Anemia of chronic renal failure, stage 3b (CMS-HCC): Principal | ICD-10-CM

## 2023-01-12 DIAGNOSIS — R799 Abnormal finding of blood chemistry, unspecified: Principal | ICD-10-CM

## 2023-01-12 DIAGNOSIS — D631 Anemia in chronic kidney disease: Principal | ICD-10-CM

## 2023-01-12 DIAGNOSIS — R8279 Other abnormal findings on microbiological examination of urine: Principal | ICD-10-CM

## 2023-01-12 DIAGNOSIS — N042 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: Principal | ICD-10-CM

## 2023-01-12 LAB — CBC W/ AUTO DIFF
BASOPHILS ABSOLUTE COUNT: 0 10*9/L (ref 0.0–0.1)
BASOPHILS RELATIVE PERCENT: 0.6 %
EOSINOPHILS ABSOLUTE COUNT: 0.4 10*9/L (ref 0.0–0.5)
EOSINOPHILS RELATIVE PERCENT: 5 %
HEMATOCRIT: 36.9 % (ref 34.0–44.0)
HEMOGLOBIN: 11.8 g/dL (ref 11.3–14.9)
LYMPHOCYTES ABSOLUTE COUNT: 1.6 10*9/L (ref 1.1–3.6)
LYMPHOCYTES RELATIVE PERCENT: 19.1 %
MEAN CORPUSCULAR HEMOGLOBIN CONC: 32 g/dL (ref 32.0–36.0)
MEAN CORPUSCULAR HEMOGLOBIN: 24.2 pg — ABNORMAL LOW (ref 25.9–32.4)
MEAN CORPUSCULAR VOLUME: 75.7 fL — ABNORMAL LOW (ref 77.6–95.7)
MEAN PLATELET VOLUME: 9.1 fL (ref 6.8–10.7)
MONOCYTES ABSOLUTE COUNT: 0.9 10*9/L — ABNORMAL HIGH (ref 0.3–0.8)
MONOCYTES RELATIVE PERCENT: 11.1 %
NEUTROPHILS ABSOLUTE COUNT: 5.3 10*9/L (ref 1.8–7.8)
NEUTROPHILS RELATIVE PERCENT: 64.2 %
PLATELET COUNT: 276 10*9/L (ref 150–450)
RED BLOOD CELL COUNT: 4.87 10*12/L (ref 3.95–5.13)
RED CELL DISTRIBUTION WIDTH: 15.9 % — ABNORMAL HIGH (ref 12.2–15.2)
WBC ADJUSTED: 8.3 10*9/L (ref 3.6–11.2)

## 2023-01-12 LAB — URINALYSIS WITH MICROSCOPY
GLUCOSE UA: NEGATIVE
KETONES UA: NEGATIVE
NITRITE UA: NEGATIVE
PH UA: 6 (ref 5.0–9.0)
PROTEIN UA: 100 — AB
RBC UA: 8 /HPF — ABNORMAL HIGH (ref <4)
SPECIFIC GRAVITY UA: >=1.03 (ref 1.005–1.030)
SQUAMOUS EPITHELIAL: >100 /HPF — ABNORMAL HIGH (ref 0–5)
TRANSITIONAL EPITHELIAL: 1 /HPF (ref 0–2)
UROBILINOGEN UA: 1
WBC UA: 64 /HPF — ABNORMAL HIGH (ref 0–5)

## 2023-01-12 LAB — HEMOGLOBIN A1C
ESTIMATED AVERAGE GLUCOSE: 117 mg/dL
HEMOGLOBIN A1C: 5.7 % — ABNORMAL HIGH (ref 4.8–5.6)

## 2023-01-12 LAB — IRON PANEL
IRON SATURATION: 9 % — ABNORMAL LOW (ref 20–55)
IRON: 26 ug/dL — ABNORMAL LOW
TOTAL IRON BINDING CAPACITY: 302 ug/dL (ref 250–425)

## 2023-01-12 LAB — IRON & TIBC
IRON SATURATION: 9 % — ABNORMAL LOW (ref 20–55)
IRON: 28 ug/dL — ABNORMAL LOW
TOTAL IRON BINDING CAPACITY: 298 ug/dL (ref 250–425)

## 2023-01-12 LAB — LIPID PANEL
CHOLESTEROL/HDL RATIO SCREEN: 3.3 (ref 1.0–4.5)
CHOLESTEROL: 127 mg/dL (ref <=200)
HDL CHOLESTEROL: 39 mg/dL — ABNORMAL LOW (ref 40–60)
LDL CHOLESTEROL CALCULATED: 69 mg/dL (ref 40–99)
NON-HDL CHOLESTEROL: 88 mg/dL (ref 70–130)
TRIGLYCERIDES: 95 mg/dL (ref 0–150)
VLDL CHOLESTEROL CAL: 19 mg/dL (ref 11–41)

## 2023-01-12 LAB — COMPREHENSIVE METABOLIC PANEL
ALBUMIN: 3.4 g/dL (ref 3.4–5.0)
ALKALINE PHOSPHATASE: 89 U/L (ref 46–116)
ALT (SGPT): 18 U/L (ref 10–49)
ANION GAP: 6 mmol/L (ref 5–14)
AST (SGOT): 17 U/L (ref <=34)
BILIRUBIN TOTAL: 0.4 mg/dL (ref 0.3–1.2)
BLOOD UREA NITROGEN: 13 mg/dL (ref 9–23)
BUN / CREAT RATIO: 23
CALCIUM: 8.8 mg/dL (ref 8.7–10.4)
CHLORIDE: 109 mmol/L — ABNORMAL HIGH (ref 98–107)
CO2: 27.9 mmol/L (ref 20.0–31.0)
CREATININE: 0.56 mg/dL
EGFR CKD-EPI (2021) FEMALE: >90 mL/min/{1.73_m2} (ref >=60)
GLUCOSE RANDOM: 125 mg/dL — ABNORMAL HIGH (ref 70–99)
POTASSIUM: 3.7 mmol/L (ref 3.4–4.8)
PROTEIN TOTAL: 6.7 g/dL (ref 5.7–8.2)
SODIUM: 143 mmol/L (ref 135–145)

## 2023-01-12 LAB — ALBUMIN / CREATININE URINE RATIO
ALBUMIN QUANT URINE: 71 mg/dL
ALBUMIN/CREATININE RATIO: 254.1 ug/mg — ABNORMAL HIGH (ref 0.0–30.0)
CREATININE, URINE: 279.4 mg/dL

## 2023-01-12 LAB — TACROLIMUS LEVEL, TROUGH: TACROLIMUS, TROUGH: 4.6 ng/mL — ABNORMAL LOW (ref 5.0–15.0)

## 2023-01-12 LAB — IGG: GAMMAGLOBULIN; IGG: 1000 mg/dL (ref 650–1600)

## 2023-01-12 LAB — CMV DNA, QUANTITATIVE, PCR: CMV VIRAL LD: NOT DETECTED

## 2023-01-12 LAB — PARATHYROID HORMONE (PTH): PARATHYROID HORMONE INTACT: 87.3 pg/mL (ref 18.5–88.1)

## 2023-01-12 LAB — FERRITIN
FERRITIN: 18.3 ng/mL
FERRITIN: 18.4 ng/mL

## 2023-01-12 LAB — MAGNESIUM: MAGNESIUM: 1.5 mg/dL — ABNORMAL LOW (ref 1.6–2.6)

## 2023-01-12 LAB — PROTEIN / CREATININE RATIO, URINE
CREATININE, URINE: 279.9 mg/dL
PROTEIN URINE: 147.8 mg/dL
PROTEIN/CREAT RATIO, URINE: 0.528

## 2023-01-12 LAB — PHOSPHORUS: PHOSPHORUS: 2.9 mg/dL (ref 2.4–5.1)

## 2023-01-12 LAB — IRON: IRON: 26 ug/dL — ABNORMAL LOW

## 2023-01-12 LAB — BK VIRUS QUANTITATIVE PCR, BLOOD: BK BLOOD RESULT: NOT DETECTED

## 2023-01-12 NOTE — Unmapped (Signed)
I spent 10 minutes with the patient at her clinic appt. I reviewed and updated her medications. She took her envarsus at 9am yesterday morning. She denies any NVD, swelling, dizziness, or heartburn. She is getting her next ritux dose 3/22, she will only get one dose.

## 2023-01-12 NOTE — Unmapped (Addendum)
Transplant Nephrology Clinic Visit    Assessment and Plan  Michelle Travis is a 67 y.o. 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 She has recurrent PLA2R positive membranous nephropathy and is seen for follow up of her transplant and associated medical problems.     Status post kidney transplant with membranous nephropathy recurrence.  - Kidney biopsy 03/11/21 with PLA2R positive membranous nephropathy  - Initial treatment included Rituximab 1 gm on 03/23/21 and 1 gm on 04/07/21 and 125 mg solumedrol x 2 given with the doses of Rituximab.   - Recurrence of nephrotic range proteinuria treated with Rituximab 1 gm 02/07/22, 03/07/22 and 07/20/22.   - UPC after biopsy peaked at 5.02 on 03/23/21 improved to 0.623 on 07/20/21 only to return to nephrotic range. UPC today is down to 0.528 from a peak of 8.66 on 02/07/22.     - Serum is stable at 0.56 mg/dL (baseline < 0.8 mg/dL)  - Will plan another dose of rituximab 01/19/23 with further dosing determined by clinical response.  - Patient at increased risk of DVT in view of membranous nephropathy recurrence. Albumin today is 3.4.  Exam does not suggest DVT. I have educated her about the signs/symptoms of DVT/PE. Will continue aspirin.     S/P Liver transplant with excellent liver function.   - LFTs normal today  - She will continue to follow with the liver transplant team.     Immunosuppression.   - Tacrolimus level is pending (target 3-6).   - Continue Envarsus 2.5 mg daily   - Continue Myfortic 360 mg BID.    GI bleed, 09/2022  - Likely due to diverticular source, s/p colonoscopy 10/26/22 with multiple diverticuli  - Microcytosis is present on today's CBC. Iron saturation % today is 9%.   - Repeat colonoscopy 01/25/23  - Will plan IV iron.     BP Management  - BP 107//72  - Currently on no antihypertensive therapy.    S/P right total hip replacement, 11/2021  - She will follow up with ortho clinic.    Bilateral knee pain.   - I have previously suggested planned possible TKR be delayed until the response to membranous recurrence treatment can be fully evaluated. This is predominantly related to concern about post-op DVT. With improvement in proteinuria and serum albumin level she can once again engage her ortho team regarding timing for possible knee surgery.     History of IgG lambda monoclonal gammopathy.   - SPEP with immunofixation 03/19/20 revealed no monoclonal protein. Kidney biopsy revealed no evidence of MGRS.  - Will recheck SPEP with immunofixation    History of sleep apnea.   - She is no longer on CPAP.  - She denies daytime drowsiness.     Immunizations.  -  Prevnar-20 07/20/21.  - Flu vaccine 07/27/22  - Shingrix x 2 completed  - COVID-19  spikevax booster 10/11/22  - RSV vaccine  01/02/23    Cancer screening.   - Mammography 05/29/22 negative, has remote history of breast cancer.  - PAP smear 11/20/22 negative for malignancy.  - Colonoscopy 10/26/22 with diverticulosis. Repeat scheduled for 01/25/23    Follow-up.   Patient will return to clinic in 3 months.   Monthly labs including UPC  Will plan IV iron administration  Rituximab 1 gm 01/19/23    History of Present Illness  Michelle Travis is a 67 y.o. female who is s/p kidney and liver transplant on 09/13/17 for alcoholic cirrhosis  and PLA2R positive membranous nephropathy.  She was found to have post-transplant proteinuria (UPC 2.31 and 5.02) and a kidney biopsy 03/11/21 revealed recurrent membranous nephropathy. She received 2 doses of Rituximab 1 gm on 03/23/21 and 04/07/21 with subsequent improvement in proteinuria to 0.623 on 07/20/21. UPC increased to 6.94 on 01/27/22 and she was treated with 1 gm rituximab again on 02/07/22 and 03/07/22 and 07/20/22.     She was hospitalized 10/25/22-10/27/22 with rectal bleeding. Colonoscopy on 10/26/22 revealed sigmoid, hepatic flexure, and ascending colon diverticulosis. She is scheduled for a repeat study later this month and denies recurrence of bleeding since her hospitalization.    She denies nausea, vomiting, diarrhea, fever, chills, abdominal pain, dysuria, allograft tenderness, headaches, tremors, chest pain, shortness of breath, or edema. She has not been checking BPs.     Left knee knee pain is still present. Surgery has been postponed until membranous nephropathy is in a lower risk phase or in remission.     Immunosuppression is Myfortic 360 mg bid and Envarsus 2.5 mg daily.   Last dose of Envarsus: 9 AM yesterday.    Transplant History:  1. 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 S/P kidney and liver transplants on 09/13/17 at [facility]. Kidney KDPI 26%. CMV D+/R+; EBV D-/R+, hep C Ab +/NAT- (recipient hep C Ab negative)  4. Complicated by rectus sheath hematoma  5. Delayed graft function requiring hemodialysis sessions following transplant (stopped dialysis 11/26/018).  6. Immunosuppression was ??? induction followed by tacrolimus and myfortic maintenance therapy.   7. Baseline creatinine <1.0 mg/dL.  8. Kidney biopsy 03/11/21 with recurrent PLA2R positive membranous nephropathy, treated with Rituximab 1 gm x 4 (03/23/21, 04/07/21, 02/07/22, 03/07/22)   9. Rotavirus infection 04/2022 with AKI secondary to volume depletion    Past Medical History:  1. Alcoholic cirrhosis, S/P TIPS  2. Membranous nephropathy, PLA2R positive biopsy 08/30/2016, treated solumedrol initially with little improvement then with Rituximab (10/2016 and 03/2017).   3. S/P kidney and liver transplants as stated above  4. History of breast cancer, in remission for 17 years, s/p lumpectomy.  5. History of IGG monoclonal gammopathy, IgG lambda. Kidney biopsy 08/30/2016 without MGRS.  6. S/p cerebral aneurysm repair.  7. S/P total knee replacement, right   8. Essential HTN prior to ESLD  9. History of diverticulitis  10. Recurrent pleural effusion, history of pneumonia x 3  11. Osteopenia by DEXA 06/14/2016  12. Hypoattenuating thyroid nodule noted on chest CT 11/11/2016  13. Sleep apnea    Review of Systems    All other systems are reviewed and are negative.    Medications    Current Outpatient Medications   Medication Sig Dispense Refill    acetaminophen (TYLENOL 8 HOUR) 650 MG CR tablet Take 1 tablet (650 mg total) by mouth every four (4) hours as needed for pain. 30 tablet 0    atorvastatin (LIPITOR) 10 MG tablet Take 1 tablet (10 mg total) by mouth daily. 90 tablet 3    buprenorphine (BELBUCA) 150 mcg buccal film Apply 1 Film to cheek at bedtime.      cholecalciferol, vitamin D3 25 mcg, 1,000 units,, 1,000 unit (25 mcg) tablet Take 1 tablet (25 mcg total) by mouth in the morning. 90 tablet 3    ENVARSUS XR 1 mg Tb24 extended release tablet Take 1 tablet (1 mg total) by mouth in the morning. Take with TWO  0.75 mg tablets (1.5 mg total) by mouth in the morning for a total of 2.5 mg.. 90 tablet 3    mycophenolate (MYFORTIC) 180 MG EC tablet Take 2 tablets (360 mg total) by mouth two (2) times a day. 360 tablet 3    oxyCODONE (ROXICODONE) 10 mg immediate release tablet TAKE 1 (ONE) TABLET BY MOUTH FOUR TIMES DAILY, AS NEEDED      pantoprazole (PROTONIX) 40 MG tablet Take 1 tablet (40 mg total) by mouth daily. 90 tablet 3    tacrolimus (ENVARSUS XR) 0.75 mg Tb24 extended release tablet Take 2 tablets (1.5 mg total) by mouth in the morning. Take with ONE 1 mg tablet by mouth in the morning for a total of 2.5 mg.. 180 tablet 3     No current facility-administered medications for this visit.       Physical Exam    BP 107/72 (BP Site: R Arm, BP Position: Sitting, BP Cuff Size: Medium)  - Pulse 65  - Temp 36.4 ??C (97.6 ??F) (Temporal)  - Ht 162.6 cm (5' 4)  - Wt 81.6 kg (180 lb)  - LMP  (LMP Unknown)  - BMI 30.90 kg/m??   General: Patient is a pleasant female in no apparent distress.  Eyes: Sclera anicteric.  Neck: Supple without LAD/JVD/bruits.  Lungs: Clear to auscultation bilaterally, no wheezes/rales/rhonchi.  Cardiovascular: Regular rate and rhythm without murmurs, rubs or gallops.  Abdomen: Soft, notender/nondistended. Positive bowel sounds. No hepatosplenomegaly, masses or bruits appreciated.   Extremities: no edema  Skin: Without rash  Neurological: Grossly nonfocal.  Psychiatric: Mood and affect appropriate.    Laboratory Results and Imaging Reviewed

## 2023-01-15 DIAGNOSIS — Z79899 Other long term (current) drug therapy: Principal | ICD-10-CM

## 2023-01-15 DIAGNOSIS — Z940 Kidney transplant status: Principal | ICD-10-CM

## 2023-01-15 DIAGNOSIS — Z1159 Encounter for screening for other viral diseases: Principal | ICD-10-CM

## 2023-01-15 LAB — SERUM PROTEIN ELECTROPHORESIS AND IMMUNOFIXATION
ALBUMIN (SPE): 3.4 g/dL — ABNORMAL LOW (ref 3.5–5.0)
ALPHA-1 GLOBULIN: 0.5 g/dL (ref 0.2–0.5)
ALPHA-2 GLOBULIN: 0.8 g/dL (ref 0.5–1.1)
BETA-1 GLOBULIN: 0.5 g/dL (ref 0.3–0.6)
BETA-2 GLOBULIN: 0.3 g/dL (ref 0.2–0.6)
GAMMAGLOBULIN: 0.8 g/dL (ref 0.5–1.5)
PROTEIN TOTAL (SPECIAL CHEM): 6.2 g/dL

## 2023-01-16 LAB — HLA DS POST TRANSPLANT
ANTI-DONOR HLA-A #1 MFI: 4 MFI
ANTI-DONOR HLA-A #2 MFI: 43 MFI
ANTI-DONOR HLA-B #1 MFI: 0 MFI
ANTI-DONOR HLA-B #2 MFI: 48 MFI
ANTI-DONOR HLA-C #1 MFI: 0 MFI
ANTI-DONOR HLA-DP #2 MFI: 142 MFI
ANTI-DONOR HLA-DQB #1 MFI: 277 MFI
ANTI-DONOR HLA-DQB #2 MFI: 0 MFI
ANTI-DONOR HLA-DR #1 MFI: 146 MFI
ANTI-DONOR HLA-DR #2 MFI: 44 MFI

## 2023-01-16 LAB — FSAB CLASS 1 ANTIBODY SPECIFICITY: HLA CLASS 1 ANTIBODY RESULT: NEGATIVE

## 2023-01-16 LAB — FSAB CLASS 2 ANTIBODY SPECIFICITY: HLA CL2 AB RESULT: NEGATIVE

## 2023-01-17 DIAGNOSIS — N048 Nephrotic syndrome with other morphologic changes: Principal | ICD-10-CM

## 2023-01-17 DIAGNOSIS — N022 Recurrent and persistent hematuria with diffuse membranous glomerulonephritis: Principal | ICD-10-CM

## 2023-01-17 DIAGNOSIS — N042 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: Principal | ICD-10-CM

## 2023-01-18 ENCOUNTER — Other Ambulatory Visit: Payer: Self-pay | Admitting: *Deleted

## 2023-01-18 LAB — VITAMIN D 1,25 DIHYDROXY: VITAMIN D 1,25-DIHYDROXY: 64 pg/mL

## 2023-01-18 LAB — VITAMIN D 25 HYDROXY: VITAMIN D, TOTAL (25OH): 43.1 ng/mL (ref 20.0–80.0)

## 2023-01-18 MED ORDER — PEG 3350-KCL-NA BICARB-NACL 420 G PO SOLR: 4000.0000 mL | Freq: Once | ORAL | 0 refills | Status: AC

## 2023-01-18 NOTE — Patient Instructions (Signed)
Savannah Jackson  01/18/2023     @PREFPERIOPPHARMACY @   Your procedure is scheduled on  01/24/2023.   Report to [facility] at  [place]  A.M.   Call this number if you have problems the morning of surgery:  [phone]  If you experience any cold or flu symptoms such as cough, fever, chills, shortness of breath, etc. between now and your scheduled surgery, please notify us at the above number.   Remember:  Follow the diet and prep instructions given to you by the office.     Take these medicines the morning of surgery with A SIP OF WATER       envarsus, robaxin (if needed), myfortic, oxycodone(if needed), pantoprazole.     Do not wear jewelry, make-up or nail polish.  Do not wear lotions, powders, or perfumes, or deodorant.  Do not shave 48 hours prior to surgery.  Men may shave face and neck.  Do not bring valuables to the hospital.  [facility] is not responsible for any belongings or valuables.  Contacts, dentures or bridgework may not be worn into surgery.  Leave your suitcase in the car.  After surgery it may be brought to your room.  For patients admitted to the hospital, discharge time will be determined by your treatment team.  Patients discharged the day of surgery will not be allowed to drive home and must have someone with them for 24 hours.    Special instructions:   DO NOT smoke tobacco or vape for 24 hours before your procedure.  Please read over the following fact sheets that you were given. Anesthesia Post-op Instructions and Care and Recovery After Surgery      Colonoscopy, Adult, Care After The following information offers guidance on how to care for yourself after your procedure. Your health care provider may also give you more specific instructions. If you have problems or questions, contact your health care provider. What can I expect after the procedure? After the procedure, it is common to have: A small amount of blood in your stool for  24 hours after the procedure. Some gas. Mild cramping or bloating of your abdomen. Follow these instructions at home: Eating and drinking  Drink enough fluid to keep your urine pale yellow. Follow instructions from your health care provider about eating or drinking restrictions. Resume your normal diet as told by your health care provider. Avoid heavy or fried foods that are hard to digest. Activity Rest as told by your health care provider. Avoid sitting for a long time without moving. Get up to take short walks every 1-2 hours. This is important to improve blood flow and breathing. Ask for help if you feel weak or unsteady. Return to your normal activities as told by your health care provider. Ask your health care provider what activities are safe for you. Managing cramping and bloating  Try walking around when you have cramps or feel bloated. If directed, apply heat to your abdomen as told by your health care provider. Use the heat source that your health care provider recommends, such as a moist heat pack or a heating pad. Place a towel between your skin and the heat source. Leave the heat on for 20-30 minutes. Remove the heat if your skin turns bright red. This is especially important if you are unable to feel pain, heat, or cold. You have a greater risk of getting burned. General instructions If you were given a sedative during  the procedure, it can affect you for several hours. Do not drive or operate machinery until your health care provider says that it is safe. For the first 24 hours after the procedure: Do not sign important documents. Do not drink alcohol. Do your regular daily activities at a slower pace than normal. Eat soft foods that are easy to digest. Take over-the-counter and prescription medicines only as told by your health care provider. Keep all follow-up visits. This is important. Contact a health care provider if: You have blood in your stool 2-3 days after the  procedure. Get help right away if: You have more than a small spotting of blood in your stool. You have large blood clots in your stool. You have swelling of your abdomen. You have nausea or vomiting. You have a fever. You have increasing pain in your abdomen that is not relieved with medicine. These symptoms may be an emergency. Get help right away. Call 911. Do not wait to see if the symptoms will go away. Do not drive yourself to the hospital. Summary After the procedure, it is common to have a small amount of blood in your stool. You may also have mild cramping and bloating of your abdomen. If you were given a sedative during the procedure, it can affect you for several hours. Do not drive or operate machinery until your health care provider says that it is safe. Get help right away if you have a lot of blood in your stool, nausea or vomiting, a fever, or increased pain in your abdomen. This information is not intended to replace advice given to you by your health care provider. Make sure you discuss any questions you have with your health care provider. Document Revised: 06/08/2021 Document Reviewed: 06/08/2021 Elsevier Patient Education  [place] After The following information offers guidance on how to care for yourself after your procedure. Your health care provider may also give you more specific instructions. If you have problems or questions, contact your health care provider. What can I expect after the procedure? After the procedure, it is common to have: Tiredness. Little or no memory about what happened during or after the procedure. Impaired judgment when it comes to making decisions. Nausea or vomiting. Some trouble with balance. Follow these instructions at home: For the time period you were told by your health care provider:  Rest. Do not participate in activities where you could fall or become injured. Do not drive or use  machinery. Do not drink alcohol. Do not take sleeping pills or medicines that cause drowsiness. Do not make important decisions or sign legal documents. Do not take care of children on your own. Medicines Take over-the-counter and prescription medicines only as told by your health care provider. If you were prescribed antibiotics, take them as told by your health care provider. Do not stop using the antibiotic even if you start to feel better. Eating and drinking Follow instructions from your health care provider about what you may eat and drink. Drink enough fluid to keep your urine pale yellow. If you vomit: Drink clear fluids slowly and in small amounts as you are able. Clear fluids include water, ice chips, low-calorie sports drinks, and fruit juice that has water added to it (diluted fruit juice). Eat light and bland foods in small amounts as you are able. These foods include bananas, applesauce, rice, lean meats, toast, and crackers. General instructions  Have a responsible adult stay with you for  the time you are told. It is important to have someone help care for you until you are awake and alert. If you have sleep apnea, surgery and some medicines can increase your risk for breathing problems. Follow instructions from your health care provider about wearing your sleep device: When you are sleeping. This includes during daytime naps. While taking prescription pain medicines, sleeping medicines, or medicines that make you drowsy. Do not use any products that contain nicotine or tobacco. These products include cigarettes, chewing tobacco, and vaping devices, such as e-cigarettes. If you need help quitting, ask your health care provider. Contact a health care provider if: You feel nauseous or vomit every time you eat or drink. You feel light-headed. You are still sleepy or having trouble with balance after 24 hours. You get a rash. You have a fever. You have redness or swelling around  the IV site. Get help right away if: You have trouble breathing. You have new confusion after you get home. These symptoms may be an emergency. Get help right away. Call 911. Do not wait to see if the symptoms will go away. Do not drive yourself to the hospital. This information is not intended to replace advice given to you by your health care provider. Make sure you discuss any questions you have with your health care provider. Document Revised: 03/13/2022 Document Reviewed: 03/13/2022 Elsevier Patient Education  [place].

## 2023-01-18 NOTE — Progress Notes (Signed)
Error

## 2023-01-19 ENCOUNTER — Ambulatory Visit: Admit: 2023-01-19 | Discharge: 2023-01-20 | Payer: MEDICARE

## 2023-01-19 DIAGNOSIS — N048 Nephrotic syndrome with other morphologic changes: Secondary | ICD-10-CM | POA: Diagnosis not present

## 2023-01-19 DIAGNOSIS — N022 Recurrent and persistent hematuria with diffuse membranous glomerulonephritis: Secondary | ICD-10-CM | POA: Diagnosis not present

## 2023-01-19 DIAGNOSIS — N042 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, unspecified: Secondary | ICD-10-CM | POA: Diagnosis not present

## 2023-01-19 LAB — CBC W/ AUTO DIFF
BASOPHILS ABSOLUTE COUNT: 0.1 10*9/L (ref 0.0–0.1)
BASOPHILS RELATIVE PERCENT: 0.8 %
EOSINOPHILS ABSOLUTE COUNT: 0.3 10*9/L (ref 0.0–0.5)
EOSINOPHILS RELATIVE PERCENT: 4.4 %
HEMATOCRIT: 34.9 % (ref 34.0–44.0)
HEMOGLOBIN: 11.2 g/dL — ABNORMAL LOW (ref 11.3–14.9)
LYMPHOCYTES ABSOLUTE COUNT: 1.6 10*9/L (ref 1.1–3.6)
LYMPHOCYTES RELATIVE PERCENT: 25.6 %
MEAN CORPUSCULAR HEMOGLOBIN CONC: 32.2 g/dL (ref 32.0–36.0)
MEAN CORPUSCULAR HEMOGLOBIN: 24.1 pg — ABNORMAL LOW (ref 25.9–32.4)
MEAN CORPUSCULAR VOLUME: 75 fL — ABNORMAL LOW (ref 77.6–95.7)
MEAN PLATELET VOLUME: 8.7 fL (ref 6.8–10.7)
MONOCYTES ABSOLUTE COUNT: 0.6 10*9/L (ref 0.3–0.8)
MONOCYTES RELATIVE PERCENT: 10.4 %
NEUTROPHILS ABSOLUTE COUNT: 3.6 10*9/L (ref 1.8–7.8)
NEUTROPHILS RELATIVE PERCENT: 58.8 %
PLATELET COUNT: 337 10*9/L (ref 150–450)
RED BLOOD CELL COUNT: 4.65 10*12/L (ref 3.95–5.13)
RED CELL DISTRIBUTION WIDTH: 15.7 % — ABNORMAL HIGH (ref 12.2–15.2)
WBC ADJUSTED: 6.1 10*9/L (ref 3.6–11.2)

## 2023-01-19 LAB — SLIDE REVIEW

## 2023-01-19 LAB — PROTEIN / CREATININE RATIO, URINE
CREATININE, URINE: 128.9 mg/dL
PROTEIN URINE: 54.5 mg/dL
PROTEIN/CREAT RATIO, URINE: 0.423

## 2023-01-19 MED ADMIN — methylPREDNISolone sodium succinate (PF) (SOLU-Medrol) injection 125 mg: 125 mg | INTRAVENOUS | @ 13:00:00 | Stop: 2023-01-19

## 2023-01-19 MED ADMIN — riTUXimab-abbs (TRUXIMA) 1,000 mg in sodium chloride (NS) 0.9 % 640 mL IVPB: 1000 mg | INTRAVENOUS | @ 14:00:00 | Stop: 2023-01-19

## 2023-01-19 MED ADMIN — diphenhydrAMINE (BENADRYL) injection 25 mg: 25 mg | INTRAVENOUS | @ 13:00:00 | Stop: 2023-01-19

## 2023-01-19 MED ADMIN — acetaminophen (TYLENOL) tablet 650 mg: 650 mg | ORAL | @ 13:00:00 | Stop: 2023-01-19

## 2023-01-19 MED ADMIN — sodium chloride (NS) 0.9 % infusion: 20 mL/h | INTRAVENOUS | @ 13:00:00 | Stop: 2023-01-19

## 2023-01-19 NOTE — Unmapped (Signed)
1610 Patient arrived to the Transplant Infusion room for Rituximab 1000 mg infusion. Condition: well, Mobility: ambulating, accompanied by relative.   See Flowsheet and MAR for all details of visit.  0821 VS stable.   9604 PIV placed and secured with coban, labs collected and sent; urine collected and sent.  5409 Premedications given.  Today's lab results: WBC 6.1  (must be >2.0 or hold infusion and notify provider) and Platelets 337  (must be > 50 or hold infusion and notify provider).   8119 Rituximab infusion initiated rituximab: rituximab subsequent infusion protocol to start 100 mg/hr, rate increase by 100 mg/hr every 30 minutes as tolerated till maximum rate of 400 mg/hr. Patient educated to notify nursing staff if they experience urticaria, erythema, nausea, shortness of breath, metallic taste in mouth, excessive oral or postnasal drainage and any other changes in status which could be side effects from initiating this medication.  1253 Rituximab infusion completed, pt tolerated infusion well.  1315 VS stable, PIV removed and site secured with coban. Patient left clinic today, condition: well; mobility: ambulating; accompanied by relative.

## 2023-01-22 ENCOUNTER — Encounter (HOSPITAL_COMMUNITY): Payer: Self-pay

## 2023-01-22 ENCOUNTER — Encounter (HOSPITAL_COMMUNITY)
Admission: RE | Admit: 2023-01-22 | Discharge: 2023-01-22 | Disposition: A | Discharge status: Home / Self Care | Payer: 59 | Source: Ambulatory Visit | Attending: Internal Medicine | Admitting: Internal Medicine

## 2023-01-22 VITALS — BP 132/78 | Temp 98.6°F | Resp 18 | Ht 64.0 in | Wt 176.0 lb

## 2023-01-22 DIAGNOSIS — I159 Secondary hypertension, unspecified: Secondary | ICD-10-CM

## 2023-01-22 DIAGNOSIS — Z01810 Encounter for preprocedural cardiovascular examination: Secondary | ICD-10-CM | POA: Insufficient documentation

## 2023-01-24 ENCOUNTER — Ambulatory Visit (HOSPITAL_COMMUNITY): Payer: 59 | Admitting: Anesthesiology

## 2023-01-24 ENCOUNTER — Ambulatory Visit (HOSPITAL_BASED_OUTPATIENT_CLINIC_OR_DEPARTMENT_OTHER): Payer: 59 | Admitting: Anesthesiology

## 2023-01-24 ENCOUNTER — Ambulatory Visit (HOSPITAL_COMMUNITY)
Admission: RE | Admit: 2023-01-24 | Discharge: 2023-01-24 | Disposition: A | Discharge status: Home / Self Care | Payer: 59 | Attending: Internal Medicine | Admitting: Internal Medicine

## 2023-01-24 ENCOUNTER — Encounter (HOSPITAL_COMMUNITY)
Admission: RE | Disposition: A | Discharge status: Home / Self Care | Payer: Self-pay | Source: Home / Self Care | Attending: Internal Medicine

## 2023-01-24 ENCOUNTER — Encounter (HOSPITAL_COMMUNITY): Payer: Self-pay | Admitting: Internal Medicine

## 2023-01-24 DIAGNOSIS — N1832 Chronic kidney disease, stage 3b: Secondary | ICD-10-CM | POA: Insufficient documentation

## 2023-01-24 DIAGNOSIS — J189 Pneumonia, unspecified organism: Secondary | ICD-10-CM | POA: Diagnosis not present

## 2023-01-24 DIAGNOSIS — M1990 Unspecified osteoarthritis, unspecified site: Secondary | ICD-10-CM | POA: Insufficient documentation

## 2023-01-24 DIAGNOSIS — G4730 Sleep apnea, unspecified: Secondary | ICD-10-CM | POA: Insufficient documentation

## 2023-01-24 DIAGNOSIS — K640 First degree hemorrhoids: Secondary | ICD-10-CM

## 2023-01-24 DIAGNOSIS — Z853 Personal history of malignant neoplasm of breast: Secondary | ICD-10-CM | POA: Diagnosis not present

## 2023-01-24 DIAGNOSIS — K7030 Alcoholic cirrhosis of liver without ascites: Secondary | ICD-10-CM | POA: Diagnosis not present

## 2023-01-24 DIAGNOSIS — I129 Hypertensive chronic kidney disease with stage 1 through stage 4 chronic kidney disease, or unspecified chronic kidney disease: Secondary | ICD-10-CM | POA: Diagnosis not present

## 2023-01-24 DIAGNOSIS — K921 Melena: Secondary | ICD-10-CM | POA: Diagnosis not present

## 2023-01-24 DIAGNOSIS — I10 Essential (primary) hypertension: Secondary | ICD-10-CM | POA: Diagnosis not present

## 2023-01-24 DIAGNOSIS — K5730 Diverticulosis of large intestine without perforation or abscess without bleeding: Secondary | ICD-10-CM

## 2023-01-24 DIAGNOSIS — Z1211 Encounter for screening for malignant neoplasm of colon: Secondary | ICD-10-CM

## 2023-01-24 HISTORY — PX: COLONOSCOPY WITH PROPOFOL: SHX5780

## 2023-01-24 SURGERY — COLONOSCOPY WITH PROPOFOL
Anesthesia: General

## 2023-01-24 MED ORDER — STERILE WATER FOR IRRIGATION IR SOLN
Status: DC | PRN
  Administered 2023-01-24: 60 mL

## 2023-01-24 MED ORDER — LIDOCAINE HCL (CARDIAC) PF 100 MG/5ML IV SOSY
PREFILLED_SYRINGE | INTRAVENOUS | Status: DC | PRN
  Administered 2023-01-24: 50 mg via INTRATRACHEAL

## 2023-01-24 MED ORDER — PROPOFOL 10 MG/ML IV BOLUS
INTRAVENOUS | Status: DC | PRN
  Administered 2023-01-24: 20 mg via INTRAVENOUS
  Administered 2023-01-24: 100 mg via INTRAVENOUS
  Administered 2023-01-24: 30 mg via INTRAVENOUS
  Administered 2023-01-24: 50 mg via INTRAVENOUS

## 2023-01-24 MED ORDER — LACTATED RINGERS IV SOLN: INTRAVENOUS | Status: DC

## 2023-01-24 NOTE — Anesthesia Preprocedure Evaluation (Signed)
Anesthesia Evaluation  Patient identified by MRN, date of birth, ID band Patient awake    Reviewed: Allergy & Precautions, H&P , NPO status , Patient's Chart, lab work & pertinent test results  Airway Mallampati: II  TM Distance: >3 FB Neck ROM: Full    Dental no notable dental hx. (+) Missing   Pulmonary sleep apnea , pneumonia   Pulmonary exam normal breath sounds clear to auscultation       Cardiovascular Exercise Tolerance: Good hypertension, Pt. on medications Normal cardiovascular exam Rhythm:Regular Rate:Normal  Left ventricle: The cavity size was normal. Systolic function was    vigorous. The estimated ejection fraction was in the range of 65%    to 70%. Wall motion was normal; there were no regional wall    motion abnormalities. Doppler parameters are consistent with    abnormal left ventricular relaxation (grade 1 diastolic    dysfunction).  - Mitral valve: Severely calcified annulus.  - Tricuspid valve: There was moderate regurgitation.  - Pulmonary arteries: Systolic pressure was mildly increased. PA    peak pressure: 31 mm Hg (S).  - Pericardium, extracardiac: There was a large right pleural    effusion with large echogenic crescent shaped mass, mobile but    adhered to posterior aspect of left atrium. This large pleural    effusion inhibits right atrial free wall resulting in diastolic    invagination. There is respiratory varaition of tricuspid Doppler    inflow.      Neuro/Psych Brain aneurysm  negative psych ROS   GI/Hepatic negative GI ROS,,,(+) Cirrhosis     substance abuse  alcohol useLiver transplant   Endo/Other  negative endocrine ROS    Renal/GU Renal InsufficiencyRenal disease (Kidney transplant)  negative genitourinary   Musculoskeletal  (+) Arthritis , Osteoarthritis,    Abdominal   Peds negative pediatric ROS (+)  Hematology  (+) Blood dyscrasia, anemia   Anesthesia Other  Findings Right breast cancer  Reproductive/Obstetrics negative OB ROS                             Anesthesia Physical Anesthesia Plan  ASA: 3  Anesthesia Plan: General   Post-op Pain Management: Minimal or no pain anticipated   Induction: Intravenous  PONV Risk Score and Plan: Treatment may vary due to age or medical condition  Airway Management Planned: Nasal Cannula and Natural Airway  Additional Equipment:   Intra-op Plan:   Post-operative Plan:   Informed Consent: I have reviewed the patients History and Physical, chart, labs and discussed the procedure including the risks, benefits and alternatives for the proposed anesthesia with the patient or authorized representative who has indicated his/her understanding and acceptance.     Dental advisory given  Plan Discussed with: CRNA and Surgeon  Anesthesia Plan Comments:        Anesthesia Quick Evaluation

## 2023-01-24 NOTE — Interval H&P Note (Signed)
History and Physical Interval Note:  01/24/2023 1:36 PM  Savannah Jackson  has presented today for surgery, with the diagnosis of screening.  The various methods of treatment have been discussed with the patient and family. After consideration of risks, benefits and other options for treatment, the patient has consented to  Procedure(s) with comments: COLONOSCOPY WITH PROPOFOL (N/A) - 12:45 pm, asa 3 as a surgical intervention.  The patient's history has been reviewed, patient examined, no change in status, stable for surgery.  I have reviewed the patient's chart and labs.  Questions were answered to the patient's satisfaction.     Manus Rudd    patient seen and examined in short stay.  No bleeding since episode December.  History of colon polyps colonoscopy incomplete due to a poor prep in December.  Here for complete diagnostic examination per plan. The risks, benefits, limitations, alternatives and imponderables have been reviewed with the patient. Questions have been answered. All parties are agreeable.

## 2023-01-24 NOTE — Discharge Instructions (Signed)
  Colonoscopy Discharge Instructions  Read the instructions outlined below and refer to this sheet in the next few weeks. These discharge instructions provide you with general information on caring for yourself after you leave the hospital. Your doctor may also give you specific instructions. While your treatment has been planned according to the most current medical practices available, unavoidable complications occasionally occur. If you have any problems or questions after discharge, call Dr. Gala Romney at [phone]. ACTIVITY You may resume your regular activity, but move at a slower pace for the next 24 hours.  Take frequent rest periods for the next 24 hours.  Walking will help get rid of the air and reduce the bloated feeling in your belly (abdomen).  No driving for 24 hours (because of the medicine (anesthesia) used during the test).   Do not sign any important legal documents or operate any machinery for 24 hours (because of the anesthesia used during the test).  NUTRITION Drink plenty of fluids.  You may resume your normal diet as instructed by your doctor.  Begin with a light meal and progress to your normal diet. Heavy or fried foods are harder to digest and may make you feel sick to your stomach (nauseated).  Avoid alcoholic beverages for 24 hours or as instructed.  MEDICATIONS You may resume your normal medications unless your doctor tells you otherwise.  WHAT YOU CAN EXPECT TODAY Some feelings of bloating in the abdomen.  Passage of more gas than usual.  Spotting of blood in your stool or on the toilet paper.  IF YOU HAD POLYPS REMOVED DURING THE COLONOSCOPY: No aspirin products for 7 days or as instructed.  No alcohol for 7 days or as instructed.  Eat a soft diet for the next 24 hours.  FINDING OUT THE RESULTS OF YOUR TEST Not all test results are available during your visit. If your test results are not back during the visit, make an appointment with your caregiver to find out the  results. Do not assume everything is normal if you have not heard from your caregiver or the medical facility. It is important for you to follow up on all of your test results.  SEEK IMMEDIATE MEDICAL ATTENTION IF: You have more than a spotting of blood in your stool.  Your belly is swollen (abdominal distention).  You are nauseated or vomiting.  You have a temperature over 101.  You have abdominal pain or discomfort that is severe or gets worse throughout the day.       diverticulosis and hemorrhoids only found today  No polyps.  Your prep was excellent!    Office visit with Venetia Night in 2 months   repeat colonoscopy in 7 years   at patient request, I called Aubriee Azari at [phone]  reviewed findings and recommendations

## 2023-01-24 NOTE — Anesthesia Procedure Notes (Signed)
Date/Time: 01/24/2023 1:42 PM  Performed by: Vista Deck, CRNAPre-anesthesia Checklist: Patient identified, Emergency Drugs available, Suction available, Timeout performed and Patient being monitored Patient Re-evaluated:Patient Re-evaluated prior to induction Oxygen Delivery Method: Nasal Cannula

## 2023-01-24 NOTE — Anesthesia Postprocedure Evaluation (Signed)
Anesthesia Post Note  Patient: Savannah Jackson  Procedure(s) Performed: COLONOSCOPY WITH PROPOFOL  Patient location during evaluation: Short Stay Anesthesia Type: General Level of consciousness: awake and alert Pain management: pain level controlled Vital Signs Assessment: post-procedure vital signs reviewed and stable Respiratory status: spontaneous breathing Cardiovascular status: blood pressure returned to baseline and stable Postop Assessment: no apparent nausea or vomiting Anesthetic complications: no  No notable events documented.   Last Vitals:  Vitals:   01/24/23 1108  BP: 121/79  Pulse: (!) 58  Resp: 19  SpO2: 99%    Last Pain:  Vitals:   01/24/23 1344  PainSc: 0-No pain                 Simone Tuckey

## 2023-01-24 NOTE — Transfer of Care (Signed)
Immediate Anesthesia Transfer of Care Note  Patient: Savannah Jackson  Procedure(s) Performed: COLONOSCOPY WITH PROPOFOL  Patient Location: Short Stay  Anesthesia Type:General  Level of Consciousness: awake  Airway & Oxygen Therapy: Patient Spontanous Breathing  Post-op Assessment: Report given to RN  Post vital signs: Reviewed and stable  Last Vitals:  Vitals Value Taken Time  BP    Temp    Pulse    Resp    SpO2      Last Pain:  Vitals:   01/24/23 1344  PainSc: 0-No pain         Complications: No notable events documented.

## 2023-01-24 NOTE — Op Note (Signed)
[facility] Patient Name: Savannah Jackson Procedure Date: 01/24/2023 1:27 PM MRN: [id number] Date of Birth: [birth year] Attending MD: Norvel Richards , MD, [id number] CSN: [id number] Age: 67 Admit Type: Outpatient Procedure:                Colonoscopy Indications:              Hematochezia Providers:                Norvel Richards, MD, Crystal Page, Raphael Gibney, Technician Referring MD:             Norvel Richards, MD Medicines:                Propofol per Anesthesia Complications:            No immediate complications. Estimated Blood Loss:     Estimated blood loss: none. Estimated blood loss:                            none. Procedure:                Pre-Anesthesia Assessment:                           - Prior to the procedure, a History and Physical                            was performed, and patient medications and                            allergies were reviewed. The patient's tolerance of                            previous anesthesia was also reviewed. The risks                            and benefits of the procedure and the sedation                            options and risks were discussed with the patient.                            All questions were answered, and informed consent                            was obtained. Prior Anticoagulants: The patient has                            taken no anticoagulant or antiplatelet agents. ASA                            Grade Assessment: III - A patient with severe                            systemic  disease. After reviewing the risks and                            benefits, the patient was deemed in satisfactory                            condition to undergo the procedure.                           After obtaining informed consent, the colonoscope                            was passed under direct vision. Throughout the                            procedure, the patient's blood  pressure, pulse, and                            oxygen saturations were monitored continuously. The                            [id number] scope was introduced through the                            anus and advanced to the the cecum, identified by                            appendiceal orifice and ileocecal valve. The                            colonoscopy was performed without difficulty. The                            patient tolerated the procedure well. The quality                            of the bowel preparation was adequate. The                            ileocecal valve, appendiceal orifice, and rectum                            were photographed. Scope In: 1:49:23 PM Scope Out: 1:59:48 PM Scope Withdrawal Time: 0 hours 6 minutes 25 seconds  Total Procedure Duration: 0 hours 10 minutes 25 seconds  Findings:      The perianal and digital rectal examinations were normal.      Non-bleeding internal hemorrhoids were found during retroflexion. The       hemorrhoids were moderate, medium-sized and Grade I (internal       hemorrhoids that do not prolapse).      Scattered medium-mouthed diverticula were found in the entire colon.       There was no evidence of diverticular bleeding.      The exam was otherwise without abnormality on direct and retroflexion       views.  Impression:               - Non-bleeding internal hemorrhoids.                           - Diverticulosis in the entire examined colon.                            There was no evidence of diverticular bleeding.                           - The examination was otherwise normal on direct                            and retroflexion views.                           - No specimens collected. Moderate Sedation:      Moderate (conscious) sedation was personally administered by an       anesthesia professional. The following parameters were monitored: oxygen       saturation, heart rate, blood pressure, respiratory  rate, EKG, adequacy       of pulmonary ventilation, and response to care. Recommendation:           - Patient has a contact number available for                            emergencies. The signs and symptoms of potential                            delayed complications were discussed with the                            patient. Return to normal activities tomorrow.                            Written discharge instructions were provided to the                            patient.                           - Advance diet as tolerated.                           - Continue present medications.                           - Repeat colonoscopy in 7 years for screening                            purposes.                           - Return to GI office in 2 years. Procedure Code(s):        --- Professional ---  45378, Colonoscopy, flexible; diagnostic, including                            collection of specimen(s) by brushing or washing,                            when performed (separate procedure) Diagnosis Code(s):        --- Professional ---                           K64.0, First degree hemorrhoids                           K92.1, Melena (includes Hematochezia)                           K57.30, Diverticulosis of large intestine without                            perforation or abscess without bleeding CPT copyright 2022 American Medical Association. All rights reserved. The codes documented in this report are preliminary and upon coder review may  be revised to meet current compliance requirements. [name]. [name], MD Norvel Richards, MD 01/24/2023 2:10:48 PM This report has been signed electronically. Number of Addenda: 0

## 2023-02-02 ENCOUNTER — Encounter (HOSPITAL_COMMUNITY): Payer: Self-pay | Admitting: Internal Medicine

## 2023-02-04 DIAGNOSIS — N022 Recurrent and persistent hematuria with diffuse membranous glomerulonephritis: Principal | ICD-10-CM

## 2023-02-04 DIAGNOSIS — N042 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: Principal | ICD-10-CM

## 2023-02-04 DIAGNOSIS — N048 Nephrotic syndrome with other morphologic changes: Principal | ICD-10-CM

## 2023-02-05 DIAGNOSIS — Z940 Kidney transplant status: Principal | ICD-10-CM

## 2023-02-05 DIAGNOSIS — D849 Immunodeficiency, unspecified: Principal | ICD-10-CM

## 2023-02-05 NOTE — Unmapped (Signed)
Pt paged on-call [facility], requesting information about IV iron at [facility]. Explained to patient that PCP would have to order and get scheduled as we don't have ordering privileges. Patient will keep appointment at [facility] on 4/19 @ 1:30PM. No other needs at this time.

## 2023-02-06 DIAGNOSIS — E785 Hyperlipidemia, unspecified: Secondary | ICD-10-CM | POA: Diagnosis not present

## 2023-02-06 DIAGNOSIS — K219 Gastro-esophageal reflux disease without esophagitis: Secondary | ICD-10-CM | POA: Diagnosis not present

## 2023-02-08 DIAGNOSIS — R030 Elevated blood-pressure reading, without diagnosis of hypertension: Secondary | ICD-10-CM | POA: Diagnosis not present

## 2023-02-08 DIAGNOSIS — M129 Arthropathy, unspecified: Secondary | ICD-10-CM | POA: Diagnosis not present

## 2023-02-08 DIAGNOSIS — M25562 Pain in left knee: Secondary | ICD-10-CM | POA: Diagnosis not present

## 2023-02-08 DIAGNOSIS — Z6829 Body mass index (BMI) 29.0-29.9, adult: Secondary | ICD-10-CM | POA: Diagnosis not present

## 2023-02-08 DIAGNOSIS — M545 Low back pain, unspecified: Secondary | ICD-10-CM | POA: Diagnosis not present

## 2023-02-08 DIAGNOSIS — Z79899 Other long term (current) drug therapy: Secondary | ICD-10-CM | POA: Diagnosis not present

## 2023-02-12 ENCOUNTER — Encounter: Payer: Self-pay | Admitting: Gastroenterology

## 2023-02-12 DIAGNOSIS — Z79899 Other long term (current) drug therapy: Secondary | ICD-10-CM | POA: Diagnosis not present

## 2023-02-13 NOTE — Unmapped (Signed)
[facility] Refill Coordination Note    Specialty Medication(s) to be Shipped:   Transplant:  mycophenolic acid 180mg     Other medication(s) to be shipped:  atorvastatin, Vit D, pantoprazole     Michelle Travis, DOB: 1956/04/09  Phone: [phone] (home)       All above HIPAA information was verified with patient.     Was a Nurse, learning disability used for this call? No    Completed refill call assessment today to schedule patient's medication shipment from the [facility] [phone]).  All relevant notes have been reviewed.     Specialty medication(s) and dose(s) confirmed: Regimen is correct and unchanged.   Changes to medications: Rashae reports no changes at this time.  Changes to insurance: No  New side effects reported not previously addressed with a pharmacist or physician: None reported  Questions for the pharmacist: No    Confirmed patient received a Conservation officer, historic buildings and a Surveyor, mining with first shipment. The patient will receive a drug information handout for each medication shipped and additional FDA Medication Guides as required.       DISEASE/MEDICATION-SPECIFIC INFORMATION        N/A    SPECIALTY MEDICATION ADHERENCE     Medication Adherence    Patient reported X missed doses in the last month: 0  Specialty Medication: Mycophenolate 180mg   Patient is on additional specialty medications: No  Adherence tools used: patient uses a pill box to manage medications              Were doses missed due to medication being on hold? No    Mycophenolate 180 mg: 6 days of medicine on hand     REFERRAL TO PHARMACIST     Referral to the pharmacist: Not needed      [facility]     Shipping address confirmed in Epic.       Delivery Scheduled: Yes, Expected medication delivery date: 02/16/23.     Medication will be delivered via UPS to the prescription address in Epic WAM.    [name], [facility]   [facility] Specialty Pharmacist

## 2023-02-15 MED FILL — PANTOPRAZOLE 40 MG TABLET,DELAYED RELEASE: ORAL | 90 days supply | Qty: 90 | Fill #3

## 2023-02-15 MED FILL — ATORVASTATIN 10 MG TABLET: ORAL | 90 days supply | Qty: 90 | Fill #3

## 2023-02-15 MED FILL — MYCOPHENOLATE SODIUM 180 MG TABLET,DELAYED RELEASE: ORAL | 90 days supply | Qty: 360 | Fill #3

## 2023-02-15 MED FILL — CHOLECALCIFEROL (VITAMIN D3) 25 MCG (1,000 UNIT) TABLET: ORAL | 90 days supply | Qty: 90 | Fill #3

## 2023-02-16 ENCOUNTER — Ambulatory Visit: Admit: 2023-02-16 | Discharge: 2023-02-17 | Payer: MEDICARE

## 2023-02-16 DIAGNOSIS — D509 Iron deficiency anemia, unspecified: Secondary | ICD-10-CM | POA: Diagnosis not present

## 2023-02-16 DIAGNOSIS — Z940 Kidney transplant status: Secondary | ICD-10-CM | POA: Diagnosis not present

## 2023-02-16 DIAGNOSIS — N042 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: Principal | ICD-10-CM

## 2023-02-16 DIAGNOSIS — D631 Anemia in chronic kidney disease: Principal | ICD-10-CM

## 2023-02-16 DIAGNOSIS — N1832 Anemia of chronic renal failure, stage 3b (CMS-HCC): Principal | ICD-10-CM

## 2023-02-16 LAB — CBC W/ AUTO DIFF
BASOPHILS ABSOLUTE COUNT: 0.1 10*9/L (ref 0.0–0.1)
BASOPHILS RELATIVE PERCENT: 1.9 %
EOSINOPHILS ABSOLUTE COUNT: 0.2 10*9/L (ref 0.0–0.5)
EOSINOPHILS RELATIVE PERCENT: 3.9 %
HEMATOCRIT: 36.3 % (ref 34.0–44.0)
HEMOGLOBIN: 11.3 g/dL (ref 11.3–14.9)
LYMPHOCYTES ABSOLUTE COUNT: 1.4 10*9/L (ref 1.1–3.6)
LYMPHOCYTES RELATIVE PERCENT: 24.7 %
MEAN CORPUSCULAR HEMOGLOBIN CONC: 31 g/dL — ABNORMAL LOW (ref 32.0–36.0)
MEAN CORPUSCULAR HEMOGLOBIN: 23.4 pg — ABNORMAL LOW (ref 25.9–32.4)
MEAN CORPUSCULAR VOLUME: 75.5 fL — ABNORMAL LOW (ref 77.6–95.7)
MEAN PLATELET VOLUME: 9.1 fL (ref 6.8–10.7)
MONOCYTES ABSOLUTE COUNT: 0.6 10*9/L (ref 0.3–0.8)
MONOCYTES RELATIVE PERCENT: 9.9 %
NEUTROPHILS ABSOLUTE COUNT: 3.4 10*9/L (ref 1.8–7.8)
NEUTROPHILS RELATIVE PERCENT: 59.6 %
PLATELET COUNT: 240 10*9/L (ref 150–450)
RED BLOOD CELL COUNT: 4.81 10*12/L (ref 3.95–5.13)
RED CELL DISTRIBUTION WIDTH: 16.9 % — ABNORMAL HIGH (ref 12.2–15.2)
WBC ADJUSTED: 5.7 10*9/L (ref 3.6–11.2)

## 2023-02-16 LAB — COMPREHENSIVE METABOLIC PANEL
ALBUMIN: 3.4 g/dL (ref 3.4–5.0)
ALKALINE PHOSPHATASE: 77 U/L (ref 46–116)
ALT (SGPT): 14 U/L (ref 10–49)
ANION GAP: 9 mmol/L (ref 5–14)
AST (SGOT): 17 U/L (ref <=34)
BILIRUBIN TOTAL: 0.4 mg/dL (ref 0.3–1.2)
BLOOD UREA NITROGEN: 12 mg/dL (ref 9–23)
BUN / CREAT RATIO: 23
CALCIUM: 8.9 mg/dL (ref 8.7–10.4)
CHLORIDE: 108 mmol/L — ABNORMAL HIGH (ref 98–107)
CO2: 25 mmol/L (ref 20.0–31.0)
CREATININE: 0.53 mg/dL — ABNORMAL LOW
EGFR CKD-EPI (2021) FEMALE: >90 mL/min/{1.73_m2} (ref >=60)
GLUCOSE RANDOM: 101 mg/dL (ref 70–179)
POTASSIUM: 3.9 mmol/L (ref 3.4–4.8)
PROTEIN TOTAL: 6.5 g/dL (ref 5.7–8.2)
SODIUM: 142 mmol/L (ref 135–145)

## 2023-02-16 LAB — MAGNESIUM: MAGNESIUM: 1.5 mg/dL — ABNORMAL LOW (ref 1.6–2.6)

## 2023-02-16 LAB — PHOSPHORUS: PHOSPHORUS: 2.8 mg/dL (ref 2.4–5.1)

## 2023-02-16 LAB — SLIDE REVIEW

## 2023-02-16 MED ADMIN — iron sucrose (VENOFER) 200 mg in sodium chloride (NS) 0.9 % 100 mL IVPB: 200 mg | INTRAVENOUS | @ 18:00:00 | Stop: 2023-02-16

## 2023-02-16 NOTE — Unmapped (Signed)
1335 Patient arrived to the Transplant Infusion room scheduled to receive venofer, Condtion: lethargic; Mobility: ambulating; accompanied by relative.   See Flowsheet and MAR for all details of visit.    1337 VS stable.  1346 PIV placed and secured with coban, labs collected and sent, urine no orders found.  1350 Infusion initiated. Patient educated to notify nursing staff if they experience urticaria, erythema, shortness of breath, nausea, metal taste in their mouth, excessive oral or postnasal drainage and any other changes in status which could be side effects from initiating this medication.  1452 Infusion complete.   Post infusion observation period complete, patient tolerated infusion well and without symptoms of reaction.  1535 VS stable, patient feeling well, PIV removed and secured with coban. Patient left clinic accompanied by relative.

## 2023-02-16 NOTE — Unmapped (Signed)
Spoke with patient about IV iron medication change from Feraheme to Venofer due to insurance authorization. Patient's dose adjusted per Cecilie Lowers, CPP for today's infusion visit due to observation time. Patient verbalized understanding and will be at [facility] before 1PM.

## 2023-02-19 DIAGNOSIS — Z944 Liver transplant status: Principal | ICD-10-CM

## 2023-02-21 DIAGNOSIS — Z796 Long-term use of immunosuppressant medication: Principal | ICD-10-CM

## 2023-02-21 DIAGNOSIS — Z944 Liver transplant status: Principal | ICD-10-CM

## 2023-02-21 DIAGNOSIS — Z940 Kidney transplant status: Principal | ICD-10-CM

## 2023-02-21 MED ORDER — TACROLIMUS XR 0.75 MG TABLET,EXTENDED RELEASE 24 HR
ORAL_TABLET | Freq: Every day | ORAL | 3 refills | 90 days | Status: CP
Start: 2023-02-21 — End: 2024-02-16
  Filled 2023-03-06: qty 90, 90d supply, fill #0

## 2023-02-21 MED ORDER — ENVARSUS XR 1 MG TABLET,EXTENDED RELEASE
ORAL_TABLET | Freq: Every day | ORAL | 3 refills | 90 days | Status: CP
Start: 2023-02-21 — End: 2024-02-21

## 2023-02-21 NOTE — Unmapped (Signed)
Prescription for Envarsus sent to [facility] for MAP renewal.

## 2023-02-22 DIAGNOSIS — Z796 Long-term use of immunosuppressant medication: Principal | ICD-10-CM

## 2023-02-22 DIAGNOSIS — Z944 Liver transplant status: Principal | ICD-10-CM

## 2023-02-22 DIAGNOSIS — Z940 Kidney transplant status: Principal | ICD-10-CM

## 2023-02-27 DIAGNOSIS — Z944 Liver transplant status: Secondary | ICD-10-CM | POA: Diagnosis not present

## 2023-02-27 NOTE — Unmapped (Signed)
[facility] Specialty Medication Onboarding    Specialty Medication: Envarsus XR 0.75mg  tablet  Prior Authorization: Approved   Financial Assistance: No - copay  <$25  Final Copay/Day Supply: $0 / 90 days    Insurance Restrictions: None     Notes to Pharmacist: N/A      [facility] Specialty Medication Onboarding    Specialty Medication: Envarsus XR 1mg  tablet  Prior Authorization: Approved   Financial Assistance: No - copay  <$25  Final Copay/Day Supply: $0 / 90 days    Insurance Restrictions: None     Notes to Pharmacist: N/A    The triage team has completed the benefits investigation and has determined that the patient is able to fill this medication at [facility]. Please contact the patient to complete the onboarding or follow up with the prescribing physician as needed.

## 2023-02-27 NOTE — Unmapped (Unsigned)
***incomplete onboarding note***    The following medication is onboarded in this note:  Envarsus  $0 / 90 days   [facility]   Patient Onboarding/Medication Counseling    Michelle Travis is a 67 y.o. female with liver kidney transplant who I am counseling today on continuation of therapy.  I am speaking to {Blank:19197::the patient,the patient's caregiver, ***,the patient's family member, ***,***}.    Was a Nurse, learning disability used for this call? No    Verified patient's date of birth / HIPAA.    Specialty medication(s) to be sent: {specpharm:59087}      Non-specialty medications/supplies to be sent: ***      Medications not needed at this time: ***         The patient declined counseling on {sscdeclinecounseling:66033} because {blank single:19197::they were counseled in clinic,they have taken the medication previously}. The information in the declined sections below are for informational purposes only and was not discussed with patient.   Envarsus (tacrolimus XR)    Medication & Administration     Dosage: take 2.5mg  total (1x1mg  tablet and 2x0.75mg  tablets per dose) by mouth once daily in the morning     Administration:   Take in the morning on empty stomach - 1 hour before or 2 hours after breakfast  Take with drink of water  Do not crush, break, or chew tablets    Adherence/Missed dose instructions:  Take a missed dose as soon as you think about it.  If it has been more than 15 hours since missed dose, skip the missed dose and go back to your regular time  Report all missed doses to transplant coordinator    Goals of Therapy     To prevent organ rejection    Side Effects & Monitoring Parameters     Common side effects  Fatigue  Headache  Stuffy nose or sore throat  Nausea, vomiting, stomach pain, diarrhea, constipation  Heartburn  Back or joint pain  Increased risk of infection    The following side effects should be reported to the provider:  Allergic reaction  Kidney issues (change in quantity or urine passed, blood in urine, or weight gain)  High blood pressure (dizziness, change in eyesight, headache)  Electrolyte issues (change in mood, confusion, muscle pain, or weakness)  Abnormal breathing  Shakiness  Unexplained bleeding or bruising (gums bleeding, blood in urine, nosebleeds, any abnormal bleeding)  Signs of infection    Monitoring Parameters  Renal function  Liver function  Glucose levels  Blood pressure  Tacrolimus trough levels      Contraindications, Warnings, & Precautions     Black Box Warning: Infections - immunosuppressant agents increase the risk of infection that may lead to hospitalization or death  Black Box Warning: Malignancy - immunosuppressant agents may be associated with the development of malignancies that may lead to hospitalization or death.  Limit or avoid sun and ultraviolet light exposure, use appropriate sun protection  Myocardial hypertrophy -avoid use in patients with congenital long QT syndrome  Diabetes mellitus - the risk for new-onset diabetes and insulin-dependent post-transplant diabetes mellitus is increased with tacrolimus use after transplantation  GI perforation  Hyperkalemia  Hypertension  Nephrotoxicity  Neurotoxicity    Drug/Food Interactions     Medication list reviewed in Epic.  No interactions noted that clinic is not already monitoring .   Due to amount and severity of drug interactions, report ALL medications starts, discontinuations, and changes to transplant coordinator prior to making the change  Avoid alcohol  Avoid grapefruit or grapefruit juice  Avoid live vaccines    Storage, Handling Precautions, & Disposal     Store at room temperature  Keep away from children and pets      Current Medications (including OTC/herbals), Comorbidities and Allergies     Current Outpatient Medications   Medication Sig Dispense Refill    acetaminophen (TYLENOL 8 HOUR) 650 MG CR tablet Take 1 tablet (650 mg total) by mouth every four (4) hours as needed for pain. 30 tablet 0    atorvastatin (LIPITOR) 10 MG tablet Take 1 tablet (10 mg total) by mouth daily. 90 tablet 3    buprenorphine (BELBUCA) 150 mcg buccal film Apply 1 Film to cheek at bedtime.      cholecalciferol, vitamin D3 25 mcg, 1,000 units,, 1,000 unit (25 mcg) tablet Take 1 tablet (25 mcg total) by mouth in the morning. 90 tablet 3    ENVARSUS XR 1 mg Tb24 extended release tablet Take 1 tablet (1 mg total) by mouth in the morning. Take with TWO 0.75 mg tablets (1.5 mg total) by mouth in the morning for a total of 2.5 mg. 90 tablet 3    mycophenolate (MYFORTIC) 180 MG EC tablet Take 2 tablets (360 mg total) by mouth two (2) times a day. 360 tablet 3    oxyCODONE (ROXICODONE) 10 mg immediate release tablet TAKE 1 (ONE) TABLET BY MOUTH FOUR TIMES DAILY, AS NEEDED      pantoprazole (PROTONIX) 40 MG tablet Take 1 tablet (40 mg total) by mouth daily. 90 tablet 3    tacrolimus (ENVARSUS XR) 0.75 mg Tb24 extended release tablet Take 2 tablets (1.5 mg total) by mouth in the morning. Take with ONE 1 mg tablet by mouth in the morning for a total of 2.5 mg. 180 tablet 3     No current facility-administered medications for this visit.       Allergies   Allergen Reactions    Latex Swelling and Rash     Discoloration of skin.   Discoloration of skin.     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    Appropriateness of Therapy     Acute infections noted within Epic:  No active infections  Patient reported infection: {Blank single:19197::None,***- patient reported to provider,***- pharmacy reported to provider}    Is medication and dose appropriate based on diagnosis and infection status? Yes    Prescription has been clinically reviewed: Yes  Baseline Quality of Life Assessment      How many days over the past month did your transplant  keep you from your normal activities? For example, brushing your teeth or getting up in the morning. {Blank:19197::***,0,Patient declined to answer}    Financial Information     Medication Assistance provided: None Required    Anticipated copay of $0/90ds not part b on both strengths reviewed with patient. Verified delivery address.    Delivery Information     Scheduled delivery date: ***    Expected start date: patient is already currently taking (has been getting from mfg)      Medication will be delivered via {Blank:19197::UPS,Next Day Courier,Same Day Courier,Clinic Courier - *** clinic,***} to the {Blank:19197::prescription,temporary} address in Epic WAM.  This shipment will not require a signature.      Explained the services we provide at [facility] and that each month we would call to set up refills.  Stressed importance of returning phone calls so that we could ensure they receive their medications in time each month.  Informed patient that we should be setting up refills 7-10 days prior to when they will run out of medication.  A pharmacist will reach out to perform a clinical assessment periodically.  Informed patient that a welcome packet, containing information about our review of or adjustments to the care plan at any time.      Patient or caregiver verbalized understanding of the above information as well as how to contact the pharmacy at [phone] option 4 with any questions/concerns.  The pharmacy is open Monday through Friday 8:30am-4:30pm.  A pharmacist is available 24/7 via pager to answer any clinical questions they may have.    Patient Specific Needs     Does the patient have any physical, cognitive, or cultural barriers? No    Does the patient have adequate living arrangements? (i.e. the ability to store and take their medication appropriately) Yes    Did you identify any home environmental safety or security hazards? No    Patient prefers to have medications discussed with  Patient     Is the patient or caregiver able to read and understand education materials at a high school level or above? Yes    Patient's primary language is  English     Is the patient high risk? Yes, patient is taking a REMS drug. Medication is dispensed in compliance with REMS program    SOCIAL DETERMINANTS OF HEALTH     At the [facility], we have learned that life circumstances - like trouble affording food, housing, utilities, or transportation can affect the health of many of our patients.   That is why we wanted to ask: are you currently experiencing any life circumstances that are negatively impacting your health and/or quality of life? No    Social Determinants of Health     Financial Resource Strain: Low Risk  (11/20/2022)    Received from [facility]    Overall Financial Resource Strain (CARDIA)     Difficulty of Paying Living Expenses: Not hard at all   Internet Connectivity: No Internet connectivity concern identified (10/25/2022)    Internet Connectivity     Do you have access to internet services: Yes     How do you connect to the internet: Personal Device at home     Is your internet connection strong enough for you to watch video on your device without major problems?: Yes Do you have enough  data to get through the month?: Yes     Does at least one of the devices have a camera that you can use for video chat?: Yes   Food Insecurity: No Food Insecurity (11/20/2022)    Received from [facility]    Hunger Vital Sign     Worried About Running Out of Food in the Last Year: Never true     Ran Out of Food in the Last Year: Never true   Tobacco Use: Low Risk  (02/02/2023)    Received from [facility]    Patient History     Smoking Tobacco Use: Never     Smokeless Tobacco Use: Never     Passive Exposure: Not on file   Housing/Utilities: Low Risk  (10/25/2022)    Housing/Utilities     Within the past 12 months, have you ever stayed: outside, in a car, in a tent, in an overnight shelter, or temporarily in someone else's home (i.e. couch-surfing)?: No     Are you worried about losing your housing?: No     Within the past 12 months, have you been unable to get utilities (heat, electricity) when it was really needed?: No   Alcohol Use: Not At Risk (10/25/2022)    Alcohol Use     How often do you have a drink containing alcohol?: Never     How many drinks containing alcohol do you have on a typical day when you are drinking?: Not on file     How often do you have 5 or more drinks on one occasion?: Never   Transportation Needs: No Transportation Needs (11/20/2022)    Received from [facility]     Lack of Transportation (Medical): No     Lack of Transportation (Non-Medical): No   Substance Use: Low Risk  (10/25/2022)    Substance Use     Taken prescription drugs for non-medical reasons: Never     Taken illegal drugs: Never     Patient indicated they have taken drugs in the past year for non-medical reasons: Yes, [positive answer(s)]: Not on file   Health Literacy: Not on file   Physical Activity: Sufficiently Active (11/20/2022)    Received from [facility]    Exercise Vital Sign     Days of Exercise per Week: 7 days     Minutes of Exercise per Session: 70 min   Interpersonal prescription drugs for non-medical reasons: Never     Taken illegal drugs: Never     Patient indicated they have taken drugs in the past year for non-medical reasons: Yes, [positive answer(s)]: Not on file   Health Literacy: Not on file   Physical Activity: Sufficiently Active (11/20/2022)    Received from [facility]    Exercise Vital Sign     Days of Exercise per Week: 7 days     Minutes of Exercise per Session: 70 min   Interpersonal Safety: Not on file   Stress: No Stress Concern Present (11/20/2022)    Received from [facility] - Occupational Stress Questionnaire     Feeling of Stress : Only a little   Intimate Partner Violence: Not At Risk (11/20/2022)    Received from [facility]    Humiliation, Afraid, Rape, and Kick questionnaire     Fear of Current or Ex-Partner: No     Emotionally Abused: No     Physically Abused: No  Sexually Abused: No   Depression: Not at risk (02/24/2021)    PHQ-2     PHQ-2 Score: 0   Social Connections: Moderately Isolated (11/20/2022)    Received from [facility]    Social Connection and Isolation Panel [NHANES]     Frequency of Communication with Friends and Family: More than three times a week     Frequency of Social Gatherings with Friends and Family: Once a week     Attends Religious Services: More than 4 times per year     Active Member of [employer] or Organizations: No     Attends Banker Meetings: Never     Marital Status: Widowed       Would you be willing to receive help with any of the needs that you have identified today? {Yes/No/Not applicable:93005}       [name], PharmD  [facility] Specialty Pharmacist

## 2023-02-28 ENCOUNTER — Ambulatory Visit: Admit: 2023-02-28 | Discharge: 2023-03-01 | Payer: MEDICARE

## 2023-02-28 DIAGNOSIS — Z940 Kidney transplant status: Secondary | ICD-10-CM | POA: Diagnosis not present

## 2023-02-28 DIAGNOSIS — N048 Nephrotic syndrome with other morphologic changes: Secondary | ICD-10-CM | POA: Diagnosis not present

## 2023-02-28 DIAGNOSIS — R8279 Other abnormal findings on microbiological examination of urine: Secondary | ICD-10-CM | POA: Diagnosis not present

## 2023-02-28 DIAGNOSIS — D509 Iron deficiency anemia, unspecified: Secondary | ICD-10-CM | POA: Diagnosis not present

## 2023-02-28 DIAGNOSIS — N042 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, unspecified: Secondary | ICD-10-CM | POA: Diagnosis not present

## 2023-02-28 DIAGNOSIS — Z4822 Encounter for aftercare following kidney transplant: Secondary | ICD-10-CM | POA: Diagnosis not present

## 2023-02-28 DIAGNOSIS — N022 Recurrent and persistent hematuria with diffuse membranous glomerulonephritis: Secondary | ICD-10-CM | POA: Diagnosis not present

## 2023-02-28 DIAGNOSIS — D849 Immunodeficiency, unspecified: Secondary | ICD-10-CM | POA: Diagnosis not present

## 2023-02-28 DIAGNOSIS — Z944 Liver transplant status: Principal | ICD-10-CM

## 2023-02-28 LAB — CBC W/ AUTO DIFF
BASOPHILS ABSOLUTE COUNT: 0.1 10*9/L (ref 0.0–0.1)
BASOPHILS RELATIVE PERCENT: 2.5 %
EOSINOPHILS ABSOLUTE COUNT: 0.2 10*9/L (ref 0.0–0.5)
EOSINOPHILS RELATIVE PERCENT: 5.2 %
HEMATOCRIT: 38.7 % (ref 34.0–44.0)
HEMOGLOBIN: 12.2 g/dL (ref 11.3–14.9)
LYMPHOCYTES ABSOLUTE COUNT: 1.2 10*9/L (ref 1.1–3.6)
LYMPHOCYTES RELATIVE PERCENT: 37.7 %
MEAN CORPUSCULAR HEMOGLOBIN CONC: 31.6 g/dL — ABNORMAL LOW (ref 32.0–36.0)
MEAN CORPUSCULAR HEMOGLOBIN: 24.2 pg — ABNORMAL LOW (ref 25.9–32.4)
MEAN CORPUSCULAR VOLUME: 76.3 fL — ABNORMAL LOW (ref 77.6–95.7)
MEAN PLATELET VOLUME: 9.4 fL (ref 6.8–10.7)
MONOCYTES ABSOLUTE COUNT: 0.3 10*9/L (ref 0.3–0.8)
MONOCYTES RELATIVE PERCENT: 10 %
NEUTROPHILS ABSOLUTE COUNT: 1.4 10*9/L — ABNORMAL LOW (ref 1.8–7.8)
NEUTROPHILS RELATIVE PERCENT: 44.6 %
PLATELET COUNT: 265 10*9/L (ref 150–450)
RED BLOOD CELL COUNT: 5.07 10*12/L (ref 3.95–5.13)
RED CELL DISTRIBUTION WIDTH: 18.1 % — ABNORMAL HIGH (ref 12.2–15.2)
WBC ADJUSTED: 3.2 10*9/L — ABNORMAL LOW (ref 3.6–11.2)

## 2023-02-28 LAB — COMPREHENSIVE METABOLIC PANEL
A/G RATIO: 1.8 (ref 1.2–2.2)
ALBUMIN: 4.1 g/dL (ref 3.9–4.9)
ALKALINE PHOSPHATASE: 78 IU/L (ref 44–121)
ALT (SGPT): 14 IU/L (ref 0–32)
AST (SGOT): 16 IU/L (ref 0–40)
BILIRUBIN TOTAL (MG/DL) IN SER/PLAS: 0.4 mg/dL (ref 0.0–1.2)
BLOOD UREA NITROGEN: 9 mg/dL (ref 8–27)
BUN / CREAT RATIO: 14 (ref 12–28)
CALCIUM: 9.3 mg/dL (ref 8.7–10.3)
CHLORIDE: 105 mmol/L (ref 96–106)
CO2: 23 mmol/L (ref 20–29)
CREATININE: 0.66 mg/dL (ref 0.57–1.00)
EGFR: 96 mL/min/{1.73_m2}
GLOBULIN, TOTAL: 2.3 g/dL (ref 1.5–4.5)
GLUCOSE: 101 mg/dL — ABNORMAL HIGH (ref 70–99)
POTASSIUM: 5 mmol/L (ref 3.5–5.2)
SODIUM: 143 mmol/L (ref 134–144)
TOTAL PROTEIN: 6.4 g/dL (ref 6.0–8.5)

## 2023-02-28 LAB — PROTEIN / CREATININE RATIO, URINE
CREATININE, URINE: 70.7 mg/dL
PROTEIN URINE: 29 mg/dL
PROTEIN/CREAT RATIO, URINE: 0.41

## 2023-02-28 LAB — SLIDE REVIEW

## 2023-02-28 MED ADMIN — iron sucrose (VENOFER) 400 mg in sodium chloride (NS) 0.9 % 250 mL IVPB: 400 mg | INTRAVENOUS | @ 14:00:00 | Stop: 2023-02-28

## 2023-02-28 NOTE — Unmapped (Signed)
1610 Patient arrived to the Transplant Infusion room scheduled to receive Venofer 400mg , Condtion: well; Mobility: ambulating and via wheelchair; accompanied by relative.   See Flowsheet and MAR for all details of visit.    0948 VS stable.  0945 PIV placed and secured with coban, labs collected and sent, urine collected and sent.  No Premedications ordered.  9604 Infusion initiated. Patient educated to notify nursing staff if they experience urticaria, erythema, shortness of breath, nausea, metal taste in their mouth, excessive oral or postnasal drainage and any other changes in status which could be side effects from initiating this medication.  1229 Infusion complete.   1300 Post infusion observation period complete, patient tolerated infusion well and without symptoms of reaction.  1300 VS stable, patient feeling well, PIV removed and secured with coban.   1311 Patient left clinic ambulating and via wheelchair, accompanied by relative.

## 2023-02-28 NOTE — Unmapped (Signed)
5/1: patient was getting envarsus from [employer], however now covered at [facility] for $0/90ds. Patient aware and will fill at [facility] going forward, clinic aware as well [name]      [facility] Clinical Assessment & Refill Coordination Note    Michelle Travis, DOB: 1956-06-23  Phone: [phone] (home)     All above HIPAA information was verified with patient.     Was a Nurse, learning disability used for this call? No    Specialty Medication(s):   Transplant: Envarsus 1mg , Envarsus 0.75mg , and  mycophenolic acid 180mg      Current Outpatient Medications   Medication Sig Dispense Refill    acetaminophen (TYLENOL 8 HOUR) 650 MG CR tablet Take 1 tablet (650 mg total) by mouth every four (4) hours as needed for pain. 30 tablet 0    atorvastatin (LIPITOR) 10 MG tablet Take 1 tablet (10 mg total) by mouth daily. 90 tablet 3    buprenorphine (BELBUCA) 150 mcg buccal film Apply 1 Film to cheek at bedtime.      cholecalciferol, vitamin D3 25 mcg, 1,000 units,, 1,000 unit (25 mcg) tablet Take 1 tablet (25 mcg total) by mouth in the morning. 90 tablet 3    ENVARSUS XR 1 mg Tb24 extended release tablet Take 1 tablet (1 mg total) by mouth in the morning. Take with TWO 0.75 mg tablets (1.5 mg total) by mouth in the morning for a total of 2.5 mg. 90 tablet 3    mycophenolate (MYFORTIC) 180 MG EC tablet Take 2 tablets (360 mg total) by mouth two (2) times a day. 360 tablet 3    oxyCODONE (ROXICODONE) 10 mg immediate release tablet TAKE 1 (ONE) TABLET BY MOUTH FOUR TIMES DAILY, AS NEEDED      pantoprazole (PROTONIX) 40 MG tablet Take 1 tablet (40 mg total) by mouth daily. 90 tablet 3    tacrolimus (ENVARSUS XR) 0.75 mg Tb24 extended release tablet Take 2 tablets (1.5 mg total) by mouth in the morning. Take with ONE 1 mg tablet by mouth in the morning for a total of 2.5 mg. 180 tablet 3     No current facility-administered medications for this visit.     Facility-Administered Medications Ordered in Other Visits   Medication Dose Route Frequency Provider Last Rate Last Admin    iron sucrose (VENOFER) 400 mg in sodium chloride (NS) 0.9 % 250 mL IVPB  400 mg Intravenous Once Chargualaf, Lorel Monaco, CPP            Changes to medications: Michelle Travis reports no changes at this time.    Allergies   Allergen Reactions    Latex Swelling and Rash     Discoloration of skin.   Discoloration of skin.     Hydrocodone Itching    Hydrocodone-Acetaminophen Itching       Changes to allergies: No    SPECIALTY MEDICATION ADHERENCE     Envarsus 1mg   : 10 days of medicine on hand   Envarsus 0.75mg   : 10 days of medicine on hand   Mycophenolate 180mg   : 75 days of medicine on hand     Medication Adherence    Patient reported X missed doses in the last month: 0  Specialty Medication: mycophenolate 180mg   Patient is on additional specialty medications: Yes  Additional Specialty Medications: Envarsus 1mg   Patient Reported Additional Medication X Missed Doses in the Last Month: 0  Patient is on more than two specialty medications: Yes  Specialty Medication: envarsus  0.75mg   Patient Reported Additional Medication X Missed Doses in the Last Month: 0  Adherence tools used: patient uses a pill box to manage medications          Specialty medication(s) dose(s) confirmed: Regimen is correct and unchanged.     Are there any concerns with adherence? No    Adherence counseling provided? Not needed    CLINICAL MANAGEMENT AND INTERVENTION      Clinical Benefit Assessment:    Do you feel the medicine is effective or helping your condition? Yes    Clinical Benefit counseling provided? Not needed    Adverse Effects Assessment:    Are you experiencing any side effects? No    Are you experiencing difficulty administering your medicine? No    Quality of Life Assessment:    Quality of Life    Rheumatology  Oncology  Dermatology  Cystic Fibrosis          How many days over the past month did your transplant  keep you from your normal activities? For example, brushing your teeth or getting up in the morning. 0    Have you discussed this with your provider? Not needed    Acute Infection Status:    Acute infections noted within Epic:  No active infections  Patient reported infection: None    Therapy Appropriateness:    Is therapy appropriate and patient progressing towards therapeutic goals? Yes, therapy is appropriate and should be continued    DISEASE/MEDICATION-SPECIFIC INFORMATION      N/A    Solid Organ Transplant: Not Applicable    PATIENT SPECIFIC NEEDS     Does the patient have any physical, cognitive, or cultural barriers? No    Is the patient high risk? Yes, patient is taking a REMS drug. Medication is dispensed in compliance with REMS program    Did the patient require a clinical intervention? No    Does the patient require physician intervention or other additional services (i.e., nutrition, smoking cessation, social work)? No    SOCIAL DETERMINANTS OF HEALTH     At the [facility], we have learned that life circumstances - like trouble affording food, housing, utilities, or transportation can affect the health of many of our patients.   That is why we wanted to ask: are you currently experiencing any life circumstances that are negatively impacting your health and/or quality of life? No    Social Determinants of Health     Financial Resource Strain: Low Risk  (11/20/2022)    Received from [facility]    Overall Financial Resource Strain (CARDIA)     Difficulty of Paying Living Expenses: Not hard at all   Internet Connectivity: No Internet connectivity concern identified (10/25/2022)    Internet Connectivity     Do you have access to internet services: Yes     How do you connect to the internet: Personal Device at home     Is your internet connection strong enough for you to watch video on your device without major problems?: Yes     Do you have enough data to get through the month?: Yes     Does at least one of the devices have a camera that you can use for video chat?: Yes   Food Insecurity: No Food Insecurity (11/20/2022)    Received from [facility]    Hunger Vital Sign     Worried About Running Out of Food in the Last Year: Never true  Ran Out of Food in the Last Year: Never true   Tobacco Use: Low Risk  (02/02/2023)    Received from [facility]    Patient History     Smoking Tobacco Use: Never     Smokeless Tobacco Use: Never     Passive Exposure: Not on file   Housing/Utilities: Low Risk  (10/25/2022)    Housing/Utilities     Within the past 12 months, have you ever stayed: outside, in a car, in a tent, in an overnight shelter, or temporarily in someone else's home (i.e. couch-surfing)?: No     Are you worried about losing your housing?: No     Within the past 12 months, have you been unable to get utilities (heat, electricity) when it was really needed?: No   Alcohol Use: Not At Risk (10/25/2022)    Alcohol Use     How often do you have a drink containing alcohol?: Never     How many drinks containing alcohol do you have on a typical day when you are drinking?: Not on file     How often do you have 5 or more drinks on one occasion?: Never   Transportation Needs: No Transportation Needs (11/20/2022)    Received from [facility] - Transportation     Lack of Transportation (Medical): No     Lack of Transportation (Non-Medical): No   Substance Use: Low Risk  (10/25/2022)    Substance Use     Taken prescription drugs for non-medical reasons: Never     Taken illegal drugs: Never     Patient indicated they have taken drugs in the past year for non-medical reasons: Yes, [positive answer(s)]: Not on file   Health Literacy: Not on file   Physical Activity: Sufficiently Active (11/20/2022)    Received from [facility]    Exercise Vital Sign     Days of Exercise per Week: 7 days     Minutes of Exercise per Session: 70 min   Interpersonal Safety: Not on file   Stress: No Stress Concern Present (11/20/2022)    Received from [facility] - Occupational Stress Questionnaire     Feeling of Stress : Only a little   Intimate Partner Violence: Not At Risk (11/20/2022)    Received from [facility]    Humiliation, Afraid, Rape, and Kick questionnaire     Fear of Current or Ex-Partner: No     Emotionally Abused: No     Physically Abused: No     Sexually Abused: No   Depression: Not at risk (02/24/2021)    PHQ-2     PHQ-2 Score: 0   Social Connections: Moderately Isolated (11/20/2022)    Received from [facility]    Social Connection and Isolation Panel [NHANES]     Frequency of Communication with Friends and Family: More than three times a week     Frequency of Social Gatherings with Friends and Family: Once a week     Attends Religious Services: More than 4 times per year     Active Member of [employer] or Organizations: No     Attends Banker Meetings: Never     Marital Status: Widowed       Would you be willing to receive help with any of the needs that you have identified today? Not applicable       SHIPPING     Specialty Medication(s) to be  Shipped:   Transplant: Envarsus 1mg  and Envarsus 0.75mg     Other medication(s) to be shipped: No additional medications requested for fill at this time     Changes to insurance: No    Delivery Scheduled: Yes, Expected medication delivery date: 03/07/2023.     Medication will be delivered via UPS to the confirmed prescription address in [facility].    The patient will receive a drug information handout for each medication shipped and additional FDA Medication Guides as required.  Verified that patient has previously received a Conservation officer, historic buildings and a Surveyor, mining.    The patient or caregiver noted above participated in the development of this care plan and knows that they can request review of or adjustments to the care plan at any time.      All of the patient's questions and concerns have been addressed.    [name], PharmD   [facility] Specialty Pharmacist

## 2023-03-02 DIAGNOSIS — Z796 Long-term use of immunosuppressant medication: Principal | ICD-10-CM

## 2023-03-02 DIAGNOSIS — Z944 Liver transplant status: Principal | ICD-10-CM

## 2023-03-02 NOTE — Unmapped (Signed)
Reviewed labs from 02/28/23 with Dr. Sherryll Burger. He stated labs were fine and pt should continue every three month surveillance.    Called the pt to review her labs but she was not available. Left a voicemail message that her lab results were fine and we'll repeat having blood work drawn when she comes for her appointment with Dr. Sherryll Burger on 04/11/23. Requested the pt call with any questions or concerns.    Lab orders entered for upcoming appointment.

## 2023-03-06 MED FILL — ENVARSUS XR 0.75 MG TABLET,EXTENDED RELEASE: ORAL | 90 days supply | Qty: 180 | Fill #0

## 2023-03-08 DIAGNOSIS — R030 Elevated blood-pressure reading, without diagnosis of hypertension: Secondary | ICD-10-CM | POA: Diagnosis not present

## 2023-03-08 DIAGNOSIS — M545 Low back pain, unspecified: Secondary | ICD-10-CM | POA: Diagnosis not present

## 2023-03-08 DIAGNOSIS — Z79899 Other long term (current) drug therapy: Secondary | ICD-10-CM | POA: Diagnosis not present

## 2023-03-08 DIAGNOSIS — E785 Hyperlipidemia, unspecified: Secondary | ICD-10-CM | POA: Diagnosis not present

## 2023-03-08 DIAGNOSIS — Z6829 Body mass index (BMI) 29.0-29.9, adult: Secondary | ICD-10-CM | POA: Diagnosis not present

## 2023-03-08 DIAGNOSIS — K219 Gastro-esophageal reflux disease without esophagitis: Secondary | ICD-10-CM | POA: Diagnosis not present

## 2023-03-08 DIAGNOSIS — M129 Arthropathy, unspecified: Secondary | ICD-10-CM | POA: Diagnosis not present

## 2023-03-08 DIAGNOSIS — M25562 Pain in left knee: Secondary | ICD-10-CM | POA: Diagnosis not present

## 2023-03-12 ENCOUNTER — Ambulatory Visit: Admit: 2023-03-12 | Discharge: 2023-03-13 | Payer: MEDICARE

## 2023-03-12 DIAGNOSIS — Z79899 Other long term (current) drug therapy: Secondary | ICD-10-CM | POA: Diagnosis not present

## 2023-03-12 DIAGNOSIS — Z7960 Long term (current) use of unspecified immunomodulators and immunosuppressants: Secondary | ICD-10-CM | POA: Diagnosis not present

## 2023-03-12 DIAGNOSIS — D509 Iron deficiency anemia, unspecified: Secondary | ICD-10-CM | POA: Diagnosis not present

## 2023-03-12 DIAGNOSIS — Z944 Liver transplant status: Secondary | ICD-10-CM | POA: Diagnosis not present

## 2023-03-12 LAB — COMPREHENSIVE METABOLIC PANEL
ALBUMIN: 3.7 g/dL (ref 3.4–5.0)
ALKALINE PHOSPHATASE: 73 U/L (ref 46–116)
ALT (SGPT): 17 U/L (ref 10–49)
ANION GAP: 8 mmol/L (ref 5–14)
AST (SGOT): 19 U/L (ref <=34)
BILIRUBIN TOTAL: 0.5 mg/dL (ref 0.3–1.2)
BLOOD UREA NITROGEN: 18 mg/dL (ref 9–23)
BUN / CREAT RATIO: 33
CALCIUM: 9.4 mg/dL (ref 8.7–10.4)
CHLORIDE: 108 mmol/L — ABNORMAL HIGH (ref 98–107)
CO2: 26 mmol/L (ref 20.0–31.0)
CREATININE: 0.55 mg/dL
EGFR CKD-EPI (2021) FEMALE: >90 mL/min/{1.73_m2} (ref >=60)
GLUCOSE RANDOM: 101 mg/dL — ABNORMAL HIGH (ref 70–99)
POTASSIUM: 4 mmol/L (ref 3.4–4.8)
PROTEIN TOTAL: 6.6 g/dL (ref 5.7–8.2)
SODIUM: 142 mmol/L (ref 135–145)

## 2023-03-12 LAB — CBC W/ AUTO DIFF
BASOPHILS ABSOLUTE COUNT: 0 10*9/L (ref 0.0–0.1)
BASOPHILS RELATIVE PERCENT: 1.1 %
EOSINOPHILS ABSOLUTE COUNT: 0.1 10*9/L (ref 0.0–0.5)
EOSINOPHILS RELATIVE PERCENT: 3.6 %
HEMATOCRIT: 39.8 % (ref 34.0–44.0)
HEMOGLOBIN: 12.6 g/dL (ref 11.3–14.9)
LYMPHOCYTES ABSOLUTE COUNT: 1.1 10*9/L (ref 1.1–3.6)
LYMPHOCYTES RELATIVE PERCENT: 30.2 %
MEAN CORPUSCULAR HEMOGLOBIN CONC: 31.6 g/dL — ABNORMAL LOW (ref 32.0–36.0)
MEAN CORPUSCULAR HEMOGLOBIN: 24.6 pg — ABNORMAL LOW (ref 25.9–32.4)
MEAN CORPUSCULAR VOLUME: 77.7 fL (ref 77.6–95.7)
MEAN PLATELET VOLUME: 10 fL (ref 6.8–10.7)
MONOCYTES ABSOLUTE COUNT: 0.6 10*9/L (ref 0.3–0.8)
MONOCYTES RELATIVE PERCENT: 16.5 %
NEUTROPHILS ABSOLUTE COUNT: 1.8 10*9/L (ref 1.8–7.8)
NEUTROPHILS RELATIVE PERCENT: 48.6 %
PLATELET COUNT: 158 10*9/L (ref 150–450)
RED BLOOD CELL COUNT: 5.12 10*12/L (ref 3.95–5.13)
RED CELL DISTRIBUTION WIDTH: 19 % — ABNORMAL HIGH (ref 12.2–15.2)
WBC ADJUSTED: 3.6 10*9/L (ref 3.6–11.2)

## 2023-03-12 LAB — SLIDE REVIEW

## 2023-03-12 LAB — MAGNESIUM: MAGNESIUM: 1.5 mg/dL — ABNORMAL LOW (ref 1.6–2.6)

## 2023-03-12 LAB — GAMMA GT: GAMMA GLUTAMYL TRANSFERASE: 47 U/L — ABNORMAL HIGH

## 2023-03-12 LAB — BILIRUBIN, DIRECT: BILIRUBIN DIRECT: 0.2 mg/dL (ref 0.00–0.30)

## 2023-03-12 LAB — PHOSPHORUS: PHOSPHORUS: 4.1 mg/dL (ref 2.4–5.1)

## 2023-03-12 MED ADMIN — iron sucrose (VENOFER) 400 mg in sodium chloride (NS) 0.9 % 250 mL IVPB: 400 mg | INTRAVENOUS | @ 14:00:00 | Stop: 2023-03-12

## 2023-03-12 MED ADMIN — acetaminophen (TYLENOL) tablet 975 mg: 975 mg | ORAL | @ 16:00:00 | Stop: 2023-03-12

## 2023-03-12 NOTE — Unmapped (Signed)
0900 Patient arrived to the Transplant Infusion room scheduled to receive Venofor, Condtion: well; Mobility: via wheelchair; accompanied by relative.   See Flowsheet and MAR for all details of visit.    0914 VS stable.  1610 PIV placed and secured with coban, labs collected and sent, urine no orders.  0932 Infusion initiated. Patient educated to notify nursing staff if they experience urticaria, erythema, shortness of breath, nausea, metal taste in their mouth, excessive oral or postnasal drainage and any other changes in status which could be side effects from initiating this medication.  1202 Infusion complete.   1205 Tylenol 975 mg administered for headache pain level 6 of 10.  Post infusion observation period complete, patient tolerated infusion well and without symptoms of reaction.  1245 VS stable, patient feeling well, PIV removed and secured with coban. Patient left clinic accompanied by self.

## 2023-03-30 ENCOUNTER — Other Ambulatory Visit (HOSPITAL_BASED_OUTPATIENT_CLINIC_OR_DEPARTMENT_OTHER): Payer: Self-pay

## 2023-03-30 ENCOUNTER — Emergency Department (HOSPITAL_BASED_OUTPATIENT_CLINIC_OR_DEPARTMENT_OTHER)
Admission: EM | Admit: 2023-03-30 | Discharge: 2023-03-30 | Disposition: A | Discharge status: Home / Self Care | Payer: Medicare HMO | Attending: Emergency Medicine | Admitting: Emergency Medicine

## 2023-03-30 ENCOUNTER — Other Ambulatory Visit: Payer: Self-pay

## 2023-03-30 ENCOUNTER — Encounter (HOSPITAL_BASED_OUTPATIENT_CLINIC_OR_DEPARTMENT_OTHER): Payer: Self-pay | Admitting: Emergency Medicine

## 2023-03-30 DIAGNOSIS — J029 Acute pharyngitis, unspecified: Secondary | ICD-10-CM | POA: Diagnosis present

## 2023-03-30 DIAGNOSIS — Z7982 Long term (current) use of aspirin: Secondary | ICD-10-CM | POA: Insufficient documentation

## 2023-03-30 DIAGNOSIS — K0510 Chronic gingivitis, plaque induced: Secondary | ICD-10-CM | POA: Diagnosis not present

## 2023-03-30 DIAGNOSIS — Z91040 Latex allergy status: Secondary | ICD-10-CM | POA: Diagnosis not present

## 2023-03-30 DIAGNOSIS — B002 Herpesviral gingivostomatitis and pharyngotonsillitis: Secondary | ICD-10-CM | POA: Insufficient documentation

## 2023-03-30 LAB — CBC WITH DIFFERENTIAL/PLATELET
Abs Immature Granulocytes: 0.01 10*3/uL (ref 0.00–0.07)
Basophils Absolute: 0 10*3/uL (ref 0.0–0.1)
Basophils Relative: 2 %
Eosinophils Absolute: 0.1 10*3/uL (ref 0.0–0.5)
Eosinophils Relative: 3 %
HCT: 45.6 % (ref 36.0–46.0)
Hemoglobin: 14 g/dL (ref 12.0–15.0)
Immature Granulocytes: 0 %
Lymphocytes Relative: 37 %
Lymphs Abs: 0.9 10*3/uL (ref 0.7–4.0)
MCH: 25 pg — ABNORMAL LOW (ref 26.0–34.0)
MCHC: 30.7 g/dL (ref 30.0–36.0)
MCV: 81.4 fL (ref 80.0–100.0)
Monocytes Absolute: 1.2 10*3/uL — ABNORMAL HIGH (ref 0.1–1.0)
Monocytes Relative: 49 %
Neutro Abs: 0.2 10*3/uL — CL (ref 1.7–7.7)
Neutrophils Relative %: 9 %
Platelets: 282 10*3/uL (ref 150–400)
RBC: 5.6 MIL/uL — ABNORMAL HIGH (ref 3.87–5.11)
RDW: 17.2 % — ABNORMAL HIGH (ref 11.5–15.5)
WBC: 2.5 10*3/uL — ABNORMAL LOW (ref 4.0–10.5)
nRBC: 0 % (ref 0.0–0.2)

## 2023-03-30 LAB — COMPREHENSIVE METABOLIC PANEL
ALT: 10 U/L (ref 0–44)
AST: 34 U/L (ref 15–41)
Albumin: 4.4 g/dL (ref 3.5–5.0)
Alkaline Phosphatase: 51 U/L (ref 38–126)
Anion gap: 9 (ref 5–15)
BUN: 16 mg/dL (ref 8–23)
CO2: 25 mmol/L (ref 22–32)
Calcium: 9.2 mg/dL (ref 8.9–10.3)
Chloride: 103 mmol/L (ref 98–111)
Creatinine, Ser: 0.61 mg/dL (ref 0.44–1.00)
GFR, Estimated: >60 mL/min (ref >60)
Glucose, Bld: 96 mg/dL (ref 70–99)
Potassium: 5.4 mmol/L — ABNORMAL HIGH (ref 3.5–5.1)
Sodium: 137 mmol/L (ref 135–145)
Total Bilirubin: 0.9 mg/dL (ref 0.3–1.2)
Total Protein: 7.8 g/dL (ref 6.5–8.1)

## 2023-03-30 LAB — GROUP A STREP BY PCR: Group A Strep by PCR: NOT DETECTED

## 2023-03-30 MED ORDER — NYSTATIN 100000 UNIT/ML MT SUSP: 10.0000 mL | Freq: Three times a day (TID) | OROMUCOSAL | 0 refills | Status: AC

## 2023-03-30 NOTE — ED Triage Notes (Signed)
"  Abscess on gum and called my pcp on Monday and was put on antibiotics, but my throat is sore and making it hard to swallow" per pt

## 2023-03-30 NOTE — ED Provider Notes (Signed)
[place] EMERGENCY DEPARTMENT AT [facility] Provider Note   CSN: [id number] Arrival date & time: 03/30/23  0818     History  Chief Complaint  Patient presents with   Sore Throat    Savannah Jackson is a 67 y.o. female.  Pt is a 67 y.o. 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  Patient reports that started at the left lower jaw and she felt a bit feverish on Sunday but has not had fever since that time.  She spoke with her PCP who started her on amoxicillin on Monday concerned that she may have a dental process going on but she was not seen in the office.  She has been on the amoxicillin which has decreased her appetite and caused her to have diarrhea but has not improved the pain in her mouth.  Now the pain radiates into her ear and on the left side of her throat.  She has no difficulty with breathing or swelling in her throat.  She feels like it is hard to swallow because of the pain.  She has not noticed any facial swelling.  No recent change in medications except for the amoxicillin.  The history is provided by the patient and medical records.  Sore Throat       Home Medications Prior to Admission medications   Medication Sig Start Date End Date Taking? Authorizing Provider  magic mouthwash (nystatin, lidocaine, diphenhydrAMINE, alum & mag hydroxide) suspension Swish and spit 10 mLs 3 (three) times daily. Use for the next week of until mouth ulcers improve 03/30/23  Yes Gwyneth Sprout, MD  acetaminophen (TYLENOL) 325 MG tablet Take 650 mg by mouth every 6 (six) hours as needed for headache or mild pain.    [provider]  aspirin  81 MG chewable tablet Chew 1 tablet (81 mg total) by mouth 2 (two) times daily. Patient taking differently: Chew 81 mg by mouth daily. 12/10/21   Kathryne Hitch, MD  atorvastatin (LIPITOR) 10 MG tablet Take 10 mg by mouth daily.  12/04/18   [provider]  Buprenorphine HCl 150 MCG FILM Take 150 mcg by mouth every other day. 10/17/21   [provider]  cholecalciferol (VITAMIN D) 1000 units tablet Take 1,000 Units by mouth daily.    [provider]  methocarbamol (ROBAXIN) 500 MG tablet Take 1 tablet (500 mg total) by mouth every 6 (six) hours as needed for muscle spasms. Patient not taking: Reported on 01/18/2023 12/10/21   Kathryne Hitch, MD  mycophenolate (MYFORTIC) 180 MG EC tablet Take 360 mg by mouth 2 (two) times daily.    [provider]  oxyCODONE (OXY IR/ROXICODONE) 5 MG immediate release tablet Take 1-2 tablets (5-10 mg total) by mouth every 4 (four) hours as needed for moderate pain (pain score 4-6). Patient not taking: Reported on 01/18/2023 12/10/21   Kathryne Hitch, MD  Oxycodone HCl 10 MG TABS Take 10 mg by mouth 5 (five) times daily as needed (pain). 01/12/23   [provider]  pantoprazole (PROTONIX) 40 MG tablet Take 40 mg by mouth daily.    [provider]  tacrolimus ER (ENVARSUS XR) 0.75 MG TB24 tablet Take 1.5 mg by mouth daily  before breakfast.    [provider]  tacrolimus ER (ENVARSUS XR) 1 MG TB24 Take 1 mg by mouth daily before breakfast.    [provider]      Allergies    Latex and Vicodin [hydrocodone-acetaminophen]    Review of Systems   Review of Systems  Physical Exam Updated Vital Signs BP (!) 152/93   Pulse 66   Temp 98.5 F (36.9 C) (Oral)   Resp 18   Ht 5\' 4"  (1.626 m)   Wt 79.4 kg   SpO2 98%   BMI 30.04 kg/m  Physical Exam Vitals and nursing note reviewed.  Constitutional:      General: She is not in acute distress.    Appearance: She is  well-developed.  HENT:     Head: Normocephalic and atraumatic.     Right Ear: Tympanic membrane normal.     Left Ear: Tympanic membrane normal.     Mouth/Throat:      Comments: Teeth all appear intact and healthy.  Minimal erythema on the left side of the pharynx but tonsils without exudate no notable swelling.  No fluctuance. Eyes:     Pupils: Pupils are equal, round, and reactive to light.  Cardiovascular:     Rate and Rhythm: Normal rate and regular rhythm.     Heart sounds: Normal heart sounds. No murmur heard.    No friction rub.  Pulmonary:     Effort: Pulmonary effort is normal.     Breath sounds: Normal breath sounds. No wheezing or rales.  Abdominal:     General: Bowel sounds are normal. There is no distension.     Palpations: Abdomen is soft.     Tenderness: There is no abdominal tenderness. There is no guarding or rebound.  Musculoskeletal:        General: No tenderness. Normal range of motion.     Comments: No edema  Lymphadenopathy:     Cervical: Cervical adenopathy present.  Skin:    General: Skin is warm and dry.     Findings: No rash.  Neurological:     Mental Status: She is alert and oriented to person, place, and time. Mental status is at baseline.     Cranial Nerves: No cranial nerve deficit.  Psychiatric:        Behavior: Behavior normal.     ED Results / Procedures / Treatments   Labs (all labs ordered are listed, but only abnormal results are displayed) Labs Reviewed  CBC WITH DIFFERENTIAL/PLATELET - Abnormal; Notable for the following components:      Result Value   WBC 2.5 (*)    RBC 5.60 (*)    MCH 25.0 (*)    RDW 17.2 (*)    Neutro Abs 0.2 (*)    Monocytes Absolute 1.2 (*)    All other components within normal limits  COMPREHENSIVE METABOLIC PANEL - Abnormal; Notable for the following components:   Potassium 5.4 (*)    All other components within normal limits  GROUP A STREP BY PCR    EKG None  Radiology No results  found.  Procedures Procedures    Medications Ordered in ED Medications - No data to display  ED Course/ Medical Decision Making/ A&P                             Medical Decision Making Amount and/or Complexity of Data Reviewed Labs: ordered. Decision-making details documented in ED Course.  Pt with multiple medical problems and comorbidities and presenting today with a complaint that caries a high risk for morbidity and mortality.  Here today with mouth pain.  Upon evaluation of patient's mouth she has most localized ulcerative lesions present at the posterior gums on the left lower jaw but also has some lesions around her teeth on the lower part of the mouth.  No tongue involvement, no evidence of peritonsillar abscess, pharyngitis, low suspicion for epiglottitis or retropharyngeal abscess.  Patient is immunocompromise given her complicated medical history and patient's symptoms appear to be viral in nature.  She was at a large event with multiple people prior to symptoms starting.  She has had diarrhea and decreased appetite since starting on amoxicillin which at this time feel that she should discontinue.  There is no evidence of dental infection or dental abscess at this time.  Findings are not consistent with thrush.  I independently interpreted patient's labs and her strep is negative, CMP with normal renal function and LFTs, CBC today with leukopenia with a white count of 2.5 and absolute neutrophils of 0.2.  Feel that patient's symptoms are viral but will also cover with nystatin but is not typical thrush appearance.  Findings discussed with the patient and her daughter who is present at bedside.  Will treat with Magic mouthwash and nystatin.  Feel that these will most likely resolve on their own.  Did encourage follow-up with PCP or transplant team if symptoms or not improving.         Final Clinical Impression(s) / ED Diagnoses Final diagnoses:  Gingivostomatitis    Rx / DC  Orders ED Discharge Orders          Ordered    magic mouthwash (nystatin, lidocaine, diphenhydrAMINE, alum & mag hydroxide) suspension  3 times daily        03/30/23 1104              Gwyneth Sprout, MD 03/30/23 1106

## 2023-03-30 NOTE — Discharge Instructions (Signed)
I found a magic mouthwash that had the nystatin in it so you can just use 1 mouthwash 3 times a day.  Continue to use the tylenol as needed for pain.  Avoid spicy, hot or acidic foods and careful with tooth brushing.  If you start having trouble swallowing or cannot eat return or any trouble breathing you should return to the ER.  Follow up with your doctor next week for recheck.  Stop amoxicillin.

## 2023-04-01 DIAGNOSIS — N048 Nephrotic syndrome with other morphologic changes: Principal | ICD-10-CM

## 2023-04-01 DIAGNOSIS — N042 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: Principal | ICD-10-CM

## 2023-04-01 DIAGNOSIS — N022 Recurrent and persistent hematuria with diffuse membranous glomerulonephritis: Principal | ICD-10-CM

## 2023-04-02 DIAGNOSIS — Z940 Kidney transplant status: Principal | ICD-10-CM

## 2023-04-02 DIAGNOSIS — D849 Immunodeficiency, unspecified: Principal | ICD-10-CM

## 2023-04-03 DIAGNOSIS — Z944 Liver transplant status: Principal | ICD-10-CM

## 2023-04-03 DIAGNOSIS — Z796 Long-term use of immunosuppressant medication: Principal | ICD-10-CM

## 2023-04-03 NOTE — Unmapped (Signed)
Lab orders placed for upcoming appointment with Dr. Sherryll Burger on 04/11/2023.

## 2023-04-04 ENCOUNTER — Telehealth: Payer: Self-pay

## 2023-04-04 DIAGNOSIS — Z940 Kidney transplant status: Principal | ICD-10-CM

## 2023-04-04 NOTE — Telephone Encounter (Signed)
Transition Care Management Unsuccessful Follow-up Telephone Call  Date of discharge and from where:  03/30/2023 [facility]  Attempts:  1st Attempt  Reason for unsuccessful TCM follow-up call:  No answer/busy  Taleshia Luff [facility]  [facility] Population Health Community Resource Care Guide   [email or web address]  ?? [phone]   Website: [email or web address]  [email or web address]

## 2023-04-04 NOTE — Telephone Encounter (Signed)
Transition Care Management Unsuccessful Follow-up Telephone Call  Date of discharge and from where:  03/30/2023 [facility]  Attempts:  2nd Attempt  Reason for unsuccessful TCM follow-up call:  No answer/busy  Dezaray Shibuya [facility]  [facility] Population Health Community Resource Care Guide   [email or web address]  ?? [phone]   Website: [email or web address]  [email or web address]

## 2023-04-08 DIAGNOSIS — E785 Hyperlipidemia, unspecified: Secondary | ICD-10-CM | POA: Diagnosis not present

## 2023-04-08 DIAGNOSIS — K219 Gastro-esophageal reflux disease without esophagitis: Secondary | ICD-10-CM | POA: Diagnosis not present

## 2023-04-10 NOTE — Unmapped (Signed)
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 She received a HCV Ab+/NAT- organ and has not developed chronic HCV in the post-transplant period. She does have recurrent proteinuria in her graft.     1. Immunosuppression: Envarsus 1.5 mg daily, Myfortic 360 mg BID  -Tac goal 3-5  -Repeat labs q3 months, but will defer to nephrology team if they want to do it more often     2. Donor liver Hep C Ab+/NAT: HCV VL remains negative     3. Chronic opioids for knee pain, interested in knee replacement, but on hold until her nephrotic syndrome is under more control.   -Oxycodone 10 mg QID + Belbuca 300 mcg film tab daily prescribed by Dr Mikael Spray      4. 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 Agree with ASA for thrombosis risk, would be okay with short-term anticoagulation after knee replacement if needed in the future     Preventative Health:   - Bone Health:  DEXA scan done 08/2018 was normal. Repeat due now (2 years)  - Dermatology: This patient is at increased risk for the development of skin cancers due to immunosuppressant medications. We recommend yearly surveillance. The patient has been informed of the same. Advised to schedule appointment with Dermatologist  - CRC screening: Colonoscopy done 08/07/2018 and showed one small adenoma. Next screening  In 7 years.  -Women's Health: Mammogram and PAP smear are up to date per patient report     Vaccinations: We recommend that patients have vaccinations to prevent various infections that can occur, especially in the setting of having underlying liver disease. The following vaccinations should be given:  -Hepatitis A: HAV IgG+  -Hepatitis B: Vaccinated   -Influenza (yearly): Due Fall 2024  -Pneumococcal: PPSV23  (2016), PCV13 (2019), Prevnar 20 (06/2021)  -Zoster: Shingrix   08/2018, 12/2019  -SARS-CoV-2: Moderna x 4, bivalent x 2, Spikevax 11/2022  -Tdap 05/2016  -RSV 12/2022    Return in about 1 year (around 04/10/2024).    Michelle Heidelberg, NP  [facility]  Subjective   History of Present Illness/Interval History  Accompanied by: cousin    The patient was last seen on 04/17/22. In the interim she's had a few events. Was admitted for rotavirus in late July 2023 which resolved. Then admitted in late Dec 2023 for rectal bleeding with colonoscopy showing only fair prep, but no active bleeding. Did have diverticulosis and hemorrhoids, thought to possibly be the source. Repeat colonoscopy on 01/24/23 locally showed diverticulosis and no other source of potential rectal bleeding. She has not had any recurrent rectal bleeding.    Objective   Physical Exam   Vital Signs: BP 133/81  - Pulse 53  - Temp 36.5 ??C (97.7 ??F) (Tympanic)  - Ht 162 cm (5' 3.78)  - Wt 78.6 kg (173 lb 4.8 oz)  - LMP  (LMP Unknown)  - SpO2 100%  - BMI 29.95  kg/m??     Constitutional: She is in no apparent distress  Eyes: Anicteric sclerae  Cardiovascular: No peripheral edema  Gastrointestinal: Soft, nontender abdomen without hepatosplenomegaly, hernias, or masses  Neurologic: Awake, alert, and oriented to person, place, and time with normal speech and no asterixis    Explant Data 09/13/2017:  A: Liver, native, explantation  - Hepatic cirrhosis (liver weight 870 g), clinically alcoholic cirrhosis.  - PAS-D stain positive for abnormal intracytoplasmic PAS-D positive inclusions (See Comment).  - Reticulin and trichrome stains confirmatory highlighting extensive hepatic fibrosis and cirrhosis.  - Iron stain negative for increased hepatocellular iron deposition.  - Negative for malignancy including the examined resection margins.  - Minimal steatosis involving approximately <1% of liver parenchyma.        Patient is taking immunosuppressive medications due to liver transplantation and requires monitoring of renal function for signs of toxicity  I personally spent 30 minutes face-to-face and non-face-to-face in the care of this patient, which includes all pre, intra, and post visit time on the date of service

## 2023-04-11 ENCOUNTER — Ambulatory Visit: Admit: 2023-04-11 | Discharge: 2023-04-11 | Payer: MEDICARE

## 2023-04-11 DIAGNOSIS — M25569 Pain in unspecified knee: Secondary | ICD-10-CM | POA: Diagnosis not present

## 2023-04-11 DIAGNOSIS — I120 Hypertensive chronic kidney disease with stage 5 chronic kidney disease or end stage renal disease: Secondary | ICD-10-CM | POA: Diagnosis not present

## 2023-04-11 DIAGNOSIS — R809 Proteinuria, unspecified: Secondary | ICD-10-CM | POA: Diagnosis not present

## 2023-04-11 DIAGNOSIS — E8881 Metabolic syndrome: Secondary | ICD-10-CM | POA: Diagnosis not present

## 2023-04-11 DIAGNOSIS — D849 Immunodeficiency, unspecified: Secondary | ICD-10-CM | POA: Diagnosis not present

## 2023-04-11 DIAGNOSIS — E785 Hyperlipidemia, unspecified: Secondary | ICD-10-CM | POA: Diagnosis not present

## 2023-04-11 DIAGNOSIS — Z944 Liver transplant status: Secondary | ICD-10-CM | POA: Diagnosis not present

## 2023-04-11 DIAGNOSIS — Z79891 Long term (current) use of opiate analgesic: Secondary | ICD-10-CM | POA: Diagnosis not present

## 2023-04-11 DIAGNOSIS — Z23 Encounter for immunization: Secondary | ICD-10-CM | POA: Diagnosis not present

## 2023-04-11 DIAGNOSIS — K7030 Alcoholic cirrhosis of liver without ascites: Secondary | ICD-10-CM | POA: Diagnosis not present

## 2023-04-11 LAB — COMPREHENSIVE METABOLIC PANEL
ALBUMIN: 3.3 g/dL — ABNORMAL LOW (ref 3.4–5.0)
ALKALINE PHOSPHATASE: 70 U/L (ref 46–116)
ALT (SGPT): <7 U/L — ABNORMAL LOW (ref 10–49)
ANION GAP: 7 mmol/L (ref 5–14)
AST (SGOT): 12 U/L (ref <=34)
BILIRUBIN TOTAL: 0.2 mg/dL — ABNORMAL LOW (ref 0.3–1.2)
BLOOD UREA NITROGEN: 13 mg/dL (ref 9–23)
BUN / CREAT RATIO: 23
CALCIUM: 9.1 mg/dL (ref 8.7–10.4)
CHLORIDE: 109 mmol/L — ABNORMAL HIGH (ref 98–107)
CO2: 25 mmol/L (ref 20.0–31.0)
CREATININE: 0.56 mg/dL
EGFR CKD-EPI (2021) FEMALE: >90 mL/min/{1.73_m2} (ref >=60)
GLUCOSE RANDOM: 102 mg/dL — ABNORMAL HIGH (ref 70–99)
POTASSIUM: 4.1 mmol/L (ref 3.4–4.8)
PROTEIN TOTAL: 6.7 g/dL (ref 5.7–8.2)
SODIUM: 141 mmol/L (ref 135–145)

## 2023-04-11 LAB — CBC W/ AUTO DIFF
BASOPHILS ABSOLUTE COUNT: 0.1 10*9/L (ref 0.0–0.1)
BASOPHILS RELATIVE PERCENT: 1.1 %
EOSINOPHILS ABSOLUTE COUNT: 0.2 10*9/L (ref 0.0–0.5)
EOSINOPHILS RELATIVE PERCENT: 4.6 %
HEMATOCRIT: 38.6 % (ref 34.0–44.0)
HEMOGLOBIN: 12.4 g/dL (ref 11.3–14.9)
LYMPHOCYTES ABSOLUTE COUNT: 2.1 10*9/L (ref 1.1–3.6)
LYMPHOCYTES RELATIVE PERCENT: 41.1 %
MEAN CORPUSCULAR HEMOGLOBIN CONC: 32.2 g/dL (ref 32.0–36.0)
MEAN CORPUSCULAR HEMOGLOBIN: 25.5 pg — ABNORMAL LOW (ref 25.9–32.4)
MEAN CORPUSCULAR VOLUME: 79 fL (ref 77.6–95.7)
MEAN PLATELET VOLUME: 8.4 fL (ref 6.8–10.7)
MONOCYTES ABSOLUTE COUNT: 2.2 10*9/L — ABNORMAL HIGH (ref 0.3–0.8)
MONOCYTES RELATIVE PERCENT: 42.4 %
NEUTROPHILS ABSOLUTE COUNT: 0.6 10*9/L — ABNORMAL LOW (ref 1.8–7.8)
NEUTROPHILS RELATIVE PERCENT: 10.8 %
PLATELET COUNT: 464 10*9/L — ABNORMAL HIGH (ref 150–450)
RED BLOOD CELL COUNT: 4.88 10*12/L (ref 3.95–5.13)
RED CELL DISTRIBUTION WIDTH: 18.2 % — ABNORMAL HIGH (ref 12.2–15.2)
WBC ADJUSTED: 5.2 10*9/L (ref 3.6–11.2)

## 2023-04-11 LAB — SLIDE REVIEW

## 2023-04-11 LAB — GAMMA GT: GAMMA GLUTAMYL TRANSFERASE: 32 U/L

## 2023-04-11 LAB — PHOSPHORUS: PHOSPHORUS: 3.3 mg/dL (ref 2.4–5.1)

## 2023-04-11 LAB — MAGNESIUM: MAGNESIUM: 1.5 mg/dL — ABNORMAL LOW (ref 1.6–2.6)

## 2023-04-11 LAB — TACROLIMUS LEVEL, TROUGH: TACROLIMUS, TROUGH: 3.5 ng/mL — ABNORMAL LOW (ref 5.0–15.0)

## 2023-04-11 LAB — BILIRUBIN, DIRECT: BILIRUBIN DIRECT: <0.1 mg/dL (ref 0.00–0.30)

## 2023-04-11 NOTE — Unmapped (Signed)
Post Liver Transplant Follow-Up    Transplant Date: 09/13/2017 (Liver), 09/13/2017 (Kidney)    Pt was seen in clinic with Michelle Heidelberg, NP. Pt was accompanied by her cousin, Michelle Travis. Pt is currently retired. She reported no medical issues in the last year. She did note that she has needed some iron infusions this year with the last one being done on 5/132024. Pt has an established PCP, Dr. Benetta Travis.  Pt will contact her PCP for assistance in getting a Dexa Scan completed and for a referral to a local dermatologist to establish care and have a full skin cancer screening. Labs obtained at [facility] in [place], [place], IS refilled via [facility] pharmacy. Medication list reconciled and reviewed. Vaccines up to date. Ms. Michelle Spar NP recommended the pt have a rpeat series of Hepatitis vaccines and received the first shot of the 2 part series for Hepatitis B today. Pt will follow up with her PCP to get the second shot within one month. Screenings are up to date. Pt reported that she had two colonoscopies in the past year: 10/25/22 and in February or March 2024. She was told that she had diverticulitis and hemorrhoids. Pt denied EtOH, tobacco, recreational drugs. Pt to return for a follow up appointment with Dr. Sherryll Travis in one year.     Provided 15 minutes of patient education reviewing the following: preventative care, vaccinations, nutrition, and over the counter medications. Pt verbalized understanding of all topics discussed.

## 2023-04-11 NOTE — Unmapped (Addendum)
You received the first dose of the Hepatitis B vaccine today (2 part series). Please get the second shot of the Hepatitis B vaccine in one month (05/11/2023).    You will be contacted to schedule an annual visit with Dr. Sherryll Burger in one year (June 2025).    Please contact your PCP for a referral to a local dermatologist to establish care and have a skin cancer screening.    Please also contact your PCP for assistance in scheduling a Dexa Bone Scan (last had in 08/2018 - due now).

## 2023-04-11 NOTE — Unmapped (Signed)
Per provider, the patient received Hepatitis B vaccine.  Patient ID verified with name and date of birth.  All screening questions were answered.  Vaccine(s) were administered as ordered.  See immunization history for documentation.  Patient tolerated the injection(s) well with no issues noted.  Vaccine Information sheet given to the patient.

## 2023-04-12 DIAGNOSIS — M545 Low back pain, unspecified: Secondary | ICD-10-CM | POA: Diagnosis not present

## 2023-04-12 DIAGNOSIS — E7800 Pure hypercholesterolemia, unspecified: Secondary | ICD-10-CM | POA: Diagnosis not present

## 2023-04-12 DIAGNOSIS — Z131 Encounter for screening for diabetes mellitus: Secondary | ICD-10-CM | POA: Diagnosis not present

## 2023-04-12 DIAGNOSIS — M129 Arthropathy, unspecified: Secondary | ICD-10-CM | POA: Diagnosis not present

## 2023-04-12 DIAGNOSIS — E559 Vitamin D deficiency, unspecified: Secondary | ICD-10-CM | POA: Diagnosis not present

## 2023-04-12 DIAGNOSIS — M25562 Pain in left knee: Secondary | ICD-10-CM | POA: Diagnosis not present

## 2023-04-12 DIAGNOSIS — R030 Elevated blood-pressure reading, without diagnosis of hypertension: Secondary | ICD-10-CM | POA: Diagnosis not present

## 2023-04-12 DIAGNOSIS — Z6829 Body mass index (BMI) 29.0-29.9, adult: Secondary | ICD-10-CM | POA: Diagnosis not present

## 2023-04-12 DIAGNOSIS — Z79899 Other long term (current) drug therapy: Secondary | ICD-10-CM | POA: Diagnosis not present

## 2023-04-12 NOTE — Unmapped (Signed)
Reviewed WBC 5.2 and ANC 0.6 with [facility], CPP - ok to monitor on next set of labs.

## 2023-04-17 ENCOUNTER — Encounter (HOSPITAL_COMMUNITY): Payer: Self-pay | Admitting: Gerontology

## 2023-04-18 DIAGNOSIS — Z79899 Other long term (current) drug therapy: Secondary | ICD-10-CM | POA: Diagnosis not present

## 2023-04-30 DIAGNOSIS — D849 Immunodeficiency, unspecified: Principal | ICD-10-CM

## 2023-04-30 DIAGNOSIS — Z940 Kidney transplant status: Principal | ICD-10-CM

## 2023-05-02 DIAGNOSIS — Z1283 Encounter for screening for malignant neoplasm of skin: Secondary | ICD-10-CM | POA: Diagnosis not present

## 2023-05-02 DIAGNOSIS — D2221 Melanocytic nevi of right ear and external auricular canal: Secondary | ICD-10-CM | POA: Diagnosis not present

## 2023-05-02 DIAGNOSIS — D485 Neoplasm of uncertain behavior of skin: Secondary | ICD-10-CM | POA: Diagnosis not present

## 2023-05-02 DIAGNOSIS — D225 Melanocytic nevi of trunk: Secondary | ICD-10-CM | POA: Diagnosis not present

## 2023-05-10 DIAGNOSIS — Z6829 Body mass index (BMI) 29.0-29.9, adult: Secondary | ICD-10-CM | POA: Diagnosis not present

## 2023-05-10 DIAGNOSIS — M25562 Pain in left knee: Secondary | ICD-10-CM | POA: Diagnosis not present

## 2023-05-10 DIAGNOSIS — C50919 Malignant neoplasm of unspecified site of unspecified female breast: Secondary | ICD-10-CM | POA: Diagnosis not present

## 2023-05-10 DIAGNOSIS — Z944 Liver transplant status: Secondary | ICD-10-CM | POA: Diagnosis not present

## 2023-05-10 DIAGNOSIS — Z79899 Other long term (current) drug therapy: Secondary | ICD-10-CM | POA: Diagnosis not present

## 2023-05-10 DIAGNOSIS — M545 Low back pain, unspecified: Secondary | ICD-10-CM | POA: Diagnosis not present

## 2023-05-10 DIAGNOSIS — Z6828 Body mass index (BMI) 28.0-28.9, adult: Secondary | ICD-10-CM | POA: Diagnosis not present

## 2023-05-10 DIAGNOSIS — R030 Elevated blood-pressure reading, without diagnosis of hypertension: Secondary | ICD-10-CM | POA: Diagnosis not present

## 2023-05-11 NOTE — Unmapped (Signed)
Attempted to return pts page at given [phone], person that answered said she was not home. Called cell #[phone] but went straight to vm.

## 2023-05-11 NOTE — Unmapped (Addendum)
Received a phone call from the pt  that she had gone to [facility] today to get the second Hepatitis B vaccine but was informed that it would need pre authorization. She provided TNC with the phone number for the [facility]: [phone]    Called [facility] and spoke with the pharmacist. She stated that Medicare Part D would not cover the cost of the injection when they tried billing it for the pt. She tried billing through Part B while TNC was on the phone but reported that she got a message that Part B may cover the injection but pre-authorization was required for receiving it at a retail site.    A call was placed to the pt and an update was provided. Informed the pt that she can receive the Hepatitis B injection when she attends her upcoming appointment with Dr. Margaretmary Bayley on 06/28/23. TNC will provide an update to her Kidney Transplant Coordinator. Pt verbalized her understanding.     An update was sent to [facility].    Request placed on checklist for TPA Mahalia Longest to schedule annual appointment with Dr. Sherryll Burger in June 2025.

## 2023-05-13 DIAGNOSIS — Z944 Liver transplant status: Principal | ICD-10-CM

## 2023-05-13 DIAGNOSIS — Z796 Long-term use of immunosuppressant medication: Principal | ICD-10-CM

## 2023-05-14 NOTE — Unmapped (Signed)
 Orders placed.

## 2023-05-17 ENCOUNTER — Other Ambulatory Visit (HOSPITAL_COMMUNITY): Payer: Self-pay | Admitting: Internal Medicine

## 2023-05-17 DIAGNOSIS — Z1231 Encounter for screening mammogram for malignant neoplasm of breast: Secondary | ICD-10-CM

## 2023-05-18 DIAGNOSIS — K219 Gastro-esophageal reflux disease without esophagitis: Secondary | ICD-10-CM | POA: Diagnosis not present

## 2023-05-18 DIAGNOSIS — Z940 Kidney transplant status: Secondary | ICD-10-CM | POA: Diagnosis not present

## 2023-05-18 DIAGNOSIS — M169 Osteoarthritis of hip, unspecified: Secondary | ICD-10-CM | POA: Diagnosis not present

## 2023-05-18 DIAGNOSIS — Z944 Liver transplant status: Secondary | ICD-10-CM | POA: Diagnosis not present

## 2023-05-18 DIAGNOSIS — Z796 Long-term use of immunosuppressant medication: Principal | ICD-10-CM

## 2023-05-18 MED ORDER — PANTOPRAZOLE 40 MG TABLET,DELAYED RELEASE
ORAL_TABLET | Freq: Every day | ORAL | 3 refills | 90 days
Start: 2023-05-18 — End: 2024-05-17

## 2023-05-18 MED ORDER — MYCOPHENOLATE SODIUM 180 MG TABLET,DELAYED RELEASE
ORAL_TABLET | Freq: Two times a day (BID) | ORAL | 3 refills | 90 days
Start: 2023-05-18 — End: 2024-05-17

## 2023-05-18 MED ORDER — ATORVASTATIN 10 MG TABLET
ORAL_TABLET | Freq: Every day | ORAL | 3 refills | 90 days
Start: 2023-05-18 — End: 2024-05-17

## 2023-05-18 MED ORDER — CHOLECALCIFEROL (VITAMIN D3) 25 MCG (1,000 UNIT) TABLET
ORAL_TABLET | Freq: Every day | ORAL | 3 refills | 90 days
Start: 2023-05-18 — End: 2024-05-17

## 2023-05-18 NOTE — Unmapped (Signed)
[facility] Refill Coordination Note    Specialty Medication(s) to be Shipped:   Transplant: Envarsus XR 1mg , Envarsus XR 0.75mg , and  mycophenolic acid 180mg     Other medication(s) to be shipped:  atorvastatin, Vit D, pantoprazole     Michelle Travis, DOB: 1956/03/13  Phone: [phone] (home)       All above HIPAA information was verified with patient.     Was a Nurse, learning disability used for this call? No    Completed refill call assessment today to schedule patient's medication shipment from the [facility] [phone]).  All relevant notes have been reviewed.     Specialty medication(s) and dose(s) confirmed: Regimen is correct and unchanged.   Changes to medications: Michelle Travis reports no changes at this time.  Changes to insurance: No  New side effects reported not previously addressed with a pharmacist or physician: None reported  Questions for the pharmacist: No    Confirmed patient received a Conservation officer, historic buildings and a Surveyor, mining with first shipment. The patient will receive a drug information handout for each medication shipped and additional FDA Medication Guides as required.       DISEASE/MEDICATION-SPECIFIC INFORMATION        N/A    SPECIALTY MEDICATION ADHERENCE     Medication Adherence    Patient reported X missed doses in the last month: 0  Specialty Medication: mycophenolate 180 MG EC tablet (MYFORTIC)  Patient is on additional specialty medications: Yes  Additional Specialty Medications: ENVARSUS XR 0.75 mg Tb24 extended release tablet (tacrolimus)  Patient Reported Additional Medication X Missed Doses in the Last Month: 0  Patient is on more than two specialty medications: Yes  Specialty Medication: ENVARSUS XR 1 mg Tb24 extended release tablet (tacrolimus)  Patient Reported Additional Medication X Missed Doses in the Last Month: 0  Informant: patient  Adherence tools used: patient uses a pill box to manage medications              Were doses missed due to medication being on hold? No    Mycophenolate 180 mg: 6-7 days of medicine on hand   ENVARSUS XR 0.75 mg Tb24 extended release tablet (tacrolimus) 6-7 days of medicine on hand   ENVARSUS XR 1 mg Tb24 extended release tablet (tacrolimus) 6-7 days of medicine on hand     REFERRAL TO PHARMACIST     Referral to the pharmacist: Not needed      [facility]     Shipping address confirmed in Epic.       Delivery Scheduled: Yes, Expected medication delivery date: 7/23.  However, Rx request for refills was sent to the provider as there are none remaining.     Medication will be delivered via UPS to the prescription address in Epic WAM.    Alwyn Pea   [facility] Specialty Technician

## 2023-05-21 DIAGNOSIS — Z79899 Other long term (current) drug therapy: Secondary | ICD-10-CM | POA: Diagnosis not present

## 2023-05-22 DIAGNOSIS — Z944 Liver transplant status: Principal | ICD-10-CM

## 2023-05-22 DIAGNOSIS — Z796 Long-term use of immunosuppressant medication: Principal | ICD-10-CM

## 2023-05-22 DIAGNOSIS — Z940 Kidney transplant status: Principal | ICD-10-CM

## 2023-05-22 MED ORDER — CHOLECALCIFEROL (VITAMIN D3) 25 MCG (1,000 UNIT) TABLET
ORAL_TABLET | Freq: Every day | ORAL | 3 refills | 90 days | Status: CP
Start: 2023-05-22 — End: 2024-05-21
  Filled 2023-05-22: qty 90, 90d supply, fill #0

## 2023-05-22 MED ORDER — ATORVASTATIN 10 MG TABLET
ORAL | 3 refills | 90 days | Status: CP
Start: 2023-05-22 — End: 2024-05-21
  Filled 2023-05-22: qty 90, 90d supply, fill #0

## 2023-05-22 MED ORDER — MYCOPHENOLATE SODIUM 180 MG TABLET,DELAYED RELEASE
ORAL_TABLET | Freq: Two times a day (BID) | ORAL | 3 refills | 90 days | Status: CP
Start: 2023-05-22 — End: 2024-05-21
  Filled 2023-05-22: qty 360, 90d supply, fill #0

## 2023-05-22 MED ORDER — PANTOPRAZOLE 40 MG TABLET,DELAYED RELEASE
ORAL_TABLET | Freq: Every day | ORAL | 3 refills | 90 days | Status: CP
Start: 2023-05-22 — End: 2024-05-21
  Filled 2023-08-20: qty 90, 90d supply, fill #0

## 2023-05-22 MED FILL — ENVARSUS XR 1 MG TABLET,EXTENDED RELEASE: ORAL | 90 days supply | Qty: 90 | Fill #1

## 2023-05-22 MED FILL — ENVARSUS XR 0.75 MG TABLET,EXTENDED RELEASE: ORAL | 90 days supply | Qty: 180 | Fill #1

## 2023-05-24 DIAGNOSIS — E785 Hyperlipidemia, unspecified: Secondary | ICD-10-CM | POA: Diagnosis not present

## 2023-05-24 DIAGNOSIS — Z944 Liver transplant status: Secondary | ICD-10-CM | POA: Diagnosis not present

## 2023-05-24 DIAGNOSIS — Z0001 Encounter for general adult medical examination with abnormal findings: Secondary | ICD-10-CM | POA: Diagnosis not present

## 2023-05-24 DIAGNOSIS — Z96652 Presence of left artificial knee joint: Secondary | ICD-10-CM | POA: Diagnosis not present

## 2023-05-28 DIAGNOSIS — H524 Presbyopia: Secondary | ICD-10-CM | POA: Diagnosis not present

## 2023-05-28 DIAGNOSIS — H52223 Regular astigmatism, bilateral: Secondary | ICD-10-CM | POA: Diagnosis not present

## 2023-05-28 DIAGNOSIS — Z961 Presence of intraocular lens: Secondary | ICD-10-CM | POA: Diagnosis not present

## 2023-05-28 DIAGNOSIS — H43393 Other vitreous opacities, bilateral: Secondary | ICD-10-CM | POA: Diagnosis not present

## 2023-05-28 DIAGNOSIS — D849 Immunodeficiency, unspecified: Principal | ICD-10-CM

## 2023-05-28 DIAGNOSIS — Z940 Kidney transplant status: Principal | ICD-10-CM

## 2023-06-01 ENCOUNTER — Encounter (HOSPITAL_COMMUNITY): Payer: Self-pay

## 2023-06-01 ENCOUNTER — Ambulatory Visit (HOSPITAL_COMMUNITY)
Admission: RE | Admit: 2023-06-01 | Discharge: 2023-06-01 | Disposition: A | Discharge status: Home / Self Care | Payer: 59 | Source: Ambulatory Visit | Attending: Internal Medicine | Admitting: Internal Medicine

## 2023-06-01 DIAGNOSIS — Z1231 Encounter for screening mammogram for malignant neoplasm of breast: Secondary | ICD-10-CM | POA: Diagnosis not present

## 2023-06-04 DIAGNOSIS — Z796 Long-term use of immunosuppressant medication: Principal | ICD-10-CM

## 2023-06-04 DIAGNOSIS — Z944 Liver transplant status: Principal | ICD-10-CM

## 2023-06-07 DIAGNOSIS — Z79899 Other long term (current) drug therapy: Secondary | ICD-10-CM | POA: Diagnosis not present

## 2023-06-07 DIAGNOSIS — R030 Elevated blood-pressure reading, without diagnosis of hypertension: Secondary | ICD-10-CM | POA: Diagnosis not present

## 2023-06-07 DIAGNOSIS — Z944 Liver transplant status: Secondary | ICD-10-CM | POA: Diagnosis not present

## 2023-06-07 DIAGNOSIS — C50919 Malignant neoplasm of unspecified site of unspecified female breast: Secondary | ICD-10-CM | POA: Diagnosis not present

## 2023-06-07 DIAGNOSIS — M545 Low back pain, unspecified: Secondary | ICD-10-CM | POA: Diagnosis not present

## 2023-06-07 DIAGNOSIS — M25562 Pain in left knee: Secondary | ICD-10-CM | POA: Diagnosis not present

## 2023-06-13 DIAGNOSIS — Z79899 Other long term (current) drug therapy: Secondary | ICD-10-CM | POA: Diagnosis not present

## 2023-06-18 DIAGNOSIS — E785 Hyperlipidemia, unspecified: Secondary | ICD-10-CM | POA: Diagnosis not present

## 2023-06-18 DIAGNOSIS — K219 Gastro-esophageal reflux disease without esophagitis: Secondary | ICD-10-CM | POA: Diagnosis not present

## 2023-06-25 DIAGNOSIS — D849 Immunodeficiency, unspecified: Principal | ICD-10-CM

## 2023-06-25 DIAGNOSIS — Z940 Kidney transplant status: Principal | ICD-10-CM

## 2023-06-27 DIAGNOSIS — R799 Abnormal finding of blood chemistry, unspecified: Principal | ICD-10-CM

## 2023-06-27 DIAGNOSIS — Z940 Kidney transplant status: Principal | ICD-10-CM

## 2023-06-27 DIAGNOSIS — R748 Abnormal levels of other serum enzymes: Principal | ICD-10-CM

## 2023-06-27 DIAGNOSIS — R8279 Other abnormal findings on microbiological examination of urine: Principal | ICD-10-CM

## 2023-06-28 ENCOUNTER — Ambulatory Visit: Admit: 2023-06-28 | Payer: MEDICARE | Attending: Nephrology | Primary: Nephrology

## 2023-06-28 ENCOUNTER — Ambulatory Visit: Admit: 2023-06-28 | Payer: MEDICARE

## 2023-06-29 ENCOUNTER — Ambulatory Visit: Admit: 2023-06-29 | Discharge: 2023-06-30 | Payer: MEDICARE

## 2023-06-29 DIAGNOSIS — R799 Abnormal finding of blood chemistry, unspecified: Secondary | ICD-10-CM | POA: Diagnosis not present

## 2023-06-29 DIAGNOSIS — Z940 Kidney transplant status: Secondary | ICD-10-CM | POA: Diagnosis not present

## 2023-06-29 DIAGNOSIS — R8279 Other abnormal findings on microbiological examination of urine: Secondary | ICD-10-CM | POA: Diagnosis not present

## 2023-06-29 DIAGNOSIS — R748 Abnormal levels of other serum enzymes: Principal | ICD-10-CM

## 2023-06-29 LAB — PHOSPHORUS: PHOSPHORUS: 3.1 mg/dL (ref 2.4–5.1)

## 2023-06-29 LAB — PROTEIN / CREATININE RATIO, URINE
CREATININE, URINE: 111.8 mg/dL
PROTEIN URINE: 30.4 mg/dL
PROTEIN/CREAT RATIO, URINE: 0.272

## 2023-06-29 LAB — URINALYSIS WITH MICROSCOPY
BACTERIA: NONE SEEN /HPF
BILIRUBIN UA: NEGATIVE
GLUCOSE UA: NEGATIVE
KETONES UA: NEGATIVE
LEUKOCYTE ESTERASE UA: NEGATIVE
NITRITE UA: NEGATIVE
PH UA: 5.5 (ref 5.0–9.0)
PROTEIN UA: 30 — AB
RBC UA: <1 /HPF (ref <=4)
SPECIFIC GRAVITY UA: 1.017 (ref 1.003–1.030)
SQUAMOUS EPITHELIAL: 2 /HPF (ref 0–5)
UROBILINOGEN UA: <2
WBC UA: 1 /HPF (ref 0–5)

## 2023-06-29 LAB — CBC W/ AUTO DIFF
BASOPHILS ABSOLUTE COUNT: 0.1 10*9/L (ref 0.0–0.1)
BASOPHILS RELATIVE PERCENT: 1.3 %
EOSINOPHILS ABSOLUTE COUNT: 0.2 10*9/L (ref 0.0–0.5)
EOSINOPHILS RELATIVE PERCENT: 3.3 %
HEMATOCRIT: 42.9 % (ref 34.0–44.0)
HEMOGLOBIN: 13.9 g/dL (ref 11.3–14.9)
LYMPHOCYTES ABSOLUTE COUNT: 1.9 10*9/L (ref 1.1–3.6)
LYMPHOCYTES RELATIVE PERCENT: 36.7 %
MEAN CORPUSCULAR HEMOGLOBIN CONC: 32.4 g/dL (ref 32.0–36.0)
MEAN CORPUSCULAR HEMOGLOBIN: 27.7 pg (ref 25.9–32.4)
MEAN CORPUSCULAR VOLUME: 85.6 fL (ref 77.6–95.7)
MEAN PLATELET VOLUME: 9.3 fL (ref 6.8–10.7)
MONOCYTES ABSOLUTE COUNT: 0.6 10*9/L (ref 0.3–0.8)
MONOCYTES RELATIVE PERCENT: 11.6 %
NEUTROPHILS ABSOLUTE COUNT: 2.5 10*9/L (ref 1.8–7.8)
NEUTROPHILS RELATIVE PERCENT: 47.1 %
PLATELET COUNT: 251 10*9/L (ref 150–450)
RED BLOOD CELL COUNT: 5.02 10*12/L (ref 3.95–5.13)
RED CELL DISTRIBUTION WIDTH: 15.2 % (ref 12.2–15.2)
WBC ADJUSTED: 5.2 10*9/L (ref 3.6–11.2)

## 2023-06-29 LAB — IRON & TIBC
IRON SATURATION: 27 % (ref 20–55)
IRON: 72 ug/dL
TOTAL IRON BINDING CAPACITY: 269 ug/dL (ref 250–425)

## 2023-06-29 LAB — COMPREHENSIVE METABOLIC PANEL
ALBUMIN: 4.1 g/dL (ref 3.4–5.0)
ALKALINE PHOSPHATASE: 83 U/L (ref 46–116)
ALT (SGPT): 23 U/L (ref 10–49)
ANION GAP: 7 mmol/L (ref 5–14)
AST (SGOT): 23 U/L (ref <=34)
BILIRUBIN TOTAL: 0.5 mg/dL (ref 0.3–1.2)
BLOOD UREA NITROGEN: 11 mg/dL (ref 9–23)
BUN / CREAT RATIO: 21
CALCIUM: 9.6 mg/dL (ref 8.7–10.4)
CHLORIDE: 105 mmol/L (ref 98–107)
CO2: 28.4 mmol/L (ref 20.0–31.0)
CREATININE: 0.53 mg/dL — ABNORMAL LOW
EGFR CKD-EPI (2021) FEMALE: >90 mL/min/{1.73_m2} (ref >=60)
GLUCOSE RANDOM: 96 mg/dL (ref 70–179)
POTASSIUM: 4.1 mmol/L (ref 3.4–4.8)
PROTEIN TOTAL: 7.1 g/dL (ref 5.7–8.2)
SODIUM: 140 mmol/L (ref 135–145)

## 2023-06-29 LAB — MAGNESIUM: MAGNESIUM: 1.5 mg/dL — ABNORMAL LOW (ref 1.6–2.6)

## 2023-06-29 LAB — ALBUMIN / CREATININE URINE RATIO
ALBUMIN QUANT URINE: 16.1 mg/dL
ALBUMIN/CREATININE RATIO: 144 ug/mg — ABNORMAL HIGH (ref 0.0–30.0)
CREATININE, URINE: 111.8 mg/dL

## 2023-06-29 LAB — GAMMA GT: GAMMA GLUTAMYL TRANSFERASE: 42 U/L — ABNORMAL HIGH

## 2023-06-29 LAB — FERRITIN: FERRITIN: 152.6 ng/mL

## 2023-06-29 LAB — TACROLIMUS LEVEL, TROUGH: TACROLIMUS, TROUGH: 4.2 ng/mL — ABNORMAL LOW (ref 5.0–15.0)

## 2023-07-02 DIAGNOSIS — Z944 Liver transplant status: Principal | ICD-10-CM

## 2023-07-02 DIAGNOSIS — Z796 Long-term use of immunosuppressant medication: Principal | ICD-10-CM

## 2023-07-06 DIAGNOSIS — Z940 Kidney transplant status: Secondary | ICD-10-CM | POA: Diagnosis not present

## 2023-07-06 DIAGNOSIS — D849 Immunodeficiency, unspecified: Secondary | ICD-10-CM | POA: Diagnosis not present

## 2023-07-06 LAB — CBC W/ DIFFERENTIAL
BANDED NEUTROPHILS ABSOLUTE COUNT: 0 10*3/uL (ref 0.0–0.1)
BASOPHILS ABSOLUTE COUNT: 0 10*3/uL (ref 0.0–0.2)
BASOPHILS RELATIVE PERCENT: 1 %
EOSINOPHILS ABSOLUTE COUNT: 0.2 10*3/uL (ref 0.0–0.4)
EOSINOPHILS RELATIVE PERCENT: 3 %
HEMATOCRIT: 44.1 % (ref 34.0–46.6)
HEMOGLOBIN: 13.8 g/dL (ref 11.1–15.9)
IMMATURE GRANULOCYTES: 0 %
LYMPHOCYTES ABSOLUTE COUNT: 1.9 10*3/uL (ref 0.7–3.1)
LYMPHOCYTES RELATIVE PERCENT: 32 %
MEAN CORPUSCULAR HEMOGLOBIN CONC: 31.3 g/dL — ABNORMAL LOW (ref 31.5–35.7)
MEAN CORPUSCULAR HEMOGLOBIN: 27.9 pg (ref 26.6–33.0)
MEAN CORPUSCULAR VOLUME: 89 fL (ref 79–97)
MONOCYTES ABSOLUTE COUNT: 0.6 10*3/uL (ref 0.1–0.9)
MONOCYTES RELATIVE PERCENT: 10 %
NEUTROPHILS ABSOLUTE COUNT: 3.2 10*3/uL (ref 1.4–7.0)
NEUTROPHILS RELATIVE PERCENT: 54 %
PLATELET COUNT: 259 10*3/uL (ref 150–450)
RED BLOOD CELL COUNT: 4.94 x10E6/uL (ref 3.77–5.28)
RED CELL DISTRIBUTION WIDTH: 14.2 % (ref 11.7–15.4)
WHITE BLOOD CELL COUNT: 5.9 10*3/uL (ref 3.4–10.8)

## 2023-07-07 LAB — COMPREHENSIVE METABOLIC PANEL
ALBUMIN: 4.5 g/dL (ref 3.9–4.9)
ALKALINE PHOSPHATASE: 75 IU/L (ref 44–121)
ALT (SGPT): 20 IU/L (ref 0–32)
AST (SGOT): 20 IU/L (ref 0–40)
BILIRUBIN TOTAL (MG/DL) IN SER/PLAS: 0.4 mg/dL (ref 0.0–1.2)
BLOOD UREA NITROGEN: 10 mg/dL (ref 8–27)
BUN / CREAT RATIO: 15 (ref 12–28)
CALCIUM: 9.3 mg/dL (ref 8.7–10.3)
CHLORIDE: 103 mmol/L (ref 96–106)
CO2: 23 mmol/L (ref 20–29)
CREATININE: 0.65 mg/dL (ref 0.57–1.00)
EGFR: 96 mL/min/{1.73_m2}
GLOBULIN, TOTAL: 2.2 g/dL (ref 1.5–4.5)
GLUCOSE: 105 mg/dL — ABNORMAL HIGH (ref 70–99)
POTASSIUM: 4.3 mmol/L (ref 3.5–5.2)
SODIUM: 143 mmol/L (ref 134–144)
TOTAL PROTEIN: 6.7 g/dL (ref 6.0–8.5)

## 2023-07-07 LAB — MAGNESIUM: MAGNESIUM: 1.6 mg/dL (ref 1.6–2.3)

## 2023-07-07 LAB — PHOSPHORUS: PHOSPHORUS, SERUM: 3 mg/dL (ref 3.0–4.3)

## 2023-07-08 DIAGNOSIS — N022 Recurrent and persistent hematuria with diffuse membranous glomerulonephritis: Principal | ICD-10-CM

## 2023-07-08 DIAGNOSIS — N048 Nephrotic syndrome with other morphologic changes: Principal | ICD-10-CM

## 2023-07-08 DIAGNOSIS — N042 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: Principal | ICD-10-CM

## 2023-07-09 LAB — TACROLIMUS LEVEL: TACROLIMUS BLOOD: 3 ng/mL (ref 2.0–20.0)

## 2023-07-12 DIAGNOSIS — R030 Elevated blood-pressure reading, without diagnosis of hypertension: Secondary | ICD-10-CM | POA: Diagnosis not present

## 2023-07-12 DIAGNOSIS — Z79899 Other long term (current) drug therapy: Secondary | ICD-10-CM | POA: Diagnosis not present

## 2023-07-12 DIAGNOSIS — M25562 Pain in left knee: Secondary | ICD-10-CM | POA: Diagnosis not present

## 2023-07-12 DIAGNOSIS — C50919 Malignant neoplasm of unspecified site of unspecified female breast: Secondary | ICD-10-CM | POA: Diagnosis not present

## 2023-07-12 DIAGNOSIS — M545 Low back pain, unspecified: Secondary | ICD-10-CM | POA: Diagnosis not present

## 2023-07-12 DIAGNOSIS — Z944 Liver transplant status: Secondary | ICD-10-CM | POA: Diagnosis not present

## 2023-07-12 NOTE — Unmapped (Signed)
Lab results from 07/06/23 were reviewed with Kidney Coordinator [facility]. No changes recommended at this time.

## 2023-07-13 DIAGNOSIS — Z23 Encounter for immunization: Secondary | ICD-10-CM | POA: Diagnosis not present

## 2023-07-17 DIAGNOSIS — Z79899 Other long term (current) drug therapy: Secondary | ICD-10-CM | POA: Diagnosis not present

## 2023-07-19 DIAGNOSIS — E785 Hyperlipidemia, unspecified: Secondary | ICD-10-CM | POA: Diagnosis not present

## 2023-07-19 DIAGNOSIS — K219 Gastro-esophageal reflux disease without esophagitis: Secondary | ICD-10-CM | POA: Diagnosis not present

## 2023-07-20 ENCOUNTER — Ambulatory Visit (INDEPENDENT_AMBULATORY_CARE_PROVIDER_SITE_OTHER): Payer: 59 | Admitting: Orthopaedic Surgery

## 2023-07-20 ENCOUNTER — Other Ambulatory Visit (INDEPENDENT_AMBULATORY_CARE_PROVIDER_SITE_OTHER): Payer: 59

## 2023-07-20 DIAGNOSIS — M1612 Unilateral primary osteoarthritis, left hip: Secondary | ICD-10-CM

## 2023-07-20 NOTE — Progress Notes (Signed)
Office Visit Note   Patient: Savannah Jackson           Date of Birth: October 03, 1956           MRN: [id number] Visit Date: 07/20/2023              Requested by: Benetta Spar, MD [address] [place],  [place] [address] PCP: Benetta Spar, MD   Assessment & Plan: Visit Diagnoses:  1. Unilateral primary osteoarthritis, left hip     Plan: Impression is left hip osteoarthritis.  We have discussed various treatment options to include intra-articular cortisone injection for which she would like to proceed.  We will make a referral to Dr. Shon Baton for this.  She will follow-up with [place] as needed.  Follow-Up Instructions: Return for f/u with Dr. Shon Baton for left hip injection.   Orders:  Orders Placed This Encounter  Procedures   XR HIP UNILAT W OR W/O PELVIS 2-3 VIEWS LEFT   No orders of the defined types were placed in this encounter.     Procedures: No procedures performed   Clinical Data: No additional findings.   Subjective: Chief Complaint  Patient presents with   Left Hip - Pain    HPI patient is a pleasant 67 year old female who comes in today with left hip pain that has been ongoing for a while.  She notes worsening symptoms over the past few weeks.  The pain she has is to the groin and is worse with walking.  She has been taking Tylenol as well as oxycodone for pain management which does not seem to help the sharp shooting pains.  No previous cortisone injection to the left hip.  She is status post right total hip replacement by Dr. Magnus Ivan and is doing well there.  Review of Systems as detailed in HPI.  All others reviewed and are negative.   Objective: Vital Signs: There were no vitals taken for this visit.  Physical Exam well-developed well-nourished female no acute distress.  Alert and oriented x 3.  Ortho Exam left hip exam: Tenderness with logroll, FADIR and Stinchfield testing.  Specialty Comments:  No specialty comments  available.  Imaging: XR HIP UNILAT W OR W/O PELVIS 2-3 VIEWS LEFT  Result Date: 07/20/2023 X-rays demonstrate joint space narrowing with para-articular osteophyte formation of the left hip joint    PMFS History: Patient Active Problem List   Diagnosis Date Noted   Unilateral primary osteoarthritis, left knee 04/20/2022   Status post total hip replacement, right 12/09/2021   Unilateral primary osteoarthritis, right hip 09/26/2021   Immunosuppressed status (HCC) 03/22/2020   Encounter for gynecological examination with Papanicolaou smear of cervix 11/13/2019   Screening for colorectal cancer 11/13/2019   History of colonic polyps 05/17/2018   Aftercare following organ transplant 10/17/2017   Kidney transplanted 09/14/2017   Status post liver transplantation (HCC) 09/14/2017   Acute encephalopathy    Hyperammonemia (HCC) 07/15/2017   ESRD on dialysis (HCC)    Rectus sheath hematoma 11/22/2016   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   S/P thoracentesis    Acute renal failure superimposed on stage 4 chronic kidney disease (HCC)    Hypoalbuminemia 10/29/2016   Leukocytosis 10/29/2016   Diastolic dysfunction with chronic heart failure (HCC) 10/29/2016   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   Chronic kidney disease (CKD) stage G3b/A3, moderately decreased glomerular filtration rate (GFR) between 30-44 mL/min/1.73 square meter and albuminuria creatinine ratio greater than 300 mg/g (HCC) 08/30/2016  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 08/30/2016   Thickened endometrium 06/19/2016   PMB (postmenopausal bleeding) 06/07/2016   History of breast cancer 06/07/2016   Cough    Esophageal varices (HCC) 12/21/2015   Alcoholic cirrhosis of liver 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)    Anemia of chronic disease    Peripheral edema 11/26/2015   Anasarca 11/26/2015   Metabolic acidosis 10/19/2015   UTI (lower urinary tract infection) 09/24/2015   Elevated  troponin 09/24/2015   Altered mental status 08/30/2015   Hepatic encephalopathy (HCC) 08/30/2015   Acute respiratory failure with hypoxia (HCC)    Recurrent right pleural effusion    Alcoholic cirrhosis of liver with ascites (HCC)    Pleural effusion associated with hepatic disorder 07/03/2015   Chronic kidney disease (CKD), stage IV (severe) (HCC) 07/03/2015   Elevated INR 07/03/2015   Acute respiratory distress 07/03/2015   BRBPR (bright red blood per rectum) 04/21/2015   Pleural effusion, right 04/21/2015   Sleep apnea    Chronic back pain    ETOH abuse    Bilateral leg edema    Cirrhosis, alcoholic (HCC) 02/17/2013   Abdominal pain, left lower quadrant 02/17/2013   Diverticulitis of colon without hemorrhage 02/17/2013   Hypertension    Past Medical History:  Diagnosis Date   Bilateral leg edema    Brain aneurysm 1998   Cancer (HCC) 2002   breast cancer- lumpectromy, , chemotherapy, radiation   Chronic back pain Diverticultis   Chronic kidney disease (CKD) stage G3b/A3, moderately decreased glomerular filtration rate (GFR) between 30-44 mL/min/1.73 square meter and albuminuria creatinine ratio greater than 300 mg/g (HCC) 08/30/2016   Cirrhosis (HCC)    alcoholic   DDD (degenerative disc disease), lumbar    Diverticulosis    scope 2014   ETOH abuse    Hepatic encephalopathy (HCC) 04/23/2017   Hepatomegaly    scope 2014   History of breast cancer 06/07/2016   Hypertension    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   Pneumonia    Rotator cuff syndrome of left shoulder    Sleep apnea    does not wear CPAP   Steal syndrome as complication of dialysis access (HCC)    Thickened endometrium 06/19/2016   Will get endo biopsy    Wears glasses     Family History  Problem Relation Age of Onset   Aneurysm Mother    Other Daughter        knee replacement   Hypertension Daughter     Past  Surgical History:  Procedure Laterality Date   AV FISTULA PLACEMENT Left 05/22/2017   Procedure: BRACHIAL ARTERY TO BASCILIC VEIN  FISTULA CREATION;  Surgeon: Sherren Kerns, MD;  Location: [facility] OR;  Service: Vascular;  Laterality: Left;   BASCILIC VEIN TRANSPOSITION Left 08/07/2017   Procedure: BASILIC VEIN TRANSPOSITION SECOND STAGE;  Surgeon: Sherren Kerns, MD;  Location: [facility] OR;  Service: Vascular;  Laterality: Left;   BRAIN SURGERY     aneurysm at [facility]   BREAST LUMPECTOMY Right 2002   CATARACT EXTRACTION Bilateral    [facility] 2 or 3 years ago   CHOLECYSTECTOMY     COLONOSCOPY N/A 04/10/2013   Procedure: COLONOSCOPY;  Surgeon: Malissa Hippo, MD;  Location: [facility];  Service: Endoscopy;  Laterality: N/A;  1030-rescheduled to 1200 Ann notified pt   COLONOSCOPY N/A 08/07/2018   Procedure: COLONOSCOPY;  Surgeon: Malissa Hippo, MD;  Location: [facility];  Service: Endoscopy;  Laterality: N/A;  930   COLONOSCOPY WITH PROPOFOL N/A 01/24/2023   Procedure: COLONOSCOPY WITH PROPOFOL;  Surgeon: Corbin Ade, MD;  Location: [facility];  Service: Endoscopy;  Laterality: N/A;  12:45 pm, asa 3   ESOPHAGOGASTRODUODENOSCOPY N/A 04/22/2015   Procedure: ESOPHAGOGASTRODUODENOSCOPY (EGD);  Surgeon: Malissa Hippo, MD;  [facility]: [facility];  Service: Endoscopy;  Laterality: N/A;   EYE SURGERY     IR GENERIC HISTORICAL  07/04/2016   IR RADIOLOGIST EVAL & MGMT 07/04/2016 Malachy Moan, MD [facility] INTERV RAD   IR RADIOLOGIST EVAL & MGMT  07/24/2017   KIDNEY SURGERY     KIDNEY TRANSPLANT  2018   KNEE ARTHROSCOPY Left    LIVER TRANSPLANT  2018   NEPHRECTOMY TRANSPLANTED ORGAN     POLYPECTOMY  08/07/2018   Procedure: POLYPECTOMY;  Surgeon: Malissa Hippo, MD;  Location: [facility];  Service: Endoscopy;;  colon    RADIOLOGY WITH ANESTHESIA N/A 07/16/2015   Procedure: TIPS;  Surgeon: Medication Radiologist, MD;  Location: [facility] OR;  Service: Radiology;  Laterality: N/A;   REVISON  OF ARTERIOVENOUS FISTULA Left 01/06/2019   Procedure: LEFT ARM LIGATION OF ARTERIOVENOUS FISTULA;  Surgeon: Sherren Kerns, MD;  Location: [facility] OR;  Service: Vascular;  Laterality: Left;   ROTATOR CUFF REPAIR Left    TOTAL HIP ARTHROPLASTY Right 12/09/2021   Procedure: RIGHT TOTAL HIP ARTHROPLASTY ANTERIOR APPROACH;  Surgeon: Kathryne Hitch, MD;  Location: [facility] ORS;  Service: Orthopedics;  Laterality: Right;   TOTAL KNEE ARTHROPLASTY Right    right. 2002   Social History   Occupational History   Occupation: disabled  Tobacco Use   Smoking status: Never   Smokeless tobacco: Never  Vaping Use   Vaping status: Never Used  Substance and Sexual Activity   Alcohol use: No    Alcohol/week: 0.0 standard drinks of alcohol    Comment: none since 03/2015   Drug use: Never   Sexual activity: Not Currently    Birth control/protection: Post-menopausal, Abstinence

## 2023-07-23 DIAGNOSIS — D485 Neoplasm of uncertain behavior of skin: Secondary | ICD-10-CM | POA: Diagnosis not present

## 2023-07-23 DIAGNOSIS — D849 Immunodeficiency, unspecified: Principal | ICD-10-CM

## 2023-07-23 DIAGNOSIS — Z940 Kidney transplant status: Principal | ICD-10-CM

## 2023-07-30 DIAGNOSIS — Z944 Liver transplant status: Principal | ICD-10-CM

## 2023-07-30 DIAGNOSIS — Z796 Long-term use of immunosuppressant medication: Principal | ICD-10-CM

## 2023-08-01 ENCOUNTER — Ambulatory Visit (INDEPENDENT_AMBULATORY_CARE_PROVIDER_SITE_OTHER): Payer: 59 | Admitting: Sports Medicine

## 2023-08-01 ENCOUNTER — Other Ambulatory Visit: Payer: Self-pay

## 2023-08-01 ENCOUNTER — Encounter: Payer: Self-pay | Admitting: Sports Medicine

## 2023-08-01 DIAGNOSIS — D849 Immunodeficiency, unspecified: Secondary | ICD-10-CM | POA: Diagnosis not present

## 2023-08-01 DIAGNOSIS — M1612 Unilateral primary osteoarthritis, left hip: Secondary | ICD-10-CM | POA: Diagnosis not present

## 2023-08-01 DIAGNOSIS — Z7960 Long term (current) use of unspecified immunomodulators and immunosuppressants: Secondary | ICD-10-CM | POA: Diagnosis not present

## 2023-08-01 DIAGNOSIS — Z944 Liver transplant status: Secondary | ICD-10-CM | POA: Diagnosis not present

## 2023-08-01 DIAGNOSIS — Z940 Kidney transplant status: Secondary | ICD-10-CM | POA: Diagnosis not present

## 2023-08-01 LAB — CBC W/ DIFFERENTIAL
BANDED NEUTROPHILS ABSOLUTE COUNT: 0 10*3/uL (ref 0.0–0.1)
BASOPHILS ABSOLUTE COUNT: 0 10*3/uL (ref 0.0–0.2)
BASOPHILS RELATIVE PERCENT: 1 %
EOSINOPHILS ABSOLUTE COUNT: 0.1 10*3/uL (ref 0.0–0.4)
EOSINOPHILS RELATIVE PERCENT: 2 %
HEMATOCRIT: 45.5 % (ref 34.0–46.6)
HEMOGLOBIN: 14.3 g/dL (ref 11.1–15.9)
IMMATURE GRANULOCYTES: 0 %
LYMPHOCYTES ABSOLUTE COUNT: 1.4 10*3/uL (ref 0.7–3.1)
LYMPHOCYTES RELATIVE PERCENT: 35 %
MEAN CORPUSCULAR HEMOGLOBIN CONC: 31.4 g/dL — ABNORMAL LOW (ref 31.5–35.7)
MEAN CORPUSCULAR HEMOGLOBIN: 28.4 pg (ref 26.6–33.0)
MEAN CORPUSCULAR VOLUME: 90 fL (ref 79–97)
MONOCYTES ABSOLUTE COUNT: 0.4 10*3/uL (ref 0.1–0.9)
MONOCYTES RELATIVE PERCENT: 10 %
NEUTROPHILS ABSOLUTE COUNT: 2.2 10*3/uL (ref 1.4–7.0)
NEUTROPHILS RELATIVE PERCENT: 52 %
PLATELET COUNT: 287 10*3/uL (ref 150–450)
RED BLOOD CELL COUNT: 5.04 x10E6/uL (ref 3.77–5.28)
RED CELL DISTRIBUTION WIDTH: 13.7 % (ref 11.7–15.4)
WHITE BLOOD CELL COUNT: 4.1 10*3/uL (ref 3.4–10.8)

## 2023-08-01 MED ORDER — METHYLPREDNISOLONE ACETATE 40 MG/ML IJ SUSP
80.0000 mg | INTRAMUSCULAR | Status: AC | PRN
Start: 2023-08-01 — End: 2023-08-01
  Administered 2023-08-01: 80 mg via INTRA_ARTICULAR

## 2023-08-01 MED ORDER — LIDOCAINE HCL 1 % IJ SOLN
4.0000 mL | INTRAMUSCULAR | Status: AC | PRN
Start: 2023-08-01 — End: 2023-08-01
  Administered 2023-08-01: 4 mL

## 2023-08-01 NOTE — Progress Notes (Signed)
Procedure Note  Patient: Savannah Jackson             Date of Birth: 17-Sep-1956           MRN: [id number]             Visit Date: 08/01/2023  Procedures: Visit Diagnoses:  1. Unilateral primary osteoarthritis, left hip    Large Joint Inj: L hip joint on 08/01/2023 8:56 AM Indications: pain Details: 22 G 3.5 in needle, ultrasound-guided anterior approach Medications: 4 mL lidocaine 1 %; 80 mg methylPREDNISolone acetate 40 MG/ML Outcome: tolerated well, no immediate complications  Procedure: US-guided intra-articular hip injection, left After discussion on risks/benefits/indications and informed verbal consent was obtained, a timeout was performed. Patient was lying supine on exam table. The hip was cleaned with betadine and alcohol swabs. Then utilizing ultrasound guidance, the patient's femoral head and neck junction was identified and subsequently injected with 4:2 lidocaine:depomedrol via an in-plane approach with ultrasound visualization of the injectate administered into the hip joint. Patient tolerated procedure well without immediate complications.  Procedure, treatment alternatives, risks and benefits explained, specific risks discussed. Consent was given by the patient. Immediately prior to procedure a time out was called to verify the correct patient, procedure, equipment, support staff and site/side marked as required. Patient was prepped and draped in the usual sterile fashion.     - I evaluated the patient about 5 minutes post-injection and she had improvement in pain and range of motion - follow-up with Dr. Roda Shutters as indicated; I am happy to see them as needed  Madelyn Brunner, DO Primary Care Sports Medicine Physician  [facility]  This note was dictated using Dragon naturally speaking software and may contain errors in syntax, spelling, or content which have not been identified prior to signing this note.

## 2023-08-02 LAB — COMPREHENSIVE METABOLIC PANEL
ALBUMIN: 4.6 g/dL (ref 3.9–4.9)
ALKALINE PHOSPHATASE: 65 IU/L (ref 44–121)
ALT (SGPT): 21 IU/L (ref 0–32)
AST (SGOT): 22 IU/L (ref 0–40)
BILIRUBIN TOTAL (MG/DL) IN SER/PLAS: 0.6 mg/dL (ref 0.0–1.2)
BLOOD UREA NITROGEN: 12 mg/dL (ref 8–27)
BUN / CREAT RATIO: 21 (ref 12–28)
CALCIUM: 9.7 mg/dL (ref 8.7–10.3)
CHLORIDE: 106 mmol/L (ref 96–106)
CO2: 22 mmol/L (ref 20–29)
CREATININE: 0.56 mg/dL — ABNORMAL LOW (ref 0.57–1.00)
EGFR: 100 mL/min/{1.73_m2}
GLOBULIN, TOTAL: 2.4 g/dL (ref 1.5–4.5)
GLUCOSE: 93 mg/dL (ref 70–99)
POTASSIUM: 4.7 mmol/L (ref 3.5–5.2)
SODIUM: 144 mmol/L (ref 134–144)
TOTAL PROTEIN: 7 g/dL (ref 6.0–8.5)

## 2023-08-02 LAB — BILIRUBIN, DIRECT: BILIRUBIN DIRECT: 0.14 mg/dL (ref 0.00–0.40)

## 2023-08-02 LAB — GAMMA GT: GAMMA GLUTAMYL TRANSFERASE: 24 IU/L (ref 0–60)

## 2023-08-02 LAB — PHOSPHORUS: PHOSPHORUS, SERUM: 3.1 mg/dL (ref 3.0–4.3)

## 2023-08-02 LAB — MAGNESIUM: MAGNESIUM: 1.6 mg/dL (ref 1.6–2.3)

## 2023-08-03 LAB — TACROLIMUS LEVEL: TACROLIMUS BLOOD: 4.3 ng/mL (ref 2.0–20.0)

## 2023-08-05 DIAGNOSIS — N048 Nephrotic syndrome with other morphologic changes: Principal | ICD-10-CM

## 2023-08-05 DIAGNOSIS — N042 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: Principal | ICD-10-CM

## 2023-08-05 DIAGNOSIS — N022 Recurrent and persistent hematuria with diffuse membranous glomerulonephritis: Principal | ICD-10-CM

## 2023-08-07 NOTE — Unmapped (Signed)
[facility] Refill Coordination Note    Specialty Medication(s) to be Shipped:   Transplant: Envarsus 1mg , Envarsus 0.75mg , and mycophenolate mofetil 180mg     Other medication(s) to be shipped:  atorvastatin, pantoprazole, and Vit D     Michelle Travis, DOB: [birth year]  Phone: [phone] (home)       All above HIPAA information was verified with patient's family member, granddaughter.     Was a Nurse, learning disability used for this call? No    Completed refill call assessment today to schedule patient's medication shipment from the [facility]  [phone]).  All relevant notes have been reviewed.     Specialty medication(s) and dose(s) confirmed: Regimen is correct and unchanged.   Changes to medications: Zaiya reports no changes at this time.  Changes to insurance: No  New side effects reported not previously addressed with a pharmacist or physician: None reported  Questions for the pharmacist: No    Confirmed patient received a Conservation officer, historic buildings and a Surveyor, mining with first shipment. The patient will receive a drug information handout for each medication shipped and additional FDA Medication Guides as required.       DISEASE/MEDICATION-SPECIFIC INFORMATION        N/A    SPECIALTY MEDICATION ADHERENCE     Medication Adherence    Patient reported X missed doses in the last month: 0  Specialty Medication: mycophenolate 180 MG EC tablet (MYFORTIC)  Patient is on additional specialty medications: Yes  Additional Specialty Medications: ENVARSUS XR 0.75 mg Tb24 extended release tablet (tacrolimus)  Patient Reported Additional Medication X Missed Doses in the Last Month: 0  Patient is on more than two specialty medications: Yes  Specialty Medication: ENVARSUS XR 1 mg Tb24 extended release tablet (tacrolimus)  Patient Reported Additional Medication X Missed Doses in the Last Month: 0  Any gaps in refill history greater than 2 weeks in the last 3 months: no  Demonstrates understanding of importance of adherence: yes  Informant: other relative  Adherence tools used: patient uses a pill box to manage medications  Confirmed plan for next specialty medication refill: delivery by pharmacy  Refills needed for supportive medications: not needed          Refill Coordination    Has the Patients' Contact Information Changed: No  Is the Shipping Address Different: No         Were doses missed due to medication being on hold? No    ENVARSUS XR 0.75  mg: 14 days of medicine on hand   ENVARSUS XR 1  mg: 14 days of medicine on hand   mycophenolate 180  mg: 14 days of medicine on hand       REFERRAL TO PHARMACIST     Referral to the pharmacist: No - specialty medication dose was changed, however patient has been counseled and pharmacist has reviewed.      SHIPPING     Shipping address confirmed in Epic.       Delivery Scheduled: Yes, Expected medication delivery date: 08/15/23.     Medication will be delivered via UPS to the prescription address in Epic WAM.    Kerby Less   [facility]  Specialty Technician

## 2023-08-08 DIAGNOSIS — C50919 Malignant neoplasm of unspecified site of unspecified female breast: Secondary | ICD-10-CM | POA: Diagnosis not present

## 2023-08-08 DIAGNOSIS — Z944 Liver transplant status: Secondary | ICD-10-CM | POA: Diagnosis not present

## 2023-08-08 DIAGNOSIS — Z79899 Other long term (current) drug therapy: Secondary | ICD-10-CM | POA: Diagnosis not present

## 2023-08-08 DIAGNOSIS — M25562 Pain in left knee: Secondary | ICD-10-CM | POA: Diagnosis not present

## 2023-08-08 DIAGNOSIS — R030 Elevated blood-pressure reading, without diagnosis of hypertension: Secondary | ICD-10-CM | POA: Diagnosis not present

## 2023-08-08 DIAGNOSIS — M545 Low back pain, unspecified: Secondary | ICD-10-CM | POA: Diagnosis not present

## 2023-08-08 NOTE — Unmapped (Addendum)
Lab results from 08/01/23 were reviewed by Dr. Margaretmary Bayley as seen in Epic. Stable liver enzymes. Per Kidney coordinator De Blanch, Pt to obtain monthly labs. No changes recommended at this time.

## 2023-08-14 DIAGNOSIS — Z79899 Other long term (current) drug therapy: Secondary | ICD-10-CM | POA: Diagnosis not present

## 2023-08-14 NOTE — Unmapped (Signed)
Michelle Travis 's entire shipment will be delayed as a result of a high copay. AR account payment needed.    I have spoken with the patient  at [phone]  and communicated the delay. We will wait for a call back from the patient to reschedule the delivery.  We have not confirmed the new delivery date.

## 2023-08-15 ENCOUNTER — Ambulatory Visit: Admit: 2023-08-15 | Discharge: 2023-08-15 | Payer: MEDICARE | Attending: Nephrology | Primary: Nephrology

## 2023-08-15 ENCOUNTER — Ambulatory Visit: Admit: 2023-08-15 | Discharge: 2023-08-15 | Payer: MEDICARE

## 2023-08-15 DIAGNOSIS — K5790 Diverticulosis of intestine, part unspecified, without perforation or abscess without bleeding: Secondary | ICD-10-CM | POA: Diagnosis not present

## 2023-08-15 DIAGNOSIS — Z48298 Encounter for aftercare following other organ transplant: Secondary | ICD-10-CM | POA: Diagnosis not present

## 2023-08-15 DIAGNOSIS — I5032 Chronic diastolic (congestive) heart failure: Secondary | ICD-10-CM | POA: Diagnosis not present

## 2023-08-15 DIAGNOSIS — Z79899 Other long term (current) drug therapy: Secondary | ICD-10-CM | POA: Diagnosis not present

## 2023-08-15 DIAGNOSIS — I120 Hypertensive chronic kidney disease with stage 5 chronic kidney disease or end stage renal disease: Secondary | ICD-10-CM | POA: Diagnosis not present

## 2023-08-15 DIAGNOSIS — Z79620 Long term (current) use of immunosuppressive biologic: Secondary | ICD-10-CM | POA: Diagnosis not present

## 2023-08-15 DIAGNOSIS — G4739 Other sleep apnea: Secondary | ICD-10-CM | POA: Diagnosis not present

## 2023-08-15 DIAGNOSIS — R8279 Other abnormal findings on microbiological examination of urine: Secondary | ICD-10-CM | POA: Diagnosis not present

## 2023-08-15 DIAGNOSIS — N186 End stage renal disease: Secondary | ICD-10-CM | POA: Diagnosis not present

## 2023-08-15 DIAGNOSIS — Z96641 Presence of right artificial hip joint: Secondary | ICD-10-CM | POA: Diagnosis not present

## 2023-08-15 DIAGNOSIS — R809 Proteinuria, unspecified: Secondary | ICD-10-CM | POA: Diagnosis not present

## 2023-08-15 DIAGNOSIS — I10 Essential (primary) hypertension: Secondary | ICD-10-CM | POA: Diagnosis not present

## 2023-08-15 DIAGNOSIS — Z940 Kidney transplant status: Secondary | ICD-10-CM | POA: Diagnosis not present

## 2023-08-15 DIAGNOSIS — N271 Small kidney, bilateral: Secondary | ICD-10-CM | POA: Diagnosis not present

## 2023-08-15 DIAGNOSIS — J9811 Atelectasis: Secondary | ICD-10-CM | POA: Diagnosis not present

## 2023-08-15 DIAGNOSIS — D849 Immunodeficiency, unspecified: Secondary | ICD-10-CM | POA: Diagnosis not present

## 2023-08-15 DIAGNOSIS — Z853 Personal history of malignant neoplasm of breast: Secondary | ICD-10-CM | POA: Diagnosis not present

## 2023-08-15 DIAGNOSIS — I82409 Acute embolism and thrombosis of unspecified deep veins of unspecified lower extremity: Secondary | ICD-10-CM | POA: Diagnosis not present

## 2023-08-15 DIAGNOSIS — Z23 Encounter for immunization: Secondary | ICD-10-CM | POA: Diagnosis not present

## 2023-08-15 DIAGNOSIS — M7981 Nontraumatic hematoma of soft tissue: Secondary | ICD-10-CM | POA: Diagnosis not present

## 2023-08-15 DIAGNOSIS — M25562 Pain in left knee: Secondary | ICD-10-CM | POA: Diagnosis not present

## 2023-08-15 DIAGNOSIS — Z944 Liver transplant status: Secondary | ICD-10-CM | POA: Diagnosis not present

## 2023-08-15 DIAGNOSIS — R748 Abnormal levels of other serum enzymes: Principal | ICD-10-CM

## 2023-08-15 DIAGNOSIS — R799 Abnormal finding of blood chemistry, unspecified: Principal | ICD-10-CM

## 2023-08-15 LAB — CBC W/ AUTO DIFF
BASOPHILS ABSOLUTE COUNT: 0.1 10*9/L (ref 0.0–0.1)
BASOPHILS RELATIVE PERCENT: 1.1 %
EOSINOPHILS ABSOLUTE COUNT: 0.1 10*9/L (ref 0.0–0.5)
EOSINOPHILS RELATIVE PERCENT: 2.4 %
HEMATOCRIT: 41.2 % (ref 34.0–44.0)
HEMOGLOBIN: 13.7 g/dL (ref 11.3–14.9)
LYMPHOCYTES ABSOLUTE COUNT: 1.6 10*9/L (ref 1.1–3.6)
LYMPHOCYTES RELATIVE PERCENT: 25.6 %
MEAN CORPUSCULAR HEMOGLOBIN CONC: 33.2 g/dL (ref 32.0–36.0)
MEAN CORPUSCULAR HEMOGLOBIN: 28.8 pg (ref 25.9–32.4)
MEAN CORPUSCULAR VOLUME: 86.8 fL (ref 77.6–95.7)
MEAN PLATELET VOLUME: 9 fL (ref 6.8–10.7)
MONOCYTES ABSOLUTE COUNT: 0.5 10*9/L (ref 0.3–0.8)
MONOCYTES RELATIVE PERCENT: 8.8 %
NEUTROPHILS ABSOLUTE COUNT: 3.8 10*9/L (ref 1.8–7.8)
NEUTROPHILS RELATIVE PERCENT: 62.1 %
PLATELET COUNT: 257 10*9/L (ref 150–450)
RED BLOOD CELL COUNT: 4.75 10*12/L (ref 3.95–5.13)
RED CELL DISTRIBUTION WIDTH: 14.4 % (ref 12.2–15.2)
WBC ADJUSTED: 6.2 10*9/L (ref 3.6–11.2)

## 2023-08-15 LAB — URINALYSIS WITH MICROSCOPY
BILIRUBIN UA: NEGATIVE
BLOOD UA: NEGATIVE
GLUCOSE UA: NEGATIVE
KETONES UA: NEGATIVE
NITRITE UA: NEGATIVE
PH UA: 6 (ref 5.0–9.0)
PROTEIN UA: 50 — AB
RBC UA: 4 /HPF (ref <=4)
SPECIFIC GRAVITY UA: 1.029 (ref 1.003–1.030)
SQUAMOUS EPITHELIAL: 6 /HPF — ABNORMAL HIGH (ref 0–5)
TRANSITIONAL EPITHELIAL: <1 /HPF (ref 0–2)
UROBILINOGEN UA: <2
WBC UA: 6 /HPF — ABNORMAL HIGH (ref 0–5)

## 2023-08-15 LAB — GAMMA GT: GAMMA GLUTAMYL TRANSFERASE: 36 U/L

## 2023-08-15 LAB — IRON & TIBC
IRON SATURATION: 24 % (ref 20–55)
IRON: 63 ug/dL
TOTAL IRON BINDING CAPACITY: 259 ug/dL (ref 250–425)

## 2023-08-15 LAB — PROTEIN / CREATININE RATIO, URINE
CREATININE, URINE: 197.9 mg/dL
PROTEIN URINE: 52 mg/dL
PROTEIN/CREAT RATIO, URINE: 0.263

## 2023-08-15 LAB — ALBUMIN / CREATININE URINE RATIO
ALBUMIN QUANT URINE: 22.1 mg/dL
ALBUMIN/CREATININE RATIO: 111.7 ug/mg — ABNORMAL HIGH (ref 0.0–30.0)
CREATININE, URINE: 197.9 mg/dL

## 2023-08-15 LAB — COMPREHENSIVE METABOLIC PANEL
ALBUMIN: 3.8 g/dL (ref 3.4–5.0)
ALKALINE PHOSPHATASE: 72 U/L (ref 46–116)
ALT (SGPT): 19 U/L (ref 10–49)
ANION GAP: 6 mmol/L (ref 5–14)
AST (SGOT): 18 U/L (ref <=34)
BILIRUBIN TOTAL: 0.5 mg/dL (ref 0.3–1.2)
BLOOD UREA NITROGEN: 11 mg/dL (ref 9–23)
BUN / CREAT RATIO: 19
CALCIUM: 9.7 mg/dL (ref 8.7–10.4)
CHLORIDE: 108 mmol/L — ABNORMAL HIGH (ref 98–107)
CO2: 26.5 mmol/L (ref 20.0–31.0)
CREATININE: 0.59 mg/dL
EGFR CKD-EPI (2021) FEMALE: >90 mL/min/{1.73_m2} (ref >=60)
GLUCOSE RANDOM: 107 mg/dL — ABNORMAL HIGH (ref 70–99)
POTASSIUM: 3.8 mmol/L (ref 3.4–4.8)
PROTEIN TOTAL: 6.6 g/dL (ref 5.7–8.2)
SODIUM: 140 mmol/L (ref 135–145)

## 2023-08-15 LAB — PHOSPHORUS: PHOSPHORUS: 3.1 mg/dL (ref 2.4–5.1)

## 2023-08-15 LAB — MAGNESIUM: MAGNESIUM: 1.5 mg/dL — ABNORMAL LOW (ref 1.6–2.6)

## 2023-08-15 LAB — FERRITIN: FERRITIN: 111.1 ng/mL

## 2023-08-15 NOTE — Unmapped (Signed)
Transplant Coordinator, Clinic Visit   Pt seen today by transplant nephrology for follow up, reviewed medications and symptoms.          08/15/23 1026   BP: 113/83   Pulse: 63   Temp: 35.9 ??C (96.6 ??F)   Weight: 82.4 kg (181 lb 9.6 oz)   PainSc: 7    PainLoc: Knee       Assessment  BP @ Home: not checking  BG: N/A  HA/Dizziness/Lightheaded: No  Hand tremors: No  Numbness/tingling: No  Fevers/Chills/sweats: No  Chest Pain/SOB: No  N/V/Heartburn: No  Diarrhea/constipation: No  UTI symptoms: No  Swelling: No  Pain: 0  Incision/Drain/Foley: N/A    Good appetite; reports adequate hydration.    Any new medications? No  Immunosuppressant last taken: 9AM yesterday    Immunization status: Covid today - UTD flu, pna, shingles    Issues affording medications - Cecilie Lowers, CPP will look into things to be helpful. Pt has enough meds for this week. Only meds with cost: Envarsus and Myfortic. Will reach out to [facility] for additional help/information.     Ear surgery for Moh's scheduled  Hip surgery - ok to proceed per Dr. Margaretmary Bayley    Due for Ritux 07/2023 - will schedule for Oct per Dr. Margaretmary Bayley, UPC stable.     Annual imaging done today

## 2023-08-15 NOTE — Unmapped (Signed)
Transplant Nephrology Clinic Visit    Assessment and Plan  Michelle Travis is a 67 y.o. 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 She has recurrent PLA2R positive membranous nephropathy and is seen for follow up of her transplant and associated medical problems.     # Status post kidney transplant   # Membranous nephropathy recurrence.  - Kidney biopsy 03/11/21 with PLA2R positive membranous nephropathy  - Initial treatment included Rituximab 1 gm on 03/23/21 and 1 gm on 04/07/21 and 125 mg solumedrol x 2 given with the doses of Rituximab.   - Recurrence of nephrotic range proteinuria treated with Rituximab 1 gm 02/07/22, 03/07/22, 07/20/22, 01/19/23.   - UPC peaked at 5.02 on 03/23/21 improved to 0.623 on 07/20/21 then peaked again to 8/66 on 02/07/22. With maintenance dosing rituximab UPC today is down to only to return to nephrotic range. UPC today is down to 0.263.      - Serum creatinine is stable at 0.59 mg/dL (baseline < 0.8 mg/dL)  - Will plan every 6 months rituximab. She is presently due a repeat dose.   - Patient at increased risk of DVT in view of membranous nephropathy recurrence. With normalization of UPC and serum albumin this risk is now much reduced. Will continue aspirin.     # S/P Liver transplant    - LFTs normal today  - She will continue to follow with the liver transplant team.     # Immunosuppression Management   - Tacrolimus level is 4.3 (target 3-6)  - Continue Envarsus 2.5 mg daily   - Continue Myfortic 360 mg BID  - Continue rituximab every 6 months    # GI bleed, 09/2022, diverticular origin  # Iron Deficiency anemia, resolved  - Hospitalized 10/25/22-10/27/22 with rectal bleeding. Colonoscopy 10/26/22 with sigmoid, hepatic flexure, and ascending colon diverticulosis. Repeat colonoscopy 01/24/23 with similar results  - Denies recurrence of bleeding   - s/p feraheme 02/16/23, 02/28/23 and 03/12/23  - Iron saturation 24% today   - Hemoglobin 13.7 today    # BP Management  - BP 107//72  - Currently on no antihypertensive therapy.    # Osteoartritis, knees and hips  # S/P right total hip replacement, 11/2021  - I have previously suggested planned possible TKR be delayed until the response to membranous recurrence treatment can be fully evaluated. This is predominantly related to concern about post-op DVT.   - With improvement in proteinuria and serum albumin level she can once again engage her ortho team regarding timing for possible knee or hip surgery.     History of IgG lambda monoclonal gammopathy.   - SPEP with immunofixation 03/19/20 and 01/12/23 revealed no monoclonal protein. Kidney biopsy revealed no evidence of MGRS.    History of sleep apnea.   - She is no longer on CPAP.  - She denies daytime drowsiness.     Immunizations.  -  Prevnar-20 07/20/21.  - Flu vaccine 07/24/23  - Shingrix x 2 completed  - COVID-19  booster 08/15/23  - RSV vaccine  01/02/23  - Shingrix x 2 completed    Dysplastic nevus right ear  - Right ear helical rim with dysplastic nevus with moderate to severe atypia with positive deep margin on prior excisional pathology  - Etter Sjogren, MD is planning further surgery on the ear    Cancer screening.   - Mammography 06/01/23 negative, has remote history of breast cancer.  - PAP smear 11/20/22 negative for malignancy.  -  Colonoscopy 10/26/22 and 01/24/23 with diverticulosis.    Follow-up.   Transplant nephrology clinic 3 months  Monthly labs including UPC  Rituximab 1 gm now due and then every 6 months    History of Present Illness  Michelle Travis is a 67 y.o. 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  She was diagnosed with recurrent membranous nephropathy on  03/11/21 and was treated with 2 doses of Rituximab 1 gm on 03/23/21 and 04/07/21 with improvement in proteinuria. UPC increased to 6.94 on 01/27/22 and she has been treated with 1 gm rituximab doses on 02/07/22, 03/07/22, 07/20/22, and 01/19/23 again with improvement in proteinuria.     She has noted no recurrence of GI bleeding since 09/2022. She denies dysuria, allograft tenderness, chest pain, shortness of breath, edema, hemoptysis, calf tenderness, headaches, or hand tremors. She has not been checking home BP readings.     She has left hip and bilateral knee pain. Hip surgery has been postponed until membranous nephropathy is in a lower risk phase or in remission.     Immunosuppression is presently Envarsus 2.5mg  daily and Myfortic 360 mg bid.      Last dose of Envarsus: 9 AM yesterday.    Transplant History:  1. 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 S/P kidney and liver transplants on 09/13/17 at [facility]. Kidney KDPI 26%. CMV D+/R+; EBV D-/R+, hep C Ab +/NAT- (recipient hep C Ab negative)  4. Complicated by rectus sheath hematoma  5. Delayed graft function requiring hemodialysis sessions following transplant (stopped dialysis 11/26/018).  6. Immunosuppression was ??? induction followed by tacrolimus and myfortic maintenance therapy.   7. Baseline creatinine <1.0 mg/dL.  8. Kidney biopsy 03/11/21 with recurrent PLA2R positive membranous nephropathy, treated with Rituximab 1 gm x 4 (03/23/21, 04/07/21, 02/07/22, 03/07/22)   9. Rotavirus infection 04/2022 with AKI secondary to volume depletion    Past Medical History:  1. Alcoholic cirrhosis, S/P TIPS  2. Membranous nephropathy, PLA2R positive biopsy 08/30/2016, treated solumedrol initially with little improvement then with Rituximab (10/2016 and 03/2017).   3. S/P kidney and liver transplants as stated above  4. History of breast cancer, in remission for 17 years, s/p lumpectomy.  5. History of IGG monoclonal gammopathy, IgG lambda. Kidney biopsy 08/30/2016 without MGRS.  6. S/p cerebral aneurysm repair.  7. S/P total knee replacement, right   8. Essential HTN prior to ESLD  9. History of diverticulitis  10. Recurrent pleural effusion, history of pneumonia x 3  11. Osteopenia by DEXA 06/14/2016  12. Hypoattenuating thyroid nodule noted on chest CT 11/11/2016  13. Sleep apnea    Review of Systems    All other systems are reviewed and are negative.    Medications    Current Outpatient Medications   Medication Sig Dispense Refill    acetaminophen (TYLENOL 8 HOUR) 650 MG CR tablet Take 1 tablet (650 mg total) by mouth every four (4) hours as needed for pain. 30 tablet 0    atorvastatin (LIPITOR) 10 MG tablet Take 1 tablet (10 mg total) by mouth daily. 90 tablet 3    buprenorphine (BELBUCA) 150 mcg buccal film Apply 1 Film to cheek at bedtime.      cholecalciferol, vitamin D3 25 mcg, 1,000 units,, 1,000 unit (25 mcg) tablet Take 1 tablet (25 mcg total) by mouth in the  morning. 90 tablet 3    ENVARSUS XR 1 mg Tb24 extended release tablet Take 1 tablet (1 mg total) by mouth in the morning. Take with TWO 0.75 mg tablets (1.5 mg total) by mouth in the morning for a total of 2.5 mg. 90 tablet 3    mycophenolate (MYFORTIC) 180 MG EC tablet Take 2 tablets (360 mg total) by mouth two (2) times a day. 360 tablet 3    oxyCODONE (ROXICODONE) 10 mg immediate release tablet TAKE 1 (ONE) TABLET BY MOUTH FOUR TIMES DAILY, AS NEEDED      pantoprazole (PROTONIX) 40 MG tablet Take 1 tablet (40 mg total) by mouth daily. 90 tablet 3    tacrolimus (ENVARSUS XR) 0.75 mg Tb24 extended release tablet Take 2 tablets (1.5 mg total) by mouth in the morning. Take with ONE 1 mg tablet by mouth in the morning for a total of 2.5 mg. 180 tablet 3     No current facility-administered medications for this visit.       Physical Exam    BP 113/83 (BP Site: R Arm, BP Position: Sitting, BP Cuff Size: Medium)  - Pulse 63  - Temp 35.9 ??C (96.6 ??F) (Temporal)  - Wt 82.4 kg (181 lb 9.6 oz)  - LMP  (LMP Unknown)  - BMI 31.39 kg/m??   General: Patient is a pleasant female in no apparent distress.  Eyes: Sclera anicteric.  Neck: Supple without LAD/JVD/bruits.  Lungs: Clear to auscultation bilaterally, no wheezes/rales/rhonchi.  Cardiovascular: Regular rate and rhythm without murmurs, rubs or gallops.  Abdomen: Soft, notender/nondistended. Positive bowel sounds. No hepatosplenomegaly, masses or bruits appreciated.   Extremities: no edema  Skin: Without rash  Neurological: Grossly nonfocal.  Psychiatric: Mood and affect appropriate.    Laboratory Results and Imaging Reviewed

## 2023-08-16 LAB — TACROLIMUS LEVEL, TROUGH: TACROLIMUS, TROUGH: 4.3 ng/mL — ABNORMAL LOW (ref 5.0–15.0)

## 2023-08-16 NOTE — Unmapped (Signed)
Lab results from 08/15/23 were reviewed by Dr. Margaretmary Bayley as seen in Epic. LFTs and tac stable. No changes recommended at this time.

## 2023-08-18 DIAGNOSIS — E785 Hyperlipidemia, unspecified: Secondary | ICD-10-CM | POA: Diagnosis not present

## 2023-08-18 DIAGNOSIS — K219 Gastro-esophageal reflux disease without esophagitis: Secondary | ICD-10-CM | POA: Diagnosis not present

## 2023-08-20 DIAGNOSIS — D849 Immunodeficiency, unspecified: Principal | ICD-10-CM

## 2023-08-20 DIAGNOSIS — Z940 Kidney transplant status: Principal | ICD-10-CM

## 2023-08-20 MED FILL — ATORVASTATIN 10 MG TABLET: ORAL | 90 days supply | Qty: 90 | Fill #1

## 2023-08-20 MED FILL — MYFORTIC 180 MG TABLET,DELAYED RELEASE: ORAL | 30 days supply | Qty: 120 | Fill #1

## 2023-08-20 NOTE — Unmapped (Signed)
Michelle Travis 's Myfortic shipment will be rescheduled.     I have spoken with the patient  at [phone]  and communicated the delivery change. We will reschedule the medication for the delivery date that the patient agreed upon.  We have confirmed the delivery date as 08/21/2023, via ups.     Envarsus is still being held due to coverage issues and issues with free trial card. [employer] has been reached and told us they'd call back with resolution. No response yet received.

## 2023-08-21 DIAGNOSIS — Z944 Liver transplant status: Principal | ICD-10-CM

## 2023-08-21 DIAGNOSIS — Z940 Kidney transplant status: Principal | ICD-10-CM

## 2023-08-21 DIAGNOSIS — Z796 Long-term use of immunosuppressant medication: Principal | ICD-10-CM

## 2023-08-21 MED ORDER — TACROLIMUS XR 1 MG TABLET,EXTENDED RELEASE 24 HR
ORAL_TABLET | Freq: Every day | ORAL | 0 refills | 30 days | Status: CP
Start: 2023-08-21 — End: ?

## 2023-08-21 MED ORDER — TACROLIMUS XR 0.75 MG TABLET,EXTENDED RELEASE 24 HR
ORAL_TABLET | Freq: Every day | ORAL | 0 refills | 30 days | Status: CP
Start: 2023-08-21 — End: ?

## 2023-08-21 NOTE — Unmapped (Signed)
Michelle Travis 's Envarsus shipment will be canceled as a result of a high copay.     Patient was approved for a bridge through [employer] and is receiving the medication from [facility] for now. She is aware.

## 2023-08-21 NOTE — Unmapped (Signed)
Pt unable to afford this month's supply of Envarsus.  [employer] bridge program can send 1 free month of Envarsus.  Rxs sent to [facility] pharmacy for supply.

## 2023-08-27 DIAGNOSIS — Z944 Liver transplant status: Principal | ICD-10-CM

## 2023-08-27 DIAGNOSIS — Z796 Long-term use of immunosuppressant medication: Principal | ICD-10-CM

## 2023-08-30 ENCOUNTER — Ambulatory Visit: Admit: 2023-08-30 | Discharge: 2023-08-31 | Payer: MEDICARE

## 2023-08-30 DIAGNOSIS — N048 Nephrotic syndrome with other morphologic changes: Secondary | ICD-10-CM | POA: Diagnosis not present

## 2023-08-30 DIAGNOSIS — N042 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, unspecified: Secondary | ICD-10-CM | POA: Diagnosis not present

## 2023-08-30 DIAGNOSIS — R8279 Other abnormal findings on microbiological examination of urine: Secondary | ICD-10-CM | POA: Diagnosis not present

## 2023-08-30 DIAGNOSIS — N022 Recurrent and persistent hematuria with diffuse membranous glomerulonephritis: Secondary | ICD-10-CM | POA: Diagnosis not present

## 2023-08-30 DIAGNOSIS — R799 Abnormal finding of blood chemistry, unspecified: Secondary | ICD-10-CM | POA: Diagnosis not present

## 2023-08-30 DIAGNOSIS — Z940 Kidney transplant status: Secondary | ICD-10-CM | POA: Diagnosis not present

## 2023-08-30 LAB — CBC W/ AUTO DIFF
BASOPHILS ABSOLUTE COUNT: 0 10*9/L (ref 0.0–0.1)
BASOPHILS RELATIVE PERCENT: 0.9 %
EOSINOPHILS ABSOLUTE COUNT: 0.2 10*9/L (ref 0.0–0.5)
EOSINOPHILS RELATIVE PERCENT: 4 %
HEMATOCRIT: 40.5 % (ref 34.0–44.0)
HEMOGLOBIN: 13.6 g/dL (ref 11.3–14.9)
LYMPHOCYTES ABSOLUTE COUNT: 1.4 10*9/L (ref 1.1–3.6)
LYMPHOCYTES RELATIVE PERCENT: 34 %
MEAN CORPUSCULAR HEMOGLOBIN CONC: 33.5 g/dL (ref 32.0–36.0)
MEAN CORPUSCULAR HEMOGLOBIN: 29.3 pg (ref 25.9–32.4)
MEAN CORPUSCULAR VOLUME: 87.4 fL (ref 77.6–95.7)
MEAN PLATELET VOLUME: 9.3 fL (ref 6.8–10.7)
MONOCYTES ABSOLUTE COUNT: 0.5 10*9/L (ref 0.3–0.8)
MONOCYTES RELATIVE PERCENT: 11 %
NEUTROPHILS ABSOLUTE COUNT: 2.1 10*9/L (ref 1.8–7.8)
NEUTROPHILS RELATIVE PERCENT: 50.1 %
PLATELET COUNT: 291 10*9/L (ref 150–450)
RED BLOOD CELL COUNT: 4.63 10*12/L (ref 3.95–5.13)
RED CELL DISTRIBUTION WIDTH: 14 % (ref 12.2–15.2)
WBC ADJUSTED: 4.2 10*9/L (ref 3.6–11.2)

## 2023-08-30 LAB — URINALYSIS WITH MICROSCOPY
BILIRUBIN UA: NEGATIVE
BLOOD UA: NEGATIVE
GLUCOSE UA: NEGATIVE
KETONES UA: NEGATIVE
NITRITE UA: NEGATIVE
PH UA: 5 (ref 5.0–9.0)
PROTEIN UA: NEGATIVE
RBC UA: 2 /HPF (ref <=4)
SPECIFIC GRAVITY UA: 1.011 (ref 1.003–1.030)
SQUAMOUS EPITHELIAL: 3 /HPF (ref 0–5)
UROBILINOGEN UA: <2
WBC UA: 6 /HPF — ABNORMAL HIGH (ref 0–5)

## 2023-08-30 LAB — IRON & TIBC: TOTAL IRON BINDING CAPACITY: 258 ug/dL (ref 250–425)

## 2023-08-30 LAB — COMPREHENSIVE METABOLIC PANEL
ALBUMIN: 3.9 g/dL (ref 3.4–5.0)
ALKALINE PHOSPHATASE: 51 U/L (ref 46–116)
ANION GAP: 4 mmol/L — ABNORMAL LOW (ref 5–14)
BILIRUBIN TOTAL: 0.6 mg/dL (ref 0.3–1.2)
BLOOD UREA NITROGEN: 17 mg/dL (ref 9–23)
BUN / CREAT RATIO: 34
CALCIUM: 9 mg/dL (ref 8.7–10.4)
CHLORIDE: 107 mmol/L (ref 98–107)
CO2: 28 mmol/L (ref 20.0–31.0)
CREATININE: 0.5 mg/dL — ABNORMAL LOW
EGFR CKD-EPI (2021) FEMALE: >90 mL/min/{1.73_m2} (ref >=60)
GLUCOSE RANDOM: 101 mg/dL — ABNORMAL HIGH (ref 70–99)
PROTEIN TOTAL: 7.1 g/dL (ref 5.7–8.2)
SODIUM: 139 mmol/L (ref 135–145)

## 2023-08-30 LAB — ALBUMIN / CREATININE URINE RATIO
ALBUMIN QUANT URINE: 4.2 mg/dL
ALBUMIN/CREATININE RATIO: 79.2 ug/mg — ABNORMAL HIGH (ref 0.0–30.0)
CREATININE, URINE: 53 mg/dL

## 2023-08-30 LAB — PHOSPHORUS: PHOSPHORUS: 3.2 mg/dL (ref 2.4–5.1)

## 2023-08-30 LAB — FERRITIN: FERRITIN: 111 ng/mL

## 2023-08-30 LAB — PROTEIN / CREATININE RATIO, URINE
CREATININE, URINE: 53.7 mg/dL
PROTEIN URINE: 6.3 mg/dL
PROTEIN/CREAT RATIO, URINE: 0.117

## 2023-08-30 LAB — TACROLIMUS LEVEL, TROUGH: TACROLIMUS, TROUGH: 4 ng/mL — ABNORMAL LOW (ref 5.0–15.0)

## 2023-08-30 LAB — MAGNESIUM: MAGNESIUM: 1.6 mg/dL (ref 1.6–2.6)

## 2023-08-30 MED ADMIN — riTUXimab-abbs (TRUXIMA) 1,000 mg in sodium chloride (NS) 0.9 % 640 mL IVPB: 1000 mg | INTRAVENOUS | @ 14:00:00 | Stop: 2023-08-30

## 2023-08-30 MED ADMIN — diphenhydrAMINE (BENADRYL) injection 25 mg: 25 mg | INTRAVENOUS | @ 13:00:00 | Stop: 2023-08-30

## 2023-08-30 MED ADMIN — sodium chloride (NS) 0.9 % infusion: 20 mL/h | INTRAVENOUS | @ 13:00:00 | Stop: 2023-08-30

## 2023-08-30 MED ADMIN — acetaminophen (TYLENOL) tablet 650 mg: 650 mg | ORAL | @ 13:00:00 | Stop: 2023-08-30

## 2023-08-30 MED ADMIN — methylPREDNISolone sodium succinate (PF) (SOLU-Medrol) injection 125 mg: 125 mg | INTRAVENOUS | @ 13:00:00 | Stop: 2023-08-30

## 2023-08-30 NOTE — Unmapped (Signed)
1610 Patient arrived to the Transplant Infusion room for Rituximab 1000 mg infusion. Condition: well, Mobility: ambulating, accompanied by relative.   See Flowsheet and MAR for all details of visit.  0831 VS stable.   9604 PIV placed and secured with coban, labs collected and sent; urine collected and sent.  5409 Premedications given.  Today's lab results: WBC 4.2 (must be >2.0 or hold infusion and notify provider) and Platelets 291 (must be > 50 or hold infusion and notify provider).   8119 Rituximab infusion initiated rituximab: rituximab subsequent infusion protocol to start 100 mg/hr, rate increase by 100 mg/hr every 30 minutes as tolerated till maximum rate of 400 mg/hr. Patient educated to notify nursing staff if they experience urticaria, erythema, nausea, shortness of breath, metallic taste in mouth, excessive oral or postnasal drainage and any other changes in status which could be side effects from initiating this medication.  1314 Rituximab infusion completed, pt tolerated infusion well.  1320 VS stable, PIV removed and site secured with coban. Patient left clinic today, condition: well; mobility: ambulating; accompanied by relative.

## 2023-09-02 DIAGNOSIS — N048 Nephrotic syndrome with other morphologic changes: Principal | ICD-10-CM

## 2023-09-02 DIAGNOSIS — N022 Recurrent and persistent hematuria with diffuse membranous glomerulonephritis: Principal | ICD-10-CM

## 2023-09-02 DIAGNOSIS — N042 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: Principal | ICD-10-CM

## 2023-09-07 DIAGNOSIS — M25562 Pain in left knee: Secondary | ICD-10-CM | POA: Diagnosis not present

## 2023-09-07 DIAGNOSIS — Z79899 Other long term (current) drug therapy: Secondary | ICD-10-CM | POA: Diagnosis not present

## 2023-09-07 DIAGNOSIS — M545 Low back pain, unspecified: Secondary | ICD-10-CM | POA: Diagnosis not present

## 2023-09-07 DIAGNOSIS — C50919 Malignant neoplasm of unspecified site of unspecified female breast: Secondary | ICD-10-CM | POA: Diagnosis not present

## 2023-09-13 DIAGNOSIS — Z79899 Other long term (current) drug therapy: Secondary | ICD-10-CM | POA: Diagnosis not present

## 2023-09-17 DIAGNOSIS — D849 Immunodeficiency, unspecified: Principal | ICD-10-CM

## 2023-09-17 DIAGNOSIS — Z940 Kidney transplant status: Principal | ICD-10-CM

## 2023-09-17 NOTE — Unmapped (Addendum)
11/20: patient is aware that starting 09/30/23 [facility] will need credit card on file for all meds with copays/pricing  -ef    11/20: pt ok'ed copay of $17.56 on generic mycophenolate instead of brand myfortic. Generic will be dispensed in below work order -ef    11/20: patient did not ok high envarsus copays, she found 3-4 weeks supply on hand and will followup with maps (per referrals emails sent to her) to get mfg assistance in place before she runs out -ef    11/20: patient is unable to make payment on AR account. Asked for delivery to be reset up for fill on 11/25. She said she will call us before 11/25 to make payment. She verified has enough of ALL medications to last until mid next week -[facility] Clinical Assessment & Refill Coordination Note    Michelle Travis, DOB: 05-10-1956  Phone: [phone] (home)     All above HIPAA information was verified with patient.     Was a Nurse, learning disability used for this call? No    Specialty Medication(s):   Transplant: Envarsus 1mg , Envarsus 0.75mg , and Myfortic 180mg      Current Outpatient Medications   Medication Sig Dispense Refill    acetaminophen (TYLENOL 8 HOUR) 650 MG CR tablet Take 1 tablet (650 mg total) by mouth every four (4) hours as needed for pain. 30 tablet 0    atorvastatin (LIPITOR) 10 MG tablet Take 1 tablet (10 mg total) by mouth daily. 90 tablet 3    buprenorphine (BELBUCA) 150 mcg buccal film Apply 1 Film to cheek at bedtime.      cholecalciferol, vitamin D3 25 mcg, 1,000 units,, 1,000 unit (25 mcg) tablet Take 1 tablet (25 mcg total) by mouth in the morning. 90 tablet 3    ENVARSUS XR 1 mg Tb24 extended release tablet Take 1 tablet (1 mg total) by mouth in the morning. Take with TWO 0.75 mg tablets (1.5 mg total) by mouth in the morning for a total of 2.5 mg. 90 tablet 3    mycophenolate (MYFORTIC) 180 MG EC tablet Take 2 tablets (360 mg total) by mouth two (2) times a day. 360 tablet 3    oxyCODONE (ROXICODONE) 10 mg immediate release tablet TAKE 1 (ONE) TABLET BY MOUTH FOUR TIMES DAILY, AS NEEDED      pantoprazole (PROTONIX) 40 MG tablet Take 1 tablet (40 mg total) by mouth daily. 90 tablet 3    tacrolimus (ENVARSUS XR) 0.75 mg Tb24 extended release tablet Take 2 tablets (1.5 mg total) by mouth in the morning. Take with ONE 1 mg tablet by mouth in the morning for a total of 2.5 mg. 180 tablet 3    tacrolimus (ENVARSUS XR) 0.75 mg Tb24 extended release tablet Take 2 tablets (1.5 mg total) by mouth in the morning. Take in addition to 1 mg tablet for total daily dose of 2.5 mg once daily. 60 tablet 0    tacrolimus (ENVARSUS XR) 1 mg Tb24 extended release tablet Take 1 tablet (1 mg total) by mouth in the morning. Take in addition to 0.75 mg tablets for total daily dose of 2.5 mg daily. 30 tablet 0     No current facility-administered medications for this visit.        Changes to medications: Michelle Travis no changes at this time.    Allergies   Allergen Reactions    Latex Swelling and Rash  Discoloration of skin.   Discoloration of skin.     Hydrocodone Itching    Hydrocodone-Acetaminophen Itching       Changes to allergies: No    SPECIALTY MEDICATION ADHERENCE     Envarsus 1mg   : 5 days of medicine on hand   Envarsus 0.75mg   : 5 days of medicine on hand   Myfortic 180mg   : 5 days of medicine on hand       Medication Adherence    Patient reported X missed doses in the last month: 0  Specialty Medication: envarsus 1mg   Patient is on additional specialty medications: Yes  Additional Specialty Medications: Envarsus 0.75mg   Patient Reported Additional Medication X Missed Doses in the Last Month: 0  Patient is on more than two specialty medications: Yes  Specialty Medication: myfortic 180mg   Patient Reported Additional Medication X Missed Doses in the Last Month: 0  Adherence tools used: patient uses a pill box to manage medications          Specialty medication(s) dose(s) confirmed: Regimen is correct and unchanged.     Are there any concerns with adherence? No    Adherence counseling provided? Not needed    CLINICAL MANAGEMENT AND INTERVENTION      Clinical Benefit Assessment:    Do you feel the medicine is effective or helping your condition? Yes    Clinical Benefit counseling provided? Not needed    Adverse Effects Assessment:    Are you experiencing any side effects? No    Are you experiencing difficulty administering your medicine? No    Quality of Life Assessment:    Quality of Life    Rheumatology  Oncology  Dermatology  Cystic Fibrosis          How many days over the past month did your transplant  keep you from your normal activities? For example, brushing your teeth or getting up in the morning. 0    Have you discussed this with your provider? Not needed    Acute Infection Status:    Acute infections noted within Epic:  No active infections  Patient reported infection: None    Therapy Appropriateness:    Is therapy appropriate based on current medication list, adverse reactions, adherence, clinical benefit and progress toward achieving therapeutic goals? Yes, therapy is appropriate and should be continued     DISEASE/MEDICATION-SPECIFIC INFORMATION      N/A    Solid Organ Transplant: Not Applicable    PATIENT SPECIFIC NEEDS     Does the patient have any physical, cognitive, or cultural barriers? No    Is the patient high risk? Yes, patient is taking a REMS drug. Medication is dispensed in compliance with REMS program    Did the patient require a clinical intervention? No    Does the patient require physician intervention or other additional services (i.e., nutrition, smoking cessation, social work)? No    SOCIAL DETERMINANTS OF HEALTH     At the [facility], we have learned that life circumstances - like trouble affording food, housing, utilities, or transportation can affect the health of many of our patients.   That is why we wanted to ask: are you currently experiencing any life circumstances that are negatively impacting your health and/or quality of life? Patient declined to answer    Social Drivers of Health     Food Insecurity: No Food Insecurity (11/20/2022)    Received from [facility], [facility]    Hunger Vital Sign  Worried About Programme researcher, broadcasting/film/video in the Last Year: Never true     Ran Out of Food in the Last Year: Never true   Internet Connectivity: No Internet connectivity concern identified (10/25/2022)    Internet Connectivity     Do you have access to internet services: Yes     How do you connect to the internet: Personal Device at home     Is your internet connection strong enough for you to watch video on your device without major problems?: Yes     Do you have enough data to get through the month?: Yes     Does at least one of the devices have a camera that you can use for video chat?: Yes   Housing/Utilities: Low Risk  (10/25/2022)    Housing/Utilities     Within the past 12 months, have you ever stayed: outside, in a car, in a tent, in an overnight shelter, or temporarily in someone else's home (i.e. couch-surfing)?: No     Are you worried about losing your housing?: No     Within the past 12 months, have you been unable to get utilities (heat, electricity) when it was really needed?: No   Tobacco Use: Low Risk  (08/15/2023)    Patient History     Smoking Tobacco Use: Never     Smokeless Tobacco Use: Never     Passive Exposure: Never   Transportation Needs: No Transportation Needs (11/20/2022)    Received from [facility], [facility]    [facility] - Transportation     Lack of Transportation (Medical): No     Lack of Transportation (Non-Medical): No   Alcohol Use: Not At Risk (10/25/2022)    Alcohol Use     How often do you have a drink containing alcohol?: Never     How many drinks containing alcohol do you have on a typical day when you are drinking?: Not on file     How often do you have 5 or more drinks on one occasion?: Never   Interpersonal Safety: Not on file   Physical Activity: Sufficiently Active (11/20/2022)    Received from [facility], [facility]    Exercise Vital Sign     Days of Exercise per Week: 7 days     Minutes of Exercise per Session: 70 min   Intimate Partner Violence: Not At Risk (11/20/2022)    Received from [facility], [facility]    Humiliation, Afraid, Rape, and Kick questionnaire     Fear of Current or Ex-Partner: No     Emotionally Abused: No     Physically Abused: No     Sexually Abused: No   Stress: No Stress Concern Present (11/20/2022)    Received from [facility], [facility]    [facility] of Occupational [facility] - Occupational Stress Questionnaire     Feeling of Stress : Only a little   Substance Use: Low Risk  (10/25/2022)    Substance Use     In the past year, how often have you used prescription drugs for non-medical reasons?: Never     In the past year, how often have you used illegal drugs?: Never     In the past year, have you used any substance for non-medical reasons?: No   Social Connections: Moderately Isolated (11/20/2022)    Received from [facility], [facility]    Social Connection and Isolation Panel [NHANES]     Frequency of Communication with Friends and  Family: More than three times a week     Frequency of Social Gatherings with Friends and Family: Once a week     Attends Religious Services: More than 4 times per year     Active Member of [employer] or Organizations: No     Attends Banker Meetings: Never     Marital Status: Widowed   Physicist, medical Strain: Low Risk  (11/20/2022)    Received from [facility], [facility]    Overall Financial Resource Strain (CARDIA)     Difficulty of Paying Living Expenses: Not hard at all   Depression: Not at risk (07/23/2023)    Received from [facility]    PHQ-2     Patient Health Questionnaire-2 Score: 0   Health Literacy: Not on file       Would you be willing to receive help with any of the needs that you have identified today? Not applicable       SHIPPING     Specialty Medication(s) to be Shipped: Transplant: Envarsus 1mg , Envarsus 0.75mg , and Myfortic 180mg     Other medication(s) to be shipped:  vitamin d     Changes to insurance: No    Delivery Scheduled: Yes, Expected medication delivery date: 09/20/2023.     Medication will be delivered via UPS to the confirmed prescription address in [facility].    The patient will receive a drug information handout for each medication shipped and additional FDA Medication Guides as required.  Verified that patient has previously received a Conservation officer, historic buildings and a Surveyor, mining.    The patient or caregiver noted above participated in the development of this care plan and knows that they can request review of or adjustments to the care plan at any time.      All of the patient's questions and concerns have been addressed.    [name], PharmD   [facility] Specialty Pharmacist

## 2023-09-18 DIAGNOSIS — K219 Gastro-esophageal reflux disease without esophagitis: Secondary | ICD-10-CM | POA: Diagnosis not present

## 2023-09-18 DIAGNOSIS — E785 Hyperlipidemia, unspecified: Secondary | ICD-10-CM | POA: Diagnosis not present

## 2023-09-18 DIAGNOSIS — Z944 Liver transplant status: Principal | ICD-10-CM

## 2023-09-18 DIAGNOSIS — Z940 Kidney transplant status: Principal | ICD-10-CM

## 2023-09-19 DIAGNOSIS — Z944 Liver transplant status: Principal | ICD-10-CM

## 2023-09-19 DIAGNOSIS — Z940 Kidney transplant status: Principal | ICD-10-CM

## 2023-09-19 NOTE — Unmapped (Signed)
TRF

## 2023-09-19 NOTE — Unmapped (Signed)
11/20: pt ok'ed generic mycophenolate pricing. She is aware of change from brand myfortic. Clinic aware -ef    11/20: pt is following up with maps team for envarsus assistance due to high copays. She has 3-4 weeks on hand. Clinic aware. -ef    11/20: pt will contact [facility] with AR account payment info by 11/25 when delivery is set up to be filled -[facility] Refill Coordination Note    Specialty Medication(s) to be Shipped:   Transplant:  mycophenolic acid 180mg     Other medication(s) to be shipped:  vitamin d  Patient declined ALL other refills at this time     Michelle Travis, DOB: 1956-05-02  Phone: [phone] (home)       All above HIPAA information was verified with patient.     Was a Nurse, learning disability used for this call? No    Completed refill call assessment today to schedule patient's medication shipment from the [facility]  [phone]).  All relevant notes have been reviewed.     Specialty medication(s) and dose(s) confirmed: Regimen is correct and unchanged.   Changes to medications: [name] reports no changes at this time.  Changes to insurance: No  New side effects reported not previously addressed with a pharmacist or physician: None reported  Questions for the pharmacist: No    Confirmed patient received a Conservation officer, historic buildings and a Surveyor, mining with first shipment. The patient will receive a drug information handout for each medication shipped and additional FDA Medication Guides as required.       DISEASE/MEDICATION-SPECIFIC INFORMATION        N/A    SPECIALTY MEDICATION ADHERENCE     Medication Adherence    Patient reported X missed doses in the last month: 0  Specialty Medication: envarsus 1mg   Patient is on additional specialty medications: Yes  Additional Specialty Medications: Envarsus 0.75mg   Patient Reported Additional Medication X Missed Doses in the Last Month: 0  Patient is on more than two specialty medications: Yes  Specialty Medication: mycohenolate (myfortic) 180mg   Patient Reported Additional Medication X Missed Doses in the Last Month: 0  Adherence tools used: patient uses a pill box to manage medications              Were doses missed due to medication being on hold? No    Envarsus 1mg   : 21 days of medicine on hand   Envarsus 0.75mg   : 21 days of medicine on hand   Mycophenolate (myfortic 180mg )  : 10 days of medicine on hand       REFERRAL TO PHARMACIST     Referral to the pharmacist: Not needed      [facility]     Shipping address confirmed in Epic.       Delivery Scheduled: Yes, Expected medication delivery date: 09/25/2023. Pending patient calling in to make AR account payment - she is aware this needs to be done.    Medication will be delivered via UPS to the prescription address in Epic [facility].    [name], PharmD   [facility]  Specialty Pharmacist

## 2023-09-24 DIAGNOSIS — D849 Immunodeficiency, unspecified: Secondary | ICD-10-CM | POA: Diagnosis not present

## 2023-09-24 DIAGNOSIS — Z7960 Long term (current) use of unspecified immunomodulators and immunosuppressants: Secondary | ICD-10-CM | POA: Diagnosis not present

## 2023-09-24 DIAGNOSIS — Z940 Kidney transplant status: Secondary | ICD-10-CM | POA: Diagnosis not present

## 2023-09-24 DIAGNOSIS — Z944 Liver transplant status: Secondary | ICD-10-CM | POA: Diagnosis not present

## 2023-09-24 NOTE — Unmapped (Signed)
Michelle Travis 's entire shipment will be delayed as a result of A/R account on hold, and pt has not made payment.     I have spoken with the patient  at [phone]  and communicated the delay. We will wait for a call back from the patient to reschedule the delivery.  We have not confirmed the new delivery date.

## 2023-09-25 DIAGNOSIS — Z940 Kidney transplant status: Principal | ICD-10-CM

## 2023-09-25 DIAGNOSIS — Z944 Liver transplant status: Principal | ICD-10-CM

## 2023-09-25 LAB — COMPREHENSIVE METABOLIC PANEL
ALBUMIN: 4.3 g/dL (ref 3.9–4.9)
ALKALINE PHOSPHATASE: 76 IU/L (ref 44–121)
ALT (SGPT): 15 IU/L (ref 0–32)
AST (SGOT): 16 IU/L (ref 0–40)
BILIRUBIN TOTAL (MG/DL) IN SER/PLAS: 0.3 mg/dL (ref 0.0–1.2)
BLOOD UREA NITROGEN: 13 mg/dL (ref 8–27)
BUN / CREAT RATIO: 20 (ref 12–28)
CALCIUM: 9.6 mg/dL (ref 8.7–10.3)
CHLORIDE: 105 mmol/L (ref 96–106)
CO2: 25 mmol/L (ref 20–29)
CREATININE: 0.64 mg/dL (ref 0.57–1.00)
EGFR: 97 mL/min/{1.73_m2}
GLOBULIN, TOTAL: 2.7 g/dL (ref 1.5–4.5)
GLUCOSE: 102 mg/dL — ABNORMAL HIGH (ref 70–99)
POTASSIUM: 5.2 mmol/L (ref 3.5–5.2)
SODIUM: 144 mmol/L (ref 134–144)
TOTAL PROTEIN: 7 g/dL (ref 6.0–8.5)

## 2023-09-25 LAB — CBC W/ DIFFERENTIAL
BANDED NEUTROPHILS ABSOLUTE COUNT: 0 10*3/uL (ref 0.0–0.1)
BASOPHILS ABSOLUTE COUNT: 0.1 10*3/uL (ref 0.0–0.2)
BASOPHILS RELATIVE PERCENT: 1 %
EOSINOPHILS ABSOLUTE COUNT: 0.1 10*3/uL (ref 0.0–0.4)
EOSINOPHILS RELATIVE PERCENT: 3 %
HEMATOCRIT: 47 % — ABNORMAL HIGH (ref 34.0–46.6)
HEMOGLOBIN: 14.8 g/dL (ref 11.1–15.9)
IMMATURE GRANULOCYTES: 0 %
LYMPHOCYTES ABSOLUTE COUNT: 1.2 10*3/uL (ref 0.7–3.1)
LYMPHOCYTES RELATIVE PERCENT: 24 %
MEAN CORPUSCULAR HEMOGLOBIN CONC: 31.5 g/dL (ref 31.5–35.7)
MEAN CORPUSCULAR HEMOGLOBIN: 28.6 pg (ref 26.6–33.0)
MEAN CORPUSCULAR VOLUME: 91 fL (ref 79–97)
MONOCYTES ABSOLUTE COUNT: 0.4 10*3/uL (ref 0.1–0.9)
MONOCYTES RELATIVE PERCENT: 8 %
NEUTROPHILS ABSOLUTE COUNT: 3.1 10*3/uL (ref 1.4–7.0)
NEUTROPHILS RELATIVE PERCENT: 64 %
PLATELET COUNT: 323 10*3/uL (ref 150–450)
RED BLOOD CELL COUNT: 5.17 x10E6/uL (ref 3.77–5.28)
RED CELL DISTRIBUTION WIDTH: 13 % (ref 11.7–15.4)
WHITE BLOOD CELL COUNT: 4.9 10*3/uL (ref 3.4–10.8)

## 2023-09-25 LAB — PHOSPHORUS: PHOSPHORUS, SERUM: 2.7 mg/dL — ABNORMAL LOW (ref 3.0–4.3)

## 2023-09-25 LAB — GAMMA GT: GAMMA GLUTAMYL TRANSFERASE: 33 IU/L (ref 0–60)

## 2023-09-25 LAB — BILIRUBIN, DIRECT: BILIRUBIN DIRECT: 0.12 mg/dL (ref 0.00–0.40)

## 2023-09-25 LAB — TACROLIMUS LEVEL: TACROLIMUS BLOOD: 5.3 ng/mL (ref 2.0–20.0)

## 2023-09-25 LAB — MAGNESIUM: MAGNESIUM: 1.5 mg/dL — ABNORMAL LOW (ref 1.6–2.3)

## 2023-09-26 DIAGNOSIS — Z940 Kidney transplant status: Principal | ICD-10-CM

## 2023-09-26 DIAGNOSIS — Z944 Liver transplant status: Principal | ICD-10-CM

## 2023-09-26 NOTE — Unmapped (Addendum)
Lab results from 09/24/23 were reviewed. No changes recommended at this time. Pt to repeat labs in one month.    Messaged Pt in MyChart as she is active.

## 2023-09-26 NOTE — Unmapped (Signed)
Received page from patient this afternoon when not at computer. Patient mentioned she started taking an iron tablet recently on her own and her stool was dark and wanted to know if iron could cause this. Mentioned iron can cause dark stool and also cause constipation. Patient has been having diarrhea.   While on the phone, patient mentioned she had a cold week before last and PCP prescribed abx for 7 days that she finished last week. Per patient, she does not feel any better and still has congestion and diarrhea started on Sunday. Verbalized understanding to call PCP today to let them know she was given an abx for respiratory virus that she finished last week, started having diarrhea on Sunday and still not feeling well; mentioned to patient they may need to check her stools in case abx caused this to rule out infection.   Patient mentioned she was going to just call her PCP next week after the holidays since she has to do all the cooking. Mentioned she needs to call the PCP today to be taken care of so that she can hopefully start feeling well enough for the holidays. Verbalized understanding. Discussed hydrating well and this note will be routed to her coordinator.   Patient mentioned she got her labs done yesterday at [facility]. Mentioned her coordinator may have reviewed these if they resulted today as some results have been delayed from yesterday with [facility]; aware if there are any concerns that her coordinator will reach out to her.   After talking to her, was able to see her labs in [facility] and her LFTs are fine, stable creatinine at 0.64; WBC normal.

## 2023-09-30 DIAGNOSIS — N022 Recurrent and persistent hematuria with diffuse membranous glomerulonephritis: Principal | ICD-10-CM

## 2023-09-30 DIAGNOSIS — N048 Nephrotic syndrome with other morphologic changes: Principal | ICD-10-CM

## 2023-09-30 DIAGNOSIS — Z940 Kidney transplant status: Principal | ICD-10-CM

## 2023-09-30 DIAGNOSIS — N042 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: Principal | ICD-10-CM

## 2023-10-08 DIAGNOSIS — Z79899 Other long term (current) drug therapy: Secondary | ICD-10-CM | POA: Diagnosis not present

## 2023-10-08 DIAGNOSIS — M545 Low back pain, unspecified: Secondary | ICD-10-CM | POA: Diagnosis not present

## 2023-10-08 DIAGNOSIS — C50919 Malignant neoplasm of unspecified site of unspecified female breast: Secondary | ICD-10-CM | POA: Diagnosis not present

## 2023-10-08 DIAGNOSIS — M25562 Pain in left knee: Secondary | ICD-10-CM | POA: Diagnosis not present

## 2023-10-08 DIAGNOSIS — R030 Elevated blood-pressure reading, without diagnosis of hypertension: Secondary | ICD-10-CM | POA: Diagnosis not present

## 2023-10-09 DIAGNOSIS — Z940 Kidney transplant status: Principal | ICD-10-CM

## 2023-10-09 DIAGNOSIS — Z944 Liver transplant status: Principal | ICD-10-CM

## 2023-10-09 MED ORDER — MYCOPHENOLATE MOFETIL 500 MG TABLET
ORAL_TABLET | Freq: Two times a day (BID) | ORAL | 11 refills | 30.00 days | Status: CP
Start: 2023-10-09 — End: 2024-10-08
  Filled 2023-11-26: qty 120, 30d supply, fill #2

## 2023-10-10 DIAGNOSIS — Z940 Kidney transplant status: Principal | ICD-10-CM

## 2023-10-11 DIAGNOSIS — Z79899 Other long term (current) drug therapy: Secondary | ICD-10-CM | POA: Diagnosis not present

## 2023-10-11 NOTE — Unmapped (Signed)
KIDNEY/LIVER POST-TRANSPLANT ASSESSMENT   Clinical Social Worker Telephone Note    Name:Michelle Travis  Date of Birth:06-21-1956  VWU:[id number]    REFERRAL INFORMATION:    DELIANA FROGGE is s/p transplant for [facility] transplant . CSW follows up to assess financial questions/planning.    PREFERRED LANGUAGE: English    INTERPRETER UTILIZED: N/A    TRANSPLANT DATE:   09/13/2017 (Liver), 09/13/2017 (Kidney)    POST TXP RN COORDINATOR:   De Blanch   [phone]; fax/([phone]) [phone]      SUMMARY:  Rec'd msg from TNC/LPriest  that pt had expressed difficulties with getting her medications refilled from [facility] 2/2 outstanding debt and new pharmacy rules on carrying balances.    Called pt and left VM requesting that she return my call.    Called Pharmacy Billing and confirmed that pt has a current outstanding balance of [phone], dating back to 02/2018.  Per records, there is no documentation of any payment being made on the account since 2019.    Rec'd call back from pt, who left a VM.  Ret'd call and left a 2nd return VM to call me back.    Will discuss her current situation at next opportunity.      Lowella Petties, LCSW, CCTSW  Transplant Case Manager  [facility]  10/10/2023

## 2023-10-12 DIAGNOSIS — I10 Essential (primary) hypertension: Secondary | ICD-10-CM | POA: Diagnosis not present

## 2023-10-12 DIAGNOSIS — M169 Osteoarthritis of hip, unspecified: Secondary | ICD-10-CM | POA: Diagnosis not present

## 2023-10-12 DIAGNOSIS — Z23 Encounter for immunization: Secondary | ICD-10-CM | POA: Diagnosis not present

## 2023-10-12 DIAGNOSIS — Z940 Kidney transplant status: Secondary | ICD-10-CM | POA: Diagnosis not present

## 2023-10-12 DIAGNOSIS — Z96652 Presence of left artificial knee joint: Secondary | ICD-10-CM | POA: Diagnosis not present

## 2023-10-12 DIAGNOSIS — K219 Gastro-esophageal reflux disease without esophagitis: Secondary | ICD-10-CM | POA: Diagnosis not present

## 2023-10-12 DIAGNOSIS — Z944 Liver transplant status: Secondary | ICD-10-CM | POA: Diagnosis not present

## 2023-10-12 DIAGNOSIS — Z1389 Encounter for screening for other disorder: Secondary | ICD-10-CM | POA: Diagnosis not present

## 2023-10-12 DIAGNOSIS — E785 Hyperlipidemia, unspecified: Secondary | ICD-10-CM | POA: Diagnosis not present

## 2023-10-12 DIAGNOSIS — Z0001 Encounter for general adult medical examination with abnormal findings: Secondary | ICD-10-CM | POA: Diagnosis not present

## 2023-10-13 NOTE — Unmapped (Signed)
Clinical Assessment Needed For: Formulation Change  Medication: Mycophenolate 500mg  tablet  Last Fill Date/Day Supply: N/A / N/A  Copay $5.67 for 30ds  Was previous dose already scheduled to fill: No    Notes to Pharmacist: Previously taking Myfortic. LF 08/15/2023 for 30ds

## 2023-10-15 DIAGNOSIS — D849 Immunodeficiency, unspecified: Principal | ICD-10-CM

## 2023-10-15 DIAGNOSIS — Z940 Kidney transplant status: Principal | ICD-10-CM

## 2023-10-15 NOTE — Unmapped (Signed)
Per Knox Saliva, CPP - Will plan to stay on generic Myfortic since cost is $0 for the patient. SSC and liver team aware.

## 2023-10-22 DIAGNOSIS — Z944 Liver transplant status: Principal | ICD-10-CM

## 2023-10-22 DIAGNOSIS — Z796 Long-term use of immunosuppressant medication: Principal | ICD-10-CM

## 2023-10-22 NOTE — Unmapped (Signed)
Michelle Travis has been contacted in regards to their refill of mycophenolate: MYFORTIC 180 mg EC tablet, tacrolimus: ENVARSUS XR 0.75 mg Tb24 extended release tablet and tacrolimus: ENVARSUS XR 1 mg Tb24 extended release tablet. At this time, they have declined refill due to  unable to pay copay . Refill assessment call date has been updated per the patient's request.

## 2023-10-23 DIAGNOSIS — Z940 Kidney transplant status: Secondary | ICD-10-CM | POA: Diagnosis not present

## 2023-10-23 DIAGNOSIS — D849 Immunodeficiency, unspecified: Secondary | ICD-10-CM | POA: Diagnosis not present

## 2023-10-23 LAB — CBC W/ DIFFERENTIAL
BANDED NEUTROPHILS ABSOLUTE COUNT: 0 10*3/uL (ref 0.0–0.1)
BASOPHILS ABSOLUTE COUNT: 0 10*3/uL (ref 0.0–0.2)
BASOPHILS RELATIVE PERCENT: 1 %
EOSINOPHILS ABSOLUTE COUNT: 0.2 10*3/uL (ref 0.0–0.4)
EOSINOPHILS RELATIVE PERCENT: 3 %
HEMATOCRIT: 40.8 % (ref 34.0–46.6)
HEMOGLOBIN: 13.4 g/dL (ref 11.1–15.9)
IMMATURE GRANULOCYTES: 0 %
LYMPHOCYTES ABSOLUTE COUNT: 1.5 10*3/uL (ref 0.7–3.1)
LYMPHOCYTES RELATIVE PERCENT: 25 %
MEAN CORPUSCULAR HEMOGLOBIN CONC: 32.8 g/dL (ref 31.5–35.7)
MEAN CORPUSCULAR HEMOGLOBIN: 29.6 pg (ref 26.6–33.0)
MEAN CORPUSCULAR VOLUME: 90 fL (ref 79–97)
MONOCYTES ABSOLUTE COUNT: 0.7 10*3/uL (ref 0.1–0.9)
MONOCYTES RELATIVE PERCENT: 11 %
NEUTROPHILS ABSOLUTE COUNT: 3.7 10*3/uL (ref 1.4–7.0)
NEUTROPHILS RELATIVE PERCENT: 60 %
PLATELET COUNT: 238 10*3/uL (ref 150–450)
RED BLOOD CELL COUNT: 4.52 x10E6/uL (ref 3.77–5.28)
RED CELL DISTRIBUTION WIDTH: 13.1 % (ref 11.7–15.4)
WHITE BLOOD CELL COUNT: 6.2 10*3/uL (ref 3.4–10.8)

## 2023-10-23 LAB — COMPREHENSIVE METABOLIC PANEL
ALBUMIN: 4.3 g/dL (ref 3.9–4.9)
ALKALINE PHOSPHATASE: 70 IU/L (ref 44–121)
ALT (SGPT): 19 IU/L (ref 0–32)
AST (SGOT): 21 IU/L (ref 0–40)
BILIRUBIN TOTAL (MG/DL) IN SER/PLAS: 0.5 mg/dL (ref 0.0–1.2)
BLOOD UREA NITROGEN: 10 mg/dL (ref 8–27)
BUN / CREAT RATIO: 17 (ref 12–28)
CALCIUM: 8.9 mg/dL (ref 8.7–10.3)
CHLORIDE: 106 mmol/L (ref 96–106)
CO2: 25 mmol/L (ref 20–29)
CREATININE: 0.6 mg/dL (ref 0.57–1.00)
EGFR: 98 mL/min/{1.73_m2}
GLOBULIN, TOTAL: 2 g/dL (ref 1.5–4.5)
GLUCOSE: 89 mg/dL (ref 70–99)
POTASSIUM: 4 mmol/L (ref 3.5–5.2)
SODIUM: 144 mmol/L (ref 134–144)
TOTAL PROTEIN: 6.3 g/dL (ref 6.0–8.5)

## 2023-10-24 LAB — MAGNESIUM: MAGNESIUM: 1.5 mg/dL — ABNORMAL LOW (ref 1.6–2.3)

## 2023-10-24 LAB — PHOSPHORUS: PHOSPHORUS, SERUM: 2.5 mg/dL — ABNORMAL LOW (ref 3.0–4.3)

## 2023-10-25 LAB — TACROLIMUS LEVEL: TACROLIMUS BLOOD: 5.6 ng/mL (ref 2.0–20.0)

## 2023-10-26 NOTE — Unmapped (Signed)
Lab results from 10/23/23 were reviewed. No changes recommended at this time. Pt to repeating labs monthly.

## 2023-10-29 DIAGNOSIS — D485 Neoplasm of uncertain behavior of skin: Secondary | ICD-10-CM | POA: Diagnosis not present

## 2023-11-02 DIAGNOSIS — Z940 Kidney transplant status: Principal | ICD-10-CM

## 2023-11-02 NOTE — Unmapped (Signed)
Patient called and stated she has 6 days of Envarsus left. Message sent to Cecilie Lowers, CPP, Vern Claude, CPP, Johnette [place], [place] and Rogene [place] due to ongoing conversations regarding options and income documentation needed for MFG assistance. Stressed to the pt the importance of sending 2024 Social Security letter to [employer] to reopen the referral for MFG assistance. Pt stated she would like the fax number sent to her via MyChart and she would send it today.

## 2023-11-07 DIAGNOSIS — Z940 Kidney transplant status: Principal | ICD-10-CM

## 2023-11-08 DIAGNOSIS — Z79899 Other long term (current) drug therapy: Secondary | ICD-10-CM | POA: Diagnosis not present

## 2023-11-08 DIAGNOSIS — Z944 Liver transplant status: Secondary | ICD-10-CM | POA: Diagnosis not present

## 2023-11-08 DIAGNOSIS — C50919 Malignant neoplasm of unspecified site of unspecified female breast: Secondary | ICD-10-CM | POA: Diagnosis not present

## 2023-11-08 DIAGNOSIS — M545 Low back pain, unspecified: Secondary | ICD-10-CM | POA: Diagnosis not present

## 2023-11-08 DIAGNOSIS — Z0000 Encounter for general adult medical examination without abnormal findings: Secondary | ICD-10-CM | POA: Diagnosis not present

## 2023-11-08 DIAGNOSIS — H905 Unspecified sensorineural hearing loss: Secondary | ICD-10-CM | POA: Diagnosis not present

## 2023-11-08 NOTE — Unmapped (Signed)
Pt called stating she hasn't received a call from [employer] regarding Envarsus being shipped out. Gave pt the number [phone] to check on delivery of medication.

## 2023-11-11 DIAGNOSIS — N048 Nephrotic syndrome with other morphologic changes: Principal | ICD-10-CM

## 2023-11-11 DIAGNOSIS — N022 Recurrent and persistent hematuria with diffuse membranous glomerulonephritis: Principal | ICD-10-CM

## 2023-11-11 DIAGNOSIS — N042 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: Principal | ICD-10-CM

## 2023-11-11 DIAGNOSIS — Z940 Kidney transplant status: Principal | ICD-10-CM

## 2023-11-12 DIAGNOSIS — E785 Hyperlipidemia, unspecified: Secondary | ICD-10-CM | POA: Diagnosis not present

## 2023-11-12 DIAGNOSIS — K219 Gastro-esophageal reflux disease without esophagitis: Secondary | ICD-10-CM | POA: Diagnosis not present

## 2023-11-12 DIAGNOSIS — D849 Immunodeficiency, unspecified: Principal | ICD-10-CM

## 2023-11-12 DIAGNOSIS — Z940 Kidney transplant status: Principal | ICD-10-CM

## 2023-11-13 ENCOUNTER — Other Ambulatory Visit: Payer: Self-pay | Admitting: Plastic Surgery

## 2023-11-13 DIAGNOSIS — Z79899 Other long term (current) drug therapy: Secondary | ICD-10-CM | POA: Diagnosis not present

## 2023-11-13 DIAGNOSIS — D485 Neoplasm of uncertain behavior of skin: Secondary | ICD-10-CM | POA: Diagnosis not present

## 2023-11-13 DIAGNOSIS — L988 Other specified disorders of the skin and subcutaneous tissue: Secondary | ICD-10-CM | POA: Diagnosis not present

## 2023-11-13 DIAGNOSIS — D2221 Melanocytic nevi of right ear and external auricular canal: Secondary | ICD-10-CM | POA: Diagnosis not present

## 2023-11-13 DIAGNOSIS — Z944 Liver transplant status: Principal | ICD-10-CM

## 2023-11-13 NOTE — Unmapped (Signed)
1/14 - on 1/9 spoke with pt to schedule for '25 annual liver txp follow up, per call able to secure in person date for labs with provider appt and pt okay with appt letter by mail. Pt verbalized understanding all discussed.

## 2023-11-15 ENCOUNTER — Ambulatory Visit: Admit: 2023-11-15 | Discharge: 2023-11-16 | Payer: MEDICARE

## 2023-11-15 ENCOUNTER — Ambulatory Visit: Admit: 2023-11-15 | Discharge: 2023-11-16 | Payer: MEDICARE | Attending: Nephrology | Primary: Nephrology

## 2023-11-15 DIAGNOSIS — R748 Abnormal levels of other serum enzymes: Secondary | ICD-10-CM | POA: Diagnosis not present

## 2023-11-15 DIAGNOSIS — D849 Immunodeficiency, unspecified: Secondary | ICD-10-CM | POA: Diagnosis not present

## 2023-11-15 DIAGNOSIS — R799 Abnormal finding of blood chemistry, unspecified: Secondary | ICD-10-CM | POA: Diagnosis not present

## 2023-11-15 DIAGNOSIS — R8279 Other abnormal findings on microbiological examination of urine: Secondary | ICD-10-CM | POA: Diagnosis not present

## 2023-11-15 DIAGNOSIS — D472 Monoclonal gammopathy: Secondary | ICD-10-CM | POA: Diagnosis not present

## 2023-11-15 DIAGNOSIS — Z940 Kidney transplant status: Secondary | ICD-10-CM | POA: Diagnosis not present

## 2023-11-15 DIAGNOSIS — Z944 Liver transplant status: Secondary | ICD-10-CM | POA: Diagnosis not present

## 2023-11-15 DIAGNOSIS — N022 Recurrent and persistent hematuria with diffuse membranous glomerulonephritis: Secondary | ICD-10-CM | POA: Diagnosis not present

## 2023-11-15 DIAGNOSIS — D509 Iron deficiency anemia, unspecified: Secondary | ICD-10-CM | POA: Diagnosis not present

## 2023-11-15 LAB — CBC W/ AUTO DIFF
BASOPHILS ABSOLUTE COUNT: 0.1 10*9/L (ref 0.0–0.1)
BASOPHILS RELATIVE PERCENT: 1 %
EOSINOPHILS ABSOLUTE COUNT: 0.2 10*9/L (ref 0.0–0.5)
EOSINOPHILS RELATIVE PERCENT: 2.9 %
HEMATOCRIT: 38.7 % (ref 34.0–44.0)
HEMOGLOBIN: 13.3 g/dL (ref 11.3–14.9)
LYMPHOCYTES ABSOLUTE COUNT: 1.6 10*9/L (ref 1.1–3.6)
LYMPHOCYTES RELATIVE PERCENT: 26.2 %
MEAN CORPUSCULAR HEMOGLOBIN CONC: 34.5 g/dL (ref 32.0–36.0)
MEAN CORPUSCULAR HEMOGLOBIN: 29.9 pg (ref 25.9–32.4)
MEAN CORPUSCULAR VOLUME: 86.8 fL (ref 77.6–95.7)
MEAN PLATELET VOLUME: 10 fL (ref 6.8–10.7)
MONOCYTES ABSOLUTE COUNT: 0.7 10*9/L (ref 0.3–0.8)
MONOCYTES RELATIVE PERCENT: 11.4 %
NEUTROPHILS ABSOLUTE COUNT: 3.6 10*9/L (ref 1.8–7.8)
NEUTROPHILS RELATIVE PERCENT: 58.5 %
PLATELET COUNT: 248 10*9/L (ref 150–450)
RED BLOOD CELL COUNT: 4.46 10*12/L (ref 3.95–5.13)
RED CELL DISTRIBUTION WIDTH: 13.3 % (ref 12.2–15.2)
WBC ADJUSTED: 6.1 10*9/L (ref 3.6–11.2)

## 2023-11-15 LAB — COMPREHENSIVE METABOLIC PANEL
ALBUMIN: 3.9 g/dL (ref 3.4–5.0)
ALKALINE PHOSPHATASE: 67 U/L (ref 46–116)
ALT (SGPT): 15 U/L (ref 10–49)
ANION GAP: 12 mmol/L (ref 5–14)
AST (SGOT): 17 U/L (ref <=34)
BILIRUBIN TOTAL: 0.5 mg/dL (ref 0.3–1.2)
BLOOD UREA NITROGEN: 12 mg/dL (ref 9–23)
BUN / CREAT RATIO: 24
CALCIUM: 9.8 mg/dL (ref 8.7–10.4)
CHLORIDE: 104 mmol/L (ref 98–107)
CO2: 26.9 mmol/L (ref 20.0–31.0)
CREATININE: 0.51 mg/dL — ABNORMAL LOW (ref 0.55–1.02)
EGFR CKD-EPI (2021) FEMALE: >90 mL/min/{1.73_m2} (ref >=60)
GLUCOSE RANDOM: 103 mg/dL (ref 70–179)
POTASSIUM: 3.9 mmol/L (ref 3.4–4.8)
PROTEIN TOTAL: 7.1 g/dL (ref 5.7–8.2)
SODIUM: 143 mmol/L (ref 135–145)

## 2023-11-15 LAB — URINALYSIS WITH MICROSCOPY
BACTERIA: NONE SEEN /HPF
BILIRUBIN UA: NEGATIVE
BLOOD UA: NEGATIVE
GLUCOSE UA: NEGATIVE
KETONES UA: NEGATIVE
NITRITE UA: NEGATIVE
PH UA: 5 (ref 5.0–9.0)
RBC UA: 2 /HPF (ref <=4)
SPECIFIC GRAVITY UA: 1.017 (ref 1.003–1.030)
SQUAMOUS EPITHELIAL: 5 /HPF (ref 0–5)
UROBILINOGEN UA: <2
WBC UA: 4 /HPF (ref 0–5)

## 2023-11-15 LAB — IRON & TIBC
IRON SATURATION: 29 % (ref 20–55)
IRON: 78 ug/dL (ref 50–170)
TOTAL IRON BINDING CAPACITY: 269 ug/dL (ref 250–425)

## 2023-11-15 LAB — PHOSPHORUS: PHOSPHORUS: 2.8 mg/dL (ref 2.4–5.1)

## 2023-11-15 LAB — MAGNESIUM: MAGNESIUM: 1.4 mg/dL — ABNORMAL LOW (ref 1.6–2.6)

## 2023-11-15 LAB — TACROLIMUS LEVEL, TROUGH: TACROLIMUS, TROUGH: 4.2 ng/mL — ABNORMAL LOW (ref 5.0–15.0)

## 2023-11-15 LAB — PROTEIN / CREATININE RATIO, URINE
CREATININE, URINE: 88.5 mg/dL
PROTEIN URINE: 20.5 mg/dL
PROTEIN/CREAT RATIO, URINE: 0.232

## 2023-11-15 LAB — GAMMA GT: GAMMA GLUTAMYL TRANSFERASE: 35 U/L (ref 0–38)

## 2023-11-15 LAB — FERRITIN: FERRITIN: 143 ng/mL (ref 7.3–270.7)

## 2023-11-15 LAB — ALBUMIN / CREATININE URINE RATIO
ALBUMIN QUANT URINE: 7.5 mg/dL
ALBUMIN/CREATININE RATIO: 84.7 ug/mg — ABNORMAL HIGH (ref 0.0–30.0)
CREATININE, URINE: 88.5 mg/dL

## 2023-11-15 LAB — DERMATOLOGY PATHOLOGY

## 2023-11-15 NOTE — Unmapped (Signed)
Lab results from 11/15/23 were reviewed. No changes recommended at this time. Pt repeating labs monthly.

## 2023-11-15 NOTE — Unmapped (Signed)
Transplant Nephrology Clinic Visit    Assessment and Plan  Michelle Travis is a 68 y.o. 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 She has recurrent PLA2R positive membranous nephropathy and is seen for follow up of her transplant and associated medical problems.     # Status post kidney transplant   # Membranous nephropathy recurrence.  - Kidney biopsy 03/11/21 with PLA2R positive membranous nephropathy  - Initial treatment included Rituximab 1 gm on 03/23/21 and 1 gm on 04/07/21 and 125 mg solumedrol x 2 given with the doses of Rituximab.   - Recurrence of nephrotic range proteinuria treated with Rituximab 1 gm 02/07/22, 03/07/22, 07/20/22, 01/19/23, 08/30/23. Next dose scheduled in April 2025.   - UPC peaked at 5.02 on 03/23/21 improved to 0.623 on 07/20/21 then peaked again to 8.66 on 02/07/22. With maintenance dosing rituximab UPC is improved, UPC today is 0.232 with UACR 84.7.   - Serum creatinine is stable at 0.51 mg/dL (baseline < 0.8 mg/dL)  - Will plan every 6 months rituximab.   - Patient at increased risk of DVT in view of membranous nephropathy recurrence. With normalization of UPC and serum albumin this risk is now much reduced. Will continue aspirin.     # S/P Liver transplant    - LFTs normal   - She will continue to follow with the liver transplant team.     # Immunosuppression Management   - Tacrolimus level is 4.2 (target 3-6)  - Continue Envarsus 2.5 mg daily   - Continue Myfortic 360 mg BID  - Continue rituximab every 6 months    # GI bleed, 09/2022, diverticular origin  # Iron Deficiency anemia, resolved  - Hospitalized 10/25/22-10/27/22 with rectal bleeding. Colonoscopy 10/26/22 with sigmoid, hepatic flexure, and ascending colon diverticulosis. Repeat colonoscopy 01/24/23 with similar results  - Denies recurrence of bleeding   - s/p feraheme 02/16/23, 02/28/23 and 03/12/23  - Iron saturation 29% today, Hemoglobin 13.3    # BP Management  - BP 132/79  - Currently on no antihypertensive therapy.    # Osteoartritis, knees and hips  # S/P right total hip replacement, 11/2021  - Previously suggested planned possible TKR be delayed until the response to membranous recurrence treatment can be fully evaluated. This is predominantly related to concern about post-op DVT.   - With improvement in proteinuria and serum albumin level she can once again engage her ortho team regarding timing for possible knee or hip surgery. Pt reports cost may be prohibitive but advised her again that now is the opportune time to get intervention if needed.    History of IgG lambda monoclonal gammopathy.   - SPEP with immunofixation 03/19/20 and 01/12/23 revealed no monoclonal protein. Kidney biopsy revealed no evidence of MGRS.    History of sleep apnea.   - She is no longer on CPAP.  - She denies daytime drowsiness.     Immunizations.  -  Prevnar-20 07/20/21.  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 S/p wedge excision of her right helical rim lesion on 11/13/23. Unable to find pathology reports in EHR  - Follow up with Etter Sjogren, MD     Cancer screening.   -  Mammography 06/01/23 negative, has remote history of breast cancer.  - PAP smear 11/20/22 negative for malignancy.  - Colonoscopy 10/26/22 and 01/24/23 with diverticulosis.    Follow-up.   Transplant nephrology clinic 3 months  Monthly labs including UPC  Rituximab 1 gm every 6 months    History of Present Illness  Michelle Travis is a 68 y.o. 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  She was diagnosed with recurrent membranous nephropathy on  03/11/21 and was treated with 2 doses of Rituximab 1 gm on 03/23/21 and 04/07/21 with improvement in proteinuria. UPC increased to 6.94 on 01/27/22 and she has been treated with 1 gm rituximab doses on 02/07/22, 03/07/22, 07/20/22, 01/19/23, and 08/30/23 with improvement in proteinuria.     Since last visit, patient reports she underwent wedge excision of her right helical rim lesion on 1/14. She denies noting any complications from surgery. Deneis any fever, chills, hearing changes, balance issues. No pathology reports on CE.    BP 132/79, not checking BP at home.    In regards to her transplant, she states she is doing well. Reports compliance with home Envarsus, Myfortic. She denies dysuria, allograft tenderness, chest pain, shortness of breath, edema, hemoptysis, calf tenderness, headaches, or hand tremors, GIB symptoms.     Immunosuppression is presently Envarsus 2.5mg  daily and Myfortic 360 mg bid.      Last dose of Envarsus: 8:23 AM yesterday.        Transplant History:  1. 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 S/P kidney and liver transplants on 09/13/17 at [facility]. Kidney KDPI 26%. CMV D+/R+; EBV D-/R+, hep C Ab +/NAT- (recipient hep C Ab negative)  4. Complicated by rectus sheath hematoma  5. Delayed graft function requiring hemodialysis sessions following transplant (stopped dialysis 11/26/018).  6. Immunosuppression was ??? induction followed by tacrolimus and myfortic maintenance therapy.   7. Baseline creatinine <1.0 mg/dL.  8. Kidney biopsy 03/11/21 with recurrent PLA2R positive membranous nephropathy, treated with Rituximab 1 gm x 4 (03/23/21, 04/07/21, 02/07/22, 03/07/22)   9. Rotavirus infection 04/2022 with AKI secondary to volume depletion    Past Medical History:  1. Alcoholic cirrhosis, S/P TIPS  2. Membranous nephropathy, PLA2R positive biopsy 08/30/2016, treated solumedrol initially with little improvement then with Rituximab (10/2016 and 03/2017).   3. S/P kidney and liver transplants as stated above  4. History of breast cancer, in remission for 17 years, s/p lumpectomy.  5. History of IGG monoclonal gammopathy, IgG lambda. Kidney biopsy 08/30/2016 without MGRS.  6. S/p cerebral aneurysm repair.  7. S/P total knee replacement, right   8. Essential HTN prior to ESLD  9. History of diverticulitis  10. Recurrent pleural effusion, history of pneumonia x 3  11. Osteopenia by DEXA 06/14/2016  12. Hypoattenuating thyroid nodule noted on chest CT 11/11/2016  13. Sleep apnea    Review of Systems    All other systems are reviewed and are negative.    Medications    Current Outpatient Medications   Medication Sig Dispense Refill    acetaminophen (TYLENOL 8 HOUR) 650 MG CR tablet Take 1 tablet (650 mg total) by mouth every four (4) hours as needed for pain. 30 tablet 0    atorvastatin (LIPITOR) 10 MG tablet Take 1 tablet (10 mg total) by mouth daily. 90 tablet 3  buprenorphine (BELBUCA) 150 mcg buccal film Apply 1 Film to cheek at bedtime.      cholecalciferol, vitamin D3 25 mcg, 1,000 units,, 1,000 unit (25 mcg) tablet Take 1 tablet (25 mcg total) by mouth in the morning. 90 tablet 3    ENVARSUS XR 1 mg Tb24 extended release tablet Take 1 tablet (1 mg total) by mouth in the morning. Take with TWO 0.75 mg tablets (1.5 mg total) by mouth in the morning for a total of 2.5 mg. 90 tablet 3    mycophenolate (MYFORTIC) 180 MG EC tablet Take 2 tablets (360 mg total) by mouth two (2) times a day. 360 tablet 3    oxyCODONE (ROXICODONE) 10 mg immediate release tablet TAKE 1 (ONE) TABLET BY MOUTH FOUR TIMES DAILY, AS NEEDED      pantoprazole (PROTONIX) 40 MG tablet Take 1 tablet (40 mg total) by mouth daily. 90 tablet 3    tacrolimus (ENVARSUS XR) 0.75 mg Tb24 extended release tablet Take 2 tablets (1.5 mg total) by mouth in the morning. Take with ONE 1 mg tablet by mouth in the morning for a total of 2.5 mg. 180 tablet 3    tacrolimus (ENVARSUS XR) 0.75 mg Tb24 extended release tablet Take 2 tablets (1.5 mg total) by mouth in the morning. Take in addition to 1 mg tablet for total daily dose of 2.5 mg once daily. 60 tablet 0    tacrolimus (ENVARSUS XR) 1 mg Tb24 extended release tablet Take 1 tablet (1 mg total) by mouth in the morning. Take in addition to 0.75 mg tablets for total daily dose of 2.5 mg daily. 30 tablet 0     No current facility-administered medications for this visit.       Physical Exam    BP 132/79 (BP Site: L Arm, BP Position: Sitting, BP Cuff Size: Medium)  - Pulse 64  - Temp 36.2 ??C (97.1 ??F) (Temporal)  - Ht 160 cm (5' 3)  - Wt 83.5 kg (184 lb)  - LMP  (LMP Unknown)  - BMI 32.59 kg/m??   General: Patient is a pleasant female in no apparent distress.  Eyes: Sclera anicteric.  Neck: Supple without LAD/JVD/bruits.  Lungs: Clear to auscultation bilaterally, no wheezes/rales/rhonchi.  Cardiovascular: Regular rate and rhythm without murmurs, rubs or gallops.  Abdomen: Soft, notender/nondistended. Positive bowel sounds. No hepatosplenomegaly, masses or bruits appreciated.   Extremities: no edema  Skin: Without rash  Neurological: Grossly nonfocal.  Psychiatric: Mood and affect appropriate.    Laboratory Results and Imaging Reviewed

## 2023-11-16 DIAGNOSIS — D485 Neoplasm of uncertain behavior of skin: Secondary | ICD-10-CM | POA: Diagnosis not present

## 2023-11-16 DIAGNOSIS — Z944 Liver transplant status: Principal | ICD-10-CM

## 2023-11-16 DIAGNOSIS — Z940 Kidney transplant status: Principal | ICD-10-CM

## 2023-11-16 NOTE — Unmapped (Signed)
Lab results from 11/15/23 were reviewed by Dr. Margaretmary Bayley as seen in Epic. No changes recommended at this time. Pt repeating labs monthly.    Messaged Pt in MyChart.

## 2023-11-19 DIAGNOSIS — Z944 Liver transplant status: Principal | ICD-10-CM

## 2023-11-19 DIAGNOSIS — Z796 Long-term use of immunosuppressant medication: Principal | ICD-10-CM

## 2023-11-20 DIAGNOSIS — Z944 Liver transplant status: Principal | ICD-10-CM

## 2023-11-20 DIAGNOSIS — Z940 Kidney transplant status: Principal | ICD-10-CM

## 2023-11-21 NOTE — Unmapped (Signed)
30-Day Courtesy Fill at Specialty and Home Delivery Pharmacy    [name] will be provided assistance for the following medication(s) using the PADD fund from [facility].     Myfortic 180 mg    Patient reports having 10 days of medication on hand as of 11/21/23. Delivery scheduled for 11/27/23 via UPS.     Request made by: Grace Blight    Reason for assistance:  Patient responsibility for Medicare Part B/C is unaffordable. No possible grants/assistance programs. Patient does not qualify for Medicaid. M3P not an option.    Approved by: Kayleen Memos

## 2023-11-21 NOTE — Unmapped (Signed)
[facility] Refill Coordination Note    Specialty Medication(s) to be Shipped:   Transplant: Myfortic 180mg     Other medication(s) to be shipped: No additional medications requested for fill at this time  Patient declined all other medications     Michelle Travis, DOB: 04-Jun-1956  Phone: [phone] (home)       All above HIPAA information was verified with patient.     Was a Nurse, learning disability used for this call? No    Completed refill call assessment today to schedule patient's medication shipment from the [facility]  [phone]).  All relevant notes have been reviewed.     Specialty medication(s) and dose(s) confirmed: Regimen is correct and unchanged.   Changes to medications: [name] reports no changes at this time.  Changes to insurance: No  New side effects reported not previously addressed with a pharmacist or physician: None reported  Questions for the pharmacist: No    Confirmed patient received a Conservation officer, historic buildings and a Surveyor, mining with first shipment. The patient will receive a drug information handout for each medication shipped and additional FDA Medication Guides as required.       DISEASE/MEDICATION-SPECIFIC INFORMATION        N/A    SPECIALTY MEDICATION ADHERENCE     Medication Adherence    Patient reported X missed doses in the last month: 0  Specialty Medication: Myfortic 180mg   Patient is on additional specialty medications: No  Adherence tools used: patient uses a pill box to manage medications              Were doses missed due to medication being on hold? No    Myfortic 180 mg: 10 days of medicine on hand     REFERRAL TO PHARMACIST     Referral to the pharmacist: Not needed      [facility]     Shipping address confirmed in Epic.       Delivery Scheduled: Yes, Expected medication delivery date: 11/27/23.     Medication will be delivered via UPS to the prescription address in Epic WAM.    [name], [facility]   [facility]  Specialty Pharmacist

## 2023-11-22 DIAGNOSIS — Z940 Kidney transplant status: Secondary | ICD-10-CM | POA: Diagnosis not present

## 2023-11-22 DIAGNOSIS — Z944 Liver transplant status: Secondary | ICD-10-CM | POA: Diagnosis not present

## 2023-11-22 DIAGNOSIS — D849 Immunodeficiency, unspecified: Secondary | ICD-10-CM | POA: Diagnosis not present

## 2023-11-22 DIAGNOSIS — Z7960 Long term (current) use of unspecified immunomodulators and immunosuppressants: Secondary | ICD-10-CM | POA: Diagnosis not present

## 2023-11-22 LAB — COMPREHENSIVE METABOLIC PANEL
ALBUMIN: 4.2 g/dL (ref 3.9–4.9)
ALKALINE PHOSPHATASE: 72 IU/L (ref 44–121)
ALT (SGPT): 15 IU/L (ref 0–32)
AST (SGOT): 13 IU/L (ref 0–40)
BILIRUBIN TOTAL (MG/DL) IN SER/PLAS: 0.5 mg/dL (ref 0.0–1.2)
BLOOD UREA NITROGEN: 14 mg/dL (ref 8–27)
BUN / CREAT RATIO: 24 (ref 12–28)
CALCIUM: 8.8 mg/dL (ref 8.7–10.3)
CHLORIDE: 104 mmol/L (ref 96–106)
CO2: 24 mmol/L (ref 20–29)
CREATININE: 0.59 mg/dL (ref 0.57–1.00)
EGFR: 98 mL/min/{1.73_m2}
GLOBULIN, TOTAL: 2.2 g/dL (ref 1.5–4.5)
GLUCOSE: 105 mg/dL — ABNORMAL HIGH (ref 70–99)
POTASSIUM: 4.3 mmol/L (ref 3.5–5.2)
SODIUM: 143 mmol/L (ref 134–144)
TOTAL PROTEIN: 6.4 g/dL (ref 6.0–8.5)

## 2023-11-22 LAB — CBC W/ DIFFERENTIAL
BANDED NEUTROPHILS ABSOLUTE COUNT: 0 10*3/uL (ref 0.0–0.1)
BASOPHILS ABSOLUTE COUNT: 0.1 10*3/uL (ref 0.0–0.2)
BASOPHILS RELATIVE PERCENT: 1 %
EOSINOPHILS ABSOLUTE COUNT: 0.2 10*3/uL (ref 0.0–0.4)
EOSINOPHILS RELATIVE PERCENT: 4 %
HEMATOCRIT: 41.1 % (ref 34.0–46.6)
HEMOGLOBIN: 13.2 g/dL (ref 11.1–15.9)
IMMATURE GRANULOCYTES: 0 %
LYMPHOCYTES ABSOLUTE COUNT: 1.5 10*3/uL (ref 0.7–3.1)
LYMPHOCYTES RELATIVE PERCENT: 27 %
MEAN CORPUSCULAR HEMOGLOBIN CONC: 32.1 g/dL (ref 31.5–35.7)
MEAN CORPUSCULAR HEMOGLOBIN: 29.1 pg (ref 26.6–33.0)
MEAN CORPUSCULAR VOLUME: 91 fL (ref 79–97)
MONOCYTES ABSOLUTE COUNT: 0.5 10*3/uL (ref 0.1–0.9)
MONOCYTES RELATIVE PERCENT: 9 %
NEUTROPHILS ABSOLUTE COUNT: 3.4 10*3/uL (ref 1.4–7.0)
NEUTROPHILS RELATIVE PERCENT: 59 %
PLATELET COUNT: 253 10*3/uL (ref 150–450)
RED BLOOD CELL COUNT: 4.54 x10E6/uL (ref 3.77–5.28)
RED CELL DISTRIBUTION WIDTH: 12.5 % (ref 11.7–15.4)
WHITE BLOOD CELL COUNT: 5.7 10*3/uL (ref 3.4–10.8)

## 2023-11-23 LAB — PHOSPHORUS: PHOSPHORUS, SERUM: 2.9 mg/dL — ABNORMAL LOW (ref 3.0–4.3)

## 2023-11-23 LAB — MAGNESIUM: MAGNESIUM: 1.4 mg/dL — ABNORMAL LOW (ref 1.6–2.3)

## 2023-11-23 LAB — TACROLIMUS LEVEL: TACROLIMUS BLOOD: 8.2 ng/mL (ref 2.0–20.0)

## 2023-11-23 LAB — GAMMA GT: GAMMA GLUTAMYL TRANSFERASE: 27 IU/L (ref 0–60)

## 2023-11-23 LAB — BILIRUBIN, DIRECT: BILIRUBIN DIRECT: 0.14 mg/dL (ref 0.00–0.40)

## 2023-11-25 NOTE — Unmapped (Addendum)
Lab results from 11/22/23 were reviewed by Dr. Sherryll Burger and Dr. Margaretmary Bayley as seen in [place]. Defer to kidney for any tacrolimus changes, per Dr. Margaretmary Bayley, no change.

## 2023-11-26 DIAGNOSIS — C4442 Squamous cell carcinoma of skin of scalp and neck: Secondary | ICD-10-CM | POA: Diagnosis not present

## 2023-11-26 DIAGNOSIS — Z944 Liver transplant status: Principal | ICD-10-CM

## 2023-11-26 DIAGNOSIS — Z940 Kidney transplant status: Principal | ICD-10-CM

## 2023-11-26 NOTE — Unmapped (Signed)
Reviewed tacrolimus level 8.2 with Dr. Margaretmary Bayley and patient, ~25-hour trough, recommended no changes at this time. Liver TNC aware.

## 2023-12-09 DIAGNOSIS — N022 Recurrent and persistent hematuria with diffuse membranous glomerulonephritis: Principal | ICD-10-CM

## 2023-12-09 DIAGNOSIS — N048 Nephrotic syndrome with other morphologic changes: Principal | ICD-10-CM

## 2023-12-09 DIAGNOSIS — N042 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: Principal | ICD-10-CM

## 2023-12-09 DIAGNOSIS — Z940 Kidney transplant status: Principal | ICD-10-CM

## 2023-12-10 DIAGNOSIS — R030 Elevated blood-pressure reading, without diagnosis of hypertension: Secondary | ICD-10-CM | POA: Diagnosis not present

## 2023-12-10 DIAGNOSIS — M545 Low back pain, unspecified: Secondary | ICD-10-CM | POA: Diagnosis not present

## 2023-12-10 DIAGNOSIS — Z79899 Other long term (current) drug therapy: Secondary | ICD-10-CM | POA: Diagnosis not present

## 2023-12-10 DIAGNOSIS — D485 Neoplasm of uncertain behavior of skin: Secondary | ICD-10-CM | POA: Diagnosis not present

## 2023-12-10 DIAGNOSIS — C4490 Unspecified malignant neoplasm of skin, unspecified: Secondary | ICD-10-CM | POA: Diagnosis not present

## 2023-12-10 DIAGNOSIS — Z940 Kidney transplant status: Principal | ICD-10-CM

## 2023-12-10 DIAGNOSIS — D849 Immunodeficiency, unspecified: Principal | ICD-10-CM

## 2023-12-13 DIAGNOSIS — K219 Gastro-esophageal reflux disease without esophagitis: Secondary | ICD-10-CM | POA: Diagnosis not present

## 2023-12-13 DIAGNOSIS — Z79899 Other long term (current) drug therapy: Secondary | ICD-10-CM | POA: Diagnosis not present

## 2023-12-13 DIAGNOSIS — E785 Hyperlipidemia, unspecified: Secondary | ICD-10-CM | POA: Diagnosis not present

## 2023-12-17 DIAGNOSIS — Z796 Long-term use of immunosuppressant medication: Principal | ICD-10-CM

## 2023-12-17 DIAGNOSIS — Z944 Liver transplant status: Principal | ICD-10-CM

## 2023-12-18 DIAGNOSIS — Z940 Kidney transplant status: Secondary | ICD-10-CM | POA: Diagnosis not present

## 2023-12-18 DIAGNOSIS — D849 Immunodeficiency, unspecified: Secondary | ICD-10-CM | POA: Diagnosis not present

## 2023-12-18 DIAGNOSIS — Z7960 Long term (current) use of unspecified immunomodulators and immunosuppressants: Secondary | ICD-10-CM | POA: Diagnosis not present

## 2023-12-18 DIAGNOSIS — Z944 Liver transplant status: Secondary | ICD-10-CM | POA: Diagnosis not present

## 2023-12-18 LAB — CBC W/ DIFFERENTIAL
BANDED NEUTROPHILS ABSOLUTE COUNT: 0 10*3/uL (ref 0.0–0.1)
BASOPHILS ABSOLUTE COUNT: 0 10*3/uL (ref 0.0–0.2)
BASOPHILS RELATIVE PERCENT: 1 %
EOSINOPHILS ABSOLUTE COUNT: 0.2 10*3/uL (ref 0.0–0.4)
EOSINOPHILS RELATIVE PERCENT: 3 %
HEMATOCRIT: 38.3 % (ref 34.0–46.6)
HEMOGLOBIN: 12.1 g/dL (ref 11.1–15.9)
IMMATURE GRANULOCYTES: 0 %
LYMPHOCYTES ABSOLUTE COUNT: 1.4 10*3/uL (ref 0.7–3.1)
LYMPHOCYTES RELATIVE PERCENT: 30 %
MEAN CORPUSCULAR HEMOGLOBIN CONC: 31.6 g/dL (ref 31.5–35.7)
MEAN CORPUSCULAR HEMOGLOBIN: 28.5 pg (ref 26.6–33.0)
MEAN CORPUSCULAR VOLUME: 90 fL (ref 79–97)
MONOCYTES ABSOLUTE COUNT: 0.7 10*3/uL (ref 0.1–0.9)
MONOCYTES RELATIVE PERCENT: 14 %
NEUTROPHILS ABSOLUTE COUNT: 2.6 10*3/uL (ref 1.4–7.0)
NEUTROPHILS RELATIVE PERCENT: 52 %
PLATELET COUNT: 263 10*3/uL (ref 150–450)
RED BLOOD CELL COUNT: 4.24 x10E6/uL (ref 3.77–5.28)
RED CELL DISTRIBUTION WIDTH: 12.6 % (ref 11.7–15.4)
WHITE BLOOD CELL COUNT: 4.8 10*3/uL (ref 3.4–10.8)

## 2023-12-19 LAB — COMPREHENSIVE METABOLIC PANEL
ALBUMIN: 4.4 g/dL (ref 3.9–4.9)
ALKALINE PHOSPHATASE: 68 IU/L (ref 44–121)
ALT (SGPT): 18 IU/L (ref 0–32)
AST (SGOT): 21 IU/L (ref 0–40)
BILIRUBIN TOTAL (MG/DL) IN SER/PLAS: 0.5 mg/dL (ref 0.0–1.2)
BLOOD UREA NITROGEN: 16 mg/dL (ref 8–27)
BUN / CREAT RATIO: 22 (ref 12–28)
CALCIUM: 9.4 mg/dL (ref 8.7–10.3)
CHLORIDE: 107 mmol/L — ABNORMAL HIGH (ref 96–106)
CO2: 22 mmol/L (ref 20–29)
CREATININE: 0.72 mg/dL (ref 0.57–1.00)
EGFR: 91 mL/min/{1.73_m2}
GLOBULIN, TOTAL: 2.3 g/dL (ref 1.5–4.5)
GLUCOSE: 92 mg/dL (ref 70–99)
POTASSIUM: 4.3 mmol/L (ref 3.5–5.2)
SODIUM: 146 mmol/L — ABNORMAL HIGH (ref 134–144)
TOTAL PROTEIN: 6.7 g/dL (ref 6.0–8.5)

## 2023-12-19 LAB — MAGNESIUM: MAGNESIUM: 1.7 mg/dL (ref 1.6–2.3)

## 2023-12-19 LAB — GAMMA GT: GAMMA GLUTAMYL TRANSFERASE: 32 IU/L (ref 0–60)

## 2023-12-19 LAB — BILIRUBIN, DIRECT: BILIRUBIN DIRECT: 0.14 mg/dL (ref 0.00–0.40)

## 2023-12-19 LAB — PHOSPHORUS: PHOSPHORUS, SERUM: 3 mg/dL (ref 3.0–4.3)

## 2023-12-19 LAB — TACROLIMUS LEVEL: TACROLIMUS BLOOD: 5.5 ng/mL (ref 5.0–20.0)

## 2023-12-20 DIAGNOSIS — Z940 Kidney transplant status: Principal | ICD-10-CM

## 2023-12-20 DIAGNOSIS — Z944 Liver transplant status: Principal | ICD-10-CM

## 2023-12-20 NOTE — Unmapped (Signed)
 Lab results from 12/18/23 were reviewed by Dr. Margaretmary Bayley as seen in Epic, tac level 5.5 (goal 3-5). No changes recommended at this time by the liver team. Will defer to kidney team for any changes in tacrolimus. Pt repeating labs monthly.

## 2023-12-21 ENCOUNTER — Encounter (HOSPITAL_BASED_OUTPATIENT_CLINIC_OR_DEPARTMENT_OTHER): Payer: Self-pay | Admitting: Emergency Medicine

## 2023-12-21 ENCOUNTER — Emergency Department (HOSPITAL_BASED_OUTPATIENT_CLINIC_OR_DEPARTMENT_OTHER): Payer: 59 | Admitting: Radiology

## 2023-12-21 ENCOUNTER — Other Ambulatory Visit: Payer: Self-pay

## 2023-12-21 ENCOUNTER — Emergency Department (HOSPITAL_BASED_OUTPATIENT_CLINIC_OR_DEPARTMENT_OTHER)
Admission: EM | Admit: 2023-12-21 | Discharge: 2023-12-21 | Disposition: A | Discharge status: Home / Self Care | Payer: 59 | Attending: Emergency Medicine | Admitting: Emergency Medicine

## 2023-12-21 DIAGNOSIS — Z79899 Other long term (current) drug therapy: Secondary | ICD-10-CM | POA: Diagnosis not present

## 2023-12-21 DIAGNOSIS — J209 Acute bronchitis, unspecified: Secondary | ICD-10-CM | POA: Diagnosis not present

## 2023-12-21 DIAGNOSIS — R059 Cough, unspecified: Secondary | ICD-10-CM | POA: Diagnosis not present

## 2023-12-21 DIAGNOSIS — Z91040 Latex allergy status: Secondary | ICD-10-CM | POA: Diagnosis not present

## 2023-12-21 DIAGNOSIS — R051 Acute cough: Secondary | ICD-10-CM

## 2023-12-21 DIAGNOSIS — R052 Subacute cough: Secondary | ICD-10-CM | POA: Diagnosis not present

## 2023-12-21 DIAGNOSIS — J101 Influenza due to other identified influenza virus with other respiratory manifestations: Secondary | ICD-10-CM | POA: Insufficient documentation

## 2023-12-21 DIAGNOSIS — N1832 Chronic kidney disease, stage 3b: Secondary | ICD-10-CM | POA: Diagnosis not present

## 2023-12-21 DIAGNOSIS — M791 Myalgia, unspecified site: Secondary | ICD-10-CM

## 2023-12-21 DIAGNOSIS — Z7982 Long term (current) use of aspirin: Secondary | ICD-10-CM | POA: Insufficient documentation

## 2023-12-21 DIAGNOSIS — I129 Hypertensive chronic kidney disease with stage 1 through stage 4 chronic kidney disease, or unspecified chronic kidney disease: Secondary | ICD-10-CM | POA: Diagnosis not present

## 2023-12-21 LAB — RESP PANEL BY RT-PCR (RSV, FLU A&B, COVID)  RVPGX2
Influenza A by PCR: POSITIVE — AB
Influenza B by PCR: NEGATIVE
Resp Syncytial Virus by PCR: NEGATIVE
SARS Coronavirus 2 by RT PCR: NEGATIVE

## 2023-12-21 MED ORDER — OSELTAMIVIR PHOSPHATE 75 MG PO CAPS: 75.0000 mg | ORAL_CAPSULE | Freq: Two times a day (BID) | ORAL | 0 refills | Status: AC

## 2023-12-21 MED ORDER — ALBUTEROL SULFATE HFA 108 (90 BASE) MCG/ACT IN AERS
1.0000 | INHALATION_SPRAY | Freq: Once | RESPIRATORY_TRACT | Status: AC
  Administered 2023-12-21: 2 via RESPIRATORY_TRACT
  Filled 2023-12-21: qty 6.7

## 2023-12-21 MED ORDER — OSELTAMIVIR PHOSPHATE 75 MG PO CAPS: 75.0000 mg | ORAL_CAPSULE | Freq: Two times a day (BID) | ORAL | 0 refills | Status: DC

## 2023-12-21 MED ORDER — AEROCHAMBER PLUS FLO-VU LARGE MISC
1.0000 | Freq: Once | Status: AC
  Administered 2023-12-21: 1
  Filled 2023-12-21: qty 1

## 2023-12-21 MED ORDER — BENZONATATE 100 MG PO CAPS: 100.0000 mg | ORAL_CAPSULE | Freq: Three times a day (TID) | ORAL | 0 refills | Status: AC | PRN

## 2023-12-21 NOTE — ED Triage Notes (Signed)
 Cough, congestion, and fever since yesterday. Denies CP or SHOB.

## 2023-12-21 NOTE — ED Provider Notes (Signed)
 [place] EMERGENCY DEPARTMENT AT [facility] Provider Note   CSN: [id number] Arrival date & time: 12/21/23  1555     History  Chief Complaint  Patient presents with   Cough    Savannah Jackson is a 68 y.o. female.   Cough   68 year old female presents emergency department with complaints of cough, body aches, fever.  Symptom onset yesterday.  Patient with known illness exposures through her daughter as well as grandson who are in the room with similar symptoms.  Patient has tried no medication for her symptoms.  Denies any difficulty breathing, chest pain, abdominal pain, nausea, vomiting.  Does report body aches all over.  Past medical history significant for hepatic encephalopathy, CKD 3B, membranous glomerulonephritis, breast cancer, brain aneurysm, DDD, cirrhosis status post liver transplant, chronic back pain, ethanol abuse, OSA, hypertension,, esophageal varices,  Home Medications Prior to Admission medications   Medication Sig Start Date End Date Taking? Authorizing Provider  benzonatate (TESSALON) 100 MG capsule Take 1 capsule (100 mg total) by mouth 3 (three) times daily as needed. 12/21/23  Yes Sherian Maroon A, PA  oseltamivir (TAMIFLU) 75 MG capsule Take 1 capsule (75 mg total) by mouth every 12 (twelve) hours. 12/21/23  Yes Sherian Maroon A, PA  acetaminophen (TYLENOL) 325 MG tablet Take 650 mg by mouth every 6 (six) hours as needed for headache or mild pain.    [provider]  aspirin 81 MG chewable tablet Chew 1 tablet (81 mg total) by mouth 2 (two) times daily. Patient taking differently: Chew 81 mg by mouth daily. 12/10/21   Kathryne Hitch, MD  atorvastatin (LIPITOR) 10 MG tablet Take 10 mg by mouth daily.  12/04/18   [provider]  Buprenorphine HCl 150 MCG FILM Take 150 mcg by mouth every other day. 10/17/21   [provider]  cholecalciferol (VITAMIN D) 1000 units tablet Take 1,000 Units by mouth daily.    [provider]  magic mouthwash (nystatin, lidocaine, diphenhydrAMINE, alum & mag hydroxide) suspension Swish and spit 10 mLs 3 (three) times daily. Use for the next week of until mouth ulcers improve 03/30/23   Gwyneth Sprout, MD  methocarbamol (ROBAXIN) 500 MG tablet Take 1 tablet (500 mg total) by mouth every 6 (six) hours as needed for muscle spasms. 12/10/21   Kathryne Hitch, MD  mycophenolate (MYFORTIC) 180 MG EC tablet Take 360 mg by mouth 2 (two) times daily.    [provider]  oxyCODONE (OXY IR/ROXICODONE) 5 MG immediate release tablet Take 1-2 tablets (5-10 mg total) by mouth every 4 (four) hours as needed for moderate pain (pain score 4-6). 12/10/21   Kathryne Hitch, MD  Oxycodone HCl 10 MG TABS Take 10 mg by mouth 5 (five) times daily as needed (pain). 01/12/23   [provider]  pantoprazole (PROTONIX) 40 MG tablet Take 40 mg by mouth daily.    [provider]  tacrolimus ER (ENVARSUS XR) 0.75 MG TB24 tablet Take 1.5 mg by mouth daily before breakfast.    [provider]  tacrolimus ER (ENVARSUS XR) 1 MG TB24 Take 1 mg by mouth daily before breakfast.    [provider]      Allergies    Latex and Vicodin [hydrocodone-acetaminophen]    Review of Systems   Review of Systems  Respiratory:  Positive for cough.   All other systems reviewed and are negative.   Physical Exam Updated Vital Signs BP 124/76 (BP Location: Right Arm)  Pulse 85   Temp 98.5 F (36.9 C) (Oral)   Resp 20   Ht 5\' 3"  (1.6 m)   Wt 83.9 kg   SpO2 94%   BMI 32.77 kg/m  Physical Exam Vitals and nursing note reviewed.  Constitutional:      General: She is not in acute distress.    Appearance: She is well-developed.  HENT:     Head: Normocephalic and atraumatic.  Eyes:     Conjunctiva/sclera: Conjunctivae normal.  Cardiovascular:     Rate and Rhythm: Normal rate and regular rhythm.     Heart sounds: No murmur heard. Pulmonary:      Effort: Pulmonary effort is normal. No respiratory distress.     Breath sounds: Wheezing present. No rales.  Abdominal:     Palpations: Abdomen is soft.     Tenderness: There is no abdominal tenderness. There is no guarding.  Musculoskeletal:        General: No swelling.     Cervical back: Neck supple.  Skin:    General: Skin is warm and dry.     Capillary Refill: Capillary refill takes less than 2 seconds.  Neurological:     Mental Status: She is alert.  Psychiatric:        Mood and Affect: Mood normal.     ED Results / Procedures / Treatments   Labs (all labs ordered are listed, but only abnormal results are displayed) Labs Reviewed  RESP PANEL BY RT-PCR (RSV, FLU A&B, COVID)  RVPGX2 - Abnormal; Notable for the following components:      Result Value   Influenza A by PCR POSITIVE (*)    All other components within normal limits    EKG None  Radiology DG Chest 2 View Result Date: 12/21/2023 CLINICAL DATA:  Cough. EXAM: CHEST - 2 VIEW COMPARISON:  09/20/2018. FINDINGS: There are nonspecific opacities overlying the bilateral lung bases, similar to prior study and favored to represent vascular structures versus scarring. Bilateral lung fields are otherwise clear. No dense consolidation or lung collapse. Bilateral costophrenic angles are clear. Stable cardio-mediastinal silhouette. No acute osseous abnormalities. The soft tissues are within normal limits. IMPRESSION: No active cardiopulmonary disease. Electronically Signed   By: Jules Schick M.D.   On: 12/21/2023 16:40    Procedures Procedures    Medications Ordered in ED Medications  albuterol (VENTOLIN HFA) 108 (90 Base) MCG/ACT inhaler 1-2 puff (2 puffs Inhalation Given 12/21/23 1733)  AeroChamber Plus Flo-Vu Large MISC 1 each (1 each Other Given 12/21/23 1733)    ED Course/ Medical Decision Making/ A&P                                 Medical Decision Making Amount and/or Complexity of Data Reviewed Radiology:  ordered.  Risk Prescription drug management.   This patient presents to the ED for concern of cough, body aches, this involves an extensive number of treatment options, and is a complaint that carries with it a high risk of complications and morbidity.  The differential diagnosis includes COVID, flu, RSV, pneumonia, sepsis, other   Co morbidities that complicate the patient evaluation  See HPI   Additional history obtained:  Additional history obtained from EMR External records from outside source obtained and reviewed including hospital records   Lab Tests:  I Ordered, and personally interpreted labs.  The pertinent results include: Influenza A positive   Imaging Studies ordered:  I ordered  imaging studies including chest x-ray I independently visualized and interpreted imaging which showed no acute cardiopulmonary abnormality I agree with the radiologist interpretation   Cardiac Monitoring: / EKG:  The patient was maintained on a cardiac monitor.  I personally viewed and interpreted the cardiac monitored which showed an underlying rhythm of: Sinus   Consultations Obtained:  N/a   Problem List / ED Course / Critical interventions / Medication management  Influenza A, cough, myalgia I ordered medication including albuterol   Reevaluation of the patient after these medicines showed that the patient improved I have reviewed the patients home medicines and have made adjustments as needed   Social Determinants of Health:  Denies tobacco, illicit drug use   Test / Admission - Considered:  Influenza A, cough, myalgia. Vitals signs within normal range and stable throughout visit. Laboratory/imaging studies significant for: See above 68 year old female presents emergency department with complaints of cough, body aches, fever.  Symptom onset yesterday.  Patient with multiple sick exposures in the outpatient setting from family members.  On exam, faint wheeze  auscultated expiratory.  Chest x-ray without evidence signs of pneumonia, pneumothorax or other acute cardiopulmonary abnormality.  Viral testing positive for influenza A.  Suspect the patient's symptoms likely secondary to influenza A.  Will recommend symptomatic therapy as described in AVS as well as close follow-up with PCP in the outpatient setting.  Treatment plan discussed at length with patient and she acknowledged understanding was agreeable to said plan.  Patient overall well-appearing, afebrile in no acute distress. Worrisome signs and symptoms were discussed with the patient, and the patient acknowledged understanding to return to the ED if noticed. Patient was stable upon discharge.          Final Clinical Impression(s) / ED Diagnoses Final diagnoses:  Influenza A  Acute cough  Myalgia    Rx / DC Orders ED Discharge Orders          Ordered    oseltamivir (TAMIFLU) 75 MG capsule  Every 12 hours,   Status:  Discontinued        12/21/23 1715    benzonatate (TESSALON) 100 MG capsule  3 times daily PRN        12/21/23 1715    oseltamivir (TAMIFLU) 75 MG capsule  Every 12 hours        12/21/23 1716              Peter Garter, [place] 12/21/23 1830    Vanetta Mulders, MD 12/22/23 1740

## 2023-12-21 NOTE — ED Notes (Signed)
 Pt discharged home after verbalizing understanding of discharge instructions; nad noted.

## 2023-12-21 NOTE — Discharge Instructions (Signed)
 As discussed, you did test positive for the flu which is most likely causing your symptoms.  Will begin Tamiflu given that you are within the window for beginning therapy.  Continue take Tylenol as needed for body aches, fever.  Recommend nasal steroid spray such as Nasacort/Flonase for treatment of any nasal congestion/drainage.  Will send in cough suppressant to use as needed.  Recommend close follow with your primary care for reassessment.  Please not hesitate to return to emergency department if the worrisome signs and symptoms to be discussed become apparent.

## 2023-12-28 DIAGNOSIS — Z940 Kidney transplant status: Principal | ICD-10-CM

## 2023-12-28 DIAGNOSIS — R748 Abnormal levels of other serum enzymes: Principal | ICD-10-CM

## 2023-12-28 DIAGNOSIS — R8279 Other abnormal findings on microbiological examination of urine: Principal | ICD-10-CM

## 2023-12-28 NOTE — Unmapped (Signed)
 [facility] orders.

## 2023-12-31 DIAGNOSIS — Z940 Kidney transplant status: Principal | ICD-10-CM

## 2024-01-02 DIAGNOSIS — Z940 Kidney transplant status: Principal | ICD-10-CM

## 2024-01-02 DIAGNOSIS — Z79899 Other long term (current) drug therapy: Principal | ICD-10-CM

## 2024-01-02 DIAGNOSIS — Z944 Liver transplant status: Principal | ICD-10-CM

## 2024-01-02 DIAGNOSIS — Z1159 Encounter for screening for other viral diseases: Principal | ICD-10-CM

## 2024-01-02 NOTE — Unmapped (Signed)
 Placed one time HCV RNA for 04/28/24 clinic visit.

## 2024-01-03 NOTE — Unmapped (Signed)
 Michelle Travis has been contacted in regards to their refill of mycophenolate: MYFORTIC 180 mg EC tablet. At this time, they have declined refill due to  having 14 days left of medication . Refill assessment call date has been updated per the patient's request.

## 2024-01-07 DIAGNOSIS — M545 Low back pain, unspecified: Secondary | ICD-10-CM | POA: Diagnosis not present

## 2024-01-07 DIAGNOSIS — Z79899 Other long term (current) drug therapy: Secondary | ICD-10-CM | POA: Diagnosis not present

## 2024-01-07 DIAGNOSIS — Z131 Encounter for screening for diabetes mellitus: Secondary | ICD-10-CM | POA: Diagnosis not present

## 2024-01-08 ENCOUNTER — Ambulatory Visit (INDEPENDENT_AMBULATORY_CARE_PROVIDER_SITE_OTHER): Admitting: Physician Assistant

## 2024-01-08 ENCOUNTER — Other Ambulatory Visit (INDEPENDENT_AMBULATORY_CARE_PROVIDER_SITE_OTHER)

## 2024-01-08 DIAGNOSIS — M5441 Lumbago with sciatica, right side: Secondary | ICD-10-CM | POA: Diagnosis not present

## 2024-01-08 DIAGNOSIS — R748 Abnormal levels of other serum enzymes: Secondary | ICD-10-CM | POA: Diagnosis not present

## 2024-01-08 DIAGNOSIS — M7061 Trochanteric bursitis, right hip: Secondary | ICD-10-CM

## 2024-01-08 DIAGNOSIS — Z940 Kidney transplant status: Secondary | ICD-10-CM | POA: Diagnosis not present

## 2024-01-08 LAB — CBC W/ DIFFERENTIAL
BANDED NEUTROPHILS ABSOLUTE COUNT: 0 10*3/uL (ref 0.0–0.1)
BASOPHILS ABSOLUTE COUNT: 0 10*3/uL (ref 0.0–0.2)
BASOPHILS RELATIVE PERCENT: 1 %
EOSINOPHILS ABSOLUTE COUNT: 0.1 10*3/uL (ref 0.0–0.4)
EOSINOPHILS RELATIVE PERCENT: 3 %
HEMATOCRIT: 43.3 % (ref 34.0–46.6)
HEMOGLOBIN: 13.6 g/dL (ref 11.1–15.9)
IMMATURE GRANULOCYTES: 0 %
LYMPHOCYTES ABSOLUTE COUNT: 1.1 10*3/uL (ref 0.7–3.1)
LYMPHOCYTES RELATIVE PERCENT: 31 %
MEAN CORPUSCULAR HEMOGLOBIN CONC: 31.4 g/dL — ABNORMAL LOW (ref 31.5–35.7)
MEAN CORPUSCULAR HEMOGLOBIN: 28.2 pg (ref 26.6–33.0)
MEAN CORPUSCULAR VOLUME: 90 fL (ref 79–97)
MONOCYTES ABSOLUTE COUNT: 0.5 10*3/uL (ref 0.1–0.9)
MONOCYTES RELATIVE PERCENT: 14 %
NEUTROPHILS ABSOLUTE COUNT: 1.8 10*3/uL (ref 1.4–7.0)
NEUTROPHILS RELATIVE PERCENT: 51 %
PLATELET COUNT: 306 10*3/uL (ref 150–450)
RED BLOOD CELL COUNT: 4.82 x10E6/uL (ref 3.77–5.28)
RED CELL DISTRIBUTION WIDTH: 12.4 % (ref 11.7–15.4)
WHITE BLOOD CELL COUNT: 3.6 10*3/uL (ref 3.4–10.8)

## 2024-01-08 MED ORDER — METHYLPREDNISOLONE ACETATE 40 MG/ML IJ SUSP
40.0000 mg | INTRAMUSCULAR | Status: AC | PRN
Start: 2024-01-08 — End: 2024-01-08
  Administered 2024-01-08: 40 mg via INTRA_ARTICULAR

## 2024-01-08 MED ORDER — LIDOCAINE HCL 1 % IJ SOLN
3.0000 mL | INTRAMUSCULAR | Status: AC | PRN
  Administered 2024-01-08: 3 mL

## 2024-01-08 NOTE — Progress Notes (Signed)
 Office Visit Note   Patient: Savannah Jackson           Date of Birth: 09-Mar-1956           MRN: [id number] Visit Date: 01/08/2024              Requested by: Benetta Spar, MD [address] [place],  [place] [address] PCP: Benetta Spar, MD   Assessment & Plan: Visit Diagnoses:  1. Acute right-sided low back pain with right-sided sciatica   2. Trochanteric bursitis, right hip     Plan: She will work on IT band stretching exercises.  If her symptoms fail to improve with the trochanteric injection she will follow-up with [place].  Questions were encouraged and answered at length.  Post trochanteric injection today she states that her pain had dissipated with ambulation before she left the office.  Follow-Up Instructions: Return if symptoms worsen or fail to improve.   Orders:  Orders Placed This Encounter  Procedures   Large Joint Inj: R greater trochanter   XR Lumbar Spine 2-3 Views   No orders of the defined types were placed in this encounter.     Procedures: Large Joint Inj: R greater trochanter on 01/08/2024 2:06 PM Indications: pain Details: 22 G 1.5 in needle, lateral approach  Arthrogram: No  Medications: 3 mL lidocaine 1 %; 40 mg methylPREDNISolone acetate 40 MG/ML Outcome: tolerated well, no immediate complications Procedure, treatment alternatives, risks and benefits explained, specific risks discussed. Consent was given by the patient. Immediately prior to procedure a time out was called to verify the correct patient, procedure, equipment, support staff and site/side marked as required. Patient was prepped and draped in the usual sterile fashion.       Clinical Data: No additional findings.   Subjective: Chief Complaint  Patient presents with   Lower Back - Pain    HPI Savannah Jackson comes in today with low back pain but mostly having pain right lateral thigh area.  She states that she has pain when lying on the hip.  Trouble  walking.  9 out of 10 pain.  Denies any numbness tingling down the leg.  Pain does awaken her whenever she turns over.  Denies any bowel or bladder dysfunction, saddle anesthesia like symptoms, she did recently have the flu and lost some 6 pounds due to that sickness.  She is using a cane to ambulate.  History of right total hip arthroplasty no groin pain.  She has had back pain in the past and actually was worked up with a CT scan did physical therapy and then was sent to Dr. Alvester Morin and her symptoms at that point in time despite being sent there for an epidural steroid were caused by her hip.  She had an intra-articular injection.  This happened to be the right hip.  Later underwent right total hip arthroplasty.  She has been taking Tylenol and she is on chronic oxycodone which she gets through pain management. Review of Systems  Constitutional:  Negative for chills and fever.  Respiratory:  Positive for cough.   Skin:  Positive for wound.     Objective: Vital Signs: There were no vitals taken for this visit.  Physical Exam Constitutional:      Appearance: She is not ill-appearing or diaphoretic.  Cardiovascular:     Pulses: Normal pulses.  Pulmonary:     Effort: Pulmonary effort is normal.  Neurological:     Mental Status: She is alert  and oriented to person, place, and time.  Psychiatric:        Mood and Affect: Mood normal.     Ortho Exam Lumbar spine: 5 out of 5 strength throughout lower extremities.  Negative straight leg raise bilaterally. Bilateral hips good range of motion of both hips without pain.  She has tenderness over the right hip trochanteric region.  No tenderness over the left trochanteric region.  Specialty Comments:  No specialty comments available.  Imaging: XR Lumbar Spine 2-3 Views Result Date: 01/08/2024 Lumbar spine 2 views: Compared to prior images no change in spondylolisthesis at L4-5 and remains grade 1.  There is slight progression of disc space loss  at L4-5.  No acute fractures.  Lower lumbar facet arthritic changes.  Right total hip arthroplasty acetabular component and hip ball are visualized and showed no acute findings.    PMFS History: Patient Active Problem List   Diagnosis Date Noted   Unilateral primary osteoarthritis, left knee 04/20/2022   Status post total hip replacement, right 12/09/2021   Unilateral primary osteoarthritis, right hip 09/26/2021   Immunosuppressed status (HCC) 03/22/2020   Encounter for gynecological examination with Papanicolaou smear of cervix 11/13/2019   Screening for colorectal cancer 11/13/2019   History of colonic polyps 05/17/2018   Aftercare following organ transplant 10/17/2017   Kidney transplanted 09/14/2017   Status post liver transplantation (HCC) 09/14/2017   Acute encephalopathy    Hyperammonemia (HCC) 07/15/2017   ESRD on dialysis (HCC)    Rectus sheath hematoma 11/22/2016   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   S/P thoracentesis    Acute renal failure superimposed on stage 4 chronic kidney disease (HCC)    Hypoalbuminemia 10/29/2016   Leukocytosis 10/29/2016   Diastolic dysfunction with chronic heart failure (HCC) 10/29/2016   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   Chronic kidney disease (CKD) stage G3b/A3, moderately decreased glomerular filtration rate (GFR) between 30-44 mL/min/1.73 square meter and albuminuria creatinine ratio greater than 300 mg/g (HCC) 08/30/2016   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 08/30/2016   Thickened endometrium 06/19/2016   PMB (postmenopausal bleeding) 06/07/2016   History of breast cancer 06/07/2016   Cough    Esophageal varices (HCC) 12/21/2015   Alcoholic cirrhosis of liver 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)    Anemia of chronic disease    Peripheral edema 11/26/2015   Anasarca 11/26/2015   Metabolic acidosis 10/19/2015   Lower urinary tract infectious disease 09/24/2015   Elevated troponin 09/24/2015    Altered mental status 08/30/2015   Hepatic encephalopathy (HCC) 08/30/2015   Acute respiratory failure with hypoxia (HCC)    Recurrent right pleural effusion    Alcoholic cirrhosis of liver with ascites (HCC)    Pleural effusion associated with hepatic disorder 07/03/2015   Chronic kidney disease (CKD), stage IV (severe) (HCC) 07/03/2015   Elevated INR 07/03/2015   Acute respiratory distress 07/03/2015   BRBPR (bright red blood per rectum) 04/21/2015   Pleural effusion, right 04/21/2015   Sleep apnea    Chronic back pain    ETOH abuse    Bilateral leg edema    Cirrhosis, alcoholic (HCC) 02/17/2013   Abdominal pain, left lower quadrant 02/17/2013   Diverticulitis of colon without hemorrhage 02/17/2013   Hypertension    Past Medical History:  Diagnosis Date   Bilateral leg edema    Brain aneurysm 1998   Cancer (HCC) 2002   breast cancer- lumpectromy, , chemotherapy, radiation   Chronic back pain Diverticultis   Chronic kidney disease (CKD) stage G3b/A3,  moderately decreased glomerular filtration rate (GFR) between 30-44 mL/min/1.73 square meter and albuminuria creatinine ratio greater than 300 mg/g (HCC) 08/30/2016   Cirrhosis (HCC)    alcoholic   DDD (degenerative disc disease), lumbar    Diverticulosis    scope 2014   ETOH abuse    Hepatic encephalopathy (HCC) 04/23/2017   Hepatomegaly    scope 2014   History of breast cancer 06/07/2016   Hypertension    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   Pneumonia    Rotator cuff syndrome of left shoulder    Sleep apnea    does not wear CPAP   Steal syndrome as complication of dialysis access (HCC)    Thickened endometrium 06/19/2016   Will get endo biopsy    Wears glasses     Family History  Problem Relation Age of Onset   Aneurysm Mother    Other Daughter        knee replacement   Hypertension Daughter     Past Surgical History:   Procedure Laterality Date   AV FISTULA PLACEMENT Left 05/22/2017   Procedure: BRACHIAL ARTERY TO BASCILIC VEIN  FISTULA CREATION;  Surgeon: Sherren Kerns, MD;  Location: [facility] OR;  Service: Vascular;  Laterality: Left;   BASCILIC VEIN TRANSPOSITION Left 08/07/2017   Procedure: BASILIC VEIN TRANSPOSITION SECOND STAGE;  Surgeon: Sherren Kerns, MD;  Location: [facility] OR;  Service: Vascular;  Laterality: Left;   BRAIN SURGERY     aneurysm at [facility]   BREAST LUMPECTOMY Right 2002   CATARACT EXTRACTION Bilateral    [facility] 2 or 3 years ago   CHOLECYSTECTOMY     COLONOSCOPY N/A 04/10/2013   Procedure: COLONOSCOPY;  Surgeon: Malissa Hippo, MD;  Location: [facility];  Service: Endoscopy;  Laterality: N/A;  1030-rescheduled to 1200 Ann notified pt   COLONOSCOPY N/A 08/07/2018   Procedure: COLONOSCOPY;  Surgeon: Malissa Hippo, MD;  Location: [facility];  Service: Endoscopy;  Laterality: N/A;  930   COLONOSCOPY WITH PROPOFOL N/A 01/24/2023   Procedure: COLONOSCOPY WITH PROPOFOL;  Surgeon: Corbin Ade, MD;  Location: [facility];  Service: Endoscopy;  Laterality: N/A;  12:45 pm, asa 3   ESOPHAGOGASTRODUODENOSCOPY N/A 04/22/2015   Procedure: ESOPHAGOGASTRODUODENOSCOPY (EGD);  Surgeon: Malissa Hippo, MD;  Location: [facility];  Service: Endoscopy;  Laterality: N/A;   EYE SURGERY     IR GENERIC HISTORICAL  07/04/2016   IR RADIOLOGIST EVAL & MGMT 07/04/2016 Malachy Moan, MD [facility] INTERV RAD   IR RADIOLOGIST EVAL & MGMT  07/24/2017   KIDNEY SURGERY     KIDNEY TRANSPLANT  2018   KNEE ARTHROSCOPY Left    LIVER TRANSPLANT  2018   NEPHRECTOMY TRANSPLANTED ORGAN     POLYPECTOMY  08/07/2018   Procedure: POLYPECTOMY;  Surgeon: Malissa Hippo, MD;  Location: [facility];  Service: Endoscopy;;  colon    RADIOLOGY WITH ANESTHESIA N/A 07/16/2015   Procedure: TIPS;  Surgeon: Medication Radiologist, MD;  Location: [facility] OR;  Service: Radiology;  Laterality: N/A;   REVISON OF ARTERIOVENOUS  FISTULA Left 01/06/2019   Procedure: LEFT ARM LIGATION OF ARTERIOVENOUS FISTULA;  Surgeon: Sherren Kerns, MD;  Location: [facility] OR;  Service: Vascular;  Laterality: Left;   ROTATOR CUFF REPAIR Left    TOTAL HIP ARTHROPLASTY Right 12/09/2021   Procedure: RIGHT TOTAL HIP ARTHROPLASTY ANTERIOR APPROACH;  Surgeon: Kathryne Hitch, MD;  Location: [facility] ORS;  Service: Orthopedics;  Laterality: Right;   TOTAL KNEE ARTHROPLASTY Right    right. 2002   Social History   Occupational History   Occupation: disabled  Tobacco Use   Smoking status: Never   Smokeless tobacco: Never  Vaping Use   Vaping status: Never Used  Substance and Sexual Activity   Alcohol use: No    Alcohol/week: 0.0 standard drinks of alcohol    Comment: none since 03/2015   Drug use: Never   Sexual activity: Not Currently    Birth control/protection: Post-menopausal, Abstinence

## 2024-01-09 LAB — COMPREHENSIVE METABOLIC PANEL
ALBUMIN: 4.2 g/dL (ref 3.9–4.9)
ALKALINE PHOSPHATASE: 68 IU/L (ref 44–121)
ALT (SGPT): 26 IU/L (ref 0–32)
AST (SGOT): 19 IU/L (ref 0–40)
BILIRUBIN TOTAL (MG/DL) IN SER/PLAS: 0.5 mg/dL (ref 0.0–1.2)
BLOOD UREA NITROGEN: 12 mg/dL (ref 8–27)
BUN / CREAT RATIO: 18 (ref 12–28)
CALCIUM: 9.1 mg/dL (ref 8.7–10.3)
CHLORIDE: 105 mmol/L (ref 96–106)
CO2: 23 mmol/L (ref 20–29)
CREATININE: 0.68 mg/dL (ref 0.57–1.00)
EGFR: 95 mL/min/{1.73_m2}
GLOBULIN, TOTAL: 2 g/dL (ref 1.5–4.5)
GLUCOSE: 91 mg/dL (ref 70–99)
POTASSIUM: 4.8 mmol/L (ref 3.5–5.2)
SODIUM: 143 mmol/L (ref 134–144)
TOTAL PROTEIN: 6.2 g/dL (ref 6.0–8.5)

## 2024-01-09 LAB — PHOSPHORUS: PHOSPHORUS, SERUM: 2.1 mg/dL — ABNORMAL LOW (ref 3.0–4.3)

## 2024-01-09 LAB — BILIRUBIN, DIRECT: BILIRUBIN DIRECT: 0.16 mg/dL (ref 0.00–0.40)

## 2024-01-09 LAB — TACROLIMUS LEVEL: TACROLIMUS BLOOD: 6.8 ng/mL (ref 5.0–20.0)

## 2024-01-09 LAB — MAGNESIUM: MAGNESIUM: 1.6 mg/dL (ref 1.6–2.3)

## 2024-01-09 LAB — GAMMA GT: GAMMA GLUTAMYL TRANSFERASE: 83 IU/L — ABNORMAL HIGH (ref 0–60)

## 2024-01-10 DIAGNOSIS — Z79899 Other long term (current) drug therapy: Secondary | ICD-10-CM | POA: Diagnosis not present

## 2024-01-10 DIAGNOSIS — Z940 Kidney transplant status: Principal | ICD-10-CM

## 2024-01-10 DIAGNOSIS — Z944 Liver transplant status: Principal | ICD-10-CM

## 2024-01-10 NOTE — Unmapped (Signed)
 Lab results from 01/08/24 were reviewed, tac level 6.8 (goal 3-5). No changes recommended at this time by the liver team. Will defer to kidney team for any changes in tacrolimus. Pt repeating labs monthly.

## 2024-01-15 NOTE — Unmapped (Signed)
 Tacrolimus level 6.8 reviewed with Dr. Margaretmary Bayley, recommended no changes at this time and will monitor next labs. Tacrolimus goal 3-6.

## 2024-01-18 DIAGNOSIS — E785 Hyperlipidemia, unspecified: Secondary | ICD-10-CM | POA: Diagnosis not present

## 2024-01-18 DIAGNOSIS — K219 Gastro-esophageal reflux disease without esophagitis: Secondary | ICD-10-CM | POA: Diagnosis not present

## 2024-01-21 DIAGNOSIS — Z940 Kidney transplant status: Principal | ICD-10-CM

## 2024-01-21 DIAGNOSIS — R748 Abnormal levels of other serum enzymes: Principal | ICD-10-CM

## 2024-01-22 NOTE — Unmapped (Signed)
 [facility] Refill Coordination Note    Specialty Medication(s) to be Shipped:   Transplant: Myfortic 180mg     Other medication(s) to be shipped: atorvastatin 10 MG tablet (LIPITOR), pantoprazole 40 MG tablet (Protonix)     COURTNY BENNISON, DOB: 03-13-1956  Phone: [phone] (home)       All above HIPAA information was verified with patient.     Was a Nurse, learning disability used for this call? No    Completed refill call assessment today to schedule patient's medication shipment from the [facility]  [phone]).  All relevant notes have been reviewed.     Specialty medication(s) and dose(s) confirmed: Regimen is correct and unchanged.   Changes to medications: Francessca reports no changes at this time.  Changes to insurance: No  New side effects reported not previously addressed with a pharmacist or physician: None reported  Questions for the pharmacist: No    Confirmed patient received a Conservation officer, historic buildings and a Surveyor, mining with first shipment. The patient will receive a drug information handout for each medication shipped and additional FDA Medication Guides as required.       DISEASE/MEDICATION-SPECIFIC INFORMATION        N/A    SPECIALTY MEDICATION ADHERENCE     Medication Adherence    Patient reported X missed doses in the last month: 0  Specialty Medication: mycophenolate: MYFORTIC 180 mg EC tablet  Patient is on additional specialty medications: No  Patient is on more than two specialty medications: No  Any gaps in refill history greater than 2 weeks in the last 3 months: no  Demonstrates understanding of importance of adherence: yes  Adherence tools used: patient uses a pill box to manage medications              Were doses missed due to medication being on hold? No    MYFORTIC 180  mg: 8 days of medicine on hand       REFERRAL TO PHARMACIST     Referral to the pharmacist: Not needed      [facility]     Shipping address confirmed in Epic.     Cost and Payment: Patient has a copay of $18.99. They are aware and have authorized the pharmacy to charge the credit card on file.    Delivery Scheduled: Yes, Expected medication delivery date: 01/30/24.     Medication will be delivered via UPS to the prescription address in Epic WAM.  Moshe Salisbury   [facility]  Specialty Technician

## 2024-01-25 DIAGNOSIS — Z940 Kidney transplant status: Principal | ICD-10-CM

## 2024-01-28 DIAGNOSIS — Z940 Kidney transplant status: Principal | ICD-10-CM

## 2024-01-28 DIAGNOSIS — Z944 Liver transplant status: Principal | ICD-10-CM

## 2024-01-29 DIAGNOSIS — R748 Abnormal levels of other serum enzymes: Secondary | ICD-10-CM | POA: Diagnosis not present

## 2024-01-29 DIAGNOSIS — Z940 Kidney transplant status: Secondary | ICD-10-CM | POA: Diagnosis not present

## 2024-01-29 LAB — COMPREHENSIVE METABOLIC PANEL
ALBUMIN: 4.3 g/dL (ref 3.9–4.9)
ALKALINE PHOSPHATASE: 70 IU/L (ref 44–121)
ALT (SGPT): 21 IU/L (ref 0–32)
AST (SGOT): 21 IU/L (ref 0–40)
BILIRUBIN TOTAL (MG/DL) IN SER/PLAS: 0.5 mg/dL (ref 0.0–1.2)
BLOOD UREA NITROGEN: 12 mg/dL (ref 8–27)
BUN / CREAT RATIO: 21 (ref 12–28)
CALCIUM: 9.8 mg/dL (ref 8.7–10.3)
CHLORIDE: 105 mmol/L (ref 96–106)
CO2: 23 mmol/L (ref 20–29)
CREATININE: 0.58 mg/dL (ref 0.57–1.00)
EGFR: 99 mL/min/{1.73_m2}
GLOBULIN, TOTAL: 2.4 g/dL (ref 1.5–4.5)
GLUCOSE: 91 mg/dL (ref 70–99)
POTASSIUM: 4.7 mmol/L (ref 3.5–5.2)
SODIUM: 144 mmol/L (ref 134–144)
TOTAL PROTEIN: 6.7 g/dL (ref 6.0–8.5)

## 2024-01-29 LAB — CBC W/ DIFFERENTIAL
BANDED NEUTROPHILS ABSOLUTE COUNT: 0 10*3/uL (ref 0.0–0.1)
BASOPHILS ABSOLUTE COUNT: 0 10*3/uL (ref 0.0–0.2)
BASOPHILS RELATIVE PERCENT: 1 %
EOSINOPHILS ABSOLUTE COUNT: 0.2 10*3/uL (ref 0.0–0.4)
EOSINOPHILS RELATIVE PERCENT: 3 %
HEMATOCRIT: 42.1 % (ref 34.0–46.6)
HEMOGLOBIN: 13.7 g/dL (ref 11.1–15.9)
IMMATURE GRANULOCYTES: 1 %
LYMPHOCYTES ABSOLUTE COUNT: 1.4 10*3/uL (ref 0.7–3.1)
LYMPHOCYTES RELATIVE PERCENT: 23 %
MEAN CORPUSCULAR HEMOGLOBIN CONC: 32.5 g/dL (ref 31.5–35.7)
MEAN CORPUSCULAR HEMOGLOBIN: 29.1 pg (ref 26.6–33.0)
MEAN CORPUSCULAR VOLUME: 89 fL (ref 79–97)
MONOCYTES ABSOLUTE COUNT: 0.7 10*3/uL (ref 0.1–0.9)
MONOCYTES RELATIVE PERCENT: 12 %
NEUTROPHILS ABSOLUTE COUNT: 3.5 10*3/uL (ref 1.4–7.0)
NEUTROPHILS RELATIVE PERCENT: 60 %
PLATELET COUNT: 257 10*3/uL (ref 150–450)
RED BLOOD CELL COUNT: 4.71 x10E6/uL (ref 3.77–5.28)
RED CELL DISTRIBUTION WIDTH: 12.9 % (ref 11.7–15.4)
WHITE BLOOD CELL COUNT: 5.9 10*3/uL (ref 3.4–10.8)

## 2024-01-29 MED FILL — ATORVASTATIN 10 MG TABLET: ORAL | 90 days supply | Qty: 90 | Fill #2

## 2024-01-29 MED FILL — MYCOPHENOLATE SODIUM 180 MG TABLET,DELAYED RELEASE: ORAL | 30 days supply | Qty: 120 | Fill #3

## 2024-01-29 MED FILL — PANTOPRAZOLE 40 MG TABLET,DELAYED RELEASE: ORAL | 90 days supply | Qty: 90 | Fill #1

## 2024-01-30 LAB — TACROLIMUS LEVEL: TACROLIMUS BLOOD: 5.3 ng/mL (ref 5.0–20.0)

## 2024-01-30 LAB — GAMMA GT: GAMMA GLUTAMYL TRANSFERASE: 30 IU/L (ref 0–60)

## 2024-01-30 LAB — MAGNESIUM: MAGNESIUM: 1.7 mg/dL (ref 1.6–2.3)

## 2024-01-30 LAB — PHOSPHORUS: PHOSPHORUS, SERUM: 3.2 mg/dL (ref 3.0–4.3)

## 2024-01-30 LAB — BILIRUBIN, DIRECT: BILIRUBIN DIRECT: 0.14 mg/dL (ref 0.00–0.40)

## 2024-01-31 DIAGNOSIS — Z944 Liver transplant status: Principal | ICD-10-CM

## 2024-01-31 DIAGNOSIS — Z940 Kidney transplant status: Principal | ICD-10-CM

## 2024-01-31 NOTE — Unmapped (Signed)
 Lab results from 01/29/24 were reviewed. Liver labs stable, tac within goal. No changes recommended at this time.    Messaged Pt in MyChart.

## 2024-02-04 DIAGNOSIS — Z944 Liver transplant status: Principal | ICD-10-CM

## 2024-02-04 DIAGNOSIS — Z940 Kidney transplant status: Principal | ICD-10-CM

## 2024-02-05 DIAGNOSIS — M545 Low back pain, unspecified: Secondary | ICD-10-CM | POA: Diagnosis not present

## 2024-02-05 DIAGNOSIS — Z79899 Other long term (current) drug therapy: Secondary | ICD-10-CM | POA: Diagnosis not present

## 2024-02-05 DIAGNOSIS — R030 Elevated blood-pressure reading, without diagnosis of hypertension: Secondary | ICD-10-CM | POA: Diagnosis not present

## 2024-02-07 DIAGNOSIS — Z79899 Other long term (current) drug therapy: Secondary | ICD-10-CM | POA: Diagnosis not present

## 2024-02-08 DIAGNOSIS — Z940 Kidney transplant status: Principal | ICD-10-CM

## 2024-02-11 DIAGNOSIS — Z940 Kidney transplant status: Principal | ICD-10-CM

## 2024-02-13 DIAGNOSIS — Z940 Kidney transplant status: Principal | ICD-10-CM

## 2024-02-18 DIAGNOSIS — E785 Hyperlipidemia, unspecified: Secondary | ICD-10-CM | POA: Diagnosis not present

## 2024-02-18 DIAGNOSIS — K219 Gastro-esophageal reflux disease without esophagitis: Secondary | ICD-10-CM | POA: Diagnosis not present

## 2024-02-18 DIAGNOSIS — Z940 Kidney transplant status: Principal | ICD-10-CM

## 2024-02-18 DIAGNOSIS — R748 Abnormal levels of other serum enzymes: Principal | ICD-10-CM

## 2024-02-26 DIAGNOSIS — R748 Abnormal levels of other serum enzymes: Secondary | ICD-10-CM | POA: Diagnosis not present

## 2024-02-26 DIAGNOSIS — Z940 Kidney transplant status: Secondary | ICD-10-CM | POA: Diagnosis not present

## 2024-02-26 LAB — COMPREHENSIVE METABOLIC PANEL
ALBUMIN: 4.3 g/dL (ref 3.9–4.9)
ALKALINE PHOSPHATASE: 71 IU/L (ref 44–121)
ALT (SGPT): 15 IU/L (ref 0–32)
AST (SGOT): 19 IU/L (ref 0–40)
BILIRUBIN TOTAL (MG/DL) IN SER/PLAS: 0.4 mg/dL (ref 0.0–1.2)
BLOOD UREA NITROGEN: 10 mg/dL (ref 8–27)
BUN / CREAT RATIO: 18 (ref 12–28)
CALCIUM: 9.6 mg/dL (ref 8.7–10.3)
CHLORIDE: 104 mmol/L (ref 96–106)
CO2: 24 mmol/L (ref 20–29)
CREATININE: 0.57 mg/dL (ref 0.57–1.00)
EGFR: 99 mL/min/{1.73_m2}
GLOBULIN, TOTAL: 2.2 g/dL (ref 1.5–4.5)
GLUCOSE: 96 mg/dL (ref 70–99)
POTASSIUM: 4.5 mmol/L (ref 3.5–5.2)
SODIUM: 143 mmol/L (ref 134–144)
TOTAL PROTEIN: 6.5 g/dL (ref 6.0–8.5)

## 2024-02-26 LAB — CBC W/ DIFFERENTIAL
BANDED NEUTROPHILS ABSOLUTE COUNT: 0 10*3/uL (ref 0.0–0.1)
BASOPHILS ABSOLUTE COUNT: 0.1 10*3/uL (ref 0.0–0.2)
BASOPHILS RELATIVE PERCENT: 1 %
EOSINOPHILS ABSOLUTE COUNT: 0.2 10*3/uL (ref 0.0–0.4)
EOSINOPHILS RELATIVE PERCENT: 2 %
HEMATOCRIT: 41.8 % (ref 34.0–46.6)
HEMOGLOBIN: 13.4 g/dL (ref 11.1–15.9)
IMMATURE GRANULOCYTES: 1 %
LYMPHOCYTES ABSOLUTE COUNT: 1.6 10*3/uL (ref 0.7–3.1)
LYMPHOCYTES RELATIVE PERCENT: 26 %
MEAN CORPUSCULAR HEMOGLOBIN CONC: 32.1 g/dL (ref 31.5–35.7)
MEAN CORPUSCULAR HEMOGLOBIN: 28.9 pg (ref 26.6–33.0)
MEAN CORPUSCULAR VOLUME: 90 fL (ref 79–97)
MONOCYTES ABSOLUTE COUNT: 0.6 10*3/uL (ref 0.1–0.9)
MONOCYTES RELATIVE PERCENT: 10 %
NEUTROPHILS ABSOLUTE COUNT: 3.8 10*3/uL (ref 1.4–7.0)
NEUTROPHILS RELATIVE PERCENT: 60 %
PLATELET COUNT: 277 10*3/uL (ref 150–450)
RED BLOOD CELL COUNT: 4.63 x10E6/uL (ref 3.77–5.28)
RED CELL DISTRIBUTION WIDTH: 13.2 % (ref 11.7–15.4)
WHITE BLOOD CELL COUNT: 6.3 10*3/uL (ref 3.4–10.8)

## 2024-02-27 ENCOUNTER — Encounter: Admit: 2024-02-27 | Discharge: 2024-02-28 | Payer: Medicare (Managed Care)

## 2024-02-27 DIAGNOSIS — N042 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, unspecified: Secondary | ICD-10-CM | POA: Diagnosis not present

## 2024-02-27 DIAGNOSIS — N048 Nephrotic syndrome with other morphologic changes: Secondary | ICD-10-CM | POA: Diagnosis not present

## 2024-02-27 DIAGNOSIS — N022 Recurrent and persistent hematuria with diffuse membranous glomerulonephritis: Secondary | ICD-10-CM | POA: Diagnosis not present

## 2024-02-27 LAB — GAMMA GT: GAMMA GLUTAMYL TRANSFERASE: 25 IU/L (ref 0–60)

## 2024-02-27 LAB — PHOSPHORUS: PHOSPHORUS, SERUM: 2.9 mg/dL — ABNORMAL LOW (ref 3.0–4.3)

## 2024-02-27 LAB — MAGNESIUM: MAGNESIUM: 1.4 mg/dL — ABNORMAL LOW (ref 1.6–2.3)

## 2024-02-27 LAB — BILIRUBIN, DIRECT: BILIRUBIN DIRECT: 0.13 mg/dL (ref 0.00–0.40)

## 2024-02-27 MED ADMIN — acetaminophen (TYLENOL) tablet 650 mg: 650 mg | ORAL | @ 14:00:00 | Stop: 2024-02-27

## 2024-02-27 MED ADMIN — riTUXimab-abbs (TRUXIMA) 1,000 mg in sodium chloride (NS) 0.9 % 640 mL IVPB: 1000 mg | INTRAVENOUS | @ 14:00:00 | Stop: 2024-02-27

## 2024-02-27 MED ADMIN — methylPREDNISolone sodium succinate (PF) (SOLU-Medrol) injection 125 mg: 125 mg | INTRAVENOUS | @ 14:00:00 | Stop: 2024-02-27

## 2024-02-27 MED ADMIN — sodium chloride (NS) 0.9 % infusion: 20 mL/h | INTRAVENOUS | @ 14:00:00 | Stop: 2024-02-27

## 2024-02-27 MED ADMIN — diphenhydrAMINE (BENADRYL) injection 25 mg: 25 mg | INTRAVENOUS | @ 14:00:00 | Stop: 2024-02-27

## 2024-02-27 NOTE — Unmapped (Signed)
 1347 Rituximab infusion completed, pt tolerated infusion well.  1347 VS stable.  1401 PIV removed and site secured with coban.  1407 Patient left clinic today, condition: well; mobility: via wheelchair; accompanied by relative.

## 2024-02-27 NOTE — Unmapped (Signed)
 0960 Patient arrived to the Transplant Infusion room for Truxima 1000 mg infusion. Condition: well, Mobility: via wheelchair, accompanied by relative.   See Flowsheet and MAR for all details of visit.  4540 VS stable.   9811 PIV placed and secured with coban, labs collected and sent; urine no orders found.  9147 Premedications given.  Today's lab results: WBC 6.3 (must be >2.0 or hold infusion and notify provider) and Platelets 277 (must be > 50 or hold infusion and notify provider).   1029 Truxima infusion initiated rituximab: rituximab subsequent infusion protocol to start 100 mg/hr, rate increase by 100 mg/hr every 30 minutes as tolerated till maximum rate of 400 mg/hr. Patient educated to notify nursing staff if they experience urticaria, erythema, nausea, shortness of breath, metallic taste in mouth, excessive oral or postnasal drainage and any other changes in status which could be side effects from initiating this medication.

## 2024-02-28 DIAGNOSIS — Z940 Kidney transplant status: Principal | ICD-10-CM

## 2024-02-28 DIAGNOSIS — Z944 Liver transplant status: Principal | ICD-10-CM

## 2024-02-28 LAB — TACROLIMUS LEVEL: TACROLIMUS BLOOD: 7.8 ng/mL (ref 5.0–20.0)

## 2024-02-28 NOTE — Unmapped (Signed)
 [facility] Clinical Assessment & Refill Coordination Note    Michelle Travis, DOB: 1956/05/01  Phone: [phone] (home)     All above HIPAA information was verified with patient.     Was a Nurse, learning disability used for this call? No    Specialty Medication(s):   Transplant: mycophenolic acid 180mg      Current Outpatient Medications   Medication Sig Dispense Refill    acetaminophen (TYLENOL 8 HOUR) 650 MG CR tablet Take 1 tablet (650 mg total) by mouth every four (4) hours as needed for pain. 30 tablet 0    atorvastatin (LIPITOR) 10 MG tablet Take 1 tablet (10 mg total) by mouth daily. 90 tablet 3    buprenorphine (BELBUCA) 150 mcg buccal film Apply 1 Film to cheek at bedtime.      cholecalciferol, vitamin D3 25 mcg, 1,000 units,, 1,000 unit (25 mcg) tablet Take 1 tablet (25 mcg total) by mouth in the morning. 90 tablet 3    ENVARSUS XR 1 mg Tb24 extended release tablet Take 1 tablet (1 mg total) by mouth in the morning. Take with TWO 0.75 mg tablets (1.5 mg total) by mouth in the morning for a total of 2.5 mg. 90 tablet 3    mycophenolate (MYFORTIC) 180 MG EC tablet Take 2 tablets (360 mg total) by mouth two (2) times a day. 360 tablet 3    oxyCODONE (ROXICODONE) 10 mg immediate release tablet TAKE 1 (ONE) TABLET BY MOUTH FOUR TIMES DAILY, AS NEEDED      pantoprazole (PROTONIX) 40 MG tablet Take 1 tablet (40 mg total) by mouth daily. 90 tablet 3    tacrolimus (ENVARSUS XR) 0.75 mg Tb24 extended release tablet Take 2 tablets (1.5 mg total) by mouth in the morning. Take with ONE 1 mg tablet by mouth in the morning for a total of 2.5 mg. 180 tablet 3    tacrolimus (ENVARSUS XR) 0.75 mg Tb24 extended release tablet Take 2 tablets (1.5 mg total) by mouth in the morning. Take in addition to 1 mg tablet for total daily dose of 2.5 mg once daily. 60 tablet 0    tacrolimus (ENVARSUS XR) 1 mg Tb24 extended release tablet Take 1 tablet (1 mg total) by mouth in the morning. Take in addition to 0.75 mg tablets for total daily dose of 2.5 mg daily. 30 tablet 0     No current facility-administered medications for this visit.        Changes to medications: Shailyn reports no changes at this time.    Medication list has been reviewed and updated in Epic: Yes    Allergies   Allergen Reactions    Latex Swelling and Rash     Discoloration of skin.   Discoloration of skin.     Hydrocodone Itching    Hydrocodone-Acetaminophen Itching       Changes to allergies: No    Allergies have been reviewed and updated in Epic: Yes    SPECIALTY MEDICATION ADHERENCE     Mycophenolate 180mg   : 7 days of medicine on hand       Medication Adherence    Patient reported X missed doses in the last month: 0  Specialty Medication: mycophenolate 180 MG EC tablet (MYFORTIC)  Patient is on additional specialty medications: No  Adherence tools used: patient uses a pill box to manage medications          Specialty medication(s) dose(s) confirmed: Regimen is correct and unchanged.  Are there any concerns with adherence? No    Adherence counseling provided? Not needed    CLINICAL MANAGEMENT AND INTERVENTION      Clinical Benefit Assessment:    Do you feel the medicine is effective or helping your condition? Yes    Clinical Benefit counseling provided? Not needed    Adverse Effects Assessment:    Are you experiencing any side effects? No    Are you experiencing difficulty administering your medicine? No    Quality of Life Assessment:    Quality of Life    Rheumatology  Oncology  Dermatology  Cystic Fibrosis          How many days over the past month did your transplant  keep you from your normal activities? For example, brushing your teeth or getting up in the morning. 0    Have you discussed this with your provider? Not needed    Acute Infection Status:    Acute infections noted within Epic:  No active infections    Patient reported infection: None    Therapy Appropriateness:    Is therapy appropriate based on current medication list, adverse reactions, adherence, clinical benefit and progress toward achieving therapeutic goals? Yes, therapy is appropriate and should be continued     Clinical Intervention:    Was an intervention completed as part of this clinical assessment? No    DISEASE/MEDICATION-SPECIFIC INFORMATION      N/A    Solid Organ Transplant: Not Applicable    PATIENT SPECIFIC NEEDS     Does the patient have any physical, cognitive, or cultural barriers? No    Is the patient high risk? Yes, patient is taking a REMS drug. Medication is dispensed in compliance with REMS program    Does the patient require physician intervention or other additional services (i.e., nutrition, smoking cessation, social work)? No    Does the patient have an additional or emergency contact listed in their chart?  yes    SOCIAL DETERMINANTS OF HEALTH     At the [facility], we have learned that life circumstances - like trouble affording food, housing, utilities, or transportation can affect the health of many of our patients.   That is why we wanted to ask: are you currently experiencing any life circumstances that are negatively impacting your health and/or quality of life? Patient declined to answer    Social Drivers of Health     Food Insecurity: No Food Insecurity (11/20/2022)    Received from [facility]    Hunger Vital Sign     Worried About Running Out of Food in the Last Year: Never true     Ran Out of Food in the Last Year: Never true   Tobacco Use: Low Risk  (12/21/2023)    Received from [facility]    Patient History     Smoking Tobacco Use: Never     Smokeless Tobacco Use: Never     Passive Exposure: Not on file   Transportation Needs: No Transportation Needs (11/20/2022)    Received from [facility]    [facility] - Transportation     Lack of Transportation (Medical): No     Lack of Transportation (Non-Medical): No   Alcohol Use: Not At Risk (10/25/2022)    Alcohol Use     How often do you have a drink containing alcohol?: Never How many drinks containing alcohol do you have on a typical day when you are drinking?: Not on file  How often do you have 5 or more drinks on one occasion?: Never   Housing: Not on file   Physical Activity: Sufficiently Active (11/20/2022)    Received from [facility]    Exercise Vital Sign     Days of Exercise per Week: 7 days     Minutes of Exercise per Session: 70 min   Utilities: Not At Risk (11/20/2022)    Received from [facility]    [facility] Utilities     Threatened with loss of utilities: No   Stress: No Stress Concern Present (11/20/2022)    Received from [facility]    [facility] - Occupational Stress Questionnaire     Feeling of Stress : Only a little   Interpersonal Safety: Not on file   Substance Use: Not on file (10/26/2023)   Intimate Partner Violence: Not At Risk (11/20/2022)    Received from [facility]    Humiliation, Afraid, Rape, and Kick questionnaire     Fear of Current or Ex-Partner: No     Emotionally Abused: No     Physically Abused: No     Sexually Abused: No   Social Connections: Moderately Isolated (11/20/2022)    Received from [facility]    Social Connection and Isolation Panel [NHANES]     Frequency of Communication with Friends and Family: More than three times a week     Frequency of Social Gatherings with Friends and Family: Once a week     Attends Religious Services: More than 4 times per year     Active Member of [employer] or Organizations: No     Attends Banker Meetings: Never     Marital Status: Widowed   Physicist, medical Strain: Low Risk  (11/20/2022)    Received from [facility]    Overall Financial Resource Strain (CARDIA)     Difficulty of Paying Living Expenses: Not hard at all   Health Literacy: Not on file   Internet Connectivity: No Internet connectivity concern identified (10/25/2022)    Internet Connectivity     Do you have access to internet services: Yes     How do you connect to the internet: Personal Device at home     Is your internet connection strong enough for you to watch video on your device without major problems?: Yes     Do you have enough data to get through the month?: Yes     Does at least one of the devices have a camera that you can use for video chat?: Yes       Would you be willing to receive help with any of the needs that you have identified today? Not applicable       SHIPPING     Specialty Medication(s) to be Shipped:   Transplant: mycophenolic acid 180mg     Other medication(s) to be shipped: No additional medications requested for fill at this time  Patient declined ALL other refills at this time     Changes to insurance: No    Cost and Payment: Patient has a copay of $18.99. They are aware and have authorized the pharmacy to charge the credit card on file.    Delivery Scheduled: Yes, Expected medication delivery date: 03/03/2024.     Medication will be delivered via UPS to the confirmed prescription address in [facility].    The patient will receive a drug information handout  for each medication shipped and additional FDA Medication Guides as required.  Verified that patient has previously received a Conservation officer, historic buildings and a Surveyor, mining.    The patient or caregiver noted above participated in the development of this care plan and knows that they can request review of or adjustments to the care plan at any time.      All of the patient's questions and concerns have been addressed.    Christine Cozier, PharmD   [facility] Specialty Pharmacist

## 2024-02-29 MED FILL — MYCOPHENOLATE SODIUM 180 MG TABLET,DELAYED RELEASE: ORAL | 30 days supply | Qty: 120 | Fill #4

## 2024-03-06 DIAGNOSIS — R030 Elevated blood-pressure reading, without diagnosis of hypertension: Secondary | ICD-10-CM | POA: Diagnosis not present

## 2024-03-06 DIAGNOSIS — G4730 Sleep apnea, unspecified: Secondary | ICD-10-CM | POA: Diagnosis not present

## 2024-03-06 DIAGNOSIS — Z79899 Other long term (current) drug therapy: Secondary | ICD-10-CM | POA: Diagnosis not present

## 2024-03-06 DIAGNOSIS — M545 Low back pain, unspecified: Secondary | ICD-10-CM | POA: Diagnosis not present

## 2024-03-17 DIAGNOSIS — R748 Abnormal levels of other serum enzymes: Principal | ICD-10-CM

## 2024-03-17 DIAGNOSIS — Z940 Kidney transplant status: Principal | ICD-10-CM

## 2024-03-18 DIAGNOSIS — R8279 Other abnormal findings on microbiological examination of urine: Principal | ICD-10-CM

## 2024-03-18 DIAGNOSIS — R799 Abnormal finding of blood chemistry, unspecified: Principal | ICD-10-CM

## 2024-03-18 DIAGNOSIS — Z940 Kidney transplant status: Principal | ICD-10-CM

## 2024-03-18 DIAGNOSIS — R748 Abnormal levels of other serum enzymes: Principal | ICD-10-CM

## 2024-03-19 DIAGNOSIS — K219 Gastro-esophageal reflux disease without esophagitis: Secondary | ICD-10-CM | POA: Diagnosis not present

## 2024-03-19 DIAGNOSIS — E785 Hyperlipidemia, unspecified: Secondary | ICD-10-CM | POA: Diagnosis not present

## 2024-03-26 ENCOUNTER — Ambulatory Visit: Admit: 2024-03-26 | Discharge: 2024-03-27 | Payer: Medicare (Managed Care)

## 2024-03-26 ENCOUNTER — Ambulatory Visit: Admit: 2024-03-26 | Discharge: 2024-03-27 | Payer: Medicare (Managed Care) | Attending: Nephrology | Primary: Nephrology

## 2024-03-26 DIAGNOSIS — I10 Essential (primary) hypertension: Secondary | ICD-10-CM | POA: Diagnosis not present

## 2024-03-26 DIAGNOSIS — R799 Abnormal finding of blood chemistry, unspecified: Secondary | ICD-10-CM | POA: Diagnosis not present

## 2024-03-26 DIAGNOSIS — D849 Immunodeficiency, unspecified: Secondary | ICD-10-CM | POA: Diagnosis not present

## 2024-03-26 DIAGNOSIS — N042 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, unspecified: Secondary | ICD-10-CM | POA: Diagnosis not present

## 2024-03-26 DIAGNOSIS — Z940 Kidney transplant status: Secondary | ICD-10-CM | POA: Diagnosis not present

## 2024-03-26 DIAGNOSIS — R8279 Other abnormal findings on microbiological examination of urine: Secondary | ICD-10-CM | POA: Diagnosis not present

## 2024-03-26 DIAGNOSIS — Z7960 Long term (current) use of unspecified immunomodulators and immunosuppressants: Secondary | ICD-10-CM | POA: Diagnosis not present

## 2024-03-26 DIAGNOSIS — R748 Abnormal levels of other serum enzymes: Secondary | ICD-10-CM | POA: Diagnosis not present

## 2024-03-26 DIAGNOSIS — Z944 Liver transplant status: Secondary | ICD-10-CM | POA: Diagnosis not present

## 2024-03-26 DIAGNOSIS — Z1159 Encounter for screening for other viral diseases: Secondary | ICD-10-CM | POA: Diagnosis not present

## 2024-03-26 DIAGNOSIS — I5032 Chronic diastolic (congestive) heart failure: Secondary | ICD-10-CM | POA: Diagnosis not present

## 2024-03-26 DIAGNOSIS — G4739 Other sleep apnea: Secondary | ICD-10-CM | POA: Diagnosis not present

## 2024-03-26 LAB — CBC W/ AUTO DIFF
BASOPHILS ABSOLUTE COUNT: 0.1 10*9/L (ref 0.0–0.1)
BASOPHILS RELATIVE PERCENT: 1.4 %
EOSINOPHILS ABSOLUTE COUNT: 0.2 10*9/L (ref 0.0–0.5)
EOSINOPHILS RELATIVE PERCENT: 3.6 %
HEMATOCRIT: 40.3 % (ref 34.0–44.0)
HEMOGLOBIN: 13.7 g/dL (ref 11.3–14.9)
LYMPHOCYTES ABSOLUTE COUNT: 1.6 10*9/L (ref 1.1–3.6)
LYMPHOCYTES RELATIVE PERCENT: 25.8 %
MEAN CORPUSCULAR HEMOGLOBIN CONC: 34 g/dL (ref 32.0–36.0)
MEAN CORPUSCULAR HEMOGLOBIN: 29.5 pg (ref 25.9–32.4)
MEAN CORPUSCULAR VOLUME: 86.6 fL (ref 77.6–95.7)
MEAN PLATELET VOLUME: 9.6 fL (ref 6.8–10.7)
MONOCYTES ABSOLUTE COUNT: 0.7 10*9/L (ref 0.3–0.8)
MONOCYTES RELATIVE PERCENT: 11.4 %
NEUTROPHILS ABSOLUTE COUNT: 3.5 10*9/L (ref 1.8–7.8)
NEUTROPHILS RELATIVE PERCENT: 57.8 %
PLATELET COUNT: 235 10*9/L (ref 150–450)
RED BLOOD CELL COUNT: 4.65 10*12/L (ref 3.95–5.13)
RED CELL DISTRIBUTION WIDTH: 14.6 % (ref 12.2–15.2)
WBC ADJUSTED: 6 10*9/L (ref 3.6–11.2)

## 2024-03-26 LAB — ALBUMIN / CREATININE URINE RATIO
ALBUMIN QUANT URINE: 6.2 mg/dL
ALBUMIN/CREATININE RATIO: 65.7 ug/mg — ABNORMAL HIGH (ref 0.0–30.0)
CREATININE, URINE: 94.4 mg/dL

## 2024-03-26 LAB — COMPREHENSIVE METABOLIC PANEL
ALBUMIN: 4 g/dL (ref 3.4–5.0)
ALKALINE PHOSPHATASE: 67 U/L (ref 46–116)
ALT (SGPT): 17 U/L (ref 10–49)
ANION GAP: 8 mmol/L (ref 5–14)
AST (SGOT): 19 U/L (ref <=34)
BILIRUBIN TOTAL: 0.4 mg/dL (ref 0.3–1.2)
BLOOD UREA NITROGEN: 16 mg/dL (ref 9–23)
BUN / CREAT RATIO: 29
CALCIUM: 9.3 mg/dL (ref 8.7–10.4)
CHLORIDE: 105 mmol/L (ref 98–107)
CO2: 29.8 mmol/L (ref 20.0–31.0)
CREATININE: 0.56 mg/dL (ref 0.55–1.02)
EGFR CKD-EPI (2021) FEMALE: >90 mL/min/1.73m2 (ref >=60)
GLUCOSE RANDOM: 98 mg/dL (ref 70–179)
POTASSIUM: 4 mmol/L (ref 3.4–4.8)
PROTEIN TOTAL: 7.2 g/dL (ref 5.7–8.2)
SODIUM: 143 mmol/L (ref 135–145)

## 2024-03-26 LAB — URINALYSIS WITH MICROSCOPY
BILIRUBIN UA: NEGATIVE
BLOOD UA: NEGATIVE
GLUCOSE UA: NEGATIVE
KETONES UA: NEGATIVE
NITRITE UA: NEGATIVE
PH UA: 5.5 (ref 5.0–9.0)
PROTEIN UA: NEGATIVE
RBC UA: 3 /HPF (ref <=4)
SPECIFIC GRAVITY UA: 1.02 (ref 1.003–1.030)
SQUAMOUS EPITHELIAL: 4 /HPF (ref 0–5)
UROBILINOGEN UA: <2
WBC UA: 4 /HPF (ref 0–5)

## 2024-03-26 LAB — PROTEIN / CREATININE RATIO, URINE
CREATININE, URINE: 94.4 mg/dL
PROTEIN URINE: 18.7 mg/dL
PROTEIN/CREAT RATIO, URINE: 0.198

## 2024-03-26 LAB — LIPID PANEL
CHOLESTEROL: 222 mg/dL — ABNORMAL HIGH (ref <200)
HDL CHOLESTEROL: 58 mg/dL (ref >50)
LDL CHOLESTEROL CALCULATED: 143 mg/dL — ABNORMAL HIGH (ref <100)
NON-HDL CHOLESTEROL: 164 mg/dL — ABNORMAL HIGH (ref <130)
TRIGLYCERIDES: 120 mg/dL (ref <150)

## 2024-03-26 LAB — FERRITIN: FERRITIN: 105.6 ng/mL (ref 7.3–270.7)

## 2024-03-26 LAB — GAMMA GT: GAMMA GLUTAMYL TRANSFERASE: 41 U/L — ABNORMAL HIGH (ref 0–38)

## 2024-03-26 LAB — MAGNESIUM: MAGNESIUM: 1.5 mg/dL — ABNORMAL LOW (ref 1.6–2.6)

## 2024-03-26 LAB — TACROLIMUS LEVEL, TROUGH: TACROLIMUS, TROUGH: 5.8 ng/mL (ref 5.0–15.0)

## 2024-03-26 LAB — HEMOGLOBIN A1C
ESTIMATED AVERAGE GLUCOSE: 114 mg/dL
HEMOGLOBIN A1C: 5.6 % (ref 4.8–5.6)

## 2024-03-26 LAB — HEPATITIS C RNA, QUANTITATIVE, PCR: HCV RNA: NOT DETECTED

## 2024-03-26 LAB — PARATHYROID HORMONE (PTH): PARATHYROID HORMONE INTACT: 125.9 pg/mL — ABNORMAL HIGH (ref 18.5–88.1)

## 2024-03-26 LAB — BK VIRUS QUANTITATIVE PCR, BLOOD: BK BLOOD RESULT: NOT DETECTED

## 2024-03-26 LAB — CMV DNA, QUANTITATIVE, PCR: CMV VIRAL LD: NOT DETECTED

## 2024-03-26 LAB — IRON & TIBC
IRON SATURATION: 20 % (ref 20–55)
IRON: 58 ug/dL (ref 50–170)
TOTAL IRON BINDING CAPACITY: 294 ug/dL (ref 250–425)

## 2024-03-26 LAB — EBV QUANTITATIVE PCR, BLOOD: EBV VIRAL LOAD RESULT: NOT DETECTED

## 2024-03-26 LAB — BILIRUBIN, DIRECT: BILIRUBIN DIRECT: 0.1 mg/dL (ref 0.00–0.30)

## 2024-03-26 LAB — PHOSPHORUS: PHOSPHORUS: 3.2 mg/dL (ref 2.4–5.1)

## 2024-03-26 NOTE — Unmapped (Signed)
 Transplant Nephrology Clinic Visit    Assessment and Plan  Michelle Travis is a 68 y.o. 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 She has recurrent PLA2R positive membranous nephropathy and is seen for follow up of her transplant and associated medical problems.     # Status post kidney transplant   # Membranous nephropathy recurrence.  - Kidney biopsy 03/11/21 with PLA2R positive membranous nephropathy  - Initial treatment included Rituximab 1 gm on 03/23/21 and 1 gm on 04/07/21 and 125 mg solumedrol x 2 given with the doses of Rituximab.   - Recurrence of nephrotic range proteinuria prompted treatment with Rituximab 1 gm 02/07/22, 03/07/22, 07/20/22, 01/19/23, 08/30/23, 02/27/24.    - UPC peaked at 5.02 on 03/23/21 improved to 0.623 on 07/20/21 then peaked again to 8.66 on 02/07/22. With maintenance dosing rituximab UPC and UACR continue to improve with values today of 0.198 and 65.7 respectively.    - Serum creatinine is stable at 0.56 mg/dL (baseline < 0.8 mg/dL)  - DSA screen historically negative  - Will continue every 6 months rituximab.   - Patient at increased risk of DVT in view of membranous nephropathy recurrence. With normalization of UPC and serum albumin this risk is now much reduced. Will continue aspirin.     # S/P Liver transplant    - LFTs normal   - She will continue to follow with the liver transplant team.     # Immunosuppression Management   - Tacrolimus level is 5.8 (target 3-6)  - Continue Envarsus 2.5 mg daily   - Continue Myfortic 360 mg BID  - Continue rituximab every 6 months    # GI bleed, 09/2022, diverticular origin  # Iron Deficiency anemia, resolved  - Hospitalized 10/25/22-10/27/22 with rectal bleeding. Colonoscopy 10/26/22 with sigmoid, hepatic flexure, and ascending colon diverticulosis. Repeat colonoscopy 01/24/23 with similar results  - Denies recurrence of bleeding   - s/p feraheme 02/16/23, 02/28/23 and 03/12/23  - Iron saturation 20% today, Hemoglobin 13.7    # BP Management  - BP 120/89  - Currently on no antihypertensive therapy.    # Lipid Management  - Cholesterol 222, LDL 143    # Osteoartritis, knees and hips  # S/P right total hip replacement, 11/2021  -  We previously suggested that TKR be delayed until she had a response to membranous recurrence treatment. This was predominantly related to concern about post-op DVT.   - With improvement in proteinuria and normal serum albumin level she can once again engage her ortho team regarding timing for possible knee or hip surgery. Pt reports cost may be prohibitive but advised her again that proceeding with surgeries now would not represent excessive risk.     History of IgG lambda monoclonal gammopathy.   - SPEP with immunofixation 03/19/20 and 01/12/23 revealed no monoclonal protein. Kidney biopsy revealed no evidence of MGRS.    History of sleep apnea.   - She denies daytime drowsiness.   - Continue CPAP    Immunizations.  -  Prevnar-20 07/20/21.  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 S/p wedge excision of her right helical rim lesion on 11/13/23 with clear margin and no evidence  of residual cancer.   - Follow up with Michelle Grout, MD     Cancer screening.   - Mammography 06/01/23 negative, has remote history of breast cancer.  - PAP smear 11/20/22 negative for malignancy.  - Colonoscopy 10/26/22 and 01/24/23 with diverticulosis.    Follow-up.   Transplant nephrology clinic 6 months  Hepatology clinic 04/28/24  Monthly labs including UPC  Rituximab 1 gm every 6 months, next dose scheduled for 08/27/24    History of Present Illness  Michelle Travis is a 68 y.o. 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  She was diagnosed with recurrent membranous nephropathy on  03/11/21 and was treated with 2 doses of Rituximab 1 gm on 03/23/21 and 04/07/21 with improvement in proteinuria. UPC then increased to 6.94 on 01/27/22 prompting repat doses of 1 gm rituximab on 02/07/22, 03/07/22, 07/20/22, 01/19/23, 08/30/23, and 02/27/24 with improvement in proteinuria.     Interval history  Since her last clinic visit on 11/15/23 she was diagnosed with influenza A on 12/21/23 and was treated with Tamiflu. She recovered from this illness without residual symptoms. She has otherwise done well and today  denies dysuria, allograft tenderness, headaches, tremors, nausea, vomiting, diarrhea, abdominal pain, hematemesis, or melena. She is wearing CPAP and denies daytime drowsiness. Home BP has been 120's/80's. She denies chest pain shortness of breath or edema.     She continues to have hip and knee pain and had a troachanteric injection on 01/08/24 for trochanteric bursitis.      Immunosuppression is presently Envarsus 2.5 mg daily and Myfortic 360 mg bid.      Last dose of Envarsus: 9:00 AM yesterday.    Transplant History:  1. 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 S/P kidney and liver transplants on 09/13/17 at [facility]. Kidney KDPI 26%. CMV D+/R+; EBV D-/R+, hep C Ab +/NAT- (recipient hep C Ab negative)  4. Complicated by rectus sheath hematoma  5. Delayed graft function requiring hemodialysis sessions following transplant (stopped dialysis 11/26/018).  6. Immunosuppression was ??? induction followed by tacrolimus and myfortic maintenance therapy.   7. Baseline creatinine <1.0 mg/dL.  8. Kidney biopsy 03/11/21 with recurrent PLA2R positive membranous nephropathy, treated with Rituximab 1 gm x 4 (03/23/21, 04/07/21, 02/07/22, 03/07/22)   9. Rotavirus infection 04/2022 with AKI secondary to volume depletion    Past Medical History:  1. Alcoholic cirrhosis, S/P TIPS  2. Membranous nephropathy, PLA2R positive biopsy 08/30/2016, treated solumedrol initially with little improvement then with Rituximab (10/2016 and 03/2017).   3. S/P kidney and liver transplants as stated above  4. History of breast cancer, in remission for 17 years, s/p lumpectomy.  5. History of IGG monoclonal gammopathy, IgG lambda. Kidney biopsy 08/30/2016 without MGRS.  6. S/p cerebral aneurysm repair.  7. S/P total knee replacement, right   8. Essential HTN prior to ESLD  9. History of diverticulitis  10. Recurrent pleural effusion, history of pneumonia x 3  11. Osteopenia by DEXA 06/14/2016  12. Hypoattenuating thyroid nodule noted on chest CT 11/11/2016  13. Sleep apnea    Review of Systems    All other systems are reviewed and are negative.    Medications    Current Outpatient Medications   Medication Sig Dispense Refill    acetaminophen (TYLENOL 8 HOUR) 650 MG CR tablet Take 1 tablet (650  mg total) by mouth every four (4) hours as needed for pain. 30 tablet 0    atorvastatin (LIPITOR) 10 MG tablet Take 1 tablet (10 mg total) by mouth daily. 90 tablet 3    buprenorphine (BELBUCA) 150 mcg buccal film Apply 1 Film to cheek at bedtime.      cholecalciferol, vitamin D3 25 mcg, 1,000 units,, 1,000 unit (25 mcg) tablet Take 1 tablet (25 mcg total) by mouth in the morning. 90 tablet 3    ENVARSUS XR 1 mg Tb24 extended release tablet Take 1 tablet (1 mg total) by mouth in the morning. Take with TWO 0.75 mg tablets (1.5 mg total) by mouth in the morning for a total of 2.5 mg. 90 tablet 3    mycophenolate (MYFORTIC) 180 MG EC tablet Take 2 tablets (360 mg total) by mouth two (2) times a day. 360 tablet 3    oxyCODONE (ROXICODONE) 10 mg immediate release tablet TAKE 1 (ONE) TABLET BY MOUTH FOUR TIMES DAILY, AS NEEDED      pantoprazole (PROTONIX) 40 MG tablet Take 1 tablet (40 mg total) by mouth daily. 90 tablet 3    tacrolimus (ENVARSUS XR) 0.75 mg Tb24 extended release tablet Take 2 tablets (1.5 mg total) by mouth in the morning. Take with ONE 1 mg tablet by mouth in the morning for a total of 2.5 mg. 180 tablet 3    tacrolimus (ENVARSUS XR) 0.75 mg Tb24 extended release tablet Take 2 tablets (1.5 mg total) by mouth in the morning. Take in addition to 1 mg tablet for total daily dose of 2.5 mg once daily. 60 tablet 0    tacrolimus (ENVARSUS XR) 1 mg Tb24 extended release tablet Take 1 tablet (1 mg total) by mouth in the morning. Take in addition to 0.75 mg tablets for total daily dose of 2.5 mg daily. 30 tablet 0     No current facility-administered medications for this visit.       Physical Exam    BP 120/89 (BP Site: L Arm, BP Position: Sitting, BP Cuff Size: Large)  - Pulse 54  - Temp 35.6 ??C (96 ??F) (Temporal)  - Ht 160 cm (5' 3)  - Wt 81.2 kg (179 lb)  - LMP  (LMP Unknown)  - BMI 31.71 kg/m??   General: Patient is a pleasant female in no apparent distress.  Eyes: Sclera anicteric.  Neck: Supple without LAD/JVD/bruits.  Lungs: Clear to auscultation bilaterally, no wheezes/rales/rhonchi.  Cardiovascular: Regular rate and rhythm without murmurs, rubs or gallops.  Abdomen: Soft, notender/nondistended. Positive bowel sounds. No hepatosplenomegaly, masses or bruits appreciated.   Extremities: no edema  Skin: Without rash  Neurological: Grossly nonfocal.  Psychiatric: Mood and affect appropriate.    Laboratory Results and Imaging Reviewed

## 2024-03-26 NOTE — Unmapped (Signed)
 Transplant Coordinator, Clinic Visit   Pt seen today by transplant nephrology for follow up, reviewed medications and symptoms.          03/26/24 0910   BP: 120/89   Pulse: 54   Temp: 35.6 ??C (96 ??F)   Weight: 81.2 kg (179 lb)   Height: 160 cm (5' 3)       Assessment  BP @ Home: WNL - 120s/80s at office visits  BG: N/A  HA/Dizziness/Lightheaded: No  Hand tremors: No  Numbness/tingling: No  Fevers/Chills/sweats: No  Chest Pain/SOB: No  N/V/Heartburn: No  Diarrhea/constipation: No  UTI symptoms: No  Swelling: No  Pain: 0  Incision/Drain/Foley: N/A    Good appetite; reports adequate hydration     Any new medications? No  Immunosuppressant last taken: 9 AM yesterday      Immunization status: UTD    Plan:  - Continue Ritux q 6 months

## 2024-03-26 NOTE — Unmapped (Signed)
 Lab results from 03/26/24 were reviewed. Liver labs stable, tac in goal. No changes recommended at this time. Pt repeats labs monthly for kidney txp.

## 2024-03-28 LAB — FSAB CLASS 2 ANTIBODY SPECIFICITY: HLA CL2 AB RESULT: NEGATIVE

## 2024-03-28 LAB — HLA DS POST TRANSPLANT
ANTI-DONOR HLA-A #1 MFI: 0 MFI
ANTI-DONOR HLA-A #2 MFI: 0 MFI
ANTI-DONOR HLA-B #1 MFI: 0 MFI
ANTI-DONOR HLA-B #2 MFI: 0 MFI
ANTI-DONOR HLA-C #1 MFI: 0 MFI
ANTI-DONOR HLA-DP AG #1 MFI: 182 MFI
ANTI-DONOR HLA-DQB #1 MFI: 0 MFI
ANTI-DONOR HLA-DQB #2 MFI: 0 MFI
ANTI-DONOR HLA-DR #1 MFI: 0 MFI
ANTI-DONOR HLA-DR #2 MFI: 0 MFI

## 2024-03-28 LAB — VITAMIN D 25 HYDROXY: VITAMIN D, TOTAL (25OH): 33.8 ng/mL (ref 20.0–80.0)

## 2024-03-28 LAB — FSAB CLASS 1 ANTIBODY SPECIFICITY: HLA CLASS 1 ANTIBODY RESULT: NEGATIVE

## 2024-03-30 DIAGNOSIS — N022 Recurrent and persistent hematuria with diffuse membranous glomerulonephritis: Principal | ICD-10-CM

## 2024-03-30 DIAGNOSIS — N048 Nephrotic syndrome with other morphologic changes: Principal | ICD-10-CM

## 2024-03-30 DIAGNOSIS — N042 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: Principal | ICD-10-CM

## 2024-03-30 DIAGNOSIS — Z940 Kidney transplant status: Principal | ICD-10-CM

## 2024-04-01 LAB — VITAMIN D 1,25 DIHYDROXY: VITAMIN D 1,25-DIHYDROXY: 42 pg/mL

## 2024-04-03 DIAGNOSIS — Z940 Kidney transplant status: Principal | ICD-10-CM

## 2024-04-03 DIAGNOSIS — Z944 Liver transplant status: Principal | ICD-10-CM

## 2024-04-04 DIAGNOSIS — G8929 Other chronic pain: Secondary | ICD-10-CM | POA: Diagnosis not present

## 2024-04-04 DIAGNOSIS — M545 Low back pain, unspecified: Secondary | ICD-10-CM | POA: Diagnosis not present

## 2024-04-04 DIAGNOSIS — M549 Dorsalgia, unspecified: Secondary | ICD-10-CM | POA: Diagnosis not present

## 2024-04-04 DIAGNOSIS — Z79899 Other long term (current) drug therapy: Secondary | ICD-10-CM | POA: Diagnosis not present

## 2024-04-04 DIAGNOSIS — R030 Elevated blood-pressure reading, without diagnosis of hypertension: Secondary | ICD-10-CM | POA: Diagnosis not present

## 2024-04-04 DIAGNOSIS — M542 Cervicalgia: Secondary | ICD-10-CM | POA: Diagnosis not present

## 2024-04-04 DIAGNOSIS — M25561 Pain in right knee: Secondary | ICD-10-CM | POA: Diagnosis not present

## 2024-04-04 DIAGNOSIS — M25562 Pain in left knee: Secondary | ICD-10-CM | POA: Diagnosis not present

## 2024-04-08 DIAGNOSIS — Z79899 Other long term (current) drug therapy: Secondary | ICD-10-CM | POA: Diagnosis not present

## 2024-04-11 DIAGNOSIS — M169 Osteoarthritis of hip, unspecified: Secondary | ICD-10-CM | POA: Diagnosis not present

## 2024-04-11 DIAGNOSIS — K219 Gastro-esophageal reflux disease without esophagitis: Secondary | ICD-10-CM | POA: Diagnosis not present

## 2024-04-11 DIAGNOSIS — Z940 Kidney transplant status: Secondary | ICD-10-CM | POA: Diagnosis not present

## 2024-04-11 DIAGNOSIS — Z944 Liver transplant status: Secondary | ICD-10-CM | POA: Diagnosis not present

## 2024-04-11 DIAGNOSIS — Z96652 Presence of left artificial knee joint: Secondary | ICD-10-CM | POA: Diagnosis not present

## 2024-04-11 DIAGNOSIS — E785 Hyperlipidemia, unspecified: Secondary | ICD-10-CM | POA: Diagnosis not present

## 2024-04-11 NOTE — Unmapped (Signed)
 The [facility] has made a second and final attempt to reach this patient to refill the following medication:mycophenolate 180 MG EC tablet (MYFORTIC).      We have left voicemails on the following phone numbers: [phone], have sent a MyChart message, and have sent a text message to the following phone numbers: [phone].    Dates contacted: 04/03/2024 and 04/11/2024  Last scheduled delivery: 02/29/2024    The patient may be at risk of non-compliance with this medication. The patient should call the [facility] at [phone]  Option 4, then Option 4: Infectious Disease, Transplant to refill medication.    Ceri Mayer [facility] and [facility]

## 2024-04-11 NOTE — Unmapped (Signed)
 [facility] Delivery [facility] Refill Coordination Note    Specialty Medication(s) to be Shipped:   Transplant: mycophenolate mofetil 180mg     Other medication(s) to be shipped: No additional medications requested for fill at this time     SHERL YZAGUIRRE, DOB: 1955-11-12  Phone: [phone] (home)       All above HIPAA information was verified with patient.     Was a Nurse, learning disability used for this call? No    Completed refill call assessment today to schedule patient's medication shipment from the [facility]  [phone]).  All relevant notes have been reviewed.     Specialty medication(s) and dose(s) confirmed: Regimen is correct and unchanged.   Changes to medications: Ariely reports no changes at this time.  Changes to insurance: No  New side effects reported not previously addressed with a pharmacist or physician: None reported  Questions for the pharmacist: No    Confirmed patient received a Conservation officer, historic buildings and a Surveyor, mining with first shipment. The patient will receive a drug information handout for each medication shipped and additional FDA Medication Guides as required.       DISEASE/MEDICATION-SPECIFIC INFORMATION        N/A    SPECIALTY MEDICATION ADHERENCE     Medication Adherence    Patient reported X missed doses in the last month: 0  Specialty Medication: Mycophenolate 180 MG  Patient is on additional specialty medications: No  Informant: patient  Adherence tools used: patient uses a pill box to manage medications     Were doses missed due to medication being on hold? No    Mycophenolate 180 mg: 5 days of medicine on hand       REFERRAL TO PHARMACIST     Referral to the pharmacist: Not needed      [facility]     Shipping address confirmed in Epic.     Cost and Payment: Patient has a copay of $18.99. They are aware and have authorized the pharmacy to charge the credit card on file.    Delivery Scheduled: Yes, Expected medication delivery date: 04/16/24.     Medication will be delivered via UPS to the prescription address in Epic [place].    Tiera Mensinger M Santer Torres   [place] [facility]  Specialty Technician

## 2024-04-14 ENCOUNTER — Other Ambulatory Visit (HOSPITAL_COMMUNITY): Payer: Self-pay | Admitting: Gerontology

## 2024-04-14 DIAGNOSIS — N959 Unspecified menopausal and perimenopausal disorder: Secondary | ICD-10-CM

## 2024-04-14 DIAGNOSIS — Z940 Kidney transplant status: Principal | ICD-10-CM

## 2024-04-14 DIAGNOSIS — R748 Abnormal levels of other serum enzymes: Principal | ICD-10-CM

## 2024-04-15 NOTE — Unmapped (Signed)
 Michelle Travis 's Mycophenolate  shipment will be delayed as a result of credit card ending in 94 declined     I have reached out to the patient  at [phone] and left a voicemail message.  We will wait for a call back from the patient to reschedule the delivery.  We have not confirmed the new delivery date.

## 2024-04-16 ENCOUNTER — Ambulatory Visit (HOSPITAL_COMMUNITY)
Admission: RE | Admit: 2024-04-16 | Discharge: 2024-04-16 | Disposition: A | Discharge status: Home / Self Care | Source: Ambulatory Visit | Attending: Gerontology | Admitting: Gerontology

## 2024-04-16 DIAGNOSIS — Z780 Asymptomatic menopausal state: Secondary | ICD-10-CM | POA: Diagnosis not present

## 2024-04-16 DIAGNOSIS — M85852 Other specified disorders of bone density and structure, left thigh: Secondary | ICD-10-CM | POA: Diagnosis not present

## 2024-04-16 DIAGNOSIS — N959 Unspecified menopausal and perimenopausal disorder: Secondary | ICD-10-CM | POA: Insufficient documentation

## 2024-04-18 NOTE — Unmapped (Signed)
 Michelle Travis 's Mycophenolate   shipment will be canceled as a result of credit card ending in [id number] declined     I have reached out to the patient  at [phone] and left a voicemail message.  We will not reschedule the medication and have removed this/these medication(s) from the work request.  We have canceled this work request.

## 2024-04-22 DIAGNOSIS — R748 Abnormal levels of other serum enzymes: Secondary | ICD-10-CM | POA: Diagnosis not present

## 2024-04-22 DIAGNOSIS — Z940 Kidney transplant status: Secondary | ICD-10-CM | POA: Diagnosis not present

## 2024-04-22 LAB — CBC W/ DIFFERENTIAL
BANDED NEUTROPHILS ABSOLUTE COUNT: 0 10*3/uL (ref 0.0–0.1)
BASOPHILS ABSOLUTE COUNT: 0.1 10*3/uL (ref 0.0–0.2)
BASOPHILS RELATIVE PERCENT: 1 %
EOSINOPHILS ABSOLUTE COUNT: 0.2 10*3/uL (ref 0.0–0.4)
EOSINOPHILS RELATIVE PERCENT: 3 %
HEMATOCRIT: 41.3 % (ref 34.0–46.6)
HEMOGLOBIN: 13.2 g/dL (ref 11.1–15.9)
IMMATURE GRANULOCYTES: 0 %
LYMPHOCYTES ABSOLUTE COUNT: 1.8 10*3/uL (ref 0.7–3.1)
LYMPHOCYTES RELATIVE PERCENT: 28 %
MEAN CORPUSCULAR HEMOGLOBIN CONC: 32 g/dL (ref 31.5–35.7)
MEAN CORPUSCULAR HEMOGLOBIN: 28.9 pg (ref 26.6–33.0)
MEAN CORPUSCULAR VOLUME: 91 fL (ref 79–97)
MONOCYTES ABSOLUTE COUNT: 0.8 10*3/uL (ref 0.1–0.9)
MONOCYTES RELATIVE PERCENT: 12 %
NEUTROPHILS ABSOLUTE COUNT: 3.6 10*3/uL (ref 1.4–7.0)
NEUTROPHILS RELATIVE PERCENT: 56 %
PLATELET COUNT: 250 10*3/uL (ref 150–450)
RED BLOOD CELL COUNT: 4.56 x10E6/uL (ref 3.77–5.28)
RED CELL DISTRIBUTION WIDTH: 13.2 % (ref 11.7–15.4)
WHITE BLOOD CELL COUNT: 6.4 10*3/uL (ref 3.4–10.8)

## 2024-04-23 DIAGNOSIS — Z944 Liver transplant status: Principal | ICD-10-CM

## 2024-04-23 DIAGNOSIS — Z940 Kidney transplant status: Principal | ICD-10-CM

## 2024-04-23 LAB — BILIRUBIN, DIRECT: BILIRUBIN DIRECT: 0.14 mg/dL (ref 0.00–0.40)

## 2024-04-23 LAB — GAMMA GT: GAMMA GLUTAMYL TRANSFERASE: 27 IU/L (ref 0–60)

## 2024-04-23 LAB — MAGNESIUM: MAGNESIUM: 1.5 mg/dL — ABNORMAL LOW (ref 1.6–2.3)

## 2024-04-23 LAB — COMPREHENSIVE METABOLIC PANEL
ALBUMIN: 4.4 g/dL (ref 3.9–4.9)
ALKALINE PHOSPHATASE: 62 IU/L (ref 44–121)
ALT (SGPT): 17 IU/L (ref 0–32)
AST (SGOT): 17 IU/L (ref 0–40)
BILIRUBIN TOTAL (MG/DL) IN SER/PLAS: 0.4 mg/dL (ref 0.0–1.2)
BLOOD UREA NITROGEN: 17 mg/dL (ref 8–27)
BUN / CREAT RATIO: 24 (ref 12–28)
CALCIUM: 9.4 mg/dL (ref 8.7–10.3)
CHLORIDE: 105 mmol/L (ref 96–106)
CO2: 22 mmol/L (ref 20–29)
CREATININE: 0.72 mg/dL (ref 0.57–1.00)
EGFR: 91 mL/min/{1.73_m2}
GLOBULIN, TOTAL: 2 g/dL (ref 1.5–4.5)
GLUCOSE: 102 mg/dL — ABNORMAL HIGH (ref 70–99)
POTASSIUM: 4.4 mmol/L (ref 3.5–5.2)
SODIUM: 144 mmol/L (ref 134–144)
TOTAL PROTEIN: 6.4 g/dL (ref 6.0–8.5)

## 2024-04-23 LAB — PHOSPHORUS: PHOSPHORUS, SERUM: 3.1 mg/dL (ref 3.0–4.3)

## 2024-04-23 MED FILL — MYCOPHENOLATE SODIUM 180 MG TABLET,DELAYED RELEASE: ORAL | 30 days supply | Qty: 120 | Fill #5

## 2024-04-23 NOTE — Unmapped (Addendum)
 This pharmacist was notified by a technician that this patient has reported that they've missed 2 doses of their mycophenolate .. I have reviewed the patient's medical record and message sent to clinic team for followup as they deem necessary      Approximate time spent: 0-5 minutes    Ronal FORBES Reusing, PharmD, Clinical Specialty Pharmacist  [facility]        [facility] Refill Coordination Note    Specialty Medication(s) to be Shipped:   Transplant: mycophenolate  mofetil 180 mg    Other medication(s) to be shipped: No additional medications requested for fill at this time     Michelle Travis, DOB: 1955/11/22  Phone: [phone] (home)       All above HIPAA information was verified with patient.     Was a Nurse, learning disability used for this call? No    Completed refill call assessment today to schedule patient's medication shipment from the [facility]  [phone]).  All relevant notes have been reviewed.     Specialty medication(s) and dose(s) confirmed: Regimen is correct and unchanged.   Changes to medications: Maiya reports no changes at this time.  Changes to insurance: No  New side effects reported not previously addressed with a pharmacist or physician: None reported  Questions for the pharmacist: No    Confirmed patient received a Conservation officer, historic buildings and a Surveyor, mining with first shipment. The patient will receive a drug information handout for each medication shipped and additional FDA Medication Guides as required.       DISEASE/MEDICATION-SPECIFIC INFORMATION        N/A    SPECIALTY MEDICATION ADHERENCE     Medication Adherence    Patient reported X missed doses in the last month: 1-2  Specialty Medication: mycophenolate  180 MG EC tablet (MYFORTIC )  Patient is on additional specialty medications: No  Adherence tools used: patient uses a pill box to manage medications              Were doses missed due to medication being on hold? No    mycophenolate  180 MG EC tablet (MYFORTIC )  0 days of medicine on hand       REFERRAL TO PHARMACIST     Referral to the pharmacist: Yes - routine compliance concerns. Patient has missed 1-3 doses of medication. Refills were scheduled and concern routed to pharmacist for evaluation.      SHIPPING     Shipping address confirmed in Epic.     Cost and Payment: Patient has a copay of $18.99 . They are aware and have authorized the pharmacy to charge the credit card on file.    Delivery Scheduled: Yes, Expected medication delivery date: 04/24/2024.     Medication will be delivered via UPS to the prescription address in Epic WAM.    [facility]el[facility]  Specialty Technician

## 2024-04-24 LAB — TACROLIMUS LEVEL: TACROLIMUS BLOOD: 6.2 ng/mL (ref 5.0–20.0)

## 2024-04-26 NOTE — Unmapped (Signed)
 [facility]  [facility]    Encounter Date: 04/28/2024    Reason for visit: Status post liver transplantation on 09/13/2017 (Liver), 09/13/2017 (Kidney) (6 years 7 months) for Alcoholic Cirrhosis, seen for follow up    Assessment/Plan:   Michelle Travis is a 68 y.o.  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 She received a HCV Ab+/NAT- organ and has not developed chronic HCV in the post-transplant period. She does have recurrent proteinuria in her graft.     1. Immunosuppression: Envarsus  2.5 mg daily, Myfortic  360 mg BID  -Tac goal 3-5  -Repeat labs q3 months, but will defer to nephrology team if they want to do it more often     2. 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 Chronic opioids for knee pain, interested in knee replacement, but on hold until her nephrotic syndrome is under more control.   -Oxycodone  10 mg QID + Belbuca  300 mcg film tab daily prescribed by Dr Lamar Saba      4. Metabolic syndrome     -HLD: Continue atorvastatin  10 mg daily  -HTN: No issues  -Insulin resistance: A1c 5.6% in May 2025     5. 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 On rituximab . Agree with ASA for thrombosis risk, would be okay with short-term anticoagulation after knee replacement if needed in the future     Preventative Health:   - Bone Health:  DEXA scan done 04/16/24 with osteopenia, continue vitamin D  and Ca supplementation  - Dermatology: This patient is at increased risk for the development of skin cancers due to immunosuppressant medications. We recommend yearly surveillance. The patient has been informed of the same. Advised to schedule appointment with Dermatologist  - CRC screening: Colonoscopy done [facility]/24/23 @[facility]  no polyps. Next screening ~5 years since she is immunosuppressed.   -Women's Health: Mammogram pending for August. Aged out of PAP smear.     Vaccinations: We recommend that patients have vaccinations to prevent various infections that can occur, especially in the setting of having underlying liver disease. The following vaccinations should be given:  -Hepatitis A: HAV IgG+  -Hepatitis B: Vaccinated   -Influenza (yearly): Due Fall 2025  -Pneumococcal: PPSV23  (2016), PCV13 (2019), Prevnar 20 (06/2021)  -Zoster: Shingrix    08/2018, 12/2019  -SARS-CoV-2: Due for annual booster this fall  -Tdap 05/2016  -RSV 12/2022    Patient seen and discussed with Dr. Maree. Return in about 1 year (around 04/28/2025).    Michelle CHARLENA Springs, MD  Gastroenterology & Hepatology Fellow  [employer]      Attending Attestation:   Doing very well, no issues. Still takes Rituximab  per nephrology for her return of MSGS and nephrotic syndrome. We will continue to monitor her liver annually.    It was medically necessary for me to see the patient because of the complexity of the liver disease.  My visit included an interview and examination of this patient.  I reviewed pertinent lab, radiographic and chart data.  I discussed the case with Dr. Springs and edited documentation associated with this visit. I agree with the assessment and plan as outlined.     Aureliano CORDOBA Maree, MD  Associate Professor of Medicine   [employer]  at [facility]  Division of Gastroenterology & Hepatology  p: [phone] - f: [phone]  [email or web address]  Subjective   History of Present Illness/Interval History  Accompanied by: N/A (unaccompanied)  History of Present Illness  Michelle Travis is a 68 year old female who presents for follow-up care after a transplant.    The patient was last seen on 04/11/23. In the interim she's done relatively well. Continues to follow [facility] closely for her return of nephrotic range proteinuria; continues to receive Rituximab .    She feels good overall. Biggest issue is discomfort at the back of her L knee. Denies swelling. Using a knee brace and a cane for stability. Pain is managed with Tylenol . Working for ortho, going to PT.    Med list reviewed: on mycophenolate  (Myfortic ) 360 mg twice daily, tacrolimus  (Envarsus ) 2.5 mg daily. She denies any new supplements or medications. No alcohol use or recreational drug use, including marijuana.    Labs 04/28/24 LFTs and iron  studies WNL. Tac level pending.  04/22/24 6.5.  DEXA 04/16/24 @[facility]  osteopenia. On daily vitamin D  1000. Also on calcium.  Colonoscopy 01/24/23 @[facility]  no polyps  Mammo scheduled for August.    Objective   Physical Exam   Vital Signs: BP 156/90  - Pulse 51  - Temp 36.4 ??C (97.5 ??F) (Oral)  - Ht 160 cm (5' 2.99)  - Wt 80.8 kg (178 lb 3.2 oz)  - LMP  (LMP Unknown)  - SpO2 100%  - BMI 31.57 kg/m??     Constitutional: She is in no apparent distress  Eyes: Anicteric sclerae  Cardiovascular: No peripheral edema  Gastrointestinal: Soft, nontender abdomen without hepatosplenomegaly, hernias, or masses  Neurologic: Awake, alert, and oriented to person, place, and time with normal speech and no asterixis    Explant Data 09/13/2017:  A: Liver, native, explantation  - Hepatic cirrhosis (liver weight 870 g), clinically alcoholic cirrhosis.  - PAS-D stain positive for abnormal intracytoplasmic PAS-D positive inclusions (See Comment).  - Reticulin and trichrome stains confirmatory highlighting extensive hepatic fibrosis and cirrhosis.  - Iron  stain negative for increased hepatocellular iron  deposition.  - Negative for malignancy including the examined resection margins.  - Minimal steatosis involving approximately <1% of liver parenchyma.    Lab Results   Component Value Date    Total Bilirubin 0.6 04/28/2024    Total Bilirubin 0.4 04/22/2024    AST 18 04/28/2024    AST 17 04/22/2024    ALT 16 04/28/2024    ALT 17 04/22/2024    Alkaline Phosphatase 57 04/28/2024    Alkaline Phosphatase 62 04/22/2024       Patient is taking immunosuppressive medications due to liver transplantation and requires monitoring of renal function for signs of toxicity  I personally spent 45 minutes face-to-face and non-face-to-face in the care of this patient, which includes all pre, intra, and post visit time on the date of service

## 2024-04-28 ENCOUNTER — Ambulatory Visit: Admit: 2024-04-28 | Discharge: 2024-04-29 | Payer: Medicare (Managed Care)

## 2024-04-28 DIAGNOSIS — R748 Abnormal levels of other serum enzymes: Secondary | ICD-10-CM | POA: Diagnosis not present

## 2024-04-28 DIAGNOSIS — R8279 Other abnormal findings on microbiological examination of urine: Secondary | ICD-10-CM | POA: Diagnosis not present

## 2024-04-28 DIAGNOSIS — R799 Abnormal finding of blood chemistry, unspecified: Secondary | ICD-10-CM | POA: Diagnosis not present

## 2024-04-28 DIAGNOSIS — Z944 Liver transplant status: Secondary | ICD-10-CM | POA: Diagnosis not present

## 2024-04-28 DIAGNOSIS — Z940 Kidney transplant status: Secondary | ICD-10-CM | POA: Diagnosis not present

## 2024-04-28 LAB — COMPREHENSIVE METABOLIC PANEL
ALBUMIN: 4.4 g/dL (ref 3.4–5.0)
ALKALINE PHOSPHATASE: 57 U/L (ref 46–116)
ALT (SGPT): 16 U/L (ref 10–49)
ANION GAP: 8 mmol/L (ref 5–14)
AST (SGOT): 18 U/L (ref <=34)
BILIRUBIN TOTAL: 0.6 mg/dL (ref 0.3–1.2)
BLOOD UREA NITROGEN: 15 mg/dL (ref 9–23)
BUN / CREAT RATIO: 23
CALCIUM: 9.4 mg/dL (ref 8.7–10.4)
CHLORIDE: 106 mmol/L (ref 98–107)
CO2: 28 mmol/L (ref 20.0–31.0)
CREATININE: 0.66 mg/dL (ref 0.55–1.02)
EGFR CKD-EPI (2021) FEMALE: >90 mL/min/1.73m2 (ref >=60)
GLUCOSE RANDOM: 104 mg/dL — ABNORMAL HIGH (ref 70–99)
POTASSIUM: 4.4 mmol/L (ref 3.5–5.1)
PROTEIN TOTAL: 7.2 g/dL (ref 5.7–8.2)
SODIUM: 142 mmol/L (ref 135–145)

## 2024-04-28 LAB — URINALYSIS WITH MICROSCOPY
BILIRUBIN UA: NEGATIVE
BLOOD UA: NEGATIVE
GLUCOSE UA: NEGATIVE
KETONES UA: NEGATIVE
NITRITE UA: NEGATIVE
PH UA: 6 (ref 5.0–9.0)
PROTEIN UA: NEGATIVE
RBC UA: 5 /HPF — ABNORMAL HIGH (ref <=4)
SPECIFIC GRAVITY UA: 1.019 (ref 1.003–1.030)
SQUAMOUS EPITHELIAL: 11 /HPF — ABNORMAL HIGH (ref 0–5)
UROBILINOGEN UA: <2
WBC UA: 17 /HPF — ABNORMAL HIGH (ref 0–5)

## 2024-04-28 LAB — PROTEIN / CREATININE RATIO, URINE
CREATININE, URINE: 84.5 mg/dL
PROTEIN URINE: 18.3 mg/dL
PROTEIN/CREAT RATIO, URINE: 0.217

## 2024-04-28 LAB — TACROLIMUS LEVEL, TROUGH: TACROLIMUS, TROUGH: 3.6 ng/mL — ABNORMAL LOW (ref 5.0–15.0)

## 2024-04-28 LAB — FERRITIN: FERRITIN: 85.4 ng/mL (ref 7.3–270.7)

## 2024-04-28 LAB — IRON & TIBC
IRON SATURATION: 64 % — ABNORMAL HIGH (ref 20–55)
IRON: 59 ug/dL (ref 50–170)
TOTAL IRON BINDING CAPACITY: 92 ug/dL — ABNORMAL LOW (ref 250–425)

## 2024-04-28 LAB — GAMMA GT: GAMMA GLUTAMYL TRANSFERASE: 36 U/L (ref 0–38)

## 2024-04-28 NOTE — Unmapped (Signed)
 Lab results from 04/28/24 were reviewed. Liver labs stable, tac in goal. No changes recommended at this time. Pt repeats labs monthly for kidney txp.

## 2024-04-28 NOTE — Unmapped (Signed)
-   Continue medications  - Continue vitamin D  and calcium for bone health

## 2024-04-28 NOTE — Unmapped (Signed)
 Urine was collected and sent to the lab.

## 2024-05-06 DIAGNOSIS — Z944 Liver transplant status: Secondary | ICD-10-CM | POA: Diagnosis not present

## 2024-05-06 DIAGNOSIS — M549 Dorsalgia, unspecified: Secondary | ICD-10-CM | POA: Diagnosis not present

## 2024-05-06 DIAGNOSIS — M545 Low back pain, unspecified: Secondary | ICD-10-CM | POA: Diagnosis not present

## 2024-05-06 DIAGNOSIS — Z79899 Other long term (current) drug therapy: Secondary | ICD-10-CM | POA: Diagnosis not present

## 2024-05-06 DIAGNOSIS — G4730 Sleep apnea, unspecified: Secondary | ICD-10-CM | POA: Diagnosis not present

## 2024-05-06 DIAGNOSIS — C50919 Malignant neoplasm of unspecified site of unspecified female breast: Secondary | ICD-10-CM | POA: Diagnosis not present

## 2024-05-06 DIAGNOSIS — J029 Acute pharyngitis, unspecified: Secondary | ICD-10-CM | POA: Diagnosis not present

## 2024-05-06 DIAGNOSIS — Z940 Kidney transplant status: Secondary | ICD-10-CM | POA: Diagnosis not present

## 2024-05-06 DIAGNOSIS — E785 Hyperlipidemia, unspecified: Secondary | ICD-10-CM | POA: Diagnosis not present

## 2024-05-08 ENCOUNTER — Other Ambulatory Visit (HOSPITAL_COMMUNITY): Payer: Self-pay | Admitting: Internal Medicine

## 2024-05-08 DIAGNOSIS — Z79899 Other long term (current) drug therapy: Secondary | ICD-10-CM | POA: Diagnosis not present

## 2024-05-08 DIAGNOSIS — Z1231 Encounter for screening mammogram for malignant neoplasm of breast: Secondary | ICD-10-CM

## 2024-05-11 DIAGNOSIS — N022 Recurrent and persistent hematuria with diffuse membranous glomerulonephritis: Principal | ICD-10-CM

## 2024-05-11 DIAGNOSIS — Z940 Kidney transplant status: Principal | ICD-10-CM

## 2024-05-11 DIAGNOSIS — N048 Nephrotic syndrome with other morphologic changes: Principal | ICD-10-CM

## 2024-05-11 DIAGNOSIS — N042 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: Principal | ICD-10-CM

## 2024-05-12 DIAGNOSIS — R748 Abnormal levels of other serum enzymes: Principal | ICD-10-CM

## 2024-05-12 DIAGNOSIS — Z940 Kidney transplant status: Principal | ICD-10-CM

## 2024-05-18 ENCOUNTER — Emergency Department (HOSPITAL_BASED_OUTPATIENT_CLINIC_OR_DEPARTMENT_OTHER)
Admission: EM | Admit: 2024-05-18 | Discharge: 2024-05-18 | Disposition: A | Discharge status: Home / Self Care | Attending: Emergency Medicine | Admitting: Emergency Medicine

## 2024-05-18 ENCOUNTER — Encounter (HOSPITAL_BASED_OUTPATIENT_CLINIC_OR_DEPARTMENT_OTHER): Payer: Self-pay

## 2024-05-18 ENCOUNTER — Other Ambulatory Visit: Payer: Self-pay

## 2024-05-18 DIAGNOSIS — Z944 Liver transplant status: Secondary | ICD-10-CM | POA: Diagnosis not present

## 2024-05-18 DIAGNOSIS — J029 Acute pharyngitis, unspecified: Secondary | ICD-10-CM | POA: Insufficient documentation

## 2024-05-18 DIAGNOSIS — Z7982 Long term (current) use of aspirin: Secondary | ICD-10-CM | POA: Diagnosis not present

## 2024-05-18 DIAGNOSIS — Z940 Kidney transplant status: Secondary | ICD-10-CM | POA: Insufficient documentation

## 2024-05-18 DIAGNOSIS — Z91040 Latex allergy status: Secondary | ICD-10-CM | POA: Insufficient documentation

## 2024-05-18 LAB — GROUP A STREP BY PCR: Group A Strep by PCR: NOT DETECTED

## 2024-05-18 MED ORDER — LIDOCAINE VISCOUS HCL 2 % MT SOLN: 15.0000 mL | Freq: Four times a day (QID) | OROMUCOSAL | 0 refills | Status: AC | PRN

## 2024-05-18 MED ORDER — LIDOCAINE VISCOUS HCL 2 % MT SOLN
15.0000 mL | Freq: Once | OROMUCOSAL | Status: AC
  Administered 2024-05-18: 15 mL via OROMUCOSAL
  Filled 2024-05-18: qty 15

## 2024-05-18 NOTE — ED Triage Notes (Signed)
 POV Sore throat for two weeks, pains with swallowing on the left throat side. Irradiates upwards to left ear.   Pt alert and oriented, pain 8 out 10 difficult to swallow pills.   Pt was on antibiotics last week for white patches.

## 2024-05-18 NOTE — ED Provider Notes (Signed)
 [facility] AT [facility]  Provider Note  CSN: [id number] Arrival date & time: 05/18/24 0022  History Chief Complaint  Patient presents with   Sore Throat    Savannah Jackson is a 68 y.o. female with history of liver/kidney transplant, compliant with rejection meds reports 2 weeks of sore throat, mostly on the left side and worse with swallowing. No fever. Pain radiates into her L ear. She saw her PCP last week and given Amoxil  for 'white patches' but not tested for strep. She reports no improvement.    Home Medications Prior to Admission medications   Medication Sig Start Date End Date Taking? Authorizing Provider  lidocaine  (XYLOCAINE ) 2 % solution Use as directed 15 mLs in the mouth or throat every 6 (six) hours as needed (sore throat). 05/18/24  Yes Roselyn Carlin NOVAK, MD  acetaminophen  (TYLENOL ) 325 MG tablet Take 650 mg by mouth every 6 (six) hours as needed for headache or mild pain.    [provider]  aspirin  81 MG chewable tablet Chew 1 tablet (81 mg total) by mouth 2 (two) times daily. Patient taking differently: Chew 81 mg by mouth daily. 12/10/21   Vernetta Lonni GRADE, MD  atorvastatin  (LIPITOR) 10 MG tablet Take 10 mg by mouth daily.  12/04/18   [provider]  benzonatate  (TESSALON ) 100 MG capsule Take 1 capsule (100 mg total) by mouth 3 (three) times daily as needed. 12/21/23   Silver Wonda LABOR, PA  Buprenorphine HCl 150 MCG FILM Take 150 mcg by mouth every other day. 10/17/21   [provider]  cholecalciferol  (VITAMIN D ) 1000 units tablet Take 1,000 Units by mouth daily.    [provider]  magic mouthwash (nystatin , lidocaine , diphenhydrAMINE , alum & mag hydroxide) suspension Swish and spit 10 mLs 3 (three) times daily. Use for the next week of until mouth ulcers improve 03/30/23   Doretha Folks, MD  methocarbamol  (ROBAXIN ) 500 MG tablet Take 1 tablet (500 mg total) by mouth every 6 (six) hours as  needed for muscle spasms. 12/10/21   Vernetta Lonni GRADE, MD  mycophenolate  (MYFORTIC ) 180 MG EC tablet Take 360 mg by mouth 2 (two) times daily.    [provider]  oseltamivir  (TAMIFLU ) 75 MG capsule Take 1 capsule (75 mg total) by mouth every 12 (twelve) hours. 12/21/23   Silver Wonda LABOR, PA  oxyCODONE  (OXY IR/ROXICODONE ) 5 MG immediate release tablet Take 1-2 tablets (5-10 mg total) by mouth every 4 (four) hours as needed for moderate pain (pain score 4-6). 12/10/21   Vernetta Lonni GRADE, MD  Oxycodone  HCl 10 MG TABS Take 10 mg by mouth 5 (five) times daily as needed (pain). 01/12/23   [provider]  pantoprazole  (PROTONIX ) 40 MG tablet Take 40 mg by mouth daily.    [provider]  tacrolimus  ER (ENVARSUS  XR) 0.75 MG TB24 tablet Take 1.5 mg by mouth daily before breakfast.    [provider]  tacrolimus  ER (ENVARSUS  XR) 1 MG TB24 Take 1 mg by mouth daily before breakfast.    [provider]     Allergies    Latex and Vicodin [hydrocodone -acetaminophen ]   Review of Systems   Review of Systems Please see HPI for pertinent positives and negatives  Physical Exam BP (!) 132/92 (BP Location: Right Arm)   Pulse 72   Temp 98.3 F (36.8 C) (Oral)   Resp 16   SpO2 99%   Physical Exam Vitals and nursing note reviewed.  Constitutional:      Appearance: Normal appearance.  HENT:     Head: Normocephalic and atraumatic.     Right Ear: Tympanic membrane normal.     Left Ear: Tympanic membrane normal.     Nose: Nose normal.     Mouth/Throat:     Mouth: Mucous membranes are moist. No oral lesions.     Tongue: No lesions.     Palate: No mass and lesions.     Pharynx: No oropharyngeal exudate or posterior oropharyngeal erythema.     Tonsils: No tonsillar exudate.  Eyes:     Extraocular Movements: Extraocular movements intact.     Conjunctiva/sclera: Conjunctivae normal.  Cardiovascular:     Rate and Rhythm: Normal rate.  Pulmonary:      Effort: Pulmonary effort is normal.     Breath sounds: Normal breath sounds.  Abdominal:     General: Abdomen is flat.     Palpations: Abdomen is soft.     Tenderness: There is no abdominal tenderness.  Musculoskeletal:        General: No swelling. Normal range of motion.     Cervical back: Neck supple.  Skin:    General: Skin is warm and dry.  Neurological:     General: No focal deficit present.     Mental Status: She is alert.  Psychiatric:        Mood and Affect: Mood normal.     ED Results / Procedures / Treatments   EKG None  Procedures Procedures  Medications Ordered in the ED Medications  lidocaine  (XYLOCAINE ) 2 % viscous mouth solution 15 mL (15 mLs Mouth/Throat Given 05/18/24 0043)    Initial Impression and Plan  Patient here for nonspecific sore throat. Her exam is normal without signs of tonsillar edema, exudate, thrush, etc. Will check strep for reassurance, but low likelihood of positive, especially given recent Amoxil  course. Viscous lidocaine  for comfort.   ED Course   Clinical Course as of 05/18/24 0128  [place] May 18, 2024  0126 Strep is negative. Patient reports some improvement with viscous lidocaine . Will send Rx as needed. Recommend outpatient ENT follow up for further evaluation including consideration of direct visualization. PCP follow up, RTED for any other concerns.   [CS]    Clinical Course User Index [CS] Roselyn Carlin NOVAK, MD     MDM Rules/Calculators/A&P Medical Decision Making Problems Addressed: Sore throat: acute illness or injury  Amount and/or Complexity of Data Reviewed Labs: ordered. Decision-making details documented in ED Course.  Risk Prescription drug management.     Final Clinical Impression(s) / ED Diagnoses Final diagnoses:  Sore throat    Rx / DC Orders ED Discharge Orders          Ordered    lidocaine  (XYLOCAINE ) 2 % solution  Every 6 hours PRN        05/18/24 0127             Roselyn Carlin NOVAK, MD 05/18/24 [phone]

## 2024-05-20 DIAGNOSIS — Z940 Kidney transplant status: Secondary | ICD-10-CM | POA: Diagnosis not present

## 2024-05-20 DIAGNOSIS — R748 Abnormal levels of other serum enzymes: Secondary | ICD-10-CM | POA: Diagnosis not present

## 2024-05-20 LAB — COMPREHENSIVE METABOLIC PANEL
ALBUMIN: 4.2 g/dL (ref 3.9–4.9)
ALKALINE PHOSPHATASE: 73 IU/L (ref 44–121)
ALT (SGPT): 21 IU/L (ref 0–32)
AST (SGOT): 25 IU/L (ref 0–40)
BILIRUBIN TOTAL (MG/DL) IN SER/PLAS: 0.3 mg/dL (ref 0.0–1.2)
BLOOD UREA NITROGEN: 11 mg/dL (ref 8–27)
BUN / CREAT RATIO: 14 (ref 12–28)
CALCIUM: 9.1 mg/dL (ref 8.7–10.3)
CHLORIDE: 105 mmol/L (ref 96–106)
CO2: 22 mmol/L (ref 20–29)
CREATININE: 0.76 mg/dL (ref 0.57–1.00)
EGFR: 85 mL/min/1.73
GLOBULIN, TOTAL: 2.5 g/dL (ref 1.5–4.5)
GLUCOSE: 96 mg/dL (ref 70–99)
POTASSIUM: 4.6 mmol/L (ref 3.5–5.2)
SODIUM: 143 mmol/L (ref 134–144)
TOTAL PROTEIN: 6.7 g/dL (ref 6.0–8.5)

## 2024-05-20 LAB — CBC W/ DIFFERENTIAL
BANDED NEUTROPHILS ABSOLUTE COUNT: 0 x10E3/uL (ref 0.0–0.1)
BASOPHILS ABSOLUTE COUNT: 0 x10E3/uL (ref 0.0–0.2)
BASOPHILS RELATIVE PERCENT: 1 %
EOSINOPHILS ABSOLUTE COUNT: 0.2 x10E3/uL (ref 0.0–0.4)
EOSINOPHILS RELATIVE PERCENT: 3 %
HEMATOCRIT: 42.3 % (ref 34.0–46.6)
HEMOGLOBIN: 13.2 g/dL (ref 11.1–15.9)
IMMATURE GRANULOCYTES: 0 %
LYMPHOCYTES ABSOLUTE COUNT: 1.5 x10E3/uL (ref 0.7–3.1)
LYMPHOCYTES RELATIVE PERCENT: 24 %
MEAN CORPUSCULAR HEMOGLOBIN CONC: 31.2 g/dL — ABNORMAL LOW (ref 31.5–35.7)
MEAN CORPUSCULAR HEMOGLOBIN: 29 pg (ref 26.6–33.0)
MEAN CORPUSCULAR VOLUME: 93 fL (ref 79–97)
MONOCYTES ABSOLUTE COUNT: 0.7 x10E3/uL (ref 0.1–0.9)
MONOCYTES RELATIVE PERCENT: 12 %
NEUTROPHILS ABSOLUTE COUNT: 3.7 x10E3/uL (ref 1.4–7.0)
NEUTROPHILS RELATIVE PERCENT: 59 %
PLATELET COUNT: 295 x10E3/uL (ref 150–450)
RED BLOOD CELL COUNT: 4.55 x10E6/uL (ref 3.77–5.28)
RED CELL DISTRIBUTION WIDTH: 12.6 % (ref 11.7–15.4)
WHITE BLOOD CELL COUNT: 6.1 x10E3/uL (ref 3.4–10.8)

## 2024-05-21 LAB — PHOSPHORUS: PHOSPHORUS, SERUM: 2.3 mg/dL — ABNORMAL LOW (ref 3.0–4.3)

## 2024-05-21 LAB — BILIRUBIN, DIRECT: BILIRUBIN DIRECT: 0.11 mg/dL (ref 0.00–0.40)

## 2024-05-21 LAB — GAMMA GT: GAMMA GLUTAMYL TRANSFERASE: 41 IU/L (ref 0–60)

## 2024-05-21 LAB — MAGNESIUM: MAGNESIUM: 1.6 mg/dL (ref 1.6–2.3)

## 2024-05-22 DIAGNOSIS — Z796 Long-term use of immunosuppressant medication: Principal | ICD-10-CM

## 2024-05-22 DIAGNOSIS — Z940 Kidney transplant status: Principal | ICD-10-CM

## 2024-05-22 DIAGNOSIS — Z944 Liver transplant status: Principal | ICD-10-CM

## 2024-05-22 LAB — TACROLIMUS LEVEL: TACROLIMUS BLOOD: 4.8 ng/mL — ABNORMAL LOW (ref 5.0–20.0)

## 2024-05-22 MED ORDER — MYCOPHENOLATE SODIUM 180 MG TABLET,DELAYED RELEASE
ORAL_TABLET | Freq: Two times a day (BID) | ORAL | 3 refills | 90.00000 days | Status: CP
Start: 2024-05-22 — End: 2025-05-22

## 2024-05-22 NOTE — Unmapped (Signed)
 Lab results from 05/20/24 were reviewed. No changes recommended at this time by the liver team. Pt to repeat labs in one month.    Messaged Pt in MyChart.

## 2024-05-22 NOTE — Unmapped (Signed)
 Patient called and stated that she is out of Myfortic  and needs a refill.

## 2024-05-23 DIAGNOSIS — Z940 Kidney transplant status: Principal | ICD-10-CM

## 2024-05-23 DIAGNOSIS — Z944 Liver transplant status: Principal | ICD-10-CM

## 2024-05-23 MED ORDER — MYCOPHENOLATE SODIUM 180 MG TABLET,DELAYED RELEASE
ORAL_TABLET | Freq: Two times a day (BID) | ORAL | 0 refills | 5.00000 days | Status: CP
Start: 2024-05-23 — End: 2024-05-28

## 2024-05-23 MED ORDER — MYCOPHENOLATE MOFETIL 500 MG TABLET
ORAL_TABLET | Freq: Two times a day (BID) | ORAL | 0 refills | 5.00000 days | Status: CP
Start: 2024-05-23 — End: 2024-05-28
  Filled 2024-05-26: qty 120, 30d supply, fill #0

## 2024-05-23 NOTE — Unmapped (Signed)
 Tried to return call and left VM regarding being out of mycophenolate . [facility] will deliver on Tuesday. Local [facility] only caries Cellcept , per Ronal Cedars, CPP - take Cellcept  500 mg BID until she receives Myfortic  from [facility], short rx sent it.

## 2024-05-23 NOTE — Unmapped (Signed)
 Pt paged on call TNC, Primary TNC placed call to Pt. Pt out of Myfortic . Per chart, Myfortic  was scripted to [facility] yesterday by kidney txp team. TNC messaged [facility] to please call Pt for shipment. Pt admits she has not been answering her cell due to spam calls. TNC educated Pt on answering her cell, Pt verbalized understanding.

## 2024-05-23 NOTE — Unmapped (Signed)
 [facility] Refill Coordination Note    Specialty Medication(s) to be Shipped:   Transplant: mycophenolate  mofetil 180mg     Other medication(s) to be shipped: No additional medications requested for fill at this time     Michelle [facility], DOB: 1956/01/24  Phone: [phone] (home)       All above HIPAA information was verified with patient.     Was a Nurse, learning disability used for this call? No    Completed refill call assessment today to schedule patient's medication shipment from the [facility]  [phone]).  All relevant notes have been reviewed.     Specialty medication(s) and dose(s) confirmed: Regimen is correct and unchanged.   Changes to medications: Bernetha reports no changes at this time.  Changes to insurance: No  New side effects reported not previously addressed with a pharmacist or physician: None reported  Questions for the pharmacist: No    Confirmed patient received a Conservation officer, historic buildings and a Surveyor, mining with first shipment. The patient will receive a drug information handout for each medication shipped and additional FDA Medication Guides as required.       DISEASE/MEDICATION-SPECIFIC INFORMATION        N/A    SPECIALTY MEDICATION ADHERENCE     Medication Adherence    Specialty Medication: mycophenolate  180 MG EC tablet (MYFORTIC )  Patient is on additional specialty medications: No  Adherence tools used: patient uses a pill box to manage medications              Were doses missed due to medication being on hold? No      mycophenolate  180 MG EC tablet (MYFORTIC ): 0 days of medicine on hand     REFERRAL TO PHARMACIST     Referral to the pharmacist: Yes - routine compliance concerns. Patient has missed 1-3 doses of medication. Refills were scheduled and concern routed to pharmacist for evaluation.      SHIPPING     Shipping address confirmed in Epic.     Cost and Payment: Patient has a copay of $18.99. They are aware and have authorized the pharmacy to charge the credit card on file.    Delivery Scheduled: Yes, Expected medication delivery date: 05/27/2024.     Medication will be delivered via UPS to the prescription address in Epic WAM.    Tom [facility]  Specialty Technician

## 2024-05-29 DIAGNOSIS — R490 Dysphonia: Secondary | ICD-10-CM | POA: Diagnosis not present

## 2024-05-29 DIAGNOSIS — J0391 Acute recurrent tonsillitis, unspecified: Secondary | ICD-10-CM | POA: Diagnosis not present

## 2024-05-29 DIAGNOSIS — J359 Chronic disease of tonsils and adenoids, unspecified: Secondary | ICD-10-CM | POA: Diagnosis not present

## 2024-06-04 ENCOUNTER — Ambulatory Visit (HOSPITAL_COMMUNITY)
Admission: RE | Admit: 2024-06-04 | Discharge: 2024-06-04 | Disposition: A | Discharge status: Home / Self Care | Source: Ambulatory Visit | Attending: Internal Medicine | Admitting: Internal Medicine

## 2024-06-04 DIAGNOSIS — Z1231 Encounter for screening mammogram for malignant neoplasm of breast: Secondary | ICD-10-CM | POA: Insufficient documentation

## 2024-06-05 DIAGNOSIS — G4730 Sleep apnea, unspecified: Secondary | ICD-10-CM | POA: Diagnosis not present

## 2024-06-05 DIAGNOSIS — M549 Dorsalgia, unspecified: Secondary | ICD-10-CM | POA: Diagnosis not present

## 2024-06-05 DIAGNOSIS — Z940 Kidney transplant status: Secondary | ICD-10-CM | POA: Diagnosis not present

## 2024-06-05 DIAGNOSIS — Z79899 Other long term (current) drug therapy: Secondary | ICD-10-CM | POA: Diagnosis not present

## 2024-06-05 DIAGNOSIS — C50919 Malignant neoplasm of unspecified site of unspecified female breast: Secondary | ICD-10-CM | POA: Diagnosis not present

## 2024-06-05 DIAGNOSIS — Z944 Liver transplant status: Secondary | ICD-10-CM | POA: Diagnosis not present

## 2024-06-05 DIAGNOSIS — M545 Low back pain, unspecified: Secondary | ICD-10-CM | POA: Diagnosis not present

## 2024-06-05 DIAGNOSIS — G8929 Other chronic pain: Secondary | ICD-10-CM | POA: Diagnosis not present

## 2024-06-05 DIAGNOSIS — E618 Deficiency of other specified nutrient elements: Secondary | ICD-10-CM | POA: Diagnosis not present

## 2024-06-05 DIAGNOSIS — R030 Elevated blood-pressure reading, without diagnosis of hypertension: Secondary | ICD-10-CM | POA: Diagnosis not present

## 2024-06-05 DIAGNOSIS — R0602 Shortness of breath: Secondary | ICD-10-CM | POA: Diagnosis not present

## 2024-06-06 DIAGNOSIS — K219 Gastro-esophageal reflux disease without esophagitis: Secondary | ICD-10-CM | POA: Diagnosis not present

## 2024-06-06 DIAGNOSIS — E785 Hyperlipidemia, unspecified: Secondary | ICD-10-CM | POA: Diagnosis not present

## 2024-06-08 DIAGNOSIS — Z940 Kidney transplant status: Principal | ICD-10-CM

## 2024-06-08 DIAGNOSIS — N022 Recurrent and persistent hematuria with diffuse membranous glomerulonephritis: Principal | ICD-10-CM

## 2024-06-08 DIAGNOSIS — N048 Nephrotic syndrome with other morphologic changes: Principal | ICD-10-CM

## 2024-06-08 DIAGNOSIS — N042 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: Principal | ICD-10-CM

## 2024-06-09 DIAGNOSIS — Z79899 Other long term (current) drug therapy: Secondary | ICD-10-CM | POA: Diagnosis not present

## 2024-06-09 DIAGNOSIS — R748 Abnormal levels of other serum enzymes: Principal | ICD-10-CM

## 2024-06-09 DIAGNOSIS — Z940 Kidney transplant status: Principal | ICD-10-CM

## 2024-06-16 NOTE — Unmapped (Signed)
 [facility] Pharm[facility] Refill Coordination Note    Specialty Medication(s) to be Shipped:   Transplant: mycophenolate  mofetil 180mg     Other medication(s) to be shipped: No additional medications requested for fill at this time    Specialty Medications not needed at this time: N/A     Michelle [facility], DOB: 1956/06/26  Phone: [phone] (home)       All above HIPAA information was verified with patient.     Was a Nurse, learning disability used for this call? No    Completed refill call assessment today to schedule patient's medication shipment from the [facility] Delivery Pharm[facility]  [phone]).  All relevant notes have been reviewed.     Specialty medication(s) and dose(s) confirmed: Regimen is correct and unchanged.   Changes to medications: [name] reports no changes at this time.  Changes to insurance: No  New side effects reported not previously addressed with a pharmacist or physician: None reported  Questions for the pharmacist: No    Confirmed patient received a Conservation officer, historic buildings and a Surveyor, mining with first shipment. The patient will receive a drug information handout for each medication shipped and additional FDA Medication Guides as required.       DISEASE/MEDICATION-SPECIFIC INFORMATION        N/A    SPECIALTY MEDICATION ADHERENCE     Medication Adherence    Patient reported X missed doses in the last month: 0  Specialty Medication: mycophenolate  180 MG EC tablet (MYFORTIC )  Patient is on additional specialty medications: No  Patient is on more than two specialty medications: No  Any gaps in refill history greater than 2 weeks in the last 3 months: no  Demonstrates understanding of importance of adherence: yes  Informant: patient  Adherence tools used: patient uses a pill box to manage medications  Confirmed plan for next specialty medication refill: delivery by pharmacy  Refills needed for supportive medications: not needed          Refill Coordination    Has the Patients' Contact Information Changed: No  Is the Shipping Address Different: No         Were doses missed due to medication being on hold? No    mycophenolate  180  mg: 7 days of medicine on hand       REFERRAL TO PHARMACIST     Referral to the pharmacist: Not needed      [facility]     Shipping address confirmed in Epic.     Cost and Payment: Patient has a copay of $18.99. They are aware and have authorized the pharmacy to charge the credit card on file.    Delivery Scheduled: Yes, Expected medication delivery date: 06/19/24.     Medication will be delivered via UPS to the prescription address in Epic WAM.    [name]   [facility]  Specialty Technician

## 2024-06-18 NOTE — Unmapped (Signed)
 Michelle Travis 's Mycophenolate   shipment will be delayed as a result of the medication is too soon to refill until 06/22/2024.     I have reached out to the patient  at [phone] and communicated the delay. We will reschedule the medication for the delivery date that the patient agreed upon.  We have confirmed the delivery date as 06/24/2024, via ups.

## 2024-06-19 DIAGNOSIS — Z940 Kidney transplant status: Secondary | ICD-10-CM | POA: Diagnosis not present

## 2024-06-19 DIAGNOSIS — R748 Abnormal levels of other serum enzymes: Secondary | ICD-10-CM | POA: Diagnosis not present

## 2024-06-19 LAB — CBC W/ DIFFERENTIAL
BANDED NEUTROPHILS ABSOLUTE COUNT: 0 x10E3/uL (ref 0.0–0.1)
BASOPHILS ABSOLUTE COUNT: 0.1 x10E3/uL (ref 0.0–0.2)
BASOPHILS RELATIVE PERCENT: 1 %
EOSINOPHILS ABSOLUTE COUNT: 0.2 x10E3/uL (ref 0.0–0.4)
EOSINOPHILS RELATIVE PERCENT: 3 %
HEMATOCRIT: 43.3 % (ref 34.0–46.6)
HEMOGLOBIN: 13.3 g/dL (ref 11.1–15.9)
IMMATURE GRANULOCYTES: 0 %
LYMPHOCYTES ABSOLUTE COUNT: 1.5 x10E3/uL (ref 0.7–3.1)
LYMPHOCYTES RELATIVE PERCENT: 25 %
MEAN CORPUSCULAR HEMOGLOBIN CONC: 30.7 g/dL — ABNORMAL LOW (ref 31.5–35.7)
MEAN CORPUSCULAR HEMOGLOBIN: 28.5 pg (ref 26.6–33.0)
MEAN CORPUSCULAR VOLUME: 93 fL (ref 79–97)
MONOCYTES ABSOLUTE COUNT: 0.7 x10E3/uL (ref 0.1–0.9)
MONOCYTES RELATIVE PERCENT: 12 %
NEUTROPHILS ABSOLUTE COUNT: 3.5 x10E3/uL (ref 1.4–7.0)
NEUTROPHILS RELATIVE PERCENT: 59 %
PLATELET COUNT: 240 x10E3/uL (ref 150–450)
RED BLOOD CELL COUNT: 4.66 x10E6/uL (ref 3.77–5.28)
RED CELL DISTRIBUTION WIDTH: 13.1 % (ref 11.7–15.4)
WHITE BLOOD CELL COUNT: 6 x10E3/uL (ref 3.4–10.8)

## 2024-06-19 LAB — COMPREHENSIVE METABOLIC PANEL
ALBUMIN: 4.4 g/dL (ref 3.9–4.9)
ALKALINE PHOSPHATASE: 72 IU/L (ref 44–121)
ALT (SGPT): 19 IU/L (ref 0–32)
AST (SGOT): 26 IU/L (ref 0–40)
BILIRUBIN TOTAL (MG/DL) IN SER/PLAS: 0.4 mg/dL (ref 0.0–1.2)
BLOOD UREA NITROGEN: 8 mg/dL (ref 8–27)
BUN / CREAT RATIO: 12 (ref 12–28)
CALCIUM: 9.5 mg/dL (ref 8.7–10.3)
CHLORIDE: 104 mmol/L (ref 96–106)
CO2: 22 mmol/L (ref 20–29)
CREATININE: 0.69 mg/dL (ref 0.57–1.00)
EGFR: 94 mL/min/1.73
GLOBULIN, TOTAL: 2.4 g/dL (ref 1.5–4.5)
GLUCOSE: 104 mg/dL — ABNORMAL HIGH (ref 70–99)
POTASSIUM: 4.7 mmol/L (ref 3.5–5.2)
SODIUM: 144 mmol/L (ref 134–144)
TOTAL PROTEIN: 6.8 g/dL (ref 6.0–8.5)

## 2024-06-20 ENCOUNTER — Encounter: Payer: Self-pay | Admitting: Radiology

## 2024-06-20 LAB — BILIRUBIN, DIRECT: BILIRUBIN DIRECT: 0.13 mg/dL (ref 0.00–0.40)

## 2024-06-20 LAB — PHOSPHORUS: PHOSPHORUS, SERUM: 2.5 mg/dL — ABNORMAL LOW (ref 3.0–4.3)

## 2024-06-20 LAB — MAGNESIUM: MAGNESIUM: 1.6 mg/dL (ref 1.6–2.3)

## 2024-06-20 LAB — GAMMA GT: GAMMA GLUTAMYL TRANSFERASE: 39 IU/L (ref 0–60)

## 2024-06-21 LAB — TACROLIMUS LEVEL: TACROLIMUS BLOOD: 4.3 ng/mL — ABNORMAL LOW (ref 5.0–20.0)

## 2024-06-22 DIAGNOSIS — Z940 Kidney transplant status: Principal | ICD-10-CM

## 2024-06-22 DIAGNOSIS — Z944 Liver transplant status: Principal | ICD-10-CM

## 2024-06-22 NOTE — Unmapped (Signed)
 Lab results from 06/19/24 were reviewed. No changes recommended at this time by the liver team. Pt to repeat labs in one month.     Messaged Pt in MyChart.

## 2024-06-23 NOTE — Unmapped (Signed)
 [facility] 's Mycophenolate   shipment will be delayed as a result of credit card ending in [id number] declined     I have reached out to the patient  at [phone] and left a voicemail message.  We will wait for a call back from the patient to reschedule the delivery.  We have not confirmed the new delivery date.

## 2024-06-24 ENCOUNTER — Other Ambulatory Visit (INDEPENDENT_AMBULATORY_CARE_PROVIDER_SITE_OTHER)

## 2024-06-24 ENCOUNTER — Ambulatory Visit (INDEPENDENT_AMBULATORY_CARE_PROVIDER_SITE_OTHER): Admitting: Orthopaedic Surgery

## 2024-06-24 DIAGNOSIS — M25551 Pain in right hip: Secondary | ICD-10-CM

## 2024-06-24 MED ORDER — METHYLPREDNISOLONE ACETATE 40 MG/ML IJ SUSP
40.0000 mg | INTRAMUSCULAR | Status: AC | PRN
  Administered 2024-06-24: 40 mg via INTRA_ARTICULAR

## 2024-06-24 MED ORDER — LIDOCAINE HCL 1 % IJ SOLN
3.0000 mL | INTRAMUSCULAR | Status: AC | PRN
  Administered 2024-06-24: 3 mL

## 2024-06-24 MED ORDER — BUPIVACAINE HCL 0.5 % IJ SOLN
3.0000 mL | INTRAMUSCULAR | Status: AC | PRN
Start: 2024-06-24 — End: 2024-06-24
  Administered 2024-06-24: 3 mL via INTRA_ARTICULAR

## 2024-06-24 NOTE — Unmapped (Signed)
 [facility] 's Mycophenolate   shipment will be delayed as a result of credit card ending in [phone] declined     I have reached out to the patient  at [phone] and communicated the delay. We will reschedule the medication for the delivery date that the patient agreed upon.  We have confirmed the delivery date as 07/01/2024, via ups.

## 2024-06-24 NOTE — Progress Notes (Signed)
 Office Visit Note   Patient: Savannah Jackson           Date of Birth: 1956/10/18           MRN: [id number] Visit Date: 06/24/2024              Requested by: Carlette Benita Area, MD [address] [place],  [place] [address] PCP: Carlette Benita Area, MD   Assessment & Plan: Visit Diagnoses:  1. Pain in right hip     Plan: History of Present Illness Savannah Jackson is a 68 year old female with a history of hip replacement who presents with right hip pain. She is accompanied by her daughter.  She has experienced right hip pain for about a month, radiating to the lower back. The pain is located on the side of her previous hip replacement. She received an injection in the right hip bursa in March, which provided relief, and requests another injection for her current symptoms. She has arthritis and takes Tylenol  for pain management due to restrictions on using NSAIDs like Advil and Aleve following a kidney and liver transplant in 2018.  Physical Exam MUSCULOSKELETAL: Right hip exam is unchanged from prior visit.  Assessment and Plan Right hip trochanteric bursitis Chronic bursitis with localized pain, likely soft tissue related. Previous injection provided relief. - Administer trochanteric bursal injection in the right hip. - Explained injection will not address low back pain.  Low back pain Chronic pain likely due to arthritis. NSAIDs contraindicated due to transplants. - Discussed steroid pack for systemic relief; she prefers bursal injection despite it not addressing back pain.  Follow-Up Instructions: No follow-ups on file.   Orders:  Orders Placed This Encounter  Procedures   XR HIP UNILAT W OR W/O PELVIS 2-3 VIEWS RIGHT   XR Lumbar Spine 2-3 Views   No orders of the defined types were placed in this encounter.     Procedures: Large Joint Inj: R greater trochanter on 06/24/2024 12:06 PM Indications: pain Details: 22 G needle  Arthrogram:  No  Medications: 3 mL lidocaine  1 %; 3 mL bupivacaine  0.5 %; 40 mg methylPREDNISolone  acetate 40 MG/ML Patient was prepped and draped in the usual sterile fashion.       Clinical Data: No additional findings.   Subjective: Chief Complaint  Patient presents with   Right Hip - Pain    HPI  Review of Systems  Constitutional: Negative.   HENT: Negative.    Eyes: Negative.   Respiratory: Negative.    Cardiovascular: Negative.   Endocrine: Negative.   Musculoskeletal: Negative.   Neurological: Negative.   Hematological: Negative.   Psychiatric/Behavioral: Negative.    All other systems reviewed and are negative.    Objective: Vital Signs: There were no vitals taken for this visit.  Physical Exam Vitals and nursing note reviewed.  Constitutional:      Appearance: She is well-developed.  HENT:     Head: Atraumatic.     Nose: Nose normal.  Eyes:     Extraocular Movements: Extraocular movements intact.  Cardiovascular:     Pulses: Normal pulses.  Pulmonary:     Effort: Pulmonary effort is normal.  Abdominal:     Palpations: Abdomen is soft.  Musculoskeletal:     Cervical back: Neck supple.  Skin:    General: Skin is warm.     Capillary Refill: Capillary refill takes less than 2 seconds.  Neurological:     Mental Status: She is alert. Mental  status is at baseline.  Psychiatric:        Behavior: Behavior normal.        Thought Content: Thought content normal.        Judgment: Judgment normal.     Ortho Exam  Specialty Comments:  No specialty comments available.  Imaging: XR HIP UNILAT W OR W/O PELVIS 2-3 VIEWS RIGHT Result Date: 06/24/2024 X-rays of the pelvis show a stable right total hip arthroplasty without any complications.  XR Lumbar Spine 2-3 Views Result Date: 06/24/2024 X-rays of the lumbar spine show diffuse degenerative changes without any acute abnormalities.    PMFS History: Patient Active Problem List   Diagnosis Date Noted    Unilateral primary osteoarthritis, left knee 04/20/2022   Status post total hip replacement, right 12/09/2021   Unilateral primary osteoarthritis, right hip 09/26/2021   Immunosuppressed status (HCC) 03/22/2020   Encounter for gynecological examination with Papanicolaou smear of cervix 11/13/2019   Screening for colorectal cancer 11/13/2019   History of colonic polyps 05/17/2018   Aftercare following organ transplant 10/17/2017   Kidney transplanted 09/14/2017   Status post liver transplantation (HCC) 09/14/2017   Acute encephalopathy    Hyperammonemia (HCC) 07/15/2017   ESRD on dialysis (HCC)    Rectus sheath hematoma 11/22/2016   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   S/P thoracentesis    Acute renal failure superimposed on stage 4 chronic kidney disease (HCC)    Hypoalbuminemia 10/29/2016   Leukocytosis 10/29/2016   Diastolic dysfunction with chronic heart failure (HCC) 10/29/2016   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   Chronic kidney disease (CKD) stage G3b/A3, moderately decreased glomerular filtration rate (GFR) between 30-44 mL/min/1.73 square meter and albuminuria creatinine ratio greater than 300 mg/g (HCC) 08/30/2016   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 08/30/2016   Thickened endometrium 06/19/2016   PMB (postmenopausal bleeding) 06/07/2016   History of breast cancer 06/07/2016   Cough    Esophageal varices (HCC) 12/21/2015   Alcoholic cirrhosis of liver 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)    Anemia of chronic disease    Peripheral edema 11/26/2015   Anasarca 11/26/2015   Metabolic acidosis 10/19/2015   Lower urinary tract infectious disease 09/24/2015   Elevated troponin 09/24/2015   Altered mental status 08/30/2015   Hepatic encephalopathy (HCC) 08/30/2015   Acute respiratory failure with hypoxia (HCC)    Recurrent right pleural effusion    Alcoholic cirrhosis of liver with ascites (HCC)    Pleural effusion associated with  hepatic disorder 07/03/2015   Chronic kidney disease (CKD), stage IV (severe) (HCC) 07/03/2015   Elevated INR 07/03/2015   Acute respiratory distress 07/03/2015   BRBPR (bright red blood per rectum) 04/21/2015   Pleural effusion, right 04/21/2015   Sleep apnea    Chronic back pain    ETOH abuse    Bilateral leg edema    Cirrhosis, alcoholic (HCC) 02/17/2013   Abdominal pain, left lower quadrant 02/17/2013   Diverticulitis of colon without hemorrhage 02/17/2013   Hypertension    Past Medical History:  Diagnosis Date   Bilateral leg edema    Brain aneurysm 1998   Cancer (HCC) 2002   breast cancer- lumpectromy, , chemotherapy, radiation   Chronic back pain Diverticultis   Chronic kidney disease (CKD) stage G3b/A3, moderately decreased glomerular filtration rate (GFR) between 30-44 mL/min/1.73 square meter and albuminuria creatinine ratio greater than 300 mg/g (HCC) 08/30/2016   Cirrhosis (HCC)    alcoholic   DDD (degenerative disc disease), lumbar    Diverticulosis  scope 2014   ETOH abuse    Hepatic encephalopathy (HCC) 04/23/2017   Hepatomegaly    scope 2014   History of breast cancer 06/07/2016   Hypertension    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   Pneumonia    Rotator cuff syndrome of left shoulder    Sleep apnea    does not wear CPAP   Steal syndrome as complication of dialysis access (HCC)    Thickened endometrium 06/19/2016   Will get endo biopsy    Wears glasses     Family History  Problem Relation Age of Onset   Aneurysm Mother    Other Daughter        knee replacement   Hypertension Daughter     Past Surgical History:  Procedure Laterality Date   AV FISTULA PLACEMENT Left 05/22/2017   Procedure: BRACHIAL ARTERY TO BASCILIC VEIN  FISTULA CREATION;  Surgeon: Harvey Carlin BRAVO, MD;  Location: [facility] OR;  Service: Vascular;  Laterality: Left;   BASCILIC VEIN TRANSPOSITION  Left 08/07/2017   Procedure: BASILIC VEIN TRANSPOSITION SECOND STAGE;  Surgeon: Harvey Carlin BRAVO, MD;  Location: [facility] OR;  Service: Vascular;  Laterality: Left;   BRAIN SURGERY     aneurysm at [facility]   BREAST LUMPECTOMY Right 2002   CATARACT EXTRACTION Bilateral    [facility] 2 or 3 years ago   CHOLECYSTECTOMY     COLONOSCOPY N/A 04/10/2013   Procedure: COLONOSCOPY;  Surgeon: Claudis RAYMOND Rivet, MD;  [facility]: [facility];  Service: Endoscopy;  Laterality: N/A;  1030-rescheduled to 1200 Ann notified pt   COLONOSCOPY N/A 08/07/2018   Procedure: COLONOSCOPY;  Surgeon: Rivet Claudis RAYMOND, MD;  Location: [facility];  Service: Endoscopy;  Laterality: N/A;  930   COLONOSCOPY WITH PROPOFOL  N/A 01/24/2023   Procedure: COLONOSCOPY WITH PROPOFOL ;  Surgeon: Shaaron Lamar HERO, MD;  Location: [facility];  Service: Endoscopy;  Laterality: N/A;  12:45 pm, asa 3   ESOPHAGOGASTRODUODENOSCOPY N/A 04/22/2015   Procedure: ESOPHAGOGASTRODUODENOSCOPY (EGD);  Surgeon: Claudis RAYMOND Rivet, MD;  [facility]: [facility];  Service: Endoscopy;  Laterality: N/A;   EYE SURGERY     IR GENERIC HISTORICAL  07/04/2016   IR RADIOLOGIST EVAL & MGMT 07/04/2016 Wilkie Lent, MD [facility] INTERV RAD   IR RADIOLOGIST EVAL & MGMT  07/24/2017   KIDNEY SURGERY     KIDNEY TRANSPLANT  2018   KNEE ARTHROSCOPY Left    LIVER TRANSPLANT  2018   NEPHRECTOMY TRANSPLANTED ORGAN     POLYPECTOMY  08/07/2018   Procedure: POLYPECTOMY;  Surgeon: Rivet Claudis RAYMOND, MD;  Location: [facility];  Service: Endoscopy;;  colon    RADIOLOGY WITH ANESTHESIA N/A 07/16/2015   Procedure: TIPS;  Surgeon: Medication Radiologist, MD;  Location: [facility] OR;  Service: Radiology;  Laterality: N/A;   REVISON OF ARTERIOVENOUS FISTULA Left 01/06/2019   Procedure: LEFT ARM LIGATION OF ARTERIOVENOUS FISTULA;  Surgeon: Harvey Carlin BRAVO, MD;  Location: [facility] OR;  Service: Vascular;  Laterality: Left;   ROTATOR CUFF REPAIR Left    TOTAL HIP ARTHROPLASTY Right 12/09/2021    Procedure: RIGHT TOTAL HIP ARTHROPLASTY ANTERIOR APPROACH;  Surgeon: Vernetta Lonni GRADE, MD;  Location: [facility]S;  Service: Orthopedics;  Laterality: Right;   TOTAL KNEE ARTHROPLASTY Right    right. 2002   Social History   Occupational History   Occupation: disabled  Tobacco Use   Smoking status: Never   Smokeless tobacco: Never  Vaping  Use   Vaping status: Never Used  Substance and Sexual Activity   Alcohol  use: No    Alcohol /week: 0.0 standard drinks of alcohol     Comment: none since 03/2015   Drug use: Never   Sexual activity: Not Currently    Birth control/protection: Post-menopausal, Abstinence

## 2024-06-27 MED FILL — MYCOPHENOLATE SODIUM 180 MG TABLET,DELAYED RELEASE: ORAL | 30 days supply | Qty: 120 | Fill #1

## 2024-07-04 DIAGNOSIS — G4730 Sleep apnea, unspecified: Secondary | ICD-10-CM | POA: Diagnosis not present

## 2024-07-04 DIAGNOSIS — Z940 Kidney transplant status: Secondary | ICD-10-CM | POA: Diagnosis not present

## 2024-07-04 DIAGNOSIS — E618 Deficiency of other specified nutrient elements: Secondary | ICD-10-CM | POA: Diagnosis not present

## 2024-07-04 DIAGNOSIS — C50919 Malignant neoplasm of unspecified site of unspecified female breast: Secondary | ICD-10-CM | POA: Diagnosis not present

## 2024-07-04 DIAGNOSIS — Z79899 Other long term (current) drug therapy: Secondary | ICD-10-CM | POA: Diagnosis not present

## 2024-07-04 DIAGNOSIS — M549 Dorsalgia, unspecified: Secondary | ICD-10-CM | POA: Diagnosis not present

## 2024-07-04 DIAGNOSIS — M545 Low back pain, unspecified: Secondary | ICD-10-CM | POA: Diagnosis not present

## 2024-07-04 DIAGNOSIS — Z944 Liver transplant status: Secondary | ICD-10-CM | POA: Diagnosis not present

## 2024-07-04 DIAGNOSIS — G8929 Other chronic pain: Secondary | ICD-10-CM | POA: Diagnosis not present

## 2024-07-06 DIAGNOSIS — Z940 Kidney transplant status: Principal | ICD-10-CM

## 2024-07-06 DIAGNOSIS — N022 Recurrent and persistent hematuria with diffuse membranous glomerulonephritis: Principal | ICD-10-CM

## 2024-07-06 DIAGNOSIS — N042 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: Principal | ICD-10-CM

## 2024-07-06 DIAGNOSIS — N048 Nephrotic syndrome with other morphologic changes: Principal | ICD-10-CM

## 2024-07-07 DIAGNOSIS — E785 Hyperlipidemia, unspecified: Secondary | ICD-10-CM | POA: Diagnosis not present

## 2024-07-07 DIAGNOSIS — K219 Gastro-esophageal reflux disease without esophagitis: Secondary | ICD-10-CM | POA: Diagnosis not present

## 2024-07-07 DIAGNOSIS — Z940 Kidney transplant status: Principal | ICD-10-CM

## 2024-07-07 DIAGNOSIS — R748 Abnormal levels of other serum enzymes: Principal | ICD-10-CM

## 2024-07-15 MED ORDER — ATORVASTATIN 10 MG TABLET
ORAL_TABLET | Freq: Every day | ORAL | 3 refills | 90.00000 days
Start: 2024-07-15 — End: 2025-07-15

## 2024-07-15 MED ORDER — PANTOPRAZOLE 40 MG TABLET,DELAYED RELEASE
ORAL_TABLET | Freq: Every day | ORAL | 3 refills | 90.00000 days
Start: 2024-07-15 — End: 2025-07-15

## 2024-07-15 NOTE — Unmapped (Signed)
 [facility] Refill Coordination Note    Specialty Medication(s) to be Shipped:   Transplant: mycophenolate  mofetil 180mg     Other medication(s) to be shipped: atorvastatin  and pantoprazole     Specialty Medications not needed at this time: N/A     Michelle Travis, DOB: 08/29/1956  Phone: [phone] (home)       All above HIPAA information was verified with patient.     Was a Nurse, learning disability used for this call? No    Completed refill call assessment today to schedule patient's medication shipment from the [facility]  [phone]).  All relevant notes have been reviewed.     Specialty medication(s) and dose(s) confirmed: Regimen is correct and unchanged.   Changes to medications: [name] reports no changes at this time.  Changes to insurance: No  New side effects reported not previously addressed with a pharmacist or physician: None reported  Questions for the pharmacist: No    Confirmed patient received a Conservation officer, historic buildings and a Surveyor, mining with first shipment. The patient will receive a drug information handout for each medication shipped and additional FDA Medication Guides as required.       DISEASE/MEDICATION-SPECIFIC INFORMATION        N/A    SPECIALTY MEDICATION ADHERENCE     Medication Adherence    Patient reported X missed doses in the last month: 0  Specialty Medication: mycophenolate  180 MG EC tablet (MYFORTIC )  Patient is on additional specialty medications: No  Patient is on more than two specialty medications: No  Any gaps in refill history greater than 2 weeks in the last 3 months: no  Demonstrates understanding of importance of adherence: yes  Informant: patient  Adherence tools used: patient uses a pill box to manage medications  Confirmed plan for next specialty medication refill: delivery by pharmacy  Refills needed for supportive medications: not needed          Refill Coordination    Has the Patients' Contact Information Changed: No  Is the Shipping Address Different: No         Were doses missed due to medication being on hold? No    mycophenolate  180  mg: 14 days of medicine on hand       REFERRAL TO PHARMACIST     Referral to the pharmacist: Not needed      [facility]     Shipping address confirmed in Epic.     Cost and Payment: Patient has a copay of $18.99. They are aware and have authorized the pharmacy to charge the credit card on file.    Delivery Scheduled: Yes, Expected medication delivery date: 07/29/24.     Medication will be delivered via UPS to the prescription address in Epic WAM.    Michelle Travis   [facility]  Specialty Technician

## 2024-07-16 DIAGNOSIS — R748 Abnormal levels of other serum enzymes: Secondary | ICD-10-CM | POA: Diagnosis not present

## 2024-07-16 DIAGNOSIS — Z940 Kidney transplant status: Principal | ICD-10-CM

## 2024-07-16 DIAGNOSIS — Z944 Liver transplant status: Principal | ICD-10-CM

## 2024-07-16 LAB — CBC W/ DIFFERENTIAL
BANDED NEUTROPHILS ABSOLUTE COUNT: 0 x10E3/uL (ref 0.0–0.1)
BASOPHILS ABSOLUTE COUNT: 0 x10E3/uL (ref 0.0–0.2)
BASOPHILS RELATIVE PERCENT: 1 %
EOSINOPHILS ABSOLUTE COUNT: 0.1 x10E3/uL (ref 0.0–0.4)
EOSINOPHILS RELATIVE PERCENT: 2 %
HEMATOCRIT: 44.6 % (ref 34.0–46.6)
HEMOGLOBIN: 13.9 g/dL (ref 11.1–15.9)
IMMATURE GRANULOCYTES: 0 %
LYMPHOCYTES ABSOLUTE COUNT: 1.6 x10E3/uL (ref 0.7–3.1)
LYMPHOCYTES RELATIVE PERCENT: 27 %
MEAN CORPUSCULAR HEMOGLOBIN CONC: 31.2 g/dL — ABNORMAL LOW (ref 31.5–35.7)
MEAN CORPUSCULAR HEMOGLOBIN: 28.9 pg (ref 26.6–33.0)
MEAN CORPUSCULAR VOLUME: 93 fL (ref 79–97)
MONOCYTES ABSOLUTE COUNT: 0.7 x10E3/uL (ref 0.1–0.9)
MONOCYTES RELATIVE PERCENT: 11 %
NEUTROPHILS ABSOLUTE COUNT: 3.6 x10E3/uL (ref 1.4–7.0)
NEUTROPHILS RELATIVE PERCENT: 58 %
PLATELET COUNT: 252 x10E3/uL (ref 150–450)
RED BLOOD CELL COUNT: 4.81 x10E6/uL (ref 3.77–5.28)
RED CELL DISTRIBUTION WIDTH: 13 % (ref 11.7–15.4)
WHITE BLOOD CELL COUNT: 6.1 x10E3/uL (ref 3.4–10.8)

## 2024-07-16 LAB — COMPREHENSIVE METABOLIC PANEL
ALBUMIN: 4.3 g/dL (ref 3.9–4.9)
ALKALINE PHOSPHATASE: 76 IU/L (ref 49–135)
ALT (SGPT): 19 IU/L (ref 0–32)
AST (SGOT): 20 IU/L (ref 0–40)
BILIRUBIN TOTAL (MG/DL) IN SER/PLAS: 0.5 mg/dL (ref 0.0–1.2)
BLOOD UREA NITROGEN: 16 mg/dL (ref 8–27)
BUN / CREAT RATIO: 21 (ref 12–28)
CALCIUM: 9.6 mg/dL (ref 8.7–10.3)
CHLORIDE: 102 mmol/L (ref 96–106)
CO2: 25 mmol/L (ref 20–29)
CREATININE: 0.77 mg/dL (ref 0.57–1.00)
EGFR: 84 mL/min/1.73
GLOBULIN, TOTAL: 2.5 g/dL (ref 1.5–4.5)
GLUCOSE: 84 mg/dL (ref 70–99)
POTASSIUM: 4.7 mmol/L (ref 3.5–5.2)
SODIUM: 142 mmol/L (ref 134–144)
TOTAL PROTEIN: 6.8 g/dL (ref 6.0–8.5)

## 2024-07-16 MED ORDER — ATORVASTATIN 10 MG TABLET
ORAL_TABLET | Freq: Every day | ORAL | 3 refills | 90.00000 days | Status: CP
Start: 2024-07-16 — End: 2025-07-16

## 2024-07-16 MED ORDER — PANTOPRAZOLE 40 MG TABLET,DELAYED RELEASE
ORAL_TABLET | Freq: Every day | ORAL | 3 refills | 90.00000 days | Status: CP
Start: 2024-07-16 — End: 2025-07-16

## 2024-07-17 LAB — CBC W/ DIFFERENTIAL
BANDED NEUTROPHILS ABSOLUTE COUNT: 0 x10E3/uL (ref 0.0–0.1)
BASOPHILS ABSOLUTE COUNT: 0 x10E3/uL (ref 0.0–0.2)
BASOPHILS RELATIVE PERCENT: 1 %
EOSINOPHILS ABSOLUTE COUNT: 0.1 x10E3/uL (ref 0.0–0.4)
EOSINOPHILS RELATIVE PERCENT: 2 %
HEMATOCRIT: 44.6 % (ref 34.0–46.6)
HEMOGLOBIN: 13.9 g/dL (ref 11.1–15.9)
IMMATURE GRANULOCYTES: 0 %
LYMPHOCYTES ABSOLUTE COUNT: 1.6 x10E3/uL (ref 0.7–3.1)
LYMPHOCYTES RELATIVE PERCENT: 27 %
MEAN CORPUSCULAR HEMOGLOBIN CONC: 31.2 g/dL — ABNORMAL LOW (ref 31.5–35.7)
MEAN CORPUSCULAR HEMOGLOBIN: 28.9 pg (ref 26.6–33.0)
MEAN CORPUSCULAR VOLUME: 93 fL (ref 79–97)
MONOCYTES ABSOLUTE COUNT: 0.7 x10E3/uL (ref 0.1–0.9)
MONOCYTES RELATIVE PERCENT: 11 %
NEUTROPHILS ABSOLUTE COUNT: 3.6 x10E3/uL (ref 1.4–7.0)
NEUTROPHILS RELATIVE PERCENT: 58 %
PLATELET COUNT: 252 x10E3/uL (ref 150–450)
RED BLOOD CELL COUNT: 4.81 x10E6/uL (ref 3.77–5.28)
RED CELL DISTRIBUTION WIDTH: 13 % (ref 11.7–15.4)
WHITE BLOOD CELL COUNT: 6.1 x10E3/uL (ref 3.4–10.8)

## 2024-07-17 LAB — COMPREHENSIVE METABOLIC PANEL
ALBUMIN: 4.3 g/dL (ref 3.9–4.9)
ALKALINE PHOSPHATASE: 76 IU/L (ref 49–135)
ALT (SGPT): 19 IU/L (ref 0–32)
AST (SGOT): 20 IU/L (ref 0–40)
BILIRUBIN TOTAL (MG/DL) IN SER/PLAS: 0.5 mg/dL (ref 0.0–1.2)
BLOOD UREA NITROGEN: 16 mg/dL (ref 8–27)
BUN / CREAT RATIO: 21 (ref 12–28)
CALCIUM: 9.6 mg/dL (ref 8.7–10.3)
CHLORIDE: 102 mmol/L (ref 96–106)
CO2: 25 mmol/L (ref 20–29)
CREATININE: 0.77 mg/dL (ref 0.57–1.00)
EGFR: 84 mL/min/1.73
GLOBULIN, TOTAL: 2.5 g/dL (ref 1.5–4.5)
GLUCOSE: 84 mg/dL (ref 70–99)
POTASSIUM: 4.7 mmol/L (ref 3.5–5.2)
SODIUM: 142 mmol/L (ref 134–144)
TOTAL PROTEIN: 6.8 g/dL (ref 6.0–8.5)

## 2024-07-17 LAB — GAMMA GT: GAMMA GLUTAMYL TRANSFERASE: 40 IU/L (ref 0–60)

## 2024-07-17 LAB — MAGNESIUM: MAGNESIUM: 1.5 mg/dL — ABNORMAL LOW (ref 1.6–2.3)

## 2024-07-17 LAB — BILIRUBIN, DIRECT: BILIRUBIN DIRECT: 0.14 mg/dL (ref 0.00–0.40)

## 2024-07-17 LAB — PHOSPHORUS: PHOSPHORUS, SERUM: 3.3 mg/dL (ref 3.0–4.3)

## 2024-07-18 LAB — TACROLIMUS LEVEL: TACROLIMUS BLOOD: 5.4 ng/mL (ref 5.0–20.0)

## 2024-07-28 MED FILL — MYCOPHENOLATE SODIUM 180 MG TABLET,DELAYED RELEASE: ORAL | 30 days supply | Qty: 120 | Fill #2

## 2024-07-31 DIAGNOSIS — M549 Dorsalgia, unspecified: Secondary | ICD-10-CM | POA: Diagnosis not present

## 2024-07-31 DIAGNOSIS — G8929 Other chronic pain: Secondary | ICD-10-CM | POA: Diagnosis not present

## 2024-07-31 DIAGNOSIS — G4730 Sleep apnea, unspecified: Secondary | ICD-10-CM | POA: Diagnosis not present

## 2024-07-31 DIAGNOSIS — E618 Deficiency of other specified nutrient elements: Secondary | ICD-10-CM | POA: Diagnosis not present

## 2024-07-31 DIAGNOSIS — Z79899 Other long term (current) drug therapy: Secondary | ICD-10-CM | POA: Diagnosis not present

## 2024-07-31 DIAGNOSIS — M545 Low back pain, unspecified: Secondary | ICD-10-CM | POA: Diagnosis not present

## 2024-07-31 DIAGNOSIS — Z940 Kidney transplant status: Secondary | ICD-10-CM | POA: Diagnosis not present

## 2024-07-31 DIAGNOSIS — C50919 Malignant neoplasm of unspecified site of unspecified female breast: Secondary | ICD-10-CM | POA: Diagnosis not present

## 2024-07-31 DIAGNOSIS — Z944 Liver transplant status: Secondary | ICD-10-CM | POA: Diagnosis not present

## 2024-07-31 DIAGNOSIS — M129 Arthropathy, unspecified: Secondary | ICD-10-CM | POA: Diagnosis not present

## 2024-08-03 DIAGNOSIS — N042 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: Principal | ICD-10-CM

## 2024-08-03 DIAGNOSIS — N022 Recurrent and persistent hematuria with diffuse membranous glomerulonephritis: Principal | ICD-10-CM

## 2024-08-03 DIAGNOSIS — N048 Nephrotic syndrome with other morphologic changes: Principal | ICD-10-CM

## 2024-08-03 DIAGNOSIS — Z940 Kidney transplant status: Principal | ICD-10-CM

## 2024-08-04 DIAGNOSIS — Z79899 Other long term (current) drug therapy: Secondary | ICD-10-CM | POA: Diagnosis not present

## 2024-08-04 DIAGNOSIS — Z940 Kidney transplant status: Principal | ICD-10-CM

## 2024-08-04 DIAGNOSIS — R748 Abnormal levels of other serum enzymes: Principal | ICD-10-CM

## 2024-08-06 DIAGNOSIS — K219 Gastro-esophageal reflux disease without esophagitis: Secondary | ICD-10-CM | POA: Diagnosis not present

## 2024-08-06 DIAGNOSIS — E785 Hyperlipidemia, unspecified: Secondary | ICD-10-CM | POA: Diagnosis not present

## 2024-08-20 DIAGNOSIS — R8279 Other abnormal findings on microbiological examination of urine: Principal | ICD-10-CM

## 2024-08-20 DIAGNOSIS — Z940 Kidney transplant status: Principal | ICD-10-CM

## 2024-08-20 DIAGNOSIS — Z944 Liver transplant status: Principal | ICD-10-CM

## 2024-08-21 ENCOUNTER — Ambulatory Visit: Admit: 2024-08-21 | Discharge: 2024-08-21 | Payer: Medicare (Managed Care)

## 2024-08-21 ENCOUNTER — Inpatient Hospital Stay: Admit: 2024-08-21 | Discharge: 2024-08-21 | Payer: Medicare (Managed Care)

## 2024-08-21 ENCOUNTER — Ambulatory Visit: Admit: 2024-08-21 | Discharge: 2024-08-21 | Payer: Medicare (Managed Care) | Attending: Nephrology | Primary: Nephrology

## 2024-08-21 DIAGNOSIS — I5032 Chronic diastolic (congestive) heart failure: Secondary | ICD-10-CM | POA: Diagnosis not present

## 2024-08-21 DIAGNOSIS — Z48298 Encounter for aftercare following other organ transplant: Secondary | ICD-10-CM | POA: Diagnosis not present

## 2024-08-21 DIAGNOSIS — N042 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, unspecified: Secondary | ICD-10-CM | POA: Diagnosis not present

## 2024-08-21 DIAGNOSIS — I10 Essential (primary) hypertension: Secondary | ICD-10-CM | POA: Diagnosis not present

## 2024-08-21 DIAGNOSIS — Z940 Kidney transplant status: Secondary | ICD-10-CM | POA: Diagnosis not present

## 2024-08-21 DIAGNOSIS — Z853 Personal history of malignant neoplasm of breast: Secondary | ICD-10-CM | POA: Diagnosis not present

## 2024-08-21 DIAGNOSIS — J9811 Atelectasis: Secondary | ICD-10-CM | POA: Diagnosis not present

## 2024-08-21 DIAGNOSIS — E870 Hyperosmolality and hypernatremia: Secondary | ICD-10-CM | POA: Diagnosis not present

## 2024-08-21 DIAGNOSIS — Z944 Liver transplant status: Secondary | ICD-10-CM | POA: Diagnosis not present

## 2024-08-21 DIAGNOSIS — D849 Immunodeficiency, unspecified: Secondary | ICD-10-CM | POA: Diagnosis not present

## 2024-08-21 DIAGNOSIS — N271 Small kidney, bilateral: Secondary | ICD-10-CM | POA: Diagnosis not present

## 2024-08-21 DIAGNOSIS — R8279 Other abnormal findings on microbiological examination of urine: Principal | ICD-10-CM

## 2024-08-21 LAB — URINALYSIS WITH MICROSCOPY
BILIRUBIN UA: NEGATIVE
BLOOD UA: NEGATIVE
GLUCOSE UA: NEGATIVE
KETONES UA: NEGATIVE
NITRITE UA: NEGATIVE
PH UA: 5 (ref 5.0–9.0)
PROTEIN UA: NEGATIVE
RBC UA: 15 /HPF — ABNORMAL HIGH (ref <=4)
SPECIFIC GRAVITY UA: 1.019 (ref 1.003–1.030)
SQUAMOUS EPITHELIAL: 4 /HPF (ref 0–5)
UROBILINOGEN UA: <2
WBC UA: 7 /HPF — ABNORMAL HIGH (ref 0–5)

## 2024-08-21 LAB — CBC W/ AUTO DIFF
BASOPHILS ABSOLUTE COUNT: 0.2 10*9/L — ABNORMAL HIGH (ref 0.0–0.1)
BASOPHILS RELATIVE PERCENT: 2 %
EOSINOPHILS ABSOLUTE COUNT: 0.1 10*9/L (ref 0.0–0.5)
EOSINOPHILS RELATIVE PERCENT: 1.9 %
HEMATOCRIT: 41.6 % (ref 34.0–44.0)
HEMOGLOBIN: 13.5 g/dL (ref 11.3–14.9)
LYMPHOCYTES ABSOLUTE COUNT: 1.9 10*9/L (ref 1.1–3.6)
LYMPHOCYTES RELATIVE PERCENT: 25 %
MEAN CORPUSCULAR HEMOGLOBIN CONC: 32.5 g/dL (ref 32.0–36.0)
MEAN CORPUSCULAR HEMOGLOBIN: 28.6 pg (ref 25.9–32.4)
MEAN CORPUSCULAR VOLUME: 88.2 fL (ref 77.6–95.7)
MEAN PLATELET VOLUME: 9.6 fL (ref 6.8–10.7)
MONOCYTES ABSOLUTE COUNT: 0.9 10*9/L — ABNORMAL HIGH (ref 0.3–0.8)
MONOCYTES RELATIVE PERCENT: 11.1 %
NEUTROPHILS ABSOLUTE COUNT: 4.6 10*9/L (ref 1.8–7.8)
NEUTROPHILS RELATIVE PERCENT: 60 %
PLATELET COUNT: 271 10*9/L (ref 150–450)
RED BLOOD CELL COUNT: 4.72 10*12/L (ref 3.95–5.13)
RED CELL DISTRIBUTION WIDTH: 14.2 % (ref 12.2–15.2)
WBC ADJUSTED: 7.7 10*9/L (ref 3.6–11.2)

## 2024-08-21 LAB — COMPREHENSIVE METABOLIC PANEL
ALBUMIN: 4.1 g/dL (ref 3.4–5.0)
ALKALINE PHOSPHATASE: 78 U/L (ref 46–116)
ALT (SGPT): 21 U/L (ref 10–49)
ANION GAP: 14 mmol/L (ref 5–14)
AST (SGOT): 23 U/L (ref <=34)
BILIRUBIN TOTAL: 0.5 mg/dL (ref 0.3–1.2)
BLOOD UREA NITROGEN: 13 mg/dL (ref 9–23)
BUN / CREAT RATIO: 19
CALCIUM: 9.5 mg/dL (ref 8.7–10.4)
CHLORIDE: 101 mmol/L (ref 98–107)
CO2: 26.1 mmol/L (ref 20.0–31.0)
CREATININE: 0.67 mg/dL (ref 0.55–1.02)
EGFR CKD-EPI (2021) FEMALE: >90 mL/min/1.73m2 (ref >=60)
GLUCOSE RANDOM: 105 mg/dL (ref 70–179)
POTASSIUM: 4.1 mmol/L (ref 3.4–4.8)
PROTEIN TOTAL: 7.3 g/dL (ref 5.7–8.2)
SODIUM: 141 mmol/L (ref 135–145)

## 2024-08-21 LAB — TACROLIMUS LEVEL, TROUGH: TACROLIMUS, TROUGH: 4.9 ng/mL — ABNORMAL LOW (ref 5.0–15.0)

## 2024-08-21 LAB — ALBUMIN / CREATININE URINE RATIO
ALBUMIN QUANT URINE: 4.9 mg/dL
ALBUMIN/CREATININE RATIO: 47.5 ug/mg — ABNORMAL HIGH (ref 0.0–30.0)
CREATININE, URINE: 103.2 mg/dL

## 2024-08-21 LAB — PROTEIN / CREATININE RATIO, URINE
CREATININE, URINE: 103.2 mg/dL
PROTEIN URINE: 16.1 mg/dL
PROTEIN/CREAT RATIO, URINE: 0.156

## 2024-08-21 LAB — LIPID PANEL
CHOLESTEROL: 237 mg/dL — ABNORMAL HIGH (ref <200)
HDL CHOLESTEROL: 57 mg/dL (ref >50)
LDL CHOLESTEROL CALCULATED: 138 mg/dL — ABNORMAL HIGH (ref <100)
NON-HDL CHOLESTEROL: 180 mg/dL — ABNORMAL HIGH (ref <130)
TRIGLYCERIDES: 242 mg/dL — ABNORMAL HIGH (ref <150)

## 2024-08-21 LAB — GAMMA GT: GAMMA GLUTAMYL TRANSFERASE: 54 U/L — ABNORMAL HIGH (ref 0–38)

## 2024-08-21 LAB — BILIRUBIN, DIRECT: BILIRUBIN DIRECT: 0.1 mg/dL (ref 0.00–0.30)

## 2024-08-21 LAB — PHOSPHORUS: PHOSPHORUS: 3.2 mg/dL (ref 2.4–5.1)

## 2024-08-21 LAB — MAGNESIUM: MAGNESIUM: 1.4 mg/dL — ABNORMAL LOW (ref 1.6–2.6)

## 2024-08-21 MED ORDER — ATORVASTATIN 20 MG TABLET
ORAL_TABLET | Freq: Every day | ORAL | 3 refills | 100.00000 days | Status: CP
Start: 2024-08-21 — End: 2025-09-25

## 2024-08-21 NOTE — Progress Notes (Signed)
 Transplant Coordinator, Clinic Visit   Pt seen today by transplant nephrology for follow up, reviewed medications and symptoms.          08/21/24 0912   BP: 118/78   Pulse: 64   Temp: 36 ??C (96.8 ??F)   Weight: 81 kg (178 lb 9.6 oz)   Height: 160 cm (5' 3)   PainSc: 0-No pain       Assessment  BP @ Home: Not checking, WNL  BG:   HA/Dizziness/Lightheaded: No  Hand tremors: No  Numbness/tingling: No  Fevers/Chills/sweats: No  Chest Pain/SOB: No  N/V/Heartburn: No  Diarrhea/constipation: No  UTI symptoms: No  Swelling: No  Incision/Drain/Foley: N/A    Hydration status: working on 80 oz     Immunosuppressant last taken: 9 AM yesterday    Immunization status: flu and covid today    Follow up local nephrologist: Will start looking for local neph for follow up q6 months    Follow up PCP: Dr. Carlette    Lab frequency: Monthly    Plan:  - Annual labs and imaging today  - Will start looking for local neph care to see us  annually

## 2024-08-21 NOTE — Progress Notes (Signed)
 Transplant Nephrology Clinic Visit    Assessment and Plan  Michelle Travis is a 68 y.o. 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 She has recurrent PLA2R positive membranous nephropathy and is seen for follow up of her transplant and associated medical problems.     # Status post kidney transplant   # Membranous nephropathy recurrence.  - Kidney biopsy 03/11/21 with PLA2R positive membranous nephropathy  - Initial treatment included Rituximab  1 gm on 03/23/21 and 1 gm on 04/07/21 and 125 mg solumedrol x 2 given with the doses of Rituximab .   - Recurrence of nephrotic range proteinuria prompted treatment with Rituximab  1 gm 02/07/22, 03/07/22, 07/20/22, 01/19/23, 08/30/23, 02/27/24.    - UPC was 5.02 on 03/23/21 improved to 0.623 on 07/20/21 then peaked again to 8.66 on 02/07/22. With maintenance dosing rituximab  UPC and UACR continue to improve.    - Serum creatinine is stable at 0.67 mg/dL (baseline < 0.8 mg/dL)  - DSA screens historically negative (most recent 03/26/24)  - Will continue every 6 months rituximab .   - Patient at increased risk of DVT in view of membranous nephropathy recurrence. With normalization of UPC and serum albumin this risk is much reduced. Will continue aspirin .     # S/P Liver transplant    - LFTs normal today  - She will continue to follow with the liver transplant team.     # Immunosuppression Management   - Tacrolimus  level is 4.9 (target 3-6)  - Continue Envarsus  2.5 mg daily   - Continue Myfortic  360 mg BID  - Continue rituximab  every 6 months    # GI bleed, 09/2022, diverticular origin  # Iron  Deficiency anemia, resolved  - Hospitalized 10/25/22-10/27/22 with rectal bleeding. Colonoscopy 10/26/22 with sigmoid, hepatic flexure, and ascending colon diverticulosis. Repeat colonoscopy 01/24/23 with similar results  - Denies recurrence of bleeding   - s/p feraheme 02/16/23, 02/28/23 and 03/12/23  - Iron  saturation 64% 04/28/24  - Hemoglobin 13.5 today    # BP Management  - BP 118/78  - Currently on no antihypertensive therapy.    # Lipid Management  - Cholesterol 237, LDL 138  - Will increase atorvastatin  from 10 mg daily to 20 mg daily    # Osteoartritis, knees and hips  # S/P right total hip replacement, 11/2021  -  We previously suggested that TKR be delayed until she had a response to membranous recurrence treatment. This was predominantly related to concern about post-op DVT.   - With improvement in proteinuria and normal serum albumin level she can once again engage her ortho team regarding timing for possible knee or hip surgery. Pt reports cost may be prohibitive for her.     History of IgG lambda monoclonal gammopathy.   - Repeat SPEP with immunofixation 03/19/20 and 01/12/23 revealed no monoclonal protein. Kidney biopsy revealed no evidence of MGRS.    History of sleep apnea.   - She denies daytime drowsiness.   - CPAP is not being used due to mask discomfort    Immunizations.  - Prevnar-20 07/20/21.  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 S/p wedge excision of her right helical rim lesion on 11/13/23 with clear margin and no evidence of residual  cancer.   - Follow up with Alm Sick, MD     Cancer screening.   - Mammography 06/04/24 negative, has remote history of breast cancer.  - PAP smear 11/20/22 negative for malignancy.  - Colonoscopy 10/26/22 and 01/24/23 with diverticulosis  - Renal US  08/21/24 no masses  - CXR 08/21/24 unremarkable    Plan Summary and Follow-up  - Rituximab  1 gm every 6 months, next dose scheduled for 08/27/24  - Increase atorvastatin  to 20 mg daily  - COVID and flu vaccine today  - Transplant nephrology clinic 6 months  - Hepatology clinic 03/2025  - Monthly labs including UPC      History of Present Illness  Michelle Travis is a 68 y.o. 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  She was diagnosed with recurrent membranous nephropathy on  03/11/21 and was treated with 2 doses of Rituximab  1 gm on 03/23/21 and 04/07/21 with improvement in proteinuria. UPC then increased to 6.94 on 01/27/22 prompting repat doses of 1 gm rituximab  on 02/07/22, 03/07/22, 07/20/22, 01/19/23, 08/30/23, and 02/27/24 with improvement in proteinuria.     Interval history  She is without complaints today. She has been receiving rituximab  every 6 months and UPC has normalized. She denies edema, chest pain or shortness of breath. She has not been checking home BP. She denies nausea, vomiting, diarrhea, headaches, tremors, dysuria, or allograft site tenderness. She denies melena or hematochezia. She has not been wearing CPAP for OSA due to mask discomfort.     Immunosuppression is presently Envarsus  2.5 mg daily and Myfortic  360 mg bid.      Last dose of Envarsus : 9:00 AM yesterday.    Transplant History:  1. 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 S/P kidney and liver transplants on 09/13/17 at [facility]. Kidney KDPI 26%. CMV D+/R+; EBV D-/R+, hep C Ab +/NAT- (recipient hep C Ab negative)  4. Complicated by rectus sheath hematoma  5. Delayed graft function requiring hemodialysis sessions following transplant (stopped dialysis 11/26/018).  6. Immunosuppression was ??? induction followed by tacrolimus  and myfortic  maintenance therapy.   7. Baseline creatinine <1.0 mg/dL.  8. Kidney biopsy 03/11/21 with recurrent PLA2R positive membranous nephropathy, treated with Rituximab  1 gm x 4 (03/23/21, 04/07/21, 02/07/22, 03/07/22)   9. Rotavirus infection 04/2022 with AKI secondary to volume depletion    Past Medical History:  1. Alcoholic cirrhosis, S/P TIPS  2. Membranous nephropathy, PLA2R positive biopsy 08/30/2016, treated solumedrol initially with little improvement then with Rituximab  (10/2016 and 03/2017).   3. S/P kidney and liver transplants as stated above  4. History of breast cancer, in remission for 17 years, s/p lumpectomy.  5. History of IGG monoclonal gammopathy, IgG lambda. Kidney biopsy 08/30/2016 without MGRS.  6. S/p cerebral aneurysm repair.  7. S/P total knee replacement, right   8. Essential HTN prior to ESLD  9. History of diverticulitis  10. Recurrent pleural effusion, history of pneumonia x 3  11. Osteopenia by DEXA 06/14/2016  12. Hypoattenuating thyroid nodule noted on chest CT 11/11/2016  13. Sleep apnea    Review of Systems    All other systems are reviewed and are negative.    Medications    Current Outpatient Medications   Medication Sig Dispense Refill    acetaminophen  (TYLENOL  8 HOUR) 650 MG CR tablet Take 1 tablet (650  mg total) by mouth every four (4) hours as needed for pain. 30 tablet 0    atorvastatin  (LIPITOR ) 10 MG tablet Take 1 tablet (10 mg total) by mouth daily. 90 tablet 3    buprenorphine  (BELBUCA ) 150 mcg buccal film Apply 1 Film to cheek at bedtime.      mycophenolate  (MYFORTIC ) 180 MG EC tablet Take 2 tablets (360 mg total) by mouth two (2) times a day. 360 tablet 3    mycophenolate  (MYFORTIC ) 180 MG EC tablet Take 2 tablets (360 mg total) by mouth two (2) times a day for 5 days. 20 tablet 0    oxyCODONE  (ROXICODONE ) 10 mg immediate release tablet TAKE 1 (ONE) TABLET BY MOUTH FOUR TIMES DAILY, AS NEEDED      pantoprazole  (PROTONIX ) 40 MG tablet Take 1 tablet (40 mg total) by mouth daily. 90 tablet 3    tacrolimus  (ENVARSUS  XR) 0.75 mg Tb24 extended release tablet Take 2 tablets (1.5 mg total) by mouth in the morning. Take in addition to 1 mg tablet for total daily dose of 2.5 mg once daily. 60 tablet 0    tacrolimus  (ENVARSUS  XR) 1 mg Tb24 extended release tablet Take 1 tablet (1 mg total) by mouth in the morning. Take in addition to 0.75 mg tablets for total daily dose of 2.5 mg daily. 30 tablet 0     No current facility-administered medications for this visit.       Physical Exam    BP 118/78 (BP Site: R Arm, BP Position: Sitting, BP Cuff Size: Medium)  - Pulse 64  - Temp 36 ??C (96.8 ??F) (Temporal)  - Ht 160 cm (5' 3)  - Wt 81 kg (178 lb 9.6 oz)  - LMP  (LMP Unknown)  - BMI 31.64 kg/m??   General: Patient is a pleasant female in no apparent distress.  Eyes: Sclera anicteric.  Neck: Supple without LAD/JVD/bruits.  Lungs: Clear to auscultation bilaterally, no wheezes/rales/rhonchi.  Cardiovascular: Regular rate and rhythm without murmurs, rubs or gallops.  Abdomen: Soft, notender/nondistended. Positive bowel sounds. No hepatosplenomegaly, masses or bruits appreciated.   Extremities: no edema  Skin: Without rash  Neurological: Grossly nonfocal.  Psychiatric: Mood and affect appropriate.    Laboratory Results and Imaging Reviewed

## 2024-08-25 DIAGNOSIS — Z940 Kidney transplant status: Principal | ICD-10-CM

## 2024-08-25 DIAGNOSIS — Z944 Liver transplant status: Principal | ICD-10-CM

## 2024-08-26 DIAGNOSIS — Z940 Kidney transplant status: Principal | ICD-10-CM

## 2024-08-26 DIAGNOSIS — Z944 Liver transplant status: Principal | ICD-10-CM

## 2024-08-26 NOTE — Progress Notes (Signed)
 [facility] and Home Delivery Pharmacy Refill Coordination Note    Specialty Medication(s) to be Shipped:   Transplant: mycophenolate  mofetil 180mg     Other medication(s) to be shipped: No additional medications requested for fill at this time    Specialty Medications not needed at this time: N/A     Michelle Travis, DOB: 1956/03/24  Phone: [phone] (home)       All above HIPAA information was verified with patient.     Was a nurse, learning disability used for this call? No    Completed refill call assessment today to schedule patient's medication shipment from the [facility]  [phone]).  All relevant notes have been reviewed.     Specialty medication(s) and dose(s) confirmed: Regimen is correct and unchanged.   Changes to medications: Lavida reports no changes at this time.  Changes to insurance: No  New side effects reported not previously addressed with a pharmacist or physician: None reported  Questions for the pharmacist: No    Confirmed patient received a Conservation Officer, Historic Buildings and a Surveyor, Mining with first shipment. The patient will receive a drug information handout for each medication shipped and additional FDA Medication Guides as required.       DISEASE/MEDICATION-SPECIFIC INFORMATION        N/A    SPECIALTY MEDICATION ADHERENCE     Medication Adherence    Patient reported X missed doses in the last month: 0  Specialty Medication: mycophenolate  180 MG EC tablet (MYFORTIC )  Patient is on additional specialty medications: No  Adherence tools used: patient uses a pill box to manage medications              Were doses missed due to medication being on hold? No      mycophenolate  180 MG EC tablet (MYFORTIC ): 6-7 days of medicine on hand       REFERRAL TO PHARMACIST     Referral to the pharmacist: Not needed      [facility]     Shipping address confirmed in Epic.     Cost and Payment: Patient has a copay of $18.99. They are aware and have authorized the pharmacy to charge the credit card on file.    Delivery Scheduled: Yes, Expected medication delivery date: 08/29/24.     Medication will be delivered via UPS to the prescription address in Epic WAM.    [facility]  Specialty Technician

## 2024-08-27 ENCOUNTER — Encounter: Admit: 2024-08-27 | Discharge: 2024-08-28 | Payer: Medicare (Managed Care)

## 2024-08-27 DIAGNOSIS — N042 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: Principal | ICD-10-CM

## 2024-08-27 DIAGNOSIS — R8279 Other abnormal findings on microbiological examination of urine: Principal | ICD-10-CM

## 2024-08-27 DIAGNOSIS — N048 Nephrotic syndrome with other morphologic changes: Principal | ICD-10-CM

## 2024-08-27 DIAGNOSIS — Z944 Liver transplant status: Principal | ICD-10-CM

## 2024-08-27 DIAGNOSIS — Z940 Kidney transplant status: Principal | ICD-10-CM

## 2024-08-27 DIAGNOSIS — N022 Recurrent and persistent hematuria with diffuse membranous glomerulonephritis: Principal | ICD-10-CM

## 2024-08-27 LAB — MAGNESIUM: MAGNESIUM: 1.4 mg/dL — ABNORMAL LOW (ref 1.6–2.6)

## 2024-08-27 LAB — COMPREHENSIVE METABOLIC PANEL
ALBUMIN: 4 g/dL (ref 3.4–5.0)
ALKALINE PHOSPHATASE: 65 U/L (ref 46–116)
ALT (SGPT): 18 U/L (ref 10–49)
ANION GAP: 13 mmol/L (ref 5–14)
AST (SGOT): 21 U/L (ref <=34)
BILIRUBIN TOTAL: 0.5 mg/dL (ref 0.3–1.2)
BLOOD UREA NITROGEN: 12 mg/dL (ref 9–23)
BUN / CREAT RATIO: 19
CALCIUM: 9.5 mg/dL (ref 8.7–10.4)
CHLORIDE: 105 mmol/L (ref 98–107)
CO2: 26 mmol/L (ref 20.0–31.0)
CREATININE: 0.62 mg/dL (ref 0.55–1.02)
EGFR CKD-EPI (2021) FEMALE: >90 mL/min/1.73m2 (ref >=60)
GLUCOSE RANDOM: 99 mg/dL (ref 70–99)
POTASSIUM: 4.4 mmol/L (ref 3.4–4.8)
PROTEIN TOTAL: 7.2 g/dL (ref 5.7–8.2)
SODIUM: 144 mmol/L (ref 135–145)

## 2024-08-27 LAB — PROTEIN / CREATININE RATIO, URINE
CREATININE, URINE: 187.2 mg/dL
PROTEIN URINE: 32.5 mg/dL
PROTEIN/CREAT RATIO, URINE: 0.174

## 2024-08-27 LAB — CBC W/ AUTO DIFF
BASOPHILS ABSOLUTE COUNT: 0.1 10*9/L (ref 0.0–0.1)
BASOPHILS RELATIVE PERCENT: 1.8 %
EOSINOPHILS ABSOLUTE COUNT: 0.3 10*9/L (ref 0.0–0.5)
EOSINOPHILS RELATIVE PERCENT: 4.4 %
HEMATOCRIT: 39.8 % (ref 34.0–44.0)
HEMOGLOBIN: 13 g/dL (ref 11.3–14.9)
LYMPHOCYTES ABSOLUTE COUNT: 1.6 10*9/L (ref 1.1–3.6)
LYMPHOCYTES RELATIVE PERCENT: 25.6 %
MEAN CORPUSCULAR HEMOGLOBIN CONC: 32.6 g/dL (ref 32.0–36.0)
MEAN CORPUSCULAR HEMOGLOBIN: 28.8 pg (ref 25.9–32.4)
MEAN CORPUSCULAR VOLUME: 88.4 fL (ref 77.6–95.7)
MEAN PLATELET VOLUME: 9.7 fL (ref 6.8–10.7)
MONOCYTES ABSOLUTE COUNT: 0.7 10*9/L (ref 0.3–0.8)
MONOCYTES RELATIVE PERCENT: 11.5 %
NEUTROPHILS ABSOLUTE COUNT: 3.6 10*9/L (ref 1.8–7.8)
NEUTROPHILS RELATIVE PERCENT: 56.7 %
PLATELET COUNT: 266 10*9/L (ref 150–450)
RED BLOOD CELL COUNT: 4.51 10*12/L (ref 3.95–5.13)
RED CELL DISTRIBUTION WIDTH: 14 % (ref 12.2–15.2)
WBC ADJUSTED: 6.4 10*9/L (ref 3.6–11.2)

## 2024-08-27 LAB — URINALYSIS WITH MICROSCOPY
BILIRUBIN UA: NEGATIVE
GLUCOSE UA: NEGATIVE
HYALINE CASTS: 3 /LPF — ABNORMAL HIGH (ref 0–1)
KETONES UA: NEGATIVE
NITRITE UA: NEGATIVE
PH UA: 5.5 (ref 5.0–9.0)
RBC UA: 19 /HPF — ABNORMAL HIGH (ref <=4)
SPECIFIC GRAVITY UA: 1.028 (ref 1.003–1.030)
SQUAMOUS EPITHELIAL: 15 /HPF — ABNORMAL HIGH (ref 0–5)
UROBILINOGEN UA: <2
WBC UA: 24 /HPF — ABNORMAL HIGH (ref 0–5)

## 2024-08-27 LAB — BILIRUBIN, DIRECT: BILIRUBIN DIRECT: 0.2 mg/dL (ref 0.00–0.30)

## 2024-08-27 LAB — ALBUMIN / CREATININE URINE RATIO
ALBUMIN QUANT URINE: 7.3 mg/dL
ALBUMIN/CREATININE RATIO: 38.5 ug/mg — ABNORMAL HIGH (ref 0.0–30.0)
CREATININE, URINE: 189.7 mg/dL

## 2024-08-27 LAB — PHOSPHORUS: PHOSPHORUS: 3.4 mg/dL (ref 2.4–5.1)

## 2024-08-27 LAB — TACROLIMUS LEVEL, TROUGH: TACROLIMUS, TROUGH: 5 ng/mL (ref 5.0–15.0)

## 2024-08-27 LAB — GAMMA GT: GAMMA GLUTAMYL TRANSFERASE: 47 U/L — ABNORMAL HIGH (ref 0–38)

## 2024-08-27 MED ADMIN — acetaminophen (TYLENOL) tablet 650 mg: 650 mg | ORAL | @ 13:00:00 | Stop: 2024-08-27

## 2024-08-27 MED ADMIN — methylPREDNISolone sodium succinate (SOLU-Medrol) injection 125 mg: 125 mg | INTRAVENOUS | @ 13:00:00 | Stop: 2024-08-27

## 2024-08-27 MED ADMIN — diphenhydrAMINE (BENADRYL) injection 25 mg: 25 mg | INTRAVENOUS | @ 13:00:00 | Stop: 2024-08-27

## 2024-08-27 MED ADMIN — riTUXimab-abbs (TRUXIMA) 1,000 mg in sodium chloride (NS) 0.9 % 640 mL IVPB: 1000 mg | INTRAVENOUS | @ 14:00:00 | Stop: 2024-08-27

## 2024-08-27 MED ADMIN — sodium chloride (NS) 0.9 % infusion: 20 mL/h | INTRAVENOUS | @ 14:00:00 | Stop: 2024-08-27

## 2024-08-27 NOTE — Progress Notes (Signed)
 9184 Patient arrived to the Transplant Infusion room for TRUXIMA  1000mg  infusion. Condition: well, Mobility: ambulating, accompanied by relative   See Flowsheet and MAR for all details of visit.  0816 VS stable.   9147 PIV placed with VAT RN assistance and secured with coban, labs collected and sent; urine collected and sent.  0900 Premedications given.  Today's lab results: WBC 6.4 (must be >2.0 or hold infusion and notify provider) and Platelets 266 (must be > 50 or hold infusion and notify provider).   9060 Truxima  infusion initiated rituximab : rituximab  subsequent infusion protocol to start 100 mg/hr, rate increase by 100 mg/hr every 30 minutes as tolerated till maximum rate of 400 mg/hr. Patient educated to notify nursing staff if they experience urticaria, erythema, nausea, shortness of breath, metallic taste in mouth, excessive oral or postnasal drainage and any other changes in status which could be side effects from initiating this medication.  1312 Truxima  infusion completed, pt tolerated infusion well.  1312 VS stable.  1330 PIV removed and site secured with coban.  1335 Patient left clinic today, condition: well; mobility: via wheelchair; accompanied by relative.

## 2024-08-28 MED FILL — MYCOPHENOLATE SODIUM 180 MG TABLET,DELAYED RELEASE: ORAL | 30 days supply | Qty: 120 | Fill #3

## 2024-08-29 DIAGNOSIS — G4730 Sleep apnea, unspecified: Secondary | ICD-10-CM | POA: Diagnosis not present

## 2024-08-29 DIAGNOSIS — M549 Dorsalgia, unspecified: Secondary | ICD-10-CM | POA: Diagnosis not present

## 2024-08-29 DIAGNOSIS — Z944 Liver transplant status: Secondary | ICD-10-CM | POA: Diagnosis not present

## 2024-08-29 DIAGNOSIS — M545 Low back pain, unspecified: Secondary | ICD-10-CM | POA: Diagnosis not present

## 2024-08-29 DIAGNOSIS — R030 Elevated blood-pressure reading, without diagnosis of hypertension: Secondary | ICD-10-CM | POA: Diagnosis not present

## 2024-08-29 DIAGNOSIS — E618 Deficiency of other specified nutrient elements: Secondary | ICD-10-CM | POA: Diagnosis not present

## 2024-08-29 DIAGNOSIS — C50919 Malignant neoplasm of unspecified site of unspecified female breast: Secondary | ICD-10-CM | POA: Diagnosis not present

## 2024-08-29 DIAGNOSIS — Z79899 Other long term (current) drug therapy: Secondary | ICD-10-CM | POA: Diagnosis not present

## 2024-08-29 DIAGNOSIS — Z940 Kidney transplant status: Secondary | ICD-10-CM | POA: Diagnosis not present

## 2024-08-31 DIAGNOSIS — N042 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: Principal | ICD-10-CM

## 2024-08-31 DIAGNOSIS — Z940 Kidney transplant status: Principal | ICD-10-CM

## 2024-08-31 DIAGNOSIS — N048 Nephrotic syndrome with other morphologic changes: Principal | ICD-10-CM

## 2024-08-31 DIAGNOSIS — N022 Recurrent and persistent hematuria with diffuse membranous glomerulonephritis: Principal | ICD-10-CM

## 2024-09-01 ENCOUNTER — Encounter: Payer: Self-pay | Admitting: Radiology

## 2024-09-01 DIAGNOSIS — R748 Abnormal levels of other serum enzymes: Principal | ICD-10-CM

## 2024-09-01 DIAGNOSIS — Z940 Kidney transplant status: Principal | ICD-10-CM

## 2024-09-09 LAB — CBC W/ DIFFERENTIAL
BANDED NEUTROPHILS ABSOLUTE COUNT: 0 x10E3/uL (ref 0.0–0.1)
BASOPHILS ABSOLUTE COUNT: 0.1 x10E3/uL (ref 0.0–0.2)
BASOPHILS RELATIVE PERCENT: 1 %
EOSINOPHILS ABSOLUTE COUNT: 0.2 x10E3/uL (ref 0.0–0.4)
EOSINOPHILS RELATIVE PERCENT: 3 %
HEMATOCRIT: 43.4 % (ref 34.0–46.6)
HEMOGLOBIN: 13.8 g/dL (ref 11.1–15.9)
IMMATURE GRANULOCYTES: 0 %
LYMPHOCYTES ABSOLUTE COUNT: 1.5 x10E3/uL (ref 0.7–3.1)
LYMPHOCYTES RELATIVE PERCENT: 25 %
MEAN CORPUSCULAR HEMOGLOBIN CONC: 31.8 g/dL (ref 31.5–35.7)
MEAN CORPUSCULAR HEMOGLOBIN: 29.4 pg (ref 26.6–33.0)
MEAN CORPUSCULAR VOLUME: 92 fL (ref 79–97)
MONOCYTES ABSOLUTE COUNT: 0.6 x10E3/uL (ref 0.1–0.9)
MONOCYTES RELATIVE PERCENT: 10 %
NEUTROPHILS ABSOLUTE COUNT: 3.7 x10E3/uL (ref 1.4–7.0)
NEUTROPHILS RELATIVE PERCENT: 61 %
PLATELET COUNT: 280 x10E3/uL (ref 150–450)
RED BLOOD CELL COUNT: 4.7 x10E6/uL (ref 3.77–5.28)
RED CELL DISTRIBUTION WIDTH: 13.1 % (ref 11.7–15.4)
WHITE BLOOD CELL COUNT: 6.1 x10E3/uL (ref 3.4–10.8)

## 2024-09-10 LAB — COMPREHENSIVE METABOLIC PANEL
ALBUMIN: 4.6 g/dL (ref 3.9–4.9)
ALKALINE PHOSPHATASE: 66 IU/L (ref 49–135)
ALT (SGPT): 18 IU/L (ref 0–32)
AST (SGOT): 21 IU/L (ref 0–40)
BILIRUBIN TOTAL (MG/DL) IN SER/PLAS: 0.4 mg/dL (ref 0.0–1.2)
BLOOD UREA NITROGEN: 13 mg/dL (ref 8–27)
BUN / CREAT RATIO: 15 (ref 12–28)
CALCIUM: 9.5 mg/dL (ref 8.7–10.3)
CHLORIDE: 102 mmol/L (ref 96–106)
CO2: 26 mmol/L (ref 20–29)
CREATININE: 0.86 mg/dL (ref 0.57–1.00)
EGFR: 74 mL/min/1.73
GLOBULIN, TOTAL: 2.2 g/dL (ref 1.5–4.5)
GLUCOSE: 104 mg/dL — ABNORMAL HIGH (ref 70–99)
POTASSIUM: 4.9 mmol/L (ref 3.5–5.2)
SODIUM: 143 mmol/L (ref 134–144)
TOTAL PROTEIN: 6.8 g/dL (ref 6.0–8.5)

## 2024-09-10 LAB — BILIRUBIN, DIRECT: BILIRUBIN DIRECT: 0.12 mg/dL (ref 0.00–0.40)

## 2024-09-10 LAB — MAGNESIUM: MAGNESIUM: 1.6 mg/dL (ref 1.6–2.3)

## 2024-09-10 LAB — GAMMA GT: GAMMA GLUTAMYL TRANSFERASE: 46 IU/L (ref 0–60)

## 2024-09-10 LAB — PHOSPHORUS: PHOSPHORUS, SERUM: 2.8 mg/dL — ABNORMAL LOW (ref 3.0–4.3)

## 2024-09-11 LAB — TACROLIMUS LEVEL: TACROLIMUS BLOOD: 3.3 ng/mL — ABNORMAL LOW (ref 5.0–20.0)

## 2024-09-16 DIAGNOSIS — Z940 Kidney transplant status: Principal | ICD-10-CM

## 2024-09-19 NOTE — Progress Notes (Signed)
 [facility] and Home Delivery Pharmacy Refill Coordination Note    Specialty Medication(s) to be Shipped:   Transplant: mycophenolate  mofetil 180mg     Other medication(s) to be shipped: No additional medications requested for fill at this time    Specialty Medications not needed at this time: N/A     Michelle Travis, DOB: 1956/01/30  Phone: [phone] (home)       All above HIPAA information was verified with patient.     Was a nurse, learning disability used for this call? No    Completed refill call assessment today to schedule patient's medication shipment from the [facility]  [phone]).  All relevant notes have been reviewed.     Specialty medication(s) and dose(s) confirmed: Regimen is correct and unchanged.   Changes to medications: Michelle Travis reports no changes at this time.  Changes to insurance: No  New side effects reported not previously addressed with a pharmacist or physician: None reported  Questions for the pharmacist: No    Confirmed patient received a Conservation Officer, Historic Buildings and a Surveyor, Mining with first shipment. The patient will receive a drug information handout for each medication shipped and additional FDA Medication Guides as required.       DISEASE/MEDICATION-SPECIFIC INFORMATION        N/A    SPECIALTY MEDICATION ADHERENCE     Medication Adherence    Patient reported X missed doses in the last month: 0  Specialty Medication: mycophenolate  180 MG EC tablet (MYFORTIC )  Patient is on additional specialty medications: No  Adherence tools used: patient uses a pill box to manage medications              Were doses missed due to medication being on hold? No      mycophenolate  180 MG EC tablet (MYFORTIC ): 7 days of medicine on hand       REFERRAL TO PHARMACIST     Referral to the pharmacist: Not needed      [facility]     Shipping address confirmed in Epic.     Cost and Payment: Patient has a copay of $18.99. They are aware and have authorized the pharmacy to charge the credit card on file.    Delivery Scheduled: Yes, Expected medication delivery date: 09/30/24.     Medication will be delivered via UPS to the prescription address in Epic WAM.    [facility]  Specialty Technician

## 2024-09-21 DIAGNOSIS — Z940 Kidney transplant status: Principal | ICD-10-CM

## 2024-09-21 NOTE — Progress Notes (Signed)
trf

## 2024-09-29 DIAGNOSIS — R748 Abnormal levels of other serum enzymes: Principal | ICD-10-CM

## 2024-09-29 DIAGNOSIS — Z940 Kidney transplant status: Principal | ICD-10-CM

## 2024-09-29 MED FILL — MYCOPHENOLATE SODIUM 180 MG TABLET,DELAYED RELEASE: ORAL | 30 days supply | Qty: 120 | Fill #4

## 2024-10-02 DIAGNOSIS — Z940 Kidney transplant status: Principal | ICD-10-CM

## 2024-10-02 DIAGNOSIS — Z944 Liver transplant status: Principal | ICD-10-CM

## 2024-10-02 MED ORDER — TACROLIMUS XR 0.75 MG TABLET,EXTENDED RELEASE 24 HR
ORAL_TABLET | Freq: Every day | ORAL | 3 refills | 90.00000 days | Status: CP
Start: 2024-10-02 — End: 2025-10-02

## 2024-10-02 MED ORDER — TACROLIMUS XR 1 MG TABLET,EXTENDED RELEASE 24 HR
ORAL_TABLET | Freq: Every day | ORAL | 3 refills | 90.00000 days | Status: CP
Start: 2024-10-02 — End: 2025-10-02

## 2024-10-09 LAB — CBC W/ DIFFERENTIAL
BANDED NEUTROPHILS ABSOLUTE COUNT: 0 x10E3/uL (ref 0.0–0.1)
BASOPHILS ABSOLUTE COUNT: 0 x10E3/uL (ref 0.0–0.2)
BASOPHILS RELATIVE PERCENT: 1 %
EOSINOPHILS ABSOLUTE COUNT: 0.1 x10E3/uL (ref 0.0–0.4)
EOSINOPHILS RELATIVE PERCENT: 2 %
HEMATOCRIT: 43.5 % (ref 34.0–46.6)
HEMOGLOBIN: 14.2 g/dL (ref 11.1–15.9)
IMMATURE GRANULOCYTES: 0 %
LYMPHOCYTES ABSOLUTE COUNT: 1.6 x10E3/uL (ref 0.7–3.1)
LYMPHOCYTES RELATIVE PERCENT: 29 %
MEAN CORPUSCULAR HEMOGLOBIN CONC: 32.6 g/dL (ref 31.5–35.7)
MEAN CORPUSCULAR HEMOGLOBIN: 29.6 pg (ref 26.6–33.0)
MEAN CORPUSCULAR VOLUME: 91 fL (ref 79–97)
MONOCYTES ABSOLUTE COUNT: 0.6 x10E3/uL (ref 0.1–0.9)
MONOCYTES RELATIVE PERCENT: 11 %
NEUTROPHILS ABSOLUTE COUNT: 3.3 x10E3/uL (ref 1.4–7.0)
NEUTROPHILS RELATIVE PERCENT: 57 %
PLATELET COUNT: 270 x10E3/uL (ref 150–450)
RED BLOOD CELL COUNT: 4.79 x10E6/uL (ref 3.77–5.28)
RED CELL DISTRIBUTION WIDTH: 12.7 % (ref 11.7–15.4)
WHITE BLOOD CELL COUNT: 5.7 x10E3/uL (ref 3.4–10.8)

## 2024-10-10 LAB — COMPREHENSIVE METABOLIC PANEL
ALBUMIN: 4.5 g/dL (ref 3.9–4.9)
ALKALINE PHOSPHATASE: 59 IU/L (ref 49–135)
ALT (SGPT): 20 IU/L (ref 0–32)
AST (SGOT): 25 IU/L (ref 0–40)
BILIRUBIN TOTAL (MG/DL) IN SER/PLAS: 0.4 mg/dL (ref 0.0–1.2)
BLOOD UREA NITROGEN: 14 mg/dL (ref 8–27)
BUN / CREAT RATIO: 21 (ref 12–28)
CALCIUM: 9.5 mg/dL (ref 8.7–10.3)
CHLORIDE: 103 mmol/L (ref 96–106)
CO2: 24 mmol/L (ref 20–29)
CREATININE: 0.68 mg/dL (ref 0.57–1.00)
EGFR: 94 mL/min/1.73
GLOBULIN, TOTAL: 2.5 g/dL (ref 1.5–4.5)
GLUCOSE: 103 mg/dL — ABNORMAL HIGH (ref 70–99)
POTASSIUM: 4.7 mmol/L (ref 3.5–5.2)
SODIUM: 144 mmol/L (ref 134–144)
TOTAL PROTEIN: 7 g/dL (ref 6.0–8.5)

## 2024-10-10 LAB — GAMMA GT: GAMMA GLUTAMYL TRANSFERASE: 40 IU/L (ref 0–60)

## 2024-10-10 LAB — PHOSPHORUS: PHOSPHORUS, SERUM: 3 mg/dL (ref 3.0–4.3)

## 2024-10-10 LAB — MAGNESIUM: MAGNESIUM: 1.6 mg/dL (ref 1.6–2.3)

## 2024-10-10 LAB — BILIRUBIN, DIRECT: BILIRUBIN DIRECT: 0.15 mg/dL (ref 0.00–0.40)

## 2024-10-10 LAB — TACROLIMUS LEVEL: TACROLIMUS BLOOD: 5.8 ng/mL (ref 5.0–20.0)

## 2024-10-11 DIAGNOSIS — Z940 Kidney transplant status: Principal | ICD-10-CM

## 2024-10-11 DIAGNOSIS — Z944 Liver transplant status: Principal | ICD-10-CM

## 2024-10-11 NOTE — Telephone Encounter (Signed)
 Lab results from 10/09/24 were reviewed. Liver labs stable, tac in goal. No changes recommended at this time. Pt to repeat liver labs in March.     Messaged Pt in MyChart.

## 2024-10-12 DIAGNOSIS — Z940 Kidney transplant status: Principal | ICD-10-CM

## 2024-10-12 DIAGNOSIS — N048 Nephrotic syndrome with other morphologic changes: Principal | ICD-10-CM

## 2024-10-12 DIAGNOSIS — N042 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: Principal | ICD-10-CM

## 2024-10-12 DIAGNOSIS — N022 Recurrent and persistent hematuria with diffuse membranous glomerulonephritis: Principal | ICD-10-CM

## 2024-10-17 DIAGNOSIS — Z940 Kidney transplant status: Principal | ICD-10-CM

## 2024-10-20 DIAGNOSIS — Z940 Kidney transplant status: Principal | ICD-10-CM

## 2024-10-27 DIAGNOSIS — R748 Abnormal levels of other serum enzymes: Principal | ICD-10-CM

## 2024-10-27 DIAGNOSIS — Z940 Kidney transplant status: Principal | ICD-10-CM

## 2024-11-03 DIAGNOSIS — Z940 Kidney transplant status: Principal | ICD-10-CM

## 2024-11-03 DIAGNOSIS — Z944 Liver transplant status: Principal | ICD-10-CM

## 2024-11-03 NOTE — Progress Notes (Signed)
Updated med chart.

## 2024-11-03 NOTE — Progress Notes (Signed)
 [facility] and Home Delivery Pharmacy Clinical Assessment & Refill Coordination Note    Michelle Travis, DOB: Jul 03, 1956  Phone: [phone] (home)     All above HIPAA information was verified with patient.     Was a nurse, learning disability used for this call? 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    Medication list has been reviewed and updated in Epic: Yes  4 high priority alerts noted on med rec today:  Norco allergy with various meds - pt says ok with all meds she takes  Duplication belbuca /oxycodone  - clinic notified  Duplication envarsus  - pt on 2 strengths, not at [facility]  Duplication mycophenolate  - gets at [facility], messaged clinic to dc duplicate    Allergies[2]    Changes to allergies: No    Allergies have been reviewed and updated in Epic: Yes    SPECIALTY MEDICATION ADHERENCE     Mycophenolate  180mg   : 5 days of medicine on hand     Specialty medication is an injection or given on a cycle: No    Medication Adherence    Patient reported X missed doses in the last month: 0  Specialty Medication: mycophenolate  180 MG EC tablet (MYFORTIC )  Patient is on additional specialty medications: No  Adherence tools used: patient uses a pill box to manage medications          Specialty medication(s) dose(s) confirmed: Regimen is correct and unchanged.     Are there any concerns with adherence? No    Adherence counseling provided? Not needed    CLINICAL MANAGEMENT AND INTERVENTION      Clinical Benefit Assessment:    Do you feel the medicine is effective or helping your condition? Yes    Clinical Benefit counseling provided? Not needed    Adverse Effects Assessment:    Are you experiencing any side effects? No    Are you experiencing difficulty administering your medicine? No    Quality of Life Assessment:    Quality of Life    Rheumatology  Oncology  Dermatology  Cystic Fibrosis          How many days over the past month did your transplant  keep you from your normal activities? For example, brushing your teeth or getting up in the morning. 0    Have you discussed this with your provider? Not needed    Acute Infection Status:    Acute infections noted within Epic:  No active infections    Patient reported infection: None    Therapy Appropriateness:    Is the medication and dose appropriate considering the patient???s diagnosis, treatment, and disease journey, comorbidities, medical history, current medications, allergies, therapeutic goals, self-administration ability, and access barriers? Yes, therapy is appropriate and should be continued     Clinical Intervention:    Was an intervention completed as part of this clinical assessment? No    DISEASE/MEDICATION-SPECIFIC INFORMATION      N/A    Solid Organ Transplant: Not Applicable    PATIENT SPECIFIC NEEDS     Does the patient have any physical, cognitive, or cultural barriers? No    Is the patient high risk? Yes, patient is taking a REMS drug. Medication is dispensed in compliance with REMS program    Does the patient require physician intervention or other additional services (i.e., nutrition, smoking cessation, social work)? No  Does the patient have an additional or emergency contact listed in their chart? Yes    SOCIAL DETERMINANTS OF HEALTH     At the [facility], we have learned that life circumstances - like trouble affording food, housing, utilities, or transportation can affect the health of many of our patients.   That is why we wanted to ask: are you currently experiencing any life circumstances that are negatively impacting your health and/or quality of life? Patient declined to answer    Social Drivers of Health     Food Insecurity: No Food Insecurity (11/20/2022)    Received from [facility]    Hunger Vital Sign     Within the past 12 months, you worried that your food would run out before you got the money to buy more.: Never true     Within the past 12 months, the food you bought just didn't last and you didn't have money to get more.: Never true   Tobacco Use: Low Risk (08/21/2024)    Patient History     Smoking Tobacco Use: Never     Smokeless Tobacco Use: Never     Passive Exposure: Never   Transportation Needs: No Transportation Needs (11/20/2022)    Received from [facility] - Transportation     Lack of Transportation (Medical): No     Lack of Transportation (Non-Medical): No   Alcohol Use: Not At Risk (10/25/2022)    Alcohol Use     How often do you have a drink containing alcohol?: Never     How many drinks containing alcohol do you have on a typical day when you are drinking?: Not on file     How often do you have 5 or more drinks on one occasion?: Never   Housing: Not on file   Physical Activity: Sufficiently Active (11/20/2022)    Received from [facility]    Exercise Vital Sign     On average, how many days per week do you engage in moderate to strenuous exercise (like a brisk walk)?: 7 days     On average, how many minutes do you engage in exercise at this level?: 70 min   Utilities: Not At Risk (11/20/2022)    Received from [facility] Utilities     Threatened with loss of utilities: No   Stress: No Stress Concern Present (11/20/2022)    Received from [facility] of Occupational [facility] - Occupational Stress Questionnaire     Feeling of Stress : Only a little   Interpersonal Safety: Not At Risk (04/28/2024)    Interpersonal Safety     Unsafe Where You Currently Live: No     Physically Hurt by Anyone: No     Abused by Anyone: No   Substance Use: Not on file (10/26/2023)   Intimate Partner Violence: Not At Risk (11/20/2022)    Received from [facility]    Humiliation, Afraid, Rape, and Kick questionnaire     Within the last year, have you been afraid of your partner or ex-partner?: No     Within the last year, have you been humiliated or emotionally abused in other ways by your partner or ex-partner?: No Within the last year, have you been kicked, hit, slapped, or otherwise physically hurt by your partner or ex-partner?: No     Within the last year, have you been raped or forced to have any kind of sexual  activity by your partner or ex-partner?: No   Social Connections: Moderately Isolated (11/20/2022)    Received from [facility]    Social Connection and Isolation Panel     In a typical week, how many times do you talk on the phone with family, friends, or neighbors?: More than three times a week     How often do you get together with friends or relatives?: Once a week     How often do you attend church or religious services?: More than 4 times per year     Do you belong to any clubs or organizations such as church groups, unions, fraternal or athletic groups, or school groups?: No     How often do you attend meetings of the clubs or organizations you belong to?: Never     Are you married, widowed, divorced, separated, never married, or living with a partner?: Widowed   Physicist, Medical Strain: Low Risk  (11/20/2022)    Received from [facility]    Overall Financial Resource Strain (CARDIA)     Difficulty of Paying Living Expenses: Not hard at all   Health Literacy: Not on file   Internet Connectivity: No Internet connectivity concern identified (10/25/2022)    Internet Connectivity     Do you have access to internet services: Yes     How do you connect to the internet: Personal Device at home     Is your internet connection strong enough for you to watch video on your device without major problems?: Yes     Do you have enough data to get through the month?: Yes     Does at least one of the devices have a camera that you can use for video chat?: Yes       Would you be willing to receive help with any of the needs that you have identified today? 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 They are aware and have authorized the pharmacy to charge the credit card on file.    Delivery Scheduled: Yes, Expected medication delivery date: 11/05/2024.     Medication will be delivered via UPS to the confirmed prescription address in [facility].    The patient will receive a drug information handout for each medication shipped and additional FDA Medication Guides as required.  Verified that patient has previously received a Conservation Officer, Historic Buildings and a Surveyor, Mining.    The patient or caregiver noted above participated in the development of this care plan and knows that they can request review of or adjustments to the care plan at any time.      All of the patient's questions and concerns have been addressed.    Ronal FORBES Reusing, PharmD   [facility] Delivery [facility] Specialty Pharmacist       [1]   Current Outpatient Medications   Medication Sig Dispense Refill    acetaminophen  (TYLENOL  8 HOUR) 650 MG CR tablet Take 1 tablet (650 mg total) by mouth every four (4) hours as needed for pain. 30 tablet 0    atorvastatin  (LIPITOR ) 20 MG tablet Take 1 tablet (20 mg total)  by mouth daily. 100 tablet 3    buprenorphine  (BELBUCA ) 150 mcg buccal film Apply 1 Film to cheek at bedtime.      mycophenolate  (MYFORTIC ) 180 MG EC tablet Take 2 tablets (360 mg total) by mouth two (2) times a day. 360 tablet 3    mycophenolate  (MYFORTIC ) 180 MG EC tablet Take 2 tablets (360 mg total) by mouth two (2) times a day for 5 days. 20 tablet 0    oxyCODONE  (ROXICODONE ) 10 mg immediate release tablet TAKE 1 (ONE) TABLET BY MOUTH FOUR TIMES DAILY, AS NEEDED      pantoprazole  (PROTONIX ) 40 MG tablet Take 1 tablet (40 mg total) by mouth daily. 90 tablet 3    tacrolimus  (ENVARSUS  XR) 0.75 mg Tb24 extended release tablet Take 2 tablets (1.5 mg total) by mouth in the morning. Take in addition to 1 mg tablet for total daily dose of 2.5 mg once daily. 180 tablet 3    tacrolimus  (ENVARSUS  XR) 1 mg Tb24 extended release tablet Take 1 tablet (1 mg total) by mouth in the morning. Take in addition to 0.75 mg tablets for total daily dose of 2.5 mg daily. 90 tablet 3     No current facility-administered medications for this visit.   [2]   Allergies  Allergen Reactions    Latex Swelling and Rash     Discoloration of skin.   Discoloration of skin.     Hydrocodone Itching    Hydrocodone-Acetaminophen  Itching

## 2024-11-04 MED FILL — MYCOPHENOLATE SODIUM 180 MG TABLET,DELAYED RELEASE: ORAL | 90 days supply | Qty: 360 | Fill #5

## 2024-11-04 MED FILL — PANTOPRAZOLE 40 MG TABLET,DELAYED RELEASE: ORAL | 90 days supply | Qty: 90 | Fill #0

## 2024-11-05 LAB — CBC W/ DIFFERENTIAL
BANDED NEUTROPHILS ABSOLUTE COUNT: 0 x10E3/uL (ref 0.0–0.1)
BASOPHILS ABSOLUTE COUNT: 0.1 x10E3/uL (ref 0.0–0.2)
BASOPHILS RELATIVE PERCENT: 1 %
EOSINOPHILS ABSOLUTE COUNT: 0.2 x10E3/uL (ref 0.0–0.4)
EOSINOPHILS RELATIVE PERCENT: 2 %
HEMATOCRIT: 42 % (ref 34.0–46.6)
HEMOGLOBIN: 13.7 g/dL (ref 11.1–15.9)
IMMATURE GRANULOCYTES: 0 %
LYMPHOCYTES ABSOLUTE COUNT: 1.5 x10E3/uL (ref 0.7–3.1)
LYMPHOCYTES RELATIVE PERCENT: 25 %
MEAN CORPUSCULAR HEMOGLOBIN CONC: 32.6 g/dL (ref 31.5–35.7)
MEAN CORPUSCULAR HEMOGLOBIN: 30 pg (ref 26.6–33.0)
MEAN CORPUSCULAR VOLUME: 92 fL (ref 79–97)
MONOCYTES ABSOLUTE COUNT: 0.8 x10E3/uL (ref 0.1–0.9)
MONOCYTES RELATIVE PERCENT: 13 %
NEUTROPHILS ABSOLUTE COUNT: 3.6 x10E3/uL (ref 1.4–7.0)
NEUTROPHILS RELATIVE PERCENT: 59 %
PLATELET COUNT (LABCORP): 252 x10E3/uL (ref 150–450)
RED BLOOD CELL COUNT: 4.56 x10E6/uL (ref 3.77–5.28)
RED CELL DISTRIBUTION WIDTH: 12.6 % (ref 11.7–15.4)
WHITE BLOOD CELL COUNT: 6.1 x10E3/uL (ref 3.4–10.8)

## 2024-11-05 LAB — COMPREHENSIVE METABOLIC PANEL
ALBUMIN: 4.3 g/dL (ref 3.9–4.9)
ALKALINE PHOSPHATASE: 69 IU/L (ref 49–135)
ALT (SGPT): 20 IU/L (ref 0–32)
AST (SGOT): 18 IU/L (ref 0–40)
BILIRUBIN TOTAL (MG/DL) IN SER/PLAS: 0.7 mg/dL (ref 0.0–1.2)
BLOOD UREA NITROGEN: 14 mg/dL (ref 8–27)
BUN / CREAT RATIO: 17 (ref 12–28)
CALCIUM: 9.7 mg/dL (ref 8.7–10.3)
CHLORIDE: 102 mmol/L (ref 96–106)
CO2: 26 mmol/L (ref 20–29)
CREATININE: 0.82 mg/dL (ref 0.57–1.00)
EGFR: 77 mL/min/1.73
GLOBULIN, TOTAL: 2.4 g/dL (ref 1.5–4.5)
GLUCOSE: 99 mg/dL (ref 70–99)
POTASSIUM: 5.2 mmol/L (ref 3.5–5.2)
SODIUM: 140 mmol/L (ref 134–144)
TOTAL PROTEIN: 6.7 g/dL (ref 6.0–8.5)

## 2024-11-05 LAB — GAMMA GT: GAMMA GLUTAMYL TRANSFERASE: 54 IU/L (ref 0–60)

## 2024-11-05 LAB — MAGNESIUM: MAGNESIUM: 1.5 mg/dL — ABNORMAL LOW (ref 1.6–2.3)

## 2024-11-05 LAB — BILIRUBIN, DIRECT: BILIRUBIN DIRECT: 0.2 mg/dL (ref 0.00–0.40)

## 2024-11-05 LAB — PHOSPHORUS: PHOSPHORUS, SERUM: 3.2 mg/dL (ref 3.0–4.3)

## 2024-11-06 LAB — TACROLIMUS LEVEL: TACROLIMUS BLOOD: 6.7 ng/mL (ref 5.0–20.0)

## 2024-11-07 DIAGNOSIS — Z940 Kidney transplant status: Principal | ICD-10-CM

## 2024-11-13 DIAGNOSIS — Z940 Kidney transplant status: Principal | ICD-10-CM

## 2024-11-13 DIAGNOSIS — Z944 Liver transplant status: Principal | ICD-10-CM

## 2024-11-13 MED ORDER — TACROLIMUS XR 1 MG TABLET,EXTENDED RELEASE 24 HR
ORAL_TABLET | Freq: Every day | ORAL | 3 refills | 90.00000 days | Status: CP
Start: 2024-11-13 — End: 2025-11-13

## 2024-11-13 MED ORDER — TACROLIMUS XR 0.75 MG TABLET,EXTENDED RELEASE 24 HR
ORAL_TABLET | Freq: Every day | ORAL | 3 refills | 90.00000 days | Status: CP
Start: 2024-11-13 — End: 2025-11-13

## 2024-11-21 DIAGNOSIS — Z944 Liver transplant status: Principal | ICD-10-CM

## 2024-11-21 DIAGNOSIS — Z940 Kidney transplant status: Secondary | ICD-10-CM

## 2024-11-24 DIAGNOSIS — Z940 Kidney transplant status: Principal | ICD-10-CM

## 2024-11-24 DIAGNOSIS — R748 Abnormal levels of other serum enzymes: Secondary | ICD-10-CM

## 2024-12-06 LAB — COMPREHENSIVE METABOLIC PANEL
ALBUMIN: 4.6 g/dL (ref 3.9–4.9)
ALKALINE PHOSPHATASE: 65 [IU]/L (ref 49–135)
ALT (SGPT): 21 [IU]/L (ref 0–32)
AST (SGOT): 25 [IU]/L (ref 0–40)
BILIRUBIN TOTAL (MG/DL) IN SER/PLAS: 0.6 mg/dL (ref 0.0–1.2)
BLOOD UREA NITROGEN: 10 mg/dL (ref 8–27)
BUN / CREAT RATIO: 16 (ref 12–28)
CALCIUM: 9.3 mg/dL (ref 8.7–10.3)
CHLORIDE: 102 mmol/L (ref 96–106)
CO2: 22 mmol/L (ref 20–29)
CREATININE: 0.61 mg/dL (ref 0.57–1.00)
EGFR: 97 mL/min/{1.73_m2}
GLOBULIN, TOTAL: 2.3 g/dL (ref 1.5–4.5)
GLUCOSE: 98 mg/dL (ref 70–99)
POTASSIUM: 4.5 mmol/L (ref 3.5–5.2)
SODIUM: 141 mmol/L (ref 134–144)
TOTAL PROTEIN: 6.9 g/dL (ref 6.0–8.5)

## 2024-12-06 LAB — CBC W/ DIFFERENTIAL
BANDED NEUTROPHILS ABSOLUTE COUNT: 0 10*3/uL (ref 0.0–0.1)
BASOPHILS ABSOLUTE COUNT: 0.1 10*3/uL (ref 0.0–0.2)
BASOPHILS RELATIVE PERCENT: 1 %
EOSINOPHILS ABSOLUTE COUNT: 0.2 10*3/uL (ref 0.0–0.4)
EOSINOPHILS RELATIVE PERCENT: 3 %
HEMATOCRIT: 43.7 % (ref 34.0–46.6)
HEMOGLOBIN: 14.2 g/dL (ref 11.1–15.9)
IMMATURE GRANULOCYTES: 0 %
LYMPHOCYTES ABSOLUTE COUNT: 1.3 10*3/uL (ref 0.7–3.1)
LYMPHOCYTES RELATIVE PERCENT: 23 %
MEAN CORPUSCULAR HEMOGLOBIN CONC: 32.5 g/dL (ref 31.5–35.7)
MEAN CORPUSCULAR HEMOGLOBIN: 29.2 pg (ref 26.6–33.0)
MEAN CORPUSCULAR VOLUME: 90 fL (ref 79–97)
MONOCYTES ABSOLUTE COUNT: 0.7 10*3/uL (ref 0.1–0.9)
MONOCYTES RELATIVE PERCENT: 13 %
NEUTROPHILS ABSOLUTE COUNT: 3.4 10*3/uL (ref 1.4–7.0)
NEUTROPHILS RELATIVE PERCENT: 60 %
PLATELET COUNT (LABCORP): 264 10*3/uL (ref 150–450)
RED BLOOD CELL COUNT: 4.86 x10E6/uL (ref 3.77–5.28)
RED CELL DISTRIBUTION WIDTH: 13 % (ref 11.7–15.4)
WHITE BLOOD CELL COUNT: 5.5 10*3/uL (ref 3.4–10.8)
